# Patient Record
Sex: Female | Born: 1950
Health system: Southern US, Community
[De-identification: ages and names within clinical notes are randomized; demographics above are authoritative.]

## PROBLEM LIST (undated history)

## (undated) DIAGNOSIS — C801 Malignant (primary) neoplasm, unspecified: Secondary | ICD-10-CM

## (undated) DIAGNOSIS — S129XXA Fracture of neck, unspecified, initial encounter: Secondary | ICD-10-CM

## (undated) DIAGNOSIS — E785 Hyperlipidemia, unspecified: Secondary | ICD-10-CM

## (undated) DIAGNOSIS — F32A Depression, unspecified: Secondary | ICD-10-CM

## (undated) DIAGNOSIS — L509 Urticaria, unspecified: Secondary | ICD-10-CM

## (undated) DIAGNOSIS — N200 Calculus of kidney: Secondary | ICD-10-CM

## (undated) DIAGNOSIS — L02239 Carbuncle of trunk, unspecified: Secondary | ICD-10-CM

## (undated) DIAGNOSIS — T148XXA Other injury of unspecified body region, initial encounter: Secondary | ICD-10-CM

## (undated) DIAGNOSIS — Z8619 Personal history of other infectious and parasitic diseases: Secondary | ICD-10-CM

## (undated) DIAGNOSIS — E7800 Pure hypercholesterolemia, unspecified: Secondary | ICD-10-CM

## (undated) DIAGNOSIS — R000 Tachycardia, unspecified: Secondary | ICD-10-CM

## (undated) DIAGNOSIS — N390 Urinary tract infection, site not specified: Secondary | ICD-10-CM

## (undated) DIAGNOSIS — Z9889 Other specified postprocedural states: Secondary | ICD-10-CM

## (undated) DIAGNOSIS — J45909 Unspecified asthma, uncomplicated: Secondary | ICD-10-CM

## (undated) DIAGNOSIS — I10 Essential (primary) hypertension: Secondary | ICD-10-CM

## (undated) DIAGNOSIS — G4733 Obstructive sleep apnea (adult) (pediatric): Secondary | ICD-10-CM

## (undated) DIAGNOSIS — Z87442 Personal history of urinary calculi: Secondary | ICD-10-CM

## (undated) DIAGNOSIS — M1990 Unspecified osteoarthritis, unspecified site: Secondary | ICD-10-CM

## (undated) DIAGNOSIS — D649 Anemia, unspecified: Secondary | ICD-10-CM

## (undated) DIAGNOSIS — M5430 Sciatica, unspecified side: Secondary | ICD-10-CM

## (undated) DIAGNOSIS — L02229 Furuncle of trunk, unspecified: Secondary | ICD-10-CM

## (undated) DIAGNOSIS — R011 Cardiac murmur, unspecified: Secondary | ICD-10-CM

## (undated) DIAGNOSIS — E119 Type 2 diabetes mellitus without complications: Secondary | ICD-10-CM

## (undated) DIAGNOSIS — I499 Cardiac arrhythmia, unspecified: Secondary | ICD-10-CM

## (undated) DIAGNOSIS — G629 Polyneuropathy, unspecified: Secondary | ICD-10-CM

## (undated) DIAGNOSIS — M24411 Recurrent dislocation, right shoulder: Secondary | ICD-10-CM

## (undated) DIAGNOSIS — R350 Frequency of micturition: Secondary | ICD-10-CM

## (undated) DIAGNOSIS — R52 Pain, unspecified: Secondary | ICD-10-CM

## (undated) DIAGNOSIS — E039 Hypothyroidism, unspecified: Secondary | ICD-10-CM

## (undated) DIAGNOSIS — M797 Fibromyalgia: Secondary | ICD-10-CM

## (undated) DIAGNOSIS — E0500 Thyrotoxicosis with diffuse goiter without thyrotoxic crisis or storm: Secondary | ICD-10-CM

## (undated) DIAGNOSIS — F329 Major depressive disorder, single episode, unspecified: Secondary | ICD-10-CM

## (undated) DIAGNOSIS — J189 Pneumonia, unspecified organism: Secondary | ICD-10-CM

## (undated) DIAGNOSIS — G2581 Restless legs syndrome: Secondary | ICD-10-CM

## (undated) DIAGNOSIS — E669 Obesity, unspecified: Secondary | ICD-10-CM

## (undated) DIAGNOSIS — R112 Nausea with vomiting, unspecified: Secondary | ICD-10-CM

## (undated) DIAGNOSIS — G43909 Migraine, unspecified, not intractable, without status migrainosus: Secondary | ICD-10-CM

## (undated) DIAGNOSIS — M629 Disorder of muscle, unspecified: Secondary | ICD-10-CM

## (undated) DIAGNOSIS — G510 Bell's palsy: Secondary | ICD-10-CM

## (undated) HISTORY — DX: Essential (primary) hypertension: I10

## (undated) HISTORY — PX: CARPAL TUNNEL RELEASE: SHX101

## (undated) HISTORY — DX: Hypothyroidism, unspecified: E03.9

## (undated) HISTORY — PX: EYELID LACERATION REPAIR: SHX1564

## (undated) HISTORY — PX: JOINT REPLACEMENT: SHX530

## (undated) HISTORY — PX: ABDOMINAL HYSTERECTOMY: SHX81

## (undated) HISTORY — DX: Migraine, unspecified, not intractable, without status migrainosus: G43.909

## (undated) HISTORY — PX: APPENDECTOMY: SHX54

## (undated) HISTORY — DX: Tachycardia, unspecified: R00.0

## (undated) HISTORY — DX: Urinary tract infection, site not specified: N39.0

## (undated) HISTORY — DX: Fibromyalgia: M79.7

## (undated) HISTORY — PX: OTHER SURGICAL HISTORY: SHX169

## (undated) HISTORY — PX: KNEE ARTHROSCOPY: SUR90

## (undated) HISTORY — PX: TONSILLECTOMY: SUR1361

## (undated) HISTORY — DX: Type 2 diabetes mellitus without complications: E11.9

## (undated) HISTORY — DX: Furuncle of trunk, unspecified: L02.229

## (undated) HISTORY — DX: Urticaria, unspecified: L50.9

## (undated) HISTORY — PX: CATARACT EXTRACTION W/ INTRAOCULAR LENS  IMPLANT, BILATERAL: SHX1307

## (undated) HISTORY — DX: Obesity, unspecified: E66.9

## (undated) HISTORY — PX: HERNIA REPAIR: SHX51

## (undated) HISTORY — DX: Carbuncle of trunk, unspecified: L02.239

## (undated) HISTORY — DX: Anemia, unspecified: D64.9

## (undated) HISTORY — DX: Hyperlipidemia, unspecified: E78.5

## (undated) HISTORY — DX: Calculus of kidney: N20.0

## (undated) HISTORY — DX: Bell's palsy: G51.0

## (undated) HISTORY — DX: Obstructive sleep apnea (adult) (pediatric): G47.33

## (undated) HISTORY — PX: CHOLECYSTECTOMY: SHX55

---

## 1997-10-31 ENCOUNTER — Inpatient Hospital Stay (HOSPITAL_COMMUNITY): Admission: RE | Admit: 1997-10-31 | Discharge: 1997-11-05 | Payer: Self-pay | Admitting: General Surgery

## 1998-08-22 ENCOUNTER — Other Ambulatory Visit: Admission: RE | Admit: 1998-08-22 | Discharge: 1998-08-22 | Payer: Self-pay | Admitting: *Deleted

## 1999-11-03 ENCOUNTER — Other Ambulatory Visit: Admission: RE | Admit: 1999-11-03 | Discharge: 1999-11-03 | Payer: Self-pay | Admitting: *Deleted

## 2000-10-10 ENCOUNTER — Ambulatory Visit (HOSPITAL_BASED_OUTPATIENT_CLINIC_OR_DEPARTMENT_OTHER): Admission: RE | Admit: 2000-10-10 | Discharge: 2000-10-10 | Payer: Self-pay | Admitting: Otolaryngology

## 2001-05-09 ENCOUNTER — Other Ambulatory Visit: Admission: RE | Admit: 2001-05-09 | Discharge: 2001-05-09 | Payer: Self-pay | Admitting: *Deleted

## 2001-06-26 ENCOUNTER — Encounter: Admission: RE | Admit: 2001-06-26 | Discharge: 2001-09-24 | Payer: Self-pay | Admitting: Internal Medicine

## 2002-05-28 ENCOUNTER — Other Ambulatory Visit: Admission: RE | Admit: 2002-05-28 | Discharge: 2002-05-28 | Payer: Self-pay | Admitting: *Deleted

## 2003-06-21 ENCOUNTER — Other Ambulatory Visit: Admission: RE | Admit: 2003-06-21 | Discharge: 2003-06-21 | Payer: Self-pay | Admitting: Obstetrics & Gynecology

## 2006-01-18 ENCOUNTER — Inpatient Hospital Stay (HOSPITAL_COMMUNITY): Admission: RE | Admit: 2006-01-18 | Discharge: 2006-01-22 | Payer: Self-pay | Admitting: Orthopaedic Surgery

## 2006-02-15 ENCOUNTER — Encounter: Admission: RE | Admit: 2006-02-15 | Discharge: 2006-02-15 | Payer: Self-pay | Admitting: Orthopedic Surgery

## 2007-11-16 ENCOUNTER — Encounter: Admission: RE | Admit: 2007-11-16 | Discharge: 2007-11-16 | Payer: Self-pay | Admitting: Orthopedic Surgery

## 2007-12-21 ENCOUNTER — Ambulatory Visit (HOSPITAL_COMMUNITY): Admission: RE | Admit: 2007-12-21 | Discharge: 2007-12-21 | Payer: Self-pay | Admitting: Orthopaedic Surgery

## 2007-12-25 ENCOUNTER — Ambulatory Visit (HOSPITAL_COMMUNITY): Admission: AD | Admit: 2007-12-25 | Discharge: 2007-12-28 | Payer: Self-pay | Admitting: Orthopaedic Surgery

## 2007-12-26 ENCOUNTER — Ambulatory Visit: Payer: Self-pay | Admitting: Infectious Diseases

## 2008-01-25 ENCOUNTER — Ambulatory Visit: Payer: Self-pay | Admitting: Infectious Diseases

## 2008-01-25 ENCOUNTER — Telehealth: Payer: Self-pay | Admitting: Infectious Diseases

## 2008-01-25 DIAGNOSIS — M009 Pyogenic arthritis, unspecified: Secondary | ICD-10-CM | POA: Insufficient documentation

## 2008-01-25 DIAGNOSIS — A4901 Methicillin susceptible Staphylococcus aureus infection, unspecified site: Secondary | ICD-10-CM | POA: Insufficient documentation

## 2008-01-25 LAB — CONVERTED CEMR LAB
CRP: 3.4 mg/dL — ABNORMAL HIGH (ref <0.6)
Sed Rate: 133 mm/hr — ABNORMAL HIGH (ref 0–22)

## 2008-01-30 ENCOUNTER — Telehealth (INDEPENDENT_AMBULATORY_CARE_PROVIDER_SITE_OTHER): Payer: Self-pay | Admitting: *Deleted

## 2008-02-27 ENCOUNTER — Ambulatory Visit: Payer: Self-pay | Admitting: Infectious Diseases

## 2008-02-27 LAB — CONVERTED CEMR LAB
Basophils Absolute: 0 10*3/uL (ref 0.0–0.1)
Basophils Relative: 0 % (ref 0–1)
CRP: 0.4 mg/dL (ref <0.6)
Eosinophils Absolute: 0 10*3/uL (ref 0.0–0.7)
Eosinophils Relative: 0 % (ref 0–5)
HCT: 35.4 % — ABNORMAL LOW (ref 36.0–46.0)
Hemoglobin: 11.2 g/dL — ABNORMAL LOW (ref 12.0–15.0)
Lymphocytes Relative: 45 % (ref 12–46)
Lymphs Abs: 1.8 10*3/uL (ref 0.7–4.0)
MCHC: 31.6 g/dL (ref 30.0–36.0)
MCV: 83.3 fL (ref 78.0–100.0)
Monocytes Absolute: 0.4 10*3/uL (ref 0.1–1.0)
Monocytes Relative: 11 % (ref 3–12)
Neutro Abs: 1.8 10*3/uL (ref 1.7–7.7)
Neutrophils Relative %: 44 % (ref 43–77)
Platelets: 253 10*3/uL (ref 150–400)
RBC: 4.25 M/uL (ref 3.87–5.11)
RDW: 14.2 % (ref 11.5–15.5)
Sed Rate: 23 mm/hr — ABNORMAL HIGH (ref 0–22)
WBC: 4 10*3/uL (ref 4.0–10.5)

## 2008-04-30 ENCOUNTER — Ambulatory Visit: Payer: Self-pay | Admitting: Infectious Diseases

## 2008-08-29 ENCOUNTER — Ambulatory Visit: Payer: Self-pay | Admitting: Internal Medicine

## 2008-10-02 ENCOUNTER — Ambulatory Visit: Payer: Self-pay | Admitting: Gastroenterology

## 2008-10-02 DIAGNOSIS — R197 Diarrhea, unspecified: Secondary | ICD-10-CM | POA: Insufficient documentation

## 2008-10-02 DIAGNOSIS — R195 Other fecal abnormalities: Secondary | ICD-10-CM | POA: Insufficient documentation

## 2008-10-14 ENCOUNTER — Telehealth: Payer: Self-pay | Admitting: Gastroenterology

## 2008-10-17 ENCOUNTER — Ambulatory Visit (HOSPITAL_COMMUNITY): Admission: RE | Admit: 2008-10-17 | Discharge: 2008-10-17 | Payer: Self-pay | Admitting: Gastroenterology

## 2008-10-17 ENCOUNTER — Ambulatory Visit: Payer: Self-pay | Admitting: Gastroenterology

## 2009-02-18 ENCOUNTER — Encounter: Admission: RE | Admit: 2009-02-18 | Discharge: 2009-03-19 | Payer: Self-pay | Admitting: Internal Medicine

## 2010-04-21 NOTE — Miscellaneous (Signed)
Summary: [facility] PV  Clinical Lists Changes  Pt came for PV today.  She has had multiple abdominal surgeries.  Pt will need office visit with Dr. Christella Hartigan to determine if she should have colonoscopy at [facility].

## 2010-04-21 NOTE — Miscellaneous (Signed)
Summary: HIPAA Restrictions  HIPAA Restrictions   Imported By: Florinda Marker 01/25/2008 14:17:51  _____________________________________________________________________  External Attachment:    Type:   Image     Comment:   External Document

## 2010-04-21 NOTE — Procedures (Signed)
Summary: Colon prep/[facility]  Colon prep/[facility]   Imported By: Lester Morton 10/08/2008 10:35:45  _____________________________________________________________________  External Attachment:    Type:   Image     Comment:   External Document

## 2010-04-21 NOTE — Procedures (Signed)
Summary: Colonoscopy   Colonoscopy  Procedure date:  10/17/2008  Findings:      Location:  [facility].   COLONOSCOPY PROCEDURE REPORT  PATIENT:  [name], [name]  MR#:  [id number] BIRTHDATE:   December 22, 1950, 58 yrs. old   GENDER:   female ENDOSCOPIST:   Rachael Fee, MD Referred by: Rodrigo Ran, M.D. PROCEDURE DATE:  10/17/2008 PROCEDURE:  Colonoscopy, diagnostic ASA CLASS:   Class III INDICATIONS: FOBT positive stool  MEDICATIONS:    MAC sedation, administered by CRNA  DESCRIPTION OF PROCEDURE:   After the risks benefits and alternatives of the procedure were thoroughly explained, informed consent was obtained.  Digital rectal exam was performed and revealed no rectal masses.   The EC-[id number] ([id number]) endoscope was introduced through the anus and advanced to the cecum, which was identified by both the appendix and ileocecal valve, without limitations.  The quality of the prep was excellent, using MoviPrep.  The instrument was then slowly withdrawn as the colon was fully examined. <<PROCEDUREIMAGES>>  <<OLD IMAGES>>  FINDINGS:  A normal appearing cecum, ileocecal valve, and appendiceal orifice were identified. The ascending, hepatic flexure, transverse, splenic flexure, descending, sigmoid colon, and rectum appeared unremarkable (see image001 and image002).   Retroflexed views in the rectum revealed no abnormalities.    The scope was then withdrawn from the patient and the procedure completed.  COMPLICATIONS:   None  ENDOSCOPIC IMPRESSION:  1) Normal colon 2) No polyps or cancers  RECOMMENDATIONS:  1) You should continue follow current colorectal cancer screening guidelines for "routine risk" patients with a repeat colonoscopy in 10 years. I do not recommend other colon cancer screening prior to then (including stool tests for microscopic blood) unless new symptoms arise.   _______________________________ Rachael Fee, MD

## 2010-04-21 NOTE — Assessment & Plan Note (Signed)
Summary: hsfu shoulder infection need chrt   Chief Complaint:  hsfu.  History of Present Illness: 60 yo with h/o R TKR who underwent L knee arthroscopy then developed septic knee with MSSA> 10/1 had otpt arthroscopy then developed fevers and knee pain after 2 days. 10/5 I and D of knee that grew MSSA. D/c 10/7on ancef  Saw Dalldorf 1 wk ago and he thinks her leg is doing ok  but there is still some fluid on it.  No tap done. She thtinks her leg has good and bad days.  Is walking on it.  Much less swollen and much less painful since surgery.  No fevers chills    Updated Prior Medication List: TOPROL XL 100 MG XR24H-TAB (METOPROLOL SUCCINATE) 1 qd TRICOR 145 MG TABS (FENOFIBRATE) 1 qd SYNTHROID 75 MCG TABS (LEVOTHYROXINE SODIUM) 1 once daily METFORMIN HCL 1000 MG TABS (METFORMIN HCL) 1bid BYETTA 10 MCG PEN 10 MCG/0.04ML SOLN (EXENATIDE) 1 once daily ancef 1 gm q8 hrs  Current Allergies (reviewed today): ! CODEINE ! MORPHINE ! SULFA ! PHENERGAN  Past Medical History:    Septic L knee post arthroscopy    R TKR    OA    hyperlipidemai    htn    dm   Family History:    [place]  Social History:    no tob etoh drugs   Risk Factors:  Tobacco use:  never   Review of Systems       11 systems reviewed and negative except per HPI    Vital Signs:  Patient Profile:   60 Years Old Female Height:     61 inches (154.94 cm) Weight:      213.6 pounds (97.09 kg) BMI:     40.51 Temp:     98.3 degrees F (36.83 degrees C) oral Pulse rate:   84 / minute BP sitting:   149 / 80  (left arm)  Pt. in pain?   yes    Location:   knee    Intensity:   8    Type:       aching  Vitals Entered By: Starleen Arms CMA (January 25, 2008 11:44 AM)              Is Patient Diabetic? Yes Did you bring your meter with you today? No Nutritional Status BMI of > 30 = obese  Does patient need assistance? Functional Status Cook/clean, Shopping, Social activities  Ambulation Impaired:Risk for fall, Wheelchair     Physical Exam  General:     alert, well-developed, and well-nourished.   Mouth:     fair dentition.   Lungs:     normal respiratory effort and normal breath sounds.   Heart:     normal rate, regular rhythm, and no murmur.   Abdomen:     soft and non-tender.   Msk:     L knee has 2 well healed incision sites.  It is somewhat warm and has a small effusion but there is no tenderness to palpation or passive ROM Extremities:     no cce Neurologic:     alert & oriented X3 and cranial nerves II-XII intact.   Skin:     no rashes.      Impression & Recommendations:  Problem # 1:  PYOGENIC ARTHRITIS, LOWER LEG (ICD-711.06) She is now 4 wks s/p I and D and hascompleted 4 wks of IV ancef.  Her knee appears relatively benign and is non tender although there  is some small amount of effusion.  SHe has no systemic sxs.  She thinks her muscle aches in her leg are due to her PT activities.   Today we checked esr crp and the ESR was markedly elevated at 133 but crp only mildly elevated at 3.  Unfortunately there is no prior esr or crp dine.  I would liek to repeat the esr and crp by advance at home.   I think it would be best at this point to stop IV ancef and give tail of keflex for at least 2 wks.  Will d/w advance to pull the picc and I have given her an rx for keflex.  SHe will see orhto next week and then f/u with me  in 4 wks.  If effusion worsens would suggest drainage by ortho. Her updated medication list for this problem includes:    Keflex 750 Mg Caps (Cephalexin) .Marland Kitchen... Take one by mouth 4 times a day for 2 wks    Medications Added to Medication List This Visit: 1)  Toprol Xl 100 Mg Xr24h-tab (Metoprolol succinate) .Marland Kitchen.. 1 qd 2)  Tricor 145 Mg Tabs (Fenofibrate) .Marland Kitchen.. 1 qd 3)  Synthroid 75 Mcg Tabs (Levothyroxine sodium) .Marland Kitchen.. 1 once daily 4)  Metformin Hcl 1000 Mg Tabs (Metformin hcl) .Marland Kitchen.. 1bid  5)  Byetta 10 Mcg Pen 10 Mcg/0.26ml Soln (Exenatide) .Marland Kitchen.. 1 once daily 6)  Keflex 750 Mg Caps (Cephalexin) .... Take one by mouth 4 times a day for 2 wks  Other Orders: T-C-Reactive Protein ([id number]) T-Sed Rate (Automated) ([id number])   Patient Instructions: 1)  Please schedule a follow-up appointment in 4 weeks.   Prescriptions: KEFLEX 750 MG CAPS (CEPHALEXIN) take one by mouth 4 times a day for 2 wks  #56 x 1   Entered and Authorized by:   Clydie Braun MD   Signed by:   Clydie Braun MD on 01/25/2008   Method used:   Print then Give to Patient   RxID:   [id number]  ]

## 2010-04-21 NOTE — Progress Notes (Signed)
Summary: OK to stop IV ABX and pull PIC?  call [facility]  Phone Note From Pharmacy   Caller: Melissa @ [facility] [phone] Call For: Dr. Jean Rosenthal  Summary of Call: Dr. Sampson Goon, last day of IV antibiotic 01/25/08.  OK to stop antibiotic and pull PIC line?  Please return call to [phone].  Jennet Maduro RN  January 25, 2008 3:38 PM   Follow-up for Phone Call        Pharmacist called.  order given to d/c ancef and start  keflex.  Can pull  after checking esr crp Follow-up by: Clydie Braun MD,  January 26, 2008 9:55 AM

## 2010-04-21 NOTE — Progress Notes (Signed)
Summary: ESR and CBC to be drawn by [facility] today, PIC pulled 11//9/09  Phone Note Other Incoming   Call placed by: Kriste Basque, RN @ [facility] Call placed to: clinic Reason for Call: Discuss lab or test results Summary of Call: [facility] line was pulled yesterday without drawing ESR and CBC.  These will be drawn peripherally today.  Jennet Maduro RN  January 30, 2008 9:25 AM

## 2010-04-21 NOTE — Miscellaneous (Signed)
Summary: Waiver of Liability/[employer], industrial/product of Liability/[facility]   Imported By: Lester Palouse 10/08/2008 10:37:49  _____________________________________________________________________  External Attachment:    Type:   Image     Comment:   External Document

## 2010-06-28 LAB — GLUCOSE, CAPILLARY: Glucose-Capillary: 205 mg/dL — ABNORMAL HIGH (ref 70–99)

## 2010-08-04 NOTE — Op Note (Signed)
NAMECHARDONAY, SCRITCHFIELD                ACCOUNT NO.:  [id number]   MEDICAL RECORD NO.:  [id number]          PATIENT TYPE:  OIB   LOCATION:  5011                         FACILITY:  [facility]   PHYSICIAN:  Lubertha Basque. Dalldorf, M.D.DATE OF BIRTH:  [birth year]   DATE OF PROCEDURE:  12/25/2007  DATE OF DISCHARGE:                               OPERATIVE REPORT   PREOPERATIVE DIAGNOSIS:  Left knee infection.   POSTOPERATIVE DIAGNOSIS:  Left knee infection.   PROCEDURE:  Left knee arthroscopic incision and drainage.   ANESTHESIA:  General.   ATTENDING SURGEON:  Lubertha Basque. Jerl Santos, MD.   ASSISTANTLindwood Qua, PA   INDICATIONS FOR PROCEDURE:  The patient is a 60 year old woman about a  week from a left knee arthroscopy.  She is also diabetic.  She presented  to the office today with some increased pain and swelling.  We performed  an aspiration in the office, which returned some findings concerning for  infection.  She is offered arthroscopic I&D at this point.  Informed  operative consent was obtained after discussion of possible  complications of reaction to anesthesia and continued infection.   SUMMARY FINDINGS AND PROCEDURE:  Under general anesthesia, an  arthroscopic I&D was performed of the left knee.  She had some  significant pus, which we drained and again sent to the lab prior to  administering some perioperative IV vancomycin.  This was fully drained  followed by irrigation of the knee and placement of drains.   DESCRIPTION OF THE PROCEDURE:  The patient was in the operating suite  where general anesthetic was applied without difficulty.  She was  positioned supine and prepped and draped in normal sterile fashion.  We  used her old arthroscopic portals medial and lateral and placed the  arthroscopic trocar.  Some pus was drained and this was sent to  pathology for STAT Gram stain and cultures.  We then administered IV  vancomycin.  An arthroscopy of the knee was performed with a  thorough  irrigation.  She had the same findings as last week with some moderate  degenerative change.  Her meniscectomies looked stable.  We passed 6  liters of sterile saline through the knee followed by a bag of the  triple antibiotic solution.  I then placed large Hemovac drains medial  and lateral and sutured these in place.  An adaptic was applied to the  drain sites followed by dry gauze and loose Ace wrap.  Estimated blood  loss and intraoperative fluids obtained from anesthesia records.   DISPOSITION:  The patient was extubated in the operating room and taken  to the recovery room in stable addition.  She will be admitted to our  service for appropriate postop care to include antibiotic and pain  medication.  We will consult Infectious Disease about appropriate  antibiotic management for this will definitely involve some vancomycin.  We will have a PICC line placed in some point during this stay.      Lubertha Basque Jerl Santos, M.D.  Electronically Signed     PGD/MEDQ  D:  12/25/2007  T:  12/26/2007  Job:  [id number]

## 2010-08-04 NOTE — Op Note (Signed)
[name], [name]                ACCOUNT NO.:  [id number]   MEDICAL RECORD NO.:  [id number]          PATIENT TYPE:  AMB   LOCATION:  SDS                          FACILITY:  [facility]   PHYSICIAN:  Lubertha Basque. Dalldorf, M.D.DATE OF BIRTH:  1950-07-10   DATE OF PROCEDURE:  12/21/2007  DATE OF DISCHARGE:                               OPERATIVE REPORT   PREOPERATIVE DIAGNOSES:  1. Right shoulder rotator cuff tear.  2. Left knee chondromalacia patella.  3. Left knee torn lateral meniscus.   POSTOPERATIVE DIAGNOSES:  1. Right shoulder rotator cuff tear.  2. Left knee chondromalacia patella.  3. Left knee torn lateral meniscus.   PROCEDURES:  1. Right shoulder arthroscopic debridement.  2. Left knee partial lateral meniscectomy.  3. Left knee abrasion chondroplasty patellofemoral.   ANESTHESIA:  General and shoulder block.   ATTENDING SURGEON:  Lubertha Basque. Jerl Santos, MD   ASSISTANT:  Lindwood Qua, PA   INDICATION FOR PROCEDURE:  The patient is a 60 year old woman with a  long history of right shoulder and to a lesser degree left knee pain.  She is about 3 years from rotator cuff repair on the right.  This has  gone bad over the last 6 months.  She has failed several subacromial  injections and a therapy program.  By MRI scan, she has a new rotator  cuff tear.  She is offered repair.  She has also had trouble with the  knee despite injections and pills.  We have agreed to perform an  arthroscopy there at the same sitting.  Informed operative consent was  obtained after discussion of possible complications of reaction to  anesthesia and infection.   SUMMARY/FINDINGS/PROCEDURE:  Under general anesthesia and a block, a  right shoulder arthroscopy was performed.  She had a very large  retracted rotator cuff tear with poor tissues.  We elected to debride  this back to the level of the glenoid.  She had no new bony findings and  appeared to have adequate decompression, so no additional  bony work was  done.  She also has degenerative changes across the humeral head, grade  4 in small areas.  At the knee, we found a posterior horn lateral  meniscus addressed with 5% partial lateral meniscectomy.  She also has  advanced degenerative change of patellofemoral, grade 4.  A  chondroplasty was done here with abrasion to bleeding bone in some tiny  areas.   DESCRIPTION OF PROCEDURE:  The patient was taken to operating suite  where a general anesthetic was applied with moderate difficulty under  scope guidance.  She was then positioned in a beach chair position and  prepped and draped in normal sterile fashion.  Should be noted that she  had a regional block applied to the right arm in the preanesthesia area.  After the administration of IV Kefzol, an arthroscopy of the right  shoulder was performed through a total of 3 portals.  Findings were as  noted above.  There were some early degenerative changes in broad areas  and some advanced change on the humeral head in  small area.  The biceps  tendon appeared intact.  Rotator cuff was torn and retracted and the  tissues were not very viable.  We elected to debride the level of  glenoid, so we did leave the anterior and posterior sleeves of tissue.  No additional bony work was required.  The shoulder was thoroughly  irrigated followed by reapproximation of portals loosely with nylon.  Adaptic was applied followed by dry gauze and tape.  We then turned our  attention to the knee.  This had already had been prepped and draped at  the beginning of the case.  We performed an arthroscopy through a total  of 2 portals.  Findings were as noted above.  Procedure consisted of  abrasion chondroplasty patellofemoral with a partial lateral  meniscectomy done, which shaver removing about 5% of this tissue to a  stable rim.  The medial compartment was benign.  This knee was irrigated  followed by placement of some Marcaine with epinephrine and  morphine.  Adaptic was placed over the portals followed by dry gauze and a loose  Ace wrap.  Estimated blood loss and intraoperative fluids obtained from  the anesthesia records.   DISPOSITION:  The patient was extubated in the operating room and taken  to recovery room in stable addition.  She is to go home same-day and to  follow up in the office next week.      Lubertha Basque Jerl Santos, M.D.  Electronically Signed     PGD/MEDQ  D:  12/21/2007  T:  12/22/2007  Job:  [id number]

## 2010-08-07 NOTE — Discharge Summary (Signed)
NAMEEVERLEY, EVORA                ACCOUNT NO.:  [id number]   MEDICAL RECORD NO.:  [id number]          PATIENT TYPE:  INP   LOCATION:  5011                         FACILITY:  [facility]   PHYSICIAN:  Lubertha Basque. Dalldorf, M.D.DATE OF BIRTH:  1950-06-20   DATE OF ADMISSION:  12/25/2007  DATE OF DISCHARGE:  12/28/2007                               DISCHARGE SUMMARY   ADMITTING DIAGNOSES:  1. Infected left knee status post arthroscopy, December 21, 2007.  2. Diabetes mellitus.  3. Obesity.  4. Hypertension.   DISCHARGE DIAGNOSES:  1. Infected left knee status post arthroscopy, December 21, 2007.  2. Diabetes mellitus.  3. Obesity.  4. Hypertension.   OPERATION:  Incision and drainage, left knee for infection.   BRIEF HISTORY:  Ms. Malkowski is a 60 year old white female patient well-  known to our practice who prior to this admission had undergone a knee  arthroscopy and shoulder arthroscopy on December 21, 2007.  The left knee  postoperatively after the scope began to develop increasing redness and  discomfort which are in the office aspirated.  It was noted to be  infected at that time and she was admitted to the hospital for I and D,  and then consultation from Infectious Disease.   PERTINENT LABORATORY DATA AND X-RAY FINDINGS:  There was a chest x-ray  that was done for displacement of the PICC line.  WBCs 8.1, hemoglobin  11.1, and platelets 230.  Sodium 131, potassium 3.1, glucose 198 which  did fluctuate, BUN 5, and creatinine 0.4.  Hemoglobin A1c 8.1.  Gram-  stain showed rare Gram-positive cocci in pairs.  Moderate staph aureus  was her culture.  Sensitivities per the chart.   COURSE IN THE HOSPITAL:  She was admitted and taken to the operating  room for I and D postoperatively.  An Infectious Disease consult was  done by Dr. Darlina Sicilian and put her on the appropriate antibiotics.  Postoperatively, she had decreasing temperature with back to the normal  range.  We left 2 large-bore drains  in her knee for 2-3 days postop.  They were removed.  Blood sugars were monitored and treated accordingly  and a PICC line was put in for IV antibiotics which we processed for  approximately 4 weeks and then later p.o.  Foley catheter was  discontinued, dressing was changed, drains were removed, wound was noted  to be benign, and she progressed and was discharged home.   CONDITION ON DISCHARGE:  Improved.   FOLLOWUP:  She will be on a low-sodium heart-healthy diet.  May  weightbear as tolerated, crutches or walker.  Change her dressing daily.  Return to Dr. Jerl Santos in 7 days.  She will be on an 81 mg aspirin, IV  home Ancef  protocol as regulated by Infectious Disease.  Any sign of increasing  infection, redness, or temperature elevation is to call.  She was also  kept on her home medication list which includes Byetta, metformin,  hormone replacement, metoprolol, Synthroid, Niaspan, Vytorin, generic  Prozac, and oxycodone for pain.      Lindwood Qua, P.A.  Lubertha Basque Jerl Santos, M.D.  Electronically Signed    MC/MEDQ  D:  02/01/2008  T:  02/01/2008  Job:  [id number]

## 2010-08-07 NOTE — Discharge Summary (Signed)
[name], [name]                ACCOUNT NO.:  [id number]   MEDICAL RECORD NO.:  [id number]          PATIENT TYPE:  INP   LOCATION:  5025                         FACILITY:  [facility]   PHYSICIAN:  Lubertha Basque. Dalldorf, M.D.DATE OF BIRTH:  Feb 05, 1951   DATE OF ADMISSION:  01/18/2006  DATE OF DISCHARGE:  01/22/2006                               DISCHARGE SUMMARY   ADMITTING DIAGNOSES:  1. Right knee end-stage degenerative joint disease.  2. Non-insulin-dependent diabetes mellitus.  3. Hypothyroidism.  4. History of tachycardia.  5. Diagnosis of hypertension.  6. Esophageal reflux.   DISCHARGE DIAGNOSIS:  1. Right knee end-stage degenerative joint disease.  2. Noninsulin-dependent diabetes mellitus.  3. Hypothyroidism.  4. History of tachycardia.  5. Diagnosis of hypertension.  6. Esophageal reflux.   OPERATIONS:  Right total knee replacement.   BRIEF HISTORY:  This is a 60 year old female well-known to our practice  complaining of increasing right knee pain, now pain with every step,  trouble sleeping at nighttime.  X-rays reveal end-stage DJD in multiple  compartments.  She has also failed oral anti-inflammatory medications  and multiple corticosteroid injections and Synvisc injections.  Discussed treatment options with her, that being total knee replacement  with the risks of anesthesia, infection, DVT and possible death.   PERTINENT LABORATORY AND X-RAY FINDINGS:  WBC is 5.7, hemoglobin 11.2,  hematocrit 31.8, platelets 218.  Serial INRs were performed, as she was  on low-dose Coumadin protocol.  Sodium 139, potassium 3.5, glucose 153,  BUN 4, creatinine 0.5.  Hemoglobin A1c of 6.4 postoperatively.   Postoperatively she was kept on her home medications which will be  outlined at the end of this report.  She was given an IV of lactated  Ringer's, IV Ancef a gram q.8h. x3 doses, Coumadin and Lovenox protocol  as per pharmacy for DVT prophylaxis.  CPM machine was used, 0-50,  advance as tolerated.  Incentive spirometry, knee-high TEDs.  Physical  therapy to be out of bed and weightbearing as tolerated, as well.   The first day postoperatively, her blood pressure was 93/53, temperature  was 98.  Lungs were clear.  Abdomen was soft.  Hemoglobin 11.8, glucose  130.  The dressing was dry.  The wound was benign.  Physical therapy was  started to help her with out-of- bed weightbearing as tolerated.   The second day postoperatively, her drains were pulled and her dressing  was changed.  Her wound was benign without sign of infection.  Lungs  continued to be clear.  She did spike a temperature to 101.  The  pharmacy was regulating Coumadin protocol along with Lovenox.   Day three postoperatively, she was progressing well with physical  therapy and was able to be discharged on January 22, 2006.   CONDITION ON DISCHARGE:  Improved.   DISCHARGE MEDICATIONS:  1. She will be on a Coumadin dose regulated by pharmacy for 3 weeks.  2. Percocet for pain one or two every 4-6 hours.  3. She will continue on Synthroid 0.0075 one a day.  4. Vytorin 10/20 one a day.  5. Florice  5000 daily.  6. Her hormone replacement therapy 1 mg which is estradiol 1 mg daily.  7. Cyclobenzaprine as needed for spasm.  8. Fluoxetine 10 mg at night.  9. Prilosec 20 one at night time.  10.Clonazepam 0.5 p.r.n.  (Naprosyn discontinued.)   DIET:  The diabetic diet that she was on prior to coming to the  hospital.   WOUND CARE:  May change her dressing on a daily basis.  Any sign of  infection to call our office at [phone].   [facility] is arranging home therapy, as well as home pro times-  INRs and Coumadin regulation.  Any sign of infection to call the office,  will see her in 10 days, [phone].      [name], P.A.      Lubertha Basque Jerl Santos, M.D.  Electronically Signed    MC/MEDQ  D:  03/01/2006  T:  03/01/2006  Job:  [id number]

## 2010-08-07 NOTE — Op Note (Signed)
[name], [name]                ACCOUNT NO.:  [id number]   MEDICAL RECORD NO.:  [id number]          PATIENT TYPE:  INP   LOCATION:  2899                         FACILITY:  [facility]   PHYSICIAN:  Lubertha Basque. Dalldorf, M.D.DATE OF BIRTH:  03-Sep-1950   DATE OF PROCEDURE:  01/18/2006  DATE OF DISCHARGE:                                 OPERATIVE REPORT   PREOPERATIVE DIAGNOSIS:  Right knee degenerative arthritis.   POSTOPERATIVE DIAGNOSIS:  Right knee degenerative arthritis.   PROCEDURE:  Right total knee replacement.   ANESTHESIA:  General and block.   SURGEON:  Lubertha Basque. Jerl Santos, M.D.   ASSISTANT:  Lindwood Qua, P.A.   INDICATIONS FOR PROCEDURE:  The patient is a 60 year old woman with a long  history of bilateral knee arthritis.  On the right she is status post an  arthroscopy some time ago, which revealed advanced degenerative change.  She  has failed all oral anti-inflammatories and injectables, and has pain which  limits her ability to walk and rest.  She is offered a knee replacement.  Informed operative consent was obtained after a discussion of the possible  complications of, reaction to anesthesia, infection, DVT, PE and death.  The  importance of rehabilitation protocol after this type of surgery was also  stressed with the patient.   SUMMARY/FINDINGS:  Under general anesthesia and a block, through an anterior  longitudinal incision, a knee replacement was performed.  She had good bone  quality and advanced degenerative change in all three compartments.  She had  a mild varus deformity.  This was addressed with a DePuy system.  On the  tibia we placed a 2.5 MBT revision-type stem due to her weight.  On the  femur we placed a standard component and on the patella we placed a size 35  all polyethylene component.  The spacer was the 12.5 deep dish variety.  I  included antibiotic in the cement.  Bryna Colander, P.A. assisted throughout  and was invaluable to the completion  of the case, in that he helped retract  and position, while I performed the operation.  He also closed  simultaneously, to help minimize OR time.   DESCRIPTION OF PROCEDURE:  The patient was taken to the operating suite  where a general anesthetic was applied without difficulty.  She was also  given a block in the preanesthesia area.  She was positioned supine and  prepped and draped in a normal sterile fashion.  After the administration of  IV Kefzol, the right leg was elevated, exsanguinated and a tourniquet  inflated about the thigh.  A longitudinal anterior incision was made with  dissection down to the extensor mechanism.  All appropriate and anti-  infective measures were used, including the preoperative IV antibiotic,  Betadine-impregnated drape and closed hooded exhaust systems for each member  of the surgical team.  A medial parapatellar incision was made.  The kneecap  was flipped and the knee flexed.  Some residual meniscal tissues were  removed.  She had advanced degenerative change in all three compartments,  but good bone quality.  We  placed an extramedullary guide on the tibia and  made a cut roughly flat, with no tilt.  We then placed he intramedullary  guide on the femur to make the anterior and posterior cuts, creating a  flexion gap of about 12.5 mm.  A second intramedullary guide was placed on  the femur, making a distal cut, creating an equal extension gap of 12.5 mm.  The femur sized to a standard and the tibia to a 2.5, and the appropriate  guides were placed and utilized.  We did place the MBT guide and drilled for  a stem on the tibia.  The patella was cut down in thickness from 19 to about  13, and sized to a 35 with an appropriate guide placed and utilized.  A  trial reduction was done with these components.  The knee easily came to  full extension and flexed well.  The patella tracked well.  The trial  components were removed, followed by pulsatile lavage  irrigation of all  three cut bony surfaces.  Cement was mixed including Zinacef, and this was  pressurized  on all three bones.  The aforementioned DePuy components were  then placed.  Excess cement was trimmed and pressure was held on the  components until the cement hardened.  The tourniquet was deflated and a  small amount of bleeding was easily controlled with Bovie cautery.  We did  place some thrombin spray inside the wound to help control bleeding.  A  drain was placed, exiting superolaterally, followed by reapproximation of  the extensor mechanism with #1 Vicryl in an interrupted fashion.  The  subcutaneous tissues were reapproximated with #0 and #2-0 undyed Vicryl, and  the skin was closed with nylon.  Adaptic was applied to the wound, followed  by dry gauze and a loose Ace wrap.  The knee flexed to 120 at the end of the  case against gravity.  The estimated blood loss was 100 mL and  intraoperative fluids can be obtained from the anesthesia records.  The  tourniquet time was just over one  hour and can also be obtained from the  anesthesia records.   DISPOSITION:  The patient was extubated in the operating room and taken to  the recovery room in stable condition.  She was to be admitted to the  orthopedic surgery service for appropriate postop care, to include  perioperative antibiotics and Coumadin plus Lovenox for DVT prophylaxis.      Lubertha Basque Jerl Santos, M.D.  Electronically Signed     PGD/MEDQ  D:  01/18/2006  T:  01/18/2006  Job:  [id number]

## 2010-12-21 LAB — CBC
HCT: 36
HCT: 32.1 — ABNORMAL LOW
HCT: 33.7 — ABNORMAL LOW
HCT: 36
HCT: 40.9
Hemoglobin: 12.3
Hemoglobin: 11.1 — ABNORMAL LOW
Hemoglobin: 11.4 — ABNORMAL LOW
Hemoglobin: 12.4
Hemoglobin: 13.7
MCHC: 34.1
MCHC: 33.6
MCHC: 33.8
MCHC: 34.4
MCHC: 34.5
MCV: 86.3
MCV: 85.2
MCV: 86.4
MCV: 87.2
MCV: 87.6
Platelets: 202
Platelets: 197
Platelets: 202
Platelets: 209
Platelets: 230
RBC: 4.17
RBC: 3.77 — ABNORMAL LOW
RBC: 3.9
RBC: 4.11
RBC: 4.69
RDW: 13.4
RDW: 13.5
RDW: 13.5
RDW: 13.7
RDW: 13.9
WBC: 4.2
WBC: 6.7
WBC: 8.1
WBC: 9.5
WBC: 9.9

## 2010-12-21 LAB — BASIC METABOLIC PANEL
BUN: 11
BUN: 15
BUN: 4 — ABNORMAL LOW
BUN: 5 — ABNORMAL LOW
BUN: 9
CO2: 29
CO2: 25
CO2: 27
CO2: 27
CO2: 28
Calcium: 9.6
Calcium: 8.6
Calcium: 8.7
Calcium: 8.9
Calcium: 9.6
Chloride: 99
Chloride: 102
Chloride: 97
Chloride: 97
Chloride: 98
Creatinine, Ser: 0.54
Creatinine, Ser: 0.38 — ABNORMAL LOW
Creatinine, Ser: 0.4
Creatinine, Ser: 0.53
Creatinine, Ser: 0.53
GFR calc Af Amer: >60
GFR calc Af Amer: >60
GFR calc Af Amer: >60
GFR calc Af Amer: >60
GFR calc Af Amer: >60
GFR calc non Af Amer: >60
GFR calc non Af Amer: >60
GFR calc non Af Amer: >60
GFR calc non Af Amer: >60
GFR calc non Af Amer: >60
Glucose, Bld: 226 — ABNORMAL HIGH
Glucose, Bld: 194 — ABNORMAL HIGH
Glucose, Bld: 198 — ABNORMAL HIGH
Glucose, Bld: 207 — ABNORMAL HIGH
Glucose, Bld: 230 — ABNORMAL HIGH
Potassium: 4.2
Potassium: 3.1 — ABNORMAL LOW
Potassium: 3.8
Potassium: 3.8
Potassium: 4
Sodium: 136
Sodium: 131 — ABNORMAL LOW
Sodium: 133 — ABNORMAL LOW
Sodium: 135
Sodium: 136

## 2010-12-21 LAB — GRAM STAIN

## 2010-12-21 LAB — ANAEROBIC CULTURE

## 2010-12-21 LAB — HEMOGLOBIN A1C
Hgb A1c MFr Bld: 8.1 — ABNORMAL HIGH
Mean Plasma Glucose: 186

## 2010-12-21 LAB — GLUCOSE, CAPILLARY
Glucose-Capillary: 149 — ABNORMAL HIGH
Glucose-Capillary: 168 — ABNORMAL HIGH
Glucose-Capillary: 175 — ABNORMAL HIGH
Glucose-Capillary: 175 — ABNORMAL HIGH
Glucose-Capillary: 175 — ABNORMAL HIGH
Glucose-Capillary: 185 — ABNORMAL HIGH
Glucose-Capillary: 186 — ABNORMAL HIGH
Glucose-Capillary: 193 — ABNORMAL HIGH
Glucose-Capillary: 199 — ABNORMAL HIGH
Glucose-Capillary: 204 — ABNORMAL HIGH
Glucose-Capillary: 207 — ABNORMAL HIGH
Glucose-Capillary: 224 — ABNORMAL HIGH
Glucose-Capillary: 246 — ABNORMAL HIGH
Glucose-Capillary: 300 — ABNORMAL HIGH

## 2010-12-21 LAB — CULTURE, ROUTINE-ABSCESS

## 2011-01-20 DIAGNOSIS — K469 Unspecified abdominal hernia without obstruction or gangrene: Secondary | ICD-10-CM | POA: Insufficient documentation

## 2011-04-19 ENCOUNTER — Other Ambulatory Visit: Payer: Self-pay | Admitting: Orthopaedic Surgery

## 2011-04-19 DIAGNOSIS — L02619 Cutaneous abscess of unspecified foot: Secondary | ICD-10-CM

## 2011-04-20 ENCOUNTER — Inpatient Hospital Stay (HOSPITAL_COMMUNITY)
Admission: RE | Admit: 2011-04-20 | Discharge: 2011-04-20 | Disposition: A | Discharge status: Home / Self Care | Payer: BC Managed Care – PPO | Source: Ambulatory Visit

## 2011-04-20 ENCOUNTER — Ambulatory Visit (HOSPITAL_COMMUNITY)
Admission: RE | Admit: 2011-04-20 | Discharge: 2011-04-20 | Disposition: A | Discharge status: Home / Self Care | Payer: BC Managed Care – PPO | Source: Ambulatory Visit | Attending: Orthopaedic Surgery | Admitting: Orthopaedic Surgery

## 2011-04-20 ENCOUNTER — Other Ambulatory Visit: Payer: Self-pay | Admitting: Orthopaedic Surgery

## 2011-04-20 DIAGNOSIS — L02619 Cutaneous abscess of unspecified foot: Secondary | ICD-10-CM | POA: Insufficient documentation

## 2011-04-20 DIAGNOSIS — L03119 Cellulitis of unspecified part of limb: Secondary | ICD-10-CM

## 2011-04-20 LAB — BUN: BUN: 47 mg/dL — ABNORMAL HIGH (ref 6–23)

## 2011-04-20 LAB — CREATININE, SERUM
Creatinine, Ser: 1.26 mg/dL — ABNORMAL HIGH (ref 0.50–1.10)
GFR calc Af Amer: 53 mL/min — ABNORMAL LOW (ref >90)
GFR calc non Af Amer: 46 mL/min — ABNORMAL LOW (ref >90)

## 2011-04-20 MED ORDER — VANCOMYCIN HCL IN DEXTROSE 1-5 GM/200ML-% IV SOLN
1000.0000 mg | Freq: Once | INTRAVENOUS | Status: AC
  Administered 2011-04-20: 1000 mg via INTRAVENOUS
  Filled 2011-04-20: qty 200

## 2011-04-20 MED ORDER — SODIUM CHLORIDE 0.9 % IV SOLN
Freq: Once | INTRAVENOUS | Status: AC
  Administered 2011-04-20: 12:00:00 via INTRAVENOUS

## 2011-04-20 MED ORDER — HEPARIN SOD (PORK) LOCK FLUSH 100 UNIT/ML IV SOLN
250.0000 [IU] | Freq: Once | INTRAVENOUS | Status: AC
  Administered 2011-04-20: 250 [IU] via INTRAVENOUS

## 2011-04-20 MED ORDER — HEPARIN SOD (PORK) LOCK FLUSH 100 UNIT/ML IV SOLN
INTRAVENOUS | Status: AC
  Administered 2011-04-20: 250 [IU] via INTRAVENOUS
  Filled 2011-04-20: qty 5

## 2011-04-20 NOTE — Procedures (Signed)
Rt arm SL PICC 39cm Tip in SVC/RA Ready to use  Pt tolerated well Dx: cellulitis PICC for antibiotics

## 2011-04-26 ENCOUNTER — Other Ambulatory Visit: Payer: Self-pay | Admitting: Orthopaedic Surgery

## 2011-04-26 DIAGNOSIS — M79672 Pain in left foot: Secondary | ICD-10-CM

## 2011-04-27 ENCOUNTER — Ambulatory Visit
Admission: RE | Admit: 2011-04-27 | Discharge: 2011-04-27 | Disposition: A | Discharge status: Home / Self Care | Payer: BC Managed Care – PPO | Source: Ambulatory Visit | Attending: Orthopaedic Surgery | Admitting: Orthopaedic Surgery

## 2011-04-27 DIAGNOSIS — M79672 Pain in left foot: Secondary | ICD-10-CM

## 2011-04-27 MED ORDER — GADOBENATE DIMEGLUMINE 529 MG/ML IV SOLN: 10.0000 mL | Freq: Once | INTRAVENOUS | Status: AC | PRN

## 2011-05-01 ENCOUNTER — Other Ambulatory Visit: Payer: BC Managed Care – PPO

## 2011-09-28 ENCOUNTER — Other Ambulatory Visit: Payer: BC Managed Care – PPO

## 2011-09-28 ENCOUNTER — Encounter: Payer: Self-pay | Admitting: Gastroenterology

## 2011-09-28 ENCOUNTER — Ambulatory Visit (INDEPENDENT_AMBULATORY_CARE_PROVIDER_SITE_OTHER): Payer: BC Managed Care – PPO | Admitting: Gastroenterology

## 2011-09-28 VITALS — BP 124/60 | HR 72 | Ht 61.0 in | Wt 237.8 lb

## 2011-09-28 DIAGNOSIS — D649 Anemia, unspecified: Secondary | ICD-10-CM

## 2011-09-28 DIAGNOSIS — K625 Hemorrhage of anus and rectum: Secondary | ICD-10-CM

## 2011-09-28 NOTE — Patient Instructions (Addendum)
You will have labs checked today in the basement lab.  Please head down after you check out with the front desk  (celiac sprue panel). Will likely need EGD +/- colonoscopy depending on results of the blood tests (would be with MAC sedation such as previously). A copy of this information will be made available to Dr. Waynard Edwards.

## 2011-09-28 NOTE — Progress Notes (Signed)
Review of pertinent gastrointestinal problems: 1. routine risk for colon cancer: Colonoscopy July 2010 was normal. Recall colonoscopy at 10 year interval was recommended. This colonoscopy was done for Hemoccult-positive stool, done with MAC sedation    HPI: This is a   very pleasant 61 year old woman whom I last saw about 3 years ago.  Recent blood work shows hemoglobin 10.0, MCV 78, TIBC was elevated, serum iron was low normal, transferrin was high, her ferritin was also low normal. She was Hemoccult-positive 3 out of 3 tests.  She never sees any blood in stool.  No nose bleeds.  She did have some microsopc blood in urine, known k stone.  Her bowels have "always been weird."  Stays constipated.  Can be incontinent at other extreme.  Does have anal fissures with her constipation at times.  Has had some severe abd pains, recently, associated with diahrea.  Diclofenac and 2 baby asa daily.  Takes prilosec daily.  Overall stable weight.  No gerd symptoms, no dsyphagia.   Past Medical History  Diagnosis Date  . Urticaria   . Fibromyalgia   . Tachycardia   . OSA (obstructive sleep apnea)   . Migraine   . DM (diabetes mellitus)   . Hyperlipidemia   . Bell's palsy   . Hypertension   . Hypothyroidism   . Obesity   . UTI (urinary tract infection)   . Nephrolithiasis   . Anemia   . Carbuncle and furuncle of trunk     Past Surgical History  Procedure Date  . Appendectomy   . Abdominal hysterectomy   . Cholecystectomy   . Cesarean section   . Carpal tunnel release   . Knee arthroscopy   . Shoulder arthroscopy     Current Outpatient Prescriptions  Medication Sig Dispense Refill  . aspirin 81 MG tablet Take 81 mg by mouth daily.      . insulin aspart (NOVOLOG) 100 UNIT/ML injection Inject 15 Units into the skin 3 (three) times daily before meals.      . insulin detemir (LEVEMIR) 100 UNIT/ML injection Inject 40 Units into the skin 2 (two) times daily.      Marland Kitchen losartan (COZAAR)  50 MG tablet Take 50 mg by mouth daily.      . metFORMIN (GLUCOPHAGE) 1000 MG tablet Take 1,000 mg by mouth 2 (two) times daily with a meal.        Allergies as of 09/28/2011 - Review Complete 09/28/2011  Allergen Reaction Noted  . Codeine  01/25/2008  . Morphine  01/25/2008  . Promethazine hcl  01/25/2008  . Sulfonamide derivatives  01/25/2008    Family History  Problem Relation Age of Onset  . Diabetes Father   . Osteoarthritis Father   . Heart disease Father   . Ulcers Father   . Stroke Mother     History   Social History  . Marital Status: Married    Spouse Name: N/A    Number of Children: 3  . Years of Education: N/A   Occupational History  . disabled    Social History Main Topics  . Smoking status: Never Smoker   . Smokeless tobacco: Never Used  . Alcohol Use: No  . Drug Use: No  . Sexually Active: Not on file   Other Topics Concern  . Not on file   Social History Narrative  . No narrative on file      Physical Exam: BP 124/60  Pulse 72  Ht 5\' 1"  (1.549 m)  Wt 237 lb 12.8 oz (107.865 kg)  BMI 44.93 kg/m2 Constitutional: generally well-appearing Psychiatric: alert and oriented x3 Abdomen: soft, nontender, nondistended, no obvious ascites, no peritoneal signs, normal bowel sounds     Assessment and plan: 61 y.o. female with iron deficiency, microcytic anemia. Colonoscopy 3 years ago, she does take NSAIDs daily.  Celiac sprue can cause iron deficiency anemia however should not be responsible for microscopic blood in the stool. We will test her for celiac sprue with a blood panel. I think likely she is going to need an upper endoscopy and if that is unrevealing then she will need repeat colonoscopy given her iron deficiency anemia and persistent Hemoccult-positive stools. She explained to me that this could cause quite a bit it out a pocket expense unless it is done as a "preventative exam."  I do not think that that will be the case since she has  symptoms which we are trying to explain. We are going to put her in touch with our insurance expert here at [place] to help with her questions.

## 2011-09-29 LAB — CELIAC PANEL 10
Endomysial Screen: NEGATIVE
Gliadin IgA: 3.9 U/mL (ref <20)
Gliadin IgG: 4.5 U/mL (ref <20)
IgA: 120 mg/dL (ref 69–380)
Tissue Transglut Ab: 3.4 U/mL (ref <20)
Tissue Transglutaminase Ab, IgA: 3.8 U/mL (ref <20)

## 2012-03-22 HISTORY — PX: LAPAROSCOPIC GASTRIC SLEEVE RESECTION: SHX5895

## 2012-07-20 DIAGNOSIS — T148XXA Other injury of unspecified body region, initial encounter: Secondary | ICD-10-CM

## 2012-07-20 HISTORY — DX: Other injury of unspecified body region, initial encounter: T14.8XXA

## 2012-08-15 ENCOUNTER — Inpatient Hospital Stay (HOSPITAL_COMMUNITY)
Admission: EM | Admit: 2012-08-15 | Discharge: 2012-08-18 | DRG: 552 | Disposition: A | Discharge status: Home Health | Payer: No Typology Code available for payment source | Attending: General Surgery | Admitting: General Surgery

## 2012-08-15 ENCOUNTER — Encounter (HOSPITAL_COMMUNITY): Payer: Self-pay

## 2012-08-15 ENCOUNTER — Emergency Department (HOSPITAL_COMMUNITY): Payer: No Typology Code available for payment source

## 2012-08-15 DIAGNOSIS — H5704 Mydriasis: Secondary | ICD-10-CM | POA: Diagnosis present

## 2012-08-15 DIAGNOSIS — S060X0A Concussion without loss of consciousness, initial encounter: Secondary | ICD-10-CM | POA: Diagnosis present

## 2012-08-15 DIAGNOSIS — S069X1A Unspecified intracranial injury with loss of consciousness of 30 minutes or less, initial encounter: Secondary | ICD-10-CM

## 2012-08-15 DIAGNOSIS — R51 Headache: Secondary | ICD-10-CM | POA: Diagnosis not present

## 2012-08-15 DIAGNOSIS — G510 Bell's palsy: Secondary | ICD-10-CM | POA: Diagnosis present

## 2012-08-15 DIAGNOSIS — IMO0001 Reserved for inherently not codable concepts without codable children: Secondary | ICD-10-CM | POA: Diagnosis present

## 2012-08-15 DIAGNOSIS — R000 Tachycardia, unspecified: Secondary | ICD-10-CM | POA: Diagnosis present

## 2012-08-15 DIAGNOSIS — Y92410 Unspecified street and highway as the place of occurrence of the external cause: Secondary | ICD-10-CM

## 2012-08-15 DIAGNOSIS — M25519 Pain in unspecified shoulder: Secondary | ICD-10-CM

## 2012-08-15 DIAGNOSIS — I10 Essential (primary) hypertension: Secondary | ICD-10-CM | POA: Diagnosis present

## 2012-08-15 DIAGNOSIS — S12000A Unspecified displaced fracture of first cervical vertebra, initial encounter for closed fracture: Secondary | ICD-10-CM

## 2012-08-15 DIAGNOSIS — M25512 Pain in left shoulder: Secondary | ICD-10-CM | POA: Diagnosis present

## 2012-08-15 DIAGNOSIS — S129XXA Fracture of neck, unspecified, initial encounter: Secondary | ICD-10-CM

## 2012-08-15 DIAGNOSIS — M7530 Calcific tendinitis of unspecified shoulder: Secondary | ICD-10-CM | POA: Diagnosis present

## 2012-08-15 DIAGNOSIS — N390 Urinary tract infection, site not specified: Secondary | ICD-10-CM | POA: Diagnosis present

## 2012-08-15 DIAGNOSIS — R339 Retention of urine, unspecified: Secondary | ICD-10-CM | POA: Diagnosis not present

## 2012-08-15 DIAGNOSIS — E039 Hypothyroidism, unspecified: Secondary | ICD-10-CM | POA: Diagnosis present

## 2012-08-15 DIAGNOSIS — G43909 Migraine, unspecified, not intractable, without status migrainosus: Secondary | ICD-10-CM | POA: Diagnosis present

## 2012-08-15 DIAGNOSIS — S060X9A Concussion with loss of consciousness of unspecified duration, initial encounter: Secondary | ICD-10-CM

## 2012-08-15 DIAGNOSIS — E785 Hyperlipidemia, unspecified: Secondary | ICD-10-CM | POA: Diagnosis present

## 2012-08-15 DIAGNOSIS — G4733 Obstructive sleep apnea (adult) (pediatric): Secondary | ICD-10-CM | POA: Diagnosis present

## 2012-08-15 DIAGNOSIS — E669 Obesity, unspecified: Secondary | ICD-10-CM | POA: Diagnosis present

## 2012-08-15 DIAGNOSIS — S060XAA Concussion with loss of consciousness status unknown, initial encounter: Secondary | ICD-10-CM

## 2012-08-15 DIAGNOSIS — D649 Anemia, unspecified: Secondary | ICD-10-CM | POA: Diagnosis present

## 2012-08-15 DIAGNOSIS — H1130 Conjunctival hemorrhage, unspecified eye: Secondary | ICD-10-CM | POA: Diagnosis present

## 2012-08-15 DIAGNOSIS — E119 Type 2 diabetes mellitus without complications: Secondary | ICD-10-CM

## 2012-08-15 DIAGNOSIS — Z6841 Body Mass Index (BMI) 40.0 and over, adult: Secondary | ICD-10-CM

## 2012-08-15 DIAGNOSIS — Y998 Other external cause status: Secondary | ICD-10-CM

## 2012-08-15 DIAGNOSIS — R110 Nausea: Secondary | ICD-10-CM | POA: Diagnosis not present

## 2012-08-15 LAB — POCT I-STAT, CHEM 8
BUN: 25 mg/dL — ABNORMAL HIGH (ref 6–23)
Calcium, Ion: 1.32 mmol/L — ABNORMAL HIGH (ref 1.13–1.30)
Chloride: 108 mEq/L (ref 96–112)
Creatinine, Ser: 0.8 mg/dL (ref 0.50–1.10)
Glucose, Bld: 221 mg/dL — ABNORMAL HIGH (ref 70–99)
HCT: 32 % — ABNORMAL LOW (ref 36.0–46.0)
Hemoglobin: 10.9 g/dL — ABNORMAL LOW (ref 12.0–15.0)
Potassium: 5.1 mEq/L (ref 3.5–5.1)
Sodium: 141 mEq/L (ref 135–145)
TCO2: 24 mmol/L (ref 0–100)

## 2012-08-15 LAB — URINALYSIS, ROUTINE W REFLEX MICROSCOPIC
Bilirubin Urine: NEGATIVE
Glucose, UA: 100 mg/dL — AB
Ketones, ur: NEGATIVE mg/dL
Nitrite: NEGATIVE
Protein, ur: NEGATIVE mg/dL
Specific Gravity, Urine: 1.029 (ref 1.005–1.030)
Urobilinogen, UA: 1 mg/dL (ref 0.0–1.0)
pH: 6.5 (ref 5.0–8.0)

## 2012-08-15 LAB — COMPREHENSIVE METABOLIC PANEL
ALT: 53 U/L — ABNORMAL HIGH (ref 0–35)
AST: 51 U/L — ABNORMAL HIGH (ref 0–37)
Albumin: 3.3 g/dL — ABNORMAL LOW (ref 3.5–5.2)
Alkaline Phosphatase: 66 U/L (ref 39–117)
BUN: 24 mg/dL — ABNORMAL HIGH (ref 6–23)
CO2: 21 mEq/L (ref 19–32)
Calcium: 9.7 mg/dL (ref 8.4–10.5)
Chloride: 104 mEq/L (ref 96–112)
Creatinine, Ser: 0.89 mg/dL (ref 0.50–1.10)
GFR calc Af Amer: 80 mL/min — ABNORMAL LOW (ref >90)
GFR calc non Af Amer: 69 mL/min — ABNORMAL LOW (ref >90)
Glucose, Bld: 226 mg/dL — ABNORMAL HIGH (ref 70–99)
Potassium: 5.2 mEq/L — ABNORMAL HIGH (ref 3.5–5.1)
Sodium: 139 mEq/L (ref 135–145)
Total Bilirubin: 0.2 mg/dL — ABNORMAL LOW (ref 0.3–1.2)
Total Protein: 6.8 g/dL (ref 6.0–8.3)

## 2012-08-15 LAB — GLUCOSE, CAPILLARY
Glucose-Capillary: 140 mg/dL — ABNORMAL HIGH (ref 70–99)
Glucose-Capillary: 140 mg/dL — ABNORMAL HIGH (ref 70–99)

## 2012-08-15 LAB — CBC
HCT: 32.5 % — ABNORMAL LOW (ref 36.0–46.0)
Hemoglobin: 10.9 g/dL — ABNORMAL LOW (ref 12.0–15.0)
MCH: 28.9 pg (ref 26.0–34.0)
MCHC: 33.5 g/dL (ref 30.0–36.0)
MCV: 86.2 fL (ref 78.0–100.0)
Platelets: 163 10*3/uL (ref 150–400)
RBC: 3.77 MIL/uL — ABNORMAL LOW (ref 3.87–5.11)
RDW: 13.7 % (ref 11.5–15.5)
WBC: 4.1 10*3/uL (ref 4.0–10.5)

## 2012-08-15 LAB — TYPE AND SCREEN
ABO/RH(D): A NEG
Antibody Screen: NEGATIVE

## 2012-08-15 LAB — URINE MICROSCOPIC-ADD ON

## 2012-08-15 LAB — ABO/RH: ABO/RH(D): A NEG

## 2012-08-15 LAB — PROTIME-INR
INR: 1.02 (ref 0.00–1.49)
Prothrombin Time: 13.3 seconds (ref 11.6–15.2)

## 2012-08-15 LAB — MRSA PCR SCREENING: MRSA by PCR: NEGATIVE

## 2012-08-15 LAB — CG4 I-STAT (LACTIC ACID): Lactic Acid, Venous: 1.91 mmol/L (ref 0.5–2.2)

## 2012-08-15 MED ORDER — METOPROLOL SUCCINATE ER 100 MG PO TB24
200.0000 mg | ORAL_TABLET | Freq: Every day | ORAL | Status: DC
  Administered 2012-08-15 – 2012-08-18 (×3): 200 mg via ORAL
  Filled 2012-08-15 (×5): qty 2

## 2012-08-15 MED ORDER — NALOXONE HCL 0.4 MG/ML IJ SOLN: 0.4000 mg | INTRAMUSCULAR | Status: DC | PRN

## 2012-08-15 MED ORDER — HYDROMORPHONE HCL PF 1 MG/ML IJ SOLN
1.0000 mg | Freq: Once | INTRAMUSCULAR | Status: AC
  Administered 2012-08-15: 1 mg via INTRAVENOUS
  Filled 2012-08-15: qty 1

## 2012-08-15 MED ORDER — BIOTENE DRY MOUTH MT LIQD
15.0000 mL | Freq: Two times a day (BID) | OROMUCOSAL | Status: DC
  Administered 2012-08-16 – 2012-08-18 (×4): 15 mL via OROMUCOSAL

## 2012-08-15 MED ORDER — CHLORHEXIDINE GLUCONATE 0.12 % MT SOLN
15.0000 mL | Freq: Two times a day (BID) | OROMUCOSAL | Status: DC
  Administered 2012-08-16 – 2012-08-18 (×5): 15 mL via OROMUCOSAL
  Filled 2012-08-15 (×7): qty 15

## 2012-08-15 MED ORDER — PANTOPRAZOLE SODIUM 40 MG PO TBEC
40.0000 mg | DELAYED_RELEASE_TABLET | Freq: Every day | ORAL | Status: DC
  Administered 2012-08-15 – 2012-08-16 (×2): 40 mg via ORAL
  Filled 2012-08-15 (×2): qty 1

## 2012-08-15 MED ORDER — ONDANSETRON HCL 4 MG/2ML IJ SOLN
INTRAMUSCULAR | Status: AC
  Filled 2012-08-15: qty 2

## 2012-08-15 MED ORDER — KCL IN DEXTROSE-NACL 20-5-0.45 MEQ/L-%-% IV SOLN
INTRAVENOUS | Status: DC
  Administered 2012-08-15: 20:00:00 via INTRAVENOUS
  Filled 2012-08-15: qty 1000

## 2012-08-15 MED ORDER — INSULIN ASPART 100 UNIT/ML ~~LOC~~ SOLN
0.0000 [IU] | Freq: Three times a day (TID) | SUBCUTANEOUS | Status: DC
  Administered 2012-08-16: 4 [IU] via SUBCUTANEOUS
  Administered 2012-08-17: 7 [IU] via SUBCUTANEOUS
  Administered 2012-08-17 – 2012-08-18 (×2): 3 [IU] via SUBCUTANEOUS
  Administered 2012-08-18: 4 [IU] via SUBCUTANEOUS

## 2012-08-15 MED ORDER — INSULIN DETEMIR 100 UNIT/ML ~~LOC~~ SOLN
25.0000 [IU] | Freq: Two times a day (BID) | SUBCUTANEOUS | Status: DC
  Administered 2012-08-15 – 2012-08-16 (×3): 25 [IU] via SUBCUTANEOUS
  Filled 2012-08-15 (×7): qty 0.25

## 2012-08-15 MED ORDER — FLUOXETINE HCL (PMDD) 20 MG PO CAPS
1.0000 | ORAL_CAPSULE | Freq: Every day | ORAL | Status: DC
Start: 2012-08-15 — End: 2012-08-15

## 2012-08-15 MED ORDER — ONDANSETRON HCL 4 MG/2ML IJ SOLN
4.0000 mg | Freq: Four times a day (QID) | INTRAMUSCULAR | Status: DC | PRN
  Administered 2012-08-16 – 2012-08-18 (×6): 4 mg via INTRAVENOUS
  Filled 2012-08-15 (×7): qty 2

## 2012-08-15 MED ORDER — NABUMETONE 500 MG PO TABS
500.0000 mg | ORAL_TABLET | Freq: Two times a day (BID) | ORAL | Status: DC
  Administered 2012-08-15 – 2012-08-18 (×6): 500 mg via ORAL
  Filled 2012-08-15 (×7): qty 1

## 2012-08-15 MED ORDER — HYDROMORPHONE HCL PF 1 MG/ML IJ SOLN
0.5000 mg | INTRAMUSCULAR | Status: DC | PRN
  Administered 2012-08-15: 0.5 mg via INTRAVENOUS
  Filled 2012-08-15: qty 1

## 2012-08-15 MED ORDER — LEVOTHYROXINE SODIUM 75 MCG PO TABS
75.0000 ug | ORAL_TABLET | Freq: Every day | ORAL | Status: DC
  Administered 2012-08-16 – 2012-08-18 (×3): 75 ug via ORAL
  Filled 2012-08-15 (×4): qty 1

## 2012-08-15 MED ORDER — OXYCODONE HCL 5 MG PO TABS
5.0000 mg | ORAL_TABLET | ORAL | Status: DC | PRN
  Administered 2012-08-15 – 2012-08-16 (×3): 5 mg via ORAL
  Filled 2012-08-15 (×3): qty 1

## 2012-08-15 MED ORDER — ONDANSETRON HCL 4 MG/2ML IJ SOLN: 4.0000 mg | Freq: Four times a day (QID) | INTRAMUSCULAR | Status: DC | PRN

## 2012-08-15 MED ORDER — IOHEXOL 300 MG/ML  SOLN
100.0000 mL | Freq: Once | INTRAMUSCULAR | Status: AC | PRN
  Administered 2012-08-15: 100 mL via INTRAVENOUS

## 2012-08-15 MED ORDER — DIPHENHYDRAMINE HCL 12.5 MG/5ML PO ELIX
12.5000 mg | ORAL_SOLUTION | Freq: Four times a day (QID) | ORAL | Status: DC | PRN
  Filled 2012-08-15: qty 5

## 2012-08-15 MED ORDER — ROPINIROLE HCL 1 MG PO TABS
2.0000 mg | ORAL_TABLET | Freq: Every day | ORAL | Status: DC
  Administered 2012-08-15 – 2012-08-17 (×3): 2 mg via ORAL
  Filled 2012-08-15 (×4): qty 2

## 2012-08-15 MED ORDER — ROPINIROLE HCL 1 MG PO TABS: 2.0000 mg | ORAL_TABLET | Freq: Every day | ORAL | Status: DC

## 2012-08-15 MED ORDER — FENOFIBRATE 160 MG PO TABS
160.0000 mg | ORAL_TABLET | Freq: Every day | ORAL | Status: DC
  Administered 2012-08-15 – 2012-08-18 (×4): 160 mg via ORAL
  Filled 2012-08-15 (×4): qty 1

## 2012-08-15 MED ORDER — ONDANSETRON HCL 4 MG/2ML IJ SOLN
4.0000 mg | Freq: Once | INTRAMUSCULAR | Status: AC
  Administered 2012-08-15: 4 mg via INTRAVENOUS

## 2012-08-15 MED ORDER — ONDANSETRON HCL 4 MG PO TABS
4.0000 mg | ORAL_TABLET | Freq: Four times a day (QID) | ORAL | Status: DC | PRN
  Administered 2012-08-15 – 2012-08-18 (×3): 4 mg via ORAL
  Filled 2012-08-15 (×3): qty 1

## 2012-08-15 MED ORDER — EZETIMIBE-SIMVASTATIN 10-20 MG PO TABS
1.0000 | ORAL_TABLET | Freq: Every day | ORAL | Status: DC
  Administered 2012-08-15: 1 via ORAL
  Filled 2012-08-15 (×4): qty 1

## 2012-08-15 MED ORDER — ENOXAPARIN SODIUM 40 MG/0.4ML ~~LOC~~ SOLN
40.0000 mg | SUBCUTANEOUS | Status: DC
  Administered 2012-08-15: 40 mg via SUBCUTANEOUS
  Filled 2012-08-15 (×2): qty 0.4

## 2012-08-15 MED ORDER — HYDROMORPHONE 0.3 MG/ML IV SOLN
INTRAVENOUS | Status: DC
  Administered 2012-08-15: 20:00:00 via INTRAVENOUS
  Filled 2012-08-15: qty 25

## 2012-08-15 MED ORDER — DIPHENHYDRAMINE HCL 50 MG/ML IJ SOLN: 12.5000 mg | Freq: Four times a day (QID) | INTRAMUSCULAR | Status: DC | PRN

## 2012-08-15 MED ORDER — LOSARTAN POTASSIUM 50 MG PO TABS
50.0000 mg | ORAL_TABLET | Freq: Every day | ORAL | Status: DC
  Administered 2012-08-16 – 2012-08-18 (×3): 50 mg via ORAL
  Filled 2012-08-15 (×3): qty 1

## 2012-08-15 MED ORDER — PANTOPRAZOLE SODIUM 40 MG IV SOLR
40.0000 mg | Freq: Every day | INTRAVENOUS | Status: DC
  Filled 2012-08-15: qty 40

## 2012-08-15 MED ORDER — SODIUM CHLORIDE 0.9 % IJ SOLN: 9.0000 mL | INTRAMUSCULAR | Status: DC | PRN

## 2012-08-15 MED ORDER — BACITRACIN ZINC 500 UNIT/GM EX OINT
TOPICAL_OINTMENT | Freq: Two times a day (BID) | CUTANEOUS | Status: DC
  Administered 2012-08-15 – 2012-08-18 (×6): via TOPICAL
  Filled 2012-08-15: qty 15

## 2012-08-15 MED ORDER — HYDROMORPHONE HCL PF 1 MG/ML IJ SOLN
1.0000 mg | INTRAMUSCULAR | Status: DC | PRN
  Administered 2012-08-16 (×4): 1 mg via INTRAVENOUS
  Filled 2012-08-15 (×4): qty 1

## 2012-08-15 MED ORDER — FLUOXETINE HCL 20 MG PO CAPS
20.0000 mg | ORAL_CAPSULE | Freq: Every day | ORAL | Status: DC
  Administered 2012-08-15 – 2012-08-18 (×4): 20 mg via ORAL
  Filled 2012-08-15 (×4): qty 1

## 2012-08-15 MED ORDER — METOPROLOL SUCCINATE ER 100 MG PO TB24: 200.0000 mg | ORAL_TABLET | Freq: Every day | ORAL | Status: DC

## 2012-08-15 MED ORDER — OXYCODONE HCL 5 MG PO TABS: 2.5000 mg | ORAL_TABLET | ORAL | Status: DC | PRN

## 2012-08-15 MED ORDER — INSULIN ASPART 100 UNIT/ML ~~LOC~~ SOLN: 0.0000 [IU] | Freq: Every day | SUBCUTANEOUS | Status: DC

## 2012-08-15 MED ORDER — INSULIN DETEMIR 100 UNIT/ML ~~LOC~~ SOLN: 25.0000 [IU] | Freq: Two times a day (BID) | SUBCUTANEOUS | Status: DC

## 2012-08-15 MED ORDER — ROPINIROLE HCL 0.5 MG PO TABS
0.5000 mg | ORAL_TABLET | Freq: Three times a day (TID) | ORAL | Status: DC | PRN
  Administered 2012-08-18: 0.5 mg via ORAL
  Filled 2012-08-15: qty 1
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
## 2012-08-15 NOTE — Progress Notes (Signed)
Assisted pt. With placing on her home cpap. Pt. Is tolerating well at this time. Rt inspected pt cpap unit. No frayed wires or cords. Machine appears to be intact.pt. Has 02 titrated into the cpap.
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
## 2012-08-15 NOTE — ED Notes (Signed)
CBG of 140 completed

## 2012-08-15 NOTE — ED Notes (Signed)
Dr. Stern at the bedside.  

## 2012-08-15 NOTE — ED Notes (Signed)
Dr. Delo at the bedside 
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
## 2012-08-15 NOTE — H&P (Signed)
Patient examined together with PA and I agree with the assessment and plan. I also spoke to her husband at the bedside and discussed the plan of care.  He will bring in her nasal CPAP unit.  Violeta Gelinas, MD, MPH, FACS Pager: [phone]  08/15/2012 4:21 PM
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
## 2012-08-15 NOTE — ED Notes (Signed)
Dr. Pippin at the bedside.  
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
## 2012-08-15 NOTE — ED Notes (Signed)
Dr. Peyton Najjar back in to speak with family and patient concerning test results.
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
## 2012-08-15 NOTE — ED Notes (Signed)
Discussed encode with dr Judd Lien and per dr Judd Lien he did not want to call a trauma.

## 2012-08-16 ENCOUNTER — Inpatient Hospital Stay (HOSPITAL_COMMUNITY): Payer: No Typology Code available for payment source

## 2012-08-16 DIAGNOSIS — E785 Hyperlipidemia, unspecified: Secondary | ICD-10-CM | POA: Insufficient documentation

## 2012-08-16 DIAGNOSIS — G4733 Obstructive sleep apnea (adult) (pediatric): Secondary | ICD-10-CM | POA: Insufficient documentation

## 2012-08-16 DIAGNOSIS — G43909 Migraine, unspecified, not intractable, without status migrainosus: Secondary | ICD-10-CM | POA: Insufficient documentation

## 2012-08-16 DIAGNOSIS — E669 Obesity, unspecified: Secondary | ICD-10-CM | POA: Insufficient documentation

## 2012-08-16 DIAGNOSIS — D649 Anemia, unspecified: Secondary | ICD-10-CM | POA: Insufficient documentation

## 2012-08-16 DIAGNOSIS — M797 Fibromyalgia: Secondary | ICD-10-CM | POA: Insufficient documentation

## 2012-08-16 DIAGNOSIS — I10 Essential (primary) hypertension: Secondary | ICD-10-CM | POA: Insufficient documentation

## 2012-08-16 DIAGNOSIS — E0590 Thyrotoxicosis, unspecified without thyrotoxic crisis or storm: Secondary | ICD-10-CM | POA: Insufficient documentation

## 2012-08-16 DIAGNOSIS — E119 Type 2 diabetes mellitus without complications: Secondary | ICD-10-CM | POA: Insufficient documentation

## 2012-08-16 DIAGNOSIS — E039 Hypothyroidism, unspecified: Secondary | ICD-10-CM | POA: Insufficient documentation

## 2012-08-16 LAB — BASIC METABOLIC PANEL
BUN: 23 mg/dL (ref 6–23)
CO2: 25 mEq/L (ref 19–32)
Calcium: 9.4 mg/dL (ref 8.4–10.5)
Chloride: 102 mEq/L (ref 96–112)
Creatinine, Ser: 1.04 mg/dL (ref 0.50–1.10)
GFR calc Af Amer: 66 mL/min — ABNORMAL LOW (ref >90)
GFR calc non Af Amer: 57 mL/min — ABNORMAL LOW (ref >90)
Glucose, Bld: 217 mg/dL — ABNORMAL HIGH (ref 70–99)
Potassium: 5.6 mEq/L — ABNORMAL HIGH (ref 3.5–5.1)
Sodium: 137 mEq/L (ref 135–145)

## 2012-08-16 LAB — GLUCOSE, CAPILLARY
Glucose-Capillary: 183 mg/dL — ABNORMAL HIGH (ref 70–99)
Glucose-Capillary: 227 mg/dL — ABNORMAL HIGH (ref 70–99)
Glucose-Capillary: 238 mg/dL — ABNORMAL HIGH (ref 70–99)
Glucose-Capillary: 161 mg/dL — ABNORMAL HIGH (ref 70–99)

## 2012-08-16 LAB — CBC
HCT: 33.9 % — ABNORMAL LOW (ref 36.0–46.0)
Hemoglobin: 10.9 g/dL — ABNORMAL LOW (ref 12.0–15.0)
MCH: 28.9 pg (ref 26.0–34.0)
MCHC: 32.2 g/dL (ref 30.0–36.0)
MCV: 89.9 fL (ref 78.0–100.0)
Platelets: 208 10*3/uL (ref 150–400)
RBC: 3.77 MIL/uL — ABNORMAL LOW (ref 3.87–5.11)
RDW: 14.1 % (ref 11.5–15.5)
WBC: 6 10*3/uL (ref 4.0–10.5)

## 2012-08-16 MED ORDER — ENOXAPARIN SODIUM 30 MG/0.3ML ~~LOC~~ SOLN
30.0000 mg | SUBCUTANEOUS | Status: DC
  Filled 2012-08-16: qty 0.3

## 2012-08-16 MED ORDER — HYDROMORPHONE HCL PF 1 MG/ML IJ SOLN: 0.5000 mg | INTRAMUSCULAR | Status: DC | PRN

## 2012-08-16 MED ORDER — VITAMIN D3 25 MCG (1000 UNIT) PO TABS
1000.0000 [IU] | ORAL_TABLET | Freq: Every day | ORAL | Status: DC
  Administered 2012-08-17 – 2012-08-18 (×2): 1000 [IU] via ORAL
  Filled 2012-08-16 (×3): qty 1

## 2012-08-16 MED ORDER — OXYCODONE HCL 5 MG PO TABS
5.0000 mg | ORAL_TABLET | ORAL | Status: DC | PRN
  Administered 2012-08-17 (×3): 10 mg via ORAL
  Filled 2012-08-16 (×4): qty 2

## 2012-08-16 MED ORDER — TRAMADOL HCL 50 MG PO TABS
100.0000 mg | ORAL_TABLET | Freq: Four times a day (QID) | ORAL | Status: DC
  Administered 2012-08-16 – 2012-08-17 (×3): 100 mg via ORAL
  Filled 2012-08-16 (×9): qty 2

## 2012-08-16 MED ORDER — SODIUM CHLORIDE 0.9 % IV SOLN
INTRAVENOUS | Status: DC
  Administered 2012-08-16: 20:00:00 via INTRAVENOUS

## 2012-08-16 MED ORDER — ENOXAPARIN SODIUM 40 MG/0.4ML ~~LOC~~ SOLN
40.0000 mg | SUBCUTANEOUS | Status: DC
  Administered 2012-08-16 – 2012-08-17 (×2): 40 mg via SUBCUTANEOUS
  Filled 2012-08-16 (×3): qty 0.4

## 2012-08-16 MED ORDER — VITAMIN B-12 1000 MCG PO TABS
1000.0000 ug | ORAL_TABLET | Freq: Every day | ORAL | Status: DC
  Administered 2012-08-17 – 2012-08-18 (×2): 1000 ug via ORAL
  Filled 2012-08-16 (×3): qty 1

## 2012-08-16 MED ORDER — CYCLOBENZAPRINE HCL 10 MG PO TABS: 10.0000 mg | ORAL_TABLET | Freq: Every evening | ORAL | Status: DC | PRN

## 2012-08-16 MED ORDER — NAPROXEN 500 MG PO TABS: 500.0000 mg | ORAL_TABLET | Freq: Two times a day (BID) | ORAL | Status: DC
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
## 2012-08-16 NOTE — Progress Notes (Signed)
UR completed 

## 2012-08-16 NOTE — Progress Notes (Signed)
Rehab Admissions Coordinator Note:  Patient was screened by Trish Mage for appropriateness for an Inpatient Acute Rehab Consult.  At this time, an inpatient rehab consult has been ordered and is pending completion.  Trish Mage 08/16/2012, 2:37 PM  I can be reached at [phone].

## 2012-08-16 NOTE — Progress Notes (Signed)
31 Called Dr. Dixon Boos office awaiting call back. A. Kenshin Splawn, RN
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
## 2012-08-16 NOTE — Progress Notes (Signed)
Inpatient Diabetes Program Recommendations  AACE/ADA: New Consensus Statement on Inpatient Glycemic Control (2013)  Target Ranges:  Prepandial:   less than 140 mg/dL      Peak postprandial:   less than 180 mg/dL (1-2 hours)      Critically ill patients:  140 - 180 mg/dL   Reason for Visit: Hyperglycemia  Results for [name], [name] (MRN [id number]) as of 08/16/2012 09:38  Ref. Range 08/16/2012 05:40  Sodium Latest Range: 135-145 mEq/L 137  Potassium Latest Range: 3.5-5.1 mEq/L 5.6 (H)  Chloride Latest Range: 96-112 mEq/L 102  CO2 Latest Range: 19-32 mEq/L 25  BUN Latest Range: 6-23 mg/dL 23  Creatinine Latest Range: 0.50-1.10 mg/dL 4.09  Calcium Latest Range: 8.4-10.5 mg/dL 9.4  GFR calc non Af Amer Latest Range: >90 mL/min 57 (L)  GFR calc Af Amer Latest Range: >90 mL/min 66 (L)  Glucose Latest Range: 70-99 mg/dL 811 (H)    Inpatient Diabetes Program Recommendations HgbA1C: Need updated HgbA1C to assess glycemic control prior to hospitalization
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
## 2012-08-16 NOTE — Progress Notes (Signed)
Patient complaining of 7-9/10 pain with no other pain medications available to give at the current time. Dr. Corliss Skains made aware and Dilaudid PCA ordered. Will continue to monitor. Jacqulynn Cadet

## 2012-08-16 NOTE — Progress Notes (Signed)
Orthopedic Tech Progress Note Patient Details:  [name] 02/19/1951 [id number]  Patient ID: [name], female   DOB: [birth year], 62 y.o.   MRN: [id number]   [name] 08/16/2012, 9:50 AM  Aspen collar completed by bio-tech .
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
## 2012-08-16 NOTE — Progress Notes (Signed)
Patient ordered to wear nasal CPAP at night.  This causes interference with CO2 monitoring per PCA protocol.  Dr. Corliss Skains made aware of problem and discontinued PCA. New pain medication orders received.  See MAR.  Will continue to monitor. Jacqulynn Cadet
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
## 2012-08-16 NOTE — Progress Notes (Signed)
1615 Assisted patient to the bathroom with two person assist.  Pt unable to void.  Pt assisted back to bed, bladder scanned pt, unable to get an accurate reading secondary to obesity.  Pt in and out cath, 850cc out.  Will continue to monitor. A. Annalyse Langlais, RN

## 2012-08-16 NOTE — Progress Notes (Signed)
Patient is stable.  Will DC foley catheter and transfer to the floor.  This patient has been seen and I agree with the findings and treatment plan.  [name]. Gae Bon, MD, FACS [phone] (pager) [phone] (direct pager) Trauma Surgeon

## 2012-08-17 DIAGNOSIS — H5704 Mydriasis: Secondary | ICD-10-CM | POA: Diagnosis present

## 2012-08-17 DIAGNOSIS — S12000A Unspecified displaced fracture of first cervical vertebra, initial encounter for closed fracture: Secondary | ICD-10-CM

## 2012-08-17 LAB — GLUCOSE, CAPILLARY
Glucose-Capillary: 149 mg/dL — ABNORMAL HIGH (ref 70–99)
Glucose-Capillary: 216 mg/dL — ABNORMAL HIGH (ref 70–99)
Glucose-Capillary: 178 mg/dL — ABNORMAL HIGH (ref 70–99)
Glucose-Capillary: 167 mg/dL — ABNORMAL HIGH (ref 70–99)

## 2012-08-17 LAB — BASIC METABOLIC PANEL
BUN: 21 mg/dL (ref 6–23)
CO2: 25 mEq/L (ref 19–32)
Calcium: 9.4 mg/dL (ref 8.4–10.5)
Chloride: 102 mEq/L (ref 96–112)
Creatinine, Ser: 0.69 mg/dL (ref 0.50–1.10)
GFR calc Af Amer: >90 mL/min (ref >90)
GFR calc non Af Amer: >90 mL/min (ref >90)
Glucose, Bld: 169 mg/dL — ABNORMAL HIGH (ref 70–99)
Potassium: 3.9 mEq/L (ref 3.5–5.1)
Sodium: 139 meq/L (ref 135–145)

## 2012-08-17 MED ORDER — BETHANECHOL CHLORIDE 25 MG PO TABS
25.0000 mg | ORAL_TABLET | Freq: Four times a day (QID) | ORAL | Status: DC
  Administered 2012-08-17 – 2012-08-18 (×5): 25 mg via ORAL
  Filled 2012-08-17 (×9): qty 1

## 2012-08-17 MED ORDER — INSULIN DETEMIR 100 UNIT/ML ~~LOC~~ SOLN
50.0000 [IU] | Freq: Two times a day (BID) | SUBCUTANEOUS | Status: DC
  Administered 2012-08-17 – 2012-08-18 (×3): 50 [IU] via SUBCUTANEOUS
  Filled 2012-08-17 (×4): qty 0.5

## 2012-08-17 NOTE — Progress Notes (Signed)
I agree with the following treatment note after reviewing documentation.   Johnston, Chimaobi Casebolt Brynn   OTR/L Pager: [phone] Office: [phone] .   

## 2012-08-17 NOTE — Progress Notes (Signed)
Rehab admissions - Evaluated for possible admission.  Please see rehab consult done today by Dr. Riley Kill recommending home with [facility] therapies.  Patient did well with PT today and likely can go home with [facility].  Call me for questions.  #[phone]

## 2012-08-17 NOTE — Progress Notes (Signed)
Patient on home CPAP and tolerating well.  Was able to place herself on without any assistance.
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
## 2012-08-17 NOTE — Progress Notes (Signed)
Patient ID: [name], female   DOB: 1950-06-30, 62 y.o.   MRN: [id number]  LATE ENTRY..for 0800 Visited pt; in process of ambulation with CNA.  Pt reports doing well, with no specific pain complaints. Same [place] Collar in use - appears to be secure- a better fit may be difficult d/t pt's body habitus. Planning for CIR.  Georgiann Cocker, RN, BSN
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
## 2012-08-17 NOTE — Progress Notes (Signed)
Checked on Pt for CPAP.  Pt already on unit for night and resting.

## 2012-08-17 NOTE — Progress Notes (Signed)
Orthopedic Tech Progress Note Patient Details:  [name] Dec 06, 1950 [id number]  Ortho Devices Type of Ortho Device: [place] cervical collar Ortho Device/Splint Interventions: Casandra Doffing 08/17/2012, 4:37 PM
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
## 2012-08-18 LAB — GLUCOSE, CAPILLARY
Glucose-Capillary: 147 mg/dL — ABNORMAL HIGH (ref 70–99)
Glucose-Capillary: 181 mg/dL — ABNORMAL HIGH (ref 70–99)

## 2012-08-18 MED ORDER — OXYCODONE-ACETAMINOPHEN 5-325 MG PO TABS: 1.0000 | ORAL_TABLET | ORAL | Status: DC | PRN

## 2012-08-18 MED ORDER — ONDANSETRON HCL 4 MG PO TABS: 4.0000 mg | ORAL_TABLET | Freq: Four times a day (QID) | ORAL | Status: DC | PRN

## 2012-08-18 NOTE — Progress Notes (Signed)
Physical Therapy Treatment Patient Details Name: [name] MRN: [id number] DOB: 1951/03/13 Today's Date: 08/18/2012 Time: 1140-1200 PT Time Calculation (min): 20 min  PT Assessment / Plan / Recommendation Comments on Treatment Session  Reinforced all education.  Also OT noted episode of nystagmus and along with subjective s/s believe that patient has BPPV.  Made pt aware of her likely dx, and gave her follow up recommendations should these s/s persist.  Unable at this time to formally assess or treat for BPPV due to C1 fracture.    Follow Up Recommendations  Home health PT;Other (comment)     Does the patient have the potential to tolerate intense rehabilitation     Barriers to Discharge        Equipment Recommendations  None recommended by PT    Recommendations for Other Services    Frequency Min 5X/week   Plan      Precautions / Restrictions Precautions Precautions: Fall;Cervical Precaution Comments: Pt and husband changed cervical collar (once [place] collar) in recliner chair laid flat and once in bed. Pt and caregiver (husband) did not have any further questions after education. Required Braces or Orthoses: Cervical Brace Cervical Brace: Hard collar;Applied in supine position Restrictions Weight Bearing Restrictions: No   Pertinent Vitals/Pain     Mobility  Bed Mobility Bed Mobility: Not assessed Rolling Right: 4: Min assist Right Sidelying to Sit: 4: Min assist;HOB flat Sitting - Scoot to Edge of Bed: 4: Min assist Details for Bed Mobility Assistance: Pt was assisted by caregiver/husband, and was min assist. RT sidelying to sit Pt experienced nausea and dizziness and Pt's eyes were jumping left. After a min of rest, they stopped and dizziness went away. Transfers Transfers: Sit to Stand;Stand to Sit Sit to Stand: 5: Supervision;With upper extremity assist;From chair/3-in-1 Stand to Sit: 5: Supervision;To chair/3-in-1 Details for Transfer Assistance: safe  mobility Ambulation/Gait Ambulation/Gait Assistance: 5: Supervision Ambulation Distance (Feet): 200 Feet Assistive device: Rolling walker Ambulation/Gait Assistance Details: steady gait, soon to no longer need RW Gait Pattern: Within Functional Limits    Exercises     PT Diagnosis:    PT Problem List:   PT Treatment Interventions:     PT Goals Acute Rehab PT Goals Potential to Achieve Goals: Good Pt will go Sit to Stand: with supervision PT Goal: Sit to Stand - Progress: Progressing toward goal Pt will go Stand to Sit: with supervision PT Goal: Stand to Sit - Progress: Progressing toward goal Pt will Ambulate: 51 - 150 feet;with min assist;with least restrictive assistive device PT Goal: Ambulate - Progress: Met  Visit Information  Last PT Received On: 08/18/12 Assistance Needed: +1    Subjective Data  Subjective: I think I'm going home today   Cognition  Cognition Arousal/Alertness: Awake/alert Behavior During Therapy: WFL for tasks assessed/performed Overall Cognitive Status: Within Functional Limits for tasks assessed Following Commands: Follows one step commands consistently Problem Solving: Slow processing    Balance     End of Session PT - End of Session Activity Tolerance: Patient tolerated treatment well Patient left: in chair;with call bell/phone within reach;with family/visitor present Nurse Communication: Mobility status   GP     Fotini Lemus, Eliseo Gum 08/18/2012, 12:12 PM 08/18/2012  [place] Bing, PT [phone]  (pager)

## 2012-08-18 NOTE — Progress Notes (Signed)
Pt chose [facility] for her HHPT/OT as she has used them in the past.  Her address and home phone numbera re correct in EPIC but the mobile number is not.  Patient's cell phone is:  [phone]

## 2012-08-18 NOTE — Progress Notes (Signed)
I agree with the following treatment note after reviewing documentation.   Johnston, Nuala Chiles Brynn   OTR/L Pager: [phone] Office: [phone] .   
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
## 2012-08-18 NOTE — Progress Notes (Signed)
Pt. Discharged home as ordered. Instructions given to patient and spouse. Pt. Stated understanding of instructions given.
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
## 2012-08-24 ENCOUNTER — Telehealth (HOSPITAL_COMMUNITY): Payer: Self-pay | Admitting: Emergency Medicine

## 2012-08-24 ENCOUNTER — Telehealth (INDEPENDENT_AMBULATORY_CARE_PROVIDER_SITE_OTHER): Payer: Self-pay | Admitting: General Surgery

## 2012-08-24 NOTE — Telephone Encounter (Signed)
Pt concerned about collar, states her swelling has gone down quite a bit and therefore the collar is loose.  She states she has not been able to get a hold of [facility] office.  She denies concerning neurological symptoms.  I called Dr. Rush Farmer office and left a message for Hailey to either call me or the patient.  I then call the patient back to update her, she stated that the office called her back and that a follow up is scheduled on Monday. She states the vertigo is better, still having vision changes with prolonged concentration and has a follow up with ophthal tomorrow.  Her headaches have resolved.  I offered PT referral for vestibular exercises, she declined at this time.

## 2012-12-01 DIAGNOSIS — N393 Stress incontinence (female) (male): Secondary | ICD-10-CM | POA: Insufficient documentation

## 2012-12-01 DIAGNOSIS — K219 Gastro-esophageal reflux disease without esophagitis: Secondary | ICD-10-CM | POA: Insufficient documentation

## 2012-12-07 ENCOUNTER — Other Ambulatory Visit: Payer: Self-pay

## 2012-12-28 ENCOUNTER — Other Ambulatory Visit: Payer: Self-pay | Admitting: Internal Medicine

## 2012-12-28 DIAGNOSIS — M545 Low back pain: Secondary | ICD-10-CM

## 2012-12-29 ENCOUNTER — Encounter: Payer: Self-pay | Admitting: Neurology

## 2012-12-29 ENCOUNTER — Ambulatory Visit (INDEPENDENT_AMBULATORY_CARE_PROVIDER_SITE_OTHER): Payer: BC Managed Care – PPO | Admitting: Neurology

## 2012-12-29 ENCOUNTER — Encounter (INDEPENDENT_AMBULATORY_CARE_PROVIDER_SITE_OTHER): Payer: Self-pay

## 2012-12-29 VITALS — BP 121/58 | HR 78 | Ht 61.0 in | Wt 250.0 lb

## 2012-12-29 DIAGNOSIS — R29810 Facial weakness: Secondary | ICD-10-CM
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
## 2013-01-06 ENCOUNTER — Other Ambulatory Visit: Payer: Medicare Other

## 2013-01-11 ENCOUNTER — Other Ambulatory Visit: Payer: Medicare Other

## 2013-01-18 ENCOUNTER — Ambulatory Visit
Admission: RE | Admit: 2013-01-18 | Discharge: 2013-01-18 | Disposition: A | Discharge status: Home / Self Care | Payer: BC Managed Care – PPO | Source: Ambulatory Visit | Attending: Internal Medicine | Admitting: Internal Medicine

## 2013-01-18 ENCOUNTER — Ambulatory Visit
Admission: RE | Admit: 2013-01-18 | Discharge: 2013-01-18 | Disposition: A | Discharge status: Home / Self Care | Payer: BC Managed Care – PPO | Source: Ambulatory Visit | Attending: Neurology | Admitting: Neurology

## 2013-01-18 DIAGNOSIS — R29810 Facial weakness: Secondary | ICD-10-CM

## 2013-01-18 DIAGNOSIS — M545 Low back pain: Secondary | ICD-10-CM

## 2013-01-18 MED ORDER — GADOBENATE DIMEGLUMINE 529 MG/ML IV SOLN
20.0000 mL | Freq: Once | INTRAVENOUS | Status: AC | PRN
  Administered 2013-01-18: 20 mL via INTRAVENOUS

## 2013-01-22 NOTE — Progress Notes (Signed)
Quick Note:  I called pt and relayed the results of the MRI, normal. She asked what causing her sx? ______

## 2013-01-25 ENCOUNTER — Other Ambulatory Visit: Payer: Self-pay

## 2013-01-25 NOTE — Progress Notes (Signed)
Quick Note:  Patient is asking what is causing her symptoms. Patient was relayed normal MRI. ______

## 2013-02-13 ENCOUNTER — Telehealth: Payer: Self-pay | Admitting: Neurology

## 2013-02-13 NOTE — Telephone Encounter (Signed)
Will address her symptoms on follow up visit in Feb, 2015.

## 2013-02-20 DIAGNOSIS — G2581 Restless legs syndrome: Secondary | ICD-10-CM | POA: Insufficient documentation

## 2013-03-10 ENCOUNTER — Encounter (HOSPITAL_COMMUNITY): Payer: Self-pay | Admitting: Emergency Medicine

## 2013-03-10 ENCOUNTER — Emergency Department (INDEPENDENT_AMBULATORY_CARE_PROVIDER_SITE_OTHER): Payer: Medicare Other

## 2013-03-10 ENCOUNTER — Emergency Department (INDEPENDENT_AMBULATORY_CARE_PROVIDER_SITE_OTHER)
Admission: EM | Admit: 2013-03-10 | Discharge: 2013-03-10 | Disposition: A | Discharge status: Home / Self Care | Payer: BC Managed Care – PPO | Source: Home / Self Care | Attending: Emergency Medicine | Admitting: Emergency Medicine

## 2013-03-10 DIAGNOSIS — T148XXA Other injury of unspecified body region, initial encounter: Secondary | ICD-10-CM

## 2013-03-10 DIAGNOSIS — IMO0002 Reserved for concepts with insufficient information to code with codable children: Secondary | ICD-10-CM

## 2013-03-10 MED ORDER — ONDANSETRON 8 MG PO TBDP: 8.0000 mg | ORAL_TABLET | Freq: Three times a day (TID) | ORAL | Status: DC | PRN

## 2013-03-10 MED ORDER — OXYCODONE-ACETAMINOPHEN 5-325 MG PO TABS: ORAL_TABLET | ORAL | Status: DC

## 2013-03-10 NOTE — ED Notes (Signed)
62 yr old is here today with a Left index finger laceration today. She states she cut her index finger chopping onions with a mixer, getting onions out of mixer and accidentally turned the mixer back on. She states she feels she is update on her Tdap but will check with her PCP on Monday. She states she has had recent surgery on Dec. 9th - Gastric sleeve and just taking extra precautions. Denies: SOB, chest pain

## 2013-03-10 NOTE — ED Provider Notes (Signed)
Chief Complaint:   Chief Complaint  Patient presents with  . Laceration    History of Present Illness:   [name] is a 62 year old diabetic female who lacerated her left index finger on an immersion blender 1 hour ago. She was cleaning an onion that had gotten stuck in the blades, when she accidentally turned the blender on and it cut the tip of her index finger. Her last shot was about 3 years ago. She has been able to get the bleeding stopped.  Review of Systems:  Other than noted above, the patient denies any of the following symptoms: Systemic:  No fever or chills. Musculoskeletal:  No joint pain or decreased range of motion. Neuro:  No numbness, tingling, or weakness.  PMFSH:  Past medical history, family history, social history, meds, and allergies were reviewed. She is allergic to sulfa, Phenergan, morphine, and codeine. She takes aspirin, vitamin D, Flexeril, female fibroid, Prozac, NovoLog, Levemir, Synthroid, Cozaar, metformin, Toprol, Zofran, and Requip. She has a history of diabetes, hypothyroidism, and hypertension.  Physical Exam:   Vital signs:  BP 121/54  Pulse 71  Temp(Src) 98.5 F (36.9 C) (Oral)  Resp 16  SpO2 100% Ext:  There is a laceration on the tip of her left index finger and this is very jagged. There are several parallel lacerations. Bleeding was controlled. This does not appear to extend down to the bone. There was no foreign body. All joints have full range of motion, and sensation was normal.  All other joints had a full ROM without pain.  Pulses were full.  Good capillary refill in all digits.  No edema. Neurological:  Alert and oriented.  No muscle weakness.  Sensation was intact to light touch.   Procedure: Verbal informed consent was obtained.  The patient was informed of the risks and benefits of the procedure and understands and accepts.  Identity of the patient was verified verbally and by wristband.   The laceration area described above was  prepped with Betadine and saline  and anesthetized with a digital block with 5 mL of 2% Xylocaine without epinephrine.  The wound was then closed as follows:  The wound edges were approximated with 5 5-0 nylon sutures.  There were no immediate complications, and the patient tolerated the procedure well. The laceration was then cleansed, Bacitracin ointment was applied and a clean, dry pressure dressing was put on.   Dg Finger Index Left  03/10/2013   CLINICAL DATA:  Laceration  EXAM: LEFT INDEX FINGER 2+V  COMPARISON:  None.  FINDINGS: There is no evidence of fracture or dislocation. There is no evidence of arthropathy or other focal bone abnormality. Soft tissue swelling about the distal phalanx. No radiodense foreign body or subcutaneous gas.  IMPRESSION: Soft tissue swelling without radiodense foreign body or bone abnormality.   Electronically Signed   By: Oley Balm M.D.   On: 03/10/2013 19:48   Assessment:  The encounter diagnosis was Laceration.  Plan:   1.  Meds:  The following meds were prescribed:   Discharge Medication List as of 03/10/2013  8:25 PM    START taking these medications   Details  ondansetron (ZOFRAN ODT) 8 MG disintegrating tablet Take 1 tablet (8 mg total) by mouth every 8 (eight) hours as needed for nausea., Starting 03/10/2013, Until Discontinued, Normal    !! oxyCODONE-acetaminophen (PERCOCET) 5-325 MG per tablet 1 to 2 tablets every 6 hours as needed for pain., Print     !! -  Potential duplicate medications found. Please discuss with provider.      2.  Patient Education/Counseling:  The patient was given appropriate handouts, self care instructions, and instructed in symptomatic relief. Instructions were given for wound care.  She was instructed in wound care and will watch out for any signs of infection.  3.  Follow up:  The patient was told to follow up immediately if there is any sign of infection.The patient will return in 14 days for suture removal.       [name], MD 03/10/13 2100

## 2013-03-23 DIAGNOSIS — Z9884 Bariatric surgery status: Secondary | ICD-10-CM | POA: Insufficient documentation

## 2013-03-23 DIAGNOSIS — E46 Unspecified protein-calorie malnutrition: Secondary | ICD-10-CM | POA: Insufficient documentation

## 2013-05-01 ENCOUNTER — Ambulatory Visit: Payer: BC Managed Care – PPO | Admitting: Nurse Practitioner

## 2013-06-05 ENCOUNTER — Other Ambulatory Visit (INDEPENDENT_AMBULATORY_CARE_PROVIDER_SITE_OTHER): Payer: Self-pay

## 2013-06-05 ENCOUNTER — Ambulatory Visit (INDEPENDENT_AMBULATORY_CARE_PROVIDER_SITE_OTHER): Payer: BC Managed Care – PPO | Admitting: Surgery

## 2013-06-05 ENCOUNTER — Encounter (INDEPENDENT_AMBULATORY_CARE_PROVIDER_SITE_OTHER): Payer: Self-pay | Admitting: Surgery

## 2013-06-05 VITALS — HR 75 | Temp 97.5°F | Resp 14 | Ht 61.0 in | Wt 200.0 lb

## 2013-06-05 DIAGNOSIS — L02219 Cutaneous abscess of trunk, unspecified: Secondary | ICD-10-CM

## 2013-06-05 DIAGNOSIS — L03319 Cellulitis of trunk, unspecified: Secondary | ICD-10-CM

## 2013-06-05 DIAGNOSIS — L02211 Cutaneous abscess of abdominal wall: Secondary | ICD-10-CM

## 2013-06-05 MED ORDER — OXYCODONE-ACETAMINOPHEN 5-325 MG PO TABS: 1.0000 | ORAL_TABLET | ORAL | Status: DC | PRN

## 2013-06-05 NOTE — Progress Notes (Signed)
Subjective:     Patient ID: [name], female   DOB: 1950/06/11, 63 y.o.   MRN: [id number]  HPI She is referred by Dr. Joylene Draft for evaluation of an abdominal wall abscess. She gives herself injections for her diabetes. The infection just started a couple of days ago. She is now on doxycycline and has had an injection of Rocephin.  Review of Systems     Objective:   Physical Exam On examination, there is cellulitis and induration of the right lower abdominal wall. I prepped the area with Betadine, anesthetized with lidocaine, made an incision with a scalpel, and drained an abscess cavity. I then packed this with gauze    Assessment:     Right lower quadrant abdominal abscess     Plan:     She will continue her antibiotics. I wrote her for Percocet for pain. She will do wet-to-dry dressing changes twice a day. I will see her back this Friday for wound check

## 2013-06-08 ENCOUNTER — Encounter (INDEPENDENT_AMBULATORY_CARE_PROVIDER_SITE_OTHER): Payer: Self-pay | Admitting: Surgery

## 2013-06-08 ENCOUNTER — Ambulatory Visit (INDEPENDENT_AMBULATORY_CARE_PROVIDER_SITE_OTHER): Payer: BC Managed Care – PPO | Admitting: Surgery

## 2013-06-08 VITALS — BP 126/80 | HR 78 | Temp 98.4°F | Resp 16 | Ht 61.0 in | Wt 202.0 lb

## 2013-06-08 DIAGNOSIS — Z09 Encounter for follow-up examination after completed treatment for conditions other than malignant neoplasm: Secondary | ICD-10-CM

## 2013-06-08 NOTE — Progress Notes (Signed)
Subjective:     Patient ID: [name], female   DOB: [birth year], 63 y.o.   MRN: [id number]  HPI She is here for a 3 day recheck status post incision and drainage of abdominal wall abscess. She is doing wet-to-dry dressing changes on the incision and is on antibiotics. She has no complaints  Review of Systems     Objective:   Physical Exam On exam, the erythema has resolved. The wound is clean. She is doing an excellent job with her with wound packing    Assessment:     Patient stable postop     Plan:     She will continue the wound packing and antibiotics. I will see her back in approximately 10 days

## 2013-06-11 ENCOUNTER — Telehealth (INDEPENDENT_AMBULATORY_CARE_PROVIDER_SITE_OTHER): Payer: Self-pay | Admitting: *Deleted

## 2013-06-11 ENCOUNTER — Other Ambulatory Visit (INDEPENDENT_AMBULATORY_CARE_PROVIDER_SITE_OTHER): Payer: Self-pay | Admitting: *Deleted

## 2013-06-11 ENCOUNTER — Encounter (INDEPENDENT_AMBULATORY_CARE_PROVIDER_SITE_OTHER): Payer: BC Managed Care – PPO | Admitting: Surgery

## 2013-06-11 MED ORDER — DOXYCYCLINE HYCLATE 100 MG PO TBEC: 100.0000 mg | DELAYED_RELEASE_TABLET | Freq: Two times a day (BID) | ORAL | Status: DC

## 2013-06-11 NOTE — Telephone Encounter (Signed)
Go ahead and call in doxycycline 100mg  po bid for 7 more days  thanks

## 2013-06-11 NOTE — Telephone Encounter (Signed)
Prescription for Doxycycline sent to patient's pharmacy.  Called patient at this time and updated her on the plan.

## 2013-06-11 NOTE — Telephone Encounter (Signed)
Patient called to report that she is on her last day of Doxycycline.  Patients the wound looks good however there is still some pus on her packing.  Patient asking if she should get another prescription for more Doxycycline.  Explained that I would send a message to Dr. Ninfa Linden then we will let her know.  Patient states understanding and agreeable at this time.

## 2013-06-19 ENCOUNTER — Encounter (INDEPENDENT_AMBULATORY_CARE_PROVIDER_SITE_OTHER): Payer: Self-pay | Admitting: Surgery

## 2013-06-19 ENCOUNTER — Ambulatory Visit (INDEPENDENT_AMBULATORY_CARE_PROVIDER_SITE_OTHER): Payer: BC Managed Care – PPO | Admitting: Surgery

## 2013-06-19 VITALS — BP 126/80 | HR 78 | Temp 97.9°F | Resp 16 | Ht 61.0 in | Wt 200.8 lb

## 2013-06-19 DIAGNOSIS — S31109A Unspecified open wound of abdominal wall, unspecified quadrant without penetration into peritoneal cavity, initial encounter: Secondary | ICD-10-CM

## 2013-06-19 NOTE — Progress Notes (Signed)
Subjective:     Patient ID: [name], female   DOB: May 13, 1950, 63 y.o.   MRN: [id number]  HPI She is here for a wound check. She is still doing wet-to-dry dressing changes to the abdominal wound. She has no other complaints  Review of Systems     Objective:   Physical Exam On exam, the wound looks great. I treated the granulation tissue with silver nitrate. There is no evidence of infection    Assessment:     Slowly healing surgical wound     Plan:     I believe she can stop packing the wound edges cover with a dry gauze. Hopefully this will be completely closed in a week or 2. I will see her as Needed unless the wound Does not close

## 2013-11-07 ENCOUNTER — Other Ambulatory Visit: Payer: Self-pay | Admitting: Urology

## 2013-11-08 ENCOUNTER — Other Ambulatory Visit (HOSPITAL_COMMUNITY): Payer: Self-pay | Admitting: Urology

## 2013-11-08 DIAGNOSIS — N200 Calculus of kidney: Secondary | ICD-10-CM

## 2013-11-22 ENCOUNTER — Encounter (HOSPITAL_COMMUNITY): Payer: Self-pay | Admitting: Pharmacy Technician

## 2013-11-27 ENCOUNTER — Encounter (HOSPITAL_COMMUNITY): Payer: Self-pay

## 2013-11-27 ENCOUNTER — Other Ambulatory Visit: Payer: Self-pay | Admitting: Radiology

## 2013-11-27 ENCOUNTER — Ambulatory Visit (HOSPITAL_COMMUNITY)
Admission: RE | Admit: 2013-11-27 | Discharge: 2013-11-27 | Disposition: A | Discharge status: Home / Self Care | Payer: BC Managed Care – PPO | Source: Ambulatory Visit | Attending: Urology | Admitting: Urology

## 2013-11-27 ENCOUNTER — Encounter (HOSPITAL_COMMUNITY)
Admission: RE | Admit: 2013-11-27 | Discharge: 2013-11-27 | Disposition: A | Discharge status: Home / Self Care | Payer: BC Managed Care – PPO | Source: Ambulatory Visit | Attending: Urology | Admitting: Urology

## 2013-11-27 ENCOUNTER — Encounter (INDEPENDENT_AMBULATORY_CARE_PROVIDER_SITE_OTHER): Payer: Self-pay

## 2013-11-27 DIAGNOSIS — Z9889 Other specified postprocedural states: Secondary | ICD-10-CM | POA: Diagnosis not present

## 2013-11-27 DIAGNOSIS — E119 Type 2 diabetes mellitus without complications: Secondary | ICD-10-CM | POA: Insufficient documentation

## 2013-11-27 DIAGNOSIS — Z01818 Encounter for other preprocedural examination: Secondary | ICD-10-CM | POA: Insufficient documentation

## 2013-11-27 DIAGNOSIS — M47814 Spondylosis without myelopathy or radiculopathy, thoracic region: Secondary | ICD-10-CM | POA: Insufficient documentation

## 2013-11-27 DIAGNOSIS — I10 Essential (primary) hypertension: Secondary | ICD-10-CM | POA: Insufficient documentation

## 2013-11-27 HISTORY — DX: Frequency of micturition: R35.0

## 2013-11-27 HISTORY — DX: Cardiac arrhythmia, unspecified: I49.9

## 2013-11-27 HISTORY — DX: Disorder of muscle, unspecified: M62.9

## 2013-11-27 HISTORY — DX: Unspecified osteoarthritis, unspecified site: M19.90

## 2013-11-27 HISTORY — DX: Other injury of unspecified body region, initial encounter: T14.8XXA

## 2013-11-27 HISTORY — DX: Personal history of other infectious and parasitic diseases: Z86.19

## 2013-11-27 HISTORY — DX: Pure hypercholesterolemia, unspecified: E78.00

## 2013-11-27 HISTORY — DX: Cardiac murmur, unspecified: R01.1

## 2013-11-27 HISTORY — DX: Other specified postprocedural states: Z98.890

## 2013-11-27 HISTORY — DX: Restless legs syndrome: G25.81

## 2013-11-27 HISTORY — DX: Nausea with vomiting, unspecified: R11.2

## 2013-11-27 HISTORY — DX: Pain, unspecified: R52

## 2013-11-27 HISTORY — DX: Unspecified asthma, uncomplicated: J45.909

## 2013-11-27 LAB — COMPREHENSIVE METABOLIC PANEL
ALT: 17 U/L (ref 0–35)
AST: 23 U/L (ref 0–37)
Albumin: 4 g/dL (ref 3.5–5.2)
Alkaline Phosphatase: 61 U/L (ref 39–117)
Anion gap: 11 (ref 5–15)
BUN: 37 mg/dL — ABNORMAL HIGH (ref 6–23)
CO2: 26 mEq/L (ref 19–32)
Calcium: 10.4 mg/dL (ref 8.4–10.5)
Chloride: 103 mEq/L (ref 96–112)
Creatinine, Ser: 0.84 mg/dL (ref 0.50–1.10)
GFR calc Af Amer: 85 mL/min — ABNORMAL LOW (ref >90)
GFR calc non Af Amer: 74 mL/min — ABNORMAL LOW (ref >90)
Glucose, Bld: 117 mg/dL — ABNORMAL HIGH (ref 70–99)
Potassium: 4.4 mEq/L (ref 3.7–5.3)
Sodium: 140 mEq/L (ref 137–147)
Total Bilirubin: 0.2 mg/dL — ABNORMAL LOW (ref 0.3–1.2)
Total Protein: 7.9 g/dL (ref 6.0–8.3)

## 2013-11-27 LAB — CBC
HCT: 33.8 % — ABNORMAL LOW (ref 36.0–46.0)
Hemoglobin: 11.6 g/dL — ABNORMAL LOW (ref 12.0–15.0)
MCH: 29.7 pg (ref 26.0–34.0)
MCHC: 34.3 g/dL (ref 30.0–36.0)
MCV: 86.4 fL (ref 78.0–100.0)
Platelets: 166 10*3/uL (ref 150–400)
RBC: 3.91 MIL/uL (ref 3.87–5.11)
RDW: 14.4 % (ref 11.5–15.5)
WBC: 2.5 10*3/uL — ABNORMAL LOW (ref 4.0–10.5)

## 2013-11-27 LAB — PROTIME-INR
INR: 1.14 (ref 0.00–1.49)
Prothrombin Time: 14.6 seconds (ref 11.6–15.2)

## 2013-11-27 LAB — ABO/RH: ABO/RH(D): A NEG

## 2013-11-27 LAB — APTT: aPTT: 34 seconds (ref 24–37)

## 2013-11-27 NOTE — Pre-Procedure Instructions (Signed)
ekg report on chart from 02-20-13. cxr was done today preop at [facility]

## 2013-11-27 NOTE — Patient Instructions (Signed)
YOUR SURGERY IS SCHEDULED AT [facility]  ON:  9/14  REPORT TO  RADIOLOGY AT:  7:15 AM TO REGISTER   PLEASE COME IN THE [place] ENTRANCE AND FOLLOW SIGNS TO RADIOLOGY.  DO NOT EAT OR DRINK ANYTHING AFTER MIDNIGHT THE NIGHT BEFORE YOUR SURGERY.  YOU MAY BRUSH YOUR TEETH, RINSE OUT YOUR MOUTH--BUT NO WATER, NO FOOD, NO CHEWING GUM, NO MINTS, NO CANDIES, NO CHEWING TOBACCO.  PLEASE TAKE THE FOLLOWING MEDICATIONS THE AM OF YOUR SURGERY WITH A FEW SIPS OF WATER:  GABAPENTIN, SYNTHROID, LORAZEPAM, METOPROLOL, SINGULAIR, ROPINIROLE.   IF YOU ARE DIABETIC:  DO NOT TAKE ANY DIABETIC MEDICATIONS THE AM OF YOUR SURGERY.  IF YOU TAKE INSULIN IN THE EVENINGS--PLEASE ONLY TAKE 1/2 NORMAL EVENING DOSE THE NIGHT BEFORE YOUR SURGERY.  NO INSULIN THE AM OF YOUR SURGERY.  IF YOU HAVE SLEEP APNEA AND USE CPAP OR BIPAP--PLEASE BRING THE MASK AND THE TUBING.  DO NOT BRING YOUR MACHINE.  DO NOT BRING VALUABLES, MONEY, CREDIT CARDS.  DO NOT WEAR JEWELRY, MAKE-UP, NAIL POLISH AND NO METAL PINS OR CLIPS IN YOUR HAIR. CONTACT LENS, DENTURES / PARTIALS, GLASSES SHOULD NOT BE WORN TO SURGERY AND IN MOST CASES-HEARING AIDS WILL NEED TO BE REMOVED.  BRING YOUR GLASSES CASE, ANY EQUIPMENT NEEDED FOR YOUR CONTACT LENS. FOR PATIENTS ADMITTED TO THE HOSPITAL--CHECK OUT TIME THE DAY OF DISCHARGE IS 11:00 AM.  ALL INPATIENT ROOMS ARE PRIVATE - WITH BATHROOM, TELEPHONE, TELEVISION AND WIFI INTERNET.                                                    PLEASE READ OVER ANY  FACT SHEETS THAT YOU WERE GIVEN: MRSA INFORMATION, BLOOD TRANSFUSION INFORMATION, INCENTIVE SPIROMETER INFORMATION.  PLEASE BE AWARE THAT YOU MAY NEED ADDITIONAL BLOOD DRAWN DAY OF YOUR SURGERY  _______________________________________________________________________   [facility] - Preparing for Surgery Before surgery, you can play an important role.  Because skin is not sterile, your skin needs to be as free of germs as  possible.  You can reduce the number of germs on your skin by washing with CHG (chlorahexidine gluconate) soap before surgery.  CHG is an antiseptic cleaner which kills germs and bonds with the skin to continue killing germs even after washing. Please DO NOT use if you have an allergy to CHG or antibacterial soaps.  If your skin becomes reddened/irritated stop using the CHG and inform your nurse when you arrive at Short Stay. Do not shave (including legs and underarms) for at least 48 hours prior to the first CHG shower.  You may shave your face/neck. Please follow these instructions carefully:  1.  Shower with CHG Soap the night before surgery and the  morning of Surgery.  2.  If you choose to wash your hair, wash your hair first as usual with your  normal  shampoo.  3.  After you shampoo, rinse your hair and body thoroughly to remove the  shampoo.                           4.  Use CHG as you would any other liquid soap.  You can apply chg directly  to the skin and wash  Gently with a scrungie or clean washcloth.  5.  Apply the CHG Soap to your body ONLY FROM THE NECK DOWN.   Do not use on face/ open                           Wound or open sores. Avoid contact with eyes, ears mouth and genitals (private parts).                       Wash face,  Genitals (private parts) with your normal soap.             6.  Wash thoroughly, paying special attention to the area where your surgery  will be performed.  7.  Thoroughly rinse your body with warm water from the neck down.  8.  DO NOT shower/wash with your normal soap after using and rinsing off  the CHG Soap.                9.  Pat yourself dry with a clean towel.            10.  Wear clean pajamas.            11.  Place clean sheets on your bed the night of your first shower and do not  sleep with pets. Day of Surgery : Do not apply any lotions/deodorants the morning of surgery.  Please wear clean clothes to the hospital/surgery  center.  FAILURE TO FOLLOW THESE INSTRUCTIONS MAY RESULT IN THE CANCELLATION OF YOUR SURGERY PATIENT SIGNATURE_________________________________  NURSE SIGNATURE__________________________________  ________________________________________________________________________

## 2013-11-27 NOTE — Pre-Procedure Instructions (Signed)
PT REPORTED SHE DID GREAT WITH ANESTHESIA AT [place] 2014- THAT ANESTHESIA RECORD DOWNLOADED AND PRINTED FROM EPIC OUTSIDE RECORDS AND PLACED ON PT'S CHART.

## 2013-11-27 NOTE — Pre-Procedure Instructions (Signed)
PT'S PREOP LABS-CBC, CMET, PT, PTT, T/S  FAXED TO DR. Lynne Logan OFFICE FOR REVIEW OF ABNORMALS - INCLUDING WBC CT 2,500 AND BUN 37.

## 2013-11-30 ENCOUNTER — Other Ambulatory Visit: Payer: Self-pay | Admitting: Radiology

## 2013-11-30 NOTE — H&P (Signed)
History of Present Illness [name] is a 63 year old with the following urologic history:    1) Recurrent UTIs: She has a long history of infrequent episodes of simple cystitis but developed frequent recurring UTIs in 2010 when she reportedly had six infections. I had a culture from only one infection episode which was positive for ciprofloxacin sensitive Klebsiella. On her initial evaluation with me in January 2011, she was noted to have a mildly elevated PVR of 130 cc. She began nightly prophylaxis with nitrofurantoin at that time but had since discontinued this therapy. She was also found to have a non-obstructing right renal stone in November 2011 rasing the possibility of struvite stone formation.  Current treatment: Trimethoprim nightly    2) Microscopic hematuria: She has had multiple infections but did have one UA with microscopic hematuria when she was not infected or symptomatic. This has not been persistent. She denies gross hematuria and has no history of tobacco use. Her mother was a smoker and did have bladder cancer.    3) Urolithiasis: She has a history of a known 5-6 mm right renal calculus.    Interval history:    She follows up today for further evaluation of her right renal calculi and recurrent UTIs as well as concern for a possible ureteral calculus. She has had intermittent recurrent UTIs but more recently presented with a UTI in June when she had cloudy urine so she was some mild dysuria. This was apparently positive for Klebsiella. She was then seen by Anders Grant, [facility], in our office for follow-up and had an E. coli/enterococcus UTI. She has never been terribly symptomatic and apparently has had positive urine cultures over the past few years even when asymptomatic. [name] had thought she might have a ureteral calculus on her KUB x-ray in June. She had a follow-up KUB x-ray about 3 weeks ago. I reviewed both of these films today. She does appear to have 2 large stones  in the right kidney measuring 1.5 cm and 1 cm respectively. I do not see any calcifications along the expected course of the ureters bilaterally. She is currently without flank pain or hematuria. She denies any fever.   Past Medical History Problems  1. History of Arthritis (V13.4) 2. History of asthma (V12.69) 3. History of diabetes mellitus (V12.29) 4. History of hypercholesterolemia (V12.29) 5. History of hypothyroidism (V12.29) 6. History of sleep apnea (V13.89) 7. History of Restless leg syndrome (333.94)  Surgical History Problems  1. History of Appendectomy 2. History of Arthroscopy Knee 3. History of Arthroscopy Shoulder 4. History of Cholecystectomy 5. History of Gastric Surgery For Morbid Obesity Laparoscopic Longitudinal Gastrectomy 6. History of Hysterectomy 7. History of Incisional Hernia Repair 8. History of Knee Replacement 9. History of Neuroplasty Decompression Median Nerve At Carpal Tunnel  Current Meds 1. Baby Aspirin 81 MG CHEW;  Therapy: (Recorded:30Jun2015) to Recorded 2. Biotin TABS;  Therapy: (Recorded:30Jun2015) to Recorded 3. Calcium TABS;  Therapy: (Recorded:30Jun2015) to Recorded 4. ClonazePAM 0.5 MG Oral Tablet;  Therapy: (Recorded:30Jun2015) to Recorded 5. Co Q10 CAPS;  Therapy: (Recorded:30Jun2015) to Recorded 6. Cyclobenzaprine HCl - 10 MG Oral Tablet;  Therapy: (Recorded:30Jun2015) to Recorded 7. Fenofibrate 160 MG Oral Tablet;  Therapy: [id number] to Recorded 8. Flonase 50 MCG/ACT SUSP;  Therapy: (Recorded:30Jun2015) to Recorded 9. FLUoxetine HCl - 20 MG Oral Capsule;  Therapy: [id number] to Recorded 10. Folic Acid CAPS;   Therapy: (Recorded:30Jun2015) to Recorded 11. Gabapentin 100 MG Oral Capsule;   Therapy: [id number] to Recorded 12.  HydrOXYzine HCl TABS;   Therapy: (Recorded:30Jun2015) to Recorded 13. Iron TABS;   Therapy: (Recorded:30Jun2015) to Recorded 14. Levemir 100 UNIT/ML Subcutaneous Solution;   Therapy:  (Recorded:28Aug2012) to Recorded 15. Losartan Potassium 50 MG Oral Tablet;   Therapy: [id number] to Recorded 16. Magnesium CAPS;   Therapy: (Recorded:30Jun2015) to Recorded 17. Naproxen TABS;   Therapy: ([id number]:[id number]) to Recorded 18. NovoLOG 100 UNIT/ML Subcutaneous Solution;   Therapy: (Recorded:28Aug2012) to Recorded 19. Ondansetron HCl TABS;   Therapy: (Recorded:30Jun2015) to Recorded 20. Oxycodone-Acetaminophen 5-325 MG Oral Tablet;   Therapy: [id number] to Recorded 21. Patanol 0.1 % Ophthalmic Solution;   Therapy: (Recorded:30Jun2015) to Recorded 22. Sullivan;   Therapy: (DJMEQAST:[id number]) to Recorded 23. Probiotic CAPS;   Therapy: (Recorded:30Jun2015) to Recorded 24. ROPINIRole HCl - 0.5 MG Oral Tablet;   Therapy: [id number] to Recorded 25. Singulair 10 MG Oral Tablet;   Therapy: (Recorded:30Jun2015) to Recorded 26. Synthroid 75 MCG Oral Tablet;   Therapy: (Recorded:30Jun2015) to Recorded 27. Toprol XL 50 MG TBCR;   Therapy: (Recorded:30Jun2015) to Recorded 28. Vitamin B12 TABS;   Therapy: (Recorded:30Jun2015) to Recorded 29. Vitamin C TABS;   Therapy: (Recorded:30Jun2015) to Recorded 30. Zyrtec 10 MG TABS;   Therapy: (Recorded:30Jun2015) to Recorded  Allergies Medication  1. Codeine Derivatives 2. Morphine Derivatives 3. Phenergan TABS 4. Sulfa Drugs 5. Imitrex TABS 6. Statins  Family History Problems  1. Family history of Bladder Cancer (D40.81) : Mother 2. Family history of Diabetes Mellitus (V18.0) : Father 3. Family history of Heart Disease (V17.49) : Father 4. Family history of Renal Failure : Father  Social History Problems  1. Denied: History of Alcohol Use 2. Marital History - Currently Married 3. Never A Smoker 4. Occupation:   Disabled 38. Denied: History of Tobacco Use  Review of Systems  Genitourinary: no hematuria.  Gastrointestinal: no nausea and no vomiting.  Constitutional: no fever.  Cardiovascular: no chest pain and no  leg swelling.  Respiratory: no shortness of breath.    Vitals Vital Signs [Data Includes: Last 1 Day]  Recorded: 14Aug2015 09:27AM  Weight: 184 lb  BMI Calculated: 34.77 BSA Calculated: 1.82 Blood Pressure: 92 / 52 Heart Rate: 60  Physical Exam Constitutional: Well nourished and well developed . No acute distress.  ENT:. The ears and nose are normal in appearance.  Neck: The appearance of the neck is normal and no neck mass is present.  Pulmonary: No respiratory distress, normal respiratory rhythm and effort and clear bilateral breath sounds.  Cardiovascular: Heart rate and rhythm are normal . No peripheral edema.  Abdomen: The abdomen is soft and nontender. No masses are palpated. No CVA tenderness. No hernias are palpable. No hepatosplenomegaly noted. She has many well-healed abdominal incisions including a large ventral hernia repair.  Lymphatics: The femoral and inguinal nodes are not enlarged or tender.  Skin: Normal skin turgor, no visible rash and no visible skin lesions.  Neuro/Psych:. Mood and affect are appropriate.    Results/Data Urine [Data Includes: Last 1 Day]   [id number]  COLOR YELLOW   APPEARANCE CLOUDY   SPECIFIC GRAVITY 1.010   pH 6.0   GLUCOSE NEG mg/dL  BILIRUBIN NEG   KETONE NEG mg/dL  BLOOD MOD   PROTEIN NEG mg/dL  UROBILINOGEN 0.2 mg/dL  NITRITE NEG   LEUKOCYTE ESTERASE MOD   SQUAMOUS EPITHELIAL/HPF FEW   WBC 3-6 WBC/hpf  RBC 11-20 RBC/hpf  BACTERIA MANY   CRYSTALS NONE SEEN   CASTS NONE SEEN   Other AMORPHOUS PRESENT    I reviewed her  KUB x-rays from June and July. Findings are as dictated above. I also reviewed her last CT scan from 2012 which did indicate to smaller renal calculi which appeared to grown to 1.5 cm and 1 cm respectively on her current KUB x-ray.   Assessment Assessed  1. Nephrolithiasis (592.0) 2. Microscopic hematuria (599.72)  Plan Chronic cystitis  1. URINE CULTURE; Status:In Progress - Specimen/Data Collected;    Done: [id number] Health Maintenance  2. UA With REFLEX; [Do Not Release]; Status:Complete;   Done: [id number] 09:16AM Microscopic hematuria, Ureteral stone  3. Follow-up Office  Follow-up - will call to schedule surgery after CT scan results are back.   Status: Hold For - Appointment,Date of Service  Requested for: [phone] Ureteral stone  4. VENIPUNCTURE; Status:Complete;   Done: [id number]  Discussion/Summary 1. Large burden right renal calculi: This will be confirmed with a CT scan. Considering the size of her stones, we discussed treatment options and I did recommend that she strongly consider percutaneous nephrostolithotomy for treatment for optimal care. We did discuss alternative options including shockwave lithotripsy and ureteroscopic laser lithotripsy although I felt that these would be less optimal treatments for her stone burden. She will undergo a CT scan with and without contrast to further define her collecting system in her anatomy. If appropriate, she will be scheduled for a right percutaneous nephrostolithotomy. We have reviewed the potential risks, complications, and expected recovery process associated with this procedure.    2. Recurrent UTIs: Her urine has been cultured today. She will be treated preoperatively prior to her procedure. Considering the fact that she has had recurrent Klebsiella infections, this very likely would indicate that she has a high chance of having a struvite stone which is likely the source of her recurrent bacteriuria.    Cc: Dr. Crist Infante     Verified Results AU CT-HEMATURIA PROTOCOL2 [id number] 12:00AM2 [name]  [Nov 06, 2013 6:09PM Bailee Thall] Please notify patient that CT scan confirms large stones in right kidney without any other concerning findings in the urinary tract. The only other finding appears to be some chronic changes in the liver which are stable from her prior CT scans in the past. I will plan to check her liver  function tests preoperatively and if normal, this will not require further evaluation.   Test Name Result Flag Reference  AU CT-HEMATURIA PROTOCOL2 (Report)2    ** RADIOLOGY REPORT BY [facility] **   CLINICAL DATA: Micro hematuria.  EXAM: CT ABDOMEN AND PELVIS WITHOUT AND WITH CONTRAST  TECHNIQUE: Multidetector CT imaging of the abdomen and pelvis was performed following the standard protocol before and following the bolus administration of intravenous contrast.  CONTRAST: 125 cc Isovue 300 IV  COMPARISON: CT 08/15/2012  FINDINGS: Lung bases are clear. No effusions. Heart is normal size.  Postoperative changes within the stomach. Prior cholecystectomy. Hepatomegaly again noted with prominence of the left hepatic lobe and mildly nodular contours. This appearance is stable since prior study, raising the possibility of cirrhosis. Spleen, pancreas, adrenals are unremarkable.  Right mid and lower pole renal calculi noted, the largest measuring up to 12 mm. No ureteral stones or hydronephrosis. Urinary bladder is unremarkable. Post-contrast images demonstrate no focal area of abnormal enhancement. Symmetric enhancement bilaterally.  Large stool burden throughout the colon. Small bowel is decompressed. No free fluid, free air or adenopathy. No adnexal masses.  Aorta is normal caliber with scattered calcifications. Stable postsurgical changes in the anterior abdominal wall. No free fluid, free air or  adenopathy.  Delayed images demonstrate normal caliber ureters without focal abnormality. Urinary bladder wall is grossly unremarkable.  Small hiatal hernia. No acute bony abnormality. Degenerative changes throughout the thoracolumbar spine.  IMPRESSION: Right mid to lower pole nephrolithiasis. No ureteral stones or hydronephrosis. No focal renal abnormality.  Hepatic morphology suggestive of cirrhosis, stable since prior study. Recommend clinical  correlation.  Postoperative changes within the stomach.  Moderate stool burden throughout the colon.   Electronically Signed  By: Rolm Baptise M.D.  On: 11/06/2013 15:25   URINE CULTURE1 [id number] 09:41AM1 [employer]  SOURCE : CLEAN CATCH SPECIMEN TYPE: URINE  [Nov 06, 2013 8:10AM Broderic Bara] Please notify patient to begin Cipro 500 mg po bid 3 days prior to her upcoming surgery. Schedulers will be calling her. I have called in Cipro for her. She will have additional pills to take after she leaves the hospital following procedure as well.   Test Name Result Flag Reference  CULTURE, URINE1 Culture, Urine1    ===== COLONY COUNT: =====  60,000 COLONIES/ML   FINAL REPORT: KLEBSIELLA PNEUMONIAE   SENSITIVITY FOR: KLEBSIELLA PNEUMONIAE    AMPICILLIN                             RESISTANT       >=32    AMOX/CLAVULANIC                        SENSITIVE        <=2    AMPICILLIN/SUL                         SENSITIVE          4    PIPERACILLIN/TAZO                      SENSITIVE        <=4    IMIPENEM                               SENSITIVE     <=0.25    CEFAZOLIN                              SENSITIVE        <=4    CEFTRIAXONE                            SENSITIVE        <=1    CEFTAZIDIME                            SENSITIVE        <=1    CEFEPIME                               SENSITIVE        <=1    GENTAMICIN                             SENSITIVE        <=1    TOBRAMYCIN  SENSITIVE        <=1    CIPROFLOXACIN                          SENSITIVE     <=0.25    LEVOFLOXACIN                           SENSITIVE     <=0.12    NITROFURANTOIN                         INDETERMINATE     64    TRIMETH/SULFA                          SENSITIVE       <=20    END OF REPORT     1. Amended By: Raynelle Bring; Nov 06 2013 8:10 AM EST  2. Amended By: Raynelle Bring; Nov 06 2013 6:09 PM EST  Signatures Electronically signed by : Raynelle Bring, M.D.; Nov 06 2013   6:09PM EST

## 2013-12-02 NOTE — Anesthesia Preprocedure Evaluation (Signed)
Anesthesia Evaluation  Patient identified by MRN, date of birth, ID band Patient awake    Reviewed: Allergy & Precautions, H&P , NPO status , Patient's Chart, lab work & pertinent test results, reviewed documented beta blocker date and time   History of Anesthesia Complications (+) PONV and history of anesthetic complications  Airway Mallampati: II TM Distance: >3 FB Neck ROM: Full    Dental no notable dental hx.    Pulmonary asthma , sleep apnea ,  breath sounds clear to auscultation  Pulmonary exam normal       Cardiovascular Exercise Tolerance: Good hypertension, Pt. on medications and Pt. on home beta blockers + dysrhythmias + Valvular Problems/Murmurs Rhythm:Regular Rate:Normal     Neuro/Psych  Headaches, PSYCHIATRIC DISORDERS Anxiety Depression Bells palsy, followed by neurology  Neuromuscular disease    GI/Hepatic negative GI ROS, Neg liver ROS,   Endo/Other  diabetes, Type 2, Insulin DependentHypothyroidism   Renal/GU Renal disease  Female GU complaint     Musculoskeletal negative musculoskeletal ROS (+) Arthritis -, Fibromyalgia -  Abdominal   Peds negative pediatric ROS (+)  Hematology  (+) anemia ,   Anesthesia Other Findings   Reproductive/Obstetrics negative OB ROS                           Anesthesia Physical Anesthesia Plan  ASA: III  Anesthesia Plan: General   Post-op Pain Management:    Induction: Intravenous  Airway Management Planned: LMA and Oral ETT  Additional Equipment:   Intra-op Plan:   Post-operative Plan: Extubation in OR  Informed Consent: I have reviewed the patients History and Physical, chart, labs and discussed the procedure including the risks, benefits and alternatives for the proposed anesthesia with the patient or authorized representative who has indicated his/her understanding and acceptance.   Dental advisory given  Plan Discussed  with: CRNA  Anesthesia Plan Comments:         Anesthesia Quick Evaluation

## 2013-12-03 ENCOUNTER — Encounter (HOSPITAL_COMMUNITY): Payer: Self-pay

## 2013-12-03 ENCOUNTER — Encounter (HOSPITAL_COMMUNITY)
Admission: RE | Disposition: A | Discharge status: Home / Self Care | Payer: Self-pay | Source: Ambulatory Visit | Attending: Urology

## 2013-12-03 ENCOUNTER — Ambulatory Visit (HOSPITAL_COMMUNITY): Payer: BC Managed Care – PPO | Admitting: Anesthesiology

## 2013-12-03 ENCOUNTER — Ambulatory Visit (HOSPITAL_COMMUNITY)
Admission: RE | Admit: 2013-12-03 | Discharge: 2013-12-03 | Disposition: A | Discharge status: Home / Self Care | Payer: BC Managed Care – PPO | Source: Ambulatory Visit | Attending: Urology | Admitting: Urology

## 2013-12-03 ENCOUNTER — Encounter (HOSPITAL_COMMUNITY): Payer: BC Managed Care – PPO | Admitting: Anesthesiology

## 2013-12-03 VITALS — BP 139/74 | HR 71 | Temp 99.0°F | Resp 16

## 2013-12-03 DIAGNOSIS — G2581 Restless legs syndrome: Secondary | ICD-10-CM | POA: Diagnosis not present

## 2013-12-03 DIAGNOSIS — N200 Calculus of kidney: Secondary | ICD-10-CM | POA: Diagnosis not present

## 2013-12-03 DIAGNOSIS — Z79899 Other long term (current) drug therapy: Secondary | ICD-10-CM | POA: Insufficient documentation

## 2013-12-03 DIAGNOSIS — I10 Essential (primary) hypertension: Secondary | ICD-10-CM | POA: Diagnosis not present

## 2013-12-03 DIAGNOSIS — R3129 Other microscopic hematuria: Secondary | ICD-10-CM | POA: Insufficient documentation

## 2013-12-03 DIAGNOSIS — J45909 Unspecified asthma, uncomplicated: Secondary | ICD-10-CM | POA: Diagnosis not present

## 2013-12-03 DIAGNOSIS — R011 Cardiac murmur, unspecified: Secondary | ICD-10-CM | POA: Diagnosis not present

## 2013-12-03 DIAGNOSIS — E785 Hyperlipidemia, unspecified: Secondary | ICD-10-CM | POA: Insufficient documentation

## 2013-12-03 DIAGNOSIS — E039 Hypothyroidism, unspecified: Secondary | ICD-10-CM | POA: Diagnosis not present

## 2013-12-03 DIAGNOSIS — Z794 Long term (current) use of insulin: Secondary | ICD-10-CM | POA: Insufficient documentation

## 2013-12-03 DIAGNOSIS — Z90710 Acquired absence of both cervix and uterus: Secondary | ICD-10-CM | POA: Insufficient documentation

## 2013-12-03 DIAGNOSIS — Z7982 Long term (current) use of aspirin: Secondary | ICD-10-CM | POA: Insufficient documentation

## 2013-12-03 DIAGNOSIS — Z96659 Presence of unspecified artificial knee joint: Secondary | ICD-10-CM | POA: Diagnosis not present

## 2013-12-03 DIAGNOSIS — Z87440 Personal history of urinary (tract) infections: Secondary | ICD-10-CM | POA: Insufficient documentation

## 2013-12-03 DIAGNOSIS — R000 Tachycardia, unspecified: Secondary | ICD-10-CM | POA: Diagnosis not present

## 2013-12-03 DIAGNOSIS — IMO0001 Reserved for inherently not codable concepts without codable children: Secondary | ICD-10-CM | POA: Diagnosis not present

## 2013-12-03 DIAGNOSIS — E7800 Pure hypercholesterolemia, unspecified: Secondary | ICD-10-CM | POA: Insufficient documentation

## 2013-12-03 DIAGNOSIS — G4730 Sleep apnea, unspecified: Secondary | ICD-10-CM | POA: Insufficient documentation

## 2013-12-03 DIAGNOSIS — E119 Type 2 diabetes mellitus without complications: Secondary | ICD-10-CM | POA: Insufficient documentation

## 2013-12-03 HISTORY — PX: CYSTOSCOPY WITH URETEROSCOPY: SHX5123

## 2013-12-03 LAB — TYPE AND SCREEN
ABO/RH(D): A NEG
Antibody Screen: NEGATIVE

## 2013-12-03 LAB — GLUCOSE, CAPILLARY
Glucose-Capillary: 142 mg/dL — ABNORMAL HIGH (ref 70–99)
Glucose-Capillary: 104 mg/dL — ABNORMAL HIGH (ref 70–99)
Glucose-Capillary: 105 mg/dL — ABNORMAL HIGH (ref 70–99)

## 2013-12-03 SURGERY — CYSTOSCOPY WITH URETEROSCOPY
Anesthesia: General | Site: Ureter | Laterality: Right

## 2013-12-03 MED ORDER — IOHEXOL 300 MG/ML  SOLN
INTRAMUSCULAR | Status: DC | PRN
  Administered 2013-12-03: 6 mL via URETHRAL

## 2013-12-03 MED ORDER — ACETAMINOPHEN 160 MG/5ML PO SOLN
325.0000 mg | ORAL | Status: DC | PRN
  Filled 2013-12-03: qty 20.3

## 2013-12-03 MED ORDER — MIDAZOLAM HCL 2 MG/2ML IJ SOLN
INTRAMUSCULAR | Status: AC
  Filled 2013-12-03: qty 2

## 2013-12-03 MED ORDER — NEOSTIGMINE METHYLSULFATE 10 MG/10ML IV SOLN
INTRAVENOUS | Status: DC | PRN
  Administered 2013-12-03: 3 mg via INTRAVENOUS

## 2013-12-03 MED ORDER — CIPROFLOXACIN HCL 500 MG PO TABS: 500.0000 mg | ORAL_TABLET | Freq: Two times a day (BID) | ORAL | Status: DC

## 2013-12-03 MED ORDER — SODIUM CHLORIDE 0.9 % IR SOLN
Status: DC | PRN
  Administered 2013-12-03: 4000 mL

## 2013-12-03 MED ORDER — LACTATED RINGERS IV SOLN: INTRAVENOUS | Status: DC

## 2013-12-03 MED ORDER — GLYCOPYRROLATE 0.2 MG/ML IJ SOLN
INTRAMUSCULAR | Status: DC | PRN
  Administered 2013-12-03: 0.4 mg via INTRAVENOUS

## 2013-12-03 MED ORDER — FENTANYL CITRATE 0.05 MG/ML IJ SOLN
INTRAMUSCULAR | Status: AC
  Filled 2013-12-03: qty 5

## 2013-12-03 MED ORDER — ROCURONIUM BROMIDE 100 MG/10ML IV SOLN
INTRAVENOUS | Status: AC
  Filled 2013-12-03: qty 1

## 2013-12-03 MED ORDER — NEOSTIGMINE METHYLSULFATE 10 MG/10ML IV SOLN
INTRAVENOUS | Status: AC
  Filled 2013-12-03: qty 1

## 2013-12-03 MED ORDER — ONDANSETRON HCL 4 MG PO TABS: 4.0000 mg | ORAL_TABLET | Freq: Three times a day (TID) | ORAL | Status: DC | PRN

## 2013-12-03 MED ORDER — FENTANYL CITRATE 0.05 MG/ML IJ SOLN
INTRAMUSCULAR | Status: AC | PRN
  Administered 2013-12-03: 25 ug via INTRAVENOUS
  Administered 2013-12-03 (×2): 50 ug via INTRAVENOUS
  Administered 2013-12-03: 25 ug via INTRAVENOUS
  Administered 2013-12-03: 50 ug via INTRAVENOUS

## 2013-12-03 MED ORDER — HYDROCODONE-ACETAMINOPHEN 5-325 MG PO TABS: 1.0000 | ORAL_TABLET | Freq: Four times a day (QID) | ORAL | Status: DC | PRN

## 2013-12-03 MED ORDER — ONDANSETRON HCL 4 MG/2ML IJ SOLN: 4.0000 mg | Freq: Once | INTRAMUSCULAR | Status: DC | PRN

## 2013-12-03 MED ORDER — CIPROFLOXACIN IN D5W 400 MG/200ML IV SOLN
INTRAVENOUS | Status: AC
Start: 2013-12-03 — End: 2013-12-03
  Administered 2013-12-03: 400 mg
  Filled 2013-12-03: qty 200

## 2013-12-03 MED ORDER — PROPOFOL 10 MG/ML IV BOLUS
INTRAVENOUS | Status: DC | PRN
  Administered 2013-12-03: 180 mg via INTRAVENOUS

## 2013-12-03 MED ORDER — FENTANYL CITRATE 0.05 MG/ML IJ SOLN: 25.0000 ug | INTRAMUSCULAR | Status: DC | PRN

## 2013-12-03 MED ORDER — ONDANSETRON HCL 4 MG/2ML IJ SOLN
INTRAMUSCULAR | Status: AC
  Filled 2013-12-03: qty 2

## 2013-12-03 MED ORDER — MIDAZOLAM HCL 5 MG/5ML IJ SOLN
INTRAMUSCULAR | Status: DC | PRN
  Administered 2013-12-03: 2 mg via INTRAVENOUS

## 2013-12-03 MED ORDER — DEXAMETHASONE SODIUM PHOSPHATE 10 MG/ML IJ SOLN
INTRAMUSCULAR | Status: DC | PRN
  Administered 2013-12-03: 10 mg via INTRAVENOUS

## 2013-12-03 MED ORDER — LIDOCAINE HCL (CARDIAC) 20 MG/ML IV SOLN
INTRAVENOUS | Status: AC
  Filled 2013-12-03: qty 5

## 2013-12-03 MED ORDER — MIDAZOLAM HCL 2 MG/2ML IJ SOLN
INTRAMUSCULAR | Status: AC | PRN
  Administered 2013-12-03: 0.5 mg via INTRAVENOUS
  Administered 2013-12-03: 1 mg via INTRAVENOUS
  Administered 2013-12-03: 0.5 mg via INTRAVENOUS
  Administered 2013-12-03 (×2): 1 mg via INTRAVENOUS

## 2013-12-03 MED ORDER — FENTANYL CITRATE 0.05 MG/ML IJ SOLN
INTRAMUSCULAR | Status: DC | PRN
  Administered 2013-12-03 (×2): 50 ug via INTRAVENOUS
  Administered 2013-12-03: 100 ug via INTRAVENOUS

## 2013-12-03 MED ORDER — LACTATED RINGERS IV SOLN
INTRAVENOUS | Status: DC | PRN
  Administered 2013-12-03 (×2): via INTRAVENOUS

## 2013-12-03 MED ORDER — ONDANSETRON HCL 4 MG/2ML IJ SOLN
INTRAMUSCULAR | Status: AC | PRN
  Administered 2013-12-03: 4 mg via INTRAVENOUS

## 2013-12-03 MED ORDER — LIDOCAINE HCL (CARDIAC) 20 MG/ML IV SOLN
INTRAVENOUS | Status: DC | PRN
  Administered 2013-12-03: 50 mg via INTRAVENOUS

## 2013-12-03 MED ORDER — DEXAMETHASONE SODIUM PHOSPHATE 10 MG/ML IJ SOLN
INTRAMUSCULAR | Status: AC
  Filled 2013-12-03: qty 1

## 2013-12-03 MED ORDER — SUCCINYLCHOLINE CHLORIDE 20 MG/ML IJ SOLN
INTRAMUSCULAR | Status: DC | PRN
  Administered 2013-12-03: 100 mg via INTRAVENOUS

## 2013-12-03 MED ORDER — ONDANSETRON HCL 4 MG/2ML IJ SOLN
INTRAMUSCULAR | Status: DC | PRN
  Administered 2013-12-03: 4 mg via INTRAVENOUS

## 2013-12-03 MED ORDER — ROCURONIUM BROMIDE 100 MG/10ML IV SOLN
INTRAVENOUS | Status: DC | PRN
  Administered 2013-12-03: 5 mg via INTRAVENOUS
  Administered 2013-12-03: 25 mg via INTRAVENOUS

## 2013-12-03 MED ORDER — MIDAZOLAM HCL 2 MG/2ML IJ SOLN
INTRAMUSCULAR | Status: AC
  Filled 2013-12-03: qty 4

## 2013-12-03 MED ORDER — EPHEDRINE SULFATE 50 MG/ML IJ SOLN
INTRAMUSCULAR | Status: DC | PRN
  Administered 2013-12-03 (×2): 5 mg via INTRAVENOUS

## 2013-12-03 MED ORDER — CIPROFLOXACIN IN D5W 400 MG/200ML IV SOLN: 400.0000 mg | INTRAVENOUS | Status: DC

## 2013-12-03 MED ORDER — ROPINIROLE HCL 1 MG PO TABS
2.0000 mg | ORAL_TABLET | ORAL | Status: AC
  Administered 2013-12-03: 2 mg via ORAL
  Filled 2013-12-03: qty 2

## 2013-12-03 MED ORDER — SODIUM CHLORIDE 0.9 % IV SOLN
INTRAVENOUS | Status: DC
  Administered 2013-12-03: 08:00:00 via INTRAVENOUS

## 2013-12-03 MED ORDER — GLYCOPYRROLATE 0.2 MG/ML IJ SOLN
INTRAMUSCULAR | Status: AC
  Filled 2013-12-03: qty 2

## 2013-12-03 MED ORDER — FENTANYL CITRATE 0.05 MG/ML IJ SOLN
INTRAMUSCULAR | Status: AC
Start: 2013-12-03 — End: 2013-12-03
  Filled 2013-12-03: qty 4

## 2013-12-03 MED ORDER — ONDANSETRON HCL 4 MG/2ML IJ SOLN
4.0000 mg | Freq: Once | INTRAMUSCULAR | Status: AC
  Administered 2013-12-03: 4 mg via INTRAVENOUS
  Filled 2013-12-03: qty 2

## 2013-12-03 MED ORDER — ACETAMINOPHEN 325 MG PO TABS: 325.0000 mg | ORAL_TABLET | ORAL | Status: DC | PRN

## 2013-12-03 MED ORDER — PROPOFOL 10 MG/ML IV BOLUS
INTRAVENOUS | Status: AC
  Filled 2013-12-03: qty 20

## 2013-12-03 SURGICAL SUPPLY — 48 items
BAG URINE DRAINAGE (UROLOGICAL SUPPLIES) ×4 IMPLANT
BASKET STONE NITINOL 3FRX115MB (UROLOGICAL SUPPLIES) IMPLANT
BASKET ZERO TIP NITINOL 2.4FR (BASKET) ×4 IMPLANT
BENZOIN TINCTURE PRP APPL 2/3 (GAUZE/BANDAGES/DRESSINGS) IMPLANT
CATH FOLEY 2W COUNCIL 20FR 5CC (CATHETERS) IMPLANT
CATH INTERMIT  6FR 70CM (CATHETERS) ×4 IMPLANT
CATH ROBINSON RED A/P 20FR (CATHETERS) IMPLANT
CATH X-FORCE N30 NEPHROSTOMY (TUBING) IMPLANT
COVER MAYO STAND STRL (DRAPES) ×8 IMPLANT
COVER SURGICAL LIGHT HANDLE (MISCELLANEOUS) IMPLANT
DRAPE C-ARM 42X120 X-RAY (DRAPES) IMPLANT
DRAPE CAMERA CLOSED 9X96 (DRAPES) ×4 IMPLANT
DRAPE LINGEMAN PERC (DRAPES) IMPLANT
DRAPE SURG IRRIG POUCH 19X23 (DRAPES) IMPLANT
DRAPE UTILITY XL STRL (DRAPES) IMPLANT
DRSG PAD ABDOMINAL 8X10 ST (GAUZE/BANDAGES/DRESSINGS) IMPLANT
DRSG TEGADERM 8X12 (GAUZE/BANDAGES/DRESSINGS) IMPLANT
EXTRACTOR STONE NITINOL NGAGE (UROLOGICAL SUPPLIES) ×4 IMPLANT
FIBER LASER FLEXIVA 1000 (UROLOGICAL SUPPLIES) IMPLANT
FIBER LASER FLEXIVA 200 (UROLOGICAL SUPPLIES) ×4 IMPLANT
FIBER LASER FLEXIVA 550 (UROLOGICAL SUPPLIES) IMPLANT
GAUZE SPONGE 4X4 12PLY STRL (GAUZE/BANDAGES/DRESSINGS) IMPLANT
GLOVE BIOGEL M STRL SZ7.5 (GLOVE) ×4 IMPLANT
GOWN STRL REUS W/TWL LRG LVL3 (GOWN DISPOSABLE) ×4 IMPLANT
GUIDEWIRE STR DUAL SENSOR (WIRE) ×4 IMPLANT
KIT BASIN OR (CUSTOM PROCEDURE TRAY) IMPLANT
MANIFOLD NEPTUNE II (INSTRUMENTS) ×4 IMPLANT
NS IRRIG 1000ML POUR BTL (IV SOLUTION) ×4 IMPLANT
PACK BASIC VI WITH GOWN DISP (CUSTOM PROCEDURE TRAY) ×4 IMPLANT
PACK CYSTO (CUSTOM PROCEDURE TRAY) ×4 IMPLANT
PROBE LITHOCLAST ULTRA 3.8X403 (UROLOGICAL SUPPLIES) IMPLANT
PROBE PNEUMATIC 1.0MMX570MM (UROLOGICAL SUPPLIES) ×4 IMPLANT
SET IRRIG Y TYPE TUR BLADDER L (SET/KITS/TRAYS/PACK) IMPLANT
SET WARMING FLUID IRRIGATION (MISCELLANEOUS) IMPLANT
SHEATH ACCESS URETERAL 38CM (SHEATH) ×4 IMPLANT
SPONGE LAP 4X18 X RAY DECT (DISPOSABLE) IMPLANT
STENT ENDOURETEROTOMY 7-14 26C (STENTS) IMPLANT
STENT URET 6FRX24 CONTOUR (STENTS) ×4 IMPLANT
STONE CATCHER W/TUBE ADAPTER (UROLOGICAL SUPPLIES) ×4 IMPLANT
SUT SILK 2 0 30  PSL (SUTURE)
SUT SILK 2 0 30 PSL (SUTURE) IMPLANT
SYR 20CC LL (SYRINGE) ×8 IMPLANT
SYRINGE 10CC LL (SYRINGE) ×4 IMPLANT
TOWEL OR NON WOVEN STRL DISP B (DISPOSABLE) IMPLANT
TRAY FOLEY CATH 14FRSI W/METER (CATHETERS) IMPLANT
TUBING CONNECTING 10 (TUBING) IMPLANT
TUBING CONNECTING 10' (TUBING)
WATER STERILE IRR 1500ML POUR (IV SOLUTION) ×4 IMPLANT

## 2013-12-03 NOTE — Op Note (Signed)
Preoperative diagnosis: Right renal calculi  Postoperative diagnosis: Right renal calculi  Procedure:  1. Cystoscopy 2. Right ureteroscopy and stone removal 3. Ureteroscopic laser lithotripsy 4. Right ureteral stent placement (6 x 24 - no string) 5. Right retrograde pyelography with interpretation  Surgeon: Pryor Curia. M.D.  Anesthesia: General  Complications: None  Intraoperative findings: Right retrograde pyelography demonstrated a filling defect within the lower pole of the renal collecting system consistent with the patient's known calculi without other abnormalities noted.  EBL: Minimal  Specimens: 1. Renal calculi  Disposition of specimens: [facility] for stone analysis  Indication: [name]  is a 63 y.o. patient with urolithiasis including right renal calculi and urease producing bacteruria with recurrent symptomatic urinary tract infections. She was initially scheduled for a right percutaneous nephrostolithotomy but interventional radiology was unable to gain safe access to the lower pole percutaneously due to her stone burden obstructing the lower pole.  I therefore discussed options with the patient and she agreed to proceed with ureteroscopic laser lithotripsy as the planned first stage for treatment with plans to follow up with a PCNL after ureteroscopic laser lithotripsy and debulking of her stone to allow easier percutaneous access to the kidney.  After reviewing the management options for treatment, they elected to proceed with the above surgical procedure(s). We have discussed the potential benefits and risks of the procedure, side effects of the proposed treatment, the likelihood of the patient achieving the goals of the procedure, and any potential problems that might occur during the procedure or recuperation. Informed consent has been obtained.  Description of procedure:  The patient was taken to the operating room and general  anesthesia was induced.  The patient was placed in the dorsal lithotomy position, prepped and draped in the usual sterile fashion, and preoperative antibiotics were administered. A preoperative time-out was performed.   Cystourethroscopy was performed.  The patient's urethra was examined and was normal. The bladder was then systematically examined in its entirety. There was no evidence for any bladder tumors, stones, or other mucosal pathology.    Attention then turned to the right ureteral orifice and a ureteral catheter was used to intubate the ureteral orifice.  Omnipaque contrast was injected through the ureteral catheter and a retrograde pyelogram was performed with findings as dictated above.  A 0.38 sensor guidewire was then advanced up the right ureter into the renal pelvis under fluoroscopic guidance.  A 12/14 Fr ureteral access sheath was then advanced over the guide wire. The digital flexible ureteroscope was then advanced through the access sheath into the ureter next to the guidewire and the calculus was identified and was located in the lower pole of the renal collecting system.  There was a large amount of clot which obscured vision and I used a 0 tip nitinol basket to remove the majority of the clot allowing better visualization of the renal stones   The stone was then fragmented with the 200 micron holmium laser fiber on a setting of 1.0 J and frequency of 6 Hz.   Multiple fragments of stone were then removed with a zero tip nitinol basket.  Due to the hematuria that obscured visualization somewhat, I used a smaller 2.2 Fr N gauge basket and was able to remove a few more stones.   I then used a setting of 1.0 J and 20 Hz to dust the remaining large stones into smaller fragments.  It was felt that the stones were adequately fragmented and visualization  was becoming more difficult.  I therefore stopped the procedure with plans to return for the 2nd stage with PCNL at a later date.  The  safety wire was then replaced and the access sheath removed.  The guidewire was backloaded through the cystoscope and a ureteral stent was advance over the wire using Seldinger technique.  The stent was positioned appropriately under fluoroscopic and cystoscopic guidance.  The wire was then removed with an adequate stent curl noted in the renal pelvis as well as in the bladder.  The bladder was then emptied and the procedure ended.  The patient appeared to tolerate the procedure well and without complications.  The patient was able to be awakened and transferred to the recovery unit in satisfactory condition.   Pryor Curia MD

## 2013-12-03 NOTE — Procedures (Signed)
Procedure: Fluoro-attempt placement of Rt PCN for Nephrolithotomy.  Inability to place access catheter, despite entry into the collecting system.  Discussion with Dr. Alinda Money during the procedure regarding treatment plan.   Patient will proceed today with ureteroscopy, debulking of stones.  Will return in near future for another attempt at PCNL.  Minimal blood loss.  Patient remained HD normal.  400mg  IV Cipro peri-procedure.  Stable to observation.  Signed,  Corrie Mckusick, DO

## 2013-12-03 NOTE — Interval H&P Note (Signed)
History and Physical Interval Note:  12/03/2013 10:28 AM  [name]  has presented today for surgery, with the diagnosis of RIGHT RENAL CALCULI  The various methods of treatment have been discussed with the patient and family. After consideration of risks, benefits and other options for treatment, the patient has consented to  Procedure(s): NEPHROLITHOTOMY PERCUTANEOUS (Right) HOLMIUM LASER APPLICATION (Right) as a surgical intervention .  The patient's history has been reviewed, patient examined, no change in status, stable for surgery.  I have reviewed the patient's chart and labs.  Questions were answered to the patient's satisfaction.     Jachin Coury,LES

## 2013-12-03 NOTE — Progress Notes (Signed)
Right flank dressing CDI

## 2013-12-03 NOTE — H&P (Signed)
Agree with above note

## 2013-12-03 NOTE — Sedation Documentation (Signed)
Dr. Alinda Money at bedside to discuss options for treatment.

## 2013-12-03 NOTE — Sedation Documentation (Signed)
Patient nauseated, emesis basin given as well as Zofran

## 2013-12-03 NOTE — Interval H&P Note (Signed)
History and Physical Interval Note:  12/03/2013 12:24 PM  [name]  has presented today for surgery, with the diagnosis of RIGHT RENAL CALCULI.  She was scheduled for right percutaneous nephrostolithotomy but access was found to be difficult in Interventional Radiology and ultimately access was not able to be obtained due to the large burden of stones obstructing the lower pole calyces. I discussed options with [name] and her husband in detail.  Ultimately, we discussed proceeding with right ureteroscopic laser lithotripsy today in order to begin treatment and to debulk her stone burden.  She understands that she will require a 2nd procedure percutaneously to adequately render her stone free considering her stone burden.  However, by debulking her stone burden, access should be much easier.  I discussed the potential benefits and risks of the procedure, side effects of the proposed treatment, the likelihood of the patient achieving the goals of the procedure, and any potential problems that might occur during the procedure or recuperation. She and her husband agree to proceed with cystoscopy, right ureteroscopic laser lithotripsy and right ureteral stent placement and have given their informed consent.   [name],LES

## 2013-12-03 NOTE — Transfer of Care (Signed)
Immediate Anesthesia Transfer of Care Note  Patient: [name]  Procedure(s) Performed: Procedure(s) with comments: CYSTOSCOPY WITH RIGHT RETROGRADE URETEROSCOPY LASER LITHOTRIPSY RIGHT STONE RIGHT URETERAL STENT,  (Right) - PROCEDURE WAS ORIGINALLY SCHEDULED AS RIGHT PERC.  Patient Location: PACU  Anesthesia Type:General  Level of Consciousness: awake, oriented, patient cooperative, lethargic and responds to stimulation  Airway & Oxygen Therapy: Patient Spontanous Breathing and Patient connected to nasal cannula oxygen  Post-op Assessment: Report given to PACU RN, Post -op Vital signs reviewed and stable and Patient moving all extremities  Post vital signs: Reviewed and stable  Complications: No apparent anesthesia complications

## 2013-12-03 NOTE — Discharge Instructions (Signed)

## 2013-12-03 NOTE — Anesthesia Postprocedure Evaluation (Signed)
  Anesthesia Post-op Note  Patient: [name]  Procedure(s) Performed: Procedure(s) (LRB): CYSTOSCOPY WITH RIGHT RETROGRADE URETEROSCOPY LASER LITHOTRIPSY RIGHT STONE RIGHT URETERAL STENT,  (Right)  Patient Location: PACU  Anesthesia Type: General  Level of Consciousness: awake and alert   Airway and Oxygen Therapy: Patient Spontanous Breathing  Post-op Pain: mild  Post-op Assessment: Post-op Vital signs reviewed, Patient's Cardiovascular Status Stable, Respiratory Function Stable, Patent Airway and No signs of Nausea or vomiting  Last Vitals:  Filed Vitals:   12/03/13 1515  BP: 113/61  Pulse: 67  Temp:   Resp: 17    Post-op Vital Signs: stable   Complications: No apparent anesthesia complications

## 2013-12-03 NOTE — H&P (Signed)
Chief Complaint: Right kidney stones  Referring Physician(s): Dr. Windle Guard  History of Present Illness: [name] is a 63 y.o. female with history of large burden right renal calculi, recurrent UTI's and microscopic hematuria who presents today for right percutaneous nephrostomy prior to nephrolithotomy.  Past Medical History  Diagnosis Date  . Urticaria   . Fibromyalgia   . Tachycardia   . DM (diabetes mellitus)   . Hyperlipidemia   . Bell's palsy   . Hypertension   . Hypothyroidism   . Obesity   . UTI (urinary tract infection)   . Nephrolithiasis   . Anemia   . Carbuncle and furuncle of trunk   . Fracture MAY 2014    HX OF FRACTURED NECK C1- CAUSES SEVERE HEADACHES--LIMITED ROM NECK  . Disorder of fascia     HX OF NECROTIC FASCITIS AFTER ABDOMINAL SURGERY FOR HERNIA- REQUIRED 19 SURGERIES AND 2.5 MONTH HOSPITALIZATION AT [facility]  . Pain     LEFT SHOULDER  PAIN -HARD TO LIE ON LEFT SIDE FOR LONG PERIOD;  PAIN IN LOWER BADK - 3 HERNIATED DISCS AND STENOISIS -  . Elevated cholesterol   . Heart murmur     DOES NOT CAUSE ANY PROBLEMS  . Dysrhythmia     HX OF TACHYCARDIA AND BRADYCARDIA - ON GOING FOR YEARS - DOES NOT HAVE TO SEE CARDIOLOGIST  . Asthma     OCCAS  . Migraine   . Arthritis   . Urinary frequency   . Restless leg syndrome   . OSA (obstructive sleep apnea)     USES CPAP - DOES NOT KNOW SETTING  . Frequent infections     ESPECIALLY PRONE TO INFECTIONS AFTER SURGERIES  . PONV (postoperative nausea and vomiting)     THE GAS MAKES ME NAUSEATED    Past Surgical History  Procedure Laterality Date  . Appendectomy    . Abdominal hysterectomy    . Cholecystectomy    . Cesarean section      X 3  . Carpal tunnel release      BILATERAL  . Knee arthroscopy  RIGHT AND LEFT    X 2  . Shoulder arthroscopy Right     X 2  . Laparoscopic gastric sleeve resection  2014  . Hernia repair      ABDOMINAL HERNIA REPAIR WITH MESH - 3 SURGERIES   . Joint  replacement      TOTAL RIGHT KNEE REPLACEMENT  . Right foot drainage of infection    . I&d of infected site in belly - from an injection    . Tonsillectomy      AND ADENOIDECTOMY  . Hx of 19 surgeries for necrotic fascitis      Allergies: Codeine; Imitrex; Morphine; Nsaids; Promethazine hcl; and Sulfonamide derivatives  Medications: Prior to Admission medications   Medication Sig Start Date End Date Taking? Authorizing Provider  ciprofloxacin (CIPRO) 500 MG tablet Take 500 mg by mouth 2 (two) times daily. Pt states she is take cipro the 3 days before her surgery   Yes Historical Provider, MD  cyclobenzaprine (FLEXERIL) 10 MG tablet Take 10 mg by mouth every 6 (six) hours as needed for muscle spasms.    Yes Historical Provider, MD  fenofibrate 160 MG tablet Take 160 mg by mouth daily.   Yes Historical Provider, MD  gabapentin (NEURONTIN) 100 MG capsule Take 100 mg by mouth 3 (three) times daily.   Yes Historical Provider, MD  guaiFENesin (MUCINEX) 600 MG  12 hr tablet Take 1,200 mg by mouth 2 (two) times daily.   Yes Historical Provider, MD  insulin aspart (NOVOLOG) 100 UNIT/ML injection Inject 5 Units into the skin once as needed for high blood sugar. Per sliding scale   Yes Historical Provider, MD  insulin detemir (LEVEMIR) 100 UNIT/ML injection Inject 15 Units into the skin 2 (two) times daily.    Yes Historical Provider, MD  levothyroxine (SYNTHROID, LEVOTHROID) 75 MCG tablet Take 75 mcg by mouth See admin instructions. Mondays takes one tablet (39mcg). Rest of week takes half of a pill. Takes in the morning.   Yes Historical Provider, MD  losartan (COZAAR) 50 MG tablet Take 50 mg by mouth 2 (two) times daily.    Yes Historical Provider, MD  Magnesium 250 MG TABS Take 1 tablet by mouth 4 (four) times daily.   Yes Historical Provider, MD  metoprolol succinate (TOPROL-XL) 50 MG 24 hr tablet Take 50 mg by mouth 3 (three) times daily. Take with or immediately following a meal.   Yes Historical  Provider, MD  montelukast (SINGULAIR) 10 MG tablet Take 10 mg by mouth daily as needed (Aching/tightness in chest.).    Yes Historical Provider, MD  omeprazole (PRILOSEC) 20 MG capsule Take 20 mg by mouth every evening.   Yes Historical Provider, MD  rOPINIRole (REQUIP) 2 MG tablet Take 2 mg by mouth 4 (four) times daily.    Yes Historical Provider, MD  aspirin 81 MG tablet Take 81 mg by mouth daily.    Historical Provider, MD  BIOTIN PO Take 1 tablet by mouth daily.    Historical Provider, MD  Calcium 250 MG CAPS Take 6 tablets by mouth daily.    Historical Provider, MD  cholecalciferol (VITAMIN D) 1000 UNITS tablet Take 1,000 Units by mouth daily.    Historical Provider, MD  Coenzyme Q10 (COQ-10) 10 MG CAPS Take 1 capsule by mouth daily.    Historical Provider, MD  Cyanocobalamin (VITAMIN B12 PO) Take 1 tablet by mouth daily.    Historical Provider, MD  FLUoxetine (PROZAC) 20 MG capsule Take 20 mg by mouth at bedtime.     Historical Provider, MD  IRON PO Take 1 tablet by mouth daily.    Historical Provider, MD  LORazepam (ATIVAN) 0.5 MG tablet Take 0.5 mg by mouth 3 (three) times daily as needed for anxiety.    Historical Provider, MD  Multiple Vitamin (MULTIVITAMIN WITH MINERALS) TABS tablet Take 2 tablets by mouth daily.    Historical Provider, MD    Family History  Problem Relation Age of Onset  . Diabetes Father   . Osteoarthritis Father   . Heart disease Father   . Ulcers Father   . Stroke Mother     History   Social History  . Marital Status: Married    Spouse Name: N/A    Number of Children: 3  . Years of Education: N/A   Occupational History  . disabled    Social History Main Topics  . Smoking status: Never Smoker   . Smokeless tobacco: Never Used  . Alcohol Use: No  . Drug Use: No  . Sexual Activity: None   Other Topics Concern  . None   Social History Narrative  . None         Review of Systems   Constitutional: Negative for fever and chills.    Respiratory: Negative for shortness of breath.        Occ cough  Cardiovascular: Negative for  chest pain.  Gastrointestinal: Negative for vomiting, abdominal pain and blood in stool.       Occ nausea  Genitourinary: Positive for frequency.       Microscopic hematuria  Musculoskeletal: Positive for back pain.  Neurological: Negative for headaches.  Hematological: Does not bruise/bleed easily.    Vital Signs: BP 129/60  HR 76  R 18  TEMP 99  O2 SATS 100% RA Physical Exam  Constitutional: She is oriented to person, place, and time. She appears well-developed and well-nourished.  Cardiovascular: Normal rate and regular rhythm.   Pulmonary/Chest: Effort normal and breath sounds normal.  Abdominal: Soft. Bowel sounds are normal. There is no tenderness.  Obese  Musculoskeletal: Normal range of motion. She exhibits no edema.  Neurological: She is alert and oriented to person, place, and time.    Imaging: Dg Chest 2 View  11/27/2013   CLINICAL DATA:  History of hypertension, nonsmoker. For percutaneous nephrolithotomy.  EXAM: CHEST  2 VIEW  COMPARISON:  08/15/2012  FINDINGS: The heart size and mediastinal contours are within normal limits. Both lungs are clear. Mid thoracic degenerative changes are present. Surgical clips are noted in the upper abdomen.  IMPRESSION: No active cardiopulmonary disease.   Electronically Signed   By: Shon Hale M.D.   On: 11/27/2013 10:47    Labs: Lab Results  Component Value Date   WBC 2.5* 11/27/2013   HCT 33.8* 11/27/2013   MCV 86.4 11/27/2013   PLT 166 11/27/2013   NA 140 11/27/2013   K 4.4 11/27/2013   CL 103 11/27/2013   CO2 26 11/27/2013   GLUCOSE 117* 11/27/2013   BUN 37* 11/27/2013   CREATININE 0.84 11/27/2013   CALCIUM 10.4 11/27/2013   PROT 7.9 11/27/2013   ALBUMIN 4.0 11/27/2013   AST 23 11/27/2013   ALT 17 11/27/2013   ALKPHOS 61 11/27/2013   BILITOT 0.2* 11/27/2013   GFRNONAA 74* 11/27/2013   GFRAA 85* 11/27/2013   INR 1.14 11/27/2013    Assessment and Plan: [name] is a 63 y.o. female with history of large burden right renal calculi, recurrent UTI's and microscopic hematuria who presents today for right percutaneous nephrostomy prior to nephrolithotomy. Details/risks of procedure d/w pt/husband with their understanding and consent.  Rowe Robert, PAC

## 2013-12-04 ENCOUNTER — Encounter (HOSPITAL_COMMUNITY): Payer: Self-pay | Admitting: Urology

## 2013-12-04 ENCOUNTER — Other Ambulatory Visit (HOSPITAL_COMMUNITY): Payer: Self-pay | Admitting: Urology

## 2013-12-04 DIAGNOSIS — N200 Calculus of kidney: Secondary | ICD-10-CM

## 2013-12-05 ENCOUNTER — Encounter (HOSPITAL_COMMUNITY): Payer: Self-pay | Admitting: Pharmacy Technician

## 2013-12-05 ENCOUNTER — Other Ambulatory Visit: Payer: Self-pay | Admitting: Radiology

## 2013-12-05 ENCOUNTER — Encounter (HOSPITAL_COMMUNITY): Payer: Self-pay | Admitting: *Deleted

## 2013-12-06 ENCOUNTER — Other Ambulatory Visit: Payer: Self-pay | Admitting: Urology

## 2013-12-07 ENCOUNTER — Other Ambulatory Visit: Payer: Self-pay | Admitting: Radiology

## 2013-12-10 ENCOUNTER — Encounter (HOSPITAL_COMMUNITY)
Admission: RE | Disposition: A | Discharge status: Home / Self Care | Payer: Self-pay | Source: Ambulatory Visit | Attending: Urology

## 2013-12-10 ENCOUNTER — Encounter (HOSPITAL_COMMUNITY): Payer: Medicare Other | Admitting: Anesthesiology

## 2013-12-10 ENCOUNTER — Inpatient Hospital Stay (HOSPITAL_COMMUNITY)
Admission: RE | Admit: 2013-12-10 | Discharge: 2013-12-12 | DRG: 660 | Disposition: A | Discharge status: Home / Self Care | Payer: Medicare Other | Source: Ambulatory Visit | Attending: Urology | Admitting: Urology

## 2013-12-10 ENCOUNTER — Encounter (HOSPITAL_COMMUNITY): Payer: Self-pay

## 2013-12-10 ENCOUNTER — Ambulatory Visit (HOSPITAL_COMMUNITY): Payer: Medicare Other | Admitting: Anesthesiology

## 2013-12-10 ENCOUNTER — Ambulatory Visit (HOSPITAL_COMMUNITY)
Admission: RE | Admit: 2013-12-10 | Discharge: 2013-12-10 | Disposition: A | Discharge status: Home / Self Care | Payer: Medicare Other | Source: Ambulatory Visit | Attending: Urology | Admitting: Urology

## 2013-12-10 ENCOUNTER — Ambulatory Visit (HOSPITAL_COMMUNITY): Payer: Medicare Other

## 2013-12-10 DIAGNOSIS — IMO0001 Reserved for inherently not codable concepts without codable children: Secondary | ICD-10-CM | POA: Diagnosis present

## 2013-12-10 DIAGNOSIS — Z79899 Other long term (current) drug therapy: Secondary | ICD-10-CM

## 2013-12-10 DIAGNOSIS — G2581 Restless legs syndrome: Secondary | ICD-10-CM | POA: Diagnosis present

## 2013-12-10 DIAGNOSIS — E785 Hyperlipidemia, unspecified: Secondary | ICD-10-CM | POA: Diagnosis present

## 2013-12-10 DIAGNOSIS — I959 Hypotension, unspecified: Secondary | ICD-10-CM | POA: Diagnosis not present

## 2013-12-10 DIAGNOSIS — G4733 Obstructive sleep apnea (adult) (pediatric): Secondary | ICD-10-CM | POA: Diagnosis present

## 2013-12-10 DIAGNOSIS — M129 Arthropathy, unspecified: Secondary | ICD-10-CM | POA: Diagnosis present

## 2013-12-10 DIAGNOSIS — Z87442 Personal history of urinary calculi: Secondary | ICD-10-CM

## 2013-12-10 DIAGNOSIS — I10 Essential (primary) hypertension: Secondary | ICD-10-CM | POA: Diagnosis present

## 2013-12-10 DIAGNOSIS — Z833 Family history of diabetes mellitus: Secondary | ICD-10-CM

## 2013-12-10 DIAGNOSIS — Z823 Family history of stroke: Secondary | ICD-10-CM

## 2013-12-10 DIAGNOSIS — D62 Acute posthemorrhagic anemia: Secondary | ICD-10-CM | POA: Diagnosis not present

## 2013-12-10 DIAGNOSIS — Z87440 Personal history of urinary (tract) infections: Secondary | ICD-10-CM

## 2013-12-10 DIAGNOSIS — Z8052 Family history of malignant neoplasm of bladder: Secondary | ICD-10-CM

## 2013-12-10 DIAGNOSIS — J45909 Unspecified asthma, uncomplicated: Secondary | ICD-10-CM | POA: Diagnosis present

## 2013-12-10 DIAGNOSIS — N200 Calculus of kidney: Secondary | ICD-10-CM | POA: Diagnosis present

## 2013-12-10 DIAGNOSIS — Z794 Long term (current) use of insulin: Secondary | ICD-10-CM

## 2013-12-10 DIAGNOSIS — E119 Type 2 diabetes mellitus without complications: Secondary | ICD-10-CM | POA: Diagnosis present

## 2013-12-10 DIAGNOSIS — Z9089 Acquired absence of other organs: Secondary | ICD-10-CM

## 2013-12-10 DIAGNOSIS — Z8249 Family history of ischemic heart disease and other diseases of the circulatory system: Secondary | ICD-10-CM

## 2013-12-10 DIAGNOSIS — Z882 Allergy status to sulfonamides status: Secondary | ICD-10-CM

## 2013-12-10 DIAGNOSIS — T819XXA Unspecified complication of procedure, initial encounter: Secondary | ICD-10-CM | POA: Diagnosis present

## 2013-12-10 DIAGNOSIS — Z888 Allergy status to other drugs, medicaments and biological substances status: Secondary | ICD-10-CM

## 2013-12-10 DIAGNOSIS — E039 Hypothyroidism, unspecified: Secondary | ICD-10-CM | POA: Diagnosis present

## 2013-12-10 DIAGNOSIS — Z96659 Presence of unspecified artificial knee joint: Secondary | ICD-10-CM

## 2013-12-10 DIAGNOSIS — E7800 Pure hypercholesterolemia, unspecified: Secondary | ICD-10-CM | POA: Diagnosis present

## 2013-12-10 DIAGNOSIS — Z885 Allergy status to narcotic agent status: Secondary | ICD-10-CM

## 2013-12-10 HISTORY — PX: NEPHROLITHOTOMY: SHX5134

## 2013-12-10 LAB — CBC WITH DIFFERENTIAL/PLATELET
Basophils Absolute: 0 10*3/uL (ref 0.0–0.1)
Basophils Relative: 0 % (ref 0–1)
Eosinophils Absolute: 0 10*3/uL (ref 0.0–0.7)
Eosinophils Relative: 0 % (ref 0–5)
HCT: 30.1 % — ABNORMAL LOW (ref 36.0–46.0)
Hemoglobin: 9.9 g/dL — ABNORMAL LOW (ref 12.0–15.0)
Lymphocytes Relative: 21 % (ref 12–46)
Lymphs Abs: 1.2 10*3/uL (ref 0.7–4.0)
MCH: 28.9 pg (ref 26.0–34.0)
MCHC: 32.9 g/dL (ref 30.0–36.0)
MCV: 87.8 fL (ref 78.0–100.0)
Monocytes Absolute: 0.5 10*3/uL (ref 0.1–1.0)
Monocytes Relative: 9 % (ref 3–12)
Neutro Abs: 4 10*3/uL (ref 1.7–7.7)
Neutrophils Relative %: 70 % (ref 43–77)
Platelets: 194 10*3/uL (ref 150–400)
RBC: 3.43 MIL/uL — ABNORMAL LOW (ref 3.87–5.11)
RDW: 14 % (ref 11.5–15.5)
WBC: 5.6 10*3/uL (ref 4.0–10.5)

## 2013-12-10 LAB — GLUCOSE, CAPILLARY
Glucose-Capillary: 145 mg/dL — ABNORMAL HIGH (ref 70–99)
Glucose-Capillary: 84 mg/dL (ref 70–99)
Glucose-Capillary: 170 mg/dL — ABNORMAL HIGH (ref 70–99)
Glucose-Capillary: 143 mg/dL — ABNORMAL HIGH (ref 70–99)

## 2013-12-10 LAB — PROTIME-INR
INR: 1.17 (ref 0.00–1.49)
Prothrombin Time: 14.9 seconds (ref 11.6–15.2)

## 2013-12-10 LAB — BASIC METABOLIC PANEL
Anion gap: 13 (ref 5–15)
Anion gap: 10 (ref 5–15)
BUN: 50 mg/dL — ABNORMAL HIGH (ref 6–23)
BUN: 43 mg/dL — ABNORMAL HIGH (ref 6–23)
CO2: 27 mEq/L (ref 19–32)
CO2: 26 mEq/L (ref 19–32)
Calcium: 9.9 mg/dL (ref 8.4–10.5)
Calcium: 9.1 mg/dL (ref 8.4–10.5)
Chloride: 98 mEq/L (ref 96–112)
Chloride: 99 mEq/L (ref 96–112)
Creatinine, Ser: 1.36 mg/dL — ABNORMAL HIGH (ref 0.50–1.10)
Creatinine, Ser: 1.29 mg/dL — ABNORMAL HIGH (ref 0.50–1.10)
GFR calc Af Amer: 48 mL/min — ABNORMAL LOW (ref >90)
GFR calc Af Amer: 51 mL/min — ABNORMAL LOW (ref >90)
GFR calc non Af Amer: 41 mL/min — ABNORMAL LOW (ref >90)
GFR calc non Af Amer: 44 mL/min — ABNORMAL LOW (ref >90)
Glucose, Bld: 168 mg/dL — ABNORMAL HIGH (ref 70–99)
Glucose, Bld: 220 mg/dL — ABNORMAL HIGH (ref 70–99)
Potassium: 4.5 mEq/L (ref 3.7–5.3)
Potassium: 4.3 mEq/L (ref 3.7–5.3)
Sodium: 138 mEq/L (ref 137–147)
Sodium: 135 mEq/L — ABNORMAL LOW (ref 137–147)

## 2013-12-10 LAB — HEMOGLOBIN AND HEMATOCRIT, BLOOD
HCT: 24.9 % — ABNORMAL LOW (ref 36.0–46.0)
Hemoglobin: 8.3 g/dL — ABNORMAL LOW (ref 12.0–15.0)

## 2013-12-10 SURGERY — NEPHROLITHOTOMY PERCUTANEOUS
Anesthesia: General | Site: Back | Laterality: Right

## 2013-12-10 MED ORDER — EPHEDRINE SULFATE 50 MG/ML IJ SOLN
10.0000 mg | Freq: Once | INTRAMUSCULAR | Status: AC
  Administered 2013-12-10: 10 mg via INTRAVENOUS

## 2013-12-10 MED ORDER — OXYCODONE-ACETAMINOPHEN 5-325 MG PO TABS: 1.0000 | ORAL_TABLET | ORAL | Status: DC | PRN

## 2013-12-10 MED ORDER — MIDAZOLAM HCL 5 MG/5ML IJ SOLN
INTRAMUSCULAR | Status: DC | PRN
  Administered 2013-12-10: 2 mg via INTRAVENOUS

## 2013-12-10 MED ORDER — PHENYLEPHRINE 40 MCG/ML (10ML) SYRINGE FOR IV PUSH (FOR BLOOD PRESSURE SUPPORT)
80.0000 ug | PREFILLED_SYRINGE | Freq: Once | INTRAVENOUS | Status: DC
Start: 2013-12-10 — End: 2013-12-10
  Filled 2013-12-10: qty 2

## 2013-12-10 MED ORDER — EPHEDRINE SULFATE 50 MG/ML IJ SOLN
INTRAMUSCULAR | Status: DC | PRN
  Administered 2013-12-10: 10 mg via INTRAVENOUS

## 2013-12-10 MED ORDER — 0.9 % SODIUM CHLORIDE (POUR BTL) OPTIME
TOPICAL | Status: DC | PRN
  Administered 2013-12-10: 1000 mL

## 2013-12-10 MED ORDER — HYDROMORPHONE HCL 1 MG/ML IJ SOLN
0.5000 mg | INTRAMUSCULAR | Status: DC | PRN
  Administered 2013-12-10: 1 mg via INTRAVENOUS
  Filled 2013-12-10: qty 1

## 2013-12-10 MED ORDER — CIPROFLOXACIN IN D5W 400 MG/200ML IV SOLN
INTRAVENOUS | Status: AC
  Administered 2013-12-10: 400 mg
  Filled 2013-12-10: qty 200

## 2013-12-10 MED ORDER — GUAIFENESIN ER 600 MG PO TB12
1200.0000 mg | ORAL_TABLET | Freq: Two times a day (BID) | ORAL | Status: DC
  Administered 2013-12-10 – 2013-12-12 (×4): 1200 mg via ORAL
  Filled 2013-12-10 (×4): qty 2

## 2013-12-10 MED ORDER — LACTATED RINGERS IV SOLN: INTRAVENOUS | Status: DC

## 2013-12-10 MED ORDER — LOSARTAN POTASSIUM 50 MG PO TABS
50.0000 mg | ORAL_TABLET | Freq: Two times a day (BID) | ORAL | Status: DC
  Administered 2013-12-10: 50 mg via ORAL
  Filled 2013-12-10 (×3): qty 1

## 2013-12-10 MED ORDER — ALBUMIN HUMAN 5 % IV SOLN
12.5000 g | Freq: Once | INTRAVENOUS | Status: AC
  Administered 2013-12-10: 12.5 g via INTRAVENOUS

## 2013-12-10 MED ORDER — CIPROFLOXACIN IN D5W 400 MG/200ML IV SOLN
400.0000 mg | Freq: Two times a day (BID) | INTRAVENOUS | Status: AC
  Administered 2013-12-10: 400 mg via INTRAVENOUS
  Filled 2013-12-10: qty 200

## 2013-12-10 MED ORDER — PHENYLEPHRINE HCL 10 MG/ML IJ SOLN
10.0000 mg | INTRAVENOUS | Status: DC | PRN
  Administered 2013-12-10: 50 ug/min via INTRAVENOUS

## 2013-12-10 MED ORDER — BELLADONNA ALKALOIDS-OPIUM 16.2-60 MG RE SUPP: 1.0000 | Freq: Four times a day (QID) | RECTAL | Status: DC | PRN

## 2013-12-10 MED ORDER — SUCCINYLCHOLINE CHLORIDE 20 MG/ML IJ SOLN
INTRAMUSCULAR | Status: DC | PRN
  Administered 2013-12-10: 100 mg via INTRAVENOUS

## 2013-12-10 MED ORDER — SODIUM CHLORIDE 0.9 % IR SOLN
Status: DC | PRN
  Administered 2013-12-10: 6000 mL

## 2013-12-10 MED ORDER — EPHEDRINE SULFATE 50 MG/ML IJ SOLN: 10.0000 mg | Freq: Once | INTRAMUSCULAR | Status: DC

## 2013-12-10 MED ORDER — DEXTROSE IN LACTATED RINGERS 5 % IV SOLN
INTRAVENOUS | Status: DC
  Administered 2013-12-10: 1000 mL via INTRAVENOUS

## 2013-12-10 MED ORDER — ROPINIROLE HCL 1 MG PO TABS
2.0000 mg | ORAL_TABLET | Freq: Four times a day (QID) | ORAL | Status: DC
  Administered 2013-12-11 – 2013-12-12 (×5): 2 mg via ORAL
  Filled 2013-12-10 (×8): qty 2

## 2013-12-10 MED ORDER — DIPHENHYDRAMINE HCL 12.5 MG/5ML PO ELIX
12.5000 mg | ORAL_SOLUTION | Freq: Four times a day (QID) | ORAL | Status: DC | PRN
  Administered 2013-12-12: 12.5 mg via ORAL
  Filled 2013-12-10: qty 5

## 2013-12-10 MED ORDER — PROPOFOL 10 MG/ML IV BOLUS
INTRAVENOUS | Status: AC
  Filled 2013-12-10: qty 20

## 2013-12-10 MED ORDER — DOCUSATE SODIUM 100 MG PO CAPS
100.0000 mg | ORAL_CAPSULE | Freq: Two times a day (BID) | ORAL | Status: DC
  Administered 2013-12-10 – 2013-12-12 (×4): 100 mg via ORAL
  Filled 2013-12-10 (×4): qty 1

## 2013-12-10 MED ORDER — IOHEXOL 300 MG/ML  SOLN
25.0000 mL | Freq: Once | INTRAMUSCULAR | Status: AC | PRN
Start: 2013-12-10 — End: 2013-12-10
  Administered 2013-12-10: 25 mL

## 2013-12-10 MED ORDER — FENTANYL CITRATE 0.05 MG/ML IJ SOLN
INTRAMUSCULAR | Status: AC | PRN
  Administered 2013-12-10: 50 ug via INTRAVENOUS
  Administered 2013-12-10: 100 ug via INTRAVENOUS

## 2013-12-10 MED ORDER — CISATRACURIUM BESYLATE (PF) 10 MG/5ML IV SOLN
INTRAVENOUS | Status: DC | PRN
  Administered 2013-12-10: 5 mg via INTRAVENOUS

## 2013-12-10 MED ORDER — ACETAMINOPHEN 325 MG PO TABS
650.0000 mg | ORAL_TABLET | ORAL | Status: DC | PRN
  Administered 2013-12-10 – 2013-12-11 (×2): 650 mg via ORAL
  Filled 2013-12-10 (×2): qty 2

## 2013-12-10 MED ORDER — INFLUENZA VAC SPLIT QUAD 0.5 ML IM SUSY
0.5000 mL | PREFILLED_SYRINGE | INTRAMUSCULAR | Status: DC
  Filled 2013-12-10 (×2): qty 0.5

## 2013-12-10 MED ORDER — EPHEDRINE SULFATE 50 MG/ML IJ SOLN
80.0000 mg | Freq: Once | INTRAMUSCULAR | Status: AC
  Administered 2013-12-10: 80 mg via INTRAVENOUS

## 2013-12-10 MED ORDER — PHENYLEPHRINE 40 MCG/ML (10ML) SYRINGE FOR IV PUSH (FOR BLOOD PRESSURE SUPPORT)
PREFILLED_SYRINGE | INTRAVENOUS | Status: AC
  Filled 2013-12-10: qty 10

## 2013-12-10 MED ORDER — MIDAZOLAM HCL 2 MG/2ML IJ SOLN
INTRAMUSCULAR | Status: AC
  Filled 2013-12-10: qty 2

## 2013-12-10 MED ORDER — APREPITANT 80 & 125 MG PO MISC
125.0000 mg | Freq: Once | ORAL | Status: AC
  Administered 2013-12-10: 125 mg via ORAL
  Filled 2013-12-10: qty 3

## 2013-12-10 MED ORDER — FENTANYL CITRATE 0.05 MG/ML IJ SOLN
INTRAMUSCULAR | Status: DC | PRN
  Administered 2013-12-10: 100 ug via INTRAVENOUS

## 2013-12-10 MED ORDER — METOCLOPRAMIDE HCL 5 MG/ML IJ SOLN: 5.0000 mg | Freq: Four times a day (QID) | INTRAMUSCULAR | Status: DC | PRN

## 2013-12-10 MED ORDER — IOHEXOL 300 MG/ML  SOLN
INTRAMUSCULAR | Status: DC | PRN
  Administered 2013-12-10: 20 mL

## 2013-12-10 MED ORDER — POTASSIUM CHLORIDE IN NACL 20-0.45 MEQ/L-% IV SOLN
INTRAVENOUS | Status: DC
  Administered 2013-12-10 – 2013-12-11 (×3): via INTRAVENOUS
  Filled 2013-12-10 (×5): qty 1000

## 2013-12-10 MED ORDER — PHENYLEPHRINE HCL 10 MG/ML IJ SOLN
INTRAMUSCULAR | Status: AC
  Filled 2013-12-10: qty 1

## 2013-12-10 MED ORDER — ONDANSETRON HCL 4 MG/2ML IJ SOLN
INTRAMUSCULAR | Status: DC | PRN
  Administered 2013-12-10: 4 mg via INTRAVENOUS

## 2013-12-10 MED ORDER — LIDOCAINE HCL 1 % IJ SOLN
INTRAMUSCULAR | Status: AC
  Filled 2013-12-10: qty 20

## 2013-12-10 MED ORDER — PROPOFOL 10 MG/ML IV BOLUS
INTRAVENOUS | Status: DC | PRN
  Administered 2013-12-10: 150 mg via INTRAVENOUS

## 2013-12-10 MED ORDER — PHENYLEPHRINE HCL 10 MG/ML IJ SOLN
INTRAMUSCULAR | Status: DC | PRN
  Administered 2013-12-10: 120 ug via INTRAVENOUS
  Administered 2013-12-10: 160 ug via INTRAVENOUS
  Administered 2013-12-10: 120 ug via INTRAVENOUS

## 2013-12-10 MED ORDER — METOPROLOL SUCCINATE ER 50 MG PO TB24: 50.0000 mg | ORAL_TABLET | Freq: Three times a day (TID) | ORAL | Status: DC

## 2013-12-10 MED ORDER — FENTANYL CITRATE 0.05 MG/ML IJ SOLN
INTRAMUSCULAR | Status: AC
  Filled 2013-12-10: qty 5

## 2013-12-10 MED ORDER — LEVOTHYROXINE SODIUM 75 MCG PO TABS
75.0000 ug | ORAL_TABLET | Freq: Every day | ORAL | Status: DC
  Administered 2013-12-11 – 2013-12-12 (×2): 75 ug via ORAL
  Filled 2013-12-10 (×3): qty 1

## 2013-12-10 MED ORDER — MIDAZOLAM HCL 2 MG/2ML IJ SOLN
INTRAMUSCULAR | Status: AC | PRN
  Administered 2013-12-10 (×2): 1 mg via INTRAVENOUS
  Administered 2013-12-10: 2 mg via INTRAVENOUS

## 2013-12-10 MED ORDER — MIDAZOLAM HCL 2 MG/2ML IJ SOLN
INTRAMUSCULAR | Status: AC
  Filled 2013-12-10: qty 6

## 2013-12-10 MED ORDER — PHENYLEPHRINE HCL 10 MG/ML IJ SOLN
80.0000 ug | Freq: Once | INTRAMUSCULAR | Status: DC
Start: 2013-12-10 — End: 2013-12-10

## 2013-12-10 MED ORDER — ONDANSETRON HCL 4 MG/2ML IJ SOLN
4.0000 mg | INTRAMUSCULAR | Status: DC | PRN
  Administered 2013-12-10 – 2013-12-11 (×3): 4 mg via INTRAVENOUS
  Filled 2013-12-10 (×3): qty 2

## 2013-12-10 MED ORDER — LACTATED RINGERS IV SOLN
INTRAVENOUS | Status: DC
  Administered 2013-12-10: 17:00:00 via INTRAVENOUS
  Administered 2013-12-10: 1000 mL via INTRAVENOUS

## 2013-12-10 MED ORDER — METOCLOPRAMIDE HCL 5 MG/ML IJ SOLN: 10.0000 mg | Freq: Once | INTRAMUSCULAR | Status: DC | PRN

## 2013-12-10 MED ORDER — FLUOXETINE HCL 20 MG PO CAPS
20.0000 mg | ORAL_CAPSULE | Freq: Every day | ORAL | Status: DC
  Administered 2013-12-10 – 2013-12-11 (×2): 20 mg via ORAL
  Filled 2013-12-10 (×2): qty 1

## 2013-12-10 MED ORDER — FENTANYL CITRATE 0.05 MG/ML IJ SOLN
INTRAMUSCULAR | Status: AC
  Filled 2013-12-10: qty 6

## 2013-12-10 MED ORDER — ONDANSETRON HCL 4 MG PO TABS: 4.0000 mg | ORAL_TABLET | Freq: Three times a day (TID) | ORAL | Status: DC | PRN

## 2013-12-10 MED ORDER — LACTATED RINGERS IV BOLUS (SEPSIS)
1000.0000 mL | Freq: Once | INTRAVENOUS | Status: AC
  Administered 2013-12-10: 1000 mL via INTRAVENOUS

## 2013-12-10 MED ORDER — PANTOPRAZOLE SODIUM 40 MG PO TBEC
40.0000 mg | DELAYED_RELEASE_TABLET | Freq: Every day | ORAL | Status: DC
  Administered 2013-12-11 – 2013-12-12 (×2): 40 mg via ORAL
  Filled 2013-12-10 (×2): qty 1

## 2013-12-10 MED ORDER — METOCLOPRAMIDE HCL 5 MG/ML IJ SOLN
INTRAMUSCULAR | Status: DC | PRN
  Administered 2013-12-10: 10 mg via INTRAVENOUS

## 2013-12-10 MED ORDER — SODIUM CHLORIDE 0.9 % IV BOLUS (SEPSIS)
500.0000 mL | Freq: Once | INTRAVENOUS | Status: AC
  Administered 2013-12-10: 500 mL via INTRAVENOUS

## 2013-12-10 MED ORDER — FENTANYL CITRATE 0.05 MG/ML IJ SOLN
25.0000 ug | INTRAMUSCULAR | Status: DC | PRN
  Administered 2013-12-10: 50 ug via INTRAVENOUS
  Administered 2013-12-10: 25 ug via INTRAVENOUS

## 2013-12-10 MED ORDER — LORAZEPAM 0.5 MG PO TABS: 0.5000 mg | ORAL_TABLET | Freq: Three times a day (TID) | ORAL | Status: DC | PRN

## 2013-12-10 MED ORDER — DIPHENHYDRAMINE HCL 50 MG/ML IJ SOLN: 12.5000 mg | Freq: Four times a day (QID) | INTRAMUSCULAR | Status: DC | PRN

## 2013-12-10 MED ORDER — METOPROLOL TARTRATE 25 MG PO TABS
50.0000 mg | ORAL_TABLET | Freq: Three times a day (TID) | ORAL | Status: DC
  Filled 2013-12-10: qty 1

## 2013-12-10 MED ORDER — MONTELUKAST SODIUM 10 MG PO TABS
10.0000 mg | ORAL_TABLET | Freq: Every day | ORAL | Status: DC | PRN
  Filled 2013-12-10: qty 1

## 2013-12-10 MED ORDER — INSULIN ASPART 100 UNIT/ML ~~LOC~~ SOLN
0.0000 [IU] | SUBCUTANEOUS | Status: DC
Start: 2013-12-10 — End: 2013-12-12
  Administered 2013-12-10: 4 [IU] via SUBCUTANEOUS
  Administered 2013-12-10: 3 [IU] via SUBCUTANEOUS
  Administered 2013-12-11: 4 [IU] via SUBCUTANEOUS
  Administered 2013-12-11: 3 [IU] via SUBCUTANEOUS

## 2013-12-10 MED ORDER — OXYCODONE-ACETAMINOPHEN 5-325 MG PO TABS
1.0000 | ORAL_TABLET | ORAL | Status: DC | PRN
  Administered 2013-12-11 (×2): 1 via ORAL
  Filled 2013-12-10 (×2): qty 1

## 2013-12-10 MED ORDER — CIPROFLOXACIN IN D5W 400 MG/200ML IV SOLN: 400.0000 mg | INTRAVENOUS | Status: DC

## 2013-12-10 MED ORDER — ZOLPIDEM TARTRATE 5 MG PO TABS: 5.0000 mg | ORAL_TABLET | Freq: Every evening | ORAL | Status: DC | PRN

## 2013-12-10 MED ORDER — SODIUM CHLORIDE 0.9 % IV SOLN
INTRAVENOUS | Status: DC
  Administered 2013-12-10: 09:00:00 via INTRAVENOUS

## 2013-12-10 MED ORDER — FENOFIBRATE 160 MG PO TABS
160.0000 mg | ORAL_TABLET | Freq: Every evening | ORAL | Status: DC
  Administered 2013-12-11: 160 mg via ORAL
  Filled 2013-12-10 (×2): qty 1

## 2013-12-10 MED ORDER — GABAPENTIN 100 MG PO CAPS
100.0000 mg | ORAL_CAPSULE | Freq: Three times a day (TID) | ORAL | Status: DC
  Administered 2013-12-10 – 2013-12-12 (×5): 100 mg via ORAL
  Filled 2013-12-10 (×6): qty 1

## 2013-12-10 MED ORDER — ALBUMIN HUMAN 5 % IV SOLN
INTRAVENOUS | Status: AC
  Filled 2013-12-10: qty 250

## 2013-12-10 MED ORDER — FENTANYL CITRATE 0.05 MG/ML IJ SOLN
INTRAMUSCULAR | Status: AC
  Filled 2013-12-10: qty 2

## 2013-12-10 MED ORDER — MEPERIDINE HCL 50 MG/ML IJ SOLN: 6.2500 mg | INTRAMUSCULAR | Status: DC | PRN

## 2013-12-10 SURGICAL SUPPLY — 46 items
BAG URINE DRAINAGE (UROLOGICAL SUPPLIES) ×8 IMPLANT
BASKET STONE NITINOL 3FRX115MB (UROLOGICAL SUPPLIES) IMPLANT
BENZOIN TINCTURE PRP APPL 2/3 (GAUZE/BANDAGES/DRESSINGS) ×8 IMPLANT
CATH FOLEY 2W COUNCIL 20FR 5CC (CATHETERS) ×4 IMPLANT
CATH ROBINSON RED A/P 20FR (CATHETERS) IMPLANT
CATH X-FORCE N30 NEPHROSTOMY (TUBING) ×4 IMPLANT
COVER SURGICAL LIGHT HANDLE (MISCELLANEOUS) ×4 IMPLANT
DRAPE C-ARM 42X120 X-RAY (DRAPES) ×4 IMPLANT
DRAPE CAMERA CLOSED 9X96 (DRAPES) ×4 IMPLANT
DRAPE LINGEMAN PERC (DRAPES) ×4 IMPLANT
DRAPE SURG IRRIG POUCH 19X23 (DRAPES) ×4 IMPLANT
DRAPE UTILITY XL STRL (DRAPES) ×4 IMPLANT
DRSG PAD ABDOMINAL 8X10 ST (GAUZE/BANDAGES/DRESSINGS) ×8 IMPLANT
DRSG TEGADERM 8X12 (GAUZE/BANDAGES/DRESSINGS) ×8 IMPLANT
FIBER LASER FLEXIVA 1000 (UROLOGICAL SUPPLIES) IMPLANT
FIBER LASER FLEXIVA 200 (UROLOGICAL SUPPLIES) IMPLANT
FIBER LASER FLEXIVA 365 (UROLOGICAL SUPPLIES) IMPLANT
FIBER LASER FLEXIVA 550 (UROLOGICAL SUPPLIES) ×4 IMPLANT
FIBER LASER TRAC TIP (UROLOGICAL SUPPLIES) IMPLANT
GAUZE SPONGE 4X4 12PLY STRL (GAUZE/BANDAGES/DRESSINGS) ×4 IMPLANT
GLOVE BIOGEL M STRL SZ7.5 (GLOVE) ×4 IMPLANT
GOWN STRL REUS W/TWL LRG LVL3 (GOWN DISPOSABLE) ×4 IMPLANT
KIT BASIN OR (CUSTOM PROCEDURE TRAY) ×4 IMPLANT
MANIFOLD NEPTUNE II (INSTRUMENTS) ×4 IMPLANT
NS IRRIG 1000ML POUR BTL (IV SOLUTION) ×4 IMPLANT
PACK BASIC VI WITH GOWN DISP (CUSTOM PROCEDURE TRAY) ×4 IMPLANT
PAD ABD 8X10 STRL (GAUZE/BANDAGES/DRESSINGS) ×4 IMPLANT
PROBE LITHOCLAST ULTRA 3.8X403 (UROLOGICAL SUPPLIES) ×4 IMPLANT
PROBE PNEUMATIC 1.0MMX570MM (UROLOGICAL SUPPLIES) ×4 IMPLANT
SCOTCHCAST PLUS 4X4 WHITE (CAST SUPPLIES) ×4 IMPLANT
SET IRRIG Y TYPE TUR BLADDER L (SET/KITS/TRAYS/PACK) ×4 IMPLANT
SET WARMING FLUID IRRIGATION (MISCELLANEOUS) ×4 IMPLANT
SOL PREP POV-IOD 4OZ 10% (MISCELLANEOUS) ×4 IMPLANT
SPONGE LAP 4X18 X RAY DECT (DISPOSABLE) ×4 IMPLANT
STENT ENDOURETEROTOMY 7-14 26C (STENTS) IMPLANT
STONE CATCHER W/TUBE ADAPTER (UROLOGICAL SUPPLIES) ×4 IMPLANT
SUT SILK 2 0 30  PSL (SUTURE) ×4
SUT SILK 2 0 30 PSL (SUTURE) ×4 IMPLANT
SYR 20CC LL (SYRINGE) ×8 IMPLANT
SYR BULB IRRIGATION 50ML (SYRINGE) ×4 IMPLANT
SYRINGE 10CC LL (SYRINGE) ×4 IMPLANT
TOWEL OR NON WOVEN STRL DISP B (DISPOSABLE) ×4 IMPLANT
TRAY FOLEY CATH 14FRSI W/METER (CATHETERS) ×4 IMPLANT
TUBING CONNECTING 10 (TUBING) ×9 IMPLANT
TUBING CONNECTING 10' (TUBING) ×3
WATER STERILE IRR 1500ML POUR (IV SOLUTION) ×4 IMPLANT

## 2013-12-10 NOTE — Anesthesia Postprocedure Evaluation (Signed)
  Anesthesia Post-op Note  Patient: [name]  Procedure(s) Performed: Procedure(s) (LRB): NEPHROLITHOTOMY PERCUTANEOUS (Right)  Patient Location: PACU  Anesthesia Type: General  Level of Consciousness: awake and alert   Airway and Oxygen Therapy: Patient Spontanous Breathing  Post-op Pain: mild  Post-op Assessment: Post-op Vital signs reviewed, Patient's Cardiovascular Status Stable, Respiratory Function Stable, Patent Airway and No signs of Nausea or vomiting  Last Vitals:  Filed Vitals:   12/10/13 1930  BP: 107/41  Pulse: 72  Temp: 37.1 C  Resp: 17    Post-op Vital Signs: stable   Complications: No apparent anesthesia complications  Some post-op hypotension. Responded to pressors and fluid+albumin

## 2013-12-10 NOTE — H&P (View-Only) (Signed)
History of Present Illness [name] is a 63 year old with the following urologic history:    1) Recurrent UTIs: She has a long history of infrequent episodes of simple cystitis but developed frequent recurring UTIs in 2010 when she reportedly had six infections. I had a culture from only one infection episode which was positive for ciprofloxacin sensitive Klebsiella. On her initial evaluation with me in January 2011, she was noted to have a mildly elevated PVR of 130 cc. She began nightly prophylaxis with nitrofurantoin at that time but had since discontinued this therapy. She was also found to have a non-obstructing right renal stone in November 2011 rasing the possibility of struvite stone formation.  Current treatment: Trimethoprim nightly    2) Microscopic hematuria: She has had multiple infections but did have one UA with microscopic hematuria when she was not infected or symptomatic. This has not been persistent. She denies gross hematuria and has no history of tobacco use. Her mother was a smoker and did have bladder cancer.    3) Urolithiasis: She has a history of a known 5-6 mm right renal calculus.    Interval history:    She follows up today for further evaluation of her right renal calculi and recurrent UTIs as well as concern for a possible ureteral calculus. She has had intermittent recurrent UTIs but more recently presented with a UTI in June when she had cloudy urine so she was some mild dysuria. This was apparently positive for Klebsiella. She was then seen by Anders Grant, [facility], in our office for follow-up and had an E. coli/enterococcus UTI. She has never been terribly symptomatic and apparently has had positive urine cultures over the past few years even when asymptomatic. Jeani Hawking had thought she might have a ureteral calculus on her KUB x-ray in June. She had a follow-up KUB x-ray about 3 weeks ago. I reviewed both of these films today. She does appear to have 2 large stones  in the right kidney measuring 1.5 cm and 1 cm respectively. I do not see any calcifications along the expected course of the ureters bilaterally. She is currently without flank pain or hematuria. She denies any fever.   Past Medical History Problems  1. History of Arthritis (V13.4) 2. History of asthma (V12.69) 3. History of diabetes mellitus (V12.29) 4. History of hypercholesterolemia (V12.29) 5. History of hypothyroidism (V12.29) 6. History of sleep apnea (V13.89) 7. History of Restless leg syndrome (333.94)  Surgical History Problems  1. History of Appendectomy 2. History of Arthroscopy Knee 3. History of Arthroscopy Shoulder 4. History of Cholecystectomy 5. History of Gastric Surgery For Morbid Obesity Laparoscopic Longitudinal Gastrectomy 6. History of Hysterectomy 7. History of Incisional Hernia Repair 8. History of Knee Replacement 9. History of Neuroplasty Decompression Median Nerve At Carpal Tunnel  Current Meds 1. Baby Aspirin 81 MG CHEW;  Therapy: (Recorded:30Jun2015) to Recorded 2. Biotin TABS;  Therapy: (Recorded:30Jun2015) to Recorded 3. Calcium TABS;  Therapy: (Recorded:30Jun2015) to Recorded 4. ClonazePAM 0.5 MG Oral Tablet;  Therapy: (Recorded:30Jun2015) to Recorded 5. Co Q10 CAPS;  Therapy: (Recorded:30Jun2015) to Recorded 6. Cyclobenzaprine HCl - 10 MG Oral Tablet;  Therapy: (Recorded:30Jun2015) to Recorded 7. Fenofibrate 160 MG Oral Tablet;  Therapy: [id number] to Recorded 8. Flonase 50 MCG/ACT SUSP;  Therapy: (Recorded:30Jun2015) to Recorded 9. FLUoxetine HCl - 20 MG Oral Capsule;  Therapy: [id number] to Recorded 10. Folic Acid CAPS;   Therapy: (Recorded:30Jun2015) to Recorded 11. Gabapentin 100 MG Oral Capsule;   Therapy: [id number] to Recorded 12.  HydrOXYzine HCl TABS;   Therapy: (Recorded:30Jun2015) to Recorded 13. Iron TABS;   Therapy: (Recorded:30Jun2015) to Recorded 14. Levemir 100 UNIT/ML Subcutaneous Solution;   Therapy:  (Recorded:28Aug2012) to Recorded 15. Losartan Potassium 50 MG Oral Tablet;   Therapy: [id number] to Recorded 16. Magnesium CAPS;   Therapy: (Recorded:30Jun2015) to Recorded 17. Naproxen TABS;   Therapy: ([id number]) to Recorded 18. NovoLOG 100 UNIT/ML Subcutaneous Solution;   Therapy: (Recorded:28Aug2012) to Recorded 19. Ondansetron HCl TABS;   Therapy: (Recorded:30Jun2015) to Recorded 20. Oxycodone-Acetaminophen 5-325 MG Oral Tablet;   Therapy: [id number] to Recorded 21. Patanol 0.1 % Ophthalmic Solution;   Therapy: (Recorded:30Jun2015) to Recorded 22. [place];   Therapy: ([id number]:[id number]) to Recorded 23. Probiotic CAPS;   Therapy: (Recorded:30Jun2015) to Recorded 24. ROPINIRole HCl - 0.5 MG Oral Tablet;   Therapy: [id number] to Recorded 25. Singulair 10 MG Oral Tablet;   Therapy: (Recorded:30Jun2015) to Recorded 26. Synthroid 75 MCG Oral Tablet;   Therapy: (Recorded:30Jun2015) to Recorded 27. Toprol XL 50 MG TBCR;   Therapy: (Recorded:30Jun2015) to Recorded 28. Vitamin B12 TABS;   Therapy: (Recorded:30Jun2015) to Recorded 29. Vitamin C TABS;   Therapy: (Recorded:30Jun2015) to Recorded 30. Zyrtec 10 MG TABS;   Therapy: (Recorded:30Jun2015) to Recorded  Allergies Medication  1. Codeine Derivatives 2. Morphine Derivatives 3. Phenergan TABS 4. Sulfa Drugs 5. Imitrex TABS 6. Statins  Family History Problems  1. Family history of Bladder Cancer (X83.38) : Mother 2. Family history of Diabetes Mellitus (V18.0) : Father 3. Family history of Heart Disease (V17.49) : Father 4. Family history of Renal Failure : Father  Social History Problems  1. Denied: History of Alcohol Use 2. Marital History - Currently Married 3. Never A Smoker 4. Occupation:   Disabled 2. Denied: History of Tobacco Use  Review of Systems  Genitourinary: no hematuria.  Gastrointestinal: no nausea and no vomiting.  Constitutional: no fever.  Cardiovascular: no chest pain and no  leg swelling.  Respiratory: no shortness of breath.    Vitals Vital Signs [Data Includes: Last 1 Day]  Recorded: 14Aug2015 09:27AM  Weight: 184 lb  BMI Calculated: 34.77 BSA Calculated: 1.82 Blood Pressure: 92 / 52 Heart Rate: 60  Physical Exam Constitutional: Well nourished and well developed . No acute distress.  ENT:. The ears and nose are normal in appearance.  Neck: The appearance of the neck is normal and no neck mass is present.  Pulmonary: No respiratory distress, normal respiratory rhythm and effort and clear bilateral breath sounds.  Cardiovascular: Heart rate and rhythm are normal . No peripheral edema.  Abdomen: The abdomen is soft and nontender. No masses are palpated. No CVA tenderness. No hernias are palpable. No hepatosplenomegaly noted. She has many well-healed abdominal incisions including a large ventral hernia repair.  Lymphatics: The femoral and inguinal nodes are not enlarged or tender.  Skin: Normal skin turgor, no visible rash and no visible skin lesions.  Neuro/Psych:. Mood and affect are appropriate.    Results/Data Urine [Data Includes: Last 1 Day]   [id number]  COLOR YELLOW   APPEARANCE CLOUDY   SPECIFIC GRAVITY 1.010   pH 6.0   GLUCOSE NEG mg/dL  BILIRUBIN NEG   KETONE NEG mg/dL  BLOOD MOD   PROTEIN NEG mg/dL  UROBILINOGEN 0.2 mg/dL  NITRITE NEG   LEUKOCYTE ESTERASE MOD   SQUAMOUS EPITHELIAL/HPF FEW   WBC 3-6 WBC/hpf  RBC 11-20 RBC/hpf  BACTERIA MANY   CRYSTALS NONE SEEN   CASTS NONE SEEN   Other AMORPHOUS PRESENT    I reviewed her  KUB x-rays from June and July. Findings are as dictated above. I also reviewed her last CT scan from 2012 which did indicate to smaller renal calculi which appeared to grown to 1.5 cm and 1 cm respectively on her current KUB x-ray.   Assessment Assessed  1. Nephrolithiasis (592.0) 2. Microscopic hematuria (599.72)  Plan Chronic cystitis  1. URINE CULTURE; Status:In Progress - Specimen/Data Collected;    Done: [id number] Health Maintenance  2. UA With REFLEX; [Do Not Release]; Status:Complete;   Done: [id number] 09:16AM Microscopic hematuria, Ureteral stone  3. Follow-up Office  Follow-up - will call to schedule surgery after CT scan results are back.   Status: Hold For - Appointment,Date of Service  Requested for: [phone] Ureteral stone  4. VENIPUNCTURE; Status:Complete;   Done: [id number]  Discussion/Summary 1. Large burden right renal calculi: This will be confirmed with a CT scan. Considering the size of her stones, we discussed treatment options and I did recommend that she strongly consider percutaneous nephrostolithotomy for treatment for optimal care. We did discuss alternative options including shockwave lithotripsy and ureteroscopic laser lithotripsy although I felt that these would be less optimal treatments for her stone burden. She will undergo a CT scan with and without contrast to further define her collecting system in her anatomy. If appropriate, she will be scheduled for a right percutaneous nephrostolithotomy. We have reviewed the potential risks, complications, and expected recovery process associated with this procedure.    2. Recurrent UTIs: Her urine has been cultured today. She will be treated preoperatively prior to her procedure. Considering the fact that she has had recurrent Klebsiella infections, this very likely would indicate that she has a high chance of having a struvite stone which is likely the source of her recurrent bacteriuria.    Cc: Dr. Crist Infante     Verified Results AU CT-HEMATURIA PROTOCOL2 [id number] 12:00AM2 Anselm Pancoast  [Nov 06, 2013 6:09PM Aarianna Hoadley] Please notify patient that CT scan confirms large stones in right kidney without any other concerning findings in the urinary tract. The only other finding appears to be some chronic changes in the liver which are stable from her prior CT scans in the past. I will plan to check her liver  function tests preoperatively and if normal, this will not require further evaluation.   Test Name Result Flag Reference  AU CT-HEMATURIA PROTOCOL2 (Report)2    ** RADIOLOGY REPORT BY [facility] **   CLINICAL DATA: Micro hematuria.  EXAM: CT ABDOMEN AND PELVIS WITHOUT AND WITH CONTRAST  TECHNIQUE: Multidetector CT imaging of the abdomen and pelvis was performed following the standard protocol before and following the bolus administration of intravenous contrast.  CONTRAST: 125 cc Isovue 300 IV  COMPARISON: CT 08/15/2012  FINDINGS: Lung bases are clear. No effusions. Heart is normal size.  Postoperative changes within the stomach. Prior cholecystectomy. Hepatomegaly again noted with prominence of the left hepatic lobe and mildly nodular contours. This appearance is stable since prior study, raising the possibility of cirrhosis. Spleen, pancreas, adrenals are unremarkable.  Right mid and lower pole renal calculi noted, the largest measuring up to 12 mm. No ureteral stones or hydronephrosis. Urinary bladder is unremarkable. Post-contrast images demonstrate no focal area of abnormal enhancement. Symmetric enhancement bilaterally.  Large stool burden throughout the colon. Small bowel is decompressed. No free fluid, free air or adenopathy. No adnexal masses.  Aorta is normal caliber with scattered calcifications. Stable postsurgical changes in the anterior abdominal wall. No free fluid, free air or  adenopathy.  Delayed images demonstrate normal caliber ureters without focal abnormality. Urinary bladder wall is grossly unremarkable.  Small hiatal hernia. No acute bony abnormality. Degenerative changes throughout the thoracolumbar spine.  IMPRESSION: Right mid to lower pole nephrolithiasis. No ureteral stones or hydronephrosis. No focal renal abnormality.  Hepatic morphology suggestive of cirrhosis, stable since prior study. Recommend clinical  correlation.  Postoperative changes within the stomach.  Moderate stool burden throughout the colon.   Electronically Signed  By: Rolm Baptise M.D.  On: 11/06/2013 15:25   URINE CULTURE1 [id number] 09:41AM1 [employer]  SOURCE : CLEAN CATCH SPECIMEN TYPE: URINE  [Nov 06, 2013 8:10AM Arvle Grabe] Please notify patient to begin Cipro 500 mg po bid 3 days prior to her upcoming surgery. Schedulers will be calling her. I have called in Cipro for her. She will have additional pills to take after she leaves the hospital following procedure as well.   Test Name Result Flag Reference  CULTURE, URINE1 Culture, Urine1    ===== COLONY COUNT: =====  60,000 COLONIES/ML   FINAL REPORT: KLEBSIELLA PNEUMONIAE   SENSITIVITY FOR: KLEBSIELLA PNEUMONIAE    AMPICILLIN                             RESISTANT       >=32    AMOX/CLAVULANIC                        SENSITIVE        <=2    AMPICILLIN/SUL                         SENSITIVE          4    PIPERACILLIN/TAZO                      SENSITIVE        <=4    IMIPENEM                               SENSITIVE     <=0.25    CEFAZOLIN                              SENSITIVE        <=4    CEFTRIAXONE                            SENSITIVE        <=1    CEFTAZIDIME                            SENSITIVE        <=1    CEFEPIME                               SENSITIVE        <=1    GENTAMICIN                             SENSITIVE        <=1    TOBRAMYCIN  SENSITIVE        <=1    CIPROFLOXACIN                          SENSITIVE     <=0.25    LEVOFLOXACIN                           SENSITIVE     <=0.12    NITROFURANTOIN                         INDETERMINATE     64    TRIMETH/SULFA                          SENSITIVE       <=20    END OF REPORT     1. Amended By: Raynelle Bring; Nov 06 2013 8:10 AM EST  2. Amended By: Raynelle Bring; Nov 06 2013 6:09 PM EST  Signatures Electronically signed by : Raynelle Bring, M.D.; Nov 06 2013   6:09PM EST

## 2013-12-10 NOTE — Progress Notes (Addendum)
RT placed patient on CPAP. Home setting is 14 cmH2O. RT put sterile water in water chamber for humidification. Encouraged patient to call RN if she needs any assisstant. RT will continue to monitor and  Assess as  needed.

## 2013-12-10 NOTE — H&P (Signed)
Chief Complaint: Right kidney stones  Referring Physician(s): Dr. Windle Guard  History of Present Illness: KYAIRA TRANTHAM is a 63 y.o. female with history of large burden right renal calculi, recurrent UTI's/microscopic hematuria and recent unsuccessful attempt at right PCN 12/03/13 secondary to stone burden . She subsequently underwent cystoscopy with right ureteroscopic laser lithotripsy/stent placement on 12/03/13 by Dr. Alinda Money. She presents today for repeat attempt at right percutaneous nephrostomy prior to nephrolithotomy to treat residual stone burden.  Past Medical History  Diagnosis Date  . Urticaria   . Fibromyalgia   . Tachycardia   . DM (diabetes mellitus)   . Hyperlipidemia   . Bell's palsy   . Hypertension   . Hypothyroidism   . Obesity   . UTI (urinary tract infection)   . Nephrolithiasis   . Anemia   . Carbuncle and furuncle of trunk   . Fracture MAY 2014    HX OF FRACTURED NECK C1- CAUSES SEVERE HEADACHES--LIMITED ROM NECK  . Disorder of fascia     HX OF NECROTIC FASCITIS AFTER ABDOMINAL SURGERY FOR HERNIA- REQUIRED 19 SURGERIES AND 2.5 MONTH HOSPITALIZATION AT [facility]  . Pain     LEFT SHOULDER  PAIN -HARD TO LIE ON LEFT SIDE FOR LONG PERIOD;  PAIN IN LOWER BADK - 3 HERNIATED DISCS AND STENOISIS -  . Elevated cholesterol   . Heart murmur     DOES NOT CAUSE ANY PROBLEMS  . Dysrhythmia     HX OF TACHYCARDIA AND BRADYCARDIA - ON GOING FOR YEARS - DOES NOT HAVE TO SEE CARDIOLOGIST  . Asthma     OCCAS  . Migraine   . Arthritis   . Urinary frequency   . Restless leg syndrome   . OSA (obstructive sleep apnea)     USES CPAP - DOES NOT KNOW SETTING  . Frequent infections     ESPECIALLY PRONE TO INFECTIONS AFTER SURGERIES  . PONV (postoperative nausea and vomiting)     THE GAS MAKES ME NAUSEATED    Past Surgical History  Procedure Laterality Date  . Appendectomy    . Abdominal hysterectomy    . Cholecystectomy    . Cesarean section      X 3  . Carpal  tunnel release      BILATERAL  . Knee arthroscopy  RIGHT AND LEFT    X 2  . Shoulder arthroscopy Right     X 2  . Laparoscopic gastric sleeve resection  2014  . Hernia repair      ABDOMINAL HERNIA REPAIR WITH MESH - 3 SURGERIES   . Joint replacement      TOTAL RIGHT KNEE REPLACEMENT  . Right foot drainage of infection    . I&d of infected site in belly - from an injection    . Tonsillectomy      AND ADENOIDECTOMY  . Hx of 19 surgeries for necrotic fascitis    . Cystoscopy with ureteroscopy Right 12/03/2013    Procedure: CYSTOSCOPY WITH RIGHT RETROGRADE URETEROSCOPY LASER LITHOTRIPSY RIGHT STONE RIGHT URETERAL STENT, ;  Surgeon: Raynelle Bring, MD;  Location: [facility];  Service: Urology;  Laterality: Right;  PROCEDURE WAS ORIGINALLY SCHEDULED AS RIGHT PERCUTANEOUS NEPHROLITHOTOMY    Allergies: Codeine; Imitrex; Morphine; Nsaids; Promethazine hcl; and Sulfonamide derivatives  Medications: Prior to Admission medications   Medication Sig Start Date End Date Taking? Authorizing Provider  ciprofloxacin (CIPRO) 500 MG tablet Take 500 mg by mouth 2 (two) times daily. Pt states she is take cipro  the 3 days before her surgery    Historical Provider, MD  fenofibrate 160 MG tablet Take 160 mg by mouth every evening.     Historical Provider, MD  FLUoxetine (PROZAC) 20 MG capsule Take 20 mg by mouth at bedtime.     Historical Provider, MD  gabapentin (NEURONTIN) 100 MG capsule Take 100 mg by mouth 3 (three) times daily.    Historical Provider, MD  guaiFENesin (MUCINEX) 600 MG 12 hr tablet Take 1,200 mg by mouth 2 (two) times daily.    Historical Provider, MD  insulin aspart (NOVOLOG) 100 UNIT/ML injection Inject 5 Units into the skin once as needed for high blood sugar. Per sliding scale    Historical Provider, MD  insulin detemir (LEVEMIR) 100 UNIT/ML injection Inject 15 Units into the skin 2 (two) times daily.     Historical Provider, MD  IRON PO Take 1 tablet by mouth daily.    Historical Provider,  MD  levothyroxine (SYNTHROID, LEVOTHROID) 75 MCG tablet Take 75 mcg by mouth See admin instructions. Mondays takes one tablet (90mcg). Rest of week takes half of a pill. Takes in the morning.    Historical Provider, MD  LORazepam (ATIVAN) 0.5 MG tablet Take 0.5 mg by mouth 3 (three) times daily as needed for anxiety.    Historical Provider, MD  losartan (COZAAR) 50 MG tablet Take 50 mg by mouth 2 (two) times daily.     Historical Provider, MD  Magnesium 250 MG TABS Take 1 tablet by mouth 4 (four) times daily.    Historical Provider, MD  metoprolol succinate (TOPROL-XL) 50 MG 24 hr tablet Take 50 mg by mouth 3 (three) times daily. Take with or immediately following a meal.    Historical Provider, MD  montelukast (SINGULAIR) 10 MG tablet Take 10 mg by mouth daily as needed (Aching/tightness in chest.).     Historical Provider, MD  omeprazole (PRILOSEC) 20 MG capsule Take 20 mg by mouth every evening.    Historical Provider, MD  ondansetron (ZOFRAN) 4 MG tablet Take 1 tablet (4 mg total) by mouth every 8 (eight) hours as needed for nausea or vomiting. 12/03/13   Raynelle Bring, MD  oxyCODONE-acetaminophen (PERCOCET/ROXICET) 5-325 MG per tablet Take 1 tablet by mouth every 4 (four) hours as needed for moderate pain or severe pain.    Historical Provider, MD  Probiotic Product (PROBIOTIC DAILY PO) Take 1-2 capsules by mouth daily.    Historical Provider, MD  rOPINIRole (REQUIP) 2 MG tablet Take 2 mg by mouth 4 (four) times daily.     Historical Provider, MD    Family History  Problem Relation Age of Onset  . Diabetes Father   . Osteoarthritis Father   . Heart disease Father   . Ulcers Father   . Stroke Mother     History   Social History  . Marital Status: Married    Spouse Name: N/A    Number of Children: 3  . Years of Education: N/A   Occupational History  . disabled    Social History Main Topics  . Smoking status: Never Smoker   . Smokeless tobacco: Never Used  . Alcohol Use: No  .  Drug Use: No  . Sexual Activity: None   Other Topics Concern  . None   Social History Narrative  . None         Review of Systems  Constitutional: Negative for fever and chills.  Respiratory: Negative for cough and shortness of breath.  Cardiovascular: Negative for chest pain.  Gastrointestinal: Negative for vomiting and blood in stool.       Occ lower abd cramping/nausea  Genitourinary: Positive for urgency and hematuria. Negative for dysuria and flank pain.  Musculoskeletal: Negative for back pain.  Neurological: Negative for headaches.  Hematological: Does not bruise/bleed easily.    Vital Signs: BP 100/56  Pulse 66  Temp(Src) 98.3 F (36.8 C) (Oral)  Resp 16  SpO2 100%  Physical Exam  Constitutional: She is oriented to person, place, and time. She appears well-developed and well-nourished.  Cardiovascular: Normal rate and regular rhythm.   Pulmonary/Chest: Effort normal and breath sounds normal.  Abdominal: Soft. Bowel sounds are normal.  Obese; mild lower abd/pelvic tenderness  Musculoskeletal: Normal range of motion. She exhibits no edema.  Neurological: She is alert and oriented to person, place, and time.    Imaging: Dg Chest 2 View  11/27/2013   CLINICAL DATA:  History of hypertension, nonsmoker. For percutaneous nephrolithotomy.  EXAM: CHEST  2 VIEW  COMPARISON:  08/15/2012  FINDINGS: The heart size and mediastinal contours are within normal limits. Both lungs are clear. Mid thoracic degenerative changes are present. Surgical clips are noted in the upper abdomen.  IMPRESSION: No active cardiopulmonary disease.   Electronically Signed   By: Shon Hale M.D.   On: 11/27/2013 10:47   Ir Oris Drone Cath Perc Right  12/03/2013   CLINICAL DATA:  63 year old female with a history of right-sided nephrolithiasis and recurrent UTI. She has been referred for percutaneous access for nephrolithotomy.  EXAM: Attempted right-sided PERCUTANEOUS NEPHROSTOMY CATHETER  PLACEMENT UNDER ULTRASOUND AND FLUOROSCOPIC GUIDANCE  FLUOROSCOPY TIME:  35.4 min  TECHNIQUE: Rightflank region prepped with Betadine, draped in usual sterile fashion, infiltrated locally with 1% lidocaine.  Procedural antibiotics was infused, 400 mg of IV Cipro.  Scout fluoroscopic image was performed to identify the stones in the right-sided collecting system. Fluoroscopic guidance was used to directly puncture the stone in the right-sided collecting system using a 21 gauge Chiba needle. The collecting system was opacified with contrast via this access needle with images stored and sent to PACs.  Once the lower pole calices were opacified with contrast, a second access site was performed with a 21 gauge Chiba needle using fluoroscopic guidance. The needle tip was confirmed in position by infusing a small amount of contrast, and then attempted modified Seldinger technique was performed with a 0.018 Nitrex wire. With the Nitrex wire positioned within the collecting system, the access needle was removed. At this point, the inner dilator/cannula of an [place] system was attempted to be passed over the micro wire. This was unsuccessful given the decompressed collecting system of the right kidney, and the lower pole stone was obstructing the path of the Accustick sheath.  Separate access was achieved in 3 separate calices of the right-sided lower pole collecting system for access to the right side lower pole stones. At no point were we able to thread an Accustick access system into the collecting system of the right kidney.  After 35 min of fluoro, the patient's urologist Dr. Alinda Money was advised of the findings, and an alternative treatment plan was determined. The patient will proceed on today's date with ureteroscopy and will potentially return in the near future for another attempt at percutaneous access for nephrolithotomy.  During the procedure the patient remained hemodynamically normal and tolerated the  procedure well. No significant blood loss was encounter.  FINDINGS: Initial fluoroscopic image demonstrates right-sided collecting system stones within  the lower pole. Images during the case demonstrate access still lower pole collecting system via separate fluoroscopic guided needle placement. Largest stone within the lower pole collecting system obstructs the infundibulum/major calyx of the lower pole. Images demonstrate inability to thread the access sheath into the collecting system of the renal pelvis. The collecting system is decompressed with no evidence of hydronephrosis. Contrast flowed rapidly through the collecting system through the ureter.  IMPRESSION: 1. Attempted fluoroscopically guided percutaneous access for right-sided nephrolithotomy was unsuccessful. The patient will proceed on today's date with ureteroscopy and stone debulking with potential future follow-up for another right-sided access placement.  Signed,  Dulcy Fanny. Earleen Newport, DO  [facility]  [facility]   Electronically Signed   By: Corrie Mckusick O.D.   On: 12/03/2013 12:40    Labs: Lab Results  Component Value Date   WBC 2.5* 11/27/2013   HCT 33.8* 11/27/2013   MCV 86.4 11/27/2013   PLT 166 11/27/2013   NA 140 11/27/2013   K 4.4 11/27/2013   CL 103 11/27/2013   CO2 26 11/27/2013   GLUCOSE 117* 11/27/2013   BUN 37* 11/27/2013   CREATININE 0.84 11/27/2013   CALCIUM 10.4 11/27/2013   PROT 7.9 11/27/2013   ALBUMIN 4.0 11/27/2013   AST 23 11/27/2013   ALT 17 11/27/2013   ALKPHOS 61 11/27/2013   BILITOT 0.2* 11/27/2013   GFRNONAA 74* 11/27/2013   GFRAA 85* 11/27/2013   INR 1.14 11/27/2013    Assessment and Plan: DAMEKA YOUNKER is a 63 y.o. female with history of large burden right renal calculi, recurrent UTI's/microscopic hematuria and recent unsuccessful attempt at right PCN 12/03/13 secondary to stone burden . She subsequently underwent cystoscopy with right ureteroscopic laser lithotripsy/stent placement on  12/03/13 by Dr. Alinda Money. She presents today for repeat attempt at right percutaneous nephrostomy prior to nephrolithotomy to treat residual stone burden. Details/risks of procedure d/w pt with her understanding and consent.   Rowe Robert, PAC

## 2013-12-10 NOTE — Op Note (Signed)
Preoperative diagnosis:  1. Right renal calculi  Postoperative diagnosis: 1. Right renal calculi  Procedure(s): 1. Right percutaneous nephrostolithotomy ( < 2 cm)  Surgeon: Dr. Roxy Horseman, Jr  Anesthesia: General  Complications: None  EBL: 150 cc  Specimens: Right renal calculi  Disposition of specimens: For analysis  Indication: [name] is a 64 year old with urolithiasis and right renal calculi with recurrent UTIs felt to possibly be related to her her renal stones.  She underwent right ureteroscopic laser lithotripsy last week when percutaneous access was unable to be obtained.  She returned today for another attempt at percutaneous access and to complete treatment of her stone burden. I discussed the potential benefits and risks of the procedure, side effects of the proposed treatment, the likelihood of the patient achieving the goals of the procedure, and any potential problems that might occur during the procedure or recuperation. She gave her informed consent to proceed.  Description of procedure:  Following her procedure in interventional radiology where percutaneous access was obtained to the right kidney, she was transferred to the operating room and general anesthesia was induced.  She was placed in the prone position with care to pad all potential pressure points and care to keep her cervical spine neutral.  Preoperative antibiotics had been administered prior to her interventional radiology procedure.  She was prepped and draped in usual sterile fashion around her right flank.  A preoperative timeout was performed.  I placed a stiff Amplatz wire down her angiographic catheter into the bladder under fluoroscopic guidance and removed the angiographic catheter.  I then placed a peel-away coaxial sheath over this wire into the proximal ureter.  I placed a Glidewire through this sheath and down into the bladder under fluoroscopic guidance.  The angiographic catheter was  then placed over the Glidewire which was exchanged for another stiff Amplatz wire therefore allowing 2 wires to remain in place with complete access down the ureter to the bladder. Over the working wire, I then placed the 30 French nephrostomy balloon dilator.  An incision at the skin.  The balloon was then dilated and the sheath was placed over the balloon just into the lower pole posterior calyx.  The balloon was then removed and I inspected the renal collecting system with the rigid nephroscope. Multiple clots were removed with the 3 prong grasper.  I also removed some stone fragments with 3 prong grasper.  Finally, I identified the patient's indwelling double-J ureteral stent and removed it with the grasper.  Multiple smaller stone fragments were identified and the majority remained in the lower pole calyx which was accessed.  Using the lithoclast ultra, I suctioned out all small fragments and utilized the ultrasound lithotripsy.  Once all stone fragments were visibly removed, I inspected the other areas of the renal pelvis and the lower pole which appeared to be stone free.  I then placed the flexible nephroscope and examined the entire renal collecting system including the upper pole which appeared to be free of any stones.  I did identify a few small stones in the lower pole which again were removed after the rigid nephroscope was replaced and the lithoclast ultra was used to fragment and resect the stones.  At this point, the patient appeared to be rendered stone free.  I removed the nephroscope and placed a 22 [place] [employer] catheter through the nephrostomy sheath into the renal pelvis and positioned it appropriately.  The balloon was inflated with approximately 2 cc of sterile  saline and it was secured to skin with silk sutures.  I then placed an angiographic catheter over the safety wire and secured this to the skin with silk sutures.  A sterile dressing was applied.  Nephrostomy tube was placed to  straight drainage.  She tolerated the procedure well without complications.  She was able to be extubated and transferred to the recovery unit in satisfactory condition.

## 2013-12-10 NOTE — Procedures (Signed)
Procedure:  US/Fluoro guided Right PCN for PCNL on today's date.  54F Kumpe catheter through posterior, inferior calyx into the urinary bladder.  No complication No significant blood loss.  Patient tolerated the procedure well. NPO for surgery today. Signed,  Johny Shears, DO

## 2013-12-10 NOTE — Discharge Instructions (Addendum)
1.  Activity:  You are encouraged to ambulate frequently (about every hour during waking hours) to help prevent blood clots from forming in your legs or lungs.  However, you should not engage in any heavy lifting (> 10-15 lbs), strenuous activity, or straining. 2. Diet: You should advance your diet as instructed by your physician.  It will be normal to have some bloating, nausea, and abdominal discomfort intermittently. 3. Prescriptions:  You will be provided a prescription for pain medication to take as needed.  If your pain is not severe enough to require the prescription pain medication, you may take extra strength Tylenol instead which will have less side effects.  You should also take a prescribed stool softener to avoid straining with bowel movements as the prescription pain medication may constipate you. 4. What to call us about: You should call the office [phone]) if you develop fever > 101 or develop persistent vomiting.       5.   Do not take Cozaar right now.  Monitor blood pressure and if systolic (upper) number is > 140, you may restart this medication.  Please call if systolic number is consistent below 100 or if you develop dizziness or light headedness.

## 2013-12-10 NOTE — Transfer of Care (Signed)
Immediate Anesthesia Transfer of Care Note  Patient: [name]  Procedure(s) Performed: Procedure(s): NEPHROLITHOTOMY PERCUTANEOUS (Right)  Patient Location: PACU  Anesthesia Type:General  Level of Consciousness: awake, sedated and patient cooperative  Airway & Oxygen Therapy: Patient Spontanous Breathing and Patient connected to face mask oxygen  Post-op Assessment: Report given to PACU RN and Post -op Vital signs reviewed and stable  Post vital signs: Reviewed and stable  Complications: No apparent anesthesia complications

## 2013-12-10 NOTE — Interval H&P Note (Signed)
History and Physical Interval Note:  12/10/2013 3:11 PM  [name]  has presented today for surgery, with the diagnosis of RIGHT RENAL CALCULI.  She was scheduled for a right PCNL last week but percutaneous access was unable to be obtained due to her stone burden obstructing access down the ureter past the lower pole calyx.  She underwent ureteroscopic laser lithotripsy with debulking of her stone and returns today for percutaneous therapy.  Percutaneous access was able to be obtained in Interventional radiology today and images have been reviewed.  The various methods of treatment have been discussed with the patient and family. After consideration of risks, benefits and other options for treatment, the patient has consented to  Procedure(s): NEPHROLITHOTOMY PERCUTANEOUS (Right) HOLMIUM LASER APPLICATION (Right) as a surgical intervention .  The patient's history has been reviewed, patient examined, no change in status, stable for surgery.  I have reviewed the patient's chart and labs.  Questions were answered to the patient's satisfaction.     Yona Stansbury,LES

## 2013-12-10 NOTE — Anesthesia Preprocedure Evaluation (Signed)
Anesthesia Evaluation  Patient identified by MRN, date of birth, ID band Patient awake    Reviewed: Allergy & Precautions, H&P , NPO status , Patient's Chart, lab work & pertinent test results, reviewed documented beta blocker date and time   History of Anesthesia Complications (+) PONV and history of anesthetic complications  Airway Mallampati: II TM Distance: >3 FB Neck ROM: Full    Dental no notable dental hx.    Pulmonary asthma , sleep apnea ,  breath sounds clear to auscultation  Pulmonary exam normal       Cardiovascular Exercise Tolerance: Good hypertension, Pt. on medications and Pt. on home beta blockers + dysrhythmias + Valvular Problems/Murmurs Rhythm:Regular Rate:Normal     Neuro/Psych  Headaches, PSYCHIATRIC DISORDERS Anxiety Depression Bells palsy, followed by neurology  Neuromuscular disease    GI/Hepatic negative GI ROS, Neg liver ROS,   Endo/Other  diabetes, Type 2, Insulin DependentHypothyroidism   Renal/GU Renal disease  Female GU complaint     Musculoskeletal negative musculoskeletal ROS (+) Arthritis -, Fibromyalgia -  Abdominal   Peds negative pediatric ROS (+)  Hematology  (+) anemia ,   Anesthesia Other Findings   Reproductive/Obstetrics negative OB ROS                           Anesthesia Physical  Anesthesia Plan  ASA: III  Anesthesia Plan: General   Post-op Pain Management:    Induction: Intravenous  Airway Management Planned: Oral ETT  Additional Equipment:   Intra-op Plan:   Post-operative Plan: Extubation in OR  Informed Consent: I have reviewed the patients History and Physical, chart, labs and discussed the procedure including the risks, benefits and alternatives for the proposed anesthesia with the patient or authorized representative who has indicated his/her understanding and acceptance.   Dental advisory given  Plan Discussed with:  CRNA  Anesthesia Plan Comments:         Anesthesia Quick Evaluation

## 2013-12-11 ENCOUNTER — Encounter (HOSPITAL_COMMUNITY): Payer: Self-pay | Admitting: Urology

## 2013-12-11 DIAGNOSIS — Z794 Long term (current) use of insulin: Secondary | ICD-10-CM | POA: Diagnosis not present

## 2013-12-11 DIAGNOSIS — Z888 Allergy status to other drugs, medicaments and biological substances status: Secondary | ICD-10-CM | POA: Diagnosis not present

## 2013-12-11 DIAGNOSIS — E039 Hypothyroidism, unspecified: Secondary | ICD-10-CM | POA: Diagnosis not present

## 2013-12-11 DIAGNOSIS — E119 Type 2 diabetes mellitus without complications: Secondary | ICD-10-CM | POA: Diagnosis present

## 2013-12-11 DIAGNOSIS — Z8052 Family history of malignant neoplasm of bladder: Secondary | ICD-10-CM | POA: Diagnosis not present

## 2013-12-11 DIAGNOSIS — Z882 Allergy status to sulfonamides status: Secondary | ICD-10-CM | POA: Diagnosis not present

## 2013-12-11 DIAGNOSIS — Z8249 Family history of ischemic heart disease and other diseases of the circulatory system: Secondary | ICD-10-CM | POA: Diagnosis not present

## 2013-12-11 DIAGNOSIS — I959 Hypotension, unspecified: Secondary | ICD-10-CM | POA: Diagnosis not present

## 2013-12-11 DIAGNOSIS — E785 Hyperlipidemia, unspecified: Secondary | ICD-10-CM | POA: Diagnosis present

## 2013-12-11 DIAGNOSIS — I10 Essential (primary) hypertension: Secondary | ICD-10-CM | POA: Diagnosis present

## 2013-12-11 DIAGNOSIS — Z96659 Presence of unspecified artificial knee joint: Secondary | ICD-10-CM | POA: Diagnosis not present

## 2013-12-11 DIAGNOSIS — J45909 Unspecified asthma, uncomplicated: Secondary | ICD-10-CM | POA: Diagnosis present

## 2013-12-11 DIAGNOSIS — Z885 Allergy status to narcotic agent status: Secondary | ICD-10-CM | POA: Diagnosis not present

## 2013-12-11 DIAGNOSIS — Z79899 Other long term (current) drug therapy: Secondary | ICD-10-CM | POA: Diagnosis not present

## 2013-12-11 DIAGNOSIS — Z833 Family history of diabetes mellitus: Secondary | ICD-10-CM | POA: Diagnosis not present

## 2013-12-11 DIAGNOSIS — Z823 Family history of stroke: Secondary | ICD-10-CM | POA: Diagnosis not present

## 2013-12-11 DIAGNOSIS — IMO0001 Reserved for inherently not codable concepts without codable children: Secondary | ICD-10-CM | POA: Diagnosis present

## 2013-12-11 DIAGNOSIS — T819XXA Unspecified complication of procedure, initial encounter: Secondary | ICD-10-CM | POA: Diagnosis present

## 2013-12-11 DIAGNOSIS — N200 Calculus of kidney: Secondary | ICD-10-CM | POA: Diagnosis not present

## 2013-12-11 DIAGNOSIS — M129 Arthropathy, unspecified: Secondary | ICD-10-CM | POA: Diagnosis present

## 2013-12-11 DIAGNOSIS — D62 Acute posthemorrhagic anemia: Secondary | ICD-10-CM | POA: Diagnosis not present

## 2013-12-11 DIAGNOSIS — E7800 Pure hypercholesterolemia, unspecified: Secondary | ICD-10-CM | POA: Diagnosis present

## 2013-12-11 DIAGNOSIS — Z87442 Personal history of urinary calculi: Secondary | ICD-10-CM | POA: Diagnosis not present

## 2013-12-11 DIAGNOSIS — Z87440 Personal history of urinary (tract) infections: Secondary | ICD-10-CM | POA: Diagnosis not present

## 2013-12-11 DIAGNOSIS — G4733 Obstructive sleep apnea (adult) (pediatric): Secondary | ICD-10-CM | POA: Diagnosis present

## 2013-12-11 DIAGNOSIS — Z9089 Acquired absence of other organs: Secondary | ICD-10-CM | POA: Diagnosis not present

## 2013-12-11 DIAGNOSIS — G2581 Restless legs syndrome: Secondary | ICD-10-CM | POA: Diagnosis present

## 2013-12-11 LAB — GLUCOSE, CAPILLARY
Glucose-Capillary: 79 mg/dL (ref 70–99)
Glucose-Capillary: 151 mg/dL — ABNORMAL HIGH (ref 70–99)
Glucose-Capillary: 108 mg/dL — ABNORMAL HIGH (ref 70–99)
Glucose-Capillary: 203 mg/dL — ABNORMAL HIGH (ref 70–99)
Glucose-Capillary: 103 mg/dL — ABNORMAL HIGH (ref 70–99)
Glucose-Capillary: 127 mg/dL — ABNORMAL HIGH (ref 70–99)

## 2013-12-11 LAB — BASIC METABOLIC PANEL
Anion gap: 7 (ref 5–15)
BUN: 30 mg/dL — ABNORMAL HIGH (ref 6–23)
CO2: 26 mEq/L (ref 19–32)
Calcium: 7.9 mg/dL — ABNORMAL LOW (ref 8.4–10.5)
Chloride: 105 mEq/L (ref 96–112)
Creatinine, Ser: 1.18 mg/dL — ABNORMAL HIGH (ref 0.50–1.10)
GFR calc Af Amer: 56 mL/min — ABNORMAL LOW (ref >90)
GFR calc non Af Amer: 49 mL/min — ABNORMAL LOW (ref >90)
Glucose, Bld: 162 mg/dL — ABNORMAL HIGH (ref 70–99)
Potassium: 4.2 mEq/L (ref 3.7–5.3)
Sodium: 138 mEq/L (ref 137–147)

## 2013-12-11 LAB — HEMOGLOBIN AND HEMATOCRIT, BLOOD
HCT: 18.1 % — ABNORMAL LOW (ref 36.0–46.0)
HCT: 22.5 % — ABNORMAL LOW (ref 36.0–46.0)
HCT: 21.2 % — ABNORMAL LOW (ref 36.0–46.0)
HCT: 25.6 % — ABNORMAL LOW (ref 36.0–46.0)
HCT: 23.6 % — ABNORMAL LOW (ref 36.0–46.0)
Hemoglobin: 6.1 g/dL — CL (ref 12.0–15.0)
Hemoglobin: 7.6 g/dL — ABNORMAL LOW (ref 12.0–15.0)
Hemoglobin: 7.3 g/dL — ABNORMAL LOW (ref 12.0–15.0)
Hemoglobin: 8.6 g/dL — ABNORMAL LOW (ref 12.0–15.0)
Hemoglobin: 7.8 g/dL — ABNORMAL LOW (ref 12.0–15.0)

## 2013-12-11 LAB — PREPARE RBC (CROSSMATCH)

## 2013-12-11 LAB — MRSA PCR SCREENING: MRSA by PCR: POSITIVE — AB

## 2013-12-11 MED ORDER — SODIUM CHLORIDE 0.9 % IV BOLUS (SEPSIS)
1000.0000 mL | Freq: Once | INTRAVENOUS | Status: AC
  Administered 2013-12-11: 1000 mL via INTRAVENOUS

## 2013-12-11 MED ORDER — METOPROLOL TARTRATE 25 MG PO TABS
25.0000 mg | ORAL_TABLET | Freq: Three times a day (TID) | ORAL | Status: DC
  Administered 2013-12-11: 25 mg via ORAL
  Filled 2013-12-11 (×4): qty 1

## 2013-12-11 MED ORDER — MUPIROCIN 2 % EX OINT
1.0000 "application " | TOPICAL_OINTMENT | Freq: Two times a day (BID) | CUTANEOUS | Status: DC
  Administered 2013-12-12: 1 via NASAL
  Filled 2013-12-11: qty 22

## 2013-12-11 MED ORDER — CHLORHEXIDINE GLUCONATE CLOTH 2 % EX PADS
6.0000 | MEDICATED_PAD | Freq: Every day | CUTANEOUS | Status: DC
  Administered 2013-12-12: 6 via TOPICAL

## 2013-12-11 MED ORDER — SODIUM CHLORIDE 0.9 % IV SOLN: Freq: Once | INTRAVENOUS | Status: DC

## 2013-12-11 MED ORDER — DEXTROSE 5 % IV SOLN
1.0000 g | INTRAVENOUS | Status: AC
  Administered 2013-12-11: 1 g via INTRAVENOUS
  Filled 2013-12-11: qty 10

## 2013-12-11 NOTE — Progress Notes (Signed)
Hemeglobin level of 6.1 called to Dr. Junious Silk, also notified him of the low BP and rapid response call, orders received for 2 units of pRBCs.  Pt remains asymptomatic and is currently alert and oriented, sitting up in bed coloring in a color book and texting family with cell phone.  [name]

## 2013-12-11 NOTE — Progress Notes (Signed)
Patient ID: [name], female   DOB: [birth year], 63 y.o.   MRN: [id number]  1 Day Post-Op Subjective: Pt was transferred to the step down ICU last night for hypotension.  She has been asymptomatic with no fever, dizziness, tachycardia, SOB, or palpitations.  Hgb was found to be 6.1 and she was transferred 1 u of PRBCs.  Hgb now pending post-transfusion.  She feels "great" and denies any complaints except for expected soreness at right flank nephrostomy site.  Objective: Vital signs in last 24 hours: Temp:  [97.7 F (36.5 C)-100.1 F (37.8 C)] 99.8 F (37.7 C) (09/22 0536) Pulse Rate:  [60-95] 86 (09/22 0700) Resp:  [10-26] 25 (09/22 0700) BP: (61-120)/(11-69) 90/69 mmHg (09/22 0700) SpO2:  [90 %-100 %] 97 % (09/22 0700) Weight:  [84.3 kg (185 lb 13.6 oz)-87.8 kg (193 lb 9 oz)] 87.8 kg (193 lb 9 oz) (09/22 0415)  Intake/Output from previous day: 09/21 0701 - 09/22 0700 In: 3806.9 [P.O.:120; I.V.:3099.4; Blood:337.5; IV Piggyback:250] Out: 1448 [Urine:1575; Blood:150] Intake/Output this shift:    Physical Exam:  General: Alert and oriented CV: RRR Lungs: Clear Abdomen: Soft, ND, R PCN in place and draining pinkish urine. Ext: NT, No erythema  Lab Results:  Recent Labs  12/10/13 0900 12/10/13 1750 12/11/13 0128  HGB 9.9* 8.3* 6.1*  HCT 30.1* 24.9* 18.1*   BMET  Recent Labs  12/10/13 1750 12/11/13 0128  NA 135* 138  K 4.3 4.2  CL 99 105  CO2 26 26  GLUCOSE 220* 162*  BUN 43* 30*  CREATININE 1.29* 1.18*  CALCIUM 9.1 7.9*     Studies/Results:  Assessment/Plan: POD # 1 s/p R PCNL - Acute blood loss anemia: Await post-transfusion Hgb.  Continue hemodynamic monitoring in ICU this morning.  Will keep Foley for accurate UOP monitoring. Check q 6 hr H/H.  If stabilizes, will consider transfer back to floor later today. - Hypotension: Secondary to anemia. Now improved and stable. Hold Cozaar and metoprolol until Hgb has returned. - Continue nephrostomy tube  drainage which should help tamponade any bleeding. - DM: Continue SSI - DVT prophylaxis, remain on bedrest until Hgb stabilized   LOS: 1 day   Corneisha Alvi,LES 12/11/2013, 7:37 AM

## 2013-12-11 NOTE — Progress Notes (Signed)
Pt's BP were slowly dropping. Urology paged and boluses were given. Pt did not respond to boluses. Rapid Response RN was called and urology was notified. Pt moved to Stepdown. Report given.

## 2013-12-11 NOTE — Progress Notes (Signed)
CARE MANAGEMENT NOTE 12/11/2013  Patient:  RONNELL, MAKAREWICZ   Account Number:  [id number]  Date Initiated:  12/11/2013  Documentation initiated by:  DAVIS,RHONDA  Subjective/Objective Assessment:   pt had outpt surg, post op bleeding and hypotension, requiried iv flds and prbcs for support/     Action/Plan:   home when stable   Anticipated DC Date:  12/14/2013   Anticipated DC Plan:  HOME/SELF CARE  In-house referral  NA      DC Planning Services  CM consult      PAC Choice  NA   Choice offered to / List presented to:  NA   DME arranged  NA      DME agency  NA     [place] arranged  NA      [place] agency  NA   Status of service:  In process, will continue to follow Medicare Important Message given?   (If response is "NO", the following Medicare IM given date fields will be blank) Date Medicare IM given:   Medicare IM given by:   Date Additional Medicare IM given:   Additional Medicare IM given by:    Discharge Disposition:    Per UR Regulation:  Reviewed for med. necessity/level of care/duration of stay  If discussed at [place] of Stay Meetings, dates discussed:   12/11/2013    Comments:  [id number] Davis,RN,BSn,CCM

## 2013-12-11 NOTE — Progress Notes (Signed)
RT placed pt on CPAP at 14cmH2O per home settings via home nasal mask. Sterile water was added for humidification. Pt is resting comfortable. RT will continue to monitor as needed.

## 2013-12-11 NOTE — Progress Notes (Addendum)
Rt placed CPAP on pt, per pt request vitals stable.

## 2013-12-11 NOTE — Progress Notes (Signed)
Patient ID: [name], female   DOB: 1950/06/05, 63 y.o.   MRN: [id number]  Pt has been hemodynamically stable today and Hgb has increased appropriately after her blood transfusion this morning.  She has been asymptomatic even after getting up to a chair.  Urine from nephrostomy remains light pink.  Plan:  - Transfer to floor on telemetry - Continue serial H/H - D/C urethral catheter - Reduce IVF

## 2013-12-12 LAB — CBC WITH DIFFERENTIAL/PLATELET
Basophils Absolute: 0 10*3/uL (ref 0.0–0.1)
Basophils Relative: 0 % (ref 0–1)
Eosinophils Absolute: 0 10*3/uL (ref 0.0–0.7)
Eosinophils Relative: 0 % (ref 0–5)
HCT: 23.2 % — ABNORMAL LOW (ref 36.0–46.0)
Hemoglobin: 7.6 g/dL — ABNORMAL LOW (ref 12.0–15.0)
Lymphocytes Relative: 38 % (ref 12–46)
Lymphs Abs: 1.1 10*3/uL (ref 0.7–4.0)
MCH: 29 pg (ref 26.0–34.0)
MCHC: 32.8 g/dL (ref 30.0–36.0)
MCV: 88.5 fL (ref 78.0–100.0)
Monocytes Absolute: 0.4 10*3/uL (ref 0.1–1.0)
Monocytes Relative: 13 % — ABNORMAL HIGH (ref 3–12)
Neutro Abs: 1.4 10*3/uL — ABNORMAL LOW (ref 1.7–7.7)
Neutrophils Relative %: 49 % (ref 43–77)
Platelets: 161 10*3/uL (ref 150–400)
RBC: 2.62 MIL/uL — ABNORMAL LOW (ref 3.87–5.11)
RDW: 14.1 % (ref 11.5–15.5)
WBC: 2.9 10*3/uL — ABNORMAL LOW (ref 4.0–10.5)

## 2013-12-12 LAB — GLUCOSE, CAPILLARY
Glucose-Capillary: 110 mg/dL — ABNORMAL HIGH (ref 70–99)
Glucose-Capillary: 113 mg/dL — ABNORMAL HIGH (ref 70–99)
Glucose-Capillary: 119 mg/dL — ABNORMAL HIGH (ref 70–99)

## 2013-12-12 LAB — BASIC METABOLIC PANEL
Anion gap: 9 (ref 5–15)
BUN: 17 mg/dL (ref 6–23)
CO2: 24 mEq/L (ref 19–32)
Calcium: 8.8 mg/dL (ref 8.4–10.5)
Chloride: 106 mEq/L (ref 96–112)
Creatinine, Ser: 1.06 mg/dL (ref 0.50–1.10)
GFR calc Af Amer: 64 mL/min — ABNORMAL LOW (ref >90)
GFR calc non Af Amer: 55 mL/min — ABNORMAL LOW (ref >90)
Glucose, Bld: 122 mg/dL — ABNORMAL HIGH (ref 70–99)
Potassium: 4.4 mEq/L (ref 3.7–5.3)
Sodium: 139 mEq/L (ref 137–147)

## 2013-12-12 LAB — URINE CULTURE
Colony Count: NO GROWTH
Culture: NO GROWTH

## 2013-12-12 MED ORDER — METOPROLOL TARTRATE 50 MG PO TABS
50.0000 mg | ORAL_TABLET | Freq: Three times a day (TID) | ORAL | Status: DC
  Administered 2013-12-12: 50 mg via ORAL
  Filled 2013-12-12 (×2): qty 1

## 2013-12-12 NOTE — Progress Notes (Signed)
Patient ID: [name], female   DOB: 07/23/1950, 63 y.o.   MRN: [id number]  2 Days Post-Op Subjective: Pt has remained hemodynamically stable overnight.  Hgb stable as well. No complaints.  Minimal pain.  No dizziness with ambulation.  Objective: Vital signs in last 24 hours: Temp:  [97.6 F (36.4 C)-99.8 F (37.7 C)] 99.1 F (37.3 C) (09/23 0659) Pulse Rate:  [77-104] 88 (09/23 0659) Resp:  [13-23] 18 (09/23 0659) BP: (87-118)/(21-48) 118/47 mmHg (09/23 0659) SpO2:  [95 %-100 %] 100 % (09/23 0659) Weight:  [87.3 kg (192 lb 7.4 oz)] 87.3 kg (192 lb 7.4 oz) (09/22 2100)  Intake/Output from previous day: 09/22 0701 - 09/23 0700 In: 1600 [I.V.:1600] Out: 2165 [Urine:2165] Intake/Output this shift:    Physical Exam:  General: Alert and oriented CV: RRR Lungs: Clear Abdomen: Soft, ND, Nephrostomy draining well with only light pink tinged urine. Ext: NT, No erythema  Lab Results:  Recent Labs  12/11/13 1800 12/11/13 2144 12/12/13 0335  HGB 8.6* 7.8* 7.6*  HCT 25.6* 23.6* 23.2*   7.6 after transfusion yesterday indicating stable Hgb last 24 hours.  BMET  Recent Labs  12/11/13 0128 12/12/13 0335  NA 138 139  K 4.2 4.4  CL 105 106  CO2 26 24  GLUCOSE 162* 122*  BUN 30* 17  CREATININE 1.18* 1.06  CALCIUM 7.9* 8.8    I removed her right nephrostomy tube this morning.  She was then monitored for 15 minutes and no bleeding was noted.  I then removed her safety catheter.  Assessment/Plan: POD # 2 s/p right PCNL - Will d/c home this morning - I have instructed her to call should she become dizzy or light headed. She will monitor her BP at home and notify me if her SBP is consistently below 100. - She has been instructed on wound care - She will continue her metoprolol but will hold her Cozaar and monitor her BP.  She will resume if her SBP increases to > 140.   LOS: 2 days   Braylynn Ghan,LES 12/12/2013, 7:16 AM

## 2013-12-12 NOTE — Progress Notes (Signed)
Patient transferred to floor from the ICU in stable condition.  I agree with previous shift assessment done in ICU and will continue to monitor patient.

## 2013-12-12 NOTE — Discharge Summary (Signed)
Date of admission: 12/10/2013  Date of discharge: 12/12/2013  Admission diagnosis: Right renal calculi, recurrent UTIs  Discharge diagnosis: Right renal calculi, recurrent UTIs  Secondary diagnoses: Diabetes, Chronic anemia, Sleep apnea, hypothyroidism  History and Physical: For full details, please see admission history and physical. Briefly, [name] is a 63 y.o. year old patient with multiple medical problems who presented with recurrent UTIs with urease producing bacteria and renal calculi.  It was felt that her stones could potentially be the source of her infections and she chose to undergo definitive therapy.  Last week, she underwent an attempted procedure in interventional radiology to obtain percutaneous access or a percutaneous nephrostolithotomy.  However, access was unable to be obtained due to the stone burden which was completely obstructing her lower pole calyx.  She therefore underwent ureteroscopy with debulking of her stones and return at this admission to undergo a another attempt at percutaneous access and percutaneous nephrostolithotomy to eradicate her stone burden.     Hospital course: She was taken to interventional radiology on 12/10/13 and did undergo successful percutaneous access of her lower pole calyx.  She was then taken the operating room and underwent dilation of her nephrostomy tract followed by removal of the remaining stone burden within her kidney.  She tolerated this procedure well and without any intraoperative complications.  Postoperatively, she was transferred to a regular hospital room.  Of note, she is chronically anemic and her preoperative hemoglobin was noted to be 9.3.  She was noted to be hypotensive on the evening of postoperative day 0 and her hemoglobin was found to be 0.1.  She was transferred to the stepdown intensive care unit and was transfused 1 unit of packed red blood cells.  She subsequently became hemodynamically stable and had serial  hemoglobin and hematocrits checked remained stable over the next 24 hours indicating no persistent bleeding.  She remained afebrile without other signs of infection throughout her hospital course.  On the evening of postoperative day 1, she was able to be transferred back to a regular hospital room.  She was evaluated on the morning of postoperative day 2 and remained stable.  Her nephrostomy tube was then removed and she was monitored.  She had no evidence of recurrent bleeding and her safety catheter was therefore also removed.  She was felt to be stable for discharge.  Laboratory values:  Recent Labs  12/11/13 1800 12/11/13 2144 12/12/13 0335  HGB 8.6* 7.8* 7.6*  HCT 25.6* 23.6* 23.2*    Recent Labs  12/11/13 0128 12/12/13 0335  CREATININE 1.18* 1.06    Disposition: Home  Discharge instruction: The patient was instructed to be ambulatory but told to refrain from heavy lifting, strenuous activity, or driving. She and her husband have been instructed on appropriate dressing care for her nephrostomy site until it stops draining in the next 2-3 days.  She will call should she develop fever, dizziness, lightheadedness, or low blood pressures.  She has been instructed to monitor her blood pressure and will hold her Cozaar and only restarted if her systolic blood pressure increases over 140.  Discharge medications:    Medication List    STOP taking these medications       losartan 50 MG tablet  Commonly known as:  COZAAR     metoprolol 50 MG tablet  Commonly known as:  LOPRESSOR      TAKE these medications       ciprofloxacin 500 MG tablet  Commonly known as:  CIPRO  Take 500 mg by mouth 2 (two) times daily. Pt states she is take cipro the 3 days before her surgery     fenofibrate 160 MG tablet  Take 160 mg by mouth every evening.     FLUoxetine 20 MG capsule  Commonly known as:  PROZAC  Take 20 mg by mouth at bedtime.     gabapentin 100 MG capsule  Commonly known as:   NEURONTIN  Take 100 mg by mouth 3 (three) times daily.     guaiFENesin 600 MG 12 hr tablet  Commonly known as:  MUCINEX  Take 1,200 mg by mouth 2 (two) times daily.     insulin aspart 100 UNIT/ML injection  Commonly known as:  novoLOG  Inject 5 Units into the skin once as needed for high blood sugar. Per sliding scale     insulin detemir 100 UNIT/ML injection  Commonly known as:  LEVEMIR  Inject 15 Units into the skin 2 (two) times daily.     IRON PO  Take 1 tablet by mouth daily.     levothyroxine 75 MCG tablet  Commonly known as:  SYNTHROID, LEVOTHROID  Take 75 mcg by mouth See admin instructions. Mondays takes one tablet (38mcg). Rest of week takes half of a pill. Takes in the morning.     LORazepam 0.5 MG tablet  Commonly known as:  ATIVAN  Take 0.5 mg by mouth 3 (three) times daily as needed for anxiety.     Magnesium 250 MG Tabs  Take 1 tablet by mouth 4 (four) times daily.     montelukast 10 MG tablet  Commonly known as:  SINGULAIR  Take 10 mg by mouth daily as needed (Aching/tightness in chest.).     omeprazole 20 MG capsule  Commonly known as:  PRILOSEC  Take 20 mg by mouth every evening.     ondansetron 4 MG tablet  Commonly known as:  ZOFRAN  Take 1 tablet (4 mg total) by mouth every 8 (eight) hours as needed for nausea or vomiting.     ondansetron 4 MG tablet  Commonly known as:  ZOFRAN  Take 1 tablet (4 mg total) by mouth every 8 (eight) hours as needed for nausea or vomiting.     ondansetron 4 MG tablet  Commonly known as:  ZOFRAN  Take 1 tablet (4 mg total) by mouth every 8 (eight) hours as needed for nausea or vomiting.     oxyCODONE-acetaminophen 5-325 MG per tablet  Commonly known as:  PERCOCET/ROXICET  Take 1 tablet by mouth every 4 (four) hours as needed for moderate pain or severe pain.     oxyCODONE-acetaminophen 5-325 MG per tablet  Commonly known as:  ROXICET  Take 1 tablet by mouth every 4 (four) hours as needed for severe pain.      PROBIOTIC DAILY PO  Take 1-2 capsules by mouth daily.     rOPINIRole 2 MG tablet  Commonly known as:  REQUIP  Take 2 mg by mouth 4 (four) times daily.        Followup:      Follow-up Information   Follow up with Dutch Gray, MD. (12/25/13 at 10:30)    Specialty:  Urology   Contact information:   [place] [address] [phone]

## 2013-12-12 NOTE — Care Management Note (Signed)
    Page 1 of 2   12/12/2013     10:58:33 AM CARE MANAGEMENT NOTE 12/12/2013  Patient:  [name], [name]   Account Number:  [id number]  Date Initiated:  12/11/2013  Documentation initiated by:  DAVIS,RHONDA  Subjective/Objective Assessment:   pt had outpt surg, post op bleeding and hypotension, requiried iv flds and prbcs for support/     Action/Plan:   home when stable   Anticipated DC Date:  12/12/2013   Anticipated DC Plan:  HOME/SELF CARE  In-house referral  NA      DC Planning Services  CM consult      PAC Choice  NA   Choice offered to / List presented to:  NA   DME arranged  NA      DME agency  NA     [place] arranged  NA      [place] agency  NA   Status of service:  In process, will continue to follow Medicare Important Message given?   (If response is "NO", the following Medicare IM given date fields will be blank) Date Medicare IM given:   Medicare IM given by:   Date Additional Medicare IM given:   Additional Medicare IM given by:    Discharge Disposition:  HOME/SELF CARE  Per UR Regulation:  Reviewed for med. necessity/level of care/duration of stay  If discussed at [place] of Stay Meetings, dates discussed:   12/11/2013    Comments:  12/12/13 Anyla Israelson RN,BSN NCM [phone] D/C HOME NO NEEDS OR ORDERS.  [id number] Davis,RN,BSn,CCM

## 2013-12-14 LAB — TYPE AND SCREEN
ABO/RH(D): A NEG
Antibody Screen: NEGATIVE
Unit division: 0
Unit division: 0

## 2014-07-20 ENCOUNTER — Emergency Department (INDEPENDENT_AMBULATORY_CARE_PROVIDER_SITE_OTHER): Payer: Managed Care, Other (non HMO)

## 2014-07-20 ENCOUNTER — Encounter (HOSPITAL_COMMUNITY): Payer: Self-pay | Admitting: Emergency Medicine

## 2014-07-20 ENCOUNTER — Observation Stay (HOSPITAL_COMMUNITY)
Admission: EM | Admit: 2014-07-20 | Discharge: 2014-07-21 | Disposition: A | Discharge status: Home / Self Care | Payer: Managed Care, Other (non HMO) | Attending: Family Medicine | Admitting: Family Medicine

## 2014-07-20 ENCOUNTER — Emergency Department (INDEPENDENT_AMBULATORY_CARE_PROVIDER_SITE_OTHER)
Admission: EM | Admit: 2014-07-20 | Discharge: 2014-07-20 | Disposition: A | Discharge status: Nursing Home (Includes ICF) | Payer: Managed Care, Other (non HMO) | Source: Home / Self Care | Attending: Family Medicine | Admitting: Family Medicine

## 2014-07-20 DIAGNOSIS — J45909 Unspecified asthma, uncomplicated: Principal | ICD-10-CM | POA: Diagnosis present

## 2014-07-20 DIAGNOSIS — Z87440 Personal history of urinary (tract) infections: Secondary | ICD-10-CM | POA: Diagnosis not present

## 2014-07-20 DIAGNOSIS — G4733 Obstructive sleep apnea (adult) (pediatric): Secondary | ICD-10-CM | POA: Diagnosis not present

## 2014-07-20 DIAGNOSIS — J4532 Mild persistent asthma with status asthmaticus: Secondary | ICD-10-CM

## 2014-07-20 DIAGNOSIS — J069 Acute upper respiratory infection, unspecified: Secondary | ICD-10-CM

## 2014-07-20 DIAGNOSIS — M1990 Unspecified osteoarthritis, unspecified site: Secondary | ICD-10-CM | POA: Insufficient documentation

## 2014-07-20 DIAGNOSIS — G2581 Restless legs syndrome: Secondary | ICD-10-CM | POA: Diagnosis not present

## 2014-07-20 DIAGNOSIS — E1165 Type 2 diabetes mellitus with hyperglycemia: Secondary | ICD-10-CM | POA: Diagnosis not present

## 2014-07-20 DIAGNOSIS — E119 Type 2 diabetes mellitus without complications: Secondary | ICD-10-CM | POA: Diagnosis not present

## 2014-07-20 DIAGNOSIS — Z8781 Personal history of (healed) traumatic fracture: Secondary | ICD-10-CM | POA: Insufficient documentation

## 2014-07-20 DIAGNOSIS — R05 Cough: Secondary | ICD-10-CM

## 2014-07-20 DIAGNOSIS — E669 Obesity, unspecified: Secondary | ICD-10-CM | POA: Insufficient documentation

## 2014-07-20 DIAGNOSIS — J40 Bronchitis, not specified as acute or chronic: Secondary | ICD-10-CM | POA: Diagnosis present

## 2014-07-20 DIAGNOSIS — Z87442 Personal history of urinary calculi: Secondary | ICD-10-CM | POA: Diagnosis not present

## 2014-07-20 DIAGNOSIS — N179 Acute kidney failure, unspecified: Secondary | ICD-10-CM

## 2014-07-20 DIAGNOSIS — R011 Cardiac murmur, unspecified: Secondary | ICD-10-CM | POA: Insufficient documentation

## 2014-07-20 DIAGNOSIS — E785 Hyperlipidemia, unspecified: Secondary | ICD-10-CM | POA: Diagnosis present

## 2014-07-20 DIAGNOSIS — I129 Hypertensive chronic kidney disease with stage 1 through stage 4 chronic kidney disease, or unspecified chronic kidney disease: Secondary | ICD-10-CM | POA: Diagnosis not present

## 2014-07-20 DIAGNOSIS — E039 Hypothyroidism, unspecified: Secondary | ICD-10-CM | POA: Diagnosis not present

## 2014-07-20 DIAGNOSIS — Z794 Long term (current) use of insulin: Secondary | ICD-10-CM | POA: Insufficient documentation

## 2014-07-20 DIAGNOSIS — E780 Pure hypercholesterolemia: Secondary | ICD-10-CM | POA: Diagnosis not present

## 2014-07-20 DIAGNOSIS — Z79899 Other long term (current) drug therapy: Secondary | ICD-10-CM | POA: Insufficient documentation

## 2014-07-20 DIAGNOSIS — N189 Chronic kidney disease, unspecified: Secondary | ICD-10-CM | POA: Diagnosis not present

## 2014-07-20 DIAGNOSIS — G43909 Migraine, unspecified, not intractable, without status migrainosus: Secondary | ICD-10-CM | POA: Diagnosis not present

## 2014-07-20 DIAGNOSIS — Z872 Personal history of diseases of the skin and subcutaneous tissue: Secondary | ICD-10-CM | POA: Diagnosis not present

## 2014-07-20 DIAGNOSIS — E0590 Thyrotoxicosis, unspecified without thyrotoxic crisis or storm: Secondary | ICD-10-CM | POA: Diagnosis present

## 2014-07-20 DIAGNOSIS — I10 Essential (primary) hypertension: Secondary | ICD-10-CM | POA: Diagnosis present

## 2014-07-20 DIAGNOSIS — Z9981 Dependence on supplemental oxygen: Secondary | ICD-10-CM | POA: Diagnosis not present

## 2014-07-20 DIAGNOSIS — R059 Cough, unspecified: Secondary | ICD-10-CM

## 2014-07-20 DIAGNOSIS — Z7982 Long term (current) use of aspirin: Secondary | ICD-10-CM | POA: Diagnosis not present

## 2014-07-20 DIAGNOSIS — D649 Anemia, unspecified: Secondary | ICD-10-CM | POA: Diagnosis not present

## 2014-07-20 LAB — POCT URINALYSIS DIP (DEVICE)
Bilirubin Urine: NEGATIVE
Glucose, UA: 100 mg/dL — AB
Ketones, ur: NEGATIVE mg/dL
Nitrite: NEGATIVE
Protein, ur: NEGATIVE mg/dL
Specific Gravity, Urine: 1.015 (ref 1.005–1.030)
Urobilinogen, UA: 0.2 mg/dL (ref 0.0–1.0)
pH: 5.5 (ref 5.0–8.0)

## 2014-07-20 LAB — POCT I-STAT, CHEM 8
BUN: 30 mg/dL — ABNORMAL HIGH (ref 6–23)
Calcium, Ion: 1.17 mmol/L (ref 1.13–1.30)
Chloride: 102 mmol/L (ref 96–112)
Creatinine, Ser: 1.5 mg/dL — ABNORMAL HIGH (ref 0.50–1.10)
Glucose, Bld: 248 mg/dL — ABNORMAL HIGH (ref 70–99)
HCT: 36 % (ref 36.0–46.0)
Hemoglobin: 12.2 g/dL (ref 12.0–15.0)
Potassium: 3.4 mmol/L — ABNORMAL LOW (ref 3.5–5.1)
Sodium: 140 mmol/L (ref 135–145)
TCO2: 23 mmol/L (ref 0–100)

## 2014-07-20 LAB — CBC WITH DIFFERENTIAL/PLATELET
Basophils Absolute: 0 10*3/uL (ref 0.0–0.1)
Basophils Relative: 0 % (ref 0–1)
Eosinophils Absolute: 0 10*3/uL (ref 0.0–0.7)
Eosinophils Relative: 0 % (ref 0–5)
HCT: 34.8 % — ABNORMAL LOW (ref 36.0–46.0)
Hemoglobin: 11.5 g/dL — ABNORMAL LOW (ref 12.0–15.0)
Lymphocytes Relative: 12 % (ref 12–46)
Lymphs Abs: 0.4 10*3/uL — ABNORMAL LOW (ref 0.7–4.0)
MCH: 28.8 pg (ref 26.0–34.0)
MCHC: 33 g/dL (ref 30.0–36.0)
MCV: 87.2 fL (ref 78.0–100.0)
Monocytes Absolute: 0.1 10*3/uL (ref 0.1–1.0)
Monocytes Relative: 3 % (ref 3–12)
Neutro Abs: 2.7 10*3/uL (ref 1.7–7.7)
Neutrophils Relative %: 85 % — ABNORMAL HIGH (ref 43–77)
Platelets: 143 10*3/uL — ABNORMAL LOW (ref 150–400)
RBC: 3.99 MIL/uL (ref 3.87–5.11)
RDW: 13.2 % (ref 11.5–15.5)
WBC: 3.2 10*3/uL — ABNORMAL LOW (ref 4.0–10.5)

## 2014-07-20 LAB — BASIC METABOLIC PANEL
Anion gap: 14 (ref 5–15)
BUN: 27 mg/dL — ABNORMAL HIGH (ref 6–23)
CO2: 23 mmol/L (ref 19–32)
Calcium: 9.5 mg/dL (ref 8.4–10.5)
Chloride: 101 mmol/L (ref 96–112)
Creatinine, Ser: 1.49 mg/dL — ABNORMAL HIGH (ref 0.50–1.10)
GFR calc Af Amer: 42 mL/min — ABNORMAL LOW (ref >90)
GFR calc non Af Amer: 36 mL/min — ABNORMAL LOW (ref >90)
Glucose, Bld: 261 mg/dL — ABNORMAL HIGH (ref 70–99)
Potassium: 3.5 mmol/L (ref 3.5–5.1)
Sodium: 138 mmol/L (ref 135–145)

## 2014-07-20 MED ORDER — ALBUTEROL (5 MG/ML) CONTINUOUS INHALATION SOLN
10.0000 mg/h | INHALATION_SOLUTION | Freq: Once | RESPIRATORY_TRACT | Status: AC
  Administered 2014-07-20: 10 mg/h via RESPIRATORY_TRACT
  Filled 2014-07-20: qty 20

## 2014-07-20 MED ORDER — IPRATROPIUM BROMIDE 0.02 % IN SOLN
0.5000 mg | Freq: Once | RESPIRATORY_TRACT | Status: AC
  Administered 2014-07-20: 0.5 mg via RESPIRATORY_TRACT
  Filled 2014-07-20: qty 2.5

## 2014-07-20 MED ORDER — IPRATROPIUM BROMIDE 0.02 % IN SOLN
0.5000 mg | Freq: Once | RESPIRATORY_TRACT | Status: AC
  Administered 2014-07-20: 0.5 mg via RESPIRATORY_TRACT

## 2014-07-20 MED ORDER — IPRATROPIUM-ALBUTEROL 0.5-2.5 (3) MG/3ML IN SOLN
RESPIRATORY_TRACT | Status: AC
  Filled 2014-07-20: qty 3

## 2014-07-20 MED ORDER — ALBUTEROL SULFATE (2.5 MG/3ML) 0.083% IN NEBU
INHALATION_SOLUTION | RESPIRATORY_TRACT | Status: AC
  Filled 2014-07-20: qty 3

## 2014-07-20 MED ORDER — ALBUTEROL SULFATE (2.5 MG/3ML) 0.083% IN NEBU
5.0000 mg | INHALATION_SOLUTION | Freq: Once | RESPIRATORY_TRACT | Status: AC
  Administered 2014-07-20: 5 mg via RESPIRATORY_TRACT

## 2014-07-20 MED ORDER — ALBUTEROL SULFATE (2.5 MG/3ML) 0.083% IN NEBU: 10.0000 mg | INHALATION_SOLUTION | Freq: Once | RESPIRATORY_TRACT | Status: DC

## 2014-07-20 MED ORDER — SODIUM CHLORIDE 0.9 % IV BOLUS (SEPSIS)
1000.0000 mL | Freq: Once | INTRAVENOUS | Status: AC
Start: 2014-07-20 — End: 2014-07-21
  Administered 2014-07-20: 1000 mL via INTRAVENOUS

## 2014-07-20 MED ORDER — ALBUTEROL SULFATE (2.5 MG/3ML) 0.083% IN NEBU
INHALATION_SOLUTION | RESPIRATORY_TRACT | Status: AC
  Filled 2014-07-20: qty 6

## 2014-07-20 MED ORDER — PREDNISONE 20 MG PO TABS
40.0000 mg | ORAL_TABLET | Freq: Once | ORAL | Status: AC
  Administered 2014-07-20: 40 mg via ORAL

## 2014-07-20 MED ORDER — PREDNISONE 20 MG PO TABS
ORAL_TABLET | ORAL | Status: AC
  Filled 2014-07-20: qty 2

## 2014-07-20 NOTE — ED Notes (Signed)
Pt ambulated in the hallway, pt got a little short of breath, pt o2 96, heart rate 120. MD aware.

## 2014-07-20 NOTE — ED Notes (Addendum)
Reports a week long history of uri symptoms:  Cough, "rattling" in chest.  "feels out of it", tired and sleepy.  Denies fever, but feels achy.  Initially, sinus congestion was a problem

## 2014-07-20 NOTE — ED Notes (Signed)
Pt c/o cough x 1 week. Pt sent from urgent care for further eval of no changes after given 2 neb treatments and prednisone.

## 2014-07-20 NOTE — ED Provider Notes (Signed)
CSN: [id number]     Arrival date & time 07/20/14  1747 History   First MD Initiated Contact with Patient 07/20/14 1959     Chief Complaint  Patient presents with  . Cough     (Consider location/radiation/quality/duration/timing/severity/associated sxs/prior Treatment) The history is provided by the patient.  [name] is a 64 y.o. female hx of DM, HL, here presenting with cough, shortness of breath. Symptoms intermittent for the last week or so but got worse today. She called her primary care doctor and went to urgent care and was sent here after given 2 nebs, steroids with minimal improvement. She had a chest x-ray there that showed no pneumonia and creatinine 1.5 with baseline 0.6. Hasn't been eating or drinking much but denies any vomiting.    Past Medical History  Diagnosis Date  . Urticaria   . Fibromyalgia   . Tachycardia   . DM (diabetes mellitus)   . Hyperlipidemia   . Bell's palsy   . Hypertension   . Hypothyroidism   . Obesity   . UTI (urinary tract infection)   . Nephrolithiasis   . Anemia   . Carbuncle and furuncle of trunk   . Fracture MAY 2014    HX OF FRACTURED NECK C1- CAUSES SEVERE HEADACHES--LIMITED ROM NECK  . Disorder of fascia     HX OF NECROTIC FASCITIS AFTER ABDOMINAL SURGERY FOR HERNIA- REQUIRED 19 SURGERIES AND 2.5 MONTH HOSPITALIZATION AT [facility]  . Pain     LEFT SHOULDER  PAIN -HARD TO LIE ON LEFT SIDE FOR LONG PERIOD;  PAIN IN LOWER BADK - 3 HERNIATED DISCS AND STENOISIS -  . Elevated cholesterol   . Heart murmur     DOES NOT CAUSE ANY PROBLEMS  . Dysrhythmia     HX OF TACHYCARDIA AND BRADYCARDIA - ON GOING FOR YEARS - DOES NOT HAVE TO SEE CARDIOLOGIST  . Asthma     OCCAS  . Migraine   . Arthritis   . Urinary frequency   . Restless leg syndrome   . OSA (obstructive sleep apnea)     USES CPAP - DOES NOT KNOW SETTING  . Frequent infections     ESPECIALLY PRONE TO INFECTIONS AFTER SURGERIES  . PONV (postoperative nausea and vomiting)      THE GAS MAKES ME NAUSEATED   Past Surgical History  Procedure Laterality Date  . Appendectomy    . Abdominal hysterectomy    . Cholecystectomy    . Cesarean section      X 3  . Carpal tunnel release      BILATERAL  . Knee arthroscopy  RIGHT AND LEFT    X 2  . Shoulder arthroscopy Right     X 2  . Laparoscopic gastric sleeve resection  2014  . Hernia repair      ABDOMINAL HERNIA REPAIR WITH MESH - 3 SURGERIES   . Joint replacement      TOTAL RIGHT KNEE REPLACEMENT  . Right foot drainage of infection    . I&d of infected site in belly - from an injection    . Tonsillectomy      AND ADENOIDECTOMY  . Hx of 19 surgeries for necrotic fascitis    . Cystoscopy with ureteroscopy Right 12/03/2013    Procedure: CYSTOSCOPY WITH RIGHT RETROGRADE URETEROSCOPY LASER LITHOTRIPSY RIGHT STONE RIGHT URETERAL STENT, ;  Surgeon: Raynelle Bring, MD;  Location: [facility];  Service: Urology;  Laterality: Right;  PROCEDURE WAS ORIGINALLY SCHEDULED AS RIGHT PERCUTANEOUS NEPHROLITHOTOMY  .  Nephrolithotomy Right 12/10/2013    Procedure: NEPHROLITHOTOMY PERCUTANEOUS;  Surgeon: Raynelle Bring, MD;  Location: [facility];  Service: Urology;  Laterality: Right;   Family History  Problem Relation Age of Onset  . Diabetes Father   . Osteoarthritis Father   . Heart disease Father   . Ulcers Father   . Stroke Mother    History  Substance Use Topics  . Smoking status: Never Smoker   . Smokeless tobacco: Never Used  . Alcohol Use: No   OB History    No data available     Review of Systems  Respiratory: Positive for cough.   All other systems reviewed and are negative.     Allergies  Codeine; Imitrex; Morphine; Nsaids; Promethazine hcl; Statins; Sulfonamide derivatives; and Vicodin  Home Medications   Prior to Admission medications   Medication Sig Start Date End Date Taking? Authorizing Provider  albuterol (PROVENTIL HFA;VENTOLIN HFA) 108 (90 BASE) MCG/ACT inhaler Inhale 2 puffs into the lungs every 6  (six) hours as needed for wheezing or shortness of breath. PROAIR   Yes Historical Provider, MD  amoxicillin (AMOXIL) 500 MG capsule Take 2,000 mg by mouth See admin instructions. Take 4 capsules (2000 mg) by mouth 1 hour prior to dental appointment 06/12/14  Yes Historical Provider, MD  aspirin EC 81 MG tablet Take 81 mg by mouth at bedtime.   Yes Historical Provider, MD  b complex vitamins tablet Take 1 tablet by mouth daily.   Yes Historical Provider, MD  cephALEXin (KEFLEX) 250 MG capsule Take 125 mg by mouth at bedtime. Prophylaxis UTI infections   Yes Historical Provider, MD  cetirizine (ZYRTEC) 10 MG tablet Take 10 mg by mouth See admin instructions. Take 1 tablet (10 mg) twice daily during allergy season   Yes Historical Provider, MD  Chlorpheniramine Maleate (CHLOR-TRIMETON PO) Take 1 tablet by mouth See admin instructions. Take 1 tablet four times daily during allergy season   Yes Historical Provider, MD  clonazePAM (KLONOPIN) 0.5 MG tablet Take 0.5 mg by mouth 3 (three) times daily.  07/01/14  Yes Historical Provider, MD  cyclobenzaprine (FLEXERIL) 10 MG tablet Take 10 mg by mouth See admin instructions. Take 1 tablet (10 mg) every night at bedtime, may take an additional tablet during the day as needed for muscle spasms 07/15/14  Yes Historical Provider, MD  dextromethorphan-guaiFENesin (MUCINEX DM) 30-600 MG per 12 hr tablet Take 1 tablet by mouth 2 (two) times daily as needed for cough (congestion).   Yes Historical Provider, MD  diclofenac sodium (VOLTAREN) 1 % GEL Apply 1 application topically 2 (two) times daily as needed (arthritis pain - hands).   Yes Historical Provider, MD  erythromycin ophthalmic ointment Place 1 application into both eyes 2 (two) times daily. Started 07/18/14 for pink eye 07/18/14  Yes Historical Provider, MD  fenofibrate 160 MG tablet Take 160 mg by mouth daily with supper.    Yes Historical Provider, MD  ferrous sulfate 325 (65 FE) MG tablet Take 325 mg by mouth  daily with breakfast.   Yes Historical Provider, MD  FLUoxetine (PROZAC) 40 MG capsule Take 40 mg by mouth at bedtime.  07/14/14  Yes Historical Provider, MD  Fluticasone Propionate (FLONASE ALLERGY RELIEF NA) Place 2 sprays into both nostrils See admin instructions. Use 2 sprays each nare daily at bedtime during allergy season   Yes Historical Provider, MD  gabapentin (NEURONTIN) 100 MG capsule Take 100 mg by mouth 3 (three) times daily.   Yes Historical Provider,  MD  hydrochlorothiazide (MICROZIDE) 12.5 MG capsule Take 12.5 mg by mouth daily.  07/01/14  Yes Historical Provider, MD  ibuprofen (ADVIL,MOTRIN) 200 MG tablet Take 800 mg by mouth daily as needed (pain).   Yes Historical Provider, MD  insulin aspart (NOVOLOG) 100 UNIT/ML injection Inject 5-25 Units into the skin 3 (three) times daily as needed for high blood sugar (CBG >120). Per sliding scale   Yes Historical Provider, MD  insulin detemir (LEVEMIR) 100 UNIT/ML injection Inject 20-25 Units into the skin 2 (two) times daily. Based on CBG level   Yes Historical Provider, MD  levothyroxine (SYNTHROID, LEVOTHROID) 75 MCG tablet Take 75 mcg by mouth See admin instructions. Take 1 tablet (75 mcg) every morning before breakfast except Mondays (Do not take on Mondays)   Yes Historical Provider, MD  metoprolol (LOPRESSOR) 50 MG tablet Take 50 mg by mouth 3 (three) times daily.  06/28/14  Yes Historical Provider, MD  mometasone (ASMANEX) 220 MCG/INH inhaler Inhale 1 puff into the lungs 2 (two) times daily.   Yes Historical Provider, MD  montelukast (SINGULAIR) 10 MG tablet Take 10 mg by mouth daily.    Yes Historical Provider, MD  Multiple Vitamins-Minerals (HAIR/SKIN/NAILS/BIOTIN) TABS Take 1 tablet by mouth daily.   Yes Historical Provider, MD  omeprazole (PRILOSEC) 20 MG capsule Take 20 mg by mouth daily with supper.    Yes Historical Provider, MD  ondansetron (ZOFRAN-ODT) 4 MG disintegrating tablet Take 4 mg by mouth daily as needed (nausea from pain  medication).  06/03/14  Yes Historical Provider, MD  oxyCODONE-acetaminophen (ROXICET) 5-325 MG per tablet Take 1 tablet by mouth every 4 (four) hours as needed for severe pain. Patient taking differently: Take 1 tablet by mouth daily as needed for severe pain.  12/10/13  Yes Raynelle Bring, MD  Phentermine-Topiramate 11.25-69 MG CP24 Take 1 tablet by mouth daily.   Yes Historical Provider, MD  potassium citrate (UROCIT-K) 10 MEQ (1080 MG) SR tablet Take 10 mEq by mouth 4 (four) times daily.  06/17/14  Yes Historical Provider, MD  Probiotic Product (PROBIOTIC DAILY PO) Take 1 capsule by mouth daily.    Yes Historical Provider, MD  rOPINIRole (REQUIP) 2 MG tablet Take 2 mg by mouth 4 (four) times daily.    Yes Historical Provider, MD  zolpidem (AMBIEN) 5 MG tablet Take 5 mg by mouth at bedtime as needed for sleep.  06/12/14  Yes Historical Provider, MD  ondansetron (ZOFRAN) 4 MG tablet Take 1 tablet (4 mg total) by mouth every 8 (eight) hours as needed for nausea or vomiting. Patient not taking: Reported on 07/20/2014 12/03/13   Raynelle Bring, MD  ondansetron (ZOFRAN) 4 MG tablet Take 1 tablet (4 mg total) by mouth every 8 (eight) hours as needed for nausea or vomiting. Patient not taking: Reported on 07/20/2014 12/10/13   Raynelle Bring, MD  ondansetron (ZOFRAN) 4 MG tablet Take 1 tablet (4 mg total) by mouth every 8 (eight) hours as needed for nausea or vomiting. Patient not taking: Reported on 07/20/2014 12/10/13   Raynelle Bring, MD   BP 125/56 mmHg  Pulse 82  Temp(Src) 97.5 F (36.4 C) (Oral)  Resp 18  Ht 5\' 1"  (1.549 m)  Wt 180 lb (81.647 kg)  BMI 34.03 kg/m2  SpO2 100% Physical Exam  Constitutional: She is oriented to person, place, and time.  Coughing   HENT:  Head: Normocephalic.  MM slightly dry   Eyes: Conjunctivae are normal. Pupils are equal, round, and reactive to light.  Neck: Normal range of motion. Neck supple.  Cardiovascular: Normal rate, regular rhythm and normal heart sounds.    Pulmonary/Chest:  Slightly tachypneic, mild diffuse wheezing. No retractions   Abdominal: Soft. Bowel sounds are normal. She exhibits no distension. There is no tenderness. There is no rebound.  Musculoskeletal: Normal range of motion. She exhibits no edema or tenderness.  Neurological: She is alert and oriented to person, place, and time.  Skin: Skin is warm and dry.  Psychiatric: She has a normal mood and affect. Her behavior is normal. Judgment and thought content normal.  Nursing note and vitals reviewed.   ED Course  Procedures (including critical care time) Labs Review Labs Reviewed  CBC WITH DIFFERENTIAL/PLATELET - Abnormal; Notable for the following:    WBC 3.2 (*)    Hemoglobin 11.5 (*)    HCT 34.8 (*)    Platelets 143 (*)    Neutrophils Relative % 85 (*)    Lymphs Abs 0.4 (*)    All other components within normal limits  BASIC METABOLIC PANEL - Abnormal; Notable for the following:    Glucose, Bld 261 (*)    BUN 27 (*)    Creatinine, Ser 1.49 (*)    GFR calc non Af Amer 36 (*)    GFR calc Af Amer 42 (*)    All other components within normal limits    Imaging Review Dg Chest 2 View  07/20/2014   CLINICAL DATA:  Cough x1 week  EXAM: CHEST  2 VIEW  COMPARISON:  11/27/2013  FINDINGS: Lungs are clear.  No pleural effusion or pneumothorax.  The heart is top-normal in size.  Degenerative changes of the visualized thoracolumbar spine.  Cholecystectomy clips.  IMPRESSION: No evidence of acute cardiopulmonary disease.   Electronically Signed   By: Julian Hy M.D.   On: 07/20/2014 15:54     EKG Interpretation None      MDM   Final diagnoses:  None   [name] is a 64 y.o. female here with cough, renal insufficiency. CXR clear, will give another neb. Not hypoxic. Will hydrate patient as well.   12:03 AM Persistently short of breath after neb. Still wheezing. Gets tachy to 120s when ambulating and only walked several steps. O2 94-95%, will admit for obs.      Wandra Arthurs, MD 07/21/14 [id number]

## 2014-07-20 NOTE — ED Provider Notes (Signed)
CSN: [id number]     Arrival date & time 07/20/14  1332 History   First MD Initiated Contact with Patient 07/20/14 1446     Chief Complaint  Patient presents with  . URI   (Consider location/radiation/quality/duration/timing/severity/associated sxs/prior Treatment) HPI      64 year old female presents for evaluation of cough, wheezing, shortness of breath. Her symptoms started one week ago. She has been using her over-the-counter asthma medications with minimal relief. She has also felt lightheaded for the past 2 days. She denies fever, chills, NVD, chest pain. She does for mildly short of breath on exertion. She has diabetes, her blood sugar has been normal. Her last A1c was 7.0, she uses insulin.  Past Medical History  Diagnosis Date  . Urticaria   . Fibromyalgia   . Tachycardia   . DM (diabetes mellitus)   . Hyperlipidemia   . Bell's palsy   . Hypertension   . Hypothyroidism   . Obesity   . UTI (urinary tract infection)   . Nephrolithiasis   . Anemia   . Carbuncle and furuncle of trunk   . Fracture MAY 2014    HX OF FRACTURED NECK C1- CAUSES SEVERE HEADACHES--LIMITED ROM NECK  . Disorder of fascia     HX OF NECROTIC FASCITIS AFTER ABDOMINAL SURGERY FOR HERNIA- REQUIRED 19 SURGERIES AND 2.5 MONTH HOSPITALIZATION AT [facility]  . Pain     LEFT SHOULDER  PAIN -HARD TO LIE ON LEFT SIDE FOR LONG PERIOD;  PAIN IN LOWER BADK - 3 HERNIATED DISCS AND STENOISIS -  . Elevated cholesterol   . Heart murmur     DOES NOT CAUSE ANY PROBLEMS  . Dysrhythmia     HX OF TACHYCARDIA AND BRADYCARDIA - ON GOING FOR YEARS - DOES NOT HAVE TO SEE CARDIOLOGIST  . Asthma     OCCAS  . Migraine   . Arthritis   . Urinary frequency   . Restless leg syndrome   . OSA (obstructive sleep apnea)     USES CPAP - DOES NOT KNOW SETTING  . Frequent infections     ESPECIALLY PRONE TO INFECTIONS AFTER SURGERIES  . PONV (postoperative nausea and vomiting)     THE GAS MAKES ME NAUSEATED   Past Surgical History   Procedure Laterality Date  . Appendectomy    . Abdominal hysterectomy    . Cholecystectomy    . Cesarean section      X 3  . Carpal tunnel release      BILATERAL  . Knee arthroscopy  RIGHT AND LEFT    X 2  . Shoulder arthroscopy Right     X 2  . Laparoscopic gastric sleeve resection  2014  . Hernia repair      ABDOMINAL HERNIA REPAIR WITH MESH - 3 SURGERIES   . Joint replacement      TOTAL RIGHT KNEE REPLACEMENT  . Right foot drainage of infection    . I&d of infected site in belly - from an injection    . Tonsillectomy      AND ADENOIDECTOMY  . Hx of 19 surgeries for necrotic fascitis    . Cystoscopy with ureteroscopy Right 12/03/2013    Procedure: CYSTOSCOPY WITH RIGHT RETROGRADE URETEROSCOPY LASER LITHOTRIPSY RIGHT STONE RIGHT URETERAL STENT, ;  Surgeon: Raynelle Bring, MD;  Location: [facility];  Service: Urology;  Laterality: Right;  PROCEDURE WAS ORIGINALLY SCHEDULED AS RIGHT PERCUTANEOUS NEPHROLITHOTOMY  . Nephrolithotomy Right 12/10/2013    Procedure: NEPHROLITHOTOMY PERCUTANEOUS;  Surgeon: Raynelle Bring,  MD;  Location: [facility];  Service: Urology;  Laterality: Right;   Family History  Problem Relation Age of Onset  . Diabetes Father   . Osteoarthritis Father   . Heart disease Father   . Ulcers Father   . Stroke Mother    History  Substance Use Topics  . Smoking status: Never Smoker   . Smokeless tobacco: Never Used  . Alcohol Use: No   OB History    No data available     Review of Systems  Constitutional: Negative for fever and chills.  HENT: Positive for congestion. Negative for ear pain and sinus pressure.   Respiratory: Positive for cough, chest tightness, shortness of breath and wheezing.   Cardiovascular: Negative for chest pain.  Neurological: Positive for light-headedness.  All other systems reviewed and are negative.   Allergies  Codeine; Imitrex; Morphine; Nsaids; Promethazine hcl; Sulfonamide derivatives; and Vicodin  Home Medications   Prior to  Admission medications   Medication Sig Start Date End Date Taking? Authorizing Provider  ciprofloxacin (CIPRO) 500 MG tablet Take 500 mg by mouth 2 (two) times daily. Pt states she is take cipro the 3 days before her surgery    Historical Provider, MD  fenofibrate 160 MG tablet Take 160 mg by mouth every evening.     Historical Provider, MD  FLUoxetine (PROZAC) 20 MG capsule Take 20 mg by mouth at bedtime.     Historical Provider, MD  gabapentin (NEURONTIN) 100 MG capsule Take 100 mg by mouth 3 (three) times daily.    Historical Provider, MD  guaiFENesin (MUCINEX) 600 MG 12 hr tablet Take 1,200 mg by mouth 2 (two) times daily.    Historical Provider, MD  insulin aspart (NOVOLOG) 100 UNIT/ML injection Inject 5 Units into the skin once as needed for high blood sugar. Per sliding scale    Historical Provider, MD  insulin detemir (LEVEMIR) 100 UNIT/ML injection Inject 15 Units into the skin 2 (two) times daily.     Historical Provider, MD  IRON PO Take 1 tablet by mouth daily.    Historical Provider, MD  levothyroxine (SYNTHROID, LEVOTHROID) 75 MCG tablet Take 75 mcg by mouth See admin instructions. Mondays takes one tablet (14mcg). Rest of week takes half of a pill. Takes in the morning.    Historical Provider, MD  LORazepam (ATIVAN) 0.5 MG tablet Take 0.5 mg by mouth 3 (three) times daily as needed for anxiety.    Historical Provider, MD  Magnesium 250 MG TABS Take 1 tablet by mouth 4 (four) times daily.    Historical Provider, MD  montelukast (SINGULAIR) 10 MG tablet Take 10 mg by mouth daily as needed (Aching/tightness in chest.).     Historical Provider, MD  omeprazole (PRILOSEC) 20 MG capsule Take 20 mg by mouth every evening.    Historical Provider, MD  ondansetron (ZOFRAN) 4 MG tablet Take 1 tablet (4 mg total) by mouth every 8 (eight) hours as needed for nausea or vomiting. 12/03/13   Raynelle Bring, MD  ondansetron (ZOFRAN) 4 MG tablet Take 1 tablet (4 mg total) by mouth every 8 (eight) hours as  needed for nausea or vomiting. 12/10/13   Raynelle Bring, MD  ondansetron (ZOFRAN) 4 MG tablet Take 1 tablet (4 mg total) by mouth every 8 (eight) hours as needed for nausea or vomiting. 12/10/13   Raynelle Bring, MD  oxyCODONE-acetaminophen (PERCOCET/ROXICET) 5-325 MG per tablet Take 1 tablet by mouth every 4 (four) hours as needed for moderate pain or severe  pain.    Historical Provider, MD  oxyCODONE-acetaminophen (ROXICET) 5-325 MG per tablet Take 1 tablet by mouth every 4 (four) hours as needed for severe pain. 12/10/13   Raynelle Bring, MD  Probiotic Product (PROBIOTIC DAILY PO) Take 1-2 capsules by mouth daily.    Historical Provider, MD  rOPINIRole (REQUIP) 2 MG tablet Take 2 mg by mouth 4 (four) times daily.     Historical Provider, MD   BP 123/71 mmHg  Pulse 94  Temp(Src) 98.1 F (36.7 C) (Oral)  Resp 16  SpO2 98% Physical Exam  Constitutional: She is oriented to person, place, and time. Vital signs are normal. She appears well-developed and well-nourished. She appears distressed (appears moderately uncomfortable).  HENT:  Head: Normocephalic and atraumatic.  Right Ear: External ear normal.  Left Ear: External ear normal.  Nose: Nose normal.  Mouth/Throat: Oropharynx is clear and moist. No oropharyngeal exudate.  Eyes: Conjunctivae are normal.  Neck: Normal range of motion. Neck supple.  Cardiovascular: Normal rate, regular rhythm and normal heart sounds.   Pulmonary/Chest: Effort normal. No respiratory distress. She has wheezes (diffuse, expiratory, with prolonged expiratory component). She has rhonchi. She has no rales. She exhibits no tenderness.  Lymphadenopathy:    She has no cervical adenopathy.  Neurological: She is alert and oriented to person, place, and time. She has normal strength. Coordination normal.  Skin: Skin is warm and dry. No rash noted. She is not diaphoretic.  Psychiatric: She has a normal mood and affect. Judgment normal.  Nursing note and vitals  reviewed.   ED Course  Procedures (including critical care time) Labs Review Labs Reviewed  POCT I-STAT, CHEM 8 - Abnormal; Notable for the following:    Potassium 3.4 (*)    BUN 30 (*)    Creatinine, Ser 1.50 (*)    Glucose, Bld 248 (*)    All other components within normal limits  POCT URINALYSIS DIP (DEVICE) - Abnormal; Notable for the following:    Glucose, UA 100 (*)    Hgb urine dipstick TRACE (*)    Leukocytes, UA LARGE (*)    All other components within normal limits    Imaging Review Dg Chest 2 View  07/20/2014   CLINICAL DATA:  Cough x1 week  EXAM: CHEST  2 VIEW  COMPARISON:  11/27/2013  FINDINGS: Lungs are clear.  No pleural effusion or pneumothorax.  The heart is top-normal in size.  Degenerative changes of the visualized thoracolumbar spine.  Cholecystectomy clips.  IMPRESSION: No evidence of acute cardiopulmonary disease.   Electronically Signed   By: Julian Hy M.D.   On: 07/20/2014 15:54     MDM   1. URI (upper respiratory infection)   2. Cough   3. Status asthmaticus, mild persistent   4. Hyperglycemia due to type 2 diabetes mellitus   5. AKI (acute kidney injury)    After 2 breathing treatments, wheezing is not better and she still feels somewhat short of breath. She has a 50% increase in her baseline serum creatinine, BUN increased at 30, hyperglycemic at 250. Case discussed with attending. She'll be transferred to the emergency department via shuttle for further evaluation and management  Meds ordered this encounter  Medications  . albuterol (PROVENTIL) (2.5 MG/3ML) 0.083% nebulizer solution 5 mg    Sig:   . ipratropium (ATROVENT) nebulizer solution 0.5 mg    Sig:   . albuterol (PROVENTIL) (2.5 MG/3ML) 0.083% nebulizer solution 5 mg    Sig:   . ipratropium (ATROVENT)  nebulizer solution 0.5 mg    Sig:   . predniSONE (DELTASONE) tablet 40 mg    Sig:        Liam Graham, PA-C 07/20/14 1726

## 2014-07-20 NOTE — ED Notes (Signed)
1 

## 2014-07-20 NOTE — ED Notes (Signed)
1 blue, , light green, and 2 lavender tops sent to lab.  Dark green sent to mini lab to hold.

## 2014-07-21 ENCOUNTER — Encounter (HOSPITAL_COMMUNITY): Payer: Self-pay | Admitting: General Practice

## 2014-07-21 DIAGNOSIS — E118 Type 2 diabetes mellitus with unspecified complications: Secondary | ICD-10-CM | POA: Diagnosis not present

## 2014-07-21 DIAGNOSIS — I10 Essential (primary) hypertension: Secondary | ICD-10-CM

## 2014-07-21 DIAGNOSIS — E039 Hypothyroidism, unspecified: Secondary | ICD-10-CM

## 2014-07-21 DIAGNOSIS — J45909 Unspecified asthma, uncomplicated: Secondary | ICD-10-CM | POA: Diagnosis present

## 2014-07-21 DIAGNOSIS — J40 Bronchitis, not specified as acute or chronic: Secondary | ICD-10-CM | POA: Diagnosis present

## 2014-07-21 DIAGNOSIS — J4541 Moderate persistent asthma with (acute) exacerbation: Secondary | ICD-10-CM

## 2014-07-21 DIAGNOSIS — N179 Acute kidney failure, unspecified: Secondary | ICD-10-CM | POA: Diagnosis present

## 2014-07-21 LAB — GLUCOSE, CAPILLARY
Glucose-Capillary: 434 mg/dL — ABNORMAL HIGH (ref 70–99)
Glucose-Capillary: 215 mg/dL — ABNORMAL HIGH (ref 70–99)
Glucose-Capillary: 420 mg/dL — ABNORMAL HIGH (ref 70–99)

## 2014-07-21 LAB — CBC
HCT: 33.3 % — ABNORMAL LOW (ref 36.0–46.0)
Hemoglobin: 10.9 g/dL — ABNORMAL LOW (ref 12.0–15.0)
MCH: 28.9 pg (ref 26.0–34.0)
MCHC: 32.7 g/dL (ref 30.0–36.0)
MCV: 88.3 fL (ref 78.0–100.0)
Platelets: 144 10*3/uL — ABNORMAL LOW (ref 150–400)
RBC: 3.77 MIL/uL — ABNORMAL LOW (ref 3.87–5.11)
RDW: 13.4 % (ref 11.5–15.5)
WBC: 3.5 10*3/uL — ABNORMAL LOW (ref 4.0–10.5)

## 2014-07-21 LAB — BASIC METABOLIC PANEL
Anion gap: 10 (ref 5–15)
BUN: 25 mg/dL — ABNORMAL HIGH (ref 6–20)
CO2: 26 mmol/L (ref 22–32)
Calcium: 9.5 mg/dL (ref 8.9–10.3)
Chloride: 103 mmol/L (ref 101–111)
Creatinine, Ser: 1.32 mg/dL — ABNORMAL HIGH (ref 0.44–1.00)
GFR calc Af Amer: 49 mL/min — ABNORMAL LOW (ref >60)
GFR calc non Af Amer: 42 mL/min — ABNORMAL LOW (ref >60)
Glucose, Bld: 245 mg/dL — ABNORMAL HIGH (ref 70–99)
Potassium: 4.2 mmol/L (ref 3.5–5.1)
Sodium: 139 mmol/L (ref 135–145)

## 2014-07-21 LAB — INFLUENZA PANEL BY PCR (TYPE A & B)
H1N1 flu by pcr: NOT DETECTED
Influenza A By PCR: NEGATIVE
Influenza B By PCR: NEGATIVE

## 2014-07-21 LAB — MRSA PCR SCREENING: MRSA by PCR: NEGATIVE

## 2014-07-21 MED ORDER — LEVOTHYROXINE SODIUM 75 MCG PO TABS
75.0000 ug | ORAL_TABLET | ORAL | Status: DC
  Administered 2014-07-21: 75 ug via ORAL
  Filled 2014-07-21 (×2): qty 1

## 2014-07-21 MED ORDER — RISAQUAD PO CAPS
1.0000 | ORAL_CAPSULE | Freq: Every day | ORAL | Status: DC
  Administered 2014-07-21: 1 via ORAL
  Filled 2014-07-21: qty 1

## 2014-07-21 MED ORDER — ASPIRIN EC 81 MG PO TBEC
81.0000 mg | DELAYED_RELEASE_TABLET | Freq: Every day | ORAL | Status: DC
  Filled 2014-07-21 (×2): qty 1

## 2014-07-21 MED ORDER — IPRATROPIUM-ALBUTEROL 0.5-2.5 (3) MG/3ML IN SOLN: 3.0000 mL | RESPIRATORY_TRACT | Status: DC

## 2014-07-21 MED ORDER — GABAPENTIN 100 MG PO CAPS
100.0000 mg | ORAL_CAPSULE | Freq: Three times a day (TID) | ORAL | Status: DC
  Administered 2014-07-21 (×2): 100 mg via ORAL
  Filled 2014-07-21 (×4): qty 1

## 2014-07-21 MED ORDER — METOPROLOL TARTRATE 50 MG PO TABS
50.0000 mg | ORAL_TABLET | Freq: Three times a day (TID) | ORAL | Status: DC
  Administered 2014-07-21 (×2): 50 mg via ORAL
  Filled 2014-07-21 (×5): qty 1

## 2014-07-21 MED ORDER — SODIUM CHLORIDE 0.9 % IV SOLN: 250.0000 mL | INTRAVENOUS | Status: DC | PRN

## 2014-07-21 MED ORDER — SODIUM CHLORIDE 0.9 % IV SOLN
INTRAVENOUS | Status: AC
  Administered 2014-07-21: 03:00:00 via INTRAVENOUS

## 2014-07-21 MED ORDER — IPRATROPIUM-ALBUTEROL 0.5-2.5 (3) MG/3ML IN SOLN
3.0000 mL | Freq: Three times a day (TID) | RESPIRATORY_TRACT | Status: DC
  Administered 2014-07-21 (×2): 3 mL via RESPIRATORY_TRACT
  Filled 2014-07-21 (×2): qty 3

## 2014-07-21 MED ORDER — INSULIN ASPART 100 UNIT/ML ~~LOC~~ SOLN: 0.0000 [IU] | Freq: Every day | SUBCUTANEOUS | Status: DC

## 2014-07-21 MED ORDER — ACETAMINOPHEN 325 MG PO TABS
650.0000 mg | ORAL_TABLET | Freq: Four times a day (QID) | ORAL | Status: DC | PRN
  Administered 2014-07-21: 650 mg via ORAL
  Filled 2014-07-21: qty 2

## 2014-07-21 MED ORDER — INSULIN ASPART 100 UNIT/ML ~~LOC~~ SOLN
0.0000 [IU] | Freq: Three times a day (TID) | SUBCUTANEOUS | Status: DC
  Administered 2014-07-21: 3 [IU] via SUBCUTANEOUS

## 2014-07-21 MED ORDER — CYCLOBENZAPRINE HCL 10 MG PO TABS: 10.0000 mg | ORAL_TABLET | Freq: Two times a day (BID) | ORAL | Status: DC | PRN

## 2014-07-21 MED ORDER — IPRATROPIUM-ALBUTEROL 0.5-2.5 (3) MG/3ML IN SOLN: 3.0000 mL | Freq: Four times a day (QID) | RESPIRATORY_TRACT | Status: DC | PRN

## 2014-07-21 MED ORDER — ACETAMINOPHEN 650 MG RE SUPP: 650.0000 mg | Freq: Four times a day (QID) | RECTAL | Status: DC | PRN

## 2014-07-21 MED ORDER — ROPINIROLE HCL 1 MG PO TABS
2.0000 mg | ORAL_TABLET | Freq: Three times a day (TID) | ORAL | Status: DC
  Administered 2014-07-21 (×3): 2 mg via ORAL
  Filled 2014-07-21 (×6): qty 2

## 2014-07-21 MED ORDER — ERYTHROMYCIN 5 MG/GM OP OINT
1.0000 "application " | TOPICAL_OINTMENT | Freq: Two times a day (BID) | OPHTHALMIC | Status: DC
  Administered 2014-07-21: 1 via OPHTHALMIC
  Filled 2014-07-21: qty 3.5

## 2014-07-21 MED ORDER — ONDANSETRON HCL 4 MG/2ML IJ SOLN: 4.0000 mg | Freq: Four times a day (QID) | INTRAMUSCULAR | Status: DC | PRN

## 2014-07-21 MED ORDER — FENOFIBRATE 160 MG PO TABS
160.0000 mg | ORAL_TABLET | Freq: Every day | ORAL | Status: DC
  Filled 2014-07-21: qty 1

## 2014-07-21 MED ORDER — PANTOPRAZOLE SODIUM 40 MG PO TBEC
40.0000 mg | DELAYED_RELEASE_TABLET | Freq: Every day | ORAL | Status: DC
  Administered 2014-07-21: 40 mg via ORAL
  Filled 2014-07-21: qty 1

## 2014-07-21 MED ORDER — CLONAZEPAM 0.5 MG PO TABS
0.5000 mg | ORAL_TABLET | Freq: Three times a day (TID) | ORAL | Status: DC
  Administered 2014-07-21 (×2): 0.5 mg via ORAL
  Filled 2014-07-21 (×2): qty 1

## 2014-07-21 MED ORDER — CYCLOBENZAPRINE HCL 10 MG PO TABS: 10.0000 mg | ORAL_TABLET | ORAL | Status: DC

## 2014-07-21 MED ORDER — HYDROCHLOROTHIAZIDE 12.5 MG PO CAPS
12.5000 mg | ORAL_CAPSULE | Freq: Every day | ORAL | Status: DC
  Administered 2014-07-21: 12.5 mg via ORAL
  Filled 2014-07-21: qty 1

## 2014-07-21 MED ORDER — LORATADINE 10 MG PO TABS
10.0000 mg | ORAL_TABLET | Freq: Every day | ORAL | Status: DC
  Administered 2014-07-21: 10 mg via ORAL
  Filled 2014-07-21: qty 1

## 2014-07-21 MED ORDER — ENOXAPARIN SODIUM 40 MG/0.4ML ~~LOC~~ SOLN
40.0000 mg | Freq: Every day | SUBCUTANEOUS | Status: DC
  Administered 2014-07-21: 40 mg via SUBCUTANEOUS
  Filled 2014-07-21: qty 0.4

## 2014-07-21 MED ORDER — METHYLPREDNISOLONE SODIUM SUCC 125 MG IJ SOLR
80.0000 mg | Freq: Two times a day (BID) | INTRAMUSCULAR | Status: DC
  Administered 2014-07-21: 80 mg via INTRAVENOUS
  Filled 2014-07-21 (×2): qty 1.28

## 2014-07-21 MED ORDER — FLUOXETINE HCL 20 MG PO CAPS
40.0000 mg | ORAL_CAPSULE | Freq: Every day | ORAL | Status: DC
  Administered 2014-07-21: 40 mg via ORAL
  Filled 2014-07-21 (×2): qty 2

## 2014-07-21 MED ORDER — SODIUM CHLORIDE 0.9 % IV SOLN: INTRAVENOUS | Status: DC

## 2014-07-21 MED ORDER — INSULIN ASPART 100 UNIT/ML ~~LOC~~ SOLN: 0.0000 [IU] | Freq: Three times a day (TID) | SUBCUTANEOUS | Status: DC

## 2014-07-21 MED ORDER — SODIUM CHLORIDE 0.9 % IJ SOLN
3.0000 mL | Freq: Two times a day (BID) | INTRAMUSCULAR | Status: DC
  Administered 2014-07-21: 3 mL via INTRAVENOUS

## 2014-07-21 MED ORDER — INSULIN DETEMIR 100 UNIT/ML ~~LOC~~ SOLN: 10.0000 [IU] | Freq: Two times a day (BID) | SUBCUTANEOUS | Status: DC

## 2014-07-21 MED ORDER — PREDNISONE 50 MG PO TABS: ORAL_TABLET | ORAL | Status: DC

## 2014-07-21 MED ORDER — INSULIN ASPART 100 UNIT/ML ~~LOC~~ SOLN
15.0000 [IU] | Freq: Once | SUBCUTANEOUS | Status: AC
  Administered 2014-07-21: 15 [IU] via SUBCUTANEOUS

## 2014-07-21 MED ORDER — METHYLPREDNISOLONE SODIUM SUCC 125 MG IJ SOLR
125.0000 mg | Freq: Once | INTRAMUSCULAR | Status: AC
  Administered 2014-07-21: 125 mg via INTRAVENOUS
  Filled 2014-07-21: qty 2

## 2014-07-21 MED ORDER — DM-GUAIFENESIN ER 30-600 MG PO TB12
1.0000 | ORAL_TABLET | Freq: Two times a day (BID) | ORAL | Status: DC | PRN
  Filled 2014-07-21: qty 1

## 2014-07-21 MED ORDER — FLUTICASONE PROPIONATE 50 MCG/ACT NA SUSP
1.0000 | Freq: Every day | NASAL | Status: DC
  Administered 2014-07-21: 1 via NASAL
  Filled 2014-07-21: qty 16

## 2014-07-21 MED ORDER — INSULIN ASPART 100 UNIT/ML ~~LOC~~ SOLN
6.0000 [IU] | Freq: Once | SUBCUTANEOUS | Status: AC
  Administered 2014-07-21: 6 [IU] via SUBCUTANEOUS

## 2014-07-21 MED ORDER — OXYCODONE HCL 5 MG PO TABS
5.0000 mg | ORAL_TABLET | ORAL | Status: DC | PRN
  Administered 2014-07-21: 5 mg via ORAL
  Filled 2014-07-21: qty 1

## 2014-07-21 MED ORDER — INSULIN DETEMIR 100 UNIT/ML ~~LOC~~ SOLN
10.0000 [IU] | Freq: Two times a day (BID) | SUBCUTANEOUS | Status: DC
  Administered 2014-07-21 (×2): 10 [IU] via SUBCUTANEOUS
  Filled 2014-07-21 (×3): qty 0.1

## 2014-07-21 MED ORDER — MONTELUKAST SODIUM 10 MG PO TABS
10.0000 mg | ORAL_TABLET | Freq: Every day | ORAL | Status: DC
  Administered 2014-07-21: 10 mg via ORAL
  Filled 2014-07-21: qty 1

## 2014-07-21 MED ORDER — ALUM & MAG HYDROXIDE-SIMETH 200-200-20 MG/5ML PO SUSP: 30.0000 mL | Freq: Four times a day (QID) | ORAL | Status: DC | PRN

## 2014-07-21 MED ORDER — SODIUM CHLORIDE 0.9 % IJ SOLN: 3.0000 mL | Freq: Two times a day (BID) | INTRAMUSCULAR | Status: DC

## 2014-07-21 MED ORDER — HYDROMORPHONE HCL 1 MG/ML IJ SOLN: 0.5000 mg | INTRAMUSCULAR | Status: DC | PRN

## 2014-07-21 MED ORDER — FERROUS SULFATE 325 (65 FE) MG PO TABS
325.0000 mg | ORAL_TABLET | Freq: Every day | ORAL | Status: DC
  Administered 2014-07-21: 325 mg via ORAL
  Filled 2014-07-21 (×2): qty 1

## 2014-07-21 MED ORDER — SODIUM CHLORIDE 0.9 % IJ SOLN: 3.0000 mL | INTRAMUSCULAR | Status: DC | PRN

## 2014-07-21 MED ORDER — ONDANSETRON HCL 4 MG PO TABS
4.0000 mg | ORAL_TABLET | Freq: Four times a day (QID) | ORAL | Status: DC | PRN
  Administered 2014-07-21: 4 mg via ORAL
  Filled 2014-07-21: qty 1

## 2014-07-21 MED ORDER — B COMPLEX-C PO TABS
1.0000 | ORAL_TABLET | Freq: Every day | ORAL | Status: DC
  Administered 2014-07-21: 1 via ORAL
  Filled 2014-07-21: qty 1

## 2014-07-21 NOTE — Progress Notes (Signed)
Utilization review completed.  

## 2014-07-21 NOTE — Discharge Summary (Signed)
Physician Discharge Summary  NYIMAH SHADDUCK MWN:[id number] DOB: 1951-01-12 DOA: 07/20/2014  PCP: Jerlyn Ly, MD  Admit date: 07/20/2014 Discharge date: 07/21/2014  Time spent: 35 minutes  Recommendations for Outpatient Follow-up:  1. Please reassess serum creatinine within the next week 2. Ibuprofen held on discharge  Discharge Diagnoses:  Principal Problem:   AKI (acute kidney injury) Active Problems:   DM (diabetes mellitus)   Hyperlipidemia   Hypertension   Hypothyroidism   Bronchitis   Asthmatic bronchitis   Discharge Condition: Stable  Diet recommendation: Heart healthy  Filed Weights   07/20/14 1805 07/21/14 0152  Weight: 81.647 kg (180 lb) 95.4 kg (210 lb 5.1 oz)    History of present illness:  Patient is a 64 year old with history of hypertension, diabetes mellitus type 2, hyperlipidemia, hypothyroidism presented with asthma exacerbation.  Hospital Course:  Asthma exacerbation -Improved with bronchodilators and steroids - Patient will be discharged on prednisone for 4 more days to complete a five-day treatment course.  AKI - Improved with IV fluid rehydration - We'll hold ibuprofen on discharge - Recommend reassessing within the next week  For other known medical conditions listed above we'll plan on continuing home medication regimen  Procedures:  None  Consultations: None  Discharge Exam: Filed Vitals:   07/21/14 1443  BP: 103/75  Pulse: 80  Temp:   Resp:     General: Pt in nad, alert and awake Cardiovascular: rrr, no mrg Respiratory: cta bl, no wheezes, prolonged expiratory phase.  Discharge Instructions   Discharge Instructions    Call MD for:  difficulty breathing, headache or visual disturbances    Complete by:  As directed      Call MD for:  temperature >100.4    Complete by:  As directed      Diet - low sodium heart healthy    Complete by:  As directed      Discharge instructions    Complete by:  As directed   Please  follow-up with your primary care physician within the next week for further evaluation recommendations.     Increase activity slowly    Complete by:  As directed           Current Discharge Medication List    START taking these medications   Details  predniSONE (DELTASONE) 50 MG tablet Take 1 tablet by mouth daily Qty: 4 tablet, Refills: 0      CONTINUE these medications which have CHANGED   Details  insulin detemir (LEVEMIR) 100 UNIT/ML injection Inject 0.1 mLs (10 Units total) into the skin 2 (two) times daily. Qty: 10 mL, Refills: 11      CONTINUE these medications which have NOT CHANGED   Details  albuterol (PROVENTIL HFA;VENTOLIN HFA) 108 (90 BASE) MCG/ACT inhaler Inhale 2 puffs into the lungs every 6 (six) hours as needed for wheezing or shortness of breath. PROAIR    aspirin EC 81 MG tablet Take 81 mg by mouth at bedtime.    b complex vitamins tablet Take 1 tablet by mouth daily.    cetirizine (ZYRTEC) 10 MG tablet Take 10 mg by mouth See admin instructions. Take 1 tablet (10 mg) twice daily during allergy season    Chlorpheniramine Maleate (CHLOR-TRIMETON PO) Take 1 tablet by mouth See admin instructions. Take 1 tablet four times daily during allergy season    clonazePAM (KLONOPIN) 0.5 MG tablet Take 0.5 mg by mouth 3 (three) times daily.  Refills: 0    cyclobenzaprine (FLEXERIL) 10 MG tablet Take  10 mg by mouth See admin instructions. Take 1 tablet (10 mg) every night at bedtime, may take an additional tablet during the day as needed for muscle spasms Refills: 0    dextromethorphan-guaiFENesin (MUCINEX DM) 30-600 MG per 12 hr tablet Take 1 tablet by mouth 2 (two) times daily as needed for cough (congestion).    diclofenac sodium (VOLTAREN) 1 % GEL Apply 1 application topically 2 (two) times daily as needed (arthritis pain - hands).    erythromycin ophthalmic ointment Place 1 application into both eyes 2 (two) times daily. Started 07/18/14 for pink eye Refills: 0     fenofibrate 160 MG tablet Take 160 mg by mouth daily with supper.     ferrous sulfate 325 (65 FE) MG tablet Take 325 mg by mouth daily with breakfast.    FLUoxetine (PROZAC) 40 MG capsule Take 40 mg by mouth at bedtime.  Refills: 1    Fluticasone Propionate (FLONASE ALLERGY RELIEF NA) Place 2 sprays into both nostrils See admin instructions. Use 2 sprays each nare daily at bedtime during allergy season    gabapentin (NEURONTIN) 100 MG capsule Take 100 mg by mouth 3 (three) times daily.    hydrochlorothiazide (MICROZIDE) 12.5 MG capsule Take 12.5 mg by mouth daily.  Refills: 1    insulin aspart (NOVOLOG) 100 UNIT/ML injection Inject 5-25 Units into the skin 3 (three) times daily as needed for high blood sugar (CBG >120). Per sliding scale    levothyroxine (SYNTHROID, LEVOTHROID) 75 MCG tablet Take 75 mcg by mouth See admin instructions. Take 1 tablet (75 mcg) every morning before breakfast except Mondays (Do not take on Mondays)    metoprolol (LOPRESSOR) 50 MG tablet Take 50 mg by mouth 3 (three) times daily.  Refills: 0    mometasone (ASMANEX) 220 MCG/INH inhaler Inhale 1 puff into the lungs 2 (two) times daily.    montelukast (SINGULAIR) 10 MG tablet Take 10 mg by mouth daily.     Multiple Vitamins-Minerals (HAIR/SKIN/NAILS/BIOTIN) TABS Take 1 tablet by mouth daily.    omeprazole (PRILOSEC) 20 MG capsule Take 20 mg by mouth daily with supper.     ondansetron (ZOFRAN-ODT) 4 MG disintegrating tablet Take 4 mg by mouth daily as needed (nausea from pain medication).  Refills: 0    oxyCODONE-acetaminophen (ROXICET) 5-325 MG per tablet Take 1 tablet by mouth every 4 (four) hours as needed for severe pain. Qty: 35 tablet, Refills: 0    Phentermine-Topiramate 11.25-69 MG CP24 Take 1 tablet by mouth daily.    potassium citrate (UROCIT-K) 10 MEQ (1080 MG) SR tablet Take 10 mEq by mouth 4 (four) times daily.  Refills: 1    Probiotic Product (PROBIOTIC DAILY PO) Take 1 capsule by mouth  daily.     rOPINIRole (REQUIP) 2 MG tablet Take 2 mg by mouth 4 (four) times daily.     zolpidem (AMBIEN) 5 MG tablet Take 5 mg by mouth at bedtime as needed for sleep.  Refills: 0      STOP taking these medications     amoxicillin (AMOXIL) 500 MG capsule      cephALEXin (KEFLEX) 250 MG capsule      ibuprofen (ADVIL,MOTRIN) 200 MG tablet      ondansetron (ZOFRAN) 4 MG tablet      ondansetron (ZOFRAN) 4 MG tablet      ondansetron (ZOFRAN) 4 MG tablet        Allergies  Allergen Reactions  . Codeine Nausea Only  . Imitrex [Sumatriptan] Other (See  Comments)    Facial spasms  . Morphine Other (See Comments)    Severe headaches   . Nsaids Other (See Comments)    PT UNABLE TO TOLERATE NSAID'S DRUGS DUE TO HX OF GASTRIC SLEEVE SURGERY  . Promethazine Hcl Other (See Comments)    Restless leg feeling all over body  . Statins Other (See Comments)    Leg/muscle pain  . Sulfonamide Derivatives Hives  . Vicodin [Hydrocodone-Acetaminophen] Nausea And Vomiting      The results of significant diagnostics from this hospitalization (including imaging, microbiology, ancillary and laboratory) are listed below for reference.    Significant Diagnostic Studies: Dg Chest 2 View  07/20/2014   CLINICAL DATA:  Cough x1 week  EXAM: CHEST  2 VIEW  COMPARISON:  11/27/2013  FINDINGS: Lungs are clear.  No pleural effusion or pneumothorax.  The heart is top-normal in size.  Degenerative changes of the visualized thoracolumbar spine.  Cholecystectomy clips.  IMPRESSION: No evidence of acute cardiopulmonary disease.   Electronically Signed   By: Julian Hy M.D.   On: 07/20/2014 15:54    Microbiology: Recent Results (from the past 240 hour(s))  MRSA PCR Screening     Status: None   Collection Time: 07/21/14  3:09 AM  Result Value Ref Range Status   MRSA by PCR NEGATIVE NEGATIVE Final    Comment:        The GeneXpert MRSA Assay (FDA approved for NASAL specimens only), is one component of  a comprehensive MRSA colonization surveillance program. It is not intended to diagnose MRSA infection nor to guide or monitor treatment for MRSA infections.      Labs: Basic Metabolic Panel:  Recent Labs Lab 07/20/14 1639 07/20/14 2021 07/21/14 0700  NA 140 138 139  K 3.4* 3.5 4.2  CL 102 101 103  CO2  --  23 26  GLUCOSE 248* 261* 245*  BUN 30* 27* 25*  CREATININE 1.50* 1.49* 1.32*  CALCIUM  --  9.5 9.5   Liver Function Tests: No results for input(s): AST, ALT, ALKPHOS, BILITOT, PROT, ALBUMIN in the last 168 hours. No results for input(s): LIPASE, AMYLASE in the last 168 hours. No results for input(s): AMMONIA in the last 168 hours. CBC:  Recent Labs Lab 07/20/14 1639 07/20/14 2021 07/21/14 0700  WBC  --  3.2* 3.5*  NEUTROABS  --  2.7  --   HGB 12.2 11.5* 10.9*  HCT 36.0 34.8* 33.3*  MCV  --  87.2 88.3  PLT  --  143* 144*   Cardiac Enzymes: No results for input(s): CKTOTAL, CKMB, CKMBINDEX, TROPONINI in the last 168 hours. BNP: BNP (last 3 results) No results for input(s): BNP in the last 8760 hours.  ProBNP (last 3 results) No results for input(s): PROBNP in the last 8760 hours.  CBG:  Recent Labs Lab 07/21/14 0748 07/21/14 1139  GLUCAP 215* 420*       Signed:  Velvet Bathe  [facility] 07/21/2014, 3:01 PM

## 2014-07-21 NOTE — Progress Notes (Signed)
Discussed discharge instructions and medications with patient.  Prescription given to patient.  All questions answered.

## 2014-07-21 NOTE — H&P (Signed)
[facility] Admission History and Physical       [name] NFA:[id number] DOB: [birth year] DOA: 07/20/2014  Referring physician: EDP PCP: Jerlyn Ly, MD  Specialists:   Chief Complaint: SOB, Wheezing,  and Cough  HPI: [name] is a 64 y.o. female with a history of HTN, DM2,  Hyperlipidemia, Hypothyroidism, who presents to the ED with complaints of increased non-productive cough and SOB x 1 week .  She has increased wheezing over the past 24 hours and went to the [facility] this afternoon and was sent to the ED due to hypoxia, and tachycardia.   She was further evalauted and administered IV Fluids, and Nebulizer treatments in the ED  And had mild improvement.  He labs also revealed an elevation in her Creatinine from her baseline.    She was referred for  medical admission for Asthmatic Bronchitis and Acute Kidney Injury.     Review of Systems:  Constitutional: No Weight Loss, No Weight Gain, Night Sweats, Fevers, Chills, Dizziness, Light Headedness, Fatigue, or Generalized Weakness HEENT: No Headaches, Difficulty Swallowing,Tooth/Dental Problems,Sore Throat,  No Sneezing, Rhinitis, Ear Ache, Nasal Congestion, or Post Nasal Drip,  Cardio-vascular:  No Chest pain, Orthopnea, PND, Edema in Lower Extremities, Anasarca, Dizziness, Palpitations  Resp: +Dyspnea, No DOE, + Productive Cough, No Non-Productive Cough, No Hemoptysis, +Wheezing.    GI: No Heartburn, Indigestion, Abdominal Pain, Nausea, Vomiting, Diarrhea, Constipation, Hematemesis, Hematochezia, Melena, Change in Bowel Habits,  Loss of Appetite  GU: No Dysuria, No Change in Color of Urine, No Urgency or Urinary Frequency, No Flank pain.  Musculoskeletal: No Joint Pain or Swelling, No Decreased Range of Motion, No Back Pain.  Neurologic: No Syncope, No Seizures, Muscle Weakness, Paresthesia, Vision Disturbance or Loss, No Diplopia, No Vertigo, No Difficulty Walking,  Skin: No Rash or  Lesions. Psych: No Change in Mood or Affect, No Depression or Anxiety, No Memory loss, No Confusion, or Hallucinations   Past Medical History  Diagnosis Date  . Urticaria   . Fibromyalgia   . Tachycardia   . DM (diabetes mellitus)   . Hyperlipidemia   . Bell's palsy   . Hypertension   . Hypothyroidism   . Obesity   . UTI (urinary tract infection)   . Nephrolithiasis   . Anemia   . Carbuncle and furuncle of trunk   . Fracture MAY 2014    HX OF FRACTURED NECK C1- CAUSES SEVERE HEADACHES--LIMITED ROM NECK  . Disorder of fascia     HX OF NECROTIC FASCITIS AFTER ABDOMINAL SURGERY FOR HERNIA- REQUIRED 19 SURGERIES AND 2.5 MONTH HOSPITALIZATION AT [facility]  . Pain     LEFT SHOULDER  PAIN -HARD TO LIE ON LEFT SIDE FOR LONG PERIOD;  PAIN IN LOWER BADK - 3 HERNIATED DISCS AND STENOISIS -  . Elevated cholesterol   . Heart murmur     DOES NOT CAUSE ANY PROBLEMS  . Dysrhythmia     HX OF TACHYCARDIA AND BRADYCARDIA - ON GOING FOR YEARS - DOES NOT HAVE TO SEE CARDIOLOGIST  . Asthma     OCCAS  . Migraine   . Arthritis   . Urinary frequency   . Restless leg syndrome   . OSA (obstructive sleep apnea)     USES CPAP - DOES NOT KNOW SETTING  . Frequent infections     ESPECIALLY PRONE TO INFECTIONS AFTER SURGERIES  . PONV (postoperative nausea and vomiting)     THE GAS MAKES ME NAUSEATED  Past Surgical History  Procedure Laterality Date  . Appendectomy    . Abdominal hysterectomy    . Cholecystectomy    . Cesarean section      X 3  . Carpal tunnel release      BILATERAL  . Knee arthroscopy  RIGHT AND LEFT    X 2  . Shoulder arthroscopy Right     X 2  . Laparoscopic gastric sleeve resection  2014  . Hernia repair      ABDOMINAL HERNIA REPAIR WITH MESH - 3 SURGERIES   . Joint replacement      TOTAL RIGHT KNEE REPLACEMENT  . Right foot drainage of infection    . I&d of infected site in belly - from an injection    . Tonsillectomy      AND ADENOIDECTOMY  . Hx of 19  surgeries for necrotic fascitis    . Cystoscopy with ureteroscopy Right 12/03/2013    Procedure: CYSTOSCOPY WITH RIGHT RETROGRADE URETEROSCOPY LASER LITHOTRIPSY RIGHT STONE RIGHT URETERAL STENT, ;  Surgeon: Raynelle Bring, MD;  Location: [facility];  Service: Urology;  Laterality: Right;  PROCEDURE WAS ORIGINALLY SCHEDULED AS RIGHT PERCUTANEOUS NEPHROLITHOTOMY  . Nephrolithotomy Right 12/10/2013    Procedure: NEPHROLITHOTOMY PERCUTANEOUS;  Surgeon: Raynelle Bring, MD;  Location: [facility];  Service: Urology;  Laterality: Right;      Prior to Admission medications   Medication Sig Start Date End Date Taking? Authorizing Provider  albuterol (PROVENTIL HFA;VENTOLIN HFA) 108 (90 BASE) MCG/ACT inhaler Inhale 2 puffs into the lungs every 6 (six) hours as needed for wheezing or shortness of breath. PROAIR   Yes Historical Provider, MD  amoxicillin (AMOXIL) 500 MG capsule Take 2,000 mg by mouth See admin instructions. Take 4 capsules (2000 mg) by mouth 1 hour prior to dental appointment 06/12/14  Yes Historical Provider, MD  aspirin EC 81 MG tablet Take 81 mg by mouth at bedtime.   Yes Historical Provider, MD  b complex vitamins tablet Take 1 tablet by mouth daily.   Yes Historical Provider, MD  cephALEXin (KEFLEX) 250 MG capsule Take 125 mg by mouth at bedtime. Prophylaxis UTI infections   Yes Historical Provider, MD  cetirizine (ZYRTEC) 10 MG tablet Take 10 mg by mouth See admin instructions. Take 1 tablet (10 mg) twice daily during allergy season   Yes Historical Provider, MD  Chlorpheniramine Maleate (CHLOR-TRIMETON PO) Take 1 tablet by mouth See admin instructions. Take 1 tablet four times daily during allergy season   Yes Historical Provider, MD  clonazePAM (KLONOPIN) 0.5 MG tablet Take 0.5 mg by mouth 3 (three) times daily.  07/01/14  Yes Historical Provider, MD  cyclobenzaprine (FLEXERIL) 10 MG tablet Take 10 mg by mouth See admin instructions. Take 1 tablet (10 mg) every night at bedtime, may take an  additional tablet during the day as needed for muscle spasms 07/15/14  Yes Historical Provider, MD  dextromethorphan-guaiFENesin (MUCINEX DM) 30-600 MG per 12 hr tablet Take 1 tablet by mouth 2 (two) times daily as needed for cough (congestion).   Yes Historical Provider, MD  diclofenac sodium (VOLTAREN) 1 % GEL Apply 1 application topically 2 (two) times daily as needed (arthritis pain - hands).   Yes Historical Provider, MD  erythromycin ophthalmic ointment Place 1 application into both eyes 2 (two) times daily. Started 07/18/14 for pink eye 07/18/14  Yes Historical Provider, MD  fenofibrate 160 MG tablet Take 160 mg by mouth daily with supper.    Yes Historical Provider, MD  ferrous  sulfate 325 (65 FE) MG tablet Take 325 mg by mouth daily with breakfast.   Yes Historical Provider, MD  FLUoxetine (PROZAC) 40 MG capsule Take 40 mg by mouth at bedtime.  07/14/14  Yes Historical Provider, MD  Fluticasone Propionate (FLONASE ALLERGY RELIEF NA) Place 2 sprays into both nostrils See admin instructions. Use 2 sprays each nare daily at bedtime during allergy season   Yes Historical Provider, MD  gabapentin (NEURONTIN) 100 MG capsule Take 100 mg by mouth 3 (three) times daily.   Yes Historical Provider, MD  hydrochlorothiazide (MICROZIDE) 12.5 MG capsule Take 12.5 mg by mouth daily.  07/01/14  Yes Historical Provider, MD  ibuprofen (ADVIL,MOTRIN) 200 MG tablet Take 800 mg by mouth daily as needed (pain).   Yes Historical Provider, MD  insulin aspart (NOVOLOG) 100 UNIT/ML injection Inject 5-25 Units into the skin 3 (three) times daily as needed for high blood sugar (CBG >120). Per sliding scale   Yes Historical Provider, MD  insulin detemir (LEVEMIR) 100 UNIT/ML injection Inject 20-25 Units into the skin 2 (two) times daily. Based on CBG level   Yes Historical Provider, MD  levothyroxine (SYNTHROID, LEVOTHROID) 75 MCG tablet Take 75 mcg by mouth See admin instructions. Take 1 tablet (75 mcg) every morning before  breakfast except Mondays (Do not take on Mondays)   Yes Historical Provider, MD  metoprolol (LOPRESSOR) 50 MG tablet Take 50 mg by mouth 3 (three) times daily.  06/28/14  Yes Historical Provider, MD  mometasone (ASMANEX) 220 MCG/INH inhaler Inhale 1 puff into the lungs 2 (two) times daily.   Yes Historical Provider, MD  montelukast (SINGULAIR) 10 MG tablet Take 10 mg by mouth daily.    Yes Historical Provider, MD  Multiple Vitamins-Minerals (HAIR/SKIN/NAILS/BIOTIN) TABS Take 1 tablet by mouth daily.   Yes Historical Provider, MD  omeprazole (PRILOSEC) 20 MG capsule Take 20 mg by mouth daily with supper.    Yes Historical Provider, MD  ondansetron (ZOFRAN-ODT) 4 MG disintegrating tablet Take 4 mg by mouth daily as needed (nausea from pain medication).  06/03/14  Yes Historical Provider, MD  oxyCODONE-acetaminophen (ROXICET) 5-325 MG per tablet Take 1 tablet by mouth every 4 (four) hours as needed for severe pain. Patient taking differently: Take 1 tablet by mouth daily as needed for severe pain.  12/10/13  Yes Raynelle Bring, MD  Phentermine-Topiramate 11.25-69 MG CP24 Take 1 tablet by mouth daily.   Yes Historical Provider, MD  potassium citrate (UROCIT-K) 10 MEQ (1080 MG) SR tablet Take 10 mEq by mouth 4 (four) times daily.  06/17/14  Yes Historical Provider, MD  Probiotic Product (PROBIOTIC DAILY PO) Take 1 capsule by mouth daily.    Yes Historical Provider, MD  rOPINIRole (REQUIP) 2 MG tablet Take 2 mg by mouth 4 (four) times daily.    Yes Historical Provider, MD  zolpidem (AMBIEN) 5 MG tablet Take 5 mg by mouth at bedtime as needed for sleep.  06/12/14  Yes Historical Provider, MD  ondansetron (ZOFRAN) 4 MG tablet Take 1 tablet (4 mg total) by mouth every 8 (eight) hours as needed for nausea or vomiting. Patient not taking: Reported on 07/20/2014 12/03/13   Raynelle Bring, MD  ondansetron (ZOFRAN) 4 MG tablet Take 1 tablet (4 mg total) by mouth every 8 (eight) hours as needed for nausea or  vomiting. Patient not taking: Reported on 07/20/2014 12/10/13   Raynelle Bring, MD  ondansetron (ZOFRAN) 4 MG tablet Take 1 tablet (4 mg total) by mouth every 8 (eight)  hours as needed for nausea or vomiting. Patient not taking: Reported on 07/20/2014 12/10/13   Raynelle Bring, MD     Allergies  Allergen Reactions  . Codeine Nausea Only  . Imitrex [Sumatriptan] Other (See Comments)    Facial spasms  . Morphine Other (See Comments)    Severe headaches   . Nsaids Other (See Comments)    PT UNABLE TO TOLERATE NSAID'S DRUGS DUE TO HX OF GASTRIC SLEEVE SURGERY  . Promethazine Hcl Other (See Comments)    Restless leg feeling all over body  . Statins Other (See Comments)    Leg/muscle pain  . Sulfonamide Derivatives Hives  . Vicodin [Hydrocodone-Acetaminophen] Nausea And Vomiting    Social History:  reports that she has never smoked. She has never used smokeless tobacco. She reports that she does not drink alcohol or use illicit drugs.    Family History  Problem Relation Age of Onset  . Diabetes Father   . Osteoarthritis Father   . Heart disease Father   . Ulcers Father   . Stroke Mother        Physical Exam:  GEN:  Pleasant Obese 64 y.o.  Caucasian female examined and in no acute distress; cooperative with exam Filed Vitals:   07/20/14 2025 07/20/14 2045 07/20/14 2200 07/21/14 0000  BP:  125/56 107/43 127/58  Pulse:  82 104 103  Temp:      TempSrc:      Resp:      Height:      Weight:      SpO2: 94% 100% 92% 94%   Blood pressure 127/58, pulse 103, temperature 97.5 F (36.4 C), temperature source Oral, resp. rate 18, height 5\' 1"  (1.549 m), weight 81.647 kg (180 lb), SpO2 94 %. PSYCH: She is alert and oriented x4; does not appear anxious does not appear depressed; affect is normal HEENT: Normocephalic and Atraumatic, Mucous membranes pink; PERRLA; EOM intact; Fundi:  Benign;  No scleral icterus, Nares: Patent, Oropharynx: Clear, Fair Dentition,    Neck:  FROM, No Cervical  Lymphadenopathy nor Thyromegaly or Carotid Bruit; No JVD; Breasts:: Not examined CHEST WALL: No tenderness CHEST: Diffuse Expiratory Wheezes, No rales or Rhonchi HEART: Mild Tachycardia, no murmurs rubs or gallops BACK: No kyphosis or scoliosis; No CVA tenderness ABDOMEN: Positive Bowel Sounds, Obese, Soft Non-Tender, No Rebound or Guarding; No Masses, No Organomegaly, No Pannus; No Intertriginous candida. Rectal Exam: Not done EXTREMITIES: No Cyanosis, Clubbing, or Edema; No Ulcerations. Genitalia: not examined PULSES: 2+ and symmetric SKIN: Normal hydration no rash or ulceration CNS:  Alert and Oriented x 4, No Focal Deficits Gait: deferred  Vascular: pulses palpable throughout    Labs on Admission:  Basic Metabolic Panel:  Recent Labs Lab 07/20/14 1639 07/20/14 2021  NA 140 138  K 3.4* 3.5  CL 102 101  CO2  --  23  GLUCOSE 248* 261*  BUN 30* 27*  CREATININE 1.50* 1.49*  CALCIUM  --  9.5   Liver Function Tests: No results for input(s): AST, ALT, ALKPHOS, BILITOT, PROT, ALBUMIN in the last 168 hours. No results for input(s): LIPASE, AMYLASE in the last 168 hours. No results for input(s): AMMONIA in the last 168 hours. CBC:  Recent Labs Lab 07/20/14 1639 07/20/14 2021  WBC  --  3.2*  NEUTROABS  --  2.7  HGB 12.2 11.5*  HCT 36.0 34.8*  MCV  --  87.2  PLT  --  143*   Cardiac Enzymes: No results for input(s): CKTOTAL, CKMB,  CKMBINDEX, TROPONINI in the last 168 hours.  BNP (last 3 results) No results for input(s): BNP in the last 8760 hours.  ProBNP (last 3 results) No results for input(s): PROBNP in the last 8760 hours.  CBG: No results for input(s): GLUCAP in the last 168 hours.  Radiological Exams on Admission: Dg Chest 2 View  07/20/2014   CLINICAL DATA:  Cough x1 week  EXAM: CHEST  2 VIEW  COMPARISON:  11/27/2013  FINDINGS: Lungs are clear.  No pleural effusion or pneumothorax.  The heart is top-normal in size.  Degenerative changes of the visualized  thoracolumbar spine.  Cholecystectomy clips.  IMPRESSION: No evidence of acute cardiopulmonary disease.   Electronically Signed   By: Julian Hy M.D.   On: 07/20/2014 15:54     EKG: Independently reviewed.          Assessment/Plan:   64 y.o. female with  Principal Problem:   1.    AKI (acute kidney injury)   Gentle IVFs   Monitor BUN/Cr    Previous Creatinine = 0.6, now 1.5   Active Problems:  2.    Asthmatic bronchitis   IV Solumedrol   Duonebs   O2   Monitor O2 Sats    3.    DM (diabetes mellitus)   Continue Levemir BID   SSI coverage PRN    4.    Hyperlipidemia   Continue Fenofibrate       5.    Hypertension   Continue Metoprolol, and HCTZ   Monitor BPs   6.    Hypothyroidism   Continue Leveothyroxine       7.  DVT Prophylaxis   Lovenox         Code Status:     FULL CODE        Family Communication:   Husband at Bedside    Disposition Plan:    Inpatient Status        Time spent:  [facility] Pager [phone]   If [place] Please Contact the Day Rounding Team MD for [facility]  If 7PM-7AM, Please Contact Night-Floor Coverage  [email or web address] Password [email or web address] 07/21/2014, 12:42 AM     ADDENDUM:   Patient was seen and examined on 07/21/2014

## 2014-07-21 NOTE — Progress Notes (Signed)
Pt is able to place herself on and off CPAP as needed. Pt encouraged to call RT if needed.

## 2014-08-21 ENCOUNTER — Other Ambulatory Visit (HOSPITAL_COMMUNITY): Payer: Self-pay | Admitting: Internal Medicine

## 2014-08-21 ENCOUNTER — Ambulatory Visit (HOSPITAL_COMMUNITY)
Admission: RE | Admit: 2014-08-21 | Discharge: 2014-08-21 | Disposition: A | Discharge status: Home / Self Care | Payer: Managed Care, Other (non HMO) | Source: Ambulatory Visit | Attending: Vascular Surgery | Admitting: Vascular Surgery

## 2014-08-21 DIAGNOSIS — M79605 Pain in left leg: Secondary | ICD-10-CM | POA: Diagnosis not present

## 2014-08-21 DIAGNOSIS — M79662 Pain in left lower leg: Secondary | ICD-10-CM | POA: Insufficient documentation

## 2014-08-21 NOTE — Progress Notes (Signed)
Preliminary report phoned and given to Amy   @ [facility]

## 2015-01-13 DIAGNOSIS — R635 Abnormal weight gain: Secondary | ICD-10-CM | POA: Insufficient documentation

## 2015-01-17 ENCOUNTER — Other Ambulatory Visit: Payer: Self-pay

## 2015-01-17 ENCOUNTER — Other Ambulatory Visit: Payer: Self-pay | Admitting: General Surgery

## 2015-01-17 DIAGNOSIS — Z9989 Dependence on other enabling machines and devices: Secondary | ICD-10-CM

## 2015-01-17 DIAGNOSIS — G4733 Obstructive sleep apnea (adult) (pediatric): Secondary | ICD-10-CM

## 2015-01-17 DIAGNOSIS — E785 Hyperlipidemia, unspecified: Secondary | ICD-10-CM

## 2015-01-17 DIAGNOSIS — R635 Abnormal weight gain: Secondary | ICD-10-CM

## 2015-01-17 DIAGNOSIS — E119 Type 2 diabetes mellitus without complications: Secondary | ICD-10-CM

## 2015-01-27 ENCOUNTER — Other Ambulatory Visit: Payer: Self-pay | Admitting: General Surgery

## 2015-01-27 ENCOUNTER — Ambulatory Visit
Admission: RE | Admit: 2015-01-27 | Discharge: 2015-01-27 | Disposition: A | Discharge status: Home / Self Care | Payer: Managed Care, Other (non HMO) | Source: Ambulatory Visit | Attending: General Surgery | Admitting: General Surgery

## 2015-01-27 DIAGNOSIS — G4733 Obstructive sleep apnea (adult) (pediatric): Secondary | ICD-10-CM

## 2015-01-27 DIAGNOSIS — Z9989 Dependence on other enabling machines and devices: Secondary | ICD-10-CM

## 2015-01-27 DIAGNOSIS — E785 Hyperlipidemia, unspecified: Secondary | ICD-10-CM

## 2015-01-27 DIAGNOSIS — E119 Type 2 diabetes mellitus without complications: Secondary | ICD-10-CM

## 2015-01-27 DIAGNOSIS — R635 Abnormal weight gain: Secondary | ICD-10-CM

## 2015-04-21 DIAGNOSIS — M19049 Primary osteoarthritis, unspecified hand: Secondary | ICD-10-CM | POA: Insufficient documentation

## 2015-06-25 ENCOUNTER — Emergency Department (HOSPITAL_COMMUNITY)
Admission: EM | Admit: 2015-06-25 | Discharge: 2015-06-25 | Disposition: A | Discharge status: Home / Self Care | Payer: 59 | Attending: Emergency Medicine | Admitting: Emergency Medicine

## 2015-06-25 ENCOUNTER — Emergency Department (HOSPITAL_COMMUNITY): Payer: 59

## 2015-06-25 ENCOUNTER — Encounter (HOSPITAL_COMMUNITY): Payer: Self-pay

## 2015-06-25 DIAGNOSIS — G2581 Restless legs syndrome: Secondary | ICD-10-CM | POA: Diagnosis not present

## 2015-06-25 DIAGNOSIS — G43909 Migraine, unspecified, not intractable, without status migrainosus: Secondary | ICD-10-CM | POA: Diagnosis not present

## 2015-06-25 DIAGNOSIS — R079 Chest pain, unspecified: Secondary | ICD-10-CM

## 2015-06-25 DIAGNOSIS — M1990 Unspecified osteoarthritis, unspecified site: Secondary | ICD-10-CM | POA: Diagnosis not present

## 2015-06-25 DIAGNOSIS — E039 Hypothyroidism, unspecified: Secondary | ICD-10-CM | POA: Diagnosis not present

## 2015-06-25 DIAGNOSIS — J159 Unspecified bacterial pneumonia: Secondary | ICD-10-CM | POA: Insufficient documentation

## 2015-06-25 DIAGNOSIS — R0981 Nasal congestion: Secondary | ICD-10-CM

## 2015-06-25 DIAGNOSIS — Z7951 Long term (current) use of inhaled steroids: Secondary | ICD-10-CM | POA: Diagnosis not present

## 2015-06-25 DIAGNOSIS — D649 Anemia, unspecified: Secondary | ICD-10-CM | POA: Insufficient documentation

## 2015-06-25 DIAGNOSIS — J189 Pneumonia, unspecified organism: Secondary | ICD-10-CM

## 2015-06-25 DIAGNOSIS — J0140 Acute pansinusitis, unspecified: Secondary | ICD-10-CM | POA: Diagnosis not present

## 2015-06-25 DIAGNOSIS — R0781 Pleurodynia: Secondary | ICD-10-CM | POA: Diagnosis not present

## 2015-06-25 DIAGNOSIS — Z8781 Personal history of (healed) traumatic fracture: Secondary | ICD-10-CM | POA: Insufficient documentation

## 2015-06-25 DIAGNOSIS — G4733 Obstructive sleep apnea (adult) (pediatric): Secondary | ICD-10-CM | POA: Insufficient documentation

## 2015-06-25 DIAGNOSIS — Z9884 Bariatric surgery status: Secondary | ICD-10-CM | POA: Insufficient documentation

## 2015-06-25 DIAGNOSIS — E785 Hyperlipidemia, unspecified: Secondary | ICD-10-CM | POA: Diagnosis not present

## 2015-06-25 DIAGNOSIS — E119 Type 2 diabetes mellitus without complications: Secondary | ICD-10-CM | POA: Insufficient documentation

## 2015-06-25 DIAGNOSIS — Z7952 Long term (current) use of systemic steroids: Secondary | ICD-10-CM | POA: Insufficient documentation

## 2015-06-25 DIAGNOSIS — Z79899 Other long term (current) drug therapy: Secondary | ICD-10-CM | POA: Diagnosis not present

## 2015-06-25 DIAGNOSIS — R011 Cardiac murmur, unspecified: Secondary | ICD-10-CM | POA: Diagnosis not present

## 2015-06-25 DIAGNOSIS — Z8619 Personal history of other infectious and parasitic diseases: Secondary | ICD-10-CM | POA: Diagnosis not present

## 2015-06-25 DIAGNOSIS — Z87440 Personal history of urinary (tract) infections: Secondary | ICD-10-CM | POA: Diagnosis not present

## 2015-06-25 DIAGNOSIS — J45909 Unspecified asthma, uncomplicated: Secondary | ICD-10-CM | POA: Insufficient documentation

## 2015-06-25 DIAGNOSIS — Z87442 Personal history of urinary calculi: Secondary | ICD-10-CM | POA: Diagnosis not present

## 2015-06-25 DIAGNOSIS — J45901 Unspecified asthma with (acute) exacerbation: Secondary | ICD-10-CM

## 2015-06-25 DIAGNOSIS — R05 Cough: Secondary | ICD-10-CM | POA: Diagnosis present

## 2015-06-25 DIAGNOSIS — Z7982 Long term (current) use of aspirin: Secondary | ICD-10-CM | POA: Insufficient documentation

## 2015-06-25 DIAGNOSIS — Z9981 Dependence on supplemental oxygen: Secondary | ICD-10-CM | POA: Insufficient documentation

## 2015-06-25 DIAGNOSIS — I10 Essential (primary) hypertension: Secondary | ICD-10-CM | POA: Insufficient documentation

## 2015-06-25 DIAGNOSIS — Z794 Long term (current) use of insulin: Secondary | ICD-10-CM | POA: Diagnosis not present

## 2015-06-25 DIAGNOSIS — Z872 Personal history of diseases of the skin and subcutaneous tissue: Secondary | ICD-10-CM | POA: Diagnosis not present

## 2015-06-25 LAB — CBC WITH DIFFERENTIAL/PLATELET
Basophils Absolute: 0 10*3/uL (ref 0.0–0.1)
Basophils Relative: 0 %
Eosinophils Absolute: 0 10*3/uL (ref 0.0–0.7)
Eosinophils Relative: 0 %
HCT: 30.3 % — ABNORMAL LOW (ref 36.0–46.0)
Hemoglobin: 9.9 g/dL — ABNORMAL LOW (ref 12.0–15.0)
Lymphocytes Relative: 11 %
Lymphs Abs: 1.1 10*3/uL (ref 0.7–4.0)
MCH: 28.1 pg (ref 26.0–34.0)
MCHC: 32.7 g/dL (ref 30.0–36.0)
MCV: 86.1 fL (ref 78.0–100.0)
Monocytes Absolute: 0.8 10*3/uL (ref 0.1–1.0)
Monocytes Relative: 8 %
Neutro Abs: 7.9 10*3/uL — ABNORMAL HIGH (ref 1.7–7.7)
Neutrophils Relative %: 81 %
Platelets: 156 10*3/uL (ref 150–400)
RBC: 3.52 MIL/uL — ABNORMAL LOW (ref 3.87–5.11)
RDW: 14.1 % (ref 11.5–15.5)
WBC: 9.7 10*3/uL (ref 4.0–10.5)

## 2015-06-25 LAB — I-STAT CG4 LACTIC ACID, ED: Lactic Acid, Venous: 0.67 mmol/L (ref 0.5–2.0)

## 2015-06-25 LAB — BASIC METABOLIC PANEL
Anion gap: 13 (ref 5–15)
BUN: 34 mg/dL — ABNORMAL HIGH (ref 6–20)
CO2: 23 mmol/L (ref 22–32)
Calcium: 9.1 mg/dL (ref 8.9–10.3)
Chloride: 102 mmol/L (ref 101–111)
Creatinine, Ser: 1.14 mg/dL — ABNORMAL HIGH (ref 0.44–1.00)
GFR calc Af Amer: 58 mL/min — ABNORMAL LOW (ref >60)
GFR calc non Af Amer: 50 mL/min — ABNORMAL LOW (ref >60)
Glucose, Bld: 143 mg/dL — ABNORMAL HIGH (ref 65–99)
Potassium: 3.7 mmol/L (ref 3.5–5.1)
Sodium: 138 mmol/L (ref 135–145)

## 2015-06-25 MED ORDER — IPRATROPIUM-ALBUTEROL 0.5-2.5 (3) MG/3ML IN SOLN
3.0000 mL | Freq: Once | RESPIRATORY_TRACT | Status: AC
  Administered 2015-06-25: 3 mL via RESPIRATORY_TRACT
  Filled 2015-06-25: qty 3

## 2015-06-25 MED ORDER — PREDNISONE 10 MG PO TABS: 20.0000 mg | ORAL_TABLET | Freq: Every day | ORAL | Status: DC

## 2015-06-25 MED ORDER — GUAIFENESIN ER 600 MG PO TB12
1200.0000 mg | ORAL_TABLET | Freq: Two times a day (BID) | ORAL | Status: DC
  Administered 2015-06-25: 1200 mg via ORAL
  Filled 2015-06-25: qty 2

## 2015-06-25 MED ORDER — LEVOFLOXACIN 500 MG PO TABS: 500.0000 mg | ORAL_TABLET | Freq: Every day | ORAL | Status: DC

## 2015-06-25 MED ORDER — METHYLPREDNISOLONE SODIUM SUCC 125 MG IJ SOLR
60.0000 mg | Freq: Once | INTRAMUSCULAR | Status: AC
  Administered 2015-06-25: 60 mg via INTRAVENOUS
  Filled 2015-06-25: qty 2

## 2015-06-25 MED ORDER — SODIUM CHLORIDE 0.9 % IV BOLUS (SEPSIS)
500.0000 mL | Freq: Once | INTRAVENOUS | Status: AC
  Administered 2015-06-25: 500 mL via INTRAVENOUS

## 2015-06-25 MED ORDER — BENZONATATE 100 MG PO CAPS
200.0000 mg | ORAL_CAPSULE | Freq: Three times a day (TID) | ORAL | Status: DC | PRN
  Administered 2015-06-25: 200 mg via ORAL
  Filled 2015-06-25: qty 2

## 2015-06-25 MED ORDER — BENZONATATE 100 MG PO CAPS: 100.0000 mg | ORAL_CAPSULE | Freq: Three times a day (TID) | ORAL | Status: DC

## 2015-06-25 MED ORDER — ALBUTEROL SULFATE (2.5 MG/3ML) 0.083% IN NEBU: 5.0000 mg | INHALATION_SOLUTION | Freq: Once | RESPIRATORY_TRACT | Status: DC

## 2015-06-25 MED ORDER — DEXTROSE 5 % IV SOLN
500.0000 mg | Freq: Once | INTRAVENOUS | Status: AC
  Administered 2015-06-25: 500 mg via INTRAVENOUS
  Filled 2015-06-25: qty 500

## 2015-06-25 NOTE — Discharge Instructions (Signed)
Discontinue Zithromax. Use your albuterol nebulizer every 4 hours. Take Levaquin and prednisone as prescribed. You're also given a prescription for Tessalon to use as needed for cough. Return if you are worse at any time. Otherwise, follow-up with Dr. Abner Greenspan later this week or next week.  Community-Acquired Pneumonia, Adult Pneumonia is an infection of the lungs. One type of pneumonia can happen while a person is in a hospital. A different type can happen when a person is not in a hospital (community-acquired pneumonia). It is easy for this kind to spread from person to person. It can spread to you if you breathe near an infected person who coughs or sneezes. Some symptoms include:  A dry cough.  A wet (productive) cough.  Fever.  Sweating.  Chest pain. HOME CARE  Take over-the-counter and prescription medicines only as told by your doctor.  Only take cough medicine if you are losing sleep.  If you were prescribed an antibiotic medicine, take it as told by your doctor. Do not stop taking the antibiotic even if you start to feel better.  Sleep with your head and neck raised (elevated). You can do this by putting a few pillows under your head, or you can sleep in a recliner.  Do not use tobacco products. These include cigarettes, chewing tobacco, and e-cigarettes. If you need help quitting, ask your doctor.  Drink enough water to keep your pee (urine) clear or pale yellow. A shot (vaccine) can help prevent pneumonia. Shots are often suggested for:  People older than 63 years of age.  People older than 63 years of age:  Who are having cancer treatment.  Who have long-term (chronic) lung disease.  Who have problems with their body's defense system (immune system). You may also prevent pneumonia if you take these actions:  Get the flu (influenza) shot every year.  Go to the dentist as often as told.  Wash your hands often. If soap and water are not available, use hand  sanitizer. GET HELP IF:  You have a fever.  You lose sleep because your cough medicine does not help. GET HELP RIGHT AWAY IF:  You are short of breath and it gets worse.  You have more chest pain.  Your sickness gets worse. This is very serious if:  You are an older adult.  Your body's defense system is weak.  You cough up blood.   This information is not intended to replace advice given to you by your health care provider. Make sure you discuss any questions you have with your health care provider.   Document Released: 08/25/2007 Document Revised: 11/27/2014 Document Reviewed: 07/03/2014 Elsevier Interactive Patient Education [employer].

## 2015-06-25 NOTE — Consult Note (Signed)
[facility] Medical Consultation  [name] [id number] DOB: [birth year] DOA: 06/25/2015 PCP: Jerlyn Ly, MD   Requesting physician: EDP Date of consultation: 06/25/15 Reason for consultation: Pneumonia Impression/Recommendations Active Problems:   Community acquired pneumonia   Right-sided chest pain   Nasal congestion  BP 146/60 mmHg  Pulse 83  Temp(Src) 98.8 F (37.1 C)  Resp 21  Ht 5\' 1"  (1.549 m)  Wt 104.327 kg (230 lb)  BMI 43.48 kg/m2  SpO2 100%  Community acquired pneumonia, in a patient with hx Asthma with exacerbation. Chest x-ray shows RLL infiltrate and small R pleural effusion.she receives Zithromax 500 mg IV, IV fluids and respiratory treatments, and Solumedrol to be given now125 mg/ 2l injection x1. Based on the Pneumonia Severity Index (PSI)  of 63, risk class of 2, she meets the criteria for outpatient management, advancing to Levaquin 750 po q d x 5 days. Other supportive therapies include:  Tessalon 200 mg 3 times a day when necessary for cough Mucinex 1200 mg by mouth every 12 hours Albuterol nebulizers every 4 hours for the next 3-4 days Continue prednisone recommending 40 mg daily for 2-4 days If symptoms do not improve or worsen, recommended that she returns for further evaluation. Case was discussed with the patient who agrees with the current plan of care.   Right Pleuritic Chest pain with productive cough, due to CAP  CXR without signs of abscess or PE Supportive therapy with steroids, cough meds and pain meds as above  Nasal congestion likely due to above symptoms. She does admit to intermittent decongestant use.  Should improve with Levaquin, recommend saline nasal spray and follow up with PCP   Other medical issues as per PCP   HPI:  65 year old woman with a history of asthma among other medical issues, presenting today as instructed by PCP due to worsening respiratory symptoms. In review, she begun to experience wheezing and cough 1  week ago. On Sunday, she developed more nasal congestion, and fever up to 102.8, with worsening cough. She started a course of steroids, and presented to her PCP office on Monday, at which time she received oral Z pack  and Tamiflu despite negative results of her screening tests. While initially she felt better, with resolution of fever, she began to develop right-sided chest pain, with dyspnea. Her cough was productive, with intermittent blood tinged sputum. Her cough is forceful.chest x-ray was remarkable for right sided pneumonia, without abscess and without suspicion for PE. On presentation, she received IM Rocephin 1, IV fluids and respiratory therapy. Currently, she is receiving IV Solu-Medrol as well. She is not aware of sick contacts. She reports some nasal congetion with blood tinged mucus at times and some sinus headaches. She denies any nausea, vomiting, chills, abdominal pain or diarrhea. ER physiscians requested consultation on this patient, with recommendations regarding her respiratory issues  Review of Systems:  A full 10 point Review of Systems was done, except as stated above, all other Review of Systems were negative.   Past Medical History  Diagnosis Date  . Urticaria   . Fibromyalgia   . Tachycardia   . DM (diabetes mellitus) ([place])   . Hyperlipidemia   . Bell's palsy   . Hypertension   . Hypothyroidism   . Obesity   . UTI (urinary tract infection)   . Nephrolithiasis   . Anemia   . Carbuncle and furuncle of trunk   . Fracture MAY 2014    HX OF FRACTURED NECK C1- CAUSES  SEVERE HEADACHES--LIMITED ROM NECK  . Disorder of fascia     HX OF NECROTIC FASCITIS AFTER ABDOMINAL SURGERY FOR HERNIA- REQUIRED 19 SURGERIES AND 2.5 MONTH HOSPITALIZATION AT [facility]  . Pain     LEFT SHOULDER  PAIN -HARD TO LIE ON LEFT SIDE FOR LONG PERIOD;  PAIN IN LOWER BADK - 3 HERNIATED DISCS AND STENOISIS -  . Elevated cholesterol   . Heart murmur     DOES NOT CAUSE ANY PROBLEMS  . Dysrhythmia      HX OF TACHYCARDIA AND BRADYCARDIA - ON GOING FOR YEARS - DOES NOT HAVE TO SEE CARDIOLOGIST  . Asthma     OCCAS  . Migraine   . Arthritis   . Urinary frequency   . Restless leg syndrome   . OSA (obstructive sleep apnea)     USES CPAP - DOES NOT KNOW SETTING  . Frequent infections     ESPECIALLY PRONE TO INFECTIONS AFTER SURGERIES  . PONV (postoperative nausea and vomiting)     THE GAS MAKES ME NAUSEATED   Past Surgical History  Procedure Laterality Date  . Appendectomy    . Abdominal hysterectomy    . Cholecystectomy    . Cesarean section      X 3  . Carpal tunnel release      BILATERAL  . Knee arthroscopy  RIGHT AND LEFT    X 2  . Shoulder arthroscopy Right     X 2  . Laparoscopic gastric sleeve resection  2014  . Hernia repair      ABDOMINAL HERNIA REPAIR WITH MESH - 3 SURGERIES   . Joint replacement      TOTAL RIGHT KNEE REPLACEMENT  . Right foot drainage of infection    . I&d of infected site in belly - from an injection    . Tonsillectomy      AND ADENOIDECTOMY  . Hx of 19 surgeries for necrotic fascitis    . Cystoscopy with ureteroscopy Right 12/03/2013    Procedure: CYSTOSCOPY WITH RIGHT RETROGRADE URETEROSCOPY LASER LITHOTRIPSY RIGHT STONE RIGHT URETERAL STENT, ;  Surgeon: Raynelle Bring, MD;  Location: [facility];  Service: Urology;  Laterality: Right;  PROCEDURE WAS ORIGINALLY SCHEDULED AS RIGHT PERCUTANEOUS NEPHROLITHOTOMY  . Nephrolithotomy Right 12/10/2013    Procedure: NEPHROLITHOTOMY PERCUTANEOUS;  Surgeon: Raynelle Bring, MD;  Location: [facility];  Service: Urology;  Laterality: Right;   Social History:  reports that she has never smoked. She has never used smokeless tobacco. She reports that she does not drink alcohol or use illicit drugs.  Allergies  Allergen Reactions  . Codeine Nausea Only  . Imitrex [Sumatriptan] Other (See Comments)    Facial spasms  . Morphine Other (See Comments)    Severe headaches   . Nsaids Other (See Comments)    PT UNABLE TO  TOLERATE NSAID'S DRUGS DUE TO HX OF GASTRIC SLEEVE SURGERY  . Promethazine Hcl Other (See Comments)    Restless leg feeling all over body  . Statins Other (See Comments)    Leg/muscle pain  . Sulfonamide Derivatives Hives  . Vicodin [Hydrocodone-Acetaminophen] Nausea And Vomiting   Family History  Problem Relation Age of Onset  . Diabetes Father   . Osteoarthritis Father   . Heart disease Father   . Ulcers Father   . Stroke Mother     Prior to Admission medications   Medication Sig Start Date End Date Taking? Authorizing Provider  albuterol (PROVENTIL HFA;VENTOLIN HFA) 108 (90 BASE) MCG/ACT inhaler Inhale 2 puffs  into the lungs every 6 (six) hours as needed for wheezing or shortness of breath. [place] Provider, MD  aspirin EC 81 MG tablet Take 81 mg by mouth at bedtime.    Historical Provider, MD  b complex vitamins tablet Take 1 tablet by mouth daily.    Historical Provider, MD  cetirizine (ZYRTEC) 10 MG tablet Take 10 mg by mouth See admin instructions. Take 1 tablet (10 mg) twice daily during allergy season    Historical Provider, MD  Chlorpheniramine Maleate (CHLOR-TRIMETON PO) Take 1 tablet by mouth See admin instructions. Take 1 tablet four times daily during allergy season    Historical Provider, MD  clonazePAM (KLONOPIN) 0.5 MG tablet Take 0.5 mg by mouth 3 (three) times daily.  07/01/14   Historical Provider, MD  cyclobenzaprine (FLEXERIL) 10 MG tablet Take 10 mg by mouth See admin instructions. Take 1 tablet (10 mg) every night at bedtime, may take an additional tablet during the day as needed for muscle spasms 07/15/14   Historical Provider, MD  dextromethorphan-guaiFENesin (MUCINEX DM) 30-600 MG per 12 hr tablet Take 1 tablet by mouth 2 (two) times daily as needed for cough (congestion).    Historical Provider, MD  diclofenac sodium (VOLTAREN) 1 % GEL Apply 1 application topically 2 (two) times daily as needed (arthritis pain - hands).    Historical Provider, MD   erythromycin ophthalmic ointment Place 1 application into both eyes 2 (two) times daily. Started 07/18/14 for pink eye 07/18/14   Historical Provider, MD  fenofibrate 160 MG tablet Take 160 mg by mouth daily with supper.     Historical Provider, MD  ferrous sulfate 325 (65 FE) MG tablet Take 325 mg by mouth daily with breakfast.    Historical Provider, MD  FLUoxetine (PROZAC) 40 MG capsule Take 40 mg by mouth at bedtime.  07/14/14   Historical Provider, MD  Fluticasone Propionate (FLONASE ALLERGY RELIEF NA) Place 2 sprays into both nostrils See admin instructions. Use 2 sprays each nare daily at bedtime during allergy season    Historical Provider, MD  gabapentin (NEURONTIN) 100 MG capsule Take 100 mg by mouth 3 (three) times daily.    Historical Provider, MD  hydrochlorothiazide (MICROZIDE) 12.5 MG capsule Take 12.5 mg by mouth daily.  07/01/14   Historical Provider, MD  insulin aspart (NOVOLOG) 100 UNIT/ML injection Inject 5-25 Units into the skin 3 (three) times daily as needed for high blood sugar (CBG >120). Per sliding scale    Historical Provider, MD  insulin detemir (LEVEMIR) 100 UNIT/ML injection Inject 0.1 mLs (10 Units total) into the skin 2 (two) times daily. 07/21/14   Velvet Bathe, MD  levothyroxine (SYNTHROID, LEVOTHROID) 75 MCG tablet Take 75 mcg by mouth See admin instructions. Take 1 tablet (75 mcg) every morning before breakfast except Mondays (Do not take on Mondays)    Historical Provider, MD  metoprolol (LOPRESSOR) 50 MG tablet Take 50 mg by mouth 3 (three) times daily.  06/28/14   Historical Provider, MD  mometasone (ASMANEX) 220 MCG/INH inhaler Inhale 1 puff into the lungs 2 (two) times daily.    Historical Provider, MD  montelukast (SINGULAIR) 10 MG tablet Take 10 mg by mouth daily.     Historical Provider, MD  Multiple Vitamins-Minerals (HAIR/SKIN/NAILS/BIOTIN) TABS Take 1 tablet by mouth daily.    Historical Provider, MD  omeprazole (PRILOSEC) 20 MG capsule Take 20 mg by mouth  daily with supper.     Historical Provider, MD  ondansetron (ZOFRAN-ODT)  4 MG disintegrating tablet Take 4 mg by mouth daily as needed (nausea from pain medication).  06/03/14   Historical Provider, MD  oxyCODONE-acetaminophen (ROXICET) 5-325 MG per tablet Take 1 tablet by mouth every 4 (four) hours as needed for severe pain. Patient taking differently: Take 1 tablet by mouth daily as needed for severe pain.  12/10/13   Raynelle Bring, MD  Phentermine-Topiramate 11.25-69 MG CP24 Take 1 tablet by mouth daily.    Historical Provider, MD  potassium citrate (UROCIT-K) 10 MEQ (1080 MG) SR tablet Take 10 mEq by mouth 4 (four) times daily.  06/17/14   Historical Provider, MD  predniSONE (DELTASONE) 50 MG tablet Take 1 tablet by mouth daily 07/21/14   Velvet Bathe, MD  Probiotic Product (PROBIOTIC DAILY PO) Take 1 capsule by mouth daily.     Historical Provider, MD  rOPINIRole (REQUIP) 2 MG tablet Take 2 mg by mouth 4 (four) times daily.     Historical Provider, MD  zolpidem (AMBIEN) 5 MG tablet Take 5 mg by mouth at bedtime as needed for sleep.  06/12/14   Historical Provider, MD   Blood pressure 146/60, pulse 83, temperature 98.8 F (37.1 C), resp. rate 21, height 5\' 1"  (1.549 m), weight 104.327 kg (230 lb), SpO2 100 %.  Filed Vitals:   06/25/15 1404 06/25/15 1445  BP: 129/55 146/60  Pulse: 81 83  Temp:    Resp: 20 21   Physical Exam: General:  lying in bed in ill  appearing HEENT:  appear Normal, Conjunctivae clear, PERRLA. Moist Oral Mucosa. Neck: Supple Neck, No JVD, No cervical lymphadenopathy appriciated, No Carotid Bruits. Lung: Right rhonchi and wheezing, essentially clear on the left  Cardio: RRR, No Gallops, Rubs or Murmurs, No Parasternal Heave. Abdomen: Positive Bowel Sounds, Abdomen Soft, No tenderness, No organomegaly appreciated,No rebound -guarding or rigidity. Skin: No Cyanosis, Normal Skin Turgor, No Skin Rash or Bruise. Musculoskeletal: Good muscle tone, joints appear normal, no  effusions, Normal ROM. Lymph: No Palpable Lymph Nodes in Neck or Axillae Psych : Normal affect and insight, Not Suicidal or Homicidal, Awake Alert, Oriented X 3.  Neuro: No F.N deficits, al  Cranial Nerves Intact, Strength 5/5 all 4 extremities, Sensation intact all 4 extremities, Plantars down going.   Labs on Admission:  Basic Metabolic Panel:  Recent Labs Lab 06/25/15 1145  NA 138  K 3.7  CL 102  CO2 23  GLUCOSE 143*  BUN 34*  CREATININE 1.14*  CALCIUM 9.1   Liver Function Tests: No results for input(s): AST, ALT, ALKPHOS, BILITOT, PROT, ALBUMIN in the last 168 hours. No results for input(s): LIPASE, AMYLASE in the last 168 hours. No results for input(s): AMMONIA in the last 168 hours. CBC:  Recent Labs Lab 06/25/15 1145  WBC 9.7  NEUTROABS 7.9*  HGB 9.9*  HCT 30.3*  MCV 86.1  PLT 156   Cardiac Enzymes: No results for input(s): CKTOTAL, CKMB, CKMBINDEX, TROPONINI in the last 168 hours. BNP: Invalid input(s): POCBNP CBG: No results for input(s): GLUCAP in the last 168 hours.  Radiological Exams on Admission: Dg Chest 2 View  06/25/2015  CLINICAL DATA:  Shortness of breath. Right-sided chest and flank pain. EXAM: CHEST  2 VIEW COMPARISON:  07/20/2014 FINDINGS: Right lower lobe infiltrate and small right pleural effusion are seen which are new since prior exam. Left lung remains clear. Heart size is within normal limits. IMPRESSION: Right lower lobe infiltrate and small right pleural effusion. Recommend clinical correlation for signs or symptoms of pneumonia ;  consider followup PA and lateral chest X-ray in 3-4 weeks following trial of antibiotic therapy to ensure resolution/exclude underlying malignancy. Electronically Signed   By: Earle Gell M.D.   On: 06/25/2015 12:13       [facility]  If 7PM-7AM, please contact night-coverage [email or web address] Password [email or web address] 06/25/2015, 3:13 PM

## 2015-06-25 NOTE — ED Provider Notes (Signed)
CSN: [id number]     Arrival date & time 06/25/15  1117 History   First MD Initiated Contact with Patient 06/25/15 1234     Chief Complaint  Patient presents with  . Pneumonia  . Back Pain     (Consider location/radiation/quality/duration/timing/severity/associated sxs/prior Treatment) HPI Ms. steidel is a 65 year old morbidly obese insulin-dependent diabetic who presents today with right lower lobe pneumonia failed outpatient therapy. She states that she began having asthma symptoms and wheezing last week. On Sunday she began having more nasal congestion and fever to 102.8 with coughing. She started course of steroids that she had at home. She was seen in the office on Monday and had a flu test that was negative. She was started on Tamiflu and Zithromax. She states that the fever resolved Tuesday. Today she has worsening right-sided chest pain and dyspnea. She was seen in the office and received 1 g of Rocephin IM. She was sent from the office to the emergency department for further treatment and evaluation. Past Medical History  Diagnosis Date  . Urticaria   . Fibromyalgia   . Tachycardia   . DM (diabetes mellitus) ([facility] [facility])   . Hyperlipidemia   . Bell's palsy   . Hypertension   . Hypothyroidism   . Obesity   . UTI (urinary tract infection)   . Nephrolithiasis   . Anemia   . Carbuncle and furuncle of trunk   . Fracture MAY 2014    HX OF FRACTURED NECK C1- CAUSES SEVERE HEADACHES--LIMITED ROM NECK  . Disorder of fascia     HX OF NECROTIC FASCITIS AFTER ABDOMINAL SURGERY FOR HERNIA- REQUIRED 19 SURGERIES AND 2.5 MONTH HOSPITALIZATION AT [facility]  . Pain     LEFT SHOULDER  PAIN -HARD TO LIE ON LEFT SIDE FOR LONG PERIOD;  PAIN IN LOWER BADK - 3 HERNIATED DISCS AND STENOISIS -  . Elevated cholesterol   . Heart murmur     DOES NOT CAUSE ANY PROBLEMS  . Dysrhythmia     HX OF TACHYCARDIA AND BRADYCARDIA - ON GOING FOR YEARS - DOES NOT HAVE TO SEE CARDIOLOGIST  . Asthma     OCCAS  . Migraine    . Arthritis   . Urinary frequency   . Restless leg syndrome   . OSA (obstructive sleep apnea)     USES CPAP - DOES NOT KNOW SETTING  . Frequent infections     ESPECIALLY PRONE TO INFECTIONS AFTER SURGERIES  . PONV (postoperative nausea and vomiting)     THE GAS MAKES ME NAUSEATED   Past Surgical History  Procedure Laterality Date  . Appendectomy    . Abdominal hysterectomy    . Cholecystectomy    . Cesarean section      X 3  . Carpal tunnel release      BILATERAL  . Knee arthroscopy  RIGHT AND LEFT    X 2  . Shoulder arthroscopy Right     X 2  . Laparoscopic gastric sleeve resection  2014  . Hernia repair      ABDOMINAL HERNIA REPAIR WITH MESH - 3 SURGERIES   . Joint replacement      TOTAL RIGHT KNEE REPLACEMENT  . Right foot drainage of infection    . I&d of infected site in belly - from an injection    . Tonsillectomy      AND ADENOIDECTOMY  . Hx of 19 surgeries for necrotic fascitis    . Cystoscopy with ureteroscopy Right 12/03/2013  Procedure: CYSTOSCOPY WITH RIGHT RETROGRADE URETEROSCOPY LASER LITHOTRIPSY RIGHT STONE RIGHT URETERAL STENT, ;  Surgeon: Raynelle Bring, MD;  Location: [facility];  Service: Urology;  Laterality: Right;  PROCEDURE WAS ORIGINALLY SCHEDULED AS RIGHT PERCUTANEOUS NEPHROLITHOTOMY  . Nephrolithotomy Right 12/10/2013    Procedure: NEPHROLITHOTOMY PERCUTANEOUS;  Surgeon: Raynelle Bring, MD;  Location: [facility];  Service: Urology;  Laterality: Right;   Family History  Problem Relation Age of Onset  . Diabetes Father   . Osteoarthritis Father   . Heart disease Father   . Ulcers Father   . Stroke Mother    Social History  Substance Use Topics  . Smoking status: Never Smoker   . Smokeless tobacco: Never Used  . Alcohol Use: No   OB History    No data available     Review of Systems  All other systems reviewed and are negative.     Allergies  Codeine; Imitrex; Morphine; Nsaids; Promethazine hcl; Statins; Sulfonamide derivatives; and  Vicodin  Home Medications   Prior to Admission medications   Medication Sig Start Date End Date Taking? Authorizing Provider  albuterol (PROVENTIL HFA;VENTOLIN HFA) 108 (90 BASE) MCG/ACT inhaler Inhale 2 puffs into the lungs every 6 (six) hours as needed for wheezing or shortness of breath. Clancy Provider, MD  aspirin EC 81 MG tablet Take 81 mg by mouth at bedtime.    Historical Provider, MD  b complex vitamins tablet Take 1 tablet by mouth daily.    Historical Provider, MD  cetirizine (ZYRTEC) 10 MG tablet Take 10 mg by mouth See admin instructions. Take 1 tablet (10 mg) twice daily during allergy season    Historical Provider, MD  Chlorpheniramine Maleate (CHLOR-TRIMETON PO) Take 1 tablet by mouth See admin instructions. Take 1 tablet four times daily during allergy season    Historical Provider, MD  clonazePAM (KLONOPIN) 0.5 MG tablet Take 0.5 mg by mouth 3 (three) times daily.  07/01/14   Historical Provider, MD  cyclobenzaprine (FLEXERIL) 10 MG tablet Take 10 mg by mouth See admin instructions. Take 1 tablet (10 mg) every night at bedtime, may take an additional tablet during the day as needed for muscle spasms 07/15/14   Historical Provider, MD  dextromethorphan-guaiFENesin (MUCINEX DM) 30-600 MG per 12 hr tablet Take 1 tablet by mouth 2 (two) times daily as needed for cough (congestion).    Historical Provider, MD  diclofenac sodium (VOLTAREN) 1 % GEL Apply 1 application topically 2 (two) times daily as needed (arthritis pain - hands).    Historical Provider, MD  erythromycin ophthalmic ointment Place 1 application into both eyes 2 (two) times daily. Started 07/18/14 for pink eye 07/18/14   Historical Provider, MD  fenofibrate 160 MG tablet Take 160 mg by mouth daily with supper.     Historical Provider, MD  ferrous sulfate 325 (65 FE) MG tablet Take 325 mg by mouth daily with breakfast.    Historical Provider, MD  FLUoxetine (PROZAC) 40 MG capsule Take 40 mg by mouth at bedtime.   07/14/14   Historical Provider, MD  Fluticasone Propionate (FLONASE ALLERGY RELIEF NA) Place 2 sprays into both nostrils See admin instructions. Use 2 sprays each nare daily at bedtime during allergy season    Historical Provider, MD  gabapentin (NEURONTIN) 100 MG capsule Take 100 mg by mouth 3 (three) times daily.    Historical Provider, MD  hydrochlorothiazide (MICROZIDE) 12.5 MG capsule Take 12.5 mg by mouth daily.  07/01/14   Historical Provider, MD  insulin aspart (NOVOLOG) 100 UNIT/ML injection Inject 5-25 Units into the skin 3 (three) times daily as needed for high blood sugar (CBG >120). Per sliding scale    Historical Provider, MD  insulin detemir (LEVEMIR) 100 UNIT/ML injection Inject 0.1 mLs (10 Units total) into the skin 2 (two) times daily. 07/21/14   Velvet Bathe, MD  levothyroxine (SYNTHROID, LEVOTHROID) 75 MCG tablet Take 75 mcg by mouth See admin instructions. Take 1 tablet (75 mcg) every morning before breakfast except Mondays (Do not take on Mondays)    Historical Provider, MD  metoprolol (LOPRESSOR) 50 MG tablet Take 50 mg by mouth 3 (three) times daily.  06/28/14   Historical Provider, MD  mometasone (ASMANEX) 220 MCG/INH inhaler Inhale 1 puff into the lungs 2 (two) times daily.    Historical Provider, MD  montelukast (SINGULAIR) 10 MG tablet Take 10 mg by mouth daily.     Historical Provider, MD  Multiple Vitamins-Minerals (HAIR/SKIN/NAILS/BIOTIN) TABS Take 1 tablet by mouth daily.    Historical Provider, MD  omeprazole (PRILOSEC) 20 MG capsule Take 20 mg by mouth daily with supper.     Historical Provider, MD  ondansetron (ZOFRAN-ODT) 4 MG disintegrating tablet Take 4 mg by mouth daily as needed (nausea from pain medication).  06/03/14   Historical Provider, MD  oxyCODONE-acetaminophen (ROXICET) 5-325 MG per tablet Take 1 tablet by mouth every 4 (four) hours as needed for severe pain. Patient taking differently: Take 1 tablet by mouth daily as needed for severe pain.  12/10/13   Raynelle Bring, MD  Phentermine-Topiramate 11.25-69 MG CP24 Take 1 tablet by mouth daily.    Historical Provider, MD  potassium citrate (UROCIT-K) 10 MEQ (1080 MG) SR tablet Take 10 mEq by mouth 4 (four) times daily.  06/17/14   Historical Provider, MD  predniSONE (DELTASONE) 50 MG tablet Take 1 tablet by mouth daily 07/21/14   Velvet Bathe, MD  Probiotic Product (PROBIOTIC DAILY PO) Take 1 capsule by mouth daily.     Historical Provider, MD  rOPINIRole (REQUIP) 2 MG tablet Take 2 mg by mouth 4 (four) times daily.     Historical Provider, MD  zolpidem (AMBIEN) 5 MG tablet Take 5 mg by mouth at bedtime as needed for sleep.  06/12/14   Historical Provider, MD   BP 127/53 mmHg  Pulse 83  Temp(Src) 98.8 F (37.1 C)  Resp 21  Ht 5\' 1"  (1.549 m)  Wt 104.327 kg  BMI 43.48 kg/m2  SpO2 91% Physical Exam  Constitutional: She is oriented to person, place, and time. She appears well-developed and well-nourished. No distress.  Morbidly obese  HENT:  Head: Normocephalic and atraumatic.  Right Ear: External ear normal.  Left Ear: External ear normal.  Nose: Nose normal.  Mouth/Throat: Oropharynx is clear and moist.  Eyes: Conjunctivae are normal. Pupils are equal, round, and reactive to light.  Neck: Normal range of motion.  Cardiovascular: Normal rate and regular rhythm.   Pulmonary/Chest: Effort normal.  Rales right base. Some diffuse rhonchi bilateral bases.  Abdominal: Soft. Bowel sounds are normal.  Musculoskeletal: Normal range of motion. She exhibits no edema or tenderness.  Neurological: She is alert and oriented to person, place, and time.  Skin: Skin is warm and dry.  Psychiatric: She has a normal mood and affect.  Nursing note and vitals reviewed.   ED Course  Procedures (including critical care time) Labs Review Labs Reviewed  CBC WITH DIFFERENTIAL/PLATELET - Abnormal; Notable for the following:    RBC 3.52 (*)  Hemoglobin 9.9 (*)    HCT 30.3 (*)    Neutro Abs 7.9 (*)    All other  components within normal limits  BASIC METABOLIC PANEL - Abnormal; Notable for the following:    Glucose, Bld 143 (*)    BUN 34 (*)    Creatinine, Ser 1.14 (*)    GFR calc non Af Amer 50 (*)    GFR calc Af Amer 58 (*)    All other components within normal limits  I-STAT CG4 LACTIC ACID, ED    Imaging Review Dg Chest 2 View  06/25/2015  CLINICAL DATA:  Shortness of breath. Right-sided chest and flank pain. EXAM: CHEST  2 VIEW COMPARISON:  07/20/2014 FINDINGS: Right lower lobe infiltrate and small right pleural effusion are seen which are new since prior exam. Left lung remains clear. Heart size is within normal limits. IMPRESSION: Right lower lobe infiltrate and small right pleural effusion. Recommend clinical correlation for signs or symptoms of pneumonia ; consider followup PA and lateral chest X-Cleola Perryman in 3-4 weeks following trial of antibiotic therapy to ensure resolution/exclude underlying malignancy. Electronically Signed   By: Earle Gell M.D.   On: 06/25/2015 12:13   I have personally reviewed and evaluated these images and lab results as part of my medical decision-making.   EKG Interpretation   Date/Time:  Wednesday June 25 2015 14:01:18 EDT Ventricular Rate:  82 PR Interval:  144 QRS Duration: 96 QT Interval:  397 QTC Calculation: 464 R Axis:   82 Text Interpretation:  Sinus rhythm Borderline right axis deviation  Baseline wander in lead(s) V2 Confirmed by Sheryl Towell MD, Andee Poles [id number]) on  06/25/2015 3:08:35 PM      MDM   Final diagnoses:  CAP (community acquired pneumonia)    65 year old insulin-dependent diabetic who has failed outpatient therapy for community-acquired pneumonia. She is alert by an hypoxic with sats here 90% on room air. She is placed on oxygen. She has received IM Rocephin today. She is also moderately anemic with hemoglobin of 9.9. She is given an IV fluid bolus 500 mL and Zithromax 500 mg IV. Plan observation.  Patient evaluated by hospitalist for  admission. I feel patient is stable for discharge and advised Levaquin, Tessalon, and prednisone with nebs every 4 hours. Patient is discharged home in improved condition.  Pattricia Boss, MD 06/25/15 [phone]

## 2015-06-25 NOTE — ED Notes (Signed)
Pt sent here from dr perrini's office due to right lower lobe pneumonia. Needs iv antibiotics and possible admission.

## 2015-06-25 NOTE — ED Notes (Signed)
Patient here with diagnosed pneumonia today after cxr. Received a dose of rocephin IM and has taken dose pack of steroids for same. Here for further treatment

## 2015-07-02 ENCOUNTER — Inpatient Hospital Stay (HOSPITAL_COMMUNITY)
Admission: EM | Admit: 2015-07-02 | Discharge: 2015-07-11 | DRG: 177 | Disposition: A | Discharge status: Home Health | Payer: 59 | Attending: Internal Medicine | Admitting: Internal Medicine

## 2015-07-02 ENCOUNTER — Emergency Department (HOSPITAL_COMMUNITY): Payer: 59

## 2015-07-02 ENCOUNTER — Encounter (HOSPITAL_COMMUNITY): Payer: Self-pay | Admitting: Family Medicine

## 2015-07-02 DIAGNOSIS — Z7982 Long term (current) use of aspirin: Secondary | ICD-10-CM

## 2015-07-02 DIAGNOSIS — I10 Essential (primary) hypertension: Secondary | ICD-10-CM | POA: Diagnosis present

## 2015-07-02 DIAGNOSIS — Z8249 Family history of ischemic heart disease and other diseases of the circulatory system: Secondary | ICD-10-CM

## 2015-07-02 DIAGNOSIS — G2581 Restless legs syndrome: Secondary | ICD-10-CM | POA: Diagnosis present

## 2015-07-02 DIAGNOSIS — R0602 Shortness of breath: Secondary | ICD-10-CM

## 2015-07-02 DIAGNOSIS — Z794 Long term (current) use of insulin: Secondary | ICD-10-CM | POA: Diagnosis not present

## 2015-07-02 DIAGNOSIS — Z833 Family history of diabetes mellitus: Secondary | ICD-10-CM

## 2015-07-02 DIAGNOSIS — Z888 Allergy status to other drugs, medicaments and biological substances status: Secondary | ICD-10-CM

## 2015-07-02 DIAGNOSIS — J9600 Acute respiratory failure, unspecified whether with hypoxia or hypercapnia: Secondary | ICD-10-CM

## 2015-07-02 DIAGNOSIS — E785 Hyperlipidemia, unspecified: Secondary | ICD-10-CM | POA: Diagnosis present

## 2015-07-02 DIAGNOSIS — Z9109 Other allergy status, other than to drugs and biological substances: Secondary | ICD-10-CM

## 2015-07-02 DIAGNOSIS — Z823 Family history of stroke: Secondary | ICD-10-CM | POA: Diagnosis not present

## 2015-07-02 DIAGNOSIS — J869 Pyothorax without fistula: Principal | ICD-10-CM | POA: Diagnosis present

## 2015-07-02 DIAGNOSIS — J9601 Acute respiratory failure with hypoxia: Secondary | ICD-10-CM | POA: Insufficient documentation

## 2015-07-02 DIAGNOSIS — Z882 Allergy status to sulfonamides status: Secondary | ICD-10-CM

## 2015-07-02 DIAGNOSIS — Z6841 Body Mass Index (BMI) 40.0 and over, adult: Secondary | ICD-10-CM

## 2015-07-02 DIAGNOSIS — Z79899 Other long term (current) drug therapy: Secondary | ICD-10-CM | POA: Diagnosis not present

## 2015-07-02 DIAGNOSIS — Z886 Allergy status to analgesic agent status: Secondary | ICD-10-CM

## 2015-07-02 DIAGNOSIS — A4902 Methicillin resistant Staphylococcus aureus infection, unspecified site: Secondary | ICD-10-CM | POA: Diagnosis present

## 2015-07-02 DIAGNOSIS — J189 Pneumonia, unspecified organism: Secondary | ICD-10-CM | POA: Diagnosis not present

## 2015-07-02 DIAGNOSIS — M797 Fibromyalgia: Secondary | ICD-10-CM | POA: Diagnosis present

## 2015-07-02 DIAGNOSIS — J984 Other disorders of lung: Secondary | ICD-10-CM | POA: Diagnosis not present

## 2015-07-02 DIAGNOSIS — IMO0001 Reserved for inherently not codable concepts without codable children: Secondary | ICD-10-CM | POA: Insufficient documentation

## 2015-07-02 DIAGNOSIS — E039 Hypothyroidism, unspecified: Secondary | ICD-10-CM | POA: Diagnosis present

## 2015-07-02 DIAGNOSIS — G4733 Obstructive sleep apnea (adult) (pediatric): Secondary | ICD-10-CM | POA: Insufficient documentation

## 2015-07-02 DIAGNOSIS — Z7951 Long term (current) use of inhaled steroids: Secondary | ICD-10-CM | POA: Diagnosis not present

## 2015-07-02 DIAGNOSIS — J45901 Unspecified asthma with (acute) exacerbation: Secondary | ICD-10-CM | POA: Diagnosis present

## 2015-07-02 DIAGNOSIS — E119 Type 2 diabetes mellitus without complications: Secondary | ICD-10-CM | POA: Insufficient documentation

## 2015-07-02 DIAGNOSIS — Z9989 Dependence on other enabling machines and devices: Secondary | ICD-10-CM

## 2015-07-02 LAB — CBC
HCT: 31.7 % — ABNORMAL LOW (ref 36.0–46.0)
Hemoglobin: 10.2 g/dL — ABNORMAL LOW (ref 12.0–15.0)
MCH: 28.3 pg (ref 26.0–34.0)
MCHC: 32.2 g/dL (ref 30.0–36.0)
MCV: 88.1 fL (ref 78.0–100.0)
Platelets: 292 10*3/uL (ref 150–400)
RBC: 3.6 MIL/uL — ABNORMAL LOW (ref 3.87–5.11)
RDW: 14.7 % (ref 11.5–15.5)
WBC: 9.5 10*3/uL (ref 4.0–10.5)

## 2015-07-02 LAB — BASIC METABOLIC PANEL
Anion gap: 11 (ref 5–15)
BUN: 44 mg/dL — ABNORMAL HIGH (ref 6–20)
CO2: 27 mmol/L (ref 22–32)
Calcium: 9.7 mg/dL (ref 8.9–10.3)
Chloride: 99 mmol/L — ABNORMAL LOW (ref 101–111)
Creatinine, Ser: 1.11 mg/dL — ABNORMAL HIGH (ref 0.44–1.00)
GFR calc Af Amer: 60 mL/min — ABNORMAL LOW (ref >60)
GFR calc non Af Amer: 52 mL/min — ABNORMAL LOW (ref >60)
Glucose, Bld: 208 mg/dL — ABNORMAL HIGH (ref 65–99)
Potassium: 5 mmol/L (ref 3.5–5.1)
Sodium: 137 mmol/L (ref 135–145)

## 2015-07-02 LAB — GLUCOSE, CAPILLARY: Glucose-Capillary: 286 mg/dL — ABNORMAL HIGH (ref 65–99)

## 2015-07-02 LAB — I-STAT TROPONIN, ED: Troponin i, poc: 0 ng/mL (ref 0.00–0.08)

## 2015-07-02 LAB — I-STAT CG4 LACTIC ACID, ED: Lactic Acid, Venous: 0.73 mmol/L (ref 0.5–2.0)

## 2015-07-02 MED ORDER — CLONAZEPAM 0.5 MG PO TABS
0.5000 mg | ORAL_TABLET | Freq: Two times a day (BID) | ORAL | Status: DC
  Administered 2015-07-02 – 2015-07-11 (×18): 0.5 mg via ORAL
  Filled 2015-07-02 (×18): qty 1

## 2015-07-02 MED ORDER — FERROUS SULFATE 325 (65 FE) MG PO TABS
325.0000 mg | ORAL_TABLET | Freq: Every day | ORAL | Status: DC
  Administered 2015-07-03 – 2015-07-04 (×2): 325 mg via ORAL
  Filled 2015-07-02 (×2): qty 1

## 2015-07-02 MED ORDER — CLINDAMYCIN PHOSPHATE 600 MG/50ML IV SOLN
600.0000 mg | Freq: Three times a day (TID) | INTRAVENOUS | Status: DC
  Administered 2015-07-02 – 2015-07-03 (×4): 600 mg via INTRAVENOUS
  Filled 2015-07-02 (×4): qty 50

## 2015-07-02 MED ORDER — CYCLOBENZAPRINE HCL 10 MG PO TABS
10.0000 mg | ORAL_TABLET | Freq: Every day | ORAL | Status: DC
  Administered 2015-07-02 – 2015-07-10 (×9): 10 mg via ORAL
  Filled 2015-07-02 (×9): qty 1

## 2015-07-02 MED ORDER — GABAPENTIN 100 MG PO CAPS
100.0000 mg | ORAL_CAPSULE | Freq: Three times a day (TID) | ORAL | Status: DC
  Administered 2015-07-02 – 2015-07-11 (×26): 100 mg via ORAL
  Filled 2015-07-02 (×26): qty 1

## 2015-07-02 MED ORDER — INSULIN ASPART 100 UNIT/ML ~~LOC~~ SOLN: 0.0000 [IU] | SUBCUTANEOUS | Status: DC

## 2015-07-02 MED ORDER — FLUOXETINE HCL 20 MG PO CAPS
40.0000 mg | ORAL_CAPSULE | Freq: Every day | ORAL | Status: DC
  Administered 2015-07-02 – 2015-07-10 (×9): 40 mg via ORAL
  Filled 2015-07-02 (×9): qty 2

## 2015-07-02 MED ORDER — SODIUM CHLORIDE 0.9 % IV BOLUS (SEPSIS)
500.0000 mL | Freq: Once | INTRAVENOUS | Status: AC
  Administered 2015-07-02: 500 mL via INTRAVENOUS

## 2015-07-02 MED ORDER — ROPINIROLE HCL 1 MG PO TABS
2.0000 mg | ORAL_TABLET | Freq: Three times a day (TID) | ORAL | Status: DC
  Administered 2015-07-02 – 2015-07-11 (×26): 2 mg via ORAL
  Filled 2015-07-02 (×26): qty 2

## 2015-07-02 MED ORDER — ZOLPIDEM TARTRATE 5 MG PO TABS
5.0000 mg | ORAL_TABLET | Freq: Every evening | ORAL | Status: DC | PRN
  Administered 2015-07-10: 5 mg via ORAL
  Filled 2015-07-02: qty 1

## 2015-07-02 MED ORDER — IOPAMIDOL (ISOVUE-300) INJECTION 61%
INTRAVENOUS | Status: AC
  Administered 2015-07-02: 75 mL
  Filled 2015-07-02: qty 75

## 2015-07-02 MED ORDER — PANTOPRAZOLE SODIUM 40 MG PO TBEC
40.0000 mg | DELAYED_RELEASE_TABLET | Freq: Every day | ORAL | Status: DC
  Administered 2015-07-02 – 2015-07-11 (×10): 40 mg via ORAL
  Filled 2015-07-02 (×10): qty 1

## 2015-07-02 MED ORDER — VANCOMYCIN HCL 10 G IV SOLR
2000.0000 mg | Freq: Once | INTRAVENOUS | Status: AC
  Administered 2015-07-02: 2000 mg via INTRAVENOUS
  Filled 2015-07-02: qty 2000

## 2015-07-02 MED ORDER — PIPERACILLIN-TAZOBACTAM 3.375 G IVPB
3.3750 g | Freq: Once | INTRAVENOUS | Status: DC
  Administered 2015-07-02: 3.375 g via INTRAVENOUS
  Filled 2015-07-02: qty 50

## 2015-07-02 MED ORDER — INSULIN ASPART 100 UNIT/ML ~~LOC~~ SOLN
0.0000 [IU] | Freq: Three times a day (TID) | SUBCUTANEOUS | Status: DC
  Administered 2015-07-03: 8 [IU] via SUBCUTANEOUS
  Administered 2015-07-03: 3 [IU] via SUBCUTANEOUS
  Administered 2015-07-03: 5 [IU] via SUBCUTANEOUS
  Administered 2015-07-04 (×2): 3 [IU] via SUBCUTANEOUS
  Administered 2015-07-04 – 2015-07-05 (×2): 5 [IU] via SUBCUTANEOUS
  Administered 2015-07-05 – 2015-07-06 (×2): 3 [IU] via SUBCUTANEOUS
  Administered 2015-07-06 (×2): 5 [IU] via SUBCUTANEOUS
  Administered 2015-07-06: 11 [IU] via SUBCUTANEOUS
  Administered 2015-07-07: 3 [IU] via SUBCUTANEOUS
  Administered 2015-07-07: 5 [IU] via SUBCUTANEOUS
  Administered 2015-07-08: 8 [IU] via SUBCUTANEOUS
  Administered 2015-07-08: 3 [IU] via SUBCUTANEOUS
  Administered 2015-07-08 (×2): 5 [IU] via SUBCUTANEOUS
  Administered 2015-07-09: 2 [IU] via SUBCUTANEOUS
  Administered 2015-07-09: 5 [IU] via SUBCUTANEOUS
  Administered 2015-07-09: 3 [IU] via SUBCUTANEOUS
  Administered 2015-07-09: 2 [IU] via SUBCUTANEOUS
  Administered 2015-07-10: 8 [IU] via SUBCUTANEOUS
  Administered 2015-07-10 (×2): 5 [IU] via SUBCUTANEOUS
  Administered 2015-07-10: 2 [IU] via SUBCUTANEOUS
  Administered 2015-07-11: 8 [IU] via SUBCUTANEOUS

## 2015-07-02 MED ORDER — DEXTROSE 5 % IV SOLN
2.0000 g | Freq: Three times a day (TID) | INTRAVENOUS | Status: DC
  Administered 2015-07-02 – 2015-07-07 (×15): 2 g via INTRAVENOUS
  Filled 2015-07-02 (×17): qty 2

## 2015-07-02 MED ORDER — ALBUTEROL SULFATE (2.5 MG/3ML) 0.083% IN NEBU
5.0000 mg | INHALATION_SOLUTION | Freq: Once | RESPIRATORY_TRACT | Status: AC
Start: 2015-07-02 — End: 2015-07-02
  Administered 2015-07-02: 5 mg via RESPIRATORY_TRACT
  Filled 2015-07-02: qty 6

## 2015-07-02 MED ORDER — SODIUM CHLORIDE 0.9 % IV SOLN
1250.0000 mg | INTRAVENOUS | Status: DC
  Administered 2015-07-03 – 2015-07-06 (×4): 1250 mg via INTRAVENOUS
  Filled 2015-07-02 (×4): qty 1250

## 2015-07-02 MED ORDER — INSULIN DETEMIR 100 UNIT/ML ~~LOC~~ SOLN
25.0000 [IU] | Freq: Two times a day (BID) | SUBCUTANEOUS | Status: DC
  Administered 2015-07-02 – 2015-07-10 (×16): 25 [IU] via SUBCUTANEOUS
  Filled 2015-07-02 (×18): qty 0.25

## 2015-07-02 MED ORDER — VANCOMYCIN HCL IN DEXTROSE 1-5 GM/200ML-% IV SOLN: 1000.0000 mg | Freq: Once | INTRAVENOUS | Status: DC

## 2015-07-02 MED ORDER — PREDNISONE 20 MG PO TABS
60.0000 mg | ORAL_TABLET | Freq: Once | ORAL | Status: AC
  Administered 2015-07-02: 60 mg via ORAL
  Filled 2015-07-02: qty 3

## 2015-07-02 MED ORDER — SODIUM CHLORIDE 0.9 % IV SOLN
INTRAVENOUS | Status: AC
  Administered 2015-07-02: 125 mL/h via INTRAVENOUS

## 2015-07-02 MED ORDER — IPRATROPIUM BROMIDE 0.02 % IN SOLN
0.5000 mg | Freq: Once | RESPIRATORY_TRACT | Status: AC
Start: 2015-07-02 — End: 2015-07-02
  Administered 2015-07-02: 0.5 mg via RESPIRATORY_TRACT
  Filled 2015-07-02: qty 2.5

## 2015-07-02 MED ORDER — METOPROLOL TARTRATE 50 MG PO TABS
50.0000 mg | ORAL_TABLET | Freq: Two times a day (BID) | ORAL | Status: DC
  Administered 2015-07-02 – 2015-07-11 (×18): 50 mg via ORAL
  Filled 2015-07-02 (×18): qty 1

## 2015-07-02 NOTE — ED Notes (Signed)
Pt voids 800 cc to [facility].

## 2015-07-02 NOTE — Progress Notes (Signed)
Pharmacy Antibiotic Note  [name] is a 65 y.o. female admitted on 07/02/2015 with pneumonia with possible empyema.  Presents with increased SOB and fatigue, recently finished levofloxacin for PNA.  PMH includes asthma, OSA, T2DM, Anemia.  Originally ordered for Zosyn, but not started.  Pharmacy has been consulted for Vancomycin, Ceftazidime, and Clindamycin dosing.  On Admit: Afebrile, HR 66, RR 17, WBC 9.5 Hypotensive (down to 89/36), LA 0.73  Plan: --Vancomycin 2000 mg IV x 1, then 1250 mg IV q24h --Ceftazidime 2 g IV q8h --Clindamycin 600 mg IV q8h --Obtain a vanc trough at steady state --Follow renal function, clinical course, and cultures     Temp (24hrs), Avg:98.7 F (37.1 C), Min:98.7 F (37.1 C), Max:98.7 F (37.1 C)   Recent Labs Lab 07/02/15 1105 07/02/15 1114  WBC 9.5  --   CREATININE 1.11*  --   LATICACIDVEN  --  0.73    Estimated Creatinine Clearance: 57.7 mL/min (by C-G formula based on Cr of 1.11).    Allergies  Allergen Reactions  . Codeine Nausea Only  . Imitrex [Sumatriptan] Other (See Comments)    Facial spasms  . Morphine Other (See Comments)    Severe headaches   . Nsaids Other (See Comments)    PT UNABLE TO TOLERATE NSAID'S DRUGS DUE TO HX OF GASTRIC SLEEVE SURGERY  . Promethazine Hcl Other (See Comments)    Restless leg feeling all over body  . Statins Other (See Comments)    Leg/muscle pain  . Sulfonamide Derivatives Hives  . Vicodin [Hydrocodone-Acetaminophen] Nausea And Vomiting    Antimicrobials this admission: PTA Levofloxacin 4/12 Ceftazidime >>  4/12 Clindamycin >> 4/12 Vancomycin >>  Dose adjustments this admission:   Microbiology results: none  Thank you for allowing pharmacy to be a part of this patient's care.  Viann Fish 07/02/2015 5:11 PM

## 2015-07-02 NOTE — ED Notes (Signed)
Pt here with increased SOB and fatigue. sts recently dx with PNA and just finished treatment today. sts not spitting up blood anymore but still crackles in lungs. sts she feels like there is a knot in her chest.

## 2015-07-02 NOTE — ED Notes (Signed)
Patient transported to CT 

## 2015-07-02 NOTE — Consult Note (Signed)
PULMONARY / CRITICAL CARE MEDICINE   Name: DHANVI BUCKMAN MRN: [id number] DOB: 10-May-1950    ADMISSION DATE:  07/02/2015  REFERRING MD:  EDP  CHIEF COMPLAINT:  Empyema   HISTORY OF PRESENT ILLNESS:   65yo female with hx asthma, DM, HTN, OSA, recently treated as outpt for PNA with levaquin (last dose day of admission) presented to ER 4/12 with 2 days worsening fatigue, malaise, SOB.  CT chest revealed dense RLL PNA with questionable empyema.  Lactate negative, afebrile, VSS.  PCCM called to admit.   Per pt she initially felt significant improvement once started on levaquin.  Over last 2 days has had increased fatigue, cough, malaise, dyspnea.  Denies fevers, chills, hemoptysis, BLE edema.    PAST MEDICAL HISTORY :  She  has a past medical history of Urticaria; Fibromyalgia; Tachycardia; DM (diabetes mellitus) ([place]); Hyperlipidemia; Bell's palsy; Hypertension; Hypothyroidism; Obesity; UTI (urinary tract infection); Nephrolithiasis; Anemia; Carbuncle and furuncle of trunk; Fracture (MAY 2014); Disorder of fascia; Pain; Elevated cholesterol; Heart murmur; Dysrhythmia; Asthma; Migraine; Arthritis; Urinary frequency; Restless leg syndrome; OSA (obstructive sleep apnea); Frequent infections; and PONV (postoperative nausea and vomiting).  PAST SURGICAL HISTORY: She  has past surgical history that includes Appendectomy; Abdominal hysterectomy; Cholecystectomy; Cesarean section; Carpal tunnel release; Knee arthroscopy (RIGHT AND LEFT); shoulder arthroscopy (Right); Laparoscopic gastric sleeve resection (2014); Hernia repair; Joint replacement; RIGHT FOOT DRAINAGE OF INFECTION; I&D OF INFECTED SITE IN [place] - FROM AN INJECTION; Tonsillectomy; HX OF 19 SURGERIES FOR NECROTIC FASCITIS; Cystoscopy with ureteroscopy (Right, 12/03/2013); and Nephrolithotomy (Right, 12/10/2013).  Allergies  Allergen Reactions  . Codeine Nausea Only  . Imitrex [Sumatriptan] Other (See Comments)    Facial spasms  . Morphine  Other (See Comments)    Severe headaches   . Nsaids Other (See Comments)    PT UNABLE TO TOLERATE NSAID'S DRUGS DUE TO HX OF GASTRIC SLEEVE SURGERY  . Promethazine Hcl Other (See Comments)    Restless leg feeling all over body  . Statins Other (See Comments)    Leg/muscle pain  . Sulfonamide Derivatives Hives  . Vicodin [Hydrocodone-Acetaminophen] Nausea And Vomiting    No current facility-administered medications on file prior to encounter.   Current Outpatient Prescriptions on File Prior to Encounter  Medication Sig  . albuterol (PROVENTIL HFA;VENTOLIN HFA) 108 (90 BASE) MCG/ACT inhaler Inhale 2 puffs into the lungs every 6 (six) hours as needed for wheezing or shortness of breath. PROAIR  . aspirin EC 81 MG tablet Take 81 mg by mouth at bedtime.  Marland Kitchen b complex vitamins tablet Take 1 tablet by mouth daily.  . benzonatate (TESSALON) 100 MG capsule Take 1 capsule (100 mg total) by mouth every 8 (eight) hours. (Patient taking differently: Take 100 mg by mouth 3 (three) times daily as needed for cough. )  . cetirizine (ZYRTEC) 10 MG tablet Take 10 mg by mouth See admin instructions. Take 1 tablet (10 mg) twice daily during allergy season  . Chlorpheniramine Maleate (CHLOR-TRIMETON PO) Take 1 tablet by mouth See admin instructions. Take 1 tablet four times daily during allergy season  . clonazePAM (KLONOPIN) 0.5 MG tablet Take 0.5 mg by mouth 3 (three) times daily.   . cyclobenzaprine (FLEXERIL) 10 MG tablet Take 10 mg by mouth See admin instructions. Take 1 tablet (10 mg) every night at bedtime, may take an additional tablet during the day as needed for muscle spasms  . dextromethorphan-guaiFENesin (MUCINEX DM) 30-600 MG per 12 hr tablet Take 1 tablet by mouth 2 (two) times  daily as needed for cough (congestion).  Marland Kitchen diclofenac sodium (VOLTAREN) 1 % GEL Apply 1 application topically 2 (two) times daily as needed (arthritis pain - hands).  . fenofibrate 160 MG tablet Take 160 mg by mouth daily  with supper.   . ferrous sulfate 325 (65 FE) MG tablet Take 325 mg by mouth daily with breakfast.  . FLUoxetine (PROZAC) 40 MG capsule Take 40 mg by mouth at bedtime.   . Fluticasone Propionate (FLONASE ALLERGY RELIEF NA) Place 2 sprays into both nostrils See admin instructions. Use 2 sprays each nare daily at bedtime during allergy season  . gabapentin (NEURONTIN) 100 MG capsule Take 100 mg by mouth 3 (three) times daily.  . hydrochlorothiazide (MICROZIDE) 12.5 MG capsule Take 12.5 mg by mouth daily.   . insulin aspart (NOVOLOG) 100 UNIT/ML injection Inject 5-25 Units into the skin 3 (three) times daily as needed for high blood sugar (CBG >120). Per sliding scale  . insulin detemir (LEVEMIR) 100 UNIT/ML injection Inject 0.1 mLs (10 Units total) into the skin 2 (two) times daily. (Patient taking differently: Inject 35 Units into the skin 2 (two) times daily. )  . levofloxacin (LEVAQUIN) 500 MG tablet Take 1 tablet (500 mg total) by mouth daily.  Marland Kitchen levothyroxine (SYNTHROID, LEVOTHROID) 75 MCG tablet Take 37.5 mcg by mouth See admin instructions. Take 4 times a week only. Sunday, Tuesday, Thursday, and Saturday.  . metoprolol (LOPRESSOR) 50 MG tablet Take 50 mg by mouth 3 (three) times daily.   . mometasone (ASMANEX) 220 MCG/INH inhaler Inhale 1 puff into the lungs 2 (two) times daily.  . montelukast (SINGULAIR) 10 MG tablet Take 10 mg by mouth daily.   . Multiple Vitamins-Minerals (HAIR/SKIN/NAILS/BIOTIN) TABS Take 1 tablet by mouth daily.  Marland Kitchen omeprazole (PRILOSEC) 20 MG capsule Take 20 mg by mouth daily with supper.   . ondansetron (ZOFRAN-ODT) 4 MG disintegrating tablet Take 4 mg by mouth daily as needed (nausea from pain medication).   . Phentermine-Topiramate 11.25-69 MG CP24 Take 1 tablet by mouth daily.  . Probiotic Product (PROBIOTIC DAILY PO) Take 1 capsule by mouth daily.   Marland Kitchen rOPINIRole (REQUIP) 2 MG tablet Take 2 mg by mouth 4 (four) times daily.   Marland Kitchen erythromycin ophthalmic ointment Place  1 application into both eyes 2 (two) times daily. Started 07/18/14 for pink eye  . oxyCODONE-acetaminophen (ROXICET) 5-325 MG per tablet Take 1 tablet by mouth every 4 (four) hours as needed for severe pain. (Patient not taking: Reported on 07/02/2015)  . potassium citrate (UROCIT-K) 10 MEQ (1080 MG) SR tablet Take 10 mEq by mouth 4 (four) times daily.   . predniSONE (DELTASONE) 10 MG tablet Take 2 tablets (20 mg total) by mouth daily. (Patient not taking: Reported on 07/02/2015)  . zolpidem (AMBIEN) 5 MG tablet Take 5 mg by mouth at bedtime as needed for sleep.     FAMILY HISTORY:  Her indicated that her mother is deceased. She indicated that her father is deceased.   SOCIAL HISTORY: She  reports that she has never smoked. She has never used smokeless tobacco. She reports that she does not drink alcohol or use illicit drugs.  REVIEW OF SYSTEMS:   As per HPI - All other systems reviewed and were neg.   SUBJECTIVE:   VITAL SIGNS: BP 125/44 mmHg  Pulse 78  Temp(Src) 98.7 F (37.1 C)  Resp 18  SpO2 97%  HEMODYNAMICS:    VENTILATOR SETTINGS:    INTAKE / OUTPUT:  PHYSICAL EXAMINATION: General:  Pleasant, obese female, NAD  Neuro:  Awake, alert, appropriate, MAE  HEENT:  Mm moist, no JVD  Cardiovascular:  S1s2, rrr Lungs:  resps even non labored  Abdomen:  Round, soft, non tender,  Musculoskeletal:  Warm and dry, no edema   LABS:  BMET  Recent Labs Lab 07/02/15 1105  NA 137  K 5.0  CL 99*  CO2 27  BUN 44*  CREATININE 1.11*  GLUCOSE 208*    Electrolytes  Recent Labs Lab 07/02/15 1105  CALCIUM 9.7    CBC  Recent Labs Lab 07/02/15 1105  WBC 9.5  HGB 10.2*  HCT 31.7*  PLT 292    Coag's No results for input(s): APTT, INR in the last 168 hours.  Sepsis Markers  Recent Labs Lab 07/02/15 1114  LATICACIDVEN 0.73    ABG No results for input(s): PHART, PCO2ART, PO2ART in the last 168 hours.  Liver Enzymes No results for input(s): AST, ALT,  ALKPHOS, BILITOT, ALBUMIN in the last 168 hours.  Cardiac Enzymes No results for input(s): TROPONINI, PROBNP in the last 168 hours.  Glucose No results for input(s): GLUCAP in the last 168 hours.  Imaging Dg Chest 2 View  07/02/2015  CLINICAL DATA:  Shortness of breath wheezing and chest pain. Recent pneumonia EXAM: CHEST  2 VIEW COMPARISON:  06/25/2015 FINDINGS: Loculated pleural fluid/ thickening on the right which is increased from prior. There is persistent lower lobe opacification which may reflect residual of the reported pneumonia. No cardiomegaly for technique. Negative aortic and hilar contours. IMPRESSION: Loculated right pleural effusion which has increased from 06/25/2015. Given recent history of pneumonia, consider CT to evaluate for complicated parapneumonic effusion. Electronically Signed   By: Monte Fantasia M.D.   On: 07/02/2015 12:24   Ct Chest W Contrast  07/02/2015  CLINICAL DATA:  Loculated pleural effusion EXAM: CT CHEST WITH CONTRAST TECHNIQUE: Multidetector CT imaging of the chest was performed during intravenous contrast administration. CONTRAST:  84mL ISOVUE-300 IOPAMIDOL (ISOVUE-300) INJECTION 61% COMPARISON:  None. FINDINGS: THORACIC INLET/BODY WALL: Goiter with calcified nodules projecting into the upper mediastinum. No chest wall inflammatory changes. MEDIASTINUM: Normal heart size. No pericardial effusion. No acute vascular abnormality. No adenopathy. LUNG WINDOWS: Dense consolidation in the right lower lobe with areas of central cavitation resulting in scattered gas bubbles. Small loculated pleural effusion around most of the right lower lobe with marked pleural thickening. No pneumothorax. No contralateral pneumonia. No pulmonary edema. UPPER ABDOMEN: Prominent splenic size, accentuated by positioning and stable from 2015 abdominal CT where seen. OSSEOUS: No acute fracture.  No suspicious lytic or blastic lesions. IMPRESSION: Cavitating right lower lobe pneumonia with  small but complex pleural effusion that is likely empyema. Electronically Signed   By: Monte Fantasia M.D.   On: 07/02/2015 15:38     STUDIES:  CT chest 4/12>>>dense RLL consolidation with central cavitation and associated complex pleural effusion which is likely empyema.   CULTURES: BC x 2 4/12>>> Sputum 4/12>>>  ANTIBIOTICS: Vanco 4/12>>> ceftaz 4/12>>> clinda 4/12>>>  SIGNIFICANT EVENTS:   LINES/TUBES:   DISCUSSION: 65yo female failed outpt rx for CAP now with dense RLL PNA and with ?empyema.   ASSESSMENT / PLAN:  CAP -- failed outpt rx  ?empyema -- unclear.  Small fluid pocket.  Looks great clinically.    PLAN -  Admit SDU triad Blood cultures  IV abx -- vanc, ceftaz, clindamycin  PRN BD  O2  pulm hygiene  Would have CVTS eval although doubt acute  indication for VATS, favor IV abx and monitor clinically  Avoid steroids   PCCM will f/u in am    Updates: pt and husband updated at length at bedside 4/12    Nickolas Madrid, NP 07/02/2015  5:02 PM Pager: [phone] or [phone]   STAFF NOTE: Linwood Dibbles, MD FACP have personally reviewed patient's available data, including medical history, events of note, physical examination and test results as part of my evaluation. I have discussed with resident/NP and other care providers such as pharmacist, RN and RRT. In addition, I personally evaluated patient and elicited key findings of: awake, no distress, does not look toxic, has ronchi crackles rt base, not tachy, pcxr and CT reviewed, small effusion with some loculation?, with infiltrates and necrotizing? Component, hope can avoid surgical intervention and chest tube, at this time thora would be high risk PTX and low yiled at this stage, this may change, obtain pcxr in am , add vanc, ceftaz, clinda ( toxin inh, necrotizing? / small abscesses), consider CVTS assessment, I dont see a clear indication for chest tube or surgical intervention at this stage  but she may decline, will follow and maintai IV ABX, pcxr in am , I update pt and husband  Lavon Paganini. Titus Mould, MD, [place] Pgr: [facility] 07/02/2015 5:36 PM

## 2015-07-02 NOTE — ED Provider Notes (Signed)
CSN: [id number]     Arrival date & time 07/02/15  29 History   First MD Initiated Contact with Patient 07/02/15 1144     Chief Complaint  Patient presents with  . Shortness of Breath     (Consider location/radiation/quality/duration/timing/severity/associated sxs/prior Treatment) HPI Comments: Patient is a 65 year old female with a history of asthma and recent pneumonia treated as an outpatient with Levaquin returning today for 2 days of worsening generalized fatigue and shortness of breath. Patient took her last dose of Levaquin today and prednisone yesterday and states she's feeling worse. She denies new fever. She tried taking a breathing treatment this morning without improvement. She denies nausea, vomiting or diarrhea. She denies any recent distal edema. No history of cardiac problems. She does take Toprol daily and took her doses today.  The history is provided by the patient.    Past Medical History  Diagnosis Date  . Urticaria   . Fibromyalgia   . Tachycardia   . DM (diabetes mellitus) ([place])   . Hyperlipidemia   . Bell's palsy   . Hypertension   . Hypothyroidism   . Obesity   . UTI (urinary tract infection)   . Nephrolithiasis   . Anemia   . Carbuncle and furuncle of trunk   . Fracture MAY 2014    HX OF FRACTURED NECK C1- CAUSES SEVERE HEADACHES--LIMITED ROM NECK  . Disorder of fascia     HX OF NECROTIC FASCITIS AFTER ABDOMINAL SURGERY FOR HERNIA- REQUIRED 19 SURGERIES AND 2.5 MONTH HOSPITALIZATION AT [facility]  . Pain     LEFT SHOULDER  PAIN -HARD TO LIE ON LEFT SIDE FOR LONG PERIOD;  PAIN IN LOWER BADK - 3 HERNIATED DISCS AND STENOISIS -  . Elevated cholesterol   . Heart murmur     DOES NOT CAUSE ANY PROBLEMS  . Dysrhythmia     HX OF TACHYCARDIA AND BRADYCARDIA - ON GOING FOR YEARS - DOES NOT HAVE TO SEE CARDIOLOGIST  . Asthma     OCCAS  . Migraine   . Arthritis   . Urinary frequency   . Restless leg syndrome   . OSA (obstructive sleep apnea)     USES CPAP -  DOES NOT KNOW SETTING  . Frequent infections     ESPECIALLY PRONE TO INFECTIONS AFTER SURGERIES  . PONV (postoperative nausea and vomiting)     THE GAS MAKES ME NAUSEATED   Past Surgical History  Procedure Laterality Date  . Appendectomy    . Abdominal hysterectomy    . Cholecystectomy    . Cesarean section      X 3  . Carpal tunnel release      BILATERAL  . Knee arthroscopy  RIGHT AND LEFT    X 2  . Shoulder arthroscopy Right     X 2  . Laparoscopic gastric sleeve resection  2014  . Hernia repair      ABDOMINAL HERNIA REPAIR WITH MESH - 3 SURGERIES   . Joint replacement      TOTAL RIGHT KNEE REPLACEMENT  . Right foot drainage of infection    . I&d of infected site in belly - from an injection    . Tonsillectomy      AND ADENOIDECTOMY  . Hx of 19 surgeries for necrotic fascitis    . Cystoscopy with ureteroscopy Right 12/03/2013    Procedure: CYSTOSCOPY WITH RIGHT RETROGRADE URETEROSCOPY LASER LITHOTRIPSY RIGHT STONE RIGHT URETERAL STENT, ;  Surgeon: Raynelle Bring, MD;  Location: [facility];  Service: Urology;  Laterality: Right;  PROCEDURE WAS ORIGINALLY SCHEDULED AS RIGHT PERCUTANEOUS NEPHROLITHOTOMY  . Nephrolithotomy Right 12/10/2013    Procedure: NEPHROLITHOTOMY PERCUTANEOUS;  Surgeon: Raynelle Bring, MD;  Location: [facility];  Service: Urology;  Laterality: Right;   Family History  Problem Relation Age of Onset  . Diabetes Father   . Osteoarthritis Father   . Heart disease Father   . Ulcers Father   . Stroke Mother    Social History  Substance Use Topics  . Smoking status: Never Smoker   . Smokeless tobacco: Never Used  . Alcohol Use: No   OB History    No data available     Review of Systems  All other systems reviewed and are negative.     Allergies  Codeine; Imitrex; Morphine; Nsaids; Promethazine hcl; Statins; Sulfonamide derivatives; and Vicodin  Home Medications   Prior to Admission medications   Medication Sig Start Date End Date Taking? Authorizing  Provider  albuterol (PROVENTIL HFA;VENTOLIN HFA) 108 (90 BASE) MCG/ACT inhaler Inhale 2 puffs into the lungs every 6 (six) hours as needed for wheezing or shortness of breath. [place] Provider, MD  aspirin EC 81 MG tablet Take 81 mg by mouth at bedtime.    Historical Provider, MD  b complex vitamins tablet Take 1 tablet by mouth daily.    Historical Provider, MD  benzonatate (TESSALON) 100 MG capsule Take 1 capsule (100 mg total) by mouth every 8 (eight) hours. 06/25/15   Pattricia Boss, MD  cetirizine (ZYRTEC) 10 MG tablet Take 10 mg by mouth See admin instructions. Take 1 tablet (10 mg) twice daily during allergy season    Historical Provider, MD  Chlorpheniramine Maleate (CHLOR-TRIMETON PO) Take 1 tablet by mouth See admin instructions. Take 1 tablet four times daily during allergy season    Historical Provider, MD  clonazePAM (KLONOPIN) 0.5 MG tablet Take 0.5 mg by mouth 3 (three) times daily.  07/01/14   Historical Provider, MD  cyclobenzaprine (FLEXERIL) 10 MG tablet Take 10 mg by mouth See admin instructions. Take 1 tablet (10 mg) every night at bedtime, may take an additional tablet during the day as needed for muscle spasms 07/15/14   Historical Provider, MD  dextromethorphan-guaiFENesin (MUCINEX DM) 30-600 MG per 12 hr tablet Take 1 tablet by mouth 2 (two) times daily as needed for cough (congestion).    Historical Provider, MD  diclofenac sodium (VOLTAREN) 1 % GEL Apply 1 application topically 2 (two) times daily as needed (arthritis pain - hands).    Historical Provider, MD  erythromycin ophthalmic ointment Place 1 application into both eyes 2 (two) times daily. Started 07/18/14 for pink eye 07/18/14   Historical Provider, MD  fenofibrate 160 MG tablet Take 160 mg by mouth daily with supper.     Historical Provider, MD  ferrous sulfate 325 (65 FE) MG tablet Take 325 mg by mouth daily with breakfast.    Historical Provider, MD  FLUoxetine (PROZAC) 40 MG capsule Take 40 mg by mouth at  bedtime.  07/14/14   Historical Provider, MD  Fluticasone Propionate (FLONASE ALLERGY RELIEF NA) Place 2 sprays into both nostrils See admin instructions. Use 2 sprays each nare daily at bedtime during allergy season    Historical Provider, MD  gabapentin (NEURONTIN) 100 MG capsule Take 100 mg by mouth 3 (three) times daily.    Historical Provider, MD  hydrochlorothiazide (MICROZIDE) 12.5 MG capsule Take 12.5 mg by mouth daily.  07/01/14   Historical Provider, MD  insulin aspart (NOVOLOG) 100 UNIT/ML injection Inject 5-25 Units into the skin 3 (three) times daily as needed for high blood sugar (CBG >120). Per sliding scale    Historical Provider, MD  insulin detemir (LEVEMIR) 100 UNIT/ML injection Inject 0.1 mLs (10 Units total) into the skin 2 (two) times daily. 07/21/14   Velvet Bathe, MD  levofloxacin (LEVAQUIN) 500 MG tablet Take 1 tablet (500 mg total) by mouth daily. 06/25/15   Pattricia Boss, MD  levothyroxine (SYNTHROID, LEVOTHROID) 75 MCG tablet Take 75 mcg by mouth See admin instructions. Take 1 tablet (75 mcg) every morning before breakfast except Mondays (Do not take on Mondays)    Historical Provider, MD  metoprolol (LOPRESSOR) 50 MG tablet Take 50 mg by mouth 3 (three) times daily.  06/28/14   Historical Provider, MD  mometasone (ASMANEX) 220 MCG/INH inhaler Inhale 1 puff into the lungs 2 (two) times daily.    Historical Provider, MD  montelukast (SINGULAIR) 10 MG tablet Take 10 mg by mouth daily.     Historical Provider, MD  Multiple Vitamins-Minerals (HAIR/SKIN/NAILS/BIOTIN) TABS Take 1 tablet by mouth daily.    Historical Provider, MD  omeprazole (PRILOSEC) 20 MG capsule Take 20 mg by mouth daily with supper.     Historical Provider, MD  ondansetron (ZOFRAN-ODT) 4 MG disintegrating tablet Take 4 mg by mouth daily as needed (nausea from pain medication).  06/03/14   Historical Provider, MD  oxyCODONE-acetaminophen (ROXICET) 5-325 MG per tablet Take 1 tablet by mouth every 4 (four) hours as needed  for severe pain. Patient taking differently: Take 1 tablet by mouth daily as needed for severe pain.  12/10/13   Raynelle Bring, MD  Phentermine-Topiramate 11.25-69 MG CP24 Take 1 tablet by mouth daily.    Historical Provider, MD  potassium citrate (UROCIT-K) 10 MEQ (1080 MG) SR tablet Take 10 mEq by mouth 4 (four) times daily.  06/17/14   Historical Provider, MD  predniSONE (DELTASONE) 10 MG tablet Take 2 tablets (20 mg total) by mouth daily. 06/25/15   Pattricia Boss, MD  Probiotic Product (PROBIOTIC DAILY PO) Take 1 capsule by mouth daily.     Historical Provider, MD  rOPINIRole (REQUIP) 2 MG tablet Take 2 mg by mouth 4 (four) times daily.     Historical Provider, MD  zolpidem (AMBIEN) 5 MG tablet Take 5 mg by mouth at bedtime as needed for sleep.  06/12/14   Historical Provider, MD   BP 108/43 mmHg  Pulse 66  Temp(Src) 98.7 F (37.1 C)  Resp 17  SpO2 93% Physical Exam  Constitutional: She is oriented to person, place, and time. She appears well-developed and well-nourished. No distress.  HENT:  Head: Normocephalic and atraumatic.  Mouth/Throat: Oropharynx is clear and moist.  Eyes: Conjunctivae and EOM are normal. Pupils are equal, round, and reactive to light.  Neck: Normal range of motion. Neck supple.  Cardiovascular: Normal rate, regular rhythm and intact distal pulses.   No murmur heard. Pulmonary/Chest: Effort normal. No respiratory distress. She has decreased breath sounds. She has no wheezes. She has rhonchi in the right lower field. She has no rales.  Abdominal: Soft. She exhibits no distension. There is no tenderness. There is no rebound and no guarding.  Musculoskeletal: Normal range of motion. She exhibits no edema or tenderness.  Neurological: She is alert and oriented to person, place, and time.  Skin: Skin is warm and dry. No rash noted. No erythema.  Psychiatric: She has a normal mood and affect. Her behavior is  normal.  Nursing note and vitals reviewed.   ED Course   Procedures (including critical care time) Labs Review Labs Reviewed  BASIC METABOLIC PANEL - Abnormal; Notable for the following:    Chloride 99 (*)    Glucose, Bld 208 (*)    BUN 44 (*)    Creatinine, Ser 1.11 (*)    GFR calc non Af Amer 52 (*)    GFR calc Af Amer 60 (*)    All other components within normal limits  CBC - Abnormal; Notable for the following:    RBC 3.60 (*)    Hemoglobin 10.2 (*)    HCT 31.7 (*)    All other components within normal limits  I-STAT TROPOININ, ED  I-STAT CG4 LACTIC ACID, ED    Imaging Review Dg Chest 2 View  07/02/2015  CLINICAL DATA:  Shortness of breath wheezing and chest pain. Recent pneumonia EXAM: CHEST  2 VIEW COMPARISON:  06/25/2015 FINDINGS: Loculated pleural fluid/ thickening on the right which is increased from prior. There is persistent lower lobe opacification which may reflect residual of the reported pneumonia. No cardiomegaly for technique. Negative aortic and hilar contours. IMPRESSION: Loculated right pleural effusion which has increased from 06/25/2015. Given recent history of pneumonia, consider CT to evaluate for complicated parapneumonic effusion. Electronically Signed   By: Monte Fantasia M.D.   On: 07/02/2015 12:24   Ct Chest W Contrast  07/02/2015  CLINICAL DATA:  Loculated pleural effusion EXAM: CT CHEST WITH CONTRAST TECHNIQUE: Multidetector CT imaging of the chest was performed during intravenous contrast administration. CONTRAST:  26mL ISOVUE-300 IOPAMIDOL (ISOVUE-300) INJECTION 61% COMPARISON:  None. FINDINGS: THORACIC INLET/BODY WALL: Goiter with calcified nodules projecting into the upper mediastinum. No chest wall inflammatory changes. MEDIASTINUM: Normal heart size. No pericardial effusion. No acute vascular abnormality. No adenopathy. LUNG WINDOWS: Dense consolidation in the right lower lobe with areas of central cavitation resulting in scattered gas bubbles. Small loculated pleural effusion around most of the right lower  lobe with marked pleural thickening. No pneumothorax. No contralateral pneumonia. No pulmonary edema. UPPER ABDOMEN: Prominent splenic size, accentuated by positioning and stable from 2015 abdominal CT where seen. OSSEOUS: No acute fracture.  No suspicious lytic or blastic lesions. IMPRESSION: Cavitating right lower lobe pneumonia with small but complex pleural effusion that is likely empyema. Electronically Signed   By: Monte Fantasia M.D.   On: 07/02/2015 15:38   I have personally reviewed and evaluated these images and lab results as part of my medical decision-making.   EKG Interpretation   Date/Time:  Wednesday July 02 2015 10:57:56 EDT Ventricular Rate:  69 PR Interval:  140 QRS Duration: 80 QT Interval:  396 QTC Calculation: 424 R Axis:   75 Text Interpretation:  Normal sinus rhythm T wave abnormality, consider  anterior ischemia No significant change since last tracing Confirmed by  Maryan Rued  MD, Loree Fee ([id number]) on 07/02/2015 11:29:44 AM      MDM   Final diagnoses:  Empyema ([place])  Cavitating mass of lung   Patient with multiple medical problems presenting today with shortness of breath and fatigue starting yesterday. Patient recently was treated for community-acquired pneumonia as an outpatient with Levaquin and steroids. She took her last dose of Levaquin today and finished her steroids yesterday. She states yesterday she got out of the house and went to [employer] which she had not been in to do last week. She does admit that she may have overdone it in the last 2 days and this morning  when she will go up she felt worse than yesterday. She describes as a tightness and shortness of breath. She feels generally tired. She denies any fevers or hemoptysis at this time. Prior she was coughing up blood but that is resolved and now she has yellow sputum. No lower extremity edema or fever. She did use her albuterol this morning with only minimal improvement. Initial blood pressure reading  here was 80/40 however multiple subsequent checks without intervention have been in the low 100s. Patient's lung sounds are clear but slightly diminished on expiration. Could be that patient's is having recurrent asthma exacerbation given she just finished steroids. We'll give albuterol, Atrovent and prednisone.  12:32 PM  Lactic acid, troponin, CBC all without acute findings. BMP shows no acute changes. Chest x-ray is significant for a new loculated pleural effusion that has increased in size from 6 days ago. They're recommending a CT for further evaluation.   4:03 PM CT showed a cavitating right lower lobe pneumonia with complex fluid collection concerning for an empyema. Patient started on Vanco and Zosyn. Spoke with Dr. Ashby Dawes with pulmonology and they will admit patient for further care.  Blanchie Dessert, MD 07/02/15 [phone]

## 2015-07-02 NOTE — ED Notes (Signed)
IV attempted without success. 

## 2015-07-03 LAB — GLUCOSE, CAPILLARY
Glucose-Capillary: 254 mg/dL — ABNORMAL HIGH (ref 65–99)
Glucose-Capillary: 148 mg/dL — ABNORMAL HIGH (ref 65–99)
Glucose-Capillary: 203 mg/dL — ABNORMAL HIGH (ref 65–99)
Glucose-Capillary: 172 mg/dL — ABNORMAL HIGH (ref 65–99)

## 2015-07-03 LAB — MRSA PCR SCREENING: MRSA by PCR: POSITIVE — AB

## 2015-07-03 MED ORDER — IBUPROFEN 600 MG PO TABS: 600.0000 mg | ORAL_TABLET | Freq: Four times a day (QID) | ORAL | Status: DC | PRN

## 2015-07-03 MED ORDER — ACETAMINOPHEN 500 MG PO TABS
1000.0000 mg | ORAL_TABLET | Freq: Four times a day (QID) | ORAL | Status: DC | PRN
  Administered 2015-07-03 – 2015-07-11 (×9): 1000 mg via ORAL
  Filled 2015-07-03 (×10): qty 2

## 2015-07-03 MED ORDER — RISAQUAD PO CAPS
1.0000 | ORAL_CAPSULE | Freq: Every day | ORAL | Status: DC
  Administered 2015-07-03 – 2015-07-11 (×9): 1 via ORAL
  Filled 2015-07-03 (×10): qty 1

## 2015-07-03 MED ORDER — CHLORHEXIDINE GLUCONATE CLOTH 2 % EX PADS
6.0000 | MEDICATED_PAD | Freq: Every day | CUTANEOUS | Status: AC
  Administered 2015-07-03 – 2015-07-07 (×5): 6 via TOPICAL

## 2015-07-03 MED ORDER — MUPIROCIN 2 % EX OINT
1.0000 "application " | TOPICAL_OINTMENT | Freq: Two times a day (BID) | CUTANEOUS | Status: AC
  Administered 2015-07-03 – 2015-07-07 (×10): 1 via NASAL
  Filled 2015-07-03: qty 22

## 2015-07-03 NOTE — Progress Notes (Signed)
New Admission Note:  Arrival Method: via stretcher with ED RN Mental Orientation: Alert&orientedx4 Telemetry: placed, CCMD notified Assessment: Completed Skin: see doc flowsheet IV: Left hand Pain: denies pain Tubes: Safety Measures: Safety Fall Prevention Plan was given, discussed and signed. Admission: Completed [facility] [place] Orientation: Patient has been orientated to the room, unit and the staff. Family: none at bedside  Orders have been reviewed and implemented. Will continue to monitor the patient. Call light has been placed within reach and bed alarm has been activated.   Leandro Reasoner BSN, RN  Phone Number: [phone] [facility]

## 2015-07-03 NOTE — Consult Note (Signed)
prog PULMONARY / CRITICAL CARE MEDICINE   Name: [name] MRN: [id number] DOB: [birth year]    ADMISSION DATE:  07/02/2015  REFERRING MD:  EDP  CHIEF COMPLAINT:  Empyema   HISTORY OF PRESENT ILLNESS:   65yo female with hx asthma, DM, HTN, OSA, recently treated as outpt for PNA with levaquin (last dose day of admission) presented to ER 4/12 with 2 days worsening fatigue, malaise, SOB.  CT chest revealed dense RLL PNA with questionable empyema.  Lactate negative, afebrile, VSS.  PCCM called to admit.   Per pt she initially felt significant improvement once started on levaquin.  Over last 2 days has had increased fatigue, cough, malaise, dyspnea.  Denies fevers, chills, hemoptysis, BLE edema.    SUBJECTIVE: Feels about the same today, having some R back pain around area of PNA/effusion. Worse with deep breathing.   VITAL SIGNS: BP 97/27 mmHg  Pulse 67  Temp(Src) 97.9 F (36.6 C) (Oral)  Resp 17  Ht 5\' 1"  (1.549 m)  Wt 95.528 kg (210 lb 9.6 oz)  BMI 39.81 kg/m2  SpO2 97%  HEMODYNAMICS:    VENTILATOR SETTINGS:    INTAKE / OUTPUT: I/O last 3 completed shifts: In: 1 [P.O.:480; IV Piggyback:200] Out: 800 [Urine:800]  PHYSICAL EXAMINATION: General:  Pleasant, obese female, NAD  Neuro:  Awake, alert, appropriate, MAE  HEENT:  Mm moist, no JVD  Cardiovascular:  S1s2, rrr Lungs:  resps even non labored, crackles R base Abdomen:  Round, soft, non tender,  Musculoskeletal:  Warm and dry, no edema   LABS:  BMET  Recent Labs Lab 07/02/15 1105  NA 137  K 5.0  CL 99*  CO2 27  BUN 44*  CREATININE 1.11*  GLUCOSE 208*    Electrolytes  Recent Labs Lab 07/02/15 1105  CALCIUM 9.7    CBC  Recent Labs Lab 07/02/15 1105  WBC 9.5  HGB 10.2*  HCT 31.7*  PLT 292    Coag's No results for input(s): APTT, INR in the last 168 hours.  Sepsis Markers  Recent Labs Lab 07/02/15 1114  LATICACIDVEN 0.73    ABG No results for input(s): PHART, PCO2ART,  PO2ART in the last 168 hours.  Liver Enzymes No results for input(s): AST, ALT, ALKPHOS, BILITOT, ALBUMIN in the last 168 hours.  Cardiac Enzymes No results for input(s): TROPONINI, PROBNP in the last 168 hours.  Glucose  Recent Labs Lab 07/02/15 1935 07/03/15 0901 07/03/15 1154  GLUCAP 286* 254* 148*    Imaging Ct Chest W Contrast  07/02/2015  CLINICAL DATA:  Loculated pleural effusion EXAM: CT CHEST WITH CONTRAST TECHNIQUE: Multidetector CT imaging of the chest was performed during intravenous contrast administration. CONTRAST:  28mL ISOVUE-300 IOPAMIDOL (ISOVUE-300) INJECTION 61% COMPARISON:  None. FINDINGS: THORACIC INLET/BODY WALL: Goiter with calcified nodules projecting into the upper mediastinum. No chest wall inflammatory changes. MEDIASTINUM: Normal heart size. No pericardial effusion. No acute vascular abnormality. No adenopathy. LUNG WINDOWS: Dense consolidation in the right lower lobe with areas of central cavitation resulting in scattered gas bubbles. Small loculated pleural effusion around most of the right lower lobe with marked pleural thickening. No pneumothorax. No contralateral pneumonia. No pulmonary edema. UPPER ABDOMEN: Prominent splenic size, accentuated by positioning and stable from 2015 abdominal CT where seen. OSSEOUS: No acute fracture.  No suspicious lytic or blastic lesions. IMPRESSION: Cavitating right lower lobe pneumonia with small but complex pleural effusion that is likely empyema. Electronically Signed   By: Monte Fantasia M.D.   On: 07/02/2015 15:38  STUDIES:  CT chest 4/12>>>dense RLL consolidation with central cavitation and associated complex pleural effusion which is likely empyema.   CULTURES: BC x 2 4/12>>> Sputum 4/12>>>  ANTIBIOTICS: Vanco 4/12>>> ceftaz 4/12>>> clinda 4/12>>>  SIGNIFICANT EVENTS:   LINES/TUBES:   DISCUSSION: 65yo female failed outpt rx for CAP now with dense RLL PNA and with ?empyema.   ASSESSMENT /  PLAN:  CAP -- failed outpt rx  ?empyema -- unclear.  Small fluid pocket.  Looks great clinically.    PLAN -  Blood cultures pending IV abx -- vanc, ceftaz, clindamycin  PRN BD  Supplemental O2 as needed to keep SpO2 > 92% Repeat CXR in AM Pulmonary hygiene  Would have CVTS eval although doubt acute indication for VATS, favor IV abx and monitor clinically  Avoid steroids  Ibuprofen for pleuritic pain Will start probiotic as she is on clindamycin   Updates: Pt updated by [facility] 4/13  Georgann Housekeeper, AGACNP-BC [facility] Pulmonology/Critical Care Pager [phone] or [phone]  07/03/2015 12:34 PM

## 2015-07-04 ENCOUNTER — Inpatient Hospital Stay (HOSPITAL_COMMUNITY): Payer: 59

## 2015-07-04 DIAGNOSIS — Z794 Long term (current) use of insulin: Secondary | ICD-10-CM

## 2015-07-04 DIAGNOSIS — IMO0001 Reserved for inherently not codable concepts without codable children: Secondary | ICD-10-CM | POA: Insufficient documentation

## 2015-07-04 DIAGNOSIS — G4733 Obstructive sleep apnea (adult) (pediatric): Secondary | ICD-10-CM | POA: Insufficient documentation

## 2015-07-04 DIAGNOSIS — Z9989 Dependence on other enabling machines and devices: Secondary | ICD-10-CM

## 2015-07-04 DIAGNOSIS — J189 Pneumonia, unspecified organism: Secondary | ICD-10-CM

## 2015-07-04 DIAGNOSIS — E119 Type 2 diabetes mellitus without complications: Secondary | ICD-10-CM | POA: Insufficient documentation

## 2015-07-04 LAB — GLUCOSE, CAPILLARY
Glucose-Capillary: 60 mg/dL — ABNORMAL LOW (ref 65–99)
Glucose-Capillary: 88 mg/dL (ref 65–99)
Glucose-Capillary: 156 mg/dL — ABNORMAL HIGH (ref 65–99)
Glucose-Capillary: 212 mg/dL — ABNORMAL HIGH (ref 65–99)
Glucose-Capillary: 158 mg/dL — ABNORMAL HIGH (ref 65–99)

## 2015-07-04 MED ORDER — CLINDAMYCIN PHOSPHATE 600 MG/50ML IV SOLN
600.0000 mg | Freq: Three times a day (TID) | INTRAVENOUS | Status: DC
  Administered 2015-07-04 – 2015-07-07 (×10): 600 mg via INTRAVENOUS
  Filled 2015-07-04 (×10): qty 50

## 2015-07-04 MED ORDER — PROBIOTIC DAILY PO CAPS: ORAL_CAPSULE | Freq: Every day | ORAL | Status: DC

## 2015-07-04 MED ORDER — LORATADINE 10 MG PO TABS
10.0000 mg | ORAL_TABLET | Freq: Every day | ORAL | Status: DC
  Administered 2015-07-05: 10 mg via ORAL
  Filled 2015-07-04 (×5): qty 1

## 2015-07-04 MED ORDER — FENOFIBRATE 160 MG PO TABS
160.0000 mg | ORAL_TABLET | Freq: Every day | ORAL | Status: DC
  Administered 2015-07-05 – 2015-07-10 (×6): 160 mg via ORAL
  Filled 2015-07-04 (×9): qty 1

## 2015-07-04 MED ORDER — MONTELUKAST SODIUM 10 MG PO TABS: 10.0000 mg | ORAL_TABLET | Freq: Every day | ORAL | Status: DC

## 2015-07-04 MED ORDER — MONTELUKAST SODIUM 10 MG PO TABS
10.0000 mg | ORAL_TABLET | Freq: Every day | ORAL | Status: DC
  Administered 2015-07-05 – 2015-07-11 (×7): 10 mg via ORAL
  Filled 2015-07-04 (×7): qty 1

## 2015-07-04 MED ORDER — LEVOTHYROXINE SODIUM 75 MCG PO TABS: 37.5000 ug | ORAL_TABLET | ORAL | Status: DC

## 2015-07-04 MED ORDER — FERROUS SULFATE 325 (65 FE) MG PO TABS
325.0000 mg | ORAL_TABLET | ORAL | Status: DC
  Administered 2015-07-06 – 2015-07-10 (×3): 325 mg via ORAL
  Filled 2015-07-04 (×3): qty 1

## 2015-07-04 MED ORDER — LORATADINE 10 MG PO TABS: 10.0000 mg | ORAL_TABLET | Freq: Every day | ORAL | Status: DC

## 2015-07-04 MED ORDER — LEVOTHYROXINE SODIUM 75 MCG PO TABS
37.5000 ug | ORAL_TABLET | Freq: Every day | ORAL | Status: DC
  Administered 2015-07-05 – 2015-07-11 (×7): 37.5 ug via ORAL
  Filled 2015-07-04 (×7): qty 1

## 2015-07-04 NOTE — Progress Notes (Signed)
Pharmacy Antibiotic Note  [name] is a 65 y.o. female admitted on 07/02/2015 with pneumonia with possible empyema.  Presented with increased SOB and fatigue, recently finished levofloxacin for PNA.  Pharmacy has been consulted for Vancomycin, Ceftazidime, and Clindamycin dosing.   Afebrile, WBC down to normal, CXR - stable loculated R pl effusion  Plan: -Vancomycin 1250 mg IV q24h -Ceftazidime 2 g IV q8h -Clindamycin 600 mg IV q8h -BMET in am -Follow renal function, clinical course, and cultures, VT as clinically indicated  Height: 5\' 1"  (154.9 cm) Weight: 210 lb 9.6 oz (95.528 kg) IBW/kg (Calculated) : 47.8  Temp (24hrs), Avg:98.2 F (36.8 C), Min:98 F (36.7 C), Max:98.4 F (36.9 C)   Recent Labs Lab 07/02/15 1105 07/02/15 1114  WBC 9.5  --   CREATININE 1.11*  --   LATICACIDVEN  --  0.73    Estimated Creatinine Clearance: 54.8 mL/min (by C-G formula based on Cr of 1.11).    Allergies  Allergen Reactions  . Codeine Nausea Only  . Imitrex [Sumatriptan] Other (See Comments)    Facial spasms  . Morphine Other (See Comments)    Severe headaches   . Nsaids Other (See Comments)    PT UNABLE TO TOLERATE NSAID'S DRUGS DUE TO HX OF GASTRIC SLEEVE SURGERY  . Promethazine Hcl Other (See Comments)    Restless leg feeling all over body  . Statins Other (See Comments)    Leg/muscle pain  . Sulfonamide Derivatives Hives  . Vicodin [Hydrocodone-Acetaminophen] Nausea And Vomiting    Antimicrobials this admission: Levaquin PTA for CAP  Vanc 4/12>>  Ceftaz 4/12>>  Clinda 4/12>>  Zosyn x1 4/12  Dose adjustments this admission: none  Microbiology results: MRSA PCR positive   Thank you for allowing pharmacy to be a part of this patient's care.  [place], Pharm.D., BCPS Clinical Pharmacist Pager: [phone] 07/04/2015 11:53 AM

## 2015-07-04 NOTE — Progress Notes (Signed)
PROGRESS NOTE  [name] [id number] DOB: 1950/05/15 DOA: 07/02/2015 PCP: Jerlyn Ly, MD  HPI/Recap of past 24 hours:  Sitting on the edge of bed, NAD, reported feeling better, less cough, mild pleuritic chest pain, on room air  Assessment/Plan: Active Problems:   Empyema (HCC)  pna with empyema: Ct chest on admission: Cavitating right lower lobe pneumonia with small but complex pleural effusion that is likely empyema. On broad spectrum abx, TRH pick up from pccm on 4/14, pccm continue to follow.  Insulin dependent dm2, continue insulin  HTN/HLD/hypothyroidism , stable on home meds.  osa on cpap  Code Status: full  Family Communication: patient   Disposition Plan: remain in the hospital   Consultants:  pccm admit on 4/12  [place] pick up on 4/14, pccm consult from 4/14  Procedures:  none  Antibiotics:  Vanc/fortaz/clindamycin   Objective: BP 124/47 mmHg  Pulse 66  Temp(Src) 98.4 F (36.9 C) (Oral)  Resp 16  Ht 5\' 1"  (1.549 m)  Wt 95.528 kg (210 lb 9.6 oz)  BMI 39.81 kg/m2  SpO2 99%  Intake/Output Summary (Last 24 hours) at 07/04/15 1819 Last data filed at 07/04/15 1515  Gross per 24 hour  Intake   2060 ml  Output      1 ml  Net   2059 ml   Filed Weights   07/02/15 1945  Weight: 95.528 kg (210 lb 9.6 oz)    Exam:   General:  NAD  Cardiovascular: RRR  Respiratory: diminished right lower lobe  Abdomen: Soft/ND/NT, positive BS  Musculoskeletal: No Edema  Neuro: aaox3  Data Reviewed: Basic Metabolic Panel:  Recent Labs Lab 07/02/15 1105  NA 137  K 5.0  CL 99*  CO2 27  GLUCOSE 208*  BUN 44*  CREATININE 1.11*  CALCIUM 9.7   Liver Function Tests: No results for input(s): AST, ALT, ALKPHOS, BILITOT, PROT, ALBUMIN in the last 168 hours. No results for input(s): LIPASE, AMYLASE in the last 168 hours. No results for input(s): AMMONIA in the last 168 hours. CBC:  Recent Labs Lab 07/02/15 1105  WBC 9.5  HGB 10.2*    HCT 31.7*  MCV 88.1  PLT 292   Cardiac Enzymes:   No results for input(s): CKTOTAL, CKMB, CKMBINDEX, TROPONINI in the last 168 hours. BNP (last 3 results) No results for input(s): BNP in the last 8760 hours.  ProBNP (last 3 results) No results for input(s): PROBNP in the last 8760 hours.  CBG:  Recent Labs Lab 07/03/15 2148 07/04/15 0747 07/04/15 0841 07/04/15 1208 07/04/15 1724  GLUCAP 172* 60* 88 156* 212*    Recent Results (from the past 240 hour(s))  MRSA PCR Screening     Status: Abnormal   Collection Time: 07/02/15 10:55 PM  Result Value Ref Range Status   MRSA by PCR POSITIVE (A) NEGATIVE Final    Comment:        The GeneXpert MRSA Assay (FDA approved for NASAL specimens only), is one component of a comprehensive MRSA colonization surveillance program. It is not intended to diagnose MRSA infection nor to guide or monitor treatment for MRSA infections. RESULT CALLED TO, READ BACK BY AND VERIFIED WITH: Ladona Mow @0250  07/03/15 MKELLY      Studies: Dg Chest 2 View  07/04/2015  CLINICAL DATA:  Shortness of breath EXAM: CHEST  2 VIEW COMPARISON:  07/02/2015 FINDINGS: Continued loculated right pleural effusion noted, stable. Persistent right lower lobe airspace disease, unchanged. No confluent opacity on the left. Heart is  borderline in size. No acute bony abnormality. IMPRESSION: Stable loculated right pleural effusion and right lower lobe airspace disease. No change. Electronically Signed   By: Rolm Baptise M.D.   On: 07/04/2015 08:17    Scheduled Meds: . acidophilus  1 capsule Oral Daily  . cefTAZidime (FORTAZ)  IV  2 g Intravenous Q8H  . Chlorhexidine Gluconate Cloth  6 each Topical Q0600  . clindamycin (CLEOCIN) IV  600 mg Intravenous Q8H  . clonazePAM  0.5 mg Oral BID  . cyclobenzaprine  10 mg Oral QHS  . ferrous sulfate  325 mg Oral Q breakfast  . FLUoxetine  40 mg Oral QHS  . gabapentin  100 mg Oral TID  . insulin aspart  0-15 Units Subcutaneous  TID WC & HS  . insulin detemir  25 Units Subcutaneous BID  . metoprolol tartrate  50 mg Oral BID  . mupirocin ointment  1 application Nasal BID  . pantoprazole  40 mg Oral Daily  . rOPINIRole  2 mg Oral TID  . vancomycin  1,250 mg Intravenous Q24H    Continuous Infusions:    Time spent: 27mins  Burle Kwan MD, PhD  [facility] Pager [phone]. If 7PM-7AM, please contact night-coverage at [email or web address], password [facility] 07/04/2015, 6:19 PM  LOS: 2 days

## 2015-07-04 NOTE — Progress Notes (Signed)
Pt states she can place on CPAP without assistance. Water chamber refilled, and CPAP placed in pt's reach. RT advised pt to call if she needed any assistance. RT will continue to monitor.

## 2015-07-04 NOTE — Progress Notes (Signed)
Inpatient Diabetes Program Recommendations  AACE/ADA: New Consensus Statement on Inpatient Glycemic Control (2015)  Target Ranges:  Prepandial:   less than 140 mg/dL      Peak postprandial:   less than 180 mg/dL (1-2 hours)      Critically ill patients:  140 - 180 mg/dL   Results for NOAMI, AMOR (MRN [id number]) as of 07/04/2015 08:13  Ref. Range 07/03/2015 09:01 07/03/2015 11:54 07/03/2015 16:58 07/03/2015 21:48 07/04/2015 07:47  Glucose-Capillary Latest Ref Range: 65-99 mg/dL 254 (H) 148 (H) 203 (H) 172 (H) 60 (L)   Review of Glycemic Control  Diabetes history: DM 2 Outpatient Diabetes medications: Novolog 5-25 units TID, Levemir 35 units BID Current orders for Inpatient glycemic control: Levemir 25 units BID, Novolog Moderate TID  Inpatient Diabetes Program Recommendations: Insulin - Basal: Hypoglycemia 60 this am. Patient is on Levemir 25 BID. Please consider decreasing HS dose of Levemir to 18 units.  Thanks,  Tama Headings RN, MSN, [facility] Inpatient Diabetes Coordinator Team Pager [phone] (8a-5p)

## 2015-07-04 NOTE — Progress Notes (Signed)
prog PULMONARY / CRITICAL CARE MEDICINE   Name: [name] MRN: [id number] DOB: 1950/08/07    ADMISSION DATE:  07/02/2015  REFERRING MD:  EDP  CHIEF COMPLAINT:  Empyema   HISTORY OF PRESENT ILLNESS:   65yo female with hx asthma, DM, HTN, OSA, recently treated as outpt for PNA with levaquin (last dose day of admission) presented to ER 4/12 with 2 days worsening fatigue, malaise, SOB.  CT chest revealed dense RLL PNA with questionable empyema.  Lactate negative, afebrile, VSS.  PCCM called to admit.   Per pt she initially felt significant improvement once started on levaquin.  Over last 2 days has had increased fatigue, cough, malaise, dyspnea.  Denies fevers, chills, hemoptysis, BLE edema.    SUBJECTIVE: Feels better today, having DOE.Marland Kitchen   VITAL SIGNS: BP 124/47 mmHg  Pulse 66  Temp(Src) 98.4 F (36.9 C) (Oral)  Resp 16  Ht 5\' 1"  (1.549 m)  Wt 210 lb 9.6 oz (95.528 kg)  BMI 39.81 kg/m2  SpO2 99%  HEMODYNAMICS:    VENTILATOR SETTINGS:    INTAKE / OUTPUT: I/O last 3 completed shifts: In: 2280 [P.O.:1680; IV Piggyback:600] Out: 0   PHYSICAL EXAMINATION: General:  Pleasant, obese female, NAD , reports less sob but still with DOE Neuro:  Awake, alert, appropriate, MAE  HEENT:  Mm moist, no JVD  Cardiovascular:  S1s2, rrr Lungs:  resps even non labored, crackles R base Abdomen:  Round, soft, non tender,  Musculoskeletal:  Warm and dry, no edema   LABS:  BMET  Recent Labs Lab 07/02/15 1105  NA 137  K 5.0  CL 99*  CO2 27  BUN 44*  CREATININE 1.11*  GLUCOSE 208*    Electrolytes  Recent Labs Lab 07/02/15 1105  CALCIUM 9.7    CBC  Recent Labs Lab 07/02/15 1105  WBC 9.5  HGB 10.2*  HCT 31.7*  PLT 292    Coag's No results for input(s): APTT, INR in the last 168 hours.  Sepsis Markers  Recent Labs Lab 07/02/15 1114  LATICACIDVEN 0.73    ABG No results for input(s): PHART, PCO2ART, PO2ART in the last 168 hours.  Liver Enzymes No  results for input(s): AST, ALT, ALKPHOS, BILITOT, ALBUMIN in the last 168 hours.  Cardiac Enzymes No results for input(s): TROPONINI, PROBNP in the last 168 hours.  Glucose  Recent Labs Lab 07/02/15 1935 07/03/15 0901 07/03/15 1154 07/03/15 1658 07/03/15 2148 07/04/15 0747  GLUCAP 286* 254* 148* 203* 172* 60*    Imaging Dg Chest 2 View  07/04/2015  CLINICAL DATA:  Shortness of breath EXAM: CHEST  2 VIEW COMPARISON:  07/02/2015 FINDINGS: Continued loculated right pleural effusion noted, stable. Persistent right lower lobe airspace disease, unchanged. No confluent opacity on the left. Heart is borderline in size. No acute bony abnormality. IMPRESSION: Stable loculated right pleural effusion and right lower lobe airspace disease. No change. Electronically Signed   By: Rolm Baptise M.D.   On: 07/04/2015 08:17     STUDIES:  CT chest 4/12>>>dense RLL consolidation with central cavitation and associated complex pleural effusion which is likely empyema.   CULTURES: BC x 2 4/12>>> Sputum 4/12>>>  ANTIBIOTICS: Vanco 4/12>>> ceftaz 4/12>>> clinda 4/12>>>  SIGNIFICANT EVENTS:   LINES/TUBES:   DISCUSSION: 65yo female failed outpt rx for CAP now with dense RLL PNA and with ?empyema.  4/14 she feels better but [place] in [place] / PLAN:  CAP -- failed outpt rx  ?empyema -- unclear.  Small fluid  pocket.  Looks great clinically.    PLAN -  Blood cultures > ? drawn IV abx -- vanc, ceftaz, clindamycin  PRN BD  Supplemental O2 as needed to keep SpO2 > 92% Pulmonary hygiene  Would have CVTS eval although doubt acute indication for VATS, favor IV abx and monitor clinically and by CXR. Should have a repeat CT chest in several days to look for interval resolution. If pleural space remains complicated then would consult TCTS Avoid steroids  Ibuprofen for pleuritic pain PCCM will see again on Monday.   Updates: Pt updated by [facility] 4/13 and by [facility] 4/14  Richardson Landry Minor ACNP Maryanna Shape  PCCM Pager [phone] till 3 pm If no answer page [phone] 07/04/2015, 8:22 AM  Attending Note:  I have examined patient, reviewed labs, studies and notes. I have discussed the case with S Minor, and I agree with the data and plans as amended above.  Baltazar Apo, MD, PhD 07/04/2015, 1:25 PM [facility] [phone] or if no answer [phone]

## 2015-07-04 NOTE — Progress Notes (Signed)
Went to room to deliver sterile sputum container for sputum culture order, noted a sterile sputum container at bedside.

## 2015-07-05 LAB — COMPREHENSIVE METABOLIC PANEL
ALT: 19 U/L (ref 14–54)
AST: 16 U/L (ref 15–41)
Albumin: 2.2 g/dL — ABNORMAL LOW (ref 3.5–5.0)
Alkaline Phosphatase: 60 U/L (ref 38–126)
Anion gap: 9 (ref 5–15)
BUN: 16 mg/dL (ref 6–20)
CO2: 29 mmol/L (ref 22–32)
Calcium: 8.8 mg/dL — ABNORMAL LOW (ref 8.9–10.3)
Chloride: 102 mmol/L (ref 101–111)
Creatinine, Ser: 1 mg/dL (ref 0.44–1.00)
GFR calc Af Amer: >60 mL/min (ref >60)
GFR calc non Af Amer: 59 mL/min — ABNORMAL LOW (ref >60)
Glucose, Bld: 203 mg/dL — ABNORMAL HIGH (ref 65–99)
Potassium: 4.3 mmol/L (ref 3.5–5.1)
Sodium: 140 mmol/L (ref 135–145)
Total Bilirubin: 0.4 mg/dL (ref 0.3–1.2)
Total Protein: 6.4 g/dL — ABNORMAL LOW (ref 6.5–8.1)

## 2015-07-05 LAB — GLUCOSE, CAPILLARY
Glucose-Capillary: 152 mg/dL — ABNORMAL HIGH (ref 65–99)
Glucose-Capillary: 118 mg/dL — ABNORMAL HIGH (ref 65–99)
Glucose-Capillary: 299 mg/dL — ABNORMAL HIGH (ref 65–99)
Glucose-Capillary: 228 mg/dL — ABNORMAL HIGH (ref 65–99)

## 2015-07-05 LAB — PROTIME-INR
INR: 1.29 (ref 0.00–1.49)
Prothrombin Time: 16.2 seconds — ABNORMAL HIGH (ref 11.6–15.2)

## 2015-07-05 LAB — EXPECTORATED SPUTUM ASSESSMENT W REFEX TO RESP CULTURE

## 2015-07-05 LAB — TSH: TSH: 0.821 u[IU]/mL (ref 0.350–4.500)

## 2015-07-05 LAB — EXPECTORATED SPUTUM ASSESSMENT W GRAM STAIN, RFLX TO RESP C

## 2015-07-05 NOTE — Progress Notes (Signed)
PROGRESS NOTE  [name] [id number] DOB: [birth year] DOA: 07/02/2015 PCP: Jerlyn Ly, MD  HPI/Recap of past 24 hours:  Sitting in chair, NAD, intermittent mild pleuritic chest pain, on room air  Assessment/Plan: Active Problems:   Empyema (HCC)   Insulin dependent diabetes mellitus (HCC)   OSA on CPAP  pna with empyema:  Ct chest on admission: Cavitating right lower lobe pneumonia with small but complex pleural effusion that is likely empyema. On broad spectrum abx, TRH pick up from pccm on 4/14, pccm continue to follow.  Insulin dependent dm2, continue insulin, a1c pending  HTN/HLD/hypothyroidism , stable on home meds.  osa on cpap  Code Status: full  Family Communication: patient   Disposition Plan: remain in the hospital   Consultants:  pccm admit on 4/12  [place] pick up on 4/14, pccm consult from 4/14  Procedures:  none  Antibiotics:  Vanc/fortaz/clindamycin   Objective: BP 114/55 mmHg  Pulse 77  Temp(Src) 98.2 F (36.8 C) (Oral)  Resp 16  Ht 5\' 1"  (1.549 m)  Wt 95.528 kg (210 lb 9.6 oz)  BMI 39.81 kg/m2  SpO2 98%  Intake/Output Summary (Last 24 hours) at 07/05/15 1820 Last data filed at 07/05/15 1320  Gross per 24 hour  Intake   1840 ml  Output     10 ml  Net   1830 ml   Filed Weights   07/02/15 1945  Weight: 95.528 kg (210 lb 9.6 oz)    Exam:   General:  NAD  Cardiovascular: RRR  Respiratory: diminished right lower lobe  Abdomen: Soft/ND/NT, positive BS  Musculoskeletal: No Edema  Neuro: aaox3  Data Reviewed: Basic Metabolic Panel:  Recent Labs Lab 07/02/15 1105 07/05/15 0411  NA 137 140  K 5.0 4.3  CL 99* 102  CO2 27 29  GLUCOSE 208* 203*  BUN 44* 16  CREATININE 1.11* 1.00  CALCIUM 9.7 8.8*   Liver Function Tests:  Recent Labs Lab 07/05/15 0411  AST 16  ALT 19  ALKPHOS 60  BILITOT 0.4  PROT 6.4*  ALBUMIN 2.2*   No results for input(s): LIPASE, AMYLASE in the last 168 hours. No results  for input(s): AMMONIA in the last 168 hours. CBC:  Recent Labs Lab 07/02/15 1105  WBC 9.5  HGB 10.2*  HCT 31.7*  MCV 88.1  PLT 292   Cardiac Enzymes:   No results for input(s): CKTOTAL, CKMB, CKMBINDEX, TROPONINI in the last 168 hours. BNP (last 3 results) No results for input(s): BNP in the last 8760 hours.  ProBNP (last 3 results) No results for input(s): PROBNP in the last 8760 hours.  CBG:  Recent Labs Lab 07/04/15 1724 07/04/15 2109 07/05/15 0734 07/05/15 1205 07/05/15 1640  GLUCAP 212* 158* 152* 118* 299*    Recent Results (from the past 240 hour(s))  MRSA PCR Screening     Status: Abnormal   Collection Time: 07/02/15 10:55 PM  Result Value Ref Range Status   MRSA by PCR POSITIVE (A) NEGATIVE Final    Comment:        The GeneXpert MRSA Assay (FDA approved for NASAL specimens only), is one component of a comprehensive MRSA colonization surveillance program. It is not intended to diagnose MRSA infection nor to guide or monitor treatment for MRSA infections. RESULT CALLED TO, READ BACK BY AND VERIFIED WITH: Ladona Mow @0250  07/03/15 MKELLY   Culture, expectorated sputum-assessment     Status: None   Collection Time: 07/05/15  9:27 AM  Result Value Ref  Range Status   Specimen Description SPUTUM  Final   Special Requests NONE  Final   Sputum evaluation THIS SPECIMEN IS ACCEPTABLE FOR SPUTUM CULTURE  Final   Report Status 07/05/2015 FINAL  Final     Studies: No results found.  Scheduled Meds: . acidophilus  1 capsule Oral Daily  . cefTAZidime (FORTAZ)  IV  2 g Intravenous Q8H  . Chlorhexidine Gluconate Cloth  6 each Topical Q0600  . clindamycin (CLEOCIN) IV  600 mg Intravenous Q8H  . clonazePAM  0.5 mg Oral BID  . cyclobenzaprine  10 mg Oral QHS  . fenofibrate  160 mg Oral Q supper  . [START ON 07/06/2015] ferrous sulfate  325 mg Oral QODAY  . FLUoxetine  40 mg Oral QHS  . gabapentin  100 mg Oral TID  . insulin aspart  0-15 Units Subcutaneous  TID WC & HS  . insulin detemir  25 Units Subcutaneous BID  . levothyroxine  37.5 mcg Oral QAC breakfast  . loratadine  10 mg Oral Daily  . metoprolol tartrate  50 mg Oral BID  . montelukast  10 mg Oral Daily  . mupirocin ointment  1 application Nasal BID  . pantoprazole  40 mg Oral Daily  . rOPINIRole  2 mg Oral TID  . vancomycin  1,250 mg Intravenous Q24H    Continuous Infusions:    Time spent: 28mins  Tayona Sarnowski MD, PhD  [facility] Pager [phone]. If 7PM-7AM, please contact night-coverage at [email or web address], password [facility] 07/05/2015, 6:20 PM  LOS: 3 days

## 2015-07-05 NOTE — Progress Notes (Signed)
CPAP set-up at bedside on 14 cm h2o with nasal mask. H2O full. Pt states she can place on herself per home use. I told her to call for further assistance

## 2015-07-06 ENCOUNTER — Inpatient Hospital Stay (HOSPITAL_COMMUNITY): Payer: 59

## 2015-07-06 ENCOUNTER — Encounter (HOSPITAL_COMMUNITY): Payer: Self-pay | Admitting: Radiology

## 2015-07-06 LAB — GLUCOSE, CAPILLARY
Glucose-Capillary: 151 mg/dL — ABNORMAL HIGH (ref 65–99)
Glucose-Capillary: 223 mg/dL — ABNORMAL HIGH (ref 65–99)
Glucose-Capillary: 321 mg/dL — ABNORMAL HIGH (ref 65–99)
Glucose-Capillary: 211 mg/dL — ABNORMAL HIGH (ref 65–99)

## 2015-07-06 LAB — VANCOMYCIN, TROUGH: Vancomycin Tr: 10 ug/mL (ref 10.0–20.0)

## 2015-07-06 MED ORDER — ONDANSETRON HCL 4 MG/2ML IJ SOLN
4.0000 mg | Freq: Three times a day (TID) | INTRAMUSCULAR | Status: DC | PRN
  Administered 2015-07-06 – 2015-07-10 (×2): 4 mg via INTRAVENOUS
  Filled 2015-07-06 (×2): qty 2

## 2015-07-06 MED ORDER — VANCOMYCIN HCL IN DEXTROSE 750-5 MG/150ML-% IV SOLN
750.0000 mg | Freq: Two times a day (BID) | INTRAVENOUS | Status: DC
  Administered 2015-07-07 – 2015-07-11 (×10): 750 mg via INTRAVENOUS
  Filled 2015-07-06 (×13): qty 150

## 2015-07-06 MED ORDER — ENOXAPARIN SODIUM 40 MG/0.4ML ~~LOC~~ SOLN
40.0000 mg | Freq: Once | SUBCUTANEOUS | Status: AC
  Administered 2015-07-06: 40 mg via SUBCUTANEOUS
  Filled 2015-07-06: qty 0.4

## 2015-07-06 NOTE — Progress Notes (Signed)
PROGRESS NOTE  [name] [id number] DOB: 05-22-1950 DOA: 07/02/2015 PCP: Jerlyn Ly, MD  HPI/Recap of past 24 hours:  Sitting in chair, reported not feeling well today, reported headache, and vomited this am , more pleuritic chest pain on right side, denies sob, on room air, husband in the room.  Assessment/Plan: Active Problems:   Empyema (HCC)   Insulin dependent diabetes mellitus (HCC)   OSA on CPAP  pna with empyema:  Ct chest on admission: Cavitating right lower lobe pneumonia with small but complex pleural effusion that is likely empyema. On broad spectrum abx, TRH pick up from pccm on 4/14, pccm continue to follow. 4/16 reported worsening of pleuritic chest pain, no sob, repeat ct chest, keep patient npo after midnight in case of procedure.   Insulin dependent dm2, continue insulin, a1c pending  HTN/HLD/hypothyroidism , stable on home meds.  osa on cpap  Code Status: full  Family Communication: patient and husband  Disposition Plan: remain in the hospital   Consultants:  pccm admit on 4/12  [place] pick up on 4/14, pccm consult from 4/14  Procedures:  none  Antibiotics:  Vanc/fortaz/clindamycin from admission   Objective: BP 116/45 mmHg  Pulse 71  Temp(Src) 99.5 F (37.5 C) (Oral)  Resp 20  Ht 5\' 1"  (1.549 m)  Wt 104.5 kg (230 lb 6.1 oz)  BMI 43.55 kg/m2  SpO2 100%  Intake/Output Summary (Last 24 hours) at 07/06/15 1325 Last data filed at 07/06/15 0853  Gross per 24 hour  Intake    440 ml  Output      0 ml  Net    440 ml   Filed Weights   07/02/15 1945 07/05/15 2016  Weight: 95.528 kg (210 lb 9.6 oz) 104.5 kg (230 lb 6.1 oz)    Exam:   General:  NAD  Cardiovascular: RRR  Respiratory: diminished right lower lobe  Abdomen: Soft/ND/NT, positive BS  Musculoskeletal: No Edema  Neuro: aaox3  Data Reviewed: Basic Metabolic Panel:  Recent Labs Lab 07/02/15 1105 07/05/15 0411  NA 137 140  K 5.0 4.3  CL 99* 102   CO2 27 29  GLUCOSE 208* 203*  BUN 44* 16  CREATININE 1.11* 1.00  CALCIUM 9.7 8.8*   Liver Function Tests:  Recent Labs Lab 07/05/15 0411  AST 16  ALT 19  ALKPHOS 60  BILITOT 0.4  PROT 6.4*  ALBUMIN 2.2*   No results for input(s): LIPASE, AMYLASE in the last 168 hours. No results for input(s): AMMONIA in the last 168 hours. CBC:  Recent Labs Lab 07/02/15 1105  WBC 9.5  HGB 10.2*  HCT 31.7*  MCV 88.1  PLT 292   Cardiac Enzymes:   No results for input(s): CKTOTAL, CKMB, CKMBINDEX, TROPONINI in the last 168 hours. BNP (last 3 results) No results for input(s): BNP in the last 8760 hours.  ProBNP (last 3 results) No results for input(s): PROBNP in the last 8760 hours.  CBG:  Recent Labs Lab 07/05/15 1205 07/05/15 1640 07/05/15 2015 07/06/15 0855 07/06/15 1122  GLUCAP 118* 299* 228* 151* 223*    Recent Results (from the past 240 hour(s))  MRSA PCR Screening     Status: Abnormal   Collection Time: 07/02/15 10:55 PM  Result Value Ref Range Status   MRSA by PCR POSITIVE (A) NEGATIVE Final    Comment:        The GeneXpert MRSA Assay (FDA approved for NASAL specimens only), is one component of a comprehensive MRSA colonization surveillance  program. It is not intended to diagnose MRSA infection nor to guide or monitor treatment for MRSA infections. RESULT CALLED TO, READ BACK BY AND VERIFIED WITH: Ladona Mow @0250  07/03/15 MKELLY   Culture, expectorated sputum-assessment     Status: None   Collection Time: 07/05/15  9:27 AM  Result Value Ref Range Status   Specimen Description SPUTUM  Final   Special Requests NONE  Final   Sputum evaluation THIS SPECIMEN IS ACCEPTABLE FOR SPUTUM CULTURE  Final   Report Status 07/05/2015 FINAL  Final  Culture, respiratory (NON-Expectorated)     Status: None (Preliminary result)   Collection Time: 07/05/15 10:01 AM  Result Value Ref Range Status   Specimen Description SPUTUM  Final   Special Requests NONE  Final    Gram Stain   Final    MODERATE WBC PRESENT, PREDOMINANTLY PMN FEW SQUAMOUS EPITHELIAL CELLS PRESENT FEW GRAM POSITIVE COCCI IN PAIRS IN CLUSTERS Performed at [facility]    Culture   Final    Culture reincubated for better growth Performed at [facility]    Report Status PENDING  Incomplete     Studies: No results found.  Scheduled Meds: . acidophilus  1 capsule Oral Daily  . cefTAZidime (FORTAZ)  IV  2 g Intravenous Q8H  . Chlorhexidine Gluconate Cloth  6 each Topical Q0600  . clindamycin (CLEOCIN) IV  600 mg Intravenous Q8H  . clonazePAM  0.5 mg Oral BID  . cyclobenzaprine  10 mg Oral QHS  . fenofibrate  160 mg Oral Q supper  . ferrous sulfate  325 mg Oral QODAY  . FLUoxetine  40 mg Oral QHS  . gabapentin  100 mg Oral TID  . insulin aspart  0-15 Units Subcutaneous TID WC & HS  . insulin detemir  25 Units Subcutaneous BID  . levothyroxine  37.5 mcg Oral QAC breakfast  . loratadine  10 mg Oral Daily  . metoprolol tartrate  50 mg Oral BID  . montelukast  10 mg Oral Daily  . mupirocin ointment  1 application Nasal BID  . pantoprazole  40 mg Oral Daily  . rOPINIRole  2 mg Oral TID  . vancomycin  1,250 mg Intravenous Q24H    Continuous Infusions:    Time spent: 57mins  Melyssa Signor MD, PhD  [facility] Pager [phone]. If 7PM-7AM, please contact night-coverage at [email or web address], password [facility] 07/06/2015, 1:25 PM  LOS: 4 days

## 2015-07-06 NOTE — Progress Notes (Signed)
Pt. made RT aware of her going to CT earlier this date on first rounds, is now getting washed up for bed, plan to assist with CPAP placement if needed.

## 2015-07-06 NOTE — Progress Notes (Signed)
Pharmacy Antibiotic Note  [name] is a 65 y.o. female admitted on 07/02/2015 with pneumonia with possible empyema.  Presented with increased SOB and fatigue, recently finished levofloxacin for PNA.  Vancomycin trough low at 10.  Afebrile, WBC down to normal, CXR - stable loculated R pleural effusion  Plan: -Adjust to vancomycin 750/12h -Ceftazidime 2 g IV q8h -Clindamycin 600 mg IV q8h -Monitor renal fx, cultures, obtain VT at Css    Height: 5\' 1"  (154.9 cm) Weight: 230 lb 6.1 oz (104.5 kg) IBW/kg (Calculated) : 47.8  Temp (24hrs), Avg:98.7 F (37.1 C), Min:98.4 F (36.9 C), Max:99.5 F (37.5 C)   Recent Labs Lab 07/02/15 1105 07/02/15 1114 07/05/15 0411 07/06/15 1830  WBC 9.5  --   --   --   CREATININE 1.11*  --  1.00  --   LATICACIDVEN  --  0.73  --   --   VANCOTROUGH  --   --   --  10    Estimated Creatinine Clearance: 64.1 mL/min (by C-G formula based on Cr of 1).    Allergies  Allergen Reactions  . Codeine Nausea Only  . Imitrex [Sumatriptan] Other (See Comments)    Facial spasms  . Morphine Other (See Comments)    Severe headaches   . Nsaids Other (See Comments)    PT UNABLE TO TOLERATE NSAID'S DRUGS DUE TO HX OF GASTRIC SLEEVE SURGERY  . Promethazine Hcl Other (See Comments)    Restless leg feeling all over body  . Statins Other (See Comments)    Leg/muscle pain  . Sulfonamide Derivatives Hives  . Vicodin [Hydrocodone-Acetaminophen] Nausea And Vomiting    Antimicrobials this admission: Levaquin PTA for CAP  Vanc 4/12>>  Ceftaz 4/12>>  Clinda 4/12>>  Zosyn x1 4/12  Dose adjustments this admission: 4/16 VT: 10   Microbiology results: MRSA PCR positive   Thank you for allowing pharmacy to be a part of this patient's care.    Hughes Better, PharmD, BCPS Clinical Pharmacist  07/06/2015 8:00 PM

## 2015-07-07 DIAGNOSIS — J869 Pyothorax without fistula: Secondary | ICD-10-CM

## 2015-07-07 DIAGNOSIS — J9601 Acute respiratory failure with hypoxia: Secondary | ICD-10-CM | POA: Insufficient documentation

## 2015-07-07 DIAGNOSIS — J9600 Acute respiratory failure, unspecified whether with hypoxia or hypercapnia: Secondary | ICD-10-CM | POA: Insufficient documentation

## 2015-07-07 LAB — CBC
HCT: 31.6 % — ABNORMAL LOW (ref 36.0–46.0)
Hemoglobin: 9.9 g/dL — ABNORMAL LOW (ref 12.0–15.0)
MCH: 27.2 pg (ref 26.0–34.0)
MCHC: 31.3 g/dL (ref 30.0–36.0)
MCV: 86.8 fL (ref 78.0–100.0)
Platelets: 253 10*3/uL (ref 150–400)
RBC: 3.64 MIL/uL — ABNORMAL LOW (ref 3.87–5.11)
RDW: 14.1 % (ref 11.5–15.5)
WBC: 4.4 10*3/uL (ref 4.0–10.5)

## 2015-07-07 LAB — BASIC METABOLIC PANEL
Anion gap: 12 (ref 5–15)
BUN: 16 mg/dL (ref 6–20)
CO2: 26 mmol/L (ref 22–32)
Calcium: 9 mg/dL (ref 8.9–10.3)
Chloride: 100 mmol/L — ABNORMAL LOW (ref 101–111)
Creatinine, Ser: 1.02 mg/dL — ABNORMAL HIGH (ref 0.44–1.00)
GFR calc Af Amer: >60 mL/min (ref >60)
GFR calc non Af Amer: 57 mL/min — ABNORMAL LOW (ref >60)
Glucose, Bld: 221 mg/dL — ABNORMAL HIGH (ref 65–99)
Potassium: 4.6 mmol/L (ref 3.5–5.1)
Sodium: 138 mmol/L (ref 135–145)

## 2015-07-07 LAB — GLUCOSE, CAPILLARY
Glucose-Capillary: 182 mg/dL — ABNORMAL HIGH (ref 65–99)
Glucose-Capillary: 100 mg/dL — ABNORMAL HIGH (ref 65–99)
Glucose-Capillary: 90 mg/dL (ref 65–99)
Glucose-Capillary: 242 mg/dL — ABNORMAL HIGH (ref 65–99)

## 2015-07-07 LAB — HEMOGLOBIN A1C
Hgb A1c MFr Bld: 7.9 % — ABNORMAL HIGH (ref 4.8–5.6)
Mean Plasma Glucose: 180 mg/dL

## 2015-07-07 MED ORDER — PIPERACILLIN-TAZOBACTAM 3.375 G IVPB
3.3750 g | Freq: Three times a day (TID) | INTRAVENOUS | Status: DC
  Administered 2015-07-07 – 2015-07-11 (×13): 3.375 g via INTRAVENOUS
  Filled 2015-07-07 (×15): qty 50

## 2015-07-07 MED ORDER — TRAMADOL HCL 50 MG PO TABS
50.0000 mg | ORAL_TABLET | Freq: Four times a day (QID) | ORAL | Status: DC | PRN
  Administered 2015-07-07 – 2015-07-11 (×4): 50 mg via ORAL
  Filled 2015-07-07 (×4): qty 1

## 2015-07-07 NOTE — Progress Notes (Signed)
Inpatient Diabetes Program Recommendations  AACE/ADA: New Consensus Statement on Inpatient Glycemic Control (2015)  Target Ranges:  Prepandial:   less than 140 mg/dL      Peak postprandial:   less than 180 mg/dL (1-2 hours)      Critically ill patients:  140 - 180 mg/dL   Results for ADAJA, MARUSAK (MRN [id number]) as of 07/07/2015 11:18  Ref. Range 07/06/2015 08:55 07/06/2015 11:22 07/06/2015 16:46 07/06/2015 21:50 07/07/2015 08:05  Glucose-Capillary Latest Ref Range: 65-99 mg/dL 151 (H) 223 (H) 321 (H) 211 (H) 182 (H)   Review of Glycemic Control  Diabetes history: DM 2 Outpatient Diabetes medications: Levemir 35 units BID, Novolog 5-25 units TID with meals Current orders for Inpatient glycemic control: Levemir 25 units BID, Novolog Moderate TID  Inpatient Diabetes Program Recommendations:  Insulin - Meal Coverage: Glucose increases into the 300's at meal times. Patient takes Novolog 5-25 units TID for meal coverage at home. When eating, please consider ordering Novolog 5 units TID for meal coverage given only if the patient consumes at least 50% of meals.  Thanks,  Tama Headings RN, MSN, [facility] Inpatient Diabetes Coordinator Team Pager [phone] (8a-5p)

## 2015-07-07 NOTE — Progress Notes (Signed)
Pt. puts herself on CPAP independently, on room air using small nasal mask as @ home, aware to notify if needing help.

## 2015-07-07 NOTE — Progress Notes (Signed)
Procedure(s) (LRB): VIDEO ASSISTED THORACOSCOPY (VATS)/DRAIN EMPYEMA (Right) Subjective: Patient examined, both chest CT scans performed during this hospitalization personally reviewed and counseled with patient. 65 year old morbidly obese  [BMI 40 ]female with chronic bronchitis with recent acute pulmonary illness-pneumonia right lower lobe. This was initially treated as an outpatient but she developed weakness, high fever and right pleuritic chest pain and was admitted to the hospital 12 days ago.. CT scan showed an area consolidation at at the right base with a parapneumonic effusion. She responded symptomatically to the IV antibiotics-vancomycin and Zosyn-with improved strength, resolution of her fever and normal white count. She still has some pleuritic right chest pain site follow-up CT scan was performed yesterday which showed persistent right lower lobe parenchymal density but the effusion has decreased in size.  The patient would be at very high risk for general anesthesia and a VATS procedure would probably have a long ventilator dependent recovery with risk for complications similar to her prolonged hospital stay after abdominal surgery at [facility] 2 years ago. Objective: Vital signs in last 24 hours: Temp:  [97.9 F (36.6 C)-99.2 F (37.3 C)] 98.6 F (37 C) (04/17 1705) Pulse Rate:  [68-74] 69 (04/17 1705) Cardiac Rhythm:  [-] Normal sinus rhythm (04/17 0700) Resp:  [18] 18 (04/17 1705) BP: (109-135)/(30-58) 135/58 mmHg (04/17 1705) SpO2:  [97 %-100 %] 100 % (04/17 1705)  Hemodynamic parameters for last 24 hours:   stable  Intake/Output from previous day: 04/16 0701 - 04/17 0700 In: 850 [P.O.:600; IV Piggyback:250] Out: 0  Intake/Output this shift: Total I/O In: 120 [P.O.:120] Out: 0   Morbidly obese but comfortable and very mobile Breath sounds are clear Heart rhythm regular No chest wall tenderness Neurologic intact Lab Results:  Recent Labs   07/07/15 0627  WBC 4.4  HGB 9.9*  HCT 31.6*  PLT 253   BMET:  Recent Labs  07/05/15 0411 07/07/15 0627  NA 140 138  K 4.3 4.6  CL 102 100*  CO2 29 26  GLUCOSE 203* 221*  BUN 16 16  CREATININE 1.00 1.02*  CALCIUM 8.8* 9.0    PT/INR:  Recent Labs  07/05/15 0411  LABPROT 16.2*  INR 1.29   ABG    Component Value Date/Time   TCO2 23 07/20/2014 1639   CBG (last 3)   Recent Labs  07/07/15 0805 07/07/15 1208 07/07/15 1701  GLUCAP 182* 100* 90    Assessment/Plan:  Would not recommend VATS but would recommend continue IV antibiotics with transition to oral antibiotics later in the week and then follow up as an outpatient. I will follow-up the patient in the office with a CT scan about a week after discharge. This plan was discussed with the patient and husband and she understands and agrees.   LOS: 5 days    Tharon Aquas Trigt III 07/07/2015

## 2015-07-07 NOTE — Progress Notes (Addendum)
PROGRESS NOTE  [name] [id number] DOB: 08-Jun-1950 DOA: 07/02/2015 PCP: Jerlyn Ly, MD    Assessment/Plan: Active Problems:   Empyema ([place])   Insulin dependent diabetes mellitus (HCC)   OSA on CPAP   Complicated community-acquired pneumonia with empyema  Ct chest on admission: Cavitating right lower lobe pneumonia with small but complex pleural effusion that is likely empyema. Repeat CT scan 4/17 shows persistent right-sided empyema On broad spectrum abx, TRH pick up from pccm on 4/14, pccm continue to follow. 4/16 reported worsening of pleuritic chest pain, no sob, keep patient npo after midnight  pending cardiothoracic surgery consultation. Requested Dr. Lucianne Lei tright to evaluate Discussed with infectious disease Dr. Raul Del recommends treating the patient with vancomycin and Zosyn, await further culture results from her empyema, patient would possibly need 3 more weeks of antibiotics after drainage. It would be important to have results from her culture from the empyema. NPO pending cardiothoracic surgery consult    Insulin dependent dm2, continue insulin, a1c pending  HTN/HLD/hypothyroidism , stable on home meds.  osa on cpap  Code Status: full  Family Communication: patient and husband  Disposition Plan: Cardiothoracic surgery and infectious disease consultation   Consultants:  pccm admit on 4/12  [place] pick up on 4/14, pccm consult from 4/14  Thoracic surgery consultation  Infectious disease consultation  Procedures:  none  Antibiotics: Zosyn 4/12 Vancomycin 4 /12 - Ceftazidime 4/12- Clindamycin 4/12-   Subjective-patient reports pleuritic right-sided chest pain, worse when she takes in a deep breath   Objective: BP 109/38 mmHg  Pulse 68  Temp(Src) 98.9 F (37.2 C) (Oral)  Resp 18  Ht 5\' 1"  (1.549 m)  Wt 104.5 kg (230 lb 6.1 oz)  BMI 43.55 kg/m2  SpO2 97%  Intake/Output Summary (Last 24 hours) at 07/07/15 0855 Last data filed at  07/07/15 0802  Gross per 24 hour  Intake    730 ml  Output      0 ml  Net    730 ml   Filed Weights   07/02/15 1945 07/05/15 2016  Weight: 95.528 kg (210 lb 9.6 oz) 104.5 kg (230 lb 6.1 oz)    Exam:   General:  NAD  Cardiovascular: RRR  Respiratory: diminished right lower lobe  Abdomen: Soft/ND/NT, positive BS  Musculoskeletal: No Edema  Neuro: aaox3  Data Reviewed: Basic Metabolic Panel:  Recent Labs Lab 07/02/15 1105 07/05/15 0411 07/07/15 0627  NA 137 140 138  K 5.0 4.3 4.6  CL 99* 102 100*  CO2 27 29 26   GLUCOSE 208* 203* 221*  BUN 44* 16 16  CREATININE 1.11* 1.00 1.02*  CALCIUM 9.7 8.8* 9.0   Liver Function Tests:  Recent Labs Lab 07/05/15 0411  AST 16  ALT 19  ALKPHOS 60  BILITOT 0.4  PROT 6.4*  ALBUMIN 2.2*   No results for input(s): LIPASE, AMYLASE in the last 168 hours. No results for input(s): AMMONIA in the last 168 hours. CBC:  Recent Labs Lab 07/02/15 1105 07/07/15 0627  WBC 9.5 4.4  HGB 10.2* 9.9*  HCT 31.7* 31.6*  MCV 88.1 86.8  PLT 292 253   Cardiac Enzymes:   No results for input(s): CKTOTAL, CKMB, CKMBINDEX, TROPONINI in the last 168 hours. BNP (last 3 results) No results for input(s): BNP in the last 8760 hours.  ProBNP (last 3 results) No results for input(s): PROBNP in the last 8760 hours.  CBG:  Recent Labs Lab 07/06/15 0855 07/06/15 1122 07/06/15 1646 07/06/15 2150 07/07/15 0805  GLUCAP 151* 223* 321* 211* 182*    Recent Results (from the past 240 hour(s))  MRSA PCR Screening     Status: Abnormal   Collection Time: 07/02/15 10:55 PM  Result Value Ref Range Status   MRSA by PCR POSITIVE (A) NEGATIVE Final    Comment:        The GeneXpert MRSA Assay (FDA approved for NASAL specimens only), is one component of a comprehensive MRSA colonization surveillance program. It is not intended to diagnose MRSA infection nor to guide or monitor treatment for MRSA infections. RESULT CALLED TO, READ BACK BY  AND VERIFIED WITH: Ladona Mow @0250  07/03/15 MKELLY   Culture, expectorated sputum-assessment     Status: None   Collection Time: 07/05/15  9:27 AM  Result Value Ref Range Status   Specimen Description SPUTUM  Final   Special Requests NONE  Final   Sputum evaluation THIS SPECIMEN IS ACCEPTABLE FOR SPUTUM CULTURE  Final   Report Status 07/05/2015 FINAL  Final  Culture, respiratory (NON-Expectorated)     Status: None (Preliminary result)   Collection Time: 07/05/15 10:01 AM  Result Value Ref Range Status   Specimen Description SPUTUM  Final   Special Requests NONE  Final   Gram Stain   Final    MODERATE WBC PRESENT, PREDOMINANTLY PMN FEW SQUAMOUS EPITHELIAL CELLS PRESENT FEW GRAM POSITIVE COCCI IN PAIRS IN CLUSTERS Performed at [facility]    Culture   Final    Culture reincubated for better growth Performed at [facility]    Report Status PENDING  Incomplete     Studies: Ct Chest Wo Contrast  07/07/2015  CLINICAL DATA:  65 year old female with pneumonia. Evaluate for possible empyema. Subsequent encounter. EXAM: CT CHEST WITHOUT CONTRAST TECHNIQUE: Multidetector CT imaging of the chest was performed following the standard protocol without IV contrast. COMPARISON:  07/02/2015 and 08/16/2015 CT.  07/04/2015 chest x-ray. FINDINGS: Consolidation right lower lobe with air bronchograms with increase in degree of cavitations. Right sided loculated pleural effusion/pleural thickening suggestive of an empyema has decreased slightly in size. For instance, posteriorly, maximal thickness of empyema currently 2 cm versus prior 2.6 cm. Substernal extension of multi nodular goiter with coarse calcifications unchanged with displacement surrounding vascular structures. This can be followed by thyroid ultrasound. Top-normal size mediastinal lymph nodes most notable subcarinal region. Heart size top-normal. Mild calcification thoracic aorta air. Visualized upper abdominal structures  without acute abnormality. Degenerative changes thoracic spine without focal compression deformity. No obvious intradural extension of infectious process. No obvious breast mass or axillary adenopathy. IMPRESSION: Consolidation right lower lobe with air bronchograms with increase in degree of cavitations. Right sided empyema has decreased slightly in size. For instance, posteriorly, maximal thickness of empyema currently 2 cm versus prior 2.6 cm. Electronically Signed   By: Genia Del M.D.   On: 07/07/2015 07:32    Scheduled Meds: . acidophilus  1 capsule Oral Daily  . cefTAZidime (FORTAZ)  IV  2 g Intravenous Q8H  . clindamycin (CLEOCIN) IV  600 mg Intravenous Q8H  . clonazePAM  0.5 mg Oral BID  . cyclobenzaprine  10 mg Oral QHS  . fenofibrate  160 mg Oral Q supper  . ferrous sulfate  325 mg Oral QODAY  . FLUoxetine  40 mg Oral QHS  . gabapentin  100 mg Oral TID  . insulin aspart  0-15 Units Subcutaneous TID WC & HS  . insulin detemir  25 Units Subcutaneous BID  . levothyroxine  37.5 mcg Oral  QAC breakfast  . loratadine  10 mg Oral Daily  . metoprolol tartrate  50 mg Oral BID  . montelukast  10 mg Oral Daily  . mupirocin ointment  1 application Nasal BID  . pantoprazole  40 mg Oral Daily  . rOPINIRole  2 mg Oral TID  . vancomycin  750 mg Intravenous Q12H    Continuous Infusions:    Time spent: 36mins  Shalamar Plourde MD,   [facility] Pager [phone]. If 7PM-7AM, please contact night-coverage at [email or web address], password [facility] 07/07/2015, 8:55 AM  LOS: 5 days

## 2015-07-08 ENCOUNTER — Other Ambulatory Visit: Payer: Self-pay | Admitting: *Deleted

## 2015-07-08 ENCOUNTER — Encounter (HOSPITAL_COMMUNITY)
Admission: EM | Disposition: A | Discharge status: Home Health | Payer: Self-pay | Source: Home / Self Care | Attending: Internal Medicine

## 2015-07-08 DIAGNOSIS — J869 Pyothorax without fistula: Secondary | ICD-10-CM | POA: Insufficient documentation

## 2015-07-08 LAB — GLUCOSE, CAPILLARY
Glucose-Capillary: 163 mg/dL — ABNORMAL HIGH (ref 65–99)
Glucose-Capillary: 215 mg/dL — ABNORMAL HIGH (ref 65–99)
Glucose-Capillary: 257 mg/dL — ABNORMAL HIGH (ref 65–99)
Glucose-Capillary: 202 mg/dL — ABNORMAL HIGH (ref 65–99)

## 2015-07-08 LAB — COMPREHENSIVE METABOLIC PANEL
ALT: 16 U/L (ref 14–54)
AST: 15 U/L (ref 15–41)
Albumin: 2.4 g/dL — ABNORMAL LOW (ref 3.5–5.0)
Alkaline Phosphatase: 64 U/L (ref 38–126)
Anion gap: 11 (ref 5–15)
BUN: 16 mg/dL (ref 6–20)
CO2: 30 mmol/L (ref 22–32)
Calcium: 9 mg/dL (ref 8.9–10.3)
Chloride: 96 mmol/L — ABNORMAL LOW (ref 101–111)
Creatinine, Ser: 1.09 mg/dL — ABNORMAL HIGH (ref 0.44–1.00)
GFR calc Af Amer: >60 mL/min (ref >60)
GFR calc non Af Amer: 53 mL/min — ABNORMAL LOW (ref >60)
Glucose, Bld: 232 mg/dL — ABNORMAL HIGH (ref 65–99)
Potassium: 4.4 mmol/L (ref 3.5–5.1)
Sodium: 137 mmol/L (ref 135–145)
Total Bilirubin: 0.7 mg/dL (ref 0.3–1.2)
Total Protein: 7.1 g/dL (ref 6.5–8.1)

## 2015-07-08 LAB — CULTURE, RESPIRATORY W GRAM STAIN

## 2015-07-08 LAB — CBC
HCT: 31.4 % — ABNORMAL LOW (ref 36.0–46.0)
Hemoglobin: 9.7 g/dL — ABNORMAL LOW (ref 12.0–15.0)
MCH: 27.2 pg (ref 26.0–34.0)
MCHC: 30.9 g/dL (ref 30.0–36.0)
MCV: 88 fL (ref 78.0–100.0)
Platelets: 266 10*3/uL (ref 150–400)
RBC: 3.57 MIL/uL — ABNORMAL LOW (ref 3.87–5.11)
RDW: 14.1 % (ref 11.5–15.5)
WBC: 4 10*3/uL (ref 4.0–10.5)

## 2015-07-08 LAB — CULTURE, RESPIRATORY

## 2015-07-08 SURGERY — VIDEO ASSISTED THORACOSCOPY (VATS)/EMPYEMA
Anesthesia: General | Site: Chest | Laterality: Right

## 2015-07-08 MED ORDER — ENOXAPARIN SODIUM 40 MG/0.4ML ~~LOC~~ SOLN
40.0000 mg | SUBCUTANEOUS | Status: DC
  Administered 2015-07-08 – 2015-07-10 (×3): 40 mg via SUBCUTANEOUS
  Filled 2015-07-08 (×3): qty 0.4

## 2015-07-08 MED ORDER — INSULIN ASPART 100 UNIT/ML ~~LOC~~ SOLN
5.0000 [IU] | Freq: Three times a day (TID) | SUBCUTANEOUS | Status: DC
  Administered 2015-07-08 – 2015-07-11 (×10): 5 [IU] via SUBCUTANEOUS

## 2015-07-08 NOTE — Progress Notes (Signed)
PROGRESS NOTE  [name] [id number] DOB: [birth year] DOA: 07/02/2015 PCP: Jerlyn Ly, MD    Assessment/Plan: Active Problems:   Empyema ([place])   Insulin dependent diabetes mellitus (HCC)   OSA on CPAP   Acute respiratory failure ([place])   Brief summary 65yo female with hx asthma, DM, HTN, OSA, recently treated as outpt for PNA with levaquin (last dose day of admission) presented to ER 4/12 with 2 days worsening fatigue, malaise, SOB. CT chest revealed dense RLL PNA with questionable empyema. Lactate negative, afebrile, VSS. PCCM called to admit.   Per pt she initially felt significant improvement once started on levaquin. Over last 2 days has had increased fatigue, cough, malaise, dyspnea.CT scan showed an area consolidation at at the right base with a parapneumonic effusion. She responded symptomatically to the IV antibiotics-vancomycin and Zosyn-with improved strength, resolution of her fever and normal white count. She still has some pleuritic right chest pain site follow-up CT scan was performed yesterday which showed persistent right lower lobe parenchymal density but the effusion has decreased in size. Patient evaluated by cardiothoracic surgery, deemed to be an extremely high risk for VATS procedure .  Assessment and plan Complicated community-acquired pneumonia with empyema Ct chest on admission: Cavitating right lower lobe pneumonia with small but complex pleural effusion that is likely empyema. Repeat CT scan 4/17 shows persistent right-sided empyema On broad spectrum abx, TRH pick up from pccm on 4/14, pccm continue to follow. 4/16 reported worsening of pleuritic chest pain, no sob, keep patient npo after midnight  pending cardiothoracic surgery consultation. Requested Dr. Lucianne Lei tright to evaluate Discussed with infectious disease Dr. Raul Del recommends treating the patient with vancomycin and Zosyn, await further culture results from her empyema, patient would  possibly need 3 more weeks of antibiotics  . It would be important to have results from her culture from the empyema.  Cardiothoracic surgery  Has evaluated the patient does not recommend VATS  at this time. Recommend to continue IV antibiotics  For several more days and transitioned to oral antibiotics and repeat CT scan in 1 week after discharge.      Insulin dependent dm2, continue insulin, a1c 7.9 He was started on NovoLog 5 units 3 times a day before meals  HTN/HLD/hypothyroidism , stable on home meds.  osa on cpap  Code Status: full  Family Communication: patient and husband  Disposition Plan: Anticipate continuation of IV antibiotics to  the end of the week   Consultants:  pccm admit on 4/12  [place] pick up on 4/14, pccm consult from 4/14  Thoracic surgery consultation  Infectious disease consultation - Via phone  Procedures:  none  Antibiotics: Zosyn 4/12 Vancomycin 4 /12 - Ceftazidime 4/12-4/17 Clindamycin 4/12-4/17 Zosyn 4/17   Subjective-continues to have pleuritic right-sided chest pain    Objective: BP 125/73 mmHg  Pulse 71  Temp(Src) 97.8 F (36.6 C) (Oral)  Resp 19  Ht 5\' 1"  (1.549 m)  Wt 104.5 kg (230 lb 6.1 oz)  BMI 43.55 kg/m2  SpO2 98%  Intake/Output Summary (Last 24 hours) at 07/08/15 0837 Last data filed at 07/08/15 0821  Gross per 24 hour  Intake    970 ml  Output      0 ml  Net    970 ml   Filed Weights   07/02/15 1945 07/05/15 2016  Weight: 95.528 kg (210 lb 9.6 oz) 104.5 kg (230 lb 6.1 oz)    Exam:   General:  NAD  Cardiovascular: RRR  Respiratory: diminished right lower lobe  Abdomen: Soft/ND/NT, positive BS  Musculoskeletal: No Edema  Neuro: aaox3  Data Reviewed: Basic Metabolic Panel:  Recent Labs Lab 07/02/15 1105 07/05/15 0411 07/07/15 0627 07/08/15 0626  NA 137 140 138 137  K 5.0 4.3 4.6 4.4  CL 99* 102 100* 96*  CO2 27 29 26 30   GLUCOSE 208* 203* 221* 232*  BUN 44* 16 16 16   CREATININE 1.11*  1.00 1.02* 1.09*  CALCIUM 9.7 8.8* 9.0 9.0   Liver Function Tests:  Recent Labs Lab 07/05/15 0411 07/08/15 0626  AST 16 15  ALT 19 16  ALKPHOS 60 64  BILITOT 0.4 0.7  PROT 6.4* 7.1  ALBUMIN 2.2* 2.4*   No results for input(s): LIPASE, AMYLASE in the last 168 hours. No results for input(s): AMMONIA in the last 168 hours. CBC:  Recent Labs Lab 07/02/15 1105 07/07/15 0627 07/08/15 0626  WBC 9.5 4.4 4.0  HGB 10.2* 9.9* 9.7*  HCT 31.7* 31.6* 31.4*  MCV 88.1 86.8 88.0  PLT 292 253 266   Cardiac Enzymes:   No results for input(s): CKTOTAL, CKMB, CKMBINDEX, TROPONINI in the last 168 hours. BNP (last 3 results) No results for input(s): BNP in the last 8760 hours.  ProBNP (last 3 results) No results for input(s): PROBNP in the last 8760 hours.  CBG:  Recent Labs Lab 07/07/15 0805 07/07/15 1208 07/07/15 1701 07/07/15 2149 07/08/15 0821  GLUCAP 182* 100* 90 242* 163*    Recent Results (from the past 240 hour(s))  MRSA PCR Screening     Status: Abnormal   Collection Time: 07/02/15 10:55 PM  Result Value Ref Range Status   MRSA by PCR POSITIVE (A) NEGATIVE Final    Comment:        The GeneXpert MRSA Assay (FDA approved for NASAL specimens only), is one component of a comprehensive MRSA colonization surveillance program. It is not intended to diagnose MRSA infection nor to guide or monitor treatment for MRSA infections. RESULT CALLED TO, READ BACK BY AND VERIFIED WITH: Ladona Mow @0250  07/03/15 MKELLY   Culture, expectorated sputum-assessment     Status: None   Collection Time: 07/05/15  9:27 AM  Result Value Ref Range Status   Specimen Description SPUTUM  Final   Special Requests NONE  Final   Sputum evaluation THIS SPECIMEN IS ACCEPTABLE FOR SPUTUM CULTURE  Final   Report Status 07/05/2015 FINAL  Final  Culture, respiratory (NON-Expectorated)     Status: Abnormal (Preliminary result)   Collection Time: 07/05/15 10:01 AM  Result Value Ref Range Status     Specimen Description SPUTUM  Final   Special Requests NONE  Final   Gram Stain   Final    MODERATE WBC PRESENT, PREDOMINANTLY PMN FEW SQUAMOUS EPITHELIAL CELLS PRESENT FEW GRAM POSITIVE COCCI IN PAIRS IN CLUSTERS Performed at [facility]    Culture (A)  Final    STAPHYLOCOCCUS AUREUS Note: RIFAMPIN AND GENTAMICIN SHOULD NOT BE USED AS SINGLE DRUGS FOR TREATMENT OF STAPH INFECTIONS. Performed at [facility]    Report Status PENDING  Incomplete     Studies: No results found.  Scheduled Meds: . acidophilus  1 capsule Oral Daily  . clonazePAM  0.5 mg Oral BID  . cyclobenzaprine  10 mg Oral QHS  . fenofibrate  160 mg Oral Q supper  . ferrous sulfate  325 mg Oral QODAY  . FLUoxetine  40 mg Oral QHS  . gabapentin  100 mg Oral TID  .  insulin aspart  0-15 Units Subcutaneous TID WC & HS  . insulin aspart  5 Units Subcutaneous TID WC  . insulin detemir  25 Units Subcutaneous BID  . levothyroxine  37.5 mcg Oral QAC breakfast  . loratadine  10 mg Oral Daily  . metoprolol tartrate  50 mg Oral BID  . montelukast  10 mg Oral Daily  . pantoprazole  40 mg Oral Daily  . piperacillin-tazobactam (ZOSYN)  IV  3.375 g Intravenous 3 times per day  . rOPINIRole  2 mg Oral TID  . vancomycin  750 mg Intravenous Q12H    Continuous Infusions:    Time spent: 86mins  Tycen Dockter MD,   [facility] Pager [phone]. If 7PM-7AM, please contact night-coverage at [email or web address], password [facility] 07/08/2015, 8:37 AM  LOS: 6 days

## 2015-07-08 NOTE — Progress Notes (Signed)
prog PULMONARY / CRITICAL CARE MEDICINE   Name: [name] MRN: [id number] DOB: 1951-02-03    ADMISSION DATE:  07/02/2015  REFERRING MD:  EDP  CHIEF COMPLAINT:  Empyema   HISTORY OF PRESENT ILLNESS:   65yo female with hx asthma, DM, HTN, OSA, recently treated as outpt for PNA with levaquin (last dose day of admission) presented to ER 4/12 with 2 days worsening fatigue, malaise, SOB.  CT chest revealed dense RLL PNA with questionable empyema.  Lactate negative, afebrile, VSS.  PCCM called to admit.   Per pt she initially felt significant improvement once started on levaquin.  Over last 2 days has had increased fatigue, cough, malaise, dyspnea.  Denies fevers, chills, hemoptysis, BLE edema.    SUBJECTIVE: Feels better today, having DOE.Marland Kitchen Sitting in chair  VITAL SIGNS: BP 125/73 mmHg  Pulse 71  Temp(Src) 97.8 F (36.6 C) (Oral)  Resp 19  Ht 5\' 1"  (1.549 m)  Wt 230 lb 6.1 oz (104.5 kg)  BMI 43.55 kg/m2  SpO2 98%  HEMODYNAMICS:    VENTILATOR SETTINGS:    INTAKE / OUTPUT: I/O last 3 completed shifts: In: 1100 [P.O.:600; IV Piggyback:500] Out: 0   PHYSICAL EXAMINATION: General:  Pleasant, obese female, NAD , reports less sob but still with DOE Neuro:  Awake, alert, appropriate, MAE  HEENT:  Mm moist, no JVD  Cardiovascular:  S1s2, rrr Lungs:  resps even non labored, crackles R base Abdomen:  Round, soft, non tender,  Musculoskeletal:  Warm and dry, no edema   LABS:  BMET  Recent Labs Lab 07/05/15 0411 07/07/15 0627 07/08/15 0626  NA 140 138 137  K 4.3 4.6 4.4  CL 102 100* 96*  CO2 29 26 30   BUN 16 16 16   CREATININE 1.00 1.02* 1.09*  GLUCOSE 203* 221* 232*    Electrolytes  Recent Labs Lab 07/05/15 0411 07/07/15 0627 07/08/15 0626  CALCIUM 8.8* 9.0 9.0    CBC  Recent Labs Lab 07/02/15 1105 07/07/15 0627 07/08/15 0626  WBC 9.5 4.4 4.0  HGB 10.2* 9.9* 9.7*  HCT 31.7* 31.6* 31.4*  PLT 292 253 266    Coag's  Recent Labs Lab  07/05/15 0411  INR 1.29    Sepsis Markers  Recent Labs Lab 07/02/15 1114  LATICACIDVEN 0.73    ABG No results for input(s): PHART, PCO2ART, PO2ART in the last 168 hours.  Liver Enzymes  Recent Labs Lab 07/05/15 0411 07/08/15 0626  AST 16 15  ALT 19 16  ALKPHOS 60 64  BILITOT 0.4 0.7  ALBUMIN 2.2* 2.4*    Cardiac Enzymes No results for input(s): TROPONINI, PROBNP in the last 168 hours.  Glucose  Recent Labs Lab 07/06/15 2150 07/07/15 0805 07/07/15 1208 07/07/15 1701 07/07/15 2149 07/08/15 0821  GLUCAP 211* 182* 100* 90 242* 163*    Imaging No results found.   STUDIES:  CT chest 4/12>>>dense RLL consolidation with central cavitation and associated complex pleural effusion which is likely empyema.   4/16 rt empyema  CULTURES: BC x 2 4/12>>> Sputum 4/12>>>mrsa  ANTIBIOTICS: Vanco 4/12>>> Zoysn 4/17>> ceftaz 4/12>>>off clinda 4/12>>>off  SIGNIFICANT EVENTS:   LINES/TUBES:   DISCUSSION: 65yo female failed outpt rx for CAP now with dense RLL PNA and with ?empyema.  4/18 she feels better .  ASSESSMENT / PLAN:  CAP -- failed outpt rx , MRSA in sputum ?empyema -- unclear.  Small fluid pocket.  Looks great clinically.    PLAN -  Sputum 4/15 MRSA Blood cultures > ?  drawn IV abx -- vanc, zoysn per ID PRN BD  Supplemental O2 as needed to keep SpO2 > 92% Pulmonary hygiene  Would have CVTS eval although doubt acute indication for VATS, favor IV abx and monitor clinically and by CXR. Should have a repeat CT chest in several days to look for interval resolution. CVTS consult noted Avoid steroids  Ibuprofen for pleuritic pain PCCM will be available PRN   Updates: Pt updated by [facility] 4/13 and by SM 4/14 and 4/18. She reports she is getting a thoracentesis today but no order or documentation to support that currently.    Richardson Landry Minor ACNP Maryanna Shape PCCM Pager [phone] till 3 pm If no answer page [phone] 07/08/2015, 11:01 AM

## 2015-07-08 NOTE — Progress Notes (Signed)
Pt placed self on cpap for the night will monitor

## 2015-07-08 NOTE — Progress Notes (Signed)
Pt placed self on cpap for the night. tol well

## 2015-07-09 ENCOUNTER — Inpatient Hospital Stay (HOSPITAL_COMMUNITY): Payer: 59

## 2015-07-09 LAB — GLUCOSE, CAPILLARY
Glucose-Capillary: 141 mg/dL — ABNORMAL HIGH (ref 65–99)
Glucose-Capillary: 125 mg/dL — ABNORMAL HIGH (ref 65–99)
Glucose-Capillary: 244 mg/dL — ABNORMAL HIGH (ref 65–99)
Glucose-Capillary: 156 mg/dL — ABNORMAL HIGH (ref 65–99)

## 2015-07-09 LAB — VANCOMYCIN, TROUGH: Vancomycin Tr: 18 ug/mL (ref 10.0–20.0)

## 2015-07-09 MED ORDER — HYDROCODONE-HOMATROPINE 5-1.5 MG/5ML PO SYRP
5.0000 mL | ORAL_SOLUTION | Freq: Four times a day (QID) | ORAL | Status: DC | PRN
  Administered 2015-07-09 – 2015-07-10 (×4): 5 mL via ORAL
  Filled 2015-07-09 (×4): qty 5

## 2015-07-09 NOTE — Progress Notes (Signed)
Earlier today patient's IV infiltrated, only half of zosyn and one-fourth of Vanc went through.  Pharmacist made aware.

## 2015-07-09 NOTE — Progress Notes (Signed)
Pharmacy Antibiotic Note  [name] is a 65 y.o. female admitted on 07/02/2015 with pneumonia with possible empyema.Pharmacy has been consulted for Vancomycin + Zosyn dosing.  Chest CT on 4/17 showed decrease in R-sided empyema. IR consulted for thoracentesis on 4/19 but not enough fluid to tap. RCx from 4/15 growing MRSA (S-Vanc). Tmax/24h: 99.7, WBC wnl, SCr 1.09, CrCl~60 ml/min.   A Vancomycin trough level this morning resulted as therapeutic (VT 18 mcg/ml, goal of 15-20 mcg/ml). Current doses remain appropriate.  Plan: 1. Continue Vancomycin 750 mg IV every 12 hours 2. Continue Zosyn 3.375g IV every 8 hours (infused over 4 hours) 3. Will continue to follow renal function, culture results, LOT, and antibiotic de-escalation plans   Height: 5\' 1"  (154.9 cm) Weight: 230 lb 6.1 oz (104.5 kg) IBW/kg (Calculated) : 47.8  Temp (24hrs), Avg:98.9 F (37.2 C), Min:98 F (36.7 C), Max:99.7 F (37.6 C)   Recent Labs Lab 07/02/15 1105 07/02/15 1114 07/05/15 0411 07/06/15 1830 07/07/15 0627 07/08/15 0626 07/09/15 0520  WBC 9.5  --   --   --  4.4 4.0  --   CREATININE 1.11*  --  1.00  --  1.02* 1.09*  --   LATICACIDVEN  --  0.73  --   --   --   --   --   VANCOTROUGH  --   --   --  10  --   --  18    Estimated Creatinine Clearance: 58.8 mL/min (by C-G formula based on Cr of 1.09).    Allergies  Allergen Reactions  . Codeine Nausea Only  . Imitrex [Sumatriptan] Other (See Comments)    Facial spasms  . Morphine Other (See Comments)    Severe headaches   . Nsaids Other (See Comments)    PT UNABLE TO TOLERATE NSAID'S DRUGS DUE TO HX OF GASTRIC SLEEVE SURGERY  . Promethazine Hcl Other (See Comments)    Restless leg feeling all over body  . Statins Other (See Comments)    Leg/muscle pain  . Sulfonamide Derivatives Hives  . Vicodin [Hydrocodone-Acetaminophen] Nausea And Vomiting    Antimicrobials this admission: Levaquin PTA for CAP Vanc 4/12>> Ceftaz 4/12>>4/17 Clinda  4/12>>4/17 Zosyn x1 4/12; 4/17>>  Dose adjustments this admission: * 4/16 VT = 10 on 1250 q24> incr to 750 q12 * 4/19 VT 18 mcg/ml >> cont 750 mg/12h  Microbiology results: 4/12 MRSA PCR >> positive 4/15 RCx >> MRSA (S-Vanc MIC <0.5)  Thank you for allowing pharmacy to be a part of this patient's care.  Alycia Rossetti, PharmD, BCPS Clinical Pharmacist Pager: [phone] 07/09/2015 11:07 AM

## 2015-07-09 NOTE — Progress Notes (Signed)
Pt will place on mask when she is ready for bed. No distress noted. Pt encouraged to call RT if needing any assistance.

## 2015-07-09 NOTE — Progress Notes (Signed)
Patient ID: [name], female   DOB: 1950-06-05, 65 y.o.   MRN: [id number]   Request for Right thoracentesis  Limited US Chest reveals almost NO fluid in pleural space  NO Thoracentesis performed today Pt back to room

## 2015-07-09 NOTE — Progress Notes (Addendum)
PROGRESS NOTE  [name] [id number] DOB: [birth year] DOA: 07/02/2015 PCP: Jerlyn Ly, MD    Assessment/Plan: Active Problems:   Empyema (HCC)   Insulin dependent diabetes mellitus (HCC)   OSA on CPAP   Acute respiratory failure (HCC)   Empyema of right pleural space (HCC)   Brief summary 65yo female with hx asthma, DM, HTN, OSA, recently treated as outpt for PNA with levaquin (last dose day of admission) presented to ER 4/12 with 2 days worsening fatigue, malaise, SOB. CT chest revealed dense RLL PNA with questionable empyema. Lactate negative, afebrile, VSS. PCCM called to admit.   Per pt she initially felt significant improvement once started on levaquin. Over last 2 days has had increased fatigue, cough, malaise, dyspnea.CT scan showed an area consolidation at at the right base with a parapneumonic effusion. She responded symptomatically to the IV antibiotics-vancomycin and Zosyn-with improved strength, resolution of her fever and normal white count. She still has some pleuritic right chest pain site follow-up CT scan was performed   which showed persistent right lower lobe parenchymal density but the effusion has decreased in size. Patient evaluated by cardiothoracic surgery, deemed to be an extremely high risk for VATS procedure . TRH pick up from pccm on 4/14, pccm   Assessment and plan Complicated community-acquired pneumonia with empyema Ct chest on admission: Cavitating right lower lobe pneumonia with small but complex pleural effusion that is likely empyema. Repeat CT scan 4/17 shows persistent right-sided empyema On broad spectrum abx,     4/16 reported worsening of pleuritic chest pain, no sob, k cardiothoracic surgery consultation. Requested Dr. Nils Pyle   Evaluated pt,Cardiothoracic surgery   does not recommend VATS  at this time. Recommend to continue IV antibiotics  For several more days and transitioned to oral antibiotics and repeat CT scan in 1 week  after discharge. Discussed with infectious disease Dr. Raul Del recommends continuing  patient on  vancomycin and Zosyn, await further culture results from her empyema which we were unable to obtain on 4/18,  patient would possibly need 2 more weeks of antibiotics   Would continue with IV antibiotics until   4/21 and then just IV vanc ,unable to switch to linezolid due to risk of serotonin syndrome as per pharmacy as patient takes prozac at home  Sputum culture positive for MRSA  Insulin dependent dm2, continue insulin, a1c 7.9   started on NovoLog 5 units 3 times a day before meals  HTN/HLD/hypothyroidism , stable on home meds.  osa on cpap  Code Status: full  Family Communication: patient and husband  Disposition Plan: Anticipate continuation of IV antibiotics to  the end of the week, the DC on iv vanc    Consultants:  pccm admit on 4/12  TRH pick up on 4/14, pccm consult from 4/14  Thoracic surgery consultation  Infectious disease consultation - Via phone  Procedures:  none   Antibiotics: Zosyn 4/12 Vancomycin 4 /12 - Ceftazidime 4/12-4/17 Clindamycin 4/12-4/17 Zosyn 4/17   Subjective- Still complaining of right-sided chest pain with deep inspiration    Objective: BP 139/47 mmHg  Pulse 70  Temp(Src) 98.4 F (36.9 C) (Oral)  Resp 18  Ht 5\' 1"  (1.549 m)  Wt 104.5 kg (230 lb 6.1 oz)  BMI 43.55 kg/m2  SpO2 100%  Intake/Output Summary (Last 24 hours) at 07/09/15 1058 Last data filed at 07/09/15 0643  Gross per 24 hour  Intake   1340 ml  Output      0 ml  Net   1340 ml   Filed Weights   07/02/15 1945 07/05/15 2016  Weight: 95.528 kg (210 lb 9.6 oz) 104.5 kg (230 lb 6.1 oz)    Exam:   General:  NAD  Cardiovascular: RRR  Respiratory: diminished right lower lobe  Abdomen: Soft/ND/NT, positive BS  Musculoskeletal: No Edema  Neuro: aaox3  Data Reviewed: Basic Metabolic Panel:  Recent Labs Lab 07/02/15 1105 07/05/15 0411 07/07/15 0627  07/08/15 0626  NA 137 140 138 137  K 5.0 4.3 4.6 4.4  CL 99* 102 100* 96*  CO2 27 29 26 30   GLUCOSE 208* 203* 221* 232*  BUN 44* 16 16 16   CREATININE 1.11* 1.00 1.02* 1.09*  CALCIUM 9.7 8.8* 9.0 9.0   Liver Function Tests:  Recent Labs Lab 07/05/15 0411 07/08/15 0626  AST 16 15  ALT 19 16  ALKPHOS 60 64  BILITOT 0.4 0.7  PROT 6.4* 7.1  ALBUMIN 2.2* 2.4*   No results for input(s): LIPASE, AMYLASE in the last 168 hours. No results for input(s): AMMONIA in the last 168 hours. CBC:  Recent Labs Lab 07/02/15 1105 07/07/15 0627 07/08/15 0626  WBC 9.5 4.4 4.0  HGB 10.2* 9.9* 9.7*  HCT 31.7* 31.6* 31.4*  MCV 88.1 86.8 88.0  PLT 292 253 266   Cardiac Enzymes:   No results for input(s): CKTOTAL, CKMB, CKMBINDEX, TROPONINI in the last 168 hours. BNP (last 3 results) No results for input(s): BNP in the last 8760 hours.  ProBNP (last 3 results) No results for input(s): PROBNP in the last 8760 hours.  CBG:  Recent Labs Lab 07/08/15 0821 07/08/15 1153 07/08/15 1626 07/08/15 2043 07/09/15 0805  GLUCAP 163* 215* 257* 202* 141*    Recent Results (from the past 240 hour(s))  MRSA PCR Screening     Status: Abnormal   Collection Time: 07/02/15 10:55 PM  Result Value Ref Range Status   MRSA by PCR POSITIVE (A) NEGATIVE Final    Comment:        The GeneXpert MRSA Assay (FDA approved for NASAL specimens only), is one component of a comprehensive MRSA colonization surveillance program. It is not intended to diagnose MRSA infection nor to guide or monitor treatment for MRSA infections. RESULT CALLED TO, READ BACK BY AND VERIFIED WITH: Ladona Mow @0250  07/03/15 MKELLY   Culture, expectorated sputum-assessment     Status: None   Collection Time: 07/05/15  9:27 AM  Result Value Ref Range Status   Specimen Description SPUTUM  Final   Special Requests NONE  Final   Sputum evaluation THIS SPECIMEN IS ACCEPTABLE FOR SPUTUM CULTURE  Final   Report Status 07/05/2015  FINAL  Final  Culture, respiratory (NON-Expectorated)     Status: Abnormal   Collection Time: 07/05/15 10:01 AM  Result Value Ref Range Status   Specimen Description SPUTUM  Final   Special Requests NONE  Final   Gram Stain   Final    MODERATE WBC PRESENT, PREDOMINANTLY PMN FEW SQUAMOUS EPITHELIAL CELLS PRESENT FEW GRAM POSITIVE COCCI IN PAIRS IN CLUSTERS Performed at [facility]    Culture (A)  Final    METHICILLIN RESISTANT STAPHYLOCOCCUS AUREUS Note: RIFAMPIN AND GENTAMICIN SHOULD NOT BE USED AS SINGLE DRUGS FOR TREATMENT OF STAPH INFECTIONS. This organism DOES NOT demonstrate inducible Clindamycin resistance in vitro. CRITICAL RESULT CALLED TO, READ BACK BY AND VERIFIED WITH: JANE RN 4.18.17  [id number] BY PERCN Performed at [facility]    Report Status 07/08/2015 FINAL  Final  Organism ID, Bacteria METHICILLIN RESISTANT STAPHYLOCOCCUS AUREUS  Final      Susceptibility   Methicillin resistant staphylococcus aureus - MIC*    CLINDAMYCIN <=0.25 SENSITIVE Sensitive     ERYTHROMYCIN >=8 RESISTANT Resistant     GENTAMICIN <=0.5 SENSITIVE Sensitive     LEVOFLOXACIN 4 INTERMEDIATE Intermediate     OXACILLIN >=4 RESISTANT Resistant     RIFAMPIN <=0.5 SENSITIVE Sensitive     TRIMETH/SULFA <=10 SENSITIVE Sensitive     VANCOMYCIN <=0.5 SENSITIVE Sensitive     TETRACYCLINE <=1 SENSITIVE Sensitive     * METHICILLIN RESISTANT STAPHYLOCOCCUS AUREUS     Studies: [place] Chest  07/09/2015  CLINICAL DATA:  Empyema RIGHT pleural space EXAM: CHEST ULTRASOUND COMPARISON:  CT chest 07/06/2015 FINDINGS: Sonography of the RIGHT hemi thorax demonstrates presence of a small RIGHT pleural fluid collection which is complex, containing diffuse low level internal echogenicity. Volume of RIGHT pleural fluid identified is insufficient for thoracentesis. No other focal sonographic abnormalities identified. IMPRESSION: Complex RIGHT pleural fluid collection containing low level internal echoes,  insufficient in volume for thoracentesis. Electronically Signed   By: Lavonia Dana M.D.   On: 07/09/2015 09:37    Scheduled Meds: . acidophilus  1 capsule Oral Daily  . clonazePAM  0.5 mg Oral BID  . cyclobenzaprine  10 mg Oral QHS  . enoxaparin (LOVENOX) injection  40 mg Subcutaneous Q24H  . fenofibrate  160 mg Oral Q supper  . ferrous sulfate  325 mg Oral QODAY  . FLUoxetine  40 mg Oral QHS  . gabapentin  100 mg Oral TID  . insulin aspart  0-15 Units Subcutaneous TID WC & HS  . insulin aspart  5 Units Subcutaneous TID WC  . insulin detemir  25 Units Subcutaneous BID  . levothyroxine  37.5 mcg Oral QAC breakfast  . loratadine  10 mg Oral Daily  . metoprolol tartrate  50 mg Oral BID  . montelukast  10 mg Oral Daily  . pantoprazole  40 mg Oral Daily  . piperacillin-tazobactam (ZOSYN)  IV  3.375 g Intravenous 3 times per day  . rOPINIRole  2 mg Oral TID  . vancomycin  750 mg Intravenous Q12H    Continuous Infusions:    Time spent: 10mins  Andras Grunewald MD,   [facility] Pager [phone]. If 7PM-7AM, please contact night-coverage at [email or web address], password [facility] 07/09/2015, 10:57 AM  LOS: 7 days

## 2015-07-09 NOTE — Progress Notes (Signed)
Discussed PICC placement with patient. Patient requests a chance to speak with MD before consenting. Primary RN aware.

## 2015-07-10 ENCOUNTER — Inpatient Hospital Stay (HOSPITAL_COMMUNITY): Payer: 59

## 2015-07-10 DIAGNOSIS — G4733 Obstructive sleep apnea (adult) (pediatric): Secondary | ICD-10-CM

## 2015-07-10 DIAGNOSIS — J9601 Acute respiratory failure with hypoxia: Secondary | ICD-10-CM

## 2015-07-10 LAB — CBC
HCT: 30.6 % — ABNORMAL LOW (ref 36.0–46.0)
Hemoglobin: 9.5 g/dL — ABNORMAL LOW (ref 12.0–15.0)
MCH: 27.2 pg (ref 26.0–34.0)
MCHC: 31 g/dL (ref 30.0–36.0)
MCV: 87.7 fL (ref 78.0–100.0)
Platelets: 258 10*3/uL (ref 150–400)
RBC: 3.49 MIL/uL — ABNORMAL LOW (ref 3.87–5.11)
RDW: 14.1 % (ref 11.5–15.5)
WBC: 3.8 10*3/uL — ABNORMAL LOW (ref 4.0–10.5)

## 2015-07-10 LAB — COMPREHENSIVE METABOLIC PANEL
ALT: 14 U/L (ref 14–54)
AST: 12 U/L — ABNORMAL LOW (ref 15–41)
Albumin: 2.5 g/dL — ABNORMAL LOW (ref 3.5–5.0)
Alkaline Phosphatase: 62 U/L (ref 38–126)
Anion gap: 10 (ref 5–15)
BUN: 18 mg/dL (ref 6–20)
CO2: 30 mmol/L (ref 22–32)
Calcium: 9.4 mg/dL (ref 8.9–10.3)
Chloride: 96 mmol/L — ABNORMAL LOW (ref 101–111)
Creatinine, Ser: 1 mg/dL (ref 0.44–1.00)
GFR calc Af Amer: >60 mL/min (ref >60)
GFR calc non Af Amer: 59 mL/min — ABNORMAL LOW (ref >60)
Glucose, Bld: 228 mg/dL — ABNORMAL HIGH (ref 65–99)
Potassium: 4.3 mmol/L (ref 3.5–5.1)
Sodium: 136 mmol/L (ref 135–145)
Total Bilirubin: 0.3 mg/dL (ref 0.3–1.2)
Total Protein: 7.3 g/dL (ref 6.5–8.1)

## 2015-07-10 LAB — GLUCOSE, CAPILLARY
Glucose-Capillary: 217 mg/dL — ABNORMAL HIGH (ref 65–99)
Glucose-Capillary: 264 mg/dL — ABNORMAL HIGH (ref 65–99)
Glucose-Capillary: 245 mg/dL — ABNORMAL HIGH (ref 65–99)

## 2015-07-10 MED ORDER — INSULIN DETEMIR 100 UNIT/ML ~~LOC~~ SOLN
28.0000 [IU] | Freq: Two times a day (BID) | SUBCUTANEOUS | Status: DC
  Administered 2015-07-10 – 2015-07-11 (×2): 28 [IU] via SUBCUTANEOUS
  Filled 2015-07-10 (×3): qty 0.28

## 2015-07-10 NOTE — Progress Notes (Signed)
Inpatient Diabetes Program Recommendations  AACE/ADA: New Consensus Statement on Inpatient Glycemic Control (2015)  Target Ranges:  Prepandial:   less than 140 mg/dL      Peak postprandial:   less than 180 mg/dL (1-2 hours)      Critically ill patients:  140 - 180 mg/dL   Results for [name], [name] (MRN [id number]) as of 07/10/2015 13:23  Ref. Range 07/09/2015 08:05 07/09/2015 12:17 07/09/2015 17:20 07/09/2015 20:54 07/10/2015 08:22 07/10/2015 11:51  Glucose-Capillary Latest Ref Range: 65-99 mg/dL 141 (H) 125 (H) 244 (H) 156 (H) 217 (H) 264 (H)   Review of Glycemic Control  Inpatient Diabetes Program Recommendations:  Insulin - Basal: Glucose elevated still. Consider increasing Levemir to 28 units BID. May also need to increase meal coverage a couple of units.  Thanks,  Tama Headings RN, MSN, [facility] Inpatient Diabetes Coordinator Team Pager [phone] (8a-5p)

## 2015-07-10 NOTE — Care Management Note (Signed)
Case Management Note  Patient Details  Name: [name] MRN: [id number] Date of Birth: 07-19-1950  Subjective/Objective:         CM following for progression and d/c planning.           Action/Plan: 07/10/2015 Met with pt who has had IV antibiotics at home previously so is familiar with the process. She has used [facility] in the past and wishes to use that agency again. PICC will be placed on 07/11/15 am and plan for d/c in the pm. [facility] notified of need for [facility] and IV services. Await prescription, was able to provide [facility] with some info re drug and duration, dosage and frequency unclear. Will clarify with MD in am.   Expected Discharge Date:    07/11/2015              Expected Discharge Plan:  [place]  In-House Referral:  NA  Discharge planning Services  CM Consult  Post Acute Care Choice:  Home Health Choice offered to:  Patient  DME Arranged:  IV pump/equipment DME Agency:  [place]:  RN [facility] Agency:  [place]  Status of Service:  Completed, signed off  Medicare Important Message Given:    Date Medicare IM Given:    Medicare IM give by:    Date Additional Medicare IM Given:    Additional Medicare Important Message give by:     If discussed at [place] of Stay Meetings, dates discussed:    Additional Comments:  Adron Bene, RN 07/10/2015, 4:02 PM

## 2015-07-10 NOTE — Progress Notes (Signed)
CPAP is bedside and ready for use.  Patient stated she will place on herself when ready for bed and does not need any assistance.

## 2015-07-10 NOTE — Progress Notes (Addendum)
PROGRESS NOTE  [name] [id number] DOB: [birth year] DOA: 07/02/2015 PCP: Jerlyn Ly, MD  HPI/Recap of past 24 hours: Patient is a 65 year old female past oral history of diabetes mellitus, morbid obesity, obstructive sleep apnea who was being treated as an outpatient for pneumonia with Levaquin when she presented on 4/12 (day of her last dose of Levaquin) with 2 days of increased shortness of breath and fatigue. CT scan of the chest noted a dense right lower lobe pneumonia with parapneumonic effusion/questionable empyema. Patient was admitted to the critical care service. Patient started on IV antibiotics which patient responded to. A follow-up CT scan noted persistent right lower lobe density but decreased effusion. Patient seen by cardiothoracic surgery and felt to be extremely high risk for VATS. Patient was transferred from critical care to hospitalist service on 4/14.  Patient today doing okay. Breathing comfortably. Has been able to ambulate in the hallway. She had concerns about a PICC line, these were relieved and answered her questions. No complaints.  Assessment/Plan: Active Problems:   Community-acquired pneumonia complicated by Empyema ([place]) causing acute respiratory failure: After discussion with infectious disease, recommendation was for 3 weeks total of antibiotics, specifically continuing vancomycin and Zosyn and awaiting further culture results from empyema. Unfortunately, this was not able to be done. Patient's sputum positive for MRSA. Recommendation is to continue Zosyn until 4/21 and then IV vancomycin until 5/3. Patient was supposed to have PICC line done yesterday, but had questions in regards to her IV antibiotics. Questions answered today. We'll plan for PICC placement.  Patient unable to tolerate linezolid due to serotonin syndrome risk as she is on Prozac at home.    Insulin dependent diabetes mellitus ([place]): A1c at 7.9. On NovoLog 5 units 3 times a day,  appreciate educator help and have increased Levemir dose    OSA on CPAP  Morbid obesity: Patient meets criteria with BMI greater than 40.  Code Status: Full code   Family Communication: Left message for husband   Disposition Plan: Anticipate discharge tomorrow    Consultants:  pccm admit on 4/12  [place] pick up on 4/14, pccm consult from 4/14  Thoracic surgery consultation  Infectious disease consultation - Via phone  Procedures:  none  Antibiotics:  Zosyn 4/12, 4/17-4/21 Vancomycin 4 /12 - Ceftazidime 4/12-4/17 Clindamycin 4/12-4/17    Objective: BP 125/46 mmHg  Pulse 70  Temp(Src) 98.3 F (36.8 C) (Oral)  Resp 18  Ht 5\' 1"  (1.549 m)  Wt 104.5 kg (230 lb 6.1 oz)  BMI 43.55 kg/m2  SpO2 100%  Intake/Output Summary (Last 24 hours) at 07/10/15 1517 Last data filed at 07/10/15 1436  Gross per 24 hour  Intake    920 ml  Output      0 ml  Net    920 ml   Filed Weights   07/02/15 1945 07/05/15 2016  Weight: 95.528 kg (210 lb 9.6 oz) 104.5 kg (230 lb 6.1 oz)    Exam:   General:  Alert & oriented x 3, no acute distress  Cardiovascular: regular rate & rhythm, S1S2   Respiratory: Decreased breath sounds throughout   Abdomen: Soft, obese, nontender, positive bowel sounds   Musculoskeletal: Trace pitting edema    Data Reviewed: Basic Metabolic Panel:  Recent Labs Lab 07/05/15 0411 07/07/15 0627 07/08/15 0626 07/10/15 0829  NA 140 138 137 136  K 4.3 4.6 4.4 4.3  CL 102 100* 96* 96*  CO2 29 26 30 30   GLUCOSE 203* 221* 232* 228*  BUN 16 16 16 18   CREATININE 1.00 1.02* 1.09* 1.00  CALCIUM 8.8* 9.0 9.0 9.4   Liver Function Tests:  Recent Labs Lab 07/05/15 0411 07/08/15 0626 07/10/15 0829  AST 16 15 12*  ALT 19 16 14   ALKPHOS 60 64 62  BILITOT 0.4 0.7 0.3  PROT 6.4* 7.1 7.3  ALBUMIN 2.2* 2.4* 2.5*   No results for input(s): LIPASE, AMYLASE in the last 168 hours. No results for input(s): AMMONIA in the last 168 hours. CBC:  Recent  Labs Lab 07/07/15 0627 07/08/15 0626 07/10/15 0829  WBC 4.4 4.0 3.8*  HGB 9.9* 9.7* 9.5*  HCT 31.6* 31.4* 30.6*  MCV 86.8 88.0 87.7  PLT 253 266 258   Cardiac Enzymes:   No results for input(s): CKTOTAL, CKMB, CKMBINDEX, TROPONINI in the last 168 hours. BNP (last 3 results) No results for input(s): BNP in the last 8760 hours.  ProBNP (last 3 results) No results for input(s): PROBNP in the last 8760 hours.  CBG:  Recent Labs Lab 07/09/15 1217 07/09/15 1720 07/09/15 2054 07/10/15 0822 07/10/15 1151  GLUCAP 125* 244* 156* 217* 264*    Recent Results (from the past 240 hour(s))  MRSA PCR Screening     Status: Abnormal   Collection Time: 07/02/15 10:55 PM  Result Value Ref Range Status   MRSA by PCR POSITIVE (A) NEGATIVE Final    Comment:        The GeneXpert MRSA Assay (FDA approved for NASAL specimens only), is one component of a comprehensive MRSA colonization surveillance program. It is not intended to diagnose MRSA infection nor to guide or monitor treatment for MRSA infections. RESULT CALLED TO, READ BACK BY AND VERIFIED WITH: Ladona Mow @0250  07/03/15 MKELLY   Culture, expectorated sputum-assessment     Status: None   Collection Time: 07/05/15  9:27 AM  Result Value Ref Range Status   Specimen Description SPUTUM  Final   Special Requests NONE  Final   Sputum evaluation THIS SPECIMEN IS ACCEPTABLE FOR SPUTUM CULTURE  Final   Report Status 07/05/2015 FINAL  Final  Culture, respiratory (NON-Expectorated)     Status: Abnormal   Collection Time: 07/05/15 10:01 AM  Result Value Ref Range Status   Specimen Description SPUTUM  Final   Special Requests NONE  Final   Gram Stain   Final    MODERATE WBC PRESENT, PREDOMINANTLY PMN FEW SQUAMOUS EPITHELIAL CELLS PRESENT FEW GRAM POSITIVE COCCI IN PAIRS IN CLUSTERS Performed at [facility]    Culture (A)  Final    METHICILLIN RESISTANT STAPHYLOCOCCUS AUREUS Note: RIFAMPIN AND GENTAMICIN SHOULD NOT BE  USED AS SINGLE DRUGS FOR TREATMENT OF STAPH INFECTIONS. This organism DOES NOT demonstrate inducible Clindamycin resistance in vitro. CRITICAL RESULT CALLED TO, READ BACK BY AND VERIFIED WITH: JANE RN 4.18.17  [phone] BY PERCN Performed at [facility]    Report Status 07/08/2015 FINAL  Final   Organism ID, Bacteria METHICILLIN RESISTANT STAPHYLOCOCCUS AUREUS  Final      Susceptibility   Methicillin resistant staphylococcus aureus - MIC*    CLINDAMYCIN <=0.25 SENSITIVE Sensitive     ERYTHROMYCIN >=8 RESISTANT Resistant     GENTAMICIN <=0.5 SENSITIVE Sensitive     LEVOFLOXACIN 4 INTERMEDIATE Intermediate     OXACILLIN >=4 RESISTANT Resistant     RIFAMPIN <=0.5 SENSITIVE Sensitive     TRIMETH/SULFA <=10 SENSITIVE Sensitive     VANCOMYCIN <=0.5 SENSITIVE Sensitive     TETRACYCLINE <=1 SENSITIVE Sensitive     *  METHICILLIN RESISTANT STAPHYLOCOCCUS AUREUS     Studies: Dg Chest 2 View  07/10/2015  CLINICAL DATA:  Short of breath EXAM: CHEST  2 VIEW COMPARISON:  07/04/2015 FINDINGS: Pleural thickening at the right base is stable. Heterogeneous opacities in the central and basilar right lung are stable. Heart is normal in size. Left lung is relatively clear other than tiny linear opacities likely representing atelectasis or scar. No pneumothorax. IMPRESSION: Stable airspace disease in the right lower lobe with pleural thickening. Electronically Signed   By: Marybelle Killings M.D.   On: 07/10/2015 08:03    Scheduled Meds: . acidophilus  1 capsule Oral Daily  . clonazePAM  0.5 mg Oral BID  . cyclobenzaprine  10 mg Oral QHS  . enoxaparin (LOVENOX) injection  40 mg Subcutaneous Q24H  . fenofibrate  160 mg Oral Q supper  . ferrous sulfate  325 mg Oral QODAY  . FLUoxetine  40 mg Oral QHS  . gabapentin  100 mg Oral TID  . insulin aspart  0-15 Units Subcutaneous TID WC & HS  . insulin aspart  5 Units Subcutaneous TID WC  . insulin detemir  28 Units Subcutaneous BID  . levothyroxine  37.5 mcg  Oral QAC breakfast  . loratadine  10 mg Oral Daily  . metoprolol tartrate  50 mg Oral BID  . montelukast  10 mg Oral Daily  . pantoprazole  40 mg Oral Daily  . piperacillin-tazobactam (ZOSYN)  IV  3.375 g Intravenous 3 times per day  . rOPINIRole  2 mg Oral TID  . vancomycin  750 mg Intravenous Q12H    Continuous Infusions:    Time spent: 15 minutes   [facility] Pager [phone] . If 7PM-7AM, please contact night-coverage at [email or web address], password [facility] 07/10/2015, 3:17 PM  LOS: 8 days

## 2015-07-10 NOTE — Progress Notes (Signed)
Spoke with primary RN.   Pt wanting to talk with MD regarding IV antibiotics and needing a PICC.   She may be going with PO medications instead.   Still waiting for MD to round.   Informed that PICC may not be inserted until tomorrow.  Has a working PIV.

## 2015-07-11 LAB — GLUCOSE, CAPILLARY
Glucose-Capillary: 258 mg/dL — ABNORMAL HIGH (ref 65–99)
Glucose-Capillary: 137 mg/dL — ABNORMAL HIGH (ref 65–99)

## 2015-07-11 MED ORDER — SODIUM CHLORIDE 0.9% FLUSH: 10.0000 mL | INTRAVENOUS | Status: DC | PRN

## 2015-07-11 MED ORDER — VANCOMYCIN HCL IN DEXTROSE 750-5 MG/150ML-% IV SOLN: 750.0000 mg | Freq: Two times a day (BID) | INTRAVENOUS | Status: DC

## 2015-07-11 MED ORDER — VANCOMYCIN HCL IN DEXTROSE 750-5 MG/150ML-% IV SOLN: 750.0000 mg | Freq: Two times a day (BID) | INTRAVENOUS | Status: AC

## 2015-07-11 MED ORDER — SODIUM CHLORIDE 0.9% FLUSH: 10.0000 mL | Freq: Two times a day (BID) | INTRAVENOUS | Status: DC

## 2015-07-11 NOTE — Progress Notes (Signed)
Peripherally Inserted Central Catheter/Midline Placement  The IV Nurse has discussed with the patient and/or persons authorized to consent for the patient, the purpose of this procedure and the potential benefits and risks involved with this procedure.  The benefits include less needle sticks, lab draws from the catheter and patient may be discharged home with the catheter.  Risks include, but not limited to, infection, bleeding, blood clot (thrombus formation), and puncture of an artery; nerve damage and irregular heat beat.  Alternatives to this procedure were also discussed.  PICC/Midline Placement Documentation  PICC Single Lumen [id number] PICC Right Basilic 40 cm 1 cm (Active)  Indication for Insertion or Continuance of Line Home intravenous therapies (PICC only) 07/11/2015 12:21 PM  Exposed Catheter (cm) 1 cm 07/11/2015 12:21 PM  Site Assessment Clean;Dry;Intact 07/11/2015 12:21 PM  Line Status Flushed;Saline locked;Blood return noted 07/11/2015 12:21 PM  Dressing Type Transparent 07/11/2015 12:21 PM  Dressing Status Clean;Dry;Intact 07/11/2015 12:21 PM  Dressing Intervention New dressing 07/11/2015 12:21 PM  Dressing Change Due 07/18/15 07/11/2015 12:21 PM       [place] Payment 07/11/2015, 12:22 PM

## 2015-07-11 NOTE — Progress Notes (Signed)
Patient Discharge: Disposition: Patient discharged to home with PICC line and also [facility] heath services. Education: Reviewed medication, prescription, follow-up appointment and discharge instructions, understood and acknowledged. IV: Peripheral IV discontinued but patient left with PICC line as she needs more IV ABT. Telemetry: Discontinued Tele, CCMD notified. Transportation: Patient escorted in w/c out of the unit. Belongings: Patient took all her belongings with her.

## 2015-07-11 NOTE — Progress Notes (Signed)
Inpatient Diabetes Program Recommendations  AACE/ADA: New Consensus Statement on Inpatient Glycemic Control (2015)  Target Ranges:  Prepandial:   less than 140 mg/dL      Peak postprandial:   less than 180 mg/dL (1-2 hours)      Critically ill patients:  140 - 180 mg/dL   Results for KENTASIA, VANCIL (MRN [id number]) as of 07/11/2015 08:55  Ref. Range 07/10/2015 08:22 07/10/2015 11:51 07/10/2015 17:28  Glucose-Capillary Latest Ref Range: 65-99 mg/dL 217 (H) 264 (H) 245 (H)   Review of Glycemic Control  Diabetes history: DM 2 Outpatient Diabetes medications: Lantus 35 units BID, Novolog 5-25 units TID meal coverage Current orders for Inpatient glycemic control: Lantus 28 BID, Novolog Moderate + Novolog 5 units meal coverage  Inpatient Diabetes Program Recommendations: Insulin - Basal: Glucose still elevated in the 260's this am with increase in basal insulin. Patient takes 35 units of basal BID at home. Please consider increasing basal insulin again to 32 units BID. Insulin - Meal Coverage: Patient takes 5-25 units of meal coverage at home. Glucose increases at meal times still. Please consider increasing meal coverage to 8 units TID with meals.  Thanks,  Tama Headings RN, MSN, [facility] Inpatient Diabetes Coordinator Team Pager [phone] (8a-5p)

## 2015-07-11 NOTE — Progress Notes (Signed)
Per pharmacist was ok to give Zosyn within 30 min as she was ready to get discharged.

## 2015-07-11 NOTE — Discharge Summary (Signed)
Discharge Summary  [name] [id number] DOB: 22-Sep-1950  PCP: Jerlyn Ly, MD  Admit date: 07/02/2015 Discharge date: 07/11/2015  Time spent: 25 minutes   Recommendations for Outpatient Follow-up:  1. Patient going home with home health nurse for IV antibiotics 2. IV vancomycin 750 mg every 12 hours 12 days (home health will check levels and adjust dose accordingly) 3. Cardiothoracic surgery will follow-up with patient and schedule repeat chest CT in approximately one week 4. Patient will follow-up with her PCP in 1 month  Discharge Diagnoses:  Active Hospital Problems   Diagnosis Date Noted  . Empyema of right pleural space ([place])   . Acute respiratory failure ([name])   . Insulin dependent diabetes mellitus ([place])   . OSA on CPAP   . Empyema ([name]) 07/02/2015    Resolved Hospital Problems   Diagnosis Date Noted Date Resolved  No resolved problems to display.    Discharge Condition: Improved, being discharged home   Diet recommendation: Carb modified, low sodium   Filed Vitals:   07/11/15 0756 07/11/15 0944  BP: 129/47 133/56  Pulse: 76   Temp: 98.1 F (36.7 C)   Resp: 18     History of present illness:  Patient is a 65 year old female past oral history of diabetes mellitus, morbid obesity, obstructive sleep apnea who was being treated as an outpatient for pneumonia with Levaquin when she presented on 4/12 (day of her last dose of Levaquin) with 2 days of increased shortness of breath and fatigue. CT scan of the chest noted a dense right lower lobe pneumonia with parapneumonic effusion/questionable empyema. Patient was admitted to the critical care service.   Hospital Course:  Active Problems:   Community-acquired pneumonia complicated by Empyema ([name]) causing acute respiratory failure with hypoxia: Patient started on IV antibiotics which patient responded to. A follow-up CT scan noted persistent right lower lobe density but decreased effusion. Patient seen by  cardiothoracic surgery and felt to be extremely high risk for VATS. Patient was transferred from critical care to hospitalist service on 4/14.  After discussion with infectious disease, recommendation was for 3 weeks total of antibiotics, specifically continuing vancomycin and Zosyn and awaiting further culture results from empyema. Unfortunately, this was not able to be done. Patient's sputum positive for MRSA. Patient unable to tolerate linezolid due to serotonin syndrome risk as she is on Prozac at home.  Recommendation by infectious disease was to continue Zosyn until 4/21 and then IV vancomycin until 5/2.  Prior to discharge on 4/21, PICC line placed. Upon discharge, cardiothoracic surgery will follow-up with patient and get repeat CT in one week's time.    Insulin dependent diabetes mellitus ([place]): A1c of 7.9. Patient's Levemir dose was slowly titrated upwards and by time of discharge, back on home dose.   OSA on CPAP  Morbid obesity: Patient meets criteria with BMI greater than 40   Procedures:  PICC line placed for/21   Consultations:  pccm admit on 4/12  TRH pick up on 4/14, pccm consult from 4/14  Thoracic surgery consultation  Infectious disease consultation - Via phone   Discharge Exam: BP 133/56 mmHg  Pulse 76  Temp(Src) 98.1 F (36.7 C) (Oral)  Resp 18  Ht 5\' 1"  (1.549 m)  Wt 104.5 kg (230 lb 6.1 oz)  BMI 43.55 kg/m2  SpO2 98%  General: Alert and oriented 3, no acute distress  Cardiovascular:   regular rate and rhythm, S1-S2 Respiratory: decreased breath sounds throughout secondary to body habitus   Discharge  Instructions You were cared for by a hospitalist during your hospital stay. If you have any questions about your discharge medications or the care you received while you were in the hospital after you are discharged, you can call the unit and asked to speak with the hospitalist on call if the hospitalist that took care of you is not available. Once you are  discharged, your primary care physician will handle any further medical issues. Please note that NO REFILLS for any discharge medications will be authorized once you are discharged, as it is imperative that you return to your primary care physician (or establish a relationship with a primary care physician if you do not have one) for your aftercare needs so that they can reassess your need for medications and monitor your lab values.  Discharge Instructions    Diet - low sodium heart healthy    Complete by:  As directed      Increase activity slowly    Complete by:  As directed             Medication List    STOP taking these medications        levofloxacin 500 MG tablet  Commonly known as:  LEVAQUIN     oxyCODONE-acetaminophen 5-325 MG tablet  Commonly known as:  ROXICET     predniSONE 10 MG tablet  Commonly known as:  DELTASONE      TAKE these medications        albuterol 108 (90 Base) MCG/ACT inhaler  Commonly known as:  PROVENTIL HFA;VENTOLIN HFA  Inhale 2 puffs into the lungs every 6 (six) hours as needed for wheezing or shortness of breath. PROAIR     aspirin EC 81 MG tablet  Take 81 mg by mouth at bedtime.     b complex vitamins tablet  Take 1 tablet by mouth daily.     benzonatate 100 MG capsule  Commonly known as:  TESSALON  Take 1 capsule (100 mg total) by mouth every 8 (eight) hours.     cetirizine 10 MG tablet  Commonly known as:  ZYRTEC  Take 10 mg by mouth See admin instructions. Take 1 tablet (10 mg) twice daily during allergy season     CHLOR-TRIMETON PO  Take 1 tablet by mouth See admin instructions. Take 1 tablet four times daily during allergy season     clonazePAM 0.5 MG tablet  Commonly known as:  KLONOPIN  Take 0.5 mg by mouth 3 (three) times daily.     cyclobenzaprine 10 MG tablet  Commonly known as:  FLEXERIL  Take 10 mg by mouth See admin instructions. Take 1 tablet (10 mg) every night at bedtime, may take an additional tablet during the  day as needed for muscle spasms     dextromethorphan-guaiFENesin 30-600 MG 12hr tablet  Commonly known as:  MUCINEX DM  Take 1 tablet by mouth 2 (two) times daily as needed for cough (congestion).     diclofenac sodium 1 % Gel  Commonly known as:  VOLTAREN  Apply 1 application topically 2 (two) times daily as needed (arthritis pain - hands).     erythromycin ophthalmic ointment  Place 1 application into both eyes 2 (two) times daily. Started 07/18/14 for pink eye     fenofibrate 160 MG tablet  Take 160 mg by mouth daily with supper.     ferrous sulfate 325 (65 FE) MG tablet  Take 325 mg by mouth daily with breakfast.     Asencion Islam  ALLERGY RELIEF NA  Place 2 sprays into both nostrils See admin instructions. Use 2 sprays each nare daily at bedtime during allergy season     FLUoxetine 40 MG capsule  Commonly known as:  PROZAC  Take 40 mg by mouth at bedtime.     gabapentin 100 MG capsule  Commonly known as:  NEURONTIN  Take 100 mg by mouth 3 (three) times daily.     HAIR/SKIN/NAILS/BIOTIN Tabs  Take 1 tablet by mouth daily.     hydrochlorothiazide 12.5 MG capsule  Commonly known as:  MICROZIDE  Take 12.5 mg by mouth daily.     insulin aspart 100 UNIT/ML injection  Commonly known as:  novoLOG  Inject 5-25 Units into the skin 3 (three) times daily as needed for high blood sugar (CBG >120). Per sliding scale     insulin detemir 100 UNIT/ML injection  Commonly known as:  LEVEMIR  Inject 0.1 mLs (10 Units total) into the skin 2 (two) times daily.     levothyroxine 75 MCG tablet  Commonly known as:  SYNTHROID, LEVOTHROID  Take 37.5 mcg by mouth See admin instructions. Take 4 times a week only. Sunday, Tuesday, Thursday, and Saturday.     metoprolol 50 MG tablet  Commonly known as:  LOPRESSOR  Take 50 mg by mouth 3 (three) times daily.     mometasone 220 MCG/INH inhaler  Commonly known as:  ASMANEX  Inhale 1 puff into the lungs 2 (two) times daily.     montelukast 10 MG  tablet  Commonly known as:  SINGULAIR  Take 10 mg by mouth daily.     omeprazole 20 MG capsule  Commonly known as:  PRILOSEC  Take 20 mg by mouth daily with supper.     ondansetron 4 MG disintegrating tablet  Commonly known as:  ZOFRAN-ODT  Take 4 mg by mouth daily as needed (nausea from pain medication).     Phentermine-Topiramate 11.25-69 MG Cp24  Take 1 tablet by mouth daily.     potassium citrate 10 MEQ (1080 MG) SR tablet  Commonly known as:  UROCIT-K  Take 10 mEq by mouth 4 (four) times daily.     PROBIOTIC DAILY PO  Take 1 capsule by mouth daily.     rOPINIRole 2 MG tablet  Commonly known as:  REQUIP  Take 2 mg by mouth 4 (four) times daily.     Vancomycin 750 MG/150ML Soln  Commonly known as:  VANCOCIN  Inject 150 mLs (750 mg total) into the vein every 12 (twelve) hours.     zolpidem 5 MG tablet  Commonly known as:  AMBIEN  Take 5 mg by mouth at bedtime as needed for sleep.       Allergies  Allergen Reactions  . Codeine Nausea Only  . Imitrex [Sumatriptan] Other (See Comments)    Facial spasms  . Morphine Other (See Comments)    Severe headaches   . Nsaids Other (See Comments)    PT UNABLE TO TOLERATE NSAID'S DRUGS DUE TO HX OF GASTRIC SLEEVE SURGERY  . Promethazine Hcl Other (See Comments)    Restless leg feeling all over body  . Statins Other (See Comments)    Leg/muscle pain  . Sulfonamide Derivatives Hives  . Vicodin [Hydrocodone-Acetaminophen] Nausea And Vomiting       Follow-up Information    Follow up with Len Childs, MD.   Specialty:  Cardiothoracic Surgery   Why:  Office will call you to schedule CT scan in  1 week. (Call them if no word by Monday)   Contact information:   [place] [address] [phone]       Follow up with Crist Infante A, MD In 1 month.   Specialty:  Internal Medicine   Contact information:   [place] [address] [phone]        The results of  significant diagnostics from this hospitalization (including imaging, microbiology, ancillary and laboratory) are listed below for reference.    Significant Diagnostic Studies: Dg Chest 2 View  07/10/2015  CLINICAL DATA:  Short of breath EXAM: CHEST  2 VIEW COMPARISON:  07/04/2015 FINDINGS: Pleural thickening at the right base is stable. Heterogeneous opacities in the central and basilar right lung are stable. Heart is normal in size. Left lung is relatively clear other than tiny linear opacities likely representing atelectasis or scar. No pneumothorax. IMPRESSION: Stable airspace disease in the right lower lobe with pleural thickening. Electronically Signed   By: Marybelle Killings M.D.   On: 07/10/2015 08:03   Dg Chest 2 View  07/04/2015  CLINICAL DATA:  Shortness of breath EXAM: CHEST  2 VIEW COMPARISON:  07/02/2015 FINDINGS: Continued loculated right pleural effusion noted, stable. Persistent right lower lobe airspace disease, unchanged. No confluent opacity on the left. Heart is borderline in size. No acute bony abnormality. IMPRESSION: Stable loculated right pleural effusion and right lower lobe airspace disease. No change. Electronically Signed   By: Rolm Baptise M.D.   On: 07/04/2015 08:17   Dg Chest 2 View  07/02/2015  CLINICAL DATA:  Shortness of breath wheezing and chest pain. Recent pneumonia EXAM: CHEST  2 VIEW COMPARISON:  06/25/2015 FINDINGS: Loculated pleural fluid/ thickening on the right which is increased from prior. There is persistent lower lobe opacification which may reflect residual of the reported pneumonia. No cardiomegaly for technique. Negative aortic and hilar contours. IMPRESSION: Loculated right pleural effusion which has increased from 06/25/2015. Given recent history of pneumonia, consider CT to evaluate for complicated parapneumonic effusion. Electronically Signed   By: Monte Fantasia M.D.   On: 07/02/2015 12:24   Dg Chest 2 View  06/25/2015  CLINICAL DATA:  Shortness of  breath. Right-sided chest and flank pain. EXAM: CHEST  2 VIEW COMPARISON:  07/20/2014 FINDINGS: Right lower lobe infiltrate and small right pleural effusion are seen which are new since prior exam. Left lung remains clear. Heart size is within normal limits. IMPRESSION: Right lower lobe infiltrate and small right pleural effusion. Recommend clinical correlation for signs or symptoms of pneumonia ; consider followup PA and lateral chest X-ray in 3-4 weeks following trial of antibiotic therapy to ensure resolution/exclude underlying malignancy. Electronically Signed   By: Earle Gell M.D.   On: 06/25/2015 12:13   Ct Chest Wo Contrast  07/07/2015  CLINICAL DATA:  65 year old female with pneumonia. Evaluate for possible empyema. Subsequent encounter. EXAM: CT CHEST WITHOUT CONTRAST TECHNIQUE: Multidetector CT imaging of the chest was performed following the standard protocol without IV contrast. COMPARISON:  07/02/2015 and 08/16/2015 CT.  07/04/2015 chest x-ray. FINDINGS: Consolidation right lower lobe with air bronchograms with increase in degree of cavitations. Right sided loculated pleural effusion/pleural thickening suggestive of an empyema has decreased slightly in size. For instance, posteriorly, maximal thickness of empyema currently 2 cm versus prior 2.6 cm. Substernal extension of multi nodular goiter with coarse calcifications unchanged with displacement surrounding vascular structures. This can be followed by thyroid ultrasound. Top-normal size mediastinal lymph nodes most notable  subcarinal region. Heart size top-normal. Mild calcification thoracic aorta air. Visualized upper abdominal structures without acute abnormality. Degenerative changes thoracic spine without focal compression deformity. No obvious intradural extension of infectious process. No obvious breast mass or axillary adenopathy. IMPRESSION: Consolidation right lower lobe with air bronchograms with increase in degree of cavitations. Right  sided empyema has decreased slightly in size. For instance, posteriorly, maximal thickness of empyema currently 2 cm versus prior 2.6 cm. Electronically Signed   By: Genia Del M.D.   On: 07/07/2015 07:32   Ct Chest W Contrast  07/02/2015  CLINICAL DATA:  Loculated pleural effusion EXAM: CT CHEST WITH CONTRAST TECHNIQUE: Multidetector CT imaging of the chest was performed during intravenous contrast administration. CONTRAST:  43mL ISOVUE-300 IOPAMIDOL (ISOVUE-300) INJECTION 61% COMPARISON:  None. FINDINGS: THORACIC INLET/BODY WALL: Goiter with calcified nodules projecting into the upper mediastinum. No chest wall inflammatory changes. MEDIASTINUM: Normal heart size. No pericardial effusion. No acute vascular abnormality. No adenopathy. LUNG WINDOWS: Dense consolidation in the right lower lobe with areas of central cavitation resulting in scattered gas bubbles. Small loculated pleural effusion around most of the right lower lobe with marked pleural thickening. No pneumothorax. No contralateral pneumonia. No pulmonary edema. UPPER ABDOMEN: Prominent splenic size, accentuated by positioning and stable from 2015 abdominal CT where seen. OSSEOUS: No acute fracture.  No suspicious lytic or blastic lesions. IMPRESSION: Cavitating right lower lobe pneumonia with small but complex pleural effusion that is likely empyema. Electronically Signed   By: Monte Fantasia M.D.   On: 07/02/2015 15:38   [place] Chest  07/09/2015  CLINICAL DATA:  Empyema RIGHT pleural space EXAM: CHEST ULTRASOUND COMPARISON:  CT chest 07/06/2015 FINDINGS: Sonography of the RIGHT hemi thorax demonstrates presence of a small RIGHT pleural fluid collection which is complex, containing diffuse low level internal echogenicity. Volume of RIGHT pleural fluid identified is insufficient for thoracentesis. No other focal sonographic abnormalities identified. IMPRESSION: Complex RIGHT pleural fluid collection containing low level internal echoes, insufficient  in volume for thoracentesis. Electronically Signed   By: Lavonia Dana M.D.   On: 07/09/2015 09:37    Microbiology: Recent Results (from the past 240 hour(s))  MRSA PCR Screening     Status: Abnormal   Collection Time: 07/02/15 10:55 PM  Result Value Ref Range Status   MRSA by PCR POSITIVE (A) NEGATIVE Final    Comment:        The GeneXpert MRSA Assay (FDA approved for NASAL specimens only), is one component of a comprehensive MRSA colonization surveillance program. It is not intended to diagnose MRSA infection nor to guide or monitor treatment for MRSA infections. RESULT CALLED TO, READ BACK BY AND VERIFIED WITH: Ladona Mow @0250  07/03/15 MKELLY   Culture, expectorated sputum-assessment     Status: None   Collection Time: 07/05/15  9:27 AM  Result Value Ref Range Status   Specimen Description SPUTUM  Final   Special Requests NONE  Final   Sputum evaluation THIS SPECIMEN IS ACCEPTABLE FOR SPUTUM CULTURE  Final   Report Status 07/05/2015 FINAL  Final  Culture, respiratory (NON-Expectorated)     Status: Abnormal   Collection Time: 07/05/15 10:01 AM  Result Value Ref Range Status   Specimen Description SPUTUM  Final   Special Requests NONE  Final   Gram Stain   Final    MODERATE WBC PRESENT, PREDOMINANTLY PMN FEW SQUAMOUS EPITHELIAL CELLS PRESENT FEW GRAM POSITIVE COCCI IN PAIRS IN CLUSTERS Performed at [facility]    Culture (A)  Final  METHICILLIN RESISTANT STAPHYLOCOCCUS AUREUS Note: RIFAMPIN AND GENTAMICIN SHOULD NOT BE USED AS SINGLE DRUGS FOR TREATMENT OF STAPH INFECTIONS. This organism DOES NOT demonstrate inducible Clindamycin resistance in vitro. CRITICAL RESULT CALLED TO, READ BACK BY AND VERIFIED WITH: JANE RN 4.18.17  [phone] BY PERCN Performed at [facility]    Report Status 07/08/2015 FINAL  Final   Organism ID, Bacteria METHICILLIN RESISTANT STAPHYLOCOCCUS AUREUS  Final      Susceptibility   Methicillin resistant staphylococcus aureus -  MIC*    CLINDAMYCIN <=0.25 SENSITIVE Sensitive     ERYTHROMYCIN >=8 RESISTANT Resistant     GENTAMICIN <=0.5 SENSITIVE Sensitive     LEVOFLOXACIN 4 INTERMEDIATE Intermediate     OXACILLIN >=4 RESISTANT Resistant     RIFAMPIN <=0.5 SENSITIVE Sensitive     TRIMETH/SULFA <=10 SENSITIVE Sensitive     VANCOMYCIN <=0.5 SENSITIVE Sensitive     TETRACYCLINE <=1 SENSITIVE Sensitive     * METHICILLIN RESISTANT STAPHYLOCOCCUS AUREUS     Labs: Basic Metabolic Panel:  Recent Labs Lab 07/05/15 0411 07/07/15 0627 07/08/15 0626 07/10/15 0829  NA 140 138 137 136  K 4.3 4.6 4.4 4.3  CL 102 100* 96* 96*  CO2 29 26 30 30   GLUCOSE 203* 221* 232* 228*  BUN 16 16 16 18   CREATININE 1.00 1.02* 1.09* 1.00  CALCIUM 8.8* 9.0 9.0 9.4   Liver Function Tests:  Recent Labs Lab 07/05/15 0411 07/08/15 0626 07/10/15 0829  AST 16 15 12*  ALT 19 16 14   ALKPHOS 60 64 62  BILITOT 0.4 0.7 0.3  PROT 6.4* 7.1 7.3  ALBUMIN 2.2* 2.4* 2.5*   No results for input(s): LIPASE, AMYLASE in the last 168 hours. No results for input(s): AMMONIA in the last 168 hours. CBC:  Recent Labs Lab 07/07/15 0627 07/08/15 0626 07/10/15 0829  WBC 4.4 4.0 3.8*  HGB 9.9* 9.7* 9.5*  HCT 31.6* 31.4* 30.6*  MCV 86.8 88.0 87.7  PLT 253 266 258   Cardiac Enzymes: No results for input(s): CKTOTAL, CKMB, CKMBINDEX, TROPONINI in the last 168 hours. BNP: BNP (last 3 results) No results for input(s): BNP in the last 8760 hours.  ProBNP (last 3 results) No results for input(s): PROBNP in the last 8760 hours.  CBG:  Recent Labs Lab 07/09/15 2054 07/10/15 0822 07/10/15 1151 07/10/15 1728 07/11/15 0754  GLUCAP 156* 217* 264* 245* 258*       Signed:  Wynter Isaacs K  [facility] 07/11/2015, 10:49 AM

## 2015-07-19 ENCOUNTER — Encounter (HOSPITAL_COMMUNITY): Payer: Self-pay

## 2015-07-19 ENCOUNTER — Emergency Department (HOSPITAL_COMMUNITY): Payer: 59

## 2015-07-19 ENCOUNTER — Emergency Department (HOSPITAL_COMMUNITY)
Admission: EM | Admit: 2015-07-19 | Discharge: 2015-07-19 | Disposition: A | Discharge status: Home / Self Care | Payer: 59 | Attending: Emergency Medicine | Admitting: Emergency Medicine

## 2015-07-19 DIAGNOSIS — Z87440 Personal history of urinary (tract) infections: Secondary | ICD-10-CM | POA: Insufficient documentation

## 2015-07-19 DIAGNOSIS — Z872 Personal history of diseases of the skin and subcutaneous tissue: Secondary | ICD-10-CM | POA: Insufficient documentation

## 2015-07-19 DIAGNOSIS — G4733 Obstructive sleep apnea (adult) (pediatric): Secondary | ICD-10-CM | POA: Diagnosis not present

## 2015-07-19 DIAGNOSIS — Z7901 Long term (current) use of anticoagulants: Secondary | ICD-10-CM | POA: Diagnosis not present

## 2015-07-19 DIAGNOSIS — Z794 Long term (current) use of insulin: Secondary | ICD-10-CM | POA: Insufficient documentation

## 2015-07-19 DIAGNOSIS — Z79899 Other long term (current) drug therapy: Secondary | ICD-10-CM | POA: Diagnosis not present

## 2015-07-19 DIAGNOSIS — J45909 Unspecified asthma, uncomplicated: Secondary | ICD-10-CM | POA: Insufficient documentation

## 2015-07-19 DIAGNOSIS — E669 Obesity, unspecified: Secondary | ICD-10-CM | POA: Diagnosis not present

## 2015-07-19 DIAGNOSIS — D649 Anemia, unspecified: Secondary | ICD-10-CM | POA: Diagnosis not present

## 2015-07-19 DIAGNOSIS — M1990 Unspecified osteoarthritis, unspecified site: Secondary | ICD-10-CM | POA: Insufficient documentation

## 2015-07-19 DIAGNOSIS — M797 Fibromyalgia: Secondary | ICD-10-CM | POA: Diagnosis not present

## 2015-07-19 DIAGNOSIS — E039 Hypothyroidism, unspecified: Secondary | ICD-10-CM | POA: Insufficient documentation

## 2015-07-19 DIAGNOSIS — I10 Essential (primary) hypertension: Secondary | ICD-10-CM | POA: Diagnosis not present

## 2015-07-19 DIAGNOSIS — G2581 Restless legs syndrome: Secondary | ICD-10-CM | POA: Insufficient documentation

## 2015-07-19 DIAGNOSIS — Z452 Encounter for adjustment and management of vascular access device: Secondary | ICD-10-CM | POA: Diagnosis present

## 2015-07-19 DIAGNOSIS — Z95828 Presence of other vascular implants and grafts: Secondary | ICD-10-CM

## 2015-07-19 DIAGNOSIS — E119 Type 2 diabetes mellitus without complications: Secondary | ICD-10-CM | POA: Insufficient documentation

## 2015-07-19 DIAGNOSIS — R011 Cardiac murmur, unspecified: Secondary | ICD-10-CM | POA: Insufficient documentation

## 2015-07-19 DIAGNOSIS — Z7982 Long term (current) use of aspirin: Secondary | ICD-10-CM | POA: Insufficient documentation

## 2015-07-19 DIAGNOSIS — Z87442 Personal history of urinary calculi: Secondary | ICD-10-CM | POA: Insufficient documentation

## 2015-07-19 DIAGNOSIS — Z8781 Personal history of (healed) traumatic fracture: Secondary | ICD-10-CM | POA: Insufficient documentation

## 2015-07-19 DIAGNOSIS — G43909 Migraine, unspecified, not intractable, without status migrainosus: Secondary | ICD-10-CM | POA: Diagnosis not present

## 2015-07-19 MED ORDER — VANCOMYCIN HCL IN DEXTROSE 1-5 GM/200ML-% IV SOLN
1000.0000 mg | Freq: Once | INTRAVENOUS | Status: AC
  Administered 2015-07-19: 1000 mg via INTRAVENOUS
  Filled 2015-07-19: qty 200

## 2015-07-19 MED ORDER — DOXYCYCLINE HYCLATE 100 MG PO CAPS: 100.0000 mg | ORAL_CAPSULE | Freq: Two times a day (BID) | ORAL | Status: DC

## 2015-07-19 MED ORDER — SODIUM CHLORIDE 0.9 % IV BOLUS (SEPSIS)
500.0000 mL | Freq: Once | INTRAVENOUS | Status: AC
  Administered 2015-07-19: 500 mL via INTRAVENOUS

## 2015-07-19 NOTE — ED Notes (Signed)
IV team at bedside 

## 2015-07-19 NOTE — Progress Notes (Signed)
Peripherally Inserted Central Catheter/Midline Placement  The IV Nurse has discussed with the patient and/or persons authorized to consent for the patient, the purpose of this procedure and the potential benefits and risks involved with this procedure.  The benefits include less needle sticks, lab draws from the catheter and patient may be discharged home with the catheter.  Risks include, but not limited to, infection, bleeding, blood clot (thrombus formation), and puncture of an artery; nerve damage and irregular heat beat.  Alternatives to this procedure were also discussed.  Unable to thread PICC line centrally. Cephailc vein accessed easily, but PICC unsuccessful.  Dr.Raynor notified and recommended to refer to IR if PICC still desired unless able to change to po meds.  Pt was very restless during procedure with hands and arms.  States has restless legs.  Repeatedly crossing legs- hitting the sterile tray with her foot under the drape, but becoming uncovered with so much movement, causing concerns for possible contamination if unable to hold still.     Rolena Infante 07/19/2015, 1:48 PM

## 2015-07-19 NOTE — Discharge Instructions (Signed)

## 2015-07-19 NOTE — ED Provider Notes (Signed)
CSN: [id number]     Arrival date & time 07/19/15  [id number] History   First MD Initiated Contact with Patient 07/19/15 1124     Chief Complaint  Patient presents with  . picc line out      (Consider location/radiation/quality/duration/timing/severity/associated sxs/prior Treatment) HPI [name] is a 65 y.o. female with PMH significant for fibromyalgia, DM, HLD, HTN, Hypothyroidism, obesity who presents to the ED requesting PICC line re-insertion.  Patient was recently discharged 07/11/15 after being treated for PNA complicated by empyema Causing acute respiratory failure and hypoxia.  She treated with IV antibiotics. PICC line was inserted prior discharge though that she could continue IV vancomycin until 5/2. Patient reports that yesterday the adhesive was irritating her skin so she loosened it around the PICC line. She then states that the PICC line became loose and her home health nurse removed it. She states that she had been getting 750 mg IV vanc every 12 hours. She states that they increased it to 1000 mg today until 5/2. She reports continued shortness of breath, this is unchanged. She states she has a follow-up appointment 5/3 with cardiothoracic surgery for repeat chest CT. Denies fever.   Past Medical History  Diagnosis Date  . Urticaria   . Fibromyalgia   . Tachycardia   . DM (diabetes mellitus) ([place])   . Hyperlipidemia   . Bell's palsy   . Hypertension   . Hypothyroidism   . Obesity   . UTI (urinary tract infection)   . Nephrolithiasis   . Anemia   . Carbuncle and furuncle of trunk   . Fracture MAY 2014    HX OF FRACTURED NECK C1- CAUSES SEVERE HEADACHES--LIMITED ROM NECK  . Disorder of fascia     HX OF NECROTIC FASCITIS AFTER ABDOMINAL SURGERY FOR HERNIA- REQUIRED 19 SURGERIES AND 2.5 MONTH HOSPITALIZATION AT [facility]  . Pain     LEFT SHOULDER  PAIN -HARD TO LIE ON LEFT SIDE FOR LONG PERIOD;  PAIN IN LOWER BADK - 3 HERNIATED DISCS AND STENOISIS -  . Elevated cholesterol    . Heart murmur     DOES NOT CAUSE ANY PROBLEMS  . Dysrhythmia     HX OF TACHYCARDIA AND BRADYCARDIA - ON GOING FOR YEARS - DOES NOT HAVE TO SEE CARDIOLOGIST  . Asthma     OCCAS  . Migraine   . Arthritis   . Urinary frequency   . Restless leg syndrome   . OSA (obstructive sleep apnea)     USES CPAP - DOES NOT KNOW SETTING  . Frequent infections     ESPECIALLY PRONE TO INFECTIONS AFTER SURGERIES  . PONV (postoperative nausea and vomiting)     THE GAS MAKES ME NAUSEATED   Past Surgical History  Procedure Laterality Date  . Appendectomy    . Abdominal hysterectomy    . Cholecystectomy    . Cesarean section      X 3  . Carpal tunnel release      BILATERAL  . Knee arthroscopy  RIGHT AND LEFT    X 2  . Shoulder arthroscopy Right     X 2  . Laparoscopic gastric sleeve resection  2014  . Hernia repair      ABDOMINAL HERNIA REPAIR WITH MESH - 3 SURGERIES   . Joint replacement      TOTAL RIGHT KNEE REPLACEMENT  . Right foot drainage of infection    . I&d of infected site in belly - from an injection    .  Tonsillectomy      AND ADENOIDECTOMY  . Hx of 19 surgeries for necrotic fascitis    . Cystoscopy with ureteroscopy Right 12/03/2013    Procedure: CYSTOSCOPY WITH RIGHT RETROGRADE URETEROSCOPY LASER LITHOTRIPSY RIGHT STONE RIGHT URETERAL STENT, ;  Surgeon: Raynelle Bring, MD;  Location: [facility];  Service: Urology;  Laterality: Right;  PROCEDURE WAS ORIGINALLY SCHEDULED AS RIGHT PERCUTANEOUS NEPHROLITHOTOMY  . Nephrolithotomy Right 12/10/2013    Procedure: NEPHROLITHOTOMY PERCUTANEOUS;  Surgeon: Raynelle Bring, MD;  Location: [facility];  Service: Urology;  Laterality: Right;   Family History  Problem Relation Age of Onset  . Diabetes Father   . Osteoarthritis Father   . Heart disease Father   . Ulcers Father   . Stroke Mother    Social History  Substance Use Topics  . Smoking status: Never Smoker   . Smokeless tobacco: Never Used  . Alcohol Use: No   OB History    No data  available     Review of Systems All other systems negative unless otherwise stated in HPI    Allergies  Codeine; Imitrex; Morphine; Nsaids; Promethazine hcl; Statins; Sulfonamide derivatives; and Vicodin  Home Medications   Prior to Admission medications   Medication Sig Start Date End Date Taking? Authorizing Provider  albuterol (PROVENTIL HFA;VENTOLIN HFA) 108 (90 BASE) MCG/ACT inhaler Inhale 2 puffs into the lungs every 6 (six) hours as needed for wheezing or shortness of breath. [place] Provider, MD  aspirin EC 81 MG tablet Take 81 mg by mouth at bedtime.    Historical Provider, MD  b complex vitamins tablet Take 1 tablet by mouth daily.    Historical Provider, MD  benzonatate (TESSALON) 100 MG capsule Take 1 capsule (100 mg total) by mouth every 8 (eight) hours. Patient taking differently: Take 100 mg by mouth 3 (three) times daily as needed for cough.  06/25/15   Pattricia Boss, MD  cetirizine (ZYRTEC) 10 MG tablet Take 10 mg by mouth See admin instructions. Take 1 tablet (10 mg) twice daily during allergy season    Historical Provider, MD  Chlorpheniramine Maleate (CHLOR-TRIMETON PO) Take 1 tablet by mouth See admin instructions. Take 1 tablet four times daily during allergy season    Historical Provider, MD  clonazePAM (KLONOPIN) 0.5 MG tablet Take 0.5 mg by mouth 3 (three) times daily.  07/01/14   Historical Provider, MD  cyclobenzaprine (FLEXERIL) 10 MG tablet Take 10 mg by mouth See admin instructions. Take 1 tablet (10 mg) every night at bedtime, may take an additional tablet during the day as needed for muscle spasms 07/15/14   Historical Provider, MD  dextromethorphan-guaiFENesin (MUCINEX DM) 30-600 MG per 12 hr tablet Take 1 tablet by mouth 2 (two) times daily as needed for cough (congestion).    Historical Provider, MD  diclofenac sodium (VOLTAREN) 1 % GEL Apply 1 application topically 2 (two) times daily as needed (arthritis pain - hands).    Historical Provider, MD   erythromycin ophthalmic ointment Place 1 application into both eyes 2 (two) times daily. Started 07/18/14 for pink eye 07/18/14   Historical Provider, MD  fenofibrate 160 MG tablet Take 160 mg by mouth daily with supper.     Historical Provider, MD  ferrous sulfate 325 (65 FE) MG tablet Take 325 mg by mouth daily with breakfast.    Historical Provider, MD  FLUoxetine (PROZAC) 40 MG capsule Take 40 mg by mouth at bedtime.  07/14/14   Historical Provider, MD  Fluticasone Propionate (  FLONASE ALLERGY RELIEF NA) Place 2 sprays into both nostrils See admin instructions. Use 2 sprays each nare daily at bedtime during allergy season    Historical Provider, MD  gabapentin (NEURONTIN) 100 MG capsule Take 100 mg by mouth 3 (three) times daily.    Historical Provider, MD  hydrochlorothiazide (MICROZIDE) 12.5 MG capsule Take 12.5 mg by mouth daily.  07/01/14   Historical Provider, MD  insulin aspart (NOVOLOG) 100 UNIT/ML injection Inject 5-25 Units into the skin 3 (three) times daily as needed for high blood sugar (CBG >120). Per sliding scale    Historical Provider, MD  insulin detemir (LEVEMIR) 100 UNIT/ML injection Inject 0.1 mLs (10 Units total) into the skin 2 (two) times daily. Patient taking differently: Inject 35 Units into the skin 2 (two) times daily.  07/21/14   Velvet Bathe, MD  levothyroxine (SYNTHROID, LEVOTHROID) 75 MCG tablet Take 37.5 mcg by mouth See admin instructions. Take 4 times a week only. Sunday, Tuesday, Thursday, and Saturday.    Historical Provider, MD  metoprolol (LOPRESSOR) 50 MG tablet Take 50 mg by mouth 3 (three) times daily.  06/28/14   Historical Provider, MD  mometasone (ASMANEX) 220 MCG/INH inhaler Inhale 1 puff into the lungs 2 (two) times daily.    Historical Provider, MD  montelukast (SINGULAIR) 10 MG tablet Take 10 mg by mouth daily.     Historical Provider, MD  Multiple Vitamins-Minerals (HAIR/SKIN/NAILS/BIOTIN) TABS Take 1 tablet by mouth daily.    Historical Provider, MD   omeprazole (PRILOSEC) 20 MG capsule Take 20 mg by mouth daily with supper.     Historical Provider, MD  ondansetron (ZOFRAN-ODT) 4 MG disintegrating tablet Take 4 mg by mouth daily as needed (nausea from pain medication).  06/03/14   Historical Provider, MD  Phentermine-Topiramate 11.25-69 MG CP24 Take 1 tablet by mouth daily.    Historical Provider, MD  potassium citrate (UROCIT-K) 10 MEQ (1080 MG) SR tablet Take 10 mEq by mouth 4 (four) times daily.  06/17/14   Historical Provider, MD  Probiotic Product (PROBIOTIC DAILY PO) Take 1 capsule by mouth daily.     Historical Provider, MD  rOPINIRole (REQUIP) 2 MG tablet Take 2 mg by mouth 4 (four) times daily.     Historical Provider, MD  Vancomycin (VANCOCIN) 750 MG/150ML SOLN Inject 150 mLs (750 mg total) into the vein every 12 (twelve) hours. 07/11/15 07/22/15  Annita Brod, MD  zolpidem (AMBIEN) 5 MG tablet Take 5 mg by mouth at bedtime as needed for sleep.  06/12/14   Historical Provider, MD   BP 131/44 mmHg  Pulse 69  Temp(Src) 99.1 F (37.3 C) (Oral)  Resp 18  SpO2 98% Physical Exam  Constitutional: She is oriented to person, place, and time. She appears well-developed and well-nourished.  Non-toxic appearance. She does not have a sickly appearance. She does not appear ill.  HENT:  Head: Normocephalic and atraumatic.  Mouth/Throat: Oropharynx is clear and moist.  Eyes: Conjunctivae are normal.  Neck: Normal range of motion. Neck supple.  Cardiovascular: Normal rate, regular rhythm and normal heart sounds.   No murmur heard. Pulses:      Radial pulses are 2+ on the right side, and 2+ on the left side.  Pulmonary/Chest: Effort normal. No accessory muscle usage or stridor. No respiratory distress. She has decreased breath sounds (throughout, 2/2 to body habitus). She has no wheezes. She has no rhonchi. She has no rales.  Abdominal: Soft. Bowel sounds are normal. She exhibits no distension. There  is no tenderness.  Musculoskeletal: Normal  range of motion.  Lymphadenopathy:    She has no cervical adenopathy.  Neurological: She is alert and oriented to person, place, and time.  Speech clear without dysarthria.  Skin: Skin is warm and dry.  Skin irritation to medial aspect of right upper extremity. No signs of infection.  Psychiatric: She has a normal mood and affect. Her behavior is normal.    ED Course  Procedures (including critical care time) Labs Review Labs Reviewed - No data to display  Imaging Review No results found. I have personally reviewed and evaluated these images and lab results as part of my medical decision-making.   EKG Interpretation None      MDM   Final diagnoses:  Status post PICC central line placement   Patient presents for PICC line re-insertion.  She removed surrounding adhesive, it became lose, HHN removed it yesterday.  No acute changes in symptoms.  Remains slightly short of breath.  No fevers.  VSS, NAD.  Heart RRR, lungs with decreased breath sound 2/2 to body habitus, abdomen soft and benign.  Patient received IV vanc while waiting for PICC line placement.  PICC line attempted by IV team; however, unsuccessful due to patient movement/non-compliance.  CXR performed and shows no change.  Spoke with Dr. Baxter Flattery, infectious disease, who recommends PO Doxycycline 100 mg BID x 7 days, no indication to consult IR for PICC placement. Discussed return precautions.  Follow up with PCP and CT surgery for repeat chest CT 5/2.  Patient agrees and acknowledges the above plan for discharge.   Case has been discussed with and seen by Dr. Wyvonnia Dusky who agrees with the above plan for discharge.     Gloriann Loan, PA-C 07/19/15 [place], MD 07/19/15 [phone]

## 2015-07-19 NOTE — ED Notes (Signed)
Patient here requested PICC line be replaced, out since yesterday from right arm

## 2015-07-22 ENCOUNTER — Other Ambulatory Visit: Payer: Self-pay | Admitting: Cardiothoracic Surgery

## 2015-07-22 DIAGNOSIS — R918 Other nonspecific abnormal finding of lung field: Secondary | ICD-10-CM

## 2015-07-22 DIAGNOSIS — J869 Pyothorax without fistula: Secondary | ICD-10-CM

## 2015-07-23 ENCOUNTER — Ambulatory Visit
Admit: 2015-07-23 | Discharge: 2015-07-23 | Disposition: A | Discharge status: Home / Self Care | Payer: 59 | Attending: Cardiothoracic Surgery | Admitting: Cardiothoracic Surgery

## 2015-07-23 ENCOUNTER — Ambulatory Visit (INDEPENDENT_AMBULATORY_CARE_PROVIDER_SITE_OTHER): Payer: 59 | Admitting: Cardiothoracic Surgery

## 2015-07-23 ENCOUNTER — Encounter: Payer: Self-pay | Admitting: Cardiothoracic Surgery

## 2015-07-23 VITALS — BP 132/80 | HR 80 | Resp 20 | Ht 61.0 in | Wt 230.0 lb

## 2015-07-23 DIAGNOSIS — R918 Other nonspecific abnormal finding of lung field: Secondary | ICD-10-CM

## 2015-07-23 DIAGNOSIS — J869 Pyothorax without fistula: Secondary | ICD-10-CM

## 2015-07-23 MED ORDER — IOPAMIDOL (ISOVUE-300) INJECTION 61%
75.0000 mL | Freq: Once | INTRAVENOUS | Status: AC | PRN
  Administered 2015-07-23: 75 mL via INTRAVENOUS

## 2015-07-23 NOTE — Progress Notes (Signed)
PCP is Jerlyn Ly, MD Referring Provider is Crist Infante, MD  Chief Complaint  Patient presents with  . Empyema    f/u from hospital consult with CT Chest    HPI: Post hospitalization check. The patient is morbid obese with cervical: Morbid medical problems who was hospitalized on the medical service for right lower lobe pneumonia with a parapneumonic effusion. CT scan of the chest had some concern for an empyema . However her clinical status improved dramatically on antibiotics and followup radiographic studies showed improvement of the loculated effusion and VATS was not deemed to be necessary. The patient was discharged home on IV vancomycin via PICC line. The patient presented to the ED last week and 4 days ago after the PICC line became dislodged. She was converted from vancomycin IV to oral doxycycline 100 mg twice a day by infectious disease. She returns in office today with CT scan of the chest for further review and monitoring of her progress.  Patient states she still feels weak. The patient denies fever productive cough or pleuritic chest pain. She has been taking the doxycycline as prescribed.  CT scan of the chest performed today with contrast is personally reviewed and shows dramatic improvement in the loculated pleural effusion. The right lower lobe infiltrate is also improved with parenchymal clearing,still some  bronchial thickening remains. There is also some pleural thickening but overall significant improvement. Past Medical History  Diagnosis Date  . Urticaria   . Fibromyalgia   . Tachycardia   . DM (diabetes mellitus) ([place])   . Hyperlipidemia   . Bell's palsy   . Hypertension   . Hypothyroidism   . Obesity   . UTI (urinary tract infection)   . Nephrolithiasis   . Anemia   . Carbuncle and furuncle of trunk   . Fracture MAY 2014    HX OF FRACTURED NECK C1- CAUSES SEVERE HEADACHES--LIMITED ROM NECK  . Disorder of fascia     HX OF NECROTIC FASCITIS AFTER  ABDOMINAL SURGERY FOR HERNIA- REQUIRED 19 SURGERIES AND 2.5 MONTH HOSPITALIZATION AT [facility]  . Pain     LEFT SHOULDER  PAIN -HARD TO LIE ON LEFT SIDE FOR LONG PERIOD;  PAIN IN LOWER BADK - 3 HERNIATED DISCS AND STENOISIS -  . Elevated cholesterol   . Heart murmur     DOES NOT CAUSE ANY PROBLEMS  . Dysrhythmia     HX OF TACHYCARDIA AND BRADYCARDIA - ON GOING FOR YEARS - DOES NOT HAVE TO SEE CARDIOLOGIST  . Asthma     OCCAS  . Migraine   . Arthritis   . Urinary frequency   . Restless leg syndrome   . OSA (obstructive sleep apnea)     USES CPAP - DOES NOT KNOW SETTING  . Frequent infections     ESPECIALLY PRONE TO INFECTIONS AFTER SURGERIES  . PONV (postoperative nausea and vomiting)     THE GAS MAKES ME NAUSEATED    Past Surgical History  Procedure Laterality Date  . Appendectomy    . Abdominal hysterectomy    . Cholecystectomy    . Cesarean section      X 3  . Carpal tunnel release      BILATERAL  . Knee arthroscopy  RIGHT AND LEFT    X 2  . Shoulder arthroscopy Right     X 2  . Laparoscopic gastric sleeve resection  2014  . Hernia repair      ABDOMINAL HERNIA REPAIR WITH MESH - 3 SURGERIES   .  Joint replacement      TOTAL RIGHT KNEE REPLACEMENT  . Right foot drainage of infection    . I&d of infected site in belly - from an injection    . Tonsillectomy      AND ADENOIDECTOMY  . Hx of 19 surgeries for necrotic fascitis    . Cystoscopy with ureteroscopy Right 12/03/2013    Procedure: CYSTOSCOPY WITH RIGHT RETROGRADE URETEROSCOPY LASER LITHOTRIPSY RIGHT STONE RIGHT URETERAL STENT, ;  Surgeon: Raynelle Bring, MD;  Location: [facility];  Service: Urology;  Laterality: Right;  PROCEDURE WAS ORIGINALLY SCHEDULED AS RIGHT PERCUTANEOUS NEPHROLITHOTOMY  . Nephrolithotomy Right 12/10/2013    Procedure: NEPHROLITHOTOMY PERCUTANEOUS;  Surgeon: Raynelle Bring, MD;  Location: [facility];  Service: Urology;  Laterality: Right;    Family History  Problem Relation Age of Onset  . Diabetes  Father   . Osteoarthritis Father   . Heart disease Father   . Ulcers Father   . Stroke Mother     Social History Social History  Substance Use Topics  . Smoking status: Never Smoker   . Smokeless tobacco: Never Used  . Alcohol Use: No    Current Outpatient Prescriptions  Medication Sig Dispense Refill  . albuterol (PROVENTIL HFA;VENTOLIN HFA) 108 (90 BASE) MCG/ACT inhaler Inhale 2 puffs into the lungs every 6 (six) hours as needed for wheezing or shortness of breath. PROAIR    . aspirin EC 81 MG tablet Take 81 mg by mouth at bedtime.    Marland Kitchen b complex vitamins tablet Take 1 tablet by mouth daily.    . benzonatate (TESSALON) 100 MG capsule Take 1 capsule (100 mg total) by mouth every 8 (eight) hours. (Patient taking differently: Take 100 mg by mouth 3 (three) times daily as needed for cough. ) 21 capsule 0  . cetirizine (ZYRTEC) 10 MG tablet Take 10 mg by mouth See admin instructions. Take 1 tablet (10 mg) twice daily during allergy season    . Chlorpheniramine Maleate (CHLOR-TRIMETON PO) Take 1 tablet by mouth See admin instructions. Take 1 tablet four times daily during allergy season    . clonazePAM (KLONOPIN) 0.5 MG tablet Take 0.5 mg by mouth 3 (three) times daily.   0  . cyclobenzaprine (FLEXERIL) 10 MG tablet Take 10 mg by mouth See admin instructions. Take 1 tablet (10 mg) every night at bedtime, may take an additional tablet during the day as needed for muscle spasms  0  . dextromethorphan-guaiFENesin (MUCINEX DM) 30-600 MG per 12 hr tablet Take 1 tablet by mouth 2 (two) times daily as needed for cough (congestion).    Marland Kitchen diclofenac sodium (VOLTAREN) 1 % GEL Apply 1 application topically 2 (two) times daily as needed (arthritis pain - hands).    . doxycycline (VIBRAMYCIN) 100 MG capsule Take 1 capsule (100 mg total) by mouth 2 (two) times daily. 14 capsule 0  . erythromycin ophthalmic ointment Place 1 application into both eyes 2 (two) times daily. Started 07/18/14 for pink eye  0  .  fenofibrate 160 MG tablet Take 160 mg by mouth daily with supper.     . ferrous sulfate 325 (65 FE) MG tablet Take 325 mg by mouth daily with breakfast.    . FLUoxetine (PROZAC) 40 MG capsule Take 40 mg by mouth at bedtime.   1  . Fluticasone Propionate (FLONASE ALLERGY RELIEF NA) Place 2 sprays into both nostrils See admin instructions. Use 2 sprays each nare daily at bedtime during allergy season    . gabapentin (  NEURONTIN) 100 MG capsule Take 100 mg by mouth 3 (three) times daily.    . hydrochlorothiazide (MICROZIDE) 12.5 MG capsule Take 12.5 mg by mouth daily.   1  . insulin aspart (NOVOLOG) 100 UNIT/ML injection Inject 5-25 Units into the skin 3 (three) times daily as needed for high blood sugar (CBG >120). Per sliding scale    . insulin detemir (LEVEMIR) 100 UNIT/ML injection Inject 0.1 mLs (10 Units total) into the skin 2 (two) times daily. (Patient taking differently: Inject 35 Units into the skin 2 (two) times daily. ) 10 mL 11  . levothyroxine (SYNTHROID, LEVOTHROID) 75 MCG tablet Take 37.5 mcg by mouth See admin instructions. Take 4 times a week only. Sunday, Tuesday, Thursday, and Saturday.    . metoprolol (LOPRESSOR) 50 MG tablet Take 50 mg by mouth 3 (three) times daily.   0  . mometasone (ASMANEX) 220 MCG/INH inhaler Inhale 1 puff into the lungs 2 (two) times daily.    . montelukast (SINGULAIR) 10 MG tablet Take 10 mg by mouth daily.     . Multiple Vitamins-Minerals (HAIR/SKIN/NAILS/BIOTIN) TABS Take 1 tablet by mouth daily.    Marland Kitchen omeprazole (PRILOSEC) 20 MG capsule Take 20 mg by mouth daily with supper.     . ondansetron (ZOFRAN-ODT) 4 MG disintegrating tablet Take 4 mg by mouth daily as needed (nausea from pain medication).   0  . Phentermine-Topiramate 11.25-69 MG CP24 Take 1 tablet by mouth daily.    . potassium citrate (UROCIT-K) 10 MEQ (1080 MG) SR tablet Take 10 mEq by mouth 4 (four) times daily.   1  . Probiotic Product (PROBIOTIC DAILY PO) Take 1 capsule by mouth daily.     Marland Kitchen  rOPINIRole (REQUIP) 2 MG tablet Take 2 mg by mouth 4 (four) times daily.     Marland Kitchen zolpidem (AMBIEN) 5 MG tablet Take 5 mg by mouth at bedtime as needed for sleep.   0   No current facility-administered medications for this visit.    Allergies  Allergen Reactions  . Codeine Nausea Only  . Imitrex [Sumatriptan] Other (See Comments)    Facial spasms  . Morphine Other (See Comments)    Severe headaches   . Nsaids Other (See Comments)    PT UNABLE TO TOLERATE NSAID'S DRUGS DUE TO HX OF GASTRIC SLEEVE SURGERY  . Promethazine Hcl Other (See Comments)    Restless leg feeling all over body  . Statins Other (See Comments)    Leg/muscle pain  . Sulfonamide Derivatives Hives  . Vicodin [Hydrocodone-Acetaminophen] Nausea And Vomiting    Review of Systems   Feels weak Poor appetite No falls or dizzy spells No shortness of breath at rest Complains of low back pain-chronic from arthritis No palpitations or symptoms of CHF Some constipation but no significant abdominal pain  BP 132/80 mmHg  Pulse 80  Resp 20  Ht 5\' 1"  (1.549 m)  Wt 230 lb (104.327 kg)  BMI 43.48 kg/m2  SpO2 98% Physical Exam      Physical Exam  General: Short morbidly obese Caucasian female anxious but in no acute distress HEENT: Normocephalic pupils equal , dentition adequate Neck: Supple without JVD, adenopathy, or bruit Chest: Clear to auscultation, symmetrical breath sounds, no rhonchi, no tenderness             or deformity Cardiovascular: Regular rate and rhythm, no murmur, no gallop, peripheral pulses             palpable in all extremities Abdomen:  Soft, nontender, no palpable mass or organomegaly Extremities: Warm, well-perfused, no clubbing cyanosis edema or tenderness,              no venous stasis changes of the legs Rectal/GU: Deferred Neuro: Grossly non--focal and symmetrical throughout Skin: Clean and dry without rash or ulceration   Diagnostic Tests: CT scan personally reviewed The shows  significant improvement in loculated right pleural effusion and right lower lobe infiltrate  Impression: Continued response to antibiotic therapy. No indication for VATS. I recommended the patient taken additional course of oral doxycycline after she finishes the 100 mg twice a day course started by infectious disease.  Plan:patient will continue doxycycline 100 mg per day for 2 weeks after she finishes her current course. She is to return for chest x-ray and reexamine and monitoring of progress in 3 weeks.   Len Childs, MD [facility] [phone]

## 2015-08-13 ENCOUNTER — Encounter: Payer: 59 | Admitting: Cardiothoracic Surgery

## 2015-08-26 ENCOUNTER — Other Ambulatory Visit: Payer: Self-pay | Admitting: Cardiothoracic Surgery

## 2015-08-26 DIAGNOSIS — J869 Pyothorax without fistula: Secondary | ICD-10-CM

## 2015-08-27 ENCOUNTER — Encounter: Payer: Self-pay | Admitting: Cardiothoracic Surgery

## 2015-08-27 ENCOUNTER — Ambulatory Visit
Admission: RE | Admit: 2015-08-27 | Discharge: 2015-08-27 | Disposition: A | Discharge status: Home / Self Care | Payer: 59 | Source: Ambulatory Visit | Attending: Cardiothoracic Surgery | Admitting: Cardiothoracic Surgery

## 2015-08-27 ENCOUNTER — Ambulatory Visit (INDEPENDENT_AMBULATORY_CARE_PROVIDER_SITE_OTHER): Payer: 59 | Admitting: Cardiothoracic Surgery

## 2015-08-27 VITALS — BP 145/72 | HR 77 | Temp 97.1°F | Resp 18 | Ht 61.0 in | Wt 230.0 lb

## 2015-08-27 DIAGNOSIS — J869 Pyothorax without fistula: Secondary | ICD-10-CM | POA: Diagnosis not present

## 2015-08-27 NOTE — Progress Notes (Signed)
PCP is Jerlyn Ly, MD Referring Provider is Crist Infante, MD  Chief Complaint  Patient presents with  . Empyema    1 month f/u with CXR    HPI:the patient returns for a scheduled followup after being hospitalized earlier this spring with right lower lobe pneumonia and loculated effusion which was treated with antibiotics. VATS was considered but not performed due to the small size of the effusion. Clinically she has recovered without symptoms of pleuritic chest pain productive cough malaise fever. Chest x-ray today shows clear right costophrenic angle, no evidence of pleural effusion. Interval CT scan was obtained at her last visit which showed significant improvement but not quite resolution of the process in the right pleural space. She is a nonsmoker.   Past Medical History  Diagnosis Date  . Urticaria   . Fibromyalgia   . Tachycardia   . DM (diabetes mellitus) ([place])   . Hyperlipidemia   . Bell's palsy   . Hypertension   . Hypothyroidism   . Obesity   . UTI (urinary tract infection)   . Nephrolithiasis   . Anemia   . Carbuncle and furuncle of trunk   . Fracture MAY 2014    HX OF FRACTURED NECK C1- CAUSES SEVERE HEADACHES--LIMITED ROM NECK  . Disorder of fascia     HX OF NECROTIC FASCITIS AFTER ABDOMINAL SURGERY FOR HERNIA- REQUIRED 19 SURGERIES AND 2.5 MONTH HOSPITALIZATION AT [facility]  . Pain     LEFT SHOULDER  PAIN -HARD TO LIE ON LEFT SIDE FOR LONG PERIOD;  PAIN IN LOWER BADK - 3 HERNIATED DISCS AND STENOISIS -  . Elevated cholesterol   . Heart murmur     DOES NOT CAUSE ANY PROBLEMS  . Dysrhythmia     HX OF TACHYCARDIA AND BRADYCARDIA - ON GOING FOR YEARS - DOES NOT HAVE TO SEE CARDIOLOGIST  . Asthma     OCCAS  . Migraine   . Arthritis   . Urinary frequency   . Restless leg syndrome   . OSA (obstructive sleep apnea)     USES CPAP - DOES NOT KNOW SETTING  . Frequent infections     ESPECIALLY PRONE TO INFECTIONS AFTER SURGERIES  . PONV (postoperative nausea and  vomiting)     THE GAS MAKES ME NAUSEATED    Past Surgical History  Procedure Laterality Date  . Appendectomy    . Abdominal hysterectomy    . Cholecystectomy    . Cesarean section      X 3  . Carpal tunnel release      BILATERAL  . Knee arthroscopy  RIGHT AND LEFT    X 2  . Shoulder arthroscopy Right     X 2  . Laparoscopic gastric sleeve resection  2014  . Hernia repair      ABDOMINAL HERNIA REPAIR WITH MESH - 3 SURGERIES   . Joint replacement      TOTAL RIGHT KNEE REPLACEMENT  . Right foot drainage of infection    . I&d of infected site in belly - from an injection    . Tonsillectomy      AND ADENOIDECTOMY  . Hx of 19 surgeries for necrotic fascitis    . Cystoscopy with ureteroscopy Right 12/03/2013    Procedure: CYSTOSCOPY WITH RIGHT RETROGRADE URETEROSCOPY LASER LITHOTRIPSY RIGHT STONE RIGHT URETERAL STENT, ;  Surgeon: Raynelle Bring, MD;  Location: [facility];  Service: Urology;  Laterality: Right;  PROCEDURE WAS ORIGINALLY SCHEDULED AS RIGHT PERCUTANEOUS NEPHROLITHOTOMY  . Nephrolithotomy Right  12/10/2013    Procedure: NEPHROLITHOTOMY PERCUTANEOUS;  Surgeon: Raynelle Bring, MD;  Location: [facility];  Service: Urology;  Laterality: Right;    Family History  Problem Relation Age of Onset  . Diabetes Father   . Osteoarthritis Father   . Heart disease Father   . Ulcers Father   . Stroke Mother     Social History Social History  Substance Use Topics  . Smoking status: Never Smoker   . Smokeless tobacco: Never Used  . Alcohol Use: No    Current Outpatient Prescriptions  Medication Sig Dispense Refill  . albuterol (PROVENTIL HFA;VENTOLIN HFA) 108 (90 BASE) MCG/ACT inhaler Inhale 2 puffs into the lungs every 6 (six) hours as needed for wheezing or shortness of breath. PROAIR    . aspirin EC 81 MG tablet Take 81 mg by mouth at bedtime.    Marland Kitchen b complex vitamins tablet Take 1 tablet by mouth daily.    . benzonatate (TESSALON) 100 MG capsule Take 1 capsule (100 mg total) by mouth  every 8 (eight) hours. (Patient taking differently: Take 100 mg by mouth 3 (three) times daily as needed for cough. ) 21 capsule 0  . cetirizine (ZYRTEC) 10 MG tablet Take 10 mg by mouth See admin instructions. Take 1 tablet (10 mg) twice daily during allergy season    . Chlorpheniramine Maleate (CHLOR-TRIMETON PO) Take 1 tablet by mouth See admin instructions. Take 1 tablet four times daily during allergy season    . clonazePAM (KLONOPIN) 0.5 MG tablet Take 0.5 mg by mouth 3 (three) times daily.   0  . cyclobenzaprine (FLEXERIL) 10 MG tablet Take 10 mg by mouth See admin instructions. Take 1 tablet (10 mg) every night at bedtime, may take an additional tablet during the day as needed for muscle spasms  0  . dextromethorphan-guaiFENesin (MUCINEX DM) 30-600 MG per 12 hr tablet Take 1 tablet by mouth 2 (two) times daily as needed for cough (congestion).    Marland Kitchen diclofenac sodium (VOLTAREN) 1 % GEL Apply 1 application topically 2 (two) times daily as needed (arthritis pain - hands).    . doxycycline (VIBRAMYCIN) 100 MG capsule Take 1 capsule (100 mg total) by mouth 2 (two) times daily. 14 capsule 0  . erythromycin ophthalmic ointment Place 1 application into both eyes 2 (two) times daily. Started 07/18/14 for pink eye  0  . fenofibrate 160 MG tablet Take 160 mg by mouth daily with supper.     . ferrous sulfate 325 (65 FE) MG tablet Take 325 mg by mouth daily with breakfast.    . FLUoxetine (PROZAC) 40 MG capsule Take 40 mg by mouth at bedtime.   1  . Fluticasone Propionate (FLONASE ALLERGY RELIEF NA) Place 2 sprays into both nostrils See admin instructions. Use 2 sprays each nare daily at bedtime during allergy season    . gabapentin (NEURONTIN) 100 MG capsule Take 100 mg by mouth 3 (three) times daily.    . hydrochlorothiazide (MICROZIDE) 12.5 MG capsule Take 12.5 mg by mouth daily.   1  . insulin aspart (NOVOLOG) 100 UNIT/ML injection Inject 5-25 Units into the skin 3 (three) times daily as needed for high  blood sugar (CBG >120). Per sliding scale    . insulin detemir (LEVEMIR) 100 UNIT/ML injection Inject 0.1 mLs (10 Units total) into the skin 2 (two) times daily. (Patient taking differently: Inject 35 Units into the skin 2 (two) times daily. ) 10 mL 11  . levothyroxine (SYNTHROID, LEVOTHROID) 75 MCG  tablet Take 37.5 mcg by mouth See admin instructions. Take 4 times a week only. Sunday, Tuesday, Thursday, and Saturday.    . metoprolol (LOPRESSOR) 50 MG tablet Take 50 mg by mouth 3 (three) times daily.   0  . mometasone (ASMANEX) 220 MCG/INH inhaler Inhale 1 puff into the lungs 2 (two) times daily.    . montelukast (SINGULAIR) 10 MG tablet Take 10 mg by mouth daily.     . Multiple Vitamins-Minerals (HAIR/SKIN/NAILS/BIOTIN) TABS Take 1 tablet by mouth daily.    Marland Kitchen omeprazole (PRILOSEC) 20 MG capsule Take 20 mg by mouth daily with supper.     . ondansetron (ZOFRAN-ODT) 4 MG disintegrating tablet Take 4 mg by mouth daily as needed (nausea from pain medication).   0  . Phentermine-Topiramate 11.25-69 MG CP24 Take 1 tablet by mouth daily.    . Probiotic Product (PROBIOTIC DAILY PO) Take 1 capsule by mouth daily.     Marland Kitchen rOPINIRole (REQUIP) 2 MG tablet Take 2 mg by mouth 4 (four) times daily.     Marland Kitchen zolpidem (AMBIEN) 5 MG tablet Take 5 mg by mouth at bedtime as needed for sleep.   0  . potassium citrate (UROCIT-K) 10 MEQ (1080 MG) SR tablet Take 10 mEq by mouth 4 (four) times daily.   1   No current facility-administered medications for this visit.    Allergies  Allergen Reactions  . Codeine Nausea Only  . Imitrex [Sumatriptan] Other (See Comments)    Facial spasms  . Morphine Other (See Comments)    Severe headaches   . Nsaids Other (See Comments)    PT UNABLE TO TOLERATE NSAID'S DRUGS DUE TO HX OF GASTRIC SLEEVE SURGERY  . Promethazine Hcl Other (See Comments)    Restless leg feeling all over body  . Statins Other (See Comments)    Leg/muscle pain  . Sulfonamide Derivatives Hives  . Vicodin  [Hydrocodone-Acetaminophen] Nausea And Vomiting    Review of Systems  No fever No chest pain No productive cough Patient understands and agrees to annual influenza vaccination in the fall  BP 145/72 mmHg  Pulse 77  Temp(Src) 97.1 F (36.2 C) (Oral)  Resp 18  Ht 5\' 1"  (1.549 m)  Wt 230 lb (104.327 kg)  BMI 43.48 kg/m2  SpO2 96% Physical Exam Alert and comfortable Breath sounds clear Heart rhythm regular No tenderness over right chest  Diagnostic Tests: Chest x-ray done today personally reviewed showing no residual pleural effusion  Impression: Resolved right lower lobe pneumonia and loculated effusion  Plan:return as needed. Annual influenza vaccine in the fall   Len Childs, MD [facility] [phone]

## 2015-11-19 DIAGNOSIS — R2231 Localized swelling, mass and lump, right upper limb: Secondary | ICD-10-CM | POA: Insufficient documentation

## 2015-11-19 DIAGNOSIS — M65331 Trigger finger, right middle finger: Secondary | ICD-10-CM | POA: Insufficient documentation

## 2015-11-20 ENCOUNTER — Other Ambulatory Visit: Payer: Self-pay | Admitting: Orthopedic Surgery

## 2015-12-11 ENCOUNTER — Encounter (HOSPITAL_BASED_OUTPATIENT_CLINIC_OR_DEPARTMENT_OTHER): Payer: Self-pay | Admitting: Anesthesiology

## 2015-12-18 ENCOUNTER — Ambulatory Visit (HOSPITAL_BASED_OUTPATIENT_CLINIC_OR_DEPARTMENT_OTHER): Admit: 2015-12-18 | Payer: 59 | Admitting: Orthopedic Surgery

## 2015-12-18 ENCOUNTER — Encounter (HOSPITAL_BASED_OUTPATIENT_CLINIC_OR_DEPARTMENT_OTHER): Payer: Self-pay

## 2015-12-18 SURGERY — CARPOMETACARPEL (CMC) SUSPENSION PLASTY
Anesthesia: Choice | Laterality: Right

## 2015-12-23 ENCOUNTER — Other Ambulatory Visit: Payer: Self-pay | Admitting: Neurosurgery

## 2015-12-23 DIAGNOSIS — M4316 Spondylolisthesis, lumbar region: Secondary | ICD-10-CM

## 2015-12-29 ENCOUNTER — Ambulatory Visit
Admission: RE | Admit: 2015-12-29 | Discharge: 2015-12-29 | Disposition: A | Discharge status: Home / Self Care | Payer: 59 | Source: Ambulatory Visit | Attending: Neurosurgery | Admitting: Neurosurgery

## 2015-12-29 DIAGNOSIS — M4316 Spondylolisthesis, lumbar region: Secondary | ICD-10-CM

## 2016-01-05 ENCOUNTER — Ambulatory Visit (HOSPITAL_COMMUNITY)
Admission: EM | Admit: 2016-01-05 | Discharge: 2016-01-05 | Disposition: A | Discharge status: Home / Self Care | Payer: 59 | Attending: Emergency Medicine | Admitting: Emergency Medicine

## 2016-01-05 ENCOUNTER — Encounter (HOSPITAL_COMMUNITY): Payer: Self-pay | Admitting: *Deleted

## 2016-01-05 DIAGNOSIS — L304 Erythema intertrigo: Secondary | ICD-10-CM | POA: Diagnosis not present

## 2016-01-05 MED ORDER — MUPIROCIN 2 % EX OINT: 1.0000 "application " | TOPICAL_OINTMENT | Freq: Three times a day (TID) | CUTANEOUS | 0 refills | Status: DC

## 2016-01-05 MED ORDER — NYSTATIN 100000 UNIT/GM EX CREA: TOPICAL_CREAM | CUTANEOUS | 0 refills | Status: DC

## 2016-01-05 MED ORDER — FLUCONAZOLE 150 MG PO TABS: 150.0000 mg | ORAL_TABLET | Freq: Once | ORAL | 1 refills | Status: AC

## 2016-01-05 NOTE — ED Triage Notes (Signed)
Pt  Has    Drainage    From         Area   Around  Her           abd  She  Reports        She  Has  Had  A  Staph   Infection  In  The  Past         She   Ambulated  To room  With a  Steady  Fluid   Gait

## 2016-01-05 NOTE — ED Provider Notes (Signed)
HPI  SUBJECTIVE:  [name] is a 65 y.o. female who presents with achy constant, erythematous painful area  located in the right lower pannus that she noticed today. States that it "smells bad". States that it is "wet". Symptoms are worse with palpation exposure to air, no alleviating factors. Does not recall trauma to the area. This is in an area status post skin graft. No fevers, no antipyretic in the past 6-8 hours. Bodies, generalized weakness. She states that she was recently treated for a staph infection 1 week ago in her ear. States that she was treated with doxycycline, and her ear has improved. She has a past medical history including MRSA, abdominal wall abscess that was drained in the office. Also history of diabetes, states her sugars been running within normal limits. No history of necrotizing fasciitis, hypertension. PMD: Dr. Crist Infante.     Past Medical History:  Diagnosis Date  . Anemia   . Arthritis   . Asthma    OCCAS  . Bell's palsy   . Carbuncle and furuncle of trunk   . Disorder of fascia    HX OF NECROTIC FASCITIS AFTER ABDOMINAL SURGERY FOR HERNIA- REQUIRED 19 SURGERIES AND 2.5 MONTH HOSPITALIZATION AT [facility]  . DM (diabetes mellitus) ([place])   . Dysrhythmia    HX OF TACHYCARDIA AND BRADYCARDIA - ON GOING FOR YEARS - DOES NOT HAVE TO SEE CARDIOLOGIST  . Elevated cholesterol   . Fibromyalgia   . Fracture MAY 2014   HX OF FRACTURED NECK C1- CAUSES SEVERE HEADACHES--LIMITED ROM NECK  . Frequent infections    ESPECIALLY PRONE TO INFECTIONS AFTER SURGERIES  . Heart murmur    DOES NOT CAUSE ANY PROBLEMS  . Hyperlipidemia   . Hypertension   . Hypothyroidism   . Migraine   . Nephrolithiasis   . Obesity   . OSA (obstructive sleep apnea)    USES CPAP - DOES NOT KNOW SETTING  . Pain    LEFT SHOULDER  PAIN -HARD TO LIE ON LEFT SIDE FOR LONG PERIOD;  PAIN IN LOWER BADK - 3 HERNIATED DISCS AND STENOISIS -  . PONV (postoperative nausea and vomiting)    THE GAS  MAKES ME NAUSEATED  . Restless leg syndrome   . Tachycardia   . Urinary frequency   . Urticaria   . UTI (urinary tract infection)     Past Surgical History:  Procedure Laterality Date  . ABDOMINAL HYSTERECTOMY    . APPENDECTOMY    . CARPAL TUNNEL RELEASE     BILATERAL  . CESAREAN SECTION     X 3  . CHOLECYSTECTOMY    . CYSTOSCOPY WITH URETEROSCOPY Right 12/03/2013   Procedure: CYSTOSCOPY WITH RIGHT RETROGRADE URETEROSCOPY LASER LITHOTRIPSY RIGHT STONE RIGHT URETERAL STENT, ;  Surgeon: Raynelle Bring, MD;  Location: [facility];  Service: Urology;  Laterality: Right;  PROCEDURE WAS ORIGINALLY SCHEDULED AS RIGHT PERCUTANEOUS NEPHROLITHOTOMY  . HERNIA REPAIR     ABDOMINAL HERNIA REPAIR WITH MESH - 3 SURGERIES   . HX OF 19 SURGERIES FOR NECROTIC FASCITIS    . I&D OF INFECTED SITE IN [place] - FROM AN INJECTION    . JOINT REPLACEMENT     TOTAL RIGHT KNEE REPLACEMENT  . KNEE ARTHROSCOPY  RIGHT AND LEFT   X 2  . [place] RESECTION  2014  . NEPHROLITHOTOMY Right 12/10/2013   Procedure: NEPHROLITHOTOMY PERCUTANEOUS;  Surgeon: Raynelle Bring, MD;  Location: [facility];  Service: Urology;  Laterality: Right;  .  RIGHT FOOT DRAINAGE OF INFECTION    . shoulder arthroscopy Right    X 2  . TONSILLECTOMY     AND ADENOIDECTOMY    Family History  Problem Relation Age of Onset  . Diabetes Father   . Osteoarthritis Father   . Heart disease Father   . Ulcers Father   . Stroke Mother     Social History  Substance Use Topics  . Smoking status: Never Smoker  . Smokeless tobacco: Never Used  . Alcohol use No    No current facility-administered medications for this encounter.   Current Outpatient Prescriptions:  .  albuterol (PROVENTIL HFA;VENTOLIN HFA) 108 (90 BASE) MCG/ACT inhaler, Inhale 2 puffs into the lungs every 6 (six) hours as needed for wheezing or shortness of breath. PROAIR, Disp: , Rfl:  .  aspirin EC 81 MG tablet, Take 81 mg by mouth at bedtime., Disp: , Rfl:  .  b  complex vitamins tablet, Take 1 tablet by mouth daily., Disp: , Rfl:  .  benzonatate (TESSALON) 100 MG capsule, Take 1 capsule (100 mg total) by mouth every 8 (eight) hours. (Patient taking differently: Take 100 mg by mouth 3 (three) times daily as needed for cough. ), Disp: 21 capsule, Rfl: 0 .  cetirizine (ZYRTEC) 10 MG tablet, Take 10 mg by mouth See admin instructions. Take 1 tablet (10 mg) twice daily during allergy season, Disp: , Rfl:  .  Chlorpheniramine Maleate (CHLOR-TRIMETON PO), Take 1 tablet by mouth See admin instructions. Take 1 tablet four times daily during allergy season, Disp: , Rfl:  .  clonazePAM (KLONOPIN) 0.5 MG tablet, Take 0.5 mg by mouth 3 (three) times daily. , Disp: , Rfl: 0 .  cyclobenzaprine (FLEXERIL) 10 MG tablet, Take 10 mg by mouth See admin instructions. Take 1 tablet (10 mg) every night at bedtime, may take an additional tablet during the day as needed for muscle spasms, Disp: , Rfl: 0 .  dextromethorphan-guaiFENesin (MUCINEX DM) 30-600 MG per 12 hr tablet, Take 1 tablet by mouth 2 (two) times daily as needed for cough (congestion)., Disp: , Rfl:  .  diclofenac sodium (VOLTAREN) 1 % GEL, Apply 1 application topically 2 (two) times daily as needed (arthritis pain - hands)., Disp: , Rfl:  .  doxycycline (VIBRAMYCIN) 100 MG capsule, Take 1 capsule (100 mg total) by mouth 2 (two) times daily., Disp: 14 capsule, Rfl: 0 .  erythromycin ophthalmic ointment, Place 1 application into both eyes 2 (two) times daily. Started 07/18/14 for pink eye, Disp: , Rfl: 0 .  fenofibrate 160 MG tablet, Take 160 mg by mouth daily with supper. , Disp: , Rfl:  .  ferrous sulfate 325 (65 FE) MG tablet, Take 325 mg by mouth daily with breakfast., Disp: , Rfl:  .  fluconazole (DIFLUCAN) 150 MG tablet, Take 1 tablet (150 mg total) by mouth once. 1 tab po x 1. May repeat in 72 hours if no improvement, Disp: 2 tablet, Rfl: 1 .  FLUoxetine (PROZAC) 40 MG capsule, Take 40 mg by mouth at bedtime. ,  Disp: , Rfl: 1 .  Fluticasone Propionate (FLONASE ALLERGY RELIEF NA), Place 2 sprays into both nostrils See admin instructions. Use 2 sprays each nare daily at bedtime during allergy season, Disp: , Rfl:  .  gabapentin (NEURONTIN) 100 MG capsule, Take 100 mg by mouth 3 (three) times daily., Disp: , Rfl:  .  hydrochlorothiazide (MICROZIDE) 12.5 MG capsule, Take 12.5 mg by mouth daily. , Disp: , Rfl: 1 .  insulin aspart (NOVOLOG) 100 UNIT/ML injection, Inject 5-25 Units into the skin 3 (three) times daily as needed for high blood sugar (CBG >120). Per sliding scale, Disp: , Rfl:  .  insulin detemir (LEVEMIR) 100 UNIT/ML injection, Inject 0.1 mLs (10 Units total) into the skin 2 (two) times daily. (Patient taking differently: Inject 35 Units into the skin 2 (two) times daily. ), Disp: 10 mL, Rfl: 11 .  levothyroxine (SYNTHROID, LEVOTHROID) 75 MCG tablet, Take 37.5 mcg by mouth See admin instructions. Take 4 times a week only. Sunday, Tuesday, Thursday, and Saturday., Disp: , Rfl:  .  metoprolol (LOPRESSOR) 50 MG tablet, Take 50 mg by mouth 3 (three) times daily. , Disp: , Rfl: 0 .  mometasone (ASMANEX) 220 MCG/INH inhaler, Inhale 1 puff into the lungs 2 (two) times daily., Disp: , Rfl:  .  montelukast (SINGULAIR) 10 MG tablet, Take 10 mg by mouth daily. , Disp: , Rfl:  .  Multiple Vitamins-Minerals (HAIR/SKIN/NAILS/BIOTIN) TABS, Take 1 tablet by mouth daily., Disp: , Rfl:  .  mupirocin ointment (BACTROBAN) 2 %, Apply 1 application topically 3 (three) times daily., Disp: 22 g, Rfl: 0 .  nystatin cream (MYCOSTATIN), Apply to affected area 2 times daily till symptoms resolve, Disp: 30 g, Rfl: 0 .  omeprazole (PRILOSEC) 20 MG capsule, Take 20 mg by mouth daily with supper. , Disp: , Rfl:  .  ondansetron (ZOFRAN-ODT) 4 MG disintegrating tablet, Take 4 mg by mouth daily as needed (nausea from pain medication). , Disp: , Rfl: 0 .  Phentermine-Topiramate 11.25-69 MG CP24, Take 1 tablet by mouth daily., Disp: ,  Rfl:  .  potassium citrate (UROCIT-K) 10 MEQ (1080 MG) SR tablet, Take 10 mEq by mouth 4 (four) times daily. , Disp: , Rfl: 1 .  Probiotic Product (PROBIOTIC DAILY PO), Take 1 capsule by mouth daily. , Disp: , Rfl:  .  rOPINIRole (REQUIP) 2 MG tablet, Take 2 mg by mouth 4 (four) times daily. , Disp: , Rfl:  .  zolpidem (AMBIEN) 5 MG tablet, Take 5 mg by mouth at bedtime as needed for sleep. , Disp: , Rfl: 0  Allergies  Allergen Reactions  . Codeine Nausea Only  . Imitrex [Sumatriptan] Other (See Comments)    Facial spasms  . Morphine Other (See Comments)    Severe headaches   . Nsaids Other (See Comments)    PT UNABLE TO TOLERATE NSAID'S DRUGS DUE TO HX OF GASTRIC SLEEVE SURGERY  . Promethazine Hcl Other (See Comments)    Restless leg feeling all over body  . Statins Other (See Comments)    Leg/muscle pain  . Sulfonamide Derivatives Hives  . Vicodin [Hydrocodone-Acetaminophen] Nausea And Vomiting     ROS  As noted in HPI.   Physical Exam  BP 136/60 (BP Location: Left Arm)   Pulse 67   Temp 98.4 F (36.9 C) (Oral)   Resp 14   SpO2 100%   Constitutional: Well developed, well nourished, no acute distress Eyes:  EOMI, conjunctiva normal bilaterally HENT: Normocephalic, atraumatic,mucus membranes moist Respiratory: Normal inspiratory effort Cardiovascular: Normal rate GI: nondistended skin: Erythematous friable macerated symmetric rash measuring 6.5 x 12 x 5 cm on right lower pannus. Marked area with marker for reference No crepitus, no tenderness. No drainage. See picture    Musculoskeletal: no deformities Neurologic: Alert & oriented x 3, no focal neuro deficits Psychiatric: Speech and behavior appropriate   ED Course   Medications - No data to display  No orders  of the defined types were placed in this encounter.   No results found for this or any previous visit (from the past 24 hour(s)). No results found.  ED Clinical Impression  Intertrigo   ED  Assessment/Plan  Patient consistent with intertrigo/yeast infection. Doubt necrotizing fasciitis or an abscess that would require I&D at this time. Plan to send home with Diflucan 150 mg by mouth 2 take 1 now and repeat in 72 hours, nystatin, Bactroban, close follow-up with her primary care physician. Discussed MDM, plan and followup with patient. Discussed sn/sx that should prompt return to the  ED. Patient agrees with plan.    *This clinic note was created using Dragon dictation software. Therefore, there may be occasional mistakes despite careful proofreading.  ?    Melynda Ripple, MD 01/05/16 2223

## 2016-05-04 DIAGNOSIS — H01006 Unspecified blepharitis left eye, unspecified eyelid: Secondary | ICD-10-CM

## 2016-05-04 DIAGNOSIS — H02401 Unspecified ptosis of right eyelid: Secondary | ICD-10-CM | POA: Insufficient documentation

## 2016-05-04 DIAGNOSIS — H5021 Vertical strabismus, right eye: Secondary | ICD-10-CM | POA: Insufficient documentation

## 2016-05-04 DIAGNOSIS — H01003 Unspecified blepharitis right eye, unspecified eyelid: Secondary | ICD-10-CM | POA: Insufficient documentation

## 2016-05-04 DIAGNOSIS — Z961 Presence of intraocular lens: Secondary | ICD-10-CM | POA: Insufficient documentation

## 2016-05-06 ENCOUNTER — Encounter (HOSPITAL_COMMUNITY): Payer: Self-pay

## 2016-05-06 ENCOUNTER — Emergency Department (HOSPITAL_COMMUNITY)
Admission: EM | Admit: 2016-05-06 | Discharge: 2016-05-06 | Disposition: A | Discharge status: Home / Self Care | Payer: 59 | Attending: Emergency Medicine | Admitting: Emergency Medicine

## 2016-05-06 DIAGNOSIS — Z2914 Encounter for prophylactic rabies immune globin: Secondary | ICD-10-CM | POA: Diagnosis not present

## 2016-05-06 DIAGNOSIS — Z79899 Other long term (current) drug therapy: Secondary | ICD-10-CM | POA: Diagnosis not present

## 2016-05-06 DIAGNOSIS — Z203 Contact with and (suspected) exposure to rabies: Secondary | ICD-10-CM | POA: Diagnosis not present

## 2016-05-06 DIAGNOSIS — Z794 Long term (current) use of insulin: Secondary | ICD-10-CM | POA: Diagnosis not present

## 2016-05-06 DIAGNOSIS — I10 Essential (primary) hypertension: Secondary | ICD-10-CM | POA: Diagnosis not present

## 2016-05-06 DIAGNOSIS — J45909 Unspecified asthma, uncomplicated: Secondary | ICD-10-CM | POA: Diagnosis not present

## 2016-05-06 DIAGNOSIS — E039 Hypothyroidism, unspecified: Secondary | ICD-10-CM | POA: Diagnosis not present

## 2016-05-06 DIAGNOSIS — Y999 Unspecified external cause status: Secondary | ICD-10-CM | POA: Diagnosis not present

## 2016-05-06 DIAGNOSIS — Z7982 Long term (current) use of aspirin: Secondary | ICD-10-CM | POA: Insufficient documentation

## 2016-05-06 DIAGNOSIS — S50812A Abrasion of left forearm, initial encounter: Secondary | ICD-10-CM | POA: Diagnosis not present

## 2016-05-06 DIAGNOSIS — Y939 Activity, unspecified: Secondary | ICD-10-CM | POA: Insufficient documentation

## 2016-05-06 DIAGNOSIS — W5581XA Bitten by other mammals, initial encounter: Secondary | ICD-10-CM | POA: Insufficient documentation

## 2016-05-06 DIAGNOSIS — H1032 Unspecified acute conjunctivitis, left eye: Secondary | ICD-10-CM | POA: Insufficient documentation

## 2016-05-06 DIAGNOSIS — E119 Type 2 diabetes mellitus without complications: Secondary | ICD-10-CM | POA: Insufficient documentation

## 2016-05-06 DIAGNOSIS — Y929 Unspecified place or not applicable: Secondary | ICD-10-CM | POA: Diagnosis not present

## 2016-05-06 DIAGNOSIS — Z96653 Presence of artificial knee joint, bilateral: Secondary | ICD-10-CM | POA: Diagnosis not present

## 2016-05-06 MED ORDER — POLYMYXIN B-TRIMETHOPRIM 10000-0.1 UNIT/ML-% OP SOLN: 2.0000 [drp] | OPHTHALMIC | 0 refills | Status: AC

## 2016-05-06 MED ORDER — RABIES VACCINE, PCEC IM SUSR
1.0000 mL | Freq: Once | INTRAMUSCULAR | Status: AC
  Administered 2016-05-06: 1 mL via INTRAMUSCULAR
  Filled 2016-05-06: qty 1

## 2016-05-06 MED ORDER — TETRACAINE HCL 0.5 % OP SOLN
2.0000 [drp] | Freq: Once | OPHTHALMIC | Status: AC
  Administered 2016-05-06: 2 [drp] via OPHTHALMIC
  Filled 2016-05-06: qty 2

## 2016-05-06 MED ORDER — RABIES IMMUNE GLOBULIN 150 UNIT/ML IM INJ
20.0000 [IU]/kg | INJECTION | Freq: Once | INTRAMUSCULAR | Status: AC
  Administered 2016-05-06: 2250 [IU] via INTRAMUSCULAR
  Filled 2016-05-06: qty 15

## 2016-05-06 MED ORDER — FLUORESCEIN SODIUM 0.6 MG OP STRP
1.0000 | ORAL_STRIP | Freq: Once | OPHTHALMIC | Status: AC
  Administered 2016-05-06: 1 via OPHTHALMIC
  Filled 2016-05-06: qty 1

## 2016-05-06 MED ORDER — TETANUS-DIPHTH-ACELL PERTUSSIS 5-2.5-18.5 LF-MCG/0.5 IM SUSP
0.5000 mL | Freq: Once | INTRAMUSCULAR | Status: AC
  Administered 2016-05-06: 0.5 mL via INTRAMUSCULAR
  Filled 2016-05-06: qty 0.5

## 2016-05-06 NOTE — ED Notes (Signed)
After discussion with Dr. Lenoria Chime and pharmacy. Pt was given 3 2.5 cc injections to laterl ventral at distal, medial, and proximal lateral ventral on left by B Black RN and 2.5 cc rabies IG given at distal and proximal right lateral ventral and 1.5 cc tio medial right ventral by Joanell Rising, RN. 1 Cc Rabies IG infiltrated around around wound site at left forearmn by B. Black Therapist, sports.

## 2016-05-06 NOTE — ED Notes (Signed)
Pt ambulates to br with steady gait.

## 2016-05-06 NOTE — Discharge Instructions (Signed)
You have been prophylactically treated with rabies vaccine and had an updated tetanus shot today in the emergency department. I recommend following up with your primary care provider in the next 4-5 days as needed. Return to the emergency department if symptoms worsen or new onset of fever, chills, body ache, chest pain, shortness of breath, abdominal pain, vomiting, headache, neck stiffness, redness, swelling, warmth, drainage.  Place 2 eye drops in your left eye 4 times daily for the next 7 days. I recommend following up with your eye doctor if your symptoms have not improved in the next 2 days. Return to the ED if your symptoms worsen.

## 2016-05-06 NOTE — ED Notes (Signed)
Pt states she understands instrructions Home stable with steady gait. Denies discomfort, swelling or bleeding at injection site.
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
## 2016-05-06 NOTE — ED Notes (Signed)
Discharge instructions discussed at length. Pt staes she understands MD asnd vacine follow up.
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
## 2016-06-21 DIAGNOSIS — R600 Localized edema: Secondary | ICD-10-CM | POA: Diagnosis not present

## 2016-06-21 DIAGNOSIS — Z6841 Body Mass Index (BMI) 40.0 and over, adult: Secondary | ICD-10-CM | POA: Diagnosis not present

## 2016-06-21 DIAGNOSIS — L0889 Other specified local infections of the skin and subcutaneous tissue: Secondary | ICD-10-CM | POA: Diagnosis not present

## 2016-06-25 DIAGNOSIS — Z6841 Body Mass Index (BMI) 40.0 and over, adult: Secondary | ICD-10-CM | POA: Diagnosis not present

## 2016-06-25 DIAGNOSIS — L0889 Other specified local infections of the skin and subcutaneous tissue: Secondary | ICD-10-CM | POA: Diagnosis not present

## 2016-06-25 DIAGNOSIS — I10 Essential (primary) hypertension: Secondary | ICD-10-CM | POA: Diagnosis not present

## 2016-06-25 DIAGNOSIS — R600 Localized edema: Secondary | ICD-10-CM | POA: Diagnosis not present

## 2016-06-25 DIAGNOSIS — E1129 Type 2 diabetes mellitus with other diabetic kidney complication: Secondary | ICD-10-CM | POA: Diagnosis not present

## 2016-07-07 DIAGNOSIS — M25551 Pain in right hip: Secondary | ICD-10-CM | POA: Diagnosis not present

## 2016-07-09 ENCOUNTER — Emergency Department (HOSPITAL_COMMUNITY): Payer: PPO

## 2016-07-09 ENCOUNTER — Emergency Department (HOSPITAL_COMMUNITY)
Admission: EM | Admit: 2016-07-09 | Discharge: 2016-07-09 | Disposition: A | Discharge status: Home / Self Care | Payer: PPO | Attending: Emergency Medicine | Admitting: Emergency Medicine

## 2016-07-09 DIAGNOSIS — Z7982 Long term (current) use of aspirin: Secondary | ICD-10-CM | POA: Diagnosis not present

## 2016-07-09 DIAGNOSIS — M25551 Pain in right hip: Secondary | ICD-10-CM | POA: Diagnosis not present

## 2016-07-09 DIAGNOSIS — E119 Type 2 diabetes mellitus without complications: Secondary | ICD-10-CM | POA: Diagnosis not present

## 2016-07-09 DIAGNOSIS — E039 Hypothyroidism, unspecified: Secondary | ICD-10-CM | POA: Insufficient documentation

## 2016-07-09 DIAGNOSIS — Z794 Long term (current) use of insulin: Secondary | ICD-10-CM | POA: Diagnosis not present

## 2016-07-09 DIAGNOSIS — J45909 Unspecified asthma, uncomplicated: Secondary | ICD-10-CM | POA: Insufficient documentation

## 2016-07-09 DIAGNOSIS — Z96651 Presence of right artificial knee joint: Secondary | ICD-10-CM | POA: Insufficient documentation

## 2016-07-09 DIAGNOSIS — Z79899 Other long term (current) drug therapy: Secondary | ICD-10-CM | POA: Diagnosis not present

## 2016-07-09 DIAGNOSIS — I10 Essential (primary) hypertension: Secondary | ICD-10-CM | POA: Diagnosis not present

## 2016-07-09 DIAGNOSIS — M7918 Myalgia, other site: Secondary | ICD-10-CM
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
## 2016-07-09 NOTE — ED Triage Notes (Signed)
Pt reports falling earlier this week while getting in the tube, pt sent here by PCP for further eval, xrays completed previously this week, pt reports pain, pt ambulatory with walker & pain, no obvious deformity or shortening, A&O x4

## 2016-07-13 ENCOUNTER — Other Ambulatory Visit: Payer: Self-pay | Admitting: Orthopaedic Surgery

## 2016-07-13 DIAGNOSIS — M25551 Pain in right hip: Secondary | ICD-10-CM

## 2016-07-13 DIAGNOSIS — M5416 Radiculopathy, lumbar region: Secondary | ICD-10-CM | POA: Diagnosis not present

## 2016-07-14 DIAGNOSIS — M25561 Pain in right knee: Secondary | ICD-10-CM | POA: Diagnosis not present

## 2016-07-19 DIAGNOSIS — M545 Low back pain: Secondary | ICD-10-CM | POA: Diagnosis not present

## 2016-07-23 DIAGNOSIS — H02413 Mechanical ptosis of bilateral eyelids: Secondary | ICD-10-CM | POA: Diagnosis not present

## 2016-07-23 DIAGNOSIS — H02423 Myogenic ptosis of bilateral eyelids: Secondary | ICD-10-CM | POA: Diagnosis not present

## 2016-07-27 ENCOUNTER — Inpatient Hospital Stay
Admission: RE | Admit: 2016-07-27 | Discharge: 2016-07-27 | Disposition: A | Discharge status: Home / Self Care | Payer: PPO | Source: Ambulatory Visit | Attending: Orthopaedic Surgery | Admitting: Orthopaedic Surgery

## 2016-07-27 DIAGNOSIS — M25561 Pain in right knee: Secondary | ICD-10-CM | POA: Diagnosis not present

## 2016-07-29 DIAGNOSIS — M5416 Radiculopathy, lumbar region: Secondary | ICD-10-CM | POA: Diagnosis not present

## 2016-08-04 DIAGNOSIS — M5416 Radiculopathy, lumbar region: Secondary | ICD-10-CM | POA: Diagnosis not present

## 2016-08-06 DIAGNOSIS — M5416 Radiculopathy, lumbar region: Secondary | ICD-10-CM | POA: Diagnosis not present

## 2016-08-10 DIAGNOSIS — M5416 Radiculopathy, lumbar region: Secondary | ICD-10-CM | POA: Diagnosis not present

## 2016-08-12 DIAGNOSIS — E119 Type 2 diabetes mellitus without complications: Secondary | ICD-10-CM | POA: Diagnosis not present

## 2016-08-12 DIAGNOSIS — L03119 Cellulitis of unspecified part of limb: Secondary | ICD-10-CM | POA: Diagnosis not present

## 2016-08-12 DIAGNOSIS — R269 Unspecified abnormalities of gait and mobility: Secondary | ICD-10-CM | POA: Diagnosis not present

## 2016-08-12 DIAGNOSIS — G4733 Obstructive sleep apnea (adult) (pediatric): Secondary | ICD-10-CM | POA: Diagnosis not present

## 2016-08-17 DIAGNOSIS — M5416 Radiculopathy, lumbar region: Secondary | ICD-10-CM | POA: Diagnosis not present

## 2016-08-19 DIAGNOSIS — M5416 Radiculopathy, lumbar region: Secondary | ICD-10-CM | POA: Diagnosis not present

## 2016-08-24 DIAGNOSIS — M5416 Radiculopathy, lumbar region: Secondary | ICD-10-CM | POA: Diagnosis not present

## 2016-08-27 DIAGNOSIS — M5416 Radiculopathy, lumbar region: Secondary | ICD-10-CM | POA: Diagnosis not present

## 2016-08-31 DIAGNOSIS — M5416 Radiculopathy, lumbar region: Secondary | ICD-10-CM | POA: Diagnosis not present

## 2016-09-02 DIAGNOSIS — M5416 Radiculopathy, lumbar region: Secondary | ICD-10-CM | POA: Diagnosis not present

## 2016-09-08 ENCOUNTER — Encounter (HOSPITAL_COMMUNITY): Payer: Self-pay | Admitting: Emergency Medicine

## 2016-09-08 DIAGNOSIS — Z79899 Other long term (current) drug therapy: Secondary | ICD-10-CM | POA: Insufficient documentation

## 2016-09-08 DIAGNOSIS — Z7982 Long term (current) use of aspirin: Secondary | ICD-10-CM | POA: Diagnosis not present

## 2016-09-08 DIAGNOSIS — J45909 Unspecified asthma, uncomplicated: Secondary | ICD-10-CM | POA: Insufficient documentation

## 2016-09-08 DIAGNOSIS — M7989 Other specified soft tissue disorders: Secondary | ICD-10-CM | POA: Insufficient documentation

## 2016-09-08 DIAGNOSIS — I10 Essential (primary) hypertension: Secondary | ICD-10-CM | POA: Diagnosis not present

## 2016-09-08 DIAGNOSIS — Z96651 Presence of right artificial knee joint: Secondary | ICD-10-CM | POA: Insufficient documentation

## 2016-09-08 DIAGNOSIS — Z794 Long term (current) use of insulin: Secondary | ICD-10-CM | POA: Insufficient documentation

## 2016-09-08 DIAGNOSIS — E119 Type 2 diabetes mellitus without complications: Secondary | ICD-10-CM | POA: Diagnosis not present

## 2016-09-08 DIAGNOSIS — E039 Hypothyroidism, unspecified: Secondary | ICD-10-CM | POA: Diagnosis not present

## 2016-09-08 DIAGNOSIS — R58 Hemorrhage, not elsewhere classified: Secondary | ICD-10-CM | POA: Diagnosis not present

## 2016-09-08 DIAGNOSIS — S8001XA Contusion of right knee, initial encounter: Secondary | ICD-10-CM | POA: Diagnosis not present

## 2016-09-08 NOTE — ED Triage Notes (Addendum)
Pt here with c/o bruising behind right knee. Sent by PCP, for possible DVT. Pt denies having DVT hx, denies pain, endorses tingling and tightness.

## 2016-09-09 ENCOUNTER — Emergency Department (HOSPITAL_COMMUNITY)
Admission: EM | Admit: 2016-09-09 | Discharge: 2016-09-09 | Disposition: A | Discharge status: Home / Self Care | Payer: PPO | Attending: Emergency Medicine | Admitting: Emergency Medicine

## 2016-09-09 ENCOUNTER — Ambulatory Visit (HOSPITAL_COMMUNITY): Payer: PPO

## 2016-09-09 DIAGNOSIS — M7989 Other specified soft tissue disorders: Secondary | ICD-10-CM

## 2016-09-09 DIAGNOSIS — R58 Hemorrhage, not elsewhere classified: Secondary | ICD-10-CM

## 2016-09-09 LAB — I-STAT CHEM 8, ED
BUN: 18 mg/dL (ref 6–20)
Calcium, Ion: 1.18 mmol/L (ref 1.15–1.40)
Chloride: 98 mmol/L — ABNORMAL LOW (ref 101–111)
Creatinine, Ser: 1 mg/dL (ref 0.44–1.00)
Glucose, Bld: 174 mg/dL — ABNORMAL HIGH (ref 65–99)
HCT: 28 % — ABNORMAL LOW (ref 36.0–46.0)
Hemoglobin: 9.5 g/dL — ABNORMAL LOW (ref 12.0–15.0)
Potassium: 4 mmol/L (ref 3.5–5.1)
Sodium: 138 mmol/L (ref 135–145)
TCO2: 29 mmol/L (ref 0–100)

## 2016-09-09 LAB — CBC
HCT: 31.1 % — ABNORMAL LOW (ref 36.0–46.0)
Hemoglobin: 9.8 g/dL — ABNORMAL LOW (ref 12.0–15.0)
MCH: 25.6 pg — ABNORMAL LOW (ref 26.0–34.0)
MCHC: 31.5 g/dL (ref 30.0–36.0)
MCV: 81.2 fL (ref 78.0–100.0)
Platelets: 169 10*3/uL (ref 150–400)
RBC: 3.83 MIL/uL — ABNORMAL LOW (ref 3.87–5.11)
RDW: 15.1 % (ref 11.5–15.5)
WBC: 4.1 10*3/uL (ref 4.0–10.5)

## 2016-09-09 LAB — PROTIME-INR
INR: 1.06
Prothrombin Time: 13.8 seconds (ref 11.4–15.2)

## 2016-09-09 LAB — APTT: aPTT: 28 seconds (ref 24–36)

## 2016-09-09 NOTE — Discharge Instructions (Signed)
Labs here are normal. Ultrasound for the legs have been ordered - the hospital will call you to set up the appointment.
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
## 2016-09-09 NOTE — ED Notes (Signed)
Unable to sign. No signature pad on hall computer

## 2016-09-09 NOTE — ED Notes (Signed)
ED Provider at bedside. 

## 2016-09-10 ENCOUNTER — Ambulatory Visit (HOSPITAL_COMMUNITY)
Admission: RE | Admit: 2016-09-10 | Discharge: 2016-09-10 | Disposition: A | Discharge status: Home / Self Care | Payer: PPO | Source: Ambulatory Visit | Attending: Emergency Medicine | Admitting: Emergency Medicine

## 2016-09-10 DIAGNOSIS — M7989 Other specified soft tissue disorders: Secondary | ICD-10-CM

## 2016-09-10 NOTE — Progress Notes (Signed)
**  Preliminary report by tech**  Bilateral lower extremity venous duplex completed. There is no evidence of deep or superficial vein thrombosis involving the right and left lower extremities. All visualized vessels appear patent and compressible. There is no evidence of Baker's cysts bilaterally.  09/10/16 9:01 AM Carlos Levering RVT

## 2016-09-12 DIAGNOSIS — E119 Type 2 diabetes mellitus without complications: Secondary | ICD-10-CM | POA: Diagnosis not present

## 2016-09-12 DIAGNOSIS — R269 Unspecified abnormalities of gait and mobility: Secondary | ICD-10-CM | POA: Diagnosis not present

## 2016-09-12 DIAGNOSIS — G4733 Obstructive sleep apnea (adult) (pediatric): Secondary | ICD-10-CM | POA: Diagnosis not present

## 2016-09-12 DIAGNOSIS — L03119 Cellulitis of unspecified part of limb: Secondary | ICD-10-CM | POA: Diagnosis not present

## 2016-09-14 DIAGNOSIS — M5416 Radiculopathy, lumbar region: Secondary | ICD-10-CM | POA: Diagnosis not present

## 2016-09-16 DIAGNOSIS — Z6841 Body Mass Index (BMI) 40.0 and over, adult: Secondary | ICD-10-CM | POA: Diagnosis not present

## 2016-09-16 DIAGNOSIS — F338 Other recurrent depressive disorders: Secondary | ICD-10-CM | POA: Diagnosis not present

## 2016-09-16 DIAGNOSIS — E1129 Type 2 diabetes mellitus with other diabetic kidney complication: Secondary | ICD-10-CM | POA: Diagnosis not present

## 2016-09-16 DIAGNOSIS — D649 Anemia, unspecified: Secondary | ICD-10-CM | POA: Diagnosis not present

## 2016-09-16 DIAGNOSIS — Z1389 Encounter for screening for other disorder: Secondary | ICD-10-CM | POA: Diagnosis not present

## 2016-09-16 DIAGNOSIS — I10 Essential (primary) hypertension: Secondary | ICD-10-CM | POA: Diagnosis not present

## 2016-09-16 DIAGNOSIS — G2581 Restless legs syndrome: Secondary | ICD-10-CM | POA: Diagnosis not present

## 2016-09-16 DIAGNOSIS — M1990 Unspecified osteoarthritis, unspecified site: Secondary | ICD-10-CM | POA: Diagnosis not present

## 2016-09-16 DIAGNOSIS — R600 Localized edema: Secondary | ICD-10-CM | POA: Diagnosis not present

## 2016-09-16 DIAGNOSIS — E038 Other specified hypothyroidism: Secondary | ICD-10-CM | POA: Diagnosis not present

## 2016-09-20 DIAGNOSIS — L0889 Other specified local infections of the skin and subcutaneous tissue: Secondary | ICD-10-CM | POA: Diagnosis not present

## 2016-09-20 DIAGNOSIS — Z6841 Body Mass Index (BMI) 40.0 and over, adult: Secondary | ICD-10-CM | POA: Diagnosis not present

## 2016-09-20 DIAGNOSIS — L304 Erythema intertrigo: Secondary | ICD-10-CM | POA: Diagnosis not present

## 2016-09-23 DIAGNOSIS — M5416 Radiculopathy, lumbar region: Secondary | ICD-10-CM | POA: Diagnosis not present

## 2016-09-27 DIAGNOSIS — H02412 Mechanical ptosis of left eyelid: Secondary | ICD-10-CM | POA: Diagnosis not present

## 2016-09-27 DIAGNOSIS — H02411 Mechanical ptosis of right eyelid: Secondary | ICD-10-CM | POA: Diagnosis not present

## 2016-09-27 DIAGNOSIS — H02422 Myogenic ptosis of left eyelid: Secondary | ICD-10-CM | POA: Diagnosis not present

## 2016-09-27 DIAGNOSIS — H02421 Myogenic ptosis of right eyelid: Secondary | ICD-10-CM | POA: Diagnosis not present

## 2016-09-27 DIAGNOSIS — H02413 Mechanical ptosis of bilateral eyelids: Secondary | ICD-10-CM | POA: Diagnosis not present

## 2016-09-27 DIAGNOSIS — H02423 Myogenic ptosis of bilateral eyelids: Secondary | ICD-10-CM | POA: Diagnosis not present

## 2016-10-04 DIAGNOSIS — F418 Other specified anxiety disorders: Secondary | ICD-10-CM | POA: Diagnosis not present

## 2016-10-04 DIAGNOSIS — Z9989 Dependence on other enabling machines and devices: Secondary | ICD-10-CM | POA: Diagnosis not present

## 2016-10-04 DIAGNOSIS — F339 Major depressive disorder, recurrent, unspecified: Secondary | ICD-10-CM | POA: Diagnosis not present

## 2016-10-04 DIAGNOSIS — D649 Anemia, unspecified: Secondary | ICD-10-CM | POA: Diagnosis not present

## 2016-10-04 DIAGNOSIS — G4733 Obstructive sleep apnea (adult) (pediatric): Secondary | ICD-10-CM | POA: Diagnosis not present

## 2016-10-04 DIAGNOSIS — Z6841 Body Mass Index (BMI) 40.0 and over, adult: Secondary | ICD-10-CM | POA: Diagnosis not present

## 2016-10-05 DIAGNOSIS — M5416 Radiculopathy, lumbar region: Secondary | ICD-10-CM | POA: Diagnosis not present

## 2016-10-12 DIAGNOSIS — L03119 Cellulitis of unspecified part of limb: Secondary | ICD-10-CM | POA: Diagnosis not present

## 2016-10-12 DIAGNOSIS — R269 Unspecified abnormalities of gait and mobility: Secondary | ICD-10-CM | POA: Diagnosis not present

## 2016-10-12 DIAGNOSIS — M5416 Radiculopathy, lumbar region: Secondary | ICD-10-CM | POA: Diagnosis not present

## 2016-10-12 DIAGNOSIS — E119 Type 2 diabetes mellitus without complications: Secondary | ICD-10-CM | POA: Diagnosis not present

## 2016-10-12 DIAGNOSIS — G4733 Obstructive sleep apnea (adult) (pediatric): Secondary | ICD-10-CM | POA: Diagnosis not present

## 2016-10-14 DIAGNOSIS — M5416 Radiculopathy, lumbar region: Secondary | ICD-10-CM | POA: Diagnosis not present

## 2016-10-18 DIAGNOSIS — M48062 Spinal stenosis, lumbar region with neurogenic claudication: Secondary | ICD-10-CM | POA: Diagnosis not present

## 2016-10-19 DIAGNOSIS — M5416 Radiculopathy, lumbar region: Secondary | ICD-10-CM | POA: Diagnosis not present

## 2016-10-21 DIAGNOSIS — M5416 Radiculopathy, lumbar region: Secondary | ICD-10-CM | POA: Diagnosis not present

## 2016-10-22 DIAGNOSIS — Z6841 Body Mass Index (BMI) 40.0 and over, adult: Secondary | ICD-10-CM | POA: Diagnosis not present

## 2016-10-22 DIAGNOSIS — S31105A Unspecified open wound of abdominal wall, periumbilic region without penetration into peritoneal cavity, initial encounter: Secondary | ICD-10-CM | POA: Diagnosis not present

## 2016-10-22 DIAGNOSIS — L304 Erythema intertrigo: Secondary | ICD-10-CM | POA: Diagnosis not present

## 2016-10-27 DIAGNOSIS — N390 Urinary tract infection, site not specified: Secondary | ICD-10-CM | POA: Diagnosis not present

## 2016-10-27 DIAGNOSIS — E038 Other specified hypothyroidism: Secondary | ICD-10-CM | POA: Diagnosis not present

## 2016-10-27 DIAGNOSIS — N3941 Urge incontinence: Secondary | ICD-10-CM | POA: Diagnosis not present

## 2016-11-01 DIAGNOSIS — E119 Type 2 diabetes mellitus without complications: Secondary | ICD-10-CM | POA: Diagnosis not present

## 2016-11-01 DIAGNOSIS — N3941 Urge incontinence: Secondary | ICD-10-CM | POA: Diagnosis not present

## 2016-11-01 DIAGNOSIS — S31105D Unspecified open wound of abdominal wall, periumbilic region without penetration into peritoneal cavity, subsequent encounter: Secondary | ICD-10-CM | POA: Diagnosis not present

## 2016-11-01 DIAGNOSIS — L304 Erythema intertrigo: Secondary | ICD-10-CM | POA: Diagnosis not present

## 2016-11-01 DIAGNOSIS — Z6841 Body Mass Index (BMI) 40.0 and over, adult: Secondary | ICD-10-CM | POA: Diagnosis not present

## 2016-11-04 DIAGNOSIS — M5416 Radiculopathy, lumbar region: Secondary | ICD-10-CM | POA: Diagnosis not present

## 2016-11-06 DIAGNOSIS — M25552 Pain in left hip: Secondary | ICD-10-CM | POA: Diagnosis not present

## 2016-11-08 ENCOUNTER — Other Ambulatory Visit: Payer: Self-pay | Admitting: Internal Medicine

## 2016-11-08 DIAGNOSIS — R4182 Altered mental status, unspecified: Secondary | ICD-10-CM | POA: Diagnosis not present

## 2016-11-08 DIAGNOSIS — I10 Essential (primary) hypertension: Secondary | ICD-10-CM | POA: Diagnosis not present

## 2016-11-08 DIAGNOSIS — R2689 Other abnormalities of gait and mobility: Secondary | ICD-10-CM | POA: Diagnosis not present

## 2016-11-08 DIAGNOSIS — R8299 Other abnormal findings in urine: Secondary | ICD-10-CM | POA: Diagnosis not present

## 2016-11-08 DIAGNOSIS — E1129 Type 2 diabetes mellitus with other diabetic kidney complication: Secondary | ICD-10-CM | POA: Diagnosis not present

## 2016-11-08 DIAGNOSIS — G44301 Post-traumatic headache, unspecified, intractable: Secondary | ICD-10-CM

## 2016-11-08 DIAGNOSIS — N390 Urinary tract infection, site not specified: Secondary | ICD-10-CM | POA: Diagnosis not present

## 2016-11-08 DIAGNOSIS — Z6841 Body Mass Index (BMI) 40.0 and over, adult: Secondary | ICD-10-CM | POA: Diagnosis not present

## 2016-11-08 DIAGNOSIS — G44309 Post-traumatic headache, unspecified, not intractable: Secondary | ICD-10-CM | POA: Diagnosis not present

## 2016-11-11 DIAGNOSIS — M5416 Radiculopathy, lumbar region: Secondary | ICD-10-CM | POA: Diagnosis not present

## 2016-11-12 ENCOUNTER — Encounter (HOSPITAL_BASED_OUTPATIENT_CLINIC_OR_DEPARTMENT_OTHER): Payer: PPO | Attending: Internal Medicine

## 2016-11-12 DIAGNOSIS — Z9989 Dependence on other enabling machines and devices: Secondary | ICD-10-CM | POA: Diagnosis not present

## 2016-11-12 DIAGNOSIS — G4730 Sleep apnea, unspecified: Secondary | ICD-10-CM | POA: Insufficient documentation

## 2016-11-12 DIAGNOSIS — S31104A Unspecified open wound of abdominal wall, left lower quadrant without penetration into peritoneal cavity, initial encounter: Secondary | ICD-10-CM | POA: Diagnosis not present

## 2016-11-12 DIAGNOSIS — I890 Lymphedema, not elsewhere classified: Secondary | ICD-10-CM | POA: Insufficient documentation

## 2016-11-12 DIAGNOSIS — S31109A Unspecified open wound of abdominal wall, unspecified quadrant without penetration into peritoneal cavity, initial encounter: Secondary | ICD-10-CM | POA: Diagnosis not present

## 2016-11-12 DIAGNOSIS — L03119 Cellulitis of unspecified part of limb: Secondary | ICD-10-CM | POA: Diagnosis not present

## 2016-11-12 DIAGNOSIS — Z6841 Body Mass Index (BMI) 40.0 and over, adult: Secondary | ICD-10-CM | POA: Insufficient documentation

## 2016-11-12 DIAGNOSIS — R269 Unspecified abnormalities of gait and mobility: Secondary | ICD-10-CM | POA: Diagnosis not present

## 2016-11-12 DIAGNOSIS — D649 Anemia, unspecified: Secondary | ICD-10-CM | POA: Diagnosis not present

## 2016-11-12 DIAGNOSIS — Z794 Long term (current) use of insulin: Secondary | ICD-10-CM | POA: Diagnosis not present

## 2016-11-12 DIAGNOSIS — E119 Type 2 diabetes mellitus without complications: Secondary | ICD-10-CM | POA: Diagnosis not present

## 2016-11-12 DIAGNOSIS — X58XXXA Exposure to other specified factors, initial encounter: Secondary | ICD-10-CM | POA: Insufficient documentation

## 2016-11-12 DIAGNOSIS — I10 Essential (primary) hypertension: Secondary | ICD-10-CM | POA: Insufficient documentation

## 2016-11-12 DIAGNOSIS — G4733 Obstructive sleep apnea (adult) (pediatric): Secondary | ICD-10-CM | POA: Diagnosis not present

## 2016-11-16 ENCOUNTER — Ambulatory Visit
Admission: RE | Admit: 2016-11-16 | Discharge: 2016-11-16 | Disposition: A | Discharge status: Home / Self Care | Payer: PPO | Source: Ambulatory Visit | Attending: Internal Medicine | Admitting: Internal Medicine

## 2016-11-16 DIAGNOSIS — S0990XA Unspecified injury of head, initial encounter: Secondary | ICD-10-CM | POA: Diagnosis not present

## 2016-11-16 DIAGNOSIS — R51 Headache: Secondary | ICD-10-CM | POA: Diagnosis not present

## 2016-11-16 DIAGNOSIS — G44301 Post-traumatic headache, unspecified, intractable: Secondary | ICD-10-CM

## 2016-11-18 DIAGNOSIS — S31109A Unspecified open wound of abdominal wall, unspecified quadrant without penetration into peritoneal cavity, initial encounter: Secondary | ICD-10-CM | POA: Diagnosis not present

## 2016-11-18 DIAGNOSIS — M5416 Radiculopathy, lumbar region: Secondary | ICD-10-CM | POA: Diagnosis not present

## 2016-11-19 DIAGNOSIS — T8131XA Disruption of external operation (surgical) wound, not elsewhere classified, initial encounter: Secondary | ICD-10-CM | POA: Diagnosis not present

## 2016-11-19 DIAGNOSIS — S31104A Unspecified open wound of abdominal wall, left lower quadrant without penetration into peritoneal cavity, initial encounter: Secondary | ICD-10-CM | POA: Diagnosis not present

## 2016-11-19 DIAGNOSIS — S31109A Unspecified open wound of abdominal wall, unspecified quadrant without penetration into peritoneal cavity, initial encounter: Secondary | ICD-10-CM | POA: Diagnosis not present

## 2016-11-25 ENCOUNTER — Encounter (HOSPITAL_BASED_OUTPATIENT_CLINIC_OR_DEPARTMENT_OTHER): Payer: PPO | Attending: Internal Medicine

## 2016-11-25 DIAGNOSIS — E11622 Type 2 diabetes mellitus with other skin ulcer: Secondary | ICD-10-CM | POA: Diagnosis not present

## 2016-11-25 DIAGNOSIS — L98492 Non-pressure chronic ulcer of skin of other sites with fat layer exposed: Secondary | ICD-10-CM | POA: Insufficient documentation

## 2016-11-25 DIAGNOSIS — I10 Essential (primary) hypertension: Secondary | ICD-10-CM | POA: Diagnosis not present

## 2016-11-25 DIAGNOSIS — M5416 Radiculopathy, lumbar region: Secondary | ICD-10-CM | POA: Diagnosis not present

## 2016-11-25 DIAGNOSIS — Z6841 Body Mass Index (BMI) 40.0 and over, adult: Secondary | ICD-10-CM | POA: Diagnosis not present

## 2016-11-25 DIAGNOSIS — L03311 Cellulitis of abdominal wall: Secondary | ICD-10-CM | POA: Diagnosis not present

## 2016-11-25 DIAGNOSIS — T8131XA Disruption of external operation (surgical) wound, not elsewhere classified, initial encounter: Secondary | ICD-10-CM | POA: Diagnosis not present

## 2016-11-26 DIAGNOSIS — E038 Other specified hypothyroidism: Secondary | ICD-10-CM | POA: Diagnosis not present

## 2016-11-30 DIAGNOSIS — M5416 Radiculopathy, lumbar region: Secondary | ICD-10-CM | POA: Diagnosis not present

## 2016-12-02 DIAGNOSIS — F418 Other specified anxiety disorders: Secondary | ICD-10-CM | POA: Diagnosis not present

## 2016-12-02 DIAGNOSIS — E0590 Thyrotoxicosis, unspecified without thyrotoxic crisis or storm: Secondary | ICD-10-CM | POA: Diagnosis not present

## 2016-12-02 DIAGNOSIS — G2581 Restless legs syndrome: Secondary | ICD-10-CM | POA: Diagnosis not present

## 2016-12-02 DIAGNOSIS — R4182 Altered mental status, unspecified: Secondary | ICD-10-CM | POA: Diagnosis not present

## 2016-12-02 DIAGNOSIS — Z6841 Body Mass Index (BMI) 40.0 and over, adult: Secondary | ICD-10-CM | POA: Diagnosis not present

## 2016-12-03 DIAGNOSIS — S31109A Unspecified open wound of abdominal wall, unspecified quadrant without penetration into peritoneal cavity, initial encounter: Secondary | ICD-10-CM | POA: Diagnosis not present

## 2016-12-06 DIAGNOSIS — T8131XA Disruption of external operation (surgical) wound, not elsewhere classified, initial encounter: Secondary | ICD-10-CM | POA: Diagnosis not present

## 2016-12-06 DIAGNOSIS — E11622 Type 2 diabetes mellitus with other skin ulcer: Secondary | ICD-10-CM | POA: Diagnosis not present

## 2016-12-13 ENCOUNTER — Other Ambulatory Visit (HOSPITAL_COMMUNITY)
Admission: RE | Admit: 2016-12-13 | Discharge: 2016-12-13 | Disposition: A | Discharge status: Home / Self Care | Payer: PPO | Source: Other Acute Inpatient Hospital | Attending: Internal Medicine | Admitting: Internal Medicine

## 2016-12-13 DIAGNOSIS — S31109A Unspecified open wound of abdominal wall, unspecified quadrant without penetration into peritoneal cavity, initial encounter: Secondary | ICD-10-CM | POA: Diagnosis not present

## 2016-12-13 DIAGNOSIS — E119 Type 2 diabetes mellitus without complications: Secondary | ICD-10-CM | POA: Diagnosis not present

## 2016-12-13 DIAGNOSIS — L03119 Cellulitis of unspecified part of limb: Secondary | ICD-10-CM | POA: Diagnosis not present

## 2016-12-13 DIAGNOSIS — X58XXXA Exposure to other specified factors, initial encounter: Secondary | ICD-10-CM | POA: Insufficient documentation

## 2016-12-13 DIAGNOSIS — T8189XA Other complications of procedures, not elsewhere classified, initial encounter: Secondary | ICD-10-CM | POA: Diagnosis not present

## 2016-12-13 DIAGNOSIS — R269 Unspecified abnormalities of gait and mobility: Secondary | ICD-10-CM | POA: Diagnosis not present

## 2016-12-13 DIAGNOSIS — G4733 Obstructive sleep apnea (adult) (pediatric): Secondary | ICD-10-CM | POA: Diagnosis not present

## 2016-12-13 DIAGNOSIS — E11622 Type 2 diabetes mellitus with other skin ulcer: Secondary | ICD-10-CM | POA: Diagnosis not present

## 2016-12-14 DIAGNOSIS — S31109A Unspecified open wound of abdominal wall, unspecified quadrant without penetration into peritoneal cavity, initial encounter: Secondary | ICD-10-CM | POA: Diagnosis not present

## 2016-12-15 ENCOUNTER — Other Ambulatory Visit (HOSPITAL_COMMUNITY): Payer: Self-pay | Admitting: Endocrinology

## 2016-12-15 ENCOUNTER — Other Ambulatory Visit (HOSPITAL_COMMUNITY): Payer: Self-pay | Admitting: Internal Medicine

## 2016-12-15 DIAGNOSIS — Z23 Encounter for immunization: Secondary | ICD-10-CM | POA: Diagnosis not present

## 2016-12-15 DIAGNOSIS — E0590 Thyrotoxicosis, unspecified without thyrotoxic crisis or storm: Secondary | ICD-10-CM | POA: Diagnosis not present

## 2016-12-15 DIAGNOSIS — F419 Anxiety disorder, unspecified: Secondary | ICD-10-CM | POA: Diagnosis not present

## 2016-12-15 DIAGNOSIS — R4182 Altered mental status, unspecified: Secondary | ICD-10-CM | POA: Diagnosis not present

## 2016-12-15 DIAGNOSIS — Z6841 Body Mass Index (BMI) 40.0 and over, adult: Secondary | ICD-10-CM | POA: Diagnosis not present

## 2016-12-15 DIAGNOSIS — R1310 Dysphagia, unspecified: Secondary | ICD-10-CM

## 2016-12-15 DIAGNOSIS — R2689 Other abnormalities of gait and mobility: Secondary | ICD-10-CM | POA: Diagnosis not present

## 2016-12-15 DIAGNOSIS — N183 Chronic kidney disease, stage 3 (moderate): Secondary | ICD-10-CM | POA: Diagnosis not present

## 2016-12-15 DIAGNOSIS — E1129 Type 2 diabetes mellitus with other diabetic kidney complication: Secondary | ICD-10-CM | POA: Diagnosis not present

## 2016-12-16 LAB — AEROBIC CULTURE  (SUPERFICIAL SPECIMEN)

## 2016-12-16 LAB — AEROBIC CULTURE W GRAM STAIN (SUPERFICIAL SPECIMEN)

## 2016-12-20 ENCOUNTER — Encounter (HOSPITAL_BASED_OUTPATIENT_CLINIC_OR_DEPARTMENT_OTHER): Payer: PPO | Attending: Internal Medicine

## 2016-12-20 DIAGNOSIS — Y838 Other surgical procedures as the cause of abnormal reaction of the patient, or of later complication, without mention of misadventure at the time of the procedure: Secondary | ICD-10-CM | POA: Diagnosis not present

## 2016-12-20 DIAGNOSIS — I890 Lymphedema, not elsewhere classified: Secondary | ICD-10-CM | POA: Diagnosis not present

## 2016-12-20 DIAGNOSIS — L03311 Cellulitis of abdominal wall: Secondary | ICD-10-CM | POA: Diagnosis not present

## 2016-12-20 DIAGNOSIS — E11622 Type 2 diabetes mellitus with other skin ulcer: Secondary | ICD-10-CM | POA: Diagnosis not present

## 2016-12-20 DIAGNOSIS — T8131XA Disruption of external operation (surgical) wound, not elsewhere classified, initial encounter: Secondary | ICD-10-CM | POA: Insufficient documentation

## 2016-12-20 DIAGNOSIS — T814XXA Infection following a procedure, initial encounter: Secondary | ICD-10-CM | POA: Diagnosis not present

## 2016-12-20 DIAGNOSIS — B965 Pseudomonas (aeruginosa) (mallei) (pseudomallei) as the cause of diseases classified elsewhere: Secondary | ICD-10-CM | POA: Insufficient documentation

## 2016-12-20 DIAGNOSIS — I10 Essential (primary) hypertension: Secondary | ICD-10-CM | POA: Insufficient documentation

## 2016-12-20 DIAGNOSIS — B952 Enterococcus as the cause of diseases classified elsewhere: Secondary | ICD-10-CM | POA: Diagnosis not present

## 2016-12-20 DIAGNOSIS — L98492 Non-pressure chronic ulcer of skin of other sites with fat layer exposed: Secondary | ICD-10-CM | POA: Insufficient documentation

## 2016-12-20 DIAGNOSIS — S31105S Unspecified open wound of abdominal wall, periumbilic region without penetration into peritoneal cavity, sequela: Secondary | ICD-10-CM | POA: Diagnosis not present

## 2016-12-23 ENCOUNTER — Ambulatory Visit (HOSPITAL_COMMUNITY)
Admission: RE | Admit: 2016-12-23 | Discharge: 2016-12-23 | Disposition: A | Discharge status: Home / Self Care | Payer: PPO | Source: Ambulatory Visit | Attending: Internal Medicine | Admitting: Internal Medicine

## 2016-12-23 ENCOUNTER — Ambulatory Visit (HOSPITAL_COMMUNITY)
Admission: RE | Admit: 2016-12-23 | Discharge: 2016-12-23 | Disposition: A | Discharge status: Home / Self Care | Payer: PPO | Source: Ambulatory Visit | Attending: Endocrinology | Admitting: Endocrinology

## 2016-12-23 DIAGNOSIS — R1310 Dysphagia, unspecified: Secondary | ICD-10-CM

## 2016-12-23 DIAGNOSIS — E042 Nontoxic multinodular goiter: Secondary | ICD-10-CM | POA: Diagnosis not present

## 2016-12-23 DIAGNOSIS — E0590 Thyrotoxicosis, unspecified without thyrotoxic crisis or storm: Secondary | ICD-10-CM

## 2016-12-23 DIAGNOSIS — E11622 Type 2 diabetes mellitus with other skin ulcer: Secondary | ICD-10-CM | POA: Diagnosis not present

## 2016-12-23 DIAGNOSIS — E039 Hypothyroidism, unspecified: Secondary | ICD-10-CM | POA: Insufficient documentation

## 2016-12-27 DIAGNOSIS — T8131XA Disruption of external operation (surgical) wound, not elsewhere classified, initial encounter: Secondary | ICD-10-CM | POA: Diagnosis not present

## 2016-12-27 DIAGNOSIS — E11622 Type 2 diabetes mellitus with other skin ulcer: Secondary | ICD-10-CM | POA: Diagnosis not present

## 2016-12-27 DIAGNOSIS — S31109A Unspecified open wound of abdominal wall, unspecified quadrant without penetration into peritoneal cavity, initial encounter: Secondary | ICD-10-CM | POA: Diagnosis not present

## 2016-12-28 DIAGNOSIS — E11622 Type 2 diabetes mellitus with other skin ulcer: Secondary | ICD-10-CM | POA: Diagnosis not present

## 2016-12-28 DIAGNOSIS — S31105S Unspecified open wound of abdominal wall, periumbilic region without penetration into peritoneal cavity, sequela: Secondary | ICD-10-CM | POA: Diagnosis not present

## 2016-12-29 DIAGNOSIS — Z6841 Body Mass Index (BMI) 40.0 and over, adult: Secondary | ICD-10-CM | POA: Diagnosis not present

## 2016-12-29 DIAGNOSIS — M25511 Pain in right shoulder: Secondary | ICD-10-CM | POA: Diagnosis not present

## 2016-12-29 DIAGNOSIS — E0590 Thyrotoxicosis, unspecified without thyrotoxic crisis or storm: Secondary | ICD-10-CM | POA: Diagnosis not present

## 2016-12-29 DIAGNOSIS — E1129 Type 2 diabetes mellitus with other diabetic kidney complication: Secondary | ICD-10-CM | POA: Diagnosis not present

## 2016-12-29 DIAGNOSIS — R2689 Other abnormalities of gait and mobility: Secondary | ICD-10-CM | POA: Diagnosis not present

## 2016-12-29 DIAGNOSIS — F338 Other recurrent depressive disorders: Secondary | ICD-10-CM | POA: Diagnosis not present

## 2016-12-30 DIAGNOSIS — S31109A Unspecified open wound of abdominal wall, unspecified quadrant without penetration into peritoneal cavity, initial encounter: Secondary | ICD-10-CM | POA: Diagnosis not present

## 2017-01-03 DIAGNOSIS — E11622 Type 2 diabetes mellitus with other skin ulcer: Secondary | ICD-10-CM | POA: Diagnosis not present

## 2017-01-03 DIAGNOSIS — T8131XA Disruption of external operation (surgical) wound, not elsewhere classified, initial encounter: Secondary | ICD-10-CM | POA: Diagnosis not present

## 2017-01-05 ENCOUNTER — Other Ambulatory Visit: Payer: Self-pay | Admitting: Orthopedic Surgery

## 2017-01-05 DIAGNOSIS — M25311 Other instability, right shoulder: Secondary | ICD-10-CM | POA: Diagnosis not present

## 2017-01-05 DIAGNOSIS — M25512 Pain in left shoulder: Secondary | ICD-10-CM | POA: Diagnosis not present

## 2017-01-06 ENCOUNTER — Emergency Department (HOSPITAL_COMMUNITY)
Admission: EM | Admit: 2017-01-06 | Discharge: 2017-01-06 | Disposition: A | Discharge status: Home / Self Care | Payer: PPO | Attending: Emergency Medicine | Admitting: Emergency Medicine

## 2017-01-06 ENCOUNTER — Emergency Department (HOSPITAL_COMMUNITY): Payer: PPO

## 2017-01-06 DIAGNOSIS — J45909 Unspecified asthma, uncomplicated: Secondary | ICD-10-CM | POA: Diagnosis not present

## 2017-01-06 DIAGNOSIS — T148XXA Other injury of unspecified body region, initial encounter: Secondary | ICD-10-CM

## 2017-01-06 DIAGNOSIS — S31101D Unspecified open wound of abdominal wall, left upper quadrant without penetration into peritoneal cavity, subsequent encounter: Secondary | ICD-10-CM | POA: Insufficient documentation

## 2017-01-06 DIAGNOSIS — E039 Hypothyroidism, unspecified: Secondary | ICD-10-CM | POA: Diagnosis not present

## 2017-01-06 DIAGNOSIS — T8149XA Infection following a procedure, other surgical site, initial encounter: Secondary | ICD-10-CM | POA: Diagnosis not present

## 2017-01-06 DIAGNOSIS — E119 Type 2 diabetes mellitus without complications: Secondary | ICD-10-CM | POA: Insufficient documentation

## 2017-01-06 DIAGNOSIS — R0602 Shortness of breath: Secondary | ICD-10-CM | POA: Insufficient documentation

## 2017-01-06 DIAGNOSIS — J189 Pneumonia, unspecified organism: Secondary | ICD-10-CM | POA: Diagnosis not present

## 2017-01-06 DIAGNOSIS — Z794 Long term (current) use of insulin: Secondary | ICD-10-CM | POA: Insufficient documentation

## 2017-01-06 DIAGNOSIS — Z96651 Presence of right artificial knee joint: Secondary | ICD-10-CM | POA: Diagnosis not present

## 2017-01-06 DIAGNOSIS — L089 Local infection of the skin and subcutaneous tissue, unspecified: Secondary | ICD-10-CM | POA: Diagnosis not present

## 2017-01-06 DIAGNOSIS — Z87440 Personal history of urinary (tract) infections: Secondary | ICD-10-CM | POA: Insufficient documentation

## 2017-01-06 DIAGNOSIS — I10 Essential (primary) hypertension: Secondary | ICD-10-CM | POA: Diagnosis not present

## 2017-01-06 DIAGNOSIS — X58XXXA Exposure to other specified factors, initial encounter: Secondary | ICD-10-CM | POA: Diagnosis not present

## 2017-01-06 LAB — CBC WITH DIFFERENTIAL/PLATELET
Basophils Absolute: 0 10*3/uL (ref 0.0–0.1)
Basophils Relative: 0 %
Eosinophils Absolute: 0 10*3/uL (ref 0.0–0.7)
Eosinophils Relative: 0 %
HCT: 28 % — ABNORMAL LOW (ref 36.0–46.0)
Hemoglobin: 9.2 g/dL — ABNORMAL LOW (ref 12.0–15.0)
Lymphocytes Relative: 20 %
Lymphs Abs: 0.8 10*3/uL (ref 0.7–4.0)
MCH: 27.3 pg (ref 26.0–34.0)
MCHC: 32.9 g/dL (ref 30.0–36.0)
MCV: 83.1 fL (ref 78.0–100.0)
Monocytes Absolute: 0.4 10*3/uL (ref 0.1–1.0)
Monocytes Relative: 9 %
Neutro Abs: 2.9 10*3/uL (ref 1.7–7.7)
Neutrophils Relative %: 71 %
Platelets: 257 10*3/uL (ref 150–400)
RBC: 3.37 MIL/uL — ABNORMAL LOW (ref 3.87–5.11)
RDW: 15 % (ref 11.5–15.5)
WBC: 4.2 10*3/uL (ref 4.0–10.5)

## 2017-01-06 LAB — COMPREHENSIVE METABOLIC PANEL
ALT: 16 U/L (ref 14–54)
AST: 21 U/L (ref 15–41)
Albumin: 2.9 g/dL — ABNORMAL LOW (ref 3.5–5.0)
Alkaline Phosphatase: 82 U/L (ref 38–126)
Anion gap: 11 (ref 5–15)
BUN: 11 mg/dL (ref 6–20)
CO2: 27 mmol/L (ref 22–32)
Calcium: 9.3 mg/dL (ref 8.9–10.3)
Chloride: 99 mmol/L — ABNORMAL LOW (ref 101–111)
Creatinine, Ser: 0.86 mg/dL (ref 0.44–1.00)
GFR calc Af Amer: >60 mL/min (ref >60)
GFR calc non Af Amer: >60 mL/min (ref >60)
Glucose, Bld: 194 mg/dL — ABNORMAL HIGH (ref 65–99)
Potassium: 3.3 mmol/L — ABNORMAL LOW (ref 3.5–5.1)
Sodium: 137 mmol/L (ref 135–145)
Total Bilirubin: 0.7 mg/dL (ref 0.3–1.2)
Total Protein: 6.5 g/dL (ref 6.5–8.1)

## 2017-01-06 LAB — URINALYSIS, ROUTINE W REFLEX MICROSCOPIC
Bilirubin Urine: NEGATIVE
Glucose, UA: 50 mg/dL — AB
Hgb urine dipstick: NEGATIVE
Ketones, ur: NEGATIVE mg/dL
Leukocytes, UA: NEGATIVE
Nitrite: NEGATIVE
Protein, ur: NEGATIVE mg/dL
Specific Gravity, Urine: 1.006 (ref 1.005–1.030)
pH: 5 (ref 5.0–8.0)

## 2017-01-06 LAB — I-STAT CG4 LACTIC ACID, ED: Lactic Acid, Venous: 1.13 mmol/L (ref 0.5–1.9)

## 2017-01-06 MED ORDER — FENTANYL CITRATE (PF) 100 MCG/2ML IJ SOLN
25.0000 ug | Freq: Once | INTRAMUSCULAR | Status: AC
  Administered 2017-01-06: 25 ug via INTRAVENOUS
  Filled 2017-01-06: qty 2

## 2017-01-06 MED ORDER — TRAMADOL HCL 50 MG PO TABS: 50.0000 mg | ORAL_TABLET | Freq: Four times a day (QID) | ORAL | 0 refills | Status: DC | PRN

## 2017-01-06 MED ORDER — DOXYCYCLINE HYCLATE 100 MG PO CAPS: 100.0000 mg | ORAL_CAPSULE | Freq: Two times a day (BID) | ORAL | 0 refills | Status: DC

## 2017-01-06 MED ORDER — IOPAMIDOL (ISOVUE-370) INJECTION 76%
INTRAVENOUS | Status: AC
  Administered 2017-01-06: 100 mL
  Filled 2017-01-06: qty 100

## 2017-01-06 NOTE — ED Provider Notes (Signed)
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 [phone]

## 2017-01-06 NOTE — ED Notes (Signed)
35mcg of fentanyl wasted with Mickle Plumb

## 2017-01-06 NOTE — ED Triage Notes (Signed)
Pt reports nonhealing abdominal wound since July. States last night noticed a fowl odor and concerned for infection. Denies recent fever. Pt awake, alert, appropriate at present. VSS.

## 2017-01-06 NOTE — ED Notes (Signed)
Patient given discharge instructions and verbalized understanding.  Patient stable to discharge at this time.  Patient is alert and oriented to baseline.  No distressed noted at this time.  All belongings taken with the patient at discharge.   

## 2017-01-06 NOTE — Discharge Instructions (Signed)
It was my pleasure taking care of you today!  It is very important that you follow up with your primary care provider. The CT of your chest had some abnormalities that your doctor needs to know about and evaluate further.   Please take all of your antibiotics until finished!  Tramadol as needed for pain.   Return to ER for fever, trouble breathing, new or worsening symptoms, any additional concerns.

## 2017-01-08 ENCOUNTER — Encounter (HOSPITAL_COMMUNITY): Payer: Self-pay

## 2017-01-08 ENCOUNTER — Emergency Department (HOSPITAL_COMMUNITY)
Admission: EM | Admit: 2017-01-08 | Discharge: 2017-01-08 | Disposition: A | Discharge status: Home / Self Care | Payer: PPO | Attending: Emergency Medicine | Admitting: Emergency Medicine

## 2017-01-08 ENCOUNTER — Other Ambulatory Visit: Payer: Self-pay

## 2017-01-08 ENCOUNTER — Emergency Department (HOSPITAL_COMMUNITY): Payer: PPO

## 2017-01-08 DIAGNOSIS — E039 Hypothyroidism, unspecified: Secondary | ICD-10-CM | POA: Insufficient documentation

## 2017-01-08 DIAGNOSIS — Z79899 Other long term (current) drug therapy: Secondary | ICD-10-CM | POA: Diagnosis not present

## 2017-01-08 DIAGNOSIS — Y939 Activity, unspecified: Secondary | ICD-10-CM | POA: Diagnosis not present

## 2017-01-08 DIAGNOSIS — W19XXXA Unspecified fall, initial encounter: Secondary | ICD-10-CM

## 2017-01-08 DIAGNOSIS — I10 Essential (primary) hypertension: Secondary | ICD-10-CM | POA: Diagnosis not present

## 2017-01-08 DIAGNOSIS — M24411 Recurrent dislocation, right shoulder: Secondary | ICD-10-CM

## 2017-01-08 DIAGNOSIS — W050XXA Fall from non-moving wheelchair, initial encounter: Secondary | ICD-10-CM | POA: Diagnosis not present

## 2017-01-08 DIAGNOSIS — E119 Type 2 diabetes mellitus without complications: Secondary | ICD-10-CM | POA: Insufficient documentation

## 2017-01-08 DIAGNOSIS — J45901 Unspecified asthma with (acute) exacerbation: Secondary | ICD-10-CM | POA: Insufficient documentation

## 2017-01-08 DIAGNOSIS — Y999 Unspecified external cause status: Secondary | ICD-10-CM | POA: Insufficient documentation

## 2017-01-08 DIAGNOSIS — R0602 Shortness of breath: Secondary | ICD-10-CM | POA: Insufficient documentation

## 2017-01-08 DIAGNOSIS — Y929 Unspecified place or not applicable: Secondary | ICD-10-CM | POA: Insufficient documentation

## 2017-01-08 DIAGNOSIS — L03311 Cellulitis of abdominal wall: Secondary | ICD-10-CM | POA: Insufficient documentation

## 2017-01-08 DIAGNOSIS — M79601 Pain in right arm: Secondary | ICD-10-CM | POA: Diagnosis not present

## 2017-01-08 DIAGNOSIS — Z96651 Presence of right artificial knee joint: Secondary | ICD-10-CM | POA: Insufficient documentation

## 2017-01-08 DIAGNOSIS — T148XXA Other injury of unspecified body region, initial encounter: Secondary | ICD-10-CM | POA: Diagnosis not present

## 2017-01-08 DIAGNOSIS — Z794 Long term (current) use of insulin: Secondary | ICD-10-CM | POA: Insufficient documentation

## 2017-01-08 DIAGNOSIS — S299XXA Unspecified injury of thorax, initial encounter: Secondary | ICD-10-CM | POA: Diagnosis not present

## 2017-01-08 DIAGNOSIS — S4991XA Unspecified injury of right shoulder and upper arm, initial encounter: Secondary | ICD-10-CM | POA: Diagnosis not present

## 2017-01-08 LAB — CBC WITH DIFFERENTIAL/PLATELET
Basophils Absolute: 0 10*3/uL (ref 0.0–0.1)
Basophils Relative: 0 %
Eosinophils Absolute: 0 10*3/uL (ref 0.0–0.7)
Eosinophils Relative: 0 %
HCT: 25.7 % — ABNORMAL LOW (ref 36.0–46.0)
Hemoglobin: 8.6 g/dL — ABNORMAL LOW (ref 12.0–15.0)
Lymphocytes Relative: 21 %
Lymphs Abs: 0.9 10*3/uL (ref 0.7–4.0)
MCH: 27.5 pg (ref 26.0–34.0)
MCHC: 33.5 g/dL (ref 30.0–36.0)
MCV: 82.1 fL (ref 78.0–100.0)
Monocytes Absolute: 0.5 10*3/uL (ref 0.1–1.0)
Monocytes Relative: 12 %
Neutro Abs: 2.9 10*3/uL (ref 1.7–7.7)
Neutrophils Relative %: 67 %
Platelets: 266 10*3/uL (ref 150–400)
RBC: 3.13 MIL/uL — ABNORMAL LOW (ref 3.87–5.11)
RDW: 15 % (ref 11.5–15.5)
WBC: 4.3 10*3/uL (ref 4.0–10.5)

## 2017-01-08 LAB — TROPONIN I: Troponin I: <0.03 ng/mL (ref <0.03)

## 2017-01-08 LAB — BASIC METABOLIC PANEL
Anion gap: 11 (ref 5–15)
BUN: 12 mg/dL (ref 6–20)
CO2: 28 mmol/L (ref 22–32)
Calcium: 9.1 mg/dL (ref 8.9–10.3)
Chloride: 99 mmol/L — ABNORMAL LOW (ref 101–111)
Creatinine, Ser: 0.79 mg/dL (ref 0.44–1.00)
GFR calc Af Amer: >60 mL/min (ref >60)
GFR calc non Af Amer: >60 mL/min (ref >60)
Glucose, Bld: 208 mg/dL — ABNORMAL HIGH (ref 65–99)
Potassium: 3.2 mmol/L — ABNORMAL LOW (ref 3.5–5.1)
Sodium: 138 mmol/L (ref 135–145)

## 2017-01-08 LAB — BRAIN NATRIURETIC PEPTIDE: B Natriuretic Peptide: 315.2 pg/mL — ABNORMAL HIGH (ref 0.0–100.0)

## 2017-01-08 MED ORDER — FUROSEMIDE 10 MG/ML IJ SOLN
40.0000 mg | Freq: Once | INTRAMUSCULAR | Status: AC
  Administered 2017-01-08: 40 mg via INTRAVENOUS
  Filled 2017-01-08: qty 4

## 2017-01-08 MED ORDER — ONDANSETRON HCL 4 MG/2ML IJ SOLN
4.0000 mg | Freq: Once | INTRAMUSCULAR | Status: AC
  Administered 2017-01-08: 4 mg via INTRAVENOUS
  Filled 2017-01-08: qty 2

## 2017-01-08 MED ORDER — ONDANSETRON 4 MG PO TBDP: ORAL_TABLET | ORAL | 0 refills | Status: DC

## 2017-01-08 MED ORDER — FUROSEMIDE 40 MG PO TABS: 40.0000 mg | ORAL_TABLET | Freq: Two times a day (BID) | ORAL | 0 refills | Status: DC

## 2017-01-08 MED ORDER — HYDROMORPHONE HCL 1 MG/ML IJ SOLN
1.0000 mg | Freq: Once | INTRAMUSCULAR | Status: AC
  Administered 2017-01-08: 1 mg via INTRAVENOUS
  Filled 2017-01-08: qty 1

## 2017-01-08 MED ORDER — PROPOFOL 10 MG/ML IV BOLUS
200.0000 mg | Freq: Once | INTRAVENOUS | Status: AC
  Administered 2017-01-08: 200 mg via INTRAVENOUS
  Filled 2017-01-08: qty 20

## 2017-01-08 MED ORDER — OXYCODONE-ACETAMINOPHEN 5-325 MG PO TABS: 1.0000 | ORAL_TABLET | Freq: Four times a day (QID) | ORAL | 0 refills | Status: DC | PRN

## 2017-01-08 MED ORDER — LIDOCAINE HCL 2 % IJ SOLN
10.0000 mL | Freq: Once | INTRAMUSCULAR | Status: AC
  Administered 2017-01-08: 200 mg

## 2017-01-08 MED ORDER — LIDOCAINE HCL (PF) 2 % IJ SOLN
INTRAMUSCULAR | Status: AC
  Administered 2017-01-08: 200 mg
  Filled 2017-01-08: qty 10

## 2017-01-08 MED ORDER — SODIUM CHLORIDE 0.9 % IV SOLN
INTRAVENOUS | Status: AC | PRN
  Administered 2017-01-08: 150 mL/h via INTRAVENOUS

## 2017-01-08 NOTE — Sedation Documentation (Signed)
2nd Dose of 20mg  of propofol was given at 1637  3rd Dose of 30mg  of profofol was given at 1638  4th Dose of 50mg  of propofol was given at 1640.  X-ray confirmed @ 1642.

## 2017-01-08 NOTE — ED Provider Notes (Signed)
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     Site/side marked: yes     Immediately prior to procedure a time out was called: yes     Patient identity confirmation method:  Verbally with patient Indications:    Procedure necessitating sedation performed by:  Physician performing sedation   Intended level of sedation:  Deep Pre-sedation assessment:    Time since last food or drink:  6 hrs   ASA classification: class 1 - normal, healthy patient     Neck mobility: normal     Mouth opening:  3 or more finger widths   Thyromental distance:  4 finger widths   Mallampati score:  I - soft palate, uvula, fauces, pillars visible   Pre-sedation assessments completed and reviewed: airway patency, cardiovascular function, hydration status, mental status, nausea/vomiting, pain level, respiratory function  and temperature   Immediate pre-procedure details:    Reassessment: Patient reassessed immediately prior to procedure     Reviewed: vital signs, relevant labs/tests and NPO status     Verified: bag valve mask available, emergency equipment available, intubation equipment available, IV patency confirmed, oxygen available and suction available   Procedure details (see MAR for exact dosages):    Preoxygenation:  Nasal cannula   Sedation:  Propofol   Intra-procedure monitoring:  Blood pressure monitoring, cardiac monitor, continuous pulse oximetry, frequent LOC assessments, frequent vital sign checks and continuous capnometry   Intra-procedure events: none     Total Provider sedation time (minutes):  30 Post-procedure details:    Attendance: Constant attendance by certified staff until patient recovered     Recovery: Patient returned to pre-procedure baseline     Post-sedation assessments completed and reviewed: airway patency, cardiovascular function, hydration status, mental status, nausea/vomiting, pain level, respiratory function and temperature     Patient is stable for discharge or admission: yes     Patient tolerance:  Tolerated well, no immediate complications Reduction of dislocation Date/Time: 01/08/2017 5:16 PM Performed by: Drenda Freeze Authorized by: Drenda Freeze  Consent: Verbal consent obtained. Risks and benefits: risks, benefits and alternatives were discussed Consent given by: patient Patient understanding: patient states understanding of the procedure being performed Patient consent: the patient's understanding of the procedure matches consent given Procedure consent: procedure consent matches procedure scheduled Relevant documents: relevant documents present and verified Test results: test results available and properly labeled Required items: required blood products, implants, devices, and special equipment available Patient identity confirmed: verbally with  patient Time out: Immediately prior to procedure a "time out" was called to verify the correct patient, procedure, equipment, support staff and site/side marked as required. Local anesthesia used: yes Anesthesia: hematoma block  Anesthesia: Local anesthesia used: yes Local Anesthetic: lidocaine 2% without epinephrine  Sedation: Patient sedated: yes Sedation type: moderate (conscious) sedation Vitals: Vital signs were monitored during sedation. Patient tolerance: Patient tolerated the procedure well with no immediate complications    (including critical care time)     Medications Ordered in ED Medications  HYDROmorphone (DILAUDID) injection 1 mg (1 mg Intravenous Given 01/08/17 1224)  ondansetron (ZOFRAN) injection 4 mg (4 mg Intravenous Given 01/08/17 1224)  furosemide (LASIX) injection 40 mg (40 mg Intravenous Given 01/08/17 1431)  HYDROmorphone (DILAUDID) injection 1 mg (1 mg Intravenous Given 01/08/17 1431)  HYDROmorphone (DILAUDID) injection 1 mg (1 mg Intravenous Given 01/08/17 1543)  ondansetron (ZOFRAN) injection 4 mg (4 mg Intravenous Given 01/08/17 1543)  lidocaine (XYLOCAINE) 2 % (with pres) injection 200 mg (200 mg Infiltration Given 01/08/17 1550)  propofol (DIPRIVAN) 10 mg/mL bolus/IV push 200 mg (200 mg Intravenous Given 01/08/17 1636)  0.9 %  sodium chloride infusion ( Intravenous Stopped 01/08/17 1708)     Initial Impression / Assessment and Plan / ED Course  I have reviewed the triage vital signs and the nursing notes.  Pertinent labs & imaging results that were available during my care of the patient were reviewed by me and considered in my medical decision making (see chart for details).     PSALMS OLARTE is a 66 y.o. female here with fall. Has chronic R shoulder dislocation and scheduled for arthroplasty next week. Has new fall and more swelling and  pain there so consider proximal humerus fracture vs worsening dislocation. Has abdominal wound and is  already on doxycycline and had CT 2 days ago that showed no intra abdominal abscesses and blood cultures negative to date. Will check preop labs, get xrays R humerus, shoulder.    2 pm Xray showed stable dislocation. I called Dr. Bettina Gavia PA, who reviewed images. She had reduction attempt in the office several weeks ago and was thought to have unstable shoulder and needs arthroplasty anyway. I asked him about surgical schedule and Dr. Tamera Punt felt that since patient is on abx for her abdominal wound, will post pone surgery for now. Of note, patient has worsening pulmonary edema on CXR. BNP 300, Trop neg, denies chest pain. She has no previous echo in our system and O2 > 95% (she states that when she lays down it drops to 80s at baseline and she wears CPAP at home). Will increase her lasix for several days. Since there is no surgical plan, I don't think she needs to be admitted to get an echo and can get it outpatient.   4 pm She is in severe pain despite multiple doses of dilaudid. I will attempt to reduce the dislocation for comfort. I told patient and husband that the shoulder may not be reduced back to position and she may still need surgery. She understands risks and benefits of conscious sedation.   5:17 PM Sedation performed and post reduction xrays showed anatomic position now. Shoulder immobilizer placed. She will follow up with Dr. Tamera Punt. Will increase to lasix 40 mg BID x 3 days, give percocet for pain. Recommend echo outpatient before surgery. Gave strict return precautions.    Final Clinical Impressions(s) / ED Diagnoses   Final diagnoses:  Shoulder dislocation, recurrent, right  Fall, initial encounter  Abdominal wall cellulitis    New Prescriptions New Prescriptions   FUROSEMIDE (LASIX) 40 MG TABLET    Take 1 tablet (40 mg total) by mouth 2 (two) times daily.   ONDANSETRON (ZOFRAN ODT) 4 MG DISINTEGRATING TABLET    4mg  ODT q6 hours prn nausea/vomit    OXYCODONE-ACETAMINOPHEN (PERCOCET) 5-325 MG TABLET    Take 1-2 tablets by mouth every 6 (six) hours as needed.     Drenda Freeze, MD 01/08/17 [phone]

## 2017-01-08 NOTE — ED Notes (Signed)
Bed: YM41 Expected date:  Expected time:  Means of arrival:  Comments: Shoulder injury

## 2017-01-08 NOTE — Care Management Note (Signed)
Case Management Note  Patient Details  Name: [name] MRN: [id number] Date of Birth: 1950-08-02  Subjective/Objective:   Abdominal wound infection, fall                 Action/Plan: Discharge Planning: NCM spoke to pt at bedside. Offered choice for HH/list provided. Pt states she had [facility] in the past. Wanted pt NCM to speak to husband, Ray. Spoke to husband via phone states she has RW, lift chair, and bedside commode at home. He is agreeable to [facility]. Explained she will also have [facility]. Husband is hoping she will be able to manage at home with [facility]. He is able to be at major part of day but not 24 hour assistance.   PCP Crist Infante MD  Expected Discharge Date:                  Expected Discharge Plan:  [place]  In-House Referral:  NA  Discharge planning Services  CM Consult  Post Acute Care Choice:  Home Health Choice offered to:  Patient  DME Arranged:  N/A DME Agency:  NA  HH Arranged:  RN, PT, OT, Nurse's Aide, Social Work [employer] Agency:  [place]  Status of Service:  Completed, signed off  If discussed at [employer], dates discussed:    Additional Comments:  Erenest Rasher, RN 01/08/2017, 4:39 PM

## 2017-01-08 NOTE — ED Triage Notes (Signed)
She reports "I slid out of my chair" yesterday. She states she has a previous right shoulder dislocation with an attempted closed reduction by Dr. Tamera Punt, which was unsuccessful.

## 2017-01-08 NOTE — Discharge Instructions (Signed)
Keep sling in place until you see Dr. Tamera Punt.   Take percocet with zofran for severe pain.   You may have some fluid in your lungs. Increase lasix to 40 mg twice daily for 3 days then back down to your usual dose.   You may need echo of your heart before your surgery. You can arrange it with your doctor or cardiologist   Continue your antibiotics.   See your primary care doctor  Return to ER if you have worse shortness of breath, severe shoulder pain, chest pain, worse drainage from the abdominal wound.

## 2017-01-10 ENCOUNTER — Other Ambulatory Visit (HOSPITAL_COMMUNITY)
Admission: RE | Admit: 2017-01-10 | Discharge: 2017-01-10 | Disposition: A | Discharge status: Home / Self Care | Payer: PPO | Source: Other Acute Inpatient Hospital | Attending: Internal Medicine | Admitting: Internal Medicine

## 2017-01-10 DIAGNOSIS — L03311 Cellulitis of abdominal wall: Secondary | ICD-10-CM | POA: Diagnosis not present

## 2017-01-10 DIAGNOSIS — S31109A Unspecified open wound of abdominal wall, unspecified quadrant without penetration into peritoneal cavity, initial encounter: Secondary | ICD-10-CM | POA: Diagnosis not present

## 2017-01-10 DIAGNOSIS — E11622 Type 2 diabetes mellitus with other skin ulcer: Secondary | ICD-10-CM | POA: Insufficient documentation

## 2017-01-10 DIAGNOSIS — X58XXXS Exposure to other specified factors, sequela: Secondary | ICD-10-CM | POA: Diagnosis not present

## 2017-01-10 DIAGNOSIS — S31105S Unspecified open wound of abdominal wall, periumbilic region without penetration into peritoneal cavity, sequela: Secondary | ICD-10-CM | POA: Diagnosis not present

## 2017-01-10 DIAGNOSIS — T8131XA Disruption of external operation (surgical) wound, not elsewhere classified, initial encounter: Secondary | ICD-10-CM | POA: Diagnosis not present

## 2017-01-10 LAB — AEROBIC CULTURE W GRAM STAIN (SUPERFICIAL SPECIMEN)

## 2017-01-11 ENCOUNTER — Telehealth: Payer: Self-pay | Admitting: *Deleted

## 2017-01-11 DIAGNOSIS — J984 Other disorders of lung: Secondary | ICD-10-CM | POA: Diagnosis not present

## 2017-01-11 DIAGNOSIS — E119 Type 2 diabetes mellitus without complications: Secondary | ICD-10-CM | POA: Diagnosis not present

## 2017-01-11 DIAGNOSIS — Z87440 Personal history of urinary (tract) infections: Secondary | ICD-10-CM | POA: Diagnosis not present

## 2017-01-11 DIAGNOSIS — G510 Bell's palsy: Secondary | ICD-10-CM | POA: Diagnosis not present

## 2017-01-11 DIAGNOSIS — R911 Solitary pulmonary nodule: Secondary | ICD-10-CM | POA: Diagnosis not present

## 2017-01-11 DIAGNOSIS — J45909 Unspecified asthma, uncomplicated: Secondary | ICD-10-CM | POA: Diagnosis not present

## 2017-01-11 DIAGNOSIS — Z794 Long term (current) use of insulin: Secondary | ICD-10-CM | POA: Diagnosis not present

## 2017-01-11 DIAGNOSIS — D649 Anemia, unspecified: Secondary | ICD-10-CM | POA: Diagnosis not present

## 2017-01-11 DIAGNOSIS — T8141XD Infection following a procedure, superficial incisional surgical site, subsequent encounter: Secondary | ICD-10-CM | POA: Diagnosis not present

## 2017-01-11 DIAGNOSIS — E785 Hyperlipidemia, unspecified: Secondary | ICD-10-CM | POA: Diagnosis not present

## 2017-01-11 DIAGNOSIS — Z7982 Long term (current) use of aspirin: Secondary | ICD-10-CM | POA: Diagnosis not present

## 2017-01-11 DIAGNOSIS — M797 Fibromyalgia: Secondary | ICD-10-CM | POA: Diagnosis not present

## 2017-01-11 DIAGNOSIS — Z7951 Long term (current) use of inhaled steroids: Secondary | ICD-10-CM | POA: Diagnosis not present

## 2017-01-11 DIAGNOSIS — J439 Emphysema, unspecified: Secondary | ICD-10-CM | POA: Diagnosis not present

## 2017-01-11 DIAGNOSIS — I10 Essential (primary) hypertension: Secondary | ICD-10-CM | POA: Diagnosis not present

## 2017-01-11 DIAGNOSIS — G4733 Obstructive sleep apnea (adult) (pediatric): Secondary | ICD-10-CM | POA: Diagnosis not present

## 2017-01-11 LAB — CULTURE, BLOOD (ROUTINE X 2)
Culture: NO GROWTH
Culture: NO GROWTH
Special Requests: ADEQUATE
Special Requests: ADEQUATE

## 2017-01-11 NOTE — Telephone Encounter (Signed)
Post ED Visit - Positive Culture Follow-up: Successful Patient Follow-Up  Culture assessed and recommendations reviewed by: []  Elenor Quinones, Pharm.D. []  Heide Guile, Pharm.D., BCPS AQ-ID []  Parks Neptune, Pharm.D., BCPS [x]  Alycia Rossetti, Pharm.D., BCPS []  [place], Pharm.D., BCPS, AAHIVP []  Legrand Como, Pharm.D., BCPS, AAHIVP []  Salome Arnt, PharmD, BCPS []  Dimitri Ped, PharmD, BCPS []  Vincenza Hews, PharmD, BCPS  Positive  culture  []  Patient discharged without antimicrobial prescription and treatment is now indicated [x]  Organism is resistant to prescribed ED discharge antimicrobial []  Patient with positive blood cultures  Changes discussed with ED provider: Langston Masker, PA-C New antibiotic prescription Cipro 750 mg PO BID x 10 days Called to [employer], [place], [phone] Results faxed to [facility] Dr. Dellia Nims  Contacted patient, date 01/11/2017, time 1030   [name] 01/11/2017, 10:53 AM

## 2017-01-12 DIAGNOSIS — G4733 Obstructive sleep apnea (adult) (pediatric): Secondary | ICD-10-CM | POA: Diagnosis not present

## 2017-01-12 DIAGNOSIS — R269 Unspecified abnormalities of gait and mobility: Secondary | ICD-10-CM | POA: Diagnosis not present

## 2017-01-12 DIAGNOSIS — L03119 Cellulitis of unspecified part of limb: Secondary | ICD-10-CM | POA: Diagnosis not present

## 2017-01-12 DIAGNOSIS — E119 Type 2 diabetes mellitus without complications: Secondary | ICD-10-CM | POA: Diagnosis not present

## 2017-01-13 ENCOUNTER — Inpatient Hospital Stay: Admit: 2017-01-13 | Payer: PPO | Admitting: Orthopedic Surgery

## 2017-01-13 DIAGNOSIS — T8141XD Infection following a procedure, superficial incisional surgical site, subsequent encounter: Secondary | ICD-10-CM | POA: Diagnosis not present

## 2017-01-13 DIAGNOSIS — D649 Anemia, unspecified: Secondary | ICD-10-CM | POA: Diagnosis not present

## 2017-01-13 DIAGNOSIS — G4733 Obstructive sleep apnea (adult) (pediatric): Secondary | ICD-10-CM | POA: Diagnosis not present

## 2017-01-13 DIAGNOSIS — R911 Solitary pulmonary nodule: Secondary | ICD-10-CM | POA: Diagnosis not present

## 2017-01-13 DIAGNOSIS — I10 Essential (primary) hypertension: Secondary | ICD-10-CM | POA: Diagnosis not present

## 2017-01-13 DIAGNOSIS — E0500 Thyrotoxicosis with diffuse goiter without thyrotoxic crisis or storm: Secondary | ICD-10-CM | POA: Diagnosis not present

## 2017-01-13 DIAGNOSIS — G510 Bell's palsy: Secondary | ICD-10-CM | POA: Diagnosis not present

## 2017-01-13 DIAGNOSIS — Z7982 Long term (current) use of aspirin: Secondary | ICD-10-CM | POA: Diagnosis not present

## 2017-01-13 DIAGNOSIS — Z794 Long term (current) use of insulin: Secondary | ICD-10-CM | POA: Diagnosis not present

## 2017-01-13 DIAGNOSIS — J45909 Unspecified asthma, uncomplicated: Secondary | ICD-10-CM | POA: Diagnosis not present

## 2017-01-13 DIAGNOSIS — J439 Emphysema, unspecified: Secondary | ICD-10-CM | POA: Diagnosis not present

## 2017-01-13 DIAGNOSIS — M797 Fibromyalgia: Secondary | ICD-10-CM | POA: Diagnosis not present

## 2017-01-13 DIAGNOSIS — Z87440 Personal history of urinary (tract) infections: Secondary | ICD-10-CM | POA: Diagnosis not present

## 2017-01-13 DIAGNOSIS — J984 Other disorders of lung: Secondary | ICD-10-CM | POA: Diagnosis not present

## 2017-01-13 DIAGNOSIS — N390 Urinary tract infection, site not specified: Secondary | ICD-10-CM | POA: Diagnosis not present

## 2017-01-13 DIAGNOSIS — E119 Type 2 diabetes mellitus without complications: Secondary | ICD-10-CM | POA: Diagnosis not present

## 2017-01-13 DIAGNOSIS — E785 Hyperlipidemia, unspecified: Secondary | ICD-10-CM | POA: Diagnosis not present

## 2017-01-13 SURGERY — ARTHROPLASTY, SHOULDER, TOTAL, REVERSE
Anesthesia: Choice | Laterality: Right

## 2017-01-14 DIAGNOSIS — M25311 Other instability, right shoulder: Secondary | ICD-10-CM | POA: Diagnosis not present

## 2017-01-14 LAB — AEROBIC CULTURE W GRAM STAIN (SUPERFICIAL SPECIMEN): Gram Stain: NONE SEEN

## 2017-01-14 LAB — AEROBIC CULTURE  (SUPERFICIAL SPECIMEN)

## 2017-01-17 DIAGNOSIS — E11622 Type 2 diabetes mellitus with other skin ulcer: Secondary | ICD-10-CM | POA: Diagnosis not present

## 2017-01-17 DIAGNOSIS — T8131XA Disruption of external operation (surgical) wound, not elsewhere classified, initial encounter: Secondary | ICD-10-CM | POA: Diagnosis not present

## 2017-01-18 DIAGNOSIS — E785 Hyperlipidemia, unspecified: Secondary | ICD-10-CM | POA: Diagnosis not present

## 2017-01-18 DIAGNOSIS — Z794 Long term (current) use of insulin: Secondary | ICD-10-CM | POA: Diagnosis not present

## 2017-01-18 DIAGNOSIS — E119 Type 2 diabetes mellitus without complications: Secondary | ICD-10-CM | POA: Diagnosis not present

## 2017-01-18 DIAGNOSIS — G4733 Obstructive sleep apnea (adult) (pediatric): Secondary | ICD-10-CM | POA: Diagnosis not present

## 2017-01-18 DIAGNOSIS — G510 Bell's palsy: Secondary | ICD-10-CM | POA: Diagnosis not present

## 2017-01-18 DIAGNOSIS — J45909 Unspecified asthma, uncomplicated: Secondary | ICD-10-CM | POA: Diagnosis not present

## 2017-01-18 DIAGNOSIS — M797 Fibromyalgia: Secondary | ICD-10-CM | POA: Diagnosis not present

## 2017-01-18 DIAGNOSIS — R911 Solitary pulmonary nodule: Secondary | ICD-10-CM | POA: Diagnosis not present

## 2017-01-18 DIAGNOSIS — I10 Essential (primary) hypertension: Secondary | ICD-10-CM | POA: Diagnosis not present

## 2017-01-18 DIAGNOSIS — J439 Emphysema, unspecified: Secondary | ICD-10-CM | POA: Diagnosis not present

## 2017-01-18 DIAGNOSIS — Z87440 Personal history of urinary (tract) infections: Secondary | ICD-10-CM | POA: Diagnosis not present

## 2017-01-18 DIAGNOSIS — Z7982 Long term (current) use of aspirin: Secondary | ICD-10-CM | POA: Diagnosis not present

## 2017-01-18 DIAGNOSIS — T8141XD Infection following a procedure, superficial incisional surgical site, subsequent encounter: Secondary | ICD-10-CM | POA: Diagnosis not present

## 2017-01-18 DIAGNOSIS — J984 Other disorders of lung: Secondary | ICD-10-CM | POA: Diagnosis not present

## 2017-01-18 DIAGNOSIS — D649 Anemia, unspecified: Secondary | ICD-10-CM | POA: Diagnosis not present

## 2017-01-20 DIAGNOSIS — T8141XD Infection following a procedure, superficial incisional surgical site, subsequent encounter: Secondary | ICD-10-CM | POA: Diagnosis not present

## 2017-01-20 DIAGNOSIS — G4733 Obstructive sleep apnea (adult) (pediatric): Secondary | ICD-10-CM | POA: Diagnosis not present

## 2017-01-20 DIAGNOSIS — E119 Type 2 diabetes mellitus without complications: Secondary | ICD-10-CM | POA: Diagnosis not present

## 2017-01-20 DIAGNOSIS — J45909 Unspecified asthma, uncomplicated: Secondary | ICD-10-CM | POA: Diagnosis not present

## 2017-01-20 DIAGNOSIS — M797 Fibromyalgia: Secondary | ICD-10-CM | POA: Diagnosis not present

## 2017-01-20 DIAGNOSIS — D649 Anemia, unspecified: Secondary | ICD-10-CM | POA: Diagnosis not present

## 2017-01-20 DIAGNOSIS — J984 Other disorders of lung: Secondary | ICD-10-CM | POA: Diagnosis not present

## 2017-01-20 DIAGNOSIS — Z794 Long term (current) use of insulin: Secondary | ICD-10-CM | POA: Diagnosis not present

## 2017-01-20 DIAGNOSIS — Z87440 Personal history of urinary (tract) infections: Secondary | ICD-10-CM | POA: Diagnosis not present

## 2017-01-20 DIAGNOSIS — Z7982 Long term (current) use of aspirin: Secondary | ICD-10-CM | POA: Diagnosis not present

## 2017-01-20 DIAGNOSIS — R911 Solitary pulmonary nodule: Secondary | ICD-10-CM | POA: Diagnosis not present

## 2017-01-20 DIAGNOSIS — I10 Essential (primary) hypertension: Secondary | ICD-10-CM | POA: Diagnosis not present

## 2017-01-20 DIAGNOSIS — G510 Bell's palsy: Secondary | ICD-10-CM | POA: Diagnosis not present

## 2017-01-20 DIAGNOSIS — J439 Emphysema, unspecified: Secondary | ICD-10-CM | POA: Diagnosis not present

## 2017-01-20 DIAGNOSIS — E785 Hyperlipidemia, unspecified: Secondary | ICD-10-CM | POA: Diagnosis not present

## 2017-01-24 ENCOUNTER — Encounter (HOSPITAL_BASED_OUTPATIENT_CLINIC_OR_DEPARTMENT_OTHER): Payer: PPO | Attending: Internal Medicine

## 2017-01-24 DIAGNOSIS — E11622 Type 2 diabetes mellitus with other skin ulcer: Secondary | ICD-10-CM | POA: Diagnosis not present

## 2017-01-24 DIAGNOSIS — T8131XA Disruption of external operation (surgical) wound, not elsewhere classified, initial encounter: Secondary | ICD-10-CM | POA: Diagnosis not present

## 2017-01-24 DIAGNOSIS — L03311 Cellulitis of abdominal wall: Secondary | ICD-10-CM | POA: Insufficient documentation

## 2017-01-24 DIAGNOSIS — Y838 Other surgical procedures as the cause of abnormal reaction of the patient, or of later complication, without mention of misadventure at the time of the procedure: Secondary | ICD-10-CM | POA: Insufficient documentation

## 2017-01-24 DIAGNOSIS — L98492 Non-pressure chronic ulcer of skin of other sites with fat layer exposed: Secondary | ICD-10-CM | POA: Insufficient documentation

## 2017-01-24 DIAGNOSIS — I10 Essential (primary) hypertension: Secondary | ICD-10-CM | POA: Diagnosis not present

## 2017-01-24 DIAGNOSIS — I890 Lymphedema, not elsewhere classified: Secondary | ICD-10-CM | POA: Diagnosis not present

## 2017-01-25 DIAGNOSIS — I10 Essential (primary) hypertension: Secondary | ICD-10-CM | POA: Diagnosis not present

## 2017-01-25 DIAGNOSIS — M24419 Recurrent dislocation, unspecified shoulder: Secondary | ICD-10-CM | POA: Diagnosis not present

## 2017-01-25 DIAGNOSIS — Z6841 Body Mass Index (BMI) 40.0 and over, adult: Secondary | ICD-10-CM | POA: Diagnosis not present

## 2017-01-25 DIAGNOSIS — E1129 Type 2 diabetes mellitus with other diabetic kidney complication: Secondary | ICD-10-CM | POA: Diagnosis not present

## 2017-01-25 DIAGNOSIS — E0590 Thyrotoxicosis, unspecified without thyrotoxic crisis or storm: Secondary | ICD-10-CM | POA: Diagnosis not present

## 2017-01-25 DIAGNOSIS — N390 Urinary tract infection, site not specified: Secondary | ICD-10-CM | POA: Diagnosis not present

## 2017-01-25 DIAGNOSIS — F418 Other specified anxiety disorders: Secondary | ICD-10-CM | POA: Diagnosis not present

## 2017-01-26 DIAGNOSIS — M797 Fibromyalgia: Secondary | ICD-10-CM | POA: Diagnosis not present

## 2017-01-26 DIAGNOSIS — G510 Bell's palsy: Secondary | ICD-10-CM | POA: Diagnosis not present

## 2017-01-26 DIAGNOSIS — E119 Type 2 diabetes mellitus without complications: Secondary | ICD-10-CM | POA: Diagnosis not present

## 2017-01-26 DIAGNOSIS — E785 Hyperlipidemia, unspecified: Secondary | ICD-10-CM | POA: Diagnosis not present

## 2017-01-26 DIAGNOSIS — R911 Solitary pulmonary nodule: Secondary | ICD-10-CM | POA: Diagnosis not present

## 2017-01-26 DIAGNOSIS — G4733 Obstructive sleep apnea (adult) (pediatric): Secondary | ICD-10-CM | POA: Diagnosis not present

## 2017-01-26 DIAGNOSIS — J984 Other disorders of lung: Secondary | ICD-10-CM | POA: Diagnosis not present

## 2017-01-26 DIAGNOSIS — Z87440 Personal history of urinary (tract) infections: Secondary | ICD-10-CM | POA: Diagnosis not present

## 2017-01-26 DIAGNOSIS — J45909 Unspecified asthma, uncomplicated: Secondary | ICD-10-CM | POA: Diagnosis not present

## 2017-01-26 DIAGNOSIS — I10 Essential (primary) hypertension: Secondary | ICD-10-CM | POA: Diagnosis not present

## 2017-01-26 DIAGNOSIS — D649 Anemia, unspecified: Secondary | ICD-10-CM | POA: Diagnosis not present

## 2017-01-26 DIAGNOSIS — Z7982 Long term (current) use of aspirin: Secondary | ICD-10-CM | POA: Diagnosis not present

## 2017-01-26 DIAGNOSIS — Z794 Long term (current) use of insulin: Secondary | ICD-10-CM | POA: Diagnosis not present

## 2017-01-26 DIAGNOSIS — J439 Emphysema, unspecified: Secondary | ICD-10-CM | POA: Diagnosis not present

## 2017-01-26 DIAGNOSIS — T8141XD Infection following a procedure, superficial incisional surgical site, subsequent encounter: Secondary | ICD-10-CM | POA: Diagnosis not present

## 2017-01-31 DIAGNOSIS — T8131XA Disruption of external operation (surgical) wound, not elsewhere classified, initial encounter: Secondary | ICD-10-CM | POA: Diagnosis not present

## 2017-02-01 DIAGNOSIS — T8141XD Infection following a procedure, superficial incisional surgical site, subsequent encounter: Secondary | ICD-10-CM | POA: Diagnosis not present

## 2017-02-01 DIAGNOSIS — G510 Bell's palsy: Secondary | ICD-10-CM | POA: Diagnosis not present

## 2017-02-01 DIAGNOSIS — E119 Type 2 diabetes mellitus without complications: Secondary | ICD-10-CM | POA: Diagnosis not present

## 2017-02-01 DIAGNOSIS — J439 Emphysema, unspecified: Secondary | ICD-10-CM | POA: Diagnosis not present

## 2017-02-01 DIAGNOSIS — E785 Hyperlipidemia, unspecified: Secondary | ICD-10-CM | POA: Diagnosis not present

## 2017-02-01 DIAGNOSIS — Z794 Long term (current) use of insulin: Secondary | ICD-10-CM | POA: Diagnosis not present

## 2017-02-01 DIAGNOSIS — R911 Solitary pulmonary nodule: Secondary | ICD-10-CM | POA: Diagnosis not present

## 2017-02-01 DIAGNOSIS — Z7982 Long term (current) use of aspirin: Secondary | ICD-10-CM | POA: Diagnosis not present

## 2017-02-01 DIAGNOSIS — G4733 Obstructive sleep apnea (adult) (pediatric): Secondary | ICD-10-CM | POA: Diagnosis not present

## 2017-02-01 DIAGNOSIS — I10 Essential (primary) hypertension: Secondary | ICD-10-CM | POA: Diagnosis not present

## 2017-02-01 DIAGNOSIS — J984 Other disorders of lung: Secondary | ICD-10-CM | POA: Diagnosis not present

## 2017-02-01 DIAGNOSIS — M797 Fibromyalgia: Secondary | ICD-10-CM | POA: Diagnosis not present

## 2017-02-01 DIAGNOSIS — J45909 Unspecified asthma, uncomplicated: Secondary | ICD-10-CM | POA: Diagnosis not present

## 2017-02-01 DIAGNOSIS — D649 Anemia, unspecified: Secondary | ICD-10-CM | POA: Diagnosis not present

## 2017-02-01 DIAGNOSIS — Z87440 Personal history of urinary (tract) infections: Secondary | ICD-10-CM | POA: Diagnosis not present

## 2017-02-03 DIAGNOSIS — S31109A Unspecified open wound of abdominal wall, unspecified quadrant without penetration into peritoneal cavity, initial encounter: Secondary | ICD-10-CM | POA: Diagnosis not present

## 2017-02-08 DIAGNOSIS — R911 Solitary pulmonary nodule: Secondary | ICD-10-CM | POA: Diagnosis not present

## 2017-02-08 DIAGNOSIS — M797 Fibromyalgia: Secondary | ICD-10-CM | POA: Diagnosis not present

## 2017-02-08 DIAGNOSIS — E119 Type 2 diabetes mellitus without complications: Secondary | ICD-10-CM | POA: Diagnosis not present

## 2017-02-08 DIAGNOSIS — Z7982 Long term (current) use of aspirin: Secondary | ICD-10-CM | POA: Diagnosis not present

## 2017-02-08 DIAGNOSIS — J45909 Unspecified asthma, uncomplicated: Secondary | ICD-10-CM | POA: Diagnosis not present

## 2017-02-08 DIAGNOSIS — J439 Emphysema, unspecified: Secondary | ICD-10-CM | POA: Diagnosis not present

## 2017-02-08 DIAGNOSIS — G4733 Obstructive sleep apnea (adult) (pediatric): Secondary | ICD-10-CM | POA: Diagnosis not present

## 2017-02-08 DIAGNOSIS — T8141XD Infection following a procedure, superficial incisional surgical site, subsequent encounter: Secondary | ICD-10-CM | POA: Diagnosis not present

## 2017-02-08 DIAGNOSIS — G510 Bell's palsy: Secondary | ICD-10-CM | POA: Diagnosis not present

## 2017-02-08 DIAGNOSIS — Z794 Long term (current) use of insulin: Secondary | ICD-10-CM | POA: Diagnosis not present

## 2017-02-08 DIAGNOSIS — J984 Other disorders of lung: Secondary | ICD-10-CM | POA: Diagnosis not present

## 2017-02-08 DIAGNOSIS — I10 Essential (primary) hypertension: Secondary | ICD-10-CM | POA: Diagnosis not present

## 2017-02-08 DIAGNOSIS — D649 Anemia, unspecified: Secondary | ICD-10-CM | POA: Diagnosis not present

## 2017-02-08 DIAGNOSIS — E785 Hyperlipidemia, unspecified: Secondary | ICD-10-CM | POA: Diagnosis not present

## 2017-02-08 DIAGNOSIS — Z87440 Personal history of urinary (tract) infections: Secondary | ICD-10-CM | POA: Diagnosis not present

## 2017-02-12 DIAGNOSIS — G4733 Obstructive sleep apnea (adult) (pediatric): Secondary | ICD-10-CM | POA: Diagnosis not present

## 2017-02-12 DIAGNOSIS — E119 Type 2 diabetes mellitus without complications: Secondary | ICD-10-CM | POA: Diagnosis not present

## 2017-02-12 DIAGNOSIS — L03119 Cellulitis of unspecified part of limb: Secondary | ICD-10-CM | POA: Diagnosis not present

## 2017-02-12 DIAGNOSIS — R269 Unspecified abnormalities of gait and mobility: Secondary | ICD-10-CM | POA: Diagnosis not present

## 2017-02-15 DIAGNOSIS — Z794 Long term (current) use of insulin: Secondary | ICD-10-CM | POA: Diagnosis not present

## 2017-02-15 DIAGNOSIS — R911 Solitary pulmonary nodule: Secondary | ICD-10-CM | POA: Diagnosis not present

## 2017-02-15 DIAGNOSIS — G510 Bell's palsy: Secondary | ICD-10-CM | POA: Diagnosis not present

## 2017-02-15 DIAGNOSIS — D649 Anemia, unspecified: Secondary | ICD-10-CM | POA: Diagnosis not present

## 2017-02-15 DIAGNOSIS — E119 Type 2 diabetes mellitus without complications: Secondary | ICD-10-CM | POA: Diagnosis not present

## 2017-02-15 DIAGNOSIS — G4733 Obstructive sleep apnea (adult) (pediatric): Secondary | ICD-10-CM | POA: Diagnosis not present

## 2017-02-15 DIAGNOSIS — E785 Hyperlipidemia, unspecified: Secondary | ICD-10-CM | POA: Diagnosis not present

## 2017-02-15 DIAGNOSIS — Z87440 Personal history of urinary (tract) infections: Secondary | ICD-10-CM | POA: Diagnosis not present

## 2017-02-15 DIAGNOSIS — M797 Fibromyalgia: Secondary | ICD-10-CM | POA: Diagnosis not present

## 2017-02-15 DIAGNOSIS — T8141XD Infection following a procedure, superficial incisional surgical site, subsequent encounter: Secondary | ICD-10-CM | POA: Diagnosis not present

## 2017-02-15 DIAGNOSIS — I10 Essential (primary) hypertension: Secondary | ICD-10-CM | POA: Diagnosis not present

## 2017-02-15 DIAGNOSIS — J45909 Unspecified asthma, uncomplicated: Secondary | ICD-10-CM | POA: Diagnosis not present

## 2017-02-15 DIAGNOSIS — J984 Other disorders of lung: Secondary | ICD-10-CM | POA: Diagnosis not present

## 2017-02-15 DIAGNOSIS — Z7982 Long term (current) use of aspirin: Secondary | ICD-10-CM | POA: Diagnosis not present

## 2017-02-15 DIAGNOSIS — J439 Emphysema, unspecified: Secondary | ICD-10-CM | POA: Diagnosis not present

## 2017-02-16 DIAGNOSIS — K219 Gastro-esophageal reflux disease without esophagitis: Secondary | ICD-10-CM | POA: Diagnosis not present

## 2017-02-16 DIAGNOSIS — D649 Anemia, unspecified: Secondary | ICD-10-CM | POA: Diagnosis not present

## 2017-02-16 DIAGNOSIS — Z885 Allergy status to narcotic agent status: Secondary | ICD-10-CM | POA: Diagnosis not present

## 2017-02-16 DIAGNOSIS — G2581 Restless legs syndrome: Secondary | ICD-10-CM | POA: Diagnosis not present

## 2017-02-16 DIAGNOSIS — E119 Type 2 diabetes mellitus without complications: Secondary | ICD-10-CM | POA: Diagnosis not present

## 2017-02-16 DIAGNOSIS — Z6841 Body Mass Index (BMI) 40.0 and over, adult: Secondary | ICD-10-CM | POA: Diagnosis not present

## 2017-02-16 DIAGNOSIS — E785 Hyperlipidemia, unspecified: Secondary | ICD-10-CM | POA: Diagnosis not present

## 2017-02-16 DIAGNOSIS — Z794 Long term (current) use of insulin: Secondary | ICD-10-CM | POA: Diagnosis not present

## 2017-02-16 DIAGNOSIS — Z791 Long term (current) use of non-steroidal anti-inflammatories (NSAID): Secondary | ICD-10-CM | POA: Diagnosis not present

## 2017-02-16 DIAGNOSIS — Z882 Allergy status to sulfonamides status: Secondary | ICD-10-CM | POA: Diagnosis not present

## 2017-02-16 DIAGNOSIS — Z79899 Other long term (current) drug therapy: Secondary | ICD-10-CM | POA: Diagnosis not present

## 2017-02-16 DIAGNOSIS — S31109A Unspecified open wound of abdominal wall, unspecified quadrant without penetration into peritoneal cavity, initial encounter: Secondary | ICD-10-CM | POA: Diagnosis not present

## 2017-02-16 DIAGNOSIS — Z9884 Bariatric surgery status: Secondary | ICD-10-CM | POA: Diagnosis not present

## 2017-02-16 DIAGNOSIS — I10 Essential (primary) hypertension: Secondary | ICD-10-CM | POA: Diagnosis not present

## 2017-02-16 DIAGNOSIS — Z888 Allergy status to other drugs, medicaments and biological substances status: Secondary | ICD-10-CM | POA: Diagnosis not present

## 2017-02-16 DIAGNOSIS — E039 Hypothyroidism, unspecified: Secondary | ICD-10-CM | POA: Diagnosis not present

## 2017-02-16 DIAGNOSIS — G4733 Obstructive sleep apnea (adult) (pediatric): Secondary | ICD-10-CM | POA: Diagnosis not present

## 2017-02-16 DIAGNOSIS — Z79891 Long term (current) use of opiate analgesic: Secondary | ICD-10-CM | POA: Diagnosis not present

## 2017-02-17 DIAGNOSIS — E11622 Type 2 diabetes mellitus with other skin ulcer: Secondary | ICD-10-CM | POA: Diagnosis not present

## 2017-02-18 DIAGNOSIS — S31104D Unspecified open wound of abdominal wall, left lower quadrant without penetration into peritoneal cavity, subsequent encounter: Secondary | ICD-10-CM | POA: Diagnosis not present

## 2017-02-18 DIAGNOSIS — S31109A Unspecified open wound of abdominal wall, unspecified quadrant without penetration into peritoneal cavity, initial encounter: Secondary | ICD-10-CM | POA: Diagnosis not present

## 2017-02-19 DIAGNOSIS — E11622 Type 2 diabetes mellitus with other skin ulcer: Secondary | ICD-10-CM | POA: Diagnosis not present

## 2017-02-22 DIAGNOSIS — E119 Type 2 diabetes mellitus without complications: Secondary | ICD-10-CM | POA: Diagnosis not present

## 2017-02-22 DIAGNOSIS — E785 Hyperlipidemia, unspecified: Secondary | ICD-10-CM | POA: Diagnosis not present

## 2017-02-22 DIAGNOSIS — T8141XD Infection following a procedure, superficial incisional surgical site, subsequent encounter: Secondary | ICD-10-CM | POA: Diagnosis not present

## 2017-02-22 DIAGNOSIS — M797 Fibromyalgia: Secondary | ICD-10-CM | POA: Diagnosis not present

## 2017-02-22 DIAGNOSIS — G510 Bell's palsy: Secondary | ICD-10-CM | POA: Diagnosis not present

## 2017-02-22 DIAGNOSIS — J45909 Unspecified asthma, uncomplicated: Secondary | ICD-10-CM | POA: Diagnosis not present

## 2017-02-22 DIAGNOSIS — I10 Essential (primary) hypertension: Secondary | ICD-10-CM | POA: Diagnosis not present

## 2017-02-22 DIAGNOSIS — Z794 Long term (current) use of insulin: Secondary | ICD-10-CM | POA: Diagnosis not present

## 2017-02-22 DIAGNOSIS — G4733 Obstructive sleep apnea (adult) (pediatric): Secondary | ICD-10-CM | POA: Diagnosis not present

## 2017-02-22 DIAGNOSIS — J984 Other disorders of lung: Secondary | ICD-10-CM | POA: Diagnosis not present

## 2017-02-22 DIAGNOSIS — J439 Emphysema, unspecified: Secondary | ICD-10-CM | POA: Diagnosis not present

## 2017-02-22 DIAGNOSIS — R911 Solitary pulmonary nodule: Secondary | ICD-10-CM | POA: Diagnosis not present

## 2017-02-22 DIAGNOSIS — Z87440 Personal history of urinary (tract) infections: Secondary | ICD-10-CM | POA: Diagnosis not present

## 2017-02-22 DIAGNOSIS — Z7982 Long term (current) use of aspirin: Secondary | ICD-10-CM | POA: Diagnosis not present

## 2017-02-22 DIAGNOSIS — D649 Anemia, unspecified: Secondary | ICD-10-CM | POA: Diagnosis not present

## 2017-02-24 DIAGNOSIS — E11622 Type 2 diabetes mellitus with other skin ulcer: Secondary | ICD-10-CM | POA: Diagnosis not present

## 2017-03-02 DIAGNOSIS — Z794 Long term (current) use of insulin: Secondary | ICD-10-CM | POA: Diagnosis not present

## 2017-03-02 DIAGNOSIS — G4733 Obstructive sleep apnea (adult) (pediatric): Secondary | ICD-10-CM | POA: Diagnosis not present

## 2017-03-02 DIAGNOSIS — I10 Essential (primary) hypertension: Secondary | ICD-10-CM | POA: Diagnosis not present

## 2017-03-02 DIAGNOSIS — J439 Emphysema, unspecified: Secondary | ICD-10-CM | POA: Diagnosis not present

## 2017-03-02 DIAGNOSIS — Z87440 Personal history of urinary (tract) infections: Secondary | ICD-10-CM | POA: Diagnosis not present

## 2017-03-02 DIAGNOSIS — Z7982 Long term (current) use of aspirin: Secondary | ICD-10-CM | POA: Diagnosis not present

## 2017-03-02 DIAGNOSIS — J45909 Unspecified asthma, uncomplicated: Secondary | ICD-10-CM | POA: Diagnosis not present

## 2017-03-02 DIAGNOSIS — E785 Hyperlipidemia, unspecified: Secondary | ICD-10-CM | POA: Diagnosis not present

## 2017-03-02 DIAGNOSIS — T8141XD Infection following a procedure, superficial incisional surgical site, subsequent encounter: Secondary | ICD-10-CM | POA: Diagnosis not present

## 2017-03-02 DIAGNOSIS — M797 Fibromyalgia: Secondary | ICD-10-CM | POA: Diagnosis not present

## 2017-03-02 DIAGNOSIS — J984 Other disorders of lung: Secondary | ICD-10-CM | POA: Diagnosis not present

## 2017-03-02 DIAGNOSIS — R911 Solitary pulmonary nodule: Secondary | ICD-10-CM | POA: Diagnosis not present

## 2017-03-02 DIAGNOSIS — G510 Bell's palsy: Secondary | ICD-10-CM | POA: Diagnosis not present

## 2017-03-02 DIAGNOSIS — E119 Type 2 diabetes mellitus without complications: Secondary | ICD-10-CM | POA: Diagnosis not present

## 2017-03-02 DIAGNOSIS — D649 Anemia, unspecified: Secondary | ICD-10-CM | POA: Diagnosis not present

## 2017-03-07 DIAGNOSIS — E11622 Type 2 diabetes mellitus with other skin ulcer: Secondary | ICD-10-CM | POA: Diagnosis not present

## 2017-03-09 DIAGNOSIS — E785 Hyperlipidemia, unspecified: Secondary | ICD-10-CM | POA: Diagnosis not present

## 2017-03-09 DIAGNOSIS — Z7982 Long term (current) use of aspirin: Secondary | ICD-10-CM | POA: Diagnosis not present

## 2017-03-09 DIAGNOSIS — Z794 Long term (current) use of insulin: Secondary | ICD-10-CM | POA: Diagnosis not present

## 2017-03-09 DIAGNOSIS — I10 Essential (primary) hypertension: Secondary | ICD-10-CM | POA: Diagnosis not present

## 2017-03-09 DIAGNOSIS — G510 Bell's palsy: Secondary | ICD-10-CM | POA: Diagnosis not present

## 2017-03-09 DIAGNOSIS — Z87440 Personal history of urinary (tract) infections: Secondary | ICD-10-CM | POA: Diagnosis not present

## 2017-03-09 DIAGNOSIS — E119 Type 2 diabetes mellitus without complications: Secondary | ICD-10-CM | POA: Diagnosis not present

## 2017-03-09 DIAGNOSIS — D649 Anemia, unspecified: Secondary | ICD-10-CM | POA: Diagnosis not present

## 2017-03-09 DIAGNOSIS — R911 Solitary pulmonary nodule: Secondary | ICD-10-CM | POA: Diagnosis not present

## 2017-03-09 DIAGNOSIS — J984 Other disorders of lung: Secondary | ICD-10-CM | POA: Diagnosis not present

## 2017-03-09 DIAGNOSIS — G4733 Obstructive sleep apnea (adult) (pediatric): Secondary | ICD-10-CM | POA: Diagnosis not present

## 2017-03-09 DIAGNOSIS — M797 Fibromyalgia: Secondary | ICD-10-CM | POA: Diagnosis not present

## 2017-03-09 DIAGNOSIS — J45909 Unspecified asthma, uncomplicated: Secondary | ICD-10-CM | POA: Diagnosis not present

## 2017-03-09 DIAGNOSIS — T8141XD Infection following a procedure, superficial incisional surgical site, subsequent encounter: Secondary | ICD-10-CM | POA: Diagnosis not present

## 2017-03-09 DIAGNOSIS — J439 Emphysema, unspecified: Secondary | ICD-10-CM | POA: Diagnosis not present

## 2017-03-11 DIAGNOSIS — Z87440 Personal history of urinary (tract) infections: Secondary | ICD-10-CM | POA: Diagnosis not present

## 2017-03-14 DIAGNOSIS — R269 Unspecified abnormalities of gait and mobility: Secondary | ICD-10-CM | POA: Diagnosis not present

## 2017-03-14 DIAGNOSIS — L03119 Cellulitis of unspecified part of limb: Secondary | ICD-10-CM | POA: Diagnosis not present

## 2017-03-14 DIAGNOSIS — E119 Type 2 diabetes mellitus without complications: Secondary | ICD-10-CM | POA: Diagnosis not present

## 2017-03-14 DIAGNOSIS — G4733 Obstructive sleep apnea (adult) (pediatric): Secondary | ICD-10-CM | POA: Diagnosis not present

## 2017-03-19 DIAGNOSIS — E11622 Type 2 diabetes mellitus with other skin ulcer: Secondary | ICD-10-CM | POA: Diagnosis not present

## 2017-03-22 DIAGNOSIS — Z794 Long term (current) use of insulin: Secondary | ICD-10-CM | POA: Diagnosis not present

## 2017-03-22 DIAGNOSIS — E119 Type 2 diabetes mellitus without complications: Secondary | ICD-10-CM | POA: Diagnosis not present

## 2017-03-22 DIAGNOSIS — G4733 Obstructive sleep apnea (adult) (pediatric): Secondary | ICD-10-CM | POA: Diagnosis not present

## 2017-03-22 DIAGNOSIS — J45909 Unspecified asthma, uncomplicated: Secondary | ICD-10-CM | POA: Diagnosis not present

## 2017-03-22 DIAGNOSIS — Z7951 Long term (current) use of inhaled steroids: Secondary | ICD-10-CM | POA: Diagnosis not present

## 2017-03-22 DIAGNOSIS — T8141XD Infection following a procedure, superficial incisional surgical site, subsequent encounter: Secondary | ICD-10-CM | POA: Diagnosis not present

## 2017-03-22 DIAGNOSIS — D649 Anemia, unspecified: Secondary | ICD-10-CM | POA: Diagnosis not present

## 2017-03-22 DIAGNOSIS — J439 Emphysema, unspecified: Secondary | ICD-10-CM | POA: Diagnosis not present

## 2017-03-22 DIAGNOSIS — I10 Essential (primary) hypertension: Secondary | ICD-10-CM | POA: Diagnosis not present

## 2017-03-22 DIAGNOSIS — R911 Solitary pulmonary nodule: Secondary | ICD-10-CM | POA: Diagnosis not present

## 2017-03-22 DIAGNOSIS — G510 Bell's palsy: Secondary | ICD-10-CM | POA: Diagnosis not present

## 2017-03-22 DIAGNOSIS — E11622 Type 2 diabetes mellitus with other skin ulcer: Secondary | ICD-10-CM | POA: Diagnosis not present

## 2017-03-22 DIAGNOSIS — J984 Other disorders of lung: Secondary | ICD-10-CM | POA: Diagnosis not present

## 2017-03-22 DIAGNOSIS — M797 Fibromyalgia: Secondary | ICD-10-CM | POA: Diagnosis not present

## 2017-03-22 DIAGNOSIS — E785 Hyperlipidemia, unspecified: Secondary | ICD-10-CM | POA: Diagnosis not present

## 2017-03-22 DIAGNOSIS — Z87440 Personal history of urinary (tract) infections: Secondary | ICD-10-CM | POA: Diagnosis not present

## 2017-03-22 DIAGNOSIS — Z7982 Long term (current) use of aspirin: Secondary | ICD-10-CM | POA: Diagnosis not present

## 2017-03-23 DIAGNOSIS — G4733 Obstructive sleep apnea (adult) (pediatric): Secondary | ICD-10-CM | POA: Diagnosis not present

## 2017-03-23 DIAGNOSIS — G510 Bell's palsy: Secondary | ICD-10-CM | POA: Diagnosis not present

## 2017-03-23 DIAGNOSIS — D649 Anemia, unspecified: Secondary | ICD-10-CM | POA: Diagnosis not present

## 2017-03-23 DIAGNOSIS — I10 Essential (primary) hypertension: Secondary | ICD-10-CM | POA: Diagnosis not present

## 2017-03-23 DIAGNOSIS — M797 Fibromyalgia: Secondary | ICD-10-CM | POA: Diagnosis not present

## 2017-03-23 DIAGNOSIS — J45909 Unspecified asthma, uncomplicated: Secondary | ICD-10-CM | POA: Diagnosis not present

## 2017-03-23 DIAGNOSIS — E785 Hyperlipidemia, unspecified: Secondary | ICD-10-CM | POA: Diagnosis not present

## 2017-03-23 DIAGNOSIS — E119 Type 2 diabetes mellitus without complications: Secondary | ICD-10-CM | POA: Diagnosis not present

## 2017-03-23 DIAGNOSIS — R911 Solitary pulmonary nodule: Secondary | ICD-10-CM | POA: Diagnosis not present

## 2017-03-23 DIAGNOSIS — J984 Other disorders of lung: Secondary | ICD-10-CM | POA: Diagnosis not present

## 2017-03-23 DIAGNOSIS — T8141XD Infection following a procedure, superficial incisional surgical site, subsequent encounter: Secondary | ICD-10-CM | POA: Diagnosis not present

## 2017-03-23 DIAGNOSIS — Z7951 Long term (current) use of inhaled steroids: Secondary | ICD-10-CM | POA: Diagnosis not present

## 2017-03-23 DIAGNOSIS — Z7982 Long term (current) use of aspirin: Secondary | ICD-10-CM | POA: Diagnosis not present

## 2017-03-23 DIAGNOSIS — Z87440 Personal history of urinary (tract) infections: Secondary | ICD-10-CM | POA: Diagnosis not present

## 2017-03-23 DIAGNOSIS — J439 Emphysema, unspecified: Secondary | ICD-10-CM | POA: Diagnosis not present

## 2017-03-23 DIAGNOSIS — Z794 Long term (current) use of insulin: Secondary | ICD-10-CM | POA: Diagnosis not present

## 2017-03-23 DIAGNOSIS — E11622 Type 2 diabetes mellitus with other skin ulcer: Secondary | ICD-10-CM | POA: Diagnosis not present

## 2017-03-24 DIAGNOSIS — R269 Unspecified abnormalities of gait and mobility: Secondary | ICD-10-CM | POA: Diagnosis not present

## 2017-03-24 DIAGNOSIS — I10 Essential (primary) hypertension: Secondary | ICD-10-CM | POA: Diagnosis not present

## 2017-03-24 DIAGNOSIS — E785 Hyperlipidemia, unspecified: Secondary | ICD-10-CM | POA: Diagnosis not present

## 2017-03-24 DIAGNOSIS — Z7982 Long term (current) use of aspirin: Secondary | ICD-10-CM | POA: Diagnosis not present

## 2017-03-24 DIAGNOSIS — J439 Emphysema, unspecified: Secondary | ICD-10-CM | POA: Diagnosis not present

## 2017-03-24 DIAGNOSIS — D649 Anemia, unspecified: Secondary | ICD-10-CM | POA: Diagnosis not present

## 2017-03-24 DIAGNOSIS — Z7951 Long term (current) use of inhaled steroids: Secondary | ICD-10-CM | POA: Diagnosis not present

## 2017-03-24 DIAGNOSIS — Z87440 Personal history of urinary (tract) infections: Secondary | ICD-10-CM | POA: Diagnosis not present

## 2017-03-24 DIAGNOSIS — G510 Bell's palsy: Secondary | ICD-10-CM | POA: Diagnosis not present

## 2017-03-24 DIAGNOSIS — R911 Solitary pulmonary nodule: Secondary | ICD-10-CM | POA: Diagnosis not present

## 2017-03-24 DIAGNOSIS — T8141XD Infection following a procedure, superficial incisional surgical site, subsequent encounter: Secondary | ICD-10-CM | POA: Diagnosis not present

## 2017-03-24 DIAGNOSIS — Z794 Long term (current) use of insulin: Secondary | ICD-10-CM | POA: Diagnosis not present

## 2017-03-24 DIAGNOSIS — E119 Type 2 diabetes mellitus without complications: Secondary | ICD-10-CM | POA: Diagnosis not present

## 2017-03-24 DIAGNOSIS — M797 Fibromyalgia: Secondary | ICD-10-CM | POA: Diagnosis not present

## 2017-03-24 DIAGNOSIS — N390 Urinary tract infection, site not specified: Secondary | ICD-10-CM | POA: Diagnosis not present

## 2017-03-24 DIAGNOSIS — L03119 Cellulitis of unspecified part of limb: Secondary | ICD-10-CM | POA: Diagnosis not present

## 2017-03-24 DIAGNOSIS — J984 Other disorders of lung: Secondary | ICD-10-CM | POA: Diagnosis not present

## 2017-03-24 DIAGNOSIS — J45909 Unspecified asthma, uncomplicated: Secondary | ICD-10-CM | POA: Diagnosis not present

## 2017-03-24 DIAGNOSIS — G4733 Obstructive sleep apnea (adult) (pediatric): Secondary | ICD-10-CM | POA: Diagnosis not present

## 2017-03-25 DIAGNOSIS — E119 Type 2 diabetes mellitus without complications: Secondary | ICD-10-CM | POA: Diagnosis not present

## 2017-03-25 DIAGNOSIS — K219 Gastro-esophageal reflux disease without esophagitis: Secondary | ICD-10-CM | POA: Diagnosis not present

## 2017-03-25 DIAGNOSIS — Z794 Long term (current) use of insulin: Secondary | ICD-10-CM | POA: Diagnosis not present

## 2017-03-25 DIAGNOSIS — G4733 Obstructive sleep apnea (adult) (pediatric): Secondary | ICD-10-CM | POA: Diagnosis not present

## 2017-03-25 DIAGNOSIS — J45909 Unspecified asthma, uncomplicated: Secondary | ICD-10-CM | POA: Diagnosis not present

## 2017-03-25 DIAGNOSIS — S31109A Unspecified open wound of abdominal wall, unspecified quadrant without penetration into peritoneal cavity, initial encounter: Secondary | ICD-10-CM | POA: Diagnosis not present

## 2017-03-25 DIAGNOSIS — E039 Hypothyroidism, unspecified: Secondary | ICD-10-CM | POA: Diagnosis not present

## 2017-03-26 DIAGNOSIS — Z4801 Encounter for change or removal of surgical wound dressing: Secondary | ICD-10-CM | POA: Diagnosis not present

## 2017-03-27 DIAGNOSIS — Z4801 Encounter for change or removal of surgical wound dressing: Secondary | ICD-10-CM | POA: Diagnosis not present

## 2017-03-27 DIAGNOSIS — Z945 Skin transplant status: Secondary | ICD-10-CM | POA: Diagnosis not present

## 2017-03-28 DIAGNOSIS — Z87440 Personal history of urinary (tract) infections: Secondary | ICD-10-CM | POA: Diagnosis not present

## 2017-03-28 DIAGNOSIS — G4733 Obstructive sleep apnea (adult) (pediatric): Secondary | ICD-10-CM | POA: Diagnosis not present

## 2017-03-28 DIAGNOSIS — Z7951 Long term (current) use of inhaled steroids: Secondary | ICD-10-CM | POA: Diagnosis not present

## 2017-03-28 DIAGNOSIS — E119 Type 2 diabetes mellitus without complications: Secondary | ICD-10-CM | POA: Diagnosis not present

## 2017-03-28 DIAGNOSIS — G510 Bell's palsy: Secondary | ICD-10-CM | POA: Diagnosis not present

## 2017-03-28 DIAGNOSIS — Z794 Long term (current) use of insulin: Secondary | ICD-10-CM | POA: Diagnosis not present

## 2017-03-28 DIAGNOSIS — T8141XD Infection following a procedure, superficial incisional surgical site, subsequent encounter: Secondary | ICD-10-CM | POA: Diagnosis not present

## 2017-03-28 DIAGNOSIS — J984 Other disorders of lung: Secondary | ICD-10-CM | POA: Diagnosis not present

## 2017-03-28 DIAGNOSIS — I10 Essential (primary) hypertension: Secondary | ICD-10-CM | POA: Diagnosis not present

## 2017-03-28 DIAGNOSIS — D649 Anemia, unspecified: Secondary | ICD-10-CM | POA: Diagnosis not present

## 2017-03-28 DIAGNOSIS — Z7982 Long term (current) use of aspirin: Secondary | ICD-10-CM | POA: Diagnosis not present

## 2017-03-28 DIAGNOSIS — E785 Hyperlipidemia, unspecified: Secondary | ICD-10-CM | POA: Diagnosis not present

## 2017-03-28 DIAGNOSIS — J439 Emphysema, unspecified: Secondary | ICD-10-CM | POA: Diagnosis not present

## 2017-03-28 DIAGNOSIS — R911 Solitary pulmonary nodule: Secondary | ICD-10-CM | POA: Diagnosis not present

## 2017-03-28 DIAGNOSIS — M797 Fibromyalgia: Secondary | ICD-10-CM | POA: Diagnosis not present

## 2017-03-28 DIAGNOSIS — J45909 Unspecified asthma, uncomplicated: Secondary | ICD-10-CM | POA: Diagnosis not present

## 2017-03-29 DIAGNOSIS — S31109A Unspecified open wound of abdominal wall, unspecified quadrant without penetration into peritoneal cavity, initial encounter: Secondary | ICD-10-CM | POA: Insufficient documentation

## 2017-04-14 DIAGNOSIS — R269 Unspecified abnormalities of gait and mobility: Secondary | ICD-10-CM | POA: Diagnosis not present

## 2017-04-14 DIAGNOSIS — E119 Type 2 diabetes mellitus without complications: Secondary | ICD-10-CM | POA: Diagnosis not present

## 2017-04-14 DIAGNOSIS — G4733 Obstructive sleep apnea (adult) (pediatric): Secondary | ICD-10-CM | POA: Diagnosis not present

## 2017-04-14 DIAGNOSIS — L03119 Cellulitis of unspecified part of limb: Secondary | ICD-10-CM | POA: Diagnosis not present

## 2017-04-27 DIAGNOSIS — E119 Type 2 diabetes mellitus without complications: Secondary | ICD-10-CM | POA: Diagnosis not present

## 2017-04-27 DIAGNOSIS — T8141XD Infection following a procedure, superficial incisional surgical site, subsequent encounter: Secondary | ICD-10-CM | POA: Diagnosis not present

## 2017-04-27 DIAGNOSIS — I10 Essential (primary) hypertension: Secondary | ICD-10-CM | POA: Diagnosis not present

## 2017-04-27 DIAGNOSIS — M797 Fibromyalgia: Secondary | ICD-10-CM | POA: Diagnosis not present

## 2017-04-27 DIAGNOSIS — Z87440 Personal history of urinary (tract) infections: Secondary | ICD-10-CM | POA: Diagnosis not present

## 2017-04-27 DIAGNOSIS — Z7982 Long term (current) use of aspirin: Secondary | ICD-10-CM | POA: Diagnosis not present

## 2017-04-27 DIAGNOSIS — J449 Chronic obstructive pulmonary disease, unspecified: Secondary | ICD-10-CM | POA: Diagnosis not present

## 2017-04-27 DIAGNOSIS — D649 Anemia, unspecified: Secondary | ICD-10-CM | POA: Diagnosis not present

## 2017-04-27 DIAGNOSIS — Z7951 Long term (current) use of inhaled steroids: Secondary | ICD-10-CM | POA: Diagnosis not present

## 2017-04-27 DIAGNOSIS — E785 Hyperlipidemia, unspecified: Secondary | ICD-10-CM | POA: Diagnosis not present

## 2017-04-27 DIAGNOSIS — G4733 Obstructive sleep apnea (adult) (pediatric): Secondary | ICD-10-CM | POA: Diagnosis not present

## 2017-04-27 DIAGNOSIS — J984 Other disorders of lung: Secondary | ICD-10-CM | POA: Diagnosis not present

## 2017-05-03 DIAGNOSIS — J449 Chronic obstructive pulmonary disease, unspecified: Secondary | ICD-10-CM | POA: Diagnosis not present

## 2017-05-03 DIAGNOSIS — T8141XD Infection following a procedure, superficial incisional surgical site, subsequent encounter: Secondary | ICD-10-CM | POA: Diagnosis not present

## 2017-05-03 DIAGNOSIS — D649 Anemia, unspecified: Secondary | ICD-10-CM | POA: Diagnosis not present

## 2017-05-03 DIAGNOSIS — J984 Other disorders of lung: Secondary | ICD-10-CM | POA: Diagnosis not present

## 2017-05-03 DIAGNOSIS — I10 Essential (primary) hypertension: Secondary | ICD-10-CM | POA: Diagnosis not present

## 2017-05-03 DIAGNOSIS — E119 Type 2 diabetes mellitus without complications: Secondary | ICD-10-CM | POA: Diagnosis not present

## 2017-05-04 DIAGNOSIS — E119 Type 2 diabetes mellitus without complications: Secondary | ICD-10-CM | POA: Diagnosis not present

## 2017-05-04 DIAGNOSIS — T8141XD Infection following a procedure, superficial incisional surgical site, subsequent encounter: Secondary | ICD-10-CM | POA: Diagnosis not present

## 2017-05-04 DIAGNOSIS — J449 Chronic obstructive pulmonary disease, unspecified: Secondary | ICD-10-CM | POA: Diagnosis not present

## 2017-05-04 DIAGNOSIS — I10 Essential (primary) hypertension: Secondary | ICD-10-CM | POA: Diagnosis not present

## 2017-05-04 DIAGNOSIS — J984 Other disorders of lung: Secondary | ICD-10-CM | POA: Diagnosis not present

## 2017-05-04 DIAGNOSIS — D649 Anemia, unspecified: Secondary | ICD-10-CM | POA: Diagnosis not present

## 2017-05-10 DIAGNOSIS — M48062 Spinal stenosis, lumbar region with neurogenic claudication: Secondary | ICD-10-CM | POA: Diagnosis not present

## 2017-05-10 DIAGNOSIS — I10 Essential (primary) hypertension: Secondary | ICD-10-CM | POA: Diagnosis not present

## 2017-05-10 DIAGNOSIS — Z6841 Body Mass Index (BMI) 40.0 and over, adult: Secondary | ICD-10-CM | POA: Diagnosis not present

## 2017-05-17 DIAGNOSIS — M48062 Spinal stenosis, lumbar region with neurogenic claudication: Secondary | ICD-10-CM | POA: Diagnosis not present

## 2017-05-25 DIAGNOSIS — M19211 Secondary osteoarthritis, right shoulder: Secondary | ICD-10-CM | POA: Diagnosis not present

## 2017-06-08 DIAGNOSIS — I10 Essential (primary) hypertension: Secondary | ICD-10-CM | POA: Diagnosis not present

## 2017-06-08 DIAGNOSIS — Z6841 Body Mass Index (BMI) 40.0 and over, adult: Secondary | ICD-10-CM | POA: Diagnosis not present

## 2017-06-08 DIAGNOSIS — M48062 Spinal stenosis, lumbar region with neurogenic claudication: Secondary | ICD-10-CM | POA: Diagnosis not present

## 2017-06-13 ENCOUNTER — Other Ambulatory Visit: Payer: Self-pay | Admitting: Orthopedic Surgery

## 2017-06-13 ENCOUNTER — Other Ambulatory Visit (HOSPITAL_COMMUNITY): Payer: PPO

## 2017-06-13 ENCOUNTER — Ambulatory Visit
Admission: RE | Admit: 2017-06-13 | Discharge: 2017-06-13 | Disposition: A | Discharge status: Home / Self Care | Payer: BLUE CROSS/BLUE SHIELD | Source: Ambulatory Visit | Attending: Orthopedic Surgery | Admitting: Orthopedic Surgery

## 2017-06-13 DIAGNOSIS — E119 Type 2 diabetes mellitus without complications: Secondary | ICD-10-CM | POA: Diagnosis not present

## 2017-06-13 DIAGNOSIS — S43004A Unspecified dislocation of right shoulder joint, initial encounter: Secondary | ICD-10-CM

## 2017-06-13 DIAGNOSIS — S43014A Anterior dislocation of right humerus, initial encounter: Secondary | ICD-10-CM | POA: Diagnosis not present

## 2017-06-13 DIAGNOSIS — M25311 Other instability, right shoulder: Secondary | ICD-10-CM | POA: Diagnosis not present

## 2017-06-17 DIAGNOSIS — M25311 Other instability, right shoulder: Secondary | ICD-10-CM | POA: Diagnosis not present

## 2017-06-21 DIAGNOSIS — N390 Urinary tract infection, site not specified: Secondary | ICD-10-CM | POA: Diagnosis not present

## 2017-06-21 DIAGNOSIS — N3941 Urge incontinence: Secondary | ICD-10-CM | POA: Diagnosis not present

## 2017-06-27 ENCOUNTER — Other Ambulatory Visit: Payer: Self-pay | Admitting: Orthopedic Surgery

## 2017-06-29 DIAGNOSIS — E1129 Type 2 diabetes mellitus with other diabetic kidney complication: Secondary | ICD-10-CM | POA: Diagnosis not present

## 2017-06-29 DIAGNOSIS — E7849 Other hyperlipidemia: Secondary | ICD-10-CM | POA: Diagnosis not present

## 2017-06-29 DIAGNOSIS — E038 Other specified hypothyroidism: Secondary | ICD-10-CM | POA: Diagnosis not present

## 2017-06-29 DIAGNOSIS — R82998 Other abnormal findings in urine: Secondary | ICD-10-CM | POA: Diagnosis not present

## 2017-07-12 NOTE — Pre-Procedure Instructions (Signed)
[name]  07/12/2017      [facility] [place], [place] [address] [place] [address] Phone: [phone] Fax: [phone]    Your procedure is scheduled on 07/21/2017.  Report to [facility] Admitting at [place].M.  Call this number if you have problems the morning of surgery:  [phone]   Remember:  Do not eat food or drink liquids after midnight.   Continue all medications as directed by your physician except follow these medication instructions before surgery below   Take these medicines the morning of surgery with A SIP OF WATER: Albuterol inhaler - if needed (Bring with you to the hospital) Albuterol nebulizer - if needed Clonazepam (Klonopin) - if needed Cyclobenzaprine (Flexeril) - if needed Fluticasone (Flonase) Gabapentin (Neurontin) - if needed Hydrocodone-acetaminphen (Norco/Vicodin) - if needed levocetirizine (Xyzal) Metoprolol (Lopressor) Montelukast (Singular) - if needed Eye drops Ondansetron (Zofran) - if needed Propylthiouracil (PTU)  Ropinirole (Requip) - if needed  7 days prior to surgery STOP taking any Aspirin (unless otherwise instructed by your surgeon), Aleve, Naproxen, Ibuprofen, Motrin, Advil, Goody's, BC's, all herbal medications, fish oil, and all vitamins   How to Manage Your Diabetes Before and After Surgery  Why is it important to control my blood sugar before and after surgery? . Improving blood sugar levels before and after surgery helps healing and can limit problems. . A way of improving blood sugar control is eating a healthy diet by: o  Eating less sugar and carbohydrates o  Increasing activity/exercise o  Talking with your doctor about reaching your blood sugar goals . High blood sugars (greater than 180 mg/dL) can raise your risk of infections and slow your recovery, so you will need to focus on controlling  your diabetes during the weeks before surgery. . Make sure that the doctor who takes care of your diabetes knows about your planned surgery including the date and location.  How do I manage my blood sugar before surgery? . Check your blood sugar at least 4 times a day, starting 2 days before surgery, to make sure that the level is not too high or low. o Check your blood sugar the morning of your surgery when you wake up and every 2 hours until you get to the Short Stay unit. . If your blood sugar is less than 70 mg/dL, you will need to treat for low blood sugar: o Do not take insulin. o Treat a low blood sugar (less than 70 mg/dL) with  cup of clear juice (cranberry or apple), 4 glucose tablets, OR glucose gel. Recheck blood sugar in 15 minutes after treatment (to make sure it is greater than 70 mg/dL). If your blood sugar is not greater than 70 mg/dL on recheck, call [phone] o  for further instructions. . Report your blood sugar to the short stay nurse when you get to Short Stay.  . If you are admitted to the hospital after surgery: o Your blood sugar will be checked by the staff and you will probably be given insulin after surgery (instead of oral diabetes medicines) to make sure you have good blood sugar levels. o The goal for blood sugar control after surgery is 80-180 mg/dL.   WHAT DO I DO ABOUT MY DIABETES MEDICATION?   . THE NIGHT BEFORE SURGERY, take ___________ units of ___________insulin.      . THE MORNING OF SURGERY, take _____________ units  of __________insulin.   . If your CBG is greater than 220 mg/dL, you may take  of your sliding scale (correction) dose of insulin.     Do not wear jewelry, make-up or nail polish.  Do not wear lotions, powders, or perfumes, or deodorant.  Do not shave 48 hours prior to surgery.  Men may shave face and neck.  Do not bring valuables to the hospital.  [facility] is not responsible for any belongings or valuables.  Hearing  aids, eyeglasses, contacts, dentures or bridgework may not be worn into surgery.  Leave your suitcase in the car.  After surgery it may be brought to your room.  For patients admitted to the hospital, discharge time will be determined by your treatment team.  Patients discharged the day of surgery will not be allowed to drive home.   Name and phone number of your driver:    Special instructions:   [facility]- Preparing For Surgery  Before surgery, you can play an important role. Because skin is not sterile, your skin needs to be as free of germs as possible. You can reduce the number of germs on your skin by washing with CHG (chlorahexidine gluconate) Soap before surgery.  CHG is an antiseptic cleaner which kills germs and bonds with the skin to continue killing germs even after washing.  Please do not use if you have an allergy to CHG or antibacterial soaps. If your skin becomes reddened/irritated stop using the CHG.  Do not shave (including legs and underarms) for at least 48 hours prior to first CHG shower. It is OK to shave your face.  Please follow these instructions carefully.   1. Shower the NIGHT BEFORE SURGERY and the MORNING OF SURGERY with CHG.   2. If you chose to wash your hair, wash your hair first as usual with your normal shampoo.  3. After you shampoo, rinse your hair and body thoroughly to remove the shampoo.  4. Use CHG as you would any other liquid soap. You can apply CHG directly to the skin and wash gently with a scrungie or a clean washcloth.   5. Apply the CHG Soap to your body ONLY FROM THE NECK DOWN.  Do not use on open wounds or open sores. Avoid contact with your eyes, ears, mouth and genitals (private parts). Wash Face and genitals (private parts)  with your normal soap.  6. Wash thoroughly, paying special attention to the area where your surgery will be performed.  7. Thoroughly rinse your body with warm water from the neck down.  8. DO NOT shower/wash  with your normal soap after using and rinsing off the CHG Soap.  9. Pat yourself dry with a CLEAN TOWEL.  10. Wear CLEAN PAJAMAS to bed the night before surgery, wear comfortable clothes the morning of surgery  11. Place CLEAN SHEETS on your bed the night of your first shower and DO NOT SLEEP WITH PETS.    Day of Surgery: Do not apply any deodorants/lotions. Please wear clean clothes to the hospital/surgery center.      Please read over the following fact sheets that you were given.

## 2017-07-13 ENCOUNTER — Encounter (HOSPITAL_COMMUNITY): Payer: Self-pay

## 2017-07-13 ENCOUNTER — Other Ambulatory Visit: Payer: Self-pay

## 2017-07-13 ENCOUNTER — Encounter (HOSPITAL_COMMUNITY)
Admission: RE | Admit: 2017-07-13 | Discharge: 2017-07-13 | Disposition: A | Discharge status: Home / Self Care | Payer: Medicare Other | Source: Ambulatory Visit | Attending: Orthopedic Surgery | Admitting: Orthopedic Surgery

## 2017-07-13 DIAGNOSIS — G4733 Obstructive sleep apnea (adult) (pediatric): Secondary | ICD-10-CM | POA: Insufficient documentation

## 2017-07-13 DIAGNOSIS — Z79899 Other long term (current) drug therapy: Secondary | ICD-10-CM | POA: Insufficient documentation

## 2017-07-13 DIAGNOSIS — E785 Hyperlipidemia, unspecified: Secondary | ICD-10-CM | POA: Insufficient documentation

## 2017-07-13 DIAGNOSIS — I10 Essential (primary) hypertension: Secondary | ICD-10-CM | POA: Diagnosis not present

## 2017-07-13 DIAGNOSIS — M25311 Other instability, right shoulder: Secondary | ICD-10-CM | POA: Diagnosis not present

## 2017-07-13 DIAGNOSIS — Z01818 Encounter for other preprocedural examination: Secondary | ICD-10-CM | POA: Insufficient documentation

## 2017-07-13 DIAGNOSIS — E1140 Type 2 diabetes mellitus with diabetic neuropathy, unspecified: Secondary | ICD-10-CM | POA: Diagnosis not present

## 2017-07-13 DIAGNOSIS — E039 Hypothyroidism, unspecified: Secondary | ICD-10-CM | POA: Insufficient documentation

## 2017-07-13 DIAGNOSIS — F329 Major depressive disorder, single episode, unspecified: Secondary | ICD-10-CM | POA: Insufficient documentation

## 2017-07-13 HISTORY — DX: Sciatica, unspecified side: M54.30

## 2017-07-13 HISTORY — DX: Depression, unspecified: F32.A

## 2017-07-13 HISTORY — DX: Personal history of urinary calculi: Z87.442

## 2017-07-13 HISTORY — DX: Polyneuropathy, unspecified: G62.9

## 2017-07-13 HISTORY — DX: Major depressive disorder, single episode, unspecified: F32.9

## 2017-07-13 HISTORY — DX: Fracture of neck, unspecified, initial encounter: S12.9XXA

## 2017-07-13 HISTORY — DX: Thyrotoxicosis with diffuse goiter without thyrotoxic crisis or storm: E05.00

## 2017-07-13 LAB — COMPREHENSIVE METABOLIC PANEL
ALT: 18 U/L (ref 14–54)
AST: 19 U/L (ref 15–41)
Albumin: 3.9 g/dL (ref 3.5–5.0)
Alkaline Phosphatase: 91 U/L (ref 38–126)
Anion gap: 10 (ref 5–15)
BUN: 22 mg/dL — ABNORMAL HIGH (ref 6–20)
CO2: 29 mmol/L (ref 22–32)
Calcium: 9.6 mg/dL (ref 8.9–10.3)
Chloride: 97 mmol/L — ABNORMAL LOW (ref 101–111)
Creatinine, Ser: 1.12 mg/dL — ABNORMAL HIGH (ref 0.44–1.00)
GFR calc Af Amer: 58 mL/min — ABNORMAL LOW (ref >60)
GFR calc non Af Amer: 50 mL/min — ABNORMAL LOW (ref >60)
Glucose, Bld: 184 mg/dL — ABNORMAL HIGH (ref 65–99)
Potassium: 3.9 mmol/L (ref 3.5–5.1)
Sodium: 136 mmol/L (ref 135–145)
Total Bilirubin: 0.6 mg/dL (ref 0.3–1.2)
Total Protein: 7 g/dL (ref 6.5–8.1)

## 2017-07-13 LAB — GLUCOSE, CAPILLARY: Glucose-Capillary: 203 mg/dL — ABNORMAL HIGH (ref 65–99)

## 2017-07-13 LAB — URINALYSIS, ROUTINE W REFLEX MICROSCOPIC
Bilirubin Urine: NEGATIVE
Glucose, UA: NEGATIVE mg/dL
Hgb urine dipstick: NEGATIVE
Ketones, ur: NEGATIVE mg/dL
Leukocytes, UA: NEGATIVE
Nitrite: NEGATIVE
Protein, ur: NEGATIVE mg/dL
Specific Gravity, Urine: 1.01 (ref 1.005–1.030)
pH: 6 (ref 5.0–8.0)

## 2017-07-13 LAB — CBC WITH DIFFERENTIAL/PLATELET
Basophils Absolute: 0 10*3/uL (ref 0.0–0.1)
Basophils Relative: 1 %
Eosinophils Absolute: 0 10*3/uL (ref 0.0–0.7)
Eosinophils Relative: 0 %
HCT: 36.3 % (ref 36.0–46.0)
Hemoglobin: 11.7 g/dL — ABNORMAL LOW (ref 12.0–15.0)
Lymphocytes Relative: 38 %
Lymphs Abs: 1 10*3/uL (ref 0.7–4.0)
MCH: 27.4 pg (ref 26.0–34.0)
MCHC: 32.2 g/dL (ref 30.0–36.0)
MCV: 85 fL (ref 78.0–100.0)
Monocytes Absolute: 0.2 10*3/uL (ref 0.1–1.0)
Monocytes Relative: 8 %
Neutro Abs: 1.5 10*3/uL — ABNORMAL LOW (ref 1.7–7.7)
Neutrophils Relative %: 53 %
Platelets: 158 10*3/uL (ref 150–400)
RBC: 4.27 MIL/uL (ref 3.87–5.11)
RDW: 14.1 % (ref 11.5–15.5)
WBC: 2.7 10*3/uL — ABNORMAL LOW (ref 4.0–10.5)

## 2017-07-13 LAB — SURGICAL PCR SCREEN
MRSA, PCR: NEGATIVE
Staphylococcus aureus: NEGATIVE

## 2017-07-13 LAB — TYPE AND SCREEN
ABO/RH(D): A NEG
Antibody Screen: NEGATIVE

## 2017-07-13 LAB — PROTIME-INR
INR: 1.05
Prothrombin Time: 13.6 seconds (ref 11.4–15.2)

## 2017-07-13 LAB — APTT: aPTT: 33 seconds (ref 24–36)

## 2017-07-13 LAB — HEMOGLOBIN A1C
Hgb A1c MFr Bld: 7.6 % — ABNORMAL HIGH (ref 4.8–5.6)
Mean Plasma Glucose: 171.42 mg/dL

## 2017-07-13 NOTE — Pre-Procedure Instructions (Signed)
Manuella Blackson Roane  07/13/2017      [facility] [place], [place] - [address] AT [address] [place] [address] Phone: [phone] Fax: [phone]    Your procedure is scheduled on 07/21/2017.  Report to [facility] Admitting at [place].M.  Call this number if you have problems the morning of surgery:  [phone]   Remember:  Do not eat food or drink liquids after midnight.   Continue all medications as directed by your physician except follow these medication instructions before surgery below   Take these medicines the morning of surgery with A SIP OF WATER: Albuterol inhaler - if needed (Bring with you to the hospital) Albuterol nebulizer - if needed Clonazepam (Klonopin) - if needed Cyclobenzaprine (Flexeril) - if needed Fluticasone (Flonase) Gabapentin (Neurontin) - if needed Hydrocodone-acetaminphen (Norco/Vicodin) - if needed levocetirizine (Xyzal) Metoprolol (Lopressor) Montelukast (Singular) - if needed Eye drops Ondansetron (Zofran) - if needed Propylthiouracil (PTU)  Ropinirole (Requip) - if needed  7 days prior to surgery STOP taking any Aspirin (unless otherwise instructed by your surgeon), Aleve, Naproxen, Ibuprofen, Motrin, Advil, Goody's, BC's, all herbal medications, fish oil, and all vitamins   How to Manage Your Diabetes Before and After Surgery  Why is it important to control my blood sugar before and after surgery? . Improving blood sugar levels before and after surgery helps healing and can limit problems. . A way of improving blood sugar control is eating a healthy diet by: o  Eating less sugar and carbohydrates o  Increasing activity/exercise o  Talking with your doctor about reaching your blood sugar goals . High blood sugars (greater than 180 mg/dL) can raise your risk of infections and slow your recovery, so you will need to focus on controlling  your diabetes during the weeks before surgery. . Make sure that the doctor who takes care of your diabetes knows about your planned surgery including the date and location.  How do I manage my blood sugar before surgery? . Check your blood sugar at least 4 times a day, starting 2 days before surgery, to make sure that the level is not too high or low. o Check your blood sugar the morning of your surgery when you wake up and every 2 hours until you get to the Short Stay unit. . If your blood sugar is less than 70 mg/dL, you will need to treat for low blood sugar: o Do not take insulin. o Treat a low blood sugar (less than 70 mg/dL) with  cup of clear juice (cranberry or apple), 4 glucose tablets, OR glucose gel. Recheck blood sugar in 15 minutes after treatment (to make sure it is greater than 70 mg/dL). If your blood sugar is not greater than 70 mg/dL on recheck, call [phone] o  for further instructions. . Report your blood sugar to the short stay nurse when you get to Short Stay.  . If you are admitted to the hospital after surgery: o Your blood sugar will be checked by the staff and you will probably be given insulin after surgery (instead of oral diabetes medicines) to make sure you have good blood sugar levels. o The goal for blood sugar control after surgery is 80-180 mg/dL.   WHAT DO I DO ABOUT MY DIABETES MEDICATION?   . THE NIGHT BEFORE SURGERY, take __HALF DOSE_________ units of ________LANTUS___insulin.       . If your CBG is greater  than 220 mg/dL, you may take  of your sliding scale (correction) dose of insulin.     Do not wear jewelry, make-up or nail polish.  Do not wear lotions, powders, or perfumes, or deodorant.  Do not shave 48 hours prior to surgery.  Men may shave face and neck.  Do not bring valuables to the hospital.  [facility] is not responsible for any belongings or valuables.  Hearing aids, eyeglasses, contacts, dentures or bridgework may not be  worn into surgery.  Leave your suitcase in the car.  After surgery it may be brought to your room.  For patients admitted to the hospital, discharge time will be determined by your treatment team.  Patients discharged the day of surgery will not be allowed to drive home.   Name and phone number of your driver:    Special instructions:   [facility]- Preparing For Surgery  Before surgery, you can play an important role. Because skin is not sterile, your skin needs to be as free of germs as possible. You can reduce the number of germs on your skin by washing with CHG (chlorahexidine gluconate) Soap before surgery.  CHG is an antiseptic cleaner which kills germs and bonds with the skin to continue killing germs even after washing.  Please do not use if you have an allergy to CHG or antibacterial soaps. If your skin becomes reddened/irritated stop using the CHG.  Do not shave (including legs and underarms) for at least 48 hours prior to first CHG shower. It is OK to shave your face.  Please follow these instructions carefully.   1. Shower the NIGHT BEFORE SURGERY and the MORNING OF SURGERY with CHG.   2. If you chose to wash your hair, wash your hair first as usual with your normal shampoo.  3. After you shampoo, rinse your hair and body thoroughly to remove the shampoo.  4. Use CHG as you would any other liquid soap. You can apply CHG directly to the skin and wash gently with a scrungie or a clean washcloth.   5. Apply the CHG Soap to your body ONLY FROM THE NECK DOWN.  Do not use on open wounds or open sores. Avoid contact with your eyes, ears, mouth and genitals (private parts). Wash Face and genitals (private parts)  with your normal soap.  6. Wash thoroughly, paying special attention to the area where your surgery will be performed.  7. Thoroughly rinse your body with warm water from the neck down.  8. DO NOT shower/wash with your normal soap after using and rinsing off the CHG  Soap.  9. Pat yourself dry with a CLEAN TOWEL.  10. Wear CLEAN PAJAMAS to bed the night before surgery, wear comfortable clothes the morning of surgery  11. Place CLEAN SHEETS on your bed the night of your first shower and DO NOT SLEEP WITH PETS.    Day of Surgery: Do not apply any deodorants/lotions. Please wear clean clothes to the hospital/surgery center.      Please read over the following fact sheets that you were given.
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
## 2017-07-15 ENCOUNTER — Other Ambulatory Visit: Payer: Self-pay | Admitting: Orthopedic Surgery

## 2017-07-15 NOTE — Care Plan (Signed)
I have spoken with patient and she plans to discharge to home with family. She has not HH or DME needs currently. She has an extensive health history and may need an HH eval. I will follow along to assess after surgery.   Please contact Ladell Heads, [place] with questions.   Thanks

## 2017-07-20 DIAGNOSIS — Z6841 Body Mass Index (BMI) 40.0 and over, adult: Secondary | ICD-10-CM | POA: Diagnosis not present

## 2017-07-20 DIAGNOSIS — Z0000 Encounter for general adult medical examination without abnormal findings: Secondary | ICD-10-CM | POA: Diagnosis not present

## 2017-07-20 DIAGNOSIS — Z1389 Encounter for screening for other disorder: Secondary | ICD-10-CM | POA: Diagnosis not present

## 2017-07-20 DIAGNOSIS — F338 Other recurrent depressive disorders: Secondary | ICD-10-CM | POA: Diagnosis not present

## 2017-07-20 DIAGNOSIS — M25511 Pain in right shoulder: Secondary | ICD-10-CM | POA: Diagnosis not present

## 2017-07-20 DIAGNOSIS — E0590 Thyrotoxicosis, unspecified without thyrotoxic crisis or storm: Secondary | ICD-10-CM | POA: Diagnosis not present

## 2017-07-20 DIAGNOSIS — G44309 Post-traumatic headache, unspecified, not intractable: Secondary | ICD-10-CM | POA: Diagnosis not present

## 2017-07-20 DIAGNOSIS — R2689 Other abnormalities of gait and mobility: Secondary | ICD-10-CM | POA: Diagnosis not present

## 2017-07-20 DIAGNOSIS — N183 Chronic kidney disease, stage 3 (moderate): Secondary | ICD-10-CM | POA: Diagnosis not present

## 2017-07-20 DIAGNOSIS — M24419 Recurrent dislocation, unspecified shoulder: Secondary | ICD-10-CM | POA: Diagnosis not present

## 2017-07-20 DIAGNOSIS — E1129 Type 2 diabetes mellitus with other diabetic kidney complication: Secondary | ICD-10-CM | POA: Diagnosis not present

## 2017-07-21 ENCOUNTER — Other Ambulatory Visit: Payer: Self-pay

## 2017-07-21 ENCOUNTER — Encounter (HOSPITAL_COMMUNITY)
Admission: RE | Disposition: A | Discharge status: Home / Self Care | Payer: Self-pay | Source: Ambulatory Visit | Attending: Orthopedic Surgery

## 2017-07-21 ENCOUNTER — Inpatient Hospital Stay (HOSPITAL_COMMUNITY): Payer: Medicare Other | Admitting: Anesthesiology

## 2017-07-21 ENCOUNTER — Encounter (HOSPITAL_COMMUNITY): Payer: Self-pay

## 2017-07-21 ENCOUNTER — Inpatient Hospital Stay (HOSPITAL_COMMUNITY): Payer: Medicare Other

## 2017-07-21 ENCOUNTER — Inpatient Hospital Stay (HOSPITAL_COMMUNITY): Payer: Medicare Other | Admitting: Vascular Surgery

## 2017-07-21 ENCOUNTER — Inpatient Hospital Stay (HOSPITAL_COMMUNITY)
Admission: RE | Admit: 2017-07-21 | Discharge: 2017-07-23 | DRG: 483 | Disposition: A | Discharge status: Home / Self Care | Payer: Medicare Other | Source: Ambulatory Visit | Attending: Orthopedic Surgery | Admitting: Orthopedic Surgery

## 2017-07-21 DIAGNOSIS — Z882 Allergy status to sulfonamides status: Secondary | ICD-10-CM | POA: Diagnosis not present

## 2017-07-21 DIAGNOSIS — Z885 Allergy status to narcotic agent status: Secondary | ICD-10-CM | POA: Diagnosis not present

## 2017-07-21 DIAGNOSIS — Z7951 Long term (current) use of inhaled steroids: Secondary | ICD-10-CM | POA: Diagnosis not present

## 2017-07-21 DIAGNOSIS — Z888 Allergy status to other drugs, medicaments and biological substances status: Secondary | ICD-10-CM

## 2017-07-21 DIAGNOSIS — M25311 Other instability, right shoulder: Secondary | ICD-10-CM | POA: Diagnosis not present

## 2017-07-21 DIAGNOSIS — Z96611 Presence of right artificial shoulder joint: Secondary | ICD-10-CM | POA: Diagnosis not present

## 2017-07-21 DIAGNOSIS — Z90710 Acquired absence of both cervix and uterus: Secondary | ICD-10-CM

## 2017-07-21 DIAGNOSIS — Z9842 Cataract extraction status, left eye: Secondary | ICD-10-CM | POA: Diagnosis not present

## 2017-07-21 DIAGNOSIS — Z886 Allergy status to analgesic agent status: Secondary | ICD-10-CM

## 2017-07-21 DIAGNOSIS — M24411 Recurrent dislocation, right shoulder: Secondary | ICD-10-CM | POA: Diagnosis not present

## 2017-07-21 DIAGNOSIS — S43014A Anterior dislocation of right humerus, initial encounter: Secondary | ICD-10-CM | POA: Diagnosis not present

## 2017-07-21 DIAGNOSIS — G8918 Other acute postprocedural pain: Secondary | ICD-10-CM | POA: Diagnosis not present

## 2017-07-21 DIAGNOSIS — Z961 Presence of intraocular lens: Secondary | ICD-10-CM | POA: Diagnosis not present

## 2017-07-21 DIAGNOSIS — Z471 Aftercare following joint replacement surgery: Secondary | ICD-10-CM | POA: Diagnosis not present

## 2017-07-21 DIAGNOSIS — E119 Type 2 diabetes mellitus without complications: Secondary | ICD-10-CM | POA: Diagnosis present

## 2017-07-21 DIAGNOSIS — Z6841 Body Mass Index (BMI) 40.0 and over, adult: Secondary | ICD-10-CM | POA: Diagnosis not present

## 2017-07-21 DIAGNOSIS — Z9841 Cataract extraction status, right eye: Secondary | ICD-10-CM | POA: Diagnosis not present

## 2017-07-21 DIAGNOSIS — Z96653 Presence of artificial knee joint, bilateral: Secondary | ICD-10-CM | POA: Diagnosis not present

## 2017-07-21 DIAGNOSIS — G4733 Obstructive sleep apnea (adult) (pediatric): Secondary | ICD-10-CM | POA: Diagnosis not present

## 2017-07-21 DIAGNOSIS — M75121 Complete rotator cuff tear or rupture of right shoulder, not specified as traumatic: Secondary | ICD-10-CM | POA: Diagnosis not present

## 2017-07-21 DIAGNOSIS — Z79899 Other long term (current) drug therapy: Secondary | ICD-10-CM | POA: Diagnosis not present

## 2017-07-21 DIAGNOSIS — Z794 Long term (current) use of insulin: Secondary | ICD-10-CM

## 2017-07-21 DIAGNOSIS — I10 Essential (primary) hypertension: Secondary | ICD-10-CM | POA: Diagnosis not present

## 2017-07-21 HISTORY — PX: REVERSE SHOULDER ARTHROPLASTY: SHX5054

## 2017-07-21 HISTORY — DX: Recurrent dislocation, right shoulder: M24.411

## 2017-07-21 LAB — GLUCOSE, CAPILLARY
Glucose-Capillary: 148 mg/dL — ABNORMAL HIGH (ref 65–99)
Glucose-Capillary: 127 mg/dL — ABNORMAL HIGH (ref 65–99)
Glucose-Capillary: 198 mg/dL — ABNORMAL HIGH (ref 65–99)
Glucose-Capillary: 293 mg/dL — ABNORMAL HIGH (ref 65–99)
Glucose-Capillary: 225 mg/dL — ABNORMAL HIGH (ref 65–99)

## 2017-07-21 SURGERY — ARTHROPLASTY, SHOULDER, TOTAL, REVERSE
Anesthesia: General | Site: Shoulder | Laterality: Right

## 2017-07-21 MED ORDER — ROPINIROLE HCL 1 MG PO TABS
2.0000 mg | ORAL_TABLET | Freq: Three times a day (TID) | ORAL | Status: DC
  Administered 2017-07-21 – 2017-07-23 (×6): 2 mg via ORAL
  Filled 2017-07-21 (×6): qty 2

## 2017-07-21 MED ORDER — CEFAZOLIN SODIUM-DEXTROSE 2-4 GM/100ML-% IV SOLN
INTRAVENOUS | Status: AC
  Filled 2017-07-21: qty 100

## 2017-07-21 MED ORDER — 0.9 % SODIUM CHLORIDE (POUR BTL) OPTIME
TOPICAL | Status: DC | PRN
  Administered 2017-07-21: 1000 mL

## 2017-07-21 MED ORDER — SODIUM CHLORIDE 0.9 % IV SOLN
INTRAVENOUS | Status: DC
  Administered 2017-07-21: 12:00:00 via INTRAVENOUS

## 2017-07-21 MED ORDER — FLUCONAZOLE 200 MG PO TABS
200.0000 mg | ORAL_TABLET | Freq: Every day | ORAL | Status: DC | PRN
  Filled 2017-07-21: qty 1

## 2017-07-21 MED ORDER — DEXAMETHASONE SODIUM PHOSPHATE 10 MG/ML IJ SOLN
INTRAMUSCULAR | Status: DC | PRN
  Administered 2017-07-21: 4 mg via INTRAVENOUS

## 2017-07-21 MED ORDER — LACTATED RINGERS IV SOLN
INTRAVENOUS | Status: DC | PRN
  Administered 2017-07-21: 07:00:00 via INTRAVENOUS

## 2017-07-21 MED ORDER — PHENOL 1.4 % MT LIQD: 1.0000 | OROMUCOSAL | Status: DC | PRN

## 2017-07-21 MED ORDER — ACETAMINOPHEN 500 MG PO TABS
1000.0000 mg | ORAL_TABLET | Freq: Four times a day (QID) | ORAL | Status: AC
  Administered 2017-07-21 – 2017-07-22 (×4): 1000 mg via ORAL
  Filled 2017-07-21 (×4): qty 2

## 2017-07-21 MED ORDER — ONDANSETRON HCL 4 MG/2ML IJ SOLN
4.0000 mg | Freq: Four times a day (QID) | INTRAMUSCULAR | Status: DC | PRN
  Administered 2017-07-22: 4 mg via INTRAVENOUS
  Filled 2017-07-21 (×3): qty 2

## 2017-07-21 MED ORDER — BISACODYL 5 MG PO TBEC: 5.0000 mg | DELAYED_RELEASE_TABLET | Freq: Every day | ORAL | Status: DC | PRN

## 2017-07-21 MED ORDER — CYCLOBENZAPRINE HCL 10 MG PO TABS: 10.0000 mg | ORAL_TABLET | ORAL | Status: DC

## 2017-07-21 MED ORDER — HYDROMORPHONE HCL 2 MG/ML IJ SOLN
0.5000 mg | INTRAMUSCULAR | Status: DC | PRN
  Administered 2017-07-22 (×4): 1 mg via INTRAVENOUS
  Filled 2017-07-21 (×4): qty 1

## 2017-07-21 MED ORDER — PROPOFOL 10 MG/ML IV BOLUS
INTRAVENOUS | Status: AC
  Filled 2017-07-21: qty 20

## 2017-07-21 MED ORDER — METOCLOPRAMIDE HCL 5 MG PO TABS: 5.0000 mg | ORAL_TABLET | Freq: Three times a day (TID) | ORAL | Status: DC | PRN

## 2017-07-21 MED ORDER — LORATADINE 10 MG PO TABS
10.0000 mg | ORAL_TABLET | Freq: Every day | ORAL | Status: DC
  Administered 2017-07-22 – 2017-07-23 (×2): 10 mg via ORAL
  Filled 2017-07-21 (×2): qty 1

## 2017-07-21 MED ORDER — POLYETHYLENE GLYCOL 3350 17 G PO PACK: 17.0000 g | PACK | Freq: Every day | ORAL | Status: DC | PRN

## 2017-07-21 MED ORDER — ONDANSETRON HCL 4 MG/2ML IJ SOLN
4.0000 mg | Freq: Once | INTRAMUSCULAR | Status: AC | PRN
  Administered 2017-07-21: 4 mg via INTRAVENOUS

## 2017-07-21 MED ORDER — ALUM & MAG HYDROXIDE-SIMETH 200-200-20 MG/5ML PO SUSP: 30.0000 mL | ORAL | Status: DC | PRN

## 2017-07-21 MED ORDER — CLONAZEPAM 0.5 MG PO TABS
0.5000 mg | ORAL_TABLET | Freq: Three times a day (TID) | ORAL | Status: DC | PRN
  Administered 2017-07-21 – 2017-07-22 (×2): 0.5 mg via ORAL
  Filled 2017-07-21 (×2): qty 1

## 2017-07-21 MED ORDER — FUROSEMIDE 20 MG PO TABS
20.0000 mg | ORAL_TABLET | Freq: Every day | ORAL | Status: DC
  Administered 2017-07-21 – 2017-07-23 (×3): 20 mg via ORAL
  Filled 2017-07-21 (×3): qty 1

## 2017-07-21 MED ORDER — CEFAZOLIN SODIUM-DEXTROSE 2-4 GM/100ML-% IV SOLN
2.0000 g | Freq: Four times a day (QID) | INTRAVENOUS | Status: AC
  Administered 2017-07-21 (×3): 2 g via INTRAVENOUS
  Filled 2017-07-21 (×3): qty 100

## 2017-07-21 MED ORDER — PANTOPRAZOLE SODIUM 40 MG PO TBEC
40.0000 mg | DELAYED_RELEASE_TABLET | Freq: Every day | ORAL | Status: DC
  Administered 2017-07-21 – 2017-07-23 (×3): 40 mg via ORAL
  Filled 2017-07-21 (×3): qty 1

## 2017-07-21 MED ORDER — LIDOCAINE 2% (20 MG/ML) 5 ML SYRINGE
INTRAMUSCULAR | Status: AC
  Filled 2017-07-21: qty 5

## 2017-07-21 MED ORDER — PROPYLTHIOURACIL 50 MG PO TABS
100.0000 mg | ORAL_TABLET | Freq: Three times a day (TID) | ORAL | Status: DC
  Administered 2017-07-21 – 2017-07-23 (×4): 100 mg via ORAL
  Filled 2017-07-21 (×8): qty 2

## 2017-07-21 MED ORDER — METOCLOPRAMIDE HCL 5 MG/ML IJ SOLN: 5.0000 mg | Freq: Three times a day (TID) | INTRAMUSCULAR | Status: DC | PRN

## 2017-07-21 MED ORDER — ONDANSETRON HCL 4 MG/2ML IJ SOLN
INTRAMUSCULAR | Status: AC
  Filled 2017-07-21: qty 2

## 2017-07-21 MED ORDER — SODIUM CHLORIDE 0.9 % IR SOLN
Status: DC | PRN
  Administered 2017-07-21: 3000 mL

## 2017-07-21 MED ORDER — FENTANYL CITRATE (PF) 100 MCG/2ML IJ SOLN
25.0000 ug | INTRAMUSCULAR | Status: DC | PRN
  Administered 2017-07-21: 50 ug via INTRAVENOUS

## 2017-07-21 MED ORDER — SUGAMMADEX SODIUM 200 MG/2ML IV SOLN
INTRAVENOUS | Status: AC
  Filled 2017-07-21: qty 2

## 2017-07-21 MED ORDER — MIDAZOLAM HCL 5 MG/5ML IJ SOLN
INTRAMUSCULAR | Status: DC | PRN
  Administered 2017-07-21: 1 mg via INTRAVENOUS

## 2017-07-21 MED ORDER — FLUOXETINE HCL 20 MG PO CAPS
60.0000 mg | ORAL_CAPSULE | Freq: Every day | ORAL | Status: DC
  Administered 2017-07-21 – 2017-07-22 (×2): 60 mg via ORAL
  Filled 2017-07-21 (×2): qty 3

## 2017-07-21 MED ORDER — FENTANYL CITRATE (PF) 100 MCG/2ML IJ SOLN
INTRAMUSCULAR | Status: AC
  Filled 2017-07-21: qty 2

## 2017-07-21 MED ORDER — DOXAZOSIN MESYLATE 2 MG PO TABS
2.0000 mg | ORAL_TABLET | Freq: Every day | ORAL | Status: DC
  Administered 2017-07-21 – 2017-07-22 (×2): 2 mg via ORAL
  Filled 2017-07-21 (×2): qty 1

## 2017-07-21 MED ORDER — DIPHENHYDRAMINE HCL 12.5 MG/5ML PO ELIX
12.5000 mg | ORAL_SOLUTION | ORAL | Status: DC | PRN
  Administered 2017-07-21 – 2017-07-22 (×3): 25 mg via ORAL
  Filled 2017-07-21 (×3): qty 10

## 2017-07-21 MED ORDER — ACETAMINOPHEN 325 MG PO TABS
325.0000 mg | ORAL_TABLET | Freq: Four times a day (QID) | ORAL | Status: DC | PRN
  Administered 2017-07-23: 650 mg via ORAL
  Filled 2017-07-21 (×2): qty 2

## 2017-07-21 MED ORDER — DEXTROSE 5 % IV SOLN
INTRAVENOUS | Status: DC | PRN
  Administered 2017-07-21: 20 ug/min via INTRAVENOUS

## 2017-07-21 MED ORDER — OLOPATADINE HCL 0.1 % OP SOLN
2.0000 [drp] | Freq: Two times a day (BID) | OPHTHALMIC | Status: DC | PRN
  Filled 2017-07-21: qty 5

## 2017-07-21 MED ORDER — CEFAZOLIN SODIUM-DEXTROSE 2-4 GM/100ML-% IV SOLN
2.0000 g | INTRAVENOUS | Status: AC
  Administered 2017-07-21: 2 g via INTRAVENOUS

## 2017-07-21 MED ORDER — OXYCODONE HCL 5 MG PO TABS
10.0000 mg | ORAL_TABLET | ORAL | Status: DC | PRN
  Administered 2017-07-22 (×3): 15 mg via ORAL
  Administered 2017-07-22 – 2017-07-23 (×2): 10 mg via ORAL
  Filled 2017-07-21 (×3): qty 3
  Filled 2017-07-21: qty 2

## 2017-07-21 MED ORDER — POVIDONE-IODINE 7.5 % EX SOLN
Freq: Once | CUTANEOUS | Status: DC
  Filled 2017-07-21: qty 118

## 2017-07-21 MED ORDER — CLONAZEPAM 0.5 MG PO TABS: 0.5000 mg | ORAL_TABLET | ORAL | Status: DC

## 2017-07-21 MED ORDER — ALBUTEROL SULFATE (2.5 MG/3ML) 0.083% IN NEBU
2.5000 mg | INHALATION_SOLUTION | Freq: Four times a day (QID) | RESPIRATORY_TRACT | Status: DC | PRN
Start: 2017-07-21 — End: 2017-07-23

## 2017-07-21 MED ORDER — GABAPENTIN 100 MG PO CAPS
100.0000 mg | ORAL_CAPSULE | Freq: Three times a day (TID) | ORAL | Status: DC | PRN
  Administered 2017-07-21 – 2017-07-22 (×2): 100 mg via ORAL
  Filled 2017-07-21 (×2): qty 1

## 2017-07-21 MED ORDER — FENTANYL CITRATE (PF) 250 MCG/5ML IJ SOLN
INTRAMUSCULAR | Status: AC
  Filled 2017-07-21: qty 5

## 2017-07-21 MED ORDER — DEXAMETHASONE SODIUM PHOSPHATE 10 MG/ML IJ SOLN
INTRAMUSCULAR | Status: AC
  Filled 2017-07-21: qty 1

## 2017-07-21 MED ORDER — ROCURONIUM BROMIDE 100 MG/10ML IV SOLN
INTRAVENOUS | Status: DC | PRN
  Administered 2017-07-21: 50 mg via INTRAVENOUS
  Administered 2017-07-21: 20 mg via INTRAVENOUS

## 2017-07-21 MED ORDER — FUROSEMIDE 40 MG PO TABS
40.0000 mg | ORAL_TABLET | Freq: Every day | ORAL | Status: DC
  Administered 2017-07-22 – 2017-07-23 (×2): 40 mg via ORAL
  Filled 2017-07-21 (×2): qty 1

## 2017-07-21 MED ORDER — ASPIRIN EC 325 MG PO TBEC
325.0000 mg | DELAYED_RELEASE_TABLET | Freq: Two times a day (BID) | ORAL | Status: DC
  Administered 2017-07-21 – 2017-07-23 (×5): 325 mg via ORAL
  Filled 2017-07-21 (×5): qty 1

## 2017-07-21 MED ORDER — FENTANYL CITRATE (PF) 100 MCG/2ML IJ SOLN
INTRAMUSCULAR | Status: DC | PRN
  Administered 2017-07-21: 50 ug via INTRAVENOUS

## 2017-07-21 MED ORDER — ONDANSETRON HCL 4 MG/2ML IJ SOLN
INTRAMUSCULAR | Status: DC | PRN
  Administered 2017-07-21: 4 mg via INTRAVENOUS

## 2017-07-21 MED ORDER — LEVOCETIRIZINE DIHYDROCHLORIDE 5 MG PO TABS
5.0000 mg | ORAL_TABLET | Freq: Every day | ORAL | Status: DC
  Filled 2017-07-21: qty 1

## 2017-07-21 MED ORDER — METOPROLOL TARTRATE 50 MG PO TABS: 50.0000 mg | ORAL_TABLET | Freq: Three times a day (TID) | ORAL | Status: DC

## 2017-07-21 MED ORDER — TRANEXAMIC ACID 1000 MG/10ML IV SOLN
1000.0000 mg | INTRAVENOUS | Status: AC
  Administered 2017-07-21: 1000 mg via INTRAVENOUS
  Filled 2017-07-21: qty 1100

## 2017-07-21 MED ORDER — ONDANSETRON HCL 4 MG PO TABS
4.0000 mg | ORAL_TABLET | Freq: Four times a day (QID) | ORAL | Status: DC | PRN
  Administered 2017-07-21 (×2): 4 mg via ORAL
  Filled 2017-07-21 (×2): qty 1

## 2017-07-21 MED ORDER — POTASSIUM CHLORIDE CRYS ER 20 MEQ PO TBCR
20.0000 meq | EXTENDED_RELEASE_TABLET | Freq: Every day | ORAL | Status: DC
  Administered 2017-07-21 – 2017-07-23 (×3): 20 meq via ORAL
  Filled 2017-07-21 (×3): qty 1

## 2017-07-21 MED ORDER — OLOPATADINE HCL 0.1 % OP SOLN: 2.0000 [drp] | OPHTHALMIC | Status: DC

## 2017-07-21 MED ORDER — CYCLOBENZAPRINE HCL 10 MG PO TABS
10.0000 mg | ORAL_TABLET | Freq: Two times a day (BID) | ORAL | Status: DC | PRN
  Administered 2017-07-22 (×2): 10 mg via ORAL
  Filled 2017-07-21 (×2): qty 1

## 2017-07-21 MED ORDER — SUGAMMADEX SODIUM 200 MG/2ML IV SOLN
INTRAVENOUS | Status: DC | PRN
  Administered 2017-07-21: 25 mg via INTRAVENOUS
  Administered 2017-07-21: 200 mg via INTRAVENOUS

## 2017-07-21 MED ORDER — LIDOCAINE HCL (CARDIAC) PF 100 MG/5ML IV SOSY
PREFILLED_SYRINGE | INTRAVENOUS | Status: DC | PRN
  Administered 2017-07-21: 40 mg via INTRAVENOUS

## 2017-07-21 MED ORDER — MIDAZOLAM HCL 2 MG/2ML IJ SOLN
INTRAMUSCULAR | Status: AC
  Filled 2017-07-21: qty 2

## 2017-07-21 MED ORDER — METOPROLOL TARTRATE 50 MG PO TABS
50.0000 mg | ORAL_TABLET | Freq: Three times a day (TID) | ORAL | Status: DC
  Administered 2017-07-21 – 2017-07-23 (×7): 50 mg via ORAL
  Filled 2017-07-21 (×7): qty 1

## 2017-07-21 MED ORDER — AMLODIPINE BESYLATE 5 MG PO TABS
5.0000 mg | ORAL_TABLET | Freq: Every day | ORAL | Status: DC
  Administered 2017-07-21 – 2017-07-22 (×2): 5 mg via ORAL
  Filled 2017-07-21 (×2): qty 1

## 2017-07-21 MED ORDER — FUROSEMIDE 20 MG PO TABS: 20.0000 mg | ORAL_TABLET | ORAL | Status: DC

## 2017-07-21 MED ORDER — FLEET ENEMA 7-19 GM/118ML RE ENEM: 1.0000 | ENEMA | Freq: Once | RECTAL | Status: DC | PRN

## 2017-07-21 MED ORDER — PROPOFOL 10 MG/ML IV BOLUS
INTRAVENOUS | Status: DC | PRN
  Administered 2017-07-21: 130 mg via INTRAVENOUS

## 2017-07-21 MED ORDER — MONTELUKAST SODIUM 10 MG PO TABS: 10.0000 mg | ORAL_TABLET | Freq: Every day | ORAL | Status: DC | PRN

## 2017-07-21 MED ORDER — MENTHOL 3 MG MT LOZG: 1.0000 | LOZENGE | OROMUCOSAL | Status: DC | PRN

## 2017-07-21 MED ORDER — DOCUSATE SODIUM 100 MG PO CAPS
100.0000 mg | ORAL_CAPSULE | Freq: Two times a day (BID) | ORAL | Status: DC
  Administered 2017-07-21 – 2017-07-23 (×5): 100 mg via ORAL
  Filled 2017-07-21 (×5): qty 1

## 2017-07-21 MED ORDER — FLUTICASONE PROPIONATE 50 MCG/ACT NA SUSP
1.0000 | Freq: Every day | NASAL | Status: DC
  Administered 2017-07-22: 1 via NASAL
  Filled 2017-07-21: qty 16

## 2017-07-21 MED ORDER — OXYCODONE HCL 5 MG PO TABS
5.0000 mg | ORAL_TABLET | ORAL | Status: DC | PRN
  Administered 2017-07-21 (×2): 10 mg via ORAL
  Filled 2017-07-21 (×3): qty 2

## 2017-07-21 MED ORDER — ROCURONIUM BROMIDE 10 MG/ML (PF) SYRINGE
PREFILLED_SYRINGE | INTRAVENOUS | Status: AC
  Filled 2017-07-21: qty 5

## 2017-07-21 MED ORDER — INSULIN ASPART 100 UNIT/ML ~~LOC~~ SOLN
0.0000 [IU] | Freq: Three times a day (TID) | SUBCUTANEOUS | Status: DC
  Administered 2017-07-21: 8 [IU] via SUBCUTANEOUS
  Administered 2017-07-21 – 2017-07-22 (×3): 3 [IU] via SUBCUTANEOUS
  Administered 2017-07-22: 5 [IU] via SUBCUTANEOUS
  Administered 2017-07-23 (×2): 3 [IU] via SUBCUTANEOUS

## 2017-07-21 SURGICAL SUPPLY — 72 items
BASEPLATE P2 COATD GLND 6.5X30 (Shoulder) ×1 IMPLANT
BIT DRILL 5/64X5 DISP (BIT) IMPLANT
BLADE SAW SAG 73X25 THK (BLADE) ×2
BLADE SAW SGTL 73X25 THK (BLADE) ×1 IMPLANT
BLADE SURG 15 STRL LF DISP TIS (BLADE) IMPLANT
BLADE SURG 15 STRL SS (BLADE)
BOWL SMART MIX CTS (DISPOSABLE) IMPLANT
CHLORAPREP W/TINT 26ML (MISCELLANEOUS) ×3 IMPLANT
CLOSURE WOUND 1/2 X4 (GAUZE/BANDAGES/DRESSINGS) ×2
COVER MAYO STAND STRL (DRAPES) IMPLANT
COVER SURGICAL LIGHT HANDLE (MISCELLANEOUS) ×3 IMPLANT
DRAPE INCISE IOBAN 66X45 STRL (DRAPES) ×3 IMPLANT
DRAPE ORTHO SPLIT 77X108 STRL (DRAPES) ×4
DRAPE SURG ORHT 6 SPLT 77X108 (DRAPES) ×2 IMPLANT
DRSG AQUACEL AG ADV 3.5X10 (GAUZE/BANDAGES/DRESSINGS) ×3 IMPLANT
ELECT BLADE 4.0 EZ CLEAN MEGAD (MISCELLANEOUS) ×3
ELECT REM PT RETURN 9FT ADLT (ELECTROSURGICAL) ×3
ELECTRODE BLDE 4.0 EZ CLN MEGD (MISCELLANEOUS) ×1 IMPLANT
ELECTRODE REM PT RTRN 9FT ADLT (ELECTROSURGICAL) ×1 IMPLANT
EVACUATOR 1/8 PVC DRAIN (DRAIN) IMPLANT
GLOVE BIO SURGEON STRL SZ7 (GLOVE) ×3 IMPLANT
GLOVE BIO SURGEON STRL SZ7.5 (GLOVE) ×3 IMPLANT
GLOVE BIOGEL PI IND STRL 7.0 (GLOVE) ×1 IMPLANT
GLOVE BIOGEL PI IND STRL 8 (GLOVE) ×1 IMPLANT
GLOVE BIOGEL PI INDICATOR 7.0 (GLOVE) ×2
GLOVE BIOGEL PI INDICATOR 8 (GLOVE) ×2
GOWN STRL REUS W/ TWL LRG LVL3 (GOWN DISPOSABLE) ×3 IMPLANT
GOWN STRL REUS W/ TWL XL LVL3 (GOWN DISPOSABLE) ×1 IMPLANT
GOWN STRL REUS W/TWL LRG LVL3 (GOWN DISPOSABLE) ×6
GOWN STRL REUS W/TWL XL LVL3 (GOWN DISPOSABLE) ×2
HANDPIECE INTERPULSE COAX TIP (DISPOSABLE) ×2
HOOD PEEL AWAY FLYTE STAYCOOL (MISCELLANEOUS) ×6 IMPLANT
INSERT HUMERAL SM SOCKET 32 +4 (Joint) ×3 IMPLANT
KIT BASIN OR (CUSTOM PROCEDURE TRAY) ×3 IMPLANT
KIT TURNOVER KIT B (KITS) ×3 IMPLANT
MANIFOLD NEPTUNE II (INSTRUMENTS) ×3 IMPLANT
NEEDLE MAYO TROCAR (NEEDLE) IMPLANT
NS IRRIG 1000ML POUR BTL (IV SOLUTION) ×3 IMPLANT
P2 COATDE GLNOID BSEPLT 6.5X30 (Shoulder) ×3 IMPLANT
PACK SHOULDER (CUSTOM PROCEDURE TRAY) ×3 IMPLANT
PAD ARMBOARD 7.5X6 YLW CONV (MISCELLANEOUS) ×6 IMPLANT
RESTRAINT HEAD UNIVERSAL NS (MISCELLANEOUS) ×3 IMPLANT
RETRIEVER SUT HEWSON (MISCELLANEOUS) IMPLANT
SCREW BONE LOCKING RSP 5.0X14 (Screw) ×3 IMPLANT
SCREW BONE RSP LOCK 5X14 (Screw) ×1 IMPLANT
SCREW BONE RSP LOCK 5X18 (Screw) ×1 IMPLANT
SCREW BONE RSP LOCK 5X26 (Screw) ×2 IMPLANT
SCREW BONE RSP LOCKING 18MM LG (Screw) ×3 IMPLANT
SCREW BONE RSP LOCKING 5.0X26 (Screw) ×6 IMPLANT
SCREW RETAIN W/HEAD 4MM OFFSET (Shoulder) ×3 IMPLANT
SET HNDPC FAN SPRY TIP SCT (DISPOSABLE) ×1 IMPLANT
SLING ARM FOAM STRAP LRG (SOFTGOODS) ×3 IMPLANT
SLING ARM FOAM STRAP MED (SOFTGOODS) IMPLANT
SPONGE LAP 18X18 X RAY DECT (DISPOSABLE) IMPLANT
SPONGE LAP 4X18 X RAY DECT (DISPOSABLE) IMPLANT
STEM HUMERAL REV SHL 6X108 SM (Stem) ×3 IMPLANT
STRIP CLOSURE SKIN 1/2X4 (GAUZE/BANDAGES/DRESSINGS) ×4 IMPLANT
SUCTION FRAZIER HANDLE 10FR (MISCELLANEOUS) ×2
SUCTION TUBE FRAZIER 10FR DISP (MISCELLANEOUS) ×1 IMPLANT
SUPPORT WRAP ARM LG (MISCELLANEOUS) ×3 IMPLANT
SUT ETHIBOND NAB CT1 #1 30IN (SUTURE) ×3 IMPLANT
SUT FIBERWIRE #2 38 T-5 BLUE (SUTURE)
SUT MNCRL AB 4-0 PS2 18 (SUTURE) ×3 IMPLANT
SUT SILK 2 0 TIES 17X18 (SUTURE)
SUT SILK 2-0 18XBRD TIE BLK (SUTURE) IMPLANT
SUT VIC AB 2-0 CT1 27 (SUTURE) ×2
SUT VIC AB 2-0 CT1 TAPERPNT 27 (SUTURE) ×1 IMPLANT
SUTURE FIBERWR #2 38 T-5 BLUE (SUTURE) IMPLANT
SYR 30ML LL (SYRINGE) IMPLANT
TOWEL OR 17X26 10 PK STRL BLUE (TOWEL DISPOSABLE) ×3 IMPLANT
WATER STERILE IRR 1000ML POUR (IV SOLUTION) IMPLANT
YANKAUER SUCT BULB TIP NO VENT (SUCTIONS) IMPLANT

## 2017-07-21 NOTE — Transfer of Care (Signed)
Immediate Anesthesia Transfer of Care Note  Patient: [name]  Procedure(s) Performed: RIGHT REVERSE SHOULDER ARTHROPLASTY (Right Shoulder)  Patient Location: PACU  Anesthesia Type:General  Level of Consciousness: awake, oriented and patient cooperative  Airway & Oxygen Therapy: Patient Spontanous Breathing and Patient connected to face mask oxygen  Post-op Assessment: Report given to RN and Post -op Vital signs reviewed and stable  Post vital signs: Reviewed  Last Vitals:  Vitals Value Taken Time  BP 167/69 07/21/2017 10:01 AM  Temp    Pulse 80 07/21/2017 10:01 AM  Resp 13 07/21/2017 10:01 AM  SpO2 99 % 07/21/2017 10:01 AM  Vitals shown include unvalidated device data.  Last Pain:  Vitals:   07/21/17 0608  TempSrc:   PainSc: 8          Complications: No apparent anesthesia complications

## 2017-07-21 NOTE — Anesthesia Procedure Notes (Signed)
Procedure Name: Intubation Date/Time: 07/21/2017 7:41 AM Performed by: Jenne Campus, CRNA Pre-anesthesia Checklist: Patient identified, Emergency Drugs available, Suction available and Patient being monitored Patient Re-evaluated:Patient Re-evaluated prior to induction Oxygen Delivery Method: Circle System Utilized Preoxygenation: Pre-oxygenation with 100% oxygen Induction Type: IV induction Ventilation: Mask ventilation without difficulty Laryngoscope Size: Glidescope and 3 Grade View: Grade I Tube type: Oral Tube size: 7.5 mm Number of attempts: 1 Airway Equipment and Method: Stylet,  Oral airway and Video-laryngoscopy Placement Confirmation: ETT inserted through vocal cords under direct vision,  positive ETCO2 and breath sounds checked- equal and bilateral Secured at: 21 cm Tube secured with: Tape Dental Injury: Teeth and Oropharynx as per pre-operative assessment  Difficulty Due To: Difficulty was anticipated, Difficult Airway- due to large tongue, Difficult Airway- due to reduced neck mobility and Difficult Airway- due to limited oral opening

## 2017-07-21 NOTE — Op Note (Signed)
Procedure(s): RIGHT REVERSE SHOULDER ARTHROPLASTY Procedure Note  NERIAH BROTT female 67 y.o. 07/21/2017  Procedure(s) and Anesthesia Type:    * RIGHT REVERSE SHOULDER ARTHROPLASTY - General  Postoperative diagnosis: Chronic right shoulder anterior dislocation with significant glenoid bone loss and complete rotator cuff tear   Indications:  67 y.o. female  With chronic right shoulder anterior dislocation with glenoid bone loss.  She has significant associated medical comorbidities including morbid obesity, chronic infections, diabetes which is poorly controlled and frequent falls.  She initially attempted to live with the shoulder in a dislocated position to try to avoid complications of surgical management but was unable to do that secondary to worsening pain and dysfunction.  Ultimately she wanted to go forward with reverse total shoulder arthroplasty to try and stabilize her shoulder decreased pain and hopefully increase function.  She understood the significant risks associated with surgery including but not limited to the risk of infection, dislocation, fracture, incomplete pain relief, and damage to neurovascular structures.  She understands that in her case with a chronic dislocation and her medical comorbidities all of these risks are increased.     Surgeon: Isabella Stalling   Assistants: Jeanmarie Hubert PA-C [facility] was present and scrubbed throughout the procedure and was essential in positioning, retraction, exposure, and closure)  Anesthesia: General endotracheal anesthesia    Procedure Detail  RIGHT REVERSE SHOULDER ARTHROPLASTY   Estimated Blood Loss:  200 mL         Drains: none  Blood Given: none          Specimens: none        Complications:  * No complications entered in OR log *         Disposition: PACU - hemodynamically stable.         Condition: stable      OPERATIVE FINDINGS:  A DJO Altivate pressfit reverse total shoulder arthroplasty was  placed with a  size 6 stem, a 32-4 glenosphere, and a +4-mm constrained poly insert.  She had significant glenoid bone loss with only about 40% of the native glenoid remaining and significant anterior bone loss.  We had to use a cut of the humeral head to rebuild the anterior glenoid under the baseplate.  The base plate  fixation was excellent.  The baseplate had to be placed in more anteversion and than usual secondary to the significant bone loss.  PROCEDURE: The patient was identified in the preoperative holding area  where I personally marked the operative site after verifying site, side,  and procedure with the patient. An interscalene block given by  the attending anesthesiologist in the holding area and the patient was taken back to the operating room where all extremities were  carefully padded in position after general anesthesia was induced. She  was placed in a beach-chair position and the operative upper extremity was  prepped and draped in a standard sterile fashion. An approximately 10-  cm incision was made from the tip of the coracoid process to the center  point of the humerus at the level of the axilla. Dissection was carried  down through subcutaneous tissues to the level of the cephalic vein  which was taken laterally with the deltoid. The pectoralis major was  retracted medially. The subdeltoid space was developed and the lateral  edge of the conjoined tendon was identified.  The humerus was noted to be beneath the conjoined tendon beneath the coracoid.  This plane was carefully developed.  Above the humeral  head the supraspinatus was noted to be torn.  This plane was developed down around anterior bringing down the capsule to the inferior humeral head.  A Cobb elevator was then placed over the medial aspect of the humeral articular surface to try and help dislocate the humerus out from underneath the coracoid.  Ultimately after freeing up from surrounding soft tissues and  releasing some of the remaining posterior rotator cuff was able to dislocate the humeral head.  Her significant obesity also made this very challenging.  The excessive osteophyte formation was removed with a  large rongeur.  The cutting guide was used to make the appropriate  head cut and the head was saved for potentially bone grafting.  The glenoid was exposed with the arm in an  abducted extended position.  Upon examination of the glenoid she was noted to have significantly more bone loss than expected based on her preoperative CT scan.  About 50% of the glenoid bone surface was sheared off anteriorly. I felt that the remaining amount of glenoid would ultimately be adequate to hold the implant but not without bone grafting.  Therefore a Lime wedge type segment of the cut humeral head was fit to the defect and both surfaces were scraped down to promote healing together.  This bone graft was pinned in place anteriorly. The 2.5 mm drill was  placed using the guide in 5-10 inferior angulation and the tap was then advanced in the same hole. The tap was then removed and the Metaglene was then screwed in with excellent purchase.  The peripheral guide was then used to drill measured and filled peripheral locking screws. The size 32-4 glenosphere was then impacted on the [facility] taper and the central screw was placed. The humerus was then again exposed and the diaphyseal reamers were used followed by the metaphyseal reamers. The final broach was left in place in the proximal trial was placed. The joint was reduced and with this implant it was felt that soft tissue tensioning was appropriate with excellent stability and excellent range of motion. Therefore, final humeral stem was placed press fit with bone grafting.  And then the trial polyethylene inserts were tested again and the above implant was felt to be the most appropriate for final insertion.  I did find the +3 constrained implant to be the most stable.   Her position of greatest instability was with the arm abducted and extended behind her.   Soft tissue tension was appropriate.  The joint was then copiously irrigated with pulse  lavage and the wound was then closed. The subscapularis was not repaired.  Skin was closed with 2-0 Vicryl in a deep dermal layer and 4-0  Monocryl for skin closure. Steri-Strips were applied. Sterile  dressings were then applied as well as a sling. The patient was allowed  to awaken from general anesthesia, transferred to stretcher, and taken  to recovery room in stable condition.   POSTOPERATIVE PLAN: The patient will be kept in the hospital postoperatively  for pain control and therapy.

## 2017-07-21 NOTE — Anesthesia Procedure Notes (Signed)
Anesthesia Regional Block: Interscalene brachial plexus block   Pre-Anesthetic Checklist: ,, timeout performed, Correct Patient, Correct Site, Correct Laterality, Correct Procedure, Correct Position, site marked, Risks and benefits discussed,  Surgical consent,  Pre-op evaluation,  At surgeon's request and post-op pain management   Prep: chloraprep       Needles:  Injection technique: Single-shot  Needle Type: Stimulator Needle - 40      Needle Gauge: 22     Additional Needles:   Procedures:, nerve stimulator,,,,,,,  Narrative:  Start time: 07/21/2017 7:10 AM End time: 07/21/2017 7:15 AM Injection made incrementally with aspirations every 5 mL.  Performed by: Personally  Anesthesiologist: Roberts Gaudy, MD  Additional Notes: 20 cc 0.5% bupivacaine and 10 cc exparel injected easily

## 2017-07-21 NOTE — Plan of Care (Signed)
  Problem: Pain Management: Goal: Pain level will decrease with appropriate interventions Outcome: Progressing   

## 2017-07-21 NOTE — Anesthesia Postprocedure Evaluation (Signed)
Anesthesia Post Note  Patient: [name]  Procedure(s) Performed: RIGHT REVERSE SHOULDER ARTHROPLASTY (Right Shoulder)     Patient location during evaluation: PACU Anesthesia Type: General and Regional Level of consciousness: awake, awake and alert and oriented Pain management: pain level controlled Vital Signs Assessment: post-procedure vital signs reviewed and stable Respiratory status: spontaneous breathing, nonlabored ventilation, respiratory function stable and patient connected to nasal cannula oxygen Cardiovascular status: blood pressure returned to baseline and stable Postop Assessment: no apparent nausea or vomiting Anesthetic complications: no    Last Vitals:  Vitals:   07/21/17 1115 07/21/17 1120  BP:  (!) 161/74  Pulse:  87  Resp:  15  Temp: (!) 36.1 C (!) 36.4 C  SpO2:  94%    Last Pain:  Vitals:   07/21/17 1142  TempSrc:   PainSc: 0-No pain                 Kshawn Canal COKER

## 2017-07-21 NOTE — H&P (Signed)
[name] is an 67 y.o. female.   Chief Complaint: R shoulder pain and dysfunction HPI: Chronic R shoulder pain and dysfunction after chronic R shoulder fracture dislocation. Unable to perform daily activities and worsening pain.  Past Medical History:  Diagnosis Date  . Anemia   . Arthritis   . Asthma    OCCAS  . Bell's palsy   . Broken neck ([place])    C-1  . Carbuncle and furuncle of trunk   . Chronic dislocation of right shoulder   . Depression   . Disorder of fascia    HX OF NECROTIC FASCITIS AFTER ABDOMINAL SURGERY FOR HERNIA- REQUIRED 19 SURGERIES AND 2.5 MONTH HOSPITALIZATION AT [facility]  . DM (diabetes mellitus) ([place])   . Dysrhythmia    HX OF TACHYCARDIA AND BRADYCARDIA - ON GOING FOR YEARS - DOES NOT HAVE TO SEE CARDIOLOGIST  . Elevated cholesterol   . Fibromyalgia   . Fracture MAY 2014   HX OF FRACTURED NECK C1- CAUSES SEVERE HEADACHES--LIMITED ROM NECK  . Frequent infections    ESPECIALLY PRONE TO INFECTIONS AFTER SURGERIES  . Graves disease   . Heart murmur    DOES NOT CAUSE ANY PROBLEMS  . History of kidney stones   . Hyperlipidemia   . Hypertension   . Hypothyroidism    GRAVES DISEASE  . Migraine   . 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 Neuropathy   . Obesity   . OSA (obstructive sleep apnea)    USES CPAP - DOES NOT KNOW SETTING  . Pain    LEFT SHOULDER  PAIN -HARD TO LIE ON LEFT SIDE FOR LONG PERIOD;  PAIN IN LOWER BADK - 3 HERNIATED DISCS AND STENOISIS -  . PONV (postoperative nausea and vomiting)    THE GAS MAKES ME NAUSEATED  . Restless leg syndrome   . Sciatica   . Tachycardia   . Urinary frequency   . Urticaria   . UTI (urinary tract infection)     Past Surgical History:  Procedure Laterality Date  . ABDOMINAL HYSTERECTOMY     LARGE TUMOR AT OVARY REMOVED  . APPENDECTOMY    . CARPAL TUNNEL RELEASE     BILATERAL  . CATARACT EXTRACTION W/ INTRAOCULAR LENS  IMPLANT, BILATERAL    . CESAREAN SECTION     X 3  .  CHOLECYSTECTOMY    . CYSTOSCOPY WITH URETEROSCOPY Right 12/03/2013   Procedure: CYSTOSCOPY WITH RIGHT RETROGRADE URETEROSCOPY LASER LITHOTRIPSY RIGHT STONE RIGHT URETERAL STENT, ;  Surgeon: Raynelle Bring, MD;  Location: [facility];  Service: Urology;  Laterality: Right;  PROCEDURE WAS ORIGINALLY SCHEDULED AS RIGHT PERCUTANEOUS NEPHROLITHOTOMY  . EYELID LACERATION REPAIR     RIGHT EYE  . HERNIA REPAIR     ABDOMINAL HERNIA REPAIR WITH MESH - 3 SURGERIES   . HX OF 19 SURGERIES FOR NECROTIC FASCITIS     SKIN GRAFTS+ WOUND  VAC  . I&D OF INFECTED SITE IN [place] - FROM AN INJECTION    . JOINT REPLACEMENT     TOTAL RIGHT KNEE REPLACEMENT  . KNEE ARTHROSCOPY  RIGHT AND LEFT   X 2  . [place] RESECTION  2014  . NEPHROLITHOTOMY Right 12/10/2013   Procedure: NEPHROLITHOTOMY PERCUTANEOUS;  Surgeon: Raynelle Bring, MD;  Location: [facility];  Service: Urology;  Laterality: Right;  . RIGHT FOOT DRAINAGE OF INFECTION    . shoulder arthroscopy Right    X 2  .  TONSILLECTOMY     AND ADENOIDECTOMY    Family History  Problem Relation Age of Onset  . Diabetes Father   . Osteoarthritis Father   . Heart disease Father   . Ulcers Father   . Stroke Mother    Social History:  reports that she has never smoked. She has never used smokeless tobacco. She reports that she does not drink alcohol or use drugs.  Allergies:  Allergies  Allergen Reactions  . Imitrex [Sumatriptan] Other (See Comments)    Vascular spasms  . Morphine Other (See Comments)    Severe headaches   . Nsaids Other (See Comments)    PT UNABLE TO TOLERATE NSAID'S DRUGS DUE TO HX OF GASTRIC SLEEVE SURGERY  . Codeine Nausea Only  . Promethazine Hcl Other (See Comments)    Restless leg feeling all over body  . Rosuvastatin Calcium Other (See Comments)    Leg/muscle pain  . Statins Other (See Comments)    Leg/muscle pain  . Sulfonamide Derivatives Hives    Childhood allergy   . Vicodin [Hydrocodone-Acetaminophen] Nausea  And Vomiting    Medications Prior to Admission  Medication Sig Dispense Refill  . amLODipine (NORVASC) 5 MG tablet Take 5 mg by mouth at bedtime.   1  . clonazePAM (KLONOPIN) 0.5 MG tablet Take 0.5 mg by mouth See admin instructions. Up to 3 times daily as needed for anxiety, take 1 tablet (0.5 mg) scheduled each night at bedtime.  0  . cyclobenzaprine (FLEXERIL) 10 MG tablet Take 10 mg by mouth See admin instructions. Take 1 tablet (10 mg) every night at bedtime, may take an additional tablet during the day as needed for muscle spasms  0  . doxazosin (CARDURA) 2 MG tablet Take 2 mg by mouth at bedtime.   1  . fenofibrate 54 MG tablet Take 54 mg by mouth daily with supper.  2  . fluconazole (DIFLUCAN) 200 MG tablet Take 200 mg by mouth daily as needed (for yeast prevention (skin graft) X 2 days).    Marland Kitchen FLUoxetine (PROZAC) 20 MG capsule Take 60 mg by mouth at bedtime.   3  . fluticasone (FLONASE) 50 MCG/ACT nasal spray Place 1 spray into both nostrils daily.    . furosemide (LASIX) 20 MG tablet Take 20-40 mg by mouth See admin instructions. Take 2 tablets (40 mg) in the morning & 1 tablet (20 mg) with lunch.    . gabapentin (NEURONTIN) 100 MG capsule Take 100 mg by mouth See admin instructions. Take up to 3 times daily as needed for neuropathy--takes scheduled in the morning & at night.    Marland Kitchen HYDROcodone-acetaminophen (NORCO/VICODIN) 5-325 MG tablet Take 1-2 tablets by mouth every 6 (six) hours as needed for pain.  0  . insulin lispro (HUMALOG) 100 UNIT/ML injection Inject 5-10 Units into the skin 3 (three) times daily before meals. Sliding Scale Insulin    . LANTUS 100 UNIT/ML injection Inject 2-15 Units into the skin 2 (two) times daily. Based on sliding scale  1  . levocetirizine (XYZAL) 5 MG tablet Take 5 mg by mouth daily.    Marland Kitchen MAGNESIUM PO Take 1 tablet by mouth daily with lunch.    . Melatonin 10 MG TABS Take 20 mg by mouth at bedtime as needed (for sleep.).    Marland Kitchen metoprolol (LOPRESSOR) 50 MG  tablet Take 50 mg by mouth 3 (three) times daily.   0  . NOVOLIN N 100 UNIT/ML injection Inject 0-7 Units into the skin  2 (two) times daily. Sliding scale insulin  1  . olopatadine (PATANOL) 0.1 % ophthalmic solution Place 2 drops into both eyes See admin instructions. Instill 2 drops into both eyes (scheduled) in the morning & as needed in the evening.  3  . omeprazole (PRILOSEC) 20 MG capsule Take 20 mg by mouth daily with supper.     . ondansetron (ZOFRAN) 4 MG tablet Take 4 mg by mouth 2 (two) times daily as needed. For nausea/vomiting prevention with pain medication.  2  . potassium chloride SA (K-DUR,KLOR-CON) 20 MEQ tablet Take 20 mEq by mouth daily with lunch.  0  . propylthiouracil (PTU) 50 MG tablet Take 100 mg by mouth 3 (three) times daily.  0  . rOPINIRole (REQUIP) 2 MG tablet Take 2 mg by mouth See admin instructions. Up to 4 times daily as needed for restless leg syndrome--take scheduled 3 times daily    . albuterol (PROVENTIL HFA;VENTOLIN HFA) 108 (90 BASE) MCG/ACT inhaler Inhale 2 puffs into the lungs every 6 (six) hours as needed for wheezing or shortness of breath. PROAIR    . albuterol (PROVENTIL) (2.5 MG/3ML) 0.083% nebulizer solution Take 2.5 mg by nebulization every 6 (six) hours as needed for wheezing or shortness of breath.    . dextromethorphan-guaiFENesin (MUCINEX DM) 30-600 MG per 12 hr tablet Take 1 tablet by mouth 2 (two) times daily as needed for cough (congestion).    . montelukast (SINGULAIR) 10 MG tablet Take 10 mg by mouth daily as needed (wheezing/chest tightening.).     Marland Kitchen Multiple Vitamins-Minerals (HAIR/SKIN/NAILS/BIOTIN) TABS Take 1 tablet by mouth daily with lunch.       Results for orders placed or performed during the hospital encounter of 07/21/17 (from the past 48 hour(s))  Glucose, capillary     Status: Abnormal   Collection Time: 07/21/17  5:47 AM  Result Value Ref Range   Glucose-Capillary 148 (H) 65 - 99 mg/dL   No results found.  Review of  Systems  All other systems reviewed and are negative.   Blood pressure (!) 120/35, pulse 74, temperature 98.4 F (36.9 C), temperature source Oral, resp. rate 18, height 5\' 1"  (1.549 m), weight 106.1 kg (234 lb), SpO2 95 %. Physical Exam  Constitutional: She is oriented to person, place, and time. She appears well-developed and well-nourished.  HENT:  Head: Atraumatic.  Eyes: EOM are normal.  Respiratory: Effort normal.  Musculoskeletal:  R shoulder pain with very limited movement.  Neurological: She is alert and oriented to person, place, and time.  Skin: Skin is warm and dry.  Psychiatric: She has a normal mood and affect.     Assessment/Plan  R shoulder pain and dysfunction HPI: Chronic R shoulder pain and dysfunction after chronic R shoulder fracture dislocation. Unable to perform daily activities and worsening pain. Patient is high risk for problems, but unable to live with shoulder in current condition. Plan R reverse TSA to try and stabilize shoulder, improve pain and function. Risks / benefits of surgery discussed Consent on chart  NPO for OR Preop antibiotics   Isabella Stalling, MD 07/21/2017, 7:21 AM

## 2017-07-21 NOTE — Evaluation (Signed)
Physical Therapy Evaluation Patient Details Name: [name] MRN: [id number] DOB: 1951-01-29 Today's Date: 07/21/2017   History of Present Illness  Pt is a 67 y/o female s/p R reverse TSA. PMH including but not limited to Grave's disease, HTN, DM and hx of C1 fx in 2014.    Clinical Impression  Pt presented supine in bed with HOB elevated, awake and willing to participate in therapy session. Prior to admission, pt reported that she was independent with all functional mobility and ADLs. Pt lives with her husband and son. She will have assistance intermittently upon d/c. Pt currently requires min A for bed mobility, min guard for transfers and min guard for short distance ambulation within her room. Pt able to minimally flex and extend all fingers on R hand, reports she is still very numb. Pt would continue to benefit from skilled physical therapy services at this time while admitted and after d/c to address the below listed limitations in order to improve overall safety and independence with functional mobility.     Follow Up Recommendations Home health PT;Supervision/Assistance - 24 hour    Equipment Recommendations  None recommended by PT    Recommendations for Other Services       Precautions / Restrictions Precautions Precautions: Shoulder Type of Shoulder Precautions: No passive or active ROM at shoulder; ROM at hand, wrist and elbow as tolerated  Required Braces or Orthoses: Sling Restrictions Weight Bearing Restrictions: Yes RUE Weight Bearing: Non weight bearing      Mobility  Bed Mobility Overal bed mobility: Needs Assistance Bed Mobility: Supine to Sit;Sit to Supine     Supine to sit: Min assist Sit to supine: Min guard   General bed mobility comments: increased time and effort, cueing for sequencing, use of bed rails with L UE, assist for trunk elevation  Transfers Overall transfer level: Needs assistance Equipment used: None Transfers: Sit to/from Stand Sit  to Stand: Min guard         General transfer comment: min guard for safety  Ambulation/Gait Ambulation/Gait assistance: Min guard Ambulation Distance (Feet): 25 Feet Assistive device: None Gait Pattern/deviations: Step-through pattern;Decreased step length - right;Decreased step length - left;Decreased stride length;Wide base of support;Drifts right/left Gait velocity: decreased Gait velocity interpretation: <1.8 ft/sec, indicate of risk for recurrent falls General Gait Details: pt with modest instability but no overt LOB or need for physical assistance, close min guard for safety; pt with very short stride length, cautious and guarded  Stairs            Wheelchair Mobility    Modified Rankin (Stroke Patients Only)       Balance Overall balance assessment: Needs assistance Sitting-balance support: Feet supported Sitting balance-Leahy Scale: Good     Standing balance support: During functional activity;No upper extremity supported Standing balance-Leahy Scale: Fair                               Pertinent Vitals/Pain Pain Assessment: No/denies pain    Home Living Family/patient expects to be discharged to:: Private residence Living Arrangements: Spouse/significant other;Children Available Help at Discharge: Family;Available PRN/intermittently Type of Home: House Home Access: Stairs to enter Entrance Stairs-Rails: Psychiatric nurse of Steps: 3 Home Layout: One level Home Equipment: Walker - 2 wheels;Cane - single point;Bedside commode;Shower seat;Hospital bed;Other (comment)(lift chair)      Prior Function Level of Independence: Independent  Hand Dominance   Dominant Hand: Right    Extremity/Trunk Assessment   Upper Extremity Assessment Upper Extremity Assessment: Defer to OT evaluation;RUE deficits/detail RUE Deficits / Details: no active or passive movement at shoulder per MD orders; pt with decreased  sensation to light touch; pt able to flex and extend all fingers actively through partial ROM RUE: Unable to fully assess due to immobilization;Unable to fully assess due to pain RUE Sensation: decreased light touch;decreased proprioception RUE Coordination: decreased gross motor;decreased fine motor    Lower Extremity Assessment Lower Extremity Assessment: Overall WFL for tasks assessed       Communication      Cognition Arousal/Alertness: Awake/alert Behavior During Therapy: WFL for tasks assessed/performed Overall Cognitive Status: Within Functional Limits for tasks assessed                                        General Comments      Exercises     Assessment/Plan    PT Assessment Patient needs continued PT services  PT Problem List Decreased strength;Decreased range of motion;Decreased activity tolerance;Decreased balance;Decreased mobility;Decreased coordination;Decreased safety awareness;Decreased knowledge of precautions       PT Treatment Interventions DME instruction;Gait training;Stair training;Functional mobility training;Therapeutic activities;Therapeutic exercise;Balance training;Neuromuscular re-education;Patient/family education    PT Goals (Current goals can be found in the Care Plan section)  Acute Rehab PT Goals Patient Stated Goal: return home PT Goal Formulation: With patient Time For Goal Achievement: 08/04/17 Potential to Achieve Goals: Good    Frequency Min 3X/week   Barriers to discharge        Co-evaluation               AM-PAC PT "6 Clicks" Daily Activity  Outcome Measure Difficulty turning over in bed (including adjusting bedclothes, sheets and blankets)?: Unable Difficulty moving from lying on back to sitting on the side of the bed? : Unable Difficulty sitting down on and standing up from a chair with arms (e.g., wheelchair, bedside commode, etc,.)?: A Little Help needed moving to and from a bed to chair  (including a wheelchair)?: A Little Help needed walking in hospital room?: A Little Help needed climbing 3-5 steps with a railing? : A Lot 6 Click Score: 13    End of Session Equipment Utilized During Treatment: Gait belt;Other (comment)(R UE sling) Activity Tolerance: Patient limited by fatigue Patient left: in bed;with call bell/phone within reach;with bed alarm set;with SCD's reapplied Nurse Communication: Mobility status PT Visit Diagnosis: Other abnormalities of gait and mobility (R26.89)    Time: 1511-1540 PT Time Calculation (min) (ACUTE ONLY): 29 min   Charges:   PT Evaluation $PT Eval Moderate Complexity: 1 Mod PT Treatments $Therapeutic Activity: 8-22 mins   PT G Codes:        [place], PT, DPT [place] 07/21/2017, 4:14 PM

## 2017-07-21 NOTE — Anesthesia Preprocedure Evaluation (Addendum)
Anesthesia Evaluation  Patient identified by MRN, date of birth, ID band Patient awake    Reviewed: Allergy & Precautions, NPO status , Patient's Chart, lab work & pertinent test results  History of Anesthesia Complications (+) PONV and history of anesthetic complications  Airway Mallampati: IV  TM Distance: >3 FB Neck ROM: Limited  Mouth opening: Limited Mouth Opening  Dental  (+) Teeth Intact, Dental Advisory Given   Pulmonary asthma , sleep apnea and Continuous Positive Airway Pressure Ventilation ,     + decreased breath sounds      Cardiovascular hypertension, Pt. on medications and Pt. on home beta blockers  Rhythm:Regular Rate:Normal     Neuro/Psych    GI/Hepatic   Endo/Other  diabetes, Type 2, Insulin Dependent, Oral Hypoglycemic Agents  Renal/GU Renal InsufficiencyRenal disease     Musculoskeletal   Abdominal (+) + obese,   Peds  Hematology   Anesthesia Other Findings   Reproductive/Obstetrics                           Anesthesia Physical Anesthesia Plan  ASA: III  Anesthesia Plan: General   Post-op Pain Management:    Induction: Intravenous  PONV Risk Score and Plan: Ondansetron and Metaclopromide  Airway Management Planned: Oral ETT  Additional Equipment:   Intra-op Plan:   Post-operative Plan: Extubation in OR  Informed Consent: I have reviewed the patients History and Physical, chart, labs and discussed the procedure including the risks, benefits and alternatives for the proposed anesthesia with the patient or authorized representative who has indicated his/her understanding and acceptance.     Plan Discussed with: CRNA and Anesthesiologist  Anesthesia Plan Comments:         Anesthesia Quick Evaluation

## 2017-07-22 ENCOUNTER — Encounter (HOSPITAL_COMMUNITY): Payer: Self-pay | Admitting: Orthopedic Surgery

## 2017-07-22 LAB — BASIC METABOLIC PANEL
Anion gap: 11 (ref 5–15)
BUN: 17 mg/dL (ref 6–20)
CO2: 31 mmol/L (ref 22–32)
Calcium: 8.7 mg/dL — ABNORMAL LOW (ref 8.9–10.3)
Chloride: 96 mmol/L — ABNORMAL LOW (ref 101–111)
Creatinine, Ser: 1.13 mg/dL — ABNORMAL HIGH (ref 0.44–1.00)
GFR calc Af Amer: 58 mL/min — ABNORMAL LOW (ref >60)
GFR calc non Af Amer: 50 mL/min — ABNORMAL LOW (ref >60)
Glucose, Bld: 178 mg/dL — ABNORMAL HIGH (ref 65–99)
Potassium: 3.9 mmol/L (ref 3.5–5.1)
Sodium: 138 mmol/L (ref 135–145)

## 2017-07-22 LAB — CBC
HCT: 28.3 % — ABNORMAL LOW (ref 36.0–46.0)
Hemoglobin: 9.1 g/dL — ABNORMAL LOW (ref 12.0–15.0)
MCH: 27.5 pg (ref 26.0–34.0)
MCHC: 32.2 g/dL (ref 30.0–36.0)
MCV: 85.5 fL (ref 78.0–100.0)
Platelets: 124 10*3/uL — ABNORMAL LOW (ref 150–400)
RBC: 3.31 MIL/uL — ABNORMAL LOW (ref 3.87–5.11)
RDW: 14.1 % (ref 11.5–15.5)
WBC: 3.5 10*3/uL — ABNORMAL LOW (ref 4.0–10.5)

## 2017-07-22 LAB — GLUCOSE, CAPILLARY
Glucose-Capillary: 159 mg/dL — ABNORMAL HIGH (ref 65–99)
Glucose-Capillary: 183 mg/dL — ABNORMAL HIGH (ref 65–99)
Glucose-Capillary: 228 mg/dL — ABNORMAL HIGH (ref 65–99)
Glucose-Capillary: 120 mg/dL — ABNORMAL HIGH (ref 65–99)

## 2017-07-22 MED ORDER — OXYCODONE HCL 5 MG PO TABS: 5.0000 mg | ORAL_TABLET | ORAL | 0 refills | Status: DC | PRN

## 2017-07-22 NOTE — Care Plan (Signed)
Met with patient and husband at bedside. She is doing well this am. No complaint of significant pain. Dressing is intact. PT eval is recommending HHPT for home. She has recently had [facility] and would like to have them again. I will get the order and make referral. Husband was at the bedside and agreeable. We discussed activity and safety concerns as well. Patient is a fall risk with her multiple comorbidities.   Please contact Ladell Heads, [place] with questions or if this plan needs to change   Thanks

## 2017-07-22 NOTE — Progress Notes (Signed)
   PATIENT ID: [name]   1 Day Post-Op Procedure(s) (LRB): RIGHT REVERSE SHOULDER ARTHROPLASTY (Right)  Subjective: Doing well. Minimal pain.   Objective:  Vitals:   07/21/17 2120 07/22/17 0505  BP: (!) 123/52 (!) 110/48  Pulse: 74 65  Resp: (!) 22   Temp: 98.3 F (36.8 C) 98.4 F (36.9 C)  SpO2: 93% 98%     R UE dressing c/d/i Wiggles fingers, distally NVI  Labs:  Recent Labs    07/22/17 0451  HGB 9.1*   Recent Labs    07/22/17 0451  WBC 3.5*  RBC 3.31*  HCT 28.3*  PLT 124*   Recent Labs    07/22/17 0451  NA 138  K 3.9  CL 96*  CO2 31  BUN 17  CREATININE 1.13*  GLUCOSE 178*  CALCIUM 8.7*    Assessment and Plan: 1 day s/p R reverse TSA with allograft for chronic dislocation  OT- hand wrist elbow ROM only PT ordered- she is a high fall risk, likely need SNF vs home health 24 hr dispo per PT ABLA- expected, asymptomatic, will continue to monitor  Follow up with Dr. Tamera Punt in 2 weeks   VTE proph: ASA, SCDs

## 2017-07-22 NOTE — Evaluation (Signed)
Occupational Therapy Evaluation Patient Details Name: [name] MRN: [id number] DOB: [birth year] Today's Date: 07/22/2017    History of Present Illness Pt is a 67 y/o female s/p R reverse TSA. PMH including but not limited to Grave's disease, HTN, DM and hx of C1 fx in 2014.   Clinical Impression   Patient is s/p R reverse TSA surgery resulting in functional limitations due to the deficits listed below (see OT problem list). PT requires extensive help with adls due to skin folds and body habitus. Pt could greatly benefit from an aide during the recovery processing until the R UE can actively (A). Pt educated on AE that can benefit. Pt very limited in movement with L UE due to body habitus.  Patient will benefit from skilled OT acutely to increase independence and safety with ADLS to allow discharge [place]. Pt could benefit from education on dietary modifications to help with overall weight loss to help with hygiene and adls.     Follow Up Recommendations  Home health OT;Other (comment)    Equipment Recommendations  None recommended by OT    Recommendations for Other Services       Precautions / Restrictions Precautions Precautions: Shoulder Type of Shoulder Precautions: NO PROM or AROM shoulder AROM hand wrist and elbow  Required Braces or Orthoses: Sling Restrictions Weight Bearing Restrictions: Yes RUE Weight Bearing: Non weight bearing      Mobility Bed Mobility Overal bed mobility: Needs Assistance Bed Mobility: Supine to Sit     Supine to sit: Min assist     General bed mobility comments: pt requries use of bed rail. pt does have elevating head rest at home   Transfers Overall transfer level: Needs assistance   Transfers: Sit to/from Stand Sit to Stand: Min assist         General transfer comment: pt requires use of L UE to pull up and has lift chair at home. pt encouraged to use the chair at a set height to avoid decr strengthening    Balance                                            ADL either performed or assessed with clinical judgement   ADL Overall ADL's : Needs assistance/impaired Eating/Feeding: Set up;Sitting   Grooming: Wash/dry hands;Wash/dry face;Oral care;Applying deodorant;Moderate assistance;Sitting Grooming Details (indicate cue type and reason): educated application and risk for keeping area dry for risk of infection. pt educated that spouse has to help and pt agreeable to "aide " if qualifies to avoid spouse help Upper Body Bathing: Moderate assistance Upper Body Bathing Details (indicate cue type and reason): pt requries (A) for skin folds Lower Body Bathing: Moderate assistance;Sit to/from stand   Upper Body Dressing : Moderate assistance Upper Body Dressing Details (indicate cue type and reason): educated on threading R UE first Lower Body Dressing: Moderate assistance Lower Body Dressing Details (indicate cue type and reason): requries the use of toilet tongs and wash cloth and educated on reach Toilet Transfer: Minimal assistance Toilet Transfer Details (indicate cue type and reason): cues for L hand use  Toileting- Clothing Manipulation and Hygiene: Moderate assistance Toileting - Clothing Manipulation Details (indicate cue type and reason): cues to avoid pressure on R UE     Functional mobility during ADLs: Minimal assistance General ADL Comments: pt educated in depth about skin care and use  of AE. purchased with [facility] in the room x2 toilet tongs.      Vision Baseline Vision/History: Wears glasses       Perception     Praxis      Pertinent Vitals/Pain Pain Assessment: Faces Faces Pain Scale: Hurts little more Pain Location: right Pain Descriptors / Indicators: Operative site guarding Pain Intervention(s): Premedicated before session;Monitored during session;Repositioned;RN gave pain meds during session     Hand Dominance Right   Extremity/Trunk Assessment Upper Extremity  Assessment Upper Extremity Assessment: RUE deficits/detail RUE Deficits / Details: no active or passive movement at shoulder per MD orders; pt with decreased sensation to light touch; pt able to flex and extend all fingers actively through partial ROM   Lower Extremity Assessment Lower Extremity Assessment: Defer to PT evaluation   Cervical / Trunk Assessment Cervical / Trunk Assessment: Kyphotic   Communication Communication Communication: No difficulties   Cognition Arousal/Alertness: Awake/alert Behavior During Therapy: WFL for tasks assessed/performed Overall Cognitive Status: Within Functional Limits for tasks assessed                                 General Comments: w   General Comments  dressing dry and intact    Exercises Exercises: Shoulder   Shoulder Instructions Shoulder Instructions Donning/doffing shirt without moving shoulder: Moderate assistance Method for sponge bathing under operated UE: Moderate assistance Donning/doffing sling/immobilizer: Minimal assistance Correct positioning of sling/immobilizer: Minimal assistance ROM for elbow, wrist and digits of operated UE: Supervision/safety Sling wearing schedule (on at all times/off for ADL's): Modified independent Proper positioning of operated UE when showering: Supervision/safety Positioning of UE while sleeping: Moderate assistance    Home Living Family/patient expects to be discharged to:: Private residence Living Arrangements: Spouse/significant other Available Help at Discharge: Family;Available PRN/intermittently Type of Home: House Home Access: Stairs to enter [employer] of Steps: 3 Entrance Stairs-Rails: Right;Left Home Layout: One level     Bathroom Shower/Tub: Occupational psychologist: Standard     Home Equipment: Environmental consultant - 2 wheels;Cane - single point;Bedside commode;Shower seat;Hospital bed;Other (comment)   Additional Comments: pt has a dog 44 yo "Bella"  - pt self reports that "dog has been trouble for my recover she chewed on wound vac and my oxygen tubes"      Prior Functioning/Environment Level of Independence: Independent        Comments: husband works and can (A) PRN        OT Problem List: Decreased knowledge of precautions;Decreased activity tolerance;Decreased range of motion;Decreased strength;Impaired balance (sitting and/or standing);Decreased safety awareness;Decreased knowledge of use of DME or AE;Obesity;Pain;Impaired UE functional use      OT Treatment/Interventions: Self-care/ADL training;Therapeutic exercise;DME and/or AE instruction;Therapeutic activities;Patient/family education;Balance training    OT Goals(Current goals can be found in the care plan section) Acute Rehab OT Goals Patient Stated Goal: return home OT Goal Formulation: With patient Time For Goal Achievement: 08/05/17 Potential to Achieve Goals: Good  OT Frequency: Min 2X/week   Barriers to D/C:            Co-evaluation              AM-PAC PT "6 Clicks" Daily Activity     Outcome Measure Help from another person eating meals?: None Help from another person taking care of personal grooming?: A Little Help from another person toileting, which includes using toliet, bedpan, or urinal?: A Lot Help from another person bathing (including  washing, rinsing, drying)?: A Lot Help from another person to put on and taking off regular upper body clothing?: A Lot Help from another person to put on and taking off regular lower body clothing?: A Lot 6 Click Score: 15   End of Session Nurse Communication: Mobility status;Weight bearing status  Activity Tolerance: Patient tolerated treatment well Patient left: in chair;with call bell/phone within reach  OT Visit Diagnosis: Unsteadiness on feet (R26.81);Repeated falls (R29.6)                Time: 1017-1100 OT Time Calculation (min): 43 min Charges:  OT General Charges $OT Visit: 1 Visit OT  Evaluation $OT Eval Moderate Complexity: 1 Mod OT Treatments $Self Care/Home Management : 23-37 mins G-Codes:      Jeri Modena   OTR/L Pager: [phone] Office: [phone] .   Parke Poisson B 07/22/2017, 12:18 PM

## 2017-07-22 NOTE — Discharge Instructions (Signed)
Discharge Instructions after Reverse Total Shoulder Arthroplasty ° ° °A sling has been provided for you. You are to wear this at all times, even while sleeping, until your first post operative visit with Dr. Chandler. °Use ice on the shoulder intermittently over the first 48 hours after surgery.  °Pain medicine has been prescribed for you.  °Use your medicine liberally over the first 48 hours, and then you can begin to taper your use. You may take Extra Strength Tylenol or Tylenol only in place of the pain pills. DO NOT take ANY nonsteroidal anti-inflammatory pain medications: Advil, Motrin, Ibuprofen, Aleve, Naproxen or Naprosyn.  °Take one aspirin a day for 2 weeks after surgery, unless you have an aspirin sensitivity/allergy or asthma.  °Leave your dressing on until your first follow up visit.  You may shower with the dressing.  Hold your arm as if you still have your sling on while you shower. °Simply allow the water to wash over the site and then pat dry. Make sure your axilla (armpit) is completely dry after showering. ° ° ° °Please call [phone] during normal business hours or [phone] after hours for any problems. Including the following: ° °- excessive redness of the incisions °- drainage for more than 4 days °- fever of more than 101.5 F ° °*Please note that pain medications will not be refilled after hours or on weekends. ° ° ° ° °

## 2017-07-22 NOTE — Progress Notes (Signed)
Physical Therapy Treatment Patient Details Name: [name] MRN: [id number] DOB: Jun 03, 1950 Today's Date: 07/22/2017    History of Present Illness Pt is a 67 y/o female s/p R reverse TSA. PMH including but not limited to Grave's disease, HTN, DM and hx of C1 fx in 2014.    PT Comments    Pt making fair progress with functional mobility. She remains very unsteady with ambulation especially in hallway (versus in room). Pt required constant min guard to min A for distance ambulation this session (~100' x2 with extended sitting rest break secondary to fatigue). Pt would continue to benefit from skilled physical therapy services at this time while admitted and after d/c to address the below listed limitations in order to improve overall safety and independence with functional mobility.  Of note, pt reported that she will have assistance at home only intermittently from husband and son. Pt would greatly benefit from 24/7 supervision/assistance as she is at an increased risk for falls.    Follow Up Recommendations  Home health PT;Supervision/Assistance - 24 hour ([place] aide if qualifies)     Equipment Recommendations  None recommended by PT    Recommendations for Other Services       Precautions / Restrictions Precautions Precautions: Shoulder Type of Shoulder Precautions: NO PROM or AROM shoulder AROM hand wrist and elbow  Required Braces or Orthoses: Sling Restrictions Weight Bearing Restrictions: Yes RUE Weight Bearing: Non weight bearing    Mobility  Bed Mobility Overal bed mobility: Needs Assistance Bed Mobility: Supine to Sit     Supine to sit: Min assist     General bed mobility comments: pt sitting OOB in recliner chair upon arrival  Transfers Overall transfer level: Needs assistance Equipment used: None Transfers: Sit to/from Stand Sit to Stand: Min assist         General transfer comment: increased time, min A for stability with transitional movement; x1 from  recliner chair, x1 from chair in 5N lobby area  Ambulation/Gait Ambulation/Gait assistance: Min assist;Min guard Ambulation Distance (Feet): 100 Feet(100' x2 with extended sitting rest break) Assistive device: 1 person hand held assist Gait Pattern/deviations: Step-through pattern;Decreased step length - right;Decreased step length - left;Decreased stride length;Wide base of support;Drifts right/left Gait velocity: decreased Gait velocity interpretation: <1.31 ft/sec, indicative of household ambulator General Gait Details: pt with modest instability requiring close min guard to min A with 1HHA   Stairs             Wheelchair Mobility    Modified Rankin (Stroke Patients Only)       Balance Overall balance assessment: Needs assistance Sitting-balance support: Feet supported Sitting balance-Leahy Scale: Good     Standing balance support: During functional activity;No upper extremity supported Standing balance-Leahy Scale: Fair Standing balance comment: static is fair, dyanmic is poor                            Cognition Arousal/Alertness: Awake/alert Behavior During Therapy: WFL for tasks assessed/performed Overall Cognitive Status: Within Functional Limits for tasks assessed                                 General Comments: w      Exercises Donning/doffing shirt without moving shoulder: Moderate assistance Method for sponge bathing under operated UE: Moderate assistance Donning/doffing sling/immobilizer: Minimal assistance Correct positioning of sling/immobilizer: Minimal assistance ROM for elbow, wrist and  digits of operated UE: Supervision/safety Sling wearing schedule (on at all times/off for ADL's): Modified independent Proper positioning of operated UE when showering: Supervision/safety Positioning of UE while sleeping: Moderate assistance    General Comments General comments (skin integrity, edema, etc.): dressing dry and intact       Pertinent Vitals/Pain Pain Assessment: Faces Faces Pain Scale: Hurts little more Pain Location: R shoulder Pain Descriptors / Indicators: Operative site guarding Pain Intervention(s): Monitored during session;Repositioned    Home Living Family/patient expects to be discharged to:: Private residence Living Arrangements: Spouse/significant other Available Help at Discharge: Family;Available PRN/intermittently Type of Home: House Home Access: Stairs to enter Entrance Stairs-Rails: Right;Left Home Layout: One level Home Equipment: Environmental consultant - 2 wheels;Cane - single point;Bedside commode;Shower seat;Hospital bed;Other (comment) Additional Comments: pt has a dog 44 yo "Bella" - pt self reports that "dog has been trouble for my recover she chewed on wound vac and my oxygen tubes"    Prior Function Level of Independence: Independent      Comments: husband works and can (A) PRN   PT Goals (current goals can now be found in the care plan section) Acute Rehab PT Goals Patient Stated Goal: return home PT Goal Formulation: With patient Time For Goal Achievement: 08/04/17 Potential to Achieve Goals: Good Progress towards PT goals: Progressing toward goals    Frequency    Min 3X/week      PT Plan Current plan remains appropriate    Co-evaluation              AM-PAC PT "6 Clicks" Daily Activity  Outcome Measure  Difficulty turning over in bed (including adjusting bedclothes, sheets and blankets)?: Unable Difficulty moving from lying on back to sitting on the side of the bed? : Unable Difficulty sitting down on and standing up from a chair with arms (e.g., wheelchair, bedside commode, etc,.)?: A Little Help needed moving to and from a bed to chair (including a wheelchair)?: A Little Help needed walking in hospital room?: A Little Help needed climbing 3-5 steps with a railing? : A Lot 6 Click Score: 13    End of Session Equipment Utilized During Treatment: Gait belt;Other  (comment)(R UE sling) Activity Tolerance: Patient limited by fatigue;Patient limited by pain Patient left: in chair;with call bell/phone within reach Nurse Communication: Mobility status PT Visit Diagnosis: Other abnormalities of gait and mobility (R26.89)     Time: 8889-1694 PT Time Calculation (min) (ACUTE ONLY): 26 min  Charges:  $Gait Training: 8-22 mins $Therapeutic Activity: 8-22 mins                    G Codes:       [place], [place], [place] 07/22/2017, 2:27 PM

## 2017-07-22 NOTE — Care Management Note (Signed)
Case Management Note  Patient Details  Name: [name] MRN: [id number] Date of Birth: 1950/06/21  Subjective/Objective:    67 yr old female s/p right reverse total shoulder arthroplasty.                Action/Plan:  Case manager was informed by Ladell Heads, RN Case Manager for [facility], that she would arrange for HHPT/OT with [facility]. CM provided her with Neoma Laming RN, [facility] Liaison's phone number.      Expected Discharge Date:    pending              Expected Discharge Plan:  [place]  In-House Referral:  NA  Discharge planning Services  CM Consult  Post Acute Care Choice:  Home Health Choice offered to:  NA(arranged by office CM)  DME Arranged:  N/A DME Agency:  NA  HH Arranged:  PT, OT [place] Agency:  [place]  Status of Service:  In process, will continue to follow  If discussed at Long Length of Stay Meetings, dates discussed:    Additional Comments:  Ninfa Meeker, RN 07/22/2017, 1:25 PM

## 2017-07-23 LAB — GLUCOSE, CAPILLARY
Glucose-Capillary: 162 mg/dL — ABNORMAL HIGH (ref 65–99)
Glucose-Capillary: 155 mg/dL — ABNORMAL HIGH (ref 65–99)

## 2017-07-23 LAB — BASIC METABOLIC PANEL
Anion gap: 10 (ref 5–15)
BUN: 15 mg/dL (ref 6–20)
CO2: 31 mmol/L (ref 22–32)
Calcium: 8.5 mg/dL — ABNORMAL LOW (ref 8.9–10.3)
Chloride: 95 mmol/L — ABNORMAL LOW (ref 101–111)
Creatinine, Ser: 1.09 mg/dL — ABNORMAL HIGH (ref 0.44–1.00)
GFR calc Af Amer: >60 mL/min (ref >60)
GFR calc non Af Amer: 52 mL/min — ABNORMAL LOW (ref >60)
Glucose, Bld: 180 mg/dL — ABNORMAL HIGH (ref 65–99)
Potassium: 4 mmol/L (ref 3.5–5.1)
Sodium: 136 mmol/L (ref 135–145)

## 2017-07-23 LAB — CBC
HCT: 27 % — ABNORMAL LOW (ref 36.0–46.0)
Hemoglobin: 8.7 g/dL — ABNORMAL LOW (ref 12.0–15.0)
MCH: 28 pg (ref 26.0–34.0)
MCHC: 32.2 g/dL (ref 30.0–36.0)
MCV: 86.8 fL (ref 78.0–100.0)
Platelets: 126 10*3/uL — ABNORMAL LOW (ref 150–400)
RBC: 3.11 MIL/uL — ABNORMAL LOW (ref 3.87–5.11)
RDW: 14.7 % (ref 11.5–15.5)
WBC: 3.2 10*3/uL — ABNORMAL LOW (ref 4.0–10.5)

## 2017-07-23 NOTE — Progress Notes (Signed)
Occupational Therapy Treatment Patient Details Name: [name] MRN: [id number] DOB: September 01, 1950 Today's Date: 07/23/2017    History of present illness Pt is a 67 y/o female s/p R reverse TSA. PMH including but not limited to Grave's disease, HTN, DM and hx of C1 fx in 2014.   OT comments  Son and husband available for session. Educated in positioning R UE in bed and chair, sling use, compensatory strategies for ADL and fall prevention. Pt unsteady, has a cane at home per husband. Pt has also ordered toilet tongs. Performed AROM R elbow to hand and instructed to continue x 3 per day. Pt and husband verbalizing understanding of all information.   Follow Up Recommendations  Home health OT;Other (comment)(Home health aide)    Equipment Recommendations  None recommended by OT    Recommendations for Other Services      Precautions / Restrictions Precautions Precautions: Shoulder Type of Shoulder Precautions: NO PROM or AROM shoulder AROM hand wrist and elbow  Shoulder Interventions: Shoulder sling/immobilizer;Off for dressing/bathing/exercises Precaution Booklet Issued: Yes (comment) Required Braces or Orthoses: Sling Restrictions Weight Bearing Restrictions: Yes RUE Weight Bearing: Non weight bearing       Mobility Bed Mobility               General bed mobility comments: pt in chair, likely will sleep in her lift chair  Transfers Overall transfer level: Needs assistance Equipment used: 1 person hand held assist Transfers: Sit to/from Stand Sit to Stand: Min guard              Balance Overall balance assessment: Needs assistance Sitting-balance support: Feet supported Sitting balance-Leahy Scale: Good       Standing balance-Leahy Scale: Fair Standing balance comment: static is fair, dyanmic is poor                           ADL either performed or assessed with clinical judgement   ADL                                         General ADL Comments: Pt reporting she has ordered toilet tongs. Reviewed compensatory strategies for ADL. Pt plans to wear dresses and slip on shoes. Educated in safe footwear. Instructed husband in donning and doffing sling, sling wearing schedule.      Vision       Perception     Praxis      Cognition Arousal/Alertness: Awake/alert Behavior During Therapy: WFL for tasks assessed/performed Overall Cognitive Status: Within Functional Limits for tasks assessed                                          Exercises     Shoulder Instructions Shoulder Instructions ROM for elbow, wrist and digits of operated UE: Supervision/safety     General Comments      Pertinent Vitals/ Pain       Pain Assessment: Faces Faces Pain Scale: Hurts little more Pain Location: R shoulder Pain Descriptors / Indicators: Sore Pain Intervention(s): Monitored during session;Repositioned;Premedicated before session  Home Living  Prior Functioning/Environment              Frequency  Min 2X/week        Progress Toward Goals  OT Goals(current goals can now be found in the care plan section)  Progress towards OT goals: Progressing toward goals  Acute Rehab OT Goals Patient Stated Goal: return home Time For Goal Achievement: 08/05/17 Potential to Achieve Goals: Good  Plan Discharge plan remains appropriate    Co-evaluation                 AM-PAC PT "6 Clicks" Daily Activity     Outcome Measure   Help from another person eating meals?: None Help from another person taking care of personal grooming?: A Little Help from another person toileting, which includes using toliet, bedpan, or urinal?: A Little Help from another person bathing (including washing, rinsing, drying)?: A Little Help from another person to put on and taking off regular upper body clothing?: A Little Help from another person to put on  and taking off regular lower body clothing?: A Little 6 Click Score: 19    End of Session    OT Visit Diagnosis: Unsteadiness on feet (R26.81);Repeated falls (R29.6)   Activity Tolerance Patient tolerated treatment well   Patient Left in chair;with call bell/phone within reach   Nurse Communication          Time: 4166-0630 OT Time Calculation (min): 26 min  Charges: OT General Charges $OT Visit: 1 Visit OT Treatments $Self Care/Home Management : 23-37 mins  07/23/2017 Nestor Lewandowsky, OTR/L Pager: [phone]  Werner Lean, [place] 07/23/2017, 8:55 AM

## 2017-07-23 NOTE — Progress Notes (Signed)
Discharge home. Home discvharge instruction given

## 2017-07-23 NOTE — Plan of Care (Signed)
  Problem: Activity: Goal: Ability to tolerate increased activity will improve Outcome: Progressing   Problem: Pain Management: Goal: Pain level will decrease with appropriate interventions Outcome: Progressing   Problem: Health Behavior/Discharge Planning: Goal: Ability to manage health-related needs will improve Outcome: Progressing   Problem: Clinical Measurements: Goal: Ability to maintain clinical measurements within normal limits will improve Outcome: Progressing Goal: Will remain free from infection Outcome: Progressing Goal: Diagnostic test results will improve Outcome: Progressing Goal: Respiratory complications will improve Outcome: Progressing Goal: Cardiovascular complication will be avoided Outcome: Progressing   Problem: Activity: Goal: Risk for activity intolerance will decrease Outcome: Progressing   Problem: Nutrition: Goal: Adequate nutrition will be maintained Outcome: Progressing   Problem: Coping: Goal: Level of anxiety will decrease Outcome: Progressing   Problem: Elimination: Goal: Will not experience complications related to bowel motility Outcome: Progressing Goal: Will not experience complications related to urinary retention Outcome: Progressing   Problem: Pain Managment: Goal: General experience of comfort will improve Outcome: Progressing   Problem: Safety: Goal: Ability to remain free from injury will improve Outcome: Progressing   Problem: Skin Integrity: Goal: Risk for impaired skin integrity will decrease Outcome: Progressing

## 2017-07-23 NOTE — Progress Notes (Signed)
Verified referral made to [facility].

## 2017-07-23 NOTE — Progress Notes (Signed)
Physical Therapy Treatment Patient Details Name: [name] MRN: [id number] DOB: Jan 29, 1951 Today's Date: 07/23/2017    History of Present Illness Pt is a 67 y/o female s/p R reverse TSA. PMH including but not limited to Grave's disease, HTN, DM and hx of C1 fx in 2014.    PT Comments    Pt making steady progress with functional mobility, requiring less assistance this session with transfers and ambulation. Pt's spouse present throughout session as well. Pt would continue to benefit from skilled physical therapy services at this time while admitted and after d/c to address the below listed limitations in order to improve overall safety and independence with functional mobility.    Follow Up Recommendations  Home health PT;Supervision/Assistance - 24 hour     Equipment Recommendations  None recommended by PT    Recommendations for Other Services       Precautions / Restrictions Precautions Precautions: Shoulder Type of Shoulder Precautions: NO PROM or AROM shoulder AROM hand wrist and elbow  Shoulder Interventions: Shoulder sling/immobilizer;Off for dressing/bathing/exercises Precaution Booklet Issued: Yes (comment) Required Braces or Orthoses: Sling Restrictions Weight Bearing Restrictions: Yes RUE Weight Bearing: Non weight bearing    Mobility  Bed Mobility               General bed mobility comments: pt OOB in recliner chair upon arrival  Transfers Overall transfer level: Needs assistance Equipment used: None Transfers: Sit to/from Stand Sit to Stand: Min guard         General transfer comment: min guard for safety, pt with improved stability with transition  Ambulation/Gait Ambulation/Gait assistance: Min guard Ambulation Distance (Feet): 50 Feet Assistive device: 1 person hand held assist;None Gait Pattern/deviations: Step-through pattern;Decreased step length - right;Decreased step length - left;Decreased stride length;Wide base of support Gait  velocity: decreased Gait velocity interpretation: <1.31 ft/sec, indicative of household ambulator General Gait Details: pt with mild instability ambulating within room with and without [place]; pt's spouse present as well   Stairs             Wheelchair Mobility    Modified Rankin (Stroke Patients Only)       Balance Overall balance assessment: Needs assistance Sitting-balance support: Feet supported Sitting balance-Leahy Scale: Good     Standing balance support: During functional activity;No upper extremity supported Standing balance-Leahy Scale: Fair Standing balance comment: static is fair, dyanmic is poor                            Cognition Arousal/Alertness: Awake/alert Behavior During Therapy: WFL for tasks assessed/performed Overall Cognitive Status: Within Functional Limits for tasks assessed                                        Exercises ROM for elbow, wrist and digits of operated UE: Supervision/safety    General Comments        Pertinent Vitals/Pain Pain Assessment: Faces Faces Pain Scale: Hurts a little bit Pain Location: R shoulder Pain Descriptors / Indicators: Sore;Aching Pain Intervention(s): Monitored during session;Repositioned    Home Living                      Prior Function            PT Goals (current goals can now be found in the care plan section) Acute Rehab PT  Goals Patient Stated Goal: return home PT Goal Formulation: With patient Time For Goal Achievement: 08/04/17 Potential to Achieve Goals: Good Progress towards PT goals: Progressing toward goals    Frequency    Min 3X/week      PT Plan Current plan remains appropriate    Co-evaluation              AM-PAC PT "6 Clicks" Daily Activity  Outcome Measure  Difficulty turning over in bed (including adjusting bedclothes, sheets and blankets)?: Unable Difficulty moving from lying on back to sitting on the side of the bed?  : Unable Difficulty sitting down on and standing up from a chair with arms (e.g., wheelchair, bedside commode, etc,.)?: A Little Help needed moving to and from a bed to chair (including a wheelchair)?: A Little Help needed walking in hospital room?: A Little Help needed climbing 3-5 steps with a railing? : A Lot 6 Click Score: 13    End of Session Equipment Utilized During Treatment: Gait belt;Other (comment)(R UE sling) Activity Tolerance: Patient tolerated treatment well Patient left: in chair;with call bell/phone within reach;with family/visitor present Nurse Communication: Mobility status PT Visit Diagnosis: Other abnormalities of gait and mobility (R26.89)     Time: 2585-2778 PT Time Calculation (min) (ACUTE ONLY): 14 min  Charges:  $Therapeutic Activity: 8-22 mins                    G Codes:       [place], PT, DPT [place] 07/23/2017, 9:45 AM

## 2017-07-23 NOTE — Progress Notes (Signed)
    Patient doing well PO day 2 status post right reverse shoulder arthroplasty by Dr. Berenice Primas.  She reports her pain as well controlled.  She continues to make steady progress with physical therapy and occupational therapy.  Home health 24 hour care has been established as well as physical therapy.  She has been eating and drinking normally.  She has normal bowel and bladder function. She denies fatigue or shortness of breath or weakness.   Physical Exam: Vitals:   07/23/17 0454 07/23/17 0508  BP: (!) 119/46   Pulse: 79   Resp:    Temp: 98 F (36.7 C)   SpO2: (!) 78% 91%   CBC Latest Ref Rng & Units 07/23/2017 07/22/2017 07/13/2017  WBC 4.0 - 10.5 K/uL 3.2(L) 3.5(L) 2.7(L)  Hemoglobin 12.0 - 15.0 g/dL 8.7(L) 9.1(L) 11.7(L)  Hematocrit 36.0 - 46.0 % 27.0(L) 28.3(L) 36.3  Platelets 150 - 400 K/uL 126(L) 124(L) 158   Hg down to 8.7, pt continues to be asymptomatic   BMP Latest Ref Rng & Units 07/23/2017 07/22/2017 07/13/2017  Glucose 65 - 99 mg/dL 180(H) 178(H) 184(H)  BUN 6 - 20 mg/dL 15 17 22(H)  Creatinine 0.44 - 1.00 mg/dL 1.09(H) 1.13(H) 1.12(H)  Sodium 135 - 145 mmol/L 136 138 136  Potassium 3.5 - 5.1 mmol/L 4.0 3.9 3.9  Chloride 101 - 111 mmol/L 95(L) 96(L) 97(L)  CO2 22 - 32 mmol/L 31 31 29   Calcium 8.9 - 10.3 mg/dL 8.5(L) 8.7(L) 9.6   Dressing in place, CDI, mild swelling, ecchymosis present into upper arm, distal compartments soft, 2+DRP symmetric  NVI  POD #2 s/p right reverse shoulder arthroplasty by Dr. Tamera Punt  With good progress in physical therapy and occupational therapy.  - Discharge home with 24-hour home health and physical therapy, already set up - up with PT/OT, encourage ambulation - Continue current px regimen - d/c home today with 24hr HH, f/u in 2 weeks

## 2017-07-26 DIAGNOSIS — Z794 Long term (current) use of insulin: Secondary | ICD-10-CM | POA: Diagnosis not present

## 2017-07-26 DIAGNOSIS — M797 Fibromyalgia: Secondary | ICD-10-CM | POA: Diagnosis not present

## 2017-07-26 DIAGNOSIS — D649 Anemia, unspecified: Secondary | ICD-10-CM | POA: Diagnosis not present

## 2017-07-26 DIAGNOSIS — Z96611 Presence of right artificial shoulder joint: Secondary | ICD-10-CM | POA: Diagnosis not present

## 2017-07-26 DIAGNOSIS — G4733 Obstructive sleep apnea (adult) (pediatric): Secondary | ICD-10-CM | POA: Diagnosis not present

## 2017-07-26 DIAGNOSIS — F329 Major depressive disorder, single episode, unspecified: Secondary | ICD-10-CM | POA: Diagnosis not present

## 2017-07-26 DIAGNOSIS — E039 Hypothyroidism, unspecified: Secondary | ICD-10-CM | POA: Diagnosis not present

## 2017-07-26 DIAGNOSIS — E1140 Type 2 diabetes mellitus with diabetic neuropathy, unspecified: Secondary | ICD-10-CM | POA: Diagnosis not present

## 2017-07-26 DIAGNOSIS — Z7982 Long term (current) use of aspirin: Secondary | ICD-10-CM | POA: Diagnosis not present

## 2017-07-26 DIAGNOSIS — E785 Hyperlipidemia, unspecified: Secondary | ICD-10-CM | POA: Diagnosis not present

## 2017-07-26 DIAGNOSIS — M1990 Unspecified osteoarthritis, unspecified site: Secondary | ICD-10-CM | POA: Diagnosis not present

## 2017-07-26 DIAGNOSIS — Z471 Aftercare following joint replacement surgery: Secondary | ICD-10-CM | POA: Diagnosis not present

## 2017-07-26 DIAGNOSIS — I10 Essential (primary) hypertension: Secondary | ICD-10-CM | POA: Diagnosis not present

## 2017-07-27 NOTE — Discharge Summary (Signed)
Patient ID: [name] MRN: [id number] DOB/AGE: May 26, 1950 67 y.o.  Admit date: 07/21/2017 Discharge date: 07/23/17  Admission Diagnoses:  Active Problems:   S/P reverse total shoulder arthroplasty, right   Discharge Diagnoses:  Same  Past Medical History:  Diagnosis Date  . Anemia   . Arthritis   . Asthma    OCCAS  . Bell's palsy   . Broken neck ([place])    C-1  . Carbuncle and furuncle of trunk   . Chronic dislocation of right shoulder   . Depression   . Disorder of fascia    HX OF NECROTIC FASCITIS AFTER ABDOMINAL SURGERY FOR HERNIA- REQUIRED 19 SURGERIES AND 2.5 MONTH HOSPITALIZATION AT [facility]  . DM (diabetes mellitus) ([place])   . Dysrhythmia    HX OF TACHYCARDIA AND BRADYCARDIA - ON GOING FOR YEARS - DOES NOT HAVE TO SEE CARDIOLOGIST  . Elevated cholesterol   . Fibromyalgia   . Fracture MAY 2014   HX OF FRACTURED NECK C1- CAUSES SEVERE HEADACHES--LIMITED ROM NECK  . Frequent infections    ESPECIALLY PRONE TO INFECTIONS AFTER SURGERIES  . Graves disease   . Heart murmur    DOES NOT CAUSE ANY PROBLEMS  . History of kidney stones   . Hyperlipidemia   . Hypertension   . Hypothyroidism    GRAVES DISEASE  . Migraine   . 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 Neuropathy   . Obesity   . OSA (obstructive sleep apnea)    USES CPAP - DOES NOT KNOW SETTING  . Pain    LEFT SHOULDER  PAIN -HARD TO LIE ON LEFT SIDE FOR LONG PERIOD;  PAIN IN LOWER BADK - 3 HERNIATED DISCS AND STENOISIS -  . PONV (postoperative nausea and vomiting)    THE GAS MAKES ME NAUSEATED  . Restless leg syndrome   . Sciatica   . Tachycardia   . Urinary frequency   . Urticaria   . UTI (urinary tract infection)     Surgeries: Procedure(s): RIGHT REVERSE SHOULDER ARTHROPLASTY on 07/21/2017   Consultants: None  Discharged Condition: Improved  Hospital Course: [name] is an 67 y.o. female who was admitted 07/21/2017 for operative treatment of complete rotator  cuff tear and chronic dislocation. Patient has severe unremitting pain that affects sleep, daily activities, and work/hobbies. After pre-op clearance the patient was taken to the operating room on 07/21/2017 and underwent  Procedure(s): RIGHT REVERSE SHOULDER ARTHROPLASTY.    Patient was given perioperative antibiotics:  Anti-infectives (From admission, onward)   Start     Dose/Rate Route Frequency Ordered Stop   07/21/17 1200  ceFAZolin (ANCEF) IVPB 2g/100 mL premix     2 g 200 mL/hr over 30 Minutes Intravenous Every 6 hours 07/21/17 1116 07/22/17 0031   07/21/17 1116  fluconazole (DIFLUCAN) tablet 200 mg  Status:  Discontinued     200 mg Oral Daily PRN 07/21/17 1116 07/23/17 1807   07/21/17 0557  ceFAZolin (ANCEF) 2-4 GM/100ML-% IVPB    Note to Pharmacy:  Bobbie Stack   : cabinet override      07/21/17 0557 07/21/17 0744   07/21/17 0550  ceFAZolin (ANCEF) IVPB 2g/100 mL premix     2 g 200 mL/hr over 30 Minutes Intravenous On call to O.R. 07/21/17 0550 07/21/17 0759       Patient was given sequential compression devices, early ambulation to prevent DVT.  Patient benefited maximally from hospital stay  and there were no complications.    Recent vital signs: BP (!) 123/47 (BP Location: Left Arm)   Pulse 73   Temp 99 F (37.2 C) (Oral)   Resp 18   Ht 5\' 1"  (1.549 m)   Wt 106.1 kg (234 lb)   SpO2 100%   BMI 44.21 kg/m   Discharge Medications:   Allergies as of 07/23/2017      Reactions   Imitrex [sumatriptan] Other (See Comments)   Vascular spasms   Morphine Other (See Comments)   Severe headaches   Nsaids Other (See Comments)   PT UNABLE TO TOLERATE NSAID'S DRUGS DUE TO HX OF GASTRIC SLEEVE SURGERY   Codeine Nausea Only   Promethazine Hcl Other (See Comments)   Restless leg feeling all over body   Rosuvastatin Calcium Other (See Comments)   Leg/muscle pain   Statins Other (See Comments)   Leg/muscle pain   Sulfonamide Derivatives Hives   Childhood allergy   Vicodin  [hydrocodone-acetaminophen] Nausea And Vomiting      Medication List    STOP taking these medications   HYDROcodone-acetaminophen 5-325 MG tablet Commonly known as:  NORCO/VICODIN     TAKE these medications   albuterol 108 (90 Base) MCG/ACT inhaler Commonly known as:  PROVENTIL HFA;VENTOLIN HFA Inhale 2 puffs into the lungs every 6 (six) hours as needed for wheezing or shortness of breath. PROAIR   albuterol (2.5 MG/3ML) 0.083% nebulizer solution Commonly known as:  PROVENTIL Take 2.5 mg by nebulization every 6 (six) hours as needed for wheezing or shortness of breath.   amLODipine 5 MG tablet Commonly known as:  NORVASC Take 5 mg by mouth at bedtime.   clonazePAM 0.5 MG tablet Commonly known as:  KLONOPIN Take 0.5 mg by mouth at bedtime as needed for anxiety. Up to 3 times daily as needed for anxiety, take 1 tablet (0.5 mg) scheduled each night at bedtime.   cyclobenzaprine 10 MG tablet Commonly known as:  FLEXERIL Take 10 mg by mouth See admin instructions. Take 1 tablet (10 mg) every night at bedtime, may take an additional tablet during the day as needed for muscle spasms   dextromethorphan-guaiFENesin 30-600 MG 12hr tablet Commonly known as:  MUCINEX DM Take 1 tablet by mouth 2 (two) times daily as needed for cough (congestion).   doxazosin 2 MG tablet Commonly known as:  CARDURA Take 2 mg by mouth at bedtime.   fenofibrate 54 MG tablet Take 54 mg by mouth daily with supper.   fluconazole 200 MG tablet Commonly known as:  DIFLUCAN Take 200 mg by mouth daily as needed (for yeast prevention (skin graft) X 2 days).   FLUoxetine 20 MG capsule Commonly known as:  PROZAC Take 60 mg by mouth at bedtime.   fluticasone 50 MCG/ACT nasal spray Commonly known as:  FLONASE Place 1 spray into both nostrils daily.   furosemide 20 MG tablet Commonly known as:  LASIX Take 20-40 mg by mouth See admin instructions. Take 2 tablets (40 mg) in the morning & 1 tablet (20 mg)  with lunch.   gabapentin 100 MG capsule Commonly known as:  NEURONTIN Take 100 mg by mouth See admin instructions. Take up to 3 times daily as needed for neuropathy--takes scheduled in the morning & at night.   HAIR/SKIN/NAILS/BIOTIN Tabs Take 1 tablet by mouth daily with lunch.   insulin lispro 100 UNIT/ML injection Commonly known as:  HUMALOG Inject 5-10 Units into the skin 3 (three) times daily before meals. Sliding  Scale Insulin   LANTUS 100 UNIT/ML injection Generic drug:  insulin glargine Inject 2-15 Units into the skin 2 (two) times daily. Based on sliding scale   levocetirizine 5 MG tablet Commonly known as:  XYZAL Take 5 mg by mouth daily.   MAGNESIUM PO Take 1 tablet by mouth daily with lunch.   Melatonin 10 MG Tabs Take 20 mg by mouth at bedtime as needed (for sleep.).   metoprolol tartrate 50 MG tablet Commonly known as:  LOPRESSOR Take 50 mg by mouth 3 (three) times daily.   montelukast 10 MG tablet Commonly known as:  SINGULAIR Take 10 mg by mouth daily as needed (wheezing/chest tightening.).   NOVOLIN N 100 UNIT/ML injection Generic drug:  insulin NPH Human Inject 0-7 Units into the skin 2 (two) times daily. Sliding scale insulin   olopatadine 0.1 % ophthalmic solution Commonly known as:  PATANOL Place 2 drops into both eyes See admin instructions. Instill 2 drops into both eyes (scheduled) in the morning & as needed in the evening.   omeprazole 20 MG capsule Commonly known as:  PRILOSEC Take 20 mg by mouth daily with supper.   ondansetron 4 MG tablet Commonly known as:  ZOFRAN Take 4 mg by mouth 2 (two) times daily as needed. For nausea/vomiting prevention with pain medication.   oxyCODONE 5 MG immediate release tablet Commonly known as:  Oxy IR/ROXICODONE Take 1-2 tablets (5-10 mg total) by mouth every 4 (four) hours as needed for moderate pain or severe pain.   potassium chloride SA 20 MEQ tablet Commonly known as:  K-DUR,KLOR-CON Take 20 mEq  by mouth daily with lunch.   propylthiouracil 50 MG tablet Commonly known as:  PTU Take 100 mg by mouth 3 (three) times daily.   rOPINIRole 2 MG tablet Commonly known as:  REQUIP Take 2 mg by mouth See admin instructions. Up to 4 times daily as needed for restless leg syndrome--take scheduled 3 times daily       Diagnostic Studies: Dg Shoulder Right Port  Result Date: 07/21/2017 CLINICAL DATA:  Total right shoulder replacement. EXAM: PORTABLE RIGHT SHOULDER COMPARISON:  CT 06/13/2017. FINDINGS: Total right shoulder replacement. Hardware intact. Anatomic alignment on one view. No acute bony abnormality. Acromioclavicular degenerative change. IMPRESSION: Total right shoulder replacement with anatomic alignment. Electronically Signed   By: [place]   On: 07/21/2017 10:19    Disposition: Discharge disposition: 01-Home or Self Care       Discharge Instructions    Discharge patient   Complete by:  As directed    Pt has [facility] set up for 24hr care and is safe to D/C home   Discharge disposition:  01-Home or Self Care   Discharge patient date:  07/23/2017     POD #2 s/p right reverse shoulder arthroplasty by Dr. Tamera Punt  With good progress in physical therapy and occupational therapy.  - Discharge home with 24-hour home health and physical therapy, already set up - up with PT/OT, encourage ambulation - Continue current px regimen - d/c home today with 24hr HH, f/u in 2 weeks  Signed: Justice Britain 07/27/2017, 1:22 PM

## 2017-07-29 DIAGNOSIS — E1140 Type 2 diabetes mellitus with diabetic neuropathy, unspecified: Secondary | ICD-10-CM | POA: Diagnosis not present

## 2017-07-29 DIAGNOSIS — M1990 Unspecified osteoarthritis, unspecified site: Secondary | ICD-10-CM | POA: Diagnosis not present

## 2017-07-29 DIAGNOSIS — Z471 Aftercare following joint replacement surgery: Secondary | ICD-10-CM | POA: Diagnosis not present

## 2017-07-29 DIAGNOSIS — D649 Anemia, unspecified: Secondary | ICD-10-CM | POA: Diagnosis not present

## 2017-07-29 DIAGNOSIS — M797 Fibromyalgia: Secondary | ICD-10-CM | POA: Diagnosis not present

## 2017-07-29 DIAGNOSIS — I10 Essential (primary) hypertension: Secondary | ICD-10-CM | POA: Diagnosis not present

## 2017-08-02 DIAGNOSIS — D649 Anemia, unspecified: Secondary | ICD-10-CM | POA: Diagnosis not present

## 2017-08-02 DIAGNOSIS — E1140 Type 2 diabetes mellitus with diabetic neuropathy, unspecified: Secondary | ICD-10-CM | POA: Diagnosis not present

## 2017-08-02 DIAGNOSIS — Z471 Aftercare following joint replacement surgery: Secondary | ICD-10-CM | POA: Diagnosis not present

## 2017-08-02 DIAGNOSIS — I10 Essential (primary) hypertension: Secondary | ICD-10-CM | POA: Diagnosis not present

## 2017-08-02 DIAGNOSIS — M1990 Unspecified osteoarthritis, unspecified site: Secondary | ICD-10-CM | POA: Diagnosis not present

## 2017-08-02 DIAGNOSIS — M797 Fibromyalgia: Secondary | ICD-10-CM | POA: Diagnosis not present

## 2017-08-03 DIAGNOSIS — M797 Fibromyalgia: Secondary | ICD-10-CM | POA: Diagnosis not present

## 2017-08-03 DIAGNOSIS — M24411 Recurrent dislocation, right shoulder: Secondary | ICD-10-CM | POA: Diagnosis not present

## 2017-08-03 DIAGNOSIS — I10 Essential (primary) hypertension: Secondary | ICD-10-CM | POA: Diagnosis not present

## 2017-08-03 DIAGNOSIS — Z96611 Presence of right artificial shoulder joint: Secondary | ICD-10-CM | POA: Diagnosis not present

## 2017-08-03 DIAGNOSIS — Z471 Aftercare following joint replacement surgery: Secondary | ICD-10-CM | POA: Diagnosis not present

## 2017-08-03 DIAGNOSIS — M1990 Unspecified osteoarthritis, unspecified site: Secondary | ICD-10-CM | POA: Diagnosis not present

## 2017-08-03 DIAGNOSIS — D649 Anemia, unspecified: Secondary | ICD-10-CM | POA: Diagnosis not present

## 2017-08-03 DIAGNOSIS — E1140 Type 2 diabetes mellitus with diabetic neuropathy, unspecified: Secondary | ICD-10-CM | POA: Diagnosis not present

## 2017-08-06 DIAGNOSIS — Z471 Aftercare following joint replacement surgery: Secondary | ICD-10-CM | POA: Diagnosis not present

## 2017-08-06 DIAGNOSIS — M797 Fibromyalgia: Secondary | ICD-10-CM | POA: Diagnosis not present

## 2017-08-06 DIAGNOSIS — I10 Essential (primary) hypertension: Secondary | ICD-10-CM | POA: Diagnosis not present

## 2017-08-06 DIAGNOSIS — M1990 Unspecified osteoarthritis, unspecified site: Secondary | ICD-10-CM | POA: Diagnosis not present

## 2017-08-06 DIAGNOSIS — D649 Anemia, unspecified: Secondary | ICD-10-CM | POA: Diagnosis not present

## 2017-08-06 DIAGNOSIS — E1140 Type 2 diabetes mellitus with diabetic neuropathy, unspecified: Secondary | ICD-10-CM | POA: Diagnosis not present

## 2017-08-12 DIAGNOSIS — M24811 Other specific joint derangements of right shoulder, not elsewhere classified: Secondary | ICD-10-CM | POA: Diagnosis not present

## 2017-08-12 DIAGNOSIS — M24411 Recurrent dislocation, right shoulder: Secondary | ICD-10-CM | POA: Diagnosis not present

## 2017-08-12 DIAGNOSIS — E038 Other specified hypothyroidism: Secondary | ICD-10-CM | POA: Diagnosis not present

## 2017-08-12 DIAGNOSIS — Z471 Aftercare following joint replacement surgery: Secondary | ICD-10-CM | POA: Diagnosis not present

## 2017-08-12 DIAGNOSIS — S82831A Other fracture of upper and lower end of right fibula, initial encounter for closed fracture: Secondary | ICD-10-CM | POA: Diagnosis not present

## 2017-08-12 DIAGNOSIS — Z96611 Presence of right artificial shoulder joint: Secondary | ICD-10-CM | POA: Diagnosis not present

## 2017-08-16 DIAGNOSIS — M48062 Spinal stenosis, lumbar region with neurogenic claudication: Secondary | ICD-10-CM | POA: Diagnosis not present

## 2017-08-23 DIAGNOSIS — N390 Urinary tract infection, site not specified: Secondary | ICD-10-CM | POA: Diagnosis not present

## 2017-08-23 DIAGNOSIS — E0590 Thyrotoxicosis, unspecified without thyrotoxic crisis or storm: Secondary | ICD-10-CM | POA: Diagnosis not present

## 2017-08-23 DIAGNOSIS — Z6841 Body Mass Index (BMI) 40.0 and over, adult: Secondary | ICD-10-CM | POA: Diagnosis not present

## 2017-08-23 DIAGNOSIS — F338 Other recurrent depressive disorders: Secondary | ICD-10-CM | POA: Diagnosis not present

## 2017-08-23 DIAGNOSIS — S82899S Other fracture of unspecified lower leg, sequela: Secondary | ICD-10-CM | POA: Diagnosis not present

## 2017-08-23 DIAGNOSIS — I10 Essential (primary) hypertension: Secondary | ICD-10-CM | POA: Diagnosis not present

## 2017-08-23 DIAGNOSIS — E1169 Type 2 diabetes mellitus with other specified complication: Secondary | ICD-10-CM | POA: Diagnosis not present

## 2017-08-31 DIAGNOSIS — M25571 Pain in right ankle and joints of right foot: Secondary | ICD-10-CM | POA: Diagnosis not present

## 2017-08-31 DIAGNOSIS — M24411 Recurrent dislocation, right shoulder: Secondary | ICD-10-CM | POA: Diagnosis not present

## 2017-08-31 DIAGNOSIS — Z471 Aftercare following joint replacement surgery: Secondary | ICD-10-CM | POA: Diagnosis not present

## 2017-08-31 DIAGNOSIS — Z96611 Presence of right artificial shoulder joint: Secondary | ICD-10-CM | POA: Diagnosis not present

## 2017-09-01 DIAGNOSIS — M25511 Pain in right shoulder: Secondary | ICD-10-CM | POA: Diagnosis not present

## 2017-09-01 DIAGNOSIS — Z96611 Presence of right artificial shoulder joint: Secondary | ICD-10-CM | POA: Diagnosis not present

## 2017-09-05 DIAGNOSIS — M25511 Pain in right shoulder: Secondary | ICD-10-CM | POA: Diagnosis not present

## 2017-09-05 DIAGNOSIS — Z96611 Presence of right artificial shoulder joint: Secondary | ICD-10-CM | POA: Diagnosis not present

## 2017-09-07 DIAGNOSIS — Z96611 Presence of right artificial shoulder joint: Secondary | ICD-10-CM | POA: Diagnosis not present

## 2017-09-07 DIAGNOSIS — M25511 Pain in right shoulder: Secondary | ICD-10-CM | POA: Diagnosis not present

## 2017-09-13 DIAGNOSIS — Z96611 Presence of right artificial shoulder joint: Secondary | ICD-10-CM | POA: Diagnosis not present

## 2017-09-13 DIAGNOSIS — M25511 Pain in right shoulder: Secondary | ICD-10-CM | POA: Diagnosis not present

## 2017-09-15 DIAGNOSIS — E7849 Other hyperlipidemia: Secondary | ICD-10-CM | POA: Diagnosis not present

## 2017-09-15 DIAGNOSIS — G4733 Obstructive sleep apnea (adult) (pediatric): Secondary | ICD-10-CM | POA: Diagnosis not present

## 2017-09-15 DIAGNOSIS — D72819 Decreased white blood cell count, unspecified: Secondary | ICD-10-CM | POA: Diagnosis not present

## 2017-09-15 DIAGNOSIS — I10 Essential (primary) hypertension: Secondary | ICD-10-CM | POA: Diagnosis not present

## 2017-09-15 DIAGNOSIS — Z1389 Encounter for screening for other disorder: Secondary | ICD-10-CM | POA: Diagnosis not present

## 2017-09-15 DIAGNOSIS — E0590 Thyrotoxicosis, unspecified without thyrotoxic crisis or storm: Secondary | ICD-10-CM | POA: Diagnosis not present

## 2017-09-15 DIAGNOSIS — N183 Chronic kidney disease, stage 3 (moderate): Secondary | ICD-10-CM | POA: Diagnosis not present

## 2017-09-15 DIAGNOSIS — E1129 Type 2 diabetes mellitus with other diabetic kidney complication: Secondary | ICD-10-CM | POA: Diagnosis not present

## 2017-09-15 DIAGNOSIS — F339 Major depressive disorder, recurrent, unspecified: Secondary | ICD-10-CM | POA: Diagnosis not present

## 2017-09-15 DIAGNOSIS — M25511 Pain in right shoulder: Secondary | ICD-10-CM | POA: Diagnosis not present

## 2017-09-15 DIAGNOSIS — Z96611 Presence of right artificial shoulder joint: Secondary | ICD-10-CM | POA: Diagnosis not present

## 2017-09-15 DIAGNOSIS — Z6841 Body Mass Index (BMI) 40.0 and over, adult: Secondary | ICD-10-CM | POA: Diagnosis not present

## 2017-09-19 DIAGNOSIS — Z96611 Presence of right artificial shoulder joint: Secondary | ICD-10-CM | POA: Diagnosis not present

## 2017-09-19 DIAGNOSIS — M25511 Pain in right shoulder: Secondary | ICD-10-CM | POA: Diagnosis not present

## 2017-09-21 DIAGNOSIS — Z96611 Presence of right artificial shoulder joint: Secondary | ICD-10-CM | POA: Diagnosis not present

## 2017-09-21 DIAGNOSIS — M25571 Pain in right ankle and joints of right foot: Secondary | ICD-10-CM | POA: Diagnosis not present

## 2017-09-21 DIAGNOSIS — Z9889 Other specified postprocedural states: Secondary | ICD-10-CM | POA: Diagnosis not present

## 2017-09-21 DIAGNOSIS — M25511 Pain in right shoulder: Secondary | ICD-10-CM | POA: Diagnosis not present

## 2017-09-28 DIAGNOSIS — M25571 Pain in right ankle and joints of right foot: Secondary | ICD-10-CM | POA: Diagnosis not present

## 2017-09-28 DIAGNOSIS — M25511 Pain in right shoulder: Secondary | ICD-10-CM | POA: Diagnosis not present

## 2017-10-04 DIAGNOSIS — Z96611 Presence of right artificial shoulder joint: Secondary | ICD-10-CM | POA: Diagnosis not present

## 2017-10-04 DIAGNOSIS — Z13820 Encounter for screening for osteoporosis: Secondary | ICD-10-CM | POA: Diagnosis not present

## 2017-10-04 DIAGNOSIS — M25511 Pain in right shoulder: Secondary | ICD-10-CM | POA: Diagnosis not present

## 2017-10-06 DIAGNOSIS — Z96611 Presence of right artificial shoulder joint: Secondary | ICD-10-CM | POA: Diagnosis not present

## 2017-10-06 DIAGNOSIS — M25511 Pain in right shoulder: Secondary | ICD-10-CM | POA: Diagnosis not present

## 2017-10-18 DIAGNOSIS — Z96611 Presence of right artificial shoulder joint: Secondary | ICD-10-CM | POA: Diagnosis not present

## 2017-10-18 DIAGNOSIS — M25511 Pain in right shoulder: Secondary | ICD-10-CM | POA: Diagnosis not present

## 2017-10-27 DIAGNOSIS — Z96611 Presence of right artificial shoulder joint: Secondary | ICD-10-CM | POA: Diagnosis not present

## 2017-10-27 DIAGNOSIS — M25511 Pain in right shoulder: Secondary | ICD-10-CM | POA: Diagnosis not present

## 2017-11-02 DIAGNOSIS — M25511 Pain in right shoulder: Secondary | ICD-10-CM | POA: Diagnosis not present

## 2017-11-07 DIAGNOSIS — Z6841 Body Mass Index (BMI) 40.0 and over, adult: Secondary | ICD-10-CM | POA: Diagnosis not present

## 2017-11-07 DIAGNOSIS — E039 Hypothyroidism, unspecified: Secondary | ICD-10-CM | POA: Diagnosis not present

## 2017-11-07 DIAGNOSIS — G4709 Other insomnia: Secondary | ICD-10-CM | POA: Diagnosis not present

## 2017-11-07 DIAGNOSIS — E538 Deficiency of other specified B group vitamins: Secondary | ICD-10-CM | POA: Diagnosis not present

## 2017-11-07 DIAGNOSIS — G2581 Restless legs syndrome: Secondary | ICD-10-CM | POA: Diagnosis not present

## 2017-11-07 DIAGNOSIS — R443 Hallucinations, unspecified: Secondary | ICD-10-CM | POA: Diagnosis not present

## 2017-11-07 DIAGNOSIS — E038 Other specified hypothyroidism: Secondary | ICD-10-CM | POA: Diagnosis not present

## 2017-11-07 DIAGNOSIS — F418 Other specified anxiety disorders: Secondary | ICD-10-CM | POA: Diagnosis not present

## 2017-11-07 DIAGNOSIS — F339 Major depressive disorder, recurrent, unspecified: Secondary | ICD-10-CM | POA: Diagnosis not present

## 2017-11-07 DIAGNOSIS — I10 Essential (primary) hypertension: Secondary | ICD-10-CM | POA: Diagnosis not present

## 2017-11-15 DIAGNOSIS — M48062 Spinal stenosis, lumbar region with neurogenic claudication: Secondary | ICD-10-CM | POA: Diagnosis not present

## 2017-11-17 DIAGNOSIS — G2581 Restless legs syndrome: Secondary | ICD-10-CM | POA: Diagnosis not present

## 2017-11-17 DIAGNOSIS — I10 Essential (primary) hypertension: Secondary | ICD-10-CM | POA: Diagnosis not present

## 2017-11-17 DIAGNOSIS — N183 Chronic kidney disease, stage 3 (moderate): Secondary | ICD-10-CM | POA: Diagnosis not present

## 2017-11-17 DIAGNOSIS — F339 Major depressive disorder, recurrent, unspecified: Secondary | ICD-10-CM | POA: Diagnosis not present

## 2017-11-17 DIAGNOSIS — Z23 Encounter for immunization: Secondary | ICD-10-CM | POA: Diagnosis not present

## 2017-11-17 DIAGNOSIS — E538 Deficiency of other specified B group vitamins: Secondary | ICD-10-CM | POA: Diagnosis not present

## 2017-11-17 DIAGNOSIS — E1169 Type 2 diabetes mellitus with other specified complication: Secondary | ICD-10-CM | POA: Diagnosis not present

## 2017-11-17 DIAGNOSIS — Z6841 Body Mass Index (BMI) 40.0 and over, adult: Secondary | ICD-10-CM | POA: Diagnosis not present

## 2017-11-17 DIAGNOSIS — R443 Hallucinations, unspecified: Secondary | ICD-10-CM | POA: Diagnosis not present

## 2017-11-22 ENCOUNTER — Ambulatory Visit (INDEPENDENT_AMBULATORY_CARE_PROVIDER_SITE_OTHER): Payer: Medicare Other | Admitting: Licensed Clinical Social Worker

## 2017-11-22 DIAGNOSIS — F411 Generalized anxiety disorder: Secondary | ICD-10-CM

## 2017-11-22 DIAGNOSIS — F332 Major depressive disorder, recurrent severe without psychotic features: Secondary | ICD-10-CM | POA: Diagnosis not present
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
## 2017-11-25 ENCOUNTER — Ambulatory Visit: Payer: Medicare Other | Admitting: Psychology

## 2017-11-29 ENCOUNTER — Ambulatory Visit (INDEPENDENT_AMBULATORY_CARE_PROVIDER_SITE_OTHER): Payer: Medicare Other | Admitting: Licensed Clinical Social Worker

## 2017-11-29 DIAGNOSIS — F332 Major depressive disorder, recurrent severe without psychotic features: Secondary | ICD-10-CM | POA: Diagnosis not present
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
## 2017-12-13 ENCOUNTER — Ambulatory Visit (HOSPITAL_COMMUNITY): Payer: Self-pay | Admitting: Licensed Clinical Social Worker
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
## 2017-12-21 DIAGNOSIS — M5126 Other intervertebral disc displacement, lumbar region: Secondary | ICD-10-CM | POA: Diagnosis not present

## 2017-12-21 DIAGNOSIS — M4120 Other idiopathic scoliosis, site unspecified: Secondary | ICD-10-CM | POA: Diagnosis not present

## 2017-12-21 DIAGNOSIS — M4316 Spondylolisthesis, lumbar region: Secondary | ICD-10-CM | POA: Diagnosis not present

## 2017-12-21 DIAGNOSIS — M5416 Radiculopathy, lumbar region: Secondary | ICD-10-CM | POA: Diagnosis not present

## 2017-12-21 DIAGNOSIS — M545 Low back pain: Secondary | ICD-10-CM | POA: Diagnosis not present

## 2017-12-21 DIAGNOSIS — M48062 Spinal stenosis, lumbar region with neurogenic claudication: Secondary | ICD-10-CM | POA: Diagnosis not present

## 2017-12-23 ENCOUNTER — Other Ambulatory Visit: Payer: Self-pay | Admitting: Neurosurgery

## 2017-12-23 DIAGNOSIS — M5416 Radiculopathy, lumbar region: Secondary | ICD-10-CM

## 2017-12-24 ENCOUNTER — Encounter (HOSPITAL_COMMUNITY): Payer: Self-pay | Admitting: Psychiatry

## 2017-12-24 ENCOUNTER — Other Ambulatory Visit: Payer: Self-pay

## 2017-12-24 ENCOUNTER — Ambulatory Visit (INDEPENDENT_AMBULATORY_CARE_PROVIDER_SITE_OTHER): Payer: Medicare Other | Admitting: Psychiatry

## 2017-12-24 VITALS — BP 167/74 | HR 68 | Ht 61.0 in | Wt 266.6 lb

## 2017-12-24 DIAGNOSIS — F419 Anxiety disorder, unspecified: Secondary | ICD-10-CM

## 2017-12-24 DIAGNOSIS — G4700 Insomnia, unspecified: Secondary | ICD-10-CM

## 2017-12-24 DIAGNOSIS — F331 Major depressive disorder, recurrent, moderate: Secondary | ICD-10-CM

## 2017-12-24 MED ORDER — ARIPIPRAZOLE 5 MG PO TABS: 5.0000 mg | ORAL_TABLET | Freq: Every day | ORAL | 1 refills | Status: DC

## 2017-12-24 NOTE — Patient Instructions (Signed)
1. Discontinue olanzapine  2. Start Abilify 5 mg daily  3. Continue fluoxetine 60 mg daily  4. Return to clinic in one month for 30 mins

## 2017-12-25 ENCOUNTER — Ambulatory Visit
Admission: RE | Admit: 2017-12-25 | Discharge: 2017-12-25 | Disposition: A | Discharge status: Home / Self Care | Payer: Medicare Other | Source: Ambulatory Visit | Attending: Neurosurgery | Admitting: Neurosurgery

## 2017-12-25 DIAGNOSIS — M5416 Radiculopathy, lumbar region: Secondary | ICD-10-CM

## 2017-12-25 DIAGNOSIS — M48061 Spinal stenosis, lumbar region without neurogenic claudication: Secondary | ICD-10-CM | POA: Diagnosis not present

## 2018-01-02 DIAGNOSIS — F339 Major depressive disorder, recurrent, unspecified: Secondary | ICD-10-CM | POA: Diagnosis not present

## 2018-01-02 DIAGNOSIS — E0590 Thyrotoxicosis, unspecified without thyrotoxic crisis or storm: Secondary | ICD-10-CM | POA: Diagnosis not present

## 2018-01-02 DIAGNOSIS — Z9989 Dependence on other enabling machines and devices: Secondary | ICD-10-CM | POA: Diagnosis not present

## 2018-01-02 DIAGNOSIS — R5383 Other fatigue: Secondary | ICD-10-CM | POA: Diagnosis not present

## 2018-01-02 DIAGNOSIS — I10 Essential (primary) hypertension: Secondary | ICD-10-CM | POA: Diagnosis not present

## 2018-01-02 DIAGNOSIS — N3941 Urge incontinence: Secondary | ICD-10-CM | POA: Diagnosis not present

## 2018-01-02 DIAGNOSIS — E559 Vitamin D deficiency, unspecified: Secondary | ICD-10-CM | POA: Diagnosis not present

## 2018-01-18 DIAGNOSIS — M4120 Other idiopathic scoliosis, site unspecified: Secondary | ICD-10-CM | POA: Diagnosis not present

## 2018-01-18 DIAGNOSIS — I10 Essential (primary) hypertension: Secondary | ICD-10-CM | POA: Diagnosis not present

## 2018-01-18 DIAGNOSIS — M4316 Spondylolisthesis, lumbar region: Secondary | ICD-10-CM | POA: Diagnosis not present

## 2018-01-18 DIAGNOSIS — Z6841 Body Mass Index (BMI) 40.0 and over, adult: Secondary | ICD-10-CM | POA: Diagnosis not present

## 2018-01-23 ENCOUNTER — Encounter: Payer: Self-pay | Admitting: Neurology

## 2018-01-24 ENCOUNTER — Encounter: Payer: Self-pay | Admitting: Neurology

## 2018-01-24 ENCOUNTER — Ambulatory Visit (INDEPENDENT_AMBULATORY_CARE_PROVIDER_SITE_OTHER): Payer: Medicare Other | Admitting: Neurology

## 2018-01-24 VITALS — BP 139/57 | HR 70 | Ht 61.0 in | Wt 245.0 lb

## 2018-01-24 DIAGNOSIS — Z6841 Body Mass Index (BMI) 40.0 and over, adult: Secondary | ICD-10-CM

## 2018-01-24 DIAGNOSIS — F23 Brief psychotic disorder: Secondary | ICD-10-CM | POA: Insufficient documentation

## 2018-01-24 DIAGNOSIS — E662 Morbid (severe) obesity with alveolar hypoventilation: Secondary | ICD-10-CM | POA: Diagnosis not present

## 2018-01-24 DIAGNOSIS — R0601 Orthopnea: Secondary | ICD-10-CM | POA: Insufficient documentation

## 2018-01-24 DIAGNOSIS — Z9989 Dependence on other enabling machines and devices: Secondary | ICD-10-CM | POA: Insufficient documentation

## 2018-01-24 DIAGNOSIS — E661 Drug-induced obesity: Secondary | ICD-10-CM

## 2018-01-24 DIAGNOSIS — F3112 Bipolar disorder, current episode manic without psychotic features, moderate: Secondary | ICD-10-CM | POA: Insufficient documentation
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
## 2018-01-24 NOTE — Patient Instructions (Signed)
Please remember to try to maintain good sleep hygiene, which means: Keep a regular sleep and wake schedule, try not to exercise or have a meal within 2 hours of your bedtime, try to keep your bedroom conducive for sleep, that is, cool and dark, without light distractors such as an illuminated alarm clock, and refrain from watching TV right before sleep or in the middle of the night and do not keep the TV or radio on during the night. Also, try not to use or play on electronic devices at bedtime, such as your cell phone, tablet PC or laptop. If you like to read at bedtime on an electronic device, try to dim the background light as much as possible. Do not eat in the middle of the night.   We will request a sleep study.    We will look for leg twitching and snoring or sleep apnea.   For chronic insomnia, you are best followed by a psychiatrist and/or sleep psychologist.     

## 2018-01-25 DIAGNOSIS — M1811 Unilateral primary osteoarthritis of first carpometacarpal joint, right hand: Secondary | ICD-10-CM | POA: Diagnosis not present

## 2018-01-26 DIAGNOSIS — D6489 Other specified anemias: Secondary | ICD-10-CM | POA: Diagnosis not present

## 2018-01-26 DIAGNOSIS — Z9989 Dependence on other enabling machines and devices: Secondary | ICD-10-CM | POA: Diagnosis not present

## 2018-01-26 DIAGNOSIS — G2581 Restless legs syndrome: Secondary | ICD-10-CM | POA: Diagnosis not present

## 2018-01-26 DIAGNOSIS — E559 Vitamin D deficiency, unspecified: Secondary | ICD-10-CM | POA: Diagnosis not present

## 2018-01-26 DIAGNOSIS — F339 Major depressive disorder, recurrent, unspecified: Secondary | ICD-10-CM | POA: Diagnosis not present

## 2018-01-26 DIAGNOSIS — E538 Deficiency of other specified B group vitamins: Secondary | ICD-10-CM | POA: Diagnosis not present

## 2018-01-26 DIAGNOSIS — R443 Hallucinations, unspecified: Secondary | ICD-10-CM | POA: Diagnosis not present

## 2018-01-26 DIAGNOSIS — I10 Essential (primary) hypertension: Secondary | ICD-10-CM | POA: Diagnosis not present

## 2018-01-26 DIAGNOSIS — Z6841 Body Mass Index (BMI) 40.0 and over, adult: Secondary | ICD-10-CM | POA: Diagnosis not present

## 2018-01-26 DIAGNOSIS — E0590 Thyrotoxicosis, unspecified without thyrotoxic crisis or storm: Secondary | ICD-10-CM | POA: Diagnosis not present

## 2018-01-26 DIAGNOSIS — E1129 Type 2 diabetes mellitus with other diabetic kidney complication: Secondary | ICD-10-CM | POA: Diagnosis not present

## 2018-02-07 DIAGNOSIS — M4316 Spondylolisthesis, lumbar region: Secondary | ICD-10-CM | POA: Diagnosis not present

## 2018-02-08 DIAGNOSIS — M4316 Spondylolisthesis, lumbar region: Secondary | ICD-10-CM | POA: Diagnosis not present

## 2018-02-10 DIAGNOSIS — M25571 Pain in right ankle and joints of right foot: Secondary | ICD-10-CM | POA: Diagnosis not present

## 2018-02-10 DIAGNOSIS — Z9889 Other specified postprocedural states: Secondary | ICD-10-CM | POA: Diagnosis not present

## 2018-02-10 DIAGNOSIS — M25511 Pain in right shoulder: Secondary | ICD-10-CM | POA: Diagnosis not present

## 2018-02-23 DIAGNOSIS — M4316 Spondylolisthesis, lumbar region: Secondary | ICD-10-CM | POA: Diagnosis not present

## 2018-02-28 ENCOUNTER — Encounter

## 2018-03-01 ENCOUNTER — Institutional Professional Consult (permissible substitution): Payer: Self-pay | Admitting: Neurology

## 2018-03-01 DIAGNOSIS — M4316 Spondylolisthesis, lumbar region: Secondary | ICD-10-CM | POA: Diagnosis not present

## 2018-03-02 DIAGNOSIS — M5416 Radiculopathy, lumbar region: Secondary | ICD-10-CM | POA: Diagnosis not present

## 2018-03-07 DIAGNOSIS — M4316 Spondylolisthesis, lumbar region: Secondary | ICD-10-CM | POA: Diagnosis not present

## 2018-03-08 ENCOUNTER — Encounter: Payer: Medicare Other | Admitting: Neurology

## 2018-03-08 DIAGNOSIS — E1129 Type 2 diabetes mellitus with other diabetic kidney complication: Secondary | ICD-10-CM | POA: Diagnosis not present

## 2018-03-08 DIAGNOSIS — I10 Essential (primary) hypertension: Secondary | ICD-10-CM | POA: Diagnosis not present

## 2018-03-08 DIAGNOSIS — E538 Deficiency of other specified B group vitamins: Secondary | ICD-10-CM | POA: Diagnosis not present

## 2018-03-08 DIAGNOSIS — E0590 Thyrotoxicosis, unspecified without thyrotoxic crisis or storm: Secondary | ICD-10-CM | POA: Diagnosis not present

## 2018-03-08 DIAGNOSIS — Z6841 Body Mass Index (BMI) 40.0 and over, adult: Secondary | ICD-10-CM | POA: Diagnosis not present

## 2018-03-08 DIAGNOSIS — E559 Vitamin D deficiency, unspecified: Secondary | ICD-10-CM | POA: Diagnosis not present

## 2018-03-08 DIAGNOSIS — N183 Chronic kidney disease, stage 3 (moderate): Secondary | ICD-10-CM | POA: Diagnosis not present

## 2018-03-14 DIAGNOSIS — M4316 Spondylolisthesis, lumbar region: Secondary | ICD-10-CM | POA: Diagnosis not present

## 2018-03-23 ENCOUNTER — Telehealth: Payer: Self-pay | Admitting: Neurology

## 2018-03-23 NOTE — Telephone Encounter (Signed)
Checked with sleep lab since the study was completed on 03/08/18 and they stated they need the patient to repeat the test and have been waiting on the patient to call. Will send this note to the sleep lab tech to get the patient rescheduled.

## 2018-03-23 NOTE — Telephone Encounter (Signed)
LVM for pt to call me back to reschedule HST.

## 2018-03-23 NOTE — Telephone Encounter (Signed)
Pt is would like in home sleep test. She is aware it can take longer to get those results and also Dr D has been on vacation as well as due to the holidays our clinic has been closed 2 days last week and 2 days this week. Please call to advise when results are in.

## 2018-03-27 DIAGNOSIS — M4316 Spondylolisthesis, lumbar region: Secondary | ICD-10-CM | POA: Diagnosis not present

## 2018-04-07 DIAGNOSIS — M1712 Unilateral primary osteoarthritis, left knee: Secondary | ICD-10-CM | POA: Diagnosis not present

## 2018-04-07 DIAGNOSIS — M1711 Unilateral primary osteoarthritis, right knee: Secondary | ICD-10-CM | POA: Diagnosis not present

## 2018-04-17 ENCOUNTER — Encounter: Payer: Medicare Other | Admitting: Neurology

## 2018-04-25 ENCOUNTER — Telehealth: Payer: Self-pay

## 2018-04-25 ENCOUNTER — Other Ambulatory Visit: Payer: Self-pay | Admitting: Neurology

## 2018-04-25 DIAGNOSIS — Z6841 Body Mass Index (BMI) 40.0 and over, adult: Secondary | ICD-10-CM

## 2018-04-25 DIAGNOSIS — E661 Drug-induced obesity: Secondary | ICD-10-CM

## 2018-04-25 DIAGNOSIS — R0601 Orthopnea: Secondary | ICD-10-CM

## 2018-04-25 DIAGNOSIS — E662 Morbid (severe) obesity with alveolar hypoventilation: Secondary | ICD-10-CM

## 2018-04-25 DIAGNOSIS — Z9989 Dependence on other enabling machines and devices: Secondary | ICD-10-CM

## 2018-04-25 NOTE — Telephone Encounter (Signed)
Order placed for sleep study.

## 2018-04-25 NOTE — Telephone Encounter (Signed)
Pt has had 2 failed home sleep tests. We can now get patient in for an in lab sleep study with insurance approval. Can I get an order for PSG for pt? Thanks

## 2018-05-03 DIAGNOSIS — Z6841 Body Mass Index (BMI) 40.0 and over, adult: Secondary | ICD-10-CM | POA: Diagnosis not present

## 2018-05-03 DIAGNOSIS — M545 Low back pain: Secondary | ICD-10-CM | POA: Diagnosis not present

## 2018-05-03 DIAGNOSIS — M4120 Other idiopathic scoliosis, site unspecified: Secondary | ICD-10-CM | POA: Diagnosis not present

## 2018-05-03 DIAGNOSIS — M5416 Radiculopathy, lumbar region: Secondary | ICD-10-CM | POA: Diagnosis not present

## 2018-05-03 DIAGNOSIS — M4316 Spondylolisthesis, lumbar region: Secondary | ICD-10-CM | POA: Diagnosis not present

## 2018-05-09 DIAGNOSIS — R2681 Unsteadiness on feet: Secondary | ICD-10-CM | POA: Diagnosis not present

## 2018-05-09 DIAGNOSIS — M545 Low back pain: Secondary | ICD-10-CM | POA: Diagnosis not present

## 2018-05-09 DIAGNOSIS — M6281 Muscle weakness (generalized): Secondary | ICD-10-CM | POA: Diagnosis not present

## 2018-05-09 DIAGNOSIS — M5416 Radiculopathy, lumbar region: Secondary | ICD-10-CM | POA: Diagnosis not present

## 2018-05-09 DIAGNOSIS — R296 Repeated falls: Secondary | ICD-10-CM | POA: Diagnosis not present

## 2018-05-11 DIAGNOSIS — R2681 Unsteadiness on feet: Secondary | ICD-10-CM | POA: Diagnosis not present

## 2018-05-11 DIAGNOSIS — R296 Repeated falls: Secondary | ICD-10-CM | POA: Diagnosis not present

## 2018-05-11 DIAGNOSIS — M5416 Radiculopathy, lumbar region: Secondary | ICD-10-CM | POA: Diagnosis not present

## 2018-05-11 DIAGNOSIS — M545 Low back pain: Secondary | ICD-10-CM | POA: Diagnosis not present

## 2018-05-11 DIAGNOSIS — M6281 Muscle weakness (generalized): Secondary | ICD-10-CM | POA: Diagnosis not present

## 2018-05-15 DIAGNOSIS — M545 Low back pain: Secondary | ICD-10-CM | POA: Diagnosis not present

## 2018-05-15 DIAGNOSIS — Z6841 Body Mass Index (BMI) 40.0 and over, adult: Secondary | ICD-10-CM | POA: Diagnosis not present

## 2018-05-15 DIAGNOSIS — N183 Chronic kidney disease, stage 3 (moderate): Secondary | ICD-10-CM | POA: Diagnosis not present

## 2018-05-15 DIAGNOSIS — E559 Vitamin D deficiency, unspecified: Secondary | ICD-10-CM | POA: Diagnosis not present

## 2018-05-15 DIAGNOSIS — I10 Essential (primary) hypertension: Secondary | ICD-10-CM | POA: Diagnosis not present

## 2018-05-15 DIAGNOSIS — E1129 Type 2 diabetes mellitus with other diabetic kidney complication: Secondary | ICD-10-CM | POA: Diagnosis not present

## 2018-05-15 DIAGNOSIS — F339 Major depressive disorder, recurrent, unspecified: Secondary | ICD-10-CM | POA: Diagnosis not present

## 2018-05-15 DIAGNOSIS — G2581 Restless legs syndrome: Secondary | ICD-10-CM | POA: Diagnosis not present

## 2018-05-15 DIAGNOSIS — E0590 Thyrotoxicosis, unspecified without thyrotoxic crisis or storm: Secondary | ICD-10-CM | POA: Diagnosis not present

## 2018-05-15 DIAGNOSIS — G4733 Obstructive sleep apnea (adult) (pediatric): Secondary | ICD-10-CM | POA: Diagnosis not present

## 2018-05-15 DIAGNOSIS — E538 Deficiency of other specified B group vitamins: Secondary | ICD-10-CM | POA: Diagnosis not present

## 2018-05-17 DIAGNOSIS — R2681 Unsteadiness on feet: Secondary | ICD-10-CM | POA: Diagnosis not present

## 2018-05-17 DIAGNOSIS — M545 Low back pain: Secondary | ICD-10-CM | POA: Diagnosis not present

## 2018-05-17 DIAGNOSIS — M6281 Muscle weakness (generalized): Secondary | ICD-10-CM | POA: Diagnosis not present

## 2018-05-17 DIAGNOSIS — M5416 Radiculopathy, lumbar region: Secondary | ICD-10-CM | POA: Diagnosis not present

## 2018-05-17 DIAGNOSIS — R296 Repeated falls: Secondary | ICD-10-CM | POA: Diagnosis not present

## 2018-05-18 DIAGNOSIS — M6281 Muscle weakness (generalized): Secondary | ICD-10-CM | POA: Diagnosis not present

## 2018-05-18 DIAGNOSIS — M545 Low back pain: Secondary | ICD-10-CM | POA: Diagnosis not present

## 2018-05-18 DIAGNOSIS — R2681 Unsteadiness on feet: Secondary | ICD-10-CM | POA: Diagnosis not present

## 2018-05-18 DIAGNOSIS — M5416 Radiculopathy, lumbar region: Secondary | ICD-10-CM | POA: Diagnosis not present

## 2018-05-18 DIAGNOSIS — R296 Repeated falls: Secondary | ICD-10-CM | POA: Diagnosis not present

## 2018-05-23 DIAGNOSIS — M545 Low back pain: Secondary | ICD-10-CM | POA: Diagnosis not present

## 2018-05-24 DIAGNOSIS — M545 Low back pain: Secondary | ICD-10-CM | POA: Diagnosis not present

## 2018-05-24 DIAGNOSIS — R2681 Unsteadiness on feet: Secondary | ICD-10-CM | POA: Diagnosis not present

## 2018-05-24 DIAGNOSIS — R296 Repeated falls: Secondary | ICD-10-CM | POA: Diagnosis not present

## 2018-05-24 DIAGNOSIS — M5416 Radiculopathy, lumbar region: Secondary | ICD-10-CM | POA: Diagnosis not present

## 2018-05-24 DIAGNOSIS — M6281 Muscle weakness (generalized): Secondary | ICD-10-CM | POA: Diagnosis not present

## 2018-05-25 DIAGNOSIS — M545 Low back pain: Secondary | ICD-10-CM | POA: Diagnosis not present

## 2018-05-25 DIAGNOSIS — R296 Repeated falls: Secondary | ICD-10-CM | POA: Diagnosis not present

## 2018-05-25 DIAGNOSIS — M5416 Radiculopathy, lumbar region: Secondary | ICD-10-CM | POA: Diagnosis not present

## 2018-05-25 DIAGNOSIS — M6281 Muscle weakness (generalized): Secondary | ICD-10-CM | POA: Diagnosis not present

## 2018-05-25 DIAGNOSIS — R2681 Unsteadiness on feet: Secondary | ICD-10-CM | POA: Diagnosis not present

## 2018-05-29 DIAGNOSIS — N390 Urinary tract infection, site not specified: Secondary | ICD-10-CM | POA: Diagnosis not present

## 2018-05-29 DIAGNOSIS — R350 Frequency of micturition: Secondary | ICD-10-CM | POA: Diagnosis not present

## 2018-05-29 DIAGNOSIS — N183 Chronic kidney disease, stage 3 (moderate): Secondary | ICD-10-CM | POA: Diagnosis not present

## 2018-05-29 DIAGNOSIS — M545 Low back pain: Secondary | ICD-10-CM | POA: Diagnosis not present

## 2018-05-29 DIAGNOSIS — E0590 Thyrotoxicosis, unspecified without thyrotoxic crisis or storm: Secondary | ICD-10-CM | POA: Diagnosis not present

## 2018-05-30 DIAGNOSIS — N3020 Other chronic cystitis without hematuria: Secondary | ICD-10-CM | POA: Diagnosis not present

## 2018-05-30 DIAGNOSIS — N200 Calculus of kidney: Secondary | ICD-10-CM | POA: Diagnosis not present

## 2018-05-31 ENCOUNTER — Emergency Department (HOSPITAL_COMMUNITY)
Admission: EM | Admit: 2018-05-31 | Discharge: 2018-05-31 | Disposition: A | Discharge status: Home / Self Care | Payer: Medicare Other | Source: Home / Self Care | Attending: Emergency Medicine | Admitting: Emergency Medicine

## 2018-05-31 ENCOUNTER — Other Ambulatory Visit: Payer: Self-pay

## 2018-05-31 ENCOUNTER — Emergency Department (HOSPITAL_COMMUNITY): Payer: Medicare Other

## 2018-05-31 ENCOUNTER — Encounter (HOSPITAL_COMMUNITY): Payer: Self-pay

## 2018-05-31 DIAGNOSIS — K8689 Other specified diseases of pancreas: Secondary | ICD-10-CM

## 2018-05-31 DIAGNOSIS — J45909 Unspecified asthma, uncomplicated: Secondary | ICD-10-CM | POA: Insufficient documentation

## 2018-05-31 DIAGNOSIS — E039 Hypothyroidism, unspecified: Secondary | ICD-10-CM

## 2018-05-31 DIAGNOSIS — M549 Dorsalgia, unspecified: Secondary | ICD-10-CM

## 2018-05-31 DIAGNOSIS — Z794 Long term (current) use of insulin: Secondary | ICD-10-CM

## 2018-05-31 DIAGNOSIS — N3020 Other chronic cystitis without hematuria: Secondary | ICD-10-CM | POA: Diagnosis not present

## 2018-05-31 DIAGNOSIS — G061 Intraspinal abscess and granuloma: Secondary | ICD-10-CM | POA: Diagnosis not present

## 2018-05-31 DIAGNOSIS — Z1619 Resistance to other specified beta lactam antibiotics: Secondary | ICD-10-CM | POA: Diagnosis not present

## 2018-05-31 DIAGNOSIS — K869 Disease of pancreas, unspecified: Secondary | ICD-10-CM | POA: Diagnosis not present

## 2018-05-31 DIAGNOSIS — Z79899 Other long term (current) drug therapy: Secondary | ICD-10-CM

## 2018-05-31 DIAGNOSIS — E119 Type 2 diabetes mellitus without complications: Secondary | ICD-10-CM

## 2018-05-31 DIAGNOSIS — Z96651 Presence of right artificial knee joint: Secondary | ICD-10-CM

## 2018-05-31 DIAGNOSIS — I10 Essential (primary) hypertension: Secondary | ICD-10-CM

## 2018-05-31 DIAGNOSIS — T85735A Infection and inflammatory reaction due to cranial or spinal infusion catheter, initial encounter: Secondary | ICD-10-CM | POA: Diagnosis not present

## 2018-05-31 DIAGNOSIS — M4626 Osteomyelitis of vertebra, lumbar region: Secondary | ICD-10-CM | POA: Diagnosis not present

## 2018-05-31 DIAGNOSIS — R109 Unspecified abdominal pain: Secondary | ICD-10-CM | POA: Diagnosis not present

## 2018-05-31 DIAGNOSIS — M48061 Spinal stenosis, lumbar region without neurogenic claudication: Secondary | ICD-10-CM | POA: Insufficient documentation

## 2018-05-31 DIAGNOSIS — M545 Low back pain: Secondary | ICD-10-CM | POA: Diagnosis not present

## 2018-05-31 DIAGNOSIS — D631 Anemia in chronic kidney disease: Secondary | ICD-10-CM | POA: Diagnosis not present

## 2018-05-31 DIAGNOSIS — R1084 Generalized abdominal pain: Secondary | ICD-10-CM | POA: Diagnosis not present

## 2018-05-31 DIAGNOSIS — R110 Nausea: Secondary | ICD-10-CM | POA: Diagnosis not present

## 2018-05-31 DIAGNOSIS — Z6841 Body Mass Index (BMI) 40.0 and over, adult: Secondary | ICD-10-CM | POA: Diagnosis not present

## 2018-05-31 LAB — CBC
HCT: 31.7 % — ABNORMAL LOW (ref 36.0–46.0)
Hemoglobin: 9.5 g/dL — ABNORMAL LOW (ref 12.0–15.0)
MCH: 23.9 pg — ABNORMAL LOW (ref 26.0–34.0)
MCHC: 30 g/dL (ref 30.0–36.0)
MCV: 79.8 fL — ABNORMAL LOW (ref 80.0–100.0)
Platelets: 196 10*3/uL (ref 150–400)
RBC: 3.97 MIL/uL (ref 3.87–5.11)
RDW: 15.7 % — ABNORMAL HIGH (ref 11.5–15.5)
WBC: 3.8 10*3/uL — ABNORMAL LOW (ref 4.0–10.5)
nRBC: 0 % (ref 0.0–0.2)

## 2018-05-31 LAB — COMPREHENSIVE METABOLIC PANEL
ALT: 14 U/L (ref 0–44)
AST: 17 U/L (ref 15–41)
Albumin: 3.7 g/dL (ref 3.5–5.0)
Alkaline Phosphatase: 78 U/L (ref 38–126)
Anion gap: 10 (ref 5–15)
BUN: 36 mg/dL — ABNORMAL HIGH (ref 8–23)
CO2: 27 mmol/L (ref 22–32)
Calcium: 9.3 mg/dL (ref 8.9–10.3)
Chloride: 99 mmol/L (ref 98–111)
Creatinine, Ser: 1.16 mg/dL — ABNORMAL HIGH (ref 0.44–1.00)
GFR calc Af Amer: 57 mL/min — ABNORMAL LOW (ref >60)
GFR calc non Af Amer: 49 mL/min — ABNORMAL LOW (ref >60)
Glucose, Bld: 129 mg/dL — ABNORMAL HIGH (ref 70–99)
Potassium: 4.2 mmol/L (ref 3.5–5.1)
Sodium: 136 mmol/L (ref 135–145)
Total Bilirubin: 0.5 mg/dL (ref 0.3–1.2)
Total Protein: 7.7 g/dL (ref 6.5–8.1)

## 2018-05-31 LAB — LIPASE, BLOOD: Lipase: 23 U/L (ref 11–51)

## 2018-05-31 LAB — URINALYSIS, ROUTINE W REFLEX MICROSCOPIC
Bilirubin Urine: NEGATIVE
Glucose, UA: NEGATIVE mg/dL
Hgb urine dipstick: NEGATIVE
Ketones, ur: NEGATIVE mg/dL
Leukocytes,Ua: NEGATIVE
Nitrite: NEGATIVE
Protein, ur: NEGATIVE mg/dL
Specific Gravity, Urine: 1.015 (ref 1.005–1.030)
pH: 5 (ref 5.0–8.0)

## 2018-05-31 MED ORDER — IOHEXOL 300 MG/ML  SOLN: 100.0000 mL | Freq: Once | INTRAMUSCULAR | Status: DC | PRN

## 2018-05-31 MED ORDER — HYDROMORPHONE HCL 1 MG/ML IJ SOLN
1.0000 mg | Freq: Once | INTRAMUSCULAR | Status: AC
  Administered 2018-05-31: 1 mg via INTRAVENOUS
  Filled 2018-05-31: qty 1

## 2018-05-31 MED ORDER — IOPAMIDOL (ISOVUE-300) INJECTION 61%
INTRAVENOUS | Status: AC
  Filled 2018-05-31: qty 100

## 2018-05-31 MED ORDER — SODIUM CHLORIDE (PF) 0.9 % IJ SOLN
INTRAMUSCULAR | Status: AC
  Filled 2018-05-31: qty 50

## 2018-05-31 MED ORDER — ONDANSETRON HCL 4 MG/2ML IJ SOLN
4.0000 mg | Freq: Once | INTRAMUSCULAR | Status: AC
  Administered 2018-05-31: 4 mg via INTRAVENOUS
  Filled 2018-05-31: qty 2

## 2018-05-31 MED ORDER — SODIUM CHLORIDE 0.9% FLUSH: 3.0000 mL | Freq: Once | INTRAVENOUS | Status: DC

## 2018-05-31 MED ORDER — SODIUM CHLORIDE 0.9 % IV BOLUS
1000.0000 mL | Freq: Once | INTRAVENOUS | Status: AC
  Administered 2018-05-31: 1000 mL via INTRAVENOUS

## 2018-05-31 MED ORDER — IOPAMIDOL (ISOVUE-300) INJECTION 61%
100.0000 mL | Freq: Once | INTRAVENOUS | Status: AC | PRN
  Administered 2018-05-31: 100 mL via INTRAVENOUS
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
## 2018-05-31 NOTE — ED Triage Notes (Signed)
Pt states that she is having back and abd pain x 1 week. Pt states that she had an injection to her back prior to the pain starting, as well as participating in water aerobics. Pt states that the pain has impaired her daily life. Pt states she had a "kidney thing" done, and kidney issues ruled out. Pt states she was given macrobid for "borderline" UTI. Pt states when her workouts got harder, the pain got worse. Pt states that her abd area seems swollen as well.

## 2018-05-31 NOTE — ED Provider Notes (Signed)
  Ultrasound ED Peripheral IV (Provider) Date/Time: 05/31/2018 11:05 AM Performed by: Lorayne Bender, PA-C Authorized by: Drenda Freeze, MD   Procedure details:    Indications: multiple failed IV attempts and poor IV access     Skin Prep: chlorhexidine gluconate     Location:  Right AC   Angiocath:  20 G   Bedside Ultrasound Guided: Yes     Images: not archived     Patient tolerated procedure without complications: Yes     Dressing applied: Yes   Comments:     Positive flash.  Advanced and flushed without pain, swelling, or other signs of infiltration.     Lorayne Bender, PA-C 05/31/18 1111    Drenda Freeze, MD 05/31/18 [phone]

## 2018-05-31 NOTE — Discharge Instructions (Addendum)
You can take your vicodin for pain. You may take extra. Please tell Dr. Joylene Draft that you have a pancreatic mass. You will need MRI of your abdomen and possible endoscopy with Dr. Ardis Hughs to further evaluate it.   See Dr. Joylene Draft and Dr. Ardis Hughs for follow up   Return to ER if you have worse back pain, abdominal pain, vomiting, fever

## 2018-06-01 ENCOUNTER — Telehealth: Payer: Self-pay

## 2018-06-01 DIAGNOSIS — D509 Iron deficiency anemia, unspecified: Secondary | ICD-10-CM | POA: Diagnosis not present

## 2018-06-01 DIAGNOSIS — D508 Other iron deficiency anemias: Secondary | ICD-10-CM | POA: Diagnosis not present

## 2018-06-01 LAB — URINE CULTURE: Culture: NO GROWTH

## 2018-06-01 NOTE — Telephone Encounter (Signed)
Pt has called to let us know that a mass was found in her abdomen. States that she wants to take care of that with surgery and recovery first. She is wanting to cancel her sleep study with [place] on Sunday night. She will call back at later date to reschedule.

## 2018-06-02 ENCOUNTER — Emergency Department (HOSPITAL_COMMUNITY): Payer: Medicare Other

## 2018-06-02 ENCOUNTER — Encounter (HOSPITAL_COMMUNITY): Payer: Self-pay

## 2018-06-02 ENCOUNTER — Inpatient Hospital Stay (HOSPITAL_COMMUNITY)
Admission: EM | Admit: 2018-06-02 | Discharge: 2018-06-07 | DRG: 091 | Disposition: A | Discharge status: Home Health | Payer: Medicare Other | Attending: Internal Medicine | Admitting: Internal Medicine

## 2018-06-02 ENCOUNTER — Other Ambulatory Visit: Payer: Self-pay

## 2018-06-02 DIAGNOSIS — Z87442 Personal history of urinary calculi: Secondary | ICD-10-CM

## 2018-06-02 DIAGNOSIS — R110 Nausea: Secondary | ICD-10-CM | POA: Diagnosis not present

## 2018-06-02 DIAGNOSIS — Z794 Long term (current) use of insulin: Secondary | ICD-10-CM

## 2018-06-02 DIAGNOSIS — R1084 Generalized abdominal pain: Secondary | ICD-10-CM | POA: Diagnosis not present

## 2018-06-02 DIAGNOSIS — G4733 Obstructive sleep apnea (adult) (pediatric): Secondary | ICD-10-CM | POA: Diagnosis present

## 2018-06-02 DIAGNOSIS — E1122 Type 2 diabetes mellitus with diabetic chronic kidney disease: Secondary | ICD-10-CM | POA: Diagnosis present

## 2018-06-02 DIAGNOSIS — D509 Iron deficiency anemia, unspecified: Secondary | ICD-10-CM | POA: Diagnosis present

## 2018-06-02 DIAGNOSIS — N189 Chronic kidney disease, unspecified: Secondary | ICD-10-CM

## 2018-06-02 DIAGNOSIS — Z79891 Long term (current) use of opiate analgesic: Secondary | ICD-10-CM | POA: Diagnosis not present

## 2018-06-02 DIAGNOSIS — R7881 Bacteremia: Secondary | ICD-10-CM | POA: Diagnosis present

## 2018-06-02 DIAGNOSIS — I700 Atherosclerosis of aorta: Secondary | ICD-10-CM | POA: Diagnosis present

## 2018-06-02 DIAGNOSIS — R52 Pain, unspecified: Secondary | ICD-10-CM | POA: Diagnosis not present

## 2018-06-02 DIAGNOSIS — Z833 Family history of diabetes mellitus: Secondary | ICD-10-CM

## 2018-06-02 DIAGNOSIS — K5900 Constipation, unspecified: Secondary | ICD-10-CM | POA: Diagnosis present

## 2018-06-02 DIAGNOSIS — M4626 Osteomyelitis of vertebra, lumbar region: Secondary | ICD-10-CM | POA: Diagnosis present

## 2018-06-02 DIAGNOSIS — N182 Chronic kidney disease, stage 2 (mild): Secondary | ICD-10-CM | POA: Diagnosis not present

## 2018-06-02 DIAGNOSIS — I959 Hypotension, unspecified: Secondary | ICD-10-CM | POA: Diagnosis not present

## 2018-06-02 DIAGNOSIS — Z8261 Family history of arthritis: Secondary | ICD-10-CM

## 2018-06-02 DIAGNOSIS — Z1619 Resistance to other specified beta lactam antibiotics: Secondary | ICD-10-CM | POA: Diagnosis present

## 2018-06-02 DIAGNOSIS — Z885 Allergy status to narcotic agent status: Secondary | ICD-10-CM | POA: Diagnosis not present

## 2018-06-02 DIAGNOSIS — Z96651 Presence of right artificial knee joint: Secondary | ICD-10-CM | POA: Diagnosis present

## 2018-06-02 DIAGNOSIS — N1832 Chronic kidney disease, stage 3b: Secondary | ICD-10-CM | POA: Diagnosis present

## 2018-06-02 DIAGNOSIS — K869 Disease of pancreas, unspecified: Secondary | ICD-10-CM | POA: Diagnosis present

## 2018-06-02 DIAGNOSIS — Z79899 Other long term (current) drug therapy: Secondary | ICD-10-CM

## 2018-06-02 DIAGNOSIS — Y848 Other medical procedures as the cause of abnormal reaction of the patient, or of later complication, without mention of misadventure at the time of the procedure: Secondary | ICD-10-CM | POA: Diagnosis present

## 2018-06-02 DIAGNOSIS — Z886 Allergy status to analgesic agent status: Secondary | ICD-10-CM | POA: Diagnosis not present

## 2018-06-02 DIAGNOSIS — B9561 Methicillin susceptible Staphylococcus aureus infection as the cause of diseases classified elsewhere: Secondary | ICD-10-CM | POA: Diagnosis not present

## 2018-06-02 DIAGNOSIS — R161 Splenomegaly, not elsewhere classified: Secondary | ICD-10-CM | POA: Diagnosis present

## 2018-06-02 DIAGNOSIS — Z8249 Family history of ischemic heart disease and other diseases of the circulatory system: Secondary | ICD-10-CM

## 2018-06-02 DIAGNOSIS — Z9049 Acquired absence of other specified parts of digestive tract: Secondary | ICD-10-CM

## 2018-06-02 DIAGNOSIS — N200 Calculus of kidney: Secondary | ICD-10-CM | POA: Diagnosis present

## 2018-06-02 DIAGNOSIS — F329 Major depressive disorder, single episode, unspecified: Secondary | ICD-10-CM | POA: Diagnosis present

## 2018-06-02 DIAGNOSIS — Z90710 Acquired absence of both cervix and uterus: Secondary | ICD-10-CM

## 2018-06-02 DIAGNOSIS — E669 Obesity, unspecified: Secondary | ICD-10-CM | POA: Diagnosis not present

## 2018-06-02 DIAGNOSIS — Z6841 Body Mass Index (BMI) 40.0 and over, adult: Secondary | ICD-10-CM

## 2018-06-02 DIAGNOSIS — M797 Fibromyalgia: Secondary | ICD-10-CM | POA: Diagnosis present

## 2018-06-02 DIAGNOSIS — K862 Cyst of pancreas: Secondary | ICD-10-CM | POA: Diagnosis present

## 2018-06-02 DIAGNOSIS — R339 Retention of urine, unspecified: Secondary | ICD-10-CM | POA: Diagnosis present

## 2018-06-02 DIAGNOSIS — G2581 Restless legs syndrome: Secondary | ICD-10-CM | POA: Diagnosis present

## 2018-06-02 DIAGNOSIS — B958 Unspecified staphylococcus as the cause of diseases classified elsewhere: Secondary | ICD-10-CM | POA: Diagnosis not present

## 2018-06-02 DIAGNOSIS — M48061 Spinal stenosis, lumbar region without neurogenic claudication: Secondary | ICD-10-CM | POA: Diagnosis present

## 2018-06-02 DIAGNOSIS — D631 Anemia in chronic kidney disease: Secondary | ICD-10-CM | POA: Diagnosis present

## 2018-06-02 DIAGNOSIS — M4640 Discitis, unspecified, site unspecified: Secondary | ICD-10-CM

## 2018-06-02 DIAGNOSIS — I129 Hypertensive chronic kidney disease with stage 1 through stage 4 chronic kidney disease, or unspecified chronic kidney disease: Secondary | ICD-10-CM | POA: Diagnosis not present

## 2018-06-02 DIAGNOSIS — G061 Intraspinal abscess and granuloma: Secondary | ICD-10-CM | POA: Diagnosis present

## 2018-06-02 DIAGNOSIS — F419 Anxiety disorder, unspecified: Secondary | ICD-10-CM | POA: Diagnosis present

## 2018-06-02 DIAGNOSIS — B957 Other staphylococcus as the cause of diseases classified elsewhere: Secondary | ICD-10-CM | POA: Diagnosis present

## 2018-06-02 DIAGNOSIS — E1140 Type 2 diabetes mellitus with diabetic neuropathy, unspecified: Secondary | ICD-10-CM | POA: Diagnosis present

## 2018-06-02 DIAGNOSIS — Z882 Allergy status to sulfonamides status: Secondary | ICD-10-CM

## 2018-06-02 DIAGNOSIS — M4646 Discitis, unspecified, lumbar region: Secondary | ICD-10-CM | POA: Diagnosis present

## 2018-06-02 DIAGNOSIS — E1169 Type 2 diabetes mellitus with other specified complication: Secondary | ICD-10-CM | POA: Diagnosis present

## 2018-06-02 DIAGNOSIS — G062 Extradural and subdural abscess, unspecified: Secondary | ICD-10-CM | POA: Diagnosis present

## 2018-06-02 DIAGNOSIS — M545 Low back pain, unspecified: Secondary | ICD-10-CM

## 2018-06-02 DIAGNOSIS — Z79890 Hormone replacement therapy (postmenopausal): Secondary | ICD-10-CM

## 2018-06-02 DIAGNOSIS — Z888 Allergy status to other drugs, medicaments and biological substances status: Secondary | ICD-10-CM | POA: Diagnosis not present

## 2018-06-02 DIAGNOSIS — K8689 Other specified diseases of pancreas: Secondary | ICD-10-CM

## 2018-06-02 DIAGNOSIS — Z823 Family history of stroke: Secondary | ICD-10-CM

## 2018-06-02 DIAGNOSIS — R109 Unspecified abdominal pain: Secondary | ICD-10-CM | POA: Diagnosis not present

## 2018-06-02 DIAGNOSIS — Z96611 Presence of right artificial shoulder joint: Secondary | ICD-10-CM | POA: Diagnosis not present

## 2018-06-02 DIAGNOSIS — Z9989 Dependence on other enabling machines and devices: Secondary | ICD-10-CM

## 2018-06-02 DIAGNOSIS — I10 Essential (primary) hypertension: Secondary | ICD-10-CM

## 2018-06-02 DIAGNOSIS — Z452 Encounter for adjustment and management of vascular access device: Secondary | ICD-10-CM | POA: Diagnosis not present

## 2018-06-02 DIAGNOSIS — E0500 Thyrotoxicosis with diffuse goiter without thyrotoxic crisis or storm: Secondary | ICD-10-CM | POA: Diagnosis present

## 2018-06-02 DIAGNOSIS — R140 Abdominal distension (gaseous): Secondary | ICD-10-CM | POA: Diagnosis not present

## 2018-06-02 DIAGNOSIS — T85735A Infection and inflammatory reaction due to cranial or spinal infusion catheter, initial encounter: Principal | ICD-10-CM | POA: Diagnosis present

## 2018-06-02 DIAGNOSIS — Z8739 Personal history of other diseases of the musculoskeletal system and connective tissue: Secondary | ICD-10-CM | POA: Diagnosis not present

## 2018-06-02 LAB — COMPREHENSIVE METABOLIC PANEL
ALT: 13 U/L (ref 0–44)
AST: 15 U/L (ref 15–41)
Albumin: 3.1 g/dL — ABNORMAL LOW (ref 3.5–5.0)
Alkaline Phosphatase: 77 U/L (ref 38–126)
Anion gap: 10 (ref 5–15)
BUN: 26 mg/dL — ABNORMAL HIGH (ref 8–23)
CO2: 28 mmol/L (ref 22–32)
Calcium: 9.2 mg/dL (ref 8.9–10.3)
Chloride: 98 mmol/L (ref 98–111)
Creatinine, Ser: 1.08 mg/dL — ABNORMAL HIGH (ref 0.44–1.00)
GFR calc Af Amer: >60 mL/min (ref >60)
GFR calc non Af Amer: 53 mL/min — ABNORMAL LOW (ref >60)
Glucose, Bld: 139 mg/dL — ABNORMAL HIGH (ref 70–99)
Potassium: 4.2 mmol/L (ref 3.5–5.1)
Sodium: 136 mmol/L (ref 135–145)
Total Bilirubin: 0.5 mg/dL (ref 0.3–1.2)
Total Protein: 6.9 g/dL (ref 6.5–8.1)

## 2018-06-02 LAB — URINALYSIS, ROUTINE W REFLEX MICROSCOPIC
Bilirubin Urine: NEGATIVE
Glucose, UA: NEGATIVE mg/dL
Hgb urine dipstick: NEGATIVE
Ketones, ur: NEGATIVE mg/dL
Leukocytes,Ua: NEGATIVE
Nitrite: NEGATIVE
Protein, ur: NEGATIVE mg/dL
Specific Gravity, Urine: 1.01 (ref 1.005–1.030)
pH: 6 (ref 5.0–8.0)

## 2018-06-02 LAB — CBC WITH DIFFERENTIAL/PLATELET
Abs Immature Granulocytes: 0.02 10*3/uL (ref 0.00–0.07)
Basophils Absolute: 0 10*3/uL (ref 0.0–0.1)
Basophils Relative: 0 %
Eosinophils Absolute: 0 10*3/uL (ref 0.0–0.5)
Eosinophils Relative: 0 %
HCT: 28.1 % — ABNORMAL LOW (ref 36.0–46.0)
Hemoglobin: 8.2 g/dL — ABNORMAL LOW (ref 12.0–15.0)
Immature Granulocytes: 1 %
Lymphocytes Relative: 18 %
Lymphs Abs: 0.7 10*3/uL (ref 0.7–4.0)
MCH: 23.8 pg — ABNORMAL LOW (ref 26.0–34.0)
MCHC: 29.2 g/dL — ABNORMAL LOW (ref 30.0–36.0)
MCV: 81.7 fL (ref 80.0–100.0)
Monocytes Absolute: 0.5 10*3/uL (ref 0.1–1.0)
Monocytes Relative: 13 %
Neutro Abs: 2.6 10*3/uL (ref 1.7–7.7)
Neutrophils Relative %: 68 %
Platelets: 208 10*3/uL (ref 150–400)
RBC: 3.44 MIL/uL — ABNORMAL LOW (ref 3.87–5.11)
RDW: 15.6 % — ABNORMAL HIGH (ref 11.5–15.5)
WBC: 3.8 10*3/uL — ABNORMAL LOW (ref 4.0–10.5)
nRBC: 0 % (ref 0.0–0.2)

## 2018-06-02 LAB — LIPASE, BLOOD: Lipase: 22 U/L (ref 11–51)

## 2018-06-02 LAB — POC OCCULT BLOOD, ED: Fecal Occult Bld: NEGATIVE

## 2018-06-02 LAB — GLUCOSE, CAPILLARY: Glucose-Capillary: 200 mg/dL — ABNORMAL HIGH (ref 70–99)

## 2018-06-02 MED ORDER — ONDANSETRON HCL 4 MG/2ML IJ SOLN
4.0000 mg | Freq: Once | INTRAMUSCULAR | Status: AC
  Administered 2018-06-02: 4 mg via INTRAVENOUS
  Filled 2018-06-02: qty 2

## 2018-06-02 MED ORDER — CLONAZEPAM 0.5 MG PO TABS
0.5000 mg | ORAL_TABLET | Freq: Every evening | ORAL | Status: DC | PRN
  Administered 2018-06-03: 0.5 mg via ORAL
  Filled 2018-06-02: qty 1

## 2018-06-02 MED ORDER — ROPINIROLE HCL 1 MG PO TABS
3.0000 mg | ORAL_TABLET | Freq: Every day | ORAL | Status: DC
  Administered 2018-06-03 – 2018-06-06 (×5): 3 mg via ORAL
  Filled 2018-06-02 (×5): qty 3

## 2018-06-02 MED ORDER — ONDANSETRON HCL 4 MG PO TABS
4.0000 mg | ORAL_TABLET | Freq: Four times a day (QID) | ORAL | Status: DC | PRN
  Administered 2018-06-04: 4 mg via ORAL
  Filled 2018-06-02: qty 1

## 2018-06-02 MED ORDER — POTASSIUM CHLORIDE CRYS ER 20 MEQ PO TBCR
20.0000 meq | EXTENDED_RELEASE_TABLET | Freq: Every day | ORAL | Status: DC
  Administered 2018-06-03 – 2018-06-06 (×4): 20 meq via ORAL
  Filled 2018-06-02 (×4): qty 1

## 2018-06-02 MED ORDER — METOPROLOL TARTRATE 50 MG PO TABS
50.0000 mg | ORAL_TABLET | Freq: Three times a day (TID) | ORAL | Status: DC
  Administered 2018-06-03 – 2018-06-07 (×13): 50 mg via ORAL
  Filled 2018-06-02 (×14): qty 1

## 2018-06-02 MED ORDER — HYDROMORPHONE HCL 1 MG/ML IJ SOLN
0.5000 mg | Freq: Once | INTRAMUSCULAR | Status: AC
  Administered 2018-06-02: 0.5 mg via INTRAVENOUS
  Filled 2018-06-02: qty 1

## 2018-06-02 MED ORDER — AMLODIPINE BESYLATE 5 MG PO TABS
5.0000 mg | ORAL_TABLET | Freq: Every day | ORAL | Status: DC
  Administered 2018-06-03 – 2018-06-06 (×5): 5 mg via ORAL
  Filled 2018-06-02 (×5): qty 1

## 2018-06-02 MED ORDER — GABAPENTIN 100 MG PO CAPS
100.0000 mg | ORAL_CAPSULE | Freq: Three times a day (TID) | ORAL | Status: DC
  Administered 2018-06-03 – 2018-06-07 (×14): 100 mg via ORAL
  Filled 2018-06-02 (×15): qty 1

## 2018-06-02 MED ORDER — ACETAMINOPHEN 650 MG RE SUPP: 650.0000 mg | Freq: Four times a day (QID) | RECTAL | Status: DC | PRN

## 2018-06-02 MED ORDER — ACETAMINOPHEN 325 MG PO TABS: 650.0000 mg | ORAL_TABLET | Freq: Four times a day (QID) | ORAL | Status: DC | PRN

## 2018-06-02 MED ORDER — DICYCLOMINE HCL 10 MG/ML IM SOLN
20.0000 mg | Freq: Once | INTRAMUSCULAR | Status: AC
  Administered 2018-06-02: 20 mg via INTRAMUSCULAR
  Filled 2018-06-02: qty 2

## 2018-06-02 MED ORDER — DOXAZOSIN MESYLATE 2 MG PO TABS
2.0000 mg | ORAL_TABLET | Freq: Every day | ORAL | Status: DC
  Administered 2018-06-03 – 2018-06-06 (×5): 2 mg via ORAL
  Filled 2018-06-02 (×5): qty 1

## 2018-06-02 MED ORDER — INSULIN ASPART 100 UNIT/ML ~~LOC~~ SOLN
0.0000 [IU] | Freq: Three times a day (TID) | SUBCUTANEOUS | Status: DC
  Administered 2018-06-03: 3 [IU] via SUBCUTANEOUS

## 2018-06-02 MED ORDER — VITAMIN B-12 1000 MCG PO TABS
1000.0000 ug | ORAL_TABLET | Freq: Every day | ORAL | Status: DC
  Administered 2018-06-03 – 2018-06-07 (×5): 1000 ug via ORAL
  Filled 2018-06-02 (×5): qty 1

## 2018-06-02 MED ORDER — PANTOPRAZOLE SODIUM 40 MG PO TBEC
40.0000 mg | DELAYED_RELEASE_TABLET | Freq: Every day | ORAL | Status: DC
  Administered 2018-06-03 – 2018-06-07 (×5): 40 mg via ORAL
  Filled 2018-06-02 (×5): qty 1

## 2018-06-02 MED ORDER — FLUOXETINE HCL 20 MG PO CAPS
60.0000 mg | ORAL_CAPSULE | Freq: Every day | ORAL | Status: DC
  Administered 2018-06-03 – 2018-06-06 (×5): 60 mg via ORAL
  Filled 2018-06-02 (×5): qty 3

## 2018-06-02 MED ORDER — SODIUM CHLORIDE 0.9 % IV BOLUS
500.0000 mL | Freq: Once | INTRAVENOUS | Status: AC
  Administered 2018-06-02: 500 mL via INTRAVENOUS

## 2018-06-02 MED ORDER — MELATONIN 3 MG PO TABS: 3.0000 mg | ORAL_TABLET | Freq: Every evening | ORAL | Status: DC | PRN

## 2018-06-02 MED ORDER — DICYCLOMINE HCL 20 MG PO TABS: 20.0000 mg | ORAL_TABLET | Freq: Two times a day (BID) | ORAL | 0 refills | Status: DC

## 2018-06-02 MED ORDER — CYCLOBENZAPRINE HCL 10 MG PO TABS
10.0000 mg | ORAL_TABLET | Freq: Every day | ORAL | Status: DC
  Administered 2018-06-03 – 2018-06-06 (×5): 10 mg via ORAL
  Filled 2018-06-02 (×5): qty 1

## 2018-06-02 MED ORDER — ARIPIPRAZOLE 10 MG PO TABS
5.0000 mg | ORAL_TABLET | Freq: Every day | ORAL | Status: DC
  Administered 2018-06-03 – 2018-06-07 (×5): 5 mg via ORAL
  Filled 2018-06-02 (×5): qty 1

## 2018-06-02 MED ORDER — HYDROMORPHONE HCL 1 MG/ML IJ SOLN
1.0000 mg | INTRAMUSCULAR | Status: DC | PRN
  Administered 2018-06-03 – 2018-06-06 (×15): 1 mg via INTRAVENOUS
  Filled 2018-06-02 (×16): qty 1

## 2018-06-02 MED ORDER — MAGNESIUM OXIDE 400 (241.3 MG) MG PO TABS
400.0000 mg | ORAL_TABLET | Freq: Every day | ORAL | Status: DC
  Administered 2018-06-03 – 2018-06-06 (×4): 400 mg via ORAL
  Filled 2018-06-02 (×4): qty 1

## 2018-06-02 MED ORDER — ONDANSETRON HCL 4 MG/2ML IJ SOLN: 4.0000 mg | Freq: Four times a day (QID) | INTRAMUSCULAR | Status: DC | PRN

## 2018-06-02 MED ORDER — FENOFIBRATE 54 MG PO TABS
54.0000 mg | ORAL_TABLET | Freq: Every day | ORAL | Status: DC
  Administered 2018-06-03 – 2018-06-06 (×4): 54 mg via ORAL
  Filled 2018-06-02 (×4): qty 1

## 2018-06-02 MED ORDER — IOPAMIDOL (ISOVUE-300) INJECTION 61%
100.0000 mL | Freq: Once | INTRAVENOUS | Status: AC | PRN
  Administered 2018-06-02: 100 mL via INTRAVENOUS

## 2018-06-02 MED ORDER — POLYETHYLENE GLYCOL 3350 17 G PO PACK: 17.0000 g | PACK | Freq: Every day | ORAL | 0 refills | Status: DC

## 2018-06-02 MED ORDER — PROPYLTHIOURACIL 50 MG PO TABS
75.0000 mg | ORAL_TABLET | Freq: Every day | ORAL | Status: DC
  Administered 2018-06-03 – 2018-06-07 (×5): 75 mg via ORAL
  Filled 2018-06-02 (×5): qty 1.5

## 2018-06-02 MED ORDER — IOPAMIDOL (ISOVUE-300) INJECTION 61%
INTRAVENOUS | Status: AC
  Filled 2018-06-02: qty 100

## 2018-06-02 MED ORDER — INSULIN GLARGINE 100 UNIT/ML ~~LOC~~ SOLN
30.0000 [IU] | Freq: Two times a day (BID) | SUBCUTANEOUS | Status: DC
  Administered 2018-06-03 – 2018-06-07 (×10): 30 [IU] via SUBCUTANEOUS
  Filled 2018-06-02 (×10): qty 0.3

## 2018-06-02 NOTE — ED Notes (Signed)
Bed: WA20 Expected date:  Expected time:  Means of arrival:  Comments: EMS abdominal pain

## 2018-06-02 NOTE — Consult Note (Signed)
Reason for Consult: L1-2 discitis Referring Physician: EDP  [name] is an 68 y.o. female.   HPI:  68 year old obese female with multiple medical problems and a history of chronic back pain followed by Dr. Vertell Limber who had an epidural steroid injection at L3-4 on 05/23/2014.  2 days later her pain significantly changed.  The last week she has had increased back pain across the low back that radiates into the abdomen.  She has no leg pain is that improved after the epidural steroid injection.  She denies numbness or tingling or weakness.  She has had some dribbling of urine.  She had trouble getting out of bed this morning because of pain in her back and abdomen.  She has had a CT scan of her abdomen and an MRI of the lumbar spine.  She states blood cultures have been obtained.  Pain is Vere and progressive over the last week. Aching.  Past Medical History:  Diagnosis Date  . Anemia   . Arthritis   . Asthma    OCCAS  . Bell's palsy   . Broken neck ([place])    C-1  . Carbuncle and furuncle of trunk   . Chronic dislocation of right shoulder   . Depression   . Disorder of fascia    HX OF NECROTIC FASCITIS AFTER ABDOMINAL SURGERY FOR HERNIA- REQUIRED 19 SURGERIES AND 2.5 MONTH HOSPITALIZATION AT [facility]  . DM (diabetes mellitus) ([place])   . Dysrhythmia    HX OF TACHYCARDIA AND BRADYCARDIA - ON GOING FOR YEARS - DOES NOT HAVE TO SEE CARDIOLOGIST  . Elevated cholesterol   . Fibromyalgia   . Fracture MAY 2014   HX OF FRACTURED NECK C1- CAUSES SEVERE HEADACHES--LIMITED ROM NECK  . Frequent infections    ESPECIALLY PRONE TO INFECTIONS AFTER SURGERIES  . Graves disease   . Heart murmur    DOES NOT CAUSE ANY PROBLEMS  . History of kidney stones   . Hyperlipidemia   . Hypertension   . Hypothyroidism    GRAVES DISEASE  . Migraine   . 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 Neuropathy   . Obesity   . OSA (obstructive sleep apnea)    USES CPAP - DOES NOT KNOW SETTING   . Pain    LEFT SHOULDER  PAIN -HARD TO LIE ON LEFT SIDE FOR LONG PERIOD;  PAIN IN LOWER BADK - 3 HERNIATED DISCS AND STENOISIS -  . PONV (postoperative nausea and vomiting)    THE GAS MAKES ME NAUSEATED  . Restless leg syndrome   . Sciatica   . Tachycardia   . Urinary frequency   . Urticaria   . UTI (urinary tract infection)     Past Surgical History:  Procedure Laterality Date  . ABDOMINAL HYSTERECTOMY     LARGE TUMOR AT OVARY REMOVED  . APPENDECTOMY    . CARPAL TUNNEL RELEASE     BILATERAL  . CATARACT EXTRACTION W/ INTRAOCULAR LENS  IMPLANT, BILATERAL    . CESAREAN SECTION     X 3  . CHOLECYSTECTOMY    . CYSTOSCOPY WITH URETEROSCOPY Right 12/03/2013   Procedure: CYSTOSCOPY WITH RIGHT RETROGRADE URETEROSCOPY LASER LITHOTRIPSY RIGHT STONE RIGHT URETERAL STENT, ;  Surgeon: Raynelle Bring, MD;  Location: [facility];  Service: Urology;  Laterality: Right;  PROCEDURE WAS ORIGINALLY SCHEDULED AS RIGHT PERCUTANEOUS NEPHROLITHOTOMY  . EYELID LACERATION REPAIR     RIGHT EYE  . HERNIA REPAIR  ABDOMINAL HERNIA REPAIR WITH MESH - 3 SURGERIES   . HX OF 19 SURGERIES FOR NECROTIC FASCITIS     SKIN GRAFTS+ WOUND  VAC  . I&D OF INFECTED SITE IN [place] - FROM AN INJECTION    . JOINT REPLACEMENT     TOTAL RIGHT KNEE REPLACEMENT  . KNEE ARTHROSCOPY  RIGHT AND LEFT   X 2  . [place] RESECTION  2014  . NEPHROLITHOTOMY Right 12/10/2013   Procedure: NEPHROLITHOTOMY PERCUTANEOUS;  Surgeon: Raynelle Bring, MD;  Location: [facility];  Service: Urology;  Laterality: Right;  . REVERSE SHOULDER ARTHROPLASTY Right 07/21/2017  . REVERSE SHOULDER ARTHROPLASTY Right 07/21/2017   Procedure: RIGHT REVERSE SHOULDER ARTHROPLASTY;  Surgeon: Tania Ade, MD;  Location: [facility];  Service: Orthopedics;  Laterality: Right;  . RIGHT FOOT DRAINAGE OF INFECTION    . shoulder arthroscopy Right    X 2  . TONSILLECTOMY     AND ADENOIDECTOMY    Allergies  Allergen Reactions  . Imitrex [Sumatriptan] Other  (See Comments)    Vascular spasms  . Morphine Other (See Comments)    Severe headaches   . Nsaids Other (See Comments)    PT UNABLE TO TOLERATE NSAID'S DRUGS DUE TO HX OF GASTRIC SLEEVE SURGERY  . Codeine Nausea Only  . Promethazine Hcl Other (See Comments)    Restless leg feeling all over body  . Rosuvastatin Calcium Other (See Comments)    Leg/muscle pain  . Statins Other (See Comments)    Leg/muscle pain  . Sulfonamide Derivatives Hives    Childhood allergy   . Vicodin [Hydrocodone-Acetaminophen] Nausea And Vomiting    Social History   Tobacco Use  . Smoking status: Never Smoker  . Smokeless tobacco: Never Used  Substance Use Topics  . Alcohol use: No    Family History  Problem Relation Age of Onset  . Diabetes Father   . Osteoarthritis Father   . Heart disease Father   . Ulcers Father   . Stroke Mother   . Suicidality Sister      Review of Systems  Positive ROS: as above  All other systems have been reviewed and were otherwise negative with the exception of those mentioned in the HPI and as above.  Objective: Vital signs in last 24 hours: Temp:  [98.4 F (36.9 C)] 98.4 F (36.9 C) (03/13 0646) Pulse Rate:  [72-75] 75 (03/13 1412) Resp:  [15-16] 15 (03/13 1412) BP: (100-126)/(49-78) 100/78 (03/13 1412) SpO2:  [94 %-98 %] 95 % (03/13 1412) Weight:  [108.8 kg] 108.8 kg (03/13 1745)  General Appearance: Alert, cooperative, no distress, appears stated age, morbidly obese Head: Normocephalic, without obvious abnormality, atraumatic Eyes: PERRL, conjunctiva/corneas clear, EOM's intact Throat: benign Neck: Supple Lungs:  respirations unlabored Heart: Regular rate and rhythm Abdomen: Soft Extremities: Extremities normal, atraumatic, no cyanosis or edema   NEUROLOGIC:   Mental status: A&O x4, no aphasia, good attention span, Memory and fund of knowledge Motor Exam - grossly normal, normal tone and bulk Sensory Exam - grossly normal Reflexes: symmetric,  no pathologic reflexes, No Hoffman's, No clonus Coordination - grossly normal Gait -not tested Balance -not tested Cranial Nerves: I: smell Not tested  II: visual acuity  OS: na    OD: na  II: visual fields Full to confrontation  II: pupils Equal, round, reactive to light  III,VII: ptosis None  III,IV,VI: extraocular muscles  Full ROM  V: mastication Normal  V: facial light touch sensation  Normal  V,VII: corneal reflex  Present  VII: facial muscle function - upper  Normal  VII: facial muscle function - lower Normal  VIII: hearing Not tested  IX: soft palate elevation  Normal  IX,X: gag reflex Present  XI: trapezius strength  5/5  XI: sternocleidomastoid strength 5/5  XI: neck flexion strength  5/5  XII: tongue strength  Normal    Data Review Lab Results  Component Value Date   WBC 3.8 (L) 06/02/2018   HGB 8.2 (L) 06/02/2018   HCT 28.1 (L) 06/02/2018   MCV 81.7 06/02/2018   PLT 208 06/02/2018   Lab Results  Component Value Date   NA 136 06/02/2018   K 4.2 06/02/2018   CL 98 06/02/2018   CO2 28 06/02/2018   BUN 26 (H) 06/02/2018   CREATININE 1.08 (H) 06/02/2018   GLUCOSE 139 (H) 06/02/2018   Lab Results  Component Value Date   INR 1.05 07/13/2017    Radiology: Mr Lumbar Spine Wo Contrast  Addendum Date: 06/02/2018   ADDENDUM REPORT: 06/02/2018 18:13 ADDENDUM: Study discussed by telephone with Dr. Gareth Morgan on 06/02/2018 at 1759 hours. Electronically Signed   By: Genevie Ann M.D.   On: 06/02/2018 18:13   Result Date: 06/02/2018 CLINICAL DATA:  68 year old female with back pain. Possible cauda equina syndrome. EXAM: MRI LUMBAR SPINE WITHOUT CONTRAST TECHNIQUE: Multiplanar, multisequence MR imaging of the lumbar spine was performed. No intravenous contrast was administered. COMPARISON:  CT Abdomen and Pelvis earlier today. Lumbar MRI 12/25/2017. FINDINGS: Segmentation: Normal on the CT today which is the same numbering system used on the 2019 MRI. Alignment: Stable  since 2019. Mildly exaggerated lumbar lordosis. Mild grade 1 anterolisthesis of L5 on S1. Mild retrolisthesis at both L1-L2 and L2-L3. Vertebrae: New abnormal decreased T1 marrow signal about the L1-L2 endplates which demonstrate new abnormal increased signal in the disc space on T2 and STIR. See series 11, image 8 and series 9, image 8. Subtle endplate erosion to the right of midline on CT today. Abnormal heterogeneous T2 signal occupying space in the ventral epidural space tracking cephalad from the disc (series 9, image 9 and series 12, image 3). Furthermore, this ventral epidural space abnormality tracks cephalad into the lower thoracic spine best seen on series 9, image 8. Prevertebral fat stranding along the right anterior disc (series 13, image 6). And bilateral medial psoas muscle edema. Other lumbar vertebrae appear stable. No other marrow edema or evidence of acute osseous abnormality. Intact visible sacrum and SI joints. Conus medullaris and cauda equina: Conus extends to the L1 level. No lower spinal cord or conus signal abnormality despite the abnormal ventral epidural space from the lower thoracic spine to L1-L2. Paraspinal and other soft tissues: Abnormal paraspinal inflammation at L1-L2 detailed above. No intramuscular abscess. Stable visible abdominal viscera from the CT today. Disc levels: Abnormal ventral epidural space from T10-T11 through L1-L2 as stated above. At L1-L2 the constellation of abnormal disc and epidural findings results in increased and moderate spinal stenosis which is just below the level of the conus. Mild to moderate bilateral L1 foraminal stenosis has not significantly changed. L2-L3: Stable moderate to severe spinal, left lateral recess, and left L2 foraminal stenosis related to disc and posterior element degeneration. Other lumbar levels are stable since 2019. IMPRESSION: 1. Acute Discitis Osteomyelitis at L1-L2 with Ventral Epidural Abscess tracking cephalad from there to  the T10-T11 level. Right greater than left mostly ventral paraspinal phlegmon. No paraspinal abscess. 2. Moderate increased Spinal Stenosis related to #1 including at  the level of the Conus. No signal abnormality identified in the lower thoracic spinal cord or conus. 3. Elsewhere stable lumbar degenerative changes, including moderate to severe multifactorial spinal, left lateral recess and left foraminal stenosis at L2-L3. Electronically Signed: By: Genevie Ann M.D. On: 06/02/2018 17:55   Ct Abdomen Pelvis W Contrast  Result Date: 06/02/2018 CLINICAL DATA:  Abdominal pain, by patient report distinct from prior dated 05/31/2018. History of kidney stones, lithotomy, hernia repairs, cholecystectomy, appendectomy, hysterectomy EXAM: CT ABDOMEN AND PELVIS WITH CONTRAST TECHNIQUE: Multidetector CT imaging of the abdomen and pelvis was performed using the standard protocol following bolus administration of intravenous contrast. CONTRAST:  189mL ISOVUE-300 IOPAMIDOL (ISOVUE-300) INJECTION 61% COMPARISON:  CT abdomen pelvis, 05/31/2018 FINDINGS: Lower chest: No acute abnormality. Hepatobiliary: Redemonstrated hepatomegaly. No focal liver abnormality is seen. Status post cholecystectomy. Pancreas: Redemonstrated low-attenuation, lobulated appearing mass of the pancreatic head, measuring at least 2.7 cm (series 2, image 27). No obvious pancreatic ductal dilatation or surrounding inflammatory changes. Spleen: Redemonstrated splenomegaly, maximum span 13.5 cm. Adrenals/Urinary Tract: Adrenal glands are unremarkable. Small nonobstructive inferior pole calculus of the right kidney. No hydronephrosis. Bladder is unremarkable. Stomach/Bowel: Status post partial gastrectomy. Appendix is surgically absent. No evidence of bowel wall thickening, distention, or inflammatory changes. Large burden of stool in the colon, similar to prior examination. Vascular/Lymphatic: No significant vascular findings are present. No enlarged abdominal or  pelvic lymph nodes. Reproductive: No mass or other abnormality. Status post hysterectomy. Other: Large ventral defect of the abdominal wall soft tissues, in keeping with history of ventral hernia repairs. No abdominopelvic ascites. Musculoskeletal: No acute or significant osseous findings. IMPRESSION: 1. No definite acute CT findings of the abdomen or pelvis to explain pain. 2. Unchanged lobulated, lobulated appearing mass of the pancreatic head, measuring at least 2.7 cm. As on prior examination this likely reflects a mucinous neoplasm, although pancreatic adenocarcinoma is not excluded. Recommend further evaluation with multiphasic contrast-enhanced MRI/MRCP or pancreatic protocol CT. This recommendation follows ACR consensus guidelines: Management of Incidental Pancreatic Cysts: A White Paper of the ACR Incidental Findings Committee. J Am Coll Radiol 4315;[id number]. 3. Large stool burden throughout the colon, similar to prior study. 4. Other chronic, incidental, and postoperative findings as detailed above. Electronically Signed   By: Eddie Candle M.D.   On: 06/02/2018 08:56     Assessment/Plan: Estimated body mass index is 45.32 kg/m as calculated from the following:   Height as of this encounter: 5\' 1"  (1.549 m).   Weight as of this encounter: 108.8 kg.   Obese female who had an epidural steroid injection at L3-4 on 05/23/2018 has a history of chronic back pain and spinal stenosis at L1-2 and L2-3, who now has discitis at L1-2 with a thin ventral epidural abscess from L1-2 up to T10.  Do not believe this significantly increases the amount of stenosis that she has, and I do not believe surgery is necessary at this time.  She has excellent strength and no radicular symptoms.  She is not myelopathic.  Recommend obtaining microbiologic specimen either with blood cultures or with L1-2 disc space aspiration via interventional radiology, and recommend culture directed IV antibiotics for 6 weeks followed by  6 weeks of oral antibiotics.  Recommend admission to the medicine service for work-up and treatment.   We will inform Dr. Vertell Limber of the patient's presence in the hospital and her current condition   Eustace Moore 06/02/2018 7:54 PM

## 2018-06-02 NOTE — ED Notes (Signed)
Patient transported to CT 

## 2018-06-02 NOTE — H&P (Addendum)
History and Physical    [name] NUU:[id number] DOB: 07-14-1950 DOA: 06/02/2018  PCP: Rodrigo Ran, MD  Patient coming from: Home.  Chief Complaint: Back pain.  HPI: [name] is a 68 y.o. female with history of diabetes mellitus type 2, sleep apnea, hypertension, Graves' disease, chronic and disease, anemia was been experiencing low back pain since May 25, 2018 almost for last 1 week which has been increasing in intensity to the point patient is finding it difficult to walk.  Did not lose function of her extremities or did not have any incontinence of urine or bowel.  Patient had a epidural injection on May 23, 2018.  2 days later patient symptoms started coming.  Also had abdominal pain with some nausea.  Had come to the ER 3 days ago had CAT scan done which showed pancreatic mass and was advised to follow-up as outpatient.  Since pain worsened with difficulty walking now patient came back to the ER again.  Patient states he gets subjective feeling of fever and chills over the last couple of days.  ED Course: In the ER patient had MRI of the L-spine which shows discitis involving the L1-L2 with epidural abscess ventral tracking cephalad from T10-T11.  On-call neurosurgeon was consulted.  At this time they will be seeing patient in consult and requested interventional radiology consult for aspiration to get cultures before starting antibiotics.  Patient does not look septic at this time.  Review of Systems: As per HPI, rest all negative.   Past Medical History:  Diagnosis Date  . Anemia   . Arthritis   . Asthma    OCCAS  . Bell's palsy   . Broken neck (HCC)    C-1  . Carbuncle and furuncle of trunk   . Chronic dislocation of right shoulder   . Depression   . Disorder of fascia    HX OF NECROTIC FASCITIS AFTER ABDOMINAL SURGERY FOR HERNIA- REQUIRED 19 SURGERIES AND 2.5 MONTH HOSPITALIZATION AT [facility]  . DM (diabetes mellitus) (HCC)   . Dysrhythmia    HX OF TACHYCARDIA  AND BRADYCARDIA - ON GOING FOR YEARS - DOES NOT HAVE TO SEE CARDIOLOGIST  . Elevated cholesterol   . Fibromyalgia   . Fracture MAY 2014   HX OF FRACTURED NECK C1- CAUSES SEVERE HEADACHES--LIMITED ROM NECK  . Frequent infections    ESPECIALLY PRONE TO INFECTIONS AFTER SURGERIES  . Graves disease   . Heart murmur    DOES NOT CAUSE ANY PROBLEMS  . History of kidney stones   . Hyperlipidemia   . Hypertension   . Hypothyroidism    GRAVES DISEASE  . Migraine   . 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 Neuropathy   . Obesity   . OSA (obstructive sleep apnea)    USES CPAP - DOES NOT KNOW SETTING  . Pain    LEFT SHOULDER  PAIN -HARD TO LIE ON LEFT SIDE FOR LONG PERIOD;  PAIN IN LOWER BADK - 3 HERNIATED DISCS AND STENOISIS -  . PONV (postoperative nausea and vomiting)    THE GAS MAKES ME NAUSEATED  . Restless leg syndrome   . Sciatica   . Tachycardia   . Urinary frequency   . Urticaria   . UTI (urinary tract infection)     Past Surgical History:  Procedure Laterality Date  . ABDOMINAL HYSTERECTOMY     LARGE TUMOR AT OVARY REMOVED  . APPENDECTOMY    .  CARPAL TUNNEL RELEASE     BILATERAL  . CATARACT EXTRACTION W/ INTRAOCULAR LENS  IMPLANT, BILATERAL    . CESAREAN SECTION     X 3  . CHOLECYSTECTOMY    . CYSTOSCOPY WITH URETEROSCOPY Right 12/03/2013   Procedure: CYSTOSCOPY WITH RIGHT RETROGRADE URETEROSCOPY LASER LITHOTRIPSY RIGHT STONE RIGHT URETERAL STENT, ;  Surgeon: Heloise Purpura, MD;  Location: [facility];  Service: Urology;  Laterality: Right;  PROCEDURE WAS ORIGINALLY SCHEDULED AS RIGHT PERCUTANEOUS NEPHROLITHOTOMY  . EYELID LACERATION REPAIR     RIGHT EYE  . HERNIA REPAIR     ABDOMINAL HERNIA REPAIR WITH MESH - 3 SURGERIES   . HX OF 19 SURGERIES FOR NECROTIC FASCITIS     SKIN GRAFTS+ WOUND  VAC  . I&D OF INFECTED SITE IN [facility] - FROM AN INJECTION    . JOINT REPLACEMENT     TOTAL RIGHT KNEE REPLACEMENT  . KNEE ARTHROSCOPY  RIGHT AND LEFT   X 2  .  LAPAROSCOPIC GASTRIC SLEEVE RESECTION  2014  . NEPHROLITHOTOMY Right 12/10/2013   Procedure: NEPHROLITHOTOMY PERCUTANEOUS;  Surgeon: Heloise Purpura, MD;  Location: [facility];  Service: Urology;  Laterality: Right;  . REVERSE SHOULDER ARTHROPLASTY Right 07/21/2017  . REVERSE SHOULDER ARTHROPLASTY Right 07/21/2017   Procedure: RIGHT REVERSE SHOULDER ARTHROPLASTY;  Surgeon: Jones Broom, MD;  Location: [facility] OR;  Service: Orthopedics;  Laterality: Right;  . RIGHT FOOT DRAINAGE OF INFECTION    . shoulder arthroscopy Right    X 2  . TONSILLECTOMY     AND ADENOIDECTOMY     reports that she has never smoked. She has never used smokeless tobacco. She reports that she does not drink alcohol or use drugs.  Allergies  Allergen Reactions  . Imitrex [Sumatriptan] Other (See Comments)    Vascular spasms  . Morphine Other (See Comments)    Severe headaches   . Nsaids Other (See Comments)    PT UNABLE TO TOLERATE NSAID'S DRUGS DUE TO HX OF GASTRIC SLEEVE SURGERY  . Codeine Nausea Only  . Promethazine Hcl Other (See Comments)    Restless leg feeling all over body  . Rosuvastatin Calcium Other (See Comments)    Leg/muscle pain  . Statins Other (See Comments)    Leg/muscle pain  . Sulfonamide Derivatives Hives    Childhood allergy   . Vicodin [Hydrocodone-Acetaminophen] Nausea And Vomiting    Family History  Problem Relation Age of Onset  . Diabetes Father   . Osteoarthritis Father   . Heart disease Father   . Ulcers Father   . Stroke Mother   . Suicidality Sister     Prior to Admission medications   Medication Sig Start Date End Date Taking? Authorizing Provider  amLODipine (NORVASC) 5 MG tablet Take 5 mg by mouth at bedtime.  12/24/16  Yes [provider]  ARIPiprazole (ABILIFY) 5 MG tablet Take 1 tablet (5 mg total) by mouth daily. 12/24/17  Yes Hisada, Barbee Cough, MD  Cholecalciferol (VITAMIN D3 PO) Take 1,000 Units by mouth daily.   Yes [provider]  clonazePAM  (KLONOPIN) 0.5 MG tablet Take 0.5 mg by mouth at bedtime as needed for anxiety. Up to 3 times daily as needed for anxiety, take 1 tablet (0.5 mg) scheduled each night at bedtime. 07/01/14  Yes [provider]  Cyanocobalamin (VITAMIN B 12 PO) Take 1,000 mcg by mouth daily.   Yes [provider]  cyclobenzaprine (FLEXERIL) 10 MG tablet Take 10 mg by mouth See admin instructions. Take 1 tablet (  10 mg) every night at bedtime, may take an additional tablet during the day as needed for muscle spasms 07/15/14  Yes [provider]  doxazosin (CARDURA) 2 MG tablet Take 2 mg by mouth at bedtime.  12/23/16  Yes [provider]  fenofibrate 54 MG tablet Take 54 mg by mouth daily with supper. 06/20/17  Yes [provider]  FLUoxetine (PROZAC) 20 MG capsule Take 60 mg by mouth at bedtime.  06/28/17  Yes [provider]  furosemide (LASIX) 20 MG tablet Take 20-40 mg by mouth See admin instructions. Take 2 tablets (40 mg) in the morning & 1 tablet (20 mg) with lunch.   Yes [provider]  gabapentin (NEURONTIN) 100 MG capsule Take 100 mg by mouth See admin instructions. Take up to 3 times daily as needed for neuropathy--takes scheduled in the morning,evening & at night.   Yes [provider]  HYDROcodone-acetaminophen (NORCO/VICODIN) 5-325 MG tablet Take 1-2 tablets by mouth every 6 (six) hours as needed for severe pain.  12/19/17  Yes [provider]  LANTUS 100 UNIT/ML injection Inject 35 Units into the skin 2 (two) times daily. Based on sliding scale  06/28/17  Yes [provider]  MAGNESIUM PO Take 1 tablet by mouth daily with lunch.   Yes [provider]  Melatonin 10 MG TABS Take 20 mg by mouth at bedtime as needed (for sleep.).   Yes [provider]  metoprolol (LOPRESSOR) 50 MG tablet Take 50 mg by mouth 3 (three) times daily.  06/28/14  Yes [provider]  Multiple Vitamins-Minerals  (HAIR/SKIN/NAILS/BIOTIN) TABS Take 1 tablet by mouth daily with lunch.    Yes [provider]  nitrofurantoin, macrocrystal-monohydrate, (MACROBID) 100 MG capsule Take 100 mg by mouth 2 (two) times daily.  05/29/18  Yes [provider]  NOVOLOG 100 UNIT/ML injection Inject 10 Units into the skin 2 (two) times daily.  12/01/17  Yes [provider]  omeprazole (PRILOSEC) 20 MG capsule Take 20 mg by mouth daily with supper.    Yes [provider]  ondansetron (ZOFRAN) 4 MG tablet Take 4 mg by mouth 2 (two) times daily as needed. For nausea/vomiting prevention with pain medication. 06/02/17  Yes [provider]  potassium chloride SA (K-DUR,KLOR-CON) 20 MEQ tablet Take 20 mEq by mouth daily with lunch. 07/02/17  Yes [provider]  PREVIDENT 5000 DRY MOUTH 1.1 % GEL dental gel Take 1 application by mouth daily. 02/08/18  Yes [provider]  propylthiouracil (PTU) 50 MG tablet Take 75 mg by mouth daily.  12/22/16  Yes [provider]  rOPINIRole (REQUIP) 2 MG tablet Take 3 mg by mouth at bedtime.    Yes [provider]  traMADol (ULTRAM) 50 MG tablet Take 100 mg by mouth 3 (three) times daily as needed for pain. 05/23/18  Yes [provider]  dicyclomine (BENTYL) 20 MG tablet Take 1 tablet (20 mg total) by mouth 2 (two) times daily. 06/02/18   Carlyle Basques P, PA-C  polyethylene glycol [facility]) packet Take 17 g by mouth daily. 06/02/18   Leretha Dykes, PA-C    Physical Exam: Vitals:   06/02/18 1412 06/02/18 1745 06/02/18 2319 06/02/18 2350  BP: 100/78  (!) 121/57   Pulse: 75  93 87  Resp: 15  (!) 22   Temp:   99.3 F (37.4 C)   TempSrc:   Oral   SpO2: 95%  92% 96%  Weight:  108.8 kg  Height:  5\' 1"  (1.549 m)        Constitutional: Moderately built and nourished. Vitals:   06/02/18 1412 06/02/18 1745 06/02/18 2319 06/02/18 2350  BP: 100/78  (!) 121/57   Pulse: 75  93 87  Resp: 15  (!) 22   Temp:    99.3 F (37.4 C)   TempSrc:   Oral   SpO2: 95%  92% 96%  Weight:  108.8 kg    Height:  5\' 1"  (1.549 m)     Eyes: Anicteric no pallor. ENMT: No discharge from the ears eyes nose and mouth. Neck: No mass felt.  No neck rigidity no JVD appreciated. Respiratory: No rhonchi or crepitations. Cardiovascular: S1-S2 heard. Abdomen: Soft nontender bowel sounds present. Musculoskeletal: No edema.  No joint effusion. Skin: No rash. Neurologic: Alert awake oriented to time place and person.  Moves all extremities. Psychiatric: Appears normal.  Normal affect.   Labs on Admission: I have personally reviewed following labs and imaging studies  CBC: Recent Labs  Lab 05/31/18 1117 06/02/18 0704  WBC 3.8* 3.8*  NEUTROABS  --  2.6  HGB 9.5* 8.2*  HCT 31.7* 28.1*  MCV 79.8* 81.7  PLT 196 208   Basic Metabolic Panel: Recent Labs  Lab 05/31/18 1117 06/02/18 0704  NA 136 136  K 4.2 4.2  CL 99 98  CO2 27 28  GLUCOSE 129* 139*  BUN 36* 26*  CREATININE 1.16* 1.08*  CALCIUM 9.3 9.2   GFR: Estimated Creatinine Clearance: 58.4 mL/min (A) (by C-G formula based on SCr of 1.08 mg/dL (H)). Liver Function Tests: Recent Labs  Lab 05/31/18 1117 06/02/18 0704  AST 17 15  ALT 14 13  ALKPHOS 78 77  BILITOT 0.5 0.5  PROT 7.7 6.9  ALBUMIN 3.7 3.1*   Recent Labs  Lab 05/31/18 1117 06/02/18 0704  LIPASE 23 22   No results for input(s): AMMONIA in the last 168 hours. Coagulation Profile: No results for input(s): INR, PROTIME in the last 168 hours. Cardiac Enzymes: No results for input(s): CKTOTAL, CKMB, CKMBINDEX, TROPONINI in the last 168 hours. BNP (last 3 results) No results for input(s): PROBNP in the last 8760 hours. HbA1C: No results for input(s): HGBA1C in the last 72 hours. CBG: Recent Labs  Lab 06/02/18 2322  GLUCAP 200*   Lipid Profile: No results for input(s): CHOL, HDL, LDLCALC, TRIG, CHOLHDL, LDLDIRECT in the last 72 hours. Thyroid Function Tests: No results for  input(s): TSH, T4TOTAL, FREET4, T3FREE, THYROIDAB in the last 72 hours. Anemia Panel: No results for input(s): VITAMINB12, FOLATE, FERRITIN, TIBC, IRON, RETICCTPCT in the last 72 hours. Urine analysis:    Component Value Date/Time   COLORURINE YELLOW 06/02/2018 0819   APPEARANCEUR CLEAR 06/02/2018 0819   LABSPEC 1.010 06/02/2018 0819   PHURINE 6.0 06/02/2018 0819   GLUCOSEU NEGATIVE 06/02/2018 0819   HGBUR NEGATIVE 06/02/2018 0819   BILIRUBINUR NEGATIVE 06/02/2018 0819   KETONESUR NEGATIVE 06/02/2018 0819   PROTEINUR NEGATIVE 06/02/2018 0819   UROBILINOGEN 0.2 07/20/2014 1707   NITRITE NEGATIVE 06/02/2018 0819   LEUKOCYTESUR NEGATIVE 06/02/2018 0819   Sepsis Labs: @LABRCNTIP (procalcitonin:4,lacticidven:4) ) Recent Results (from the past 240 hour(s))  Urine culture     Status: None   Collection Time: 05/31/18 10:40 AM  Result Value Ref Range Status   Specimen Description   Final    URINE, CLEAN CATCH Performed at [facility], [address], [place], [place] [address]    Special Requests   Final  NONE Performed at [facility], [address] [place], [place] [address]    Culture   Final    NO GROWTH Performed at [facility], [address], [place], [place] [address]    Report Status 06/01/2018 FINAL  Final     Radiological Exams on Admission: Mr Lumbar Spine Wo Contrast  Addendum Date: 06/02/2018   ADDENDUM REPORT: 06/02/2018 18:13 ADDENDUM: Study discussed by telephone with Dr. Alvira Monday on 06/02/2018 at 1759 hours. Electronically Signed   By: Odessa Fleming M.D.   On: 06/02/2018 18:13   Result Date: 06/02/2018 CLINICAL DATA:  68 year old female with back pain. Possible cauda equina syndrome. EXAM: MRI LUMBAR SPINE WITHOUT CONTRAST TECHNIQUE: Multiplanar, multisequence MR imaging of the lumbar spine was performed. No intravenous contrast was administered. COMPARISON:  CT Abdomen and Pelvis earlier today. Lumbar MRI  12/25/2017. FINDINGS: Segmentation: Normal on the CT today which is the same numbering system used on the 2019 MRI. Alignment: Stable since 2019. Mildly exaggerated lumbar lordosis. Mild grade 1 anterolisthesis of L5 on S1. Mild retrolisthesis at both L1-L2 and L2-L3. Vertebrae: New abnormal decreased T1 marrow signal about the L1-L2 endplates which demonstrate new abnormal increased signal in the disc space on T2 and STIR. See series 11, image 8 and series 9, image 8. Subtle endplate erosion to the right of midline on CT today. Abnormal heterogeneous T2 signal occupying space in the ventral epidural space tracking cephalad from the disc (series 9, image 9 and series 12, image 3). Furthermore, this ventral epidural space abnormality tracks cephalad into the lower thoracic spine best seen on series 9, image 8. Prevertebral fat stranding along the right anterior disc (series 13, image 6). And bilateral medial psoas muscle edema. Other lumbar vertebrae appear stable. No other marrow edema or evidence of acute osseous abnormality. Intact visible sacrum and SI joints. Conus medullaris and cauda equina: Conus extends to the L1 level. No lower spinal cord or conus signal abnormality despite the abnormal ventral epidural space from the lower thoracic spine to L1-L2. Paraspinal and other soft tissues: Abnormal paraspinal inflammation at L1-L2 detailed above. No intramuscular abscess. Stable visible abdominal viscera from the CT today. Disc levels: Abnormal ventral epidural space from T10-T11 through L1-L2 as stated above. At L1-L2 the constellation of abnormal disc and epidural findings results in increased and moderate spinal stenosis which is just below the level of the conus. Mild to moderate bilateral L1 foraminal stenosis has not significantly changed. L2-L3: Stable moderate to severe spinal, left lateral recess, and left L2 foraminal stenosis related to disc and posterior element degeneration. Other lumbar levels are  stable since 2019. IMPRESSION: 1. Acute Discitis Osteomyelitis at L1-L2 with Ventral Epidural Abscess tracking cephalad from there to the T10-T11 level. Right greater than left mostly ventral paraspinal phlegmon. No paraspinal abscess. 2. Moderate increased Spinal Stenosis related to #1 including at the level of the Conus. No signal abnormality identified in the lower thoracic spinal cord or conus. 3. Elsewhere stable lumbar degenerative changes, including moderate to severe multifactorial spinal, left lateral recess and left foraminal stenosis at L2-L3. Electronically Signed: By: Odessa Fleming M.D. On: 06/02/2018 17:55   Ct Abdomen Pelvis W Contrast  Result Date: 06/02/2018 CLINICAL DATA:  Abdominal pain, by patient report distinct from prior dated 05/31/2018. History of kidney stones, lithotomy, hernia repairs, cholecystectomy, appendectomy, hysterectomy EXAM: CT ABDOMEN AND PELVIS WITH CONTRAST TECHNIQUE: Multidetector CT imaging of the abdomen and pelvis was performed using the standard protocol following  bolus administration of intravenous contrast. CONTRAST:  ISOVUE-300 IOPAMIDOL (ISOVUE-300) INJECTION 61% COMPARISON:  CT abdomen pelvis, 05/31/2018 FINDINGS: Lower chest: No acute abnormality. Hepatobiliary: Redemonstrated hepatomegaly. No focal liver abnormality is seen. Status post cholecystectomy. Pancreas: Redemonstrated low-attenuation, lobulated appearing mass of the pancreatic head, measuring at least 2.7 cm (series 2, image 27). No obvious pancreatic ductal dilatation or surrounding inflammatory changes. Spleen: Redemonstrated splenomegaly, maximum span 13.5 cm. Adrenals/Urinary Tract: Adrenal glands are unremarkable. Small nonobstructive inferior pole calculus of the right kidney. No hydronephrosis. Bladder is unremarkable. Stomach/Bowel: Status post partial gastrectomy. Appendix is surgically absent. No evidence of bowel wall thickening, distention, or inflammatory changes. Large burden of stool  in the colon, similar to prior examination. Vascular/Lymphatic: No significant vascular findings are present. No enlarged abdominal or pelvic lymph nodes. Reproductive: No mass or other abnormality. Status post hysterectomy. Other: Large ventral defect of the abdominal wall soft tissues, in keeping with history of ventral hernia repairs. No abdominopelvic ascites. Musculoskeletal: No acute or significant osseous findings. IMPRESSION: 1. No definite acute CT findings of the abdomen or pelvis to explain pain. 2. Unchanged lobulated, lobulated appearing mass of the pancreatic head, measuring at least 2.7 cm. As on prior examination this likely reflects a mucinous neoplasm, although pancreatic adenocarcinoma is not excluded. Recommend further evaluation with multiphasic contrast-enhanced MRI/MRCP or pancreatic protocol CT. This recommendation follows ACR consensus guidelines: Management of Incidental Pancreatic Cysts: A White Paper of the ACR Incidental Findings Committee. J Am Coll Radiol 2017;14:911-923. 3. Large stool burden throughout the colon, similar to prior study. 4. Other chronic, incidental, and postoperative findings as detailed above. Electronically Signed   By: Lauralyn Primes M.D.   On: 06/02/2018 08:56      Assessment/Plan Principal Problem:   Lumbar discitis Active Problems:   OSA on CPAP   Diabetes mellitus type 2 in obese (HCC)   Graves disease   Essential hypertension   Chronic kidney disease, stage II (mild)   Anemia due to chronic kidney disease    1. Lumbar discitis with epidural abscess with paraspinal phlegmon involving the L1-L2 -on-call neurosurgeon has been consulted.  Blood cultures have been sent.  Will consult interventional radiology for aspiration to get cultures.  Presently not on antibiotics.  Pain relief medications.  Will need infectious disease consult in the morning. 2. Pancreatic mass will need further work-up. 3. Diabetes mellitus type 2 on Lantus insulin 35 units  subcutaneous twice daily.  Patient states her blood sugars been running high recently.  Closely follow CBGs with sliding scale coverage.  I have placed patient on moderate dose sliding scale. 4. History of Graves' disease on propylthiouracil.  Follow CBC closely. 5. Chronic kidney disease stage II creatinine appears to be at baseline. 6. Anemia appears to be chronic likely from renal disease follow CBC. 7. Sleep apnea on CPAP. 8. Hypertension on metoprolol amlodipine and Cardura. 9. Restless leg syndrome on Requip. 10. Depression continue fluoxetine. 11. Anxiety on clonazepam.   DVT prophylaxis: SCDs in anticipation of procedure. Code Status: Full code. Family Communication: Patient husband. Disposition Plan: To be determined. Consults called: Neurosurgery. Admission status: Inpatient.   Eduard Clos MD [facility] Pager [phone].  If 7PM-7AM, please contact night-coverage [email or web address] Password [facility]  06/02/2018, 11:52 PM

## 2018-06-02 NOTE — ED Notes (Signed)
Pt arrives via [facility], A&O x 4.

## 2018-06-02 NOTE — Progress Notes (Signed)
Attempted patient for MRI L spine. Due to body habitus, patient did not clear bore of scanner. Spoke with RN and Solmon Ice at [facility] and gave information to both parties if patient needed to be scanned on wide bore scanner.

## 2018-06-02 NOTE — Progress Notes (Signed)
Arrived to 3w01 from Ed. Alert and oriented. Pain 10/10 d/t movement and reposition. Call light within reach.

## 2018-06-02 NOTE — ED Notes (Signed)
1st attempt to call report, states has not been approved to room yet.  Gave number to call back once ready.

## 2018-06-02 NOTE — ED Provider Notes (Signed)
  Physical Exam  BP 100/78   Pulse 75   Temp 98.4 F (36.9 C) (Oral)   Resp 15   Ht 5\' 1"  (1.549 m)   Wt 108.8 kg   SpO2 95%   BMI 45.32 kg/m   Physical Exam   5/5 strength, 4/5 plantar flexion on my exam Normal sensation   ED Course/Procedures   Clinical Course as of Jun 01 1757  Fri Jun 02, 2018  0901 No definite acute CT findings of the abdomen/pelvis. Pancreatic mass noted again. Will recommend evaluation with multiphasic contrast-enhanced MRI/MRCP or pancreatic protocol CT. Large stool burden, but no signs of obstruction.     CT Abdomen Pelvis W Contrast [AH]  0929 UA is unremarkable.  Urinalysis, Routine w reflex microscopic [AH]  1010 Decrease in hemoglobin from 9.5 to 8.2. Occult blood test is negative. Suspect this may be due to mild dehydration. Will provide IVF.  Hemoglobin(!): 8.2 [AH]    Clinical Course User Index [AH] Arville Lime, PA-C    Procedures  MDM   [phone] female presenting with abdominal and back pain, transferred to [facility]  MR shows concern for epidural abscess/discitis/osteomyelitis L1-L2 with abscess tracking to T10-T11 level, which appears new since prior MRI October 2019.  This appears to be causing increased spinal stenosis.   Spoke with Dr. Ronnald Ramp of [facility]. They will follow along and recommend culture data.  Will plan on admission with possible IR aspiration. Does not need empiric abx at this time, no signs of sepsis.     Gareth Morgan, MD 06/03/18 [phone]

## 2018-06-02 NOTE — ED Notes (Signed)
Writer called [facility], Megan in MRI and [facility] charge RN to arrange transportation for MRI.
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
## 2018-06-02 NOTE — ED Notes (Signed)
ED TO INPATIENT HANDOFF REPORT  ED Nurse Name and Phone #: Vilinda Blanks rn [phone]  S Name/Age/Gender [name] 68 y.o. female Room/Bed: H015C/H015C  Code Status   Code Status: Prior  Home/SNF/Other Home Patient oriented to: self, place, time and situation Is this baseline? Yes   Triage Complete: Triage complete  Chief Complaint abd pain  Triage Note No notes on file   Allergies Allergies  Allergen Reactions  . Imitrex [Sumatriptan] Other (See Comments)    Vascular spasms  . Morphine Other (See Comments)    Severe headaches   . Nsaids Other (See Comments)    PT UNABLE TO TOLERATE NSAID'S DRUGS DUE TO HX OF GASTRIC SLEEVE SURGERY  . Codeine Nausea Only  . Promethazine Hcl Other (See Comments)    Restless leg feeling all over body  . Rosuvastatin Calcium Other (See Comments)    Leg/muscle pain  . Statins Other (See Comments)    Leg/muscle pain  . Sulfonamide Derivatives Hives    Childhood allergy   . Vicodin [Hydrocodone-Acetaminophen] Nausea And Vomiting    Level of Care/Admitting Diagnosis ED Disposition    ED Disposition Condition [facility] Area: [place] [[id number]]  Level of Care: Med-Surg [16]  Diagnosis: Lumbar discitis [242353]  Admitting Physician: Rise Patience [[id number]]  Attending Physician: Rise Patience [phone]  Estimated length of stay: past midnight tomorrow  Certification:: I certify this patient will need inpatient services for at least 2 midnights  PT Class (Do Not Modify): Inpatient [101]  PT Acc Code (Do Not Modify): Private [1]       B Medical/Surgery History Past Medical History:  Diagnosis Date  . Anemia   . Arthritis   . Asthma    OCCAS  . Bell's palsy   . Broken neck ([place])    C-1  . Carbuncle and furuncle of trunk   . Chronic dislocation of right shoulder   . Depression   . Disorder of fascia    HX OF NECROTIC FASCITIS AFTER ABDOMINAL SURGERY FOR HERNIA- REQUIRED 19  SURGERIES AND 2.5 MONTH HOSPITALIZATION AT [facility]  . DM (diabetes mellitus) ([place])   . Dysrhythmia    HX OF TACHYCARDIA AND BRADYCARDIA - ON GOING FOR YEARS - DOES NOT HAVE TO SEE CARDIOLOGIST  . Elevated cholesterol   . Fibromyalgia   . Fracture MAY 2014   HX OF FRACTURED NECK C1- CAUSES SEVERE HEADACHES--LIMITED ROM NECK  . Frequent infections    ESPECIALLY PRONE TO INFECTIONS AFTER SURGERIES  . Graves disease   . Heart murmur    DOES NOT CAUSE ANY PROBLEMS  . History of kidney stones   . Hyperlipidemia   . Hypertension   . Hypothyroidism    GRAVES DISEASE  . Migraine   . 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 Neuropathy   . Obesity   . OSA (obstructive sleep apnea)    USES CPAP - DOES NOT KNOW SETTING  . Pain    LEFT SHOULDER  PAIN -HARD TO LIE ON LEFT SIDE FOR LONG PERIOD;  PAIN IN LOWER BADK - 3 HERNIATED DISCS AND STENOISIS -  . PONV (postoperative nausea and vomiting)    THE GAS MAKES ME NAUSEATED  . Restless leg syndrome   . Sciatica   . Tachycardia   . Urinary frequency   . Urticaria   . UTI (urinary tract infection)    Past Surgical History:  Procedure Laterality Date  . ABDOMINAL HYSTERECTOMY     LARGE TUMOR AT OVARY REMOVED  . APPENDECTOMY    . CARPAL TUNNEL RELEASE     BILATERAL  . CATARACT EXTRACTION W/ INTRAOCULAR LENS  IMPLANT, BILATERAL    . CESAREAN SECTION     X 3  . CHOLECYSTECTOMY    . CYSTOSCOPY WITH URETEROSCOPY Right 12/03/2013   Procedure: CYSTOSCOPY WITH RIGHT RETROGRADE URETEROSCOPY LASER LITHOTRIPSY RIGHT STONE RIGHT URETERAL STENT, ;  Surgeon: Raynelle Bring, MD;  Location: [facility];  Service: Urology;  Laterality: Right;  PROCEDURE WAS ORIGINALLY SCHEDULED AS RIGHT PERCUTANEOUS NEPHROLITHOTOMY  . EYELID LACERATION REPAIR     RIGHT EYE  . HERNIA REPAIR     ABDOMINAL HERNIA REPAIR WITH MESH - 3 SURGERIES   . HX OF 19 SURGERIES FOR NECROTIC FASCITIS     SKIN GRAFTS+ WOUND  VAC  . I&D OF INFECTED SITE IN [place] - FROM  AN INJECTION    . JOINT REPLACEMENT     TOTAL RIGHT KNEE REPLACEMENT  . KNEE ARTHROSCOPY  RIGHT AND LEFT   X 2  . [place] RESECTION  2014  . NEPHROLITHOTOMY Right 12/10/2013   Procedure: NEPHROLITHOTOMY PERCUTANEOUS;  Surgeon: Raynelle Bring, MD;  Location: [facility];  Service: Urology;  Laterality: Right;  . REVERSE SHOULDER ARTHROPLASTY Right 07/21/2017  . REVERSE SHOULDER ARTHROPLASTY Right 07/21/2017   Procedure: RIGHT REVERSE SHOULDER ARTHROPLASTY;  Surgeon: Tania Ade, MD;  Location: [place];  Service: Orthopedics;  Laterality: Right;  . RIGHT FOOT DRAINAGE OF INFECTION    . shoulder arthroscopy Right    X 2  . TONSILLECTOMY     AND ADENOIDECTOMY     A IV Location/Drains/Wounds Patient Lines/Drains/Airways Status   Active Line/Drains/Airways    Name:   Placement date:   Placement time:   Site:   Days:   Peripheral IV 06/02/18 Left Hand   06/02/18    0648    Hand   less than 1   Nephrostomy Right 22 Fr.   12/10/13    1720    Right   1635   Ureteral Drain/Stent Right ureter 6 Fr.   12/03/13    1439    Right ureter   1642   Incision (Closed) 12/10/13 Back Right   12/10/13    1735     1635   Incision (Closed) 07/21/17 Shoulder Right   07/21/17    0940     316   Wound 08/15/12 Abrasion(s) Knee Left   08/15/12    1142    Knee   2117   Wound 08/15/12 Abrasion(s) Face Left abrasion to left lower cheek   08/15/12    1143    Face   2117   Wound 08/15/12 Other (Comment) Leg Lower;Right contusion   08/15/12    1142    Leg   2117   Wound 08/15/12 Abdomen Left   08/15/12    2000    Abdomen   2117   Wound 08/15/12 Abdomen Right   08/15/12    2000    Abdomen   2117          Intake/Output Last 24 hours  Intake/Output Summary (Last 24 hours) at 06/02/2018 2142 Last data filed at 06/02/2018 1228 Gross per 24 hour  Intake 501.31 ml  Output 670 ml  Net -168.69 ml    Labs/Imaging Results for orders placed or performed during the hospital encounter of 06/02/18 (from the  past 48 hour(s))  Comprehensive metabolic panel     Status: Abnormal   Collection Time: 06/02/18  7:04 AM  Result Value Ref Range   Sodium 136 135 - 145 mmol/L   Potassium 4.2 3.5 - 5.1 mmol/L   Chloride 98 98 - 111 mmol/L   CO2 28 22 - 32 mmol/L   Glucose, Bld 139 (H) 70 - 99 mg/dL   BUN 26 (H) 8 - 23 mg/dL   Creatinine, Ser 1.08 (H) 0.44 - 1.00 mg/dL   Calcium 9.2 8.9 - 10.3 mg/dL   Total Protein 6.9 6.5 - 8.1 g/dL   Albumin 3.1 (L) 3.5 - 5.0 g/dL   AST 15 15 - 41 U/L   ALT 13 0 - 44 U/L   Alkaline Phosphatase 77 38 - 126 U/L   Total Bilirubin 0.5 0.3 - 1.2 mg/dL   GFR calc non Af Amer 53 (L) >60 mL/min   GFR calc Af Amer >60 >60 mL/min   Anion gap 10 5 - 15    Comment: Performed at [facility], [place] [address], [place], [place] [address]  Lipase, blood     Status: None   Collection Time: 06/02/18  7:04 AM  Result Value Ref Range   Lipase 22 11 - 51 U/L    Comment: Performed at [facility], [place] [address] [place], [place] [address]  CBC with Diff     Status: Abnormal   Collection Time: 06/02/18  7:04 AM  Result Value Ref Range   WBC 3.8 (L) 4.0 - 10.5 K/uL   RBC 3.44 (L) 3.87 - 5.11 MIL/uL   Hemoglobin 8.2 (L) 12.0 - 15.0 g/dL   HCT 28.1 (L) 36.0 - 46.0 %   MCV 81.7 80.0 - 100.0 fL   MCH 23.8 (L) 26.0 - 34.0 pg   MCHC 29.2 (L) 30.0 - 36.0 g/dL   RDW 15.6 (H) 11.5 - 15.5 %   Platelets 208 150 - 400 K/uL   nRBC 0.0 0.0 - 0.2 %   Neutrophils Relative % 68 %   Neutro Abs 2.6 1.7 - 7.7 K/uL   Lymphocytes Relative 18 %   Lymphs Abs 0.7 0.7 - 4.0 K/uL   Monocytes Relative 13 %   Monocytes Absolute 0.5 0.1 - 1.0 K/uL   Eosinophils Relative 0 %   Eosinophils Absolute 0.0 0.0 - 0.5 K/uL   Basophils Relative 0 %   Basophils Absolute 0.0 0.0 - 0.1 K/uL   Immature Granulocytes 1 %   Abs Immature Granulocytes 0.02 0.00 - 0.07 K/uL    Comment: Performed at [facility], [place] [address] [place], [place] [address]   Urinalysis, Routine w reflex microscopic     Status: None   Collection Time: 06/02/18  8:19 AM  Result Value Ref Range   Color, Urine YELLOW YELLOW   APPearance CLEAR CLEAR   Specific Gravity, Urine 1.010 1.005 - 1.030   pH 6.0 5.0 - 8.0   Glucose, UA NEGATIVE NEGATIVE mg/dL   Hgb urine dipstick NEGATIVE NEGATIVE   Bilirubin Urine NEGATIVE NEGATIVE   Ketones, ur NEGATIVE NEGATIVE mg/dL   Protein, ur NEGATIVE NEGATIVE mg/dL   Nitrite NEGATIVE NEGATIVE   Leukocytes,Ua NEGATIVE NEGATIVE    Comment: Microscopic not done on urines with negative protein, blood, leukocytes, nitrite, or glucose < 500 mg/dL. Performed at [facility], [place] [address], [place], [place] [address]   POC occult blood, ED     Status: None   Collection Time:  06/02/18  9:36 AM  Result Value Ref Range   Fecal Occult Bld NEGATIVE NEGATIVE   Mr Lumbar Spine Wo Contrast  Addendum Date: 06/02/2018   ADDENDUM REPORT: 06/02/2018 18:13 ADDENDUM: Study discussed by telephone with Dr. Gareth Morgan on 06/02/2018 at 1759 hours. Electronically Signed   By: Genevie Ann M.D.   On: 06/02/2018 18:13   Result Date: 06/02/2018 CLINICAL DATA:  68 year old female with back pain. Possible cauda equina syndrome. EXAM: MRI LUMBAR SPINE WITHOUT CONTRAST TECHNIQUE: Multiplanar, multisequence MR imaging of the lumbar spine was performed. No intravenous contrast was administered. COMPARISON:  CT Abdomen and Pelvis earlier today. Lumbar MRI 12/25/2017. FINDINGS: Segmentation: Normal on the CT today which is the same numbering system used on the 2019 MRI. Alignment: Stable since 2019. Mildly exaggerated lumbar lordosis. Mild grade 1 anterolisthesis of L5 on S1. Mild retrolisthesis at both L1-L2 and L2-L3. Vertebrae: New abnormal decreased T1 marrow signal about the L1-L2 endplates which demonstrate new abnormal increased signal in the disc space on T2 and STIR. See series 11, image 8 and series 9, image 8. Subtle endplate erosion to  the right of midline on CT today. Abnormal heterogeneous T2 signal occupying space in the ventral epidural space tracking cephalad from the disc (series 9, image 9 and series 12, image 3). Furthermore, this ventral epidural space abnormality tracks cephalad into the lower thoracic spine best seen on series 9, image 8. Prevertebral fat stranding along the right anterior disc (series 13, image 6). And bilateral medial psoas muscle edema. Other lumbar vertebrae appear stable. No other marrow edema or evidence of acute osseous abnormality. Intact visible sacrum and SI joints. Conus medullaris and cauda equina: Conus extends to the L1 level. No lower spinal cord or conus signal abnormality despite the abnormal ventral epidural space from the lower thoracic spine to L1-L2. Paraspinal and other soft tissues: Abnormal paraspinal inflammation at L1-L2 detailed above. No intramuscular abscess. Stable visible abdominal viscera from the CT today. Disc levels: Abnormal ventral epidural space from T10-T11 through L1-L2 as stated above. At L1-L2 the constellation of abnormal disc and epidural findings results in increased and moderate spinal stenosis which is just below the level of the conus. Mild to moderate bilateral L1 foraminal stenosis has not significantly changed. L2-L3: Stable moderate to severe spinal, left lateral recess, and left L2 foraminal stenosis related to disc and posterior element degeneration. Other lumbar levels are stable since 2019. IMPRESSION: 1. Acute Discitis Osteomyelitis at L1-L2 with Ventral Epidural Abscess tracking cephalad from there to the T10-T11 level. Right greater than left mostly ventral paraspinal phlegmon. No paraspinal abscess. 2. Moderate increased Spinal Stenosis related to #1 including at the level of the Conus. No signal abnormality identified in the lower thoracic spinal cord or conus. 3. Elsewhere stable lumbar degenerative changes, including moderate to severe multifactorial spinal,  left lateral recess and left foraminal stenosis at L2-L3. Electronically Signed: By: Genevie Ann M.D. On: 06/02/2018 17:55   Ct Abdomen Pelvis W Contrast  Result Date: 06/02/2018 CLINICAL DATA:  Abdominal pain, by patient report distinct from prior dated 05/31/2018. History of kidney stones, lithotomy, hernia repairs, cholecystectomy, appendectomy, hysterectomy EXAM: CT ABDOMEN AND PELVIS WITH CONTRAST TECHNIQUE: Multidetector CT imaging of the abdomen and pelvis was performed using the standard protocol following bolus administration of intravenous contrast. CONTRAST:  161mL ISOVUE-300 IOPAMIDOL (ISOVUE-300) INJECTION 61% COMPARISON:  CT abdomen pelvis, 05/31/2018 FINDINGS: Lower chest: No acute abnormality. Hepatobiliary: Redemonstrated hepatomegaly. No focal liver abnormality is seen. Status post cholecystectomy. Pancreas:  Redemonstrated low-attenuation, lobulated appearing mass of the pancreatic head, measuring at least 2.7 cm (series 2, image 27). No obvious pancreatic ductal dilatation or surrounding inflammatory changes. Spleen: Redemonstrated splenomegaly, maximum span 13.5 cm. Adrenals/Urinary Tract: Adrenal glands are unremarkable. Small nonobstructive inferior pole calculus of the right kidney. No hydronephrosis. Bladder is unremarkable. Stomach/Bowel: Status post partial gastrectomy. Appendix is surgically absent. No evidence of bowel wall thickening, distention, or inflammatory changes. Large burden of stool in the colon, similar to prior examination. Vascular/Lymphatic: No significant vascular findings are present. No enlarged abdominal or pelvic lymph nodes. Reproductive: No mass or other abnormality. Status post hysterectomy. Other: Large ventral defect of the abdominal wall soft tissues, in keeping with history of ventral hernia repairs. No abdominopelvic ascites. Musculoskeletal: No acute or significant osseous findings. IMPRESSION: 1. No definite acute CT findings of the abdomen or pelvis to  explain pain. 2. Unchanged lobulated, lobulated appearing mass of the pancreatic head, measuring at least 2.7 cm. As on prior examination this likely reflects a mucinous neoplasm, although pancreatic adenocarcinoma is not excluded. Recommend further evaluation with multiphasic contrast-enhanced MRI/MRCP or pancreatic protocol CT. This recommendation follows ACR consensus guidelines: Management of Incidental Pancreatic Cysts: A White Paper of the ACR Incidental Findings Committee. J Am Coll Radiol 4235;36:144-315. 3. Large stool burden throughout the colon, similar to prior study. 4. Other chronic, incidental, and postoperative findings as detailed above. Electronically Signed   By: Eddie Candle M.D.   On: 06/02/2018 08:56    Pending Labs Unresulted Labs (From admission, onward)    Start     Ordered   06/02/18 1804  Blood culture (routine x 2)  BLOOD CULTURE X 2,   STAT     06/02/18 1803          Vitals/Pain Today's Vitals   06/02/18 1229 06/02/18 1355 06/02/18 1412 06/02/18 1745  BP:   100/78   Pulse:   75   Resp:   15   Temp:      TempSrc:      SpO2:   95%   Weight:    108.8 kg  Height:    5\' 1"  (1.549 m)  PainSc: 9  8       Isolation Precautions No active isolations  Medications Medications  iopamidol (ISOVUE-300) 61 % injection (has no administration in time range)  ondansetron (ZOFRAN) injection 4 mg (4 mg Intravenous Given 06/02/18 0756)  HYDROmorphone (DILAUDID) injection 0.5 mg (0.5 mg Intravenous Given 06/02/18 0758)  iopamidol (ISOVUE-300) 61 % injection 100 mL (100 mLs Intravenous Contrast Given 06/02/18 0828)  sodium chloride 0.9 % bolus 500 mL (0 mLs Intravenous Stopped 06/02/18 1228)  dicyclomine (BENTYL) injection 20 mg (20 mg Intramuscular Given 06/02/18 1001)  HYDROmorphone (DILAUDID) injection 0.5 mg (0.5 mg Intravenous Given 06/02/18 1228)  HYDROmorphone (DILAUDID) injection 0.5 mg (0.5 mg Intravenous Given 06/02/18 2050)  ondansetron (ZOFRAN) injection 4 mg (4 mg  Intravenous Given 06/02/18 2050)    Mobility walks with device Moderate fall risk   Focused Assessments Neuro Assessment Handoff:  Swallow screen pass? Yes  Cardiac Rhythm: Normal sinus rhythm       Neuro Assessment:   Neuro Checks:      Last Documented NIHSS Modified Score:   Has TPA been given? No If patient is a Neuro Trauma and patient is going to OR before floor call report to [place] nurse: [phone] or [phone]     R Recommendations: See Admitting Provider Note  Report given to:   Additional  Notes: Pt with back and abdominal pain that has worsened.  Came from [facility] to get MRI of pancreatic mass, found unsuspected abcess on spine with possible osteomyelitis causing source of pain. Neurosurgery has been consulted, but has not rounded on patient as of yet. Pleasant, A/O x 4, usually ambulates at home but pain has made it difficult to ambulate now.   22g LH IV SL Dilaudid and zofran given in last hr for pain Husband at bedside

## 2018-06-03 ENCOUNTER — Inpatient Hospital Stay (HOSPITAL_COMMUNITY): Payer: Medicare Other

## 2018-06-03 DIAGNOSIS — R161 Splenomegaly, not elsewhere classified: Secondary | ICD-10-CM | POA: Diagnosis present

## 2018-06-03 DIAGNOSIS — I700 Atherosclerosis of aorta: Secondary | ICD-10-CM | POA: Diagnosis present

## 2018-06-03 DIAGNOSIS — G062 Extradural and subdural abscess, unspecified: Secondary | ICD-10-CM | POA: Diagnosis present

## 2018-06-03 HISTORY — PX: IR FLUORO GUIDED NEEDLE PLC ASPIRATION/INJECTION LOC: IMG2395

## 2018-06-03 LAB — BLOOD CULTURE ID PANEL (REFLEXED)

## 2018-06-03 LAB — BASIC METABOLIC PANEL
Anion gap: 10 (ref 5–15)
BUN: 16 mg/dL (ref 8–23)
CO2: 27 mmol/L (ref 22–32)
Calcium: 9.6 mg/dL (ref 8.9–10.3)
Chloride: 98 mmol/L (ref 98–111)
Creatinine, Ser: 1.02 mg/dL — ABNORMAL HIGH (ref 0.44–1.00)
GFR calc Af Amer: >60 mL/min (ref >60)
GFR calc non Af Amer: 57 mL/min — ABNORMAL LOW (ref >60)
Glucose, Bld: 167 mg/dL — ABNORMAL HIGH (ref 70–99)
Potassium: 4.2 mmol/L (ref 3.5–5.1)
Sodium: 135 mmol/L (ref 135–145)

## 2018-06-03 LAB — GLUCOSE, CAPILLARY
Glucose-Capillary: 170 mg/dL — ABNORMAL HIGH (ref 70–99)
Glucose-Capillary: 136 mg/dL — ABNORMAL HIGH (ref 70–99)
Glucose-Capillary: 144 mg/dL — ABNORMAL HIGH (ref 70–99)
Glucose-Capillary: 284 mg/dL — ABNORMAL HIGH (ref 70–99)

## 2018-06-03 LAB — CBC
HCT: 29.2 % — ABNORMAL LOW (ref 36.0–46.0)
Hemoglobin: 9.1 g/dL — ABNORMAL LOW (ref 12.0–15.0)
MCH: 24.5 pg — ABNORMAL LOW (ref 26.0–34.0)
MCHC: 31.2 g/dL (ref 30.0–36.0)
MCV: 78.7 fL — ABNORMAL LOW (ref 80.0–100.0)
Platelets: 217 10*3/uL (ref 150–400)
RBC: 3.71 MIL/uL — ABNORMAL LOW (ref 3.87–5.11)
RDW: 15.7 % — ABNORMAL HIGH (ref 11.5–15.5)
WBC: 4.5 10*3/uL (ref 4.0–10.5)
nRBC: 0 % (ref 0.0–0.2)

## 2018-06-03 LAB — HIV ANTIBODY (ROUTINE TESTING W REFLEX): HIV Screen 4th Generation wRfx: NONREACTIVE

## 2018-06-03 LAB — PROTIME-INR
INR: 1.2 (ref 0.8–1.2)
Prothrombin Time: 14.7 seconds (ref 11.4–15.2)

## 2018-06-03 MED ORDER — FENTANYL CITRATE (PF) 100 MCG/2ML IJ SOLN
INTRAMUSCULAR | Status: AC
  Filled 2018-06-03: qty 2

## 2018-06-03 MED ORDER — MIDAZOLAM HCL 2 MG/2ML IJ SOLN
INTRAMUSCULAR | Status: AC
  Filled 2018-06-03: qty 2

## 2018-06-03 MED ORDER — ACETAMINOPHEN 500 MG PO TABS
1000.0000 mg | ORAL_TABLET | Freq: Three times a day (TID) | ORAL | Status: DC
  Administered 2018-06-03 – 2018-06-07 (×11): 1000 mg via ORAL
  Filled 2018-06-03 (×12): qty 2

## 2018-06-03 MED ORDER — VANCOMYCIN HCL IN DEXTROSE 1-5 GM/200ML-% IV SOLN
1000.0000 mg | INTRAVENOUS | Status: DC
  Filled 2018-06-03: qty 200

## 2018-06-03 MED ORDER — BISACODYL 10 MG RE SUPP: 10.0000 mg | Freq: Every day | RECTAL | Status: DC | PRN

## 2018-06-03 MED ORDER — VANCOMYCIN HCL 10 G IV SOLR
2000.0000 mg | Freq: Once | INTRAVENOUS | Status: AC
  Administered 2018-06-03: 2000 mg via INTRAVENOUS
  Filled 2018-06-03: qty 2000

## 2018-06-03 MED ORDER — CYCLOBENZAPRINE HCL 10 MG PO TABS
10.0000 mg | ORAL_TABLET | Freq: Every day | ORAL | Status: DC | PRN
  Administered 2018-06-04 – 2018-06-06 (×3): 10 mg via ORAL
  Filled 2018-06-03 (×4): qty 1

## 2018-06-03 MED ORDER — LIDOCAINE 5 % EX PTCH
1.0000 | MEDICATED_PATCH | CUTANEOUS | Status: DC
  Administered 2018-06-04 – 2018-06-06 (×3): 1 via TRANSDERMAL
  Filled 2018-06-03 (×4): qty 1

## 2018-06-03 MED ORDER — INSULIN ASPART 100 UNIT/ML ~~LOC~~ SOLN
0.0000 [IU] | SUBCUTANEOUS | Status: DC
  Administered 2018-06-03: 5 [IU] via SUBCUTANEOUS
  Administered 2018-06-03: 1 [IU] via SUBCUTANEOUS
  Administered 2018-06-04: 2 [IU] via SUBCUTANEOUS
  Administered 2018-06-04: 3 [IU] via SUBCUTANEOUS

## 2018-06-03 MED ORDER — FENTANYL CITRATE (PF) 100 MCG/2ML IJ SOLN
INTRAMUSCULAR | Status: AC | PRN
  Administered 2018-06-03: 50 ug via INTRAVENOUS

## 2018-06-03 MED ORDER — POLYETHYLENE GLYCOL 3350 17 G PO PACK
17.0000 g | PACK | Freq: Two times a day (BID) | ORAL | Status: DC
  Administered 2018-06-03 – 2018-06-07 (×6): 17 g via ORAL
  Filled 2018-06-03 (×8): qty 1

## 2018-06-03 MED ORDER — LIDOCAINE HCL 1 % IJ SOLN
INTRAMUSCULAR | Status: AC
  Filled 2018-06-03: qty 20

## 2018-06-03 MED ORDER — MIDAZOLAM HCL 2 MG/2ML IJ SOLN
INTRAMUSCULAR | Status: AC | PRN
  Administered 2018-06-03: 1 mg via INTRAVENOUS

## 2018-06-03 MED ORDER — SENNOSIDES-DOCUSATE SODIUM 8.6-50 MG PO TABS
2.0000 | ORAL_TABLET | Freq: Every day | ORAL | Status: DC
  Administered 2018-06-03 – 2018-06-06 (×4): 2 via ORAL
  Filled 2018-06-03 (×4): qty 2
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
## 2018-06-03 NOTE — Progress Notes (Signed)
Patient ID: [name], female   DOB: Apr 07, 1950, 68 y.o.   MRN: [id number] No change in exam.  She does have urinary retention but again she states she has had that for about a week.  I do not believe that this represents cauda equina syndrome.  She has no other signs such as loss of reflux or weakness.  SHe does not have perineal numbness.  I do not believe that the very thin ventral epidural abscess causes enough increase in spinal stenosis to cause  urinary retention.  I think it is probably related to pain and the lack of mobilization.  I do not believe that this urinary retention without other neurologic compromise is necessarily indication for now decompression.  Still recommend attaining diagnosis via blood cultures or interventional radiology aspiration at L1-2, and culture directed antibiotics.  Have spoken with the admitting physician

## 2018-06-03 NOTE — Consult Note (Signed)
[name] for Infectious Disease    Date of Admission:  06/02/2018          Reason for Consult: Lumbar discitis with epidural abscess    Referring Provider: Dr. Fayrene Helper  Assessment: She has acute lumbar discitis complicated by a small epidural abscess.  I have discussed the case with Dr. Florene Glen.  We have agreed to start empiric vancomycin once the aspirate is obtained.  Plan: 1. Start vancomycin after the aspirate is obtained 2. I will follow-up tomorrow  Principal Problem:   Lumbar discitis Active Problems:   Epidural abscess   OSA on CPAP   Diabetes mellitus type 2 in obese (HCC)   Graves disease   Essential hypertension   Chronic kidney disease, stage II (mild)   Anemia due to chronic kidney disease   Aortic atherosclerosis (HCC)   Splenomegaly   Scheduled Meds: . acetaminophen  1,000 mg Oral Q8H  . amLODipine  5 mg Oral QHS  . ARIPiprazole  5 mg Oral Daily  . cyclobenzaprine  10 mg Oral QHS  . doxazosin  2 mg Oral QHS  . fenofibrate  54 mg Oral Q supper  . fentaNYL      . FLUoxetine  60 mg Oral QHS  . gabapentin  100 mg Oral TID  . insulin aspart  0-9 Units Subcutaneous Q4H  . insulin glargine  30 Units Subcutaneous BID  . lidocaine  1 patch Transdermal Q24H  . magnesium oxide  400 mg Oral Q lunch  . metoprolol tartrate  50 mg Oral Q8H  . midazolam      . pantoprazole  40 mg Oral Daily  . polyethylene glycol  17 g Oral BID  . potassium chloride SA  20 mEq Oral Q lunch  . propylthiouracil  75 mg Oral Daily  . rOPINIRole  3 mg Oral QHS  . senna-docusate  2 tablet Oral QHS  . vitamin B-12  1,000 mcg Oral Daily   Continuous Infusions: PRN Meds:.bisacodyl, clonazePAM, cyclobenzaprine, HYDROmorphone (DILAUDID) injection, Melatonin, ondansetron **OR** ondansetron (ZOFRAN) IV  HPI: [name] is a 68 y.o. female with chronic back pain and diabetes.  She recently had an epidural injection on 05/23/2018.  Shortly afterwards she developed  acute on chronic low back pain.  She was seen in the ED on 05/31/2018 and an abdominal CT scan showed what appeared to be a pancreatic mass.  She returned with increasing pain and was admitted yesterday.  CT and MRI revealed L1-2 discitis with a thin epidural abscess.  She is afebrile and currently undergoing aspiration by interventional radiology.   Review of Systems: Review of Systems  Unable to perform ROS: Mental acuity    Past Medical History:  Diagnosis Date  . Anemia   . Arthritis   . Asthma    OCCAS  . Bell's palsy   . Broken neck ([place])    C-1  . Carbuncle and furuncle of trunk   . Chronic dislocation of right shoulder   . Depression   . Disorder of fascia    HX OF NECROTIC FASCITIS AFTER ABDOMINAL SURGERY FOR HERNIA- REQUIRED 19 SURGERIES AND 2.5 MONTH HOSPITALIZATION AT [facility]  . DM (diabetes mellitus) ([place])   . Dysrhythmia    HX OF TACHYCARDIA AND BRADYCARDIA - ON GOING FOR YEARS - DOES NOT HAVE TO SEE CARDIOLOGIST  . Elevated cholesterol   . Fibromyalgia   . Fracture MAY 2014   HX OF FRACTURED NECK  C1- CAUSES SEVERE HEADACHES--LIMITED ROM NECK  . Frequent infections    ESPECIALLY PRONE TO INFECTIONS AFTER SURGERIES  . Graves disease   . Heart murmur    DOES NOT CAUSE ANY PROBLEMS  . History of kidney stones   . Hyperlipidemia   . Hypertension   . Hypothyroidism    GRAVES DISEASE  . Migraine   . 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 Neuropathy   . Obesity   . OSA (obstructive sleep apnea)    USES CPAP - DOES NOT KNOW SETTING  . Pain    LEFT SHOULDER  PAIN -HARD TO LIE ON LEFT SIDE FOR LONG PERIOD;  PAIN IN LOWER BADK - 3 HERNIATED DISCS AND STENOISIS -  . PONV (postoperative nausea and vomiting)    THE GAS MAKES ME NAUSEATED  . Restless leg syndrome   . Sciatica   . Tachycardia   . Urinary frequency   . Urticaria   . UTI (urinary tract infection)     Social History   Tobacco Use  . Smoking status: Never Smoker  .  Smokeless tobacco: Never Used  Substance Use Topics  . Alcohol use: No  . Drug use: No    Family History  Problem Relation Age of Onset  . Diabetes Father   . Osteoarthritis Father   . Heart disease Father   . Ulcers Father   . Stroke Mother   . Suicidality Sister    Allergies  Allergen Reactions  . Imitrex [Sumatriptan] Other (See Comments)    Vascular spasms  . Morphine Other (See Comments)    Severe headaches   . Nsaids Other (See Comments)    PT UNABLE TO TOLERATE NSAID'S DRUGS DUE TO HX OF GASTRIC SLEEVE SURGERY  . Codeine Nausea Only  . Promethazine Hcl Other (See Comments)    Restless leg feeling all over body  . Rosuvastatin Calcium Other (See Comments)    Leg/muscle pain  . Statins Other (See Comments)    Leg/muscle pain  . Sulfonamide Derivatives Hives    Childhood allergy   . Vicodin [Hydrocodone-Acetaminophen] Nausea And Vomiting    OBJECTIVE: Blood pressure (!) 117/58, pulse 70, temperature 98.8 F (37.1 C), temperature source Oral, resp. rate 18, height 5\' 1"  (1.549 m), weight 108.8 kg, SpO2 91 %.  Physical Exam Constitutional:      Comments: She is currently not available for examination.     Lab Results Lab Results  Component Value Date   WBC 4.5 06/03/2018   HGB 9.1 (L) 06/03/2018   HCT 29.2 (L) 06/03/2018   MCV 78.7 (L) 06/03/2018   PLT 217 06/03/2018    Lab Results  Component Value Date   CREATININE 1.02 (H) 06/03/2018   BUN 16 06/03/2018   NA 135 06/03/2018   K 4.2 06/03/2018   CL 98 06/03/2018   CO2 27 06/03/2018    Lab Results  Component Value Date   ALT 13 06/02/2018   AST 15 06/02/2018   ALKPHOS 77 06/02/2018   BILITOT 0.5 06/02/2018     Microbiology: Recent Results (from the past 240 hour(s))  Urine culture     Status: None   Collection Time: 05/31/18 10:40 AM  Result Value Ref Range Status   Specimen Description   Final    URINE, CLEAN CATCH Performed at [facility], [place] [address],  [place], [place] [address]    Special Requests   Final  NONE Performed at [facility], [place] [address], [place], [place] [address]    Culture   Final    NO GROWTH Performed at [facility], [place] [address] [place], [place] [address]    Report Status 06/01/2018 FINAL  Final  Blood culture (routine x 2)     Status: None (Preliminary result)   Collection Time: 06/02/18  7:05 PM  Result Value Ref Range Status   Specimen Description BLOOD RIGHT WRIST  Final   Special Requests   Final    BOTTLES DRAWN AEROBIC AND ANAEROBIC Blood Culture adequate volume   Culture   Final    NO GROWTH < 24 HOURS Performed at [facility], [place] [address] [place], [place] [address]    Report Status PENDING  Incomplete  Blood culture (routine x 2)     Status: None (Preliminary result)   Collection Time: 06/02/18  7:05 PM  Result Value Ref Range Status   Specimen Description BLOOD RIGHT FOREARM  Final   Special Requests   Final    BOTTLES DRAWN AEROBIC AND ANAEROBIC Blood Culture adequate volume   Culture   Final    NO GROWTH < 24 HOURS Performed at [facility], [place] [address] [place],  [address]    Report Status PENDING  Incomplete    Michel Bickers, MD [facility] for [facility]ious [place] [facility] [phone] pager   [phone] cell 06/03/2018, 5:40 PM

## 2018-06-03 NOTE — Procedures (Signed)
Pre procedural Dx: Discitis Osteomyelitis Post procedural Dx: Same  Technically successful image guided aspiration of the L1-L2 intervertebral disc space. All aspirated samples sent to the laboratory for analysis.    EBL: None Complications: None immediate  Ronny Bacon, MD Pager #: [phone]

## 2018-06-03 NOTE — Progress Notes (Signed)
Pharmacy Antibiotic Note  [name] is a 68 y.o. female admitted on 06/02/2018 with lumbar discitis.  Pharmacy has been consulted for Vancomycin dosing.  To begin after aspiration per IR Goal AUC 400 to 500  Plan: Vancomycin 2000 mg iv x 1 then Vancomycin 1 gram iv Q 24 hours Predicted AUC of 478 Trough of 12.6 Scr used 1.02  Will begin therapy after disc aspiration Follow up progress, Scr   Height: 5\' 1"  (154.9 cm) Weight: 239 lb 13.8 oz (108.8 kg) IBW/kg (Calculated) : 47.8  Temp (24hrs), Avg:99.1 F (37.3 C), Min:98.8 F (37.1 C), Max:99.6 F (37.6 C)  Recent Labs  Lab 05/31/18 1117 06/02/18 0704 06/03/18 0338  WBC 3.8* 3.8* 4.5  CREATININE 1.16* 1.08* 1.02*    Estimated Creatinine Clearance: 61.8 mL/min (A) (by C-G formula based on SCr of 1.02 mg/dL (H)).    Allergies  Allergen Reactions  . Imitrex [Sumatriptan] Other (See Comments)    Vascular spasms  . Morphine Other (See Comments)    Severe headaches   . Nsaids Other (See Comments)    PT UNABLE TO TOLERATE NSAID'S DRUGS DUE TO HX OF GASTRIC SLEEVE SURGERY  . Codeine Nausea Only  . Promethazine Hcl Other (See Comments)    Restless leg feeling all over body  . Rosuvastatin Calcium Other (See Comments)    Leg/muscle pain  . Statins Other (See Comments)    Leg/muscle pain  . Sulfonamide Derivatives Hives    Childhood allergy   . Vicodin [Hydrocodone-Acetaminophen] Nausea And Vomiting      Thank you  Anette Guarneri, PharmD [phone] 06/03/2018 5:38 PM

## 2018-06-03 NOTE — Progress Notes (Signed)
PROGRESS NOTE    [name]  ZOX:[id number] DOB: 05/23/1950 DOA: 06/02/2018 PCP: Tiffany Ran, MD   Brief Narrative:  [name] is [name] 68 y.o. female with history of diabetes mellitus type 2, sleep apnea, hypertension, Graves' disease, chronic and disease, anemia was been experiencing low back pain since May 25, 2018 almost for last 1 week which has been increasing in intensity to the point patient is finding it difficult to walk.  Did not lose function of her extremities or did not have any incontinence of urine or bowel.  Patient had [name] epidural injection on May 23, 2018.  2 days later patient symptoms started coming.  Also had abdominal pain with some nausea.  Had come to the ER 3 days ago had CAT scan done which showed pancreatic mass and was advised to follow-up as outpatient.  Since pain worsened with difficulty walking now patient came back to the ER again.  Patient states he gets subjective feeling of fever and chills over the last couple of days.  Assessment & Plan:   Principal Problem:   Lumbar discitis Active Problems:   OSA on CPAP   Diabetes mellitus type 2 in obese (HCC)   Graves disease   Essential hypertension   Chronic kidney disease, stage II (mild)   Anemia due to chronic kidney disease   1. Lumbar discitis osteomyelitis at L1-L2  Ventral Epidural Abscess  Right > Left Paraspinal Phlegmon:  1. Pt hemodynamically stable, currently holding abx until cx obtained 2. Appreciate neurosurgery recs - no indication for surgery at this time - recommending blood cx and disc space aspiration via IR 3. Discussed with ID, who is aware of pt.  Will plan to start vancomycin after cultures obtained. 4. Follow blood cultures. 5. IR consulted for disc space aspiration 6. Scheduled APAP, prn dilaudid  2. Urinary Retention: discussed with neurosurgery, low suspicion that this is due to epidural abscess.  Suspected 2/2 pain and lack of mobilization.  Constipation also could be  contributing.   1. Follow bladder scans, I/O cath prn.  Place foley if needed.  3. Constipation: large stool burden on imaging.  Bowel regimen.   4. Pancreatic mass: lobulated appearing mass of pancreatic head, concerning for mucinous neoplasm (vs pancreatic adenocarcinoma) 1. Needs follow up MRI/MRCP or pancreatic protocol CT when able  5. Diabetes mellitus type 2  1. Lantus 35 units BID 2. SSI q4 while NPO for procedure today   6. History of Graves' disease on propylthiouracil.  Follow CBC closely.  7. Chronic kidney disease stage II creatinine appears to be at baseline.  8. Anemia, microcytic 1. Follow iron panel, b12, folate 2. Ensure routine CRC screening    9. Sleep apnea on CPAP.  10. Hypertension on metoprolol amlodipine and Cardura.  11. Restless leg syndrome on Requip.  12. Depression continue fluoxetine.  13. Anxiety on clonazepam.  DVT prophylaxis: SCD Code Status: full  Family Communication: none at bedside Disposition Plan: pending further improvement  Consultants:   IR  ID  Neurosurgery  Procedures:   none  Antimicrobials: Anti-infectives (From admission, onward)   None      Subjective: C/o persistent pain to lower back.  Objective: Vitals:   06/02/18 2350 06/03/18 0322 06/03/18 0722 06/03/18 1210  BP:  128/60 (!) 140/45 (!) 112/55  Pulse: 87 75 74 69  Resp:  20 18 18   Temp:  98.8 F (37.1 C) 99.6 F (37.6 C) 99.2 F (37.3 C)  TempSrc:  Oral Oral  Oral  SpO2: 96% 98% 91% (!) 89%  Weight:      Height:       No intake or output data in the 24 hours ending 06/03/18 1430 Filed Weights   06/02/18 1745  Weight: 108.8 kg    Examination:  General exam: Appears calm and comfortable.  Obese. Respiratory system: Clear to auscultation. Respiratory effort normal. Cardiovascular system: S1 & S2 heard, RRR.  Gastrointestinal system: Abdomen is nondistended, soft and nontender.  Central nervous system: Alert and oriented. No focal  neurological deficits.  Downgoing toes.  No saddle anesthesia. MSK: unable to turn for me to examine her back Extremities: 5/5 strength to lower extremities.  Skin: No rashes, lesions or ulcers Psychiatry: Judgement and insight appear normal. Mood & affect appropriate.     Data Reviewed: I have personally reviewed following labs and imaging studies  CBC: Recent Labs  Lab 05/31/18 1117 06/02/18 0704 06/03/18 0338  WBC 3.8* 3.8* 4.5  NEUTROABS  --  2.6  --   HGB 9.5* 8.2* 9.1*  HCT 31.7* 28.1* 29.2*  MCV 79.8* 81.7 78.7*  PLT 196 208 217   Basic Metabolic Panel: Recent Labs  Lab 05/31/18 1117 06/02/18 0704 06/03/18 0338  NA 136 136 135  K 4.2 4.2 4.2  CL 99 98 98  CO2 27 28 27   GLUCOSE 129* 139* 167*  BUN 36* 26* 16  CREATININE 1.16* 1.08* 1.02*  CALCIUM 9.3 9.2 9.6   GFR: Estimated Creatinine Clearance: 61.8 mL/min ([name]) (by C-G formula based on SCr of 1.02 mg/dL (H)). Liver Function Tests: Recent Labs  Lab 05/31/18 1117 06/02/18 0704  AST 17 15  ALT 14 13  ALKPHOS 78 77  BILITOT 0.5 0.5  PROT 7.7 6.9  ALBUMIN 3.7 3.1*   Recent Labs  Lab 05/31/18 1117 06/02/18 0704  LIPASE 23 22   No results for input(s): AMMONIA in the last 168 hours. Coagulation Profile: Recent Labs  Lab 06/03/18 0852  INR 1.2   Cardiac Enzymes: No results for input(s): CKTOTAL, CKMB, CKMBINDEX, TROPONINI in the last 168 hours. BNP (last 3 results) No results for input(s): PROBNP in the last 8760 hours. HbA1C: No results for input(s): HGBA1C in the last 72 hours. CBG: Recent Labs  Lab 06/02/18 2322 06/03/18 0606 06/03/18 1144  GLUCAP 200* 170* 136*   Lipid Profile: No results for input(s): CHOL, HDL, LDLCALC, TRIG, CHOLHDL, LDLDIRECT in the last 72 hours. Thyroid Function Tests: No results for input(s): TSH, T4TOTAL, FREET4, T3FREE, THYROIDAB in the last 72 hours. Anemia Panel: No results for input(s): VITAMINB12, FOLATE, FERRITIN, TIBC, IRON, RETICCTPCT in the last  72 hours. Sepsis Labs: No results for input(s): PROCALCITON, LATICACIDVEN in the last 168 hours.  Recent Results (from the past 240 hour(s))  Urine culture     Status: None   Collection Time: 05/31/18 10:40 AM  Result Value Ref Range Status   Specimen Description   Final    URINE, CLEAN CATCH Performed at [facility], [address] [place], [place] [address]    Special Requests   Final    NONE Performed at [facility], [address], [place], [place] [address]    Culture   Final    NO GROWTH Performed at [facility], [address] [place], [place] [address]    Report Status 06/01/2018 FINAL  Final  Blood culture (routine x 2)     Status: None (Preliminary result)   Collection Time: 06/02/18  7:05 PM  Result Value Ref Range Status   Specimen Description BLOOD RIGHT WRIST  Final   Special Requests   Final    BOTTLES DRAWN AEROBIC AND ANAEROBIC Blood Culture adequate volume Performed at [facility], [address] [place], [place] [address]    Culture PENDING  Incomplete   Report Status PENDING  Incomplete  Blood culture (routine x 2)     Status: None (Preliminary result)   Collection Time: 06/02/18  7:05 PM  Result Value Ref Range Status   Specimen Description BLOOD RIGHT FOREARM  Final   Special Requests   Final    BOTTLES DRAWN AEROBIC AND ANAEROBIC Blood Culture adequate volume Performed at [facility], [address] [place], [place] [address]    Culture PENDING  Incomplete   Report Status PENDING  Incomplete         Radiology Studies: Mr Lumbar Spine Wo Contrast  Addendum Date: 06/02/2018   ADDENDUM REPORT: 06/02/2018 18:13 ADDENDUM: Study discussed by telephone with Dr. Alvira Monday on 06/02/2018 at 1759 hours. Electronically Signed   By: Odessa Fleming M.D.   On: 06/02/2018 18:13   Result Date: 06/02/2018 CLINICAL DATA:  68 year old female with back pain. Possible cauda equina syndrome. EXAM: MRI  LUMBAR SPINE WITHOUT CONTRAST TECHNIQUE: Multiplanar, multisequence MR imaging of the lumbar spine was performed. No intravenous contrast was administered. COMPARISON:  CT Abdomen and Pelvis earlier today. Lumbar MRI 12/25/2017. FINDINGS: Segmentation: Normal on the CT today which is the same numbering system used on the 2019 MRI. Alignment: Stable since 2019. Mildly exaggerated lumbar lordosis. Mild grade 1 anterolisthesis of L5 on S1. Mild retrolisthesis at both L1-L2 and L2-L3. Vertebrae: New abnormal decreased T1 marrow signal about the L1-L2 endplates which demonstrate new abnormal increased signal in the disc space on T2 and STIR. See series 11, image 8 and series 9, image 8. Subtle endplate erosion to the right of midline on CT today. Abnormal heterogeneous T2 signal occupying space in the ventral epidural space tracking cephalad from the disc (series 9, image 9 and series 12, image 3). Furthermore, this ventral epidural space abnormality tracks cephalad into the lower thoracic spine best seen on series 9, image 8. Prevertebral fat stranding along the right anterior disc (series 13, image 6). And bilateral medial psoas muscle edema. Other lumbar vertebrae appear stable. No other marrow edema or evidence of acute osseous abnormality. Intact visible sacrum and SI joints. Conus medullaris and cauda equina: Conus extends to the L1 level. No lower spinal cord or conus signal abnormality despite the abnormal ventral epidural space from the lower thoracic spine to L1-L2. Paraspinal and other soft tissues: Abnormal paraspinal inflammation at L1-L2 detailed above. No intramuscular abscess. Stable visible abdominal viscera from the CT today. Disc levels: Abnormal ventral epidural space from T10-T11 through L1-L2 as stated above. At L1-L2 the constellation of abnormal disc and epidural findings results in increased and moderate spinal stenosis which is just below the level of the conus. Mild to moderate bilateral L1  foraminal stenosis has not significantly changed. L2-L3: Stable moderate to severe spinal, left lateral recess, and left L2 foraminal stenosis related to disc and posterior element degeneration. Other lumbar levels are stable since 2019. IMPRESSION: 1. Acute Discitis Osteomyelitis at L1-L2 with Ventral Epidural Abscess tracking cephalad from there to the T10-T11 level. Right greater than left mostly ventral paraspinal phlegmon. No paraspinal abscess. 2. Moderate increased Spinal Stenosis related to #1 including at the level of the Conus.  No signal abnormality identified in the lower thoracic spinal cord or conus. 3. Elsewhere stable lumbar degenerative changes, including moderate to severe multifactorial spinal, left lateral recess and left foraminal stenosis at L2-L3. Electronically Signed: By: Odessa Fleming M.D. On: 06/02/2018 17:55   Ct Abdomen Pelvis W Contrast  Result Date: 06/02/2018 CLINICAL DATA:  Abdominal pain, by patient report distinct from prior dated 05/31/2018. History of kidney stones, lithotomy, hernia repairs, cholecystectomy, appendectomy, hysterectomy EXAM: CT ABDOMEN AND PELVIS WITH CONTRAST TECHNIQUE: Multidetector CT imaging of the abdomen and pelvis was performed using the standard protocol following bolus administration of intravenous contrast. CONTRAST:  ISOVUE-300 IOPAMIDOL (ISOVUE-300) INJECTION 61% COMPARISON:  CT abdomen pelvis, 05/31/2018 FINDINGS: Lower chest: No acute abnormality. Hepatobiliary: Redemonstrated hepatomegaly. No focal liver abnormality is seen. Status post cholecystectomy. Pancreas: Redemonstrated low-attenuation, lobulated appearing mass of the pancreatic head, measuring at least 2.7 cm (series 2, image 27). No obvious pancreatic ductal dilatation or surrounding inflammatory changes. Spleen: Redemonstrated splenomegaly, maximum span 13.5 cm. Adrenals/Urinary Tract: Adrenal glands are unremarkable. Small nonobstructive inferior pole calculus of the right kidney. No  hydronephrosis. Bladder is unremarkable. Stomach/Bowel: Status post partial gastrectomy. Appendix is surgically absent. No evidence of bowel wall thickening, distention, or inflammatory changes. Large burden of stool in the colon, similar to prior examination. Vascular/Lymphatic: No significant vascular findings are present. No enlarged abdominal or pelvic lymph nodes. Reproductive: No mass or other abnormality. Status post hysterectomy. Other: Large ventral defect of the abdominal wall soft tissues, in keeping with history of ventral hernia repairs. No abdominopelvic ascites. Musculoskeletal: No acute or significant osseous findings. IMPRESSION: 1. No definite acute CT findings of the abdomen or pelvis to explain pain. 2. Unchanged lobulated, lobulated appearing mass of the pancreatic head, measuring at least 2.7 cm. As on prior examination this likely reflects [name] mucinous neoplasm, although pancreatic adenocarcinoma is not excluded. Recommend further evaluation with multiphasic contrast-enhanced MRI/MRCP or pancreatic protocol CT. This recommendation follows ACR consensus guidelines: Management of Incidental Pancreatic Cysts: Da Michelle White Paper of the ACR Incidental Findings Committee. J Am Coll Radiol 2017;14:911-923. 3. Large stool burden throughout the colon, similar to prior study. 4. Other chronic, incidental, and postoperative findings as detailed above. Electronically Signed   By: Lauralyn Primes M.D.   On: 06/02/2018 08:56        Scheduled Meds: . acetaminophen  1,000 mg Oral Q8H  . amLODipine  5 mg Oral QHS  . ARIPiprazole  5 mg Oral Daily  . cyclobenzaprine  10 mg Oral QHS  . doxazosin  2 mg Oral QHS  . fenofibrate  54 mg Oral Q supper  . FLUoxetine  60 mg Oral QHS  . gabapentin  100 mg Oral TID  . insulin aspart  0-9 Units Subcutaneous Q4H  . insulin glargine  30 Units Subcutaneous BID  . magnesium oxide  400 mg Oral Q lunch  . metoprolol tartrate  50 mg Oral Q8H  . pantoprazole  40 mg Oral  Daily  . potassium chloride SA  20 mEq Oral Q lunch  . propylthiouracil  75 mg Oral Daily  . rOPINIRole  3 mg Oral QHS  . vitamin B-12  1,000 mcg Oral Daily   Continuous Infusions:   LOS: 1 day    Time spent: over 30 min    Lacretia Nicks, MD [facility] Pager AMION  If 7PM-7AM, please contact night-coverage [email or web address] Password [email or web address] 06/03/2018, 2:30 PM

## 2018-06-03 NOTE — Progress Notes (Signed)
PHARMACY - PHYSICIAN COMMUNICATION CRITICAL VALUE ALERT - BLOOD CULTURE IDENTIFICATION (BCID)  [name] is an 68 y.o. female who presented to [facility] on 06/02/2018 with a chief complaint of lumbar discitis  Assessment:  Positive blood culture with GPCs identified as staph species Dickey A +; suspected source lumbar discitis  Current antibiotics: Vancomycin 2 gms x 1, then 1 gm IV q24hr  Changes to prescribed antibiotics recommended:  Patient is on recommended antibiotics - No changes needed  Results for orders placed or performed during the hospital encounter of 06/02/18  Blood Culture ID Panel (Reflexed) (Collected: 06/02/2018  7:05 PM)  Result Value Ref Range   Enterococcus species NOT DETECTED NOT DETECTED   Listeria monocytogenes NOT DETECTED NOT DETECTED   Staphylococcus species DETECTED (A) NOT DETECTED   Staphylococcus aureus (BCID) NOT DETECTED NOT DETECTED   Methicillin resistance DETECTED (A) NOT DETECTED   Streptococcus species NOT DETECTED NOT DETECTED   Streptococcus agalactiae NOT DETECTED NOT DETECTED   Streptococcus pneumoniae NOT DETECTED NOT DETECTED   Streptococcus pyogenes NOT DETECTED NOT DETECTED   Acinetobacter baumannii NOT DETECTED NOT DETECTED   Enterobacteriaceae species NOT DETECTED NOT DETECTED   Enterobacter cloacae complex NOT DETECTED NOT DETECTED   Escherichia coli NOT DETECTED NOT DETECTED   Klebsiella oxytoca NOT DETECTED NOT DETECTED   Klebsiella pneumoniae NOT DETECTED NOT DETECTED   Proteus species NOT DETECTED NOT DETECTED   Serratia marcescens NOT DETECTED NOT DETECTED   Haemophilus influenzae NOT DETECTED NOT DETECTED   Neisseria meningitidis NOT DETECTED NOT DETECTED   Pseudomonas aeruginosa NOT DETECTED NOT DETECTED   Candida albicans NOT DETECTED NOT DETECTED   Candida glabrata NOT DETECTED NOT DETECTED   Candida krusei NOT DETECTED NOT DETECTED   Candida parapsilosis NOT DETECTED NOT DETECTED   Candida tropicalis NOT DETECTED  NOT DETECTED   Alanda Slim, PharmD, FCCM Clinical Pharmacist Please see AMION for all Pharmacists' Contact Phone Numbers 06/03/2018, 7:11 PM

## 2018-06-04 ENCOUNTER — Encounter (HOSPITAL_COMMUNITY): Payer: Self-pay | Admitting: Interventional Radiology

## 2018-06-04 DIAGNOSIS — B958 Unspecified staphylococcus as the cause of diseases classified elsewhere: Secondary | ICD-10-CM

## 2018-06-04 DIAGNOSIS — K862 Cyst of pancreas: Secondary | ICD-10-CM | POA: Diagnosis present

## 2018-06-04 DIAGNOSIS — Z885 Allergy status to narcotic agent status: Secondary | ICD-10-CM

## 2018-06-04 DIAGNOSIS — Z8739 Personal history of other diseases of the musculoskeletal system and connective tissue: Secondary | ICD-10-CM

## 2018-06-04 DIAGNOSIS — Z96611 Presence of right artificial shoulder joint: Secondary | ICD-10-CM

## 2018-06-04 DIAGNOSIS — M4646 Discitis, unspecified, lumbar region: Secondary | ICD-10-CM

## 2018-06-04 DIAGNOSIS — Z96651 Presence of right artificial knee joint: Secondary | ICD-10-CM

## 2018-06-04 DIAGNOSIS — R7881 Bacteremia: Secondary | ICD-10-CM

## 2018-06-04 DIAGNOSIS — Z888 Allergy status to other drugs, medicaments and biological substances status: Secondary | ICD-10-CM

## 2018-06-04 DIAGNOSIS — Z886 Allergy status to analgesic agent status: Secondary | ICD-10-CM

## 2018-06-04 DIAGNOSIS — K8689 Other specified diseases of pancreas: Secondary | ICD-10-CM

## 2018-06-04 LAB — MAGNESIUM: Magnesium: 1.9 mg/dL (ref 1.7–2.4)

## 2018-06-04 LAB — CBC
HCT: 29.8 % — ABNORMAL LOW (ref 36.0–46.0)
Hemoglobin: 9.1 g/dL — ABNORMAL LOW (ref 12.0–15.0)
MCH: 24 pg — ABNORMAL LOW (ref 26.0–34.0)
MCHC: 30.5 g/dL (ref 30.0–36.0)
MCV: 78.6 fL — ABNORMAL LOW (ref 80.0–100.0)
Platelets: 204 10*3/uL (ref 150–400)
RBC: 3.79 MIL/uL — ABNORMAL LOW (ref 3.87–5.11)
RDW: 15.4 % (ref 11.5–15.5)
WBC: 3.4 10*3/uL — ABNORMAL LOW (ref 4.0–10.5)
nRBC: 0 % (ref 0.0–0.2)

## 2018-06-04 LAB — GLUCOSE, CAPILLARY
Glucose-Capillary: 170 mg/dL — ABNORMAL HIGH (ref 70–99)
Glucose-Capillary: 152 mg/dL — ABNORMAL HIGH (ref 70–99)
Glucose-Capillary: 171 mg/dL — ABNORMAL HIGH (ref 70–99)
Glucose-Capillary: 173 mg/dL — ABNORMAL HIGH (ref 70–99)
Glucose-Capillary: 133 mg/dL — ABNORMAL HIGH (ref 70–99)

## 2018-06-04 LAB — CULTURE, BLOOD (ROUTINE X 2): Special Requests: ADEQUATE

## 2018-06-04 LAB — VITAMIN B12: Vitamin B-12: 3000 pg/mL — ABNORMAL HIGH (ref 180–914)

## 2018-06-04 LAB — COMPREHENSIVE METABOLIC PANEL
ALT: 13 U/L (ref 0–44)
AST: 16 U/L (ref 15–41)
Albumin: 2.7 g/dL — ABNORMAL LOW (ref 3.5–5.0)
Alkaline Phosphatase: 71 U/L (ref 38–126)
Anion gap: 8 (ref 5–15)
BUN: 19 mg/dL (ref 8–23)
CO2: 27 mmol/L (ref 22–32)
Calcium: 9.2 mg/dL (ref 8.9–10.3)
Chloride: 102 mmol/L (ref 98–111)
Creatinine, Ser: 0.96 mg/dL (ref 0.44–1.00)
GFR calc Af Amer: >60 mL/min (ref >60)
GFR calc non Af Amer: >60 mL/min (ref >60)
Glucose, Bld: 207 mg/dL — ABNORMAL HIGH (ref 70–99)
Potassium: 4.2 mmol/L (ref 3.5–5.1)
Sodium: 137 mmol/L (ref 135–145)
Total Bilirubin: 0.6 mg/dL (ref 0.3–1.2)
Total Protein: 6.4 g/dL — ABNORMAL LOW (ref 6.5–8.1)

## 2018-06-04 LAB — FERRITIN: Ferritin: 55 ng/mL (ref 11–307)

## 2018-06-04 LAB — IRON AND TIBC
Iron: 23 ug/dL — ABNORMAL LOW (ref 28–170)
Saturation Ratios: 8 % — ABNORMAL LOW (ref 10.4–31.8)
TIBC: 305 ug/dL (ref 250–450)
UIBC: 282 ug/dL

## 2018-06-04 LAB — FOLATE: Folate: 12.4 ng/mL (ref >5.9)

## 2018-06-04 MED ORDER — INSULIN ASPART 100 UNIT/ML ~~LOC~~ SOLN
0.0000 [IU] | Freq: Three times a day (TID) | SUBCUTANEOUS | Status: DC
  Administered 2018-06-04 – 2018-06-05 (×2): 2 [IU] via SUBCUTANEOUS
  Administered 2018-06-05 – 2018-06-06 (×2): 1 [IU] via SUBCUTANEOUS

## 2018-06-04 MED ORDER — INSULIN ASPART 100 UNIT/ML ~~LOC~~ SOLN: 0.0000 [IU] | Freq: Every day | SUBCUTANEOUS | Status: DC

## 2018-06-04 MED ORDER — OXYCODONE HCL 5 MG PO TABS
5.0000 mg | ORAL_TABLET | ORAL | Status: DC | PRN
  Administered 2018-06-04 – 2018-06-05 (×3): 5 mg via ORAL
  Filled 2018-06-04 (×4): qty 1

## 2018-06-04 MED ORDER — MAGNESIUM CITRATE PO SOLN
1.0000 | Freq: Once | ORAL | Status: AC
  Administered 2018-06-04: 1 via ORAL
  Filled 2018-06-04: qty 296

## 2018-06-04 MED ORDER — VANCOMYCIN HCL 1000 MG IV SOLR
1000.0000 mg | INTRAVENOUS | Status: DC
  Administered 2018-06-05 – 2018-06-06 (×3): 1000 mg via INTRAVENOUS
  Filled 2018-06-04 (×4): qty 1000

## 2018-06-04 NOTE — Progress Notes (Signed)
Patient is on NIV at this time already on my arrival. Sleeping comfortably and tolerating it well.

## 2018-06-04 NOTE — Evaluation (Signed)
Physical Therapy Evaluation Patient Details Name: [name] MRN: [id number] DOB: 1950-09-09 Today's Date: 06/04/2018   History of Present Illness  LEZLEY [name] is a 68 y.o. female with history of diabetes mellitus type 2, sleep apnea, hypertension, Graves' disease, chronic and disease, anemia was been experiencing low back pain since May 25, 2018 almost for last 1 week which has been increasing in intensity to the point patient is finding it difficult to walk. Received epidural injetion 05/23/2018. CAT scan showed pancreatic mass and then MRI showed L1-2 discitis with epidural abscess vertral tracking cephalad from T10-11. Pt currenlty no septic.  Clinical Impression  Patient admitted with above. Pt mobility greatly limited by morbid obesity/body habitus and 10/10 back/flank pain. Pt currently requiring min/modA and is home alone during the day. Pt unable to tolerate ambulation this date. Recommending ST-SNF to gain strength and get pain under control to progress to safe mod I level of function for safe transition home.    Follow Up Recommendations SNF;Supervision/Assistance - 24 hour    Equipment Recommendations  (wide RW)    Recommendations for Other Services       Precautions / Restrictions Precautions Precautions: Fall Precaution Comments: morbidly obsese Restrictions Weight Bearing Restrictions: No      Mobility  Bed Mobility Overal bed mobility: Needs Assistance Bed Mobility: Rolling;Sidelying to Sit Rolling: Mod assist Sidelying to sit: Mod assist       General bed mobility comments: modA due to pain, pt initiated all mobility but required modA to complete task, and trunk elevation  Transfers Overall transfer level: Needs assistance Equipment used: Rolling walker (2 wheeled) Transfers: Sit to/from [facility] Sit to Stand: Min assist Stand pivot transfers: Min assist       General transfer comment: increased time, increased pain however pt  able to complete, pt able to advance LEs during std pvt transfer to chair  Ambulation/Gait             General Gait Details: only able to tolerate 5 steps to chair with RW  Stairs            Wheelchair Mobility    Modified Rankin (Stroke Patients Only)       Balance Overall balance assessment: Needs assistance Sitting-balance support: Feet supported;No upper extremity supported Sitting balance-Leahy Scale: Fair     Standing balance support: Bilateral upper extremity supported Standing balance-Leahy Scale: Poor Standing balance comment: requires use of RW                             Pertinent Vitals/Pain Pain Assessment: 0-10 Pain Score: 10-Worst pain ever Pain Location: back and R side Pain Descriptors / Indicators: Sharp Pain Intervention(s): Monitored during session    Home Living Family/patient expects to be discharged to:: Skilled nursing facility                 Additional Comments: pt lives with son and husband who work. 2 STE, 1 story, handicapped bathroom with HH shower head and seat, has RW    Prior Function Level of Independence: Independent         Comments: didn't use AD until 1 week ago     Hand Dominance   Dominant Hand: Right    Extremity/Trunk Assessment   Upper Extremity Assessment Upper Extremity Assessment: Generalized weakness    Lower Extremity Assessment Lower Extremity Assessment: Generalized weakness    Cervical / Trunk Assessment Cervical / Trunk  Assessment: Other exceptions Cervical / Trunk Exceptions: morbidly obsese and 10/10 pain  Communication   Communication: No difficulties  Cognition Arousal/Alertness: Awake/alert Behavior During Therapy: WFL for tasks assessed/performed Overall Cognitive Status: Within Functional Limits for tasks assessed                                        General Comments General comments (skin integrity, edema, etc.): VSS    Exercises      Assessment/Plan    PT Assessment Patient needs continued PT services  PT Problem List Decreased strength;Decreased activity tolerance;Decreased balance;Decreased mobility;Pain;Obesity       PT Treatment Interventions DME instruction;Gait training;Functional mobility training;Therapeutic activities;Therapeutic exercise;Balance training;Neuromuscular re-education    PT Goals (Current goals can be found in the Care Plan section)  Acute Rehab PT Goals Patient Stated Goal: stop the pain PT Goal Formulation: With patient Time For Goal Achievement: 06/18/18 Potential to Achieve Goals: Good    Frequency Min 3X/week   Barriers to discharge Decreased caregiver support home alone alot during the day    Co-evaluation               AM-PAC PT "6 Clicks" Mobility  Outcome Measure Help needed turning from your back to your side while in a flat bed without using bedrails?: A Lot Help needed moving from lying on your back to sitting on the side of a flat bed without using bedrails?: A Lot Help needed moving to and from a bed to a chair (including a wheelchair)?: A Little Help needed standing up from a chair using your arms (e.g., wheelchair or bedside chair)?: A Little Help needed to walk in hospital room?: A Little Help needed climbing 3-5 steps with a railing? : A Lot 6 Click Score: 15    End of Session Equipment Utilized During Treatment: Gait belt Activity Tolerance: Patient limited by pain Patient left: in chair;with call bell/phone within reach Nurse Communication: Mobility status(use BSC) PT Visit Diagnosis: Unsteadiness on feet (R26.81);Pain Pain - Right/Left: (back)    Time: 1007-1219 PT Time Calculation (min) (ACUTE ONLY): 21 min   Charges:   PT Evaluation $PT Eval Moderate Complexity: 1 Mod          Kittie Plater, PT, DPT Acute Rehabilitation Services Pager #: [phone] Office #: [phone]   Berline Lopes 06/04/2018, 2:52 PM

## 2018-06-04 NOTE — Progress Notes (Signed)
Patient ID: [name], female   DOB: Jul 15, 1950, 68 y.o.   MRN: [id number]         [facility]  Date of Admission:  06/02/2018           Day 1 vancomycin ASSESSMENT: She has lumbar discitis complicated by coagulase-negative staph bacteremia.  We will continue vancomycin pending final susceptibility results.  Repeat blood cultures were obtained this morning.  PLAN: 1. Continue vancomycin pending final culture results 2. Hold off on empiric placement  Principal Problem:   Lumbar discitis Active Problems:   Epidural abscess   Coagulase negative Staphylococcus bacteremia   OSA on CPAP   Diabetes mellitus type 2 in obese (HCC)   Graves disease   Essential hypertension   Chronic kidney disease, stage II (mild)   Anemia due to chronic kidney disease   Aortic atherosclerosis (HCC)   Splenomegaly   Scheduled Meds: . acetaminophen  1,000 mg Oral Q8H  . amLODipine  5 mg Oral QHS  . ARIPiprazole  5 mg Oral Daily  . cyclobenzaprine  10 mg Oral QHS  . doxazosin  2 mg Oral QHS  . fenofibrate  54 mg Oral Q supper  . FLUoxetine  60 mg Oral QHS  . gabapentin  100 mg Oral TID  . insulin aspart  0-9 Units Subcutaneous Q4H  . insulin glargine  30 Units Subcutaneous BID  . lidocaine  1 patch Transdermal Q24H  . magnesium oxide  400 mg Oral Q lunch  . metoprolol tartrate  50 mg Oral Q8H  . pantoprazole  40 mg Oral Daily  . polyethylene glycol  17 g Oral BID  . potassium chloride SA  20 mEq Oral Q lunch  . propylthiouracil  75 mg Oral Daily  . rOPINIRole  3 mg Oral QHS  . senna-docusate  2 tablet Oral QHS  . vitamin B-12  1,000 mcg Oral Daily   Continuous Infusions: . vancomycin     PRN Meds:.bisacodyl, clonazePAM, cyclobenzaprine, HYDROmorphone (DILAUDID) injection, Melatonin, ondansetron **OR** ondansetron (ZOFRAN) IV, oxyCODONE   SUBJECTIVE: She states that when she got her lumbar epidural injection in early March her back pain and left sciatica  improved dramatically for about 2 days then she began to have progressively more severe low back pain that was circumferential around both sides.  It became so bad that she had to come to the hospital for admission.  Is not aware of having any fever or chills recently.  She states that her pain was the most severe that she has had in her entire life.  Review of Systems: Review of Systems  Constitutional: Positive for malaise/fatigue. Negative for chills, diaphoresis and fever.  Musculoskeletal: Positive for back pain.  Neurological: Negative for focal weakness.       She notes some numbness from her lower back down into her mid thigh bilaterally    Allergies  Allergen Reactions  . Imitrex [Sumatriptan] Other (See Comments)    Vascular spasms  . Morphine Other (See Comments)    Severe headaches   . Nsaids Other (See Comments)    PT UNABLE TO TOLERATE NSAID'S DRUGS DUE TO HX OF GASTRIC SLEEVE SURGERY  . Codeine Nausea Only  . Promethazine Hcl Other (See Comments)    Restless leg feeling all over body  . Rosuvastatin Calcium Other (See Comments)    Leg/muscle pain  . Statins Other (See Comments)    Leg/muscle pain  . Sulfonamide Derivatives Hives    Childhood allergy   .  Vicodin [Hydrocodone-Acetaminophen] Nausea And Vomiting    OBJECTIVE: Vitals:   06/03/18 2315 06/04/18 0406 06/04/18 0729 06/04/18 1130  BP: (!) 143/49 (!) 131/56 (!) 128/53 (!) 146/45  Pulse: 70 79 73 70  Resp: 18 20 18 18   Temp: 98 F (36.7 C) 97.7 F (36.5 C) 98.4 F (36.9 C) 98.3 F (36.8 C)  TempSrc: Oral  Oral Oral  SpO2: 99% 93% 90% (!) 88%  Weight:      Height:       Body mass index is 45.32 kg/m.  Physical Exam Constitutional:      Comments: She is resting quietly in bed.  Cardiovascular:     Rate and Rhythm: Normal rate and regular rhythm.     Heart sounds: No murmur.  Pulmonary:     Effort: Pulmonary effort is normal.     Breath sounds: Normal breath sounds.  Musculoskeletal:      Comments: She has had right shoulder and right knee replacement and prior septic arthritis of her left knee.  There is no evidence of any joint infections at this time.  Skin:    Findings: No rash.  Psychiatric:        Mood and Affect: Mood normal.     Lab Results Lab Results  Component Value Date   WBC 3.4 (L) 06/04/2018   HGB 9.1 (L) 06/04/2018   HCT 29.8 (L) 06/04/2018   MCV 78.6 (L) 06/04/2018   PLT 204 06/04/2018    Lab Results  Component Value Date   CREATININE 0.96 06/04/2018   BUN 19 06/04/2018   NA 137 06/04/2018   K 4.2 06/04/2018   CL 102 06/04/2018   CO2 27 06/04/2018    Lab Results  Component Value Date   ALT 13 06/04/2018   AST 16 06/04/2018   ALKPHOS 71 06/04/2018   BILITOT 0.6 06/04/2018     Microbiology: Recent Results (from the past 240 hour(s))  Urine culture     Status: None   Collection Time: 05/31/18 10:40 AM  Result Value Ref Range Status   Specimen Description   Final    URINE, CLEAN CATCH Performed at [facility], [place] [address] [place], [place] [address]    Special Requests   Final    NONE Performed at [facility], [place] [address], [place], [place] [address]    Culture   Final    NO GROWTH Performed at [facility], [place] [address] [place], [place] [address]    Report Status 06/01/2018 FINAL  Final  Blood culture (routine x 2)     Status: Abnormal   Collection Time: 06/02/18  7:05 PM  Result Value Ref Range Status   Specimen Description BLOOD RIGHT WRIST  Final   Special Requests   Final    BOTTLES DRAWN AEROBIC AND ANAEROBIC Blood Culture adequate volume   Culture  Setup Time   Final    GRAM POSITIVE COCCI IN CLUSTERS AEROBIC BOTTLE ONLY CRITICAL VALUE NOTED.  VALUE IS CONSISTENT WITH PREVIOUSLY REPORTED AND CALLED VALUE.    Culture (A)  Final    STAPHYLOCOCCUS SPECIES (COAGULASE NEGATIVE) THE SIGNIFICANCE OF ISOLATING THIS ORGANISM FROM A SINGLE SET OF BLOOD CULTURES WHEN  MULTIPLE SETS ARE DRAWN IS UNCERTAIN. PLEASE NOTIFY THE MICROBIOLOGY DEPARTMENT WITHIN ONE WEEK IF SPECIATION AND SENSITIVITIES ARE REQUIRED. Performed at [facility], [place] [address] [place], [place] [address]    Report Status 06/04/2018 FINAL  Final  Blood culture (routine  x 2)     Status: Abnormal (Preliminary result)   Collection Time: 06/02/18  7:05 PM  Result Value Ref Range Status   Specimen Description BLOOD RIGHT FOREARM  Final   Special Requests   Final    BOTTLES DRAWN AEROBIC AND ANAEROBIC Blood Culture adequate volume   Culture  Setup Time   Final    GRAM POSITIVE COCCI IN BOTH AEROBIC AND ANAEROBIC BOTTLES CRITICAL RESULT CALLED TO, READ BACK BY AND VERIFIED WITH: C PIERCE,PHARMD AT 1905 06/03/2018 BY L BENFIELD    Culture (A)  Final    STAPHYLOCOCCUS SPECIES (COAGULASE NEGATIVE) THE SIGNIFICANCE OF ISOLATING THIS ORGANISM FROM A SINGLE SET OF BLOOD CULTURES WHEN MULTIPLE SETS ARE DRAWN IS UNCERTAIN. PLEASE NOTIFY THE MICROBIOLOGY DEPARTMENT WITHIN ONE WEEK IF SPECIATION AND SENSITIVITIES ARE REQUIRED. Performed at [facility], [place] [address] [place], [place] [address]    Report Status PENDING  Incomplete  Blood Culture ID Panel (Reflexed)     Status: Abnormal   Collection Time: 06/02/18  7:05 PM  Result Value Ref Range Status   Enterococcus species NOT DETECTED NOT DETECTED Final   Listeria monocytogenes NOT DETECTED NOT DETECTED Final   Staphylococcus species DETECTED (A) NOT DETECTED Final    Comment: Methicillin (oxacillin) resistant coagulase negative staphylococcus. Possible blood culture contaminant (unless isolated from more than one blood culture draw or clinical case suggests pathogenicity). No antibiotic treatment is indicated for blood  culture contaminants. CRITICAL RESULT CALLED TO, READ BACK BY AND VERIFIED WITH: C PIERCE,PHARMD AT 1905 06/03/2018 BY L BENFIELD    Staphylococcus aureus (BCID) NOT DETECTED NOT DETECTED Final   Methicillin  resistance DETECTED (A) NOT DETECTED Final    Comment: CRITICAL RESULT CALLED TO, READ BACK BY AND VERIFIED WITH: C PIERCE,PHARMD AT 1905 06/03/2018 BY L BENFIELD    Streptococcus species NOT DETECTED NOT DETECTED Final   Streptococcus agalactiae NOT DETECTED NOT DETECTED Final   Streptococcus pneumoniae NOT DETECTED NOT DETECTED Final   Streptococcus pyogenes NOT DETECTED NOT DETECTED Final   Acinetobacter baumannii NOT DETECTED NOT DETECTED Final   Enterobacteriaceae species NOT DETECTED NOT DETECTED Final   Enterobacter cloacae complex NOT DETECTED NOT DETECTED Final   Escherichia coli NOT DETECTED NOT DETECTED Final   Klebsiella oxytoca NOT DETECTED NOT DETECTED Final   Klebsiella pneumoniae NOT DETECTED NOT DETECTED Final   Proteus species NOT DETECTED NOT DETECTED Final   Serratia marcescens NOT DETECTED NOT DETECTED Final   Haemophilus influenzae NOT DETECTED NOT DETECTED Final   Neisseria meningitidis NOT DETECTED NOT DETECTED Final   Pseudomonas aeruginosa NOT DETECTED NOT DETECTED Final   Candida albicans NOT DETECTED NOT DETECTED Final   Candida glabrata NOT DETECTED NOT DETECTED Final   Candida krusei NOT DETECTED NOT DETECTED Final   Candida parapsilosis NOT DETECTED NOT DETECTED Final   Candida tropicalis NOT DETECTED NOT DETECTED Final    Comment: Performed at [facility], [address], [place], [place] [address]  Aerobic/Anaerobic Culture (surgical/deep wound)     Status: None (Preliminary result)   Collection Time: 06/03/18  6:09 PM  Result Value Ref Range Status   Specimen Description WOUND  Final   Special Requests POST ASPIRATION OF L1,L2 DISC SPACE  Final   Gram Stain   Final    FEW WBC PRESENT, PREDOMINANTLY PMN RARE GRAM POSITIVE COCCI IN CLUSTERS Performed at [facility], [place] [address] [place], [place] [address]    Culture PENDING  Incomplete  Report Status PENDING  Incomplete    Michel Bickers, MD [facility] [phone] pager   [phone] cell 06/04/2018, 4:22 PM

## 2018-06-04 NOTE — Progress Notes (Signed)
Pt able to void in bed pan

## 2018-06-04 NOTE — Plan of Care (Signed)
  Problem: Education: Goal: Knowledge of General Education information will improve Description: Including pain rating scale, medication(s)/side effects and non-pharmacologic comfort measures Outcome: Progressing   Problem: Clinical Measurements: Goal: Ability to maintain clinical measurements within normal limits will improve Outcome: Progressing   

## 2018-06-04 NOTE — Progress Notes (Signed)
Patient ID: [name], female   DOB: Sep 17, 1950, 68 y.o.   MRN: [id number] Subjective: Patient reports continued back pain with some radiation into the abdomen and buttocks but no leg pain or numbness tingling or weakness.  Objective: Vital signs in last 24 hours: Temp:  [97.7 F (36.5 C)-99.2 F (37.3 C)] 98.4 F (36.9 C) (03/15 0729) Pulse Rate:  [69-79] 73 (03/15 0729) Resp:  [14-20] 18 (03/15 0729) BP: (112-146)/(44-68) 128/53 (03/15 0729) SpO2:  [89 %-100 %] 90 % (03/15 0729)  Intake/Output from previous day: 03/14 0701 - 03/15 0700 In: 250 [IV Piggyback:250] Out: 825 [Urine:825] Intake/Output this shift: No intake/output data recorded.  Morbidly obese, awake and alert, moves legs well to an in bed exam with 5-5 strength throughout.  Normal sensation.  Abdomen is soft  Lab Results: Lab Results  Component Value Date   WBC 3.4 (L) 06/04/2018   HGB 9.1 (L) 06/04/2018   HCT 29.8 (L) 06/04/2018   MCV 78.6 (L) 06/04/2018   PLT 204 06/04/2018   Lab Results  Component Value Date   INR 1.2 06/03/2018   BMET Lab Results  Component Value Date   NA 137 06/04/2018   K 4.2 06/04/2018   CL 102 06/04/2018   CO2 27 06/04/2018   GLUCOSE 207 (H) 06/04/2018   BUN 19 06/04/2018   CREATININE 0.96 06/04/2018   CALCIUM 9.2 06/04/2018    Studies/Results: Mr Lumbar Spine Wo Contrast  Addendum Date: 06/02/2018   ADDENDUM REPORT: 06/02/2018 18:13 ADDENDUM: Study discussed by telephone with Dr. Gareth Morgan on 06/02/2018 at 1759 hours. Electronically Signed   By: Genevie Ann M.D.   On: 06/02/2018 18:13   Result Date: 06/02/2018 CLINICAL DATA:  68 year old female with back pain. Possible cauda equina syndrome. EXAM: MRI LUMBAR SPINE WITHOUT CONTRAST TECHNIQUE: Multiplanar, multisequence MR imaging of the lumbar spine was performed. No intravenous contrast was administered. COMPARISON:  CT Abdomen and Pelvis earlier today. Lumbar MRI 12/25/2017. FINDINGS: Segmentation: Normal on the CT  today which is the same numbering system used on the 2019 MRI. Alignment: Stable since 2019. Mildly exaggerated lumbar lordosis. Mild grade 1 anterolisthesis of L5 on S1. Mild retrolisthesis at both L1-L2 and L2-L3. Vertebrae: New abnormal decreased T1 marrow signal about the L1-L2 endplates which demonstrate new abnormal increased signal in the disc space on T2 and STIR. See series 11, image 8 and series 9, image 8. Subtle endplate erosion to the right of midline on CT today. Abnormal heterogeneous T2 signal occupying space in the ventral epidural space tracking cephalad from the disc (series 9, image 9 and series 12, image 3). Furthermore, this ventral epidural space abnormality tracks cephalad into the lower thoracic spine best seen on series 9, image 8. Prevertebral fat stranding along the right anterior disc (series 13, image 6). And bilateral medial psoas muscle edema. Other lumbar vertebrae appear stable. No other marrow edema or evidence of acute osseous abnormality. Intact visible sacrum and SI joints. Conus medullaris and cauda equina: Conus extends to the L1 level. No lower spinal cord or conus signal abnormality despite the abnormal ventral epidural space from the lower thoracic spine to L1-L2. Paraspinal and other soft tissues: Abnormal paraspinal inflammation at L1-L2 detailed above. No intramuscular abscess. Stable visible abdominal viscera from the CT today. Disc levels: Abnormal ventral epidural space from T10-T11 through L1-L2 as stated above. At L1-L2 the constellation of abnormal disc and epidural findings results in increased and moderate spinal stenosis which is just below the level of  the conus. Mild to moderate bilateral L1 foraminal stenosis has not significantly changed. L2-L3: Stable moderate to severe spinal, left lateral recess, and left L2 foraminal stenosis related to disc and posterior element degeneration. Other lumbar levels are stable since 2019. IMPRESSION: 1. Acute Discitis  Osteomyelitis at L1-L2 with Ventral Epidural Abscess tracking cephalad from there to the T10-T11 level. Right greater than left mostly ventral paraspinal phlegmon. No paraspinal abscess. 2. Moderate increased Spinal Stenosis related to #1 including at the level of the Conus. No signal abnormality identified in the lower thoracic spinal cord or conus. 3. Elsewhere stable lumbar degenerative changes, including moderate to severe multifactorial spinal, left lateral recess and left foraminal stenosis at L2-L3. Electronically Signed: By: Genevie Ann M.D. On: 06/02/2018 17:55   Ir Fluoro Guide Ndl Plmt / Bx  Result Date: 06/04/2018 INDICATION: Concern for L1-L2 discitis osteomyelitis. Please perform fluoroscopic guided disc aspiration for diagnostic purposes. EXAM: FLUOROSCOPIC GUIDED ASPIRATION OF THE L1-L2 INTERVERTEBRAL DISC COMPARISON:  Lumbar spine MRI-06/02/2018; CT abdomen pelvis - 06/02/2018 MEDICATIONS: None ANESTHESIA/SEDATION: Moderate (conscious) sedation was employed during this procedure. A total of Fentanyl 50 mcg and Versed 1 mg was administered intravenously. Moderate Sedation Time: 10 minutes. The patient's level of consciousness and vital signs were monitored continuously by radiology nursing throughout the procedure under my direct supervision. CONTRAST:  None FLUOROSCOPY TIME:  2 minutes, 48 seconds (90 mGy) COMPLICATIONS: None immediate. PROCEDURE: Informed written consent was obtained from the patient after a discussion of the risks, benefits and alternatives to treatment. A timeout was performed prior to the initiation of the procedure. The patient was positioned prone on the fluoroscopy table. The L1-L2 intervertebral disc space was marked fluoroscopically. Utilizing an oblique, left-sided approach, a 20 gauge Francine needle was a advanced into the targeted intervertebral disc. Appropriate positioning was confirmed with the fluoroscopic imaging in both the anterior and lateral projections.  Multiple spot fluoroscopic images were saved for procedural documentation purposes. With the needle appropriate position, approximately 1 cc of purulent appearing fluid was aspirated from the disc. All fluid was capped and sent to the laboratory for analysis. The needle was removed and superficial hemostasis was achieved with manual compression. A dressing was placed. The patient tolerated the procedure well without immediate postprocedural complication. IMPRESSION: Technically successful fluoroscopic guided L1-L2 disc aspiration. Electronically Signed   By: Sandi Mariscal M.D.   On: 06/04/2018 09:30    Assessment/Plan: L1-2 diskitis with very thin epidural abscess extending up to T10, pre-existing canal stenosis L1-2 L2-3, has methicillin resistant staph epidermidis in her blood cultures.  She is on vancomycin.  Neurologic exam is stable.  She has good strength in her legs.  Normal sensation.  I have spoken to her more about her urinary retention and it certainly sounds pain related.  She simply does not have other signs of cauda equina syndrome and therefore I think it is less likely that her urinary retention is secondary to spinal stenosis and/or the epidural abscess except as that relates to pain.  Continue antibiotics as per infectious disease.  Continue pain control.  Dr. Vertell Limber to reassume her care from a neurosurgical standpoint tomorrow.  Estimated body mass index is 45.32 kg/m as calculated from the following:   Height as of this encounter: 5\' 1"  (1.549 m).   Weight as of this encounter: 108.8 kg.    LOS: 2 days    Eustace Moore 06/04/2018, 9:44 AM

## 2018-06-04 NOTE — Progress Notes (Signed)
PROGRESS NOTE    [name]  KGU:[id number] DOB: Jun 05, 1950 DOA: 06/02/2018 PCP: Crist Infante, MD   Brief Narrative:  ONDA KATTNER is Melony Tenpas 68 y.o. female with history of diabetes mellitus type 2, sleep apnea, hypertension, Graves' disease, chronic and disease, anemia was been experiencing low back pain since May 25, 2018 almost for last 1 week which has been increasing in intensity to the point patient is finding it difficult to walk.  Did not lose function of her extremities or did not have any incontinence of urine or bowel.  Patient had Andrika Peraza epidural injection on May 23, 2018.  2 days later patient symptoms started coming.  Also had abdominal pain with some nausea.  Had come to the ER 3 days ago had CAT scan done which showed pancreatic mass and was advised to follow-up as outpatient.  Since pain worsened with difficulty walking now patient came back to the ER again.  Patient states he gets subjective feeling of fever and chills over the last couple of days.  Assessment & Plan:   Principal Problem:   Lumbar discitis Active Problems:   OSA on CPAP   Diabetes mellitus type 2 in obese (HCC)   Graves disease   Essential hypertension   Chronic kidney disease, stage II (mild)   Anemia due to chronic kidney disease   Epidural abscess   Aortic atherosclerosis (HCC)   Splenomegaly   Coagulase negative Staphylococcus bacteremia   1. Lumbar discitis osteomyelitis at L1-L2   Ventral Epidural Abscess   Right > Left Paraspinal Phlegmon:  1. Vancomycin 3/14 - Present 2. blood cx 2/2 from 3/14 with coag negative staph - follow repeat blood cultures 3. S/p disc space aspiration by IR on 3/14 4. Aerobic/anaerobic cx from aspiration with rare gram positive cocci in clusters 5. Appreciate neurosurgery recs - no indication for surgery at this time 6. Appreciate ID recs 7. Scheduled APAP, oxycodone prn, prn dilaudid, lidocaine patch 8. PT for mobilization  2. Coag Negative Staph Bacteremia: 2/2  cultures with coag negative staph.  Follow aerobic/anaerobic culture from aspiration for comparison.  Follow speciation/sensitivities. 1. Follow repeat blood cx from 3/15 2. Follow echo 3. Appreciate ID recommendations 4. Continue vancomycin  3. Urinary Retention: discussed with neurosurgery, low suspicion that this is due to epidural abscess.  Suspected 2/2 pain and lack of mobilization.  Constipation also could be contributing.   1. Follow bladder scans, I/O cath prn.  2. Foley ordered this morning given recurrent I/O cath's, will try to remove as soon as possible  4. Constipation: large stool burden on imaging.  Bowel regimen.   5. Pancreatic mass: lobulated appearing mass of pancreatic head, concerning for mucinous neoplasm (vs pancreatic adenocarcinoma) 1. Needs follow up MRI/MRCP or pancreatic protocol CT when able  6. Diabetes mellitus type 2  1. Lantus 30 units BID 2. SSI ACHS  7. History of Graves' disease on propylthiouracil.  Follow CBC closely.  8. Chronic kidney disease stage II creatinine appears to be at baseline.  9. Anemia, microcytic 1. Iron def anemia.  Normal B12 and folate. 2. Hold off on starting iron until constipation improved 3. Ensure routine CRC screening    10. Sleep apnea on CPAP.  11. Hypertension on metoprolol amlodipine and Cardura.  12. Restless leg syndrome on Requip.  13. Depression continue fluoxetine.  14. Anxiety on clonazepam.  DVT prophylaxis: SCD Code Status: full  Family Communication: none at bedside Disposition Plan: pending further improvement  Consultants:   IR  ID  Neurosurgery  Procedures:   none  Antimicrobials: Anti-infectives (From admission, onward)   Start     Dose/Rate Route Frequency Ordered Stop   06/04/18 1900  vancomycin (VANCOCIN) IVPB 1000 mg/200 mL premix     1,000 mg 200 mL/hr over 60 Minutes Intravenous Every 24 hours 06/03/18 1908     06/03/18 1915  vancomycin (VANCOCIN) 2,000 mg in sodium  chloride 0.9 % 500 mL IVPB     2,000 mg 250 mL/hr over 120 Minutes Intravenous  Once 06/03/18 1908 06/03/18 2254      Subjective: C/o worsened back pain after procedure. Notes dilaudid works for about 2 hours.  Objective: Vitals:   06/03/18 2315 06/04/18 0406 06/04/18 0729 06/04/18 1130  BP: (!) 143/49 (!) 131/56 (!) 128/53 (!) 146/45  Pulse: 70 79 73 70  Resp: 18 20 18 18   Temp: 98 F (36.7 C) 97.7 F (36.5 C) 98.4 F (36.9 C) 98.3 F (36.8 C)  TempSrc: Oral  Oral Oral  SpO2: 99% 93% 90% (!) 88%  Weight:      Height:        Intake/Output Summary (Last 24 hours) at 06/04/2018 1615 Last data filed at 06/04/2018 0500 Gross per 24 hour  Intake 250 ml  Output 825 ml  Net -575 ml   Filed Weights   06/02/18 1745  Weight: 108.8 kg    Examination:  General: No acute distress. Cardiovascular: Heart sounds show Tatanisha Cuthbert regular rate, and rhythm. Lungs: Clear to auscultation bilaterally  Abdomen: Soft, nontender, nondistended Neurological: Alert and oriented 3. Moves all extremities 4. Cranial nerves II through XII grossly intact. MSK: unable to turn for me to examine back due to pain Extremities: No clubbing or cyanosis. No edema. Marland Kitchen Psychiatric: Mood and affect are normal. Insight and judgment are appropriate.   Data Reviewed: I have personally reviewed following labs and imaging studies  CBC: Recent Labs  Lab 05/31/18 1117 06/02/18 0704 06/03/18 0338 06/04/18 0444  WBC 3.8* 3.8* 4.5 3.4*  NEUTROABS  --  2.6  --   --   HGB 9.5* 8.2* 9.1* 9.1*  HCT 31.7* 28.1* 29.2* 29.8*  MCV 79.8* 81.7 78.7* 78.6*  PLT 196 208 217 940   Basic Metabolic Panel: Recent Labs  Lab 05/31/18 1117 06/02/18 0704 06/03/18 0338 06/04/18 0444  NA 136 136 135 137  K 4.2 4.2 4.2 4.2  CL 99 98 98 102  CO2 27 28 27 27   GLUCOSE 129* 139* 167* 207*  BUN 36* 26* 16 19  CREATININE 1.16* 1.08* 1.02* 0.96  CALCIUM 9.3 9.2 9.6 9.2  MG  --   --   --  1.9   GFR: Estimated Creatinine  Clearance: 65.7 mL/min (by C-G formula based on SCr of 0.96 mg/dL). Liver Function Tests: Recent Labs  Lab 05/31/18 1117 06/02/18 0704 06/04/18 0444  AST 17 15 16   ALT 14 13 13   ALKPHOS 78 77 71  BILITOT 0.5 0.5 0.6  PROT 7.7 6.9 6.4*  ALBUMIN 3.7 3.1* 2.7*   Recent Labs  Lab 05/31/18 1117 06/02/18 0704  LIPASE 23 22   No results for input(s): AMMONIA in the last 168 hours. Coagulation Profile: Recent Labs  Lab 06/03/18 0852  INR 1.2   Cardiac Enzymes: No results for input(s): CKTOTAL, CKMB, CKMBINDEX, TROPONINI in the last 168 hours. BNP (last 3 results) No results for input(s): PROBNP in the last 8760 hours. HbA1C: No results for input(s): HGBA1C in the last 72 hours. CBG: Recent Labs  Lab 06/03/18 1657 06/03/18 2135 06/04/18 0606 06/04/18 0750 06/04/18 1208  GLUCAP 144* 284* 170* 152* 171*   Lipid Profile: No results for input(s): CHOL, HDL, LDLCALC, TRIG, CHOLHDL, LDLDIRECT in the last 72 hours. Thyroid Function Tests: No results for input(s): TSH, T4TOTAL, FREET4, T3FREE, THYROIDAB in the last 72 hours. Anemia Panel: Recent Labs    06/04/18 0444  VITAMINB12 3,000*  FOLATE 12.4  FERRITIN 55  TIBC 305  IRON 23*   Sepsis Labs: No results for input(s): PROCALCITON, LATICACIDVEN in the last 168 hours.  Recent Results (from the past 240 hour(s))  Urine culture     Status: None   Collection Time: 05/31/18 10:40 AM  Result Value Ref Range Status   Specimen Description   Final    URINE, CLEAN CATCH Performed at [facility], [place] [address], [place], [place] [address]    Special Requests   Final    NONE Performed at [facility], [place] [address] [place], [place] [address]    Culture   Final    NO GROWTH Performed at [facility], [place] [address], [place], [place] [address]    Report Status 06/01/2018 FINAL  Final  Blood culture (routine x 2)     Status: Abnormal   Collection Time: 06/02/18  7:05 PM    Result Value Ref Range Status   Specimen Description BLOOD RIGHT WRIST  Final   Special Requests   Final    BOTTLES DRAWN AEROBIC AND ANAEROBIC Blood Culture adequate volume   Culture  Setup Time   Final    GRAM POSITIVE COCCI IN CLUSTERS AEROBIC BOTTLE ONLY CRITICAL VALUE NOTED.  VALUE IS CONSISTENT WITH PREVIOUSLY REPORTED AND CALLED VALUE.    Culture (Nazier Neyhart)  Final    STAPHYLOCOCCUS SPECIES (COAGULASE NEGATIVE) THE SIGNIFICANCE OF ISOLATING THIS ORGANISM FROM Tong Pieczynski SINGLE SET OF BLOOD CULTURES WHEN MULTIPLE SETS ARE DRAWN IS UNCERTAIN. PLEASE NOTIFY THE MICROBIOLOGY DEPARTMENT WITHIN ONE WEEK IF SPECIATION AND SENSITIVITIES ARE REQUIRED. Performed at [facility], [place] [address] [place], [place] [address]    Report Status 06/04/2018 FINAL  Final  Blood culture (routine x 2)     Status: Abnormal (Preliminary result)   Collection Time: 06/02/18  7:05 PM  Result Value Ref Range Status   Specimen Description BLOOD RIGHT FOREARM  Final   Special Requests   Final    BOTTLES DRAWN AEROBIC AND ANAEROBIC Blood Culture adequate volume   Culture  Setup Time   Final    GRAM POSITIVE COCCI IN BOTH AEROBIC AND ANAEROBIC BOTTLES CRITICAL RESULT CALLED TO, READ BACK BY AND VERIFIED WITH: C PIERCE,PHARMD AT 1905 06/03/2018 BY L BENFIELD    Culture (Kalli Greenfield)  Final    STAPHYLOCOCCUS SPECIES (COAGULASE NEGATIVE) THE SIGNIFICANCE OF ISOLATING THIS ORGANISM FROM Ledarrius Beauchaine SINGLE SET OF BLOOD CULTURES WHEN MULTIPLE SETS ARE DRAWN IS UNCERTAIN. PLEASE NOTIFY THE MICROBIOLOGY DEPARTMENT WITHIN ONE WEEK IF SPECIATION AND SENSITIVITIES ARE REQUIRED. Performed at [facility], [place] [address] [place], [place] [address]    Report Status PENDING  Incomplete  Blood Culture ID Panel (Reflexed)     Status: Abnormal   Collection Time: 06/02/18  7:05 PM  Result Value Ref Range Status   Enterococcus species NOT DETECTED NOT DETECTED Final   Listeria monocytogenes NOT DETECTED NOT DETECTED Final   Staphylococcus species  DETECTED (Pacer Dorn) NOT DETECTED Final    Comment: Methicillin (oxacillin) resistant coagulase negative staphylococcus. Possible blood culture contaminant (unless  isolated from more than one blood culture draw or clinical case suggests pathogenicity). No antibiotic treatment is indicated for blood  culture contaminants. CRITICAL RESULT CALLED TO, READ BACK BY AND VERIFIED WITH: C PIERCE,PHARMD AT 1905 06/03/2018 BY L BENFIELD    Staphylococcus aureus (BCID) NOT DETECTED NOT DETECTED Final   Methicillin resistance DETECTED (Hisayo Delossantos) NOT DETECTED Final    Comment: CRITICAL RESULT CALLED TO, READ BACK BY AND VERIFIED WITH: C PIERCE,PHARMD AT 1905 06/03/2018 BY L BENFIELD    Streptococcus species NOT DETECTED NOT DETECTED Final   Streptococcus agalactiae NOT DETECTED NOT DETECTED Final   Streptococcus pneumoniae NOT DETECTED NOT DETECTED Final   Streptococcus pyogenes NOT DETECTED NOT DETECTED Final   Acinetobacter baumannii NOT DETECTED NOT DETECTED Final   Enterobacteriaceae species NOT DETECTED NOT DETECTED Final   Enterobacter cloacae complex NOT DETECTED NOT DETECTED Final   Escherichia coli NOT DETECTED NOT DETECTED Final   Klebsiella oxytoca NOT DETECTED NOT DETECTED Final   Klebsiella pneumoniae NOT DETECTED NOT DETECTED Final   Proteus species NOT DETECTED NOT DETECTED Final   Serratia marcescens NOT DETECTED NOT DETECTED Final   Haemophilus influenzae NOT DETECTED NOT DETECTED Final   Neisseria meningitidis NOT DETECTED NOT DETECTED Final   Pseudomonas aeruginosa NOT DETECTED NOT DETECTED Final   Candida albicans NOT DETECTED NOT DETECTED Final   Candida glabrata NOT DETECTED NOT DETECTED Final   Candida krusei NOT DETECTED NOT DETECTED Final   Candida parapsilosis NOT DETECTED NOT DETECTED Final   Candida tropicalis NOT DETECTED NOT DETECTED Final    Comment: Performed at [facility], [address], [place], [place] [address]  Aerobic/Anaerobic Culture (surgical/deep wound)      Status: None (Preliminary result)   Collection Time: 06/03/18  6:09 PM  Result Value Ref Range Status   Specimen Description WOUND  Final   Special Requests POST ASPIRATION OF L1,L2 DISC SPACE  Final   Gram Stain   Final    FEW WBC PRESENT, PREDOMINANTLY PMN RARE GRAM POSITIVE COCCI IN CLUSTERS Performed at [facility], [place] [address] [place], [place] [address]    Culture PENDING  Incomplete   Report Status PENDING  Incomplete         Radiology Studies: Mr Lumbar Spine Wo Contrast  Addendum Date: 06/02/2018   ADDENDUM REPORT: 06/02/2018 18:13 ADDENDUM: Study discussed by telephone with Dr. Gareth Morgan on 06/02/2018 at 1759 hours. Electronically Signed   By: Genevie Ann M.D.   On: 06/02/2018 18:13   Result Date: 06/02/2018 CLINICAL DATA:  68 year old female with back pain. Possible cauda equina syndrome. EXAM: MRI LUMBAR SPINE WITHOUT CONTRAST TECHNIQUE: Multiplanar, multisequence MR imaging of the lumbar spine was performed. No intravenous contrast was administered. COMPARISON:  CT Abdomen and Pelvis earlier today. Lumbar MRI 12/25/2017. FINDINGS: Segmentation: Normal on the CT today which is the same numbering system used on the 2019 MRI. Alignment: Stable since 2019. Mildly exaggerated lumbar lordosis. Mild grade 1 anterolisthesis of L5 on S1. Mild retrolisthesis at both L1-L2 and L2-L3. Vertebrae: New abnormal decreased T1 marrow signal about the L1-L2 endplates which demonstrate new abnormal increased signal in the disc space on T2 and STIR. See series 11, image 8 and series 9, image 8. Subtle endplate erosion to the right of midline on CT today. Abnormal heterogeneous T2 signal occupying space in the ventral epidural space tracking cephalad from the disc (series 9, image 9 and series 12, image 3). Furthermore, this ventral epidural space abnormality tracks cephalad  into the lower thoracic spine best seen on series 9, image 8. Prevertebral fat stranding along the right anterior disc  (series 13, image 6). And bilateral medial psoas muscle edema. Other lumbar vertebrae appear stable. No other marrow edema or evidence of acute osseous abnormality. Intact visible sacrum and SI joints. Conus medullaris and cauda equina: Conus extends to the L1 level. No lower spinal cord or conus signal abnormality despite the abnormal ventral epidural space from the lower thoracic spine to L1-L2. Paraspinal and other soft tissues: Abnormal paraspinal inflammation at L1-L2 detailed above. No intramuscular abscess. Stable visible abdominal viscera from the CT today. Disc levels: Abnormal ventral epidural space from T10-T11 through L1-L2 as stated above. At L1-L2 the constellation of abnormal disc and epidural findings results in increased and moderate spinal stenosis which is just below the level of the conus. Mild to moderate bilateral L1 foraminal stenosis has not significantly changed. L2-L3: Stable moderate to severe spinal, left lateral recess, and left L2 foraminal stenosis related to disc and posterior element degeneration. Other lumbar levels are stable since 2019. IMPRESSION: 1. Acute Discitis Osteomyelitis at L1-L2 with Ventral Epidural Abscess tracking cephalad from there to the T10-T11 level. Right greater than left mostly ventral paraspinal phlegmon. No paraspinal abscess. 2. Moderate increased Spinal Stenosis related to #1 including at the level of the Conus. No signal abnormality identified in the lower thoracic spinal cord or conus. 3. Elsewhere stable lumbar degenerative changes, including moderate to severe multifactorial spinal, left lateral recess and left foraminal stenosis at L2-L3. Electronically Signed: By: Genevie Ann M.D. On: 06/02/2018 17:55   Ir Fluoro Guide Ndl Plmt / Bx  Result Date: 06/04/2018 INDICATION: Concern for L1-L2 discitis osteomyelitis. Please perform fluoroscopic guided disc aspiration for diagnostic purposes. EXAM: FLUOROSCOPIC GUIDED ASPIRATION OF THE L1-L2 INTERVERTEBRAL  DISC COMPARISON:  Lumbar spine MRI-06/02/2018; CT abdomen pelvis - 06/02/2018 MEDICATIONS: None ANESTHESIA/SEDATION: Moderate (conscious) sedation was employed during this procedure. Jaeden Westbay total of Fentanyl 50 mcg and Versed 1 mg was administered intravenously. Moderate Sedation Time: 10 minutes. The patient's level of consciousness and vital signs were monitored continuously by radiology nursing throughout the procedure under my direct supervision. CONTRAST:  None FLUOROSCOPY TIME:  2 minutes, 48 seconds (90 mGy) COMPLICATIONS: None immediate. PROCEDURE: Informed written consent was obtained from the patient after Ressie Slevin discussion of the risks, benefits and alternatives to treatment. Abran Gavigan timeout was performed prior to the initiation of the procedure. The patient was positioned prone on the fluoroscopy table. The L1-L2 intervertebral disc space was marked fluoroscopically. Utilizing an oblique, left-sided approach, Shermeka Rutt 20 gauge Francine needle was Gwenn Teodoro advanced into the targeted intervertebral disc. Appropriate positioning was confirmed with the fluoroscopic imaging in both the anterior and lateral projections. Multiple spot fluoroscopic images were saved for procedural documentation purposes. With the needle appropriate position, approximately 1 cc of purulent appearing fluid was aspirated from the disc. All fluid was capped and sent to the laboratory for analysis. The needle was removed and superficial hemostasis was achieved with manual compression. Glenisha Gundry dressing was placed. The patient tolerated the procedure well without immediate postprocedural complication. IMPRESSION: Technically successful fluoroscopic guided L1-L2 disc aspiration. Electronically Signed   By: Sandi Mariscal M.D.   On: 06/04/2018 09:30        Scheduled Meds:  acetaminophen  1,000 mg Oral Q8H   amLODipine  5 mg Oral QHS   ARIPiprazole  5 mg Oral Daily   cyclobenzaprine  10 mg Oral QHS   doxazosin  2 mg Oral  QHS   fenofibrate  54 mg Oral Q  supper   FLUoxetine  60 mg Oral QHS   gabapentin  100 mg Oral TID   insulin aspart  0-9 Units Subcutaneous Q4H   insulin glargine  30 Units Subcutaneous BID   lidocaine  1 patch Transdermal Q24H   magnesium oxide  400 mg Oral Q lunch   metoprolol tartrate  50 mg Oral Q8H   pantoprazole  40 mg Oral Daily   polyethylene glycol  17 g Oral BID   potassium chloride SA  20 mEq Oral Q lunch   propylthiouracil  75 mg Oral Daily   rOPINIRole  3 mg Oral QHS   senna-docusate  2 tablet Oral QHS   vitamin B-12  1,000 mcg Oral Daily   Continuous Infusions:  vancomycin       LOS: 2 days    Time spent: over 30 min    Fayrene Helper, MD [facility] Pager AMION  If 7PM-7AM, please contact night-coverage [email or web address] Password [facility] 06/04/2018, 4:15 PM

## 2018-06-05 ENCOUNTER — Inpatient Hospital Stay (HOSPITAL_COMMUNITY): Payer: Medicare Other

## 2018-06-05 DIAGNOSIS — R7881 Bacteremia: Secondary | ICD-10-CM

## 2018-06-05 LAB — CBC
HCT: 28.7 % — ABNORMAL LOW (ref 36.0–46.0)
Hemoglobin: 8.6 g/dL — ABNORMAL LOW (ref 12.0–15.0)
MCH: 23.4 pg — ABNORMAL LOW (ref 26.0–34.0)
MCHC: 30 g/dL (ref 30.0–36.0)
MCV: 78 fL — ABNORMAL LOW (ref 80.0–100.0)
Platelets: 205 10*3/uL (ref 150–400)
RBC: 3.68 MIL/uL — ABNORMAL LOW (ref 3.87–5.11)
RDW: 15.2 % (ref 11.5–15.5)
WBC: 3.4 10*3/uL — ABNORMAL LOW (ref 4.0–10.5)
nRBC: 0 % (ref 0.0–0.2)

## 2018-06-05 LAB — GLUCOSE, CAPILLARY
Glucose-Capillary: 134 mg/dL — ABNORMAL HIGH (ref 70–99)
Glucose-Capillary: 171 mg/dL — ABNORMAL HIGH (ref 70–99)
Glucose-Capillary: 104 mg/dL — ABNORMAL HIGH (ref 70–99)
Glucose-Capillary: 170 mg/dL — ABNORMAL HIGH (ref 70–99)

## 2018-06-05 LAB — CULTURE, BLOOD (ROUTINE X 2): Special Requests: ADEQUATE

## 2018-06-05 LAB — URINALYSIS, ROUTINE W REFLEX MICROSCOPIC
Bilirubin Urine: NEGATIVE
Glucose, UA: NEGATIVE mg/dL
Ketones, ur: NEGATIVE mg/dL
Nitrite: NEGATIVE
Protein, ur: 30 mg/dL — AB
RBC / HPF: >50 RBC/hpf — ABNORMAL HIGH (ref 0–5)
Specific Gravity, Urine: 1.016 (ref 1.005–1.030)
WBC, UA: >50 WBC/hpf — ABNORMAL HIGH (ref 0–5)
pH: 7 (ref 5.0–8.0)

## 2018-06-05 LAB — COMPREHENSIVE METABOLIC PANEL
ALT: 14 U/L (ref 0–44)
AST: 12 U/L — ABNORMAL LOW (ref 15–41)
Albumin: 2.5 g/dL — ABNORMAL LOW (ref 3.5–5.0)
Alkaline Phosphatase: 71 U/L (ref 38–126)
Anion gap: 7 (ref 5–15)
BUN: 20 mg/dL (ref 8–23)
CO2: 28 mmol/L (ref 22–32)
Calcium: 9.3 mg/dL (ref 8.9–10.3)
Chloride: 100 mmol/L (ref 98–111)
Creatinine, Ser: 0.97 mg/dL (ref 0.44–1.00)
GFR calc Af Amer: >60 mL/min (ref >60)
GFR calc non Af Amer: >60 mL/min (ref >60)
Glucose, Bld: 148 mg/dL — ABNORMAL HIGH (ref 70–99)
Potassium: 4 mmol/L (ref 3.5–5.1)
Sodium: 135 mmol/L (ref 135–145)
Total Bilirubin: 0.3 mg/dL (ref 0.3–1.2)
Total Protein: 6.1 g/dL — ABNORMAL LOW (ref 6.5–8.1)

## 2018-06-05 LAB — ECHOCARDIOGRAM COMPLETE
Height: 61 in
Weight: 3837.77 oz

## 2018-06-05 LAB — C-REACTIVE PROTEIN: CRP: 15.5 mg/dL — ABNORMAL HIGH (ref <1.0)

## 2018-06-05 LAB — SEDIMENTATION RATE: Sed Rate: 102 mm/hr — ABNORMAL HIGH (ref 0–22)

## 2018-06-05 LAB — MAGNESIUM: Magnesium: 1.9 mg/dL (ref 1.7–2.4)

## 2018-06-05 MED ORDER — PERFLUTREN LIPID MICROSPHERE
1.0000 mL | INTRAVENOUS | Status: AC | PRN
  Administered 2018-06-05: 3 mL via INTRAVENOUS
  Filled 2018-06-05: qty 10

## 2018-06-05 MED ORDER — OXYCODONE HCL 5 MG PO TABS
10.0000 mg | ORAL_TABLET | ORAL | Status: DC | PRN
  Administered 2018-06-05 – 2018-06-07 (×9): 10 mg via ORAL
  Filled 2018-06-05 (×9): qty 2

## 2018-06-05 MED ORDER — OXYCODONE HCL 5 MG PO TABS: 5.0000 mg | ORAL_TABLET | ORAL | Status: DC | PRN

## 2018-06-05 MED ORDER — SODIUM CHLORIDE 0.9 % IV SOLN
INTRAVENOUS | Status: DC | PRN
  Administered 2018-06-05: 500 mL via INTRAVENOUS

## 2018-06-05 NOTE — Progress Notes (Signed)
  Echocardiogram 2D Echocardiogram with definity has been performed.  [name] M 06/05/2018, 2:31 PM

## 2018-06-05 NOTE — Progress Notes (Signed)
Patient ID: [name], female   DOB: Jun 16, 1950, 68 y.o.   MRN: [id number]         [facility]  Date of Admission:  06/02/2018           Day 2 vancomycin ASSESSMENT: She has lumbar discitis complicated by a small epidural abscess and coagulase-negative staph bacteremia.  Antibiotic susceptibilities and repeat blood cultures are pending.  PLAN: 1. Continue vancomycin pending final culture results 2. Hold off on PICC placement until repeat blood cultures are negative  Principal Problem:   Lumbar discitis Active Problems:   Epidural abscess   Coagulase negative Staphylococcus bacteremia   Pancreatic mass   OSA on CPAP   Diabetes mellitus type 2 in obese (HCC)   Graves disease   Essential hypertension   Chronic kidney disease, stage II (mild)   Anemia due to chronic kidney disease   Aortic atherosclerosis (HCC)   Splenomegaly   Scheduled Meds: . acetaminophen  1,000 mg Oral Q8H  . amLODipine  5 mg Oral QHS  . ARIPiprazole  5 mg Oral Daily  . cyclobenzaprine  10 mg Oral QHS  . doxazosin  2 mg Oral QHS  . fenofibrate  54 mg Oral Q supper  . FLUoxetine  60 mg Oral QHS  . gabapentin  100 mg Oral TID  . insulin aspart  0-5 Units Subcutaneous QHS  . insulin aspart  0-9 Units Subcutaneous TID WC  . insulin glargine  30 Units Subcutaneous BID  . lidocaine  1 patch Transdermal Q24H  . magnesium oxide  400 mg Oral Q lunch  . metoprolol tartrate  50 mg Oral Q8H  . pantoprazole  40 mg Oral Daily  . polyethylene glycol  17 g Oral BID  . potassium chloride SA  20 mEq Oral Q lunch  . propylthiouracil  75 mg Oral Daily  . rOPINIRole  3 mg Oral QHS  . senna-docusate  2 tablet Oral QHS  . vitamin B-12  1,000 mcg Oral Daily   Continuous Infusions: . vancomycin 1,000 mg (06/05/18 0000)   PRN Meds:.bisacodyl, clonazePAM, cyclobenzaprine, HYDROmorphone (DILAUDID) injection, Melatonin, ondansetron **OR** ondansetron (ZOFRAN) IV, oxyCODONE **OR**  oxyCODONE, perflutren lipid microspheres (DEFINITY) IV suspension   SUBJECTIVE: She is feeling a little bit better today.  Her pain is under better control.  She has not been out of bed.  Review of Systems: Review of Systems  Constitutional: Negative for fever.  Musculoskeletal: Positive for back pain.    Allergies  Allergen Reactions  . Imitrex [Sumatriptan] Other (See Comments)    Vascular spasms  . Morphine Other (See Comments)    Severe headaches   . Nsaids Other (See Comments)    PT UNABLE TO TOLERATE NSAID'S DRUGS DUE TO HX OF GASTRIC SLEEVE SURGERY  . Codeine Nausea Only  . Promethazine Hcl Other (See Comments)    Restless leg feeling all over body  . Rosuvastatin Calcium Other (See Comments)    Leg/muscle pain  . Statins Other (See Comments)    Leg/muscle pain  . Sulfonamide Derivatives Hives    Childhood allergy   . Vicodin [Hydrocodone-Acetaminophen] Nausea And Vomiting    OBJECTIVE: Vitals:   06/05/18 0324 06/05/18 0526 06/05/18 0927 06/05/18 1408  BP: (!) 109/51 (!) 134/56 (!) 119/43 (!) 149/51  Pulse: 70 74 68 76  Resp:   17 16  Temp: 99.1 F (37.3 C)   97.7 F (36.5 C)  TempSrc: Oral   Oral  SpO2: 93% 97% 99%  98%  Weight:      Height:       Body mass index is 45.32 kg/m.  Physical Exam Constitutional:      Comments: She is in good spirits.  She appears more comfortable.  Cardiovascular:     Rate and Rhythm: Normal rate and regular rhythm.     Heart sounds: No murmur.  Pulmonary:     Effort: Pulmonary effort is normal.     Breath sounds: Normal breath sounds.  Psychiatric:        Mood and Affect: Mood normal.     Lab Results Lab Results  Component Value Date   WBC 3.4 (L) 06/05/2018   HGB 8.6 (L) 06/05/2018   HCT 28.7 (L) 06/05/2018   MCV 78.0 (L) 06/05/2018   PLT 205 06/05/2018    Lab Results  Component Value Date   CREATININE 0.97 06/05/2018   BUN 20 06/05/2018   NA 135 06/05/2018   K 4.0 06/05/2018   CL 100 06/05/2018    CO2 28 06/05/2018    Lab Results  Component Value Date   ALT 14 06/05/2018   AST 12 (L) 06/05/2018   ALKPHOS 71 06/05/2018   BILITOT 0.3 06/05/2018     Microbiology: Recent Results (from the past 240 hour(s))  Urine culture     Status: None   Collection Time: 05/31/18 10:40 AM  Result Value Ref Range Status   Specimen Description   Final    URINE, CLEAN CATCH Performed at [facility], [place] [address] [place], [place] [address]    Special Requests   Final    NONE Performed at [facility], [place] [address] [place], [place] [address]    Culture   Final    NO GROWTH Performed at [facility], [place] [address] [place], [place] [address]    Report Status 06/01/2018 FINAL  Final  Blood culture (routine x 2)     Status: Abnormal   Collection Time: 06/02/18  7:05 PM  Result Value Ref Range Status   Specimen Description BLOOD RIGHT WRIST  Final   Special Requests   Final    BOTTLES DRAWN AEROBIC AND ANAEROBIC Blood Culture adequate volume   Culture  Setup Time   Final    GRAM POSITIVE COCCI IN CLUSTERS AEROBIC BOTTLE ONLY CRITICAL VALUE NOTED.  VALUE IS CONSISTENT WITH PREVIOUSLY REPORTED AND CALLED VALUE. Performed at [facility], [place] [address], [place], [place] [address]    Culture STAPHYLOCOCCUS EPIDERMIDIS (A)  Final   Report Status 06/04/2018 FINAL  Final  Blood culture (routine x 2)     Status: Abnormal   Collection Time: 06/02/18  7:05 PM  Result Value Ref Range Status   Specimen Description BLOOD RIGHT FOREARM  Final   Special Requests   Final    BOTTLES DRAWN AEROBIC AND ANAEROBIC Blood Culture adequate volume   Culture  Setup Time   Final    GRAM POSITIVE COCCI IN BOTH AEROBIC AND ANAEROBIC BOTTLES CRITICAL RESULT CALLED TO, READ BACK BY AND VERIFIED WITH: C PIERCE,PHARMD AT 1905 06/03/2018 BY L BENFIELD Performed at [facility], [place] [address], [place], [place] [address]    Culture STAPHYLOCOCCUS  EPIDERMIDIS (A)  Final   Report Status 06/05/2018 FINAL  Final   Organism ID, Bacteria STAPHYLOCOCCUS EPIDERMIDIS  Final      Susceptibility   Staphylococcus epidermidis - MIC*    CIPROFLOXACIN >=8 RESISTANT Resistant  ERYTHROMYCIN >=8 RESISTANT Resistant     GENTAMICIN <=0.5 SENSITIVE Sensitive     OXACILLIN >=4 RESISTANT Resistant     TETRACYCLINE 2 SENSITIVE Sensitive     VANCOMYCIN 1 SENSITIVE Sensitive     TRIMETH/SULFA 80 RESISTANT Resistant     CLINDAMYCIN >=8 RESISTANT Resistant     RIFAMPIN <=0.5 SENSITIVE Sensitive     Inducible Clindamycin NEGATIVE Sensitive     * STAPHYLOCOCCUS EPIDERMIDIS  Blood Culture ID Panel (Reflexed)     Status: Abnormal   Collection Time: 06/02/18  7:05 PM  Result Value Ref Range Status   Enterococcus species NOT DETECTED NOT DETECTED Final   Listeria monocytogenes NOT DETECTED NOT DETECTED Final   Staphylococcus species DETECTED (A) NOT DETECTED Final    Comment: Methicillin (oxacillin) resistant coagulase negative staphylococcus. Possible blood culture contaminant (unless isolated from more than one blood culture draw or clinical case suggests pathogenicity). No antibiotic treatment is indicated for blood  culture contaminants. CRITICAL RESULT CALLED TO, READ BACK BY AND VERIFIED WITH: C PIERCE,PHARMD AT 1905 06/03/2018 BY L BENFIELD    Staphylococcus aureus (BCID) NOT DETECTED NOT DETECTED Final   Methicillin resistance DETECTED (A) NOT DETECTED Final    Comment: CRITICAL RESULT CALLED TO, READ BACK BY AND VERIFIED WITH: C PIERCE,PHARMD AT 1905 06/03/2018 BY L BENFIELD    Streptococcus species NOT DETECTED NOT DETECTED Final   Streptococcus agalactiae NOT DETECTED NOT DETECTED Final   Streptococcus pneumoniae NOT DETECTED NOT DETECTED Final   Streptococcus pyogenes NOT DETECTED NOT DETECTED Final   Acinetobacter baumannii NOT DETECTED NOT DETECTED Final   Enterobacteriaceae species NOT DETECTED NOT DETECTED Final   Enterobacter cloacae  complex NOT DETECTED NOT DETECTED Final   Escherichia coli NOT DETECTED NOT DETECTED Final   Klebsiella oxytoca NOT DETECTED NOT DETECTED Final   Klebsiella pneumoniae NOT DETECTED NOT DETECTED Final   Proteus species NOT DETECTED NOT DETECTED Final   Serratia marcescens NOT DETECTED NOT DETECTED Final   Haemophilus influenzae NOT DETECTED NOT DETECTED Final   Neisseria meningitidis NOT DETECTED NOT DETECTED Final   Pseudomonas aeruginosa NOT DETECTED NOT DETECTED Final   Candida albicans NOT DETECTED NOT DETECTED Final   Candida glabrata NOT DETECTED NOT DETECTED Final   Candida krusei NOT DETECTED NOT DETECTED Final   Candida parapsilosis NOT DETECTED NOT DETECTED Final   Candida tropicalis NOT DETECTED NOT DETECTED Final    Comment: Performed at [facility], [address], [place], [place] [address]  Aerobic/Anaerobic Culture (surgical/deep wound)     Status: None (Preliminary result)   Collection Time: 06/03/18  6:09 PM  Result Value Ref Range Status   Specimen Description WOUND  Final   Special Requests POST ASPIRATION OF L1,L2 DISC SPACE  Final   Gram Stain   Final    FEW WBC PRESENT, PREDOMINANTLY PMN RARE GRAM POSITIVE COCCI IN CLUSTERS    Culture   Final    ABUNDANT STAPHYLOCOCCUS EPIDERMIDIS SUSCEPTIBILITIES TO FOLLOW Performed at [facility], [place] [address] [place], [place] [address]    Report Status PENDING  Incomplete  Culture, blood (routine x 2)     Status: None (Preliminary result)   Collection Time: 06/04/18  8:40 AM  Result Value Ref Range Status   Specimen Description BLOOD LEFT HAND  Final   Special Requests   Final    BOTTLES DRAWN AEROBIC AND ANAEROBIC Blood Culture adequate volume   Culture   Final    NO GROWTH < 24 HOURS  Performed at [facility], [place] [address] [place], [place] [address]    Report Status PENDING  Incomplete  Culture, blood (routine x 2)     Status: None (Preliminary result)   Collection Time: 06/04/18  8:40 AM   Result Value Ref Range Status   Specimen Description BLOOD SITE NOT SPECIFIED  Final   Special Requests   Final    BOTTLES DRAWN AEROBIC AND ANAEROBIC Blood Culture adequate volume   Culture   Final    NO GROWTH < 24 HOURS Performed at [facility], [place] [address], [place], [place] [address]    Report Status PENDING  Incomplete    Michel Bickers, MD [facility] for [facility]ious [place] [phone] pager   [phone] cell 06/05/2018, 2:21 PM

## 2018-06-05 NOTE — Progress Notes (Signed)
PROGRESS NOTE    [name]  ZOX:[id number] DOB: [birth year] DOA: 06/02/2018 PCP: Crist Infante, MD   Brief Narrative:  [name] is [name] 68 y.o. female with history of diabetes mellitus type 2, sleep apnea, hypertension, Graves' disease, chronic and disease, anemia was been experiencing low back pain since May 25, 2018 almost for last 1 week which has been increasing in intensity to the point patient is finding it difficult to walk.  Did not lose function of her extremities or did not have any incontinence of urine or bowel.  Patient had [name] epidural injection on May 23, 2018.  2 days later patient symptoms started coming.  Also had abdominal pain with some nausea.  Had come to the ER 3 days ago had CAT scan done which showed pancreatic mass and was advised to follow-up as outpatient.  Since pain worsened with difficulty walking now patient came back to the ER again.  Patient states he gets subjective feeling of fever and chills over the last couple of days.  Assessment & Plan:   Principal Problem:   Lumbar discitis Active Problems:   OSA on CPAP   Diabetes mellitus type 2 in obese (HCC)   Graves disease   Essential hypertension   Chronic kidney disease, stage II (mild)   Anemia due to chronic kidney disease   Epidural abscess   Aortic atherosclerosis (HCC)   Splenomegaly   Coagulase negative Staphylococcus bacteremia   Pancreatic mass   1. Lumbar discitis osteomyelitis at L1-L2   Ventral Epidural Abscess   Right > Left Paraspinal Phlegmon:  1. Vancomycin 3/14 - Present 2. blood cx 2/2 from 3/14 with oxacillin resistant staph epidermidis - follow repeat blood cultures from 3/15 3. S/p disc space aspiration by IR on 3/14 4. Aerobic/anaerobic cx from aspiration with rare gram positive cocci in cluster 5. Appreciate neurosurgery recs - no indication for surgery at this time 6. Appreciate ID recs 7. Scheduled APAP, oxycodone prn, prn dilaudid for pain not relieved by oxy, lidocaine  patch 8. PT for mobilization  2. Oxacillin Resistant Staph Epidermidis: 2/2 cultures with coag negative staph.  Follow aerobic/anaerobic culture from aspiration for comparison.  Follow speciation/sensitivities. 1. Follow repeat blood cx from 3/15 pending 2. Follow echo pending 3. Appreciate ID recommendations 4. Continue vancomycin (3/14 - )  3. Urinary Retention: discussed with neurosurgery, low suspicion that this is due to epidural abscess.  Suspected 2/2 pain and lack of mobilization.  Constipation also could be contributing.   1. Follow bladder scans, I/O cath prn.  2. She's been able to use bed pan   4. Constipation: large stool burden on imaging.  Bowel regimen.   5. Pancreatic mass: lobulated appearing mass of pancreatic head, concerning for mucinous neoplasm (vs pancreatic adenocarcinoma) 1. Needs follow up MRI/MRCP or pancreatic protocol CT when able  6. Diabetes mellitus type 2  1. Lantus 30 units BID 2. SSI ACHS  7. History of Graves' disease on propylthiouracil.  Follow CBC closely.  8. Chronic kidney disease stage II creatinine appears to be at baseline.  9. Anemia, microcytic 1. Iron def anemia.  Normal B12 and folate. 2. Hold off on starting iron until constipation improved 3. Ensure routine CRC screening    10. Sleep apnea on CPAP.  11. Hypertension on metoprolol amlodipine and Cardura.  12. Restless leg syndrome on Requip.  13. Depression continue fluoxetine.  14. Anxiety on clonazepam.  DVT prophylaxis: SCD Code Status: full  Family Communication: husband at bedside  Disposition Plan: pending further improvement  Consultants:   IR  ID  Neurosurgery  Procedures:   none  Antimicrobials: Anti-infectives (From admission, onward)   Start     Dose/Rate Route Frequency Ordered Stop   06/04/18 2215  vancomycin (VANCOCIN) 1,000 mg in sodium chloride 0.9 % 250 mL IVPB     1,000 mg 250 mL/hr over 60 Minutes Intravenous Every 24 hours 06/04/18 2214      06/04/18 1900  vancomycin (VANCOCIN) IVPB 1000 mg/200 mL premix  Status:  Discontinued     1,000 mg 200 mL/hr over 60 Minutes Intravenous Every 24 hours 06/03/18 1908 06/04/18 2213   06/03/18 1915  vancomycin (VANCOCIN) 2,000 mg in sodium chloride 0.9 % 500 mL IVPB     2,000 mg 250 mL/hr over 120 Minutes Intravenous  Once 06/03/18 1908 06/03/18 2254      Subjective: Pain improved today, but still present. Using bathroom with bedpan.  Objective: Vitals:   06/04/18 1656 06/05/18 0324 06/05/18 0526 06/05/18 0927  BP: (!) 143/58 (!) 109/51 (!) 134/56 (!) 119/43  Pulse: 74 70 74 68  Resp: 16   17  Temp: 98.7 F (37.1 C) 99.1 F (37.3 C)    TempSrc: Oral Oral    SpO2: 93% 93% 97% 99%  Weight:      Height:        Intake/Output Summary (Last 24 hours) at 06/05/2018 1310 Last data filed at 06/05/2018 1028 Gross per 24 hour  Intake --  Output 1325 ml  Net -1325 ml   Filed Weights   06/02/18 1745  Weight: 108.8 kg    Examination:  General: No acute distress. Cardiovascular: Heart sounds show [name] regular rate, and rhythm. Lungs: Clear to auscultation bilaterally. Abdomen: Soft, nontender, nondistended Neurological: Alert and oriented 3. Moves all extremities 4. Cranial nerves II through XII grossly intact. Skin: Warm and dry. No rashes or lesions. Extremities: No clubbing or cyanosis. No edema.  Psychiatric: Mood and affect are normal. Insight and judgment are appropriate.    Data Reviewed: I have personally reviewed following labs and imaging studies  CBC: Recent Labs  Lab 05/31/18 1117 06/02/18 0704 06/03/18 0338 06/04/18 0444 06/05/18 0608  WBC 3.8* 3.8* 4.5 3.4* 3.4*  NEUTROABS  --  2.6  --   --   --   HGB 9.5* 8.2* 9.1* 9.1* 8.6*  HCT 31.7* 28.1* 29.2* 29.8* 28.7*  MCV 79.8* 81.7 78.7* 78.6* 78.0*  PLT 196 208 217 204 591   Basic Metabolic Panel: Recent Labs  Lab 05/31/18 1117 06/02/18 0704 06/03/18 0338 06/04/18 0444 06/05/18 0608  NA 136 136  135 137 135  K 4.2 4.2 4.2 4.2 4.0  CL 99 98 98 102 100  CO2 27 28 27 27 28   GLUCOSE 129* 139* 167* 207* 148*  BUN 36* 26* 16 19 20   CREATININE 1.16* 1.08* 1.02* 0.96 0.97  CALCIUM 9.3 9.2 9.6 9.2 9.3  MG  --   --   --  1.9 1.9   GFR: Estimated Creatinine Clearance: 65 mL/min (by C-G formula based on SCr of 0.97 mg/dL). Liver Function Tests: Recent Labs  Lab 05/31/18 1117 06/02/18 0704 06/04/18 0444 06/05/18 0608  AST 17 15 16  12*  ALT 14 13 13 14   ALKPHOS 78 77 71 71  BILITOT 0.5 0.5 0.6 0.3  PROT 7.7 6.9 6.4* 6.1*  ALBUMIN 3.7 3.1* 2.7* 2.5*   Recent Labs  Lab 05/31/18 1117 06/02/18 0704  LIPASE 23 22   No results  for input(s): AMMONIA in the last 168 hours. Coagulation Profile: Recent Labs  Lab 06/03/18 0852  INR 1.2   Cardiac Enzymes: No results for input(s): CKTOTAL, CKMB, CKMBINDEX, TROPONINI in the last 168 hours. BNP (last 3 results) No results for input(s): PROBNP in the last 8760 hours. HbA1C: No results for input(s): HGBA1C in the last 72 hours. CBG: Recent Labs  Lab 06/04/18 1208 06/04/18 1650 06/04/18 2208 06/05/18 0729 06/05/18 1143  GLUCAP 171* 173* 133* 134* 171*   Lipid Profile: No results for input(s): CHOL, HDL, LDLCALC, TRIG, CHOLHDL, LDLDIRECT in the last 72 hours. Thyroid Function Tests: No results for input(s): TSH, T4TOTAL, FREET4, T3FREE, THYROIDAB in the last 72 hours. Anemia Panel: Recent Labs    06/04/18 0444  VITAMINB12 3,000*  FOLATE 12.4  FERRITIN 55  TIBC 305  IRON 23*   Sepsis Labs: No results for input(s): PROCALCITON, LATICACIDVEN in the last 168 hours.  Recent Results (from the past 240 hour(s))  Urine culture     Status: None   Collection Time: 05/31/18 10:40 AM  Result Value Ref Range Status   Specimen Description   Final    URINE, CLEAN CATCH Performed at [facility], [place] [address] [place], [place] [address]    Special Requests   Final    NONE Performed at [facility], [place] [address], [place], [place] [address]    Culture   Final    NO GROWTH Performed at [facility], [place] [address], [place], [place] [address]    Report Status 06/01/2018 FINAL  Final  Blood culture (routine x 2)     Status: Abnormal   Collection Time: 06/02/18  7:05 PM  Result Value Ref Range Status   Specimen Description BLOOD RIGHT WRIST  Final   Special Requests   Final    BOTTLES DRAWN AEROBIC AND ANAEROBIC Blood Culture adequate volume   Culture  Setup Time   Final    GRAM POSITIVE COCCI IN CLUSTERS AEROBIC BOTTLE ONLY CRITICAL VALUE NOTED.  VALUE IS CONSISTENT WITH PREVIOUSLY REPORTED AND CALLED VALUE. Performed at [facility], [place] [address], [place], [place] [address]    Culture STAPHYLOCOCCUS EPIDERMIDIS ([name])  Final   Report Status 06/04/2018 FINAL  Final  Blood culture (routine x 2)     Status: Abnormal   Collection Time: 06/02/18  7:05 PM  Result Value Ref Range Status   Specimen Description BLOOD RIGHT FOREARM  Final   Special Requests   Final    BOTTLES DRAWN AEROBIC AND ANAEROBIC Blood Culture adequate volume   Culture  Setup Time   Final    GRAM POSITIVE COCCI IN BOTH AEROBIC AND ANAEROBIC BOTTLES CRITICAL RESULT CALLED TO, READ BACK BY AND VERIFIED WITH: C PIERCE,PHARMD AT 1905 06/03/2018 BY L BENFIELD Performed at [facility], [place] [address], [place], [place] [address]    Culture STAPHYLOCOCCUS EPIDERMIDIS ([name])  Final   Report Status 06/05/2018 FINAL  Final   Organism ID, Bacteria STAPHYLOCOCCUS EPIDERMIDIS  Final      Susceptibility   Staphylococcus epidermidis - MIC*    CIPROFLOXACIN >=8 RESISTANT Resistant     ERYTHROMYCIN >=8 RESISTANT Resistant     GENTAMICIN <=0.5 SENSITIVE Sensitive     OXACILLIN >=4 RESISTANT Resistant     TETRACYCLINE 2 SENSITIVE Sensitive     VANCOMYCIN 1 SENSITIVE Sensitive     TRIMETH/SULFA 80 RESISTANT Resistant     CLINDAMYCIN >=8 RESISTANT Resistant  RIFAMPIN <=0.5 SENSITIVE  Sensitive     Inducible Clindamycin NEGATIVE Sensitive     * STAPHYLOCOCCUS EPIDERMIDIS  Blood Culture ID Panel (Reflexed)     Status: Abnormal   Collection Time: 06/02/18  7:05 PM  Result Value Ref Range Status   Enterococcus species NOT DETECTED NOT DETECTED Final   Listeria monocytogenes NOT DETECTED NOT DETECTED Final   Staphylococcus species DETECTED ([name]) NOT DETECTED Final    Comment: Methicillin (oxacillin) resistant coagulase negative staphylococcus. Possible blood culture contaminant (unless isolated from more than one blood culture draw or clinical case suggests pathogenicity). No antibiotic treatment is indicated for blood  culture contaminants. CRITICAL RESULT CALLED TO, READ BACK BY AND VERIFIED WITH: C PIERCE,PHARMD AT 1905 06/03/2018 BY L BENFIELD    Staphylococcus aureus (BCID) NOT DETECTED NOT DETECTED Final   Methicillin resistance DETECTED ([name]) NOT DETECTED Final    Comment: CRITICAL RESULT CALLED TO, READ BACK BY AND VERIFIED WITH: C PIERCE,PHARMD AT 1905 06/03/2018 BY L BENFIELD    Streptococcus species NOT DETECTED NOT DETECTED Final   Streptococcus agalactiae NOT DETECTED NOT DETECTED Final   Streptococcus pneumoniae NOT DETECTED NOT DETECTED Final   Streptococcus pyogenes NOT DETECTED NOT DETECTED Final   Acinetobacter baumannii NOT DETECTED NOT DETECTED Final   Enterobacteriaceae species NOT DETECTED NOT DETECTED Final   Enterobacter cloacae complex NOT DETECTED NOT DETECTED Final   Escherichia coli NOT DETECTED NOT DETECTED Final   Klebsiella oxytoca NOT DETECTED NOT DETECTED Final   Klebsiella pneumoniae NOT DETECTED NOT DETECTED Final   Proteus species NOT DETECTED NOT DETECTED Final   Serratia marcescens NOT DETECTED NOT DETECTED Final   Haemophilus influenzae NOT DETECTED NOT DETECTED Final   Neisseria meningitidis NOT DETECTED NOT DETECTED Final   Pseudomonas aeruginosa NOT DETECTED NOT DETECTED Final   Candida albicans NOT DETECTED NOT DETECTED Final    Candida glabrata NOT DETECTED NOT DETECTED Final   Candida krusei NOT DETECTED NOT DETECTED Final   Candida parapsilosis NOT DETECTED NOT DETECTED Final   Candida tropicalis NOT DETECTED NOT DETECTED Final    Comment: Performed at [facility], [address], [place], [place] [address]  Aerobic/Anaerobic Culture (surgical/deep wound)     Status: None (Preliminary result)   Collection Time: 06/03/18  6:09 PM  Result Value Ref Range Status   Specimen Description WOUND  Final   Special Requests POST ASPIRATION OF L1,L2 DISC SPACE  Final   Gram Stain   Final    FEW WBC PRESENT, PREDOMINANTLY PMN RARE GRAM POSITIVE COCCI IN CLUSTERS    Culture   Final    CULTURE REINCUBATED FOR BETTER GROWTH Performed at [facility], [place] [address] [place], [place] [address]    Report Status PENDING  Incomplete  Culture, blood (routine x 2)     Status: None (Preliminary result)   Collection Time: 06/04/18  8:40 AM  Result Value Ref Range Status   Specimen Description BLOOD LEFT HAND  Final   Special Requests   Final    BOTTLES DRAWN AEROBIC AND ANAEROBIC Blood Culture adequate volume   Culture   Final    NO GROWTH < 24 HOURS Performed at [facility], [place] [address] [place],  [address]    Report Status PENDING  Incomplete  Culture, blood (routine x 2)     Status: None (Preliminary result)   Collection Time: 06/04/18  8:40 AM  Result Value Ref Range Status   Specimen Description BLOOD SITE NOT  SPECIFIED  Final   Special Requests   Final    BOTTLES DRAWN AEROBIC AND ANAEROBIC Blood Culture adequate volume   Culture   Final    NO GROWTH < 24 HOURS Performed at [facility], [address], [place], [place] [address]    Report Status PENDING  Incomplete         Radiology Studies: Ir Fluoro Guide Ndl Plmt / Bx  Result Date: 06/04/2018 INDICATION: Concern for L1-L2 discitis osteomyelitis. Please perform fluoroscopic guided disc aspiration for diagnostic  purposes. EXAM: FLUOROSCOPIC GUIDED ASPIRATION OF THE L1-L2 INTERVERTEBRAL DISC COMPARISON:  Lumbar spine MRI-06/02/2018; CT abdomen pelvis - 06/02/2018 MEDICATIONS: None ANESTHESIA/SEDATION: Moderate (conscious) sedation was employed during this procedure. [name] total of Fentanyl 50 mcg and Versed 1 mg was administered intravenously. Moderate Sedation Time: 10 minutes. The patient's level of consciousness and vital signs were monitored continuously by radiology nursing throughout the procedure under my direct supervision. CONTRAST:  None FLUOROSCOPY TIME:  2 minutes, 48 seconds (90 mGy) COMPLICATIONS: None immediate. PROCEDURE: Informed written consent was obtained from the patient after [name] discussion of the risks, benefits and alternatives to treatment. [name] timeout was performed prior to the initiation of the procedure. The patient was positioned prone on the fluoroscopy table. The L1-L2 intervertebral disc space was marked fluoroscopically. Utilizing an oblique, left-sided approach, [name] 20 gauge Francine needle was [name] advanced into the targeted intervertebral disc. Appropriate positioning was confirmed with the fluoroscopic imaging in both the anterior and lateral projections. Multiple spot fluoroscopic images were saved for procedural documentation purposes. With the needle appropriate position, approximately 1 cc of purulent appearing fluid was aspirated from the disc. All fluid was capped and sent to the laboratory for analysis. The needle was removed and superficial hemostasis was achieved with manual compression. Ketih Goodie dressing was placed. The patient tolerated the procedure well without immediate postprocedural complication. IMPRESSION: Technically successful fluoroscopic guided L1-L2 disc aspiration. Electronically Signed   By: [name] M.D.   On: 06/04/2018 09:30        Scheduled Meds:  acetaminophen  1,000 mg Oral Q8H   amLODipine  5 mg Oral QHS   ARIPiprazole  5 mg Oral Daily   cyclobenzaprine  10  mg Oral QHS   doxazosin  2 mg Oral QHS   fenofibrate  54 mg Oral Q supper   FLUoxetine  60 mg Oral QHS   gabapentin  100 mg Oral TID   insulin aspart  0-5 Units Subcutaneous QHS   insulin aspart  0-9 Units Subcutaneous TID WC   insulin glargine  30 Units Subcutaneous BID   lidocaine  1 patch Transdermal Q24H   magnesium oxide  400 mg Oral Q lunch   metoprolol tartrate  50 mg Oral Q8H   pantoprazole  40 mg Oral Daily   polyethylene glycol  17 g Oral BID   potassium chloride SA  20 mEq Oral Q lunch   propylthiouracil  75 mg Oral Daily   rOPINIRole  3 mg Oral QHS   senna-docusate  2 tablet Oral QHS   vitamin B-12  1,000 mcg Oral Daily   Continuous Infusions:  vancomycin 1,000 mg (06/05/18 0000)     LOS: 3 days    Time spent: over 30 min    Fayrene Helper, MD [facility] Pager AMION  If 7PM-7AM, please contact night-coverage [email or web address] Password [email or web address] 06/05/2018, 1:10 PM
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
## 2018-06-05 NOTE — Progress Notes (Signed)
Patient is on NIV at this time tolerating it well.  
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
## 2018-06-05 NOTE — Care Management Important Message (Signed)
Important Message  Patient Details  Name: [name] MRN: [id number] Date of Birth: 1950-10-02   Medicare Important Message Given:  Yes    Tahmid Stonehocker [address] 06/05/2018, 3:57 PM

## 2018-06-06 ENCOUNTER — Inpatient Hospital Stay: Payer: Self-pay

## 2018-06-06 LAB — COMPREHENSIVE METABOLIC PANEL
ALT: 13 U/L (ref 0–44)
AST: 12 U/L — ABNORMAL LOW (ref 15–41)
Albumin: 2.6 g/dL — ABNORMAL LOW (ref 3.5–5.0)
Alkaline Phosphatase: 72 U/L (ref 38–126)
Anion gap: 11 (ref 5–15)
BUN: 18 mg/dL (ref 8–23)
CO2: 29 mmol/L (ref 22–32)
Calcium: 9.8 mg/dL (ref 8.9–10.3)
Chloride: 100 mmol/L (ref 98–111)
Creatinine, Ser: 1.1 mg/dL — ABNORMAL HIGH (ref 0.44–1.00)
GFR calc Af Amer: >60 mL/min (ref >60)
GFR calc non Af Amer: 52 mL/min — ABNORMAL LOW (ref >60)
Glucose, Bld: 111 mg/dL — ABNORMAL HIGH (ref 70–99)
Potassium: 4.7 mmol/L (ref 3.5–5.1)
Sodium: 140 mmol/L (ref 135–145)
Total Bilirubin: 0.4 mg/dL (ref 0.3–1.2)
Total Protein: 6.7 g/dL (ref 6.5–8.1)

## 2018-06-06 LAB — GLUCOSE, CAPILLARY
Glucose-Capillary: 87 mg/dL (ref 70–99)
Glucose-Capillary: 136 mg/dL — ABNORMAL HIGH (ref 70–99)
Glucose-Capillary: 72 mg/dL (ref 70–99)
Glucose-Capillary: 95 mg/dL (ref 70–99)

## 2018-06-06 LAB — CBC
HCT: 29.7 % — ABNORMAL LOW (ref 36.0–46.0)
Hemoglobin: 8.7 g/dL — ABNORMAL LOW (ref 12.0–15.0)
MCH: 23.1 pg — ABNORMAL LOW (ref 26.0–34.0)
MCHC: 29.3 g/dL — ABNORMAL LOW (ref 30.0–36.0)
MCV: 78.8 fL — ABNORMAL LOW (ref 80.0–100.0)
Platelets: 204 10*3/uL (ref 150–400)
RBC: 3.77 MIL/uL — ABNORMAL LOW (ref 3.87–5.11)
RDW: 15.4 % (ref 11.5–15.5)
WBC: 3.8 10*3/uL — ABNORMAL LOW (ref 4.0–10.5)
nRBC: 0 % (ref 0.0–0.2)

## 2018-06-06 LAB — MAGNESIUM: Magnesium: 2 mg/dL (ref 1.7–2.4)

## 2018-06-06 MED ORDER — NITROFURANTOIN MONOHYD MACRO 100 MG PO CAPS: 100.0000 mg | ORAL_CAPSULE | Freq: Two times a day (BID) | ORAL | 0 refills | Status: AC

## 2018-06-06 MED ORDER — SORBITOL 70 % SOLN
960.0000 mL | TOPICAL_OIL | Freq: Once | ORAL | Status: DC | PRN
  Filled 2018-06-06: qty 473

## 2018-06-06 MED ORDER — HYDROMORPHONE HCL 1 MG/ML IJ SOLN: 1.0000 mg | INTRAMUSCULAR | Status: DC | PRN

## 2018-06-06 MED ORDER — OXYCODONE HCL 5 MG PO TABS: 5.0000 mg | ORAL_TABLET | ORAL | 0 refills | Status: AC | PRN

## 2018-06-06 MED ORDER — POLYETHYLENE GLYCOL 3350 17 G PO PACK: 17.0000 g | PACK | Freq: Two times a day (BID) | ORAL | 0 refills | Status: AC

## 2018-06-06 MED ORDER — VANCOMYCIN IV (FOR PTA / DISCHARGE USE ONLY): 1000.0000 mg | INTRAVENOUS | 0 refills | Status: DC

## 2018-06-06 MED ORDER — NITROFURANTOIN MONOHYD MACRO 100 MG PO CAPS
100.0000 mg | ORAL_CAPSULE | Freq: Two times a day (BID) | ORAL | Status: DC
  Administered 2018-06-06 – 2018-06-07 (×2): 100 mg via ORAL
  Filled 2018-06-06 (×2): qty 1

## 2018-06-06 MED ORDER — SODIUM CHLORIDE 0.9% FLUSH
10.0000 mL | Freq: Two times a day (BID) | INTRAVENOUS | Status: DC
  Administered 2018-06-06 – 2018-06-07 (×2): 10 mL

## 2018-06-06 MED ORDER — OXYCODONE HCL 5 MG PO TABS
5.0000 mg | ORAL_TABLET | Freq: Once | ORAL | Status: AC
  Administered 2018-06-06: 5 mg via ORAL
  Filled 2018-06-06: qty 1

## 2018-06-06 MED ORDER — NITROFURANTOIN MONOHYD MACRO 100 MG PO CAPS: 100.0000 mg | ORAL_CAPSULE | Freq: Two times a day (BID) | ORAL | Status: DC

## 2018-06-06 MED ORDER — SODIUM CHLORIDE 0.9% FLUSH: 10.0000 mL | INTRAVENOUS | Status: DC | PRN

## 2018-06-06 NOTE — Progress Notes (Signed)
Patient ID: [name], female   DOB: [birth year], 68 y.o.   MRN: [id number]         [facility]  Date of Admission:  06/02/2018           Day 3 vancomycin ASSESSMENT: She has lumbar discitis complicated by a small epidural abscess and methicillin-resistant coagulase-negative staph bacteremia.  She is improving and repeat blood cultures are negative at 48 hours.  A PICC line can be placed now.  PLAN: 1. Continue vancomycin  2. PICC placement 3. I have arranged follow-up in my clinic 4. I will sign off now  Diagnosis: Lumbar discitis  Culture Result: Methicillin-resistant coagulase-negative staph  Allergies  Allergen Reactions  . Imitrex [Sumatriptan] Other (See Comments)    Vascular spasms  . Morphine Other (See Comments)    Severe headaches   . Nsaids Other (See Comments)    PT UNABLE TO TOLERATE NSAID'S DRUGS DUE TO HX OF GASTRIC SLEEVE SURGERY  . Codeine Nausea Only  . Promethazine Hcl Other (See Comments)    Restless leg feeling all over body  . Rosuvastatin Calcium Other (See Comments)    Leg/muscle pain  . Statins Other (See Comments)    Leg/muscle pain  . Sulfonamide Derivatives Hives    Childhood allergy   . Vicodin [Hydrocodone-Acetaminophen] Nausea And Vomiting    OPAT Orders Discharge antibiotics: Per pharmacy protocol vancomycin Aim for Vancomycin trough 15-20 (unless otherwise indicated) Duration: 6 weeks End Date: July 16, 2018  [facility] Care Per Protocol:  Labs weekly while on IV antibiotics: _x_ CBC with differential _x_ BMP __ CMP _x_ CRP _x_ ESR _x_ Vancomycin trough __ CK  _x_ Please pull PIC at completion of IV antibiotics __ Please leave PIC in place until doctor has seen patient or been notified  Fax weekly labs to [phone]  Clinic Follow Up Appt: 07/12/2018 at 9:30 AM  Principal Problem:   Lumbar discitis Active Problems:   Epidural abscess   Coagulase negative Staphylococcus bacteremia   Pancreatic mass   OSA on CPAP   Diabetes mellitus type 2 in obese (HCC)   Graves disease   Essential hypertension   Chronic kidney disease, stage II (mild)   Anemia due to chronic kidney disease   Aortic atherosclerosis (HCC)   Splenomegaly   Scheduled Meds: . acetaminophen  1,000 mg Oral Q8H  . amLODipine  5 mg Oral QHS  . ARIPiprazole  5 mg Oral Daily  . cyclobenzaprine  10 mg Oral QHS  . doxazosin  2 mg Oral QHS  . fenofibrate  54 mg Oral Q supper  . FLUoxetine  60 mg Oral QHS  . gabapentin  100 mg Oral TID  . insulin aspart  0-5 Units Subcutaneous QHS  . insulin aspart  0-9 Units Subcutaneous TID WC  . insulin glargine  30 Units Subcutaneous BID  . lidocaine  1 patch Transdermal Q24H  . magnesium oxide  400 mg Oral Q lunch  . metoprolol tartrate  50 mg Oral Q8H  . pantoprazole  40 mg Oral Daily  . polyethylene glycol  17 g Oral BID  . potassium chloride SA  20 mEq Oral Q lunch  . propylthiouracil  75 mg Oral Daily  . rOPINIRole  3 mg Oral QHS  . senna-docusate  2 tablet Oral QHS  . vitamin B-12  1,000 mcg Oral Daily   Continuous Infusions: . sodium chloride 10 mL/hr at 06/05/18 2245  . vancomycin Stopped (06/05/18 2245)  PRN Meds:.sodium chloride, bisacodyl, clonazePAM, cyclobenzaprine, Melatonin, ondansetron **OR** ondansetron (ZOFRAN) IV, oxyCODONE **OR** oxyCODONE, sorbitol, milk of mag, mineral oil, glycerin (SMOG) enema   SUBJECTIVE: She is feeling much better today.  She is still having back pain that she rates 8 out of 10 but she was able to get up and walk to the bathroom on her own without much difficulty.  She has not worked with PT yet.  Review of Systems: Review of Systems  Constitutional: Negative for fever.  Musculoskeletal: Positive for back pain.    Allergies  Allergen Reactions  . Imitrex [Sumatriptan] Other (See Comments)    Vascular spasms  . Morphine Other (See Comments)    Severe headaches   . Nsaids Other (See Comments)    PT  UNABLE TO TOLERATE NSAID'S DRUGS DUE TO HX OF GASTRIC SLEEVE SURGERY  . Codeine Nausea Only  . Promethazine Hcl Other (See Comments)    Restless leg feeling all over body  . Rosuvastatin Calcium Other (See Comments)    Leg/muscle pain  . Statins Other (See Comments)    Leg/muscle pain  . Sulfonamide Derivatives Hives    Childhood allergy   . Vicodin [Hydrocodone-Acetaminophen] Nausea And Vomiting    OBJECTIVE: Vitals:   06/06/18 0336 06/06/18 0735 06/06/18 1131 06/06/18 1348  BP: (!) 117/48 (!) 122/44 (!) 121/46 (!) 129/59  Pulse: 73 74 76   Resp: '16 16 18   ' Temp: 98.2 F (36.8 C) 98.2 F (36.8 C) 97.9 F (36.6 C)   TempSrc: Oral Oral Oral   SpO2: 93% 91% 93%   Weight:      Height:       Body mass index is 45.32 kg/m.  Physical Exam Constitutional:      Comments: She is in good spirits.   Cardiovascular:     Rate and Rhythm: Normal rate and regular rhythm.     Heart sounds: No murmur.  Pulmonary:     Effort: Pulmonary effort is normal.     Breath sounds: Normal breath sounds.  Psychiatric:        Mood and Affect: Mood normal.     Lab Results Lab Results  Component Value Date   WBC 3.8 (L) 06/06/2018   HGB 8.7 (L) 06/06/2018   HCT 29.7 (L) 06/06/2018   MCV 78.8 (L) 06/06/2018   PLT 204 06/06/2018    Lab Results  Component Value Date   CREATININE 1.10 (H) 06/06/2018   BUN 18 06/06/2018   NA 140 06/06/2018   K 4.7 06/06/2018   CL 100 06/06/2018   CO2 29 06/06/2018    Lab Results  Component Value Date   ALT 13 06/06/2018   AST 12 (L) 06/06/2018   ALKPHOS 72 06/06/2018   BILITOT 0.4 06/06/2018     Microbiology: Recent Results (from the past 240 hour(s))  Urine culture     Status: None   Collection Time: 05/31/18 10:40 AM  Result Value Ref Range Status   Specimen Description   Final    URINE, CLEAN CATCH Performed at [facility], [place] [address] [place], [place] [address]    Special Requests   Final    NONE Performed at  [facility], [place] [address], [place], [place] [address]    Culture   Final    NO GROWTH Performed at [facility], [place] [address], [place], [place] [address]    Report Status 06/01/2018 FINAL  Final  Blood culture (routine x 2)  Status: Abnormal   Collection Time: 06/02/18  7:05 PM  Result Value Ref Range Status   Specimen Description BLOOD RIGHT WRIST  Final   Special Requests   Final    BOTTLES DRAWN AEROBIC AND ANAEROBIC Blood Culture adequate volume   Culture  Setup Time   Final    GRAM POSITIVE COCCI IN CLUSTERS AEROBIC BOTTLE ONLY CRITICAL VALUE NOTED.  VALUE IS CONSISTENT WITH PREVIOUSLY REPORTED AND CALLED VALUE. Performed at [facility], [place] [address], [place], [place] [address]    Culture STAPHYLOCOCCUS EPIDERMIDIS (A)  Final   Report Status 06/04/2018 FINAL  Final  Blood culture (routine x 2)     Status: Abnormal   Collection Time: 06/02/18  7:05 PM  Result Value Ref Range Status   Specimen Description BLOOD RIGHT FOREARM  Final   Special Requests   Final    BOTTLES DRAWN AEROBIC AND ANAEROBIC Blood Culture adequate volume   Culture  Setup Time   Final    GRAM POSITIVE COCCI IN BOTH AEROBIC AND ANAEROBIC BOTTLES CRITICAL RESULT CALLED TO, READ BACK BY AND VERIFIED WITH: C PIERCE,PHARMD AT 1905 06/03/2018 BY L BENFIELD Performed at [facility], [place] [address], [place], [place] [address]    Culture STAPHYLOCOCCUS EPIDERMIDIS (A)  Final   Report Status 06/05/2018 FINAL  Final   Organism ID, Bacteria STAPHYLOCOCCUS EPIDERMIDIS  Final      Susceptibility   Staphylococcus epidermidis - MIC*    CIPROFLOXACIN >=8 RESISTANT Resistant     ERYTHROMYCIN >=8 RESISTANT Resistant     GENTAMICIN <=0.5 SENSITIVE Sensitive     OXACILLIN >=4 RESISTANT Resistant     TETRACYCLINE 2 SENSITIVE Sensitive     VANCOMYCIN 1 SENSITIVE Sensitive     TRIMETH/SULFA 80 RESISTANT Resistant     CLINDAMYCIN >=8 RESISTANT Resistant     RIFAMPIN <=0.5  SENSITIVE Sensitive     Inducible Clindamycin NEGATIVE Sensitive     * STAPHYLOCOCCUS EPIDERMIDIS  Blood Culture ID Panel (Reflexed)     Status: Abnormal   Collection Time: 06/02/18  7:05 PM  Result Value Ref Range Status   Enterococcus species NOT DETECTED NOT DETECTED Final   Listeria monocytogenes NOT DETECTED NOT DETECTED Final   Staphylococcus species DETECTED (A) NOT DETECTED Final    Comment: Methicillin (oxacillin) resistant coagulase negative staphylococcus. Possible blood culture contaminant (unless isolated from more than one blood culture draw or clinical case suggests pathogenicity). No antibiotic treatment is indicated for blood  culture contaminants. CRITICAL RESULT CALLED TO, READ BACK BY AND VERIFIED WITH: C PIERCE,PHARMD AT 1905 06/03/2018 BY L BENFIELD    Staphylococcus aureus (BCID) NOT DETECTED NOT DETECTED Final   Methicillin resistance DETECTED (A) NOT DETECTED Final    Comment: CRITICAL RESULT CALLED TO, READ BACK BY AND VERIFIED WITH: C PIERCE,PHARMD AT 1905 06/03/2018 BY L BENFIELD    Streptococcus species NOT DETECTED NOT DETECTED Final   Streptococcus agalactiae NOT DETECTED NOT DETECTED Final   Streptococcus pneumoniae NOT DETECTED NOT DETECTED Final   Streptococcus pyogenes NOT DETECTED NOT DETECTED Final   Acinetobacter baumannii NOT DETECTED NOT DETECTED Final   Enterobacteriaceae species NOT DETECTED NOT DETECTED Final   Enterobacter cloacae complex NOT DETECTED NOT DETECTED Final   Escherichia coli NOT DETECTED NOT DETECTED Final   Klebsiella oxytoca NOT DETECTED NOT DETECTED Final   Klebsiella pneumoniae NOT DETECTED NOT DETECTED Final   Proteus species NOT DETECTED NOT DETECTED Final   Serratia marcescens NOT DETECTED NOT DETECTED Final  Haemophilus influenzae NOT DETECTED NOT DETECTED Final   Neisseria meningitidis NOT DETECTED NOT DETECTED Final   Pseudomonas aeruginosa NOT DETECTED NOT DETECTED Final   Candida albicans NOT DETECTED NOT DETECTED  Final   Candida glabrata NOT DETECTED NOT DETECTED Final   Candida krusei NOT DETECTED NOT DETECTED Final   Candida parapsilosis NOT DETECTED NOT DETECTED Final   Candida tropicalis NOT DETECTED NOT DETECTED Final    Comment: Performed at [facility], [place] [address], [place], [place] [address]  Aerobic/Anaerobic Culture (surgical/deep wound)     Status: None (Preliminary result)   Collection Time: 06/03/18  6:09 PM  Result Value Ref Range Status   Specimen Description WOUND  Final   Special Requests POST ASPIRATION OF L1,L2 DISC SPACE  Final   Gram Stain   Final    FEW WBC PRESENT, PREDOMINANTLY PMN RARE GRAM POSITIVE COCCI IN CLUSTERS Performed at [facility], [place] [address], [place], [place] [address]    Culture   Final    ABUNDANT STAPHYLOCOCCUS EPIDERMIDIS NO ANAEROBES ISOLATED; CULTURE IN PROGRESS FOR 5 DAYS    Report Status PENDING  Incomplete   Organism ID, Bacteria STAPHYLOCOCCUS EPIDERMIDIS  Final      Susceptibility   Staphylococcus epidermidis - MIC*    CIPROFLOXACIN >=8 RESISTANT Resistant     ERYTHROMYCIN >=8 RESISTANT Resistant     GENTAMICIN <=0.5 SENSITIVE Sensitive     OXACILLIN >=4 RESISTANT Resistant     TETRACYCLINE 2 SENSITIVE Sensitive     VANCOMYCIN 1 SENSITIVE Sensitive     TRIMETH/SULFA 160 RESISTANT Resistant     CLINDAMYCIN >=8 RESISTANT Resistant     RIFAMPIN <=0.5 SENSITIVE Sensitive     Inducible Clindamycin NEGATIVE Sensitive     * ABUNDANT STAPHYLOCOCCUS EPIDERMIDIS  Culture, blood (routine x 2)     Status: None (Preliminary result)   Collection Time: 06/04/18  8:40 AM  Result Value Ref Range Status   Specimen Description BLOOD LEFT HAND  Final   Special Requests   Final    BOTTLES DRAWN AEROBIC AND ANAEROBIC Blood Culture adequate volume   Culture   Final    NO GROWTH 2 DAYS Performed at [facility], [address] [place], [place] [address]    Report Status PENDING  Incomplete  Culture, blood (routine x 2)      Status: None (Preliminary result)   Collection Time: 06/04/18  8:40 AM  Result Value Ref Range Status   Specimen Description BLOOD SITE NOT SPECIFIED  Final   Special Requests   Final    BOTTLES DRAWN AEROBIC AND ANAEROBIC Blood Culture adequate volume   Culture   Final    NO GROWTH 2 DAYS Performed at [facility], [place] [address], [place], [place] [address]    Report Status PENDING  Incomplete    Michel Bickers, MD [facility] [place] [phone] pager   [phone] cell 06/06/2018, 3:28 PM

## 2018-06-06 NOTE — Progress Notes (Signed)
PROGRESS NOTE    [name]  BDZ:[id number] DOB: 1950/04/19 DOA: 06/02/2018 PCP: Crist Infante, MD   Brief Narrative:  [name] is a 68 y.o. female with history of diabetes mellitus type 2, sleep apnea, hypertension, Graves' disease, chronic and disease, anemia was been experiencing low back pain since May 25, 2018 almost for last 1 week which has been increasing in intensity to the point patient is finding it difficult to walk.  Did not lose function of her extremities or did not have any incontinence of urine or bowel.  Patient had a epidural injection on May 23, 2018.  2 days later patient symptoms started coming.  Also had abdominal pain with some nausea.  Had come to the ER 3 days ago had CAT scan done which showed pancreatic mass and was advised to follow-up as outpatient.  Since pain worsened with difficulty walking now patient came back to the ER again.  Patient states he gets subjective feeling of fever and chills over the last couple of days.  Assessment & Plan:   Principal Problem:   Lumbar discitis Active Problems:   OSA on CPAP   Diabetes mellitus type 2 in obese (HCC)   Graves disease   Essential hypertension   Chronic kidney disease, stage II (mild)   Anemia due to chronic kidney disease   Epidural abscess   Aortic atherosclerosis (HCC)   Splenomegaly   Coagulase negative Staphylococcus bacteremia   Pancreatic mass   1. Lumbar discitis osteomyelitis at L1-L2  Ventral Epidural Abscess  Right > Left Paraspinal Phlegmon:  1. Vancomycin 3/14 - Present 2. blood cx 2/2 from 3/14 with oxacillin resistant staph epidermidis - follow repeat blood cultures from 3/15 - hold off on PICC until repeat cx negative 3. S/p disc space aspiration by IR on 3/14 4. Aerobic/anaerobic cx from aspiration with abundant staph epidermidis (susceptibilities pending) 5. Appreciate neurosurgery recs - no indication for surgery at this time 6. Appreciate ID recs 7. Scheduled APAP,  oxycodone prn, prn dilaudid for pain not relieved by oxy, lidocaine patch 8. PT for mobilization  2. Oxacillin Resistant Staph Epidermidis: 2/2 cultures with coag negative staph.  Aerpnoc/anaerobic cx from aspiration with abundant staph aureus. 1. Follow repeat blood cx from 3/15 (NGTD x1) - holding off on PICC for now, likely tomorrow after 72 hrs NG. 2. Echo with normal EF, but unable to visualize valve leaflets.  Will discuss TEE with ID. 3. Appreciate ID recommendations 4. Continue vancomycin (3/14 - )  3. Urinary Retention: discussed with neurosurgery, low suspicion that this is due to epidural abscess.  Suspected 2/2 pain and lack of mobilization.  Constipation also could be contributing.   1. Improving  4. Dysuria  Pyuria: follow urine cx.  Pt I/O cath'd multilpe times while inpatient now with dysuria.  Concern for UTI.  UA with few bacteria, numerous RBC's and WBC's.  1. Follow urine culture  5. Constipation: large stool burden on imaging.  Bowel regimen.   6. Pancreatic mass: lobulated appearing mass of pancreatic head, concerning for mucinous neoplasm (vs pancreatic adenocarcinoma) 1. Needs follow up MRI/MRCP or pancreatic protocol CT when able  7. Diabetes mellitus type 2  1. Lantus 30 units BID 2. SSI ACHS  8. History of Graves' disease on propylthiouracil.  Follow CBC closely.  9. Chronic kidney disease stage II creatinine appears to be at baseline.  10. Anemia, microcytic 1. Iron def anemia.  Normal B12 and folate. 2. Hold off on starting iron until  constipation improved 3. Ensure routine CRC screening    11. Sleep apnea on CPAP.  12. Hypertension on metoprolol amlodipine and Cardura.  13. Restless leg syndrome on Requip.  14. Depression continue fluoxetine.  15. Anxiety on clonazepam.  DVT prophylaxis: SCD Code Status: full  Family Communication: noneat bedside Disposition Plan: pending further improvement  Consultants:   IR  ID  Neurosurgery   Procedures:   none  Antimicrobials: Anti-infectives (From admission, onward)   Start     Dose/Rate Route Frequency Ordered Stop   06/04/18 2215  vancomycin (VANCOCIN) 1,000 mg in sodium chloride 0.9 % 250 mL IVPB     1,000 mg 250 mL/hr over 60 Minutes Intravenous Every 24 hours 06/04/18 2214     06/04/18 1900  vancomycin (VANCOCIN) IVPB 1000 mg/200 mL premix  Status:  Discontinued     1,000 mg 200 mL/hr over 60 Minutes Intravenous Every 24 hours 06/03/18 1908 06/04/18 2213   06/03/18 1915  vancomycin (VANCOCIN) 2,000 mg in sodium chloride 0.9 % 500 mL IVPB     2,000 mg 250 mL/hr over 120 Minutes Intravenous  Once 06/03/18 1908 06/03/18 2254      Subjective: Able to get up to bathroom today. Pain gradually improving. Notes dysuria.   Objective: Vitals:   06/05/18 2018 06/06/18 0012 06/06/18 0336 06/06/18 0735  BP: (!) 141/53 (!) 136/59 (!) 117/48 (!) 122/44  Pulse: 72 77 73 74  Resp: 16 17 16 16   Temp: 98 F (36.7 C) 98 F (36.7 C) 98.2 F (36.8 C) 98.2 F (36.8 C)  TempSrc: Oral Oral Oral Oral  SpO2: 96% 95% 93% 91%  Weight:      Height:        Intake/Output Summary (Last 24 hours) at 06/06/2018 0911 Last data filed at 06/06/2018 0400 Gross per 24 hour  Intake 302.65 ml  Output 875 ml  Net -572.35 ml   Filed Weights   06/02/18 1745  Weight: 108.8 kg    Examination:  General: No acute distress. Cardiovascular: Heart sounds show a regular rate, and rhythm.  Lungs: Clear to auscultation bilaterally  Abdomen: Soft, nontender, nondistended  Neurological: Alert and oriented 3. Moves all extremities 4. Cranial nerves II through XII grossly intact. Skin: Warm and dry. No rashes or lesions. Extremities: No clubbing or cyanosis. No edema.  Psychiatric: Mood and affect are normal. Insight and judgment are appropriate.    Data Reviewed: I have personally reviewed following labs and imaging studies  CBC: Recent Labs  Lab 06/02/18 0704 06/03/18 0338 06/04/18  0444 06/05/18 0608 06/06/18 0416  WBC 3.8* 4.5 3.4* 3.4* 3.8*  NEUTROABS 2.6  --   --   --   --   HGB 8.2* 9.1* 9.1* 8.6* 8.7*  HCT 28.1* 29.2* 29.8* 28.7* 29.7*  MCV 81.7 78.7* 78.6* 78.0* 78.8*  PLT 208 217 204 205 712   Basic Metabolic Panel: Recent Labs  Lab 06/02/18 0704 06/03/18 0338 06/04/18 0444 06/05/18 0608 06/06/18 0416  NA 136 135 137 135 140  K 4.2 4.2 4.2 4.0 4.7  CL 98 98 102 100 100  CO2 28 27 27 28 29   GLUCOSE 139* 167* 207* 148* 111*  BUN 26* 16 19 20 18   CREATININE 1.08* 1.02* 0.96 0.97 1.10*  CALCIUM 9.2 9.6 9.2 9.3 9.8  MG  --   --  1.9 1.9 2.0   GFR: Estimated Creatinine Clearance: 57.3 mL/min (A) (by C-G formula based on SCr of 1.1 mg/dL (H)). Liver Function Tests: Recent  Labs  Lab 05/31/18 1117 06/02/18 0704 06/04/18 0444 06/05/18 0608 06/06/18 0416  AST 17 15 16  12* 12*  ALT 14 13 13 14 13   ALKPHOS 78 77 71 71 72  BILITOT 0.5 0.5 0.6 0.3 0.4  PROT 7.7 6.9 6.4* 6.1* 6.7  ALBUMIN 3.7 3.1* 2.7* 2.5* 2.6*   Recent Labs  Lab 05/31/18 1117 06/02/18 0704  LIPASE 23 22   No results for input(s): AMMONIA in the last 168 hours. Coagulation Profile: Recent Labs  Lab 06/03/18 0852  INR 1.2   Cardiac Enzymes: No results for input(s): CKTOTAL, CKMB, CKMBINDEX, TROPONINI in the last 168 hours. BNP (last 3 results) No results for input(s): PROBNP in the last 8760 hours. HbA1C: No results for input(s): HGBA1C in the last 72 hours. CBG: Recent Labs  Lab 06/05/18 0729 06/05/18 1143 06/05/18 1633 06/05/18 2119 06/06/18 0733  GLUCAP 134* 171* 104* 170* 87   Lipid Profile: No results for input(s): CHOL, HDL, LDLCALC, TRIG, CHOLHDL, LDLDIRECT in the last 72 hours. Thyroid Function Tests: No results for input(s): TSH, T4TOTAL, FREET4, T3FREE, THYROIDAB in the last 72 hours. Anemia Panel: Recent Labs    06/04/18 0444  VITAMINB12 3,000*  FOLATE 12.4  FERRITIN 55  TIBC 305  IRON 23*   Sepsis Labs: No results for input(s):  PROCALCITON, LATICACIDVEN in the last 168 hours.  Recent Results (from the past 240 hour(s))  Urine culture     Status: None   Collection Time: 05/31/18 10:40 AM  Result Value Ref Range Status   Specimen Description   Final    URINE, CLEAN CATCH Performed at [facility], [place] [address], [place], [place] [address]    Special Requests   Final    NONE Performed at [facility], [place] [address] [place], [place] [address]    Culture   Final    NO GROWTH Performed at [facility], [place] [address], [place], [place] [address]    Report Status 06/01/2018 FINAL  Final  Blood culture (routine x 2)     Status: Abnormal   Collection Time: 06/02/18  7:05 PM  Result Value Ref Range Status   Specimen Description BLOOD RIGHT WRIST  Final   Special Requests   Final    BOTTLES DRAWN AEROBIC AND ANAEROBIC Blood Culture adequate volume   Culture  Setup Time   Final    GRAM POSITIVE COCCI IN CLUSTERS AEROBIC BOTTLE ONLY CRITICAL VALUE NOTED.  VALUE IS CONSISTENT WITH PREVIOUSLY REPORTED AND CALLED VALUE. Performed at [facility], [place] [address], [place], [place] [address]    Culture STAPHYLOCOCCUS EPIDERMIDIS (A)  Final   Report Status 06/04/2018 FINAL  Final  Blood culture (routine x 2)     Status: Abnormal   Collection Time: 06/02/18  7:05 PM  Result Value Ref Range Status   Specimen Description BLOOD RIGHT FOREARM  Final   Special Requests   Final    BOTTLES DRAWN AEROBIC AND ANAEROBIC Blood Culture adequate volume   Culture  Setup Time   Final    GRAM POSITIVE COCCI IN BOTH AEROBIC AND ANAEROBIC BOTTLES CRITICAL RESULT CALLED TO, READ BACK BY AND VERIFIED WITH: C PIERCE,PHARMD AT 1905 06/03/2018 BY L BENFIELD Performed at [facility], [place] [address] [place], [place] [address]    Culture STAPHYLOCOCCUS EPIDERMIDIS (A)  Final   Report Status 06/05/2018 FINAL  Final   Organism ID, Bacteria STAPHYLOCOCCUS EPIDERMIDIS  Final  Susceptibility   Staphylococcus epidermidis - MIC*    CIPROFLOXACIN >=8 RESISTANT Resistant     ERYTHROMYCIN >=8 RESISTANT Resistant     GENTAMICIN <=0.5 SENSITIVE Sensitive     OXACILLIN >=4 RESISTANT Resistant     TETRACYCLINE 2 SENSITIVE Sensitive     VANCOMYCIN 1 SENSITIVE Sensitive     TRIMETH/SULFA 80 RESISTANT Resistant     CLINDAMYCIN >=8 RESISTANT Resistant     RIFAMPIN <=0.5 SENSITIVE Sensitive     Inducible Clindamycin NEGATIVE Sensitive     * STAPHYLOCOCCUS EPIDERMIDIS  Blood Culture ID Panel (Reflexed)     Status: Abnormal   Collection Time: 06/02/18  7:05 PM  Result Value Ref Range Status   Enterococcus species NOT DETECTED NOT DETECTED Final   Listeria monocytogenes NOT DETECTED NOT DETECTED Final   Staphylococcus species DETECTED (A) NOT DETECTED Final    Comment: Methicillin (oxacillin) resistant coagulase negative staphylococcus. Possible blood culture contaminant (unless isolated from more than one blood culture draw or clinical case suggests pathogenicity). No antibiotic treatment is indicated for blood  culture contaminants. CRITICAL RESULT CALLED TO, READ BACK BY AND VERIFIED WITH: C PIERCE,PHARMD AT 1905 06/03/2018 BY L BENFIELD    Staphylococcus aureus (BCID) NOT DETECTED NOT DETECTED Final   Methicillin resistance DETECTED (A) NOT DETECTED Final    Comment: CRITICAL RESULT CALLED TO, READ BACK BY AND VERIFIED WITH: C PIERCE,PHARMD AT 1905 06/03/2018 BY L BENFIELD    Streptococcus species NOT DETECTED NOT DETECTED Final   Streptococcus agalactiae NOT DETECTED NOT DETECTED Final   Streptococcus pneumoniae NOT DETECTED NOT DETECTED Final   Streptococcus pyogenes NOT DETECTED NOT DETECTED Final   Acinetobacter baumannii NOT DETECTED NOT DETECTED Final   Enterobacteriaceae species NOT DETECTED NOT DETECTED Final   Enterobacter cloacae complex NOT DETECTED NOT DETECTED Final   Escherichia coli NOT DETECTED NOT DETECTED Final   Klebsiella oxytoca NOT DETECTED NOT  DETECTED Final   Klebsiella pneumoniae NOT DETECTED NOT DETECTED Final   Proteus species NOT DETECTED NOT DETECTED Final   Serratia marcescens NOT DETECTED NOT DETECTED Final   Haemophilus influenzae NOT DETECTED NOT DETECTED Final   Neisseria meningitidis NOT DETECTED NOT DETECTED Final   Pseudomonas aeruginosa NOT DETECTED NOT DETECTED Final   Candida albicans NOT DETECTED NOT DETECTED Final   Candida glabrata NOT DETECTED NOT DETECTED Final   Candida krusei NOT DETECTED NOT DETECTED Final   Candida parapsilosis NOT DETECTED NOT DETECTED Final   Candida tropicalis NOT DETECTED NOT DETECTED Final    Comment: Performed at [facility], [address] [place], [place] [address]  Aerobic/Anaerobic Culture (surgical/deep wound)     Status: None (Preliminary result)   Collection Time: 06/03/18  6:09 PM  Result Value Ref Range Status   Specimen Description WOUND  Final   Special Requests POST ASPIRATION OF L1,L2 DISC SPACE  Final   Gram Stain   Final    FEW WBC PRESENT, PREDOMINANTLY PMN RARE GRAM POSITIVE COCCI IN CLUSTERS    Culture   Final    ABUNDANT STAPHYLOCOCCUS EPIDERMIDIS SUSCEPTIBILITIES TO FOLLOW Performed at [facility], [place] [address], [place], [place] [address]    Report Status PENDING  Incomplete  Culture, blood (routine x 2)     Status: None (Preliminary result)   Collection Time: 06/04/18  8:40 AM  Result Value Ref Range Status   Specimen Description BLOOD LEFT HAND  Final   Special Requests   Final    BOTTLES DRAWN AEROBIC AND ANAEROBIC  Blood Culture adequate volume   Culture   Final    NO GROWTH 1 DAY Performed at [place] [address] [place], [place] [address]    Report Status PENDING  Incomplete  Culture, blood (routine x 2)     Status: None (Preliminary result)   Collection Time: 06/04/18  8:40 AM  Result Value Ref Range Status   Specimen Description BLOOD SITE NOT SPECIFIED  Final   Special Requests   Final    BOTTLES DRAWN  AEROBIC AND ANAEROBIC Blood Culture adequate volume   Culture   Final    NO GROWTH 1 DAY Performed at [facility], [place] [address] [place], [place] [address]    Report Status PENDING  Incomplete         Radiology Studies: No results found.      Scheduled Meds: . acetaminophen  1,000 mg Oral Q8H  . amLODipine  5 mg Oral QHS  . ARIPiprazole  5 mg Oral Daily  . cyclobenzaprine  10 mg Oral QHS  . doxazosin  2 mg Oral QHS  . fenofibrate  54 mg Oral Q supper  . FLUoxetine  60 mg Oral QHS  . gabapentin  100 mg Oral TID  . insulin aspart  0-5 Units Subcutaneous QHS  . insulin aspart  0-9 Units Subcutaneous TID WC  . insulin glargine  30 Units Subcutaneous BID  . lidocaine  1 patch Transdermal Q24H  . magnesium oxide  400 mg Oral Q lunch  . metoprolol tartrate  50 mg Oral Q8H  . pantoprazole  40 mg Oral Daily  . polyethylene glycol  17 g Oral BID  . potassium chloride SA  20 mEq Oral Q lunch  . propylthiouracil  75 mg Oral Daily  . rOPINIRole  3 mg Oral QHS  . senna-docusate  2 tablet Oral QHS  . vitamin B-12  1,000 mcg Oral Daily   Continuous Infusions: . sodium chloride 10 mL/hr at 06/05/18 2245  . vancomycin Stopped (06/05/18 2245)     LOS: 4 days    Time spent: over 37 min    Fayrene Helper, MD [facility] Pager AMION  If 7PM-7AM, please contact night-coverage [email or web address] Password [facility] 06/06/2018, 9:11 AM

## 2018-06-06 NOTE — Discharge Summary (Signed)
Physician Discharge Summary  [name] VPX:[id number] DOB: [birth year] DOA: 06/02/2018  PCP: Crist Infante, MD  Admit date: 06/02/2018 Discharge date: 06/06/2018  Time spent: 40 minutes  Recommendations for Outpatient Follow-up:  1. Follow up outpatient CBC/CMP 2. Follow up with infectious disease as outpatient 3. Continue vancomycin per OPAT orders.  Follow labs per orders.  PICC should be removed at completion of abx therapy. 4. Follow up urine culture as outpatient - modify antibiotic as necessary (discharged on macrobid), as appropriate. 5.  follow up pancreatic imaging as outpatient 6. Continue bowel regimen  7. Start iron outpatient when able with improved constipation   Discharge Diagnoses:  Principal Problem:   Lumbar discitis Active Problems:   OSA on CPAP   Diabetes mellitus type 2 in obese (HCC)   Graves disease   Essential hypertension   Chronic kidney disease, stage II (mild)   Anemia due to chronic kidney disease   Epidural abscess   Aortic atherosclerosis (HCC)   Splenomegaly   Coagulase negative Staphylococcus bacteremia   Pancreatic mass   Discharge Condition: stable  Diet recommendation: heart healthy  Filed Weights   06/02/18 1745  Weight: 108.8 kg    History of present illness:  [name] 68 y.o.femalewithhistory of diabetes mellitus type 2, sleep apnea, hypertension, Graves' disease, chronic and disease, anemia was been experiencing low back pain since May 25, 2018 almost for last 1 week which has been increasing in intensity to the point patient is finding it difficult to walk. Did not lose function of her extremities or did not have any incontinence of urine or bowel. Patient had [name] epidural injection on May 23, 2018. 2 days later patient symptoms started coming. Also had abdominal pain with some nausea. Had come to the ER 3 days ago had CAT scan done which showed pancreatic mass and was advised to follow-up as outpatient. Since  pain worsened with difficulty walking now patient came back to the ER again. Patient states he gets subjective feeling of fever and chills over the last couple of days.  She was admitted for discitis/osteomyelitis with ventral epidural abscess.  She grew staph epidermidis in her blood and from the disc space aspiration.  She was discharged on vancomycin.  Plan to follow up with ID as outpatient.   Hospital Course:  1. Lumbar discitis osteomyelitis at L1-L2  Ventral Epidural Abscess  Right > Left Paraspinal Phlegmon:  1. Vancomycin 3/14 - Present (planning for 6 weeks of therapy from negative cultures - last day 4/26) 2. blood cx 2/2 from 3/14 with oxacillin resistant staph epidermidis - follow repeat blood cultures from 3/15 - PICC placed 3/17. 3. S/p disc space aspiration by IR on 3/14 4. Aerobic/anaerobic cx from aspiration with abundant oxacillin resistant staph epidermidis  5. Appreciate neurosurgery recs - no indication for surgery at this time 6. Appreciate ID recs - outpatient ID follow up. 7. Scheduled APAP, oxycodone prn, prn dilaudid for pain not relieved by oxy, lidocaine patch 8. PT for mobilization  2. Oxacillin Resistant Staph Epidermidis: 2/2 cultures with coag negative staph.  Aerpnoc/anaerobic cx from aspiration with abundant staph aureus. 1. Follow repeat blood cx from 3/15 (NGTD x2)  2. Echo with normal EF, but unable to visualize valve leaflets.  Discussed with ID, no need for echo given coag negative staph. 3. Appreciate ID recommendations 4. Continue vancomycin (3/14 - ) (6 weeks as noted above)  3. Urinary Retention: discussed with neurosurgery, low suspicion that this is due to  epidural abscess.  Suspected 2/2 pain and lack of mobilization.  Constipation also could be contributing.   1. Improving  4. Dysuria  Pyuria: follow urine cx.  Pt I/O cath'd multilpe times while inpatient now with dysuria.  Concern for UTI.  UA with few bacteria, numerous RBC's and WBC's.   1. Follow urine culture 2. Started on macrobid  5. Constipation: large stool burden on imaging.  Bowel regimen.   6. Pancreatic mass: lobulated appearing mass of pancreatic head, concerning for mucinous neoplasm (vs pancreatic adenocarcinoma) 1. Needs follow up MRI/MRCP or pancreatic protocol CT when able  7. Diabetes mellitus type 2  1. Lantus 30 units BID 2. SSI ACHS  8. History of Graves' disease on propylthiouracil. Follow CBC closely.  9. Chronic kidney disease stage II creatinine appears to be at baseline.  10. Anemia, microcytic 1. Iron def anemia.  Normal B12 and folate. 2. Hold off on starting iron until constipation improved 3. Ensure routine CRC screening    11. Sleep apnea on CPAP.  12. Hypertension on metoprololamlodipineand Cardura.  13. Restless leg syndrome on Requip.  14. Depression continue fluoxetine.  15. Anxiety on clonazepam.  Procedures: Echo IMPRESSIONS    1. The left ventricle has normal systolic function with an ejection fraction of 60-65%. The cavity size was normal. Left ventricular diastolic Doppler parameters are consistent with pseudonormalization due to nondiagnostic images.  2. No regional wall motion abnormalities seen with echo contrast.  3. Left atrial size was mildly dilated.  4. The aortic valve was not well visualized.  3/14 L1-2 disc aspiration by IR  Consultations:  IR  ID  Neurosurgery   Discharge Exam: Vitals:   06/06/18 1546 06/06/18 1935  BP: (!) 124/45 (!) 134/45  Pulse: 68 72  Resp: 16 17  Temp: 98.5 F (36.9 C) 98.8 F (37.1 C)  SpO2: 97% 97%   Feels better. Pain better controlled.  General: No acute distress.  Sitting up in chair. Cardiovascular: Heart sounds show [name] regular rate, and rhythm. Lungs: Clear to auscultation bilaterally Abdomen: Soft, nontender, nondistended  Neurological: Alert and oriented 3. Moves all extremities 4. Cranial nerves II through XII grossly  intact. Skin: Warm and dry. No rashes or lesions. MSK: dressing in place to back.  Extremities: No clubbing or cyanosis. No edema. Psychiatric: Mood and affect are normal. Insight and judgment are appropriate.  Discharge Instructions   Discharge Instructions    Call MD for:  difficulty breathing, headache or visual disturbances   Complete by:  As directed    Call MD for:  extreme fatigue   Complete by:  As directed    Call MD for:  hives   Complete by:  As directed    Call MD for:  persistant dizziness or light-headedness   Complete by:  As directed    Call MD for:  persistant nausea and vomiting   Complete by:  As directed    Call MD for:  redness, tenderness, or signs of infection (pain, swelling, redness, odor or green/yellow discharge around incision site)   Complete by:  As directed    Call MD for:  severe uncontrolled pain   Complete by:  As directed    Call MD for:  temperature >100.4   Complete by:  As directed    Diet - low sodium heart healthy   Complete by:  As directed    Discharge instructions   Complete by:  As directed    You were seen for infection in  the spine and the blood.  We've started you on vancomycin.  This will be given at home with home health.  Please follow up with infectious disease as an outpatient.  I prescribed oxycodone for you to take as needed for pain.  Try to take less oxycodone as the pain improves.  Schedule tylenol (650 mg every 6 hours with the oxycodone), but avoid exceeding 3 grams of tylenol Rossana Molchan day (watch out for tylenol in combination pills like norco).  I've started you on macrobid for Jovian Lembcke possible urinary tract infection.  Please follow up the final culture results with your PCP in [place] few days to ensure the antibiotic does not need to be changed.    You need follow up imaging for Alfredo Collymore pancreatic mass.   Continue miralax twice daily for Chukwuemeka Artola bowel regimen.    Return for new, recurrent, or worsening symptoms.  Please ask your PCP to  request records from this hospitalization so they know what was done and what the next steps will be.   Home infusion instructions [facility] May follow [place] Dosing Protocol; May administer Cathflo as needed to maintain patency of vascular access device.; Flushing of vascular access device: per [facility] Protocol: 0.9% NaCl pre/post medica...   Complete by:  As directed    Instructions:  May follow [place] Dosing Protocol   Instructions:  May administer Cathflo as needed to maintain patency of vascular access device.   Instructions:  Flushing of vascular access device: per [facility] Protocol: 0.9% NaCl pre/post medication administration and prn patency; Heparin 100 u/ml, 71m for implanted ports and Heparin 10u/ml, 528mfor all other central venous catheters.   Instructions:  May follow [facility] Anaphylaxis Protocol for First Dose Administration in the home: 0.9% NaCl at 25-50 ml/hr to maintain IV access for protocol meds. Epinephrine 0.3 ml IV/IM PRN and Benadryl 25-50 IV/IM PRN s/s of anaphylaxis.   Instructions:  [facility] Coordinator (RN) to assist per patient IV care needs in the home PRN.   Increase activity slowly   Complete by:  As directed      Allergies as of 06/06/2018      Reactions   Imitrex [sumatriptan] Other (See Comments)   Vascular spasms   Morphine Other (See Comments)   Severe headaches   Nsaids Other (See Comments)   PT UNABLE TO TOLERATE NSAID'S DRUGS DUE TO HX OF GASTRIC SLEEVE SURGERY   Codeine Nausea Only   Promethazine Hcl Other (See Comments)   Restless leg feeling all over body   Rosuvastatin Calcium Other (See Comments)   Leg/muscle pain   Statins Other (See Comments)   Leg/muscle pain   Sulfonamide Derivatives Hives   Childhood allergy   Vicodin [hydrocodone-acetaminophen] Nausea And Vomiting      Medication List    STOP taking these medications   HYDROcodone-acetaminophen 5-325 MG tablet Commonly known as:  NORCO/VICODIN    traMADol 50 MG tablet Commonly known as:  ULTRAM     TAKE these medications   amLODipine 5 MG tablet Commonly known as:  NORVASC Take 5 mg by mouth at bedtime.   ARIPiprazole 5 MG tablet Commonly known as:  ABILIFY Take 1 tablet (5 mg total) by mouth daily.   clonazePAM 0.5 MG tablet Commonly known as:  KLONOPIN Take 0.5 mg by mouth at bedtime as needed for anxiety. Up to 3 times daily as needed for anxiety, take 1 tablet (0.5 mg) scheduled each night at bedtime.   cyclobenzaprine 10  MG tablet Commonly known as:  FLEXERIL Take 10 mg by mouth See admin instructions. Take 1 tablet (10 mg) every night at bedtime, may take an additional tablet during the day as needed for muscle spasms   dicyclomine 20 MG tablet Commonly known as:  BENTYL Take 1 tablet (20 mg total) by mouth 2 (two) times daily.   doxazosin 2 MG tablet Commonly known as:  CARDURA Take 2 mg by mouth at bedtime.   fenofibrate 54 MG tablet Take 54 mg by mouth daily with supper.   FLUoxetine 20 MG capsule Commonly known as:  PROZAC Take 60 mg by mouth at bedtime.   furosemide 20 MG tablet Commonly known as:  LASIX Take 20-40 mg by mouth See admin instructions. Take 2 tablets (40 mg) in the morning & 1 tablet (20 mg) with lunch.   gabapentin 100 MG capsule Commonly known as:  NEURONTIN Take 100 mg by mouth See admin instructions. Take up to 3 times daily as needed for neuropathy--takes scheduled in the morning,evening & at night.   Hair/Skin/Nails/Biotin Tabs Take 1 tablet by mouth daily with lunch.   Lantus 100 UNIT/ML injection Generic drug:  insulin glargine Inject 35 Units into the skin 2 (two) times daily. Based on sliding scale   MAGNESIUM PO Take 1 tablet by mouth daily with lunch.   Melatonin 10 MG Tabs Take 20 mg by mouth at bedtime as needed (for sleep.).   metoprolol tartrate 50 MG tablet Commonly known as:  LOPRESSOR Take 50 mg by mouth 3 (three) times daily.   nitrofurantoin  (macrocrystal-monohydrate) 100 MG capsule Commonly known as:  MACROBID Take 1 capsule (100 mg total) by mouth every 12 (twelve) hours for 7 days. What changed:  when to take this   NovoLOG 100 UNIT/ML injection Generic drug:  insulin aspart Inject 10 Units into the skin 2 (two) times daily.   omeprazole 20 MG capsule Commonly known as:  PRILOSEC Take 20 mg by mouth daily with supper.   ondansetron 4 MG tablet Commonly known as:  ZOFRAN Take 4 mg by mouth 2 (two) times daily as needed. For nausea/vomiting prevention with pain medication.   oxyCODONE 5 MG immediate release tablet Commonly known as:  Oxy IR/ROXICODONE Take 1-2 tablets (5-10 mg total) by mouth every 4 (four) hours as needed for up to 5 days for moderate pain or severe pain.   polyethylene glycol packet Commonly known as:  MiraLax Take 17 g by mouth 2 (two) times daily for 30 days.   potassium chloride SA 20 MEQ tablet Commonly known as:  K-DUR,KLOR-CON Take 20 mEq by mouth daily with lunch.   PreviDent 5000 Dry Mouth 1.1 % Gel dental gel Generic drug:  sodium fluoride Take 1 application by mouth daily.   propylthiouracil 50 MG tablet Commonly known as:  PTU Take 75 mg by mouth daily.   rOPINIRole 2 MG tablet Commonly known as:  REQUIP Take 3 mg by mouth at bedtime.   vancomycin  IVPB Inject 1,000 mg into the vein daily. Indication:  MRSE bacteremia and discitis  Last Day of Therapy:  07/16/2018 Labs - Sunday/Monday:  CBC/D, BMP, and vancomycin trough. Labs - Thursday:  BMP and vancomycin trough Labs - Every other week:  ESR and CRP   VITAMIN B 12 PO Take 1,000 mcg by mouth daily.   VITAMIN D3 PO Take 1,000 Units by mouth daily.            Home Infusion Instuctions  (From admission,  onward)         Start     Ordered   06/06/18 0000  Home infusion instructions [facility] May follow [place] Dosing Protocol; May administer Cathflo as needed to maintain patency of vascular access  device.; Flushing of vascular access device: per [facility] Protocol: 0.9% NaCl pre/post medica...    Question Answer Comment  Instructions May follow [place] Dosing Protocol   Instructions May administer Cathflo as needed to maintain patency of vascular access device.   Instructions Flushing of vascular access device: per [facility] Protocol: 0.9% NaCl pre/post medication administration and prn patency; Heparin 100 u/ml, 8m for implanted ports and Heparin 10u/ml, 528mfor all other central venous catheters.   Instructions May follow [facility] Anaphylaxis Protocol for First Dose Administration in the home: 0.9% NaCl at 25-50 ml/hr to maintain IV access for protocol meds. Epinephrine 0.3 ml IV/IM PRN and Benadryl 25-50 IV/IM PRN s/s of anaphylaxis.   Instructions [facility] Infusion Coordinator (RN) to assist per patient IV care needs in the home PRN.      06/06/18 1709         Allergies  Allergen Reactions  . Imitrex [Sumatriptan] Other (See Comments)    Vascular spasms  . Morphine Other (See Comments)    Severe headaches   . Nsaids Other (See Comments)    PT UNABLE TO TOLERATE NSAID'S DRUGS DUE TO HX OF GASTRIC SLEEVE SURGERY  . Codeine Nausea Only  . Promethazine Hcl Other (See Comments)    Restless leg feeling all over body  . Rosuvastatin Calcium Other (See Comments)    Leg/muscle pain  . Statins Other (See Comments)    Leg/muscle pain  . Sulfonamide Derivatives Hives    Childhood allergy   . Vicodin [Hydrocodone-Acetaminophen] Nausea And Vomiting   Follow-up Information    PeCrist InfanteMD Follow up.   Specialty:  Internal Medicine Why:  ask about follow up imaging of your pancreas Contact information: [phone]        CaMichel BickersMD Follow up.   Specialty:  Infectious Diseases Contact information: [address] [place] [phone]          The results of significant diagnostics from  this hospitalization (including imaging, microbiology, ancillary and laboratory) are listed below for reference.    Significant Diagnostic Studies: Mr Lumbar Spine Wo Contrast  Addendum Date: 06/02/2018   ADDENDUM REPORT: 06/02/2018 18:13 ADDENDUM: Study discussed by telephone with Dr. ERGareth Morgann 06/02/2018 at 1759 hours. Electronically Signed   By: H Genevie Ann.D.   On: 06/02/2018 18:13   Result Date: 06/02/2018 CLINICAL DATA:  [age]ear old female with back pain. Possible cauda equina syndrome. EXAM: MRI LUMBAR SPINE WITHOUT CONTRAST TECHNIQUE: Multiplanar, multisequence MR imaging of the lumbar spine was performed. No intravenous contrast was administered. COMPARISON:  CT Abdomen and Pelvis earlier today. Lumbar MRI 12/25/2017. FINDINGS: Segmentation: Normal on the CT today which is the same numbering system used on the 2019 MRI. Alignment: Stable since 2019. Mildly exaggerated lumbar lordosis. Mild grade 1 anterolisthesis of L5 on S1. Mild retrolisthesis at both L1-L2 and L2-L3. Vertebrae: New abnormal decreased T1 marrow signal about the L1-L2 endplates which demonstrate new abnormal increased signal in the disc space on T2 and STIR. See series 11, image 8 and series 9, image 8. Subtle endplate erosion to the right of midline on CT today. Abnormal heterogeneous T2 signal occupying space in  the ventral epidural space tracking cephalad from the disc (series 9, image 9 and series 12, image 3). Furthermore, this ventral epidural space abnormality tracks cephalad into the lower thoracic spine best seen on series 9, image 8. Prevertebral fat stranding along the right anterior disc (series 13, image 6). And bilateral medial psoas muscle edema. Other lumbar vertebrae appear stable. No other marrow edema or evidence of acute osseous abnormality. Intact visible sacrum and SI joints. Conus medullaris and cauda equina: Conus extends to the L1 level. No lower spinal cord or conus signal abnormality despite the  abnormal ventral epidural space from the lower thoracic spine to L1-L2. Paraspinal and other soft tissues: Abnormal paraspinal inflammation at L1-L2 detailed above. No intramuscular abscess. Stable visible abdominal viscera from the CT today. Disc levels: Abnormal ventral epidural space from T10-T11 through L1-L2 as stated above. At L1-L2 the constellation of abnormal disc and epidural findings results in increased and moderate spinal stenosis which is just below the level of the conus. Mild to moderate bilateral L1 foraminal stenosis has not significantly changed. L2-L3: Stable moderate to severe spinal, left lateral recess, and left L2 foraminal stenosis related to disc and posterior element degeneration. Other lumbar levels are stable since 2019. IMPRESSION: 1. Acute Discitis Osteomyelitis at L1-L2 with Ventral Epidural Abscess tracking cephalad from there to the T10-T11 level. Right greater than left mostly ventral paraspinal phlegmon. No paraspinal abscess. 2. Moderate increased Spinal Stenosis related to #1 including at the level of the Conus. No signal abnormality identified in the lower thoracic spinal cord or conus. 3. Elsewhere stable lumbar degenerative changes, including moderate to severe multifactorial spinal, left lateral recess and left foraminal stenosis at L2-L3. Electronically Signed: By: Genevie Ann M.D. On: 06/02/2018 17:55   Ct Abdomen Pelvis W Contrast  Result Date: 06/02/2018 CLINICAL DATA:  Abdominal pain, by patient report distinct from prior dated 05/31/2018. History of kidney stones, lithotomy, hernia repairs, cholecystectomy, appendectomy, hysterectomy EXAM: CT ABDOMEN AND PELVIS WITH CONTRAST TECHNIQUE: Multidetector CT imaging of the abdomen and pelvis was performed using the standard protocol following bolus administration of intravenous contrast. CONTRAST:  113m ISOVUE-300 IOPAMIDOL (ISOVUE-300) INJECTION 61% COMPARISON:  CT abdomen pelvis, 05/31/2018 FINDINGS: Lower chest: No acute  abnormality. Hepatobiliary: Redemonstrated hepatomegaly. No focal liver abnormality is seen. Status post cholecystectomy. Pancreas: Redemonstrated low-attenuation, lobulated appearing mass of the pancreatic head, measuring at least 2.7 cm (series 2, image 27). No obvious pancreatic ductal dilatation or surrounding inflammatory changes. Spleen: Redemonstrated splenomegaly, maximum span 13.5 cm. Adrenals/Urinary Tract: Adrenal glands are unremarkable. Small nonobstructive inferior pole calculus of the right kidney. No hydronephrosis. Bladder is unremarkable. Stomach/Bowel: Status post partial gastrectomy. Appendix is surgically absent. No evidence of bowel wall thickening, distention, or inflammatory changes. Large burden of stool in the colon, similar to prior examination. Vascular/Lymphatic: No significant vascular findings are present. No enlarged abdominal or pelvic lymph nodes. Reproductive: No mass or other abnormality. Status post hysterectomy. Other: Large ventral defect of the abdominal wall soft tissues, in keeping with history of ventral hernia repairs. No abdominopelvic ascites. Musculoskeletal: No acute or significant osseous findings. IMPRESSION: 1. No definite acute CT findings of the abdomen or pelvis to explain pain. 2. Unchanged lobulated, lobulated appearing mass of the pancreatic head, measuring at least 2.7 cm. As on prior examination this likely reflects Cearra Portnoy mucinous neoplasm, although pancreatic adenocarcinoma is not excluded. Recommend further evaluation with multiphasic contrast-enhanced MRI/MRCP or pancreatic protocol CT. This recommendation follows ACR consensus guidelines: Management of Incidental Pancreatic Cysts: [place]oua Chalker White  Paper of the ACR Incidental Findings Committee. J Am Coll Radiol 8768;11:572-620. 3. Large stool burden throughout the colon, similar to prior study. 4. Other chronic, incidental, and postoperative findings as detailed above. Electronically Signed   By: Eddie Candle M.D.    On: 06/02/2018 08:56   Ct Abdomen Pelvis W Contrast  Result Date: 05/31/2018 CLINICAL DATA:  Lower abdominal pain.  Abdominal distention. EXAM: CT ABDOMEN AND PELVIS WITH CONTRAST TECHNIQUE: Multidetector CT imaging of the abdomen and pelvis was performed using the standard protocol following bolus administration of intravenous contrast. CONTRAST:  152m ISOVUE-300 IOPAMIDOL (ISOVUE-300) INJECTION 61% COMPARISON:  Abdominal radiograph dated 05/30/2018 and CT scan dated 01/06/2017 FINDINGS: Lower chest: No acute abnormality.  Small chronic hiatal hernia. Hepatobiliary: No focal liver abnormality is seen. Chronic hepatomegaly. Status post cholecystectomy. No biliary dilatation. Pancreas: There is interval enlargement of the pancreatic head with ill-defined low-density within the pancreatic head. The body and tail of the pancreas are atrophic. No dilatation of the pancreatic duct. Spleen: Chronic splenomegaly, stable. Adrenals/Urinary Tract: Normal adrenal glands and kidneys. No hydronephrosis. Bladder is another. Stomach/Bowel: Previous gastric surgery. Status post appendectomy. Otherwise normal. Vascular/Lymphatic: Aortic atherosclerosis. No enlarged abdominal or pelvic lymph nodes. Reproductive: Status post hysterectomy. No adnexal masses. Other: Marked thinning of the musculature of the anterior abdominal wall, chronic. Multiple varices in the subcutaneous fat of the lower abdomen. Musculoskeletal: No acute abnormalities. Multilevel degenerative disc and joint disease in the lumbar spine. IMPRESSION: 1. Interval enlargement of the pancreatic head with and inhomogeneous low-density lobulated mass of the pancreatic head. This probably represents [name] Monforte mucinous neoplasm of the pancreas. MRI of the pancreas with and without contrast using pancreatic protocol is recommended for further evaluation. 2. Chronic hepatosplenomegaly, stable. 3.  Aortic Atherosclerosis (ICD10-I70.0). Electronically Signed   By: JLorriane Shire M.D.   On: 05/31/2018 13:39   Ir Fluoro Guide Ndl Plmt / Bx  Result Date: 06/04/2018 INDICATION: Concern for L1-L2 discitis osteomyelitis. Please perform fluoroscopic guided disc aspiration for diagnostic purposes. EXAM: FLUOROSCOPIC GUIDED ASPIRATION OF THE L1-L2 INTERVERTEBRAL DISC COMPARISON:  Lumbar spine MRI-06/02/2018; CT abdomen pelvis - 06/02/2018 MEDICATIONS: None ANESTHESIA/SEDATION: Moderate (conscious) sedation was employed during this procedure. Hatice Bubel total of Fentanyl 50 mcg and Versed 1 mg was administered intravenously. Moderate Sedation Time: 10 minutes. The patient's level of consciousness and vital signs were monitored continuously by radiology nursing throughout the procedure under my direct supervision. CONTRAST:  None FLUOROSCOPY TIME:  2 minutes, 48 seconds (90 mGy) COMPLICATIONS: None immediate. PROCEDURE: Informed written consent was obtained from the patient after Siriyah Ambrosius discussion of the risks, benefits and alternatives to treatment. Baila Rouse timeout was performed prior to the initiation of the procedure. The patient was positioned prone on the fluoroscopy table. The L1-L2 intervertebral disc space was marked fluoroscopically. Utilizing an oblique, left-sided approach, Alistar Mcenery 20 gauge Francine needle was Lawana Hartzell advanced into the targeted intervertebral disc. Appropriate positioning was confirmed with the fluoroscopic imaging in both the anterior and lateral projections. Multiple spot fluoroscopic images were saved for procedural documentation purposes. With the needle appropriate position, approximately 1 cc of purulent appearing fluid was aspirated from the disc. All fluid was capped and sent to the laboratory for analysis. The needle was removed and superficial hemostasis was achieved with manual compression. Skyy Mcknight dressing was placed. The patient tolerated the procedure well without immediate postprocedural complication. IMPRESSION: Technically successful fluoroscopic guided L1-L2 disc aspiration. Electronically  Signed   By: JSandi MariscalM.D.   On: 06/04/2018 09:30   Ct  L-spine No Charge  Result Date: 05/31/2018 CLINICAL DATA:  Back pain. EXAM: CT LUMBAR SPINE WITHOUT CONTRAST TECHNIQUE: Multidetector CT imaging of the lumbar spine was performed without intravenous contrast administration. Multiplanar CT image reconstructions were also generated. COMPARISON:  Radiographs dated 05/30/2018 and 01/18/2018 and CT scan of the abdomen and pelvis dated 01/06/2017 FINDINGS: Segmentation: 5 lumbar type vertebrae. Alignment: There is minimal retrolisthesis of L1 on L2 and of L2 on L3. Vertebrae: No fracture or bone destruction. Bilateral pars defects at L5. Multilevel degenerative facet arthritis in the lumbar spine. Paraspinal and other soft tissues: Aortic atherosclerosis. Disc levels: L1-2: Disc degeneration with disc space narrowing. Broad-based soft disc protrusion extending into both neural foramina. Moderately severe compression of the thecal sac. L2-3: Marked narrowing of the disc space with Andriana Casa vacuum phenomenon. Broad-based disc protrusion with accompanying osteophytes. Moderate left foraminal stenosis. Moderately severe spinal stenosis. L3-4: Small broad-based disc protrusion with slight compression of the thecal sac creating mild spinal stenosis. Mild bilateral facet arthritis. No foraminal stenosis. L4-5: Broad-based disc bulge without focal neural impingement. No significant foraminal stenosis. Moderately severe bilateral facet arthritis. L5-S1: Severe bilateral facet arthritis. Bilateral pars defects with grade 1 spondylolisthesis of approximately 3 mm. Moderate right foraminal stenosis. IMPRESSION: 1. No acute abnormalities of the lumbar spine. 2. Moderately severe spinal stenosis at L1-2 and L2-3 with moderate spinal stenosis at L3-4. 3. Prominent facet arthritis at L4-5 and L5-S1. 4. Bilateral spondylolysis and spondylolisthesis at L5-S1. 5.  Aortic Atherosclerosis (ICD10-I70.0). Electronically Signed   By: Lorriane Shire M.D.   On: 05/31/2018 13:29   [place] Ekg Site Rite  Result Date: 06/06/2018 If Site Rite image not attached, placement could not be confirmed due to current cardiac rhythm.   Microbiology: Recent Results (from the past 240 hour(s))  Urine culture     Status: None   Collection Time: 05/31/18 10:40 AM  Result Value Ref Range Status   Specimen Description   Final    URINE, CLEAN CATCH Performed at [facility], [place] [address], [place], [place] [address]    Special Requests   Final    NONE Performed at [facility], [place] [address], [place], [place] [address]    Culture   Final    NO GROWTH Performed at [facility], [place] [address] [place], [place] [address]    Report Status 06/01/2018 FINAL  Final  Blood culture (routine x 2)     Status: Abnormal   Collection Time: 06/02/18  7:05 PM  Result Value Ref Range Status   Specimen Description BLOOD RIGHT WRIST  Final   Special Requests   Final    BOTTLES DRAWN AEROBIC AND ANAEROBIC Blood Culture adequate volume   Culture  Setup Time   Final    GRAM POSITIVE COCCI IN CLUSTERS AEROBIC BOTTLE ONLY CRITICAL VALUE NOTED.  VALUE IS CONSISTENT WITH PREVIOUSLY REPORTED AND CALLED VALUE. Performed at [facility], [place] [address] [place],  [address]    Culture STAPHYLOCOCCUS EPIDERMIDIS (Neftaly Inzunza)  Final   Report Status 06/04/2018 FINAL  Final  Blood culture (routine x 2)     Status: Abnormal   Collection Time: 06/02/18  7:05 PM  Result Value Ref Range Status   Specimen Description BLOOD RIGHT FOREARM  Final   Special Requests   Final    BOTTLES DRAWN AEROBIC AND ANAEROBIC Blood Culture adequate volume   Culture  Setup Time   Final    GRAM POSITIVE COCCI  IN BOTH AEROBIC AND ANAEROBIC BOTTLES CRITICAL RESULT CALLED TO, READ BACK BY AND VERIFIED WITH: C PIERCE,PHARMD AT 1905 06/03/2018 BY L BENFIELD Performed at [facility], [place] [address], [place], [place] [address]    Culture  STAPHYLOCOCCUS EPIDERMIDIS ([place]ston Guiles)  Final   Report Status 06/05/2018 FINAL  Final   Organism ID, Bacteria STAPHYLOCOCCUS EPIDERMIDIS  Final      Susceptibility   Staphylococcus epidermidis - MIC*    CIPROFLOXACIN >=8 RESISTANT Resistant     ERYTHROMYCIN >=8 RESISTANT Resistant     GENTAMICIN <=0.5 SENSITIVE Sensitive     OXACILLIN >=4 RESISTANT Resistant     TETRACYCLINE 2 SENSITIVE Sensitive     VANCOMYCIN 1 SENSITIVE Sensitive     TRIMETH/SULFA 80 RESISTANT Resistant     CLINDAMYCIN >=8 RESISTANT Resistant     RIFAMPIN <=0.5 SENSITIVE Sensitive     Inducible Clindamycin NEGATIVE Sensitive     * STAPHYLOCOCCUS EPIDERMIDIS  Blood Culture ID Panel (Reflexed)     Status: Abnormal   Collection Time: 06/02/18  7:05 PM  Result Value Ref Range Status   Enterococcus species NOT DETECTED NOT DETECTED Final   Listeria monocytogenes NOT DETECTED NOT DETECTED Final   Staphylococcus species DETECTED (Eulalia Ellerman) NOT DETECTED Final    Comment: Methicillin (oxacillin) resistant coagulase negative staphylococcus. Possible blood culture contaminant (unless isolated from more than one blood culture draw or clinical case suggests pathogenicity). No antibiotic treatment is indicated for blood  culture contaminants. CRITICAL RESULT CALLED TO, READ BACK BY AND VERIFIED WITH: C PIERCE,PHARMD AT 1905 06/03/2018 BY L BENFIELD    Staphylococcus aureus (BCID) NOT DETECTED NOT DETECTED Final   Methicillin resistance DETECTED (Milee Qualls) NOT DETECTED Final    Comment: CRITICAL RESULT CALLED TO, READ BACK BY AND VERIFIED WITH: C PIERCE,PHARMD AT 1905 06/03/2018 BY L BENFIELD    Streptococcus species NOT DETECTED NOT DETECTED Final   Streptococcus agalactiae NOT DETECTED NOT DETECTED Final   Streptococcus pneumoniae NOT DETECTED NOT DETECTED Final   Streptococcus pyogenes NOT DETECTED NOT DETECTED Final   Acinetobacter baumannii NOT DETECTED NOT DETECTED Final   Enterobacteriaceae species NOT DETECTED NOT DETECTED Final    Enterobacter cloacae complex NOT DETECTED NOT DETECTED Final   Escherichia coli NOT DETECTED NOT DETECTED Final   Klebsiella oxytoca NOT DETECTED NOT DETECTED Final   Klebsiella pneumoniae NOT DETECTED NOT DETECTED Final   Proteus species NOT DETECTED NOT DETECTED Final   Serratia marcescens NOT DETECTED NOT DETECTED Final   Haemophilus influenzae NOT DETECTED NOT DETECTED Final   Neisseria meningitidis NOT DETECTED NOT DETECTED Final   Pseudomonas aeruginosa NOT DETECTED NOT DETECTED Final   Candida albicans NOT DETECTED NOT DETECTED Final   Candida glabrata NOT DETECTED NOT DETECTED Final   Candida krusei NOT DETECTED NOT DETECTED Final   Candida parapsilosis NOT DETECTED NOT DETECTED Final   Candida tropicalis NOT DETECTED NOT DETECTED Final    Comment: Performed at [facility], [address], [place], [place] [address]  Aerobic/Anaerobic Culture (surgical/deep wound)     Status: None (Preliminary result)   Collection Time: 06/03/18  6:09 PM  Result Value Ref Range Status   Specimen Description WOUND  Final   Special Requests POST ASPIRATION OF L1,L2 DISC SPACE  Final   Gram Stain   Final    FEW WBC PRESENT, PREDOMINANTLY PMN RARE GRAM POSITIVE COCCI IN CLUSTERS Performed at [facility], [place] [address], [place],  [address]    Culture   Final  ABUNDANT STAPHYLOCOCCUS EPIDERMIDIS NO ANAEROBES ISOLATED; CULTURE IN PROGRESS FOR 5 DAYS    Report Status PENDING  Incomplete   Organism ID, Bacteria STAPHYLOCOCCUS EPIDERMIDIS  Final      Susceptibility   Staphylococcus epidermidis - MIC*    CIPROFLOXACIN >=8 RESISTANT Resistant     ERYTHROMYCIN >=8 RESISTANT Resistant     GENTAMICIN <=0.5 SENSITIVE Sensitive     OXACILLIN >=4 RESISTANT Resistant     TETRACYCLINE 2 SENSITIVE Sensitive     VANCOMYCIN 1 SENSITIVE Sensitive     TRIMETH/SULFA 160 RESISTANT Resistant     CLINDAMYCIN >=8 RESISTANT Resistant     RIFAMPIN <=0.5 SENSITIVE Sensitive     Inducible  Clindamycin NEGATIVE Sensitive     * ABUNDANT STAPHYLOCOCCUS EPIDERMIDIS  Culture, blood (routine x 2)     Status: None (Preliminary result)   Collection Time: 06/04/18  8:40 AM  Result Value Ref Range Status   Specimen Description BLOOD LEFT HAND  Final   Special Requests   Final    BOTTLES DRAWN AEROBIC AND ANAEROBIC Blood Culture adequate volume   Culture   Final    NO GROWTH 2 DAYS Performed at [facility], [address] [place], [place] [address]    Report Status PENDING  Incomplete  Culture, blood (routine x 2)     Status: None (Preliminary result)   Collection Time: 06/04/18  8:40 AM  Result Value Ref Range Status   Specimen Description BLOOD SITE NOT SPECIFIED  Final   Special Requests   Final    BOTTLES DRAWN AEROBIC AND ANAEROBIC Blood Culture adequate volume   Culture   Final    NO GROWTH 2 DAYS Performed at [facility], [place] [address], [place], [place] [address]    Report Status PENDING  Incomplete     Labs: Basic Metabolic Panel: Recent Labs  Lab 06/02/18 0704 06/03/18 0338 06/04/18 0444 06/05/18 0608 06/06/18 0416  NA 136 135 137 135 140  K 4.2 4.2 4.2 4.0 4.7  CL 98 98 102 100 100  CO2 _0 GLUCOSE 139* 167* 207* 148* 111*  BUN 26* _1 CREATININE 1.08* 1.02* 0.96 0.97 1.10*  CALCIUM 9.2 9.6 9.2 9.3 9.8  MG  --   --  1.9 1.9 2.0   Liver Function Tests: Recent Labs  Lab 05/31/18 1117 06/02/18 0704 06/04/18 0444 06/05/18 0608 06/06/18 0416  AST _2 12* 12*  ALT _3 ALKPHOS 78 77 71 71 72  BILITOT 0.5 0.5 0.6 0.3 0.4  PROT 7.7 6.9 6.4* 6.1* 6.7  ALBUMIN 3.7 3.1* 2.7* 2.5* 2.6*   Recent Labs  Lab 05/31/18 1117 06/02/18 0704  LIPASE 23 22   No results for input(s): AMMONIA in the last 168 hours. CBC: Recent Labs  Lab 06/02/18 0704 06/03/18 0338 06/04/18 0444 06/05/18 0608 06/06/18 0416  WBC 3.8* 4.5 3.4* 3.4* 3.8*  NEUTROABS 2.6  --   --   --   --   HGB 8.2* 9.1* 9.1* 8.6* 8.7*   HCT 28.1* 29.2* 29.8* 28.7* 29.7*  MCV 81.7 78.7* 78.6* 78.0* 78.8*  PLT 208 217 204 205 204   Cardiac Enzymes: No results for input(s): CKTOTAL, CKMB, CKMBINDEX, TROPONINI in the last 168 hours. BNP: BNP (last 3 results) No results for input(s): BNP in the last 8760 hours.  ProBNP (last 3 results) No results for input(s): PROBNP in the last 8760 hours.  CBG: Recent Labs  Lab 06/05/18 2119 06/06/18 0733 06/06/18 1130 06/06/18 1544 06/06/18 2108  GLUCAP 170* 87 136* 72 95       Signed:  Fayrene Helper MD.  [facility] 06/06/2018, 9:49 PM

## 2018-06-06 NOTE — Progress Notes (Signed)
Physical Therapy Treatment Patient Details Name: [name] MRN: [id number] DOB: 12-Nov-1950 Today's Date: 06/06/2018    History of Present Illness [name] is a 68 y.o. female with history of diabetes mellitus type 2, sleep apnea, hypertension, Graves' disease, chronic and disease, anemia was been experiencing low back pain since May 25, 2018 almost for last 1 week which has been increasing in intensity to the point patient is finding it difficult to walk. Received epidural injetion 05/23/2018. CAT scan showed pancreatic mass and then MRI showed L1-2 discitis with epidural abscess vertral tracking cephalad from T10-11. Pt currenlty no septic.    PT Comments    Simulated home set up and pt was able to mobilize with min guard or better assist need.  Emphasis on bed mobility, sit to stand, ambulation with RW.  Pt reports husband Is home by 12 noon and can change schedule as needed.    Follow Up Recommendations  Home health PT;Supervision - Intermittent     Equipment Recommendations  None recommended by PT    Recommendations for Other Services       Precautions / Restrictions Precautions Precautions: Fall Precaution Comments: morbidly obsese    Mobility  Bed Mobility Overal bed mobility: Needs Assistance     Sidelying to sit: HOB elevated;Supervision(with rail, close simulation to her mechanical bed.)       General bed mobility comments: uses her bed to assist up to sitting along with rail.  With the bed features no assist needed.  Transfers Overall transfer level: Needs assistance Equipment used: Rolling walker (2 wheeled) Transfers: Sit to/from Stand Sit to Stand: Min guard         General transfer comment: cues for safe hand placement  Ambulation/Gait Ambulation/Gait assistance: Min guard Gait Distance (Feet): 150 Feet Assistive device: Rolling walker (2 wheeled) Gait Pattern/deviations: Step-through pattern   Gait velocity interpretation: <1.8 ft/sec,  indicate of risk for recurrent falls General Gait Details: guarded but generally steady.   Stairs             Wheelchair Mobility    Modified Rankin (Stroke Patients Only)       Balance Overall balance assessment: Needs assistance   Sitting balance-Leahy Scale: Fair       Standing balance-Leahy Scale: Fair Standing balance comment: preferring the RW at present.  able to stand without.                            Cognition Arousal/Alertness: Awake/alert Behavior During Therapy: WFL for tasks assessed/performed Overall Cognitive Status: Within Functional Limits for tasks assessed                                        Exercises      General Comments        Pertinent Vitals/Pain Pain Assessment: 0-10 Pain Score: 7 (with sitting, 3 during walking) Pain Location: back and R side Pain Descriptors / Indicators: Discomfort Pain Intervention(s): Monitored during session    Home Living                      Prior Function            PT Goals (current goals can now be found in the care plan section) Acute Rehab PT Goals Patient Stated Goal: I think I can get home. PT  Goal Formulation: With patient Time For Goal Achievement: 06/18/18 Potential to Achieve Goals: Good Progress towards PT goals: Progressing toward goals    Frequency    Min 3X/week      PT Plan Discharge plan needs to be updated    Co-evaluation              AM-PAC PT "6 Clicks" Mobility   Outcome Measure  Help needed turning from your back to your side while in a flat bed without using bedrails?: A Little Help needed moving from lying on your back to sitting on the side of a flat bed without using bedrails?: A Little Help needed moving to and from a bed to a chair (including a wheelchair)?: A Little Help needed standing up from a chair using your arms (e.g., wheelchair or bedside chair)?: A Little Help needed to walk in hospital room?: A  Little Help needed climbing 3-5 steps with a railing? : A Little 6 Click Score: 18    End of Session   Activity Tolerance: Patient tolerated treatment well;Patient limited by pain;Patient limited by fatigue Patient left: in chair;with call bell/phone within reach Nurse Communication: Mobility status PT Visit Diagnosis: Difficulty in walking, not elsewhere classified (R26.2) Pain - Right/Left: (back)     Time: 8413-2440 PT Time Calculation (min) (ACUTE ONLY): 21 min  Charges:  $Gait Training: 8-22 mins                     06/06/2018  Donnella Sham, [place] [phone]  (pager) [phone]  (office)   [name] 06/06/2018, 1:16 PM

## 2018-06-06 NOTE — TOC Progression Note (Addendum)
Transition of Care [facility]) - Progression Note    Patient Details  Name: MALLY GAVINA MRN: [id number] Date of Birth: 1950/10/27  Transition of Care [facility] CM/SW Contact  Pollie Friar, RN Phone Number: 06/06/2018, 1:34 PM  Clinical Narrative:    Jeannene Patella with [facility] notified to educated patient on administration of the IV antibiotics.   Expected Discharge Plan: Skilled Nursing Facility Barriers to Discharge: Continued Medical Work up  Expected Discharge Plan and Services Expected Discharge Plan: [place] Discharge Planning Services: CM Consult Post Acute Care Choice: [place] arrangements for the past 2 months: Single Family Home(one level)                     HH Arranged: Therapist, sports, PT, OT HH Agency: [place] [facility])--Donna with [facility] notified and accepted the referral.   Social Determinants of Health (SDOH) Interventions    Readmission Risk Interventions 30 Day Unplanned Readmission Risk Score     ED to Hosp-Admission (Current) from 06/02/2018 in [place] 3W Progressive Care  30 Day Unplanned Readmission Risk Score (%)  25 Filed at 06/06/2018 1200     This score is the patient's risk of an unplanned readmission within 30 days of being discharged (0 -100%). The score is based on dignosis, age, lab data, medications, orders, and past utilization.   Low:  0-14.9   Medium: 15-21.9   High: 22-29.9   Extreme: 30 and above       No flowsheet data found.

## 2018-06-06 NOTE — Progress Notes (Signed)
Peripherally Inserted Central Catheter/Midline Placement  The IV Nurse has discussed with the patient and/or persons authorized to consent for the patient, the purpose of this procedure and the potential benefits and risks involved with this procedure.  The benefits include less needle sticks, lab draws from the catheter, and the patient may be discharged home with the catheter. Risks include, but not limited to, infection, bleeding, blood clot (thrombus formation), and puncture of an artery; nerve damage and irregular heartbeat and possibility to perform a PICC exchange if needed/ordered by physician.  Alternatives to this procedure were also discussed.  Bard Power PICC patient education guide, fact sheet on infection prevention and patient information card has been provided to patient /or left at bedside.    PICC/Midline Placement Documentation  PICC Single Lumen 68/11/57 PICC Right Basilic 44 cm 0 cm (Active)  Indication for Insertion or Continuance of Line Home intravenous therapies (PICC only) 06/06/2018  5:56 PM  Exposed Catheter (cm) 0 cm 06/06/2018  5:56 PM  Site Assessment Clean;Dry;Intact 06/06/2018  5:56 PM  Line Status Flushed;No blood return 06/06/2018  5:56 PM  Dressing Type Transparent 06/06/2018  5:56 PM  Dressing Status Clean;Dry;Intact;Antimicrobial disc in place 06/06/2018  5:56 PM  Dressing Intervention New dressing 06/06/2018  5:56 PM  Dressing Change Due 06/13/18 06/06/2018  5:56 PM       [name] 06/06/2018, 5:58 PM

## 2018-06-06 NOTE — Progress Notes (Signed)
Notified MD that pt did not feel comfortable leaving hospital tonight.  Notified MD, received verbal order that patient could stay overnight and D/C in AM. [name]

## 2018-06-06 NOTE — Progress Notes (Signed)
PHARMACY CONSULT NOTE FOR:  OUTPATIENT  PARENTERAL ANTIBIOTIC THERAPY (OPAT)  Indication: MRSE bacteremia and discitis  Regimen: Vancomycin 1000 mg every 24 hours  End date: 07/16/2018  IV antibiotic discharge orders are pended. To discharging provider:  please sign these orders via discharge navigator,  Select New Orders & click on the button choice - Manage This Unsigned Work.     Thank you for allowing pharmacy to be a part of this patient's care.  Jimmy Footman, PharmD, BCPS, [facility] Clinical Pharmacist Phone: [phone] 06/06/2018, 2:57 PM

## 2018-06-07 DIAGNOSIS — E1169 Type 2 diabetes mellitus with other specified complication: Secondary | ICD-10-CM | POA: Diagnosis not present

## 2018-06-07 DIAGNOSIS — Z794 Long term (current) use of insulin: Secondary | ICD-10-CM | POA: Diagnosis not present

## 2018-06-07 DIAGNOSIS — M4646 Discitis, unspecified, lumbar region: Secondary | ICD-10-CM | POA: Diagnosis not present

## 2018-06-07 DIAGNOSIS — Z79891 Long term (current) use of opiate analgesic: Secondary | ICD-10-CM | POA: Diagnosis not present

## 2018-06-07 DIAGNOSIS — R7881 Bacteremia: Secondary | ICD-10-CM | POA: Diagnosis not present

## 2018-06-07 DIAGNOSIS — N182 Chronic kidney disease, stage 2 (mild): Secondary | ICD-10-CM | POA: Diagnosis not present

## 2018-06-07 DIAGNOSIS — I129 Hypertensive chronic kidney disease with stage 1 through stage 4 chronic kidney disease, or unspecified chronic kidney disease: Secondary | ICD-10-CM | POA: Diagnosis not present

## 2018-06-07 DIAGNOSIS — B9561 Methicillin susceptible Staphylococcus aureus infection as the cause of diseases classified elsewhere: Secondary | ICD-10-CM | POA: Diagnosis not present

## 2018-06-07 DIAGNOSIS — M4626 Osteomyelitis of vertebra, lumbar region: Secondary | ICD-10-CM | POA: Diagnosis not present

## 2018-06-07 DIAGNOSIS — Z452 Encounter for adjustment and management of vascular access device: Secondary | ICD-10-CM | POA: Diagnosis not present

## 2018-06-07 DIAGNOSIS — E1122 Type 2 diabetes mellitus with diabetic chronic kidney disease: Secondary | ICD-10-CM | POA: Diagnosis not present

## 2018-06-07 DIAGNOSIS — G062 Extradural and subdural abscess, unspecified: Secondary | ICD-10-CM | POA: Diagnosis not present

## 2018-06-07 LAB — CBC
HCT: 28.3 % — ABNORMAL LOW (ref 36.0–46.0)
Hemoglobin: 8.6 g/dL — ABNORMAL LOW (ref 12.0–15.0)
MCH: 23.8 pg — ABNORMAL LOW (ref 26.0–34.0)
MCHC: 30.4 g/dL (ref 30.0–36.0)
MCV: 78.2 fL — ABNORMAL LOW (ref 80.0–100.0)
Platelets: 211 10*3/uL (ref 150–400)
RBC: 3.62 MIL/uL — ABNORMAL LOW (ref 3.87–5.11)
RDW: 15.3 % (ref 11.5–15.5)
WBC: 3.3 10*3/uL — ABNORMAL LOW (ref 4.0–10.5)
nRBC: 0 % (ref 0.0–0.2)

## 2018-06-07 LAB — COMPREHENSIVE METABOLIC PANEL
ALT: 12 U/L (ref 0–44)
AST: 13 U/L — ABNORMAL LOW (ref 15–41)
Albumin: 2.6 g/dL — ABNORMAL LOW (ref 3.5–5.0)
Alkaline Phosphatase: 70 U/L (ref 38–126)
Anion gap: 7 (ref 5–15)
BUN: 15 mg/dL (ref 8–23)
CO2: 27 mmol/L (ref 22–32)
Calcium: 8.9 mg/dL (ref 8.9–10.3)
Chloride: 102 mmol/L (ref 98–111)
Creatinine, Ser: 0.95 mg/dL (ref 0.44–1.00)
GFR calc Af Amer: >60 mL/min (ref >60)
GFR calc non Af Amer: >60 mL/min (ref >60)
Glucose, Bld: 104 mg/dL — ABNORMAL HIGH (ref 70–99)
Potassium: 3.8 mmol/L (ref 3.5–5.1)
Sodium: 136 mmol/L (ref 135–145)
Total Bilirubin: 0.4 mg/dL (ref 0.3–1.2)
Total Protein: 6.6 g/dL (ref 6.5–8.1)

## 2018-06-07 LAB — MAGNESIUM: Magnesium: 1.9 mg/dL (ref 1.7–2.4)

## 2018-06-07 LAB — GLUCOSE, CAPILLARY
Glucose-Capillary: 68 mg/dL — ABNORMAL LOW (ref 70–99)
Glucose-Capillary: 108 mg/dL — ABNORMAL HIGH (ref 70–99)

## 2018-06-07 MED ORDER — DICYCLOMINE HCL 20 MG PO TABS: 20.0000 mg | ORAL_TABLET | Freq: Two times a day (BID) | ORAL | 0 refills | Status: DC

## 2018-06-07 NOTE — Progress Notes (Signed)
Physical Therapy Treatment Patient Details Name: [name] MRN: [id number] DOB: [birth year] Today's Date: 06/07/2018    History of Present Illness [name] is a 68 y.o. female with history of diabetes mellitus type 2, sleep apnea, hypertension, Graves' disease, chronic and disease, anemia was been experiencing low back pain since May 25, 2018 almost for last 1 week which has been increasing in intensity to the point patient is finding it difficult to walk. Received epidural injetion 05/23/2018. CAT scan showed pancreatic mass and then MRI showed L1-2 discitis with epidural abscess vertral tracking cephalad from T10-11. Pt currenlty no septic.    PT Comments    Pt is able to perform bed mobility with supervision and performs transfers and gait with min guard. Pt remains limited d/t pain and fatigue. Pt tolerated therex interventions well and was provided with an HEP to continue at home. Pt stated she had all the necessary equipment at home with family that will be adjusting schedules to assist her needs at home. Pt d/c to HHPT/supervision is appropriate based on progress and plan to progress pt left up to [facility] agency at this time.   Follow Up Recommendations  Home health PT;Supervision - Intermittent     Equipment Recommendations  None recommended by PT    Recommendations for Other Services       Precautions / Restrictions Precautions Precautions: Fall Precaution Comments: morbidly obsese Restrictions Weight Bearing Restrictions: No    Mobility  Bed Mobility Overal bed mobility: Needs Assistance Bed Mobility: Sidelying to Sit   Sidelying to sit: HOB elevated;Supervision       General bed mobility comments: uses her bed to assist up to sitting along with rail.  With the bed features no assist needed.  Transfers Overall transfer level: Needs assistance Equipment used: Rolling walker (2 wheeled) Transfers: Sit to/from Stand Sit to Stand: Min guard         General  transfer comment: cues for safe hand placement  Ambulation/Gait Ambulation/Gait assistance: Min guard Gait Distance (Feet): 100 Feet Assistive device: Rolling walker (2 wheeled) Gait Pattern/deviations: Step-through pattern     General Gait Details: guarded but generally steady. Pt required cues for bigger steps and keeping walker close during turns.    Stairs             Wheelchair Mobility    Modified Rankin (Stroke Patients Only)       Balance Overall balance assessment: Needs assistance Sitting-balance support: Feet supported;No upper extremity supported Sitting balance-Leahy Scale: Fair     Standing balance support: Bilateral upper extremity supported Standing balance-Leahy Scale: Fair Standing balance comment: preferring the RW at present.  able to stand without.                            Cognition Arousal/Alertness: Awake/alert Behavior During Therapy: WFL for tasks assessed/performed Overall Cognitive Status: Within Functional Limits for tasks assessed                                        Exercises General Exercises - Lower Extremity Ankle Circles/Pumps: AROM;10 reps;Seated;Both Long Arc Quad: AROM;10 reps;Seated;Both Heel Slides: AROM;10 reps;Supine;Both Hip ABduction/ADduction: AROM;10 reps;Seated;Both Hip Flexion/Marching: AROM;10 reps;Both;Seated    General Comments        Pertinent Vitals/Pain Pain Score: 8  Pain Location: back and R side Pain Descriptors / Indicators:  Discomfort    Home Living                      Prior Function            PT Goals (current goals can now be found in the care plan section) Acute Rehab PT Goals Patient Stated Goal: Go home at 11 AM. PT Goal Formulation: With patient Time For Goal Achievement: 06/18/18 Potential to Achieve Goals: Good    Frequency    Min 3X/week      PT Plan Current plan remains appropriate    Co-evaluation               AM-PAC PT "6 Clicks" Mobility   Outcome Measure  Help needed turning from your back to your side while in a flat bed without using bedrails?: A Little Help needed moving from lying on your back to sitting on the side of a flat bed without using bedrails?: A Little Help needed moving to and from a bed to a chair (including a wheelchair)?: A Little Help needed standing up from a chair using your arms (e.g., wheelchair or bedside chair)?: A Little Help needed to walk in hospital room?: A Little Help needed climbing 3-5 steps with a railing? : A Little 6 Click Score: 18    End of Session Equipment Utilized During Treatment: Gait belt Activity Tolerance: Patient tolerated treatment well;Patient limited by pain;Patient limited by fatigue Patient left: in bed;with bed alarm set;with call bell/phone within reach Nurse Communication: Mobility status PT Visit Diagnosis: Difficulty in walking, not elsewhere classified (R26.2)     Time: 0932-3557 PT Time Calculation (min) (ACUTE ONLY): 21 min  Charges:  $Gait Training: 8-22 mins                     Maryelizabeth Kaufmann, SPTA   Maryelizabeth Kaufmann 06/07/2018, 10:56 AM

## 2018-06-07 NOTE — Progress Notes (Signed)
Patient was supposed to be discharged yesterday however by the time all the appropriate paperwork was done, it was late in the evening.  Patient did not have a safe way of going back home.  She was kept overnight.  Noted to have low CBG this morning.  Improved after she was given something to eat.  Patient was asked to check her glucose levels at home regularly.  Otherwise no other issues.  Vital signs are stable this morning.  She wants to go home.  Her husband will pick her up.  Home health has been set up.  Okay for discharge.  See discharge summary by Dr. Florene Glen yesterday.

## 2018-06-07 NOTE — TOC Transition Note (Signed)
Transition of Care [facility]) - CM/SW Discharge Note   Patient Details  Name: [name] MRN: [id number] Date of Birth: [birth year]  [facility] CM/SW Contact:  Pollie Friar, RN Phone Number: 06/07/2018, 10:58 AM   Clinical Narrative:    Pt has transportation home. [place] RN to meet pt at 4 pm at her home for first home dose of IV abx.   Final next level of care: [place] Barriers to Discharge: Continued Medical Work up   Patient Goals and CMS Choice Patient states their goals for this hospitalization and ongoing recovery are:: to get around better CMS Medicare.gov Compare Post Acute Care list provided to:: Patient Choice offered to / list presented to : Patient  Discharge Placement                       Discharge Plan and Services Discharge Planning Services: CM Consult Post Acute Care Choice: Home Health              HH Arranged: RN, PT, OT [facility] Agency: [place] [facility]   Social Determinants of Health (SDOH) Interventions     Readmission Risk Interventions No flowsheet data found.

## 2018-06-07 NOTE — Progress Notes (Signed)
CRITICAL VALUE ALERT  Critical Value:  CBG 68  Date & Time Notied:  06/07/18 AT 0621  Provider Notified: NP Chaney Malling  Orders Received/Actions taken: Awaiting response

## 2018-06-08 ENCOUNTER — Other Ambulatory Visit: Payer: Self-pay | Admitting: Internal Medicine

## 2018-06-08 DIAGNOSIS — Z452 Encounter for adjustment and management of vascular access device: Secondary | ICD-10-CM | POA: Diagnosis not present

## 2018-06-08 DIAGNOSIS — M4646 Discitis, unspecified, lumbar region: Secondary | ICD-10-CM | POA: Diagnosis not present

## 2018-06-08 DIAGNOSIS — R7881 Bacteremia: Secondary | ICD-10-CM | POA: Diagnosis not present

## 2018-06-08 DIAGNOSIS — G062 Extradural and subdural abscess, unspecified: Secondary | ICD-10-CM | POA: Diagnosis not present

## 2018-06-08 DIAGNOSIS — M4626 Osteomyelitis of vertebra, lumbar region: Secondary | ICD-10-CM | POA: Diagnosis not present

## 2018-06-08 DIAGNOSIS — R935 Abnormal findings on diagnostic imaging of other abdominal regions, including retroperitoneum: Secondary | ICD-10-CM

## 2018-06-08 DIAGNOSIS — E1169 Type 2 diabetes mellitus with other specified complication: Secondary | ICD-10-CM | POA: Diagnosis not present

## 2018-06-09 DIAGNOSIS — R7881 Bacteremia: Secondary | ICD-10-CM | POA: Diagnosis not present

## 2018-06-09 DIAGNOSIS — E1169 Type 2 diabetes mellitus with other specified complication: Secondary | ICD-10-CM | POA: Diagnosis not present

## 2018-06-09 DIAGNOSIS — Z452 Encounter for adjustment and management of vascular access device: Secondary | ICD-10-CM | POA: Diagnosis not present

## 2018-06-09 DIAGNOSIS — M4626 Osteomyelitis of vertebra, lumbar region: Secondary | ICD-10-CM | POA: Diagnosis not present

## 2018-06-09 DIAGNOSIS — M4646 Discitis, unspecified, lumbar region: Secondary | ICD-10-CM | POA: Diagnosis not present

## 2018-06-09 DIAGNOSIS — G062 Extradural and subdural abscess, unspecified: Secondary | ICD-10-CM | POA: Diagnosis not present

## 2018-06-09 LAB — AEROBIC/ANAEROBIC CULTURE W GRAM STAIN (SURGICAL/DEEP WOUND)

## 2018-06-09 LAB — URINE CULTURE: Culture: >=100000 — AB

## 2018-06-09 LAB — CULTURE, BLOOD (ROUTINE X 2)
Culture: NO GROWTH
Culture: NO GROWTH
Special Requests: ADEQUATE
Special Requests: ADEQUATE

## 2018-06-09 LAB — AEROBIC/ANAEROBIC CULTURE (SURGICAL/DEEP WOUND)

## 2018-06-12 DIAGNOSIS — R7881 Bacteremia: Secondary | ICD-10-CM | POA: Diagnosis not present

## 2018-06-12 DIAGNOSIS — M4646 Discitis, unspecified, lumbar region: Secondary | ICD-10-CM | POA: Diagnosis not present

## 2018-06-12 DIAGNOSIS — M4626 Osteomyelitis of vertebra, lumbar region: Secondary | ICD-10-CM | POA: Diagnosis not present

## 2018-06-12 DIAGNOSIS — E1169 Type 2 diabetes mellitus with other specified complication: Secondary | ICD-10-CM | POA: Diagnosis not present

## 2018-06-12 DIAGNOSIS — G062 Extradural and subdural abscess, unspecified: Secondary | ICD-10-CM | POA: Diagnosis not present

## 2018-06-12 DIAGNOSIS — Z452 Encounter for adjustment and management of vascular access device: Secondary | ICD-10-CM | POA: Diagnosis not present

## 2018-06-13 DIAGNOSIS — R7881 Bacteremia: Secondary | ICD-10-CM | POA: Diagnosis not present

## 2018-06-13 DIAGNOSIS — M4646 Discitis, unspecified, lumbar region: Secondary | ICD-10-CM | POA: Diagnosis not present

## 2018-06-13 DIAGNOSIS — E1169 Type 2 diabetes mellitus with other specified complication: Secondary | ICD-10-CM | POA: Diagnosis not present

## 2018-06-13 DIAGNOSIS — Z452 Encounter for adjustment and management of vascular access device: Secondary | ICD-10-CM | POA: Diagnosis not present

## 2018-06-13 DIAGNOSIS — M4626 Osteomyelitis of vertebra, lumbar region: Secondary | ICD-10-CM | POA: Diagnosis not present

## 2018-06-13 DIAGNOSIS — G062 Extradural and subdural abscess, unspecified: Secondary | ICD-10-CM | POA: Diagnosis not present

## 2018-06-14 ENCOUNTER — Other Ambulatory Visit: Payer: Self-pay

## 2018-06-14 ENCOUNTER — Ambulatory Visit
Admission: RE | Admit: 2018-06-14 | Discharge: 2018-06-14 | Disposition: A | Discharge status: Home / Self Care | Payer: Medicare Other | Source: Ambulatory Visit | Attending: Internal Medicine | Admitting: Internal Medicine

## 2018-06-14 DIAGNOSIS — M4646 Discitis, unspecified, lumbar region: Secondary | ICD-10-CM | POA: Diagnosis not present

## 2018-06-14 DIAGNOSIS — E1169 Type 2 diabetes mellitus with other specified complication: Secondary | ICD-10-CM | POA: Diagnosis not present

## 2018-06-14 DIAGNOSIS — R935 Abnormal findings on diagnostic imaging of other abdominal regions, including retroperitoneum: Secondary | ICD-10-CM

## 2018-06-14 DIAGNOSIS — G062 Extradural and subdural abscess, unspecified: Secondary | ICD-10-CM | POA: Diagnosis not present

## 2018-06-14 DIAGNOSIS — M4626 Osteomyelitis of vertebra, lumbar region: Secondary | ICD-10-CM | POA: Diagnosis not present

## 2018-06-14 DIAGNOSIS — R7881 Bacteremia: Secondary | ICD-10-CM | POA: Diagnosis not present

## 2018-06-14 DIAGNOSIS — Z452 Encounter for adjustment and management of vascular access device: Secondary | ICD-10-CM | POA: Diagnosis not present

## 2018-06-14 MED ORDER — GADOBENATE DIMEGLUMINE 529 MG/ML IV SOLN
20.0000 mL | Freq: Once | INTRAVENOUS | Status: AC | PRN
  Administered 2018-06-14: 20 mL via INTRAVENOUS

## 2018-06-15 ENCOUNTER — Encounter: Payer: Self-pay | Admitting: Internal Medicine

## 2018-06-15 DIAGNOSIS — A419 Sepsis, unspecified organism: Secondary | ICD-10-CM | POA: Diagnosis not present

## 2018-06-15 DIAGNOSIS — M4626 Osteomyelitis of vertebra, lumbar region: Secondary | ICD-10-CM | POA: Diagnosis not present

## 2018-06-15 DIAGNOSIS — M4646 Discitis, unspecified, lumbar region: Secondary | ICD-10-CM | POA: Diagnosis not present

## 2018-06-15 DIAGNOSIS — E1169 Type 2 diabetes mellitus with other specified complication: Secondary | ICD-10-CM | POA: Diagnosis not present

## 2018-06-15 DIAGNOSIS — G062 Extradural and subdural abscess, unspecified: Secondary | ICD-10-CM | POA: Diagnosis not present

## 2018-06-15 DIAGNOSIS — Z452 Encounter for adjustment and management of vascular access device: Secondary | ICD-10-CM | POA: Diagnosis not present

## 2018-06-15 DIAGNOSIS — R7881 Bacteremia: Secondary | ICD-10-CM | POA: Diagnosis not present

## 2018-06-16 ENCOUNTER — Encounter: Payer: Self-pay | Admitting: Gastroenterology

## 2018-06-16 DIAGNOSIS — M4626 Osteomyelitis of vertebra, lumbar region: Secondary | ICD-10-CM | POA: Diagnosis not present

## 2018-06-16 DIAGNOSIS — M4646 Discitis, unspecified, lumbar region: Secondary | ICD-10-CM | POA: Diagnosis not present

## 2018-06-16 DIAGNOSIS — E1169 Type 2 diabetes mellitus with other specified complication: Secondary | ICD-10-CM | POA: Diagnosis not present

## 2018-06-16 DIAGNOSIS — G062 Extradural and subdural abscess, unspecified: Secondary | ICD-10-CM | POA: Diagnosis not present

## 2018-06-16 DIAGNOSIS — Z452 Encounter for adjustment and management of vascular access device: Secondary | ICD-10-CM | POA: Diagnosis not present

## 2018-06-16 DIAGNOSIS — R7881 Bacteremia: Secondary | ICD-10-CM | POA: Diagnosis not present

## 2018-06-16 NOTE — Progress Notes (Signed)
This is a patient of Dr. Ardis Hughs who is referred by primary care provider for further evaluation and consideration of EUS. Patient was evaluated initially on 9 March to her primary care provider with back pain and urinary discomfort.  In the emergency department on 2 separate occasions on the 11th and 13 March.  She presented for abdominal distention and abdominal discomfort.  She has a history of a complicated surgical history of her abdomen.  She is post cholecystectomy as well.  Patient underwent 2 CT scans with contrast which delineated a lesion versus in the head/neck of the pancreas.  The patient was admitted with lumbar discitis/osteomyelitis with plan therapy for multiple weeks of IV antibiotics.  This prior imaging was followed up with an MRI on 25 March.  The results are in [facility] epic.  Overall suggests a 3.4 x 2.8 cm multilocular cystic lesion in the head/neck of the pancreas with internal septations.  There is no obvious pancreatic duct connection with the cyst and there is no main pancreatic duct dilation.  The pancreas itself to me looks slightly atrophic but is not noted on any of her imaging studies to be abnormal appearing and there is no mass/lesion otherwise.  Patient has not had any pancreatitis based on labs that were obtained during her clinical visits.  Overall this does have an atypical appearance although no overt calcifications in the actual cyst itself a possible serous cystadenoma versus a mucinous cystic neoplasm.  This does not require further work-up and management and may require an endoscopic ultrasound.  This does not have to be completed emergently but does require follow-up.  I think it would be reasonable for her to be set up with Dr. Ardis Hughs for a virtual visit to discuss the consideration of an endoscopic ultrasound.  I would also be happy to be available to pursue an endoscopic ultrasound for discussion with the patient about her cyst management.  She should be continued on her  IV antibiotics for her lumbar discitis/osteomyelitis with ventral epidural abscess.  She can be scheduled for a virtual visit in approximately 2 to 3 weeks for [place] to then discussed the role of an endoscopic ultrasound.  I will relay this message to her primary gastroenterologist as well.  Justice Britain, MD [facility] Office # [phone]

## 2018-06-19 ENCOUNTER — Other Ambulatory Visit: Payer: Self-pay

## 2018-06-19 DIAGNOSIS — M4646 Discitis, unspecified, lumbar region: Secondary | ICD-10-CM | POA: Diagnosis not present

## 2018-06-19 DIAGNOSIS — G062 Extradural and subdural abscess, unspecified: Secondary | ICD-10-CM | POA: Diagnosis not present

## 2018-06-19 DIAGNOSIS — R7881 Bacteremia: Secondary | ICD-10-CM | POA: Diagnosis not present

## 2018-06-19 DIAGNOSIS — M4626 Osteomyelitis of vertebra, lumbar region: Secondary | ICD-10-CM | POA: Diagnosis not present

## 2018-06-19 DIAGNOSIS — E1169 Type 2 diabetes mellitus with other specified complication: Secondary | ICD-10-CM | POA: Diagnosis not present

## 2018-06-19 DIAGNOSIS — Z452 Encounter for adjustment and management of vascular access device: Secondary | ICD-10-CM | POA: Diagnosis not present

## 2018-06-19 NOTE — Progress Notes (Signed)
Patient notified Office visit scheduled for 07/11/18 11:15 Webex visit

## 2018-06-20 DIAGNOSIS — M4626 Osteomyelitis of vertebra, lumbar region: Secondary | ICD-10-CM | POA: Diagnosis not present

## 2018-06-20 DIAGNOSIS — G062 Extradural and subdural abscess, unspecified: Secondary | ICD-10-CM | POA: Diagnosis not present

## 2018-06-20 DIAGNOSIS — M4646 Discitis, unspecified, lumbar region: Secondary | ICD-10-CM | POA: Diagnosis not present

## 2018-06-20 DIAGNOSIS — R7881 Bacteremia: Secondary | ICD-10-CM | POA: Diagnosis not present

## 2018-06-20 DIAGNOSIS — E1169 Type 2 diabetes mellitus with other specified complication: Secondary | ICD-10-CM | POA: Diagnosis not present

## 2018-06-20 DIAGNOSIS — Z452 Encounter for adjustment and management of vascular access device: Secondary | ICD-10-CM | POA: Diagnosis not present

## 2018-06-22 DIAGNOSIS — G062 Extradural and subdural abscess, unspecified: Secondary | ICD-10-CM | POA: Diagnosis not present

## 2018-06-22 DIAGNOSIS — M4646 Discitis, unspecified, lumbar region: Secondary | ICD-10-CM | POA: Diagnosis not present

## 2018-06-22 DIAGNOSIS — Z452 Encounter for adjustment and management of vascular access device: Secondary | ICD-10-CM | POA: Diagnosis not present

## 2018-06-22 DIAGNOSIS — R7881 Bacteremia: Secondary | ICD-10-CM | POA: Diagnosis not present

## 2018-06-22 DIAGNOSIS — M4626 Osteomyelitis of vertebra, lumbar region: Secondary | ICD-10-CM | POA: Diagnosis not present

## 2018-06-22 DIAGNOSIS — E1169 Type 2 diabetes mellitus with other specified complication: Secondary | ICD-10-CM | POA: Diagnosis not present

## 2018-06-23 DIAGNOSIS — G062 Extradural and subdural abscess, unspecified: Secondary | ICD-10-CM | POA: Diagnosis not present

## 2018-06-23 DIAGNOSIS — Z452 Encounter for adjustment and management of vascular access device: Secondary | ICD-10-CM | POA: Diagnosis not present

## 2018-06-23 DIAGNOSIS — E1169 Type 2 diabetes mellitus with other specified complication: Secondary | ICD-10-CM | POA: Diagnosis not present

## 2018-06-23 DIAGNOSIS — M4626 Osteomyelitis of vertebra, lumbar region: Secondary | ICD-10-CM | POA: Diagnosis not present

## 2018-06-23 DIAGNOSIS — M4646 Discitis, unspecified, lumbar region: Secondary | ICD-10-CM | POA: Diagnosis not present

## 2018-06-23 DIAGNOSIS — R7881 Bacteremia: Secondary | ICD-10-CM | POA: Diagnosis not present

## 2018-06-26 DIAGNOSIS — E1169 Type 2 diabetes mellitus with other specified complication: Secondary | ICD-10-CM | POA: Diagnosis not present

## 2018-06-26 DIAGNOSIS — M4690 Unspecified inflammatory spondylopathy, site unspecified: Secondary | ICD-10-CM | POA: Diagnosis not present

## 2018-06-26 DIAGNOSIS — G062 Extradural and subdural abscess, unspecified: Secondary | ICD-10-CM | POA: Diagnosis not present

## 2018-06-26 DIAGNOSIS — M4316 Spondylolisthesis, lumbar region: Secondary | ICD-10-CM | POA: Diagnosis not present

## 2018-06-26 DIAGNOSIS — Z452 Encounter for adjustment and management of vascular access device: Secondary | ICD-10-CM | POA: Diagnosis not present

## 2018-06-26 DIAGNOSIS — M4626 Osteomyelitis of vertebra, lumbar region: Secondary | ICD-10-CM | POA: Diagnosis not present

## 2018-06-26 DIAGNOSIS — M4646 Discitis, unspecified, lumbar region: Secondary | ICD-10-CM | POA: Diagnosis not present

## 2018-06-26 DIAGNOSIS — R7881 Bacteremia: Secondary | ICD-10-CM | POA: Diagnosis not present

## 2018-06-28 ENCOUNTER — Telehealth: Payer: Self-pay

## 2018-06-28 NOTE — Telephone Encounter (Signed)
Received call today from [facility], Pharmacist with critical lab value. Phamracist states on 4/6 patients WBC was 2.8.  [place]

## 2018-06-29 ENCOUNTER — Telehealth: Payer: Self-pay | Admitting: Internal Medicine

## 2018-06-29 DIAGNOSIS — M4626 Osteomyelitis of vertebra, lumbar region: Secondary | ICD-10-CM | POA: Diagnosis not present

## 2018-06-29 DIAGNOSIS — E1169 Type 2 diabetes mellitus with other specified complication: Secondary | ICD-10-CM | POA: Diagnosis not present

## 2018-06-29 DIAGNOSIS — R7881 Bacteremia: Secondary | ICD-10-CM | POA: Diagnosis not present

## 2018-06-29 DIAGNOSIS — M4646 Discitis, unspecified, lumbar region: Secondary | ICD-10-CM | POA: Diagnosis not present

## 2018-06-29 DIAGNOSIS — Z452 Encounter for adjustment and management of vascular access device: Secondary | ICD-10-CM | POA: Diagnosis not present

## 2018-06-29 DIAGNOSIS — G062 Extradural and subdural abscess, unspecified: Secondary | ICD-10-CM | POA: Diagnosis not present

## 2018-06-29 NOTE — Telephone Encounter (Signed)
I received a phone call from [facility] today regarding [name]'s vancomycin therapy.  She is being treated for methicillin-resistant coagulase-negative staph lumbar discitis.  She is about 3 weeks into a planned 6-week course.  Her white blood cell count was 2.5 earlier this week.  That is only a little bit lower than her baseline.  She has chronic leukopenia and splenomegaly.  I gave an order to continue vancomycin and continue to monitor her her white blood cell count twice weekly for now.

## 2018-06-29 NOTE — Telephone Encounter (Signed)
Ok.. looks pretty similar to where she has been. Nothing to do

## 2018-06-30 DIAGNOSIS — M4626 Osteomyelitis of vertebra, lumbar region: Secondary | ICD-10-CM | POA: Diagnosis not present

## 2018-06-30 DIAGNOSIS — E1169 Type 2 diabetes mellitus with other specified complication: Secondary | ICD-10-CM | POA: Diagnosis not present

## 2018-06-30 DIAGNOSIS — Z452 Encounter for adjustment and management of vascular access device: Secondary | ICD-10-CM | POA: Diagnosis not present

## 2018-06-30 DIAGNOSIS — G062 Extradural and subdural abscess, unspecified: Secondary | ICD-10-CM | POA: Diagnosis not present

## 2018-06-30 DIAGNOSIS — M4646 Discitis, unspecified, lumbar region: Secondary | ICD-10-CM | POA: Diagnosis not present

## 2018-06-30 DIAGNOSIS — R7881 Bacteremia: Secondary | ICD-10-CM | POA: Diagnosis not present

## 2018-07-03 DIAGNOSIS — G062 Extradural and subdural abscess, unspecified: Secondary | ICD-10-CM | POA: Diagnosis not present

## 2018-07-03 DIAGNOSIS — E1169 Type 2 diabetes mellitus with other specified complication: Secondary | ICD-10-CM | POA: Diagnosis not present

## 2018-07-03 DIAGNOSIS — M4646 Discitis, unspecified, lumbar region: Secondary | ICD-10-CM | POA: Diagnosis not present

## 2018-07-03 DIAGNOSIS — Z452 Encounter for adjustment and management of vascular access device: Secondary | ICD-10-CM | POA: Diagnosis not present

## 2018-07-03 DIAGNOSIS — M4626 Osteomyelitis of vertebra, lumbar region: Secondary | ICD-10-CM | POA: Diagnosis not present

## 2018-07-03 DIAGNOSIS — R7881 Bacteremia: Secondary | ICD-10-CM | POA: Diagnosis not present

## 2018-07-06 DIAGNOSIS — G062 Extradural and subdural abscess, unspecified: Secondary | ICD-10-CM | POA: Diagnosis not present

## 2018-07-06 DIAGNOSIS — M4626 Osteomyelitis of vertebra, lumbar region: Secondary | ICD-10-CM | POA: Diagnosis not present

## 2018-07-06 DIAGNOSIS — M4646 Discitis, unspecified, lumbar region: Secondary | ICD-10-CM | POA: Diagnosis not present

## 2018-07-06 DIAGNOSIS — R7881 Bacteremia: Secondary | ICD-10-CM | POA: Diagnosis not present

## 2018-07-06 DIAGNOSIS — E1169 Type 2 diabetes mellitus with other specified complication: Secondary | ICD-10-CM | POA: Diagnosis not present

## 2018-07-06 DIAGNOSIS — Z452 Encounter for adjustment and management of vascular access device: Secondary | ICD-10-CM | POA: Diagnosis not present

## 2018-07-07 DIAGNOSIS — E1169 Type 2 diabetes mellitus with other specified complication: Secondary | ICD-10-CM | POA: Diagnosis not present

## 2018-07-07 DIAGNOSIS — I129 Hypertensive chronic kidney disease with stage 1 through stage 4 chronic kidney disease, or unspecified chronic kidney disease: Secondary | ICD-10-CM | POA: Diagnosis not present

## 2018-07-07 DIAGNOSIS — G062 Extradural and subdural abscess, unspecified: Secondary | ICD-10-CM | POA: Diagnosis not present

## 2018-07-07 DIAGNOSIS — Z452 Encounter for adjustment and management of vascular access device: Secondary | ICD-10-CM | POA: Diagnosis not present

## 2018-07-07 DIAGNOSIS — R7881 Bacteremia: Secondary | ICD-10-CM | POA: Diagnosis not present

## 2018-07-07 DIAGNOSIS — Z79891 Long term (current) use of opiate analgesic: Secondary | ICD-10-CM | POA: Diagnosis not present

## 2018-07-07 DIAGNOSIS — M4626 Osteomyelitis of vertebra, lumbar region: Secondary | ICD-10-CM | POA: Diagnosis not present

## 2018-07-07 DIAGNOSIS — Z794 Long term (current) use of insulin: Secondary | ICD-10-CM | POA: Diagnosis not present

## 2018-07-07 DIAGNOSIS — N182 Chronic kidney disease, stage 2 (mild): Secondary | ICD-10-CM | POA: Diagnosis not present

## 2018-07-07 DIAGNOSIS — E1122 Type 2 diabetes mellitus with diabetic chronic kidney disease: Secondary | ICD-10-CM | POA: Diagnosis not present

## 2018-07-07 DIAGNOSIS — B9561 Methicillin susceptible Staphylococcus aureus infection as the cause of diseases classified elsewhere: Secondary | ICD-10-CM | POA: Diagnosis not present

## 2018-07-07 DIAGNOSIS — M4646 Discitis, unspecified, lumbar region: Secondary | ICD-10-CM | POA: Diagnosis not present

## 2018-07-10 ENCOUNTER — Telehealth: Payer: Self-pay | Admitting: Internal Medicine

## 2018-07-10 DIAGNOSIS — E1169 Type 2 diabetes mellitus with other specified complication: Secondary | ICD-10-CM | POA: Diagnosis not present

## 2018-07-10 DIAGNOSIS — M4646 Discitis, unspecified, lumbar region: Secondary | ICD-10-CM | POA: Diagnosis not present

## 2018-07-10 DIAGNOSIS — Z452 Encounter for adjustment and management of vascular access device: Secondary | ICD-10-CM | POA: Diagnosis not present

## 2018-07-10 DIAGNOSIS — R7881 Bacteremia: Secondary | ICD-10-CM | POA: Diagnosis not present

## 2018-07-10 DIAGNOSIS — G062 Extradural and subdural abscess, unspecified: Secondary | ICD-10-CM | POA: Diagnosis not present

## 2018-07-10 DIAGNOSIS — M4626 Osteomyelitis of vertebra, lumbar region: Secondary | ICD-10-CM | POA: Diagnosis not present

## 2018-07-10 NOTE — Telephone Encounter (Signed)
[id number] Pre-Screening Questions: 07/10/18  Do you currently have a fever (>100 F), chills or unexplained body aches? NO Are you currently experiencing new cough, shortness of breath, sore throat, runny nose? NO   Have you recently travelled outside the state of [place] in the last 14 days? NO   Have you been in contact with someone that is currently pending confirmation of Covid19 testing or has been confirmed to have the [place] virus? NO **If the patient answers NO to ALL questions -  advise the patient to please call the clinic before coming to the office should any symptoms develop.

## 2018-07-11 ENCOUNTER — Encounter: Payer: Self-pay | Admitting: Gastroenterology

## 2018-07-11 ENCOUNTER — Ambulatory Visit (INDEPENDENT_AMBULATORY_CARE_PROVIDER_SITE_OTHER): Payer: Medicare Other | Admitting: Gastroenterology

## 2018-07-11 ENCOUNTER — Telehealth: Payer: Self-pay | Admitting: Behavioral Health

## 2018-07-11 ENCOUNTER — Emergency Department (HOSPITAL_COMMUNITY): Payer: Medicare Other

## 2018-07-11 ENCOUNTER — Inpatient Hospital Stay (HOSPITAL_COMMUNITY)
Admission: EM | Admit: 2018-07-11 | Discharge: 2018-07-31 | DRG: 028 | Disposition: A | Discharge status: Skilled Nursing Facility | Payer: Medicare Other | Attending: Internal Medicine | Admitting: Internal Medicine

## 2018-07-11 ENCOUNTER — Other Ambulatory Visit: Payer: Self-pay

## 2018-07-11 ENCOUNTER — Inpatient Hospital Stay: Payer: Medicare Other | Admitting: Internal Medicine

## 2018-07-11 ENCOUNTER — Encounter (HOSPITAL_COMMUNITY): Payer: Self-pay

## 2018-07-11 VITALS — BP 139/60 | HR 70 | Ht 61.0 in | Wt 199.0 lb

## 2018-07-11 DIAGNOSIS — D72819 Decreased white blood cell count, unspecified: Secondary | ICD-10-CM

## 2018-07-11 DIAGNOSIS — K219 Gastro-esophageal reflux disease without esophagitis: Secondary | ICD-10-CM | POA: Diagnosis present

## 2018-07-11 DIAGNOSIS — M4620 Osteomyelitis of vertebra, site unspecified: Secondary | ICD-10-CM | POA: Diagnosis not present

## 2018-07-11 DIAGNOSIS — I10 Essential (primary) hypertension: Secondary | ICD-10-CM | POA: Diagnosis present

## 2018-07-11 DIAGNOSIS — K862 Cyst of pancreas: Secondary | ICD-10-CM | POA: Diagnosis not present

## 2018-07-11 DIAGNOSIS — Z96651 Presence of right artificial knee joint: Secondary | ICD-10-CM | POA: Diagnosis present

## 2018-07-11 DIAGNOSIS — Z886 Allergy status to analgesic agent status: Secondary | ICD-10-CM

## 2018-07-11 DIAGNOSIS — F329 Major depressive disorder, single episode, unspecified: Secondary | ICD-10-CM | POA: Diagnosis present

## 2018-07-11 DIAGNOSIS — M4626 Osteomyelitis of vertebra, lumbar region: Secondary | ICD-10-CM | POA: Diagnosis present

## 2018-07-11 DIAGNOSIS — K5900 Constipation, unspecified: Secondary | ICD-10-CM | POA: Diagnosis present

## 2018-07-11 DIAGNOSIS — R52 Pain, unspecified: Secondary | ICD-10-CM

## 2018-07-11 DIAGNOSIS — M4646 Discitis, unspecified, lumbar region: Secondary | ICD-10-CM

## 2018-07-11 DIAGNOSIS — Z1159 Encounter for screening for other viral diseases: Secondary | ICD-10-CM | POA: Diagnosis not present

## 2018-07-11 DIAGNOSIS — M5136 Other intervertebral disc degeneration, lumbar region: Secondary | ICD-10-CM | POA: Diagnosis not present

## 2018-07-11 DIAGNOSIS — M4640 Discitis, unspecified, site unspecified: Secondary | ICD-10-CM | POA: Diagnosis not present

## 2018-07-11 DIAGNOSIS — G062 Extradural and subdural abscess, unspecified: Secondary | ICD-10-CM

## 2018-07-11 DIAGNOSIS — M4804 Spinal stenosis, thoracic region: Secondary | ICD-10-CM | POA: Diagnosis not present

## 2018-07-11 DIAGNOSIS — M6008 Infective myositis, other site: Secondary | ICD-10-CM | POA: Diagnosis present

## 2018-07-11 DIAGNOSIS — G4733 Obstructive sleep apnea (adult) (pediatric): Secondary | ICD-10-CM | POA: Diagnosis not present

## 2018-07-11 DIAGNOSIS — G2581 Restless legs syndrome: Secondary | ICD-10-CM | POA: Diagnosis present

## 2018-07-11 DIAGNOSIS — Z8619 Personal history of other infectious and parasitic diseases: Secondary | ICD-10-CM | POA: Diagnosis not present

## 2018-07-11 DIAGNOSIS — Z833 Family history of diabetes mellitus: Secondary | ICD-10-CM

## 2018-07-11 DIAGNOSIS — I959 Hypotension, unspecified: Secondary | ICD-10-CM | POA: Diagnosis present

## 2018-07-11 DIAGNOSIS — Z515 Encounter for palliative care: Secondary | ICD-10-CM

## 2018-07-11 DIAGNOSIS — E871 Hypo-osmolality and hyponatremia: Secondary | ICD-10-CM | POA: Diagnosis not present

## 2018-07-11 DIAGNOSIS — M797 Fibromyalgia: Secondary | ICD-10-CM | POA: Diagnosis present

## 2018-07-11 DIAGNOSIS — M6281 Muscle weakness (generalized): Secondary | ICD-10-CM | POA: Diagnosis not present

## 2018-07-11 DIAGNOSIS — M545 Low back pain: Secondary | ICD-10-CM | POA: Diagnosis not present

## 2018-07-11 DIAGNOSIS — Z794 Long term (current) use of insulin: Secondary | ICD-10-CM | POA: Diagnosis not present

## 2018-07-11 DIAGNOSIS — Z823 Family history of stroke: Secondary | ICD-10-CM

## 2018-07-11 DIAGNOSIS — D631 Anemia in chronic kidney disease: Secondary | ICD-10-CM | POA: Diagnosis not present

## 2018-07-11 DIAGNOSIS — B370 Candidal stomatitis: Secondary | ICD-10-CM | POA: Diagnosis not present

## 2018-07-11 DIAGNOSIS — M19049 Primary osteoarthritis, unspecified hand: Secondary | ICD-10-CM | POA: Diagnosis not present

## 2018-07-11 DIAGNOSIS — Z419 Encounter for procedure for purposes other than remedying health state, unspecified: Secondary | ICD-10-CM | POA: Diagnosis not present

## 2018-07-11 DIAGNOSIS — E559 Vitamin D deficiency, unspecified: Secondary | ICD-10-CM | POA: Diagnosis not present

## 2018-07-11 DIAGNOSIS — M47816 Spondylosis without myelopathy or radiculopathy, lumbar region: Secondary | ICD-10-CM | POA: Diagnosis not present

## 2018-07-11 DIAGNOSIS — Z7401 Bed confinement status: Secondary | ICD-10-CM | POA: Diagnosis not present

## 2018-07-11 DIAGNOSIS — Z8249 Family history of ischemic heart disease and other diseases of the circulatory system: Secondary | ICD-10-CM

## 2018-07-11 DIAGNOSIS — D649 Anemia, unspecified: Secondary | ICD-10-CM | POA: Diagnosis present

## 2018-07-11 DIAGNOSIS — E119 Type 2 diabetes mellitus without complications: Secondary | ICD-10-CM

## 2018-07-11 DIAGNOSIS — F339 Major depressive disorder, recurrent, unspecified: Secondary | ICD-10-CM | POA: Diagnosis not present

## 2018-07-11 DIAGNOSIS — E876 Hypokalemia: Secondary | ICD-10-CM | POA: Diagnosis not present

## 2018-07-11 DIAGNOSIS — E039 Hypothyroidism, unspecified: Secondary | ICD-10-CM | POA: Diagnosis present

## 2018-07-11 DIAGNOSIS — Z882 Allergy status to sulfonamides status: Secondary | ICD-10-CM

## 2018-07-11 DIAGNOSIS — Z9989 Dependence on other enabling machines and devices: Secondary | ICD-10-CM | POA: Diagnosis not present

## 2018-07-11 DIAGNOSIS — M48061 Spinal stenosis, lumbar region without neurogenic claudication: Secondary | ICD-10-CM | POA: Diagnosis present

## 2018-07-11 DIAGNOSIS — B9562 Methicillin resistant Staphylococcus aureus infection as the cause of diseases classified elsewhere: Secondary | ICD-10-CM | POA: Diagnosis not present

## 2018-07-11 DIAGNOSIS — K6812 Psoas muscle abscess: Secondary | ICD-10-CM | POA: Diagnosis present

## 2018-07-11 DIAGNOSIS — E1169 Type 2 diabetes mellitus with other specified complication: Secondary | ICD-10-CM | POA: Diagnosis present

## 2018-07-11 DIAGNOSIS — Z6837 Body mass index (BMI) 37.0-37.9, adult: Secondary | ICD-10-CM

## 2018-07-11 DIAGNOSIS — Z981 Arthrodesis status: Secondary | ICD-10-CM | POA: Diagnosis not present

## 2018-07-11 DIAGNOSIS — N182 Chronic kidney disease, stage 2 (mild): Secondary | ICD-10-CM | POA: Diagnosis not present

## 2018-07-11 DIAGNOSIS — G061 Intraspinal abscess and granuloma: Principal | ICD-10-CM | POA: Diagnosis present

## 2018-07-11 DIAGNOSIS — R2689 Other abnormalities of gait and mobility: Secondary | ICD-10-CM | POA: Diagnosis not present

## 2018-07-11 DIAGNOSIS — Z79891 Long term (current) use of opiate analgesic: Secondary | ICD-10-CM

## 2018-07-11 DIAGNOSIS — E669 Obesity, unspecified: Secondary | ICD-10-CM | POA: Diagnosis present

## 2018-07-11 DIAGNOSIS — E785 Hyperlipidemia, unspecified: Secondary | ICD-10-CM | POA: Diagnosis present

## 2018-07-11 DIAGNOSIS — M5489 Other dorsalgia: Secondary | ICD-10-CM | POA: Diagnosis not present

## 2018-07-11 DIAGNOSIS — Z888 Allergy status to other drugs, medicaments and biological substances status: Secondary | ICD-10-CM | POA: Diagnosis not present

## 2018-07-11 DIAGNOSIS — M609 Myositis, unspecified: Secondary | ICD-10-CM | POA: Diagnosis not present

## 2018-07-11 DIAGNOSIS — E0590 Thyrotoxicosis, unspecified without thyrotoxic crisis or storm: Secondary | ICD-10-CM | POA: Diagnosis present

## 2018-07-11 DIAGNOSIS — M8620 Subacute osteomyelitis, unspecified site: Secondary | ICD-10-CM | POA: Diagnosis not present

## 2018-07-11 DIAGNOSIS — M2550 Pain in unspecified joint: Secondary | ICD-10-CM | POA: Diagnosis not present

## 2018-07-11 DIAGNOSIS — G894 Chronic pain syndrome: Secondary | ICD-10-CM | POA: Diagnosis present

## 2018-07-11 DIAGNOSIS — Z792 Long term (current) use of antibiotics: Secondary | ICD-10-CM | POA: Diagnosis not present

## 2018-07-11 DIAGNOSIS — Z885 Allergy status to narcotic agent status: Secondary | ICD-10-CM | POA: Diagnosis not present

## 2018-07-11 DIAGNOSIS — E0500 Thyrotoxicosis with diffuse goiter without thyrotoxic crisis or storm: Secondary | ICD-10-CM | POA: Diagnosis present

## 2018-07-11 DIAGNOSIS — J45909 Unspecified asthma, uncomplicated: Secondary | ICD-10-CM | POA: Diagnosis not present

## 2018-07-11 DIAGNOSIS — F419 Anxiety disorder, unspecified: Secondary | ICD-10-CM | POA: Diagnosis not present

## 2018-07-11 DIAGNOSIS — I700 Atherosclerosis of aorta: Secondary | ICD-10-CM | POA: Diagnosis not present

## 2018-07-11 DIAGNOSIS — F319 Bipolar disorder, unspecified: Secondary | ICD-10-CM | POA: Diagnosis not present

## 2018-07-11 DIAGNOSIS — R531 Weakness: Secondary | ICD-10-CM | POA: Diagnosis not present

## 2018-07-11 DIAGNOSIS — R197 Diarrhea, unspecified: Secondary | ICD-10-CM | POA: Diagnosis not present

## 2018-07-11 DIAGNOSIS — R0902 Hypoxemia: Secondary | ICD-10-CM | POA: Diagnosis not present

## 2018-07-11 DIAGNOSIS — N393 Stress incontinence (female) (male): Secondary | ICD-10-CM | POA: Diagnosis not present

## 2018-07-11 DIAGNOSIS — Z96611 Presence of right artificial shoulder joint: Secondary | ICD-10-CM | POA: Diagnosis present

## 2018-07-11 DIAGNOSIS — M532X6 Spinal instabilities, lumbar region: Secondary | ICD-10-CM | POA: Diagnosis not present

## 2018-07-11 LAB — CBC WITH DIFFERENTIAL/PLATELET
Abs Immature Granulocytes: 0.02 10*3/uL (ref 0.00–0.07)
Basophils Absolute: 0 10*3/uL (ref 0.0–0.1)
Basophils Relative: 0 %
Eosinophils Absolute: 0 10*3/uL (ref 0.0–0.5)
Eosinophils Relative: 0 %
HCT: 32.1 % — ABNORMAL LOW (ref 36.0–46.0)
Hemoglobin: 9.8 g/dL — ABNORMAL LOW (ref 12.0–15.0)
Immature Granulocytes: 1 %
Lymphocytes Relative: 21 %
Lymphs Abs: 0.8 10*3/uL (ref 0.7–4.0)
MCH: 24.4 pg — ABNORMAL LOW (ref 26.0–34.0)
MCHC: 30.5 g/dL (ref 30.0–36.0)
MCV: 79.9 fL — ABNORMAL LOW (ref 80.0–100.0)
Monocytes Absolute: 0.5 10*3/uL (ref 0.1–1.0)
Monocytes Relative: 13 %
Neutro Abs: 2.4 10*3/uL (ref 1.7–7.7)
Neutrophils Relative %: 65 %
Platelets: 251 10*3/uL (ref 150–400)
RBC: 4.02 MIL/uL (ref 3.87–5.11)
RDW: 16.2 % — ABNORMAL HIGH (ref 11.5–15.5)
WBC: 3.6 10*3/uL — ABNORMAL LOW (ref 4.0–10.5)
nRBC: 0 % (ref 0.0–0.2)

## 2018-07-11 LAB — COMPREHENSIVE METABOLIC PANEL
ALT: 18 U/L (ref 0–44)
AST: 36 U/L (ref 15–41)
Albumin: 3.6 g/dL (ref 3.5–5.0)
Alkaline Phosphatase: 104 U/L (ref 38–126)
Anion gap: 13 (ref 5–15)
BUN: 14 mg/dL (ref 8–23)
CO2: 29 mmol/L (ref 22–32)
Calcium: 9.7 mg/dL (ref 8.9–10.3)
Chloride: 94 mmol/L — ABNORMAL LOW (ref 98–111)
Creatinine, Ser: 0.93 mg/dL (ref 0.44–1.00)
GFR calc Af Amer: >60 mL/min (ref >60)
GFR calc non Af Amer: >60 mL/min (ref >60)
Glucose, Bld: 148 mg/dL — ABNORMAL HIGH (ref 70–99)
Potassium: 4.2 mmol/L (ref 3.5–5.1)
Sodium: 136 mmol/L (ref 135–145)
Total Bilirubin: 1 mg/dL (ref 0.3–1.2)
Total Protein: 8.1 g/dL (ref 6.5–8.1)

## 2018-07-11 LAB — GLUCOSE, CAPILLARY
Glucose-Capillary: 121 mg/dL — ABNORMAL HIGH (ref 70–99)
Glucose-Capillary: 114 mg/dL — ABNORMAL HIGH (ref 70–99)

## 2018-07-11 MED ORDER — HYDROMORPHONE HCL 1 MG/ML IJ SOLN
1.0000 mg | INTRAMUSCULAR | Status: DC | PRN
  Administered 2018-07-11 – 2018-07-13 (×7): 1 mg via INTRAVENOUS
  Filled 2018-07-11 (×7): qty 1

## 2018-07-11 MED ORDER — FENOFIBRATE 54 MG PO TABS
54.0000 mg | ORAL_TABLET | Freq: Every day | ORAL | Status: DC
  Administered 2018-07-11 – 2018-07-31 (×18): 54 mg via ORAL
  Filled 2018-07-11 (×20): qty 1

## 2018-07-11 MED ORDER — INSULIN ASPART 100 UNIT/ML ~~LOC~~ SOLN
0.0000 [IU] | Freq: Three times a day (TID) | SUBCUTANEOUS | Status: DC
  Administered 2018-07-12 – 2018-07-13 (×4): 1 [IU] via SUBCUTANEOUS
  Administered 2018-07-15: 3 [IU] via SUBCUTANEOUS
  Administered 2018-07-16 (×2): 1 [IU] via SUBCUTANEOUS
  Administered 2018-07-18 – 2018-07-20 (×5): 2 [IU] via SUBCUTANEOUS
  Administered 2018-07-21 – 2018-07-23 (×3): 1 [IU] via SUBCUTANEOUS
  Administered 2018-07-23: 5 [IU] via SUBCUTANEOUS
  Administered 2018-07-24: 2 [IU] via SUBCUTANEOUS
  Administered 2018-07-25: 1 [IU] via SUBCUTANEOUS
  Administered 2018-07-25: 2 [IU] via SUBCUTANEOUS
  Administered 2018-07-26: 13:00:00 1 [IU] via SUBCUTANEOUS
  Administered 2018-07-27: 2 [IU] via SUBCUTANEOUS
  Administered 2018-07-29: 1 [IU] via SUBCUTANEOUS
  Administered 2018-07-29 – 2018-07-31 (×2): 2 [IU] via SUBCUTANEOUS
  Administered 2018-07-31: 1 [IU] via SUBCUTANEOUS

## 2018-07-11 MED ORDER — ROPINIROLE HCL 1 MG PO TABS
3.0000 mg | ORAL_TABLET | Freq: Every day | ORAL | Status: DC
  Administered 2018-07-11 – 2018-07-30 (×20): 3 mg via ORAL
  Filled 2018-07-11 (×21): qty 3

## 2018-07-11 MED ORDER — CLONAZEPAM 0.5 MG PO TABS
0.5000 mg | ORAL_TABLET | Freq: Every day | ORAL | Status: DC
  Administered 2018-07-11 – 2018-07-30 (×20): 0.5 mg via ORAL
  Filled 2018-07-11 (×20): qty 1

## 2018-07-11 MED ORDER — METOPROLOL TARTRATE 25 MG PO TABS
50.0000 mg | ORAL_TABLET | Freq: Three times a day (TID) | ORAL | Status: DC
  Administered 2018-07-11 – 2018-07-13 (×6): 50 mg via ORAL
  Filled 2018-07-11 (×6): qty 2

## 2018-07-11 MED ORDER — BISACODYL 10 MG RE SUPP: 10.0000 mg | Freq: Every day | RECTAL | Status: DC | PRN

## 2018-07-11 MED ORDER — OXYCODONE HCL 5 MG PO TABS
10.0000 mg | ORAL_TABLET | ORAL | Status: DC | PRN
  Administered 2018-07-11 – 2018-07-17 (×20): 10 mg via ORAL
  Filled 2018-07-11 (×23): qty 2

## 2018-07-11 MED ORDER — PANTOPRAZOLE SODIUM 40 MG PO TBEC
40.0000 mg | DELAYED_RELEASE_TABLET | Freq: Every day | ORAL | Status: DC
  Administered 2018-07-11 – 2018-07-31 (×20): 40 mg via ORAL
  Filled 2018-07-11 (×20): qty 1

## 2018-07-11 MED ORDER — SODIUM CHLORIDE 0.9% FLUSH: 3.0000 mL | INTRAVENOUS | Status: DC | PRN

## 2018-07-11 MED ORDER — CYCLOBENZAPRINE HCL 10 MG PO TABS
10.0000 mg | ORAL_TABLET | Freq: Once | ORAL | Status: AC
  Administered 2018-07-11: 10 mg via ORAL
  Filled 2018-07-11: qty 1

## 2018-07-11 MED ORDER — SODIUM CHLORIDE 0.9 % IV BOLUS
500.0000 mL | Freq: Once | INTRAVENOUS | Status: AC
  Administered 2018-07-11: 500 mL via INTRAVENOUS

## 2018-07-11 MED ORDER — AMLODIPINE BESYLATE 5 MG PO TABS
5.0000 mg | ORAL_TABLET | Freq: Every day | ORAL | Status: DC
  Administered 2018-07-11 – 2018-07-12 (×2): 5 mg via ORAL
  Filled 2018-07-11 (×2): qty 1

## 2018-07-11 MED ORDER — PROPYLTHIOURACIL 50 MG PO TABS
75.0000 mg | ORAL_TABLET | Freq: Every day | ORAL | Status: DC
  Administered 2018-07-11 – 2018-07-31 (×20): 75 mg via ORAL
  Filled 2018-07-11 (×21): qty 1.5

## 2018-07-11 MED ORDER — GADOBUTROL 1 MMOL/ML IV SOLN
10.0000 mL | Freq: Once | INTRAVENOUS | Status: AC | PRN
  Administered 2018-07-11: 10 mL via INTRAVENOUS

## 2018-07-11 MED ORDER — FLUOXETINE HCL 20 MG PO CAPS
60.0000 mg | ORAL_CAPSULE | Freq: Every day | ORAL | Status: DC
  Administered 2018-07-11 – 2018-07-30 (×20): 60 mg via ORAL
  Filled 2018-07-11 (×20): qty 3

## 2018-07-11 MED ORDER — ACETAMINOPHEN 650 MG RE SUPP: 650.0000 mg | Freq: Four times a day (QID) | RECTAL | Status: DC | PRN

## 2018-07-11 MED ORDER — ARIPIPRAZOLE 10 MG PO TABS
5.0000 mg | ORAL_TABLET | Freq: Every day | ORAL | Status: DC
  Administered 2018-07-11 – 2018-07-31 (×20): 5 mg via ORAL
  Filled 2018-07-11 (×20): qty 1

## 2018-07-11 MED ORDER — POLYETHYLENE GLYCOL 3350 17 G PO PACK
17.0000 g | PACK | Freq: Every day | ORAL | Status: DC
  Administered 2018-07-11 – 2018-07-15 (×5): 17 g via ORAL
  Filled 2018-07-11 (×5): qty 1

## 2018-07-11 MED ORDER — ONDANSETRON HCL 4 MG/2ML IJ SOLN
4.0000 mg | Freq: Once | INTRAMUSCULAR | Status: AC | PRN
  Administered 2018-07-11: 4 mg via INTRAVENOUS
  Filled 2018-07-11: qty 2

## 2018-07-11 MED ORDER — SODIUM CHLORIDE 0.9 % IV SOLN
250.0000 mL | INTRAVENOUS | Status: DC | PRN
  Administered 2018-07-25 – 2018-07-27 (×2): 250 mL via INTRAVENOUS

## 2018-07-11 MED ORDER — HYDROMORPHONE HCL 1 MG/ML IJ SOLN
1.0000 mg | Freq: Once | INTRAMUSCULAR | Status: AC
  Administered 2018-07-11: 1 mg via INTRAVENOUS
  Filled 2018-07-11: qty 1

## 2018-07-11 MED ORDER — FENTANYL 12 MCG/HR TD PT72
1.0000 | MEDICATED_PATCH | TRANSDERMAL | Status: DC
  Administered 2018-07-11 – 2018-07-14 (×2): 1 via TRANSDERMAL
  Filled 2018-07-11 (×2): qty 1

## 2018-07-11 MED ORDER — ACETAMINOPHEN 325 MG PO TABS
650.0000 mg | ORAL_TABLET | Freq: Four times a day (QID) | ORAL | Status: DC | PRN
  Administered 2018-07-16 – 2018-07-24 (×2): 650 mg via ORAL
  Filled 2018-07-11 (×3): qty 2

## 2018-07-11 MED ORDER — INSULIN ASPART 100 UNIT/ML ~~LOC~~ SOLN
0.0000 [IU] | Freq: Every day | SUBCUTANEOUS | Status: DC
  Administered 2018-07-23 – 2018-07-28 (×2): 2 [IU] via SUBCUTANEOUS

## 2018-07-11 MED ORDER — GABAPENTIN 100 MG PO CAPS
100.0000 mg | ORAL_CAPSULE | ORAL | Status: DC
  Administered 2018-07-11 – 2018-07-18 (×23): 100 mg via ORAL
  Filled 2018-07-11 (×23): qty 1

## 2018-07-11 MED ORDER — INSULIN GLARGINE 100 UNIT/ML ~~LOC~~ SOLN
20.0000 [IU] | Freq: Two times a day (BID) | SUBCUTANEOUS | Status: DC
  Administered 2018-07-11 – 2018-07-31 (×40): 20 [IU] via SUBCUTANEOUS
  Filled 2018-07-11 (×44): qty 0.2

## 2018-07-11 MED ORDER — SODIUM CHLORIDE 0.9% FLUSH
3.0000 mL | Freq: Two times a day (BID) | INTRAVENOUS | Status: DC
  Administered 2018-07-12 – 2018-07-28 (×7): 3 mL via INTRAVENOUS

## 2018-07-11 NOTE — Telephone Encounter (Signed)
Called patient to schedule an EVISIT with Dr. Megan Salon tomorrow 07/12/2018 at 11:45.  PAtient confirmed EVISIT.  Patient's husband and patient were both on the phone.  [name] states her pain is worse today than it has been.  [name] rates pain 10/10 in groin, and hips she states she is taking Morphine Q 12 hours and Oxycodone for break through pain with no relief..  She states she has not been able to get out of bed, she is nauseated , decreased appetite, and weak.  She states her antibiotic therapy has been going well and has had not issues with the antibiotics or PICC line.  She states she called Dr. Silvestre Mesi office as well to inform them about her new symptoms but is waiting for a call back.  Informed patient if symptoms persist she needs to go to the Emergency room for evaluation and Dr. Megan Salon would be made aware.  Patient and her her husband [name] both verbalized understanding. Pricilla Riffle RN

## 2018-07-11 NOTE — H&P (Signed)
History and Physical    LEYANNA BITTMAN  JZP:[id number]  DOB: 10-Jan-1951  DOA: 07/11/2018 PCP: Crist Infante, MD   Patient coming from: home  Chief Complaint: lumbar back pain  HPI: [name] is a 68 y.o. female with medical history of DM 2 who presents with lower back pain. She is currently being treated for a lumbar discitis with Methicillin resistant coag neg staph with Vancomycin at home. She developed increasing back pain about 1 wk ago and noted a fever of about 100 degrees. She has had worsening of her pain with it radiating to her left buttock. She has no other complaints.   ED Course:  MRI lumbar spine> Progression of discitis and osteomyelitis at L1-2. Progressive fluid/pus in the disc space extending into the anterior paraspinous soft tissues compatible with progressive abscess. Progressive abscess in the left psoas muscle. Progressive myositis throughout the psoas muscles bilaterally and the erector spinae muscles bilaterally. Posterior extradural fluid collection on the left at L1-2 has progressed in the interval and may represent abscess.  Review of Systems:  All other systems reviewed and apart from HPI, are negative.  Past Medical History:  Diagnosis Date   Anemia    Arthritis    Asthma    OCCAS   Bell's palsy    Broken neck (HCC)    C-1   Carbuncle and furuncle of trunk    Chronic dislocation of right shoulder    Depression    Disorder of fascia    HX OF NECROTIC FASCITIS AFTER ABDOMINAL SURGERY FOR HERNIA- REQUIRED 19 SURGERIES AND 2.5 MONTH HOSPITALIZATION AT [facility]   DM (diabetes mellitus) ([place])    Dysrhythmia    HX OF TACHYCARDIA AND BRADYCARDIA - ON GOING FOR YEARS - DOES NOT HAVE TO SEE CARDIOLOGIST   Elevated cholesterol    Fibromyalgia    Fracture MAY 2014   HX OF FRACTURED NECK C1- CAUSES SEVERE HEADACHES--LIMITED ROM NECK   Frequent infections    ESPECIALLY PRONE TO INFECTIONS AFTER SURGERIES   Graves disease    Heart  murmur    DOES NOT CAUSE ANY PROBLEMS   History of kidney stones    Hyperlipidemia    Hypertension    Hypothyroidism    GRAVES DISEASE   Migraine    Nephrolithiasis    STAGE 3    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   Neuropathy    Obesity    OSA (obstructive sleep apnea)    USES CPAP - DOES NOT KNOW SETTING   Pain    LEFT SHOULDER  PAIN -HARD TO LIE ON LEFT SIDE FOR LONG PERIOD;  PAIN IN LOWER BADK - 3 HERNIATED DISCS AND STENOISIS -   PONV (postoperative nausea and vomiting)    THE GAS MAKES ME NAUSEATED   Restless leg syndrome    Sciatica    Tachycardia    Urinary frequency    Urticaria    UTI (urinary tract infection)     Past Surgical History:  Procedure Laterality Date   ABDOMINAL HYSTERECTOMY     LARGE TUMOR AT OVARY REMOVED   APPENDECTOMY     CARPAL TUNNEL RELEASE     BILATERAL   CATARACT EXTRACTION W/ INTRAOCULAR LENS  IMPLANT, BILATERAL     CESAREAN SECTION     X 3   CHOLECYSTECTOMY     CYSTOSCOPY WITH URETEROSCOPY Right 12/03/2013   Procedure: CYSTOSCOPY WITH RIGHT RETROGRADE URETEROSCOPY LASER LITHOTRIPSY RIGHT STONE RIGHT URETERAL STENT, ;  Surgeon: Raynelle Bring, MD;  Location: [facility];  Service: Urology;  Laterality: Right;  PROCEDURE WAS ORIGINALLY SCHEDULED AS RIGHT PERCUTANEOUS NEPHROLITHOTOMY   EYELID LACERATION REPAIR     RIGHT EYE   HERNIA REPAIR     ABDOMINAL HERNIA REPAIR WITH MESH - 3 SURGERIES    HX OF 19 SURGERIES FOR NECROTIC FASCITIS     SKIN GRAFTS+ WOUND  VAC   I&D OF INFECTED SITE IN [place] - FROM AN INJECTION     IR FLUORO GUIDED NEEDLE PLC ASPIRATION/INJECTION LOC  06/03/2018   JOINT REPLACEMENT     TOTAL RIGHT KNEE REPLACEMENT   KNEE ARTHROSCOPY  RIGHT AND LEFT   X 2   LAPAROSCOPIC GASTRIC SLEEVE RESECTION  2014   NEPHROLITHOTOMY Right 12/10/2013   Procedure: NEPHROLITHOTOMY PERCUTANEOUS;  Surgeon: Raynelle Bring, MD;  Location: [facility];  Service: Urology;  Laterality: Right;   REVERSE SHOULDER ARTHROPLASTY  Right 07/21/2017   REVERSE SHOULDER ARTHROPLASTY Right 07/21/2017   Procedure: RIGHT REVERSE SHOULDER ARTHROPLASTY;  Surgeon: Tania Ade, MD;  Location: [place];  Service: Orthopedics;  Laterality: Right;   RIGHT FOOT DRAINAGE OF INFECTION     shoulder arthroscopy Right    X 2   TONSILLECTOMY     AND ADENOIDECTOMY    Social History:   reports that she has never smoked. She has never used smokeless tobacco. She reports that she does not drink alcohol or use drugs.  Allergies  Allergen Reactions   Imitrex [Sumatriptan] Other (See Comments)    Vascular spasms   Nsaids Other (See Comments)    PT UNABLE TO TOLERATE NSAID'S DRUGS DUE TO HX OF GASTRIC SLEEVE SURGERY   Codeine Nausea Only   Promethazine Hcl Other (See Comments)    Restless leg feeling all over body   Rosuvastatin Calcium Other (See Comments)    Leg/muscle pain   Statins Other (See Comments)    Leg/muscle pain   Sulfonamide Derivatives Hives    Childhood allergy    Vicodin [Hydrocodone-Acetaminophen] Nausea And Vomiting    Family History  Problem Relation Age of Onset   Diabetes Father    Osteoarthritis Father    Heart disease Father    Ulcers Father    Stroke Mother    Suicidality Sister      Prior to Admission medications   Medication Sig Start Date End Date Taking? Authorizing Provider  amLODipine (NORVASC) 5 MG tablet Take 5 mg by mouth at bedtime.  12/24/16   [provider]  ARIPiprazole (ABILIFY) 5 MG tablet Take 1 tablet (5 mg total) by mouth daily. 12/24/17   Norman Clay, MD  Cholecalciferol (VITAMIN D3 PO) Take 1,000 Units by mouth daily.    [provider]  clonazePAM (KLONOPIN) 0.5 MG tablet Take 0.5 mg by mouth at bedtime as needed for anxiety. Up to 3 times daily as needed for anxiety, take 1 tablet (0.5 mg) scheduled each night at bedtime. 07/01/14   [provider]  Cyanocobalamin (VITAMIN B 12 PO) Take 1,000 mcg by mouth daily.    [provider]  cyclobenzaprine (FLEXERIL) 10 MG tablet Take 10 mg by mouth See admin instructions. Take 1 tablet (10 mg) every night at bedtime, may take an additional tablet during the day as needed for muscle spasms 07/15/14   [provider]  fenofibrate 54 MG tablet Take 54 mg by mouth daily with supper. 06/20/17   [provider]  fentaNYL (DURAGESIC) 12 MCG/HR Place 1 patch onto the skin every 3 (three) days. 06/21/18  [provider]  FLUoxetine (PROZAC) 20 MG capsule Take 60 mg by mouth at bedtime.  06/28/17   [provider]  furosemide (LASIX) 20 MG tablet Take 15 mg by mouth daily. Take 2 tablets (40 mg) in the morning & 1 tablet (20 mg) with lunch.     [provider]  gabapentin (NEURONTIN) 100 MG capsule Take 100 mg by mouth See admin instructions. Take up to 3 times daily as needed for neuropathy--takes scheduled in the morning,evening & at night.    [provider]  LANTUS 100 UNIT/ML injection Inject 35 Units into the skin 2 (two) times daily. Based on sliding scale  06/28/17   [provider]  MAGNESIUM PO Take 1 tablet by mouth daily with lunch.    [provider]  metoprolol (LOPRESSOR) 50 MG tablet Take 50 mg by mouth 3 (three) times daily.  06/28/14   [provider]  morphine (MS CONTIN) 15 MG 12 hr tablet Take 15 mg by mouth 2 (two) times daily. 07/03/18   [provider]  MOVANTIK 25 MG TABS tablet Take 25 mg by mouth daily. 07/09/18   [provider]  NARCAN 4 MG/0.1ML LIQD nasal spray kit Place 1 Dose into the nose once as needed for other. Over dose 07/05/18   [provider]  NOVOLOG 100 UNIT/ML injection Inject 10 Units into the skin 2 (two) times daily.  12/01/17   [provider]  omeprazole (PRILOSEC) 20 MG capsule Take 20 mg by mouth daily with supper.     [provider]  ondansetron (ZOFRAN) 4 MG tablet Take 4 mg by mouth 2 (two) times daily as needed.  For nausea/vomiting prevention with pain medication. 06/02/17   [provider]  oxyCODONE (OXY IR/ROXICODONE) 5 MG immediate release tablet Take 5-10 mg by mouth every 4 (four) hours as needed for moderate pain. 07/03/18   [provider]  potassium chloride SA (K-DUR,KLOR-CON) 20 MEQ tablet Take 20 mEq by mouth daily with lunch. 07/02/17   [provider]  PREVIDENT 5000 DRY MOUTH 1.1 % GEL dental gel Take 1 application by mouth daily. 02/08/18   [provider]  propylthiouracil (PTU) 50 MG tablet Take 75 mg by mouth daily.  12/22/16   [provider]  rOPINIRole (REQUIP) 2 MG tablet Take 3 mg by mouth at bedtime.     [provider]  vancomycin IVPB Inject 1,000 mg into the vein daily. Indication:  MRSE bacteremia and discitis  Last Day of Therapy:  07/16/2018 Labs - Sunday/Monday:  CBC/D, BMP, and vancomycin trough. Labs - Thursday:  BMP and vancomycin trough Labs - Every other week:  ESR and CRP 06/06/18 07/16/18  Elodia Florence., MD    Physical Exam: Wt Readings from Last 3 Encounters:  07/11/18 90.7 kg  07/11/18 90.3 kg  06/02/18 108.8 kg   Vitals:   07/11/18 1218 07/11/18 1300 07/11/18 1548  BP: (!) 162/64 (!) 132/39 (!) 143/94  Pulse: 97 94 95  Resp: _0 Temp: 98.5 F (36.9 C)    TempSrc: Oral    SpO2: 95% 93% 96%  Weight: 90.7 kg    Height: 5' 1" (1.549 m)        Constitutional:  Calm & comfortable Eyes: PERRLA, lids and conjunctivae normal ENT:  Mucous membranes are moist.  Pharynx clear of exudate   Normal dentition.  Neck: Supple, no masses  Respiratory:  Clear to auscultation bilaterally  Normal respiratory  effort.  Cardiovascular:  S1 & S2 heard, regular rate and rhythm No Murmurs Abdomen:  Non distended No tenderness, No masses Bowel sounds normal Extremities:  No clubbing / cyanosis No pedal edema No joint deformity    Skin:  No rashes, lesions or ulcers Neurologic:  AAO x 3 CN  2-12 grossly intact Sensation intact Strength 5/5 in all 4 extremities Psychiatric:  Normal Mood and affect    Labs on Admission: I have personally reviewed following labs and imaging studies  CBC: Recent Labs  Lab 07/11/18 1257  WBC 3.6*  NEUTROABS 2.4  HGB 9.8*  HCT 32.1*  MCV 79.9*  PLT 629   Basic Metabolic Panel: Recent Labs  Lab 07/11/18 1257  NA 136  K 4.2  CL 94*  CO2 29  GLUCOSE 148*  BUN 14  CREATININE 0.93  CALCIUM 9.7   GFR: Estimated Creatinine Clearance: 61.1 mL/min (by C-G formula based on SCr of 0.93 mg/dL). Liver Function Tests: Recent Labs  Lab 07/11/18 1257  AST 36  ALT 18  ALKPHOS 104  BILITOT 1.0  PROT 8.1  ALBUMIN 3.6   No results for input(s): LIPASE, AMYLASE in the last 168 hours. No results for input(s): AMMONIA in the last 168 hours. Coagulation Profile: No results for input(s): INR, PROTIME in the last 168 hours. Cardiac Enzymes: No results for input(s): CKTOTAL, CKMB, CKMBINDEX, TROPONINI in the last 168 hours. BNP (last 3 results) No results for input(s): PROBNP in the last 8760 hours. HbA1C: No results for input(s): HGBA1C in the last 72 hours. CBG: No results for input(s): GLUCAP in the last 168 hours. Lipid Profile: No results for input(s): CHOL, HDL, LDLCALC, TRIG, CHOLHDL, LDLDIRECT in the last 72 hours. Thyroid Function Tests: No results for input(s): TSH, T4TOTAL, FREET4, T3FREE, THYROIDAB in the last 72 hours. Anemia Panel: No results for input(s): VITAMINB12, FOLATE, FERRITIN, TIBC, IRON, RETICCTPCT in the last 72 hours. Urine analysis:    Component Value Date/Time   COLORURINE YELLOW 06/05/2018 1855   APPEARANCEUR CLOUDY (A) 06/05/2018 1855   LABSPEC 1.016 06/05/2018 1855   PHURINE 7.0 06/05/2018 1855   GLUCOSEU NEGATIVE 06/05/2018 1855   HGBUR MODERATE (A) 06/05/2018 1855   BILIRUBINUR NEGATIVE 06/05/2018 1855   KETONESUR NEGATIVE 06/05/2018 1855   PROTEINUR 30 (A) 06/05/2018 1855   UROBILINOGEN 0.2  07/20/2014 1707   NITRITE NEGATIVE 06/05/2018 1855   LEUKOCYTESUR LARGE (A) 06/05/2018 1855   Sepsis Labs: '@LABRCNTIP'$ (procalcitonin:4,lacticidven:4) )No results found for this or any previous visit (from the past 240 hour(s)).   Radiological Exams on Admission: Mr Lumbar Spine W Wo Contrast  Result Date: 07/11/2018 CLINICAL DATA:  Spinal infection.  Increased back pain. EXAM: MRI LUMBAR SPINE WITHOUT AND WITH CONTRAST TECHNIQUE: Multiplanar and multiecho pulse sequences of the lumbar spine were obtained without and with intravenous contrast. CONTRAST:  10 mL Gadovist IV COMPARISON:  MRI lumbar spine 06/02/2018 FINDINGS: Segmentation:  Image quality degraded by significant motion Normal segmentation Alignment: Approximately 5 mm retrolisthesis L2-3 unchanged. Slight anterolisthesis L5-S1. Vertebrae: Progressive discitis and osteomyelitis at L1-2. Increased fluid in the disc space with endplate erosive changes which have progressed significantly. Diffuse bone marrow edema and enhancement throughout L1 and L2 vertebral bodies. No other levels of infection in the spine. Conus medullaris and cauda equina: Conus extends to the L1-2 level. Conus and cauda equina appear normal. Paraspinal and other soft tissues: Extensive paraspinous soft tissue thickening and enhancement around the L1-2 disc space compatible with infection in the soft tissues. 1 cm  anterior paraspinous soft tissue abscesses have become more well-defined. Left psoas abscess measuring 17 mm has become more well-defined and enlarged. Diffuse myositis with enhancement throughout the psoas muscle bilaterally also with progression. Probable myositis in the erector spinae muscles bilaterally which shows diffuse enhancement. Multilocular cystic mass in the uncinate process of the pancreas measuring approximately 25 x 30 mm unchanged from prior studies. See MRI pancreas report 2018/06/22. Disc levels: T12-L1: Negative disc space. Ventral epidural fluid  collection extending from T11 through L1 has improved in the interval although today's study is degraded by significant motion. L1-2: Progression of discitis and osteomyelitis with progressive endplate destruction. 4 mm ventral epidural abscess in the midline appears similar to the prior study. Diffuse dural thickening and enhancement at L1 and L2 likely has progressed although the prior study was not done with intravenous contrast. Nonenhancing extradural fluid collection posteriorly on the left has progressed. L2-3: 5 mm retrolisthesis. Advanced disc degeneration with disc bulging and spurring. Bilateral facet degeneration. Severe spinal stenosis unchanged from the prior study. Extradural fluid collection on the left of the thecal sac at L2-3 has progressed and may represent abscess or reactive effusion. L3-4: Disc bulging and spurring. Bilateral facet degeneration. Mild spinal stenosis L4-5: Mild anterolisthesis. Disc and facet degeneration with mild spinal stenosis L5-S1: Mild anterolisthesis with moderate facet degeneration. Moderate subarticular stenosis bilaterally. IMPRESSION: 1. Image quality degraded by significant motion 2. Progression of discitis and osteomyelitis at L1-2. Progressive fluid/pus in the disc space extending into the anterior paraspinous soft tissues compatible with progressive abscess. Progressive abscess in the left psoas muscle. Progressive myositis throughout the psoas muscles bilaterally and the erector spinae muscles bilaterally. Posterior extradural fluid collection on the left at L1-2 has progressed in the interval and may represent abscess. Diffuse dural thickening and small ventral epidural abscess at L1-2 difficult to compare as intravenous contrast not given on the prior study. No new levels of infection 3. Multilevel spondylosis with severe spinal stenosis at L2-3 due to degenerative change. Electronically Signed   By: Franchot Gallo M.D.   On: 07/11/2018 15:04     Assessment/Plan Principal Problem: Abscess in epidural space of lumbar spine Myositis in b/l psoas muscles - have contacted IR for drainage- they would like input from ID and Neurosurgery - I have subsequently called Dr Linus Salmons and Dr Ellene Route - hold off on antibiotics for today - oral and IV medications ordered for pain control - f/u blood cultures  Active Problems:   DM (diabetes mellitus) 2 - cont Lantus at lower than home dose - start SSI TID AC  HTN - cont Amlodipine and Lopressor    Obesity Body mass index is 37.79 kg/m.    Graves disease  - cont PTU   DVT prophylaxis: SCDS until abscess is drained Code Status: Full code  Family Communication:   Disposition Plan: home Consults called: IR Admission status: inpatient    Debbe Odea MD [facility] Pager: [email or web address] Password [email or web address] 7PM-7AM, please contact night-coverage   07/11/2018, 4:02 PM

## 2018-07-11 NOTE — Patient Instructions (Signed)
Repeat office visit with Dr. Ardis Hughs in 6 to 7 weeks, hopefully in person.

## 2018-07-11 NOTE — Consult Note (Signed)
[facility]       Reason for Consult: discitis and abscess    Referring Physician: Dr. Wynelle Cleveland  Principal Problem:   Abscess in epidural space of lumbar spine Active Problems:   DM (diabetes mellitus) ([place])   Hypothyroidism   Obesity   Graves disease   . amLODipine  5 mg Oral QHS  . ARIPiprazole  5 mg Oral Daily  . clonazePAM  0.5 mg Oral QHS  . fenofibrate  54 mg Oral Q supper  . fentaNYL  1 patch Transdermal Q72H  . FLUoxetine  60 mg Oral QHS  . gabapentin  100 mg Oral 3 times per day  . insulin aspart  0-5 Units Subcutaneous QHS  . insulin aspart  0-9 Units Subcutaneous TID WC  . insulin glargine  20 Units Subcutaneous BID  . metoprolol tartrate  50 mg Oral TID  . pantoprazole  40 mg Oral Daily  . polyethylene glycol  17 g Oral Daily  . propylthiouracil  75 mg Oral Daily  . rOPINIRole  3 mg Oral QHS  . sodium chloride flush  3 mL Intravenous Q12H    Recommendations: Drainage of abscess  Hold antibiotics pending drainage for culture Start vancomycin after drainage  Assessment: She has progressive infection with enlarging left psoas abscess, epidural abscess and progressive discitis/osteomyleitis with worsening pain.  She will need source control.  Would also reculture with the off chance it is a different organism, but likely just needs source control.    Antibiotics: vancomycin  HPI: [name] is a 68 y.o. female hospitalized last month with MRSE lumbar discitis with progressive pain and MRI findings as described above.  Had positive blood culture and aspiration culture with the same. She has had chronic leukopenia and has been on vancomycin.  She was having more pain and less able to move around.   Did have a fever up to about 100.5.  No associated chills.     Review of Systems:  Constitutional: negative for chills, malaise and anorexia Respiratory: negative for cough or sputum Gastrointestinal: negative for nausea and  diarrhea All other systems reviewed and are negative    Past Medical History:  Diagnosis Date  . Anemia   . Arthritis   . Asthma    OCCAS  . Bell's palsy   . Broken neck ([place])    C-1  . Carbuncle and furuncle of trunk   . Chronic dislocation of right shoulder   . Depression   . Disorder of fascia    HX OF NECROTIC FASCITIS AFTER ABDOMINAL SURGERY FOR HERNIA- REQUIRED 19 SURGERIES AND 2.5 MONTH HOSPITALIZATION AT [facility]  . DM (diabetes mellitus) ([place])   . Dysrhythmia    HX OF TACHYCARDIA AND BRADYCARDIA - ON GOING FOR YEARS - DOES NOT HAVE TO SEE CARDIOLOGIST  . Elevated cholesterol   . Fibromyalgia   . Fracture MAY 2014   HX OF FRACTURED NECK C1- CAUSES SEVERE HEADACHES--LIMITED ROM NECK  . Frequent infections    ESPECIALLY PRONE TO INFECTIONS AFTER SURGERIES  . Graves disease   . Heart murmur    DOES NOT CAUSE ANY PROBLEMS  . History of kidney stones   . Hyperlipidemia   . Hypertension   . Hypothyroidism    GRAVES DISEASE  . Migraine   . 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 Neuropathy   . Obesity   . OSA (obstructive sleep apnea)  USES CPAP - DOES NOT KNOW SETTING  . Pain    LEFT SHOULDER  PAIN -HARD TO LIE ON LEFT SIDE FOR LONG PERIOD;  PAIN IN LOWER BADK - 3 HERNIATED DISCS AND STENOISIS -  . PONV (postoperative nausea and vomiting)    THE GAS MAKES ME NAUSEATED  . Restless leg syndrome   . Sciatica   . Tachycardia   . Urinary frequency   . Urticaria   . UTI (urinary tract infection)     Social History   Tobacco Use  . Smoking status: Never Smoker  . Smokeless tobacco: Never Used  Substance Use Topics  . Alcohol use: No  . Drug use: No    Family History  Problem Relation Age of Onset  . Diabetes Father   . Osteoarthritis Father   . Heart disease Father   . Ulcers Father   . Stroke Mother   . Suicidality Sister     Allergies  Allergen Reactions  . Imitrex [Sumatriptan] Other (See Comments)    Vascular spasms  .  Nsaids Other (See Comments)    PT UNABLE TO TOLERATE NSAID'S DRUGS DUE TO HX OF GASTRIC SLEEVE SURGERY  . Codeine Nausea Only  . Promethazine Hcl Other (See Comments)    Restless leg feeling all over body  . Rosuvastatin Calcium Other (See Comments)    Leg/muscle pain  . Statins Other (See Comments)    Leg/muscle pain  . Sulfonamide Derivatives Hives    Childhood allergy   . Vicodin [Hydrocodone-Acetaminophen] Nausea And Vomiting    Physical Exam: Constitutional: in no apparent distress  Vitals:   07/11/18 1600 07/11/18 1629  BP: 136/67 (!) 126/57  Pulse: 95 91  Resp: 16 16  Temp:  98.4 F (36.9 C)  SpO2: 92% 100%   EYES: anicteric ENMT: no thrush Cardiovascular: Cor RRR Respiratory: CTA B, anterior exam; normal respiratory effort GI: Bowel sounds are normal, liver is not enlarged, spleen is not enlarged Musculoskeletal: no pedal edema noted Skin: negatives: no rash Hematologic: No cervical lad  Lab Results  Component Value Date   WBC 3.6 (L) 07/11/2018   HGB 9.8 (L) 07/11/2018   HCT 32.1 (L) 07/11/2018   MCV 79.9 (L) 07/11/2018   PLT 251 07/11/2018    Lab Results  Component Value Date   CREATININE 0.93 07/11/2018   BUN 14 07/11/2018   NA 136 07/11/2018   K 4.2 07/11/2018   CL 94 (L) 07/11/2018   CO2 29 07/11/2018    Lab Results  Component Value Date   ALT 18 07/11/2018   AST 36 07/11/2018   ALKPHOS 104 07/11/2018     Microbiology: No results found for this or any previous visit (from the past 240 hour(s)).  Thayer Headings, MD [facility] [email or web address] 07/11/2018, 4:58 PM
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
## 2018-07-11 NOTE — ED Triage Notes (Signed)
Patient complains of low back pain that radiates to the pelvis. Back pain has been on going due to bone infection. Today she had a flair up around 3am. When trying to sit up she has spasms in the back. She tried taking Morphine but it was not helpful.    EVS vitals and CBG: 138 palpated BP 100 HR 97.9 Temp 175 CBG

## 2018-07-11 NOTE — Consult Note (Signed)
Reason for Consult: Osteomyelitis and discitis L1-L2 Referring Physician: Dr. Fredric Mare is an 68 y.o. female.  HPI: Patient is a 68 year old individual who was diagnosed with a discitis back in March 13 of this year.  Staph epi was recovered at that time the patient was started and has been maintained on vancomycin.  Despite 5 weeks of treatment she has had worsening back pain and a recent MRI today demonstrates that there is been progressive degeneration of the disc space with an even more prominent osteomyelitis and epidural abscess at the level of L1-L2.  Consultation was requested to see if surgical intervention was required.  Past Medical History:  Diagnosis Date  . Anemia   . Arthritis   . Asthma    OCCAS  . Bell's palsy   . Broken neck ([place])    C-1  . Carbuncle and furuncle of trunk   . Chronic dislocation of right shoulder   . Depression   . Disorder of fascia    HX OF NECROTIC FASCITIS AFTER ABDOMINAL SURGERY FOR HERNIA- REQUIRED 19 SURGERIES AND 2.5 MONTH HOSPITALIZATION AT [facility]  . DM (diabetes mellitus) ([place])   . Dysrhythmia    HX OF TACHYCARDIA AND BRADYCARDIA - ON GOING FOR YEARS - DOES NOT HAVE TO SEE CARDIOLOGIST  . Elevated cholesterol   . Fibromyalgia   . Fracture MAY 2014   HX OF FRACTURED NECK C1- CAUSES SEVERE HEADACHES--LIMITED ROM NECK  . Frequent infections    ESPECIALLY PRONE TO INFECTIONS AFTER SURGERIES  . Graves disease   . Heart murmur    DOES NOT CAUSE ANY PROBLEMS  . History of kidney stones   . Hyperlipidemia   . Hypertension   . Hypothyroidism    GRAVES DISEASE  . Migraine   . 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 Neuropathy   . Obesity   . OSA (obstructive sleep apnea)    USES CPAP - DOES NOT KNOW SETTING  . Pain    LEFT SHOULDER  PAIN -HARD TO LIE ON LEFT SIDE FOR LONG PERIOD;  PAIN IN LOWER BADK - 3 HERNIATED DISCS AND STENOISIS -  . PONV (postoperative nausea and vomiting)    THE GAS MAKES ME  NAUSEATED  . Restless leg syndrome   . Sciatica   . Tachycardia   . Urinary frequency   . Urticaria   . UTI (urinary tract infection)     Past Surgical History:  Procedure Laterality Date  . ABDOMINAL HYSTERECTOMY     LARGE TUMOR AT OVARY REMOVED  . APPENDECTOMY    . CARPAL TUNNEL RELEASE     BILATERAL  . CATARACT EXTRACTION W/ INTRAOCULAR LENS  IMPLANT, BILATERAL    . CESAREAN SECTION     X 3  . CHOLECYSTECTOMY    . CYSTOSCOPY WITH URETEROSCOPY Right 12/03/2013   Procedure: CYSTOSCOPY WITH RIGHT RETROGRADE URETEROSCOPY LASER LITHOTRIPSY RIGHT STONE RIGHT URETERAL STENT, ;  Surgeon: Raynelle Bring, MD;  [facility];  Service: Urology;  Laterality: Right;  PROCEDURE WAS ORIGINALLY SCHEDULED AS RIGHT PERCUTANEOUS NEPHROLITHOTOMY  . EYELID LACERATION REPAIR     RIGHT EYE  . HERNIA REPAIR     ABDOMINAL HERNIA REPAIR WITH MESH - 3 SURGERIES   . HX OF 19 SURGERIES FOR NECROTIC FASCITIS     SKIN GRAFTS+ WOUND  VAC  . I&D OF INFECTED SITE IN [facility] - FROM AN INJECTION    . IR FLUORO GUIDED  NEEDLE PLC ASPIRATION/INJECTION LOC  06/03/2018  . JOINT REPLACEMENT     TOTAL RIGHT KNEE REPLACEMENT  . KNEE ARTHROSCOPY  RIGHT AND LEFT   X 2  . [place] RESECTION  2014  . NEPHROLITHOTOMY Right 12/10/2013   Procedure: NEPHROLITHOTOMY PERCUTANEOUS;  Surgeon: Raynelle Bring, MD;  [facility];  Service: Urology;  Laterality: Right;  . REVERSE SHOULDER ARTHROPLASTY Right 07/21/2017  . REVERSE SHOULDER ARTHROPLASTY Right 07/21/2017   Procedure: RIGHT REVERSE SHOULDER ARTHROPLASTY;  Surgeon: Tania Ade, MD;  Location: [facility];  Service: Orthopedics;  Laterality: Right;  . RIGHT FOOT DRAINAGE OF INFECTION    . shoulder arthroscopy Right    X 2  . TONSILLECTOMY     AND ADENOIDECTOMY    Family History  Problem Relation Age of Onset  . Diabetes Father   . Osteoarthritis Father   . Heart disease Father   . Ulcers Father   . Stroke Mother   . Suicidality Sister      Social History:  reports that she has never smoked. She has never used smokeless tobacco. She reports that she does not drink alcohol or use drugs.  Allergies:  Allergies  Allergen Reactions  . Imitrex [Sumatriptan] Other (See Comments)    Vascular spasms  . Nsaids Other (See Comments)    PT UNABLE TO TOLERATE NSAID'S DRUGS DUE TO HX OF GASTRIC SLEEVE SURGERY  . Codeine Nausea Only  . Promethazine Hcl Other (See Comments)    Restless leg feeling all over body  . Rosuvastatin Calcium Other (See Comments)    Leg/muscle pain  . Statins Other (See Comments)    Leg/muscle pain  . Sulfonamide Derivatives Hives    Childhood allergy   . Vicodin [Hydrocodone-Acetaminophen] Nausea And Vomiting    Medications: I have reviewed the patient's current medications.  Results for orders placed or performed during the hospital encounter of 07/11/18 (from the past 48 hour(s))  CBC with Differential     Status: Abnormal   Collection Time: 07/11/18 12:57 PM  Result Value Ref Range   WBC 3.6 (L) 4.0 - 10.5 K/uL   RBC 4.02 3.87 - 5.11 MIL/uL   Hemoglobin 9.8 (L) 12.0 - 15.0 g/dL   HCT 32.1 (L) 36.0 - 46.0 %   MCV 79.9 (L) 80.0 - 100.0 fL   MCH 24.4 (L) 26.0 - 34.0 pg   MCHC 30.5 30.0 - 36.0 g/dL   RDW 16.2 (H) 11.5 - 15.5 %   Platelets 251 150 - 400 K/uL   nRBC 0.0 0.0 - 0.2 %   Neutrophils Relative % 65 %   Neutro Abs 2.4 1.7 - 7.7 K/uL   Lymphocytes Relative 21 %   Lymphs Abs 0.8 0.7 - 4.0 K/uL   Monocytes Relative 13 %   Monocytes Absolute 0.5 0.1 - 1.0 K/uL   Eosinophils Relative 0 %   Eosinophils Absolute 0.0 0.0 - 0.5 K/uL   Basophils Relative 0 %   Basophils Absolute 0.0 0.0 - 0.1 K/uL   Immature Granulocytes 1 %   Abs Immature Granulocytes 0.02 0.00 - 0.07 K/uL    Comment: Performed at [facility], [place] [address] [place], [place] [address]  Comprehensive metabolic panel     Status: Abnormal   Collection Time: 07/11/18 12:57 PM  Result Value Ref Range    Sodium 136 135 - 145 mmol/L   Potassium 4.2 3.5 - 5.1 mmol/L   Chloride 94 (L) 98 - 111 mmol/L   CO2 29  22 - 32 mmol/L   Glucose, Bld 148 (H) 70 - 99 mg/dL   BUN 14 8 - 23 mg/dL   Creatinine, Ser 0.93 0.44 - 1.00 mg/dL   Calcium 9.7 8.9 - 10.3 mg/dL   Total Protein 8.1 6.5 - 8.1 g/dL   Albumin 3.6 3.5 - 5.0 g/dL   AST 36 15 - 41 U/L   ALT 18 0 - 44 U/L   Alkaline Phosphatase 104 38 - 126 U/L   Total Bilirubin 1.0 0.3 - 1.2 mg/dL   GFR calc non Af Amer >60 >60 mL/min   GFR calc Af Amer >60 >60 mL/min   Anion gap 13 5 - 15    Comment: Performed at [facility], [place] [address], [place], [place] [address]    Mr Lumbar Spine W Wo Contrast  Result Date: 07/11/2018 CLINICAL DATA:  Spinal infection.  Increased back pain. EXAM: MRI LUMBAR SPINE WITHOUT AND WITH CONTRAST TECHNIQUE: Multiplanar and multiecho pulse sequences of the lumbar spine were obtained without and with intravenous contrast. CONTRAST:  10 mL Gadovist IV COMPARISON:  MRI lumbar spine 06/02/2018 FINDINGS: Segmentation:  Image quality degraded by significant motion Normal segmentation Alignment: Approximately 5 mm retrolisthesis L2-3 unchanged. Slight anterolisthesis L5-S1. Vertebrae: Progressive discitis and osteomyelitis at L1-2. Increased fluid in the disc space with endplate erosive changes which have progressed significantly. Diffuse bone marrow edema and enhancement throughout L1 and L2 vertebral bodies. No other levels of infection in the spine. Conus medullaris and cauda equina: Conus extends to the L1-2 level. Conus and cauda equina appear normal. Paraspinal and other soft tissues: Extensive paraspinous soft tissue thickening and enhancement around the L1-2 disc space compatible with infection in the soft tissues. 1 cm anterior paraspinous soft tissue abscesses have become more well-defined. Left psoas abscess measuring 17 mm has become more well-defined and enlarged. Diffuse myositis with enhancement  throughout the psoas muscle bilaterally also with progression. Probable myositis in the erector spinae muscles bilaterally which shows diffuse enhancement. Multilocular cystic mass in the uncinate process of the pancreas measuring approximately 25 x 30 mm unchanged from prior studies. See MRI pancreas report 07-08-2018. Disc levels: T12-L1: Negative disc space. Ventral epidural fluid collection extending from T11 through L1 has improved in the interval although today's study is degraded by significant motion. L1-2: Progression of discitis and osteomyelitis with progressive endplate destruction. 4 mm ventral epidural abscess in the midline appears similar to the prior study. Diffuse dural thickening and enhancement at L1 and L2 likely has progressed although the prior study was not done with intravenous contrast. Nonenhancing extradural fluid collection posteriorly on the left has progressed. L2-3: 5 mm retrolisthesis. Advanced disc degeneration with disc bulging and spurring. Bilateral facet degeneration. Severe spinal stenosis unchanged from the prior study. Extradural fluid collection on the left of the thecal sac at L2-3 has progressed and may represent abscess or reactive effusion. L3-4: Disc bulging and spurring. Bilateral facet degeneration. Mild spinal stenosis L4-5: Mild anterolisthesis. Disc and facet degeneration with mild spinal stenosis L5-S1: Mild anterolisthesis with moderate facet degeneration. Moderate subarticular stenosis bilaterally. IMPRESSION: 1. Image quality degraded by significant motion 2. Progression of discitis and osteomyelitis at L1-2. Progressive fluid/pus in the disc space extending into the anterior paraspinous soft tissues compatible with progressive abscess. Progressive abscess in the left psoas muscle. Progressive myositis throughout the psoas muscles bilaterally and the erector spinae muscles bilaterally. Posterior extradural fluid collection on the left at L1-2 has progressed in  the interval and may  represent abscess. Diffuse dural thickening and small ventral epidural abscess at L1-2 difficult to compare as intravenous contrast not given on the prior study. No new levels of infection 3. Multilevel spondylosis with severe spinal stenosis at L2-3 due to degenerative change. Electronically Signed   By: Franchot Gallo M.D.   On: 07/11/2018 15:04    Review of Systems  Constitutional: Negative.   HENT: Negative.   Eyes: Negative.   Respiratory: Negative.   Cardiovascular: Negative.   Gastrointestinal: Negative.   Genitourinary: Negative.   Musculoskeletal: Positive for back pain.  Skin: Negative.   Neurological: Positive for tingling and weakness.  Endo/Heme/Allergies: Negative.   Psychiatric/Behavioral: Negative.    Blood pressure (!) 126/57, pulse 91, temperature 98.4 F (36.9 C), temperature source Oral, resp. rate 16, height 5\' 1"  (1.549 m), weight 90.7 kg, SpO2 100 %. Physical Exam  Constitutional: She is oriented to person, place, and time. She appears well-developed and well-nourished.  Eyes: Pupils are equal, round, and reactive to light. Conjunctivae and EOM are normal.  Neck: Normal range of motion. Neck supple.  Musculoskeletal:     Comments: Positive straight leg raising at 15 degrees bilaterally  Neurological: She is alert and oriented to person, place, and time.  Absent bilateral lower extremity reflexes.  Significant pain with flexion in the hips and extension of the lower extremities with difficulty raising lower extremity secondary to back pain  Skin: Skin is warm and dry.  Psychiatric: She has a normal mood and affect. Her behavior is normal. Judgment and thought content normal.    Assessment/Plan: Persistent osteomyelitis and discitis at L1-L2 with epidural abscess formation.  Plan in light of the patient's continued good neurologic function I would advise that repeat needle biopsy of the L1-L2 disc space and aspirating some pus from that area  to reculture this patient would be preferable  to doing an open surgical decompression.  Blanchie Dessert Geral Coker 07/11/2018, 4:34 PM

## 2018-07-11 NOTE — ED Notes (Signed)
ED TO INPATIENT HANDOFF REPORT  ED Nurse Name and Phone #: [phone]  S Name/Age/Gender [name] 68 y.o. female Room/Bed: WA22/WA22  Code Status   Code Status: Full Code  Home/SNF/Other Home Patient oriented to: self, place, time and situation Is this baseline? Yes   Triage Complete: Triage complete  Chief Complaint back pain radiating to pelvis  Triage Note Patient complains of low back pain that radiates to the pelvis. Back pain has been on going due to bone infection. Today she had a flair up around 3am. When trying to sit up she has spasms in the back. She tried taking Morphine but it was not helpful.    EVS vitals and CBG: 138 palpated BP 100 HR 97.9 Temp 175 CBG    Allergies Allergies  Allergen Reactions  . Imitrex [Sumatriptan] Other (See Comments)    Vascular spasms  . Nsaids Other (See Comments)    PT UNABLE TO TOLERATE NSAID'S DRUGS DUE TO HX OF GASTRIC SLEEVE SURGERY  . Codeine Nausea Only  . Promethazine Hcl Other (See Comments)    Restless leg feeling all over body  . Rosuvastatin Calcium Other (See Comments)    Leg/muscle pain  . Statins Other (See Comments)    Leg/muscle pain  . Sulfonamide Derivatives Hives    Childhood allergy   . Vicodin [Hydrocodone-Acetaminophen] Nausea And Vomiting    Level of Care/Admitting Diagnosis ED Disposition    ED Disposition Condition Comment   Admit  Hospital Area: [facility] [[id number]] Level of Care: Med-Surg [16] Covid Evaluation: N/A Diagnosis: Abscess in epidural space of lumbar spine [[id number]] Admitting Physician: [facility] [place] Attending Physician: Debbe Odea  [[id number]] Estimated length of stay: past midnight tomorrow Certification:: I certify this patient will need inpatient services for at least 2 midnights PT Class (Do Not Modify): Inpatient [101] PT Acc Code (Do Not Modify): Private [1]       B Medical/Surgery History Past Medical History:  Diagnosis Date  .  Anemia   . Arthritis   . Asthma    OCCAS  . Bell's palsy   . Broken neck ([place])    C-1  . Carbuncle and furuncle of trunk   . Chronic dislocation of right shoulder   . Depression   . Disorder of fascia    HX OF NECROTIC FASCITIS AFTER ABDOMINAL SURGERY FOR HERNIA- REQUIRED 19 SURGERIES AND 2.5 MONTH HOSPITALIZATION AT [facility]  . DM (diabetes mellitus) ([place])   . Dysrhythmia    HX OF TACHYCARDIA AND BRADYCARDIA - ON GOING FOR YEARS - DOES NOT HAVE TO SEE CARDIOLOGIST  . Elevated cholesterol   . Fibromyalgia   . Fracture MAY 2014   HX OF FRACTURED NECK C1- CAUSES SEVERE HEADACHES--LIMITED ROM NECK  . Frequent infections    ESPECIALLY PRONE TO INFECTIONS AFTER SURGERIES  . Graves disease   . Heart murmur    DOES NOT CAUSE ANY PROBLEMS  . History of kidney stones   . Hyperlipidemia   . Hypertension   . Hypothyroidism    GRAVES DISEASE  . Migraine   . 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 Neuropathy   . Obesity   . OSA (obstructive sleep apnea)    USES CPAP - DOES NOT KNOW SETTING  . Pain    LEFT SHOULDER  PAIN -HARD TO LIE ON LEFT SIDE FOR LONG PERIOD;  PAIN IN LOWER BADK - 3 HERNIATED DISCS AND STENOISIS -  .  PONV (postoperative nausea and vomiting)    THE GAS MAKES ME NAUSEATED  . Restless leg syndrome   . Sciatica   . Tachycardia   . Urinary frequency   . Urticaria   . UTI (urinary tract infection)    Past Surgical History:  Procedure Laterality Date  . ABDOMINAL HYSTERECTOMY     LARGE TUMOR AT OVARY REMOVED  . APPENDECTOMY    . CARPAL TUNNEL RELEASE     BILATERAL  . CATARACT EXTRACTION W/ INTRAOCULAR LENS  IMPLANT, BILATERAL    . CESAREAN SECTION     X 3  . CHOLECYSTECTOMY    . CYSTOSCOPY WITH URETEROSCOPY Right 12/03/2013   Procedure: CYSTOSCOPY WITH RIGHT RETROGRADE URETEROSCOPY LASER LITHOTRIPSY RIGHT STONE RIGHT URETERAL STENT, ;  Surgeon: Raynelle Bring, MD;  Location: [facility];  Service: Urology;  Laterality: Right;  PROCEDURE WAS  ORIGINALLY SCHEDULED AS RIGHT PERCUTANEOUS NEPHROLITHOTOMY  . EYELID LACERATION REPAIR     RIGHT EYE  . HERNIA REPAIR     ABDOMINAL HERNIA REPAIR WITH MESH - 3 SURGERIES   . HX OF 19 SURGERIES FOR NECROTIC FASCITIS     SKIN GRAFTS+ WOUND  VAC  . I&D OF INFECTED SITE IN [place] - FROM AN INJECTION    . IR FLUORO GUIDED NEEDLE PLC ASPIRATION/INJECTION LOC  06/03/2018  . JOINT REPLACEMENT     TOTAL RIGHT KNEE REPLACEMENT  . KNEE ARTHROSCOPY  RIGHT AND LEFT   X 2  . [place] RESECTION  2014  . NEPHROLITHOTOMY Right 12/10/2013   Procedure: NEPHROLITHOTOMY PERCUTANEOUS;  Surgeon: Raynelle Bring, MD;  Location: [facility];  Service: Urology;  Laterality: Right;  . REVERSE SHOULDER ARTHROPLASTY Right 07/21/2017  . REVERSE SHOULDER ARTHROPLASTY Right 07/21/2017   Procedure: RIGHT REVERSE SHOULDER ARTHROPLASTY;  Surgeon: Tania Ade, MD;  Location: [facility];  Service: Orthopedics;  Laterality: Right;  . RIGHT FOOT DRAINAGE OF INFECTION    . shoulder arthroscopy Right    X 2  . TONSILLECTOMY     AND ADENOIDECTOMY     A IV Location/Drains/Wounds Patient Lines/Drains/Airways Status   Active Line/Drains/Airways    Name:   Placement date:   Placement time:   Site:   Days:   Peripheral IV 07/11/18 Right Wrist   07/11/18    1253    Wrist   less than 1   PICC Single Lumen 98/92/11 PICC Right Basilic 44 cm 0 cm   94/17/40    8144    Basilic   35   CentriMag LVAD   06/04/18    2100    -   37   Nephrostomy Right 22 Fr.   12/10/13    1720    Right   1674   Ureteral Drain/Stent Right ureter 6 Fr.   12/03/13    1439    Right ureter   1681   External Urinary Catheter   07/11/18    1538    -   less than 1   Incision (Closed) 12/10/13 Back Right   12/10/13    1735     1674   Incision (Closed) 07/21/17 Shoulder Right   07/21/17    0940     355   Incision (Closed) 06/03/18 Back Lower   06/03/18    1813     38   Wound 08/15/12 Abrasion(s) Knee Left   08/15/12    1142    Knee   2156   Wound  08/15/12 Abrasion(s) Face Left abrasion to left  lower cheek   08/15/12    1143    Face   2156   Wound 08/15/12 Other (Comment) Leg Lower;Right contusion   08/15/12    1142    Leg   2156   Wound 08/15/12 Abdomen Left   08/15/12    2000    Abdomen   2156   Wound 08/15/12 Abdomen Right   08/15/12    2000    Abdomen   2156          Intake/Output Last 24 hours No intake or output data in the 24 hours ending 07/11/18 1614  Labs/Imaging Results for orders placed or performed during the hospital encounter of 07/11/18 (from the past 48 hour(s))  CBC with Differential     Status: Abnormal   Collection Time: 07/11/18 12:57 PM  Result Value Ref Range   WBC 3.6 (L) 4.0 - 10.5 K/uL   RBC 4.02 3.87 - 5.11 MIL/uL   Hemoglobin 9.8 (L) 12.0 - 15.0 g/dL   HCT 32.1 (L) 36.0 - 46.0 %   MCV 79.9 (L) 80.0 - 100.0 fL   MCH 24.4 (L) 26.0 - 34.0 pg   MCHC 30.5 30.0 - 36.0 g/dL   RDW 16.2 (H) 11.5 - 15.5 %   Platelets 251 150 - 400 K/uL   nRBC 0.0 0.0 - 0.2 %   Neutrophils Relative % 65 %   Neutro Abs 2.4 1.7 - 7.7 K/uL   Lymphocytes Relative 21 %   Lymphs Abs 0.8 0.7 - 4.0 K/uL   Monocytes Relative 13 %   Monocytes Absolute 0.5 0.1 - 1.0 K/uL   Eosinophils Relative 0 %   Eosinophils Absolute 0.0 0.0 - 0.5 K/uL   Basophils Relative 0 %   Basophils Absolute 0.0 0.0 - 0.1 K/uL   Immature Granulocytes 1 %   Abs Immature Granulocytes 0.02 0.00 - 0.07 K/uL    Comment: Performed at [facility], [place] [address], [place], [place] [address]  Comprehensive metabolic panel     Status: Abnormal   Collection Time: 07/11/18 12:57 PM  Result Value Ref Range   Sodium 136 135 - 145 mmol/L   Potassium 4.2 3.5 - 5.1 mmol/L   Chloride 94 (L) 98 - 111 mmol/L   CO2 29 22 - 32 mmol/L   Glucose, Bld 148 (H) 70 - 99 mg/dL   BUN 14 8 - 23 mg/dL   Creatinine, Ser 0.93 0.44 - 1.00 mg/dL   Calcium 9.7 8.9 - 10.3 mg/dL   Total Protein 8.1 6.5 - 8.1 g/dL   Albumin 3.6 3.5 - 5.0 g/dL   AST 36 15 - 41  U/L   ALT 18 0 - 44 U/L   Alkaline Phosphatase 104 38 - 126 U/L   Total Bilirubin 1.0 0.3 - 1.2 mg/dL   GFR calc non Af Amer >60 >60 mL/min   GFR calc Af Amer >60 >60 mL/min   Anion gap 13 5 - 15    Comment: Performed at [facility], [place] [address], [place],  [address]   Mr Lumbar Spine W Wo Contrast  Result Date: 07/11/2018 CLINICAL DATA:  Spinal infection.  Increased back pain. EXAM: MRI LUMBAR SPINE WITHOUT AND WITH CONTRAST TECHNIQUE: Multiplanar and multiecho pulse sequences of the lumbar spine were obtained without and with intravenous contrast. CONTRAST:  10 mL Gadovist IV COMPARISON:  MRI lumbar spine 06/02/2018 FINDINGS: Segmentation:  Image quality degraded by significant motion Normal segmentation Alignment: Approximately 5 mm retrolisthesis  L2-3 unchanged. Slight anterolisthesis L5-S1. Vertebrae: Progressive discitis and osteomyelitis at L1-2. Increased fluid in the disc space with endplate erosive changes which have progressed significantly. Diffuse bone marrow edema and enhancement throughout L1 and L2 vertebral bodies. No other levels of infection in the spine. Conus medullaris and cauda equina: Conus extends to the L1-2 level. Conus and cauda equina appear normal. Paraspinal and other soft tissues: Extensive paraspinous soft tissue thickening and enhancement around the L1-2 disc space compatible with infection in the soft tissues. 1 cm anterior paraspinous soft tissue abscesses have become more well-defined. Left psoas abscess measuring 17 mm has become more well-defined and enlarged. Diffuse myositis with enhancement throughout the psoas muscle bilaterally also with progression. Probable myositis in the erector spinae muscles bilaterally which shows diffuse enhancement. Multilocular cystic mass in the uncinate process of the pancreas measuring approximately 25 x 30 mm unchanged from prior studies. See MRI pancreas report 06-22-18. Disc levels: T12-L1: Negative  disc space. Ventral epidural fluid collection extending from T11 through L1 has improved in the interval although today's study is degraded by significant motion. L1-2: Progression of discitis and osteomyelitis with progressive endplate destruction. 4 mm ventral epidural abscess in the midline appears similar to the prior study. Diffuse dural thickening and enhancement at L1 and L2 likely has progressed although the prior study was not done with intravenous contrast. Nonenhancing extradural fluid collection posteriorly on the left has progressed. L2-3: 5 mm retrolisthesis. Advanced disc degeneration with disc bulging and spurring. Bilateral facet degeneration. Severe spinal stenosis unchanged from the prior study. Extradural fluid collection on the left of the thecal sac at L2-3 has progressed and may represent abscess or reactive effusion. L3-4: Disc bulging and spurring. Bilateral facet degeneration. Mild spinal stenosis L4-5: Mild anterolisthesis. Disc and facet degeneration with mild spinal stenosis L5-S1: Mild anterolisthesis with moderate facet degeneration. Moderate subarticular stenosis bilaterally. IMPRESSION: 1. Image quality degraded by significant motion 2. Progression of discitis and osteomyelitis at L1-2. Progressive fluid/pus in the disc space extending into the anterior paraspinous soft tissues compatible with progressive abscess. Progressive abscess in the left psoas muscle. Progressive myositis throughout the psoas muscles bilaterally and the erector spinae muscles bilaterally. Posterior extradural fluid collection on the left at L1-2 has progressed in the interval and may represent abscess. Diffuse dural thickening and small ventral epidural abscess at L1-2 difficult to compare as intravenous contrast not given on the prior study. No new levels of infection 3. Multilevel spondylosis with severe spinal stenosis at L2-3 due to degenerative change. Electronically Signed   By: Franchot Gallo M.D.    On: 07/11/2018 15:04    Pending Labs Unresulted Labs (From admission, onward)    Start     Ordered   07/11/18 1518  Culture, blood (routine x 2)  BLOOD CULTURE X 2,   STAT     07/11/18 1517   07/11/18 1238  Urinalysis, Routine w reflex microscopic  ONCE - STAT,   STAT     07/11/18 1238          Vitals/Pain Today's Vitals   07/11/18 1218 07/11/18 1300 07/11/18 1548 07/11/18 1600  BP: (!) 162/64 (!) 132/39 (!) 143/94 136/67  Pulse: 97 94 95 95  Resp: 18 16 15 16   Temp: 98.5 F (36.9 C)     TempSrc: Oral     SpO2: 95% 93% 96% 92%  Weight: 90.7 kg     Height: 5\' 1"  (1.549 m)       Isolation Precautions No active  isolations  Medications Medications  HYDROmorphone (DILAUDID) injection 1 mg (has no administration in time range)  ARIPiprazole (ABILIFY) tablet 5 mg (has no administration in time range)  amLODipine (NORVASC) tablet 5 mg (has no administration in time range)  clonazePAM (KLONOPIN) tablet 0.5 mg (has no administration in time range)  fentaNYL (DURAGESIC) 12 MCG/HR 1 patch (has no administration in time range)  FLUoxetine (PROZAC) capsule 60 mg (has no administration in time range)  gabapentin (NEURONTIN) capsule 100 mg (has no administration in time range)  insulin glargine (LANTUS) injection 20 Units (has no administration in time range)  metoprolol tartrate (LOPRESSOR) tablet 50 mg (has no administration in time range)  oxyCODONE (Oxy IR/ROXICODONE) immediate release tablet 10 mg (has no administration in time range)  propylthiouracil (PTU) tablet 75 mg (has no administration in time range)  rOPINIRole (REQUIP) tablet 3 mg (has no administration in time range)  pantoprazole (PROTONIX) EC tablet 40 mg (has no administration in time range)  fenofibrate tablet 54 mg (has no administration in time range)  bisacodyl (DULCOLAX) suppository 10 mg (has no administration in time range)  polyethylene glycol (MIRALAX / GLYCOLAX) packet 17 g (has no administration in time  range)  sodium chloride flush (NS) 0.9 % injection 3 mL (has no administration in time range)  sodium chloride flush (NS) 0.9 % injection 3 mL (has no administration in time range)  0.9 %  sodium chloride infusion (has no administration in time range)  acetaminophen (TYLENOL) tablet 650 mg (has no administration in time range)    Or  acetaminophen (TYLENOL) suppository 650 mg (has no administration in time range)  HYDROmorphone (DILAUDID) injection 1 mg (1 mg Intravenous Given 07/11/18 1254)  sodium chloride 0.9 % bolus 500 mL (500 mLs Intravenous New Bag/Given 07/11/18 1254)  ondansetron (ZOFRAN) injection 4 mg (4 mg Intravenous Given 07/11/18 1254)  cyclobenzaprine (FLEXERIL) tablet 10 mg (10 mg Oral Given 07/11/18 1255)  gadobutrol (GADAVIST) 1 MMOL/ML injection 10 mL (10 mLs Intravenous Contrast Given 07/11/18 1436)    Mobility walks with person assist     Focused Assessments MSK   R Recommendations: See Admitting Provider Note  Report given to:   Additional Notes:
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
## 2018-07-11 NOTE — ED Notes (Signed)
Bed: BM21 Expected date:  Expected time:  Means of arrival:  Comments: EMS 68yo lower back pain radiating to pelvis

## 2018-07-12 ENCOUNTER — Inpatient Hospital Stay: Payer: Medicare Other | Admitting: Internal Medicine

## 2018-07-12 DIAGNOSIS — M609 Myositis, unspecified: Secondary | ICD-10-CM

## 2018-07-12 DIAGNOSIS — E669 Obesity, unspecified: Secondary | ICD-10-CM

## 2018-07-12 DIAGNOSIS — Z6837 Body mass index (BMI) 37.0-37.9, adult: Secondary | ICD-10-CM

## 2018-07-12 DIAGNOSIS — M8620 Subacute osteomyelitis, unspecified site: Secondary | ICD-10-CM

## 2018-07-12 DIAGNOSIS — G062 Extradural and subdural abscess, unspecified: Secondary | ICD-10-CM

## 2018-07-12 DIAGNOSIS — M4620 Osteomyelitis of vertebra, site unspecified: Secondary | ICD-10-CM

## 2018-07-12 DIAGNOSIS — M4640 Discitis, unspecified, site unspecified: Secondary | ICD-10-CM

## 2018-07-12 LAB — CBC WITH DIFFERENTIAL/PLATELET
Abs Immature Granulocytes: 0.01 10*3/uL (ref 0.00–0.07)
Basophils Absolute: 0 10*3/uL (ref 0.0–0.1)
Basophils Relative: 0 %
Eosinophils Absolute: 0 10*3/uL (ref 0.0–0.5)
Eosinophils Relative: 0 %
HCT: 32.2 % — ABNORMAL LOW (ref 36.0–46.0)
Hemoglobin: 9.6 g/dL — ABNORMAL LOW (ref 12.0–15.0)
Immature Granulocytes: 0 %
Lymphocytes Relative: 18 %
Lymphs Abs: 0.6 10*3/uL — ABNORMAL LOW (ref 0.7–4.0)
MCH: 24.1 pg — ABNORMAL LOW (ref 26.0–34.0)
MCHC: 29.8 g/dL — ABNORMAL LOW (ref 30.0–36.0)
MCV: 80.9 fL (ref 80.0–100.0)
Monocytes Absolute: 0.3 10*3/uL (ref 0.1–1.0)
Monocytes Relative: 11 %
Neutro Abs: 2.1 10*3/uL (ref 1.7–7.7)
Neutrophils Relative %: 71 %
Platelets: 230 10*3/uL (ref 150–400)
RBC: 3.98 MIL/uL (ref 3.87–5.11)
RDW: 16.2 % — ABNORMAL HIGH (ref 11.5–15.5)
WBC: 3 10*3/uL — ABNORMAL LOW (ref 4.0–10.5)
nRBC: 0 % (ref 0.0–0.2)

## 2018-07-12 LAB — MAGNESIUM: Magnesium: 1.7 mg/dL (ref 1.7–2.4)

## 2018-07-12 LAB — COMPREHENSIVE METABOLIC PANEL
ALT: 17 U/L (ref 0–44)
AST: 21 U/L (ref 15–41)
Albumin: 3.3 g/dL — ABNORMAL LOW (ref 3.5–5.0)
Alkaline Phosphatase: 93 U/L (ref 38–126)
Anion gap: 10 (ref 5–15)
BUN: 12 mg/dL (ref 8–23)
CO2: 29 mmol/L (ref 22–32)
Calcium: 9.4 mg/dL (ref 8.9–10.3)
Chloride: 97 mmol/L — ABNORMAL LOW (ref 98–111)
Creatinine, Ser: 0.89 mg/dL (ref 0.44–1.00)
GFR calc Af Amer: >60 mL/min (ref >60)
GFR calc non Af Amer: >60 mL/min (ref >60)
Glucose, Bld: 180 mg/dL — ABNORMAL HIGH (ref 70–99)
Potassium: 3.4 mmol/L — ABNORMAL LOW (ref 3.5–5.1)
Sodium: 136 mmol/L (ref 135–145)
Total Bilirubin: 0.4 mg/dL (ref 0.3–1.2)
Total Protein: 7.2 g/dL (ref 6.5–8.1)

## 2018-07-12 LAB — GLUCOSE, CAPILLARY
Glucose-Capillary: 140 mg/dL — ABNORMAL HIGH (ref 70–99)
Glucose-Capillary: 124 mg/dL — ABNORMAL HIGH (ref 70–99)
Glucose-Capillary: 119 mg/dL — ABNORMAL HIGH (ref 70–99)
Glucose-Capillary: 132 mg/dL — ABNORMAL HIGH (ref 70–99)

## 2018-07-12 LAB — PHOSPHORUS: Phosphorus: 3.3 mg/dL (ref 2.5–4.6)

## 2018-07-12 MED ORDER — POTASSIUM CHLORIDE CRYS ER 20 MEQ PO TBCR
40.0000 meq | EXTENDED_RELEASE_TABLET | ORAL | Status: AC
  Administered 2018-07-12 (×2): 40 meq via ORAL
  Filled 2018-07-12 (×2): qty 2

## 2018-07-12 NOTE — Progress Notes (Signed)
On call physician notified of urine output less than 2mL/hr and 249mL showing on bladder scan.   No orders received.

## 2018-07-12 NOTE — Progress Notes (Signed)
PROGRESS NOTE    EVIAN DERRINGER  TKP:[id number] DOB: March 07, 1951 DOA: 07/11/2018 PCP: Crist Infante, MD  Brief Narrative:  HPI per Dr. Debbe Odea on 07/11/2018 MCKELL RIECKE is a 68 y.o. female with medical history of DM 2 who presents with lower back pain. She is currently being treated for a lumbar discitis with Methicillin resistant coag neg staph with Vancomycin at home. She developed increasing back pain about 1 wk ago and noted a fever of about 100 degrees. She has had worsening of her pain with it radiating to her left buttock. She has no other complaints.   ED Course:  MRI lumbar spine> Progression of discitis and osteomyelitis at L1-2. Progressive fluid/pus in the disc space extending into the anterior paraspinous soft tissues compatible with progressive abscess. Progressive abscess in the left psoas muscle. Progressive myositis throughout the psoas muscles bilaterally and the erector spinae muscles bilaterally. Posterior extradural fluid collection on the left at L1-2 has progressed in the interval and may represent abscess  **Interim History Patient To undergo CT aspiration tomorrow as she was not made n.p.o. last night and ate this morning.  ID consulted and because conservative management with IV antibiotics without source control is not effectively treated this issue she will need drainage for pus for source control and ID also recommending restarting vancomycin after drainage has occurred.  Assessment & Plan:   Principal Problem:   Abscess in epidural space of lumbar spine Active Problems:   DM (diabetes mellitus) (HCC)   Hypothyroidism   Obesity   Graves disease  Abscess in epidural space of lumbar spine associated with Discitis and Osteomyelitis of L1-L2; Previously MRSE Myositis in b/l psoas muscles; Progressive Abscess in Left Psoas Muscle -Contacted IR for drainage and they they would like input from ID and Neurosurgery -ID Dr Linus Salmons and Neurosurgery Dr Ellene Route  were consulted; Dr. Ellene Route of Neurosurgery advised that repeat needle biopsy of the elbow wound now to displace and aspirating some of the pus from the area.  Culture would be preferable to doing an open surgical decompression -Infectious diseases recommending drainage of the abscess and holding antibiotics pending drainage for culture and starting vancomycin after drainage -Hold off on antibiotics for today -Discussed with interventional radiology coordinator and because the patient is not n.p.o. she will undergo CT aspiration tomorrow and then antibiotics will be started then -Oral and IV medications ordered for pain control as below  -F/u blood cultures are NGTD <24 hours  -WBC went from 3.6 -> 3.0  Diabetes Mellitus Type 2 -C/w Lantus at lower than home dose at 20 units sq BID -Started Sensitive Novolog SS AC/HS  -CBGs ranging from   HTN -C/w Metoprolol Tartrate 50 mg po TID and Amlodipine 5 mg po qHS  Obesity Estimated body mass index is 37.79 kg/m as calculated from the following:   Height as of this encounter: 5\' 1"  (1.549 m).   Weight as of this encounter: 90.7 kg. -Weight Loss and Dietary Counseling given   Graves Disease -C/w Propylthiouracil 75 mg po Daily   Fibromyalgia/Depression/RLS -C/w Aripiprazole 5 mg po Daily, Fluoxetine 60 mg po qHS, Clonazepam 0.5 mg po qHS, and Ropinirole 3 mg po qHS  Chronic Pain Syndrome -C/w Gabapentin 100 mg po TID, Fentanyl 12 mcg/hr Patch q72h, and IV Hydromorphone 1 mg q3hprn Severe Pain and Oxycodone 10 mg po q4hprn Moderate Pain  -Bowel Regimen with Miralax 17 gram po Daily and   HLD -C/w Fenofibrate 54 m po qHS  GERD -  C/w Pantoprazole 40 mg po Daily   Normocytic Anemia -Patient's Hb/Hct went from 9.8/32.1 -> 9.6/32.2 -Continue to Monitor for S/Sx of Bleeding -Check Anemia Panel in the AM  -Repeat CBC in AM   Hypokalemia -Patient's K+ this AM was 3.4 -Replete with po KCl 40 mEQ BID x2 -Continue to Monitor and Replete  as Necessary -Repeat CMP in AM   DVT prophylaxis: SCDs Code Status: FULL CODE Family Communication: No family present at bedside  Disposition Plan: Remain Inpatient for Treatment and Aspiration of Abscesses done by IR   Consultants:   Infectious Diseases  Neurosurgery  Interventional Radiology   Procedures: None   Antimicrobials:  Anti-infectives (From admission, onward)   None     Subjective: Seen and examined at bedside and states that she was doing okay but was complaining of some back pain that radiated into her hips.  No nausea or vomiting.  No chest pain, lightheadedness or dizziness.  No other concerns or complaints at this time and wanted to know what was going to happen today.  Objective: Vitals:   07/12/18 0519 07/12/18 0852 07/12/18 1430 07/12/18 1511  BP: (!) 111/55 (!) 168/54 123/67 (!) 134/59  Pulse: 84 91 85 84  Resp: 18 14 14 15   Temp: 98.2 F (36.8 C) 98.9 F (37.2 C)  97.9 F (36.6 C)  TempSrc: Oral Oral  Oral  SpO2: 94% 93% (!) 88% 96%  Weight:      Height:        Intake/Output Summary (Last 24 hours) at 07/12/2018 1514 Last data filed at 07/12/2018 1159 Gross per 24 hour  Intake 600 ml  Output 250 ml  Net 350 ml   Filed Weights   07/11/18 1218  Weight: 90.7 kg   Examination: Physical Exam:  Constitutional: WN/W obese Caucasian female in mild distress and appears calm but uncomfortable Eyes: Lids and conjunctivae normal, sclerae anicteric  ENMT: External Ears, Nose appear normal. Grossly normal hearing. .  Neck: Appears normal, supple, no cervical masses, normal ROM, no appreciable thyromegaly; no JVD Respiratory: Diminished to auscultation bilaterally, no wheezing, rales, rhonchi or crackles. Normal respiratory effort and patient is not tachypenic. No accessory muscle use.  Cardiovascular: RRR, no murmurs / rubs / gallops. S1 and S2 auscultated. Trace extremity edema.  Abdomen: Soft, non-tender, Distended 2/2 to body habiuts. No masses  palpated. No appreciable hepatosplenomegaly. Bowel sounds positive x4.  GU: Deferred. Musculoskeletal: No clubbing / cyanosis of digits/nails. No joint deformity upper and lower extremities.  Skin: No rashes, lesions, ulcers on a limited skin evaluation. No induration; Warm and dry.  Neurologic: CN 2-12 grossly intact with no focal deficits.  Romberg sign and cerebellar reflexes not assessed.  Psychiatric: Normal judgment and insight. Alert and oriented x 3. Anxious mood and appropriate affect.   Data Reviewed: I have personally reviewed following labs and imaging studies  CBC: Recent Labs  Lab 07/11/18 1257 07/12/18 0920  WBC 3.6* 3.0*  NEUTROABS 2.4 2.1  HGB 9.8* 9.6*  HCT 32.1* 32.2*  MCV 79.9* 80.9  PLT 251 161   Basic Metabolic Panel: Recent Labs  Lab 07/11/18 1257 07/12/18 0920  NA 136 136  K 4.2 3.4*  CL 94* 97*  CO2 29 29  GLUCOSE 148* 180*  BUN 14 12  CREATININE 0.93 0.89  CALCIUM 9.7 9.4  MG  --  1.7  PHOS  --  3.3   GFR: Estimated Creatinine Clearance: 63.8 mL/min (by C-G formula based on SCr of 0.89 mg/dL). Liver  Function Tests: Recent Labs  Lab 07/11/18 1257 07/12/18 0920  AST 36 21  ALT 18 17  ALKPHOS 104 93  BILITOT 1.0 0.4  PROT 8.1 7.2  ALBUMIN 3.6 3.3*   No results for input(s): LIPASE, AMYLASE in the last 168 hours. No results for input(s): AMMONIA in the last 168 hours. Coagulation Profile: No results for input(s): INR, PROTIME in the last 168 hours. Cardiac Enzymes: No results for input(s): CKTOTAL, CKMB, CKMBINDEX, TROPONINI in the last 168 hours. BNP (last 3 results) No results for input(s): PROBNP in the last 8760 hours. HbA1C: No results for input(s): HGBA1C in the last 72 hours. CBG: Recent Labs  Lab 07/11/18 1649 07/11/18 2150 07/12/18 0716 07/12/18 1151  GLUCAP 121* 114* 140* 124*   Lipid Profile: No results for input(s): CHOL, HDL, LDLCALC, TRIG, CHOLHDL, LDLDIRECT in the last 72 hours. Thyroid Function Tests: No  results for input(s): TSH, T4TOTAL, FREET4, T3FREE, THYROIDAB in the last 72 hours. Anemia Panel: No results for input(s): VITAMINB12, FOLATE, FERRITIN, TIBC, IRON, RETICCTPCT in the last 72 hours. Sepsis Labs: No results for input(s): PROCALCITON, LATICACIDVEN in the last 168 hours.  Recent Results (from the past 240 hour(s))  Culture, blood (routine x 2)     Status: None (Preliminary result)   Collection Time: 07/11/18  4:52 PM  Result Value Ref Range Status   Specimen Description   Final    BLOOD LEFT ARM Performed at [facility] [address], [place], [place] [address]    Special Requests   Final    BOTTLES DRAWN AEROBIC AND ANAEROBIC Blood Culture adequate volume   Culture   Final    NO GROWTH < 24 HOURS Performed at [facility], [place] [address], [place], [place] [address]    Report Status PENDING  Incomplete  Culture, blood (routine x 2)     Status: None (Preliminary result)   Collection Time: 07/11/18  4:58 PM  Result Value Ref Range Status   Specimen Description   Final    BLOOD LEFT ARM Performed at [facility] [address], [place], [place] [address]    Special Requests   Final    BOTTLES DRAWN AEROBIC AND ANAEROBIC Blood Culture adequate volume   Culture   Final    NO GROWTH < 24 HOURS Performed at [facility], [place] [address], [place], [place] [address]    Report Status PENDING  Incomplete     Radiology Studies: Mr Lumbar Spine W Wo Contrast  Result Date: 07/11/2018 CLINICAL DATA:  Spinal infection.  Increased back pain. EXAM: MRI LUMBAR SPINE WITHOUT AND WITH CONTRAST TECHNIQUE: Multiplanar and multiecho pulse sequences of the lumbar spine were obtained without and with intravenous contrast. CONTRAST:  10 mL Gadovist IV COMPARISON:  MRI lumbar spine 06/02/2018 FINDINGS: Segmentation:  Image quality degraded by significant motion Normal segmentation Alignment: Approximately 5 mm retrolisthesis L2-3 unchanged.  Slight anterolisthesis L5-S1. Vertebrae: Progressive discitis and osteomyelitis at L1-2. Increased fluid in the disc space with endplate erosive changes which have progressed significantly. Diffuse bone marrow edema and enhancement throughout L1 and L2 vertebral bodies. No other levels of infection in the spine. Conus medullaris and cauda equina: Conus extends to the L1-2 level. Conus and cauda equina appear normal. Paraspinal and other soft tissues: Extensive paraspinous soft tissue thickening and enhancement around the L1-2 disc space compatible with infection in the soft tissues. 1 cm anterior paraspinous soft tissue abscesses have become more well-defined. Left  psoas abscess measuring 17 mm has become more well-defined and enlarged. Diffuse myositis with enhancement throughout the psoas muscle bilaterally also with progression. Probable myositis in the erector spinae muscles bilaterally which shows diffuse enhancement. Multilocular cystic mass in the uncinate process of the pancreas measuring approximately 25 x 30 mm unchanged from prior studies. See MRI pancreas report 2018/06/24. Disc levels: T12-L1: Negative disc space. Ventral epidural fluid collection extending from T11 through L1 has improved in the interval although today's study is degraded by significant motion. L1-2: Progression of discitis and osteomyelitis with progressive endplate destruction. 4 mm ventral epidural abscess in the midline appears similar to the prior study. Diffuse dural thickening and enhancement at L1 and L2 likely has progressed although the prior study was not done with intravenous contrast. Nonenhancing extradural fluid collection posteriorly on the left has progressed. L2-3: 5 mm retrolisthesis. Advanced disc degeneration with disc bulging and spurring. Bilateral facet degeneration. Severe spinal stenosis unchanged from the prior study. Extradural fluid collection on the left of the thecal sac at L2-3 has progressed and may  represent abscess or reactive effusion. L3-4: Disc bulging and spurring. Bilateral facet degeneration. Mild spinal stenosis L4-5: Mild anterolisthesis. Disc and facet degeneration with mild spinal stenosis L5-S1: Mild anterolisthesis with moderate facet degeneration. Moderate subarticular stenosis bilaterally. IMPRESSION: 1. Image quality degraded by significant motion 2. Progression of discitis and osteomyelitis at L1-2. Progressive fluid/pus in the disc space extending into the anterior paraspinous soft tissues compatible with progressive abscess. Progressive abscess in the left psoas muscle. Progressive myositis throughout the psoas muscles bilaterally and the erector spinae muscles bilaterally. Posterior extradural fluid collection on the left at L1-2 has progressed in the interval and may represent abscess. Diffuse dural thickening and small ventral epidural abscess at L1-2 difficult to compare as intravenous contrast not given on the prior study. No new levels of infection 3. Multilevel spondylosis with severe spinal stenosis at L2-3 due to degenerative change. Electronically Signed   By: Franchot Gallo M.D.   On: 07/11/2018 15:04   Scheduled Meds: . amLODipine  5 mg Oral QHS  . ARIPiprazole  5 mg Oral Daily  . clonazePAM  0.5 mg Oral QHS  . fenofibrate  54 mg Oral Q supper  . fentaNYL  1 patch Transdermal Q72H  . FLUoxetine  60 mg Oral QHS  . gabapentin  100 mg Oral 3 times per day  . insulin aspart  0-5 Units Subcutaneous QHS  . insulin aspart  0-9 Units Subcutaneous TID WC  . insulin glargine  20 Units Subcutaneous BID  . metoprolol tartrate  50 mg Oral TID  . pantoprazole  40 mg Oral Daily  . polyethylene glycol  17 g Oral Daily  . propylthiouracil  75 mg Oral Daily  . rOPINIRole  3 mg Oral QHS  . sodium chloride flush  3 mL Intravenous Q12H   Continuous Infusions: . sodium chloride      LOS: 1 day    Kerney Elbe, DO [facility] PAGER is on AMION  If 7PM-7AM,  please contact night-coverage [email or web address] Password [facility] 07/12/2018, 3:14 PM

## 2018-07-12 NOTE — Progress Notes (Addendum)
    [facility] for Infectious Disease   Reason for visit: Follow up on discitis and abscess  Interval History: afebrile, WBC stable;. No changes.  No aspiration and drainage yet.  REmains off of antibiotics.    Physical Exam: Constitutional:  Vitals:   07/12/18 0519 07/12/18 0852  BP: (!) 111/55 (!) 168/54  Pulse: 84 91  Resp: 18 14  Temp: 98.2 F (36.8 C) 98.9 F (37.2 C)  SpO2: 94% 93%   patient appears in NAD, resting comfortably Respiratory: Normal respiratory effort; CTA B Cardiovascular: RRR   Review of Systems: Constitutional: negative for fevers and chills Gastrointestinal: negative for nausea and diarrhea  Lab Results  Component Value Date   WBC 3.0 (L) 07/12/2018   HGB 9.6 (L) 07/12/2018   HCT 32.2 (L) 07/12/2018   MCV 80.9 07/12/2018   PLT 230 07/12/2018    Lab Results  Component Value Date   CREATININE 0.89 07/12/2018   BUN 12 07/12/2018   NA 136 07/12/2018   K 3.4 (L) 07/12/2018   CL 97 (L) 07/12/2018   CO2 29 07/12/2018    Lab Results  Component Value Date   ALT 17 07/12/2018   AST 21 07/12/2018   ALKPHOS 93 07/12/2018     Microbiology: Recent Results (from the past 240 hour(s))  Culture, blood (routine x 2)     Status: None (Preliminary result)   Collection Time: 07/11/18  4:52 PM  Result Value Ref Range Status   Specimen Description   Final    BLOOD LEFT ARM Performed at [facility], [place] [address], [place], [place] [address]    Special Requests   Final    BOTTLES DRAWN AEROBIC AND ANAEROBIC Blood Culture adequate volume   Culture   Final    NO GROWTH < 24 HOURS Performed at [facility], [place] [address] [place], [place] [address]    Report Status PENDING  Incomplete  Culture, blood (routine x 2)     Status: None (Preliminary result)   Collection Time: 07/11/18  4:58 PM  Result Value Ref Range Status   Specimen Description   Final    BLOOD LEFT ARM Performed at [facility] [address], [place], [place] [address]    Special Requests   Final    BOTTLES DRAWN AEROBIC AND ANAEROBIC Blood Culture adequate volume   Culture   Final    NO GROWTH < 24 HOURS Performed at [facility], [place] [address], [place],  [address]    Report Status PENDING  Incomplete    Impression/Plan:  1. Discitits/osteomyelitis - MRSE on cultures previously. Has been on vancomycin.  2. Psoas and epidural abscess - needs drainage of the pus for source control.  Conservative management with IV antibiotics without source control has not effectively treated this.  Appreciate IR assistance in drainage.   Restart vancomycin and drainage procedure today.

## 2018-07-13 ENCOUNTER — Inpatient Hospital Stay (HOSPITAL_COMMUNITY): Payer: Medicare Other

## 2018-07-13 DIAGNOSIS — Z8619 Personal history of other infectious and parasitic diseases: Secondary | ICD-10-CM

## 2018-07-13 LAB — CBC WITH DIFFERENTIAL/PLATELET
Abs Immature Granulocytes: 0.01 10*3/uL (ref 0.00–0.07)
Basophils Absolute: 0 10*3/uL (ref 0.0–0.1)
Basophils Relative: 0 %
Eosinophils Absolute: 0 10*3/uL (ref 0.0–0.5)
Eosinophils Relative: 0 %
HCT: 32.3 % — ABNORMAL LOW (ref 36.0–46.0)
Hemoglobin: 9.4 g/dL — ABNORMAL LOW (ref 12.0–15.0)
Immature Granulocytes: 0 %
Lymphocytes Relative: 22 %
Lymphs Abs: 0.7 10*3/uL (ref 0.7–4.0)
MCH: 23.7 pg — ABNORMAL LOW (ref 26.0–34.0)
MCHC: 29.1 g/dL — ABNORMAL LOW (ref 30.0–36.0)
MCV: 81.4 fL (ref 80.0–100.0)
Monocytes Absolute: 0.5 10*3/uL (ref 0.1–1.0)
Monocytes Relative: 15 %
Neutro Abs: 2.1 10*3/uL (ref 1.7–7.7)
Neutrophils Relative %: 63 %
Platelets: 230 10*3/uL (ref 150–400)
RBC: 3.97 MIL/uL (ref 3.87–5.11)
RDW: 16 % — ABNORMAL HIGH (ref 11.5–15.5)
WBC: 3.3 10*3/uL — ABNORMAL LOW (ref 4.0–10.5)
nRBC: 0 % (ref 0.0–0.2)

## 2018-07-13 LAB — COMPREHENSIVE METABOLIC PANEL
ALT: 16 U/L (ref 0–44)
AST: 19 U/L (ref 15–41)
Albumin: 3.4 g/dL — ABNORMAL LOW (ref 3.5–5.0)
Alkaline Phosphatase: 97 U/L (ref 38–126)
Anion gap: 8 (ref 5–15)
BUN: 11 mg/dL (ref 8–23)
CO2: 29 mmol/L (ref 22–32)
Calcium: 9.8 mg/dL (ref 8.9–10.3)
Chloride: 101 mmol/L (ref 98–111)
Creatinine, Ser: 0.77 mg/dL (ref 0.44–1.00)
GFR calc Af Amer: >60 mL/min (ref >60)
GFR calc non Af Amer: >60 mL/min (ref >60)
Glucose, Bld: 117 mg/dL — ABNORMAL HIGH (ref 70–99)
Potassium: 4.3 mmol/L (ref 3.5–5.1)
Sodium: 138 mmol/L (ref 135–145)
Total Bilirubin: 0.6 mg/dL (ref 0.3–1.2)
Total Protein: 7.4 g/dL (ref 6.5–8.1)

## 2018-07-13 LAB — GLUCOSE, CAPILLARY
Glucose-Capillary: 137 mg/dL — ABNORMAL HIGH (ref 70–99)
Glucose-Capillary: 136 mg/dL — ABNORMAL HIGH (ref 70–99)
Glucose-Capillary: 88 mg/dL (ref 70–99)
Glucose-Capillary: 105 mg/dL — ABNORMAL HIGH (ref 70–99)

## 2018-07-13 LAB — MAGNESIUM: Magnesium: 1.7 mg/dL (ref 1.7–2.4)

## 2018-07-13 LAB — PHOSPHORUS: Phosphorus: 2.8 mg/dL (ref 2.5–4.6)

## 2018-07-13 LAB — PROTIME-INR
INR: 1.3 — ABNORMAL HIGH (ref 0.8–1.2)
Prothrombin Time: 16.1 seconds — ABNORMAL HIGH (ref 11.4–15.2)

## 2018-07-13 MED ORDER — METOPROLOL TARTRATE 12.5 MG HALF TABLET
12.5000 mg | ORAL_TABLET | Freq: Three times a day (TID) | ORAL | Status: DC
  Administered 2018-07-13 – 2018-07-31 (×52): 12.5 mg via ORAL
  Filled 2018-07-13 (×53): qty 1

## 2018-07-13 MED ORDER — VANCOMYCIN HCL 1000 MG IV SOLR
1000.0000 mg | INTRAVENOUS | Status: DC
  Filled 2018-07-13: qty 1000

## 2018-07-13 MED ORDER — HEPARIN SODIUM (PORCINE) 5000 UNIT/ML IJ SOLN
5000.0000 [IU] | Freq: Three times a day (TID) | INTRAMUSCULAR | Status: DC
  Administered 2018-07-13 – 2018-07-31 (×51): 5000 [IU] via SUBCUTANEOUS
  Filled 2018-07-13 (×51): qty 1

## 2018-07-13 MED ORDER — NALOXONE HCL 0.4 MG/ML IJ SOLN
INTRAMUSCULAR | Status: AC
  Filled 2018-07-13: qty 1

## 2018-07-13 MED ORDER — MIDAZOLAM HCL 2 MG/2ML IJ SOLN
INTRAMUSCULAR | Status: AC
  Filled 2018-07-13: qty 4

## 2018-07-13 MED ORDER — FENTANYL CITRATE (PF) 100 MCG/2ML IJ SOLN
INTRAMUSCULAR | Status: AC
  Filled 2018-07-13: qty 4

## 2018-07-13 MED ORDER — VANCOMYCIN HCL 10 G IV SOLR
2000.0000 mg | Freq: Once | INTRAVENOUS | Status: AC
  Administered 2018-07-13: 14:00:00 2000 mg via INTRAVENOUS
  Filled 2018-07-13: qty 2000

## 2018-07-13 MED ORDER — LIDOCAINE HCL (PF) 1 % IJ SOLN
INTRAMUSCULAR | Status: AC | PRN
  Administered 2018-07-13: 10 mL

## 2018-07-13 MED ORDER — ONDANSETRON HCL 4 MG/2ML IJ SOLN
4.0000 mg | Freq: Four times a day (QID) | INTRAMUSCULAR | Status: DC | PRN
  Administered 2018-07-14 – 2018-07-27 (×11): 4 mg via INTRAVENOUS
  Filled 2018-07-13 (×11): qty 2

## 2018-07-13 MED ORDER — FENTANYL CITRATE (PF) 100 MCG/2ML IJ SOLN
INTRAMUSCULAR | Status: AC | PRN
  Administered 2018-07-13 (×2): 50 ug via INTRAVENOUS

## 2018-07-13 MED ORDER — HYDROMORPHONE HCL 1 MG/ML IJ SOLN
1.0000 mg | INTRAMUSCULAR | Status: DC | PRN
  Administered 2018-07-13 – 2018-07-17 (×10): 1 mg via INTRAVENOUS
  Filled 2018-07-13 (×10): qty 1

## 2018-07-13 MED ORDER — SODIUM CHLORIDE 0.9% FLUSH: 10.0000 mL | INTRAVENOUS | Status: DC | PRN

## 2018-07-13 MED ORDER — MIDAZOLAM HCL 2 MG/2ML IJ SOLN
INTRAMUSCULAR | Status: AC | PRN
  Administered 2018-07-13 (×3): 1 mg via INTRAVENOUS

## 2018-07-13 MED ORDER — SODIUM CHLORIDE 0.9 % IV BOLUS
1000.0000 mL | Freq: Once | INTRAVENOUS | Status: AC
  Administered 2018-07-13: 1000 mL via INTRAVENOUS

## 2018-07-13 MED ORDER — CYCLOBENZAPRINE HCL 5 MG PO TABS
5.0000 mg | ORAL_TABLET | Freq: Once | ORAL | Status: AC
  Administered 2018-07-13: 5 mg via ORAL
  Filled 2018-07-13: qty 1

## 2018-07-13 MED ORDER — FLUMAZENIL 0.5 MG/5ML IV SOLN
INTRAVENOUS | Status: AC
  Filled 2018-07-13: qty 5

## 2018-07-13 NOTE — Progress Notes (Signed)
PROGRESS NOTE    Tiffany POPPELL  [name]:[id number] DOB: [birth year] DOA: 07/11/2018 PCP: Tiffany Ran, MD  Brief Narrative:  HPI per Dr. Calvert Cantor on 07/11/2018 [name] is a 68 y.o. female with medical history of DM 2 who presents with lower back pain. She is currently being treated for a lumbar discitis with Methicillin resistant coag neg staph with Vancomycin at home. She developed increasing back pain about 1 wk ago and noted a fever of about 100 degrees. She has had worsening of her pain with it radiating to her left buttock. She has no other complaints.   ED Course:  MRI lumbar spine> Progression of discitis and osteomyelitis at L1-2. Progressive fluid/pus in the disc space extending into the anterior paraspinous soft tissues compatible with progressive abscess. Progressive abscess in the left psoas muscle. Progressive myositis throughout the psoas muscles bilaterally and the erector spinae muscles bilaterally. Posterior extradural fluid collection on the left at L1-2 has progressed in the interval and may represent abscess  **Interim History Underwent CT aspiration today and only a few drops of blood was aspirated from the Left Psoas Site.  ID consulted and because conservative management with IV antibiotics without source control had not effectively treated this issue she will need drainage for pus for source control so will need to re-discuss with Neurosurgery. Patient was started on IV Abx with IV Vancomycin per ID recommendations after attempted/unsuccessful drainage.   Assessment & Plan:   Principal Problem:   Abscess in epidural space of lumbar spine Active Problems:   DM (diabetes mellitus) (HCC)   Hypothyroidism   Obesity   Graves disease  Abscess in epidural space of lumbar spine associated with Discitis and Osteomyelitis of L1-L2; Previously MRSE Myositis in b/l psoas muscles; Progressive Abscess in Left Psoas Muscle -Contacted IR for drainage and they they  would like input from ID and Neurosurgery -ID Dr. Luciana Axe and Neurosurgery Dr. Danielle Dess were consulted; Dr. Danielle Dess of Neurosurgery advised that repeat needle biopsy of the elbow wound now to displace and aspirating some of the pus from the area.  Culture would be preferable to doing an open surgical decompression -Infectious diseases recommended drainage of the abscess and holding antibiotics pending drainage for culture and starting vancomycin after drainage -Underwent CT Aspiration but there was no significant drainage and there was only a few drops of blood aspirated from the Left Psoas site -Oral and IV medications ordered for pain control as below  -F/u blood cultures are NGTD at 2 Days -Aerobic/Anaerobic Cx sent and Gram Stain showed No WBC seen and No Organisms seen  -WBC went from 3.6 -> 3.0 -> 3.3 -Will discuss with Dr. Danielle Dess of Neurosurgery to weigh in now that Drainage was unsuccessful/limited   Diabetes Mellitus Type 2 -C/w Lantus at lower than home dose at 20 units sq BID -Started Sensitive Novolog SS AC/HS  -CBGs ranging from 119-137  HTN; but now Hypotensive  -Changed Metoprolol Tartrate 50 mg po TID to 12.5 mg po TID and will discontinue Amlodipine 5 mg po qHS -Give 1 Liter Bolus and re-evaluate -BP dropped to 75/53 this Afternoon  -? Related to Pain Medication; Currently not Symptomatic -Continue to Monitor Blood Pressure Carefully    Obesity Estimated body mass index is 37.79 kg/m as calculated from the following:   Height as of this encounter: 5\' 1"  (1.549 m).   Weight as of this encounter: 90.7 kg. -Weight Loss and Dietary Counseling given   Graves Disease -C/w Propylthiouracil 75 mg  po Daily   Fibromyalgia/Depression/RLS -C/w Aripiprazole 5 mg po Daily, Fluoxetine 60 mg po qHS, Clonazepam 0.5 mg po qHS, and Ropinirole 3 mg po qHS  Chronic Pain Syndrome -C/w Gabapentin 100 mg po TID, Fentanyl 12 mcg/hr Patch q72h, and IV Hydromorphone 1 mg q3hprn Severe Pain  and Oxycodone 10 mg po q4hprn Moderate Pain  -Bowel Regimen with Miralax 17 gram po Daily and   HLD -C/w Fenofibrate 54 m po qHS  GERD -C/w Pantoprazole 40 mg po Daily   Normocytic Anemia -Patient's Hb/Hct went from 9.8/32.1 -> 9.6/32.2 -> 9.4/32.3 -Continue to Monitor for S/Sx of Bleeding -PT was 16.1 and INR was 1.3 -Check Anemia Panel in the AM  -Repeat CBC in AM   Hypokalemia -Patient's K+ was 3.4 and improved to 4.3 -Continue to Monitor and Replete as Necessary -Repeat CMP in AM   DVT prophylaxis: SCDs; Will add Heparin 5,000 units sq q8h Code Status: FULL CODE Family Communication: No family present at bedside  Disposition Plan: Continue Further workup    Consultants:   Infectious Diseases  Neurosurgery  Interventional Radiology   Procedures: None   Antimicrobials:  Anti-infectives (From admission, onward)   Start     Dose/Rate Route Frequency Ordered Stop   07/14/18 1400  vancomycin (VANCOCIN) 1,000 mg in sodium chloride 0.9 % 250 mL IVPB     1,000 mg 250 mL/hr over 60 Minutes Intravenous Every 24 hours 07/13/18 1304     07/13/18 1345  vancomycin (VANCOCIN) 2,000 mg in sodium chloride 0.9 % 500 mL IVPB     2,000 mg 250 mL/hr over 120 Minutes Intravenous  Once 07/13/18 1301       Subjective: Seen and examined at bedside after her CT aspiration and her main complaint was that she is cold and wanting to eat.  Denies any chest pain, lightheadedness dizziness shortness of breath.  States that her back was uncomfortable.  No other concerns or complaints at this time and was wanting to know that since they could not aspirate anything if she actually had an infection there.  Objective: Vitals:   07/13/18 1134 07/13/18 1149 07/13/18 1201 07/13/18 1215  BP: 127/61 (!) 136/57 110/60 (!) 95/55  Pulse: 93 92 90 87  Resp:      Temp: 99.1 F (37.3 C)     TempSrc: Oral     SpO2: 98% 99% 97%   Weight:      Height:        Intake/Output Summary (Last 24 hours) at  07/13/2018 1335 Last data filed at 07/13/2018 1000 Gross per 24 hour  Intake 200 ml  Output 900 ml  Net -700 ml   Filed Weights   07/11/18 1218  Weight: 90.7 kg   Examination: Physical Exam:  Constitutional: Well-nourished, well-developed obese Caucasian female currently in no acute distress appears calm but does appear a little uncomfortable and states that she is cold Eyes: Lids and conjunctive are normal. Sclera anicteric ENMT: External ears and nose appear normal.  Grossly normal hearing Neck: Appears supple no JVD Respiratory: Slightly diminished auscultation bilaterally no appreciable wheezing, rales, car.  Patient not tachypneic or using any accessory muscle breathe Cardiovascular: Regular rate rhythm.  Mild lower extremity edema Abdomen: Soft, nontender, distended secondary body habitus.  Bowel sounds present GU: Deferred Musculoskeletal: No contractures or cyanosis.  No joint deformities in upper or lower extremities Skin: No appreciable rashes or lesions on limited skin evaluation. Neurologic: Cranial nerves II through XII grossly intact no appreciable focal deficits.  Romberg sign cerebellar reflexes were not assessed Psychiatric: Normal judgment and insight.  Patient is awake, alert, oriented x3.  Not as anxious  Data Reviewed: I have personally reviewed following labs and imaging studies  CBC: Recent Labs  Lab 07/11/18 1257 07/12/18 0920 07/13/18 0414  WBC 3.6* 3.0* 3.3*  NEUTROABS 2.4 2.1 2.1  HGB 9.8* 9.6* 9.4*  HCT 32.1* 32.2* 32.3*  MCV 79.9* 80.9 81.4  PLT 251 230 230   Basic Metabolic Panel: Recent Labs  Lab 07/11/18 1257 07/12/18 0920 07/13/18 0414  NA 136 136 138  K 4.2 3.4* 4.3  CL 94* 97* 101  CO2 29 29 29   GLUCOSE 148* 180* 117*  BUN 14 12 11   CREATININE 0.93 0.89 0.77  CALCIUM 9.7 9.4 9.8  MG  --  1.7 1.7  PHOS  --  3.3 2.8   GFR: Estimated Creatinine Clearance: 71 mL/min (by C-G formula based on SCr of 0.77 mg/dL). Liver Function  Tests: Recent Labs  Lab 07/11/18 1257 07/12/18 0920 07/13/18 0414  AST 36 21 19  ALT 18 17 16   ALKPHOS 104 93 97  BILITOT 1.0 0.4 0.6  PROT 8.1 7.2 7.4  ALBUMIN 3.6 3.3* 3.4*   No results for input(s): LIPASE, AMYLASE in the last 168 hours. No results for input(s): AMMONIA in the last 168 hours. Coagulation Profile: Recent Labs  Lab 07/13/18 0414  INR 1.3*   Cardiac Enzymes: No results for input(s): CKTOTAL, CKMB, CKMBINDEX, TROPONINI in the last 168 hours. BNP (last 3 results) No results for input(s): PROBNP in the last 8760 hours. HbA1C: No results for input(s): HGBA1C in the last 72 hours. CBG: Recent Labs  Lab 07/12/18 1151 07/12/18 1650 07/12/18 2126 07/13/18 0726 07/13/18 1138  GLUCAP 124* 119* 132* 137* 136*   Lipid Profile: No results for input(s): CHOL, HDL, LDLCALC, TRIG, CHOLHDL, LDLDIRECT in the last 72 hours. Thyroid Function Tests: No results for input(s): TSH, T4TOTAL, FREET4, T3FREE, THYROIDAB in the last 72 hours. Anemia Panel: No results for input(s): VITAMINB12, FOLATE, FERRITIN, TIBC, IRON, RETICCTPCT in the last 72 hours. Sepsis Labs: No results for input(s): PROCALCITON, LATICACIDVEN in the last 168 hours.  Recent Results (from the past 240 hour(s))  Culture, blood (routine x 2)     Status: None (Preliminary result)   Collection Time: 07/11/18  4:52 PM  Result Value Ref Range Status   Specimen Description   Final    BLOOD LEFT ARM Performed at [facility], [address] [place], [place] [address]    Special Requests   Final    BOTTLES DRAWN AEROBIC AND ANAEROBIC Blood Culture adequate volume   Culture   Final    NO GROWTH 2 DAYS Performed at [facility], [address], [place], [place] [address]    Report Status PENDING  Incomplete  Culture, blood (routine x 2)     Status: None (Preliminary result)   Collection Time: 07/11/18  4:58 PM  Result Value Ref Range Status   Specimen Description   Final     BLOOD LEFT ARM Performed at [facility], [address] [place], [place] [address]    Special Requests   Final    BOTTLES DRAWN AEROBIC AND ANAEROBIC Blood Culture adequate volume   Culture   Final    NO GROWTH 2 DAYS Performed at [facility], [address], [place], [place] [address]    Report Status PENDING  Incomplete  Aerobic/Anaerobic Culture (surgical/deep  wound)     Status: None (Preliminary result)   Collection Time: 07/13/18 11:12 AM  Result Value Ref Range Status   Specimen Description   Final    ABSCESS LT PSOAS Performed at [facility], [address] [place], [place] [address]    Special Requests   Final    NONE Performed at [facility], [address] [place], [place] [address]    Gram Stain   Final    NO WBC SEEN NO ORGANISMS SEEN Performed at [facility], [address], [place], [place] [address]    Culture PENDING  Incomplete   Report Status PENDING  Incomplete     Radiology Studies: Mr Lumbar Spine W Wo Contrast  Result Date: 07/11/2018 CLINICAL DATA:  Spinal infection.  Increased back pain. EXAM: MRI LUMBAR SPINE WITHOUT AND WITH CONTRAST TECHNIQUE: Multiplanar and multiecho pulse sequences of the lumbar spine were obtained without and with intravenous contrast. CONTRAST:  10 mL Gadovist IV COMPARISON:  MRI lumbar spine 06/02/2018 FINDINGS: Segmentation:  Image quality degraded by significant motion Normal segmentation Alignment: Approximately 5 mm retrolisthesis L2-3 unchanged. Slight anterolisthesis L5-S1. Vertebrae: Progressive discitis and osteomyelitis at L1-2. Increased fluid in the disc space with endplate erosive changes which have progressed significantly. Diffuse bone marrow edema and enhancement throughout L1 and L2 vertebral bodies. No other levels of infection in the spine. Conus medullaris and cauda equina: Conus extends to the L1-2 level. Conus and cauda equina appear  normal. Paraspinal and other soft tissues: Extensive paraspinous soft tissue thickening and enhancement around the L1-2 disc space compatible with infection in the soft tissues. 1 cm anterior paraspinous soft tissue abscesses have become more well-defined. Left psoas abscess measuring 17 mm has become more well-defined and enlarged. Diffuse myositis with enhancement throughout the psoas muscle bilaterally also with progression. Probable myositis in the erector spinae muscles bilaterally which shows diffuse enhancement. Multilocular cystic mass in the uncinate process of the pancreas measuring approximately 25 x 30 mm unchanged from prior studies. See MRI pancreas report 06-16-18. Disc levels: T12-L1: Negative disc space. Ventral epidural fluid collection extending from T11 through L1 has improved in the interval although today's study is degraded by significant motion. L1-2: Progression of discitis and osteomyelitis with progressive endplate destruction. 4 mm ventral epidural abscess in the midline appears similar to the prior study. Diffuse dural thickening and enhancement at L1 and L2 likely has progressed although the prior study was not done with intravenous contrast. Nonenhancing extradural fluid collection posteriorly on the left has progressed. L2-3: 5 mm retrolisthesis. Advanced disc degeneration with disc bulging and spurring. Bilateral facet degeneration. Severe spinal stenosis unchanged from the prior study. Extradural fluid collection on the left of the thecal sac at L2-3 has progressed and may represent abscess or reactive effusion. L3-4: Disc bulging and spurring. Bilateral facet degeneration. Mild spinal stenosis L4-5: Mild anterolisthesis. Disc and facet degeneration with mild spinal stenosis L5-S1: Mild anterolisthesis with moderate facet degeneration. Moderate subarticular stenosis bilaterally. IMPRESSION: 1. Image quality degraded by significant motion 2. Progression of discitis and  osteomyelitis at L1-2. Progressive fluid/pus in the disc space extending into the anterior paraspinous soft tissues compatible with progressive abscess. Progressive abscess in the left psoas muscle. Progressive myositis throughout the psoas muscles bilaterally and the erector spinae muscles bilaterally. Posterior extradural fluid collection on the left at L1-2 has progressed in the interval and may represent abscess. Diffuse dural thickening and small ventral epidural abscess at L1-2 difficult to  compare as intravenous contrast not given on the prior study. No new levels of infection 3. Multilevel spondylosis with severe spinal stenosis at L2-3 due to degenerative change. Electronically Signed   By: Marlan Palau M.D.   On: 07/11/2018 15:04   Ct Aspiration  Result Date: 07/13/2018 INDICATION: 68 year old with history of L1-L2 discitis. Recent MRI demonstrated progression of the discitis and osteomyelitis at L1-L2 with fluid extending into the paraspinal soft tissues including the left psoas muscle. Previous disc aspiration on 06/03/2018. Request for repeat aspiration and culture. EXAM: CT-GUIDED LEFT PSOAS MUSCLE ASPIRATION MEDICATIONS: No antibiotics given for the procedure. ANESTHESIA/SEDATION: Fentanyl 100 mcg IV; Versed 3.0 mg IV Moderate Sedation Time:  39 minutes The patient was continuously monitored during the procedure by the interventional radiology nurse under my direct supervision. COMPLICATIONS: None immediate. PROCEDURE: Informed written consent was obtained from the patient after a thorough discussion of the procedural risks, benefits and alternatives. All questions were addressed. A timeout was performed prior to the initiation of the procedure. CT images were obtained with the patient in prone position. The left psoas musculature at the level of L1-L2 was targeted. The overlying skin was prepped with chlorhexidine and a sterile field was created. Skin and soft tissues were anesthetized with 1%  lidocaine. 18 gauge trocar needle was directed into the left psoas muscle multiple times. No purulent fluid could be aspirated. Few drops of bloody fluid was aspirated as the needle was withdrawn. Needle was repositioned multiple times. FINDINGS: Inflammatory changes at L1-L2 compatible with discitis. Needle was directed into the left psoas muscle at L1-L2. Discrete fluid collections are not clearly identified with CT. Needle position was directed based on the recent MRI findings. No purulent fluid was aspirated. Few drops of bloody fluid was removed as the needles were pulled back. IMPRESSION: CT-guided aspiration of the left psoas muscle. Few drops of bloody fluid was obtained from the left psoas musculature. No purulent fluid obtained. If the culture samples are negative, we could consider another dedicated disc aspiration. Electronically Signed   By: Richarda Overlie M.D.   On: 07/13/2018 12:37   Scheduled Meds: . amLODipine  5 mg Oral QHS  . ARIPiprazole  5 mg Oral Daily  . clonazePAM  0.5 mg Oral QHS  . fenofibrate  54 mg Oral Q supper  . fentaNYL  1 patch Transdermal Q72H  . fentaNYL      . FLUoxetine  60 mg Oral QHS  . gabapentin  100 mg Oral 3 times per day  . insulin aspart  0-5 Units Subcutaneous QHS  . insulin aspart  0-9 Units Subcutaneous TID WC  . insulin glargine  20 Units Subcutaneous BID  . metoprolol tartrate  50 mg Oral TID  . midazolam      . pantoprazole  40 mg Oral Daily  . polyethylene glycol  17 g Oral Daily  . propylthiouracil  75 mg Oral Daily  . rOPINIRole  3 mg Oral QHS  . sodium chloride flush  3 mL Intravenous Q12H   Continuous Infusions: . sodium chloride    . [START ON 07/14/2018] vancomycin    . vancomycin      LOS: 2 days    Merlene Laughter, DO [facility] PAGER is on AMION  If 7PM-7AM, please contact night-coverage [email or web address] Password [email or web address] 07/13/2018, 1:35 PM

## 2018-07-13 NOTE — Procedures (Signed)
Interventional Radiology Procedure:   Indications: L1-L2 discitis with fluid collection in left psoas   Procedure: CT aspiration of left psoas  Findings: Only a few drops of blood was aspirated from left psoas site.    Complications: None     EBL: Less than 10 ml  Plan: Send fluid for culture.     [name] R. Anselm Pancoast, MD  Pager: [phone]

## 2018-07-13 NOTE — Progress Notes (Signed)
    [facility] for Infectious Disease   Reason for visit: Follow up on discitis and abscess  Interval History: afebrile, WBC stable;. No changes.  IR was unable to drain the abscess, no drain in place.  Only a few drops aspirated.   Physical Exam: Constitutional:  Vitals:   07/13/18 1100 07/13/18 1134  BP: (!) 157/116 127/61  Pulse: (!) 102 93  Resp: 15   Temp:  99.1 F (37.3 C)  SpO2: 97% 98%   patient appears in NAD, resting comfortably Respiratory: Normal respiratory effort; CTA B Cardiovascular: RRR   Review of Systems: Constitutional: negative for fevers and chills Gastrointestinal: negative for nausea and diarrhea  Lab Results  Component Value Date   WBC 3.3 (L) 07/13/2018   HGB 9.4 (L) 07/13/2018   HCT 32.3 (L) 07/13/2018   MCV 81.4 07/13/2018   PLT 230 07/13/2018    Lab Results  Component Value Date   CREATININE 0.77 07/13/2018   BUN 11 07/13/2018   NA 138 07/13/2018   K 4.3 07/13/2018   CL 101 07/13/2018   CO2 29 07/13/2018    Lab Results  Component Value Date   ALT 16 07/13/2018   AST 19 07/13/2018   ALKPHOS 97 07/13/2018     Microbiology: Recent Results (from the past 240 hour(s))  Culture, blood (routine x 2)     Status: None (Preliminary result)   Collection Time: 07/11/18  4:52 PM  Result Value Ref Range Status   Specimen Description   Final    BLOOD LEFT ARM Performed at [facility], [place] [address], [place], [place] [address]    Special Requests   Final    BOTTLES DRAWN AEROBIC AND ANAEROBIC Blood Culture adequate volume   Culture   Final    NO GROWTH 2 DAYS Performed at [facility], [place] [address] [place], [place] [address]    Report Status PENDING  Incomplete  Culture, blood (routine x 2)     Status: None (Preliminary result)   Collection Time: 07/11/18  4:58 PM  Result Value Ref Range Status   Specimen Description   Final    BLOOD LEFT ARM Performed at [facility]  [address] [place], [place] [address]    Special Requests   Final    BOTTLES DRAWN AEROBIC AND ANAEROBIC Blood Culture adequate volume   Culture   Final    NO GROWTH 2 DAYS Performed at [facility], [place] [address], [place], [place] [address]    Report Status PENDING  Incomplete    Impression/Plan:  1. Discitits/osteomyelitis - MRSE on cultures previously. Has been on vancomycin.    2. Psoas and epidural abscess - no successful drainage yet. Needs source control.  Since IR unable/unsuccessful in attempt to drain, would re engage neurosurgery for ? Surgical management.

## 2018-07-13 NOTE — Progress Notes (Signed)
Referring Physician(s): Rizwan,S/Narvaez,R  Supervising Physician: Markus Daft  Patient Status:  [facility] - In-pt  Chief Complaint: Back pain, persistent lumbar discitis/osteomyelitis   Subjective: Patient familiar to IR service from prior PICC lines in 2013, attempted right percutaneous nephrostomy in 2015 and L1-2 disc aspiration on 06/03/2018 with cultures yielding Staphylococcus epidermidis.  Urine cultures from March of this year also revealed Pseudomonas and Proteus.  Patient has been receiving IV vancomycin at home.  She recently presented to [facility] on 4/21 with persistent and worsening back pain as well as fever.  Subsequent imaging revealed: Progression of discitis and osteomyelitis at L1-2. Progressive fluid/pus in the disc space extending into the anterior paraspinous soft tissues compatible with progressive abscess. Progressive abscess in the left psoas muscle. Progressive myositis throughout the psoas muscles bilaterally and the erector spinae muscles bilaterally. Posterior extradural fluid collection on the left at L1-2 has progressed in the interval and may represent abscess. Diffuse dural thickening and small ventral epidural abscess at L1-2 difficult to compare as intravenous contrast not given on the prior study. No new levels of infection  Multilevel spondylosis with severe spinal stenosis at L2-3 due to degenerative change.   Request now received from [facility]/ infectious disease as well as neurosurgery for repeat L1-2 disc space/psoas region aspiration for further evaluation.  Additional medical history as below.   Past Medical History:  Diagnosis Date  . Anemia   . Arthritis   . Asthma    OCCAS  . Bell's palsy   . Broken neck ([place])    C-1  . Carbuncle and furuncle of trunk   . Chronic dislocation of right shoulder   . Depression   . Disorder of fascia    HX OF NECROTIC FASCITIS AFTER ABDOMINAL SURGERY FOR HERNIA- REQUIRED 19 SURGERIES AND 2.5  MONTH HOSPITALIZATION AT [facility]  . DM (diabetes mellitus) ([place])   . Dysrhythmia    HX OF TACHYCARDIA AND BRADYCARDIA - ON GOING FOR YEARS - DOES NOT HAVE TO SEE CARDIOLOGIST  . Elevated cholesterol   . Fibromyalgia   . Fracture MAY 2014   HX OF FRACTURED NECK C1- CAUSES SEVERE HEADACHES--LIMITED ROM NECK  . Frequent infections    ESPECIALLY PRONE TO INFECTIONS AFTER SURGERIES  . Graves disease   . Heart murmur    DOES NOT CAUSE ANY PROBLEMS  . History of kidney stones   . Hyperlipidemia   . Hypertension   . Hypothyroidism    GRAVES DISEASE  . Migraine   . 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 Neuropathy   . Obesity   . OSA (obstructive sleep apnea)    USES CPAP - DOES NOT KNOW SETTING  . Pain    LEFT SHOULDER  PAIN -HARD TO LIE ON LEFT SIDE FOR LONG PERIOD;  PAIN IN LOWER BADK - 3 HERNIATED DISCS AND STENOISIS -  . PONV (postoperative nausea and vomiting)    THE GAS MAKES ME NAUSEATED  . Restless leg syndrome   . Sciatica   . Tachycardia   . Urinary frequency   . Urticaria   . UTI (urinary tract infection)    Past Surgical History:  Procedure Laterality Date  . ABDOMINAL HYSTERECTOMY     LARGE TUMOR AT OVARY REMOVED  . APPENDECTOMY    . CARPAL TUNNEL RELEASE     BILATERAL  . CATARACT EXTRACTION W/ INTRAOCULAR LENS  IMPLANT, BILATERAL    . CESAREAN SECTION  X 3  . CHOLECYSTECTOMY    . CYSTOSCOPY WITH URETEROSCOPY Right 12/03/2013   Procedure: CYSTOSCOPY WITH RIGHT RETROGRADE URETEROSCOPY LASER LITHOTRIPSY RIGHT STONE RIGHT URETERAL STENT, ;  Surgeon: Raynelle Bring, MD;  Location: [facility];  Service: Urology;  Laterality: Right;  PROCEDURE WAS ORIGINALLY SCHEDULED AS RIGHT PERCUTANEOUS NEPHROLITHOTOMY  . EYELID LACERATION REPAIR     RIGHT EYE  . HERNIA REPAIR     ABDOMINAL HERNIA REPAIR WITH MESH - 3 SURGERIES   . HX OF 19 SURGERIES FOR NECROTIC FASCITIS     SKIN GRAFTS+ WOUND  VAC  . I&D OF INFECTED SITE IN [place] - FROM AN INJECTION    .  IR FLUORO GUIDED NEEDLE PLC ASPIRATION/INJECTION LOC  06/03/2018  . JOINT REPLACEMENT     TOTAL RIGHT KNEE REPLACEMENT  . KNEE ARTHROSCOPY  RIGHT AND LEFT   X 2  . [place] RESECTION  2014  . NEPHROLITHOTOMY Right 12/10/2013   Procedure: NEPHROLITHOTOMY PERCUTANEOUS;  Surgeon: Raynelle Bring, MD;  Location: [facility];  Service: Urology;  Laterality: Right;  . REVERSE SHOULDER ARTHROPLASTY Right 07/21/2017  . REVERSE SHOULDER ARTHROPLASTY Right 07/21/2017   Procedure: RIGHT REVERSE SHOULDER ARTHROPLASTY;  Surgeon: Tania Ade, MD;  Location: [place];  Service: Orthopedics;  Laterality: Right;  . RIGHT FOOT DRAINAGE OF INFECTION    . shoulder arthroscopy Right    X 2  . TONSILLECTOMY     AND ADENOIDECTOMY      Allergies: Imitrex [sumatriptan]; Nsaids; Codeine; Promethazine hcl; Rosuvastatin calcium; Statins; Sulfonamide derivatives; and Vicodin [hydrocodone-acetaminophen]  Medications: Prior to Admission medications   Medication Sig Start Date End Date Taking? Authorizing Provider  amLODipine (NORVASC) 5 MG tablet Take 5 mg by mouth at bedtime.  12/24/16  Yes [provider]  ARIPiprazole (ABILIFY) 5 MG tablet Take 1 tablet (5 mg total) by mouth daily. 12/24/17  Yes Hisada, Elie Goody, MD  Cholecalciferol (VITAMIN D3 PO) Take 1 tablet by mouth daily.    Yes [provider]  clonazePAM (KLONOPIN) 0.5 MG tablet Take 0.5 mg by mouth at bedtime as needed for anxiety. Up to 3 times daily as needed for anxiety, take 1 tablet (0.5 mg) scheduled each night at bedtime. 07/01/14  Yes [provider]  Cyanocobalamin (VITAMIN B 12 PO) Take 1 tablet by mouth daily.    Yes [provider]  cyclobenzaprine (FLEXERIL) 10 MG tablet Take 10 mg by mouth See admin instructions. Take 1 tablet (10 mg) every night at bedtime, may take an additional tablet during the day as needed for muscle spasms 07/15/14  Yes [provider]  fenofibrate 54 MG tablet Take 54  mg by mouth daily with supper. 06/20/17  Yes [provider]  FLUoxetine (PROZAC) 20 MG capsule Take 60 mg by mouth at bedtime.  06/28/17  Yes [provider]  furosemide (LASIX) 20 MG tablet Take 20-40 mg by mouth See admin instructions. Take 2 tablets (40 mg) in the morning & 1 tablet (20 mg) with lunch.    Yes [provider]  gabapentin (NEURONTIN) 100 MG capsule Take 100 mg by mouth 3 (three) times daily.    Yes [provider]  LANTUS 100 UNIT/ML injection Inject 0-35 Units into the skin See admin instructions. Based on sliding scale twice daily 06/28/17  Yes [provider]  metoprolol (LOPRESSOR) 50 MG tablet Take 50 mg by mouth 3 (three) times daily.  06/28/14  Yes [provider]  morphine (MS CONTIN) 15 MG 12 hr  tablet Take 15 mg by mouth 2 (two) times daily. 07/03/18  Yes [provider]  MOVANTIK 25 MG TABS tablet Take 25 mg by mouth daily. 07/09/18  Yes [provider]  NOVOLOG 100 UNIT/ML injection Inject 2-12 Units into the skin See admin instructions. Sliding scale twice daily 12/01/17  Yes [provider]  nystatin (MYCOSTATIN) 100000 UNIT/ML suspension Use as directed 5 mLs in the mouth or throat 4 (four) times daily.  06/08/18  Yes [provider]  omeprazole (PRILOSEC) 20 MG capsule Take 20 mg by mouth daily with supper.    Yes [provider]  ondansetron (ZOFRAN) 4 MG tablet Take 4 mg by mouth 2 (two) times daily as needed. For nausea/vomiting prevention with pain medication. 06/02/17  Yes [provider]  oxyCODONE (OXY IR/ROXICODONE) 5 MG immediate release tablet Take 5-10 mg by mouth every 4 (four) hours as needed for moderate pain. 07/03/18  Yes [provider]  potassium chloride SA (K-DUR,KLOR-CON) 20 MEQ tablet Take 20 mEq by mouth daily with lunch. 07/02/17  Yes [provider]  PREVIDENT 5000 DRY MOUTH 1.1 % GEL dental gel Take 1 application by mouth daily.  02/08/18  Yes [provider]  propylthiouracil (PTU) 50 MG tablet Take 75 mg by mouth daily.  12/22/16  Yes [provider]  rOPINIRole (REQUIP) 2 MG tablet Take 3 mg by mouth at bedtime.    Yes [provider]  vancomycin IVPB Inject 1,000 mg into the vein daily. Indication:  MRSE bacteremia and discitis  Last Day of Therapy:  07/16/2018 Labs - Sunday/Monday:  CBC/D, BMP, and vancomycin trough. Labs - Thursday:  BMP and vancomycin trough Labs - Every other week:  ESR and CRP 06/06/18 07/16/18 Yes Elodia Florence., MD  [facility] 4 MG/0.1ML LIQD nasal spray kit Place 1 Dose into the nose once as needed for other. Over dose 07/05/18   [provider]     Vital Signs: BP (!) 161/68 (BP Location: Left Arm)   Pulse 90   Temp 98.4 F (36.9 C) (Oral)   Resp 18   Ht '5\' 1"'  (1.549 m)   Wt 200 lb (90.7 kg)   SpO2 96%   BMI 37.79 kg/m   Physical Exam patient awake, alert.  Chest with distant but clear breath sounds bilaterally.  Heart with reg rate and rhythm.  Abdomen obese, soft, positive bowel sounds, mildly tender right and left lower quadrant regions; no significant lower extremity edema.  Imaging: Mr Lumbar Spine W Wo Contrast  Result Date: 07/11/2018 CLINICAL DATA:  Spinal infection.  Increased back pain. EXAM: MRI LUMBAR SPINE WITHOUT AND WITH CONTRAST TECHNIQUE: Multiplanar and multiecho pulse sequences of the lumbar spine were obtained without and with intravenous contrast. CONTRAST:  10 mL Gadovist IV COMPARISON:  MRI lumbar spine 06/02/2018 FINDINGS: Segmentation:  Image quality degraded by significant motion Normal segmentation Alignment: Approximately 5 mm retrolisthesis L2-3 unchanged. Slight anterolisthesis L5-S1. Vertebrae: Progressive discitis and osteomyelitis at L1-2. Increased fluid in the disc space with endplate erosive changes which have progressed significantly. Diffuse bone marrow edema and enhancement throughout L1 and L2 vertebral  bodies. No other levels of infection in the spine. Conus medullaris and cauda equina: Conus extends to the L1-2 level. Conus and cauda equina appear normal. Paraspinal and other soft tissues: Extensive paraspinous soft tissue thickening and enhancement around the L1-2 disc space compatible with infection in the soft tissues. 1 cm anterior paraspinous soft tissue abscesses have become more well-defined.  Left psoas abscess measuring 17 mm has become more well-defined and enlarged. Diffuse myositis with enhancement throughout the psoas muscle bilaterally also with progression. Probable myositis in the erector spinae muscles bilaterally which shows diffuse enhancement. Multilocular cystic mass in the uncinate process of the pancreas measuring approximately 25 x 30 mm unchanged from prior studies. See MRI pancreas report July 12, 2018. Disc levels: T12-L1: Negative disc space. Ventral epidural fluid collection extending from T11 through L1 has improved in the interval although today's study is degraded by significant motion. L1-2: Progression of discitis and osteomyelitis with progressive endplate destruction. 4 mm ventral epidural abscess in the midline appears similar to the prior study. Diffuse dural thickening and enhancement at L1 and L2 likely has progressed although the prior study was not done with intravenous contrast. Nonenhancing extradural fluid collection posteriorly on the left has progressed. L2-3: 5 mm retrolisthesis. Advanced disc degeneration with disc bulging and spurring. Bilateral facet degeneration. Severe spinal stenosis unchanged from the prior study. Extradural fluid collection on the left of the thecal sac at L2-3 has progressed and may represent abscess or reactive effusion. L3-4: Disc bulging and spurring. Bilateral facet degeneration. Mild spinal stenosis L4-5: Mild anterolisthesis. Disc and facet degeneration with mild spinal stenosis L5-S1: Mild anterolisthesis with moderate facet  degeneration. Moderate subarticular stenosis bilaterally. IMPRESSION: 1. Image quality degraded by significant motion 2. Progression of discitis and osteomyelitis at L1-2. Progressive fluid/pus in the disc space extending into the anterior paraspinous soft tissues compatible with progressive abscess. Progressive abscess in the left psoas muscle. Progressive myositis throughout the psoas muscles bilaterally and the erector spinae muscles bilaterally. Posterior extradural fluid collection on the left at L1-2 has progressed in the interval and may represent abscess. Diffuse dural thickening and small ventral epidural abscess at L1-2 difficult to compare as intravenous contrast not given on the prior study. No new levels of infection 3. Multilevel spondylosis with severe spinal stenosis at L2-3 due to degenerative change. Electronically Signed   By: Franchot Gallo M.D.   On: 07/11/2018 15:04    Labs:  CBC: Recent Labs    06/07/18 0500 07/11/18 1257 07/12/18 0920 07/13/18 0414  WBC 3.3* 3.6* 3.0* 3.3*  HGB 8.6* 9.8* 9.6* 9.4*  HCT 28.3* 32.1* 32.2* 32.3*  PLT 211 251 230 230    COAGS: Recent Labs    07/13/17 1040 06/03/18 0852 07/13/18 0414  INR 1.05 1.2 1.3*  APTT 33  --   --     BMP: Recent Labs    06/07/18 0500 07/11/18 1257 07/12/18 0920 07/13/18 0414  NA 136 136 136 138  K 3.8 4.2 3.4* 4.3  CL 102 94* 97* 101  CO2 '27 29 29 29  ' GLUCOSE 104* 148* 180* 117*  BUN '15 14 12 11  ' CALCIUM 8.9 9.7 9.4 9.8  CREATININE 0.95 0.93 0.89 0.77  GFRNONAA >60 >60 >60 >60  GFRAA >60 >60 >60 >60    LIVER FUNCTION TESTS: Recent Labs    06/07/18 0500 07/11/18 1257 07/12/18 0920 07/13/18 0414  BILITOT 0.4 1.0 0.4 0.6  AST 13* 36 21 19  ALT '12 18 17 16  ' ALKPHOS 70 104 93 97  PROT 6.6 8.1 7.2 7.4  ALBUMIN 2.6* 3.6 3.3* 3.4*    Assessment and Plan: Patient with history of persistent and worsening back pain secondary to known L1-2 discitis/osteomyelitis ,fever, prior aspiration of  L1-2 disc space with cultures yielding staph epidermidis. UTI in March of this year with Pseudomonas and Proteus.  Patient on IV vancomycin  at home.  Follow-up imaging now reveals progression of L1-2 osteomyelitis/discitis with associated psoas/epidural abscesses (pt also with noted pancreatic cystic lesion).  Request received for image guided aspiration of L1 -2 disc space/psoas region for further evaluation.  Imaging studies have been reviewed by Dr. Anselm Pancoast.  Current labs include WBC 3.3, hemoglobin 9.4, platelets 230k, PT 16.1, INR 1.3, creatinine 0.77.  Details/risks of procedure, including but not limited to, internal bleeding, infection, injury to adjacent structures discussed with patient with her understanding and consent.  Procedure scheduled for today.   Electronically Signed: D. Rowe Robert, PA-C 07/13/2018, 8:52 AM   I spent a total of 20 minutes at the the patient's bedside AND on the patient's hospital floor or unit, greater than 50% of which was counseling/coordinating care for image guided aspiration of L1-2 disc space/psoas region    Patient ID: [name], female   DOB: November 18, 1950, 68 y.o.   MRN: [id number]

## 2018-07-13 NOTE — Plan of Care (Signed)

## 2018-07-13 NOTE — Progress Notes (Signed)
Pharmacy Antibiotic Note  [name] is a 68 y.o. female admitted on 07/11/2018 with OM.  Pharmacy has been consulted for vanocmycin dosing.  Patient admitted on 4/21 with worsening back pain and fever. She was diagnosed with lumbar discitis caused by methicillin resistant CoNS in March 2020 and was on vancomycin PTA. Vancomycin course stated 06/03/18 with a planned 6 week duration, expected to end on 07/16/18. Most recent regimen was vancomycin 1000 mg IV q24h. Last dose of vancomycin PTA was 07/10/18, antibiotics have been held since admission.  Pt underwent image guided aspiration on 4/23 and pharmacy consulted to resume vancomycin.  Today, 07/13/18  WBC 3.3  Afebrile  SCr 0.8 - WNL, stable on admission  Last dose of vancomycin 07/10/18  Plan:  Give vancomycin 2000 mg loading dose as pt has not had antibiotics in several days  Resume vancomycin 1000 mg IV q24h for estimated AUC ~550 and Vt of 15  Goal vancomycin AUC 400-550, VT 15-20 mcg/ML  Follow renal function and cultures  Check vancomycin levels once at steady state  Height: 5\' 1"  (154.9 cm) Weight: 200 lb (90.7 kg) IBW/kg (Calculated) : 47.8  Temp (24hrs), Avg:98.5 F (36.9 C), Min:97.9 F (36.6 C), Max:99.1 F (37.3 C)  Recent Labs  Lab 07/11/18 1257 07/12/18 0920 07/13/18 0414  WBC 3.6* 3.0* 3.3*  CREATININE 0.93 0.89 0.77    Estimated Creatinine Clearance: 71 mL/min (by C-G formula based on SCr of 0.77 mg/dL).    Allergies  Allergen Reactions  . Imitrex [Sumatriptan] Other (See Comments)    Vascular spasms  . Nsaids Other (See Comments)    PT UNABLE TO TOLERATE NSAID'S DRUGS DUE TO HX OF GASTRIC SLEEVE SURGERY  . Codeine Nausea Only  . Promethazine Hcl Other (See Comments)    Restless leg feeling all over body  . Rosuvastatin Calcium Other (See Comments)    Leg/muscle pain  . Statins Other (See Comments)    Leg/muscle pain  . Sulfonamide Derivatives Hives    Childhood allergy   . Vicodin  [Hydrocodone-Acetaminophen] Nausea And Vomiting    Antimicrobials this admission: Vancomycin 4/23 >>  Prior to admission: Vancomycin 06/03/18 > 07/10/18  Dose adjustments this admission:  Microbiology results: 4/21 BCx: Pending 4/23 Surgical/wound: Sent  Previous recent cultures: 3/17 Urine: >100K pseudomonas, proteus 3/14 aspiration of L1,L2 disc space: abundant staphylococcus epidermidis (oxacillin resistant)   Thank you for allowing pharmacy to be a part of this patient's care.  Lenis Noon, PharmD 07/13/2018 1:05 PM

## 2018-07-14 LAB — RETICULOCYTES
Immature Retic Fract: 22.3 % — ABNORMAL HIGH (ref 2.3–15.9)
RBC.: 3.8 MIL/uL — ABNORMAL LOW (ref 3.87–5.11)
Retic Count, Absolute: 79.4 10*3/uL (ref 19.0–186.0)
Retic Ct Pct: 2.1 % (ref 0.4–3.1)

## 2018-07-14 LAB — URINALYSIS, ROUTINE W REFLEX MICROSCOPIC
Bilirubin Urine: NEGATIVE
Glucose, UA: NEGATIVE mg/dL
Hgb urine dipstick: NEGATIVE
Ketones, ur: 5 mg/dL — AB
Leukocytes,Ua: NEGATIVE
Nitrite: NEGATIVE
Protein, ur: NEGATIVE mg/dL
Specific Gravity, Urine: 1.013 (ref 1.005–1.030)
pH: 6 (ref 5.0–8.0)

## 2018-07-14 LAB — CBC WITH DIFFERENTIAL/PLATELET
Abs Immature Granulocytes: 0 10*3/uL (ref 0.00–0.07)
Basophils Absolute: 0 10*3/uL (ref 0.0–0.1)
Basophils Relative: 0 %
Eosinophils Absolute: 0 10*3/uL (ref 0.0–0.5)
Eosinophils Relative: 0 %
HCT: 30.4 % — ABNORMAL LOW (ref 36.0–46.0)
Hemoglobin: 9.1 g/dL — ABNORMAL LOW (ref 12.0–15.0)
Immature Granulocytes: 0 %
Lymphocytes Relative: 19 %
Lymphs Abs: 0.6 10*3/uL — ABNORMAL LOW (ref 0.7–4.0)
MCH: 23.9 pg — ABNORMAL LOW (ref 26.0–34.0)
MCHC: 29.9 g/dL — ABNORMAL LOW (ref 30.0–36.0)
MCV: 80 fL (ref 80.0–100.0)
Monocytes Absolute: 0.4 10*3/uL (ref 0.1–1.0)
Monocytes Relative: 13 %
Neutro Abs: 2.1 10*3/uL (ref 1.7–7.7)
Neutrophils Relative %: 68 %
Platelets: 207 10*3/uL (ref 150–400)
RBC: 3.8 MIL/uL — ABNORMAL LOW (ref 3.87–5.11)
RDW: 15.9 % — ABNORMAL HIGH (ref 11.5–15.5)
WBC: 3.1 10*3/uL — ABNORMAL LOW (ref 4.0–10.5)
nRBC: 0 % (ref 0.0–0.2)

## 2018-07-14 LAB — GLUCOSE, CAPILLARY
Glucose-Capillary: 111 mg/dL — ABNORMAL HIGH (ref 70–99)
Glucose-Capillary: 97 mg/dL (ref 70–99)
Glucose-Capillary: 86 mg/dL (ref 70–99)
Glucose-Capillary: 152 mg/dL — ABNORMAL HIGH (ref 70–99)
Glucose-Capillary: 246 mg/dL — ABNORMAL HIGH (ref 70–99)

## 2018-07-14 LAB — FOLATE: Folate: 15.7 ng/mL (ref >5.9)

## 2018-07-14 LAB — IRON AND TIBC
Iron: 27 ug/dL — ABNORMAL LOW (ref 28–170)
Saturation Ratios: 10 % — ABNORMAL LOW (ref 10.4–31.8)
TIBC: 274 ug/dL (ref 250–450)
UIBC: 247 ug/dL

## 2018-07-14 LAB — COMPREHENSIVE METABOLIC PANEL
ALT: 17 U/L (ref 0–44)
AST: 18 U/L (ref 15–41)
Albumin: 3.3 g/dL — ABNORMAL LOW (ref 3.5–5.0)
Alkaline Phosphatase: 101 U/L (ref 38–126)
Anion gap: 10 (ref 5–15)
BUN: 13 mg/dL (ref 8–23)
CO2: 25 mmol/L (ref 22–32)
Calcium: 9.6 mg/dL (ref 8.9–10.3)
Chloride: 102 mmol/L (ref 98–111)
Creatinine, Ser: 0.69 mg/dL (ref 0.44–1.00)
GFR calc Af Amer: >60 mL/min (ref >60)
GFR calc non Af Amer: >60 mL/min (ref >60)
Glucose, Bld: 117 mg/dL — ABNORMAL HIGH (ref 70–99)
Potassium: 3.6 mmol/L (ref 3.5–5.1)
Sodium: 137 mmol/L (ref 135–145)
Total Bilirubin: 0.7 mg/dL (ref 0.3–1.2)
Total Protein: 7.3 g/dL (ref 6.5–8.1)

## 2018-07-14 LAB — MAGNESIUM: Magnesium: 1.8 mg/dL (ref 1.7–2.4)

## 2018-07-14 LAB — VITAMIN B12: Vitamin B-12: 2063 pg/mL — ABNORMAL HIGH (ref 180–914)

## 2018-07-14 LAB — PHOSPHORUS: Phosphorus: 3.4 mg/dL (ref 2.5–4.6)

## 2018-07-14 LAB — FERRITIN: Ferritin: 28 ng/mL (ref 11–307)

## 2018-07-14 MED ORDER — VANCOMYCIN HCL 10 G IV SOLR
1250.0000 mg | INTRAVENOUS | Status: DC
  Administered 2018-07-14 – 2018-07-16 (×3): 1250 mg via INTRAVENOUS
  Filled 2018-07-14 (×4): qty 1250

## 2018-07-14 MED ORDER — NYSTATIN 100000 UNIT/ML MT SUSP
5.0000 mL | Freq: Four times a day (QID) | OROMUCOSAL | Status: DC
  Administered 2018-07-14 – 2018-07-23 (×36): 500000 [IU] via ORAL
  Filled 2018-07-14 (×36): qty 5

## 2018-07-14 MED ORDER — FLUCONAZOLE IN SODIUM CHLORIDE 200-0.9 MG/100ML-% IV SOLN
200.0000 mg | INTRAVENOUS | Status: DC
  Administered 2018-07-15 – 2018-07-16 (×2): 200 mg via INTRAVENOUS
  Filled 2018-07-14 (×2): qty 100

## 2018-07-14 MED ORDER — FLUCONAZOLE IN SODIUM CHLORIDE 200-0.9 MG/100ML-% IV SOLN
200.0000 mg | Freq: Once | INTRAVENOUS | Status: AC
  Administered 2018-07-14: 200 mg via INTRAVENOUS
  Filled 2018-07-14: qty 100

## 2018-07-14 NOTE — Progress Notes (Addendum)
[facility] for Infectious Disease   Reason for visit: Follow up on discitis and abscess  Interval History: afebrile, WBC stable;. No changes. Considerable amount of pain  Physical Exam: Constitutional:  Vitals:   07/14/18 0429 07/14/18 0822  BP: (!) 113/51 (!) 157/66  Pulse: 87 98  Resp: 18 19  Temp: 98.4 F (36.9 C)   SpO2: 98% 98%   patient appears in NAD, tearful with exam and discussion due to pain Respiratory: Normal respiratory effort; CTA B Cardiovascular: RRR Skin: no rashes   Review of Systems: Constitutional: negative for fevers and chills Gastrointestinal: negative for nausea and diarrhea  Lab Results  Component Value Date   WBC 3.1 (L) 07/14/2018   HGB 9.1 (L) 07/14/2018   HCT 30.4 (L) 07/14/2018   MCV 80.0 07/14/2018   PLT 207 07/14/2018    Lab Results  Component Value Date   CREATININE 0.69 07/14/2018   BUN 13 07/14/2018   NA 137 07/14/2018   K 3.6 07/14/2018   CL 102 07/14/2018   CO2 25 07/14/2018    Lab Results  Component Value Date   ALT 17 07/14/2018   AST 18 07/14/2018   ALKPHOS 101 07/14/2018     Microbiology: Recent Results (from the past 240 hour(s))  Culture, blood (routine x 2)     Status: None (Preliminary result)   Collection Time: 07/11/18  4:52 PM  Result Value Ref Range Status   Specimen Description   Final    BLOOD LEFT ARM Performed at [facility], [place] [address] [place], [place] [address]    Special Requests   Final    BOTTLES DRAWN AEROBIC AND ANAEROBIC Blood Culture adequate volume   Culture   Final    NO GROWTH 3 DAYS Performed at [facility], [place] [address] [place], [place] [address]    Report Status PENDING  Incomplete  Culture, blood (routine x 2)     Status: None (Preliminary result)   Collection Time: 07/11/18  4:58 PM  Result Value Ref Range Status   Specimen Description   Final    BLOOD LEFT ARM Performed at [facility] [address],  [place], [place] [address]    Special Requests   Final    BOTTLES DRAWN AEROBIC AND ANAEROBIC Blood Culture adequate volume   Culture   Final    NO GROWTH 3 DAYS Performed at [facility], [place] [address], [place], [place] [address]    Report Status PENDING  Incomplete  Aerobic/Anaerobic Culture (surgical/deep wound)     Status: None (Preliminary result)   Collection Time: 07/13/18 11:12 AM  Result Value Ref Range Status   Specimen Description   Final    ABSCESS LT PSOAS Performed at [facility], [place] [address] [place], [place] [address]    Special Requests   Final    NONE Performed at [facility], [place] [address], [place], [place] [address]    Gram Stain   Final    NO WBC SEEN NO ORGANISMS SEEN Performed at [facility], [place] [address] [place],  [address]    Culture PENDING  Incomplete   Report Status PENDING  Incomplete    Impression/Plan:  1. Discitits/osteomyelitis - MRSE on cultures previously. Back on vancomycin.  Will need to prolong course due to abscess.   2. Psoas and epidural abscess - she has not had successful eradication with medical/antibiotic therapy alone for  nearly 6 weeks of treatment.  I do not think she will improve without drainage of the abscess.  Awaiting neurosurgery re-input.   I appreciate their insight.    3.  IR culture - no WBCs and no organisms.  No growth reported to date.   Dr. Johnnye Sima is available over the weekend if needed, otherwise I will follow up on Monday

## 2018-07-14 NOTE — Progress Notes (Signed)
Brief Pharmacy Note:  Pharmacy consulted for dosing of fluconazole for oropharyngeal candidiasis. CrCl > 50 ml/min.  Plan:  Fluconazole 200mg  IV q24h  F/u for ability to convert to PO   Lindell Spar, PharmD, BCPS Pager: [phone] 07/14/2018 1:24 PM

## 2018-07-14 NOTE — Care Management Important Message (Signed)
Important Message  Patient Details IM Letter given to the Case Manager to present to the Patient Name: [name] MRN: [id number] Date of Birth: [birth year]   Medicare Important Message Given:  Yes    [name] 07/14/2018, 11:15 AM

## 2018-07-14 NOTE — Progress Notes (Signed)
Pharmacy Antibiotic Note  [name] is a 68 y.o. female admitted on 07/11/2018 with OM.  Pharmacy has been consulted for vanocmycin dosing.  Patient admitted on 4/21 with worsening back pain and fever. She was diagnosed with lumbar discitis caused by methicillin resistant CoNS in March 2020 and was on vancomycin PTA. Vancomycin course stated 06/03/18 with a planned 6 week duration, expected to end on 07/16/18. Most recent regimen was vancomycin 1000 mg IV q24h. Last dose of vancomycin PTA was 07/10/18, antibiotics were held on admission and resumed on 4/23.  Pt underwent image guided aspiration on 4/23 and pharmacy consulted to resume vancomycin.  Today, 07/14/18  WBC 3.1  Afebrile  SCr 0.7 - WNL, stable  Plan:  Increase vancomycin to 1250 mg IV q24h  Goal vancomycin AUC 400-550, VT 15-20 mcg/ML  Follow renal function and cultures  Check vancomycin levels once at steady state  Height: 5\' 1"  (154.9 cm) Weight: 200 lb (90.7 kg) IBW/kg (Calculated) : 47.8  Temp (24hrs), Avg:98.7 F (37.1 C), Min:98.1 F (36.7 C), Max:99.1 F (37.3 C)  Recent Labs  Lab 07/11/18 1257 07/12/18 0920 07/13/18 0414 07/14/18 0326  WBC 3.6* 3.0* 3.3* 3.1*  CREATININE 0.93 0.89 0.77 0.69    Estimated Creatinine Clearance: 71 mL/min (by C-G formula based on SCr of 0.69 mg/dL).    Allergies  Allergen Reactions  . Imitrex [Sumatriptan] Other (See Comments)    Vascular spasms  . Nsaids Other (See Comments)    PT UNABLE TO TOLERATE NSAID'S DRUGS DUE TO HX OF GASTRIC SLEEVE SURGERY  . Codeine Nausea Only  . Promethazine Hcl Other (See Comments)    Restless leg feeling all over body  . Rosuvastatin Calcium Other (See Comments)    Leg/muscle pain  . Statins Other (See Comments)    Leg/muscle pain  . Sulfonamide Derivatives Hives    Childhood allergy   . Vicodin [Hydrocodone-Acetaminophen] Nausea And Vomiting    Antimicrobials this admission: Vancomycin 4/23 >>  Prior to  admission: Vancomycin 06/03/18 > 07/10/18  Dose adjustments this admission:  Microbiology results: 4/21 BCx: Pending 4/23 Surgical/wound: Pending  Previous recent cultures: 3/17 Urine: >100K pseudomonas, proteus 3/14 aspiration of L1,L2 disc space: abundant staphylococcus epidermidis (oxacillin resistant)   Thank you for allowing pharmacy to be a part of this patient's care.  Lenis Noon, PharmD 07/14/2018 7:53 AM

## 2018-07-14 NOTE — Progress Notes (Signed)
PROGRESS NOTE    [name]  BSJ:[id number] DOB: 1950/12/15 DOA: 07/11/2018 PCP: Tiffany Infante, MD  Brief Narrative:  HPI per Dr. Debbe Odea on 07/11/2018 [name] is a 68 y.o. female with medical history of DM 2 who presents with lower back pain. She is currently being treated for a lumbar discitis with Methicillin resistant coag neg staph with Vancomycin at home. She developed increasing back pain about 1 wk ago and noted a fever of about 100 degrees. She has had worsening of her pain with it radiating to her left buttock. She has no other complaints.   ED Course:  MRI lumbar spine> Progression of discitis and osteomyelitis at L1-2. Progressive fluid/pus in the disc space extending into the anterior paraspinous soft tissues compatible with progressive abscess. Progressive abscess in the left psoas muscle. Progressive myositis throughout the psoas muscles bilaterally and the erector spinae muscles bilaterally. Posterior extradural fluid collection on the left at L1-2 has progressed in the interval and may represent abscess  **Interim History Underwent CT aspiration 07/13/2018 and only a few drops of blood was aspirated from the Left Psoas Site.  ID consulted and because conservative management with IV antibiotics without source control had not effectively treated this issue she will need drainage for pus for source control so will need to re-discuss with Neurosurgery. Patient was started on IV Abx with IV Vancomycin per ID recommendations after attempted/unsuccessful drainage.   Assessment & Plan:   Principal Problem:   Abscess in epidural space of lumbar spine Active Problems:   DM (diabetes mellitus) (HCC)   Hypothyroidism   Obesity   Graves disease  Abscess in epidural space of lumbar spine associated with Discitis and Osteomyelitis of L1-L2; Previously MRSE Myositis in b/l psoas muscles; Progressive Abscess in Left Psoas Muscle -Contacted IR for drainage and they they  would like input from ID and Neurosurgery -ID Dr. Linus Salmons and Neurosurgery Dr. Ellene Route were consulted; Dr. Ellene Route of Neurosurgery advised that repeat needle biopsy of the elbow wound now to displace and aspirating some of the pus from the area.  Culture would be preferable to doing an open surgical decompression -Infectious diseases recommended drainage of the abscess and holding antibiotics pending drainage for culture and starting vancomycin after drainage -Underwent CT Aspiration but there was no significant drainage and there was only a few drops of blood aspirated from the Left Psoas site -Oral and IV medications ordered for pain control as below  -F/u blood cultures are NGTD at 3 Days -Aerobic/Anaerobic Cx sent and Gram Stain showed No WBC seen and No Organisms seen; CX showed NGTD at 3 Days -WBC went from 3.6 -> 3.0 -> 3.3-> 3.1 -Discussed with Dr. Zada Finders of Neurosurgery to weigh in now that Drainage was unsuccessful/limited and he feels that Neurosurgical Intervention is not currently warranted and recommending another course of prolonged Abx.   -Will need a prolonged Antibiotic Course due to Abscess and will continue IV Vancomycin for now; ID thinks she will not improve without drainage of the abscess and Neurosurgery does not feel like it needs to be drained.   Diabetes Mellitus Type 2 -C/w Lantus at lower than home dose at 20 units sq BID -Started Sensitive Novolog SS AC/HS  -CBGs ranging from 88-136  HTN -Changed Metoprolol Tartrate 50 mg po TID to 12.5 mg po TID and will discontinue Amlodipine 5 mg po qHS -Give 1 Liter Bolus and re-evaluate -BP dropped to 75/53 yesterday Afternoon; Now improved and BP is 136/86 -?  Related to Pain Medication; Currently not Symptomatic -Continue to Monitor Blood Pressure Carefully    Obesity Estimated body mass index is 37.79 kg/m as calculated from the following:   Height as of this encounter: 5\' 1"  (1.549 m).   Weight as of this encounter:  90.7 kg. -Weight Loss and Dietary Counseling given   Graves Disease -C/w Propylthiouracil 75 mg po Daily   Fibromyalgia/Depression/RLS -C/w Aripiprazole 5 mg po Daily, Fluoxetine 60 mg po qHS, Clonazepam 0.5 mg po qHS, and Ropinirole 3 mg po qHS  Chronic Pain Syndrome -C/w Gabapentin 100 mg po TID, Fentanyl 12 mcg/hr Patch q72h, and IV Hydromorphone 1 mg q3hprn Severe Pain and Oxycodone 10 mg po q4hprn Moderate Pain  -Bowel Regimen with Miralax 17 gram po Daily and   HLD -C/w Fenofibrate 54 m po qHS  GERD -C/w Pantoprazole 40 mg po Daily   Normocytic Anemia -Patient's Hb/Hct went from 9.8/32.1 -> 9.6/32.2 -> 9.4/32.3 -> 9.1/30.4 -Continue to Monitor for S/Sx of Bleeding -PT was 16.1 and INR was 1.3 -Checked Anemia Panel and showed iron level of 27, U IBC of 247, TIBC of 274, saturations of 10%, ferritin level 28, folate level 15.7, and vitamin B12 of 2063 -Repeat CBC in AM   Hypokalemia -Patient's K+ is now 3.6 -Continue to Monitor and Replete as Necessary -Repeat CMP in AM   Oral Candidiasis/Thursh -Nurse reported white patches on her tongue and likely from Abx -Start Nystatin Suspension -Given Fluconazole IV with Pharmacy to dose- -Continue to Monitor   DVT prophylaxis: SCDs; Will add Heparin 5,000 units sq q8h Code Status: FULL CODE Family Communication: No family present at bedside  Disposition Plan: Continue Further workup    Consultants:   Infectious Diseases  Neurosurgery  Interventional Radiology   Procedures: None   Antimicrobials:  Anti-infectives (From admission, onward)   Start     Dose/Rate Route Frequency Ordered Stop   07/15/18 1400  fluconazole (DIFLUCAN) IVPB 200 mg     200 mg 100 mL/hr over 60 Minutes Intravenous Every 24 hours 07/14/18 1322     07/14/18 1400  vancomycin (VANCOCIN) 1,000 mg in sodium chloride 0.9 % 250 mL IVPB  Status:  Discontinued     1,000 mg 250 mL/hr over 60 Minutes Intravenous Every 24 hours 07/13/18 1304 07/14/18  0752   07/14/18 1400  vancomycin (VANCOCIN) 1,250 mg in sodium chloride 0.9 % 250 mL IVPB     1,250 mg 166.7 mL/hr over 90 Minutes Intravenous Every 24 hours 07/14/18 0752     07/14/18 1400  fluconazole (DIFLUCAN) IVPB 200 mg     200 mg 100 mL/hr over 60 Minutes Intravenous  Once 07/14/18 1303 07/14/18 1549   07/13/18 1345  vancomycin (VANCOCIN) 2,000 mg in sodium chloride 0.9 % 500 mL IVPB     2,000 mg 250 mL/hr over 120 Minutes Intravenous  Once 07/13/18 1301 07/13/18 1615     Subjective: Seen and examined at bedside and she was resting and stated that she "hurt all over." No Nausea or vomiting. No CP or SOB. Later complained to the nurse about "Bumps on the roof of the mouth" and nursing described it as white lesions so she was started on Thrush treatment. No other concerns or complaints at this time.   Objective: Vitals:   07/13/18 2356 07/14/18 0429 07/14/18 0822 07/14/18 1301  BP: (!) 149/56 (!) 113/51 (!) 157/66 136/86  Pulse: 91 87 98 91  Resp: 18 18 19 18   Temp: 98.1 F (36.7 C) 98.4  F (36.9 C)  98.2 F (36.8 C)  TempSrc: Oral Oral  Oral  SpO2: 100% 98% 98% 99%  Weight:      Height:        Intake/Output Summary (Last 24 hours) at 07/14/2018 1552 Last data filed at 07/14/2018 1400 Gross per 24 hour  Intake 360 ml  Output 300 ml  Net 60 ml   Filed Weights   07/11/18 1218  Weight: 90.7 kg   Examination: Physical Exam:  Constitutional: Well-nourished, well-developed obese Caucasian female currently no acute distress she was awoken from sleep appears calm but states that she is uncomfortable and complaining of "pain all over" Eyes: Lids and conjunctive are normal.  Sclera anicteric ENMT: External ears nose appear normal.  Grossly normal hearing Neck: Appears supple no JVD Respiratory: Diminished auscultation bilaterally no appreciable wheezing, rales, rhonchi.  Patient is not tachypneic using accessory muscles to breathe Cardiovascular: Regular rate and rhythm.   Has mild lower extremity edema Abdomen: Soft, nontender, distended secondary body habitus.  Bowel sounds present GU: Deferred Musculoskeletal: No contractures or cyanosis.  No joint deformities in the upper lower extremities Skin: Skin is warm dry no appreciable rashes or lesions on to skin value Neurologic: Cranial nerves II through XII gross intact no appreciable focal deficits.  Romberg sign is cerebellar reflexes were not assessed Psychiatric: Normal judgment and insight.  Patient is awake, alert, oriented x3.  Was resting and awoken from sleep.  Data Reviewed: I have personally reviewed following labs and imaging studies  CBC: Recent Labs  Lab 07/11/18 1257 07/12/18 0920 07/13/18 0414 07/14/18 0326  WBC 3.6* 3.0* 3.3* 3.1*  NEUTROABS 2.4 2.1 2.1 2.1  HGB 9.8* 9.6* 9.4* 9.1*  HCT 32.1* 32.2* 32.3* 30.4*  MCV 79.9* 80.9 81.4 80.0  PLT 251 230 230 128   Basic Metabolic Panel: Recent Labs  Lab 07/11/18 1257 07/12/18 0920 07/13/18 0414 07/14/18 0326  NA 136 136 138 137  K 4.2 3.4* 4.3 3.6  CL 94* 97* 101 102  CO2 29 29 29 25   GLUCOSE 148* 180* 117* 117*  BUN 14 12 11 13   CREATININE 0.93 0.89 0.77 0.69  CALCIUM 9.7 9.4 9.8 9.6  MG  --  1.7 1.7 1.8  PHOS  --  3.3 2.8 3.4   GFR: Estimated Creatinine Clearance: 71 mL/min (by C-G formula based on SCr of 0.69 mg/dL). Liver Function Tests: Recent Labs  Lab 07/11/18 1257 07/12/18 0920 07/13/18 0414 07/14/18 0326  AST 36 21 19 18   ALT 18 17 16 17   ALKPHOS 104 93 97 101  BILITOT 1.0 0.4 0.6 0.7  PROT 8.1 7.2 7.4 7.3  ALBUMIN 3.6 3.3* 3.4* 3.3*   No results for input(s): LIPASE, AMYLASE in the last 168 hours. No results for input(s): AMMONIA in the last 168 hours. Coagulation Profile: Recent Labs  Lab 07/13/18 0414  INR 1.3*   Cardiac Enzymes: No results for input(s): CKTOTAL, CKMB, CKMBINDEX, TROPONINI in the last 168 hours. BNP (last 3 results) No results for input(s): PROBNP in the last 8760  hours. HbA1C: No results for input(s): HGBA1C in the last 72 hours. CBG: Recent Labs  Lab 07/13/18 1138 07/13/18 1646 07/13/18 2216 07/14/18 0718 07/14/18 1136  GLUCAP 136* 88 105* 111* 97   Lipid Profile: No results for input(s): CHOL, HDL, LDLCALC, TRIG, CHOLHDL, LDLDIRECT in the last 72 hours. Thyroid Function Tests: No results for input(s): TSH, T4TOTAL, FREET4, T3FREE, THYROIDAB in the last 72 hours. Anemia Panel: Recent Labs  07/14/18 0326  VITAMINB12 2,063*  FOLATE 15.7  FERRITIN 28  TIBC 274  IRON 27*  RETICCTPCT 2.1   Sepsis Labs: No results for input(s): PROCALCITON, LATICACIDVEN in the last 168 hours.  Recent Results (from the past 240 hour(s))  Culture, blood (routine x 2)     Status: None (Preliminary result)   Collection Time: 07/11/18  4:52 PM  Result Value Ref Range Status   Specimen Description   Final    BLOOD LEFT ARM Performed at [facility] [address], [place], [place] [address]    Special Requests   Final    BOTTLES DRAWN AEROBIC AND ANAEROBIC Blood Culture adequate volume   Culture   Final    NO GROWTH 3 DAYS Performed at [facility], [place] [address], [place], [place] [address]    Report Status PENDING  Incomplete  Culture, blood (routine x 2)     Status: None (Preliminary result)   Collection Time: 07/11/18  4:58 PM  Result Value Ref Range Status   Specimen Description   Final    BLOOD LEFT ARM Performed at [facility] [address], [place], [place] [address]    Special Requests   Final    BOTTLES DRAWN AEROBIC AND ANAEROBIC Blood Culture adequate volume   Culture   Final    NO GROWTH 3 DAYS Performed at [facility], [place] [address] [place], [place] [address]    Report Status PENDING  Incomplete  Aerobic/Anaerobic Culture (surgical/deep wound)     Status: None (Preliminary result)   Collection Time: 07/13/18 11:12 AM  Result Value Ref Range Status   Specimen  Description   Final    ABSCESS LT PSOAS Performed at [facility], [place] [address], [place], [place] [address]    Special Requests   Final    NONE Performed at [facility], [place] [address] [place], [place] [address]    Gram Stain NO WBC SEEN NO ORGANISMS SEEN   Final   Culture   Final    NO GROWTH 1 DAY Performed at [facility], [place] [address] [place], [place] [address]    Report Status PENDING  Incomplete     Radiology Studies: Ct Aspiration  Result Date: 07/13/2018 INDICATION: 68 year old with history of L1-L2 discitis. Recent MRI demonstrated progression of the discitis and osteomyelitis at L1-L2 with fluid extending into the paraspinal soft tissues including the left psoas muscle. Previous disc aspiration on 06/03/2018. Request for repeat aspiration and culture. EXAM: CT-GUIDED LEFT PSOAS MUSCLE ASPIRATION MEDICATIONS: No antibiotics given for the procedure. ANESTHESIA/SEDATION: Fentanyl 100 mcg IV; Versed 3.0 mg IV Moderate Sedation Time:  39 minutes The patient was continuously monitored during the procedure by the interventional radiology nurse under my direct supervision. COMPLICATIONS: None immediate. PROCEDURE: Informed written consent was obtained from the patient after a thorough discussion of the procedural risks, benefits and alternatives. All questions were addressed. A timeout was performed prior to the initiation of the procedure. CT images were obtained with the patient in prone position. The left psoas musculature at the level of L1-L2 was targeted. The overlying skin was prepped with chlorhexidine and a sterile field was created. Skin and soft tissues were anesthetized with 1% lidocaine. 18 gauge trocar needle was directed into the left psoas muscle multiple times. No purulent fluid could be aspirated. Few drops of bloody fluid was aspirated as the needle was withdrawn. Needle was repositioned multiple  times. FINDINGS: Inflammatory  changes at L1-L2 compatible with discitis. Needle was directed into the left psoas muscle at L1-L2. Discrete fluid collections are not clearly identified with CT. Needle position was directed based on the recent MRI findings. No purulent fluid was aspirated. Few drops of bloody fluid was removed as the needles were pulled back. IMPRESSION: CT-guided aspiration of the left psoas muscle. Few drops of bloody fluid was obtained from the left psoas musculature. No purulent fluid obtained. If the culture samples are negative, we could consider another dedicated disc aspiration. Electronically Signed   By: Markus Daft M.D.   On: 07/13/2018 12:37   Scheduled Meds:  ARIPiprazole  5 mg Oral Daily   clonazePAM  0.5 mg Oral QHS   fenofibrate  54 mg Oral Q supper   fentaNYL  1 patch Transdermal Q72H   FLUoxetine  60 mg Oral QHS   gabapentin  100 mg Oral 3 times per day   heparin injection (subcutaneous)  5,000 Units Subcutaneous Q8H   insulin aspart  0-5 Units Subcutaneous QHS   insulin aspart  0-9 Units Subcutaneous TID WC   insulin glargine  20 Units Subcutaneous BID   metoprolol tartrate  12.5 mg Oral TID   nystatin  5 mL Oral QID   pantoprazole  40 mg Oral Daily   polyethylene glycol  17 g Oral Daily   propylthiouracil  75 mg Oral Daily   rOPINIRole  3 mg Oral QHS   sodium chloride flush  3 mL Intravenous Q12H   Continuous Infusions:  sodium chloride     [START ON 07/15/2018] fluconazole (DIFLUCAN) IV     vancomycin 1,250 mg (07/14/18 1443)    LOS: 3 days    Kerney Elbe, DO [facility] PAGER is on AMION  If 7PM-7AM, please contact night-coverage [email or web address] Password [facility] 07/14/2018, 3:52 PM

## 2018-07-14 NOTE — Care Management Important Message (Signed)
Important Message  Patient Details  Name: MEIKA EARLL MRN: [id number] Date of Birth: [birth year]   Medicare Important Message Given:  Yes    [name] 07/14/2018, 11:15 AMImportant Message  Patient Details  Name: KHARIS LAPENNA MRN: [id number] Date of Birth: 01/18/1951   Medicare Important Message Given:  Yes    [name] 07/14/2018, 11:15 AM

## 2018-07-14 NOTE — Progress Notes (Signed)
Dr Alfredia Ferguson paged per patient request regarding "bumps on the roof of my mouth that hurt". Writer could visualize white raised lesions. Donne Hazel, RN

## 2018-07-15 LAB — CBC WITH DIFFERENTIAL/PLATELET
Abs Immature Granulocytes: 0.01 10*3/uL (ref 0.00–0.07)
Basophils Absolute: 0 10*3/uL (ref 0.0–0.1)
Basophils Relative: 0 %
Eosinophils Absolute: 0 10*3/uL (ref 0.0–0.5)
Eosinophils Relative: 0 %
HCT: 32.1 % — ABNORMAL LOW (ref 36.0–46.0)
Hemoglobin: 9.6 g/dL — ABNORMAL LOW (ref 12.0–15.0)
Immature Granulocytes: 0 %
Lymphocytes Relative: 20 %
Lymphs Abs: 0.7 10*3/uL (ref 0.7–4.0)
MCH: 23.6 pg — ABNORMAL LOW (ref 26.0–34.0)
MCHC: 29.9 g/dL — ABNORMAL LOW (ref 30.0–36.0)
MCV: 79.1 fL — ABNORMAL LOW (ref 80.0–100.0)
Monocytes Absolute: 0.4 10*3/uL (ref 0.1–1.0)
Monocytes Relative: 11 %
Neutro Abs: 2.3 10*3/uL (ref 1.7–7.7)
Neutrophils Relative %: 69 %
Platelets: 244 10*3/uL (ref 150–400)
RBC: 4.06 MIL/uL (ref 3.87–5.11)
RDW: 16.1 % — ABNORMAL HIGH (ref 11.5–15.5)
WBC: 3.4 10*3/uL — ABNORMAL LOW (ref 4.0–10.5)
nRBC: 0 % (ref 0.0–0.2)

## 2018-07-15 LAB — COMPREHENSIVE METABOLIC PANEL
ALT: 18 U/L (ref 0–44)
AST: 19 U/L (ref 15–41)
Albumin: 3.3 g/dL — ABNORMAL LOW (ref 3.5–5.0)
Alkaline Phosphatase: 106 U/L (ref 38–126)
Anion gap: 9 (ref 5–15)
BUN: 14 mg/dL (ref 8–23)
CO2: 26 mmol/L (ref 22–32)
Calcium: 9.6 mg/dL (ref 8.9–10.3)
Chloride: 102 mmol/L (ref 98–111)
Creatinine, Ser: 0.68 mg/dL (ref 0.44–1.00)
GFR calc Af Amer: >60 mL/min (ref >60)
GFR calc non Af Amer: >60 mL/min (ref >60)
Glucose, Bld: 122 mg/dL — ABNORMAL HIGH (ref 70–99)
Potassium: 3.6 mmol/L (ref 3.5–5.1)
Sodium: 137 mmol/L (ref 135–145)
Total Bilirubin: 0.5 mg/dL (ref 0.3–1.2)
Total Protein: 7.3 g/dL (ref 6.5–8.1)

## 2018-07-15 LAB — GLUCOSE, CAPILLARY
Glucose-Capillary: 120 mg/dL — ABNORMAL HIGH (ref 70–99)
Glucose-Capillary: 118 mg/dL — ABNORMAL HIGH (ref 70–99)
Glucose-Capillary: 195 mg/dL — ABNORMAL HIGH (ref 70–99)
Glucose-Capillary: 100 mg/dL — ABNORMAL HIGH (ref 70–99)

## 2018-07-15 LAB — MAGNESIUM: Magnesium: 1.8 mg/dL (ref 1.7–2.4)

## 2018-07-15 LAB — PHOSPHORUS: Phosphorus: 3.2 mg/dL (ref 2.5–4.6)

## 2018-07-15 MED ORDER — LIP MEDEX EX OINT
TOPICAL_OINTMENT | CUTANEOUS | Status: AC
  Administered 2018-07-15: 13:00:00
  Filled 2018-07-15: qty 7

## 2018-07-15 MED ORDER — ENSURE ENLIVE PO LIQD
237.0000 mL | Freq: Two times a day (BID) | ORAL | Status: DC
  Administered 2018-07-15 – 2018-07-23 (×15): 237 mL via ORAL
  Filled 2018-07-15 (×2): qty 237

## 2018-07-15 NOTE — Progress Notes (Signed)
PROGRESS NOTE    [name]  BTD:[id number] DOB: 16-Apr-1950 DOA: 07/11/2018 PCP: Crist Infante, MD  Brief Narrative:  HPI per Dr. Debbe Odea on 07/11/2018 [name] is a 68 y.o. female with medical history of DM 2 who presents with lower back pain. She is currently being treated for a lumbar discitis with Methicillin resistant coag neg staph with Vancomycin at home. She developed increasing back pain about 1 wk ago and noted a fever of about 100 degrees. She has had worsening of her pain with it radiating to her left buttock. She has no other complaints.   ED Course:  MRI lumbar spine> Progression of discitis and osteomyelitis at L1-2. Progressive fluid/pus in the disc space extending into the anterior paraspinous soft tissues compatible with progressive abscess. Progressive abscess in the left psoas muscle. Progressive myositis throughout the psoas muscles bilaterally and the erector spinae muscles bilaterally. Posterior extradural fluid collection on the left at L1-2 has progressed in the interval and may represent abscess  **Interim History Underwent CT aspiration 07/13/2018 and only a few drops of blood was aspirated from the Left Psoas Site.  ID consulted and because conservative management with IV antibiotics without source control had not effectively treated this issue she will need drainage for pus for source control so will need to re-discuss with Neurosurgery. Patient was started on IV Abx with IV Vancomycin per ID recommendations after attempted/unsuccessful drainage.   Assessment & Plan:   Principal Problem:   Abscess in epidural space of lumbar spine Active Problems:   DM (diabetes mellitus) (HCC)   Hypothyroidism   Obesity   Graves disease  Abscess in epidural space of lumbar spine associated with Discitis and Osteomyelitis of L1-L2; Previously MRSE Myositis in b/l psoas muscles; Progressive Abscess in Left Psoas Muscle -Contacted IR for drainage and they they  would like input from ID and Neurosurgery -ID Dr. Linus Salmons and Neurosurgery Dr. Ellene Route were consulted; Dr. Ellene Route of Neurosurgery advised that repeat needle biopsy of the elbow wound now to displace and aspirating some of the pus from the area.  Culture would be preferable to doing an open surgical decompression -Infectious diseases recommended drainage of the abscess and holding antibiotics pending drainage for culture and starting vancomycin after drainage -Underwent CT Aspiration but there was no significant drainage and there was only a few drops of blood aspirated from the Left Psoas site -Oral and IV medications ordered for pain control as below  -F/u blood cultures are NGTD at 3 Days -Aerobic/Anaerobic Cx sent and Gram Stain showed No WBC seen and No Organisms seen; CX showed NGTD at 3 Days (Blood Cx from 07/11/2018) -WBC went from 3.6 -> 3.0 -> 3.3-> 3.1 -> 3.4 -Discussed with Dr. Zada Finders of Neurosurgery on 07/14/2018 to weigh in now that Drainage was unsuccessful/limited and he feels that Neurosurgical Intervention is not currently warranted and recommending another course of prolonged Abx.   -Will need a prolonged Antibiotic Course due to Abscess and will continue IV Vancomycin for now; ID thinks she will not improve without drainage of the abscess and Neurosurgery does not feel like it needs to be drained.  -C/w IV Vancomycin for now;   Diabetes Mellitus Type 2 -C/w Lantus at lower than home dose at 20 units sq BID -Started Sensitive Novolog SS AC/HS  -CBGs ranging from 86-246  HTN -Changed Metoprolol Tartrate 50 mg po TID to 12.5 mg po TID and will discontinue Amlodipine 5 mg po qHS -Give 1 Liter Bolus and  re-evaluate -BP dropped to 75/53 yesterday Afternoon; Now improved and BP is 146/65 -? Related to Pain Medication; Currently not Symptomatic -Continue to Monitor Blood Pressure Carefully    Obesity Estimated body mass index is 37.79 kg/m as calculated from the following:    Height as of this encounter: 5\' 1"  (1.549 m).   Weight as of this encounter: 90.7 kg. -Weight Loss and Dietary Counseling given   Graves Disease -C/w Propylthiouracil 75 mg po Daily   Fibromyalgia/Depression/RLS -C/w Aripiprazole 5 mg po Daily, Fluoxetine 60 mg po qHS, Clonazepam 0.5 mg po qHS, and Ropinirole 3 mg po qHS  Chronic Pain Syndrome -C/w Gabapentin 100 mg po TID, Fentanyl 12 mcg/hr Patch q72h, and IV Hydromorphone 1 mg q3hprn Severe Pain and Oxycodone 10 mg po q4hprn Moderate Pain  -Bowel Regimen with Miralax 17 gram po Daily and   HLD -C/w Fenofibrate 54 m po qHS  GERD -C/w Pantoprazole 40 mg po Daily   Normocytic Anemia -Patient's Hb/Hct went from 9.8/32.1 -> 9.6/32.2 -> 9.4/32.3 -> 9.1/30.4 -> 9.6/32.1 -Continue to Monitor for S/Sx of Bleeding -PT was 16.1 and INR was 1.3 -Checked Anemia Panel and showed iron level of 27, U IBC of 247, TIBC of 274, saturations of 10%, ferritin level 28, folate level 15.7, and vitamin B12 of 2063 -Repeat CBC in AM   Hypokalemia -Patient's K+ is now 3.6 -Continue to Monitor and Replete as Necessary -Repeat CMP in AM   Oral Candidiasis/Thursh, improving  -Nurse reported white patches on her tongue and likely from Abx -Start Nystatin Suspension -Given Fluconazole IV with Pharmacy to dose -Continue to Monitor and patient feels like it is improving   Diarrhea -Patient's States that her diarrhea has been runny -Will stop Miralax 17 g po Daily for now -? If this is Abx induced with Vancomycin -Continue to Monitor closely and feel as if C Difficile is lower on the threshold given mild Leukopenia and because patient is Afebrile   DVT prophylaxis: SCDs; Will add Heparin 5,000 units sq q8h Code Status: FULL CODE Family Communication: No family present at bedside  Disposition Plan: Continue Further workup and Treatment with IV Vancomycin   Consultants:   Infectious Diseases  Neurosurgery  Interventional Radiology    Procedures: None   Antimicrobials:  Anti-infectives (From admission, onward)   Start     Dose/Rate Route Frequency Ordered Stop   07/15/18 1400  fluconazole (DIFLUCAN) IVPB 200 mg     200 mg 100 mL/hr over 60 Minutes Intravenous Every 24 hours 07/14/18 1322     07/14/18 1400  vancomycin (VANCOCIN) 1,000 mg in sodium chloride 0.9 % 250 mL IVPB  Status:  Discontinued     1,000 mg 250 mL/hr over 60 Minutes Intravenous Every 24 hours 07/13/18 1304 07/14/18 0752   07/14/18 1400  vancomycin (VANCOCIN) 1,250 mg in sodium chloride 0.9 % 250 mL IVPB     1,250 mg 166.7 mL/hr over 90 Minutes Intravenous Every 24 hours 07/14/18 0752     07/14/18 1400  fluconazole (DIFLUCAN) IVPB 200 mg     200 mg 100 mL/hr over 60 Minutes Intravenous  Once 07/14/18 1303 07/14/18 1549   07/13/18 1345  vancomycin (VANCOCIN) 2,000 mg in sodium chloride 0.9 % 500 mL IVPB     2,000 mg 250 mL/hr over 120 Minutes Intravenous  Once 07/13/18 1301 07/13/18 1615     Subjective: Seen and examined at bedside and stated her Pain was not controlled.  Feels like her mouth is improved and is  able to swallow little bit better.  No nausea or vomiting but states her stools are a little "runny".  I discussed with her about stopping the MiraLAX and she is agreeable.  No chest pain or other concerns or complaints at this time.  Objective: Vitals:   07/14/18 0822 07/14/18 1301 07/14/18 2031 07/15/18 0551  BP: (!) 157/66 136/86 (!) 155/67 (!) 146/65  Pulse: 98 91 95 100  Resp: 19 18 15 18   Temp:  98.2 F (36.8 C) 98.2 F (36.8 C) 98.4 F (36.9 C)  TempSrc:  Oral Oral Oral  SpO2: 98% 99% 96% 98%  Weight:      Height:        Intake/Output Summary (Last 24 hours) at 07/15/2018 1117 Last data filed at 07/15/2018 0900 Gross per 24 hour  Intake 860.32 ml  Output 976 ml  Net -115.68 ml   Filed Weights   07/11/18 1218  Weight: 90.7 kg   Examination: Physical Exam:  Constitutional: Well-nourished, well-developed obese  Caucasian female currently no acute distress and is resting but still complaining of diffuse pain that is uncontrolled. Eyes: Lids and conjunctive are normal.  Sclera anicteric ENMT: External ears and nose appear normal.  Grossly normal hearing Neck: Appears supple no JVD Respiratory: Diminished auscultation bilaterally no appreciable wheezing, rales, rhonchi.  Patient not tachypneic or using accessory muscle breathe Cardiovascular: Regular rate and rhythm.  No appreciable murmurs, rubs, gallops. Abdomen:, Nontender, distended secondary body habitus.  Bowel sounds present GU: Deferred Musculoskeletal: No contractures or cyanosis.  No joint deformities in upper extremities Skin: Skin is warm and dry no appreciable rashes or lesions on limited skin evaluation Neurologic: Cranial nerves II through XII grossly intact no appreciable focal deficits.  Romberg sign cerebellar reflexes were not assessed Psychiatric: Judgment and insight.  Patient is awake, alert and oriented x3.  Slightly depressed appearing  Data Reviewed: I have personally reviewed following labs and imaging studies  CBC: Recent Labs  Lab 07/11/18 1257 07/12/18 0920 07/13/18 0414 07/14/18 0326 07/15/18 0412  WBC 3.6* 3.0* 3.3* 3.1* 3.4*  NEUTROABS 2.4 2.1 2.1 2.1 2.3  HGB 9.8* 9.6* 9.4* 9.1* 9.6*  HCT 32.1* 32.2* 32.3* 30.4* 32.1*  MCV 79.9* 80.9 81.4 80.0 79.1*  PLT 251 230 230 207 831   Basic Metabolic Panel: Recent Labs  Lab 07/11/18 1257 07/12/18 0920 07/13/18 0414 07/14/18 0326 07/15/18 0412  NA 136 136 138 137 137  K 4.2 3.4* 4.3 3.6 3.6  CL 94* 97* 101 102 102  CO2 29 29 29 25 26   GLUCOSE 148* 180* 117* 117* 122*  BUN 14 12 11 13 14   CREATININE 0.93 0.89 0.77 0.69 0.68  CALCIUM 9.7 9.4 9.8 9.6 9.6  MG  --  1.7 1.7 1.8 1.8  PHOS  --  3.3 2.8 3.4 3.2   GFR: Estimated Creatinine Clearance: 71 mL/min (by C-G formula based on SCr of 0.68 mg/dL). Liver Function Tests: Recent Labs  Lab 07/11/18 1257  07/12/18 0920 07/13/18 0414 07/14/18 0326 07/15/18 0412  AST 36 21 19 18 19   ALT 18 17 16 17 18   ALKPHOS 104 93 97 101 106  BILITOT 1.0 0.4 0.6 0.7 0.5  PROT 8.1 7.2 7.4 7.3 7.3  ALBUMIN 3.6 3.3* 3.4* 3.3* 3.3*   No results for input(s): LIPASE, AMYLASE in the last 168 hours. No results for input(s): AMMONIA in the last 168 hours. Coagulation Profile: Recent Labs  Lab 07/13/18 0414  INR 1.3*   Cardiac Enzymes: No results  for input(s): CKTOTAL, CKMB, CKMBINDEX, TROPONINI in the last 168 hours. BNP (last 3 results) No results for input(s): PROBNP in the last 8760 hours. HbA1C: No results for input(s): HGBA1C in the last 72 hours. CBG: Recent Labs  Lab 07/14/18 1136 07/14/18 1630 07/14/18 2118 07/14/18 2208 07/15/18 0739  GLUCAP 97 86 152* 246* 120*   Lipid Profile: No results for input(s): CHOL, HDL, LDLCALC, TRIG, CHOLHDL, LDLDIRECT in the last 72 hours. Thyroid Function Tests: No results for input(s): TSH, T4TOTAL, FREET4, T3FREE, THYROIDAB in the last 72 hours. Anemia Panel: Recent Labs    07/14/18 0326  VITAMINB12 2,063*  FOLATE 15.7  FERRITIN 28  TIBC 274  IRON 27*  RETICCTPCT 2.1   Sepsis Labs: No results for input(s): PROCALCITON, LATICACIDVEN in the last 168 hours.  Recent Results (from the past 240 hour(s))  Culture, blood (routine x 2)     Status: None (Preliminary result)   Collection Time: 07/11/18  4:52 PM  Result Value Ref Range Status   Specimen Description   Final    BLOOD LEFT ARM Performed at [facility] [address] [place], [place] [address]    Special Requests   Final    BOTTLES DRAWN AEROBIC AND ANAEROBIC Blood Culture adequate volume   Culture   Final    NO GROWTH 3 DAYS Performed at [facility], [place] [address], [place], [place], [place] [address]    Report Status PENDING  Incomplete  Culture, blood (routine x 2)     Status: None (Preliminary result)   Collection Time: 07/11/18  4:58 PM  Result Value Ref  Range Status   Specimen Description   Final    BLOOD LEFT ARM Performed at [facility] [address] [place], [place] [address]    Special Requests   Final    BOTTLES DRAWN AEROBIC AND ANAEROBIC Blood Culture adequate volume   Culture   Final    NO GROWTH 3 DAYS Performed at [facility], [place] [address] [place], [place] [address]    Report Status PENDING  Incomplete  Aerobic/Anaerobic Culture (surgical/deep wound)     Status: None (Preliminary result)   Collection Time: 07/13/18 11:12 AM  Result Value Ref Range Status   Specimen Description   Final    ABSCESS LT PSOAS Performed at [facility], [place] [address], [place], [place] [address]    Special Requests   Final    NONE Performed at [facility], [place] [address], [place], [place] [address]    Gram Stain NO WBC SEEN NO ORGANISMS SEEN   Final   Culture   Final    NO GROWTH 2 DAYS NO ANAEROBES ISOLATED; CULTURE IN PROGRESS FOR 5 DAYS Performed at [facility] [address] [place], [place] [address]    Report Status PENDING  Incomplete     Radiology Studies: Ct Aspiration  Result Date: 07/13/2018 INDICATION: 68 year old with history of L1-L2 discitis. Recent MRI demonstrated progression of the discitis and osteomyelitis at L1-L2 with fluid extending into the paraspinal soft tissues including the left psoas muscle. Previous disc aspiration on 06/03/2018. Request for repeat aspiration and culture. EXAM: CT-GUIDED LEFT PSOAS MUSCLE ASPIRATION MEDICATIONS: No antibiotics given for the procedure. ANESTHESIA/SEDATION: Fentanyl 100 mcg IV; Versed 3.0 mg IV Moderate Sedation Time:  39 minutes The patient was continuously monitored during the procedure by the interventional radiology nurse under my direct supervision. COMPLICATIONS: None immediate. PROCEDURE: Informed written  consent was obtained from the patient after a thorough discussion of the procedural risks,  benefits and alternatives. All questions were addressed. A timeout was performed prior to the initiation of the procedure. CT images were obtained with the patient in prone position. The left psoas musculature at the level of L1-L2 was targeted. The overlying skin was prepped with chlorhexidine and a sterile field was created. Skin and soft tissues were anesthetized with 1% lidocaine. 18 gauge trocar needle was directed into the left psoas muscle multiple times. No purulent fluid could be aspirated. Few drops of bloody fluid was aspirated as the needle was withdrawn. Needle was repositioned multiple times. FINDINGS: Inflammatory changes at L1-L2 compatible with discitis. Needle was directed into the left psoas muscle at L1-L2. Discrete fluid collections are not clearly identified with CT. Needle position was directed based on the recent MRI findings. No purulent fluid was aspirated. Few drops of bloody fluid was removed as the needles were pulled back. IMPRESSION: CT-guided aspiration of the left psoas muscle. Few drops of bloody fluid was obtained from the left psoas musculature. No purulent fluid obtained. If the culture samples are negative, we could consider another dedicated disc aspiration. Electronically Signed   By: Markus Daft M.D.   On: 07/13/2018 12:37   Scheduled Meds: . ARIPiprazole  5 mg Oral Daily  . clonazePAM  0.5 mg Oral QHS  . feeding supplement (ENSURE ENLIVE)  237 mL Oral BID BM  . fenofibrate  54 mg Oral Q supper  . fentaNYL  1 patch Transdermal Q72H  . FLUoxetine  60 mg Oral QHS  . gabapentin  100 mg Oral 3 times per day  . heparin injection (subcutaneous)  5,000 Units Subcutaneous Q8H  . insulin aspart  0-5 Units Subcutaneous QHS  . insulin aspart  0-9 Units Subcutaneous TID WC  . insulin glargine  20 Units Subcutaneous BID  . metoprolol tartrate  12.5 mg Oral TID  . nystatin  5 mL Oral QID  . pantoprazole  40 mg Oral Daily  . polyethylene glycol  17 g Oral Daily  .  propylthiouracil  75 mg Oral Daily  . rOPINIRole  3 mg Oral QHS  . sodium chloride flush  3 mL Intravenous Q12H   Continuous Infusions: . sodium chloride    . fluconazole (DIFLUCAN) IV    . vancomycin 1,250 mg (07/14/18 1443)    LOS: 4 days    Kerney Elbe, DO [facility] PAGER is on [place]  If 7PM-7AM, please contact night-coverage [email or web address] Password [facility] 07/15/2018, 11:17 AM

## 2018-07-15 NOTE — Progress Notes (Signed)
Entered room to place patient on CPAP, RN states that she would like to give patient her medications first.  Will stand by.

## 2018-07-15 NOTE — Progress Notes (Signed)
Pt. Is now being cleaned up and washed, unable to place CPAP at this time.  Now 2300 and patient has been cleaned up but wants to eat.  Patient has requested drink and crackers.  Will now have to wait for digestion of food prior to CPAP placement.

## 2018-07-16 LAB — CBC WITH DIFFERENTIAL/PLATELET
Abs Immature Granulocytes: 0 10*3/uL (ref 0.00–0.07)
Basophils Absolute: 0 10*3/uL (ref 0.0–0.1)
Basophils Relative: 0 %
Eosinophils Absolute: 0 10*3/uL (ref 0.0–0.5)
Eosinophils Relative: 0 %
HCT: 31.5 % — ABNORMAL LOW (ref 36.0–46.0)
Hemoglobin: 9.4 g/dL — ABNORMAL LOW (ref 12.0–15.0)
Immature Granulocytes: 0 %
Lymphocytes Relative: 29 %
Lymphs Abs: 0.8 10*3/uL (ref 0.7–4.0)
MCH: 23.9 pg — ABNORMAL LOW (ref 26.0–34.0)
MCHC: 29.8 g/dL — ABNORMAL LOW (ref 30.0–36.0)
MCV: 79.9 fL — ABNORMAL LOW (ref 80.0–100.0)
Monocytes Absolute: 0.3 10*3/uL (ref 0.1–1.0)
Monocytes Relative: 12 %
Neutro Abs: 1.6 10*3/uL — ABNORMAL LOW (ref 1.7–7.7)
Neutrophils Relative %: 59 %
Platelets: 247 10*3/uL (ref 150–400)
RBC: 3.94 MIL/uL (ref 3.87–5.11)
RDW: 15.9 % — ABNORMAL HIGH (ref 11.5–15.5)
WBC: 2.8 10*3/uL — ABNORMAL LOW (ref 4.0–10.5)
nRBC: 0 % (ref 0.0–0.2)

## 2018-07-16 LAB — COMPREHENSIVE METABOLIC PANEL
ALT: 21 U/L (ref 0–44)
AST: 27 U/L (ref 15–41)
Albumin: 3.3 g/dL — ABNORMAL LOW (ref 3.5–5.0)
Alkaline Phosphatase: 100 U/L (ref 38–126)
Anion gap: 8 (ref 5–15)
BUN: 12 mg/dL (ref 8–23)
CO2: 26 mmol/L (ref 22–32)
Calcium: 9.2 mg/dL (ref 8.9–10.3)
Chloride: 100 mmol/L (ref 98–111)
Creatinine, Ser: 0.73 mg/dL (ref 0.44–1.00)
GFR calc Af Amer: >60 mL/min (ref >60)
GFR calc non Af Amer: >60 mL/min (ref >60)
Glucose, Bld: 111 mg/dL — ABNORMAL HIGH (ref 70–99)
Potassium: 3.7 mmol/L (ref 3.5–5.1)
Sodium: 134 mmol/L — ABNORMAL LOW (ref 135–145)
Total Bilirubin: 0.4 mg/dL (ref 0.3–1.2)
Total Protein: 7.1 g/dL (ref 6.5–8.1)

## 2018-07-16 LAB — CULTURE, BLOOD (ROUTINE X 2)
Culture: NO GROWTH
Culture: NO GROWTH
Special Requests: ADEQUATE
Special Requests: ADEQUATE

## 2018-07-16 LAB — GLUCOSE, CAPILLARY
Glucose-Capillary: 124 mg/dL — ABNORMAL HIGH (ref 70–99)
Glucose-Capillary: 132 mg/dL — ABNORMAL HIGH (ref 70–99)
Glucose-Capillary: 123 mg/dL — ABNORMAL HIGH (ref 70–99)
Glucose-Capillary: 91 mg/dL (ref 70–99)
Glucose-Capillary: 134 mg/dL — ABNORMAL HIGH (ref 70–99)

## 2018-07-16 LAB — PHOSPHORUS: Phosphorus: 2.8 mg/dL (ref 2.5–4.6)

## 2018-07-16 LAB — MAGNESIUM: Magnesium: 1.7 mg/dL (ref 1.7–2.4)

## 2018-07-16 MED ORDER — FLUCONAZOLE 100 MG PO TABS
200.0000 mg | ORAL_TABLET | ORAL | Status: DC
  Administered 2018-07-17 – 2018-07-20 (×4): 200 mg via ORAL
  Filled 2018-07-16 (×4): qty 2

## 2018-07-16 NOTE — Progress Notes (Signed)
PROGRESS NOTE    [name]  BHA:[id number] DOB: June 06, 1950 DOA: 07/11/2018 PCP: Crist Infante, MD  Brief Narrative:  HPI per Dr. Debbe Odea on 07/11/2018 [name] is a 68 y.o. female with medical history of DM 2 who presents with lower back pain. She is currently being treated for a lumbar discitis with Methicillin resistant coag neg staph with Vancomycin at home. She developed increasing back pain about 1 wk ago and noted a fever of about 100 degrees. She has had worsening of her pain with it radiating to her left buttock. She has no other complaints.   ED Course:  MRI lumbar spine> Progression of discitis and osteomyelitis at L1-2. Progressive fluid/pus in the disc space extending into the anterior paraspinous soft tissues compatible with progressive abscess. Progressive abscess in the left psoas muscle. Progressive myositis throughout the psoas muscles bilaterally and the erector spinae muscles bilaterally. Posterior extradural fluid collection on the left at L1-2 has progressed in the interval and may represent abscess  **Interim History Underwent CT Aspiration 07/13/2018 and only a few drops of blood was aspirated from the Left Psoas Site. ID consulted and because conservative management with IV antibiotics without source control had not effectively treated this issue she will need drainage for pus for source control so will need to re-discuss with Neurosurgery. Patient was started on IV Abx with IV Vancomycin per ID recommendations after attempted/unsuccessful drainage and will continue. Palliative Care will be consulted for Symptom management   Assessment & Plan:   Principal Problem:   Abscess in epidural space of lumbar spine Active Problems:   DM (diabetes mellitus) (HCC)   Hypothyroidism   Obesity   Graves disease  Abscess in epidural space of lumbar spine associated with Discitis and Osteomyelitis of L1-L2; Previously MRSE Myositis in b/l psoas muscles;  Progressive Abscess in Left Psoas Muscle -Contacted IR for drainage and they they would like input from ID and Neurosurgery -ID Dr. Linus Salmons and Neurosurgery Dr. Ellene Route were consulted; Dr. Ellene Route of Neurosurgery advised that repeat needle biopsy of the elbow wound now to displace and aspirating some of the pus from the area.  Culture would be preferable to doing an open surgical decompression -Infectious diseases recommended drainage of the abscess and holding antibiotics pending drainage for culture and starting vancomycin after drainage -Underwent CT Aspiration but there was no significant drainage and there was only a few drops of blood aspirated from the Left Psoas site -Oral and IV medications ordered for pain control as below  -F/u blood cultures are NGTD at 3 Days -Aerobic/Anaerobic Cx sent and Gram Stain showed No WBC seen and No Organisms seen; CX showed NGTD at 5 Days -WBC went from 3.6 -> 3.0 -> 3.3-> 3.1; Repeat this AM pending  -Discussed with Dr. Zada Finders of Neurosurgery to weigh in now that Drainage was unsuccessful/limited and he feels that Neurosurgical Intervention is not currently warranted and recommending another course of prolonged Abx.   -Will need a prolonged Antibiotic Course due to Abscess and will continue IV Vancomycin for now; ID thinks she will not improve without drainage of the abscess and Neurosurgery does not feel like it needs to be drained.  -Still has uncontrolled Pain so will get Palliative Care Consult for Symptom Management   Diabetes Mellitus Type 2 -C/w Lantus at lower than home dose at 20 units sq BID -Started Sensitive Novolog SS AC/HS  -CBGs ranging from 100-195; Blood Sugar on CMP this AM was 111  HTN -Changed Metoprolol  Tartrate 50 mg po TID to 12.5 mg po TID and will discontinue Amlodipine 5 mg po qHS -BP is now 157/64 -Continue to Monitor Blood Pressure Carefully    Obesity Estimated body mass index is 37.79 kg/m as calculated from the  following:   Height as of this encounter: 5\' 1"  (1.549 m).   Weight as of this encounter: 90.7 kg. -Weight Loss and Dietary Counseling given   Graves Disease -C/w Propylthiouracil 75 mg po Daily   Fibromyalgia/Depression/RLS -C/w Aripiprazole 5 mg po Daily, Fluoxetine 60 mg po qHS, Clonazepam 0.5 mg po qHS, and Ropinirole 3 mg po qHS  Chronic Pain Syndrome -C/w Gabapentin 100 mg po TID, Fentanyl 12 mcg/hr Patch q72h, and IV Hydromorphone 1 mg q3hprn Severe Pain and Oxycodone 10 mg po q4hprn Moderate Pain  -Will have Palliative Care to Evaluate for Symptom Management due to her uncontrolled Pain -Bowel Regimen with Miralax 17 gram po Daily and   HLD -C/w Fenofibrate 54 m po qHS  GERD -C/w Pantoprazole 40 mg po Daily   Normocytic Anemia -Patient's Hb/Hct went from 9.8/32.1 -> 9.6/32.2 -> 9.4/32.3 -> 9.1/30.4 and repeat is Pending  -Continue to Monitor for S/Sx of Bleeding -PT was 16.1 and INR was 1.3 -Checked Anemia Panel and showed iron level of 27, U IBC of 247, TIBC of 274, saturations of 10%, ferritin level 28, folate level 15.7, and vitamin B12 of 2063 -Repeat CBC in AM   Hypokalemia -Patient's K+ is now 3.7 -Continue to Monitor and Replete as Necessary -Repeat CMP in AM   Oral Candidiasis/Thursh -Nurse reported white patches on her tongue and likely from Abx -Start Nystatin Suspension -Given Fluconazole IV with Pharmacy to dose and likely can change to po today  -Continue to Monitor   Hyponatremia -Patient's Na+ is 134 this AM -Dropped from 137 -Continue to Monitor and Trend -Repeat CMP in AM  DVT prophylaxis: SCDs; Will add Heparin 5,000 units sq q8h Code Status: FULL CODE Family Communication: No family present at bedside  Disposition Plan: Continue Further workup and Clearance by ID  Consultants:   Infectious Diseases  Neurosurgery  Interventional Radiology   Procedures: None   Antimicrobials:  Anti-infectives (From admission, onward)   Start      Dose/Rate Route Frequency Ordered Stop   07/15/18 1400  fluconazole (DIFLUCAN) IVPB 200 mg     200 mg 100 mL/hr over 60 Minutes Intravenous Every 24 hours 07/14/18 1322     07/14/18 1400  vancomycin (VANCOCIN) 1,000 mg in sodium chloride 0.9 % 250 mL IVPB  Status:  Discontinued     1,000 mg 250 mL/hr over 60 Minutes Intravenous Every 24 hours 07/13/18 1304 07/14/18 0752   07/14/18 1400  vancomycin (VANCOCIN) 1,250 mg in sodium chloride 0.9 % 250 mL IVPB     1,250 mg 166.7 mL/hr over 90 Minutes Intravenous Every 24 hours 07/14/18 0752     07/14/18 1400  fluconazole (DIFLUCAN) IVPB 200 mg     200 mg 100 mL/hr over 60 Minutes Intravenous  Once 07/14/18 1303 07/14/18 1549   07/13/18 1345  vancomycin (VANCOCIN) 2,000 mg in sodium chloride 0.9 % 500 mL IVPB     2,000 mg 250 mL/hr over 120 Minutes Intravenous  Once 07/13/18 1301 07/13/18 1615     Subjective: Seen and examined at bedside and she was still complaining of uncontrolled pain.  Denied any nausea or vomiting.  States that she feels a little bit better wants to stay on top of her pain.  States her mouth is much improved.  No other concerns or complaints at this time.  Objective: Vitals:   07/15/18 2219 07/16/18 0551 07/16/18 1116 07/16/18 1117  BP: 140/64 135/63 (!) 157/64 (!) 157/64  Pulse: 98 (!) 103 (!) 101 (!) 102  Resp: 16 16    Temp: 98.2 F (36.8 C) 98.4 F (36.9 C)    TempSrc: Oral Oral    SpO2: 97% 95%  97%  Weight:      Height:        Intake/Output Summary (Last 24 hours) at 07/16/2018 1327 Last data filed at 07/16/2018 1100 Gross per 24 hour  Intake 90.67 ml  Output 1350 ml  Net -1259.33 ml   Filed Weights   07/11/18 1218  Weight: 90.7 kg   Examination: Physical Exam:  Constitutional: Well-nourished, well-developed obese Caucasian female currently no acute distress appears calm but still appears somewhat uncomfortable and complaining of back pain and pain in her legs Eyes: Lids and conjunctive are  normal.  Sclera anicteric ENMT: External ears and nose appear normal.  Grossly normal hearing Neck: Appears supple no JVD Respiratory: Diminished auscultation bilaterally no appreciable wheezing, rales, rhonchi.  Patient not tachypneic or using accessory muscles to breathe Cardiovascular: Slightly tachycardic but has a regular rhythm.  Has trace lower extremity edema Abdomen: Soft, mildly tender to palpate, distended secondary body habitus.  Bowel sounds present x4 GU: Deferred Musculoskeletal: No contractures or cyanosis.  No joint fomites in the upper lower extremities Skin: Skin is warm and dry no appreciable rashes or lesions on to skin evaluation Neurologic: Cranial nerves II through XII grossly intact no appreciable focal deficits.  Romberg sign cerebellar reflexes were not Psychiatric: Normal judgment and insight.  Patient is awake, alert, oriented x3.  Has a pleasant mood and affect  Data Reviewed: I have personally reviewed following labs and imaging studies  CBC: Recent Labs  Lab 07/11/18 1257 07/12/18 0920 07/13/18 0414 07/14/18 0326 07/15/18 0412  WBC 3.6* 3.0* 3.3* 3.1* 3.4*  NEUTROABS 2.4 2.1 2.1 2.1 2.3  HGB 9.8* 9.6* 9.4* 9.1* 9.6*  HCT 32.1* 32.2* 32.3* 30.4* 32.1*  MCV 79.9* 80.9 81.4 80.0 79.1*  PLT 251 230 230 207 270   Basic Metabolic Panel: Recent Labs  Lab 07/12/18 0920 07/13/18 0414 07/14/18 0326 07/15/18 0412 07/16/18 1038  NA 136 138 137 137 134*  K 3.4* 4.3 3.6 3.6 3.7  CL 97* 101 102 102 100  CO2 29 29 25 26 26   GLUCOSE 180* 117* 117* 122* 111*  BUN 12 11 13 14 12   CREATININE 0.89 0.77 0.69 0.68 0.73  CALCIUM 9.4 9.8 9.6 9.6 9.2  MG 1.7 1.7 1.8 1.8 1.7  PHOS 3.3 2.8 3.4 3.2 2.8   GFR: Estimated Creatinine Clearance: 71 mL/min (by C-G formula based on SCr of 0.73 mg/dL). Liver Function Tests: Recent Labs  Lab 07/12/18 0920 07/13/18 0414 07/14/18 0326 07/15/18 0412 07/16/18 1038  AST 21 19 18 19 27   ALT 17 16 17 18 21   ALKPHOS 93  97 101 106 100  BILITOT 0.4 0.6 0.7 0.5 0.4  PROT 7.2 7.4 7.3 7.3 7.1  ALBUMIN 3.3* 3.4* 3.3* 3.3* 3.3*   No results for input(s): LIPASE, AMYLASE in the last 168 hours. No results for input(s): AMMONIA in the last 168 hours. Coagulation Profile: Recent Labs  Lab 07/13/18 0414  INR 1.3*   Cardiac Enzymes: No results for input(s): CKTOTAL, CKMB, CKMBINDEX, TROPONINI in the last 168 hours. BNP (last 3 results) No  results for input(s): PROBNP in the last 8760 hours. HbA1C: No results for input(s): HGBA1C in the last 72 hours. CBG: Recent Labs  Lab 07/15/18 1706 07/15/18 2216 07/16/18 0545 07/16/18 0734 07/16/18 1224  GLUCAP 195* 100* 124* 132* 123*   Lipid Profile: No results for input(s): CHOL, HDL, LDLCALC, TRIG, CHOLHDL, LDLDIRECT in the last 72 hours. Thyroid Function Tests: No results for input(s): TSH, T4TOTAL, FREET4, T3FREE, THYROIDAB in the last 72 hours. Anemia Panel: Recent Labs    07/14/18 0326  VITAMINB12 2,063*  FOLATE 15.7  FERRITIN 28  TIBC 274  IRON 27*  RETICCTPCT 2.1   Sepsis Labs: No results for input(s): PROCALCITON, LATICACIDVEN in the last 168 hours.  Recent Results (from the past 240 hour(s))  Culture, blood (routine x 2)     Status: None   Collection Time: 07/11/18  4:52 PM  Result Value Ref Range Status   Specimen Description   Final    BLOOD LEFT ARM Performed at [facility] [address] [place], [place] [address]    Special Requests   Final    BOTTLES DRAWN AEROBIC AND ANAEROBIC Blood Culture adequate volume   Culture   Final    NO GROWTH 5 DAYS Performed at [facility], [place] [address] [place], [place] [address]    Report Status 07/16/2018 FINAL  Final  Culture, blood (routine x 2)     Status: None   Collection Time: 07/11/18  4:58 PM  Result Value Ref Range Status   Specimen Description   Final    BLOOD LEFT ARM Performed at [facility] [address] [place], [place]  [address]    Special Requests   Final    BOTTLES DRAWN AEROBIC AND ANAEROBIC Blood Culture adequate volume   Culture   Final    NO GROWTH 5 DAYS Performed at [facility], [place] [address] [place], [place] [address]    Report Status 07/16/2018 FINAL  Final  Aerobic/Anaerobic Culture (surgical/deep wound)     Status: None (Preliminary result)   Collection Time: 07/13/18 11:12 AM  Result Value Ref Range Status   Specimen Description   Final    ABSCESS LT PSOAS Performed at [facility], [place] [address], [place], [place] [address]    Special Requests   Final    NONE Performed at [facility], [place] [address], [place], [place] [address]    Gram Stain NO WBC SEEN NO ORGANISMS SEEN   Final   Culture   Final    NO GROWTH 3 DAYS NO ANAEROBES ISOLATED; CULTURE IN PROGRESS FOR 5 DAYS Performed at [facility] [address] [place], [place] [address]    Report Status PENDING  Incomplete     Radiology Studies: No results found. Scheduled Meds: . ARIPiprazole  5 mg Oral Daily  . clonazePAM  0.5 mg Oral QHS  . feeding supplement (ENSURE ENLIVE)  237 mL Oral BID BM  . fenofibrate  54 mg Oral Q supper  . fentaNYL  1 patch Transdermal Q72H  . FLUoxetine  60 mg Oral QHS  . gabapentin  100 mg Oral 3 times per day  . heparin injection (subcutaneous)  5,000 Units Subcutaneous Q8H  . insulin aspart  0-5 Units Subcutaneous QHS  . insulin aspart  0-9 Units Subcutaneous TID WC  . insulin glargine  20 Units Subcutaneous BID  . metoprolol tartrate  12.5 mg Oral TID  .  nystatin  5 mL Oral QID  . pantoprazole  40 mg Oral Daily  . propylthiouracil  75 mg Oral Daily  . rOPINIRole  3 mg Oral QHS  . sodium chloride flush  3 mL Intravenous Q12H   Continuous Infusions: . sodium chloride    . fluconazole (DIFLUCAN) IV 200 mg (07/16/18 1305)  . vancomycin 166.7 mL/hr at 07/15/18 1500    LOS: 5 days    Kerney Elbe, DO [facility] PAGER  is on [place]  If 7PM-7AM, please contact night-coverage [email or web address] Password [email or web address] 07/16/2018, 1:27 PM

## 2018-07-17 DIAGNOSIS — R52 Pain, unspecified: Secondary | ICD-10-CM

## 2018-07-17 DIAGNOSIS — Z515 Encounter for palliative care: Secondary | ICD-10-CM

## 2018-07-17 DIAGNOSIS — R531 Weakness: Secondary | ICD-10-CM

## 2018-07-17 LAB — CBC WITH DIFFERENTIAL/PLATELET
Abs Immature Granulocytes: 0.01 10*3/uL (ref 0.00–0.07)
Basophils Absolute: 0 10*3/uL (ref 0.0–0.1)
Basophils Relative: 0 %
Eosinophils Absolute: 0 10*3/uL (ref 0.0–0.5)
Eosinophils Relative: 0 %
HCT: 30.3 % — ABNORMAL LOW (ref 36.0–46.0)
Hemoglobin: 9.1 g/dL — ABNORMAL LOW (ref 12.0–15.0)
Immature Granulocytes: 0 %
Lymphocytes Relative: 24 %
Lymphs Abs: 0.8 10*3/uL (ref 0.7–4.0)
MCH: 23.9 pg — ABNORMAL LOW (ref 26.0–34.0)
MCHC: 30 g/dL (ref 30.0–36.0)
MCV: 79.5 fL — ABNORMAL LOW (ref 80.0–100.0)
Monocytes Absolute: 0.4 10*3/uL (ref 0.1–1.0)
Monocytes Relative: 13 %
Neutro Abs: 2 10*3/uL (ref 1.7–7.7)
Neutrophils Relative %: 63 %
Platelets: 240 10*3/uL (ref 150–400)
RBC: 3.81 MIL/uL — ABNORMAL LOW (ref 3.87–5.11)
RDW: 16 % — ABNORMAL HIGH (ref 11.5–15.5)
WBC: 3.2 10*3/uL — ABNORMAL LOW (ref 4.0–10.5)
nRBC: 0 % (ref 0.0–0.2)

## 2018-07-17 LAB — GLUCOSE, CAPILLARY
Glucose-Capillary: 119 mg/dL — ABNORMAL HIGH (ref 70–99)
Glucose-Capillary: 122 mg/dL — ABNORMAL HIGH (ref 70–99)
Glucose-Capillary: 84 mg/dL (ref 70–99)
Glucose-Capillary: 127 mg/dL — ABNORMAL HIGH (ref 70–99)

## 2018-07-17 LAB — COMPREHENSIVE METABOLIC PANEL
ALT: 21 U/L (ref 0–44)
AST: 26 U/L (ref 15–41)
Albumin: 3.2 g/dL — ABNORMAL LOW (ref 3.5–5.0)
Alkaline Phosphatase: 99 U/L (ref 38–126)
Anion gap: 9 (ref 5–15)
BUN: 11 mg/dL (ref 8–23)
CO2: 26 mmol/L (ref 22–32)
Calcium: 9.5 mg/dL (ref 8.9–10.3)
Chloride: 101 mmol/L (ref 98–111)
Creatinine, Ser: 0.73 mg/dL (ref 0.44–1.00)
GFR calc Af Amer: >60 mL/min (ref >60)
GFR calc non Af Amer: >60 mL/min (ref >60)
Glucose, Bld: 108 mg/dL — ABNORMAL HIGH (ref 70–99)
Potassium: 3.7 mmol/L (ref 3.5–5.1)
Sodium: 136 mmol/L (ref 135–145)
Total Bilirubin: 0.5 mg/dL (ref 0.3–1.2)
Total Protein: 6.9 g/dL (ref 6.5–8.1)

## 2018-07-17 LAB — MAGNESIUM: Magnesium: 1.7 mg/dL (ref 1.7–2.4)

## 2018-07-17 LAB — VANCOMYCIN, TROUGH: Vancomycin Tr: 10 ug/mL — ABNORMAL LOW (ref 15–20)

## 2018-07-17 LAB — PHOSPHORUS: Phosphorus: 3.2 mg/dL (ref 2.5–4.6)

## 2018-07-17 MED ORDER — FENTANYL 50 MCG/HR TD PT72
1.0000 | MEDICATED_PATCH | TRANSDERMAL | Status: DC
  Administered 2018-07-17 – 2018-07-29 (×5): 1 via TRANSDERMAL
  Filled 2018-07-17 (×5): qty 1

## 2018-07-17 MED ORDER — VANCOMYCIN HCL 10 G IV SOLR
1750.0000 mg | INTRAVENOUS | Status: DC
  Administered 2018-07-17 – 2018-07-21 (×5): 1750 mg via INTRAVENOUS
  Filled 2018-07-17 (×5): qty 1750

## 2018-07-17 MED ORDER — HYDROMORPHONE HCL 1 MG/ML IJ SOLN
1.0000 mg | INTRAMUSCULAR | Status: DC | PRN
  Administered 2018-07-18 – 2018-07-29 (×14): 1 mg via INTRAVENOUS
  Filled 2018-07-17 (×14): qty 1

## 2018-07-17 MED ORDER — SENNA 8.6 MG PO TABS
2.0000 | ORAL_TABLET | Freq: Every day | ORAL | Status: DC
  Administered 2018-07-18 – 2018-07-29 (×10): 17.2 mg via ORAL
  Filled 2018-07-17 (×11): qty 2

## 2018-07-17 MED ORDER — OXYCODONE HCL 5 MG PO TABS
10.0000 mg | ORAL_TABLET | ORAL | Status: DC | PRN
  Administered 2018-07-18 – 2018-07-26 (×33): 10 mg via ORAL
  Filled 2018-07-17 (×34): qty 2

## 2018-07-17 NOTE — Care Management Important Message (Signed)
Important Message  Patient Details IM Letter given to Joelene Millin SW to present to the Patient Name: [name] MRN: [id number] Date of Birth: Feb 01, 1951   Medicare Important Message Given:  Yes    [name] 07/17/2018, 11:28 AM

## 2018-07-17 NOTE — Plan of Care (Signed)

## 2018-07-17 NOTE — Progress Notes (Addendum)
Pharmacy Antibiotic Note  [name] is a 68 y.o. female with lumbar discitis caused by methicillin resistant CoNS in March 2020 and was on vancomycin PTA. Vancomycin course stated 06/03/18 with a planned 6 week duration, expected to end on 07/16/18. Most recent regimen was vancomycin 1000 mg IV q24h. Last dose of vancomycin PTA was 07/10/18, antibiotics were held on admission and resumed on 4/23.  With limited drainage from CT aspiration, neurosurgery recom another prolonged course of abx for infection.  Today, 07/17/2018: - afeb, wbc low - scr stable   Plan: - continue vancomycin 1250 mg IV q24h. Will check vancomycin trough level prior to today's does to assess current regimen. - fluconazole 200 mg daily for oropharyngeal candidiasis. Pharmacy will sign off for fluconazole - monitor renal function, f/u cultures, LOT  Adden (4/27 at 1540): vancomycin trough level now back subtherapeutic at 10 (goal 15-20). Will increase dose to 1750 mg IV 24h ____________________________________________  Height: 5\' 1"  (154.9 cm) Weight: 200 lb (90.7 kg) IBW/kg (Calculated) : 47.8  Temp (24hrs), Avg:98.4 F (36.9 C), Min:97.8 F (36.6 C), Max:99.1 F (37.3 C)  Recent Labs  Lab 07/13/18 0414 07/14/18 0326 07/15/18 0412 07/16/18 1038 07/16/18 1437 07/17/18 0339  WBC 3.3* 3.1* 3.4*  --  2.8* 3.2*  CREATININE 0.77 0.69 0.68 0.73  --  0.73    Estimated Creatinine Clearance: 71 mL/min (by C-G formula based on SCr of 0.73 mg/dL).    Allergies  Allergen Reactions  . Imitrex [Sumatriptan] Other (See Comments)    Vascular spasms  . Nsaids Other (See Comments)    PT UNABLE TO TOLERATE NSAID'S DRUGS DUE TO HX OF GASTRIC SLEEVE SURGERY  . Codeine Nausea Only  . Promethazine Hcl Other (See Comments)    Restless leg feeling all over body  . Rosuvastatin Calcium Other (See Comments)    Leg/muscle pain  . Statins Other (See Comments)    Leg/muscle pain  . Sulfonamide Derivatives Hives    Childhood  allergy   . Vicodin [Hydrocodone-Acetaminophen] Nausea And Vomiting    Antimicrobials this admission: Prior to admission: Vancomycin 06/03/18 > 07/10/18 (planned stop 4/26) --> but now plan to cont with another prolonged course of abx  Vancomycin 4/23 >> Fluconazole 4/24 ( oropharyngeal candidiasis) >>   Dose adjustment: -Fluconazole to PO on 4/27  Microbiology results: 4/21 BCx: neg FINAL 4/23 abscess, L psoas:   Previous recent cultures: 3/17 Urine: >100K pseudomonas, proteus 3/14 aspiration of L1,L2 disc space: abundant staphylococcus epidermidis (oxacillin resistant)  Thank you for allowing pharmacy to be a part of this patient's care.  Lynelle Doctor 07/17/2018 10:25 AM
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
## 2018-07-17 NOTE — Progress Notes (Signed)
PROGRESS NOTE    [name]  KPT:[id number] DOB: [birth year] DOA: 07/11/2018 PCP: Crist Infante, MD  Brief Narrative:  HPI per Dr. Debbe Odea on 07/11/2018 [name] is a 68 y.o. female with medical history of DM 2 who presents with lower back pain. She is currently being treated for a lumbar discitis with Methicillin resistant coag neg staph with Vancomycin at home. She developed increasing back pain about 1 wk ago and noted a fever of about 100 degrees. She has had worsening of her pain with it radiating to her left buttock. She has no other complaints.   ED Course:  MRI lumbar spine> Progression of discitis and osteomyelitis at L1-2. Progressive fluid/pus in the disc space extending into the anterior paraspinous soft tissues compatible with progressive abscess. Progressive abscess in the left psoas muscle. Progressive myositis throughout the psoas muscles bilaterally and the erector spinae muscles bilaterally. Posterior extradural fluid collection on the left at L1-2 has progressed in the interval and may represent abscess  **Interim History Underwent CT Aspiration 07/13/2018 and only a few drops of blood was aspirated from the Left Psoas Site. ID consulted and because conservative management with IV antibiotics without source control had not effectively treated this issue she will need drainage for pus for source control so will need to re-discuss with Neurosurgery. Patient was started on IV Abx with IV Vancomycin per ID recommendations after attempted/unsuccessful drainage and will continue. Palliative Care will be consulted for Symptom management. PT/OT to evaluate and treat and will remove bedrest privileges.   Assessment & Plan:   Principal Problem:   Abscess in epidural space of lumbar spine Active Problems:   DM (diabetes mellitus) (HCC)   Hypothyroidism   Obesity   Graves disease  Abscess in epidural space of lumbar spine associated with Discitis and Osteomyelitis  of L1-L2; Previously MRSE Myositis in b/l psoas muscles; Progressive Abscess in Left Psoas Muscle -Contacted IR for drainage and they they would like input from ID and Neurosurgery -ID Dr. Linus Salmons and Neurosurgery Dr. Ellene Route were consulted; Dr. Ellene Route of Neurosurgery advised that repeat needle biopsy of the elbow wound now to displace and aspirating some of the pus from the area.  Culture would be preferable to doing an open surgical decompression -Infectious diseases recommended drainage of the abscess and holding antibiotics pending drainage for culture and starting vancomycin after drainage -Underwent CT Aspiration but there was no significant drainage and there was only a few drops of blood aspirated from the Left Psoas site -Oral and IV medications ordered for pain control as below  -F/u blood cultures are NGTD at 3 Days -Aerobic/Anaerobic Cx sent and Gram Stain showed No WBC seen and No Organisms seen; CX showed NGTD at 5 Days -WBC went from 3.6 -> 3.0 -> 3.3-> 3.1 -> 2.8 -> 3.2 -Discussed with Dr. Zada Finders of Neurosurgery to weigh in now that Drainage was unsuccessful/limited and he feels that Neurosurgical Intervention is not currently warranted and recommending another course of prolonged Abx.   -Will need a prolonged Antibiotic Course due to Abscess and will continue IV Vancomycin for now; ID thinks she will not improve without drainage of the abscess and Neurosurgery does not feel like it needs to be drained.  -Still has uncontrolled Pain so will get Palliative Care Consult for Symptom Management  -PT/OT to evaluate and Treat as she appears very deconditioned   Diabetes Mellitus Type 2 -C/w Lantus at lower than home dose at 20 units sq BID -Started Sensitive  Novolog SS AC/HS  -CBGs ranging from 91-134; Blood Sugar on CMP this AM was 108  HTN -Changed Metoprolol Tartrate 50 mg po TID to 12.5 mg po TID and will discontinue Amlodipine 5 mg po qHS -BP is now 138/75 -Continue to Monitor  Blood Pressure Carefully    Obesity Estimated body mass index is 37.79 kg/m as calculated from the following:   Height as of this encounter: 5\' 1"  (1.549 m).   Weight as of this encounter: 90.7 kg. -Weight Loss and Dietary Counseling given   Graves Disease -C/w Propylthiouracil 75 mg po Daily   Fibromyalgia/Depression/RLS -C/w Aripiprazole 5 mg po Daily, Fluoxetine 60 mg po qHS, Clonazepam 0.5 mg po qHS, and Ropinirole 3 mg po qHS  Chronic Pain Syndrome -C/w Gabapentin 100 mg po TID, Fentanyl 12 mcg/hr Patch q72h, and IV Hydromorphone 1 mg q3hprn changed to q4hprn for Severe Pain and Oxycodone 10 mg po q4hprn Moderate Pain  -Will have Palliative Care to Evaluate for Symptom Management due to her uncontrolled Pain -Bowel Regimen with Miralax 17 gram po Daily and Bisacodyly 10 mg RC Rectal Pain Dailyprn  HLD -C/w Fenofibrate 54 m po qHS  GERD -C/w Pantoprazole 40 mg po Daily   Normocytic Anemia -Patient's Hb/Hct went from 9.8/32.1 -> 9.6/32.2 -> 9.4/32.3 -> 9.1/30.4 ->  9.4/31.5 -> 9.1/30.3 -Continue to Monitor for S/Sx of Bleeding -PT was 16.1 and INR was 1.3 -Checked Anemia Panel and showed iron level of 27, U IBC of 247, TIBC of 274, saturations of 10%, ferritin level 28, folate level 15.7, and vitamin B12 of 2063 -Repeat CBC in AM   Hypokalemia -Patient's K+ is now 3.7 -Continue to Monitor and Replete as Necessary -Repeat CMP in AM   Oral Candidiasis/Thursh, improving  -Nurse reported white patches on her tongue and likely from Abx -Start Nystatin Suspension -Given Fluconazole IV with Pharmacy to dose and likely can changed to po and will continue Fluconazole 200 mg po Daily  -Continue to Monitor   Hyponatremia -Patient's Na+ is 136 this AM -Continue to Monitor and Trend -Repeat CMP in AM  DVT prophylaxis: SCDs; Will add Heparin 5,000 units sq q8h Code Status: FULL CODE Family Communication: No family present at bedside  Disposition Plan: Continue Further  workup and Clearance by ID and evaluation buy PT/OT  Consultants:   Infectious Diseases  Neurosurgery  Interventional Radiology   Procedures: None   Antimicrobials:  Anti-infectives (From admission, onward)   Start     Dose/Rate Route Frequency Ordered Stop   07/17/18 1300  fluconazole (DIFLUCAN) tablet 200 mg     200 mg Oral Every 24 hours 07/16/18 1338     07/15/18 1400  fluconazole (DIFLUCAN) IVPB 200 mg  Status:  Discontinued     200 mg 100 mL/hr over 60 Minutes Intravenous Every 24 hours 07/14/18 1322 07/16/18 1338   07/14/18 1400  vancomycin (VANCOCIN) 1,000 mg in sodium chloride 0.9 % 250 mL IVPB  Status:  Discontinued     1,000 mg 250 mL/hr over 60 Minutes Intravenous Every 24 hours 07/13/18 1304 07/14/18 0752   07/14/18 1400  vancomycin (VANCOCIN) 1,250 mg in sodium chloride 0.9 % 250 mL IVPB     1,250 mg 166.7 mL/hr over 90 Minutes Intravenous Every 24 hours 07/14/18 0752     07/14/18 1400  fluconazole (DIFLUCAN) IVPB 200 mg     200 mg 100 mL/hr over 60 Minutes Intravenous  Once 07/14/18 1303 07/14/18 1549   07/13/18 1345  vancomycin (VANCOCIN) 2,000 mg  in sodium chloride 0.9 % 500 mL IVPB     2,000 mg 250 mL/hr over 120 Minutes Intravenous  Once 07/13/18 1301 07/13/18 1615     Subjective: Seen and examined at bedside and she is feeling weak and worried that she will go home without getting proper physical therapy.  No lightheadedness or dizziness but complaining of being "sore".  No other concerns or complaints at this time and states that her mouth is improved significantly but still feels somewhat dry.  Objective: Vitals:   07/16/18 1117 07/16/18 1458 07/16/18 2125 07/17/18 0529  BP: (!) 157/64 131/71 (!) 138/54 138/75  Pulse: (!) 102 98 92 (!) 103  Resp:  16 16 16   Temp:  97.8 F (36.6 C) 99.1 F (37.3 C) 98.2 F (36.8 C)  TempSrc:  Oral Oral Oral  SpO2: 97% 95% 94% 95%  Weight:      Height:        Intake/Output Summary (Last 24 hours) at 07/17/2018  1303 Last data filed at 07/17/2018 1000 Gross per 24 hour  Intake 681.92 ml  Output 600 ml  Net 81.92 ml   Filed Weights   07/11/18 1218  Weight: 90.7 kg   Examination: Physical Exam:  Constitutional: Well-nourished, well-developed obese Caucasian female currently no acute distress appears a little drowsy but awake and and she feels little fatigued and mildly uncomfortable Eyes: Lids and conjunctive are normal.  Sclera anicteric ENMT: External ears and nose appear normal.  Grossly normal hearing Neck: Appears supple no JVD Respiratory: Diminished auscultation bilaterally no appreciable wheezing, rales, rhonchi.  She has unlabored breathing Cardiovascular: Slightly tachycardic but has a regular rhythm.  Has mild lower extremity edema Abdomen: Soft, nontender to palpate.  Distended second body habitus.  Bowel sounds present GU: Deferred Musculoskeletal: No contractures or cyanosis.  No joint deformities in the upper or lower extremities; has a PICC line Skin: Skin is warm and dry no appreciable rashes or lesions on limited skin evaluation Neurologic: Cranial nerves II through XII grossly intact no appreciable focal deficits.  Romberg sign cerebellar reflexes were not assessed Psychiatric: Normal judgment and insight.  She is awake, alert.  Has a slightly depressed and flat affect  Data Reviewed: I have personally reviewed following labs and imaging studies  CBC: Recent Labs  Lab 07/13/18 0414 07/14/18 0326 07/15/18 0412 07/16/18 1437 07/17/18 0339  WBC 3.3* 3.1* 3.4* 2.8* 3.2*  NEUTROABS 2.1 2.1 2.3 1.6* 2.0  HGB 9.4* 9.1* 9.6* 9.4* 9.1*  HCT 32.3* 30.4* 32.1* 31.5* 30.3*  MCV 81.4 80.0 79.1* 79.9* 79.5*  PLT 230 207 244 247 500   Basic Metabolic Panel: Recent Labs  Lab 07/13/18 0414 07/14/18 0326 07/15/18 0412 07/16/18 1038 07/17/18 0339  NA 138 137 137 134* 136  K 4.3 3.6 3.6 3.7 3.7  CL 101 102 102 100 101  CO2 29 25 26 26 26   GLUCOSE 117* 117* 122* 111* 108*   BUN 11 13 14 12 11   CREATININE 0.77 0.69 0.68 0.73 0.73  CALCIUM 9.8 9.6 9.6 9.2 9.5  MG 1.7 1.8 1.8 1.7 1.7  PHOS 2.8 3.4 3.2 2.8 3.2   GFR: Estimated Creatinine Clearance: 71 mL/min (by C-G formula based on SCr of 0.73 mg/dL). Liver Function Tests: Recent Labs  Lab 07/13/18 0414 07/14/18 0326 07/15/18 0412 07/16/18 1038 07/17/18 0339  AST 19 18 19 27 26   ALT 16 17 18 21 21   ALKPHOS 97 101 106 100 99  BILITOT 0.6 0.7 0.5 0.4 0.5  PROT 7.4 7.3 7.3 7.1 6.9  ALBUMIN 3.4* 3.3* 3.3* 3.3* 3.2*   No results for input(s): LIPASE, AMYLASE in the last 168 hours. No results for input(s): AMMONIA in the last 168 hours. Coagulation Profile: Recent Labs  Lab 07/13/18 0414  INR 1.3*   Cardiac Enzymes: No results for input(s): CKTOTAL, CKMB, CKMBINDEX, TROPONINI in the last 168 hours. BNP (last 3 results) No results for input(s): PROBNP in the last 8760 hours. HbA1C: No results for input(s): HGBA1C in the last 72 hours. CBG: Recent Labs  Lab 07/16/18 1224 07/16/18 1627 07/16/18 2123 07/17/18 0758 07/17/18 1209  GLUCAP 123* 91 134* 119* 122*   Lipid Profile: No results for input(s): CHOL, HDL, LDLCALC, TRIG, CHOLHDL, LDLDIRECT in the last 72 hours. Thyroid Function Tests: No results for input(s): TSH, T4TOTAL, FREET4, T3FREE, THYROIDAB in the last 72 hours. Anemia Panel: No results for input(s): VITAMINB12, FOLATE, FERRITIN, TIBC, IRON, RETICCTPCT in the last 72 hours. Sepsis Labs: No results for input(s): PROCALCITON, LATICACIDVEN in the last 168 hours.  Recent Results (from the past 240 hour(s))  Culture, blood (routine x 2)     Status: None   Collection Time: 07/11/18  4:52 PM  Result Value Ref Range Status   Specimen Description   Final    BLOOD LEFT ARM Performed at [facility] [address] [place], [place] [address]    Special Requests   Final    BOTTLES DRAWN AEROBIC AND ANAEROBIC Blood Culture adequate volume   Culture   Final    NO  GROWTH 5 DAYS Performed at [facility], [place] [address], [place], [place] [address]    Report Status 07/16/2018 FINAL  Final  Culture, blood (routine x 2)     Status: None   Collection Time: 07/11/18  4:58 PM  Result Value Ref Range Status   Specimen Description   Final    BLOOD LEFT ARM Performed at [facility] [address], [place], [place] [address]    Special Requests   Final    BOTTLES DRAWN AEROBIC AND ANAEROBIC Blood Culture adequate volume   Culture   Final    NO GROWTH 5 DAYS Performed at [facility], [place] [address], [place], [place] [address]    Report Status 07/16/2018 FINAL  Final  Aerobic/Anaerobic Culture (surgical/deep wound)     Status: None (Preliminary result)   Collection Time: 07/13/18 11:12 AM  Result Value Ref Range Status   Specimen Description   Final    ABSCESS LT PSOAS Performed at [facility], [place] [address], [place], [place] [address]    Special Requests   Final    NONE Performed at [facility], [place] [address] [place], [place] [address]    Gram Stain NO WBC SEEN NO ORGANISMS SEEN   Final   Culture   Final    NO GROWTH 4 DAYS NO ANAEROBES ISOLATED; CULTURE IN PROGRESS FOR 5 DAYS Performed at [facility] [address],  [place], [place] [address]    Report Status PENDING  Incomplete     Radiology Studies: No results found. Scheduled Meds: . ARIPiprazole  5 mg Oral Daily  . clonazePAM  0.5 mg Oral QHS  . feeding supplement (ENSURE ENLIVE)  237 mL Oral BID BM  . fenofibrate  54 mg Oral Q supper  . fentaNYL  1 patch Transdermal Q72H  . fluconazole  200 mg Oral Q24H  . FLUoxetine  60 mg Oral QHS  . gabapentin  100 mg Oral 3 times per day  . heparin injection (subcutaneous)  5,000 Units Subcutaneous Q8H  . insulin aspart  0-5 Units Subcutaneous QHS  . insulin aspart  0-9 Units Subcutaneous TID WC  . insulin glargine  20 Units Subcutaneous BID  . metoprolol  tartrate  12.5 mg Oral TID  . nystatin  5 mL Oral QID  . pantoprazole  40 mg Oral Daily  . propylthiouracil  75 mg Oral Daily  . rOPINIRole  3 mg Oral QHS  . sodium chloride flush  3 mL Intravenous Q12H   Continuous Infusions: . sodium chloride    . vancomycin Stopped (07/16/18 1630)    LOS: 6 days    Kerney Elbe, DO [facility] PAGER is on [place]  If 7PM-7AM, please contact night-coverage [email or web address] Password [email or web address] 07/17/2018, 1:03 PM

## 2018-07-17 NOTE — Progress Notes (Signed)
[name] for Infectious Disease   Reason for visit: Follow up on discitis and abscess  Interval History: afebrile, WBC stable;. No changes. Continues to complain of pain.  Asking for pain medications.  Neurosurgery does not feel surgical intervention indicated.  Has not been able to walk around. Concerned about discharge.   Physical Exam: Constitutional:  Vitals:   07/16/18 2125 07/17/18 0529  BP: (!) 138/54 138/75  Pulse: 92 (!) 103  Resp: 16 16  Temp: 99.1 F (37.3 C) 98.2 F (36.8 C)  SpO2: 94% 95%   patient appears in NAD Respiratory: Normal respiratory effort; CTA B Cardiovascular: RRR Skin: no rashes   Review of Systems: Constitutional: negative for fevers and chills Gastrointestinal: negative for nausea and diarrhea  Lab Results  Component Value Date   WBC 3.2 (L) 07/17/2018   HGB 9.1 (L) 07/17/2018   HCT 30.3 (L) 07/17/2018   MCV 79.5 (L) 07/17/2018   PLT 240 07/17/2018    Lab Results  Component Value Date   CREATININE 0.73 07/17/2018   BUN 11 07/17/2018   NA 136 07/17/2018   K 3.7 07/17/2018   CL 101 07/17/2018   CO2 26 07/17/2018    Lab Results  Component Value Date   ALT 21 07/17/2018   AST 26 07/17/2018   ALKPHOS 99 07/17/2018     Microbiology: Recent Results (from the past 240 hour(s))  Culture, blood (routine x 2)     Status: None   Collection Time: 07/11/18  4:52 PM  Result Value Ref Range Status   Specimen Description   Final    BLOOD LEFT ARM Performed at [facility], [place] [address] [place], [place] [address]    Special Requests   Final    BOTTLES DRAWN AEROBIC AND ANAEROBIC Blood Culture adequate volume   Culture   Final    NO GROWTH 5 DAYS Performed at [facility], [place] [address], [place], [place] [address]    Report Status 07/16/2018 FINAL  Final  Culture, blood (routine x 2)     Status: None   Collection Time: 07/11/18  4:58 PM  Result Value Ref Range Status   Specimen Description    Final    BLOOD LEFT ARM Performed at [place] [address], [place], [place] [address]    Special Requests   Final    BOTTLES DRAWN AEROBIC AND ANAEROBIC Blood Culture adequate volume   Culture   Final    NO GROWTH 5 DAYS Performed at [facility], [place] [address] [place], [place] [address]    Report Status 07/16/2018 FINAL  Final  Aerobic/Anaerobic Culture (surgical/deep wound)     Status: None (Preliminary result)   Collection Time: 07/13/18 11:12 AM  Result Value Ref Range Status   Specimen Description   Final    ABSCESS LT PSOAS Performed at [facility], [place] [address], [place], [place] [address]    Special Requests   Final    NONE Performed at [facility], [place] [address], [place], [place] [address]    Gram Stain NO WBC SEEN NO ORGANISMS SEEN   Final   Culture   Final    NO GROWTH 4 DAYS NO ANAEROBES ISOLATED; CULTURE IN PROGRESS FOR 5 DAYS Performed at [facility] [address], [place], [place] [address]    Report Status PENDING  Incomplete    Impression/Plan:  1. Discitits/osteomyelitis - MRSE on  cultures previously. Back on vancomycin.  Will need to prolong course due to abscess. No new culture growth from aspiration.   2. Psoas and epidural abscess - Neurosurgery does not feel surgical intervention indicated.  Will continue with IV vancomycin as above.  Will extend duration - original date was April 26th, would extend 4 more weeks through May 23rd. Will consider reimaging after 4 weeks  3.  Weakness - she feels she is unable to walk.  ? If rehab would be of benefit.    Will arrange follow up in our clinic in about 3 weeks.   I will sign off, thanks for consultation

## 2018-07-18 DIAGNOSIS — R52 Pain, unspecified: Secondary | ICD-10-CM

## 2018-07-18 DIAGNOSIS — Z515 Encounter for palliative care: Secondary | ICD-10-CM

## 2018-07-18 LAB — COMPREHENSIVE METABOLIC PANEL
ALT: 25 U/L (ref 0–44)
AST: 35 U/L (ref 15–41)
Albumin: 3.4 g/dL — ABNORMAL LOW (ref 3.5–5.0)
Alkaline Phosphatase: 105 U/L (ref 38–126)
Anion gap: 11 (ref 5–15)
BUN: 9 mg/dL (ref 8–23)
CO2: 25 mmol/L (ref 22–32)
Calcium: 9.8 mg/dL (ref 8.9–10.3)
Chloride: 100 mmol/L (ref 98–111)
Creatinine, Ser: 0.72 mg/dL (ref 0.44–1.00)
GFR calc Af Amer: >60 mL/min (ref >60)
GFR calc non Af Amer: >60 mL/min (ref >60)
Glucose, Bld: 146 mg/dL — ABNORMAL HIGH (ref 70–99)
Potassium: 3.6 mmol/L (ref 3.5–5.1)
Sodium: 136 mmol/L (ref 135–145)
Total Bilirubin: 0.8 mg/dL (ref 0.3–1.2)
Total Protein: 7.5 g/dL (ref 6.5–8.1)

## 2018-07-18 LAB — CBC WITH DIFFERENTIAL/PLATELET
Abs Immature Granulocytes: 0.01 10*3/uL (ref 0.00–0.07)
Basophils Absolute: 0 10*3/uL (ref 0.0–0.1)
Basophils Relative: 0 %
Eosinophils Absolute: 0 10*3/uL (ref 0.0–0.5)
Eosinophils Relative: 0 %
HCT: 33.2 % — ABNORMAL LOW (ref 36.0–46.0)
Hemoglobin: 9.9 g/dL — ABNORMAL LOW (ref 12.0–15.0)
Immature Granulocytes: 0 %
Lymphocytes Relative: 24 %
Lymphs Abs: 0.6 10*3/uL — ABNORMAL LOW (ref 0.7–4.0)
MCH: 23.9 pg — ABNORMAL LOW (ref 26.0–34.0)
MCHC: 29.8 g/dL — ABNORMAL LOW (ref 30.0–36.0)
MCV: 80.2 fL (ref 80.0–100.0)
Monocytes Absolute: 0.3 10*3/uL (ref 0.1–1.0)
Monocytes Relative: 13 %
Neutro Abs: 1.5 10*3/uL — ABNORMAL LOW (ref 1.7–7.7)
Neutrophils Relative %: 63 %
Platelets: 236 10*3/uL (ref 150–400)
RBC: 4.14 MIL/uL (ref 3.87–5.11)
RDW: 16.1 % — ABNORMAL HIGH (ref 11.5–15.5)
WBC: 2.4 10*3/uL — ABNORMAL LOW (ref 4.0–10.5)
nRBC: 0 % (ref 0.0–0.2)

## 2018-07-18 LAB — GLUCOSE, CAPILLARY
Glucose-Capillary: 120 mg/dL — ABNORMAL HIGH (ref 70–99)
Glucose-Capillary: 178 mg/dL — ABNORMAL HIGH (ref 70–99)
Glucose-Capillary: 158 mg/dL — ABNORMAL HIGH (ref 70–99)
Glucose-Capillary: 102 mg/dL — ABNORMAL HIGH (ref 70–99)

## 2018-07-18 LAB — AEROBIC/ANAEROBIC CULTURE W GRAM STAIN (SURGICAL/DEEP WOUND): Gram Stain: NONE SEEN

## 2018-07-18 LAB — AEROBIC/ANAEROBIC CULTURE (SURGICAL/DEEP WOUND): Culture: NO GROWTH

## 2018-07-18 LAB — MAGNESIUM: Magnesium: 1.9 mg/dL (ref 1.7–2.4)

## 2018-07-18 LAB — PHOSPHORUS: Phosphorus: 3.7 mg/dL (ref 2.5–4.6)

## 2018-07-18 NOTE — Evaluation (Signed)
Physical Therapy Evaluation Patient Details Name: [name] MRN: [id number] DOB: 1951/02/18 Today's Date: 07/18/2018   History of Present Illness  [name] is a 68 y.o. female with history of diabetes mellitus type 2, sleep apnea, hypertension, Graves' disease, chronic and disease, anemia. Received epidural injection 05/23/2018. She is currently being treated for a lumbar discitis.  Pt admitted with continued back pain and found to have progression of abscess in epidural space of lumbar spine associated with Discitis and Osteomyelitis of L1-L2, and Progressive Abscess in Left Psoas Muscle  Clinical Impression  Pt admitted with above diagnosis. Pt currently with functional limitations due to the deficits listed below (see PT Problem List).  Pt will benefit from skilled PT to increase their independence and safety with mobility to allow discharge to the venue listed below.  Pt reports medicated by PO pain meds earlier however unable to tolerate any bed mobility.  Pt unable to achieve sitting position due to increased pain.  Pt's pain seems to be more limiting then weakness at this time however pt not able to mobilize.  Pt declines SNF upon d/c and wants to d/c home.    Follow Up Recommendations SNF;Supervision/Assistance - 24 hour    Equipment Recommendations  None recommended by PT    Recommendations for Other Services       Precautions / Restrictions Precautions Precautions: Fall;Back      Mobility  Bed Mobility Overal bed mobility: Needs Assistance Bed Mobility: Rolling Rolling: Total assist         General bed mobility comments: attempted multiple times and ways to assist pt upright however pt unable to complete even half of mobility due to increased pain in back and L groin  Transfers                    Ambulation/Gait                Stairs            Wheelchair Mobility    Modified Rankin (Stroke Patients Only)       Balance                                             Pertinent Vitals/Pain Pain Assessment: 0-10 Pain Score: 10-Worst pain ever Pain Location: lower back, L groin Pain Descriptors / Indicators: Radiating;Sharp Pain Intervention(s): Monitored during session;Limited activity within patient's tolerance;Premedicated before session;Repositioned(PO medicated prior to session)    Home Living Family/patient expects to be discharged to:: Private residence Living Arrangements: Spouse/significant other;Children Available Help at Discharge: Family;Available PRN/intermittently Type of Home: House Home Access: Stairs to enter Entrance Stairs-Rails: Psychiatric nurse of Steps: 3 (can fit walker on each step) Home Layout: One level Home Equipment: Walker - 2 wheels;Wheelchair - manual;Hospital bed Additional Comments: pt lives with son and husband who work. 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, has RW    Prior Function Level of Independence: Independent with assistive device(s)         Comments: has been using RW since back pain started this year     Hand Dominance        Extremity/Trunk Assessment        Lower Extremity Assessment Lower Extremity Assessment: Generalized weakness       Communication   Communication: No difficulties  Cognition Arousal/Alertness: Awake/alert Behavior During  Therapy: WFL for tasks assessed/performed Overall Cognitive Status: Within Functional Limits for tasks assessed                                        General Comments      Exercises     Assessment/Plan    PT Assessment Patient needs continued PT services  PT Problem List Decreased strength;Decreased mobility;Decreased activity tolerance;Decreased knowledge of use of DME;Decreased balance;Pain       PT Treatment Interventions DME instruction;Gait training;Functional mobility training;Therapeutic activities;Balance training;Stair  training;Therapeutic exercise;Patient/family education;Neuromuscular re-education;Wheelchair mobility training    PT Goals (Current goals can be found in the Care Plan section)  Acute Rehab PT Goals PT Goal Formulation: With patient Time For Goal Achievement: 08/01/18 Potential to Achieve Goals: Fair    Frequency Min 3X/week   Barriers to discharge        Co-evaluation               AM-PAC PT "6 Clicks" Mobility  Outcome Measure Help needed turning from your back to your side while in a flat bed without using bedrails?: Total Help needed moving from lying on your back to sitting on the side of a flat bed without using bedrails?: Total Help needed moving to and from a bed to a chair (including a wheelchair)?: Total Help needed standing up from a chair using your arms (e.g., wheelchair or bedside chair)?: Total Help needed to walk in hospital room?: Total Help needed climbing 3-5 steps with a railing? : Total 6 Click Score: 6    End of Session   Activity Tolerance: Patient limited by pain Patient left: with call bell/phone within reach;with bed alarm set;in bed Nurse Communication: Mobility status;Patient requests pain meds PT Visit Diagnosis: Other abnormalities of gait and mobility (R26.89)    Time: 3612-2449 PT Time Calculation (min) (ACUTE ONLY): 19 min   Charges:   PT Evaluation $PT Eval Low Complexity: [place], PT, DPT Acute Rehabilitation Services Office: [phone] Pager: [phone]  Trena Platt 07/18/2018, 12:35 PM
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
## 2018-07-18 NOTE — Progress Notes (Signed)
PROGRESS NOTE    [name]  YQM:[id number] DOB: 1950-09-02 DOA: 07/11/2018 PCP: Crist Infante, MD  Brief Narrative:  HPI per Dr. Debbe Odea on 07/11/2018 [name] is a 68 y.o. female with medical history of DM 2 who presents with lower back pain. She is currently being treated for a lumbar discitis with Methicillin resistant coag neg staph with Vancomycin at home. She developed increasing back pain about 1 wk ago and noted a fever of about 100 degrees. She has had worsening of her pain with it radiating to her left buttock. She has no other complaints.   ED Course:  MRI lumbar spine> Progression of discitis and osteomyelitis at L1-2. Progressive fluid/pus in the disc space extending into the anterior paraspinous soft tissues compatible with progressive abscess. Progressive abscess in the left psoas muscle. Progressive myositis throughout the psoas muscles bilaterally and the erector spinae muscles bilaterally. Posterior extradural fluid collection on the left at L1-2 has progressed in the interval and may represent abscess  **Interim History Underwent CT Aspiration 07/13/2018 and only a few drops of blood was aspirated from the Left Psoas Site. ID consulted and because conservative management with IV antibiotics without source control had not effectively treated this issue she will need drainage for pus for source control so will need to re-discuss with Neurosurgery. Patient was started on IV Abx with IV Vancomycin per ID recommendations after attempted/unsuccessful drainage and will continue for a prolonged course and Dr. Linus Salmons recommending extending course 4 more weeks to May 23. Palliative Care will be consulted for Symptom management. PT/OT to evaluate and treat and will remove bedrest privileges. PT/OT recommending SNF but currently declining and unable to move due to pain limitations.   Assessment & Plan:   Principal Problem:   Abscess in epidural space of lumbar  spine Active Problems:   DM (diabetes mellitus) (HCC)   Hypothyroidism   Obesity   Graves disease   Pain   Palliative care by specialist  Abscess in epidural space of lumbar spine associated with Discitis and Osteomyelitis of L1-L2; Previously MRSE Myositis in b/l psoas muscles; Progressive Abscess in Left Psoas Muscle -Contacted IR for drainage and they they would like input from ID and Neurosurgery -ID Dr. Linus Salmons and Neurosurgery Dr. Ellene Route were consulted; Dr. Ellene Route of Neurosurgery advised that repeat needle biopsy of the elbow wound now to displace and aspirating some of the pus from the area.  Culture would be preferable to doing an open surgical decompression -Infectious diseases recommended drainage of the abscess and holding antibiotics pending drainage for culture and starting vancomycin after drainage -Underwent CT Aspiration but there was no significant drainage and there was only a few drops of blood aspirated from the Left Psoas site -Oral and IV medications ordered for pain control as below  -F/u blood cultures are NGTD at 5 Days -Aerobic/Anaerobic Cx sent and Gram Stain showed No WBC seen and No Organisms seen; CX showed No growth Aerobically or Anaerobically  -WBC went from 3.6 ->  2.4 -Discussed with Dr. Zada Finders of Neurosurgery to weigh in now that Drainage was unsuccessful/limited and he feels that Neurosurgical Intervention is not currently warranted and recommending another course of prolonged Abx.   -Will need a prolonged Antibiotic Course due to Abscess and will continue IV Vancomycin for now; ID thinks she will not improve without drainage of the abscess and Neurosurgery does not feel like it needs to be drained.  -Still has uncontrolled Pain so will get Palliative Care  Consult for Symptom Management they have adjusted the opioid regimen Dr. Domingo Cocking increase the fentanyl patch to 50 mcg/h and continue oxycodone 10 mg every 4 hours as needed for first-line breakthrough  pain medication and continuing 1 mg IV Dilaudid as second line to be given 40 minutes after oral medication if it is ineffective to relieve her pain -Palliative medicine is also started a bowel regimen of senna 2 tabs nightly -PT/OT to evaluate and Treat as she appears very deconditioned and they are recommending skilled nursing facility but unclear if patient will accept skilled nursing facility at this time  Diabetes Mellitus Type 2 -C/w Lantus at lower than home dose at 20 units sq BID -Started Sensitive Novolog SS AC/HS  -CBGs ranging from84-178; Blood Sugar on CMP this AM was 146  HTN -Changed Metoprolol Tartrate 50 mg po TID to 12.5 mg po TID and will discontinue Amlodipine 5 mg po qHS -BP is now 140/55 -Continue to Monitor Blood Pressure Carefully    Obesity Estimated body mass index is 37.79 kg/m as calculated from the following:   Height as of this encounter: 5\' 1"  (1.549 m).   Weight as of this encounter: 90.7 kg. -Weight Loss and Dietary Counseling given   Graves Disease -C/w Propylthiouracil 75 mg po Daily   Fibromyalgia/Depression/RLS -C/w Aripiprazole 5 mg po Daily, Fluoxetine 60 mg po qHS, Clonazepam 0.5 mg po qHS, and Ropinirole 3 mg po qHS  Chronic Pain Syndrome -C/w Gabapentin 100 mg po TID,  -Fentanyl 12 mcg/hr Patch q72h changed to 50 mcg/hr Patch by Palliative Care Medicine, and IV Hydromorphone 1 mg q3hprn changed to q4hprn for Severe PainOxycodone 10 mg po q4hprn Moderate Pain  -Palliative Care evaluated for Symptom Management due to her uncontrolled Pain -Bowel Regimen with Miralax 17 gram po Daily (had stopped because patient was complaining of loose diarrhea but has had none); Senna 17.2 mg po QHS started by Palliative  and Bisacodyl 10 mg RC Rectal Pain Dailyprn  HLD -C/w Fenofibrate 54 m po qHS  GERD -C/w Pantoprazole 40 mg po Daily   Normocytic Anemia -Patient's Hb/Hct stable at 9.9/33.2 -Continue to Monitor for S/Sx of Bleeding -PT was 16.1  and INR was 1.3 -Checked Anemia Panel and showed iron level of 27, U IBC of 247, TIBC of 274, saturations of 10%, ferritin level 28, folate level 15.7, and vitamin B12 of 2063 -Repeat CBC in AM   Hypokalemia -Patient's K+ is now 3.6 -Continue to Monitor and Replete as Necessary -Repeat CMP in AM   Oral Candidiasis/Thursh, improving  -Nurse reported white patches on her tongue and likely from Abx -Start Nystatin Suspension -Given Fluconazole IV with Pharmacy to dose and likely can changed to po and will continue Fluconazole 200 mg po Daily for total of 7 days (Day 5/7) -Continue to Monitor   Hyponatremia -Patient's Na+ is 136 this AM -Continue to Monitor and Trend -Repeat CMP in AM  DVT prophylaxis: SCDs; Will add Heparin 5,000 units sq q8h Code Status: FULL CODE Family Communication: No family present at bedside  Disposition Plan: SNF if patient is agreeable; Severely Deconditioned and will need a prolonged Antibiotic Course  Consultants:   Infectious Diseases  Neurosurgery  Interventional Radiology   Procedures: None   Antimicrobials:  Anti-infectives (From admission, onward)   Start     Dose/Rate Route Frequency Ordered Stop   07/17/18 1600  vancomycin (VANCOCIN) 1,750 mg in sodium chloride 0.9 % 500 mL IVPB     1,750 mg 250 mL/hr over  120 Minutes Intravenous Every 24 hours 07/17/18 1541     07/17/18 1300  fluconazole (DIFLUCAN) tablet 200 mg     200 mg Oral Every 24 hours 07/16/18 1338     07/15/18 1400  fluconazole (DIFLUCAN) IVPB 200 mg  Status:  Discontinued     200 mg 100 mL/hr over 60 Minutes Intravenous Every 24 hours 07/14/18 1322 07/16/18 1338   07/14/18 1400  vancomycin (VANCOCIN) 1,000 mg in sodium chloride 0.9 % 250 mL IVPB  Status:  Discontinued     1,000 mg 250 mL/hr over 60 Minutes Intravenous Every 24 hours 07/13/18 1304 07/14/18 0752   07/14/18 1400  vancomycin (VANCOCIN) 1,250 mg in sodium chloride 0.9 % 250 mL IVPB  Status:  Discontinued      1,250 mg 166.7 mL/hr over 90 Minutes Intravenous Every 24 hours 07/14/18 0752 07/17/18 1541   07/14/18 1400  fluconazole (DIFLUCAN) IVPB 200 mg     200 mg 100 mL/hr over 60 Minutes Intravenous  Once 07/14/18 1303 07/14/18 1549   07/13/18 1345  vancomycin (VANCOCIN) 2,000 mg in sodium chloride 0.9 % 500 mL IVPB     2,000 mg 250 mL/hr over 120 Minutes Intravenous  Once 07/13/18 1301 07/13/18 1615     Subjective: Seen and examined at bedside and she states the pain was better controlled but still had some soreness and was worried about going home and became emotional.  PT worked with the patient and patient was unable to do anything because of pain and was unable to even roll.  She will need a prolonged antibiotic course and she is tearful because she hopes the Antibiotics can help the infection.  No other concerns or complaints at this time.  Objective: Vitals:   07/17/18 2050 07/17/18 2344 07/18/18 0527 07/18/18 1350  BP: 112/81  (!) 146/77 (!) 140/55  Pulse: 87 93 95 92  Resp: 18 20 18 18   Temp: 98 F (36.7 C)  97.8 F (36.6 C) 97.8 F (36.6 C)  TempSrc: Oral  Oral   SpO2: 93% 95% 100% 96%  Weight:      Height:        Intake/Output Summary (Last 24 hours) at 07/18/2018 1401 Last data filed at 07/18/2018 0900 Gross per 24 hour  Intake 1810 ml  Output 1150 ml  Net 660 ml   Filed Weights   07/11/18 1218  Weight: 90.7 kg   Examination: Physical Exam:  Constitutional: Well-nourished, well-developed obese Caucasian female currently appearing very tearful and depressed along with being fatigued and mildly uncomfortable Eyes: Lids and conjunctive are normal.  Sclera anicteric ENMT: External ears and nose appear normal.  Grossly normal hearing Neck: Appears supple no JVD Respiratory: Diminished auscultation bilaterally no appreciable wheezing, rales, rhonchi.  Patient not tachypneic or using any accessory muscles to breathe Cardiovascular: Regular rate and rhythm.  Has very trace  lower extremity edema Abdomen: Soft, nontender, distended secondary body habitus.  Bowel sounds present GU: Deferred Musculoskeletal: No contractures or cyanosis.  No joint deformities in upper extremities.  Has a right arm PICC line Skin: Skin is warm dry no appreciable rashes or lesions on to skin evaluation Neurologic: Cranial nerves II through XII grossly intact no appreciable focal deficits.  Romberg sign cerebellar reflexes were not assessed Psychiatric: Depressed appearing mood and flat affect and she is very tearful.  She is awake, alert, oriented  Data Reviewed: I have personally reviewed following labs and imaging studies  CBC: Recent Labs  Lab 07/14/18 0326 07/15/18  1761 07/16/18 1437 07/17/18 0339 07/18/18 0631  WBC 3.1* 3.4* 2.8* 3.2* 2.4*  NEUTROABS 2.1 2.3 1.6* 2.0 1.5*  HGB 9.1* 9.6* 9.4* 9.1* 9.9*  HCT 30.4* 32.1* 31.5* 30.3* 33.2*  MCV 80.0 79.1* 79.9* 79.5* 80.2  PLT 207 244 247 240 607   Basic Metabolic Panel: Recent Labs  Lab 07/14/18 0326 07/15/18 0412 07/16/18 1038 07/17/18 0339 07/18/18 0631  NA 137 137 134* 136 136  K 3.6 3.6 3.7 3.7 3.6  CL 102 102 100 101 100  CO2 25 26 26 26 25   GLUCOSE 117* 122* 111* 108* 146*  BUN 13 14 12 11 9   CREATININE 0.69 0.68 0.73 0.73 0.72  CALCIUM 9.6 9.6 9.2 9.5 9.8  MG 1.8 1.8 1.7 1.7 1.9  PHOS 3.4 3.2 2.8 3.2 3.7   GFR: Estimated Creatinine Clearance: 71 mL/min (by C-G formula based on SCr of 0.72 mg/dL). Liver Function Tests: Recent Labs  Lab 07/14/18 0326 07/15/18 0412 07/16/18 1038 07/17/18 0339 07/18/18 0631  AST 18 19 27 26  35  ALT 17 18 21 21 25   ALKPHOS 101 106 100 99 105  BILITOT 0.7 0.5 0.4 0.5 0.8  PROT 7.3 7.3 7.1 6.9 7.5  ALBUMIN 3.3* 3.3* 3.3* 3.2* 3.4*   No results for input(s): LIPASE, AMYLASE in the last 168 hours. No results for input(s): AMMONIA in the last 168 hours. Coagulation Profile: Recent Labs  Lab 07/13/18 0414  INR 1.3*   Cardiac Enzymes: No results for input(s):  CKTOTAL, CKMB, CKMBINDEX, TROPONINI in the last 168 hours. BNP (last 3 results) No results for input(s): PROBNP in the last 8760 hours. HbA1C: No results for input(s): HGBA1C in the last 72 hours. CBG: Recent Labs  Lab 07/17/18 1209 07/17/18 1732 07/17/18 2146 07/18/18 0727 07/18/18 1152  GLUCAP 122* 84 127* 120* 178*   Lipid Profile: No results for input(s): CHOL, HDL, LDLCALC, TRIG, CHOLHDL, LDLDIRECT in the last 72 hours. Thyroid Function Tests: No results for input(s): TSH, T4TOTAL, FREET4, T3FREE, THYROIDAB in the last 72 hours. Anemia Panel: No results for input(s): VITAMINB12, FOLATE, FERRITIN, TIBC, IRON, RETICCTPCT in the last 72 hours. Sepsis Labs: No results for input(s): PROCALCITON, LATICACIDVEN in the last 168 hours.  Recent Results (from the past 240 hour(s))  Culture, blood (routine x 2)     Status: None   Collection Time: 07/11/18  4:52 PM  Result Value Ref Range Status   Specimen Description   Final    BLOOD LEFT ARM Performed at [facility] [address] [place], [place] [address]    Special Requests   Final    BOTTLES DRAWN AEROBIC AND ANAEROBIC Blood Culture adequate volume   Culture   Final    NO GROWTH 5 DAYS Performed at [facility], [place] [address] [place], [place] [address]    Report Status 07/16/2018 FINAL  Final  Culture, blood (routine x 2)     Status: None   Collection Time: 07/11/18  4:58 PM  Result Value Ref Range Status   Specimen Description   Final    BLOOD LEFT ARM Performed at [facility] [address], [place], [place] [address]    Special Requests   Final    BOTTLES DRAWN AEROBIC AND ANAEROBIC Blood Culture adequate volume   Culture   Final    NO GROWTH 5 DAYS Performed at [facility], [place] [address] [place], [place] [address]    Report Status 07/16/2018 FINAL  Final  Aerobic/Anaerobic Culture (surgical/deep wound)     Status: None   Collection Time: 07/13/18 11:12  AM  Result Value Ref Range Status   Specimen Description   Final    ABSCESS LT PSOAS Performed at [facility] [address] [place], [place] [address]    Special Requests   Final    NONE Performed at [facility], [place] [address] [place], [place] [address]    Gram Stain NO WBC SEEN NO ORGANISMS SEEN   Final   Culture   Final    No growth aerobically or anaerobically. Performed at [facility], [place] [address] [place], [place] [address]    Report Status 07/18/2018 FINAL  Final     Radiology Studies: No results found. Scheduled Meds:  ARIPiprazole  5 mg Oral Daily   clonazePAM  0.5 mg Oral QHS   feeding supplement (ENSURE ENLIVE)  237 mL Oral BID BM   fenofibrate  54 mg Oral Q supper   fentaNYL  1 patch Transdermal Q72H   fluconazole  200 mg Oral Q24H   FLUoxetine  60 mg Oral QHS   gabapentin  100 mg Oral 3 times per day   heparin injection (subcutaneous)  5,000 Units Subcutaneous Q8H   insulin aspart  0-5 Units Subcutaneous QHS   insulin aspart  0-9 Units Subcutaneous TID WC   insulin glargine  20 Units Subcutaneous BID   metoprolol tartrate  12.5 mg Oral TID   nystatin  5 mL Oral QID   pantoprazole  40 mg Oral Daily   propylthiouracil  75 mg Oral Daily   rOPINIRole  3 mg Oral QHS   senna  2 tablet Oral QHS   sodium chloride flush  3 mL Intravenous Q12H   Continuous Infusions:  sodium chloride     vancomycin Stopped (07/17/18 1759)    LOS: 7 days    Kerney Elbe, DO [facility] PAGER is on AMION  If 7PM-7AM, please contact night-coverage [email or web address] Password [facility] 07/18/2018, 2:01 PM

## 2018-07-18 NOTE — Evaluation (Signed)
Occupational Therapy Evaluation Patient Details Name: [name] MRN: [id number] DOB: 1950-08-27 Today's Date: 07/18/2018    History of Present Illness [name] is a 68 y.o. female with history of diabetes mellitus type 2, sleep apnea, hypertension, Graves' disease, chronic and disease, anemia. Received epidural injection 05/23/2018. She is currently being treated for a lumbar discitis.  Pt admitted with continued back pain and found to have progression of abscess in epidural space of lumbar spine associated with Discitis and Osteomyelitis of L1-L2, and Progressive Abscess in Left Psoas Muscle   Clinical Impression   Pt was admitted for the above. Despite having had oral and IV pain medication, pain was still 9.5 with movement. She was able to tolerate rolling to bil sides. Will continue to follow in acute setting focusing on mobility for adls.  Pt was getting up and walking the day prior to admission.   She states that her family will do anything for her.    Follow Up Recommendations  SNF;Supervision/Assistance - 24 hour    Equipment Recommendations  3 in 1 bedside commode(possibly)    Recommendations for Other Services       Precautions / Restrictions Precautions Precautions: Fall;Back Restrictions Weight Bearing Restrictions: No      Mobility Bed Mobility Overal bed mobility: Needs Assistance Bed Mobility: Rolling Rolling: Mod assist         General bed mobility comments: to bil sides using bedrails and therapist assisting with chux pad.  Painful to lie on L side (hip).  pillow used between legs when rolling to R  Transfers                      Balance                                           ADL either performed or assessed with clinical judgement   ADL Overall ADL's : Needs assistance/impaired Eating/Feeding: Set up   Grooming: Set up   Upper Body Bathing: Minimal assistance   Lower Body Bathing: Total assistance;+2 for  physical assistance   Upper Body Dressing : Moderate assistance   Lower Body Dressing: Total assistance;+2 for physical assistance                 General ADL Comments: adls assessed from bed level. Pt rolled to bil sides but did not feel she could progress to sitting due to pain     Vision         Perception     Praxis      Pertinent Vitals/Pain Pain Assessment: 0-10 Pain Score: 10-Worst pain ever Pain Location: lower back, L groin Pain Descriptors / Indicators: Radiating;Sharp Pain Intervention(s): Limited activity within patient's tolerance;Monitored during session;Premedicated before session;Repositioned     Hand Dominance     Extremity/Trunk Assessment Upper Extremity Assessment Upper Extremity Assessment: Generalized weakness   Lower Extremity Assessment Lower Extremity Assessment: Generalized weakness       Communication Communication Communication: No difficulties   Cognition Arousal/Alertness: Awake/alert Behavior During Therapy: WFL for tasks assessed/performed Overall Cognitive Status: Within Functional Limits for tasks assessed                                     General Comments       Exercises  Shoulder Instructions      Home Living Family/patient expects to be discharged to:: Private residence Living Arrangements: Spouse/significant other;Children Available Help at Discharge: Family;Available PRN/intermittently Type of Home: House Home Access: Stairs to enter [employer] of Steps: 3 (can fit walker on each step) Entrance Stairs-Rails: Right;Left Home Layout: One level     Bathroom Shower/Tub: Occupational psychologist: Handicapped height     Home Equipment: Environmental consultant - 2 wheels;Wheelchair - manual;Hospital bed   Additional Comments: pt lives with son and husband who work. handicapped bathroom with [place] head and seat, has RW.      Prior Functioning/Environment Level of Independence:  Independent with assistive device(s)        Comments: has been using RW since back pain started this year.  Had been able to get up and use bathroom, but could not sit straight up until she was admitted to hospital. She did what she could tolerate and only wore robe a lot of the time        OT Problem List: Decreased strength;Decreased activity tolerance;Pain;Decreased knowledge of use of DME or AE(balance NT)      OT Treatment/Interventions: Self-care/ADL training;Therapeutic exercise;Energy conservation;DME and/or AE instruction;Balance training;Patient/family education;Therapeutic activities    OT Goals(Current goals can be found in the care plan section) Acute Rehab OT Goals Patient Stated Goal: less pain OT Goal Formulation: With patient Time For Goal Achievement: 08/01/18 Potential to Achieve Goals: Fair ADL Goals Pt Will Transfer to Toilet: with mod assist;stand pivot transfer;with +2 assist;bedside commode Additional ADL Goal #1: pt will roll to bil sides with rails with min guard for adls Additional ADL Goal #2: pt will go from sit to stand with mod +2 for adls Additional ADL Goal #3: pt will verbalize vs demonstrate use of AE for adls  OT Frequency: Min 2X/week   Barriers to D/C:            Co-evaluation              AM-PAC OT "6 Clicks" Daily Activity     Outcome Measure Help from another person eating meals?: A Little Help from another person taking care of personal grooming?: A Little Help from another person toileting, which includes using toliet, bedpan, or urinal?: Total Help from another person bathing (including washing, rinsing, drying)?: Total Help from another person to put on and taking off regular upper body clothing?: Total Help from another person to put on and taking off regular lower body clothing?: Total 6 Click Score: 10   End of Session    Activity Tolerance: Patient limited by pain Patient left: in bed;with call bell/phone within  reach  OT Visit Diagnosis: Pain;Muscle weakness (generalized) (M62.81) Pain - Right/Left: Left Pain - part of body: Hip                Time: 7096-2836 OT Time Calculation (min): 25 min Charges:  OT General Charges $OT Visit: 1 Visit OT Evaluation $OT Eval Low Complexity: 1 Low OT Treatments $Therapeutic Activity: 8-22 mins  Lesle Chris, OTR/L Acute Rehabilitation Services [phone] [facility] pager [phone] office 07/18/2018  [place] 07/18/2018, 2:50 PM

## 2018-07-18 NOTE — Progress Notes (Signed)
OT Cancellation Note  Patient Details Name: [name] MRN: [id number] DOB: 16-Aug-1950   Cancelled Treatment:    Reason Eval/Treat Not Completed: Pain limiting ability to participate.  Just finished using bedpan with nursing. Unable to tolerate at this time, per nursing  [facility] 07/18/2018, 10:28 AM  Lesle Chris, OTR/L Acute Rehabilitation Services [phone] [facility] pager [phone] office 07/18/2018

## 2018-07-19 LAB — COMPREHENSIVE METABOLIC PANEL
ALT: 28 U/L (ref 0–44)
AST: 39 U/L (ref 15–41)
Albumin: 3.1 g/dL — ABNORMAL LOW (ref 3.5–5.0)
Alkaline Phosphatase: 97 U/L (ref 38–126)
Anion gap: 7 (ref 5–15)
BUN: 12 mg/dL (ref 8–23)
CO2: 28 mmol/L (ref 22–32)
Calcium: 9.3 mg/dL (ref 8.9–10.3)
Chloride: 102 mmol/L (ref 98–111)
Creatinine, Ser: 0.75 mg/dL (ref 0.44–1.00)
GFR calc Af Amer: >60 mL/min (ref >60)
GFR calc non Af Amer: >60 mL/min (ref >60)
Glucose, Bld: 136 mg/dL — ABNORMAL HIGH (ref 70–99)
Potassium: 3.7 mmol/L (ref 3.5–5.1)
Sodium: 137 mmol/L (ref 135–145)
Total Bilirubin: 0.5 mg/dL (ref 0.3–1.2)
Total Protein: 6.8 g/dL (ref 6.5–8.1)

## 2018-07-19 LAB — CBC WITH DIFFERENTIAL/PLATELET
Abs Immature Granulocytes: 0.01 10*3/uL (ref 0.00–0.07)
Basophils Absolute: 0 10*3/uL (ref 0.0–0.1)
Basophils Relative: 0 %
Eosinophils Absolute: 0 10*3/uL (ref 0.0–0.5)
Eosinophils Relative: 0 %
HCT: 30.1 % — ABNORMAL LOW (ref 36.0–46.0)
Hemoglobin: 9.2 g/dL — ABNORMAL LOW (ref 12.0–15.0)
Immature Granulocytes: 0 %
Lymphocytes Relative: 27 %
Lymphs Abs: 0.7 10*3/uL (ref 0.7–4.0)
MCH: 24.5 pg — ABNORMAL LOW (ref 26.0–34.0)
MCHC: 30.6 g/dL (ref 30.0–36.0)
MCV: 80.1 fL (ref 80.0–100.0)
Monocytes Absolute: 0.4 10*3/uL (ref 0.1–1.0)
Monocytes Relative: 13 %
Neutro Abs: 1.6 10*3/uL — ABNORMAL LOW (ref 1.7–7.7)
Neutrophils Relative %: 60 %
Platelets: 262 10*3/uL (ref 150–400)
RBC: 3.76 MIL/uL — ABNORMAL LOW (ref 3.87–5.11)
RDW: 16.2 % — ABNORMAL HIGH (ref 11.5–15.5)
WBC: 2.6 10*3/uL — ABNORMAL LOW (ref 4.0–10.5)
nRBC: 0 % (ref 0.0–0.2)

## 2018-07-19 LAB — GLUCOSE, CAPILLARY
Glucose-Capillary: 109 mg/dL — ABNORMAL HIGH (ref 70–99)
Glucose-Capillary: 166 mg/dL — ABNORMAL HIGH (ref 70–99)
Glucose-Capillary: 159 mg/dL — ABNORMAL HIGH (ref 70–99)
Glucose-Capillary: 197 mg/dL — ABNORMAL HIGH (ref 70–99)

## 2018-07-19 LAB — PHOSPHORUS: Phosphorus: 3.2 mg/dL (ref 2.5–4.6)

## 2018-07-19 LAB — MAGNESIUM: Magnesium: 1.7 mg/dL (ref 1.7–2.4)

## 2018-07-19 MED ORDER — KETOROLAC TROMETHAMINE 15 MG/ML IJ SOLN
15.0000 mg | Freq: Four times a day (QID) | INTRAMUSCULAR | Status: AC | PRN
  Administered 2018-07-19 – 2018-07-24 (×7): 15 mg via INTRAVENOUS
  Filled 2018-07-19 (×7): qty 1

## 2018-07-19 MED ORDER — GABAPENTIN 100 MG PO CAPS
200.0000 mg | ORAL_CAPSULE | ORAL | Status: DC
  Administered 2018-07-19 – 2018-07-26 (×22): 200 mg via ORAL
  Filled 2018-07-19 (×22): qty 2

## 2018-07-19 MED ORDER — CYCLOBENZAPRINE HCL 10 MG PO TABS
10.0000 mg | ORAL_TABLET | Freq: Every evening | ORAL | Status: DC | PRN
  Administered 2018-07-20 – 2018-07-30 (×3): 10 mg via ORAL
  Filled 2018-07-19 (×3): qty 1

## 2018-07-19 MED ORDER — KETOROLAC TROMETHAMINE 30 MG/ML IJ SOLN: 30.0000 mg | Freq: Three times a day (TID) | INTRAMUSCULAR | Status: DC | PRN

## 2018-07-19 NOTE — Progress Notes (Signed)
Occupational Therapy Treatment Patient Details Name: [name] MRN: [id number] DOB: 1950/11/24 Today's Date: 07/19/2018    History of present illness ABBYE LAO is a 68 y.o. female with history of diabetes mellitus type 2, sleep apnea, hypertension, Graves' disease, chronic and disease, anemia. Received epidural injection 05/23/2018. She is currently being treated for a lumbar discitis.  Pt admitted with continued back pain and found to have progression of abscess in epidural space of lumbar spine associated with Discitis and Osteomyelitis of L1-L2, and Progressive Abscess in Left Psoas Muscle   OT comments  Pt able to stand twice and get up to chair today;  Mod A bed mobility and min +2 for standing and SPT.  Pt stretching legs in bed. Will introduce AE on next visit  Follow Up Recommendations  SNF    Equipment Recommendations  3 in 1 bedside commode    Recommendations for Other Services      Precautions / Restrictions Precautions Precautions: Fall;Back Restrictions Weight Bearing Restrictions: No       Mobility Bed Mobility     Rolling: Mod assist Sidelying to sit: Mod assist   Sit to supine: Min assist   General bed mobility comments: pt needed to rest back; return to supine after first stand.  She initiated lying back down and mod A given for bil LEs.  Light assist for legs and mod A for trunk to sit up from sidelying  Transfers Overall transfer level: Needs assistance Equipment used: Rolling walker (2 wheeled) Transfers: Sit to/from [employer] Sit to Stand: Min assist;+2 safety/equipment Stand pivot transfers: Min assist;+2 safety/equipment       General transfer comment: light assist to stand and steady    Balance                                           ADL either performed or assessed with clinical judgement   ADL   Eating/Feeding: Set up;Sitting   Grooming: Set up;Sitting;Wash/dry face;Oral care                   Toilet Transfer: Minimal assistance;+2 for safety/equipment   Toileting- Clothing Manipulation and Hygiene: Total assistance         General ADL Comments: simulated transfer to 3:1--min +2 assist for safety.  Pt got into recliner for grooming activities and to have sheets changed     Vision       Perception     Praxis      Cognition Arousal/Alertness: Awake/alert Behavior During Therapy: WFL for tasks assessed/performed Overall Cognitive Status: Within Functional Limits for tasks assessed                                          Exercises     Shoulder Instructions       General Comments      Pertinent Vitals/ Pain       Pain Assessment: 0-10 Pain Score: 9  Pain Location: lower back Pain Descriptors / Indicators: Radiating;Sharp Pain Intervention(s): Limited activity within patient's tolerance;Monitored during session;Premedicated before session;Repositioned  Home Living  Prior Functioning/Environment              Frequency           Progress Toward Goals  OT Goals(current goals can now be found in the care plan section)  Progress towards OT goals: Progressing toward goals     Plan      Co-evaluation    PT/OT/SLP Co-Evaluation/Treatment: Yes Reason for Co-Treatment: For patient/therapist safety PT goals addressed during session: Mobility/safety with mobility OT goals addressed during session: ADL's and self-care      AM-PAC OT "6 Clicks" Daily Activity     Outcome Measure   Help from another person eating meals?: A Little Help from another person taking care of personal grooming?: A Little Help from another person toileting, which includes using toliet, bedpan, or urinal?: Total Help from another person bathing (including washing, rinsing, drying)?: A Lot Help from another person to put on and taking off regular upper body clothing?: A Lot Help from  another person to put on and taking off regular lower body clothing?: Total 6 Click Score: 12    End of Session    OT Visit Diagnosis: Pain;Muscle weakness (generalized) (M62.81) Pain - Right/Left: Left Pain - part of body: Hip   Activity Tolerance Patient tolerated treatment well   Patient Left in chair;with call bell/phone within reach;with chair alarm set   Nurse Communication Mobility status(needs sheets changed)        Time: 4287-6811 OT Time Calculation (min): 37 min  Charges: OT General Charges $OT Visit: 1 Visit OT Treatments $Therapeutic Activity: 8-22 mins  Lesle Chris, OTR/L Acute Rehabilitation Services [phone] [facility] pager [phone] office 07/19/2018   [place] 07/19/2018, 10:00 AM

## 2018-07-19 NOTE — Progress Notes (Signed)
PROGRESS NOTE    [name]  ZOX:[id number] DOB: 09/11/1950 DOA: 07/11/2018 PCP: Crist Infante, MD   Brief Narrative:  HPI on 07/11/2018 by Dr. Debbe Odea [name] is a 68 y.o. female with medical history of DM 2 who presents with lower back pain. She is currently being treated for a lumbar discitis with Methicillin resistant coag neg staph with Vancomycin at home. She developed increasing back pain about 1 wk ago and noted a fever of about 100 degrees. She has had worsening of her pain with it radiating to her left buttock. She has no other complaints.   Interim history Admitted with abscess in the epidural space of lumbar spine associated with discitis and osteomyelitis.  Currently will require 4 weeks of IV vancomycin.  Plan of care consulted for pain management. Assessment & Plan   Abscess in epidural space of lumbar spine associated with discitis and osteomyelitis of L1-L2/myositis and bilateral psoas muscles, progressive abscess in left psoas muscle -Previously MRSE -Interventional radiology consulted and appreciated-wanted infectious disease and neurosurgery input -Neurosurgery consulted and appreciated, advised that repeat needle biopsy of the elbow wound would be preferable to an open surgical decompression -Infectious disease consulted and appreciated, recommended drainage of the abscess and holding antibiotics pending culture, and then starting vancomycin- plan to continue through 08/12/2018 with reimaging after 4 weeks -Status post underwent CT aspiration but there was no significant drainage, only a few drops of blood aspirated from left psoas site -Blood culture showed no growth to date -Aerobic and anaerobic cultures and Gram stain showed no organisms -Case was discussed with neurosurgery, Dr. Zada Finders, felt that no surgical recommendation was warranted at this time and recommended prolonged course of antibiotics -Continue pain control, palliative care was consulted  for symptom management -PT and OT recommended SNF placement -Discussed with social work, patient and husband would like for her to return home  Diabetes mellitus, type II -Continue Lantus, insulin sliding scale with CBG monitoring  Essential hypertension -Continue metoprolol  Graves' disease -Continue PTU  Fibromyalgia/depression/restless leg syndrome -Continue aripiprazole, fluoxetine, clonazepam, ropinirole  Chronic pain syndrome -Continue gabapentin, will increase dose today -Will restart patient's Flexeril -Palliative care consulted and appreciated for pain control -Continue bowel regimen  Hyperlipidemia -Continue fenofibrate  Normocytic anemia -Anemia panel showed iron of 27, U IBC 247, TIBC 274, saturation ratio of 10%, ferritin 28, folate 15.7, B12 2063 -Stable, hemoglobin currently 9.2, baseline appears between 8 and 9  Hypokalemia -Resolved with replacement, continue to monitor BMP  Hyponatremia -Resolved, continue to monitor BMP  Oral candidiasis/thrush -Appears to be improving -Nurse had reported white patches on patient's tongue -Patient was given IV fluconazole and was placed on 200 orally thereafter-to continue through 07/20/2018  Obesity, morbid -BMI of 37.79 -Patient will need to follow-up with her PCP regarding lifestyle modifications  DVT Prophylaxis  Heparin  Code Status: Full  Family Communication: None at bedside. Husband via phone.  Disposition Plan: Admitted. Dispo pending. Patient wanting to go home, however not a safe discharge given that she is a total assist.   Consultants Infectious disease Interventional radiology Neurosurgery  Procedures  None  Antibiotics   Anti-infectives (From admission, onward)   Start     Dose/Rate Route Frequency Ordered Stop   07/17/18 1600  vancomycin (VANCOCIN) 1,750 mg in sodium chloride 0.9 % 500 mL IVPB     1,750 mg 250 mL/hr over 120 Minutes Intravenous Every 24 hours 07/17/18 1541      07/17/18 1300  fluconazole (DIFLUCAN)  tablet 200 mg     200 mg Oral Every 24 hours 07/16/18 1338     07/15/18 1400  fluconazole (DIFLUCAN) IVPB 200 mg  Status:  Discontinued     200 mg 100 mL/hr over 60 Minutes Intravenous Every 24 hours 07/14/18 1322 07/16/18 1338   07/14/18 1400  vancomycin (VANCOCIN) 1,000 mg in sodium chloride 0.9 % 250 mL IVPB  Status:  Discontinued     1,000 mg 250 mL/hr over 60 Minutes Intravenous Every 24 hours 07/13/18 1304 07/14/18 0752   07/14/18 1400  vancomycin (VANCOCIN) 1,250 mg in sodium chloride 0.9 % 250 mL IVPB  Status:  Discontinued     1,250 mg 166.7 mL/hr over 90 Minutes Intravenous Every 24 hours 07/14/18 0752 07/17/18 1541   07/14/18 1400  fluconazole (DIFLUCAN) IVPB 200 mg     200 mg 100 mL/hr over 60 Minutes Intravenous  Once 07/14/18 1303 07/14/18 1549   07/13/18 1345  vancomycin (VANCOCIN) 2,000 mg in sodium chloride 0.9 % 500 mL IVPB     2,000 mg 250 mL/hr over 120 Minutes Intravenous  Once 07/13/18 1301 07/13/18 1615      Subjective:   [name] seen and examined today.  Continues to complain of pain, states 7/10. Denies chest pain, shortness of breath, abdominal pain, nausea, vomiting, headache, dizziness. Complains of back pain.   Objective:   Vitals:   07/18/18 2055 07/18/18 2225 07/19/18 0606 07/19/18 1404  BP:  (!) 137/49 (!) 147/84 132/64  Pulse: 88 87 83 81  Resp: 18 18 18 18   Temp:  98.2 F (36.8 C) 98.2 F (36.8 C) 98 F (36.7 C)  TempSrc:  Oral Oral Oral  SpO2:  96% 99% 97%  Weight:      Height:        Intake/Output Summary (Last 24 hours) at 07/19/2018 1533 Last data filed at 07/19/2018 1400 Gross per 24 hour  Intake 1290 ml  Output 900 ml  Net 390 ml   Filed Weights   07/11/18 1218  Weight: 90.7 kg    Exam  General: Well developed, morbidly obese, chronically ill-appearing, NAD  HEENT: NCAT, mucous membranes moist.   Neck: Supple  Cardiovascular: S1 S2 auscultated, RRR (heart sounds are distant),  no murmur appreciated   Respiratory: Clear to auscultation bilaterally (anteriorly)  Abdomen: Soft, obese, nontender, nondistended, + bowel sounds  Extremities: warm dry without cyanosis clubbing or edema  Neuro: AAOx3, nonfocal  Psych: Appropriate mood and affect   Data Reviewed: I have personally reviewed following labs and imaging studies  CBC: Recent Labs  Lab 07/15/18 0412 07/16/18 1437 07/17/18 0339 07/18/18 0631 07/19/18 0409  WBC 3.4* 2.8* 3.2* 2.4* 2.6*  NEUTROABS 2.3 1.6* 2.0 1.5* 1.6*  HGB 9.6* 9.4* 9.1* 9.9* 9.2*  HCT 32.1* 31.5* 30.3* 33.2* 30.1*  MCV 79.1* 79.9* 79.5* 80.2 80.1  PLT 244 247 240 236 700   Basic Metabolic Panel: Recent Labs  Lab 07/15/18 0412 07/16/18 1038 07/17/18 0339 07/18/18 0631 07/19/18 0409  NA 137 134* 136 136 137  K 3.6 3.7 3.7 3.6 3.7  CL 102 100 101 100 102  CO2 26 26 26 25 28   GLUCOSE 122* 111* 108* 146* 136*  BUN 14 12 11 9 12   CREATININE 0.68 0.73 0.73 0.72 0.75  CALCIUM 9.6 9.2 9.5 9.8 9.3  MG 1.8 1.7 1.7 1.9 1.7  PHOS 3.2 2.8 3.2 3.7 3.2   GFR: Estimated Creatinine Clearance: 71 mL/min (by C-G formula based on SCr of 0.75  mg/dL). Liver Function Tests: Recent Labs  Lab 07/15/18 0412 07/16/18 1038 07/17/18 0339 07/18/18 0631 07/19/18 0409  AST 19 27 26  35 39  ALT 18 21 21 25 28   ALKPHOS 106 100 99 105 97  BILITOT 0.5 0.4 0.5 0.8 0.5  PROT 7.3 7.1 6.9 7.5 6.8  ALBUMIN 3.3* 3.3* 3.2* 3.4* 3.1*   No results for input(s): LIPASE, AMYLASE in the last 168 hours. No results for input(s): AMMONIA in the last 168 hours. Coagulation Profile: Recent Labs  Lab 07/13/18 0414  INR 1.3*   Cardiac Enzymes: No results for input(s): CKTOTAL, CKMB, CKMBINDEX, TROPONINI in the last 168 hours. BNP (last 3 results) No results for input(s): PROBNP in the last 8760 hours. HbA1C: No results for input(s): HGBA1C in the last 72 hours. CBG: Recent Labs  Lab 07/18/18 1152 07/18/18 1653 07/18/18 2202 07/19/18 0752  07/19/18 1148  GLUCAP 178* 158* 102* 109* 166*   Lipid Profile: No results for input(s): CHOL, HDL, LDLCALC, TRIG, CHOLHDL, LDLDIRECT in the last 72 hours. Thyroid Function Tests: No results for input(s): TSH, T4TOTAL, FREET4, T3FREE, THYROIDAB in the last 72 hours. Anemia Panel: No results for input(s): VITAMINB12, FOLATE, FERRITIN, TIBC, IRON, RETICCTPCT in the last 72 hours. Urine analysis:    Component Value Date/Time   COLORURINE YELLOW 07/14/2018 0914   APPEARANCEUR CLEAR 07/14/2018 0914   LABSPEC 1.013 07/14/2018 0914   PHURINE 6.0 07/14/2018 0914   GLUCOSEU NEGATIVE 07/14/2018 0914   HGBUR NEGATIVE 07/14/2018 0914   BILIRUBINUR NEGATIVE 07/14/2018 0914   KETONESUR 5 (A) 07/14/2018 0914   PROTEINUR NEGATIVE 07/14/2018 0914   UROBILINOGEN 0.2 07/20/2014 1707   NITRITE NEGATIVE 07/14/2018 0914   LEUKOCYTESUR NEGATIVE 07/14/2018 0914   Sepsis Labs: @LABRCNTIP (procalcitonin:4,lacticidven:4)  ) Recent Results (from the past 240 hour(s))  Culture, blood (routine x 2)     Status: None   Collection Time: 07/11/18  4:52 PM  Result Value Ref [place] Status   Specimen Description   Final    BLOOD LEFT ARM Performed at [facility], [place] [address], [place], [place] [address]    Special Requests   Final    BOTTLES DRAWN AEROBIC AND ANAEROBIC Blood Culture adequate volume   Culture   Final    NO GROWTH 5 DAYS Performed at [facility], [place] [address], [place], [place] [address]    Report Status 07/16/2018 FINAL  Final  Culture, blood (routine x 2)     Status: None   Collection Time: 07/11/18  4:58 PM  Result Value Ref [place] Status   Specimen Description   Final    BLOOD LEFT ARM Performed at [place] [address] [place], [place] [address]    Special Requests   Final    BOTTLES DRAWN AEROBIC AND ANAEROBIC Blood Culture adequate volume   Culture   Final    NO GROWTH 5 DAYS Performed at [facility], [place]  [address] [place], [place] [address]    Report Status 07/16/2018 FINAL  Final  Aerobic/Anaerobic Culture (surgical/deep wound)     Status: None   Collection Time: 07/13/18 11:12 AM  Result Value Ref [place] Status   Specimen Description   Final    ABSCESS LT PSOAS Performed at [facility], [place] [address] [place], [place] [address]    Special Requests   Final    NONE Performed at [facility], [place] [address], [place],  [address]  Gram Stain NO WBC SEEN NO ORGANISMS SEEN   Final   Culture   Final    No growth aerobically or anaerobically. Performed at [facility], [place] [address] [place], [place] [address]    Report Status 07/18/2018 FINAL  Final      Radiology Studies: No results found.   Scheduled Meds: . ARIPiprazole  5 mg Oral Daily  . clonazePAM  0.5 mg Oral QHS  . feeding supplement (ENSURE ENLIVE)  237 mL Oral BID BM  . fenofibrate  54 mg Oral Q supper  . fentaNYL  1 patch Transdermal Q72H  . fluconazole  200 mg Oral Q24H  . FLUoxetine  60 mg Oral QHS  . gabapentin  200 mg Oral 3 times per day  . heparin injection (subcutaneous)  5,000 Units Subcutaneous Q8H  . insulin aspart  0-5 Units Subcutaneous QHS  . insulin aspart  0-9 Units Subcutaneous TID WC  . insulin glargine  20 Units Subcutaneous BID  . metoprolol tartrate  12.5 mg Oral TID  . nystatin  5 mL Oral QID  . pantoprazole  40 mg Oral Daily  . propylthiouracil  75 mg Oral Daily  . rOPINIRole  3 mg Oral QHS  . senna  2 tablet Oral QHS  . sodium chloride flush  3 mL Intravenous Q12H   Continuous Infusions: . sodium chloride    . vancomycin Stopped (07/18/18 1933)     LOS: 8 days   Time Spent in minutes   45 minutes  (greater than 50% of time spent with patient face to face, as well as reviewing old records, calling consults, and formulating a plan as well as speaking with the husband)   Cristal Ford D.O. on 07/19/2018 at 3:33 PM  Between 7am to  7pm - Please see pager noted on [email or web address]  After 7pm go to [email or web address]  And look for the night coverage person covering for me after hours  [facility] Office  [phone]

## 2018-07-19 NOTE — Progress Notes (Signed)
Physical Therapy Treatment Patient Details Name: [name] MRN: [id number] DOB: [birth year] Today's Date: 07/19/2018    History of Present Illness [name] is a 68 y.o. female with history of diabetes mellitus type 2, sleep apnea, hypertension, Graves' disease, chronic and disease, anemia. Received epidural injection 05/23/2018. She is currently being treated for a lumbar discitis.  Pt admitted with continued back pain and found to have progression of abscess in epidural space of lumbar spine associated with Discitis and Osteomyelitis of L1-L2, and Progressive Abscess in Left Psoas Muscle    PT Comments    Patient seen for mobility progression. Patient able to sit EOB x 2 and stand x 2 with pivot transfer to recliner with RW. Requested rest break following initial sitting. Patient stating pain limiting throughout, however seemingly tolerable to all activity. Patient educated on PT recommendations and goals prior to returning home for general safety. Will attempt to further progress gait at next visit as patient tolerates.     Follow Up Recommendations  SNF;Supervision/Assistance - 24 hour     Equipment Recommendations  None recommended by PT    Recommendations for Other Services       Precautions / Restrictions Precautions Precautions: Fall;Back Restrictions Weight Bearing Restrictions: No    Mobility  Bed Mobility Overal bed mobility: Needs Assistance Bed Mobility: Rolling;Sidelying to Sit;Sit to Supine Rolling: Mod assist Sidelying to sit: Mod assist   Sit to supine: Min assist   General bed mobility comments: pt needed to rest back; return to supine after first stand.  She initiated lying back down and mod A given for bil LEs.  Light assist for legs and mod A for trunk to sit up from sidelying  Transfers Overall transfer level: Needs assistance Equipment used: Rolling walker (2 wheeled) Transfers: Sit to/from [facility] Sit to Stand: Min  assist;+2 safety/equipment Stand pivot transfers: Min assist;+2 safety/equipment       General transfer comment: light assist to stand and steady  Ambulation/Gait             General Gait Details: deferred - only taking a few pivotal steps towards chair   Stairs             Wheelchair Mobility    Modified Rankin (Stroke Patients Only)       Balance Overall balance assessment: Needs assistance Sitting-balance support: Bilateral upper extremity supported;Feet supported Sitting balance-Leahy Scale: Fair     Standing balance support: Bilateral upper extremity supported;During functional activity Standing balance-Leahy Scale: Poor                              Cognition Arousal/Alertness: Awake/alert Behavior During Therapy: WFL for tasks assessed/performed Overall Cognitive Status: Within Functional Limits for tasks assessed                                        Exercises      General Comments        Pertinent Vitals/Pain Pain Assessment: 0-10 Pain Score: 7  Pain Location: lower back Pain Descriptors / Indicators: Radiating;Sharp Pain Intervention(s): Limited activity within patient's tolerance;Monitored during session;Repositioned    Home Living                      Prior Function  PT Goals (current goals can now be found in the care plan section) Acute Rehab PT Goals Patient Stated Goal: less pain PT Goal Formulation: With patient Time For Goal Achievement: 08/01/18 Potential to Achieve Goals: Fair Progress towards PT goals: Progressing toward goals    Frequency    Min 3X/week      PT Plan Current plan remains appropriate    Co-evaluation PT/OT/SLP Co-Evaluation/Treatment: Yes Reason for Co-Treatment: For patient/therapist safety;To address functional/ADL transfers PT goals addressed during session: Mobility/safety with mobility;Balance;Proper use of DME;Strengthening/ROM OT goals  addressed during session: ADL's and self-care      AM-PAC PT "6 Clicks" Mobility   Outcome Measure  Help needed turning from your back to your side while in a flat bed without using bedrails?: A Lot Help needed moving from lying on your back to sitting on the side of a flat bed without using bedrails?: A Lot Help needed moving to and from a bed to a chair (including a wheelchair)?: A Lot Help needed standing up from a chair using your arms (e.g., wheelchair or bedside chair)?: A Little Help needed to walk in hospital room?: A Lot Help needed climbing 3-5 steps with a railing? : Total 6 Click Score: 12    End of Session   Activity Tolerance: Patient tolerated treatment well Patient left: in chair;with call bell/phone within reach;with chair alarm set Nurse Communication: Mobility status PT Visit Diagnosis: Other abnormalities of gait and mobility (R26.89)     Time: 6754-4920 PT Time Calculation (min) (ACUTE ONLY): 33 min  Charges:  $Therapeutic Activity: 8-22 mins                      Lanney Gins, PT, DPT Supplemental Physical Therapist 07/19/18 10:56 AM Pager: [phone] Office: [phone]
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
## 2018-07-19 NOTE — Plan of Care (Signed)
Plan of care reviewed and discussed with the patient. 

## 2018-07-19 NOTE — Progress Notes (Addendum)
Patient gave CSW permission to discuss discharge planning with her spouse. Spouse states he prefers the patient to return home with home health. He reports he is concern about the patient going to SNF facility during the pandemic. He reports the patient used [facility] services in the past, and would like to have services reinstated.  CSW informed the patient spouse, the patient is currently requiring maximum assistance at this time. He reports understanding.  CSW discussed with nurse and physician.   Kathrin Greathouse, Marlinda Mike, MSW Clinical Social Worker  [phone] 07/19/2018  3:23 PM

## 2018-07-20 ENCOUNTER — Inpatient Hospital Stay (HOSPITAL_COMMUNITY): Payer: Medicare Other

## 2018-07-20 LAB — CBC
HCT: 29.3 % — ABNORMAL LOW (ref 36.0–46.0)
Hemoglobin: 8.9 g/dL — ABNORMAL LOW (ref 12.0–15.0)
MCH: 24.5 pg — ABNORMAL LOW (ref 26.0–34.0)
MCHC: 30.4 g/dL (ref 30.0–36.0)
MCV: 80.5 fL (ref 80.0–100.0)
Platelets: 238 10*3/uL (ref 150–400)
RBC: 3.64 MIL/uL — ABNORMAL LOW (ref 3.87–5.11)
RDW: 16.1 % — ABNORMAL HIGH (ref 11.5–15.5)
WBC: 2.5 10*3/uL — ABNORMAL LOW (ref 4.0–10.5)
nRBC: 0 % (ref 0.0–0.2)

## 2018-07-20 LAB — BASIC METABOLIC PANEL
Anion gap: 8 (ref 5–15)
BUN: 15 mg/dL (ref 8–23)
CO2: 28 mmol/L (ref 22–32)
Calcium: 9.5 mg/dL (ref 8.9–10.3)
Chloride: 101 mmol/L (ref 98–111)
Creatinine, Ser: 0.84 mg/dL (ref 0.44–1.00)
GFR calc Af Amer: >60 mL/min (ref >60)
GFR calc non Af Amer: >60 mL/min (ref >60)
Glucose, Bld: 133 mg/dL — ABNORMAL HIGH (ref 70–99)
Potassium: 3.8 mmol/L (ref 3.5–5.1)
Sodium: 137 mmol/L (ref 135–145)

## 2018-07-20 LAB — GLUCOSE, CAPILLARY
Glucose-Capillary: 100 mg/dL — ABNORMAL HIGH (ref 70–99)
Glucose-Capillary: 163 mg/dL — ABNORMAL HIGH (ref 70–99)
Glucose-Capillary: 157 mg/dL — ABNORMAL HIGH (ref 70–99)
Glucose-Capillary: 162 mg/dL — ABNORMAL HIGH (ref 70–99)

## 2018-07-20 LAB — MAGNESIUM: Magnesium: 1.8 mg/dL (ref 1.7–2.4)

## 2018-07-20 NOTE — Progress Notes (Signed)
Occupational Therapy Treatment Patient Details Name: [name] MRN: [id number] DOB: 1950/05/23 Today's Date: 07/20/2018    History of present illness [name] is a 68 y.o. female with history of diabetes mellitus type 2, sleep apnea, hypertension, Graves' disease, chronic and disease, anemia. Received epidural injection 05/23/2018. She is currently being treated for a lumbar discitis.  Pt admitted with continued back pain and found to have progression of abscess in epidural space of lumbar spine associated with Discitis and Osteomyelitis of L1-L2, and Progressive Abscess in Left Psoas Muscle   OT comments  Pt is making good progress with OT.  Continue to recommend SNF unless she can have 24/7 at home. Also recommend 3:1.  Transferred to chair and educated on AE for adls  Follow Up Recommendations  SNF    Equipment Recommendations  3 in 1 bedside commode    Recommendations for Other Services      Precautions / Restrictions Precautions Precautions: Fall;Back Restrictions Weight Bearing Restrictions: No       Mobility Bed Mobility     Rolling: Min assist Sidelying to sit: Min assist;Mod assist       General bed mobility comments: min A to ensure log roll and min/mod for trunk to sit up  Transfers Overall transfer level: Needs assistance Equipment used: Rolling walker (2 wheeled)   Sit to Stand: Min assist Stand pivot transfers: Min assist;Mod assist       General transfer comment: assist to rise and steady.  Pt with posterior bias with transfer to chair.  States this happened prior to admission, since when she was dx'd with Grave's Disease    Balance             Standing balance-Leahy Scale: Poor                             ADL either performed or assessed with clinical judgement   ADL                           Toilet Transfer: Minimal assistance;Moderate assistance;Stand-pivot;RW(chair)             General ADL  Comments: educated on AE for adls. Used sock aide with mod A. Pt has a long sponge and reacher at home.  Asked NT to get a 3:1 for room; purewick left off. Pt has a tendency to lose balance posteriorly. Gait belt in room     Vision       Perception     Praxis      Cognition Arousal/Alertness: Awake/alert Behavior During Therapy: WFL for tasks assessed/performed Overall Cognitive Status: Within Functional Limits for tasks assessed                                          Exercises     Shoulder Instructions       General Comments      Pertinent Vitals/ Pain       Pain Score: 8  Pain Location: lower back Pain Descriptors / Indicators: Radiating;Sharp Pain Intervention(s): Limited activity within patient's tolerance;Monitored during session;Premedicated before session;Repositioned;Patient requesting pain meds-RN notified  Home Living  Prior Functioning/Environment              Frequency  Min 2X/week        Progress Toward Goals  OT Goals(current goals can now be found in the care plan section)  Progress towards OT goals: Progressing toward goals     Plan      Co-evaluation                 AM-PAC OT "6 Clicks" Daily Activity     Outcome Measure   Help from another person eating meals?: A Little Help from another person taking care of personal grooming?: A Little Help from another person toileting, which includes using toliet, bedpan, or urinal?: A Lot Help from another person bathing (including washing, rinsing, drying)?: A Little Help from another person to put on and taking off regular upper body clothing?: A Lot Help from another person to put on and taking off regular lower body clothing?: A Lot 6 Click Score: 15    End of Session    OT Visit Diagnosis: Pain;Muscle weakness (generalized) (M62.81) Pain - Right/Left: Left Pain - part of body: Hip   Activity  Tolerance Patient tolerated treatment well   Patient Left in chair;with call bell/phone within reach;with chair alarm set   Nurse Communication          Time: 2111-5520 OT Time Calculation (min): 24 min  Charges: OT General Charges $OT Visit: 1 Visit OT Treatments $Self Care/Home Management : 8-22 mins $Therapeutic Activity: 8-22 mins  Lesle Chris, OTR/L Acute Rehabilitation Services [phone] [facility] pager [phone] office 07/20/2018   [place] 07/20/2018, 12:18 PM

## 2018-07-20 NOTE — Progress Notes (Signed)
Pharmacy Antibiotic Note  [name] is a 68 y.o. female with lumbar discitis caused by methicillin resistant CoNS in March 2020 and was on vancomycin PTA. Vancomycin course stated 06/03/18 with a planned 6 week duration, expected to end on 07/16/18. Most recent regimen was vancomycin 1000 mg IV q24h. Last dose of vancomycin PTA was 07/10/18, antibiotics were held on admission and resumed on 4/23.  With limited drainage from CT aspiration, ID recommends treating for another 4 weeks with abx (treat thru 5/23).  Today, 07/20/2018: - afeb, wbc low - scr stable   Plan: - continue vancomycin 1750 mg IV q24h. Will check vancomycin trough level prior to today's does to assess current regimen. - fluconazole 200 mg daily - monitor renal function  ____________________________________________  Height: 5\' 1"  (154.9 cm) Weight: 200 lb (90.7 kg) IBW/kg (Calculated) : 47.8  Temp (24hrs), Avg:98 F (36.7 C), Min:97.7 F (36.5 C), Max:98.4 F (36.9 C)  Recent Labs  Lab 07/16/18 1038 07/16/18 1437 07/17/18 0339 07/17/18 1345 07/18/18 0631 07/19/18 0409 07/20/18 0428  WBC  --  2.8* 3.2*  --  2.4* 2.6* 2.5*  CREATININE 0.73  --  0.73  --  0.72 0.75 0.84  VANCOTROUGH  --   --   --  10*  --   --   --     Estimated Creatinine Clearance: 67.6 mL/min (by C-G formula based on SCr of 0.84 mg/dL).    Allergies  Allergen Reactions  . Imitrex [Sumatriptan] Other (See Comments)    Vascular spasms  . Nsaids Other (See Comments)    PT UNABLE TO TOLERATE NSAID'S DRUGS DUE TO HX OF GASTRIC SLEEVE SURGERY  . Codeine Nausea Only  . Promethazine Hcl Other (See Comments)    Restless leg feeling all over body  . Rosuvastatin Calcium Other (See Comments)    Leg/muscle pain  . Statins Other (See Comments)    Leg/muscle pain  . Sulfonamide Derivatives Hives    Childhood allergy   . Vicodin [Hydrocodone-Acetaminophen] Nausea And Vomiting    Antimicrobials this admission: Prior to  admission: Vancomycin 06/03/18 > 07/10/18 (planned stop 4/26) --> but now plan to cont with another prolonged course of abx  Vancomycin 4/23 >> Fluconazole 4/24 ( oropharyngeal candidiasis) >>  Dose adjustment: 4/27 at 1345 VT= 10 --> changed to 1750 mg IV q24h  Microbiology results: 4/21 BCx: neg FINAL 4/23 abscess, L psoas:  neg FINAL  Previous recent cultures: 3/17 Urine: >100K pseudomonas, proteus 3/14 aspiration of L1,L2 disc space: abundant staphylococcus epidermidis (oxacillin resistant)  Thank you for allowing pharmacy to be a part of this patient's care.  Lynelle Doctor 07/20/2018 10:16 AM
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
## 2018-07-20 NOTE — Progress Notes (Signed)
Patient ID: [name], female   DOB: 1950-11-29, 67 y.o.   MRN: [id number] Spoke with Dr Ree Kida. Patient has not made any progress from pain management or mobilization perspective. Aspiration did not yield much fluid or new pathogen. I have suggested doing CT of lumbar spaine to see how much bony destruction there is, to see if reconsideration of surgery for debridement and stabilization is appropriate.

## 2018-07-20 NOTE — Progress Notes (Signed)
PROGRESS NOTE    [name]  QMV:[id number] DOB: 1951-02-08 DOA: 07/11/2018 PCP: Crist Infante, MD   Brief Narrative:  HPI on 07/11/2018 by Dr. Debbe Odea [name] is a 68 y.o. female with medical history of DM 2 who presents with lower back pain. She is currently being treated for a lumbar discitis with Methicillin resistant coag neg staph with Vancomycin at home. She developed increasing back pain about 1 wk ago and noted a fever of about 100 degrees. She has had worsening of her pain with it radiating to her left buttock. She has no other complaints.   Interim history Admitted with abscess in the epidural space of lumbar spine associated with discitis and osteomyelitis.  Currently will require 4 weeks of IV vancomycin.  Plan of care consulted for pain management. Assessment & Plan   Abscess in epidural space of lumbar spine associated with discitis and osteomyelitis of L1-L2/myositis and bilateral psoas muscles, progressive abscess in left psoas muscle -Previously MRSE -Interventional radiology consulted and appreciated-wanted infectious disease and neurosurgery input -Neurosurgery consulted and appreciated, advised that repeat needle biopsy of the elbow wound would be preferable to an open surgical decompression -Infectious disease consulted and appreciated, recommended drainage of the abscess and holding antibiotics pending culture, and then starting vancomycin- plan to continue through 08/12/2018 with reimaging after 4 weeks -Status post underwent CT aspiration but there was no significant drainage, only a few drops of blood aspirated from left psoas site -Blood culture showed no growth to date -Aerobic and anaerobic cultures and Gram stain showed no organisms -Case was discussed with neurosurgery, Dr. Zada Finders, felt that no surgical recommendation was warranted at this time and recommended prolonged course of antibiotics -Continue pain control, palliative care was consulted  for symptom management -PT and OT recommended SNF placement -Discussed with social work, patient and husband would like for her to return home. Discussed with husband on 07/19/2018 regarding discharge and SNF. Both patient and husband are opened to SNF placement.  Diabetes mellitus, type II -Continue Lantus, insulin sliding scale with CBG monitoring  Essential hypertension -Continue metoprolol  Graves' disease -Continue PTU  Fibromyalgia/depression/restless leg syndrome -Continue aripiprazole, fluoxetine, clonazepam, ropinirole  Chronic pain syndrome -Continue gabapentin, flexeril -toradol added  -Palliative care consulted and appreciated for pain control -Continue bowel regimen  Hyperlipidemia -Continue fenofibrate  Normocytic anemia -Anemia panel showed iron of 27, U IBC 247, TIBC 274, saturation ratio of 10%, ferritin 28, folate 15.7, B12 2063 -Stable, hemoglobin currently 9.2, baseline appears between 8 and 9  Hypokalemia -Resolved with replacement, continue to monitor BMP  Hyponatremia -Resolved, continue to monitor BMP  Oral candidiasis/thrush -Appears to be improving -Nurse had reported white patches on patient's tongue -Patient was given IV fluconazole and was placed on 200 orally thereafter- will discontinue after today's dose  Obesity, morbid -BMI of 37.79 -Patient will need to follow-up with her PCP regarding lifestyle modifications  DVT Prophylaxis  Heparin  Code Status: Full  Family Communication: None at bedside.   Disposition Plan: Admitted. Dispo pending, likely SNF pending better control of pain.   Consultants Infectious disease Interventional radiology Neurosurgery  Procedures  None  Antibiotics   Anti-infectives (From admission, onward)   Start     Dose/Rate Route Frequency Ordered Stop   07/17/18 1600  vancomycin (VANCOCIN) 1,750 mg in sodium chloride 0.9 % 500 mL IVPB     1,750 mg 250 mL/hr over 120 Minutes Intravenous Every 24  hours 07/17/18 1541     07/17/18  1300  fluconazole (DIFLUCAN) tablet 200 mg     200 mg Oral Every 24 hours 07/16/18 1338     07/15/18 1400  fluconazole (DIFLUCAN) IVPB 200 mg  Status:  Discontinued     200 mg 100 mL/hr over 60 Minutes Intravenous Every 24 hours 07/14/18 1322 07/16/18 1338   07/14/18 1400  vancomycin (VANCOCIN) 1,000 mg in sodium chloride 0.9 % 250 mL IVPB  Status:  Discontinued     1,000 mg 250 mL/hr over 60 Minutes Intravenous Every 24 hours 07/13/18 1304 07/14/18 0752   07/14/18 1400  vancomycin (VANCOCIN) 1,250 mg in sodium chloride 0.9 % 250 mL IVPB  Status:  Discontinued     1,250 mg 166.7 mL/hr over 90 Minutes Intravenous Every 24 hours 07/14/18 0752 07/17/18 1541   07/14/18 1400  fluconazole (DIFLUCAN) IVPB 200 mg     200 mg 100 mL/hr over 60 Minutes Intravenous  Once 07/14/18 1303 07/14/18 1549   07/13/18 1345  vancomycin (VANCOCIN) 2,000 mg in sodium chloride 0.9 % 500 mL IVPB     2,000 mg 250 mL/hr over 120 Minutes Intravenous  Once 07/13/18 1301 07/13/18 1615      Subjective:   [name] seen and examined today.  Continues to complain of back pain today.  Feels that the Toradol has been helping.  Denies current chest pain, shortness of breath, abdominal pain, nausea or vomiting, headache or dizziness.  States that she feels more open to going to nursing facility for therapy.  Objective:   Vitals:   07/19/18 1404 07/19/18 2128 07/19/18 2301 07/20/18 0552  BP: 132/64 (!) 122/54  126/70  Pulse: 81 86 87 86  Resp: 18 16 16 16   Temp: 98 F (36.7 C) 98.4 F (36.9 C)  97.7 F (36.5 C)  TempSrc: Oral Oral  Oral  SpO2: 97% 95% 96% 96%  Weight:      Height:        Intake/Output Summary (Last 24 hours) at 07/20/2018 0931 Last data filed at 07/20/2018 0800 Gross per 24 hour  Intake 1880 ml  Output 1950 ml  Net -70 ml   Filed Weights   07/11/18 1218  Weight: 90.7 kg   Exam  General: Well developed, morbidly obese, chronically ill-appearing, NAD   HEENT: NCAT,mucous membranes moist.   Cardiovascular: S1 S2 auscultated, RRR (heart sounds distant)  Respiratory: Clear to auscultation bilaterally (anteriorly)  Abdomen: Soft, nontender, nondistended, + bowel sounds  Extremities: warm dry without cyanosis clubbing  Neuro: AAOx3, nonfocal  Psych: Appropriate mood and affect  Data Reviewed: I have personally reviewed following labs and imaging studies  CBC: Recent Labs  Lab 07/15/18 0412 07/16/18 1437 07/17/18 0339 07/18/18 0631 07/19/18 0409 07/20/18 0428  WBC 3.4* 2.8* 3.2* 2.4* 2.6* 2.5*  NEUTROABS 2.3 1.6* 2.0 1.5* 1.6*  --   HGB 9.6* 9.4* 9.1* 9.9* 9.2* 8.9*  HCT 32.1* 31.5* 30.3* 33.2* 30.1* 29.3*  MCV 79.1* 79.9* 79.5* 80.2 80.1 80.5  PLT 244 247 240 236 262 366   Basic Metabolic Panel: Recent Labs  Lab 07/15/18 0412 07/16/18 1038 07/17/18 0339 07/18/18 0631 07/19/18 0409 07/20/18 0428  NA 137 134* 136 136 137 137  K 3.6 3.7 3.7 3.6 3.7 3.8  CL 102 100 101 100 102 101  CO2 26 26 26 25 28 28   GLUCOSE 122* 111* 108* 146* 136* 133*  BUN 14 12 11 9 12 15   CREATININE 0.68 0.73 0.73 0.72 0.75 0.84  CALCIUM 9.6 9.2 9.5 9.8 9.3 9.5  MG 1.8 1.7 1.7 1.9 1.7 1.8  PHOS 3.2 2.8 3.2 3.7 3.2  --    GFR: Estimated Creatinine Clearance: 67.6 mL/min (by C-G formula based on SCr of 0.84 mg/dL). Liver Function Tests: Recent Labs  Lab 07/15/18 0412 07/16/18 1038 07/17/18 0339 07/18/18 0631 07/19/18 0409  AST 19 27 26  35 39  ALT 18 21 21 25 28   ALKPHOS 106 100 99 105 97  BILITOT 0.5 0.4 0.5 0.8 0.5  PROT 7.3 7.1 6.9 7.5 6.8  ALBUMIN 3.3* 3.3* 3.2* 3.4* 3.1*   No results for input(s): LIPASE, AMYLASE in the last 168 hours. No results for input(s): AMMONIA in the last 168 hours. Coagulation Profile: No results for input(s): INR, PROTIME in the last 168 hours. Cardiac Enzymes: No results for input(s): CKTOTAL, CKMB, CKMBINDEX, TROPONINI in the last 168 hours. BNP (last 3 results) No results for input(s):  PROBNP in the last 8760 hours. HbA1C: No results for input(s): HGBA1C in the last 72 hours. CBG: Recent Labs  Lab 07/19/18 0752 07/19/18 1148 07/19/18 1627 07/19/18 2129 07/20/18 0758  GLUCAP 109* 166* 159* 197* 100*   Lipid Profile: No results for input(s): CHOL, HDL, LDLCALC, TRIG, CHOLHDL, LDLDIRECT in the last 72 hours. Thyroid Function Tests: No results for input(s): TSH, T4TOTAL, FREET4, T3FREE, THYROIDAB in the last 72 hours. Anemia Panel: No results for input(s): VITAMINB12, FOLATE, FERRITIN, TIBC, IRON, RETICCTPCT in the last 72 hours. Urine analysis:    Component Value Date/Time   COLORURINE YELLOW 07/14/2018 0914   APPEARANCEUR CLEAR 07/14/2018 0914   LABSPEC 1.013 07/14/2018 0914   PHURINE 6.0 07/14/2018 0914   GLUCOSEU NEGATIVE 07/14/2018 0914   HGBUR NEGATIVE 07/14/2018 0914   BILIRUBINUR NEGATIVE 07/14/2018 0914   KETONESUR 5 (A) 07/14/2018 0914   PROTEINUR NEGATIVE 07/14/2018 0914   UROBILINOGEN 0.2 07/20/2014 1707   NITRITE NEGATIVE 07/14/2018 0914   LEUKOCYTESUR NEGATIVE 07/14/2018 0914   Sepsis Labs: @LABRCNTIP (procalcitonin:4,lacticidven:4)  ) Recent Results (from the past 240 hour(s))  Culture, blood (routine x 2)     Status: None   Collection Time: 07/11/18  4:52 PM  Result Value Ref Range Status   Specimen Description   Final    BLOOD LEFT ARM Performed at [facility], [place] [address], [place], [place] [address]    Special Requests   Final    BOTTLES DRAWN AEROBIC AND ANAEROBIC Blood Culture adequate volume   Culture   Final    NO GROWTH 5 DAYS Performed at [facility], [place] [address], [place], [place] [address]    Report Status 07/16/2018 FINAL  Final  Culture, blood (routine x 2)     Status: None   Collection Time: 07/11/18  4:58 PM  Result Value Ref Range Status   Specimen Description   Final    BLOOD LEFT ARM Performed at [place] [address], [place], [place] [address]     Special Requests   Final    BOTTLES DRAWN AEROBIC AND ANAEROBIC Blood Culture adequate volume   Culture   Final    NO GROWTH 5 DAYS Performed at [facility], [place] [address] [place], [place] [address]    Report Status 07/16/2018 FINAL  Final  Aerobic/Anaerobic Culture (surgical/deep wound)     Status: None   Collection Time: 07/13/18 11:12 AM  Result Value Ref Range Status   Specimen Description   Final    ABSCESS LT PSOAS Performed at [facility], [place]  [address], [place], [place] [address]    Special Requests   Final    NONE Performed at [facility], [place] [address], [place], [place] [address]    Gram Stain NO WBC SEEN NO ORGANISMS SEEN   Final   Culture   Final    No growth aerobically or anaerobically. Performed at [facility], [place] [address] [place], [place] [address]    Report Status 07/18/2018 FINAL  Final      Radiology Studies: No results found.   Scheduled Meds: . ARIPiprazole  5 mg Oral Daily  . clonazePAM  0.5 mg Oral QHS  . feeding supplement (ENSURE ENLIVE)  237 mL Oral BID BM  . fenofibrate  54 mg Oral Q supper  . fentaNYL  1 patch Transdermal Q72H  . fluconazole  200 mg Oral Q24H  . FLUoxetine  60 mg Oral QHS  . gabapentin  200 mg Oral 3 times per day  . heparin injection (subcutaneous)  5,000 Units Subcutaneous Q8H  . insulin aspart  0-5 Units Subcutaneous QHS  . insulin aspart  0-9 Units Subcutaneous TID WC  . insulin glargine  20 Units Subcutaneous BID  . metoprolol tartrate  12.5 mg Oral TID  . nystatin  5 mL Oral QID  . pantoprazole  40 mg Oral Daily  . propylthiouracil  75 mg Oral Daily  . rOPINIRole  3 mg Oral QHS  . senna  2 tablet Oral QHS  . sodium chloride flush  3 mL Intravenous Q12H   Continuous Infusions: . sodium chloride    . vancomycin Stopped (07/19/18 2014)     LOS: 9 days   Time Spent in minutes   30 minutes   Giulio Bertino D.O. on 07/20/2018 at 9:31 AM  Between  7am to 7pm - Please see pager noted on [email or web address]  After 7pm go to [email or web address]  And look for the night coverage person covering for me after hours  [facility] Office  [phone]

## 2018-07-21 ENCOUNTER — Other Ambulatory Visit: Payer: Self-pay | Admitting: Neurological Surgery

## 2018-07-21 LAB — BASIC METABOLIC PANEL
Anion gap: 9 (ref 5–15)
BUN: 19 mg/dL (ref 8–23)
CO2: 27 mmol/L (ref 22–32)
Calcium: 9.4 mg/dL (ref 8.9–10.3)
Chloride: 102 mmol/L (ref 98–111)
Creatinine, Ser: 0.96 mg/dL (ref 0.44–1.00)
GFR calc Af Amer: >60 mL/min (ref >60)
GFR calc non Af Amer: >60 mL/min (ref >60)
Glucose, Bld: 125 mg/dL — ABNORMAL HIGH (ref 70–99)
Potassium: 4.2 mmol/L (ref 3.5–5.1)
Sodium: 138 mmol/L (ref 135–145)

## 2018-07-21 LAB — CBC
HCT: 30 % — ABNORMAL LOW (ref 36.0–46.0)
Hemoglobin: 8.7 g/dL — ABNORMAL LOW (ref 12.0–15.0)
MCH: 23.6 pg — ABNORMAL LOW (ref 26.0–34.0)
MCHC: 29 g/dL — ABNORMAL LOW (ref 30.0–36.0)
MCV: 81.5 fL (ref 80.0–100.0)
Platelets: 243 10*3/uL (ref 150–400)
RBC: 3.68 MIL/uL — ABNORMAL LOW (ref 3.87–5.11)
RDW: 16.2 % — ABNORMAL HIGH (ref 11.5–15.5)
WBC: 2.4 10*3/uL — ABNORMAL LOW (ref 4.0–10.5)
nRBC: 0 % (ref 0.0–0.2)

## 2018-07-21 LAB — GLUCOSE, CAPILLARY
Glucose-Capillary: 90 mg/dL (ref 70–99)
Glucose-Capillary: 126 mg/dL — ABNORMAL HIGH (ref 70–99)
Glucose-Capillary: 117 mg/dL — ABNORMAL HIGH (ref 70–99)
Glucose-Capillary: 178 mg/dL — ABNORMAL HIGH (ref 70–99)

## 2018-07-21 LAB — VANCOMYCIN, TROUGH: Vancomycin Tr: 20 ug/mL (ref 15–20)

## 2018-07-21 MED ORDER — VANCOMYCIN HCL 10 G IV SOLR
1500.0000 mg | INTRAVENOUS | Status: DC
  Administered 2018-07-22 – 2018-07-27 (×6): 1500 mg via INTRAVENOUS
  Filled 2018-07-21 (×9): qty 1500

## 2018-07-21 NOTE — Progress Notes (Signed)
Patient ID: [name], female   DOB: 1951-01-12, 68 y.o.   MRN: [id number] Had 40 minute discussion with the patient and the husband by phone,regarding the patients situation and the destructive lesion at L!-2 with recent CT imaging. Sent a sagital photo of CT to husband for visual reference and shared the CT images with [name]. Explained the nature of the destructive process and what surgery would do to re establish some stability and provide decompression of the spinal canal.  She notes today that her pain seems better controlled and she was even able to take a few steps in the room. This is certainly encouraging.  Main reason for considering surgery is for pain control, to allow better and more vigorous mobilization and to prevent neurologic deterioration. Her leg strength, if sufficient to allow her to bear weight is good, then the main reason to consider surgery is for better pain control to allow more vigorous mobilization.   I noted that the surgery is not without risk especially considering her level of debilitation. For the time being, we will continue to observe her functional status and pain levels. I have tentatively scheduled surgery for next Friday May8. If she is showing further and definitive signs of improvement, then surgery can be postponed. If not then patient should be transferred to [facility] next Thursday in preparation  For surgery Friday

## 2018-07-21 NOTE — Plan of Care (Signed)
Patient will be discharging to SNF.

## 2018-07-21 NOTE — Progress Notes (Signed)
Entered room to place pt. On CPAP and she states with will be several hours before she is ready to go on machine and she just might not wear tonight.  Spoke with RN who states that she will assist patient when ready to go on machine or she can call me for assistance.

## 2018-07-21 NOTE — Progress Notes (Signed)
Pharmacy Antibiotic Note  [name] is a 68 y.o. female with lumbar discitis caused by methicillin resistant CoNS in March 2020 and was on vancomycin PTA. Vancomycin course stated 06/03/18 with a planned 6 week duration, expected to end on 07/16/18. Most recent regimen was vancomycin 1000 mg IV q24h. Last dose of vancomycin PTA was 07/10/18, antibiotics were held on admission and resumed on 4/23.  With limited drainage from CT aspiration, ID recommends treating for another 4 weeks with abx (treat thru 5/23).  Today, 07/21/2018:  Vanc trough = 20 (drawn appropriately)  SCr rising slightly   Plan:  Reduce vancomycin to 1500 mg IV q24 hr with SCr rising and several more weeks of vancomycin planned  Recheck vanc trough as appropriate  fluconazole 200 mg daily  monitor renal function  ____________________________________________  Height: 5\' 1"  (154.9 cm) Weight: 200 lb (90.7 kg) IBW/kg (Calculated) : 47.8  Temp (24hrs), Avg:98.4 F (36.9 C), Min:98.1 F (36.7 C), Max:98.7 F (37.1 C)  Recent Labs  Lab 07/17/18 0339 07/17/18 1345 07/18/18 0631 07/19/18 0409 07/20/18 0428 07/21/18 0310 07/21/18 1726  WBC 3.2*  --  2.4* 2.6* 2.5* 2.4*  --   CREATININE 0.73  --  0.72 0.75 0.84 0.96  --   VANCOTROUGH  --  10*  --   --   --   --  20    Estimated Creatinine Clearance: 59.2 mL/min (by C-G formula based on SCr of 0.96 mg/dL).    Allergies  Allergen Reactions  . Imitrex [Sumatriptan] Other (See Comments)    Vascular spasms  . Nsaids Other (See Comments)    PT UNABLE TO TOLERATE NSAID'S DRUGS DUE TO HX OF GASTRIC SLEEVE SURGERY  . Codeine Nausea Only  . Promethazine Hcl Other (See Comments)    Restless leg feeling all over body  . Rosuvastatin Calcium Other (See Comments)    Leg/muscle pain  . Statins Other (See Comments)    Leg/muscle pain  . Sulfonamide Derivatives Hives    Childhood allergy   . Vicodin [Hydrocodone-Acetaminophen] Nausea And Vomiting     Antimicrobials this admission: Prior to admission: Vancomycin 06/03/18 > 07/10/18 (planned stop 4/26) --> but now plan to cont with another prolonged course of abx  Vancomycin 4/23 >> Fluconazole 4/24 ( oropharyngeal candidiasis) >>  Dose adjustment: 4/27 at 1345 VT= 10 --> changed to 1750 mg IV q24h 5/1 VT = 20 (with SCr 0.96); reduce to 1500 q24  Microbiology results: 4/21 BCx: neg FINAL 4/23 abscess, L psoas:  neg FINAL  Previous recent cultures: 3/17 Urine: >100K pseudomonas, proteus 3/14 aspiration of L1,L2 disc space: abundant staphylococcus epidermidis (oxacillin resistant)  Thank you for allowing pharmacy to be a part of this patient's care.  Koya Hunger A 07/21/2018 7:21 PM

## 2018-07-21 NOTE — Progress Notes (Signed)
Physical Therapy Treatment Patient Details Name: [name] MRN: [id number] DOB: [birth year] Today's Date: 07/21/2018    History of Present Illness [name] is a 68 y.o. female with history of diabetes mellitus type 2, sleep apnea, hypertension, Graves' disease, chronic and disease, anemia. Received epidural injection 05/23/2018. She is currently being treated for a lumbar discitis.  Pt admitted with continued back pain and found to have progression of abscess in epidural space of lumbar spine associated with Discitis and Osteomyelitis of L1-L2, and Progressive Abscess in Left Psoas Muscle    PT Comments    Pt reports pain a little worse today compared to yesterday however very agreeable to attempt ambulating as tolerated.  Pt ambulated 14 feet with RW however limited by pain, weakness, and fatigue.   Follow Up Recommendations  SNF;Supervision/Assistance - 24 hour     Equipment Recommendations  None recommended by PT    Recommendations for Other Services       Precautions / Restrictions Precautions Precautions: Fall;Back Restrictions Weight Bearing Restrictions: No    Mobility  Bed Mobility               General bed mobility comments: Pt up in recliner on arrival  Transfers Overall transfer level: Needs assistance Equipment used: Rolling walker (2 wheeled) Transfers: Sit to/from Stand Sit to Stand: Min assist         General transfer comment: assist to rise and steady, cues for use of legs and hand placement  Ambulation/Gait Ambulation/Gait assistance: Min assist Gait Distance (Feet): 14 Feet Assistive device: Rolling walker (2 wheeled) Gait Pattern/deviations: Step-through pattern;Decreased stride length;Narrow base of support     General Gait Details: pt tends to perform narrow BOS with increased toe out, cues for neutral foot positioning, assist for steadying, distance limited by pain, fatigue and generalized weakness per pt   Stairs              Wheelchair Mobility    Modified Rankin (Stroke Patients Only)       Balance Overall balance assessment: Needs assistance         Standing balance support: Bilateral upper extremity supported;During functional activity Standing balance-Leahy Scale: Poor                              Cognition Arousal/Alertness: Awake/alert Behavior During Therapy: WFL for tasks assessed/performed Overall Cognitive Status: Within Functional Limits for tasks assessed                                        Exercises      General Comments        Pertinent Vitals/Pain Pain Assessment: 0-10 Pain Score: 6  Pain Location: lower back Pain Descriptors / Indicators: Radiating;Sharp Pain Intervention(s): Repositioned;Monitored during session;Limited activity within patient's tolerance;Premedicated before session    Home Living                      Prior Function            PT Goals (current goals can now be found in the care plan section) Progress towards PT goals: Progressing toward goals    Frequency    Min 3X/week      PT Plan Current plan remains appropriate    Co-evaluation  AM-PAC PT "6 Clicks" Mobility   Outcome Measure  Help needed turning from your back to your side while in a flat bed without using bedrails?: A Lot Help needed moving from lying on your back to sitting on the side of a flat bed without using bedrails?: A Lot Help needed moving to and from a bed to a chair (including a wheelchair)?: A Little Help needed standing up from a chair using your arms (e.g., wheelchair or bedside chair)?: A Little Help needed to walk in hospital room?: A Little Help needed climbing 3-5 steps with a railing? : A Lot 6 Click Score: 15    End of Session Equipment Utilized During Treatment: Gait belt Activity Tolerance: Patient tolerated treatment well Patient left: in chair;with call bell/phone within reach;with chair  alarm set Nurse Communication: Mobility status PT Visit Diagnosis: Other abnormalities of gait and mobility (R26.89)     Time: 9485-4627 PT Time Calculation (min) (ACUTE ONLY): 11 min  Charges:  $Gait Training: 8-22 mins                     Carmelia Bake, PT, DPT Acute Rehabilitation Services Office: [phone] Pager: [place] 07/21/2018, 12:23 PM

## 2018-07-21 NOTE — Care Management Important Message (Signed)
Important Message  Patient Details IM Letter given to Velva Harman Case Manager to present to the Case Manager Name: [name] MRN: [id number] Date of Birth: Aug 10, 1950   Medicare Important Message Given:  Yes    [name] 07/21/2018, 10:08 AM

## 2018-07-21 NOTE — Progress Notes (Signed)
PROGRESS NOTE    [name]  YIR:[id number] DOB: 19-Oct-1950 DOA: 07/11/2018 PCP: Crist Infante, MD   Brief Narrative:  HPI on 07/11/2018 by Dr. Debbe Odea [name] is a 68 y.o. female with medical history of DM 2 who presents with lower back pain. She is currently being treated for a lumbar discitis with Methicillin resistant coag neg staph with Vancomycin at home. She developed increasing back pain about 1 wk ago and noted a fever of about 100 degrees. She has had worsening of her pain with it radiating to her left buttock. She has no other complaints.   Interim history Admitted with abscess in the epidural space of lumbar spine associated with discitis and osteomyelitis.  Currently will require 4 weeks of IV vancomycin.  Plan of care consulted for pain management. Assessment & Plan   Abscess in epidural space of lumbar spine associated with discitis and osteomyelitis of L1-L2/myositis and bilateral psoas muscles, progressive abscess in left psoas muscle -Previously MRSE -Interventional radiology consulted and appreciated-wanted infectious disease and neurosurgery input -Neurosurgery consulted and appreciated, advised that repeat needle biopsy of the elbow wound would be preferable to an open surgical decompression -Infectious disease consulted and appreciated, recommended drainage of the abscess and holding antibiotics pending culture, and then starting vancomycin- plan to continue through 08/12/2018 with reimaging after 4 weeks -Status post underwent CT aspiration but there was no significant drainage, only a few drops of blood aspirated from left psoas site -Blood culture showed no growth to date -Aerobic and anaerobic cultures and Gram stain showed no organisms -Case was discussed with neurosurgery, Dr. Zada Finders, felt that no surgical recommendation was warranted at this time and recommended prolonged course of antibiotics -Continue pain control, palliative care was consulted  for symptom management -PT and OT recommended SNF placement -Discussed with social work, patient and husband would like for her to return home. Discussed with husband on 07/19/2018 regarding discharge and SNF. Both patient and husband are opened to SNF placement. -Discussed with neurosurgery, Dr. Ellene Route, obtained CT scan. He will discuss with the patient and husband possibility of surgery next week to help stabilize spine and clean out.   Diabetes mellitus, type II -Continue Lantus, insulin sliding scale with CBG monitoring  Essential hypertension -Continue metoprolol  Graves' disease -Continue PTU  Fibromyalgia/depression/restless leg syndrome -Continue aripiprazole, fluoxetine, clonazepam, ropinirole  Chronic pain syndrome -Continue gabapentin, flexeril, toradol, fentanyl patch -Palliative care consulted and appreciated for pain control -Continue bowel regimen  Hyperlipidemia -Continue fenofibrate  Normocytic anemia -Anemia panel showed iron of 27, U IBC 247, TIBC 274, saturation ratio of 10%, ferritin 28, folate 15.7, B12 2063 -Stable, hemoglobin currently 8.7, baseline appears between 8 and 9  Hypokalemia -Resolved with replacement, continue to monitor BMP  Hyponatremia -Resolved, continue to monitor BMP  Oral candidiasis/thrush -Appears to be improving -Nurse had reported white patches on patient's tongue -Completed course of fluconazole  Obesity, morbid -BMI of 37.79 -Patient will need to follow-up with her PCP regarding lifestyle modifications  DVT Prophylaxis  Heparin  Code Status: Full  Family Communication: None at bedside. Husband via phone.  Disposition Plan: Admitted. Dispo pending possible surgery. Likely SNF.  Consultants Infectious disease Interventional radiology Neurosurgery  Procedures  None  Antibiotics   Anti-infectives (From admission, onward)   Start     Dose/Rate Route Frequency Ordered Stop   07/17/18 1600  vancomycin (VANCOCIN)  1,750 mg in sodium chloride 0.9 % 500 mL IVPB     1,750 mg 250 mL/hr  over 120 Minutes Intravenous Every 24 hours 07/17/18 1541     07/17/18 1300  fluconazole (DIFLUCAN) tablet 200 mg     200 mg Oral Every 24 hours 07/16/18 1338     07/15/18 1400  fluconazole (DIFLUCAN) IVPB 200 mg  Status:  Discontinued     200 mg 100 mL/hr over 60 Minutes Intravenous Every 24 hours 07/14/18 1322 07/16/18 1338   07/14/18 1400  vancomycin (VANCOCIN) 1,000 mg in sodium chloride 0.9 % 250 mL IVPB  Status:  Discontinued     1,000 mg 250 mL/hr over 60 Minutes Intravenous Every 24 hours 07/13/18 1304 07/14/18 0752   07/14/18 1400  vancomycin (VANCOCIN) 1,250 mg in sodium chloride 0.9 % 250 mL IVPB  Status:  Discontinued     1,250 mg 166.7 mL/hr over 90 Minutes Intravenous Every 24 hours 07/14/18 0752 07/17/18 1541   07/14/18 1400  fluconazole (DIFLUCAN) IVPB 200 mg     200 mg 100 mL/hr over 60 Minutes Intravenous  Once 07/14/18 1303 07/14/18 1549   07/13/18 1345  vancomycin (VANCOCIN) 2,000 mg in sodium chloride 0.9 % 500 mL IVPB     2,000 mg 250 mL/hr over 120 Minutes Intravenous  Once 07/13/18 1301 07/13/18 1615      Subjective:   [name] seen and examined today.  Continues to have back pain but feels that pain medications are helping some.  Unable to move very much.  Denies chest pain, shortness of breath, abdominal pain, nausea or vomiting, headache or dizziness.  Objective:   Vitals:   07/20/18 0552 07/20/18 1402 07/20/18 2029 07/21/18 0547  BP: 126/70 (!) 124/49 (!) 136/51 134/70  Pulse: 86 85 95 96  Resp: 16 16 20 20   Temp: 97.7 F (36.5 C) 97.9 F (36.6 C) 98.1 F (36.7 C) 98.7 F (37.1 C)  TempSrc: Oral Oral Oral Oral  SpO2: 96% 100% 95% 97%  Weight:      Height:        Intake/Output Summary (Last 24 hours) at 07/21/2018 1029 Last data filed at 07/21/2018 1017 Gross per 24 hour  Intake 1470.11 ml  Output 1050 ml  Net 420.11 ml   Filed Weights   07/11/18 1218  Weight: 90.7 kg    Exam  General: Well developed, morbidly obese, chronically ill-appearing, NAD  HEENT: NCAT, PERRLA, EOMI, Anicteic Sclera, mucous membranes moist.   Neck: Supple, no JVD, no masses  Cardiovascular: S1 S2 auscultated, distant heart sounds, RRR  Respiratory: Clear to auscultation bilaterally (anteriorly)  Abdomen: Soft, obese, nontender, nondistended, + bowel sounds  Extremities: warm dry without cyanosis clubbing or edema  Neuro: AAOx3, nonfocal  Psych: Appropriate mood and affect   Data Reviewed: I have personally reviewed following labs and imaging studies  CBC: Recent Labs  Lab 07/15/18 0412 07/16/18 1437 07/17/18 0339 07/18/18 0631 07/19/18 0409 07/20/18 0428 07/21/18 0310  WBC 3.4* 2.8* 3.2* 2.4* 2.6* 2.5* 2.4*  NEUTROABS 2.3 1.6* 2.0 1.5* 1.6*  --   --   HGB 9.6* 9.4* 9.1* 9.9* 9.2* 8.9* 8.7*  HCT 32.1* 31.5* 30.3* 33.2* 30.1* 29.3* 30.0*  MCV 79.1* 79.9* 79.5* 80.2 80.1 80.5 81.5  PLT 244 247 240 236 262 238 875   Basic Metabolic Panel: Recent Labs  Lab 07/15/18 0412 07/16/18 1038 07/17/18 0339 07/18/18 0631 07/19/18 0409 07/20/18 0428 07/21/18 0310  NA 137 134* 136 136 137 137 138  K 3.6 3.7 3.7 3.6 3.7 3.8 4.2  CL 102 100 101 100 102 101 102  CO2  26 26 26 25 28 28 27   GLUCOSE 122* 111* 108* 146* 136* 133* 125*  BUN 14 12 11 9 12 15 19   CREATININE 0.68 0.73 0.73 0.72 0.75 0.84 0.96  CALCIUM 9.6 9.2 9.5 9.8 9.3 9.5 9.4  MG 1.8 1.7 1.7 1.9 1.7 1.8  --   PHOS 3.2 2.8 3.2 3.7 3.2  --   --    GFR: Estimated Creatinine Clearance: 59.2 mL/min (by C-G formula based on SCr of 0.96 mg/dL). Liver Function Tests: Recent Labs  Lab 07/15/18 0412 07/16/18 1038 07/17/18 0339 07/18/18 0631 07/19/18 0409  AST 19 27 26  35 39  ALT 18 21 21 25 28   ALKPHOS 106 100 99 105 97  BILITOT 0.5 0.4 0.5 0.8 0.5  PROT 7.3 7.1 6.9 7.5 6.8  ALBUMIN 3.3* 3.3* 3.2* 3.4* 3.1*   No results for input(s): LIPASE, AMYLASE in the last 168 hours. No results for input(s):  AMMONIA in the last 168 hours. Coagulation Profile: No results for input(s): INR, PROTIME in the last 168 hours. Cardiac Enzymes: No results for input(s): CKTOTAL, CKMB, CKMBINDEX, TROPONINI in the last 168 hours. BNP (last 3 results) No results for input(s): PROBNP in the last 8760 hours. HbA1C: No results for input(s): HGBA1C in the last 72 hours. CBG: Recent Labs  Lab 07/20/18 0758 07/20/18 1126 07/20/18 1629 07/20/18 2032 07/21/18 0731  GLUCAP 100* 163* 157* 162* 90   Lipid Profile: No results for input(s): CHOL, HDL, LDLCALC, TRIG, CHOLHDL, LDLDIRECT in the last 72 hours. Thyroid Function Tests: No results for input(s): TSH, T4TOTAL, FREET4, T3FREE, THYROIDAB in the last 72 hours. Anemia Panel: No results for input(s): VITAMINB12, FOLATE, FERRITIN, TIBC, IRON, RETICCTPCT in the last 72 hours. Urine analysis:    Component Value Date/Time   COLORURINE YELLOW 07/14/2018 0914   APPEARANCEUR CLEAR 07/14/2018 0914   LABSPEC 1.013 07/14/2018 0914   PHURINE 6.0 07/14/2018 0914   GLUCOSEU NEGATIVE 07/14/2018 0914   HGBUR NEGATIVE 07/14/2018 0914   BILIRUBINUR NEGATIVE 07/14/2018 0914   KETONESUR 5 (A) 07/14/2018 0914   PROTEINUR NEGATIVE 07/14/2018 0914   UROBILINOGEN 0.2 07/20/2014 1707   NITRITE NEGATIVE 07/14/2018 0914   LEUKOCYTESUR NEGATIVE 07/14/2018 0914   Sepsis Labs: @LABRCNTIP (procalcitonin:4,lacticidven:4)  ) Recent Results (from the past 240 hour(s))  Culture, blood (routine x 2)     Status: None   Collection Time: 07/11/18  4:52 PM  Result Value Ref Range Status   Specimen Description   Final    BLOOD LEFT ARM Performed at [facility], [place] [address], [place], [place] [address]    Special Requests   Final    BOTTLES DRAWN AEROBIC AND ANAEROBIC Blood Culture adequate volume   Culture   Final    NO GROWTH 5 DAYS Performed at [facility], [place] [address] [place], [place] [address]    Report Status 07/16/2018 FINAL  Final   Culture, blood (routine x 2)     Status: None   Collection Time: 07/11/18  4:58 PM  Result Value Ref Range Status   Specimen Description   Final    BLOOD LEFT ARM Performed at [place] [address], [place], [place] [address]    Special Requests   Final    BOTTLES DRAWN AEROBIC AND ANAEROBIC Blood Culture adequate volume   Culture   Final    NO GROWTH 5 DAYS Performed at [facility], [place] [address], [place], [place] [address]    Report  Status 07/16/2018 FINAL  Final  Aerobic/Anaerobic Culture (surgical/deep wound)     Status: None   Collection Time: 07/13/18 11:12 AM  Result Value Ref Range Status   Specimen Description   Final    ABSCESS LT PSOAS Performed at [facility], [place] [address], [place], [place] [address]    Special Requests   Final    NONE Performed at [facility], [place] [address] [place], [place] [address]    Gram Stain NO WBC SEEN NO ORGANISMS SEEN   Final   Culture   Final    No growth aerobically or anaerobically. Performed at [facility], [place] [address], [place], [place] [address]    Report Status 07/18/2018 FINAL  Final      Radiology Studies: Ct Lumbar Spine Wo Contrast  Result Date: 07/20/2018 CLINICAL DATA:  Discitis-osteomyelitis, MRSA, with worsening low back pain EXAM: CT LUMBAR SPINE WITHOUT CONTRAST TECHNIQUE: Multidetector CT imaging of the lumbar spine was performed without intravenous contrast administration. Multiplanar CT image reconstructions were also generated. COMPARISON:  CT lumbar spine 05/31/2018 MRI lumbar spine 06/02/2018, 07/11/2018. FINDINGS: Segmentation: 5 lumbar type vertebrae. Alignment: Unchanged grade 1 anterolisthesis at L5-S1. Vertebrae: There is severe endplate erosion at T6-R4, markedly progressed from the prior CT 05/31/2018. Comparison to the MRI of 07/11/2018 is limited by the difference in modality, but the degree of osseous destruction is  approximately unchanged. Paraspinal and other soft tissues: There is paravertebral phlegmon at the L1-L2 levels with a soft tissue component extending into the right ventral epidural space (4:23). Disc levels: At L1-2, there is moderate attenuation of the thecal sac due to compression by ventral epidural collection. There is severe narrowing of both neural foramina, unchanged. There is severe L4-5 and L5-S1 facet arthrosis. IMPRESSION: 1. Severe L1-2 discitis-osteomyelitis with unchanged degree of endplate and vertebral body destruction compared to 07/11/2018, allowing for differences in modality. 2. Right ventral epidural collection at L1-2, moderately narrowing the spinal canal and most consistent with epidural abscess, as previously demonstrated. 3. L1-2 paravertebral phlegmon with edema of the psoas muscles. No discrete fluid collection identified on this study. Electronically Signed   By: Ulyses Jarred M.D.   On: 07/20/2018 16:37     Scheduled Meds: . ARIPiprazole  5 mg Oral Daily  . clonazePAM  0.5 mg Oral QHS  . feeding supplement (ENSURE ENLIVE)  237 mL Oral BID BM  . fenofibrate  54 mg Oral Q supper  . fentaNYL  1 patch Transdermal Q72H  . fluconazole  200 mg Oral Q24H  . FLUoxetine  60 mg Oral QHS  . gabapentin  200 mg Oral 3 times per day  . heparin injection (subcutaneous)  5,000 Units Subcutaneous Q8H  . insulin aspart  0-5 Units Subcutaneous QHS  . insulin aspart  0-9 Units Subcutaneous TID WC  . insulin glargine  20 Units Subcutaneous BID  . metoprolol tartrate  12.5 mg Oral TID  . nystatin  5 mL Oral QID  . pantoprazole  40 mg Oral Daily  . propylthiouracil  75 mg Oral Daily  . rOPINIRole  3 mg Oral QHS  . senna  2 tablet Oral QHS  . sodium chloride flush  3 mL Intravenous Q12H   Continuous Infusions: . sodium chloride    . vancomycin Stopped (07/20/18 1959)     LOS: 10 days   Time Spent in minutes   45 minutes (greater than 50% of time spent with patient face to face,  as well as reviewing records, calling consultant, and formulating a plan and discussing with husband)    Cristal Ford D.O. on 07/21/2018 at 10:29 AM  Between 7am to 7pm - Please see pager noted on [email or web address]  After 7pm go to [email or web address]  And look for the night coverage person covering for me after hours  [facility] Office  [phone]

## 2018-07-22 LAB — HEMOGLOBIN AND HEMATOCRIT, BLOOD
HCT: 28.4 % — ABNORMAL LOW (ref 36.0–46.0)
Hemoglobin: 8.5 g/dL — ABNORMAL LOW (ref 12.0–15.0)

## 2018-07-22 LAB — GLUCOSE, CAPILLARY
Glucose-Capillary: 91 mg/dL (ref 70–99)
Glucose-Capillary: 148 mg/dL — ABNORMAL HIGH (ref 70–99)
Glucose-Capillary: 137 mg/dL — ABNORMAL HIGH (ref 70–99)
Glucose-Capillary: 124 mg/dL — ABNORMAL HIGH (ref 70–99)

## 2018-07-22 LAB — PREALBUMIN: Prealbumin: 12 mg/dL — ABNORMAL LOW (ref 18–38)

## 2018-07-22 NOTE — Progress Notes (Signed)
PROGRESS NOTE    [name]  ZOX:[id number] DOB: 1950/09/18 DOA: 07/11/2018 PCP: Crist Infante, MD   Brief Narrative:  HPI on 07/11/2018 by Dr. Debbe Odea [name] is a 68 y.o. female with medical history of DM 2 who presents with lower back pain. She is currently being treated for a lumbar discitis with Methicillin resistant coag neg staph with Vancomycin at home. She developed increasing back pain about 1 wk ago and noted a fever of about 100 degrees. She has had worsening of her pain with it radiating to her left buttock. She has no other complaints.   Interim history Admitted with abscess in the epidural space of lumbar spine associated with discitis and osteomyelitis.  Currently will require 4 weeks of IV vancomycin.  Plan of care consulted for pain management.  Neurosurgery currently following patient, with tentative plan for surgery next week, 07/28/18. Assessment & Plan   Abscess in epidural space of lumbar spine associated with discitis and osteomyelitis of L1-L2/myositis and bilateral psoas muscles, progressive abscess in left psoas muscle -Previously MRSE -Interventional radiology consulted and appreciated-wanted infectious disease and neurosurgery input -Neurosurgery consulted and appreciated, advised that repeat needle biopsy of the elbow wound would be preferable to an open surgical decompression -Infectious disease consulted and appreciated, recommended drainage of the abscess and holding antibiotics pending culture, and then starting vancomycin- plan to continue through 08/12/2018 with reimaging after 4 weeks -Status post underwent CT aspiration but there was no significant drainage, only a few drops of blood aspirated from left psoas site -Blood culture showed no growth to date -Aerobic and anaerobic cultures and Gram stain showed no organisms -Case was discussed with neurosurgery, Dr. Zada Finders, felt that no surgical recommendation was warranted at this time and  recommended prolonged course of antibiotics -Continue pain control, palliative care was consulted for symptom management -PT and OT recommended SNF placement -Discussed with social work, patient and husband would like for her to return home. Discussed with husband on 07/19/2018 regarding discharge and SNF. Both patient and husband are opened to SNF placement. -Discussed with neurosurgery, Dr. Ellene Route, obtained CT scan. He will discuss with the patient and husband possibility of surgery next week to help stabilize spine and clean out.  Tentative surgery 07/28/2018 which will need to occur at [facility].  Patient states she is leaning more towards moving forward with surgery as she feels this will give her a better quality of life.  Diabetes mellitus, type II -Continue Lantus, insulin sliding scale with CBG monitoring  Essential hypertension -Continue metoprolol  Graves' disease -Continue PTU  Fibromyalgia/depression/restless leg syndrome -Continue aripiprazole, fluoxetine, clonazepam, ropinirole  Chronic pain syndrome -Continue gabapentin, flexeril, toradol, fentanyl patch, as needed oxycodone as well as Dilaudid -Palliative care consulted and appreciated for pain control -Continue bowel regimen  Hyperlipidemia -Continue fenofibrate  Normocytic anemia -Anemia panel showed iron of 27, U IBC 247, TIBC 274, saturation ratio of 10%, ferritin 28, folate 15.7, B12 2063 -Stable, hemoglobin currently 8.5, baseline appears between 8 and 9  Hypokalemia -Resolved with replacement, continue to monitor BMP  Hyponatremia -Resolved, continue to monitor BMP  Oral candidiasis/thrush -Appears to be improving -Nurse had reported white patches on patient's tongue -Completed course of fluconazole  Obesity, morbid -BMI of 37.79 -Patient will need to follow-up with her PCP regarding lifestyle modifications  DVT Prophylaxis  Heparin  Code Status: Full  Family Communication: None at  bedside.  Disposition Plan: Admitted. Dispo pending possible surgery. Likely SNF.  Consultants Infectious  disease Interventional radiology Neurosurgery  Procedures  None  Antibiotics   Anti-infectives (From admission, onward)   Start     Dose/Rate Route Frequency Ordered Stop   07/22/18 1800  vancomycin (VANCOCIN) 1,500 mg in sodium chloride 0.9 % 500 mL IVPB     1,500 mg 250 mL/hr over 120 Minutes Intravenous Every 24 hours 07/21/18 1804     07/17/18 1600  vancomycin (VANCOCIN) 1,750 mg in sodium chloride 0.9 % 500 mL IVPB  Status:  Discontinued     1,750 mg 250 mL/hr over 120 Minutes Intravenous Every 24 hours 07/17/18 1541 07/21/18 1804   07/17/18 1300  fluconazole (DIFLUCAN) tablet 200 mg  Status:  Discontinued     200 mg Oral Every 24 hours 07/16/18 1338 07/21/18 1043   07/15/18 1400  fluconazole (DIFLUCAN) IVPB 200 mg  Status:  Discontinued     200 mg 100 mL/hr over 60 Minutes Intravenous Every 24 hours 07/14/18 1322 07/16/18 1338   07/14/18 1400  vancomycin (VANCOCIN) 1,000 mg in sodium chloride 0.9 % 250 mL IVPB  Status:  Discontinued     1,000 mg 250 mL/hr over 60 Minutes Intravenous Every 24 hours 07/13/18 1304 07/14/18 0752   07/14/18 1400  vancomycin (VANCOCIN) 1,250 mg in sodium chloride 0.9 % 250 mL IVPB  Status:  Discontinued     1,250 mg 166.7 mL/hr over 90 Minutes Intravenous Every 24 hours 07/14/18 0752 07/17/18 1541   07/14/18 1400  fluconazole (DIFLUCAN) IVPB 200 mg     200 mg 100 mL/hr over 60 Minutes Intravenous  Once 07/14/18 1303 07/14/18 1549   07/13/18 1345  vancomycin (VANCOCIN) 2,000 mg in sodium chloride 0.9 % 500 mL IVPB     2,000 mg 250 mL/hr over 120 Minutes Intravenous  Once 07/13/18 1301 07/13/18 1615      Subjective:   [name] seen and examined today.  Continues to have back pain.  States that she was able to get up some yesterday but felt very drained afterward.  Continues to need IV pain medications.  Denies current chest pain,  shortness of breath, abdominal pain, nausea or vomiting, diarrhea or constipation, dizziness or headache.   Objective:   Vitals:   07/21/18 0547 07/21/18 1305 07/21/18 2130 07/22/18 0524  BP: 134/70 (!) 122/53 (!) 134/48 (!) 150/60  Pulse: 96 83 92 99  Resp: 20 17 16 18   Temp: 98.7 F (37.1 C) 98.4 F (36.9 C) 99 F (37.2 C) 99 F (37.2 C)  TempSrc: Oral Oral Oral Oral  SpO2: 97% 99% 95% 92%  Weight:      Height:        Intake/Output Summary (Last 24 hours) at 07/22/2018 1056 Last data filed at 07/22/2018 4818 Gross per 24 hour  Intake 660 ml  Output 1600 ml  Net -940 ml   Filed Weights   07/11/18 1218  Weight: 90.7 kg   Exam  General: Well developed, morbidly obese, chronically ill-appearing, NAD  HEENT: NCAT, mucous membranes moist.   Cardiovascular: S1 S2 auscultated, distant heart sounds, RRR  Respiratory: Clear to auscultation bilaterally  Abdomen: Soft, obese, nontender, nondistended, + bowel sounds  Extremities: warm dry without cyanosis clubbing or edema  Neuro: AAOx3, nonfocal  Psych: Normal affect and demeanor with intact judgement and insight  Data Reviewed: I have personally reviewed following labs and imaging studies  CBC: Recent Labs  Lab 07/16/18 1437 07/17/18 0339 07/18/18 0631 07/19/18 0409 07/20/18 0428 07/21/18 0310 07/22/18 0303  WBC 2.8* 3.2* 2.4* 2.6*  2.5* 2.4*  --   NEUTROABS 1.6* 2.0 1.5* 1.6*  --   --   --   HGB 9.4* 9.1* 9.9* 9.2* 8.9* 8.7* 8.5*  HCT 31.5* 30.3* 33.2* 30.1* 29.3* 30.0* 28.4*  MCV 79.9* 79.5* 80.2 80.1 80.5 81.5  --   PLT 247 240 236 262 238 243  --    Basic Metabolic Panel: Recent Labs  Lab 07/16/18 1038 07/17/18 0339 07/18/18 0631 07/19/18 0409 07/20/18 0428 07/21/18 0310  NA 134* 136 136 137 137 138  K 3.7 3.7 3.6 3.7 3.8 4.2  CL 100 101 100 102 101 102  CO2 26 26 25 28 28 27   GLUCOSE 111* 108* 146* 136* 133* 125*  BUN 12 11 9 12 15 19   CREATININE 0.73 0.73 0.72 0.75 0.84 0.96  CALCIUM 9.2 9.5  9.8 9.3 9.5 9.4  MG 1.7 1.7 1.9 1.7 1.8  --   PHOS 2.8 3.2 3.7 3.2  --   --    GFR: Estimated Creatinine Clearance: 59.2 mL/min (by C-G formula based on SCr of 0.96 mg/dL). Liver Function Tests: Recent Labs  Lab 07/16/18 1038 07/17/18 0339 07/18/18 0631 07/19/18 0409  AST 27 26 35 39  ALT 21 21 25 28   ALKPHOS 100 99 105 97  BILITOT 0.4 0.5 0.8 0.5  PROT 7.1 6.9 7.5 6.8  ALBUMIN 3.3* 3.2* 3.4* 3.1*   No results for input(s): LIPASE, AMYLASE in the last 168 hours. No results for input(s): AMMONIA in the last 168 hours. Coagulation Profile: No results for input(s): INR, PROTIME in the last 168 hours. Cardiac Enzymes: No results for input(s): CKTOTAL, CKMB, CKMBINDEX, TROPONINI in the last 168 hours. BNP (last 3 results) No results for input(s): PROBNP in the last 8760 hours. HbA1C: No results for input(s): HGBA1C in the last 72 hours. CBG: Recent Labs  Lab 07/21/18 0731 07/21/18 1141 07/21/18 1744 07/21/18 2133 07/22/18 0743  GLUCAP 90 126* 117* 178* 91   Lipid Profile: No results for input(s): CHOL, HDL, LDLCALC, TRIG, CHOLHDL, LDLDIRECT in the last 72 hours. Thyroid Function Tests: No results for input(s): TSH, T4TOTAL, FREET4, T3FREE, THYROIDAB in the last 72 hours. Anemia Panel: No results for input(s): VITAMINB12, FOLATE, FERRITIN, TIBC, IRON, RETICCTPCT in the last 72 hours. Urine analysis:    Component Value Date/Time   COLORURINE YELLOW 07/14/2018 0914   APPEARANCEUR CLEAR 07/14/2018 0914   LABSPEC 1.013 07/14/2018 0914   PHURINE 6.0 07/14/2018 0914   GLUCOSEU NEGATIVE 07/14/2018 0914   HGBUR NEGATIVE 07/14/2018 0914   BILIRUBINUR NEGATIVE 07/14/2018 0914   KETONESUR 5 (A) 07/14/2018 0914   PROTEINUR NEGATIVE 07/14/2018 0914   UROBILINOGEN 0.2 07/20/2014 1707   NITRITE NEGATIVE 07/14/2018 0914   LEUKOCYTESUR NEGATIVE 07/14/2018 0914   Sepsis Labs: @LABRCNTIP (procalcitonin:4,lacticidven:4)  ) Recent Results (from the past 240 hour(s))    Aerobic/Anaerobic Culture (surgical/deep wound)     Status: None   Collection Time: 07/13/18 11:12 AM  Result Value Ref Range Status   Specimen Description   Final    ABSCESS LT PSOAS Performed at [facility], [place] [address], [place], [place] [address]    Special Requests   Final    NONE Performed at [facility], [place] [address] [place], [place] [address]    Gram Stain NO WBC SEEN NO ORGANISMS SEEN   Final   Culture   Final    No growth aerobically or anaerobically. Performed at [facility], [place] [address] [place], [place] [address]  Report Status 07/18/2018 FINAL  Final      Radiology Studies: Ct Lumbar Spine Wo Contrast  Result Date: 07/20/2018 CLINICAL DATA:  Discitis-osteomyelitis, MRSA, with worsening low back pain EXAM: CT LUMBAR SPINE WITHOUT CONTRAST TECHNIQUE: Multidetector CT imaging of the lumbar spine was performed without intravenous contrast administration. Multiplanar CT image reconstructions were also generated. COMPARISON:  CT lumbar spine 05/31/2018 MRI lumbar spine 06/02/2018, 07/11/2018. FINDINGS: Segmentation: 5 lumbar type vertebrae. Alignment: Unchanged grade 1 anterolisthesis at L5-S1. Vertebrae: There is severe endplate erosion at F0-Y7, markedly progressed from the prior CT 05/31/2018. Comparison to the MRI of 07/11/2018 is limited by the difference in modality, but the degree of osseous destruction is approximately unchanged. Paraspinal and other soft tissues: There is paravertebral phlegmon at the L1-L2 levels with a soft tissue component extending into the right ventral epidural space (4:23). Disc levels: At L1-2, there is moderate attenuation of the thecal sac due to compression by ventral epidural collection. There is severe narrowing of both neural foramina, unchanged. There is severe L4-5 and L5-S1 facet arthrosis. IMPRESSION: 1. Severe L1-2 discitis-osteomyelitis with unchanged degree of endplate and  vertebral body destruction compared to 07/11/2018, allowing for differences in modality. 2. Right ventral epidural collection at L1-2, moderately narrowing the spinal canal and most consistent with epidural abscess, as previously demonstrated. 3. L1-2 paravertebral phlegmon with edema of the psoas muscles. No discrete fluid collection identified on this study. Electronically Signed   By: Ulyses Jarred M.D.   On: 07/20/2018 16:37     Scheduled Meds:  ARIPiprazole  5 mg Oral Daily   clonazePAM  0.5 mg Oral QHS   feeding supplement (ENSURE ENLIVE)  237 mL Oral BID BM   fenofibrate  54 mg Oral Q supper   fentaNYL  1 patch Transdermal Q72H   FLUoxetine  60 mg Oral QHS   gabapentin  200 mg Oral 3 times per day   heparin injection (subcutaneous)  5,000 Units Subcutaneous Q8H   insulin aspart  0-5 Units Subcutaneous QHS   insulin aspart  0-9 Units Subcutaneous TID WC   insulin glargine  20 Units Subcutaneous BID   metoprolol tartrate  12.5 mg Oral TID   nystatin  5 mL Oral QID   pantoprazole  40 mg Oral Daily   propylthiouracil  75 mg Oral Daily   rOPINIRole  3 mg Oral QHS   senna  2 tablet Oral QHS   sodium chloride flush  3 mL Intravenous Q12H   Continuous Infusions:  sodium chloride     vancomycin       LOS: 11 days   Time Spent in minutes   30 minutes  Katura Eatherly D.O. on 07/22/2018 at 10:56 AM  Between 7am to 7pm - Please see pager noted on [email or web address]  After 7pm go to [email or web address]  And look for the night coverage person covering for me after hours  [facility] Office  [phone]

## 2018-07-22 NOTE — Plan of Care (Signed)
  Problem: Health Behavior/Discharge Planning: Goal: Ability to manage health-related needs will improve Outcome: Progressing   Problem: Clinical Measurements: Goal: Ability to maintain clinical measurements within normal limits will improve Outcome: Progressing Goal: Will remain free from infection Outcome: Progressing Goal: Diagnostic test results will improve Outcome: Progressing Goal: Respiratory complications will improve Outcome: Progressing Goal: Cardiovascular complication will be avoided Outcome: Progressing   Problem: Activity: Goal: Risk for activity intolerance will decrease Outcome: Progressing   Problem: Coping: Goal: Level of anxiety will decrease Outcome: Progressing   Problem: Pain Managment: Goal: General experience of comfort will improve Outcome: Progressing   Problem: Safety: Goal: Ability to remain free from injury will improve Outcome: Progressing   Problem: Skin Integrity: Goal: Risk for impaired skin integrity will decrease Outcome: Progressing  Discussed plan of care with patient

## 2018-07-23 LAB — HEMOGLOBIN AND HEMATOCRIT, BLOOD
HCT: 33.2 % — ABNORMAL LOW (ref 36.0–46.0)
Hemoglobin: 9.6 g/dL — ABNORMAL LOW (ref 12.0–15.0)

## 2018-07-23 LAB — GLUCOSE, CAPILLARY
Glucose-Capillary: 113 mg/dL — ABNORMAL HIGH (ref 70–99)
Glucose-Capillary: 139 mg/dL — ABNORMAL HIGH (ref 70–99)
Glucose-Capillary: 162 mg/dL — ABNORMAL HIGH (ref 70–99)
Glucose-Capillary: 205 mg/dL — ABNORMAL HIGH (ref 70–99)

## 2018-07-23 LAB — ALBUMIN: Albumin: 3.3 g/dL — ABNORMAL LOW (ref 3.5–5.0)

## 2018-07-23 LAB — BASIC METABOLIC PANEL
Anion gap: 10 (ref 5–15)
BUN: 13 mg/dL (ref 8–23)
CO2: 27 mmol/L (ref 22–32)
Calcium: 9.7 mg/dL (ref 8.9–10.3)
Chloride: 101 mmol/L (ref 98–111)
Creatinine, Ser: 0.81 mg/dL (ref 0.44–1.00)
GFR calc Af Amer: >60 mL/min (ref >60)
GFR calc non Af Amer: >60 mL/min (ref >60)
Glucose, Bld: 111 mg/dL — ABNORMAL HIGH (ref 70–99)
Potassium: 4.3 mmol/L (ref 3.5–5.1)
Sodium: 138 mmol/L (ref 135–145)

## 2018-07-23 LAB — MAGNESIUM: Magnesium: 2 mg/dL (ref 1.7–2.4)

## 2018-07-23 MED ORDER — PRO-STAT SUGAR FREE PO LIQD
30.0000 mL | Freq: Every day | ORAL | Status: DC
  Administered 2018-07-23 – 2018-07-31 (×8): 30 mL via ORAL
  Filled 2018-07-23 (×8): qty 30

## 2018-07-23 MED ORDER — ENSURE ENLIVE PO LIQD
237.0000 mL | Freq: Three times a day (TID) | ORAL | Status: DC
  Administered 2018-07-24 – 2018-07-31 (×17): 237 mL via ORAL

## 2018-07-23 MED ORDER — ALTEPLASE 2 MG IJ SOLR
2.0000 mg | Freq: Once | INTRAMUSCULAR | Status: AC
  Administered 2018-07-23: 2 mg
  Filled 2018-07-23: qty 2

## 2018-07-23 NOTE — Progress Notes (Signed)
Physical Therapy Treatment Patient Details Name: MELISS FLEEK MRN: [id number] DOB: [birth year] Today's Date: 07/23/2018    History of Present Illness CALEEN TAAFFE is a 68 y.o. female with history of diabetes mellitus type 2, sleep apnea, hypertension, Graves' disease, chronic and disease, anemia. Received epidural injection 05/23/2018. She is currently being treated for a lumbar discitis.  Pt admitted with continued back pain and found to have progression of abscess in epidural space of lumbar spine associated with Discitis and Osteomyelitis of L1-L2, and Progressive Abscess in Left Psoas Muscle; possible surgery early next  week     PT Comments    PT asked  to return to room per pt and RN as pt stated she wanted to try to get OOB 1 hour after her meds; pt initially refused when PT returned  but eventually agreed with maximum encouragement. Pt is over all min assist with bed mobility and transfers but is somewhat impulsive and therefore at risk for falls; pt is planning for surgery at [facility] on Tuesday 5/5. Recommend nursing mobilize/xfer this pt with +2 assist for safety, RN and NT aware.   Follow Up Recommendations  SNF;Supervision/Assistance - 24 hour     Equipment Recommendations  None recommended by PT    Recommendations for Other Services       Precautions / Restrictions Precautions Precautions: Fall;Back Restrictions Weight Bearing Restrictions: No    Mobility  Bed Mobility Overal bed mobility: Needs Assistance Bed Mobility: Rolling;Sidelying to Sit Rolling: Min assist Sidelying to sit: Min assist       General bed mobility comments: assist with trunk, cues for log roll; pt comes to sitting and then insists on lying back down--pt put herself into s/l and then with maximum encouragement agreed to attempt sitting again, bed ht raised to decr pt c/o beign "squished"  Transfers Overall transfer level: Needs assistance Equipment used: Rolling walker (2 wheeled) Transfers: Sit  to/from [facility] Sit to Stand: Min assist;+2 safety/equipment Stand pivot transfers: Min assist;+2 safety/equipment       General transfer comment: assist to rise and steady, cues for use of LEs to power up and hand placement  Ambulation/Gait             General Gait Details: limited to pivotal steps only d/t pain and fatigue   Stairs             Wheelchair Mobility    Modified Rankin (Stroke Patients Only)       Balance                                            Cognition Arousal/Alertness: Awake/alert Behavior During Therapy: Anxious;Impulsive Overall Cognitive Status: Impaired/Different from baseline Area of Impairment: Following commands                       Following Commands: Follows one step commands with increased time              Exercises      General Comments        Pertinent Vitals/Pain Pain Assessment: 0-10 Pain Score: 8  Pain Location: lower back and hips Pain Descriptors / Indicators: Radiating;Sharp Pain Intervention(s): Limited activity within patient's tolerance;Monitored during session;Premedicated before session;Repositioned    Home Living  Prior Function            PT Goals (current goals can now be found in the care plan section) Acute Rehab PT Goals Patient Stated Goal: less pain PT Goal Formulation: With patient Time For Goal Achievement: 08/01/18 Potential to Achieve Goals: Fair Progress towards PT goals: Progressing toward goals    Frequency    Min 3X/week      PT Plan Current plan remains appropriate    Co-evaluation              AM-PAC PT "6 Clicks" Mobility   Outcome Measure  Help needed turning from your back to your side while in a flat bed without using bedrails?: A Little Help needed moving from lying on your back to sitting on the side of a flat bed without using bedrails?: A Lot Help needed moving to and  from a bed to a chair (including a wheelchair)?: A Lot Help needed standing up from a chair using your arms (e.g., wheelchair or bedside chair)?: A Lot Help needed to walk in hospital room?: A Lot Help needed climbing 3-5 steps with a railing? : A Lot 6 Click Score: 13    End of Session Equipment Utilized During Treatment: Gait belt Activity Tolerance: Patient tolerated treatment well Patient left: in chair;with call bell/phone within reach;with chair alarm set Nurse Communication: Mobility status PT Visit Diagnosis: Other abnormalities of gait and mobility (R26.89)     Time: 4098-1191 PT Time Calculation (min) (ACUTE ONLY): 10 min  Charges:  $Gait Training: 8-22 mins                     Kenyon Ana, PT  Pager: [phone] Acute Rehab Dept [facility]): [phone]   07/23/2018    [facility] 07/23/2018, 3:59 PM

## 2018-07-23 NOTE — Progress Notes (Signed)
Physical Therapy Treatment Patient Details Name: [name] MRN: [id number] DOB: January 08, 1951 Today's Date: 07/23/2018    History of Present Illness [name] is a 68 y.o. female with history of diabetes mellitus type 2, sleep apnea, hypertension, Graves' disease, chronic and disease, anemia. Received epidural injection 05/23/2018. She is currently being treated for a lumbar discitis.  Pt admitted with continued back pain and found to have progression of abscess in epidural space of lumbar spine associated with Discitis and Osteomyelitis of L1-L2, and Progressive Abscess in Left Psoas Muscle; possible surgery early next  week     PT Comments    Pt progressing slowly, limited by pain and lack of motivation; attempted to contact pt family without response, pt tearful--see below; continue PT   Follow Up Recommendations  SNF;Supervision/Assistance - 24 hour     Equipment Recommendations  None recommended by PT    Recommendations for Other Services       Precautions / Restrictions Precautions Precautions: Fall;Back Restrictions Weight Bearing Restrictions: No    Mobility  Bed Mobility Overal bed mobility: Needs Assistance Bed Mobility: Rolling;Sidelying to Sit;Sit to Sidelying Rolling: Min assist Sidelying to sit: Min assist;Mod assist     Sit to sidelying: Supervision General bed mobility comments: attempted OOB,pt sat briefly then put her self back to bed refusing to do more; rolled right and left multiple times for pericare and pad change  Transfers                    Ambulation/Gait                 Stairs             Wheelchair Mobility    Modified Rankin (Stroke Patients Only)       Balance                                            Cognition Arousal/Alertness: Awake/alert Behavior During Therapy: WFL for tasks assessed/performed Overall Cognitive Status: Within Functional Limits for tasks assessed                                  General Comments: tearful, stating she feels that she is "just taking up space";  discussed progression/quality of life with pt, plan; attempted to contact family without response; texted pt family from her phone and asked them to install video app for video chatting as pt would very much like to see their faces; pt in agreement with this plan      Exercises      General Comments        Pertinent Vitals/Pain Pain Assessment: 0-10 Pain Score: 6  Pain Location: lower back Pain Descriptors / Indicators: Radiating;Sharp Pain Intervention(s): Limited activity within patient's tolerance;Monitored during session;Premedicated before session;Repositioned    Home Living                      Prior Function            PT Goals (current goals can now be found in the care plan section) Acute Rehab PT Goals Patient Stated Goal: less pain PT Goal Formulation: With patient Time For Goal Achievement: 08/01/18 Potential to Achieve Goals: Fair Progress towards PT goals: Progressing toward goals(slowly)    Frequency  Min 3X/week      PT Plan Current plan remains appropriate    Co-evaluation              AM-PAC PT "6 Clicks" Mobility   Outcome Measure  Help needed turning from your back to your side while in a flat bed without using bedrails?: A Little Help needed moving from lying on your back to sitting on the side of a flat bed without using bedrails?: A Lot Help needed moving to and from a bed to a chair (including a wheelchair)?: A Lot Help needed standing up from a chair using your arms (e.g., wheelchair or bedside chair)?: A Lot Help needed to walk in hospital room?: A Lot Help needed climbing 3-5 steps with a railing? : A Lot 6 Click Score: 13    End of Session Equipment Utilized During Treatment: Gait belt Activity Tolerance: Patient tolerated treatment well Patient left: in bed;with call bell/phone within reach;with bed  alarm set Nurse Communication: Mobility status PT Visit Diagnosis: Other abnormalities of gait and mobility (R26.89)     Time: 2751-7001 PT Time Calculation (min) (ACUTE ONLY): 36 min  Charges:  $Therapeutic Activity: 23-37 mins                     Kenyon Ana, PT  Pager: [phone] Acute Rehab Dept [facility] [phone]   07/23/2018    [facility] 07/23/2018, 11:43 AM

## 2018-07-23 NOTE — Progress Notes (Signed)
PROGRESS NOTE    [name]  HFW:[id number] DOB: [birth year] DOA: 07/11/2018 PCP: Crist Infante, MD   Brief Narrative:  HPI on 07/11/2018 by Dr. Debbe Odea [name] is a 68 y.o. female with medical history of DM 2 who presents with lower back pain. She is currently being treated for a lumbar discitis with Methicillin resistant coag neg staph with Vancomycin at home. She developed increasing back pain about 1 wk ago and noted a fever of about 100 degrees. She has had worsening of her pain with it radiating to her left buttock. She has no other complaints.   Interim history Admitted with abscess in the epidural space of lumbar spine associated with discitis and osteomyelitis.  Currently will require 4 weeks of IV vancomycin.  Plan of care consulted for pain management.  Neurosurgery currently following patient, with tentative plan for surgery next week, 07/28/18. Assessment & Plan   Abscess in epidural space of lumbar spine associated with discitis and osteomyelitis of L1-L2/myositis and bilateral psoas muscles, progressive abscess in left psoas muscle -Previously MRSE -Interventional radiology consulted and appreciated-wanted infectious disease and neurosurgery input -Neurosurgery consulted and appreciated, advised that repeat needle biopsy of the elbow wound would be preferable to an open surgical decompression -Infectious disease consulted and appreciated, recommended drainage of the abscess and holding antibiotics pending culture, and then starting vancomycin- plan to continue through 08/12/2018 with reimaging after 4 weeks -Status post underwent CT aspiration but there was no significant drainage, only a few drops of blood aspirated from left psoas site -Blood culture showed no growth to date -Aerobic and anaerobic cultures and Gram stain showed no organisms -Case was discussed with neurosurgery, Dr. Zada Finders, felt that no surgical recommendation was warranted at this time and  recommended prolonged course of antibiotics -Continue pain control, palliative care was consulted for symptom management -PT and OT recommended SNF placement -Discussed with social work, patient and husband would like for her to return home. Discussed with husband on 07/19/2018 regarding discharge and SNF. Both patient and husband are opened to SNF placement. -Discussed with neurosurgery, Dr. Ellene Route, obtained CT scan. He will discuss with the patient and husband possibility of surgery next week to help stabilize spine and clean out.  Tentative surgery 07/28/2018 which will need to occur at [facility].  Patient states she is leaning more towards moving forward with surgery as she feels this will give her a better quality of life. -have consulted nutrition in anticipation of surgery  Diabetes mellitus, type II -Continue Lantus, insulin sliding scale with CBG monitoring  Essential hypertension -Continue metoprolol  Graves' disease -Continue PTU  Fibromyalgia/depression/restless leg syndrome -Continue aripiprazole, fluoxetine, clonazepam, ropinirole  Chronic pain syndrome -Continue gabapentin, flexeril, toradol, fentanyl patch, as needed oxycodone as well as Dilaudid -Palliative care consulted and appreciated for pain control -Continue bowel regimen  Hyperlipidemia -Continue fenofibrate  Normocytic anemia -Anemia panel showed iron of 27, U IBC 247, TIBC 274, saturation ratio of 10%, ferritin 28, folate 15.7, B12 2063 -Stable, hemoglobin currently 9.6, baseline appears between 8 and 9  Hypokalemia -Resovled, continue to monitor   Hyponatremia -Resolved, continue to monitor  Oral candidiasis/thrush -Appears to be improving -Nurse had reported white patches on patient's tongue- currently appears to be clear and dry appearing -Completed course of fluconazole -will discontinue nystatin  Obesity, morbid -BMI of 37.79 -Patient will need to follow-up with her PCP regarding  lifestyle modifications  DVT Prophylaxis  Heparin  Code Status: Full  Family Communication: None  at bedside.  Disposition Plan: Admitted. Dispo pending possible surgery. Likely SNF.  Consultants Infectious disease Interventional radiology Neurosurgery  Procedures  None  Antibiotics   Anti-infectives (From admission, onward)   Start     Dose/Rate Route Frequency Ordered Stop   07/22/18 1800  vancomycin (VANCOCIN) 1,500 mg in sodium chloride 0.9 % 500 mL IVPB     1,500 mg 250 mL/hr over 120 Minutes Intravenous Every 24 hours 07/21/18 1804     07/17/18 1600  vancomycin (VANCOCIN) 1,750 mg in sodium chloride 0.9 % 500 mL IVPB  Status:  Discontinued     1,750 mg 250 mL/hr over 120 Minutes Intravenous Every 24 hours 07/17/18 1541 07/21/18 1804   07/17/18 1300  fluconazole (DIFLUCAN) tablet 200 mg  Status:  Discontinued     200 mg Oral Every 24 hours 07/16/18 1338 07/21/18 1043   07/15/18 1400  fluconazole (DIFLUCAN) IVPB 200 mg  Status:  Discontinued     200 mg 100 mL/hr over 60 Minutes Intravenous Every 24 hours 07/14/18 1322 07/16/18 1338   07/14/18 1400  vancomycin (VANCOCIN) 1,000 mg in sodium chloride 0.9 % 250 mL IVPB  Status:  Discontinued     1,000 mg 250 mL/hr over 60 Minutes Intravenous Every 24 hours 07/13/18 1304 07/14/18 0752   07/14/18 1400  vancomycin (VANCOCIN) 1,250 mg in sodium chloride 0.9 % 250 mL IVPB  Status:  Discontinued     1,250 mg 166.7 mL/hr over 90 Minutes Intravenous Every 24 hours 07/14/18 0752 07/17/18 1541   07/14/18 1400  fluconazole (DIFLUCAN) IVPB 200 mg     200 mg 100 mL/hr over 60 Minutes Intravenous  Once 07/14/18 1303 07/14/18 1549   07/13/18 1345  vancomycin (VANCOCIN) 2,000 mg in sodium chloride 0.9 % 500 mL IVPB     2,000 mg 250 mL/hr over 120 Minutes Intravenous  Once 07/13/18 1301 07/13/18 1615      Subjective:   [name] seen and examined today.  Continues to have pain especially with movement.  She denies chest pain,  shortness of breath, abdominal pain, nausea or vomiting, diarrhea or constipation, dizziness or headache.  She inquires about what will happen in where she will go after surgery. Objective:   Vitals:   07/22/18 0524 07/22/18 1325 07/22/18 2127 07/23/18 0510  BP: (!) 150/60 (!) 140/59 (!) 150/59 129/79  Pulse: 99 92 81 85  Resp: 18 16 18 18   Temp: 99 F (37.2 C) 97.6 F (36.4 C) 97.7 F (36.5 C) 98.6 F (37 C)  TempSrc: Oral Oral Oral Oral  SpO2: 92% 97% 92% 91%  Weight:      Height:        Intake/Output Summary (Last 24 hours) at 07/23/2018 1006 Last data filed at 07/23/2018 0940 Gross per 24 hour  Intake 1460 ml  Output 1800 ml  Net -340 ml   Filed Weights   07/11/18 1218  Weight: 90.7 kg   Exam  General: Well developed, really obese, chronically ill-appearing, NAD  HEENT: NCAT, mucous membranes moist.   Cardiovascular: S1 S2 auscultated, distant heart sounds, RRR  Respiratory: Clear to auscultation bilaterally anteriorly  Abdomen: Soft, obese, nontender, nondistended, + bowel sounds  Extremities: warm dry without cyanosis clubbing or edema  Neuro: AAOx3, nonfocal  Psych: Appropriate mood and affect  Data Reviewed: I have personally reviewed following labs and imaging studies  CBC: Recent Labs  Lab 07/16/18 1437 07/17/18 0339 07/18/18 0631 07/19/18 0409 07/20/18 0428 07/21/18 0310 07/22/18 0303 07/23/18 0413  WBC  2.8* 3.2* 2.4* 2.6* 2.5* 2.4*  --   --   NEUTROABS 1.6* 2.0 1.5* 1.6*  --   --   --   --   HGB 9.4* 9.1* 9.9* 9.2* 8.9* 8.7* 8.5* 9.6*  HCT 31.5* 30.3* 33.2* 30.1* 29.3* 30.0* 28.4* 33.2*  MCV 79.9* 79.5* 80.2 80.1 80.5 81.5  --   --   PLT 247 240 236 262 238 243  --   --    Basic Metabolic Panel: Recent Labs  Lab 07/16/18 1038 07/17/18 0339 07/18/18 0631 07/19/18 0409 07/20/18 0428 07/21/18 0310 07/23/18 0413  NA 134* 136 136 137 137 138 138  K 3.7 3.7 3.6 3.7 3.8 4.2 4.3  CL 100 101 100 102 101 102 101  CO2 26 26 25 28 28 27 27    GLUCOSE 111* 108* 146* 136* 133* 125* 111*  BUN 12 11 9 12 15 19 13   CREATININE 0.73 0.73 0.72 0.75 0.84 0.96 0.81  CALCIUM 9.2 9.5 9.8 9.3 9.5 9.4 9.7  MG 1.7 1.7 1.9 1.7 1.8  --  2.0  PHOS 2.8 3.2 3.7 3.2  --   --   --    GFR: Estimated Creatinine Clearance: 70.1 mL/min (by C-G formula based on SCr of 0.81 mg/dL). Liver Function Tests: Recent Labs  Lab 07/16/18 1038 07/17/18 0339 07/18/18 0631 07/19/18 0409  AST 27 26 35 39  ALT 21 21 25 28   ALKPHOS 100 99 105 97  BILITOT 0.4 0.5 0.8 0.5  PROT 7.1 6.9 7.5 6.8  ALBUMIN 3.3* 3.2* 3.4* 3.1*   No results for input(s): LIPASE, AMYLASE in the last 168 hours. No results for input(s): AMMONIA in the last 168 hours. Coagulation Profile: No results for input(s): INR, PROTIME in the last 168 hours. Cardiac Enzymes: No results for input(s): CKTOTAL, CKMB, CKMBINDEX, TROPONINI in the last 168 hours. BNP (last 3 results) No results for input(s): PROBNP in the last 8760 hours. HbA1C: No results for input(s): HGBA1C in the last 72 hours. CBG: Recent Labs  Lab 07/22/18 0743 07/22/18 1145 07/22/18 1719 07/22/18 2134 07/23/18 0743  GLUCAP 91 148* 137* 124* 113*   Lipid Profile: No results for input(s): CHOL, HDL, LDLCALC, TRIG, CHOLHDL, LDLDIRECT in the last 72 hours. Thyroid Function Tests: No results for input(s): TSH, T4TOTAL, FREET4, T3FREE, THYROIDAB in the last 72 hours. Anemia Panel: No results for input(s): VITAMINB12, FOLATE, FERRITIN, TIBC, IRON, RETICCTPCT in the last 72 hours. Urine analysis:    Component Value Date/Time   COLORURINE YELLOW 07/14/2018 0914   APPEARANCEUR CLEAR 07/14/2018 0914   LABSPEC 1.013 07/14/2018 0914   PHURINE 6.0 07/14/2018 0914   GLUCOSEU NEGATIVE 07/14/2018 0914   HGBUR NEGATIVE 07/14/2018 0914   BILIRUBINUR NEGATIVE 07/14/2018 0914   KETONESUR 5 (A) 07/14/2018 0914   PROTEINUR NEGATIVE 07/14/2018 0914   UROBILINOGEN 0.2 07/20/2014 1707   NITRITE NEGATIVE 07/14/2018 0914    LEUKOCYTESUR NEGATIVE 07/14/2018 0914   Sepsis Labs: @LABRCNTIP (procalcitonin:4,lacticidven:4)  ) Recent Results (from the past 240 hour(s))  Aerobic/Anaerobic Culture (surgical/deep wound)     Status: None   Collection Time: 07/13/18 11:12 AM  Result Value Ref Range Status   Specimen Description   Final    ABSCESS LT PSOAS Performed at [facility], [place] [address] [place], [place] [address]    Special Requests   Final    NONE Performed at [facility], [place] [address] [place], [place] [address]    Gram Stain NO WBC SEEN NO ORGANISMS  SEEN   Final   Culture   Final    No growth aerobically or anaerobically. Performed at [facility], [place] [address] [place], [place] [address]    Report Status 07/18/2018 FINAL  Final      Radiology Studies: No results found.   Scheduled Meds: . ARIPiprazole  5 mg Oral Daily  . clonazePAM  0.5 mg Oral QHS  . feeding supplement (ENSURE ENLIVE)  237 mL Oral BID BM  . fenofibrate  54 mg Oral Q supper  . fentaNYL  1 patch Transdermal Q72H  . FLUoxetine  60 mg Oral QHS  . gabapentin  200 mg Oral 3 times per day  . heparin injection (subcutaneous)  5,000 Units Subcutaneous Q8H  . insulin aspart  0-5 Units Subcutaneous QHS  . insulin aspart  0-9 Units Subcutaneous TID WC  . insulin glargine  20 Units Subcutaneous BID  . metoprolol tartrate  12.5 mg Oral TID  . nystatin  5 mL Oral QID  . pantoprazole  40 mg Oral Daily  . propylthiouracil  75 mg Oral Daily  . rOPINIRole  3 mg Oral QHS  . senna  2 tablet Oral QHS  . sodium chloride flush  3 mL Intravenous Q12H   Continuous Infusions: . sodium chloride    . vancomycin Stopped (07/22/18 2036)     LOS: 12 days   Time Spent in minutes   30 minutes  Zariah Jost D.O. on 07/23/2018 at 10:06 AM  Between 7am to 7pm - Please see pager noted on [email or web address]  After 7pm go to [email or web address]  And look for the night coverage person covering for me  after hours  [facility] Office  [phone]

## 2018-07-23 NOTE — Plan of Care (Signed)
  Problem: Health Behavior/Discharge Planning: Goal: Ability to manage health-related needs will improve Outcome: Progressing   Problem: Clinical Measurements: Goal: Ability to maintain clinical measurements within normal limits will improve Outcome: Progressing Goal: Will remain free from infection Outcome: Progressing Goal: Diagnostic test results will improve Outcome: Progressing Goal: Respiratory complications will improve Outcome: Progressing Goal: Cardiovascular complication will be avoided Outcome: Progressing   Problem: Activity: Goal: Risk for activity intolerance will decrease Outcome: Progressing   Problem: Coping: Goal: Level of anxiety will decrease Outcome: Progressing   Problem: Pain Managment: Goal: General experience of comfort will improve Outcome: Progressing   Problem: Safety: Goal: Ability to remain free from injury will improve Outcome: Progressing   Problem: Skin Integrity: Goal: Risk for impaired skin integrity will decrease Outcome: Progressing  Plan of care discussed with patient
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
## 2018-07-23 NOTE — Progress Notes (Signed)
Initial Nutrition Assessment  RD working remotely.   DOCUMENTATION CODES:   Obesity unspecified  INTERVENTION:  - increase Ensure Enlive to TID, each supplement provides 350 kcal and 20 grams of protein. - will order 30 mL Prostat once/day, each supplement provides 100 kcal and 15 grams of protein. - weigh patient today. - continue to encourage PO intakes. - will continue to monitor for additional nutrition-related needs.   NUTRITION DIAGNOSIS:   Inadequate oral intake related to acute illness as evidenced by meal completion < 50%.  GOAL:   Patient will meet greater than or equal to 90% of their needs  MONITOR:   PO intake, Supplement acceptance, Labs, Weight trends  REASON FOR ASSESSMENT:   Consult Assessment of nutrition requirement/status  ASSESSMENT:   68 y.o. female with medical history of type 2 DM who presented to the ED with lower back pain. She was being treated for a lumbar discitis at home and developed increasing back pain about 1 week PTA and also noted fever of ~100 degrees F. Patient admitted on 4/21 with abscess in the epidural space of lumbar spine associated with discitis and osteomyelitis which will require 4 weeks of IV vancomycin. Neurosurgery following patient, with tentative plan for surgery this week, on 5/8.  Patient has not been weighed since admission (4/21). Per chart review, last BM was on 4/30. Patient has only been consuming 10-25% of meals since that time. Patient was ordered Ensure Enlive BID at the time of admission and has been accepting and drinking nearly every bottle since that time; will increase to TID as meal intakes are poor. Patient reports severe weakness and this has led to decreased mobility which has led to decreased hunger and decreased desire to eat. Question if patient may also be depressed which is leading to poor appetite.   Weight on admission was 200 lb and weight on 06/02/18 was 239 lb. This indicates 39 lb weight loss (16%  body weight) in the past 1.5 months; significant for time frame. Will monitor weight trend when patient is weighed today. Patient likely meets criteria for malnutrition but unable to confirm at this time.   Patient is being followed by Palliative Care and was last seen this AM. Patient remains full code at this time. Dr. Inda Castle note indicates likely d/c to SNF with Palliative Care to follow there.   Per notes, patient has thrush which appears to be improving. Could also be contributing to poor intakes.    Medications reviewed; sliding scale novolog, 20 units lantus BID, 2 tablets senokot/day.  Labs reviewed; CBGs: 113 and 139 mg/dl today.     NUTRITION - FOCUSED PHYSICAL EXAM:  unable to complete at this time.   Diet Order:   Diet Order            Diet Carb Modified Fluid consistency: Thin; Room service appropriate? Yes  Diet effective now              EDUCATION NEEDS:   No education needs have been identified at this time  Skin:  Skin Assessment: Reviewed RN Assessment  Last BM:  4/30  Height:   Ht Readings from Last 1 Encounters:  07/11/18 5\' 1"  (1.549 m)    Weight:   Wt Readings from Last 1 Encounters:  07/11/18 90.7 kg    Ideal Body Weight:  47.73 kg  BMI:  Body mass index is 37.79 kg/m.  Estimated Nutritional Needs:   Kcal:  1900-2100 kcal  Protein:  90-100 grams  Fluid:  >/= 1.8 L/day     Jarome Matin, MS, RD, LDN, [facility] Inpatient Clinical Dietitian Pager # [phone] After hours/weekend pager # [phone]

## 2018-07-24 LAB — GLUCOSE, CAPILLARY
Glucose-Capillary: 111 mg/dL — ABNORMAL HIGH (ref 70–99)
Glucose-Capillary: 112 mg/dL — ABNORMAL HIGH (ref 70–99)
Glucose-Capillary: 127 mg/dL — ABNORMAL HIGH (ref 70–99)
Glucose-Capillary: 175 mg/dL — ABNORMAL HIGH (ref 70–99)
Glucose-Capillary: 190 mg/dL — ABNORMAL HIGH (ref 70–99)

## 2018-07-24 LAB — CREATININE, SERUM
Creatinine, Ser: 0.94 mg/dL (ref 0.44–1.00)
GFR calc Af Amer: >60 mL/min (ref >60)
GFR calc non Af Amer: >60 mL/min (ref >60)

## 2018-07-24 NOTE — Progress Notes (Signed)
VAST RN consulted to draw vanc trough. Upon arrival to pt's bedside noted lab ordered to be drawn 07/25/18. Informed unit RN to place IVT consult tomorrow prior to time due.

## 2018-07-24 NOTE — Progress Notes (Addendum)
Pharmacy Antibiotic Note  [name] is a 68 y.o. female with lumbar discitis caused by methicillin resistant CoNS in March 2020 and was on vancomycin PTA. Vancomycin course stated 06/03/18 with a planned 6 week duration, expected to end on 07/16/18. Most recent regimen was vancomycin 1000 mg IV q24h. Last dose of vancomycin PTA was 07/10/18, antibiotics were held on admission and resumed on 4/23.  With limited drainage from CT aspiration, ID recommends treating for another 4 weeks with abx (treat thru 5/23).  Today, 07/24/2018:  Scr remains WNL. AF   Plan:  continue vancomycin to 1500 mg IV q24 hr   check vanc trough prior to 4th dose on 5/5 at 1730 prior to 1800 dose  monitor renal function  Addendum: vanc dose given late 5/4 will change vanc trough to 5/6 and order on 5/6 Eudelia Bunch, Pharm.D 07/25/2018 7:41 AM   ____________________________________________  Height: 5\' 1"  (154.9 cm) Weight: 200 lb (90.7 kg) IBW/kg (Calculated) : 47.8  Temp (24hrs), Avg:98.5 F (36.9 C), Min:98.5 F (36.9 C), Max:98.5 F (36.9 C)  Recent Labs  Lab 07/18/18 0631 07/19/18 0409 07/20/18 0428 07/21/18 0310 07/21/18 1726 07/23/18 0413 07/24/18 0326  WBC 2.4* 2.6* 2.5* 2.4*  --   --   --   CREATININE 0.72 0.75 0.84 0.96  --  0.81 0.94  VANCOTROUGH  --   --   --   --  20  --   --     Estimated Creatinine Clearance: 60.4 mL/min (by C-G formula based on SCr of 0.94 mg/dL).    Allergies  Allergen Reactions  . Imitrex [Sumatriptan] Other (See Comments)    Vascular spasms  . Nsaids Other (See Comments)    PT UNABLE TO TOLERATE NSAID'S DRUGS DUE TO HX OF GASTRIC SLEEVE SURGERY  . Codeine Nausea Only  . Promethazine Hcl Other (See Comments)    Restless leg feeling all over body  . Rosuvastatin Calcium Other (See Comments)    Leg/muscle pain  . Statins Other (See Comments)    Leg/muscle pain  . Sulfonamide Derivatives Hives    Childhood allergy   . Vicodin  [Hydrocodone-Acetaminophen] Nausea And Vomiting    Antimicrobials this admission: Prior to admission: Vancomycin 06/03/18 > 07/10/18 (planned stop 4/26) --> but now plan to cont with another prolonged course of abx  Vancomycin 4/23 >> Fluconazole 4/24 ( oropharyngeal candidiasis) >>4/30  Dose adjustment: 4/27 at 1345 VT= 10 --> changed to 1750 mg IV q24h 5/1 VT = 20 (with SCr 0.96); reduce to 1500 q24  Microbiology results: 4/21 BCx: neg FINAL 4/23 abscess, L psoas:  neg FINAL  Previous recent cultures: 3/17 Urine: >100K pseudomonas, proteus 3/14 aspiration of L1,L2 disc space: abundant staphylococcus epidermidis (oxacillin resistant)  Thank you for allowing pharmacy to be a part of this patient's care.  Eudelia Bunch, Pharm.D [phone] 07/24/2018 1:49 PM

## 2018-07-24 NOTE — Progress Notes (Signed)
PROGRESS NOTE    [name]  KLK:[id number] DOB: January 16, 1951 DOA: 07/11/2018 PCP: Crist Infante, MD   Brief Narrative:  HPI on 07/11/2018 by Dr. Debbe Odea [name] is a 68 y.o. female with medical history of DM 2 who presents with lower back pain. She is currently being treated for a lumbar discitis with Methicillin resistant coag neg staph with Vancomycin at home. She developed increasing back pain about 1 wk ago and noted a fever of about 100 degrees. She has had worsening of her pain with it radiating to her left buttock. She has no other complaints.   Interim history Admitted with abscess in the epidural space of lumbar spine associated with discitis and osteomyelitis.  Currently will require 4 weeks of IV vancomycin.  Plan of care consulted for pain management.  Neurosurgery currently following patient, with tentative plan for surgery next week, 07/28/18. Assessment & Plan   Abscess in epidural space of lumbar spine associated with discitis and osteomyelitis of L1-L2/myositis and bilateral psoas muscles, progressive abscess in left psoas muscle -Previously MRSE -Interventional radiology consulted and appreciated-wanted infectious disease and neurosurgery input -Neurosurgery consulted and appreciated, advised that repeat needle biopsy of the elbow wound would be preferable to an open surgical decompression -Infectious disease consulted and appreciated, recommended drainage of the abscess and holding antibiotics pending culture, and then starting vancomycin- plan to continue through 08/12/2018 with reimaging after 4 weeks -Status post underwent CT aspiration but there was no significant drainage, only a few drops of blood aspirated from left psoas site -Blood culture showed no growth to date -Aerobic and anaerobic cultures and Gram stain showed no organisms -Case was discussed with neurosurgery, Dr. Zada Finders, felt that no surgical recommendation was warranted at this time and  recommended prolonged course of antibiotics -Continue pain control, palliative care was consulted for symptom management -PT and OT recommended SNF placement -Discussed with social work, patient and husband would like for her to return home. Discussed with husband on 07/19/2018 regarding discharge and SNF. Both patient and husband are opened to SNF placement. -Discussed with neurosurgery, Dr. Ellene Route, obtained CT scan. He will discuss with the patient and husband possibility of surgery next week to help stabilize spine and clean out.  Tentative surgery 07/28/2018 which will need to occur at [facility].  Patient states she is leaning more towards moving forward with surgery as she feels this will give her a better quality of life. -have consulted nutrition in anticipation of surgery- continue supplemenation  Diabetes mellitus, type II -Continue Lantus, insulin sliding scale with CBG monitoring  Essential hypertension -Continue metoprolol  Graves' disease -Continue PTU  Fibromyalgia/depression/restless leg syndrome -Continue aripiprazole, fluoxetine, clonazepam, ropinirole  Chronic pain syndrome -Continue gabapentin, flexeril, toradol, fentanyl patch, as needed oxycodone as well as Dilaudid -Palliative care consulted and appreciated for pain control -Continue bowel regimen  Hyperlipidemia -Continue fenofibrate  Normocytic anemia -Anemia panel showed iron of 27, U IBC 247, TIBC 274, saturation ratio of 10%, ferritin 28, folate 15.7, B12 2063 -Stable, hemoglobin 9.6, baseline appears between 8 and 9  Hypokalemia -Resovled, continue to monitor   Hyponatremia -Resolved, continue to monitor  Oral candidiasis/thrush -Appears to be improving -Nurse had reported white patches on patient's tongue- currently appears to be clear and dry appearing -Completed course of fluconazole and nystatin  Obesity, morbid -BMI of 37.79 -Patient will need to follow-up with her PCP regarding  lifestyle modifications  DVT Prophylaxis  Heparin  Code Status: Full  Family Communication: None  at bedside.  Disposition Plan: Admitted. Dispo pending possible surgery. Likely SNF.  Consultants Infectious disease Interventional radiology Neurosurgery  Procedures  None  Antibiotics   Anti-infectives (From admission, onward)   Start     Dose/Rate Route Frequency Ordered Stop   07/22/18 1800  vancomycin (VANCOCIN) 1,500 mg in sodium chloride 0.9 % 500 mL IVPB     1,500 mg 250 mL/hr over 120 Minutes Intravenous Every 24 hours 07/21/18 1804     07/17/18 1600  vancomycin (VANCOCIN) 1,750 mg in sodium chloride 0.9 % 500 mL IVPB  Status:  Discontinued     1,750 mg 250 mL/hr over 120 Minutes Intravenous Every 24 hours 07/17/18 1541 07/21/18 1804   07/17/18 1300  fluconazole (DIFLUCAN) tablet 200 mg  Status:  Discontinued     200 mg Oral Every 24 hours 07/16/18 1338 07/21/18 1043   07/15/18 1400  fluconazole (DIFLUCAN) IVPB 200 mg  Status:  Discontinued     200 mg 100 mL/hr over 60 Minutes Intravenous Every 24 hours 07/14/18 1322 07/16/18 1338   07/14/18 1400  vancomycin (VANCOCIN) 1,000 mg in sodium chloride 0.9 % 250 mL IVPB  Status:  Discontinued     1,000 mg 250 mL/hr over 60 Minutes Intravenous Every 24 hours 07/13/18 1304 07/14/18 0752   07/14/18 1400  vancomycin (VANCOCIN) 1,250 mg in sodium chloride 0.9 % 250 mL IVPB  Status:  Discontinued     1,250 mg 166.7 mL/hr over 90 Minutes Intravenous Every 24 hours 07/14/18 0752 07/17/18 1541   07/14/18 1400  fluconazole (DIFLUCAN) IVPB 200 mg     200 mg 100 mL/hr over 60 Minutes Intravenous  Once 07/14/18 1303 07/14/18 1549   07/13/18 1345  vancomycin (VANCOCIN) 2,000 mg in sodium chloride 0.9 % 500 mL IVPB     2,000 mg 250 mL/hr over 120 Minutes Intravenous  Once 07/13/18 1301 07/13/18 1615      Subjective:   Tayona Sarnowski seen and examined today.  Dates that she had a restless evening and was confused.  She currently denies  any chest pain, shortness of breath, abdominal pain, nausea or vomiting, diarrhea constipation, dizziness or headache.  Continues to have back pain and looking forward to surgery. Objective:   Vitals:   07/23/18 1335 07/23/18 2108 07/23/18 2140 07/24/18 0609  BP: (!) 142/62 (!) 147/58  (!) 146/108  Pulse: 83 95  91  Resp: 16 18 16 18   Temp: 97.8 F (36.6 C) 98.5 F (36.9 C)  98.5 F (36.9 C)  TempSrc: Oral Oral  Oral  SpO2: 98% 93%  94%  Weight:      Height:        Intake/Output Summary (Last 24 hours) at 07/24/2018 1029 Last data filed at 07/24/2018 1023 Gross per 24 hour  Intake 540 ml  Output 1700 ml  Net -1160 ml   Filed Weights   07/11/18 1218  Weight: 90.7 kg   Exam  General: Well developed, morbidly obese, chronically ill-appearing, NAD  HEENT: NCAT, mucous membranes moist.   Cardiovascular: S1 S2 auscultated, heart sounds, RRR  Respiratory: Clear to auscultation bilaterally (anterior)  Abdomen: Soft, morbidly obese, nontender, nondistended, + bowel sounds  Extremities: warm dry without cyanosis clubbing or edema  Neuro: AAOx3, nonfocal  Psych: Does not, appropriate mood and affect  Data Reviewed: I have personally reviewed following labs and imaging studies  CBC: Recent Labs  Lab 07/18/18 0631 07/19/18 0409 07/20/18 0428 07/21/18 0310 07/22/18 0303 07/23/18 0413  WBC 2.4* 2.6* 2.5* 2.4*  --   --  NEUTROABS 1.5* 1.6*  --   --   --   --   HGB 9.9* 9.2* 8.9* 8.7* 8.5* 9.6*  HCT 33.2* 30.1* 29.3* 30.0* 28.4* 33.2*  MCV 80.2 80.1 80.5 81.5  --   --   PLT 236 262 238 243  --   --    Basic Metabolic Panel: Recent Labs  Lab 07/18/18 0631 07/19/18 0409 07/20/18 0428 07/21/18 0310 07/23/18 0413 07/24/18 0326  NA 136 137 137 138 138  --   K 3.6 3.7 3.8 4.2 4.3  --   CL 100 102 101 102 101  --   CO2 25 28 28 27 27   --   GLUCOSE 146* 136* 133* 125* 111*  --   BUN 9 12 15 19 13   --   CREATININE 0.72 0.75 0.84 0.96 0.81 0.94  CALCIUM 9.8 9.3 9.5  9.4 9.7  --   MG 1.9 1.7 1.8  --  2.0  --   PHOS 3.7 3.2  --   --   --   --    GFR: Estimated Creatinine Clearance: 60.4 mL/min (by C-G formula based on SCr of 0.94 mg/dL). Liver Function Tests: Recent Labs  Lab 07/18/18 0631 07/19/18 0409 07/23/18 0413  AST 35 39  --   ALT 25 28  --   ALKPHOS 105 97  --   BILITOT 0.8 0.5  --   PROT 7.5 6.8  --   ALBUMIN 3.4* 3.1* 3.3*   No results for input(s): LIPASE, AMYLASE in the last 168 hours. No results for input(s): AMMONIA in the last 168 hours. Coagulation Profile: No results for input(s): INR, PROTIME in the last 168 hours. Cardiac Enzymes: No results for input(s): CKTOTAL, CKMB, CKMBINDEX, TROPONINI in the last 168 hours. BNP (last 3 results) No results for input(s): PROBNP in the last 8760 hours. HbA1C: No results for input(s): HGBA1C in the last 72 hours. CBG: Recent Labs  Lab 07/23/18 1247 07/23/18 1727 07/23/18 2152 07/24/18 0021 07/24/18 0758  GLUCAP 139* 162* 205* 127* 111*   Lipid Profile: No results for input(s): CHOL, HDL, LDLCALC, TRIG, CHOLHDL, LDLDIRECT in the last 72 hours. Thyroid Function Tests: No results for input(s): TSH, T4TOTAL, FREET4, T3FREE, THYROIDAB in the last 72 hours. Anemia Panel: No results for input(s): VITAMINB12, FOLATE, FERRITIN, TIBC, IRON, RETICCTPCT in the last 72 hours. Urine analysis:    Component Value Date/Time   COLORURINE YELLOW 07/14/2018 0914   APPEARANCEUR CLEAR 07/14/2018 0914   LABSPEC 1.013 07/14/2018 0914   PHURINE 6.0 07/14/2018 0914   GLUCOSEU NEGATIVE 07/14/2018 0914   HGBUR NEGATIVE 07/14/2018 0914   BILIRUBINUR NEGATIVE 07/14/2018 0914   KETONESUR 5 (A) 07/14/2018 0914   PROTEINUR NEGATIVE 07/14/2018 0914   UROBILINOGEN 0.2 07/20/2014 1707   NITRITE NEGATIVE 07/14/2018 0914   LEUKOCYTESUR NEGATIVE 07/14/2018 0914   Sepsis Labs: @LABRCNTIP (procalcitonin:4,lacticidven:4)  ) No results found for this or any previous visit (from the past 240 hour(s)).     Radiology Studies: No results found.   Scheduled Meds: . ARIPiprazole  5 mg Oral Daily  . clonazePAM  0.5 mg Oral QHS  . feeding supplement (ENSURE ENLIVE)  237 mL Oral TID BM  . feeding supplement (PRO-STAT SUGAR FREE 64)  30 mL Oral Daily  . fenofibrate  54 mg Oral Q supper  . fentaNYL  1 patch Transdermal Q72H  . FLUoxetine  60 mg Oral QHS  . gabapentin  200 mg Oral 3 times per day  . heparin injection (subcutaneous)  5,000 Units Subcutaneous Q8H  . insulin aspart  0-5 Units Subcutaneous QHS  . insulin aspart  0-9 Units Subcutaneous TID WC  . insulin glargine  20 Units Subcutaneous BID  . metoprolol tartrate  12.5 mg Oral TID  . pantoprazole  40 mg Oral Daily  . propylthiouracil  75 mg Oral Daily  . rOPINIRole  3 mg Oral QHS  . senna  2 tablet Oral QHS  . sodium chloride flush  3 mL Intravenous Q12H   Continuous Infusions: . sodium chloride    . vancomycin 1,500 mg (07/23/18 1803)     LOS: 13 days   Time Spent in minutes   25 minutes  Keymon Mcelroy D.O. on 07/24/2018 at 10:29 AM  Between 7am to 7pm - Please see pager noted on [email or web address]  After 7pm go to [email or web address]  And look for the night coverage person covering for me after hours  [facility] Office  [phone]

## 2018-07-25 LAB — BASIC METABOLIC PANEL
Anion gap: 8 (ref 5–15)
BUN: 22 mg/dL (ref 8–23)
CO2: 30 mmol/L (ref 22–32)
Calcium: 9.5 mg/dL (ref 8.9–10.3)
Chloride: 100 mmol/L (ref 98–111)
Creatinine, Ser: 0.91 mg/dL (ref 0.44–1.00)
GFR calc Af Amer: >60 mL/min (ref >60)
GFR calc non Af Amer: >60 mL/min (ref >60)
Glucose, Bld: 107 mg/dL — ABNORMAL HIGH (ref 70–99)
Potassium: 4.2 mmol/L (ref 3.5–5.1)
Sodium: 138 mmol/L (ref 135–145)

## 2018-07-25 LAB — HEMOGLOBIN AND HEMATOCRIT, BLOOD
HCT: 29.9 % — ABNORMAL LOW (ref 36.0–46.0)
Hemoglobin: 8.8 g/dL — ABNORMAL LOW (ref 12.0–15.0)

## 2018-07-25 LAB — VANCOMYCIN, TROUGH: Vancomycin Tr: 17 ug/mL (ref 15–20)

## 2018-07-25 LAB — GLUCOSE, CAPILLARY
Glucose-Capillary: 136 mg/dL — ABNORMAL HIGH (ref 70–99)
Glucose-Capillary: 163 mg/dL — ABNORMAL HIGH (ref 70–99)
Glucose-Capillary: 112 mg/dL — ABNORMAL HIGH (ref 70–99)
Glucose-Capillary: 197 mg/dL — ABNORMAL HIGH (ref 70–99)

## 2018-07-25 NOTE — Progress Notes (Signed)
Patient refusing to get OOB this AM. Patient is screaming and stating "im not doing this, you can get the doctor to tell me"  Patient refusing repositioning, and to get OOB to urinate. Only wants to use the Purewick.   Patient has moisture associated breakdown on sacrum secondary to refusing to be repostioned, or get OOB.    Will continue to encourage patient, and follow up with the MD.

## 2018-07-25 NOTE — Progress Notes (Signed)
RN called report to [facility]. [facility] will be called to arrange transport to [facility].

## 2018-07-25 NOTE — Progress Notes (Signed)
Patient refusing to get up at this time when she needs to have a bowel movement.   Patient educated for a third time this shift about moving off her bottom, and getting OOB to have a BM.   Patient refusing again, and states "I can't, just give me the bedpan"

## 2018-07-25 NOTE — Progress Notes (Signed)
PROGRESS NOTE    [name]  DJT:[id number] DOB: May 28, 1950 DOA: 07/11/2018 PCP: Crist Infante, MD   Brief Narrative:  HPI on 07/11/2018 by Dr. Debbe Odea [name] is a 68 y.o. female with medical history of DM 2 who presents with lower back pain. She is currently being treated for a lumbar discitis with Methicillin resistant coag neg staph with Vancomycin at home. She developed increasing back pain about 1 wk ago and noted a fever of about 100 degrees. She has had worsening of her pain with it radiating to her left buttock. She has no other complaints.   Interim history Admitted with abscess in the epidural space of lumbar spine associated with discitis and osteomyelitis.  Currently will require 4 weeks of IV vancomycin.  Plan of care consulted for pain management.  Neurosurgery currently following patient, with tentative plan for surgery next week, 07/28/18. Patient will be transferred to [facility]. Assessment & Plan   Abscess in epidural space of lumbar spine associated with discitis and osteomyelitis of L1-L2/myositis and bilateral psoas muscles, progressive abscess in left psoas muscle -Previously MRSE -Interventional radiology consulted and appreciated-wanted infectious disease and neurosurgery input -Neurosurgery consulted and appreciated, advised that repeat needle biopsy of the elbow wound would be preferable to an open surgical decompression -Infectious disease consulted and appreciated, recommended drainage of the abscess and holding antibiotics pending culture, and then starting vancomycin- plan to continue through 08/12/2018 with reimaging after 4 weeks -Status post underwent CT aspiration but there was no significant drainage, only a few drops of blood aspirated from left psoas site -Blood culture showed no growth to date -Aerobic and anaerobic cultures and Gram stain showed no organisms -Case was discussed with neurosurgery, Dr. Zada Finders, felt that no surgical  recommendation was warranted at this time and recommended prolonged course of antibiotics -Continue pain control, palliative care was consulted for symptom management -PT and OT recommended SNF placement -Discussed with social work, patient and husband would like for her to return home. Discussed with husband on 07/19/2018 regarding discharge and SNF. Both patient and husband are opened to SNF placement. -Discussed with neurosurgery, Dr. Ellene Route, obtained CT scan. He will discuss with the patient and husband possibility of surgery next week to help stabilize spine and clean out.  Tentative surgery 07/28/2018 which will need to occur at [facility].  Patient states she is leaning more towards moving forward with surgery as she feels this will give her a better quality of life. -have consulted nutrition in anticipation of surgery- continue supplemenation  Diabetes mellitus, type II -Continue Lantus, insulin sliding scale with CBG monitoring  Essential hypertension -Continue metoprolol  Graves' disease -Continue PTU  Fibromyalgia/depression/restless leg syndrome -Continue aripiprazole, fluoxetine, clonazepam, ropinirole  Chronic pain syndrome -Continue gabapentin, flexeril, toradol, fentanyl patch, as needed oxycodone as well as Dilaudid -Palliative care consulted and appreciated for pain control -Continue bowel regimen  Hyperlipidemia -Continue fenofibrate  Normocytic anemia -Anemia panel showed iron of 27, U IBC 247, TIBC 274, saturation ratio of 10%, ferritin 28, folate 15.7, B12 2063 -Stable, hemoglobin 8.8, baseline appears between 8 and 9  Hypokalemia -Resovled, continue to monitor   Hyponatremia -Resolved, continue to monitor  Oral candidiasis/thrush -Appears to be improving -Nurse had reported white patches on patient's tongue- currently appears to be clear and dry appearing -Completed course of fluconazole and nystatin  Obesity, morbid -BMI of 37.79 -Patient will  need to follow-up with her PCP regarding lifestyle modifications  DVT Prophylaxis  Heparin  Code Status: Full  Family Communication: None at bedside.  Disposition Plan: Admitted. Dispo pending possible surgery. Likely SNF. Will transfer to [facility] today in anticipation of surgery on 07/28/2018  Consultants Infectious disease Interventional radiology Neurosurgery  Procedures  None  Antibiotics   Anti-infectives (From admission, onward)   Start     Dose/Rate Route Frequency Ordered Stop   07/22/18 1800  vancomycin (VANCOCIN) 1,500 mg in sodium chloride 0.9 % 500 mL IVPB     1,500 mg 250 mL/hr over 120 Minutes Intravenous Every 24 hours 07/21/18 1804     07/17/18 1600  vancomycin (VANCOCIN) 1,750 mg in sodium chloride 0.9 % 500 mL IVPB  Status:  Discontinued     1,750 mg 250 mL/hr over 120 Minutes Intravenous Every 24 hours 07/17/18 1541 07/21/18 1804   07/17/18 1300  fluconazole (DIFLUCAN) tablet 200 mg  Status:  Discontinued     200 mg Oral Every 24 hours 07/16/18 1338 07/21/18 1043   07/15/18 1400  fluconazole (DIFLUCAN) IVPB 200 mg  Status:  Discontinued     200 mg 100 mL/hr over 60 Minutes Intravenous Every 24 hours 07/14/18 1322 07/16/18 1338   07/14/18 1400  vancomycin (VANCOCIN) 1,000 mg in sodium chloride 0.9 % 250 mL IVPB  Status:  Discontinued     1,000 mg 250 mL/hr over 60 Minutes Intravenous Every 24 hours 07/13/18 1304 07/14/18 0752   07/14/18 1400  vancomycin (VANCOCIN) 1,250 mg in sodium chloride 0.9 % 250 mL IVPB  Status:  Discontinued     1,250 mg 166.7 mL/hr over 90 Minutes Intravenous Every 24 hours 07/14/18 0752 07/17/18 1541   07/14/18 1400  fluconazole (DIFLUCAN) IVPB 200 mg     200 mg 100 mL/hr over 60 Minutes Intravenous  Once 07/14/18 1303 07/14/18 1549   07/13/18 1345  vancomycin (VANCOCIN) 2,000 mg in sodium chloride 0.9 % 500 mL IVPB     2,000 mg 250 mL/hr over 120 Minutes Intravenous  Once 07/13/18 1301 07/13/18 1615      Subjective:    [name] seen and examined today.  States she is ready to have surgery. She continues to have pain, but the medications help. Denies chest pain, shortness of breath, abdominal pain, N/V/D/C. Objective:   Vitals:   07/24/18 1042 07/24/18 1424 07/24/18 2101 07/25/18 0506  BP:  (!) 134/59 (!) 103/41 (!) 146/63  Pulse: 98 92 87 90  Resp:  16 16 16   Temp:   97.7 F (36.5 C) 97.8 F (36.6 C)  TempSrc:   Oral Oral  SpO2: 94% 91% 94% 92%  Weight:      Height:        Intake/Output Summary (Last 24 hours) at 07/25/2018 0859 Last data filed at 07/25/2018 0500 Gross per 24 hour  Intake 730 ml  Output 400 ml  Net 330 ml   Filed Weights   07/11/18 1218  Weight: 90.7 kg   Exam  General: Well developed, morbidly obese, chronically ill-appearing, NAD  HEENT: NCAT, mucous membranes moist.   Neck: Supple  Cardiovascular: S1 S2 auscultated, RRR, distant heart sounds  Respiratory: Clear to auscultation bilaterally (anteriorly)  Abdomen: Soft, nontender, nondistended, + bowel sounds  Extremities: warm dry without cyanosis clubbing or edema  Neuro: AAOx3, nonfocal  Psych: Pleasant, appropriate mood and affect  Data Reviewed: I have personally reviewed following labs and imaging studies  CBC: Recent Labs  Lab 07/19/18 0409 07/20/18 0428 07/21/18 0310 07/22/18 0303 07/23/18 0413 07/25/18 0438  WBC 2.6* 2.5* 2.4*  --   --   --  NEUTROABS 1.6*  --   --   --   --   --   HGB 9.2* 8.9* 8.7* 8.5* 9.6* 8.8*  HCT 30.1* 29.3* 30.0* 28.4* 33.2* 29.9*  MCV 80.1 80.5 81.5  --   --   --   PLT 262 238 243  --   --   --    Basic Metabolic Panel: Recent Labs  Lab 07/19/18 0409 07/20/18 0428 07/21/18 0310 07/23/18 0413 07/24/18 0326 07/25/18 0438  NA 137 137 138 138  --  138  K 3.7 3.8 4.2 4.3  --  4.2  CL 102 101 102 101  --  100  CO2 28 28 27 27   --  30  GLUCOSE 136* 133* 125* 111*  --  107*  BUN 12 15 19 13   --  22  CREATININE 0.75 0.84 0.96 0.81 0.94 0.91  CALCIUM 9.3  9.5 9.4 9.7  --  9.5  MG 1.7 1.8  --  2.0  --   --   PHOS 3.2  --   --   --   --   --    GFR: Estimated Creatinine Clearance: 62.4 mL/min (by C-G formula based on SCr of 0.91 mg/dL). Liver Function Tests: Recent Labs  Lab 07/19/18 0409 07/23/18 0413  AST 39  --   ALT 28  --   ALKPHOS 97  --   BILITOT 0.5  --   PROT 6.8  --   ALBUMIN 3.1* 3.3*   No results for input(s): LIPASE, AMYLASE in the last 168 hours. No results for input(s): AMMONIA in the last 168 hours. Coagulation Profile: No results for input(s): INR, PROTIME in the last 168 hours. Cardiac Enzymes: No results for input(s): CKTOTAL, CKMB, CKMBINDEX, TROPONINI in the last 168 hours. BNP (last 3 results) No results for input(s): PROBNP in the last 8760 hours. HbA1C: No results for input(s): HGBA1C in the last 72 hours. CBG: Recent Labs  Lab 07/24/18 0758 07/24/18 1147 07/24/18 1643 07/24/18 2109 07/25/18 0728  GLUCAP 111* 175* 112* 190* 136*   Lipid Profile: No results for input(s): CHOL, HDL, LDLCALC, TRIG, CHOLHDL, LDLDIRECT in the last 72 hours. Thyroid Function Tests: No results for input(s): TSH, T4TOTAL, FREET4, T3FREE, THYROIDAB in the last 72 hours. Anemia Panel: No results for input(s): VITAMINB12, FOLATE, FERRITIN, TIBC, IRON, RETICCTPCT in the last 72 hours. Urine analysis:    Component Value Date/Time   COLORURINE YELLOW 07/14/2018 0914   APPEARANCEUR CLEAR 07/14/2018 0914   LABSPEC 1.013 07/14/2018 0914   PHURINE 6.0 07/14/2018 0914   GLUCOSEU NEGATIVE 07/14/2018 0914   HGBUR NEGATIVE 07/14/2018 0914   BILIRUBINUR NEGATIVE 07/14/2018 0914   KETONESUR 5 (A) 07/14/2018 0914   PROTEINUR NEGATIVE 07/14/2018 0914   UROBILINOGEN 0.2 07/20/2014 1707   NITRITE NEGATIVE 07/14/2018 0914   LEUKOCYTESUR NEGATIVE 07/14/2018 0914   Sepsis Labs: @LABRCNTIP (procalcitonin:4,lacticidven:4)  ) No results found for this or any previous visit (from the past 240 hour(s)).    Radiology Studies: No  results found.   Scheduled Meds: . ARIPiprazole  5 mg Oral Daily  . clonazePAM  0.5 mg Oral QHS  . feeding supplement (ENSURE ENLIVE)  237 mL Oral TID BM  . feeding supplement (PRO-STAT SUGAR FREE 64)  30 mL Oral Daily  . fenofibrate  54 mg Oral Q supper  . fentaNYL  1 patch Transdermal Q72H  . FLUoxetine  60 mg Oral QHS  . gabapentin  200 mg Oral 3 times per day  . heparin  injection (subcutaneous)  5,000 Units Subcutaneous Q8H  . insulin aspart  0-5 Units Subcutaneous QHS  . insulin aspart  0-9 Units Subcutaneous TID WC  . insulin glargine  20 Units Subcutaneous BID  . metoprolol tartrate  12.5 mg Oral TID  . pantoprazole  40 mg Oral Daily  . propylthiouracil  75 mg Oral Daily  . rOPINIRole  3 mg Oral QHS  . senna  2 tablet Oral QHS  . sodium chloride flush  3 mL Intravenous Q12H   Continuous Infusions: . sodium chloride    . vancomycin Stopped (07/24/18 2130)     LOS: 14 days   Time Spent in minutes   25 minutes  Alianis Trimmer D.O. on 07/25/2018 at 8:59 AM  Between 7am to 7pm - Please see pager noted on [email or web address]  After 7pm go to [email or web address]  And look for the night coverage person covering for me after hours  [facility] Office  [phone]

## 2018-07-25 NOTE — Progress Notes (Signed)
Occupational Therapy Treatment Patient Details Name: [name] MRN: [id number] DOB: 1950/08/10 Today's Date: 07/25/2018    History of present illness [name] is a 68 y.o. female with history of diabetes mellitus type 2, sleep apnea, hypertension, Graves' disease, chronic and disease, anemia. Received epidural injection 05/23/2018. She is currently being treated for a lumbar discitis.  Pt admitted with continued back pain and found to have progression of abscess in epidural space of lumbar spine associated with Discitis and Osteomyelitis of L1-L2, and Progressive Abscess in Left Psoas Muscle; possible surgery early next  week    OT comments  Limited by pain.  Made to to EOB briefly, about 10 seconds with mod A.  Educated on toilet aide   Follow Up Recommendations  SNF    Equipment Recommendations  3 in 1 bedside commode    Recommendations for Other Services      Precautions / Restrictions Precautions Precautions: Fall;Back Restrictions Weight Bearing Restrictions: No       Mobility Bed Mobility     Rolling: Min assist;Mod assist Sidelying to sit: Mod assist       General bed mobility comments: assist for trunk to sit up and used rails for rolling   Transfers                 General transfer comment: unable to tolerate    Balance                                           ADL either performed or assessed with clinical judgement   ADL                                         General ADL Comments: pt attempted to get up: only made it to EOB with modA, stating that she had to lie down, that back was 10/10 pain.  Showed toilet aide this session     Vision       Perception     Praxis      Cognition Arousal/Alertness: Awake/alert Behavior During Therapy: Anxious Overall Cognitive Status: Within Functional Limits for tasks assessed                                          Exercises      Shoulder Instructions       General Comments      Pertinent Vitals/ Pain       Pain Assessment: 0-10 Pain Score: 10-Worst pain ever Pain Location: back Pain Descriptors / Indicators: Radiating;Sharp Pain Intervention(s): Limited activity within patient's tolerance;Monitored during session;Repositioned;Premedicated before session  Home Living                                          Prior Functioning/Environment              Frequency  Min 2X/week        Progress Toward Goals  OT Goals(current goals can now be found in the care plan section)  Progress towards OT goals: Not progressing toward goals - comment(limited by pain  today)     Plan      Co-evaluation                 AM-PAC OT "6 Clicks" Daily Activity     Outcome Measure   Help from another person eating meals?: A Little Help from another person taking care of personal grooming?: A Little Help from another person toileting, which includes using toliet, bedpan, or urinal?: A Lot Help from another person bathing (including washing, rinsing, drying)?: A Little Help from another person to put on and taking off regular upper body clothing?: A Lot Help from another person to put on and taking off regular lower body clothing?: Total 6 Click Score: 14    End of Session    OT Visit Diagnosis: Pain;Muscle weakness (generalized) (M62.81) Pain - Right/Left: Left Pain - part of body: Hip   Activity Tolerance Patient limited by pain   Patient Left in chair;with call bell/phone within reach;with chair alarm set   Nurse Communication          Time: 3202-3343 OT Time Calculation (min): 21 min  Charges: OT General Charges $OT Visit: 1 Visit OT Treatments $Therapeutic Activity: 8-22 mins  Lesle Chris, OTR/L Acute Rehabilitation Services [phone] [facility] pager [phone] office 07/25/2018   [place] 07/25/2018, 11:19 AM
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
## 2018-07-26 ENCOUNTER — Inpatient Hospital Stay (HOSPITAL_COMMUNITY): Payer: Medicare Other

## 2018-07-26 LAB — GLUCOSE, CAPILLARY
Glucose-Capillary: 95 mg/dL (ref 70–99)
Glucose-Capillary: 133 mg/dL — ABNORMAL HIGH (ref 70–99)
Glucose-Capillary: 104 mg/dL — ABNORMAL HIGH (ref 70–99)
Glucose-Capillary: 125 mg/dL — ABNORMAL HIGH (ref 70–99)

## 2018-07-26 MED ORDER — GABAPENTIN 300 MG PO CAPS
300.0000 mg | ORAL_CAPSULE | ORAL | Status: DC
  Administered 2018-07-26 – 2018-07-31 (×14): 300 mg via ORAL
  Filled 2018-07-26 (×14): qty 1

## 2018-07-26 MED ORDER — OXYCODONE HCL 5 MG PO TABS
10.0000 mg | ORAL_TABLET | ORAL | Status: DC | PRN
  Administered 2018-07-26 – 2018-07-28 (×8): 10 mg via ORAL
  Administered 2018-07-29 – 2018-07-31 (×8): 15 mg via ORAL
  Administered 2018-07-31 (×3): 10 mg via ORAL
  Filled 2018-07-26: qty 2
  Filled 2018-07-26: qty 3
  Filled 2018-07-26: qty 2
  Filled 2018-07-26 (×4): qty 3
  Filled 2018-07-26 (×4): qty 2
  Filled 2018-07-26: qty 3
  Filled 2018-07-26 (×4): qty 2
  Filled 2018-07-26: qty 3
  Filled 2018-07-26: qty 2
  Filled 2018-07-26: qty 3
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
## 2018-07-26 NOTE — Progress Notes (Signed)
Physical Therapy Treatment Patient Details Name: [name] MRN: [id number] DOB: [birth year] Today's Date: 07/26/2018    History of Present Illness [name] is a 68 y.o. female with history of diabetes mellitus type 2, sleep apnea, hypertension, Graves' disease, chronic and disease, anemia. Received epidural injection 05/23/2018. She is currently being treated for a lumbar discitis.  Pt admitted with continued back pain and found to have progression of abscess in epidural space of lumbar spine associated with Discitis and Osteomyelitis of L1-L2, and Progressive Abscess in Left Psoas Muscle; possible surgery early next  week     PT Comments    Pt not progressing towards her physical therapy goals secondary to pain. Requiring moderate assistance for bed mobility. Able to stand without physical assistance but abruptly sat on edge of bed and laid down secondary to pain. Pt with flat affect and confusion, stating, "How do I get out of this chair," when she was in the bed. Pt not receptive to pain management education in regards to mobility. Continue to recommend SNF for ongoing Physical Therapy.        Follow Up Recommendations  SNF;Supervision/Assistance - 24 hour     Equipment Recommendations  None recommended by PT    Recommendations for Other Services       Precautions / Restrictions Precautions Precautions: Fall;Back Restrictions Weight Bearing Restrictions: No    Mobility  Bed Mobility Overal bed mobility: Needs Assistance Bed Mobility: Rolling;Sidelying to Sit Rolling: Mod assist Sidelying to sit: Mod assist       General bed mobility comments: cues for use of bed rail to assist roll. moderate assistance for trunk elevation with tactile cueing to bring BLE's off edge of bed  Transfers Overall transfer level: Needs assistance Equipment used: None Transfers: Sit to/from Stand Sit to Stand: Min guard         General transfer comment: min guard for rise to  stand. unable to tolerate standing > 5 seconds due to pain  Ambulation/Gait                 Stairs             Wheelchair Mobility    Modified Rankin (Stroke Patients Only)       Balance Overall balance assessment: Needs assistance Sitting-balance support: Bilateral upper extremity supported;Feet supported Sitting balance-Leahy Scale: Fair     Standing balance support: No upper extremity supported;During functional activity Standing balance-Leahy Scale: Fair                              Cognition Arousal/Alertness: Awake/alert Behavior During Therapy: Flat affect Overall Cognitive Status: Impaired/Different from baseline Area of Impairment: Awareness                           Awareness: Intellectual          Exercises      General Comments        Pertinent Vitals/Pain Pain Assessment: Faces Faces Pain Scale: Hurts even more Pain Location: back Pain Descriptors / Indicators: Radiating;Sharp Pain Intervention(s): Limited activity within patient's tolerance;Monitored during session;Repositioned;Premedicated before session    Home Living                      Prior Function            PT Goals (current goals can now be found in the  care plan section) Acute Rehab PT Goals Patient Stated Goal: less pain Potential to Achieve Goals: Fair    Frequency    Min 3X/week      PT Plan Current plan remains appropriate    Co-evaluation              AM-PAC PT "6 Clicks" Mobility   Outcome Measure  Help needed turning from your back to your side while in a flat bed without using bedrails?: A Lot Help needed moving from lying on your back to sitting on the side of a flat bed without using bedrails?: A Lot Help needed moving to and from a bed to a chair (including a wheelchair)?: A Lot Help needed standing up from a chair using your arms (e.g., wheelchair or bedside chair)?: A Little Help needed to walk in  hospital room?: A Lot Help needed climbing 3-5 steps with a railing? : Total 6 Click Score: 12    End of Session   Activity Tolerance: Patient limited by pain Patient left: in bed;with call bell/phone within reach Nurse Communication: Mobility status PT Visit Diagnosis: Other abnormalities of gait and mobility (R26.89)     Time: 3762-8315 PT Time Calculation (min) (ACUTE ONLY): 12 min  Charges:  $Therapeutic Activity: 8-22 mins                    Ellamae Sia, PT, DPT Acute Rehabilitation Services Pager [phone] Office [phone]    Willy Eddy 07/26/2018, 4:52 PM

## 2018-07-26 NOTE — Progress Notes (Signed)
Patient ID: VIOLETTA LAVALLE, female   DOB: 08/15/1950, 68 y.o.   MRN: [id number] Came by the talk to Mrs. Gunawan today.  She notes that the worst pain she has is and is in the left lower quadrant noted in the notes of physical therapy that she has been progressing poorly and pain management is an issue surgery is planned for this Friday and I will order a CT scan from T8-L3 for robotic planning.  I discussed the surgery with her and her husband by phone and will stop by tomorrow evening to discuss any issues that may come up on the CT scan.

## 2018-07-26 NOTE — Progress Notes (Signed)
PROGRESS NOTE    [name]  DJT:[id number] DOB: [birth year] DOA: 07/11/2018 PCP: Crist Infante, MD    Brief Narrative:  Admitted with abscess in the epidural space of lumbar spine associated with discitis and osteomyelitis.  Currently will require 4 weeks of IV vancomycin.  Plan of care consulted for pain management.  Neurosurgery currently following patient, with tentative plan for surgery next week, 07/28/18. Patient will be transferred to [facility].  Assessment & Plan:   Principal Problem:   Abscess in epidural space of lumbar spine Active Problems:   DM (diabetes mellitus) (HCC)   Hypothyroidism   Obesity   Graves disease   Pain   Palliative care by specialist  Abscess in epidural space of lumbar spine associated with discitis and osteomyelitis of L1-L2/myositis and bilateral psoas muscles, progressive abscess in left psoas muscle -Interventional radiology had been consulted and appreciated-wanted infectious disease and neurosurgery input -Neurosurgery initially consulted and advised that repeat needle biopsy of the elbow wound would be preferable to an open surgical decompression -Infectious disease consulted and appreciated, recommended drainage of the abscess and holding antibiotics pending culture, and then starting vancomycin- plan to continue through 08/12/2018 with reimaging after 4 weeks -Status post underwent CT aspiration but there was no significant drainage, only a few drops of blood aspirated from left psoas site -Blood culture showed no growth to date -Aerobic and anaerobic cultures and Gram stain showed no organisms -Case was rediscussed with neurosurgery, Dr. Zada Finders, felt that no surgical recommendation was warranted at this time and recommended prolonged course of antibiotics -Continue pain control, palliative care was consulted for symptom management -PT and OT recommended SNF placement -Patient and husband initially would like for her to return home. Dr.  Ree Kida discussed with husband on 07/19/2018 regarding discharge and SNF. Both patient and husband are opened to SNF placement. -Later discussed with neurosurgery, Dr. Ellene Route, obtained CT scan. He will discuss with the patient and husband possibility of surgery next week to help stabilize spine and clean out.  Tentative surgery 07/28/2018 which will need to occur at [facility].  Patient states she is leaning more towards moving forward with surgery as she feels this will give her a better quality of life. -have consulted nutrition in anticipation of surgery- continue supplemenation  Diabetes mellitus, type II -Continue Lantus, insulin sliding scale with CBG monitoring -Stable at present  Essential hypertension -Continue metoprolol as tolerated  Graves' disease -Continue PTU as tolerated  Fibromyalgia/depression/restless leg syndrome -Continue aripiprazole, fluoxetine, clonazepam, ropinirole -Seems stable at present  Chronic pain syndrome -Continue gabapentin, flexeril, toradol, fentanyl patch, as needed oxycodone as well as Dilaudid -Palliative care consulted and appreciated for pain control -Stable  Hyperlipidemia -Continue fenofibrate -Stable  Normocytic anemia -Anemia panel showed iron of 27, U IBC 247, TIBC 274, saturation ratio of 10%, ferritin 28, folate 15.7, B12 2063 -Stable, hemoglobin 8.8, baseline appears between 8 and 9 -Hemodynamically stable  Hypokalemia -Resovled, continue to monitor  -repeat BMET  Hyponatremia -Resolved, continue to monitor  Oral candidiasis/thrush -Appears to be improving -Nurse had reported white patches on patient's tongue- currently appears to be clear and dry appearing -Completed course of fluconazole and nystatin -Presently stable  Obesity, morbid -BMI of 37.79 -Recommend diet/lifestyle modification  DVT prophylaxis: heparin subQ Code Status: Full Family Communication: Pt in room, family not at  bedside Disposition Plan: SNF when stable  Consultants:  Infectious disease Interventional radiology Neurosurgery  Procedures:     Antimicrobials: Anti-infectives (From admission, onward)  Start     Dose/Rate Route Frequency Ordered Stop   07/22/18 1800  vancomycin (VANCOCIN) 1,500 mg in sodium chloride 0.9 % 500 mL IVPB     1,500 mg 250 mL/hr over 120 Minutes Intravenous Every 24 hours 07/21/18 1804     07/17/18 1600  vancomycin (VANCOCIN) 1,750 mg in sodium chloride 0.9 % 500 mL IVPB  Status:  Discontinued     1,750 mg 250 mL/hr over 120 Minutes Intravenous Every 24 hours 07/17/18 1541 07/21/18 1804   07/17/18 1300  fluconazole (DIFLUCAN) tablet 200 mg  Status:  Discontinued     200 mg Oral Every 24 hours 07/16/18 1338 07/21/18 1043   07/15/18 1400  fluconazole (DIFLUCAN) IVPB 200 mg  Status:  Discontinued     200 mg 100 mL/hr over 60 Minutes Intravenous Every 24 hours 07/14/18 1322 07/16/18 1338   07/14/18 1400  vancomycin (VANCOCIN) 1,000 mg in sodium chloride 0.9 % 250 mL IVPB  Status:  Discontinued     1,000 mg 250 mL/hr over 60 Minutes Intravenous Every 24 hours 07/13/18 1304 07/14/18 0752   07/14/18 1400  vancomycin (VANCOCIN) 1,250 mg in sodium chloride 0.9 % 250 mL IVPB  Status:  Discontinued     1,250 mg 166.7 mL/hr over 90 Minutes Intravenous Every 24 hours 07/14/18 0752 07/17/18 1541   07/14/18 1400  fluconazole (DIFLUCAN) IVPB 200 mg     200 mg 100 mL/hr over 60 Minutes Intravenous  Once 07/14/18 1303 07/14/18 1549   07/13/18 1345  vancomycin (VANCOCIN) 2,000 mg in sodium chloride 0.9 % 500 mL IVPB     2,000 mg 250 mL/hr over 120 Minutes Intravenous  Once 07/13/18 1301 07/13/18 1615       Subjective: Anxious about upcoming surgery  Objective: Vitals:   07/25/18 2023 07/25/18 2335 07/26/18 0349 07/26/18 0809  BP: (!) 130/47 124/75 (!) 156/71 130/61  Pulse: 90 (!) 101 95 94  Resp:    16  Temp: 98.7 F (37.1 C) 98.1 F (36.7 C) 98.2 F (36.8 C) 98.3  F (36.8 C)  TempSrc: Oral Oral Oral Oral  SpO2: 92% 96% 92% 96%  Weight:      Height:        Intake/Output Summary (Last 24 hours) at 07/26/2018 1551 Last data filed at 07/26/2018 1030 Gross per 24 hour  Intake 120 ml  Output 600 ml  Net -480 ml   Filed Weights   07/11/18 1218  Weight: 90.7 kg    Examination:  General exam: Appears comfortable, tearful when discussing her surgery Respiratory system: Clear to auscultation. Respiratory effort normal. Cardiovascular system: S1 & S2 heard, RRR Gastrointestinal system: Abdomen is nondistended, soft and nontender. No organomegaly or masses felt. Normal bowel sounds heard. Central nervous system: Alert and oriented. No focal neurological deficits. Extremities: Symmetric 5 x 5 power. Skin: No rashes, lesions  Psychiatry: appears anxious, tearful.   Data Reviewed: I have personally reviewed following labs and imaging studies  CBC: Recent Labs  Lab 07/20/18 0428 07/21/18 0310 07/22/18 0303 07/23/18 0413 07/25/18 0438  WBC 2.5* 2.4*  --   --   --   HGB 8.9* 8.7* 8.5* 9.6* 8.8*  HCT 29.3* 30.0* 28.4* 33.2* 29.9*  MCV 80.5 81.5  --   --   --   PLT 238 243  --   --   --    Basic Metabolic Panel: Recent Labs  Lab 07/20/18 0428 07/21/18 0310 07/23/18 0413 07/24/18 0326 07/25/18 0438  NA 137 138 138  --  138  K 3.8 4.2 4.3  --  4.2  CL 101 102 101  --  100  CO2 28 27 27   --  30  GLUCOSE 133* 125* 111*  --  107*  BUN 15 19 13   --  22  CREATININE 0.84 0.96 0.81 0.94 0.91  CALCIUM 9.5 9.4 9.7  --  9.5  MG 1.8  --  2.0  --   --    GFR: Estimated Creatinine Clearance: 62.4 mL/min (by C-G formula based on SCr of 0.91 mg/dL). Liver Function Tests: Recent Labs  Lab 07/23/18 0413  ALBUMIN 3.3*   No results for input(s): LIPASE, AMYLASE in the last 168 hours. No results for input(s): AMMONIA in the last 168 hours. Coagulation Profile: No results for input(s): INR, PROTIME in the last 168 hours. Cardiac Enzymes: No  results for input(s): CKTOTAL, CKMB, CKMBINDEX, TROPONINI in the last 168 hours. BNP (last 3 results) No results for input(s): PROBNP in the last 8760 hours. HbA1C: No results for input(s): HGBA1C in the last 72 hours. CBG: Recent Labs  Lab 07/25/18 1209 07/25/18 1634 07/25/18 2140 07/26/18 0802 07/26/18 1128  GLUCAP 163* 112* 197* 95 133*   Lipid Profile: No results for input(s): CHOL, HDL, LDLCALC, TRIG, CHOLHDL, LDLDIRECT in the last 72 hours. Thyroid Function Tests: No results for input(s): TSH, T4TOTAL, FREET4, T3FREE, THYROIDAB in the last 72 hours. Anemia Panel: No results for input(s): VITAMINB12, FOLATE, FERRITIN, TIBC, IRON, RETICCTPCT in the last 72 hours. Sepsis Labs: No results for input(s): PROCALCITON, LATICACIDVEN in the last 168 hours.  No results found for this or any previous visit (from the past 240 hour(s)).   Radiology Studies: No results found.  Scheduled Meds:  ARIPiprazole  5 mg Oral Daily   clonazePAM  0.5 mg Oral QHS   feeding supplement (ENSURE ENLIVE)  237 mL Oral TID BM   feeding supplement (PRO-STAT SUGAR FREE 64)  30 mL Oral Daily   fenofibrate  54 mg Oral Q supper   fentaNYL  1 patch Transdermal Q72H   FLUoxetine  60 mg Oral QHS   gabapentin  300 mg Oral 3 times per day   heparin injection (subcutaneous)  5,000 Units Subcutaneous Q8H   insulin aspart  0-5 Units Subcutaneous QHS   insulin aspart  0-9 Units Subcutaneous TID WC   insulin glargine  20 Units Subcutaneous BID   metoprolol tartrate  12.5 mg Oral TID   pantoprazole  40 mg Oral Daily   propylthiouracil  75 mg Oral Daily   rOPINIRole  3 mg Oral QHS   senna  2 tablet Oral QHS   sodium chloride flush  3 mL Intravenous Q12H   Continuous Infusions:  sodium chloride 250 mL (07/25/18 1836)   vancomycin 1,500 mg (07/25/18 1847)     LOS: 15 days   Marylu Lund, MD [facility] Pager On Amion  If 7PM-7AM, please contact night-coverage 07/26/2018, 3:51  PM

## 2018-07-26 NOTE — Progress Notes (Signed)
Pharmacy Antibiotic Note  [name] is a 68 y.o. female with lumbar discitis caused by methicillin resistant CoNS in March 2020 and was on vancomycin PTA. Vancomycin course stated 06/03/18 with a planned 6 week duration, expected to end on 07/16/18. Most recent regimen was vancomycin 1000 mg IV q24h. Last dose of vancomycin PTA was 07/10/18, antibiotics were held on admission and resumed on 4/23.  With limited drainage from CT aspiration, ID recommends treating for another 4 weeks with abx (treat thru 5/23).  Today, 07/26/2018: Scr remains stable and within normal limits. Afebrile. No recent CBC.  Plan for surgery later this week - possibly 5/8.   Plan: Continue vancomycin to 1500 mg IV q24 hr  Monitor renal function, culture results, and clinical status  Height: 5\' 1"  (154.9 cm) Weight: 200 lb (90.7 kg) IBW/kg (Calculated) : 47.8  Temp (24hrs), Avg:98.5 F (36.9 C), Min:98.1 F (36.7 C), Max:98.8 F (37.1 C)  Recent Labs  Lab 07/20/18 0428 07/21/18 0310 07/21/18 1726 07/23/18 0413 07/24/18 0326 07/25/18 0438 07/25/18 1847  WBC 2.5* 2.4*  --   --   --   --   --   CREATININE 0.84 0.96  --  0.81 0.94 0.91  --   VANCOTROUGH  --   --  20  --   --   --  17    Estimated Creatinine Clearance: 62.4 mL/min (by C-G formula based on SCr of 0.91 mg/dL).    Allergies  Allergen Reactions  . Imitrex [Sumatriptan] Other (See Comments)    Vascular spasms  . Nsaids Other (See Comments)    PT UNABLE TO TOLERATE NSAID'S DRUGS DUE TO HX OF GASTRIC SLEEVE SURGERY  . Codeine Nausea Only  . Promethazine Hcl Other (See Comments)    Restless leg feeling all over body  . Rosuvastatin Calcium Other (See Comments)    Leg/muscle pain  . Statins Other (See Comments)    Leg/muscle pain  . Sulfonamide Derivatives Hives    Childhood allergy   . Vicodin [Hydrocodone-Acetaminophen] Nausea And Vomiting    Antimicrobials this admission: Prior to admission: Vancomycin 06/03/18 > 07/10/18 (planned  stop 4/26) --> but now plan to cont with another prolonged course of abx  Vancomycin 4/23 >> Fluconazole 4/24 ( oropharyngeal candidiasis) >>4/30  Dose adjustment: 4/27 at 1345 VT= 10 --> changed to 1750 mg IV q24h 5/1 VT = 20 (with SCr 0.96); reduce to 1500 q24 5/5 VT = 17 (with SCr 0.91); continue 1500 q24  Microbiology results: 4/21 BCx: neg FINAL 4/23 abscess, L psoas:  neg FINAL  Previous recent cultures: 3/17 Urine: >100K pseudomonas, proteus 3/14 aspiration of L1,L2 disc space: abundant staphylococcus epidermidis (oxacillin resistant)  Thank you for allowing pharmacy to be a part of this patient's care.  Sloan Leiter, PharmD, BCPS, BCCCP Clinical Pharmacist Please refer to [facility] for [place] numbers 07/26/2018 10:43 AM

## 2018-07-27 LAB — GLUCOSE, CAPILLARY
Glucose-Capillary: 123 mg/dL — ABNORMAL HIGH (ref 70–99)
Glucose-Capillary: 106 mg/dL — ABNORMAL HIGH (ref 70–99)
Glucose-Capillary: 156 mg/dL — ABNORMAL HIGH (ref 70–99)
Glucose-Capillary: 128 mg/dL — ABNORMAL HIGH (ref 70–99)

## 2018-07-27 MED ORDER — CHLORHEXIDINE GLUCONATE CLOTH 2 % EX PADS
6.0000 | MEDICATED_PAD | Freq: Once | CUTANEOUS | Status: AC
  Administered 2018-07-28: 6 via TOPICAL

## 2018-07-27 MED ORDER — CEFAZOLIN SODIUM-DEXTROSE 2-4 GM/100ML-% IV SOLN
2.0000 g | INTRAVENOUS | Status: DC
  Filled 2018-07-27 (×2): qty 100

## 2018-07-27 MED ORDER — CHLORHEXIDINE GLUCONATE CLOTH 2 % EX PADS
6.0000 | MEDICATED_PAD | Freq: Once | CUTANEOUS | Status: AC
  Administered 2018-07-27: 6 via TOPICAL

## 2018-07-27 NOTE — Progress Notes (Signed)
Physical Therapy Treatment Patient Details Name: [name] MRN: [id number] DOB: 03/26/1950 Today's Date: 07/27/2018    History of Present Illness [name] is a 68 y.o. female with history of diabetes mellitus type 2, sleep apnea, hypertension, Graves' disease, chronic and disease, anemia. Received epidural injection 05/23/2018. She is currently being treated for a lumbar discitis.  Pt admitted with continued back pain and found to have progression of abscess in epidural space of lumbar spine associated with Discitis and Osteomyelitis of L1-L2, and Progressive Abscess in Left Psoas Muscle; possible surgery early next  week     PT Comments    Pt continues to be significantly limited by left lower back pain. Extensive education provided prior to mobility concerning importance of mobility, pain neuroscience, and expectations pre and post surgery.  Pt requiring moderate assistance for bed mobility, then began screaming once upright, abruptly throwing herself back down onto the bed. Rest of session focused on bed level therapeutic exercises for lower extremity strengthening.    Follow Up Recommendations  SNF;Supervision/Assistance - 24 hour     Equipment Recommendations  None recommended by PT    Recommendations for Other Services       Precautions / Restrictions Precautions Precautions: Fall;Back Restrictions Weight Bearing Restrictions: No    Mobility  Bed Mobility Overal bed mobility: Needs Assistance Bed Mobility: Rolling;Sidelying to Sit Rolling: Mod assist Sidelying to sit: Mod assist       General bed mobility comments: cues for use of bed rail to assist roll. moderate assistance for trunk elevation with tactile cueing to bring BLE's off edge of bed  Transfers                    Ambulation/Gait                 Stairs             Wheelchair Mobility    Modified Rankin (Stroke Patients Only)       Balance Overall balance assessment:  Needs assistance Sitting-balance support: Bilateral upper extremity supported;Feet supported Sitting balance-Leahy Scale: Fair                                      Cognition Arousal/Alertness: Awake/alert Behavior During Therapy: Flat affect Overall Cognitive Status: Impaired/Different from baseline Area of Impairment: Awareness                           Awareness: Intellectual          Exercises General Exercises - Lower Extremity Heel Slides: 10 reps;Both;Supine Hip ABduction/ADduction: 10 reps;Both;Supine Straight Leg Raises: 10 reps;Both;Supine    General Comments        Pertinent Vitals/Pain Pain Assessment: Faces Faces Pain Scale: Hurts whole lot Pain Location: screaming with left lower back pain upon rise to sitting Pain Descriptors / Indicators: Radiating;Sharp Pain Intervention(s): Monitored during session;Limited activity within patient's tolerance;Premedicated before session    Home Living                      Prior Function            PT Goals (current goals can now be found in the care plan section) Acute Rehab PT Goals Patient Stated Goal: less pain Potential to Achieve Goals: Fair Progress towards PT goals: Not progressing toward goals - comment(limited  by pain)    Frequency    Min 3X/week      PT Plan Current plan remains appropriate    Co-evaluation              AM-PAC PT "6 Clicks" Mobility   Outcome Measure  Help needed turning from your back to your side while in a flat bed without using bedrails?: A Lot Help needed moving from lying on your back to sitting on the side of a flat bed without using bedrails?: A Lot Help needed moving to and from a bed to a chair (including a wheelchair)?: A Lot Help needed standing up from a chair using your arms (e.g., wheelchair or bedside chair)?: A Little Help needed to walk in hospital room?: A Lot Help needed climbing 3-5 steps with a railing? :  Total 6 Click Score: 12    End of Session   Activity Tolerance: Patient limited by pain Patient left: in bed;with call bell/phone within reach Nurse Communication: Mobility status PT Visit Diagnosis: Other abnormalities of gait and mobility (R26.89)     Time: 1000-1013 PT Time Calculation (min) (ACUTE ONLY): 13 min  Charges:  $Therapeutic Exercise: 8-22 mins                     Ellamae Sia, PT, DPT Acute Rehabilitation Services Pager [phone] Office [phone]    Willy Eddy 07/27/2018, 12:06 PM

## 2018-07-27 NOTE — Progress Notes (Signed)
PROGRESS NOTE    [name]  KYH:[id number] DOB: 02/19/1951 DOA: 07/11/2018 PCP: Crist Infante, MD    Brief Narrative:  Admitted with abscess in the epidural space of lumbar spine associated with discitis and osteomyelitis.  Currently will require 4 weeks of IV vancomycin.  Plan of care consulted for pain management.  Neurosurgery currently following patient, with tentative plan for surgery next week, 07/28/18. Patient will be transferred to [facility].  Assessment & Plan:   Principal Problem:   Abscess in epidural space of lumbar spine Active Problems:   DM (diabetes mellitus) (HCC)   Hypothyroidism   Obesity   Graves disease   Pain   Palliative care by specialist  Abscess in epidural space of lumbar spine associated with discitis and osteomyelitis of L1-L2/myositis and bilateral psoas muscles, progressive abscess in left psoas muscle -Interventional radiology had been consulted and appreciated-wanted infectious disease and neurosurgery input -Neurosurgery initially consulted and advised that repeat needle biopsy of the elbow wound would be preferable to an open surgical decompression -Infectious disease consulted and appreciated, recommended drainage of the abscess and holding antibiotics pending culture, and then starting vancomycin- plan to continue through 08/12/2018 with reimaging after 4 weeks -Status post underwent CT aspiration but there was no significant drainage, only a few drops of blood aspirated from left psoas site -Blood culture showed no growth to date -Aerobic and anaerobic cultures and Gram stain showed no organisms -Case was rediscussed with neurosurgery, Dr. Zada Finders, felt that no surgical recommendation was warranted at this time and recommended prolonged course of antibiotics -Continue pain control, palliative care was consulted for symptom management -PT and OT recommended SNF placement -Patient and husband initially would like for her to return home. Dr.  Ree Kida discussed with husband on 07/19/2018 regarding discharge and SNF. Both patient and husband are opened to SNF placement. -Later discussed with neurosurgery, Dr. Ellene Route, obtained CT scan. He will discuss with the patient and husband possibility of surgery next week to help stabilize spine and clean out.  Tentative surgery 07/28/2018 which will need to occur at [facility].  Patient states she is leaning more towards moving forward with surgery as she feels this will give her a better quality of life. -Nutrition was consulted in anticipation of surgery- continue supplemenation  Diabetes mellitus, type II -Continue Lantus, insulin sliding scale with CBG monitoring -Stable at present  Essential hypertension -Continue metoprolol as tolerated -BP stable  Graves' disease -Continue PTU as patient tolerates  Fibromyalgia/depression/restless leg syndrome -Continue aripiprazole, fluoxetine, clonazepam, ropinirole -Currently stable  Chronic pain syndrome -Continue gabapentin, flexeril, toradol, fentanyl patch, as needed oxycodone as well as Dilaudid -Palliative care consulted and appreciated for pain control -Stable at this time  Hyperlipidemia -Continue fenofibrate -Stable  Normocytic anemia -Anemia panel showed iron of 27, U IBC 247, TIBC 274, saturation ratio of 10%, ferritin 28, folate 15.7, B12 2063 -Stable, hemoglobin 8.8, baseline appears between 8 and 9 -Hemodynamically stable presently  Hypokalemia -Resovled, continue to monitor  -repeat BMET  Hyponatremia -Resolved, continue to monitor  Oral candidiasis/thrush -Appears to be improving -Nurse had reported white patches on patient's tongue- currently appears to be clear and dry appearing -Completed course of fluconazole and nystatin -Currently stable  Obesity, morbid -BMI of 37.79 -Recommend diet/lifestyle modification  DVT prophylaxis: heparin subQ Code Status: Full Family Communication: Pt in  room, family not at bedside Disposition Plan: SNF when stable  Consultants:  Infectious disease Interventional radiology Neurosurgery  Procedures:     Antimicrobials: Anti-infectives (  From admission, onward)   Start     Dose/Rate Route Frequency Ordered Stop   07/22/18 1800  vancomycin (VANCOCIN) 1,500 mg in sodium chloride 0.9 % 500 mL IVPB     1,500 mg 250 mL/hr over 120 Minutes Intravenous Every 24 hours 07/21/18 1804     07/17/18 1600  vancomycin (VANCOCIN) 1,750 mg in sodium chloride 0.9 % 500 mL IVPB  Status:  Discontinued     1,750 mg 250 mL/hr over 120 Minutes Intravenous Every 24 hours 07/17/18 1541 07/21/18 1804   07/17/18 1300  fluconazole (DIFLUCAN) tablet 200 mg  Status:  Discontinued     200 mg Oral Every 24 hours 07/16/18 1338 07/21/18 1043   07/15/18 1400  fluconazole (DIFLUCAN) IVPB 200 mg  Status:  Discontinued     200 mg 100 mL/hr over 60 Minutes Intravenous Every 24 hours 07/14/18 1322 07/16/18 1338   07/14/18 1400  vancomycin (VANCOCIN) 1,000 mg in sodium chloride 0.9 % 250 mL IVPB  Status:  Discontinued     1,000 mg 250 mL/hr over 60 Minutes Intravenous Every 24 hours 07/13/18 1304 07/14/18 0752   07/14/18 1400  vancomycin (VANCOCIN) 1,250 mg in sodium chloride 0.9 % 250 mL IVPB  Status:  Discontinued     1,250 mg 166.7 mL/hr over 90 Minutes Intravenous Every 24 hours 07/14/18 0752 07/17/18 1541   07/14/18 1400  fluconazole (DIFLUCAN) IVPB 200 mg     200 mg 100 mL/hr over 60 Minutes Intravenous  Once 07/14/18 1303 07/14/18 1549   07/13/18 1345  vancomycin (VANCOCIN) 2,000 mg in sodium chloride 0.9 % 500 mL IVPB     2,000 mg 250 mL/hr over 120 Minutes Intravenous  Once 07/13/18 1301 07/13/18 1615      Subjective: Feeling more nervous about upcoming surgery tomorrow  Objective: Vitals:   07/26/18 2300 07/27/18 0400 07/27/18 0804 07/27/18 1202  BP: (!) 150/64 (!) 154/61 139/76 129/62  Pulse: (!) 103 (!) 103 (!) 101 92  Resp:   16 18  Temp: 97.9 F  (36.6 C) 98.7 F (37.1 C) 98.2 F (36.8 C) 98.1 F (36.7 C)  TempSrc: Oral Oral Oral Oral  SpO2: 99% 99% 92% 95%  Weight:      Height:        Intake/Output Summary (Last 24 hours) at 07/27/2018 1207 Last data filed at 07/27/2018 0830 Gross per 24 hour  Intake 240 ml  Output 1300 ml  Net -1060 ml   Filed Weights   07/11/18 1218  Weight: 90.7 kg    Examination: General exam: Awake, laying in bed, in nad Respiratory system: Normal respiratory effort, no wheezing Cardiovascular system: regular rate, s1, s2 Gastrointestinal system: Soft, nondistended, positive BS Central nervous system: CN2-12 grossly intact, strength intact Extremities: Perfused, no clubbing Skin: Normal skin turgor, no notable skin lesions seen Psychiatry: appears anxious // no visual hallucinations   Data Reviewed: I have personally reviewed following labs and imaging studies  CBC: Recent Labs  Lab 07/21/18 0310 07/22/18 0303 07/23/18 0413 07/25/18 0438  WBC 2.4*  --   --   --   HGB 8.7* 8.5* 9.6* 8.8*  HCT 30.0* 28.4* 33.2* 29.9*  MCV 81.5  --   --   --   PLT 243  --   --   --    Basic Metabolic Panel: Recent Labs  Lab 07/21/18 0310 07/23/18 0413 07/24/18 0326 07/25/18 0438  NA 138 138  --  138  K 4.2 4.3  --  4.2  CL 102 101  --  100  CO2 27 27  --  30  GLUCOSE 125* 111*  --  107*  BUN 19 13  --  22  CREATININE 0.96 0.81 0.94 0.91  CALCIUM 9.4 9.7  --  9.5  MG  --  2.0  --   --    GFR: Estimated Creatinine Clearance: 62.4 mL/min (by C-G formula based on SCr of 0.91 mg/dL). Liver Function Tests: Recent Labs  Lab 07/23/18 0413  ALBUMIN 3.3*   No results for input(s): LIPASE, AMYLASE in the last 168 hours. No results for input(s): AMMONIA in the last 168 hours. Coagulation Profile: No results for input(s): INR, PROTIME in the last 168 hours. Cardiac Enzymes: No results for input(s): CKTOTAL, CKMB, CKMBINDEX, TROPONINI in the last 168 hours. BNP (last 3 results) No results for  input(s): PROBNP in the last 8760 hours. HbA1C: No results for input(s): HGBA1C in the last 72 hours. CBG: Recent Labs  Lab 07/26/18 0802 07/26/18 1128 07/26/18 1645 07/26/18 2121 07/27/18 0704  GLUCAP 95 133* 104* 125* 123*   Lipid Profile: No results for input(s): CHOL, HDL, LDLCALC, TRIG, CHOLHDL, LDLDIRECT in the last 72 hours. Thyroid Function Tests: No results for input(s): TSH, T4TOTAL, FREET4, T3FREE, THYROIDAB in the last 72 hours. Anemia Panel: No results for input(s): VITAMINB12, FOLATE, FERRITIN, TIBC, IRON, RETICCTPCT in the last 72 hours. Sepsis Labs: No results for input(s): PROCALCITON, LATICACIDVEN in the last 168 hours.  No results found for this or any previous visit (from the past 240 hour(s)).   Radiology Studies: Ct Thoracic Spine Wo Contrast  Result Date: 07/26/2018 CLINICAL DATA:  68 y/o F; pre-surgical evaluation, robotic protocol, imaging to include T8-L4. L1-2 discitis osteomyelitis. EXAM: CT THORACIC SPINE WITHOUT CONTRAST TECHNIQUE: Multidetector CT images of the thoracic and lumbar spine from T6-L5 were obtained using the standard protocol without intravenous contrast. COMPARISON:  07/20/2018 CT lumbar spine. 07/11/2018 lumbar spine MRI. 01/06/2017 CT chest. FINDINGS: Alignment: L5-S1 grade 1 anterolisthesis. Vertebrae: Stable findings of severe discitis osteomyelitis at the L1-2 level with opposing endplate destruction and widening of the intervertebral disc space. There is a ventral epidural collection which is narrowing the spinal canal better characterized on the prior lumbar spine MRI. Bony fragments extend into the adjacent psoas muscles with there are phlegmon/abscess, suboptimally assessed in the absence of intravenous contrast. No new level of fracture or malalignment. Paraspinal and other soft tissues: Cholecystectomy. Bilateral nonobstructive nephrolithiasis. Aortic calcific atherosclerosis. Disc levels: Multilevel discogenic degenerative changes of  the thoracic spine with loss of intervertebral disc space height, small disc displacements, and endplate marginal osteophytes. Spondylosis of the lumbar spine results in mild multilevel loss of intervertebral disc space height. Combined with prominent facet arthropathy there is no foraminal stenosis bilaterally at the L5-S1 level. IMPRESSION: 1. Spine visualized from T6 through L5. 2. Stable findings of severe discitis osteomyelitis at L1-2 level with opposing endplate destruction, bilateral psoas abscess, and ventral epidural abscess narrowing the spinal canal. Electronically Signed   By: Kristine Garbe M.D.   On: 07/26/2018 19:41    Scheduled Meds:  ARIPiprazole  5 mg Oral Daily   clonazePAM  0.5 mg Oral QHS   feeding supplement (ENSURE ENLIVE)  237 mL Oral TID BM   feeding supplement (PRO-STAT SUGAR FREE 64)  30 mL Oral Daily   fenofibrate  54 mg Oral Q supper   fentaNYL  1 patch Transdermal Q72H   FLUoxetine  60 mg Oral QHS   gabapentin  300  mg Oral 3 times per day   heparin injection (subcutaneous)  5,000 Units Subcutaneous Q8H   insulin aspart  0-5 Units Subcutaneous QHS   insulin aspart  0-9 Units Subcutaneous TID WC   insulin glargine  20 Units Subcutaneous BID   metoprolol tartrate  12.5 mg Oral TID   pantoprazole  40 mg Oral Daily   propylthiouracil  75 mg Oral Daily   rOPINIRole  3 mg Oral QHS   senna  2 tablet Oral QHS   sodium chloride flush  3 mL Intravenous Q12H   Continuous Infusions:  sodium chloride 250 mL (07/27/18 0946)   vancomycin 1,500 mg (07/26/18 1814)     LOS: 16 days   Marylu Lund, MD [facility] Pager On Amion  If 7PM-7AM, please contact night-coverage 07/27/2018, 12:07 PM

## 2018-07-27 NOTE — Progress Notes (Signed)
Nutrition Follow-up   RD working remotely.  DOCUMENTATION CODES:   Obesity unspecified  INTERVENTION:  Continue Ensure Enlive po TID, each supplement provides 350 kcal and 20 grams of protein  Continue 30 ml Prostat po once daily, each supplement provides 100 kcal and 15 grams of protein.   Encourage adequate PO intake.   NUTRITION DIAGNOSIS:   Inadequate oral intake related to acute illness as evidenced by meal completion < 50%; ongoing  GOAL:   Patient will meet greater than or equal to 90% of their needs; progressing  MONITOR:   PO intake, Supplement acceptance, Labs, Weight trends  REASON FOR ASSESSMENT:   Consult Assessment of nutrition requirement/status  ASSESSMENT:   68 y.o. female with medical history of type 2 DM who presented to the ED with lower back pain. She was being treated for a lumbar discitis at home and developed increasing back pain about 1 week PTA and also noted fever of ~100 degrees F. Patient admitted on 4/21 with abscess in the epidural space of lumbar spine associated with discitis and osteomyelitis which will require 4 weeks of IV vancomycin. Neurosurgery following patient, with tentative plan for surgery this week, on 5/8.  Meal completion has been 25%. Pt with thrush which has been improving per RN. Pt currently has Ensure and Prostat ordered and has been consuming them. RD to continue with current orders to aid in caloric and protein needs especially with pre/post surgery. Pt encouraged to eat her food at meals and to drink her supplements. Labs and medications reviewed.   Diet Order:   Diet Order            Diet Carb Modified Fluid consistency: Thin; Room service appropriate? Yes  Diet effective now              EDUCATION NEEDS:   No education needs have been identified at this time  Skin:  Skin Assessment: Reviewed RN Assessment  Last BM:  5/5  Height:   Ht Readings from Last 1 Encounters:  07/11/18 5\' 1"  (1.549 m)     Weight:   Wt Readings from Last 1 Encounters:  07/11/18 90.7 kg    Ideal Body Weight:  47.73 kg  BMI:  Body mass index is 37.79 kg/m.  Estimated Nutritional Needs:   Kcal:  1900-2100 kcal  Protein:  90-100 grams  Fluid:  >/= 1.8 L/day    Corrin Parker, MS, RD, LDN Pager # [phone] After hours/ weekend pager # [phone]

## 2018-07-28 ENCOUNTER — Inpatient Hospital Stay (HOSPITAL_COMMUNITY): Payer: Medicare Other

## 2018-07-28 ENCOUNTER — Inpatient Hospital Stay (HOSPITAL_COMMUNITY)
Admission: EM | Disposition: A | Discharge status: Skilled Nursing Facility | Payer: Self-pay | Source: Home / Self Care | Attending: Internal Medicine

## 2018-07-28 ENCOUNTER — Encounter (HOSPITAL_COMMUNITY): Payer: Self-pay | Admitting: Certified Registered"

## 2018-07-28 ENCOUNTER — Inpatient Hospital Stay (HOSPITAL_COMMUNITY): Payer: Medicare Other | Admitting: Certified Registered"

## 2018-07-28 HISTORY — PX: APPLICATION OF ROBOTIC ASSISTANCE FOR SPINAL PROCEDURE: SHX6753

## 2018-07-28 HISTORY — PX: POSTERIOR LUMBAR FUSION 4 LEVEL: SHX6037

## 2018-07-28 HISTORY — PX: ANTERIOR LAT LUMBAR FUSION: SHX1168

## 2018-07-28 LAB — COMPREHENSIVE METABOLIC PANEL
ALT: 39 U/L (ref 0–44)
AST: 46 U/L — ABNORMAL HIGH (ref 15–41)
Albumin: 3.2 g/dL — ABNORMAL LOW (ref 3.5–5.0)
Alkaline Phosphatase: 95 U/L (ref 38–126)
Anion gap: 12 (ref 5–15)
BUN: 14 mg/dL (ref 8–23)
CO2: 27 mmol/L (ref 22–32)
Calcium: 10.1 mg/dL (ref 8.9–10.3)
Chloride: 101 mmol/L (ref 98–111)
Creatinine, Ser: 0.95 mg/dL (ref 0.44–1.00)
GFR calc Af Amer: >60 mL/min (ref >60)
GFR calc non Af Amer: >60 mL/min (ref >60)
Glucose, Bld: 131 mg/dL — ABNORMAL HIGH (ref 70–99)
Potassium: 4.1 mmol/L (ref 3.5–5.1)
Sodium: 140 mmol/L (ref 135–145)
Total Bilirubin: 0.5 mg/dL (ref 0.3–1.2)
Total Protein: 6.7 g/dL (ref 6.5–8.1)

## 2018-07-28 LAB — GLUCOSE, CAPILLARY
Glucose-Capillary: 109 mg/dL — ABNORMAL HIGH (ref 70–99)
Glucose-Capillary: 130 mg/dL — ABNORMAL HIGH (ref 70–99)
Glucose-Capillary: 201 mg/dL — ABNORMAL HIGH (ref 70–99)
Glucose-Capillary: 229 mg/dL — ABNORMAL HIGH (ref 70–99)

## 2018-07-28 LAB — SURGICAL PCR SCREEN
MRSA, PCR: NEGATIVE
Staphylococcus aureus: POSITIVE — AB

## 2018-07-28 LAB — PREPARE RBC (CROSSMATCH)

## 2018-07-28 LAB — HEMOGLOBIN A1C
Hgb A1c MFr Bld: 5.7 % — ABNORMAL HIGH (ref 4.8–5.6)
Mean Plasma Glucose: 116.89 mg/dL

## 2018-07-28 LAB — CBC
HCT: 31.2 % — ABNORMAL LOW (ref 36.0–46.0)
Hemoglobin: 9.6 g/dL — ABNORMAL LOW (ref 12.0–15.0)
MCH: 24.4 pg — ABNORMAL LOW (ref 26.0–34.0)
MCHC: 30.8 g/dL (ref 30.0–36.0)
MCV: 79.2 fL — ABNORMAL LOW (ref 80.0–100.0)
Platelets: 262 10*3/uL (ref 150–400)
RBC: 3.94 MIL/uL (ref 3.87–5.11)
RDW: 16.7 % — ABNORMAL HIGH (ref 11.5–15.5)
WBC: 3 10*3/uL — ABNORMAL LOW (ref 4.0–10.5)
nRBC: 0 % (ref 0.0–0.2)

## 2018-07-28 SURGERY — ANTERIOR LATERAL LUMBAR FUSION 1 LEVEL
Anesthesia: General | Site: Spine Lumbar

## 2018-07-28 MED ORDER — SODIUM CHLORIDE 0.9% FLUSH: 3.0000 mL | Freq: Two times a day (BID) | INTRAVENOUS | Status: DC

## 2018-07-28 MED ORDER — THROMBIN 5000 UNITS EX SOLR
CUTANEOUS | Status: AC
  Filled 2018-07-28: qty 5000

## 2018-07-28 MED ORDER — SODIUM CHLORIDE 0.9 % IV SOLN
250.0000 mL | INTRAVENOUS | Status: DC
  Administered 2018-07-28 – 2018-07-31 (×2): 250 mL via INTRAVENOUS

## 2018-07-28 MED ORDER — SODIUM CHLORIDE 0.9 % IV SOLN
INTRAVENOUS | Status: DC | PRN
  Administered 2018-07-28 (×3): via INTRAVENOUS

## 2018-07-28 MED ORDER — PROPOFOL 10 MG/ML IV BOLUS
INTRAVENOUS | Status: DC | PRN
  Administered 2018-07-28: 90 mg via INTRAVENOUS
  Administered 2018-07-28: 50 mg via INTRAVENOUS

## 2018-07-28 MED ORDER — MEPERIDINE HCL 25 MG/ML IJ SOLN: 6.2500 mg | INTRAMUSCULAR | Status: DC | PRN

## 2018-07-28 MED ORDER — METHOCARBAMOL 1000 MG/10ML IJ SOLN
500.0000 mg | Freq: Four times a day (QID) | INTRAVENOUS | Status: DC | PRN
  Administered 2018-07-29 – 2018-07-31 (×3): 500 mg via INTRAVENOUS
  Filled 2018-07-28 (×3): qty 5

## 2018-07-28 MED ORDER — ONDANSETRON HCL 4 MG PO TABS: 4.0000 mg | ORAL_TABLET | Freq: Four times a day (QID) | ORAL | Status: DC | PRN

## 2018-07-28 MED ORDER — BISACODYL 10 MG RE SUPP: 10.0000 mg | Freq: Every day | RECTAL | Status: DC | PRN

## 2018-07-28 MED ORDER — FENTANYL CITRATE (PF) 250 MCG/5ML IJ SOLN
INTRAMUSCULAR | Status: DC | PRN
  Administered 2018-07-28 (×2): 100 ug via INTRAVENOUS
  Administered 2018-07-28 (×9): 50 ug via INTRAVENOUS

## 2018-07-28 MED ORDER — LABETALOL HCL 5 MG/ML IV SOLN
INTRAVENOUS | Status: DC | PRN
  Administered 2018-07-28 (×2): 5 mg via INTRAVENOUS

## 2018-07-28 MED ORDER — FENTANYL CITRATE (PF) 250 MCG/5ML IJ SOLN
INTRAMUSCULAR | Status: AC
  Filled 2018-07-28: qty 5

## 2018-07-28 MED ORDER — ACETAMINOPHEN 650 MG RE SUPP: 650.0000 mg | RECTAL | Status: DC | PRN

## 2018-07-28 MED ORDER — KETAMINE HCL 50 MG/5ML IJ SOSY
PREFILLED_SYRINGE | INTRAMUSCULAR | Status: AC
  Filled 2018-07-28: qty 5

## 2018-07-28 MED ORDER — THROMBIN 20000 UNITS EX SOLR
CUTANEOUS | Status: AC
  Filled 2018-07-28: qty 20000

## 2018-07-28 MED ORDER — BUPIVACAINE HCL (PF) 0.5 % IJ SOLN
INTRAMUSCULAR | Status: AC
  Filled 2018-07-28: qty 30

## 2018-07-28 MED ORDER — SODIUM CHLORIDE 0.9% FLUSH: 3.0000 mL | INTRAVENOUS | Status: DC | PRN

## 2018-07-28 MED ORDER — SODIUM CHLORIDE 0.9 % IV SOLN
INTRAVENOUS | Status: DC | PRN
  Administered 2018-07-28: 30 ug/min via INTRAVENOUS

## 2018-07-28 MED ORDER — LIDOCAINE 2% (20 MG/ML) 5 ML SYRINGE
INTRAMUSCULAR | Status: DC | PRN
  Administered 2018-07-28: 60 mg via INTRAVENOUS

## 2018-07-28 MED ORDER — MORPHINE SULFATE (PF) 2 MG/ML IV SOLN
2.0000 mg | INTRAVENOUS | Status: DC | PRN
  Administered 2018-07-28: 2 mg via INTRAVENOUS
  Filled 2018-07-28: qty 1

## 2018-07-28 MED ORDER — PROPOFOL 10 MG/ML IV BOLUS
INTRAVENOUS | Status: AC
  Filled 2018-07-28: qty 20

## 2018-07-28 MED ORDER — CEFAZOLIN SODIUM-DEXTROSE 2-3 GM-%(50ML) IV SOLR
INTRAVENOUS | Status: DC | PRN
  Administered 2018-07-28: 2 g via INTRAVENOUS

## 2018-07-28 MED ORDER — ACETAMINOPHEN 325 MG PO TABS: 650.0000 mg | ORAL_TABLET | ORAL | Status: DC | PRN

## 2018-07-28 MED ORDER — ONDANSETRON HCL 4 MG/2ML IJ SOLN
4.0000 mg | Freq: Four times a day (QID) | INTRAMUSCULAR | Status: DC | PRN
  Administered 2018-07-30 – 2018-07-31 (×2): 4 mg via INTRAVENOUS
  Filled 2018-07-28 (×2): qty 2

## 2018-07-28 MED ORDER — KETAMINE HCL 10 MG/ML IJ SOLN
INTRAMUSCULAR | Status: DC | PRN
  Administered 2018-07-28 (×2): 15 mg via INTRAVENOUS

## 2018-07-28 MED ORDER — MIDAZOLAM HCL 2 MG/2ML IJ SOLN: 0.5000 mg | Freq: Once | INTRAMUSCULAR | Status: DC | PRN

## 2018-07-28 MED ORDER — LACTATED RINGERS IV SOLN: INTRAVENOUS | Status: DC

## 2018-07-28 MED ORDER — MIDAZOLAM HCL 5 MG/5ML IJ SOLN
INTRAMUSCULAR | Status: DC | PRN
  Administered 2018-07-28 (×2): 1 mg via INTRAVENOUS

## 2018-07-28 MED ORDER — THROMBIN (RECOMBINANT) 5000 UNITS EX SOLR
CUTANEOUS | Status: AC
  Filled 2018-07-28: qty 5000

## 2018-07-28 MED ORDER — DOCUSATE SODIUM 100 MG PO CAPS
100.0000 mg | ORAL_CAPSULE | Freq: Two times a day (BID) | ORAL | Status: DC
  Administered 2018-07-28 – 2018-07-31 (×6): 100 mg via ORAL
  Filled 2018-07-28 (×6): qty 1

## 2018-07-28 MED ORDER — THROMBIN 5000 UNITS EX SOLR
OROMUCOSAL | Status: DC | PRN
  Administered 2018-07-28 (×3): via TOPICAL

## 2018-07-28 MED ORDER — HYDROMORPHONE HCL 1 MG/ML IJ SOLN
INTRAMUSCULAR | Status: AC
  Filled 2018-07-28: qty 1

## 2018-07-28 MED ORDER — LIDOCAINE-EPINEPHRINE 1 %-1:100000 IJ SOLN
INTRAMUSCULAR | Status: AC
  Filled 2018-07-28: qty 1

## 2018-07-28 MED ORDER — METHOCARBAMOL 500 MG PO TABS
500.0000 mg | ORAL_TABLET | Freq: Four times a day (QID) | ORAL | Status: DC | PRN
  Administered 2018-07-30 – 2018-07-31 (×2): 500 mg via ORAL
  Filled 2018-07-28 (×2): qty 1

## 2018-07-28 MED ORDER — LABETALOL HCL 5 MG/ML IV SOLN
INTRAVENOUS | Status: AC
  Filled 2018-07-28: qty 4

## 2018-07-28 MED ORDER — DEXAMETHASONE SODIUM PHOSPHATE 10 MG/ML IJ SOLN
INTRAMUSCULAR | Status: DC | PRN
  Administered 2018-07-28: 10 mg via INTRAVENOUS

## 2018-07-28 MED ORDER — BUPIVACAINE HCL (PF) 0.5 % IJ SOLN
INTRAMUSCULAR | Status: DC | PRN
  Administered 2018-07-28: 16 mL
  Administered 2018-07-28: 5 mL

## 2018-07-28 MED ORDER — POLYETHYLENE GLYCOL 3350 17 G PO PACK: 17.0000 g | PACK | Freq: Every day | ORAL | Status: DC | PRN

## 2018-07-28 MED ORDER — SENNA 8.6 MG PO TABS
1.0000 | ORAL_TABLET | Freq: Two times a day (BID) | ORAL | Status: DC
  Administered 2018-07-28 – 2018-07-31 (×5): 8.6 mg via ORAL
  Filled 2018-07-28 (×5): qty 1

## 2018-07-28 MED ORDER — MIDAZOLAM HCL 2 MG/2ML IJ SOLN
INTRAMUSCULAR | Status: AC
  Filled 2018-07-28: qty 2

## 2018-07-28 MED ORDER — PHENOL 1.4 % MT LIQD: 1.0000 | OROMUCOSAL | Status: DC | PRN

## 2018-07-28 MED ORDER — PHENYLEPHRINE 40 MCG/ML (10ML) SYRINGE FOR IV PUSH (FOR BLOOD PRESSURE SUPPORT)
PREFILLED_SYRINGE | INTRAVENOUS | Status: DC | PRN
  Administered 2018-07-28: 80 ug via INTRAVENOUS

## 2018-07-28 MED ORDER — BACITRACIN ZINC 500 UNIT/GM EX OINT
TOPICAL_OINTMENT | CUTANEOUS | Status: AC
  Filled 2018-07-28: qty 28.35

## 2018-07-28 MED ORDER — FLEET ENEMA 7-19 GM/118ML RE ENEM: 1.0000 | ENEMA | Freq: Once | RECTAL | Status: DC | PRN

## 2018-07-28 MED ORDER — THROMBIN 20000 UNITS EX SOLR
CUTANEOUS | Status: DC | PRN
  Administered 2018-07-28: 12:00:00 via TOPICAL

## 2018-07-28 MED ORDER — 0.9 % SODIUM CHLORIDE (POUR BTL) OPTIME
TOPICAL | Status: DC | PRN
  Administered 2018-07-28 (×2): 1000 mL

## 2018-07-28 MED ORDER — HYDROMORPHONE HCL 1 MG/ML IJ SOLN
0.2500 mg | INTRAMUSCULAR | Status: DC | PRN
  Administered 2018-07-28 (×4): 0.5 mg via INTRAVENOUS

## 2018-07-28 MED ORDER — MENTHOL 3 MG MT LOZG: 1.0000 | LOZENGE | OROMUCOSAL | Status: DC | PRN

## 2018-07-28 MED ORDER — SODIUM CHLORIDE 0.9 % IV SOLN
1.0000 mg/kg/h | INTRAVENOUS | Status: AC
  Administered 2018-07-28: .3 mg/kg/h via INTRAVENOUS
  Filled 2018-07-28: qty 5

## 2018-07-28 MED ORDER — ALBUMIN HUMAN 5 % IV SOLN
INTRAVENOUS | Status: DC | PRN
  Administered 2018-07-28: 18:00:00 via INTRAVENOUS

## 2018-07-28 MED ORDER — LIDOCAINE-EPINEPHRINE 1 %-1:100000 IJ SOLN
INTRAMUSCULAR | Status: DC | PRN
  Administered 2018-07-28: 16 mL
  Administered 2018-07-28: 5 mL

## 2018-07-28 MED ORDER — LACTATED RINGERS IV SOLN
INTRAVENOUS | Status: DC | PRN
  Administered 2018-07-28 (×2): via INTRAVENOUS

## 2018-07-28 MED ORDER — SUCCINYLCHOLINE CHLORIDE 200 MG/10ML IV SOSY
PREFILLED_SYRINGE | INTRAVENOUS | Status: DC | PRN
  Administered 2018-07-28: 100 mg via INTRAVENOUS

## 2018-07-28 MED ORDER — SODIUM CHLORIDE 0.9 % IV SOLN
INTRAVENOUS | Status: DC | PRN
  Administered 2018-07-28: 12:00:00

## 2018-07-28 MED ORDER — SODIUM CHLORIDE 0.9 % IV SOLN: 10.0000 mL/h | Freq: Once | INTRAVENOUS | Status: DC

## 2018-07-28 SURGICAL SUPPLY — 104 items
BAG DECANTER FOR FLEXI CONT (MISCELLANEOUS) ×5 IMPLANT
BASKET BONE COLLECTION (BASKET) IMPLANT
BIT DRILL LONG 3.0X30 (BIT) ×4 IMPLANT
BIT DRILL LONG 3.0X30MM (BIT) ×1
BIT DRILL LONG 3X80 (BIT) IMPLANT
BIT DRILL LONG 3X80MM (BIT)
BIT DRILL LONG 4X80 (BIT) IMPLANT
BIT DRILL LONG 4X80MM (BIT)
BIT DRILL SHORT 3.0X30 (BIT) IMPLANT
BIT DRILL SHORT 3.0X30MM (BIT)
BIT DRILL SHORT 3X80 (BIT) IMPLANT
BIT DRILL SHORT 3X80MM (BIT)
BLADE CLIPPER SURG (BLADE) IMPLANT
BLADE SURG 11 STRL SS (BLADE) IMPLANT
BONE MATRIX OSTEOCEL PRO MED (Bone Implant) ×5 IMPLANT
BUR MATCHSTICK NEURO 3.0 LAGG (BURR) ×5 IMPLANT
CANISTER SUCT 3000ML PPV (MISCELLANEOUS) ×5 IMPLANT
CAP END 15X18 CORE X 2 40 0 (Neuro Prosthesis/Implant) ×6 IMPLANT
CAP END 2 TI LOCK SCREW X-CORE (Screw) ×12 IMPLANT
CEMENT KYPHON C01A KIT/MIXER (Cement) ×5 IMPLANT
CLIP NEUROVISION LG (CLIP) ×5 IMPLANT
CONT SPEC 4OZ CLIKSEAL STRL BL (MISCELLANEOUS) ×5 IMPLANT
CORE XCORE TI 18X20-25MM AUTO (Spacer) ×5 IMPLANT
COVER BACK TABLE 60X90IN (DRAPES) ×5 IMPLANT
COVER WAND RF STERILE (DRAPES) IMPLANT
DECANTER SPIKE VIAL GLASS SM (MISCELLANEOUS) ×5 IMPLANT
DERMABOND ADVANCED (GAUZE/BANDAGES/DRESSINGS) ×8
DERMABOND ADVANCED .7 DNX12 (GAUZE/BANDAGES/DRESSINGS) ×12 IMPLANT
DEVICE DISSECT PLASMABLAD 3.0S (MISCELLANEOUS) IMPLANT
DIGITIZER BENDINI (MISCELLANEOUS) ×5 IMPLANT
DRAPE C-ARM 42X72 X-RAY (DRAPES) ×15 IMPLANT
DRAPE C-ARMOR (DRAPES) ×10 IMPLANT
DRAPE HALF SHEET 40X57 (DRAPES) IMPLANT
DRAPE LAPAROTOMY 100X72X124 (DRAPES) ×10 IMPLANT
DRAPE POUCH INSTRU U-SHP 10X18 (DRAPES) IMPLANT
DRAPE SHEET LG 3/4 BI-LAMINATE (DRAPES) ×5 IMPLANT
DRAPE WARM FLUID 44X44 (DRAPE) ×5 IMPLANT
DURAPREP 26ML APPLICATOR (WOUND CARE) ×10 IMPLANT
DURASEAL APPLICATOR TIP (TIP) IMPLANT
DURASEAL SPINE SEALANT 3ML (MISCELLANEOUS) IMPLANT
ELECT BLADE 4.0 EZ CLEAN MEGAD (MISCELLANEOUS) ×5
ELECT BLADE 6.5 EXT (BLADE) ×5 IMPLANT
ELECT REM PT RETURN 9FT ADLT (ELECTROSURGICAL) ×10
ELECTRODE BLDE 4.0 EZ CLN MEGD (MISCELLANEOUS) ×3 IMPLANT
ELECTRODE REM PT RTRN 9FT ADLT (ELECTROSURGICAL) ×6 IMPLANT
ENDCAP CORE X 2 15X18X40 0 (Neuro Prosthesis/Implant) ×4 IMPLANT
GAUZE 4X4 16PLY RFD (DISPOSABLE) IMPLANT
GAUZE SPONGE 4X4 12PLY STRL (GAUZE/BANDAGES/DRESSINGS) IMPLANT
GLOVE BIOGEL PI IND STRL 6.5 (GLOVE) ×3 IMPLANT
GLOVE BIOGEL PI IND STRL 8 (GLOVE) ×12 IMPLANT
GLOVE BIOGEL PI IND STRL 8.5 (GLOVE) ×12 IMPLANT
GLOVE BIOGEL PI INDICATOR 6.5 (GLOVE) ×2
GLOVE BIOGEL PI INDICATOR 8 (GLOVE) ×8
GLOVE BIOGEL PI INDICATOR 8.5 (GLOVE) ×8
GLOVE ECLIPSE 7.5 STRL STRAW (GLOVE) ×15 IMPLANT
GLOVE ECLIPSE 8.5 STRL (GLOVE) ×20 IMPLANT
GLOVE EXAM NITRILE XL STR (GLOVE) IMPLANT
GLOVE SURG SS PI 6.0 STRL IVOR (GLOVE) ×10 IMPLANT
GOWN STRL REUS W/ TWL LRG LVL3 (GOWN DISPOSABLE) ×6 IMPLANT
GOWN STRL REUS W/ TWL XL LVL3 (GOWN DISPOSABLE) ×3 IMPLANT
GOWN STRL REUS W/TWL 2XL LVL3 (GOWN DISPOSABLE) ×10 IMPLANT
GOWN STRL REUS W/TWL LRG LVL3 (GOWN DISPOSABLE) ×4
GOWN STRL REUS W/TWL XL LVL3 (GOWN DISPOSABLE) ×2
GUIDEWIRE NITINOL BEVEL TIP (WIRE) ×55 IMPLANT
HEMOSTAT POWDER KIT SURGIFOAM (HEMOSTASIS) ×15 IMPLANT
KIT BASIN OR (CUSTOM PROCEDURE TRAY) ×10 IMPLANT
KIT DILATOR XLIF 5 (KITS) ×4 IMPLANT
KIT SPINE MAZOR X ROBO DISP (MISCELLANEOUS) ×5 IMPLANT
KIT SURGICAL ACCESS MAXCESS 4 (KITS) ×5 IMPLANT
KIT TURNOVER KIT B (KITS) ×5 IMPLANT
KIT XLIF (KITS) ×1
MILL MEDIUM DISP (BLADE) IMPLANT
MODULE NVM5 NEXT GEN EMG (NEEDLE) ×5 IMPLANT
NEEDLE HYPO 22GX1.5 SAFETY (NEEDLE) ×5 IMPLANT
NEEDLE HYPO 25X1 1.5 SAFETY (NEEDLE) ×5 IMPLANT
NS IRRIG 1000ML POUR BTL (IV SOLUTION) ×10 IMPLANT
PACK LAMINECTOMY NEURO (CUSTOM PROCEDURE TRAY) ×10 IMPLANT
PAD ARMBOARD 7.5X6 YLW CONV (MISCELLANEOUS) IMPLANT
PATTIES SURGICAL .5 X1 (DISPOSABLE) ×5 IMPLANT
PIN HEAD 2.5X60MM (PIN) IMPLANT
PLASMABLADE 3.0S (MISCELLANEOUS)
PUSHER RELINE FENS MAS (MISCELLANEOUS) ×50 IMPLANT
RELINE MAS NEEDLE FENS (NEEDLE) ×50 IMPLANT
ROD STRAIGHT RELINE 5.5X250 (Rod) ×10 IMPLANT
SCREW LOCK RELINE 5.5 TULIP (Screw) ×50 IMPLANT
SCREW RELINE 4.5X40 2FC POLY (Screw) ×10 IMPLANT
SCREW RELINE PA 2FC 5.5X40 (Screw) ×20 IMPLANT
SCREW RELINE PA 2FC 5.5X45 (Screw) ×10 IMPLANT
SCREW RELINE PA 6.5X45 (Screw) ×10 IMPLANT
SCREW SCHANZ SA 4.0MM (MISCELLANEOUS) ×5 IMPLANT
SCREW SET ENDCAP (Screw) ×8 IMPLANT
SPONGE LAP 4X18 RFD (DISPOSABLE) IMPLANT
SPONGE SURGIFOAM ABS GEL 100 (HEMOSTASIS) ×5 IMPLANT
SUT PROLENE 6 0 BV (SUTURE) IMPLANT
SUT VIC AB 1 CT1 18XBRD ANBCTR (SUTURE) ×3 IMPLANT
SUT VIC AB 1 CT1 8-18 (SUTURE) ×2
SUT VIC AB 2-0 CP2 18 (SUTURE) ×15 IMPLANT
SUT VIC AB 3-0 SH 8-18 (SUTURE) ×30 IMPLANT
SYR 3ML LL SCALE MARK (SYRINGE) ×20 IMPLANT
TOWEL GREEN STERILE (TOWEL DISPOSABLE) ×5 IMPLANT
TOWEL GREEN STERILE FF (TOWEL DISPOSABLE) ×5 IMPLANT
TRAY FOLEY MTR SLVR 16FR STAT (SET/KITS/TRAYS/PACK) ×5 IMPLANT
TUBE MAZOR SA REDUCTION (TUBING) ×5 IMPLANT
WATER STERILE IRR 1000ML POUR (IV SOLUTION) ×5 IMPLANT

## 2018-07-28 NOTE — Anesthesia Procedure Notes (Signed)
Arterial Line Insertion Start/End5/10/2018 12:00 PM, 07/28/2018 12:10 PM Performed by: Verdie Drown, CRNA, CRNA  Patient location: Pre-op. Preanesthetic checklist: patient identified, IV checked, site marked, risks and benefits discussed, surgical consent, monitors and equipment checked, pre-op evaluation, timeout performed and anesthesia consent Left, radial was placed Catheter size: 20 G Hand hygiene performed , maximum sterile barriers used  and Seldinger technique used Allen's test indicative of satisfactory collateral circulation Attempts: 1 Procedure performed without using ultrasound guided technique. Following insertion, Biopatch and dressing applied. Post procedure assessment: normal  Patient tolerated the procedure well with no immediate complications.

## 2018-07-28 NOTE — Progress Notes (Signed)
Patient ID: [name], female   DOB: 22-Jun-1950, 68 y.o.   MRN: [id number] Patient is for surgery today. Discussed situation with nursing and administration. Will try to arrange brief visit for patient with her husband, before surgery.

## 2018-07-28 NOTE — Anesthesia Procedure Notes (Signed)
Procedure Name: Intubation Date/Time: 07/28/2018 12:51 PM Performed by: Imagene Riches, CRNA Pre-anesthesia Checklist: Patient identified, Emergency Drugs available, Suction available and Patient being monitored Patient Re-evaluated:Patient Re-evaluated prior to induction Oxygen Delivery Method: Circle System Utilized Preoxygenation: Pre-oxygenation with 100% oxygen Induction Type: IV induction Laryngoscope Size: Glidescope and 3 Grade View: Grade I Tube type: Oral Tube size: 7.0 mm Number of attempts: 1 Airway Equipment and Method: Stylet and Oral airway Placement Confirmation: ETT inserted through vocal cords under direct vision,  positive ETCO2 and breath sounds checked- equal and bilateral Secured at: 22 cm Tube secured with: Tape Dental Injury: Teeth and Oropharynx as per pre-operative assessment

## 2018-07-28 NOTE — Progress Notes (Signed)
Pt transported to surgery. Visit with Spouse successful

## 2018-07-28 NOTE — Anesthesia Preprocedure Evaluation (Signed)
Anesthesia Evaluation    History of Anesthesia Complications (+) PONV  Airway Mallampati: III  TM Distance: >3 FB Neck ROM: Full    Dental  (+) Teeth Intact   Pulmonary    breath sounds clear to auscultation- rhonchi       Cardiovascular hypertension,  Rhythm:Regular     Neuro/Psych    GI/Hepatic   Endo/Other  diabetes  Renal/GU      Musculoskeletal   Abdominal   Peds  Hematology   Anesthesia Other Findings   Reproductive/Obstetrics                             Anesthesia Physical Anesthesia Plan  ASA: III  Anesthesia Plan: General   Post-op Pain Management:    Induction: Intravenous and Rapid sequence  PONV Risk Score and Plan: 4 or greater and Ondansetron and Dexamethasone  Airway Management Planned: Oral ETT  Additional Equipment: None  Intra-op Plan:   Post-operative Plan: Extubation in OR and Possible Post-op intubation/ventilation  Informed Consent: I have reviewed the patients History and Physical, chart, labs and discussed the procedure including the risks, benefits and alternatives for the proposed anesthesia with the patient or authorized representative who has indicated his/her understanding and acceptance.     Dental advisory given  Plan Discussed with: CRNA and Surgeon  Anesthesia Plan Comments:         Anesthesia Quick Evaluation

## 2018-07-28 NOTE — Progress Notes (Signed)
Placed patient on CPAP for the night with pressure set at 18cm, as per home settings.

## 2018-07-28 NOTE — Progress Notes (Signed)
Left radial arterial line removed per Dr. Glennon Mac.  Pressured held for 10 minutes.  No bleeding noted.  Pressure dressing applied.

## 2018-07-28 NOTE — Transfer of Care (Signed)
Immediate Anesthesia Transfer of Care Note  Patient: [name]  Procedure(s) Performed: Anterolateral Decompression Lumbar One-Two for osteomyelitis reconstruction w/titanium strut allograft Fusion Lumbar One-Two (N/A Spine Lumbar) Posterior Fixation Thoracic Ten-Lumbar Four with pedicle augmentaion with robotic assistance (N/A Back) APPLICATION OF ROBOTIC ASSISTANCE FOR SPINAL PROCEDURE (N/A )  Patient Location: PACU  Anesthesia Type:General  Level of Consciousness: awake, drowsy and patient cooperative  Airway & Oxygen Therapy: Patient Spontanous Breathing and Patient connected to face mask oxygen  Post-op Assessment: Report given to RN and Post -op Vital signs reviewed and stable  Post vital signs: Reviewed and stable  Last Vitals:  Vitals Value Taken Time  BP 161/74 07/28/2018  8:10 PM  Temp    Pulse 95 07/28/2018  8:19 PM  Resp 19 07/28/2018  8:19 PM  SpO2 100 % 07/28/2018  8:19 PM  Vitals shown include unvalidated device data.  Last Pain:  Vitals:   07/28/18 0956  TempSrc:   PainSc: 7       Patients Stated Pain Goal: 2 (12/27/10 1975)  Complications: No apparent anesthesia complications

## 2018-07-28 NOTE — Anesthesia Postprocedure Evaluation (Signed)
Anesthesia Post Note  Patient: [name]  Procedure(s) Performed: Anterolateral Decompression Lumbar One-Two for osteomyelitis reconstruction w/titanium strut allograft Fusion Lumbar One-Two (N/A Spine Lumbar) Posterior Fixation Thoracic Ten-Lumbar Four with pedicle augmentaion with robotic assistance (N/A Back) APPLICATION OF ROBOTIC ASSISTANCE FOR SPINAL PROCEDURE (N/A )     Patient location during evaluation: PACU Anesthesia Type: General Level of consciousness: awake and alert, patient cooperative and oriented Pain management: pain level controlled Vital Signs Assessment: post-procedure vital signs reviewed and stable Respiratory status: spontaneous breathing, nonlabored ventilation, respiratory function stable and patient connected to nasal cannula oxygen Cardiovascular status: blood pressure returned to baseline and stable Postop Assessment: no apparent nausea or vomiting Anesthetic complications: no    Last Vitals:  Vitals:   07/28/18 2115 07/28/18 2139  BP: (!) 149/57 (!) 177/48  Pulse: 95 95  Resp: (!) 29 (!) 22  Temp: (!) 36.3 C 36.5 C  SpO2: 100% 100%    Last Pain:  Vitals:   07/28/18 2139  TempSrc: Oral  PainSc:                  Talib Headley,E. Fordyce Lepak

## 2018-07-28 NOTE — Progress Notes (Signed)
PROGRESS NOTE    [name]  UKG:[id number] DOB: [birth year] DOA: 07/11/2018 PCP: Crist Infante, MD    Brief Narrative:  Admitted with abscess in the epidural space of lumbar spine associated with discitis and osteomyelitis.  Currently will require 4 weeks of IV vancomycin.  Plan of care consulted for pain management.  Neurosurgery currently following patient, with tentative plan for surgery next week, 07/28/18. Patient will be transferred to [facility].  Assessment & Plan:   Principal Problem:   Abscess in epidural space of lumbar spine Active Problems:   DM (diabetes mellitus) (HCC)   Hypothyroidism   Obesity   Graves disease   Pain   Palliative care by specialist  Abscess in epidural space of lumbar spine associated with discitis and osteomyelitis of L1-L2/myositis and bilateral psoas muscles, progressive abscess in left psoas muscle -Interventional radiology had been consulted and appreciated-wanted infectious disease and neurosurgery input -Neurosurgery initially consulted and advised that repeat needle biopsy of the elbow wound would be preferable to an open surgical decompression -Infectious disease consulted and appreciated, recommended drainage of the abscess and holding antibiotics pending culture, and then starting vancomycin- plan to continue through 08/12/2018 with reimaging after 4 weeks -Status post underwent CT aspiration but there was no significant drainage, only a few drops of blood aspirated from left psoas site -Blood culture showed no growth to date -Aerobic and anaerobic cultures and Gram stain showed no organisms -Case was rediscussed with neurosurgery, Dr. Zada Finders, felt that no surgical recommendation was warranted at this time and recommended prolonged course of antibiotics -Continue pain control, palliative care was consulted for symptom management -PT and OT recommended SNF placement -Patient and husband initially would like for her to return home. Dr.  Ree Kida discussed with husband on 07/19/2018 regarding discharge and SNF. Both patient and husband are opened to SNF placement. -Neurosurgery continues to follow. Pt to undergo surgery today  Diabetes mellitus, type II -Continue Lantus, insulin sliding scale with CBG monitoring -currently stable  Essential hypertension -Continue metoprolol as tolerated -BP presently stable  Graves' disease -Continue PTU as patient tolerates  Fibromyalgia/depression/restless leg syndrome -Continue aripiprazole, fluoxetine, clonazepam, ropinirole -Presently stable  Chronic pain syndrome -Continue gabapentin, flexeril, toradol, fentanyl patch, as needed oxycodone as well as Dilaudid -Palliative care consulted and appreciated for pain control -Currently remains stable.   Hyperlipidemia -Continue fenofibrate -Stable currently  Normocytic anemia -Anemia panel showed iron of 27, U IBC 247, TIBC 274, saturation ratio of 10%, ferritin 28, folate 15.7, B12 2063 -Stable, hemoglobin 8.8, baseline appears between 8 and 9 -Remains hemodynamically stable  Hypokalemia -Resovled, continue to monitor  -potassium has remained within normal limits, labs reviewed  Hyponatremia -Resolved, continue to monitor  Oral candidiasis/thrush -Appears to be improving -Nurse had reported white patches on patient's tongue- currently appears to be clear and dry appearing -Completed course of fluconazole and nystatin -Presently stable  Obesity, morbid -BMI of 37.79 -Recommend diet/lifestyle modification  DVT prophylaxis: heparin subQ Code Status: Full Family Communication: Pt in room, family not at bedside Disposition Plan: SNF when stable  Consultants:  Infectious disease Interventional radiology Neurosurgery  Procedures:     Antimicrobials: Anti-infectives (From admission, onward)   Start     Dose/Rate Route Frequency Ordered Stop   07/28/18 1200  bacitracin 50,000 Units in sodium chloride  0.9 % 500 mL irrigation       As needed 07/28/18 1352     07/28/18 0600  ceFAZolin (ANCEF) IVPB 2g/100 mL premix     2  g 200 mL/hr over 30 Minutes Intravenous To Surgery 07/27/18 1912 07/29/18 0600   07/22/18 1800  [MAR Hold]  vancomycin (VANCOCIN) 1,500 mg in sodium chloride 0.9 % 500 mL IVPB     (MAR Hold since Fri 07/28/2018 at 1149. Reason: Transfer to a Procedural area.)   1,500 mg 250 mL/hr over 120 Minutes Intravenous Every 24 hours 07/21/18 1804     07/17/18 1600  vancomycin (VANCOCIN) 1,750 mg in sodium chloride 0.9 % 500 mL IVPB  Status:  Discontinued     1,750 mg 250 mL/hr over 120 Minutes Intravenous Every 24 hours 07/17/18 1541 07/21/18 1804   07/17/18 1300  fluconazole (DIFLUCAN) tablet 200 mg  Status:  Discontinued     200 mg Oral Every 24 hours 07/16/18 1338 07/21/18 1043   07/15/18 1400  fluconazole (DIFLUCAN) IVPB 200 mg  Status:  Discontinued     200 mg 100 mL/hr over 60 Minutes Intravenous Every 24 hours 07/14/18 1322 07/16/18 1338   07/14/18 1400  vancomycin (VANCOCIN) 1,000 mg in sodium chloride 0.9 % 250 mL IVPB  Status:  Discontinued     1,000 mg 250 mL/hr over 60 Minutes Intravenous Every 24 hours 07/13/18 1304 07/14/18 0752   07/14/18 1400  vancomycin (VANCOCIN) 1,250 mg in sodium chloride 0.9 % 250 mL IVPB  Status:  Discontinued     1,250 mg 166.7 mL/hr over 90 Minutes Intravenous Every 24 hours 07/14/18 0752 07/17/18 1541   07/14/18 1400  fluconazole (DIFLUCAN) IVPB 200 mg     200 mg 100 mL/hr over 60 Minutes Intravenous  Once 07/14/18 1303 07/14/18 1549   07/13/18 1345  vancomycin (VANCOCIN) 2,000 mg in sodium chloride 0.9 % 500 mL IVPB     2,000 mg 250 mL/hr over 120 Minutes Intravenous  Once 07/13/18 1301 07/13/18 1615      Subjective: Reports feeling nervous about surgery today  Objective: Vitals:   07/27/18 2300 07/27/18 2337 07/28/18 0332 07/28/18 0833  BP:  (!) 111/45 (!) 156/75 (!) 150/63  Pulse: 92 88 88 (!) 107  Resp: 19  20 18   Temp:  98.3  F (36.8 C) 98.1 F (36.7 C) 98 F (36.7 C)  TempSrc:  Oral Oral Oral  SpO2: 98% 98% 96% 90%  Weight:      Height:        Intake/Output Summary (Last 24 hours) at 07/28/2018 1515 Last data filed at 07/28/2018 1446 Gross per 24 hour  Intake 4109.89 ml  Output 1750 ml  Net 2359.89 ml   Filed Weights   07/11/18 1218  Weight: 90.7 kg    Examination: General exam: Conversant, in no acute distress Respiratory system: normal chest rise, clear, no audible wheezing Cardiovascular system: regular rhythm, s1-s2 Gastrointestinal system: Nondistended, nontender, pos BS, morbidly obese Central nervous system: No seizures, no tremors Extremities: No cyanosis, no joint deformities Skin: No rashes, no pallor Psychiatry: Affect normal // no auditory hallucinations    Data Reviewed: I have personally reviewed following labs and imaging studies  CBC: Recent Labs  Lab 07/22/18 0303 07/23/18 0413 07/25/18 0438 07/28/18 0829  WBC  --   --   --  3.0*  HGB 8.5* 9.6* 8.8* 9.6*  HCT 28.4* 33.2* 29.9* 31.2*  MCV  --   --   --  79.2*  PLT  --   --   --  408   Basic Metabolic Panel: Recent Labs  Lab 07/23/18 0413 07/24/18 0326 07/25/18 0438 07/28/18 0829  NA 138  --  138 140  K 4.3  --  4.2 4.1  CL 101  --  100 101  CO2 27  --  30 27  GLUCOSE 111*  --  107* 131*  BUN 13  --  22 14  CREATININE 0.81 0.94 0.91 0.95  CALCIUM 9.7  --  9.5 10.1  MG 2.0  --   --   --    GFR: Estimated Creatinine Clearance: 59.8 mL/min (by C-G formula based on SCr of 0.95 mg/dL). Liver Function Tests: Recent Labs  Lab 07/23/18 0413 07/28/18 0829  AST  --  46*  ALT  --  39  ALKPHOS  --  95  BILITOT  --  0.5  PROT  --  6.7  ALBUMIN 3.3* 3.2*   No results for input(s): LIPASE, AMYLASE in the last 168 hours. No results for input(s): AMMONIA in the last 168 hours. Coagulation Profile: No results for input(s): INR, PROTIME in the last 168 hours. Cardiac Enzymes: No results for input(s): CKTOTAL,  CKMB, CKMBINDEX, TROPONINI in the last 168 hours. BNP (last 3 results) No results for input(s): PROBNP in the last 8760 hours. HbA1C: Recent Labs    07/28/18 0829  HGBA1C 5.7*   CBG: Recent Labs  Lab 07/27/18 1137 07/27/18 1633 07/27/18 2122 07/28/18 0705 07/28/18 1119  GLUCAP 106* 156* 128* 109* 130*   Lipid Profile: No results for input(s): CHOL, HDL, LDLCALC, TRIG, CHOLHDL, LDLDIRECT in the last 72 hours. Thyroid Function Tests: No results for input(s): TSH, T4TOTAL, FREET4, T3FREE, THYROIDAB in the last 72 hours. Anemia Panel: No results for input(s): VITAMINB12, FOLATE, FERRITIN, TIBC, IRON, RETICCTPCT in the last 72 hours. Sepsis Labs: No results for input(s): PROCALCITON, LATICACIDVEN in the last 168 hours.  Recent Results (from the past 240 hour(s))  Surgical pcr screen     Status: Abnormal   Collection Time: 07/28/18 11:38 AM  Result Value Ref Range Status   MRSA, PCR NEGATIVE NEGATIVE Final   Staphylococcus aureus POSITIVE (A) NEGATIVE Final    Comment: (NOTE) The Xpert SA Assay (FDA approved for NASAL specimens in patients 86 years of age and older), is one component of a comprehensive surveillance program. It is not intended to diagnose infection nor to guide or monitor treatment. Performed at [facility], [place] [address] [place], [place] [address]      Radiology Studies: Ct Thoracic Spine Wo Contrast  Result Date: 07/26/2018 CLINICAL DATA:  68 y/o F; pre-surgical evaluation, robotic protocol, imaging to include T8-L4. L1-2 discitis osteomyelitis. EXAM: CT THORACIC SPINE WITHOUT CONTRAST TECHNIQUE: Multidetector CT images of the thoracic and lumbar spine from T6-L5 were obtained using the standard protocol without intravenous contrast. COMPARISON:  07/20/2018 CT lumbar spine. 07/11/2018 lumbar spine MRI. 01/06/2017 CT chest. FINDINGS: Alignment: L5-S1 grade 1 anterolisthesis. Vertebrae: Stable findings of severe discitis osteomyelitis at the L1-2 level  with opposing endplate destruction and widening of the intervertebral disc space. There is a ventral epidural collection which is narrowing the spinal canal better characterized on the prior lumbar spine MRI. Bony fragments extend into the adjacent psoas muscles with there are phlegmon/abscess, suboptimally assessed in the absence of intravenous contrast. No new level of fracture or malalignment. Paraspinal and other soft tissues: Cholecystectomy. Bilateral nonobstructive nephrolithiasis. Aortic calcific atherosclerosis. Disc levels: Multilevel discogenic degenerative changes of the thoracic spine with loss of intervertebral disc space height, small disc displacements, and endplate marginal osteophytes. Spondylosis of the lumbar spine results in mild multilevel loss of intervertebral disc space height. Combined  with prominent facet arthropathy there is no foraminal stenosis bilaterally at the L5-S1 level. IMPRESSION: 1. Spine visualized from T6 through L5. 2. Stable findings of severe discitis osteomyelitis at L1-2 level with opposing endplate destruction, bilateral psoas abscess, and ventral epidural abscess narrowing the spinal canal. Electronically Signed   By: Kristine Garbe M.D.   On: 07/26/2018 19:41    Scheduled Meds: . [MAR Hold] ARIPiprazole  5 mg Oral Daily  . [MAR Hold] clonazePAM  0.5 mg Oral QHS  . [MAR Hold] feeding supplement (ENSURE ENLIVE)  237 mL Oral TID BM  . [MAR Hold] feeding supplement (PRO-STAT SUGAR FREE 64)  30 mL Oral Daily  . [MAR Hold] fenofibrate  54 mg Oral Q supper  . [MAR Hold] fentaNYL  1 patch Transdermal Q72H  . [MAR Hold] FLUoxetine  60 mg Oral QHS  . [MAR Hold] gabapentin  300 mg Oral 3 times per day  . [MAR Hold] heparin injection (subcutaneous)  5,000 Units Subcutaneous Q8H  . [MAR Hold] insulin aspart  0-5 Units Subcutaneous QHS  . [MAR Hold] insulin aspart  0-9 Units Subcutaneous TID WC  . [MAR Hold] insulin glargine  20 Units Subcutaneous BID  .  [MAR Hold] metoprolol tartrate  12.5 mg Oral TID  . [MAR Hold] pantoprazole  40 mg Oral Daily  . [MAR Hold] propylthiouracil  75 mg Oral Daily  . [MAR Hold] rOPINIRole  3 mg Oral QHS  . [MAR Hold] senna  2 tablet Oral QHS  . [MAR Hold] sodium chloride flush  3 mL Intravenous Q12H   Continuous Infusions: . [MAR Hold] sodium chloride 10 mL/hr at 07/28/18 0400  . sodium chloride    .  ceFAZolin (ANCEF) IV    . lactated ringers    . [MAR Hold] vancomycin 1,500 mg (07/27/18 1740)     LOS: 17 days   Marylu Lund, MD [facility] Pager On Amion  If 7PM-7AM, please contact night-coverage 07/28/2018, 3:15 PM

## 2018-07-28 NOTE — Op Note (Addendum)
Date of surgery: 07/28/2018 Preoperative diagnosis: Osteomyelitis L1-L2 with severe stenosis L1-L2 and instability Postoperative diagnosis: Same Procedure: Anterolateral decompression of L1 and L2 with decompression of thecal sac at that level.  Reconstruction with ex core corpectomy cage 18 x 40 mm endplates and ostia cell allograft.  Posterior fixation from T10-L4 with pedicular fixation placed with Mazor robotic assistance and methacrylate augmentation of pedicle screws.  Bendini rod placement.  EMG neuro monitoring during entire procedure. Surgeon: Kristeen Miss Anesthesia: General endotracheal Indications: [name] is a 68 year old individual who has had a discitis that turned into vertebral osteomyelitis at L1-L2 she had evidence of epidural abscess formation and after undergoing 5 weeks of home therapy was noted to have worsening pain and weakness in her lower extremities.  She was reimaged on admission to the hospital and found to have extensive destructive changes of the L1 and L2 vertebrae with collapse and severe stenosis at L1-L2.  After further IV antibiotic therapy her poor functional status suggested the need to undergo surgical decompression and stabilization so that she may resume ambulation.  Patient has significant comorbidities including morbid obesity diabetes Graves' disease.  Procedure: The patient was brought to the operating room supine on the stretcher.  After the smooth induction of general endotracheal anesthesia, she was turned into the right lateral decubitus position.  Fluoroscopic guidance was used to localize the L1-L2 disks and vertebrae.  These were marked on the side of the skin and the patient was then secured to the operating table in a neutral position.  The patient had been placed under neural monitoring with EMGs involving the lower extremity musculature and all the definitive dermatomes from L1-S1.  The procedure was started by making a lateral incision and then  placing a second posteriorly inferiorly located incision a small dilator was passed through the outer fascia and guided by the finger to avoid the retroperitoneal organs including the kidney.  Lateral aspect of the psoas was entered with a K wire and radiographic confirmation identified this at the L1-L2 interspace.  Then a series of dilators was placed over this with the EMG electrical monitoring identifying good protection from the neural structures.  The retractor was then placed over the outer cannular tube and dilated to allow visualization of the lateral aspect of the psoas muscle at the L1-L2 vertebrae.  Further dissection of the superior more located musculature allowed greater opening.  After isolating were L1-L2 and the disc space was an approach was chosen through the psoas muscle which was noted to be very edematous no gross pus was identified but then there was significant granulation tissue as the disc space was approached.  It was noted that the disc was completely disintegrated however several pieces of endplate were encountered these were removed and a corpectomy of L1 and L2 was then undertaken to resect significant quantities of severely degenerated bone the dissection was carried out until the ventral dura could be identified and was well decompressed. More than half of each of the vertebral bodies was removed..  Once this was identified and felt to be well decompressed reconstructive efforts yielded placement of an expandable corpectomy cage with 18 x 40 mm and caps and 18 mm tall cage was used.  This was expanded under fluoroscopic guidance and yielded good neutral balance across the L1-L2 space.  With that the expandable cage was locked into position.  Further ostia cell was packed around the cage in the interval.  Hemostasis was checked in the soft tissues and  the retractor was gradually relaxed and removed.  The outer fascia was closed with 2-0 Vicryl in interrupted fashion 3-0 Vicryl was  used in the subcuticular skin.  Once this was completed the patient was transferred from the right lateral decubitus position into the prone position on a Adams table.  The Mazor robotic arm was attached to the operating table and after prepping and draping the back the Mazor robot was attached to the left posterior superior iliac crest with a singular Schanz pin.  Then registration radiographs were obtained and ultimately a pre-approved operative plan was put into action placing K wires in T10 and T11-T12 bilaterally and then in L3 and L4 bilaterally.  Once the K wires were placed screws were placed over the K wires and these included 4.5 x 40 mm screws in T10 5.5 x 40 mm screws and T11 5.5 x 40 mm screws in T12 5.5 x 45 mm screws in L3 and 6.5 x 45 mm screws in L4.  It was noted that the bone was very soft in order to enhance fixation methacrylate augmentation was used under fluoroscopic visualization 1/2 cc of methacrylate was injected along each of the fenestrated screws from T10-L4.  Once this was completed the Bandini rod bender was used to assess rod placement from T10-L4.  Rods were cut and appropriately bent and then placed in a neutral construct with the patient lying prone.  These were then tightened and final radiographs were obtained.  It was felt that a good overall correction was obtained and with this the individual incisions were closed with 2-0 and 3-0 Vicryl in interrupted fashion.  During the first portion of the K's while the corpectomy is being performed there is a moderate degree of blood loss from the corpectomy site itself during this time she was transfused 2 units of packed red cells.  Overall blood loss for the procedure was estimated at 450 cc.  Neural monitoring was performed during the first portion of the operation and then during the placement of the pedicle screws with the screws being monitored for any evidence of cut out none was identified.  The patient was then returned to the  recovery room in stable condition.

## 2018-07-29 DIAGNOSIS — Z419 Encounter for procedure for purposes other than remedying health state, unspecified: Secondary | ICD-10-CM

## 2018-07-29 LAB — GLUCOSE, CAPILLARY
Glucose-Capillary: 123 mg/dL — ABNORMAL HIGH (ref 70–99)
Glucose-Capillary: 152 mg/dL — ABNORMAL HIGH (ref 70–99)
Glucose-Capillary: 108 mg/dL — ABNORMAL HIGH (ref 70–99)
Glucose-Capillary: 123 mg/dL — ABNORMAL HIGH (ref 70–99)

## 2018-07-29 LAB — POCT I-STAT 4, (NA,K, GLUC, HGB,HCT)
Glucose, Bld: 156 mg/dL — ABNORMAL HIGH (ref 70–99)
HCT: 32 % — ABNORMAL LOW (ref 36.0–46.0)
Hemoglobin: 10.9 g/dL — ABNORMAL LOW (ref 12.0–15.0)
Potassium: 4.6 mmol/L (ref 3.5–5.1)
Sodium: 136 mmol/L (ref 135–145)

## 2018-07-29 LAB — POCT I-STAT 7, (LYTES, BLD GAS, ICA,H+H)
Acid-Base Excess: 2 mmol/L (ref 0.0–2.0)
Acid-base deficit: 3 mmol/L — ABNORMAL HIGH (ref 0.0–2.0)
Bicarbonate: 27.3 mmol/L (ref 20.0–28.0)
Bicarbonate: 21.7 mmol/L (ref 20.0–28.0)
Calcium, Ion: 1.24 mmol/L (ref 1.15–1.40)
Calcium, Ion: 1.12 mmol/L — ABNORMAL LOW (ref 1.15–1.40)
HCT: 23 % — ABNORMAL LOW (ref 36.0–46.0)
HCT: 28 % — ABNORMAL LOW (ref 36.0–46.0)
Hemoglobin: 7.8 g/dL — ABNORMAL LOW (ref 12.0–15.0)
Hemoglobin: 9.5 g/dL — ABNORMAL LOW (ref 12.0–15.0)
O2 Saturation: 100 %
O2 Saturation: 100 %
Patient temperature: 36.2
Potassium: 3.8 mmol/L (ref 3.5–5.1)
Potassium: 4.2 mmol/L (ref 3.5–5.1)
Sodium: 138 mmol/L (ref 135–145)
Sodium: 138 mmol/L (ref 135–145)
TCO2: 29 mmol/L (ref 22–32)
TCO2: 23 mmol/L (ref 22–32)
pCO2 arterial: 41.1 mmHg (ref 32.0–48.0)
pCO2 arterial: 36.6 mmHg (ref 32.0–48.0)
pH, Arterial: 7.427 (ref 7.350–7.450)
pH, Arterial: 7.381 (ref 7.350–7.450)
pO2, Arterial: 212 mmHg — ABNORMAL HIGH (ref 83.0–108.0)
pO2, Arterial: 187 mmHg — ABNORMAL HIGH (ref 83.0–108.0)

## 2018-07-29 LAB — BASIC METABOLIC PANEL
Anion gap: 11 (ref 5–15)
BUN: 11 mg/dL (ref 8–23)
CO2: 25 mmol/L (ref 22–32)
Calcium: 9 mg/dL (ref 8.9–10.3)
Chloride: 102 mmol/L (ref 98–111)
Creatinine, Ser: 0.78 mg/dL (ref 0.44–1.00)
GFR calc Af Amer: >60 mL/min (ref >60)
GFR calc non Af Amer: >60 mL/min (ref >60)
Glucose, Bld: 149 mg/dL — ABNORMAL HIGH (ref 70–99)
Potassium: 3.7 mmol/L (ref 3.5–5.1)
Sodium: 138 mmol/L (ref 135–145)

## 2018-07-29 LAB — CBC
HCT: 29.8 % — ABNORMAL LOW (ref 36.0–46.0)
Hemoglobin: 9.6 g/dL — ABNORMAL LOW (ref 12.0–15.0)
MCH: 25.6 pg — ABNORMAL LOW (ref 26.0–34.0)
MCHC: 32.2 g/dL (ref 30.0–36.0)
MCV: 79.5 fL — ABNORMAL LOW (ref 80.0–100.0)
Platelets: 235 10*3/uL (ref 150–400)
RBC: 3.75 MIL/uL — ABNORMAL LOW (ref 3.87–5.11)
RDW: 16.2 % — ABNORMAL HIGH (ref 11.5–15.5)
WBC: 7.3 10*3/uL (ref 4.0–10.5)
nRBC: 0 % (ref 0.0–0.2)

## 2018-07-29 MED ORDER — VANCOMYCIN HCL 10 G IV SOLR
1500.0000 mg | INTRAVENOUS | Status: DC
  Administered 2018-07-29 – 2018-07-31 (×3): 1500 mg via INTRAVENOUS
  Filled 2018-07-29 (×3): qty 1500

## 2018-07-29 MED ORDER — KETOROLAC TROMETHAMINE 15 MG/ML IJ SOLN
15.0000 mg | Freq: Four times a day (QID) | INTRAMUSCULAR | Status: AC
  Administered 2018-07-29 – 2018-07-30 (×5): 15 mg via INTRAVENOUS
  Filled 2018-07-29 (×5): qty 1

## 2018-07-29 NOTE — Progress Notes (Signed)
PROGRESS NOTE    [name]  FIE:[id number] DOB: 1950-04-27 DOA: 07/11/2018 PCP: Crist Infante, MD    Brief Narrative:  Admitted with abscess in the epidural space of lumbar spine associated with discitis and osteomyelitis.  Currently will require 4 weeks of IV vancomycin.  Plan of care consulted for pain management.  Neurosurgery currently following patient, with tentative plan for surgery next week, 07/28/18. Patient will be transferred to [facility].  Assessment & Plan:   Principal Problem:   Abscess in epidural space of lumbar spine Active Problems:   DM (diabetes mellitus) (HCC)   Hypothyroidism   Obesity   Graves disease   Pain   Palliative care by specialist  Abscess in epidural space of lumbar spine associated with discitis and osteomyelitis of L1-L2/myositis and bilateral psoas muscles, progressive abscess in left psoas muscle -Interventional radiology had been consulted and appreciated-wanted infectious disease and neurosurgery input -Neurosurgery initially consulted and advised that repeat needle biopsy of the elbow wound would be preferable to an open surgical decompression -Infectious disease consulted and appreciated, recommended drainage of the abscess and holding antibiotics pending culture, and then starting vancomycin- plan to continue through 08/12/2018 with reimaging after 4 weeks -Status post underwent CT aspiration but there was no significant drainage, only a few drops of blood aspirated from left psoas site -Blood culture showed no growth to date -Aerobic and anaerobic cultures and Gram stain showed no organisms -Case was rediscussed with neurosurgery, Dr. Zada Finders, felt that no surgical recommendation was warranted at this time and recommended prolonged course of antibiotics -Continue pain control, palliative care was consulted for symptom management -PT and OT recommended SNF placement -Patient and husband initially would like for her to return home. Dr.  Ree Kida discussed with husband on 07/19/2018 regarding discharge and SNF. Both patient and husband are opened to SNF placement. -Neurosurgery following. S/p surgery 5/8. Continue with PT  Diabetes mellitus, type II -Continue Lantus, insulin sliding scale with CBG monitoring -presently stable  Essential hypertension -Continue metoprolol as tolerated -BP currently stable  Graves' disease -Continue PTU as tolerated  Fibromyalgia/depression/restless leg syndrome -Continue aripiprazole, fluoxetine, clonazepam, ropinirole -Presently stable  Chronic pain syndrome -Continue gabapentin, flexeril, toradol, fentanyl patch, as needed oxycodone as well as Dilaudid -Palliative care consulted and appreciated for pain control -Presently stable.   Hyperlipidemia -Continue fenofibrate -Stable currently  Normocytic anemia -Anemia panel showed iron of 27, U IBC 247, TIBC 274, saturation ratio of 10%, ferritin 28, folate 15.7, B12 2063 -Stable, hemoglobin 8.8, baseline appears between 8 and 9 -Currently hemodynamically stable  Hypokalemia -Resovled, continue to monitor  -potassium has remained within normal limits, labs reviewed  Hyponatremia -Resolved, continue to monitor  Oral candidiasis/thrush -Appears to be improving -Nurse had reported white patches on patient's tongue- currently appears to be clear and dry appearing -Completed course of fluconazole and nystatin -Currently stable  Obesity, morbid -BMI of 37.79 -Recommend diet/lifestyle modification  DVT prophylaxis: heparin subQ Code Status: Full Family Communication: Pt in room, family not at bedside Disposition Plan: SNF when stable  Consultants:  Infectious disease Interventional radiology Neurosurgery  Procedures:     Antimicrobials: Anti-infectives (From admission, onward)   Start     Dose/Rate Route Frequency Ordered Stop   07/29/18 1017  vancomycin (VANCOCIN) 1,500 mg in sodium chloride 0.9 % 500  mL IVPB     1,500 mg 250 mL/hr over 120 Minutes Intravenous Every 24 hours 07/29/18 1017     07/28/18 1200  bacitracin 50,000 Units in sodium chloride  0.9 % 500 mL irrigation  Status:  Discontinued       As needed 07/28/18 1352 07/28/18 1957   07/28/18 0600  ceFAZolin (ANCEF) IVPB 2g/100 mL premix  Status:  Discontinued     2 g 200 mL/hr over 30 Minutes Intravenous To Surgery 07/27/18 1912 07/28/18 2133   07/22/18 1800  vancomycin (VANCOCIN) 1,500 mg in sodium chloride 0.9 % 500 mL IVPB  Status:  Discontinued     1,500 mg 250 mL/hr over 120 Minutes Intravenous Every 24 hours 07/21/18 1804 07/29/18 1017   07/17/18 1600  vancomycin (VANCOCIN) 1,750 mg in sodium chloride 0.9 % 500 mL IVPB  Status:  Discontinued     1,750 mg 250 mL/hr over 120 Minutes Intravenous Every 24 hours 07/17/18 1541 07/21/18 1804   07/17/18 1300  fluconazole (DIFLUCAN) tablet 200 mg  Status:  Discontinued     200 mg Oral Every 24 hours 07/16/18 1338 07/21/18 1043   07/15/18 1400  fluconazole (DIFLUCAN) IVPB 200 mg  Status:  Discontinued     200 mg 100 mL/hr over 60 Minutes Intravenous Every 24 hours 07/14/18 1322 07/16/18 1338   07/14/18 1400  vancomycin (VANCOCIN) 1,000 mg in sodium chloride 0.9 % 250 mL IVPB  Status:  Discontinued     1,000 mg 250 mL/hr over 60 Minutes Intravenous Every 24 hours 07/13/18 1304 07/14/18 0752   07/14/18 1400  vancomycin (VANCOCIN) 1,250 mg in sodium chloride 0.9 % 250 mL IVPB  Status:  Discontinued     1,250 mg 166.7 mL/hr over 90 Minutes Intravenous Every 24 hours 07/14/18 0752 07/17/18 1541   07/14/18 1400  fluconazole (DIFLUCAN) IVPB 200 mg     200 mg 100 mL/hr over 60 Minutes Intravenous  Once 07/14/18 1303 07/14/18 1549   07/13/18 1345  vancomycin (VANCOCIN) 2,000 mg in sodium chloride 0.9 % 500 mL IVPB     2,000 mg 250 mL/hr over 120 Minutes Intravenous  Once 07/13/18 1301 07/13/18 1615      Subjective: Complaining of post-op neck pain  Objective: Vitals:   07/28/18  2139 07/29/18 0414 07/29/18 0805 07/29/18 1224  BP: (!) 177/48 (!) 184/69 (!) 172/73 (!) 160/115  Pulse: 95 (!) 105    Resp: (!) 22 16    Temp: 97.7 F (36.5 C) 98.4 F (36.9 C) 98.1 F (36.7 C) 98.8 F (37.1 C)  TempSrc: Oral Oral Oral Oral  SpO2: 100% 100%    Weight:      Height:        Intake/Output Summary (Last 24 hours) at 07/29/2018 1510 Last data filed at 07/29/2018 7681 Gross per 24 hour  Intake 2241.14 ml  Output 1840 ml  Net 401.14 ml   Filed Weights   07/11/18 1218  Weight: 90.7 kg    Examination: General exam: Awake, laying in bed, in nad Respiratory system: Normal respiratory effort, no wheezing Cardiovascular system: regular rate, s1, s2 Gastrointestinal system: Soft, nondistended, positive BS Central nervous system: CN2-12 grossly intact, strength intact Extremities: Perfused, no clubbing Skin: Normal skin turgor, no notable skin lesions seen Psychiatry: Mood normal // no visual hallucinations   Data Reviewed: I have personally reviewed following labs and imaging studies  CBC: Recent Labs  Lab 07/23/18 0413 07/25/18 0438 07/28/18 0829 07/29/18 0601  WBC  --   --  3.0* 7.3  HGB 9.6* 8.8* 9.6* 9.6*  HCT 33.2* 29.9* 31.2* 29.8*  MCV  --   --  79.2* 79.5*  PLT  --   --  262 545   Basic Metabolic Panel: Recent Labs  Lab 07/23/18 0413 07/24/18 0326 07/25/18 0438 07/28/18 0829 07/29/18 0601  NA 138  --  138 140 138  K 4.3  --  4.2 4.1 3.7  CL 101  --  100 101 102  CO2 27  --  30 27 25   GLUCOSE 111*  --  107* 131* 149*  BUN 13  --  22 14 11   CREATININE 0.81 0.94 0.91 0.95 0.78  CALCIUM 9.7  --  9.5 10.1 9.0  MG 2.0  --   --   --   --    GFR: Estimated Creatinine Clearance: 71 mL/min (by C-G formula based on SCr of 0.78 mg/dL). Liver Function Tests: Recent Labs  Lab 07/23/18 0413 07/28/18 0829  AST  --  46*  ALT  --  39  ALKPHOS  --  95  BILITOT  --  0.5  PROT  --  6.7  ALBUMIN 3.3* 3.2*   No results for input(s): LIPASE, AMYLASE  in the last 168 hours. No results for input(s): AMMONIA in the last 168 hours. Coagulation Profile: No results for input(s): INR, PROTIME in the last 168 hours. Cardiac Enzymes: No results for input(s): CKTOTAL, CKMB, CKMBINDEX, TROPONINI in the last 168 hours. BNP (last 3 results) No results for input(s): PROBNP in the last 8760 hours. HbA1C: Recent Labs    07/28/18 0829  HGBA1C 5.7*   CBG: Recent Labs  Lab 07/28/18 1119 07/28/18 2017 07/28/18 2150 07/29/18 0803 07/29/18 1117  GLUCAP 130* 201* 229* 123* 152*   Lipid Profile: No results for input(s): CHOL, HDL, LDLCALC, TRIG, CHOLHDL, LDLDIRECT in the last 72 hours. Thyroid Function Tests: No results for input(s): TSH, T4TOTAL, FREET4, T3FREE, THYROIDAB in the last 72 hours. Anemia Panel: No results for input(s): VITAMINB12, FOLATE, FERRITIN, TIBC, IRON, RETICCTPCT in the last 72 hours. Sepsis Labs: No results for input(s): PROCALCITON, LATICACIDVEN in the last 168 hours.  Recent Results (from the past 240 hour(s))  Surgical pcr screen     Status: Abnormal   Collection Time: 07/28/18 11:38 AM  Result Value Ref Range Status   MRSA, PCR NEGATIVE NEGATIVE Final   Staphylococcus aureus POSITIVE (A) NEGATIVE Final    Comment: (NOTE) The Xpert SA Assay (FDA approved for NASAL specimens in patients 95 years of age and older), is one component of a comprehensive surveillance program. It is not intended to diagnose infection nor to guide or monitor treatment. Performed at [facility], [place] [address], [place], [place] [address]      Radiology Studies: Dg Lumbar Spine 2-3 Views  Result Date: 07/28/2018 CLINICAL DATA:  Lumbar fusion EXAM: LUMBAR SPINE - 2-3 VIEW COMPARISON:  CT dated 07/26/2018 FINDINGS: Extensive posterior fusion hardware is noted extending from the T10 to L4 level. A vertebral body spacer is noted at the L1-L2 level. The hardware is grossly intact. Multilevel degenerative changes are noted. IMPRESSION:  Status post posterior fusion as above. Electronically Signed   By: Constance Holster M.D.   On: 07/28/2018 20:08   Dg C-arm 1-60 Min  Result Date: 07/28/2018 CLINICAL DATA:  Posterior fusion EXAM: DG C-ARM 61-120 MIN COMPARISON:  Lumbar radiographs from the same day FINDINGS: Extensive posterior fusion hardware is noted extending from the T10 to L4 level. A vertebral body spacer is noted at the L1-L2 level. The hardware is grossly intact. Multilevel degenerative changes are noted. The total fluoroscopy time was 3 minutes and 27 seconds. Multiple images were  saved. IMPRESSION: Fluoroscopy for lumbar fusion as above. Electronically Signed   By: Constance Holster M.D.   On: 07/28/2018 20:10    Scheduled Meds: . ARIPiprazole  5 mg Oral Daily  . clonazePAM  0.5 mg Oral QHS  . docusate sodium  100 mg Oral BID  . feeding supplement (ENSURE ENLIVE)  237 mL Oral TID BM  . feeding supplement (PRO-STAT SUGAR FREE 64)  30 mL Oral Daily  . fenofibrate  54 mg Oral Q supper  . fentaNYL  1 patch Transdermal Q72H  . FLUoxetine  60 mg Oral QHS  . gabapentin  300 mg Oral 3 times per day  . heparin injection (subcutaneous)  5,000 Units Subcutaneous Q8H  . insulin aspart  0-5 Units Subcutaneous QHS  . insulin aspart  0-9 Units Subcutaneous TID WC  . insulin glargine  20 Units Subcutaneous BID  . ketorolac  15 mg Intravenous Q6H  . metoprolol tartrate  12.5 mg Oral TID  . pantoprazole  40 mg Oral Daily  . propylthiouracil  75 mg Oral Daily  . rOPINIRole  3 mg Oral QHS  . senna  2 tablet Oral QHS  . senna  1 tablet Oral BID  . sodium chloride flush  3 mL Intravenous Q12H  . sodium chloride flush  3 mL Intravenous Q12H   Continuous Infusions: . sodium chloride 10 mL/hr at 07/28/18 0400  . sodium chloride    . sodium chloride 250 mL (07/28/18 2200)  . lactated ringers    . methocarbamol (ROBAXIN) IV    . vancomycin 1,500 mg (07/29/18 1349)     LOS: 18 days   Marylu Lund, MD [facility]  Pager On Amion  If 7PM-7AM, please contact night-coverage 07/29/2018, 3:10 PM

## 2018-07-29 NOTE — Accreditation Note (Signed)
[employer] called for Aspen Lumbar Brace.

## 2018-07-29 NOTE — Evaluation (Signed)
Physical Therapy Re- Evaluation Patient Details Name: [name] MRN: [id number] DOB: [birth year] Today's Date: 07/29/2018   History of Present Illness  [name] is a 68 y.o. female with history of diabetes mellitus type 2, sleep apnea, hypertension, Graves' disease, chronic and disease, anemia. Received epidural injection 05/23/2018. She is currently being treated for a lumbar discitis.  Pt admitted with continued back pain and found to have progression of abscess in epidural space of lumbar spine associated with Discitis and Osteomyelitis of L1-L2, and Progressive Abscess in Left Psoas Muscle. S/p anterolateral decompression L1-2 for osteomyelitis, posterior fixation thoracic T10-L4 on 5/8   Clinical Impression  Pt re-evaluated s/p surgery listed above. Pt remains limited by pain post surgery but able to tolerate increased activity this session. Requiring maximal assistance for bed mobility and performed limited ambulation from bed to chair with min assist and walker. Pt continues with decreased cognition, poor balance, weakness, and pain. Continue to recommend SNF for ongoing Physical Therapy.       Follow Up Recommendations SNF;Supervision/Assistance - 24 hour    Equipment Recommendations  None recommended by PT    Recommendations for Other Services       Precautions / Restrictions Precautions Precautions: Fall;Back Required Braces or Orthoses: Spinal Brace Spinal Brace: Lumbar corset Restrictions Weight Bearing Restrictions: No      Mobility  Bed Mobility Overal bed mobility: Needs Assistance Bed Mobility: Rolling;Sidelying to Sit Rolling: Max assist Sidelying to sit: Max assist       General bed mobility comments: MaxA for rolling towards left and sitting up on edge of bed. Difficulty sequencing  Transfers Overall transfer level: Needs assistance Equipment used: Rolling walker (2 wheeled) Transfers: Sit to/from [facility] Sit to Stand: Min  assist Stand pivot transfers: Min assist       General transfer comment: MinA to stand from edge of bed and pivot towards chair. Cues for sequencing and direction  Ambulation/Gait                Stairs            Wheelchair Mobility    Modified Rankin (Stroke Patients Only)       Balance Overall balance assessment: Needs assistance Sitting-balance support: Bilateral upper extremity supported;Feet supported Sitting balance-Leahy Scale: Fair     Standing balance support: Bilateral upper extremity supported Standing balance-Leahy Scale: Poor Standing balance comment: reliant on external support                             Pertinent Vitals/Pain Pain Assessment: Faces Faces Pain Scale: Hurts even more Pain Location: surgical site Pain Descriptors / Indicators: Operative site guarding;Grimacing Pain Intervention(s): Monitored during session;Limited activity within patient's tolerance    Home Living Family/patient expects to be discharged to:: Private residence Living Arrangements: Spouse/significant other;Children Available Help at Discharge: Family;Available PRN/intermittently Type of Home: House Home Access: Stairs to enter Entrance Stairs-Rails: Psychiatric nurse of Steps: 3 (can fit walker on each step) Home Layout: One level Home Equipment: Walker - 2 wheels;Wheelchair - manual;Hospital bed Additional Comments: pt lives with son and husband who work. handicapped bathroom with [place] shower head and seat, has RW    Prior Function Level of Independence: Independent with assistive device(s)         Comments: has been using RW since back pain started this year.  Had been able to get up and use bathroom, but could not sit  straight up until she was admitted to hospital     Hand Dominance   Dominant Hand: Right    Extremity/Trunk Assessment   Upper Extremity Assessment Upper Extremity Assessment: Generalized weakness     Lower Extremity Assessment Lower Extremity Assessment: Generalized weakness    Cervical / Trunk Assessment Cervical / Trunk Assessment: Other exceptions Cervical / Trunk Exceptions: s/p spinal sx  Communication   Communication: No difficulties  Cognition Arousal/Alertness: Awake/alert Behavior During Therapy: Flat affect Overall Cognitive Status: Impaired/Different from baseline Area of Impairment: Awareness;Following commands;Problem solving                       Following Commands: Follows one step commands inconsistently   Awareness: Intellectual Problem Solving: Slow processing;Decreased initiation;Difficulty sequencing;Requires verbal cues;Requires tactile cues General Comments: Very delayed processing, requires increased time for following simple commands      General Comments      Exercises     Assessment/Plan    PT Assessment Patient needs continued PT services  PT Problem List Decreased strength;Decreased mobility;Decreased activity tolerance;Decreased knowledge of use of DME;Decreased balance;Pain       PT Treatment Interventions      PT Goals (Current goals can be found in the Care Plan section)  Acute Rehab PT Goals Patient Stated Goal: less pain PT Goal Formulation: With patient Time For Goal Achievement: 08/12/18 Potential to Achieve Goals: Fair    Frequency Min 5X/week   Barriers to discharge        Co-evaluation               AM-PAC PT "6 Clicks" Mobility  Outcome Measure Help needed turning from your back to your side while in a flat bed without using bedrails?: A Lot Help needed moving from lying on your back to sitting on the side of a flat bed without using bedrails?: A Lot Help needed moving to and from a bed to a chair (including a wheelchair)?: A Lot Help needed standing up from a chair using your arms (e.g., wheelchair or bedside chair)?: A Little Help needed to walk in hospital room?: A Lot Help needed climbing 3-5  steps with a railing? : Total 6 Click Score: 12    End of Session Equipment Utilized During Treatment: Gait belt;Back brace Activity Tolerance: Patient tolerated treatment well Patient left: with call bell/phone within reach;in chair;with chair alarm set Nurse Communication: Mobility status PT Visit Diagnosis: Other abnormalities of gait and mobility (R26.89);Muscle weakness (generalized) (M62.81);Difficulty in walking, not elsewhere classified (R26.2);Pain Pain - part of body: (back)    Time: 1696-7893 PT Time Calculation (min) (ACUTE ONLY): 32 min   Charges:   PT Evaluation $PT Re-evaluation: 1 Re-eval PT Treatments $Therapeutic Activity: 8-22 mins      Ellamae Sia, PT, DPT Acute Rehabilitation Services Pager [phone] Office [phone]   Willy Eddy 07/29/2018, 10:07 AM

## 2018-07-29 NOTE — Progress Notes (Signed)
Pharmacy Antibiotic Note  [name] is a 68 y.o. female with lumbar discitis caused by methicillin resistant CoNS in March 2020 and was on vancomycin PTA. Vancomycin course stated 06/03/18 with a planned 6 week duration, expected to end on 07/16/18. Most recent regimen was vancomycin 1000 mg IV q24h. Last dose of vancomycin PTA was 07/10/18, antibiotics were held on admission and resumed on 4/23.  With limited drainage from CT aspiration, ID recommends treating for another 4 weeks with abx (treat thru 5/23).  Today, 07/29/2018: No Vancomycin given on 5/8 -- may have been missed due to OR and medication changes on transfer Scr remains stable and within normal limits. Afebrile. WBC wnl.  Last trough at goal on 5/5 at current dose.    Plan: Continue vancomycin to 1500 mg IV q24 hr - next dose now since missed 5/8.  Monitor renal function, culture results, and clinical status   Height: 5\' 1"  (154.9 cm) Weight: 200 lb (90.7 kg) IBW/kg (Calculated) : 47.8  Temp (24hrs), Avg:97.8 F (36.6 C), Min:97.3 F (36.3 C), Max:98.4 F (36.9 C)  Recent Labs  Lab 07/23/18 0413 07/24/18 0326 07/25/18 0438 07/25/18 1847 07/28/18 0829 07/29/18 0601  WBC  --   --   --   --  3.0* 7.3  CREATININE 0.81 0.94 0.91  --  0.95 0.78  VANCOTROUGH  --   --   --  17  --   --     Estimated Creatinine Clearance: 71 mL/min (by C-G formula based on SCr of 0.78 mg/dL).    Allergies  Allergen Reactions  . Imitrex [Sumatriptan] Other (See Comments)    Vascular spasms  . Nsaids Other (See Comments)    PT UNABLE TO TOLERATE NSAID'S DRUGS DUE TO HX OF GASTRIC SLEEVE SURGERY  . Rosuvastatin Calcium Other (See Comments)    Leg/muscle pain  . Statins Other (See Comments)    Leg/muscle pain  . Sulfonamide Derivatives Hives    Childhood allergy   . Codeine Nausea Only  . Promethazine Hcl Other (See Comments)    Restless leg feeling all over body  . Vicodin [Hydrocodone-Acetaminophen] Nausea And Vomiting     Antimicrobials this admission: Prior to admission: Vancomycin 06/03/18 > 07/10/18 (planned stop 4/26) --> but now plan to cont with another prolonged course of abx  Vancomycin 4/23 >> Fluconazole 4/24 ( oropharyngeal candidiasis) >>4/30  Dose adjustment: 4/27 at 1345 VT= 10 --> changed to 1750 mg IV q24h 5/1 VT = 20 (with SCr 0.96); reduce to 1500 q24 5/5 VT = 17 (with SCr 0.91); continue 1500 q24  Microbiology results: 4/21 BCx: neg FINAL 4/23 abscess, L psoas:  neg FINAL  Previous recent cultures: 3/17 Urine: >100K pseudomonas, proteus 3/14 aspiration of L1,L2 disc space: abundant staphylococcus epidermidis (oxacillin resistant)  Thank you for allowing pharmacy to be a part of this patient's care.  Sloan Leiter, PharmD, BCPS, BCCCP Clinical Pharmacist Please refer to [facility] for [place] numbers 07/29/2018 10:15 AM

## 2018-07-29 NOTE — Progress Notes (Signed)
PHARMACY CONSULT NOTE FOR:  OUTPATIENT  PARENTERAL ANTIBIOTIC THERAPY (OPAT)  Indication: Discitis / Osteomyelitis Regimen: Vancomycin 1500mg  IV every 24 hours End date: 08/12/18  IV antibiotic discharge orders are pended. To discharging provider:  please sign these orders via discharge navigator,  Select New Orders & click on the button choice - Manage This Unsigned Work.     Thank you for allowing pharmacy to be a part of this patient's care.  Brain Hilts 07/29/2018, 10:20 AM

## 2018-07-29 NOTE — Progress Notes (Signed)
Patient ID: [name], female   DOB: 1950-10-19, 68 y.o.   MRN: [id number] Patient is awake alert having a fair amount of pain in her back primarily.  She is moving all her extremities.  Lower extremity strength appears quite good.  Postoperative hemoglobin is 9.6.  No further transfusions likely to be necessary.  Her electrolytes and renal function is good.  I will add Toradol for pain control.  Will engage physical therapy to mobilize patient.  Brace will not be necessary for initial mobilizations.

## 2018-07-29 NOTE — Progress Notes (Signed)
Orthopedic Tech Progress Note Patient Details:  ZAYLIE GISLER Oct 12, 1950 [id number] Called in order to [facility] for Aspen Lumbar Brace. Patient ID: SHELVIA FOJTIK, female   DOB: [birth year], 68 y.o.   MRN: [id number]   Janit Pagan 07/29/2018, 6:53 AM

## 2018-07-30 LAB — GLUCOSE, CAPILLARY
Glucose-Capillary: 90 mg/dL (ref 70–99)
Glucose-Capillary: 117 mg/dL — ABNORMAL HIGH (ref 70–99)
Glucose-Capillary: 120 mg/dL — ABNORMAL HIGH (ref 70–99)
Glucose-Capillary: 141 mg/dL — ABNORMAL HIGH (ref 70–99)

## 2018-07-30 NOTE — Progress Notes (Signed)
Neurosurgery Service Progress Note  Subjective: No acute events overnight, no new complaints this morning   Objective: Vitals:   07/29/18 2058 07/29/18 2320 07/30/18 0331 07/30/18 0827  BP: (!) 142/56 (!) 157/67 (!) 163/63 (!) 129/45  Pulse: 90 92 95 96  Resp: 20 18 16 14   Temp: 98 F (36.7 C) 98 F (36.7 C) 98.4 F (36.9 C) 97.7 F (36.5 C)  TempSrc: Oral Oral Oral Oral  SpO2: 93% 99% 100% 98%  Weight:      Height:       Temp (24hrs), Avg:98.2 F (36.8 C), Min:97.7 F (36.5 C), Max:98.8 F (37.1 C)  CBC Latest Ref Rng & Units 07/29/2018 07/28/2018 07/28/2018  WBC 4.0 - 10.5 K/uL 7.3 - -  Hemoglobin 12.0 - 15.0 g/dL 9.6(L) 9.5(L) 10.9(L)  Hematocrit 36.0 - 46.0 % 29.8(L) 28.0(L) 32.0(L)  Platelets 150 - 400 K/uL 235 - -   BMP Latest Ref Rng & Units 07/29/2018 07/28/2018 07/28/2018  Glucose 70 - 99 mg/dL 149(H) - 156(H)  BUN 8 - 23 mg/dL 11 - -  Creatinine 0.44 - 1.00 mg/dL 0.78 - -  Sodium 135 - 145 mmol/L 138 138 136  Potassium 3.5 - 5.1 mmol/L 3.7 4.2 4.6  Chloride 98 - 111 mmol/L 102 - -  CO2 22 - 32 mmol/L 25 - -  Calcium 8.9 - 10.3 mg/dL 9.0 - -    Intake/Output Summary (Last 24 hours) at 07/30/2018 0901 Last data filed at 07/30/2018 0552 Gross per 24 hour  Intake 600 ml  Output 2050 ml  Net -1450 ml    Current Facility-Administered Medications:  .  0.9 %  sodium chloride infusion, 250 mL, Intravenous, PRN, Kristeen Miss, MD, Last Rate: 10 mL/hr at 07/28/18 0400 .  0.9 %  sodium chloride infusion, 10 mL/hr, Intravenous, Once, Kristeen Miss, MD .  0.9 %  sodium chloride infusion, 250 mL, Intravenous, Continuous, Kristeen Miss, MD, Last Rate: 10 mL/hr at 07/28/18 2200, 250 mL at 07/28/18 2200 .  acetaminophen (TYLENOL) tablet 650 mg, 650 mg, Oral, Q4H PRN **OR** acetaminophen (TYLENOL) suppository 650 mg, 650 mg, Rectal, Q4H PRN, Kristeen Miss, MD .  ARIPiprazole (ABILIFY) tablet 5 mg, 5 mg, Oral, Daily, Kristeen Miss, MD, 5 mg at 07/29/18 5176 .  bisacodyl (DULCOLAX)  suppository 10 mg, 10 mg, Rectal, Daily PRN, Kristeen Miss, MD .  clonazePAM Bobbye [place]) tablet 0.5 mg, 0.5 mg, Oral, QHS, Kristeen Miss, MD, 0.5 mg at 07/29/18 2041 .  cyclobenzaprine (FLEXERIL) tablet 10 mg, 10 mg, Oral, QHS PRN, Kristeen Miss, MD, 10 mg at 07/28/18 2255 .  docusate sodium (COLACE) capsule 100 mg, 100 mg, Oral, BID, Kristeen Miss, MD, 100 mg at 07/29/18 2041 .  feeding supplement (ENSURE ENLIVE) (ENSURE ENLIVE) liquid 237 mL, 237 mL, Oral, TID BM, Kristeen Miss, MD, 237 mL at 07/29/18 2039 .  feeding supplement (PRO-STAT SUGAR FREE 64) liquid 30 mL, 30 mL, Oral, Daily, Kristeen Miss, MD, 30 mL at 07/29/18 1341 .  fenofibrate tablet 54 mg, 54 mg, Oral, Q supper, Kristeen Miss, MD, 54 mg at 07/29/18 1743 .  fentaNYL (DURAGESIC) 50 MCG/HR 1 patch, 1 patch, Transdermal, Q72H, Kristeen Miss, MD, 1 patch at 07/29/18 1750 .  FLUoxetine (PROZAC) capsule 60 mg, 60 mg, Oral, QHS, Kristeen Miss, MD, 60 mg at 07/29/18 2041 .  gabapentin (NEURONTIN) capsule 300 mg, 300 mg, Oral, 3 times per day, Kristeen Miss, MD, 300 mg at 07/29/18 2042 .  heparin injection 5,000 Units, 5,000 Units, Subcutaneous, Q8H, Elsner,  Mallie Mussel, MD, 5,000 Units at 07/30/18 0446 .  HYDROmorphone (DILAUDID) injection 1 mg, 1 mg, Intravenous, Q4H PRN, Kristeen Miss, MD, 1 mg at 07/29/18 0835 .  insulin aspart (novoLOG) injection 0-5 Units, 0-5 Units, Subcutaneous, QHS, Kristeen Miss, MD, 2 Units at 07/28/18 2203 .  insulin aspart (novoLOG) injection 0-9 Units, 0-9 Units, Subcutaneous, TID WC, Kristeen Miss, MD, 2 Units at 07/29/18 1300 .  insulin glargine (LANTUS) injection 20 Units, 20 Units, Subcutaneous, BID, Kristeen Miss, MD, 20 Units at 07/29/18 2045 .  ketorolac (TORADOL) 15 MG/ML injection 15 mg, 15 mg, Intravenous, Q6H, Kristeen Miss, MD, 15 mg at 07/30/18 0446 .  lactated ringers infusion, , Intravenous, Continuous, Elsner, Henry, MD .  menthol-cetylpyridinium (CEPACOL) lozenge 3 mg, 1 lozenge, Oral, PRN **OR**  phenol (CHLORASEPTIC) mouth spray 1 spray, 1 spray, Mouth/Throat, PRN, Kristeen Miss, MD .  methocarbamol (ROBAXIN) tablet 500 mg, 500 mg, Oral, Q6H PRN, 500 mg at 07/30/18 0446 **OR** methocarbamol (ROBAXIN) 500 mg in dextrose 5 % 50 mL IVPB, 500 mg, Intravenous, Q6H PRN, Kristeen Miss, MD, Last Rate: 100 mL/hr at 07/29/18 1300, 500 mg at 07/29/18 1300 .  metoprolol tartrate (LOPRESSOR) tablet 12.5 mg, 12.5 mg, Oral, TID, Kristeen Miss, MD, 12.5 mg at 07/29/18 2050 .  morphine 2 MG/ML injection 2-4 mg, 2-4 mg, Intravenous, Q2H PRN, Kristeen Miss, MD, 2 mg at 07/28/18 2336 .  ondansetron (ZOFRAN) tablet 4 mg, 4 mg, Oral, Q6H PRN **OR** ondansetron (ZOFRAN) injection 4 mg, 4 mg, Intravenous, Q6H PRN, Kristeen Miss, MD .  oxyCODONE (Oxy IR/ROXICODONE) immediate release tablet 10-15 mg, 10-15 mg, Oral, Q4H PRN, Kristeen Miss, MD, 15 mg at 07/30/18 0547 .  pantoprazole (PROTONIX) EC tablet 40 mg, 40 mg, Oral, Daily, Kristeen Miss, MD, 40 mg at 07/29/18 6294 .  polyethylene glycol (MIRALAX / GLYCOLAX) packet 17 g, 17 g, Oral, Daily PRN, Kristeen Miss, MD .  propylthiouracil (PTU) tablet 75 mg, 75 mg, Oral, Daily, Kristeen Miss, MD, 75 mg at 07/29/18 7654 .  rOPINIRole (REQUIP) tablet 3 mg, 3 mg, Oral, QHS, Kristeen Miss, MD, 3 mg at 07/29/18 2041 .  senna (SENOKOT) tablet 17.2 mg, 2 tablet, Oral, QHS, Kristeen Miss, MD, 17.2 mg at 07/29/18 2041 .  senna (SENOKOT) tablet 8.6 mg, 1 tablet, Oral, BID, Kristeen Miss, MD, 8.6 mg at 07/29/18 2042 .  sodium chloride flush (NS) 0.9 % injection 10-40 mL, 10-40 mL, Intracatheter, PRN, Kristeen Miss, MD .  sodium chloride flush (NS) 0.9 % injection 3 mL, 3 mL, Intravenous, Q12H, Kristeen Miss, MD, 3 mL at 07/28/18 2203 .  sodium chloride flush (NS) 0.9 % injection 3 mL, 3 mL, Intravenous, PRN, Kristeen Miss, MD .  sodium chloride flush (NS) 0.9 % injection 3 mL, 3 mL, Intravenous, Q12H, Elsner, Henry, MD .  sodium chloride flush (NS) 0.9 % injection 3 mL, 3 mL,  Intravenous, PRN, Kristeen Miss, MD .  sodium phosphate (FLEET) 7-19 GM/118ML enema 1 enema, 1 enema, Rectal, Once PRN, Kristeen Miss, MD .  vancomycin (VANCOCIN) 1,500 mg in sodium chloride 0.9 % 500 mL IVPB, 1,500 mg, Intravenous, Q24H, Priscella Mann, RPH, Stopped at 07/29/18 1549   Physical Exam: Strength 5/5 x4 except decreased RLE eversion 2/2, strength appears normal with hip flexion, SILTx4  Assessment & Plan: 68 y.o. woman s/p L1-2 corpectomy with T10-L4 PSF, recovering well.  -advance activity as tolerated -SNF planning  Judith Part  07/30/18 9:01 AM

## 2018-07-30 NOTE — Progress Notes (Signed)
PROGRESS NOTE    [name]  WHQ:[id number] DOB: Jul 30, 1950 DOA: 07/11/2018 PCP: Crist Infante, MD    Brief Narrative:  Admitted with abscess in the epidural space of lumbar spine associated with discitis and osteomyelitis.  Currently will require 4 weeks of IV vancomycin.  Plan of care consulted for pain management.  Neurosurgery currently following patient, with tentative plan for surgery next week, 07/28/18. Patient will be transferred to [facility].  Assessment & Plan:   Principal Problem:   Abscess in epidural space of lumbar spine Active Problems:   DM (diabetes mellitus) (HCC)   Hypothyroidism   Obesity   Graves disease   Pain   Palliative care by specialist  Abscess in epidural space of lumbar spine associated with discitis and osteomyelitis of L1-L2/myositis and bilateral psoas muscles, progressive abscess in left psoas muscle -Interventional radiology had been consulted and appreciated-wanted infectious disease and neurosurgery input -Neurosurgery initially consulted and advised that repeat needle biopsy of the elbow wound would be preferable to an open surgical decompression -Infectious disease consulted and appreciated, recommended drainage of the abscess and holding antibiotics pending culture, and then starting vancomycin- plan to continue through 08/12/2018 with reimaging after 4 weeks -Status post underwent CT aspiration but there was no significant drainage, only a few drops of blood aspirated from left psoas site -Blood culture showed no growth to date -Aerobic and anaerobic cultures and Gram stain showed no organisms -Case was rediscussed with neurosurgery, Dr. Zada Finders, felt that no surgical recommendation was warranted at this time and recommended prolonged course of antibiotics -Continue pain control, palliative care was consulted for symptom management -PT and OT recommended SNF placement -Patient and husband initially would like for her to return home. Dr.  Ree Kida discussed with husband on 07/19/2018 regarding discharge and SNF. Both patient and husband are opened to SNF placement. -Neurosurgery following. S/p surgery 5/8. PT following -Pending SNF placement  Diabetes mellitus, type II -Continue Lantus, insulin sliding scale with CBG monitoring -currently stable  Essential hypertension -Continue metoprolol as tolerated -BP presently stable  Graves' disease -Continue PTU as tolerated  Fibromyalgia/depression/restless leg syndrome -Continue aripiprazole, fluoxetine, clonazepam, ropinirole -currently stable  Chronic pain syndrome -Continue gabapentin, flexeril, toradol, fentanyl patch, as needed oxycodone as well as Dilaudid -Palliative care consulted and appreciated for pain control -currently stable.   Hyperlipidemia -Continue fenofibrate -Stable currently  Normocytic anemia -Anemia panel showed iron of 27, U IBC 247, TIBC 274, saturation ratio of 10%, ferritin 28, folate 15.7, B12 2063 -Stable, hemoglobin 8.8, baseline appears between 8 and 9 -Remains hemodynamically stable  Hypokalemia -Resovled, continue to monitor  -potassium has remained within normal limits, labs reviewed  Hyponatremia -Resolved, continue to monitor  Oral candidiasis/thrush -Appears to be improving -Nurse had reported white patches on patient's tongue- currently appears to be clear and dry appearing -Completed course of fluconazole and nystatin -Presently stable  Obesity, morbid -BMI of 37.79 -Recommend diet/lifestyle modification  DVT prophylaxis: heparin subQ Code Status: Full Family Communication: Pt in room, family not at bedside Disposition Plan: SNF when stable  Consultants:  Infectious disease Interventional radiology Neurosurgery  Procedures:     Antimicrobials: Anti-infectives (From admission, onward)   Start     Dose/Rate Route Frequency Ordered Stop   07/29/18 1017  vancomycin (VANCOCIN) 1,500 mg in sodium  chloride 0.9 % 500 mL IVPB     1,500 mg 250 mL/hr over 120 Minutes Intravenous Every 24 hours 07/29/18 1017     07/28/18 1200  bacitracin 50,000 Units in  sodium chloride 0.9 % 500 mL irrigation  Status:  Discontinued       As needed 07/28/18 1352 07/28/18 1957   07/28/18 0600  ceFAZolin (ANCEF) IVPB 2g/100 mL premix  Status:  Discontinued     2 g 200 mL/hr over 30 Minutes Intravenous To Surgery 07/27/18 1912 07/28/18 2133   07/22/18 1800  vancomycin (VANCOCIN) 1,500 mg in sodium chloride 0.9 % 500 mL IVPB  Status:  Discontinued     1,500 mg 250 mL/hr over 120 Minutes Intravenous Every 24 hours 07/21/18 1804 07/29/18 1017   07/17/18 1600  vancomycin (VANCOCIN) 1,750 mg in sodium chloride 0.9 % 500 mL IVPB  Status:  Discontinued     1,750 mg 250 mL/hr over 120 Minutes Intravenous Every 24 hours 07/17/18 1541 07/21/18 1804   07/17/18 1300  fluconazole (DIFLUCAN) tablet 200 mg  Status:  Discontinued     200 mg Oral Every 24 hours 07/16/18 1338 07/21/18 1043   07/15/18 1400  fluconazole (DIFLUCAN) IVPB 200 mg  Status:  Discontinued     200 mg 100 mL/hr over 60 Minutes Intravenous Every 24 hours 07/14/18 1322 07/16/18 1338   07/14/18 1400  vancomycin (VANCOCIN) 1,000 mg in sodium chloride 0.9 % 250 mL IVPB  Status:  Discontinued     1,000 mg 250 mL/hr over 60 Minutes Intravenous Every 24 hours 07/13/18 1304 07/14/18 0752   07/14/18 1400  vancomycin (VANCOCIN) 1,250 mg in sodium chloride 0.9 % 250 mL IVPB  Status:  Discontinued     1,250 mg 166.7 mL/hr over 90 Minutes Intravenous Every 24 hours 07/14/18 0752 07/17/18 1541   07/14/18 1400  fluconazole (DIFLUCAN) IVPB 200 mg     200 mg 100 mL/hr over 60 Minutes Intravenous  Once 07/14/18 1303 07/14/18 1549   07/13/18 1345  vancomycin (VANCOCIN) 2,000 mg in sodium chloride 0.9 % 500 mL IVPB     2,000 mg 250 mL/hr over 120 Minutes Intravenous  Once 07/13/18 1301 07/13/18 1615      Subjective: Reports feeling somewhat thirsty this AM   Objective: Vitals:   07/29/18 2320 07/30/18 0331 07/30/18 0827 07/30/18 1220  BP: (!) 157/67 (!) 163/63 (!) 129/45 (!) 133/50  Pulse: 92 95 96 94  Resp: 18 16 14 20   Temp: 98 F (36.7 C) 98.4 F (36.9 C) 97.7 F (36.5 C) 98 F (36.7 C)  TempSrc: Oral Oral Oral   SpO2: 99% 100% 98%   Weight:      Height:        Intake/Output Summary (Last 24 hours) at 07/30/2018 1446 Last data filed at 07/30/2018 1000 Gross per 24 hour  Intake 840 ml  Output 2050 ml  Net -1210 ml   Filed Weights   07/11/18 1218  Weight: 90.7 kg    Examination: General exam: Conversant, in no acute distress Respiratory system: normal chest rise, clear, no audible wheezing Cardiovascular system: regular rhythm, s1-s2 Gastrointestinal system: Nondistended, nontender, pos BS Central nervous system: No seizures, no tremors Extremities: No cyanosis, no joint deformities Skin: No rashes, no pallor Psychiatry: Affect normal // no auditory hallucinations   Data Reviewed: I have personally reviewed following labs and imaging studies  CBC: Recent Labs  Lab 07/28/18 0829 07/28/18 1514 07/28/18 1716 07/28/18 1724 07/28/18 1855 07/29/18 0601  WBC 3.0*  --   --   --   --  7.3  HGB 9.6* 7.8* 10.5* 10.9* 9.5* 9.6*  HCT 31.2* 23.0* 31.0* 32.0* 28.0* 29.8*  MCV 79.2*  --   --   --   --  79.5*  PLT 262  --   --   --   --  628   Basic Metabolic Panel: Recent Labs  Lab 07/24/18 0326  07/25/18 0438 07/28/18 0829 07/28/18 1514 07/28/18 1716 07/28/18 1724 07/28/18 1855 07/29/18 0601  NA  --    < > 138 140 138 132* 136 138 138  K  --    < > 4.2 4.1 3.8 7.9* 4.6 4.2 3.7  CL  --   --  100 101  --   --   --   --  102  CO2  --   --  30 27  --   --   --   --  25  GLUCOSE  --   --  107* 131*  --  144* 156*  --  149*  BUN  --   --  22 14  --   --   --   --  11  CREATININE 0.94  --  0.91 0.95  --   --   --   --  0.78  CALCIUM  --   --  9.5 10.1  --   --   --   --  9.0   < > = values in this interval not  displayed.   GFR: Estimated Creatinine Clearance: 71 mL/min (by C-G formula based on SCr of 0.78 mg/dL). Liver Function Tests: Recent Labs  Lab 07/28/18 0829  AST 46*  ALT 39  ALKPHOS 95  BILITOT 0.5  PROT 6.7  ALBUMIN 3.2*   No results for input(s): LIPASE, AMYLASE in the last 168 hours. No results for input(s): AMMONIA in the last 168 hours. Coagulation Profile: No results for input(s): INR, PROTIME in the last 168 hours. Cardiac Enzymes: No results for input(s): CKTOTAL, CKMB, CKMBINDEX, TROPONINI in the last 168 hours. BNP (last 3 results) No results for input(s): PROBNP in the last 8760 hours. HbA1C: Recent Labs    07/28/18 0829  HGBA1C 5.7*   CBG: Recent Labs  Lab 07/29/18 1117 07/29/18 1628 07/29/18 2118 07/30/18 0735 07/30/18 1212  GLUCAP 152* 108* 123* 90 117*   Lipid Profile: No results for input(s): CHOL, HDL, LDLCALC, TRIG, CHOLHDL, LDLDIRECT in the last 72 hours. Thyroid Function Tests: No results for input(s): TSH, T4TOTAL, FREET4, T3FREE, THYROIDAB in the last 72 hours. Anemia Panel: No results for input(s): VITAMINB12, FOLATE, FERRITIN, TIBC, IRON, RETICCTPCT in the last 72 hours. Sepsis Labs: No results for input(s): PROCALCITON, LATICACIDVEN in the last 168 hours.  Recent Results (from the past 240 hour(s))  Surgical pcr screen     Status: Abnormal   Collection Time: 07/28/18 11:38 AM  Result Value Ref Range Status   MRSA, PCR NEGATIVE NEGATIVE Final   Staphylococcus aureus POSITIVE (A) NEGATIVE Final    Comment: (NOTE) The Xpert SA Assay (FDA approved for NASAL specimens in patients 56 years of age and older), is one component of a comprehensive surveillance program. It is not intended to diagnose infection nor to guide or monitor treatment. Performed at [facility], [place] [address] [place], [place] [address]      Radiology Studies: Dg Lumbar Spine 2-3 Views  Result Date: 07/28/2018 CLINICAL DATA:  Lumbar fusion EXAM: LUMBAR  SPINE - 2-3 VIEW COMPARISON:  CT dated 07/26/2018 FINDINGS: Extensive posterior fusion hardware is noted extending from the T10 to L4 level. A vertebral body spacer is noted at the L1-L2 level. The hardware is grossly intact. Multilevel degenerative changes are noted. IMPRESSION: Status  post posterior fusion as above. Electronically Signed   By: Constance Holster M.D.   On: 07/28/2018 20:08   Dg C-arm 1-60 Min  Result Date: 07/28/2018 CLINICAL DATA:  Posterior fusion EXAM: DG C-ARM 61-120 MIN COMPARISON:  Lumbar radiographs from the same day FINDINGS: Extensive posterior fusion hardware is noted extending from the T10 to L4 level. A vertebral body spacer is noted at the L1-L2 level. The hardware is grossly intact. Multilevel degenerative changes are noted. The total fluoroscopy time was 3 minutes and 27 seconds. Multiple images were saved. IMPRESSION: Fluoroscopy for lumbar fusion as above. Electronically Signed   By: Constance Holster M.D.   On: 07/28/2018 20:10    Scheduled Meds: . ARIPiprazole  5 mg Oral Daily  . clonazePAM  0.5 mg Oral QHS  . docusate sodium  100 mg Oral BID  . feeding supplement (ENSURE ENLIVE)  237 mL Oral TID BM  . feeding supplement (PRO-STAT SUGAR FREE 64)  30 mL Oral Daily  . fenofibrate  54 mg Oral Q supper  . fentaNYL  1 patch Transdermal Q72H  . FLUoxetine  60 mg Oral QHS  . gabapentin  300 mg Oral 3 times per day  . heparin injection (subcutaneous)  5,000 Units Subcutaneous Q8H  . insulin aspart  0-5 Units Subcutaneous QHS  . insulin aspart  0-9 Units Subcutaneous TID WC  . insulin glargine  20 Units Subcutaneous BID  . ketorolac  15 mg Intravenous Q6H  . metoprolol tartrate  12.5 mg Oral TID  . pantoprazole  40 mg Oral Daily  . propylthiouracil  75 mg Oral Daily  . rOPINIRole  3 mg Oral QHS  . senna  2 tablet Oral QHS  . senna  1 tablet Oral BID  . sodium chloride flush  3 mL Intravenous Q12H  . sodium chloride flush  3 mL Intravenous Q12H   Continuous  Infusions: . sodium chloride 10 mL/hr at 07/28/18 0400  . sodium chloride    . sodium chloride 250 mL (07/28/18 2200)  . lactated ringers    . methocarbamol (ROBAXIN) IV 500 mg (07/29/18 1300)  . vancomycin 1,500 mg (07/30/18 1006)     LOS: 19 days   Marylu Lund, MD [facility] Pager On Amion  If 7PM-7AM, please contact night-coverage 07/30/2018, 2:46 PM

## 2018-07-31 DIAGNOSIS — J45909 Unspecified asthma, uncomplicated: Secondary | ICD-10-CM | POA: Diagnosis not present

## 2018-07-31 DIAGNOSIS — E559 Vitamin D deficiency, unspecified: Secondary | ICD-10-CM | POA: Diagnosis not present

## 2018-07-31 DIAGNOSIS — G061 Intraspinal abscess and granuloma: Secondary | ICD-10-CM | POA: Diagnosis not present

## 2018-07-31 DIAGNOSIS — R0902 Hypoxemia: Secondary | ICD-10-CM | POA: Diagnosis not present

## 2018-07-31 DIAGNOSIS — E669 Obesity, unspecified: Secondary | ICD-10-CM | POA: Diagnosis not present

## 2018-07-31 DIAGNOSIS — F319 Bipolar disorder, unspecified: Secondary | ICD-10-CM | POA: Diagnosis not present

## 2018-07-31 DIAGNOSIS — N393 Stress incontinence (female) (male): Secondary | ICD-10-CM | POA: Diagnosis not present

## 2018-07-31 DIAGNOSIS — I700 Atherosclerosis of aorta: Secondary | ICD-10-CM | POA: Diagnosis not present

## 2018-07-31 DIAGNOSIS — E1169 Type 2 diabetes mellitus with other specified complication: Secondary | ICD-10-CM | POA: Diagnosis not present

## 2018-07-31 DIAGNOSIS — E0500 Thyrotoxicosis with diffuse goiter without thyrotoxic crisis or storm: Secondary | ICD-10-CM | POA: Diagnosis not present

## 2018-07-31 DIAGNOSIS — Z419 Encounter for procedure for purposes other than remedying health state, unspecified: Secondary | ICD-10-CM | POA: Diagnosis not present

## 2018-07-31 DIAGNOSIS — R52 Pain, unspecified: Secondary | ICD-10-CM | POA: Diagnosis not present

## 2018-07-31 DIAGNOSIS — F339 Major depressive disorder, recurrent, unspecified: Secondary | ICD-10-CM | POA: Diagnosis not present

## 2018-07-31 DIAGNOSIS — M4626 Osteomyelitis of vertebra, lumbar region: Secondary | ICD-10-CM | POA: Diagnosis not present

## 2018-07-31 DIAGNOSIS — Z7401 Bed confinement status: Secondary | ICD-10-CM | POA: Diagnosis not present

## 2018-07-31 DIAGNOSIS — I10 Essential (primary) hypertension: Secondary | ICD-10-CM | POA: Diagnosis not present

## 2018-07-31 DIAGNOSIS — N182 Chronic kidney disease, stage 2 (mild): Secondary | ICD-10-CM | POA: Diagnosis not present

## 2018-07-31 DIAGNOSIS — E119 Type 2 diabetes mellitus without complications: Secondary | ICD-10-CM | POA: Diagnosis not present

## 2018-07-31 DIAGNOSIS — F419 Anxiety disorder, unspecified: Secondary | ICD-10-CM | POA: Diagnosis not present

## 2018-07-31 DIAGNOSIS — G8929 Other chronic pain: Secondary | ICD-10-CM | POA: Diagnosis not present

## 2018-07-31 DIAGNOSIS — G062 Extradural and subdural abscess, unspecified: Secondary | ICD-10-CM | POA: Diagnosis not present

## 2018-07-31 DIAGNOSIS — M2550 Pain in unspecified joint: Secondary | ICD-10-CM | POA: Diagnosis not present

## 2018-07-31 DIAGNOSIS — Z794 Long term (current) use of insulin: Secondary | ICD-10-CM | POA: Diagnosis not present

## 2018-07-31 DIAGNOSIS — D631 Anemia in chronic kidney disease: Secondary | ICD-10-CM | POA: Diagnosis not present

## 2018-07-31 DIAGNOSIS — M4646 Discitis, unspecified, lumbar region: Secondary | ICD-10-CM | POA: Diagnosis not present

## 2018-07-31 DIAGNOSIS — G2581 Restless legs syndrome: Secondary | ICD-10-CM | POA: Diagnosis not present

## 2018-07-31 DIAGNOSIS — M797 Fibromyalgia: Secondary | ICD-10-CM | POA: Diagnosis not present

## 2018-07-31 DIAGNOSIS — M19049 Primary osteoarthritis, unspecified hand: Secondary | ICD-10-CM | POA: Diagnosis not present

## 2018-07-31 DIAGNOSIS — R2689 Other abnormalities of gait and mobility: Secondary | ICD-10-CM | POA: Diagnosis not present

## 2018-07-31 DIAGNOSIS — G4733 Obstructive sleep apnea (adult) (pediatric): Secondary | ICD-10-CM | POA: Diagnosis not present

## 2018-07-31 DIAGNOSIS — K219 Gastro-esophageal reflux disease without esophagitis: Secondary | ICD-10-CM | POA: Diagnosis not present

## 2018-07-31 DIAGNOSIS — M6281 Muscle weakness (generalized): Secondary | ICD-10-CM | POA: Diagnosis not present

## 2018-07-31 DIAGNOSIS — Z9989 Dependence on other enabling machines and devices: Secondary | ICD-10-CM | POA: Diagnosis not present

## 2018-07-31 LAB — GLUCOSE, CAPILLARY
Glucose-Capillary: 142 mg/dL — ABNORMAL HIGH (ref 70–99)
Glucose-Capillary: 182 mg/dL — ABNORMAL HIGH (ref 70–99)
Glucose-Capillary: 99 mg/dL (ref 70–99)

## 2018-07-31 LAB — POCT I-STAT 4, (NA,K, GLUC, HGB,HCT)
Glucose, Bld: 144 mg/dL — ABNORMAL HIGH (ref 70–99)
HCT: 31 % — ABNORMAL LOW (ref 36.0–46.0)
Hemoglobin: 10.5 g/dL — ABNORMAL LOW (ref 12.0–15.0)
Potassium: 7.9 mmol/L (ref 3.5–5.1)
Sodium: 132 mmol/L — ABNORMAL LOW (ref 135–145)

## 2018-07-31 LAB — SARS CORONAVIRUS 2 BY RT PCR (HOSPITAL ORDER, PERFORMED IN ~~LOC~~ HOSPITAL LAB): SARS Coronavirus 2: NEGATIVE

## 2018-07-31 MED ORDER — METHOCARBAMOL 500 MG PO TABS: 500.0000 mg | ORAL_TABLET | Freq: Four times a day (QID) | ORAL | 0 refills | Status: DC | PRN

## 2018-07-31 MED ORDER — INSULIN ASPART 100 UNIT/ML FLEXPEN: PEN_INJECTOR | SUBCUTANEOUS | 0 refills | Status: AC

## 2018-07-31 MED ORDER — BISACODYL 10 MG RE SUPP: 10.0000 mg | Freq: Every day | RECTAL | 0 refills | Status: DC | PRN

## 2018-07-31 MED ORDER — OXYCODONE HCL 10 MG PO TABS: 10.0000 mg | ORAL_TABLET | ORAL | 0 refills | Status: DC | PRN

## 2018-07-31 MED ORDER — METOPROLOL TARTRATE 25 MG PO TABS: 12.5000 mg | ORAL_TABLET | Freq: Three times a day (TID) | ORAL | 0 refills | Status: DC

## 2018-07-31 MED ORDER — DOCUSATE SODIUM 100 MG PO CAPS: 100.0000 mg | ORAL_CAPSULE | Freq: Two times a day (BID) | ORAL | 0 refills | Status: DC

## 2018-07-31 MED ORDER — FLEET ENEMA 7-19 GM/118ML RE ENEM: 1.0000 | ENEMA | Freq: Once | RECTAL | 0 refills | Status: DC | PRN

## 2018-07-31 MED ORDER — VANCOMYCIN IV (FOR PTA / DISCHARGE USE ONLY): 1500.0000 mg | INTRAVENOUS | 0 refills | Status: DC

## 2018-07-31 MED ORDER — GABAPENTIN 300 MG PO CAPS: 300.0000 mg | ORAL_CAPSULE | Freq: Three times a day (TID) | ORAL | 0 refills | Status: AC

## 2018-07-31 MED ORDER — SENNA 8.6 MG PO TABS: 2.0000 | ORAL_TABLET | Freq: Every day | ORAL | 0 refills | Status: DC

## 2018-07-31 MED ORDER — HEPARIN SOD (PORK) LOCK FLUSH 100 UNIT/ML IV SOLN
250.0000 [IU] | INTRAVENOUS | Status: AC | PRN
  Administered 2018-07-31: 250 [IU]

## 2018-07-31 MED ORDER — INSULIN GLARGINE 100 UNIT/ML ~~LOC~~ SOLN: 20.0000 [IU] | Freq: Two times a day (BID) | SUBCUTANEOUS | 0 refills | Status: DC

## 2018-07-31 MED FILL — Gelatin Absorbable MT Powder: OROMUCOSAL | Qty: 1 | Status: AC

## 2018-07-31 MED FILL — Thrombin For Soln 5000 Unit: CUTANEOUS | Qty: 5000 | Status: AC

## 2018-07-31 NOTE — TOC Progression Note (Signed)
Transition of Care [facility]) - Progression Note    Patient Details  Name: BRIANDA BEITLER MRN: [id number] Date of Birth: [birth year]  Transition of Care [facility]) CM/SW [place], [place] Phone Number: 07/31/2018, 1:16 PM  Clinical Narrative:    PASRR received, pt also has received a negative COVID screen.  Paged MD letting him know that pt has received both. Rosalie Gums- [phone]   Expected Discharge Plan: [place]    Expected Discharge Plan and Services Expected Discharge Plan: [place] In-house Referral: Clinical Social Work   Post Acute Care Choice: [place] Living arrangements for the past 2 months: Single Family Home Expected Discharge Date: (unknown)               DME Arranged: (TBD)   HH Arranged: PT, OT, RN [place] Agency: [place] ([facility])   Social Determinants of Health (SDOH) Interventions    Readmission Risk Interventions Readmission Risk Prevention Plan 07/26/2018  Transportation Screening Complete  Medication Review Press photographer) Complete  PCP or Specialist appointment within 3-5 days of discharge Complete  HRI or [place] Not Complete  HRI or Home Care Consult Pt Refusal Comments plan for pt to d/c to SNF  SW Recovery Care/Counseling Consult Complete  Palliative Care Screening Complete  Skilled Nursing Facility Complete  Some recent data might be hidden

## 2018-07-31 NOTE — TOC Progression Note (Signed)
Transition of Care [facility]) - Progression Note    Patient Details  Name: [name] MRN: [id number] Date of Birth: [birth year]  Transition of Care [facility]) CM/SW [place], [place] Phone Number: 07/31/2018, 10:09 AM  Clinical Narrative:    Await COVID screen requested by MD. [facility] has bed available.  PASRR pending.   Expected Discharge Plan: [place]    Expected Discharge Plan and Services Expected Discharge Plan: [place] In-house Referral: Clinical Social Work   Post Acute Care Choice: [place] Living arrangements for the past 2 months: Single Family Home Expected Discharge Date: (unknown)               DME Arranged: (TBD)         HH Arranged: PT, OT, RN [place] Agency: [place] ([facility])         Social Determinants of Health (SDOH) Interventions    Readmission Risk Interventions Readmission Risk Prevention Plan 07/26/2018  Transportation Screening Complete  Medication Review Press photographer) Complete  PCP or Specialist appointment within 3-5 days of discharge Complete  HRI or [place] Not Complete  HRI or Home Care Consult Pt Refusal Comments plan for pt to d/c to SNF  SW Recovery Care/Counseling Consult Complete  Palliative Care Screening Complete  Skilled Nursing Facility Complete  Some recent data might be hidden

## 2018-07-31 NOTE — TOC Transition Note (Signed)
Transition of Care [facility]) - CM/SW Discharge Note   Patient Details  Name: [name] MRN: [id number] Date of Birth: 09/16/1950  Transition of Care [facility]) CM/SW Contact:  [place], [place] Phone Number: 07/31/2018, 2:11 PM   Clinical Narrative:    Pt medically stable for d/c. Pt husband updated. Will arrange PTAR when all needed documentation completed by MD.   Final next level of care: Skilled Nursing Facility Barriers to Discharge: Barriers Resolved   Patient Goals and CMS Choice Patient states their goals for this hospitalization and ongoing recovery are:: Return Home with spouse CMS Medicare.gov Compare Post Acute Care list provided to:: Patient Choice offered to / list presented to : Spouse  Discharge Placement PASRR number recieved: 07/31/18            Patient [facility] bed at: [facility] Patient to be transferred to facility by: [place] Name of family member notified: pt husband via telephone Patient and family notified of of transfer: 07/31/18  Discharge Plan and Services In-house Referral: Clinical Social Work   Post Acute Care Choice: [place]          DME Arranged: (TBD)         HH Arranged: PT, OT, RN [place] Agency: [place] ([facility])        Social Determinants of Health (SDOH) Interventions     Readmission Risk Interventions Readmission Risk Prevention Plan 07/26/2018  Transportation Screening Complete  Medication Review Press photographer) Complete  PCP or Specialist appointment within 3-5 days of discharge Complete  HRI or [place] Not Complete  HRI or Home Care Consult Pt Refusal Comments plan for pt to d/c to SNF  SW Recovery Care/Counseling Consult Complete  Palliative Care Screening Complete  Skilled Nursing Facility Complete  Some recent data might be hidden

## 2018-07-31 NOTE — Progress Notes (Signed)
Patient ID: [name], female   DOB: [birth year], 68 y.o.   MRN: [id number] Patient has made good progress postop She is ambulated with assistance Back pain is feeling much better controlled We will continue rehab at SNF Follow-up as outpatient in about 2 weeks time

## 2018-07-31 NOTE — Social Work (Signed)
Clinical Social Worker facilitated patient discharge including contacting patient family and facility to confirm patient discharge plans.  Clinical information faxed to facility and family agreeable with plan.  CSW arranged ambulance transport via PTAR to [facility]. Pick up at 4:30pm. RN to call [phone]  with report prior to discharge.  Clinical Social Worker will sign off for now as social work intervention is no longer needed. Please consult [place] again if new need arises.  Westley Hummer, MSW, [place] Social Worker [phone]

## 2018-07-31 NOTE — Progress Notes (Signed)
Physical Therapy Treatment Patient Details Name: [name] MRN: [id number] DOB: 1950-08-22 Today's Date: 07/31/2018    History of Present Illness [name] is a 68 y.o. female with history of diabetes mellitus type 2, sleep apnea, hypertension, Graves' disease, chronic and disease, anemia. Received epidural injection 05/23/2018. She is currently being treated for a lumbar discitis.  Pt admitted with continued back pain and found to have progression of abscess in epidural space of lumbar spine associated with Discitis and Osteomyelitis of L1-L2, and Progressive Abscess in Left Psoas Muscle. S/p anterolateral decompression L1-2 for osteomyelitis, posterior fixation thoracic T10-L4 on 5/8     PT Comments    Patient progressing slowly towards PT goals. Pain seems better controlled today. Requires assist for bed mobility and transfers. Tolerated gait training with Min A for balance/safety with close chair follow secondary to bil knee instability and weakness. Pt with flat affect and slow processing. Education re: back precautions, brace, positioning, log roll technique etc. Will continue to follow and progress as tolerated.   Follow Up Recommendations  SNF;Supervision/Assistance - 24 hour     Equipment Recommendations  None recommended by PT    Recommendations for Other Services       Precautions / Restrictions Precautions Precautions: Fall;Back Precaution Booklet Issued: No Precaution Comments: Reviewed back precautions Required Braces or Orthoses: Spinal Brace Spinal Brace: Lumbar corset Restrictions Weight Bearing Restrictions: No    Mobility  Bed Mobility Overal bed mobility: Needs Assistance Bed Mobility: Rolling;Sidelying to Sit Rolling: Mod assist Sidelying to sit: Min assist;HOB elevated       General bed mobility comments: Cues for log roll technique, use of rail and assist with pad to roll and elevate trunk to get to EOB; posterior bias upon sitting EOB needing  assist.   Transfers Overall transfer level: Needs assistance Equipment used: Rolling walker (2 wheeled) Transfers: Sit to/from Stand Sit to Stand: Mod assist         General transfer comment: Assist to power to standing with cues for hand placement/technique. Stood from [employer], from chair x2.   Ambulation/Gait Ambulation/Gait assistance: Min assist Gait Distance (Feet): 40 Feet(+ 20') Assistive device: Rolling walker (2 wheeled) Gait Pattern/deviations: Step-through pattern;Decreased stride length;Trunk flexed;Shuffle Gait velocity: decreased   General Gait Details: Slow, unsteady gait with RW for support; veers left and needs assist with RW management. Bil knee instability noted with increased knee flexion. 1 seated rest break.   Stairs             Wheelchair Mobility    Modified Rankin (Stroke Patients Only)       Balance Overall balance assessment: Needs assistance Sitting-balance support: Bilateral upper extremity supported;Feet supported Sitting balance-Leahy Scale: Poor Sitting balance - Comments: posterior LOB x5-6, needed Min A for balance Postural control: Posterior lean Standing balance support: Bilateral upper extremity supported;During functional activity Standing balance-Leahy Scale: Poor Standing balance comment: reliant on external support and UEs support on RW                            Cognition   Behavior During Therapy: Flat affect Overall Cognitive Status: Impaired/Different from baseline Area of Impairment: Safety/judgement;Problem solving;Following commands                       Following Commands: Follows one step commands with increased time Safety/Judgement: Decreased awareness of safety;Decreased awareness of deficits   Problem Solving: Slow processing;Decreased initiation;Requires  verbal cues;Difficulty sequencing;Requires tactile cues General Comments: Very delayed processing, requires increased time for  following simple commands. "I can go home right" despite needing assist for all aspects of mobility      Exercises      General Comments        Pertinent Vitals/Pain Pain Assessment: Faces Faces Pain Scale: Hurts a little bit Pain Location: surgical site Pain Descriptors / Indicators: Operative site guarding;Grimacing;Sore Pain Intervention(s): Monitored during session;Repositioned;Premedicated before session    Home Living                      Prior Function            PT Goals (current goals can now be found in the care plan section) Progress towards PT goals: Progressing toward goals    Frequency    Min 5X/week      PT Plan Current plan remains appropriate    Co-evaluation              AM-PAC PT "6 Clicks" Mobility   Outcome Measure  Help needed turning from your back to your side while in a flat bed without using bedrails?: A Lot Help needed moving from lying on your back to sitting on the side of a flat bed without using bedrails?: A Lot Help needed moving to and from a bed to a chair (including a wheelchair)?: A Lot Help needed standing up from a chair using your arms (e.g., wheelchair or bedside chair)?: A Lot Help needed to walk in hospital room?: A Little Help needed climbing 3-5 steps with a railing? : Total 6 Click Score: 12    End of Session Equipment Utilized During Treatment: Gait belt;Back brace Activity Tolerance: Patient tolerated treatment well Patient left: in chair;with call bell/phone within reach;with chair alarm set Nurse Communication: Mobility status PT Visit Diagnosis: Other abnormalities of gait and mobility (R26.89);Muscle weakness (generalized) (M62.81);Difficulty in walking, not elsewhere classified (R26.2);Pain Pain - part of body: (back)     Time: 1517-6160 PT Time Calculation (min) (ACUTE ONLY): 23 min  Charges:  $Gait Training: 8-22 mins $Therapeutic Activity: 8-22 mins                     Wray Kearns, [place], DPT Acute Rehabilitation Services Pager [phone] Office [place] 07/31/2018, 10:51 AM

## 2018-07-31 NOTE — Progress Notes (Signed)
Patient discharged to facility of choice for rehabilitation. Patient's belongings with patient at time of discharge.  Patient's flowers given to spouse who came to lobby to pick up flowers.

## 2018-07-31 NOTE — Progress Notes (Signed)
RN gave report to receiving facility RN and all questions answered. Patient made aware of plans and now awaiting PTAR

## 2018-07-31 NOTE — Discharge Summary (Signed)
Physician Discharge Summary  [name] BTD:[id number] DOB: Mar 26, 1950 DOA: 07/11/2018  PCP: Crist Infante, MD  Admit date: 07/11/2018 Discharge date: 07/31/2018  Admitted From: Home Disposition:  SNF  Recommendations for Outpatient Follow-up:  1. Follow up with PCP in 1-2 weeks  Discharge Condition:Stable CODE STATUS:Full Diet recommendation: Diabetic   Brief/Interim Summary: Admitted with abscess in the epidural space of lumbar spine associated with discitis and osteomyelitis. Currently will require 4 weeks of IV vancomycin. Plan of care consulted for pain management. Neurosurgery currently following patient, with tentative plan for surgery next week, 07/28/18. Patient will be transferred to [facility].  Discharge Diagnoses:  Principal Problem:   Abscess in epidural space of lumbar spine Active Problems:   DM (diabetes mellitus) (HCC)   Hypothyroidism   Obesity   Graves disease   Pain   Palliative care by specialist   Abscess in epidural space of lumbar spine associated with discitis and osteomyelitis of L1-L2/myositis and bilateral psoas muscles, progressive abscess in left psoas muscle -Interventional radiology had been consulted and appreciated-wanted infectious disease and neurosurgery input -Neurosurgery initially consulted and advised that repeat needle biopsy of the elbow wound would be preferable to an open surgical decompression -Infectious disease consulted and appreciated, recommended drainage of the abscess and holding antibiotics pending culture, and then starting vancomycin- plan to continue through 08/12/2018 with reimaging after 4 weeks -Status post underwent CT aspiration but there was no significant drainage, only a few drops of blood aspirated from left psoas site -Blood culture showed no growth to date -Aerobic and anaerobic cultures and Gram stain showed no organisms -Case was rediscussed with neurosurgery, Dr. Zada Finders, felt that no surgical  recommendation was warranted at this time and recommended prolonged course of antibiotics -Continue pain control, palliative care was consulted for symptom management -PT and OT recommended SNF placement -Patient and husband initially would like for her to return home. Dr. Ree Kida discussed with husband on 07/19/2018 regarding discharge and SNF. Both patient and husband are opened to SNF placement. -Neurosurgery following. S/p surgery 5/8. PT following -Plan discharge to SNF today -Continue routine PICC care and remove PICC after completion of abx  Diabetes mellitus, type II -Continue Lantus, insulin sliding scale with CBG monitoring -currently stable  Essential hypertension -Continue metoprolol as tolerated -BP presently stable  Graves' disease -Continue PTU as tolerated  Fibromyalgia/depression/restless leg syndrome -Continued aripiprazole, fluoxetine, clonazepam, ropinirole while in hospital -currently stable  Chronic pain syndrome -Palliative care consulted and appreciated for pain control -currently stable.   Hyperlipidemia -Continue fenofibrate -Stable currently  Normocytic anemia -Anemia panel showed iron of 27, U IBC 247, TIBC 274, saturation ratio of 10%, ferritin 28, folate 15.7, B12 2063 -Stable, hemoglobin8.8,baseline appears between 8 and 9 -Remains hemodynamically stable  Hypokalemia -Resovled, continue to monitor  -potassium has remained within normal limits, labs reviewed  Hyponatremia -Resolved, continue to monitor  Oral candidiasis/thrush -Appears to be improving -Nurse had reported white patches on patient's tongue- currently appears to be clear and dry appearing -Completed course of fluconazole and nystatin -Presently stable  Obesity, morbid -BMI of 37.79 -Recommend diet/lifestyle modification   Discharge Instructions  Discharge Instructions    Home infusion instructions [place] May follow [place] Dosing  Protocol; May administer Cathflo as needed to maintain patency of vascular access device.; Flushing of vascular access device: per [facility] Protocol: 0.9% NaCl pre/post medica...   Complete by:  As directed    Instructions:  May follow [place] Dosing Protocol   Instructions:  May administer Cathflo as needed to maintain patency of vascular access device.   Instructions:  Flushing of vascular access device: per [facility] Protocol: 0.9% NaCl pre/post medication administration and prn patency; Heparin 100 u/ml, 12m for implanted ports and Heparin 10u/ml, 580mfor all other central venous catheters.   Instructions:  May follow [facility] Anaphylaxis Protocol for First Dose Administration in the home: 0.9% NaCl at 25-50 ml/hr to maintain IV access for protocol meds. Epinephrine 0.3 ml IV/IM PRN and Benadryl 25-50 IV/IM PRN s/s of anaphylaxis.   Instructions:  [facility] Coordinator (RN) to assist per patient IV care needs in the home PRN.   Incentive spirometry RT   Complete by:  As directed      Allergies as of 07/31/2018      Reactions   Imitrex [sumatriptan] Other (See Comments)   Vascular spasms   Nsaids Other (See Comments)   PT UNABLE TO TOLERATE NSAID'S DRUGS DUE TO HX OF GASTRIC SLEEVE SURGERY   Rosuvastatin Calcium Other (See Comments)   Leg/muscle pain   Statins Other (See Comments)   Leg/muscle pain   Sulfonamide Derivatives Hives   Childhood allergy   Codeine Nausea Only   Promethazine Hcl Other (See Comments)   Restless leg feeling all over body   Vicodin [hydrocodone-acetaminophen] Nausea And Vomiting      Medication List    STOP taking these medications   amLODipine 5 MG tablet Commonly known as:  NORVASC   furosemide 20 MG tablet Commonly known as:  LASIX   morphine 15 MG 12 hr tablet Commonly known as:  MS CONTIN   Movantik 25 MG Tabs tablet Generic drug:  naloxegol oxalate   Narcan 4 MG/0.1ML Liqd nasal spray kit Generic drug:  naloxone   NovoLOG 100  UNIT/ML injection Generic drug:  insulin aspart Replaced by:  insulin aspart 100 UNIT/ML FlexPen   potassium chloride SA 20 MEQ tablet Commonly known as:  K-DUR     TAKE these medications   ARIPiprazole 5 MG tablet Commonly known as:  ABILIFY Take 1 tablet (5 mg total) by mouth daily.   bisacodyl 10 MG suppository Commonly known as:  DULCOLAX Place 1 suppository (10 mg total) rectally daily as needed for moderate constipation.   clonazePAM 0.5 MG tablet Commonly known as:  KLONOPIN Take 0.5 mg by mouth at bedtime as needed for anxiety. Up to 3 times daily as needed for anxiety, take 1 tablet (0.5 mg) scheduled each night at bedtime.   cyclobenzaprine 10 MG tablet Commonly known as:  FLEXERIL Take 10 mg by mouth See admin instructions. Take 1 tablet (10 mg) every night at bedtime, may take an additional tablet during the day as needed for muscle spasms   docusate sodium 100 MG capsule Commonly known as:  COLACE Take 1 capsule (100 mg total) by mouth 2 (two) times daily.   fenofibrate 54 MG tablet Take 54 mg by mouth daily with supper.   FLUoxetine 20 MG capsule Commonly known as:  PROZAC Take 60 mg by mouth at bedtime.   gabapentin 300 MG capsule Commonly known as:  NEURONTIN Take 1 capsule (300 mg total) by mouth 3 (three) times daily for 30 days. What changed:    medication strength  how much to take   insulin aspart 100 UNIT/ML FlexPen Commonly known as:  NOVOLOG Sliding scale dose: CBG < 70: implement hypoglycemia protocol  CBG 70 - 120: 0 units  CBG 121 - 150: 0 units  CBG 151 -  200: 0 units  CBG 201 - 250: 2 units  CBG 251 - 300: 3 units  CBG 301 - 350: 4 units  CBG 351 - 400: 5 units Replaces:  NovoLOG 100 UNIT/ML injection   insulin glargine 100 UNIT/ML injection Commonly known as:  Lantus Inject 0.2 mLs (20 Units total) into the skin 2 (two) times daily. What changed:    how much to take  when to take this  additional instructions    methocarbamol 500 MG tablet Commonly known as:  ROBAXIN Take 1 tablet (500 mg total) by mouth every 6 (six) hours as needed for muscle spasms.   metoprolol tartrate 25 MG tablet Commonly known as:  LOPRESSOR Take 0.5 tablets (12.5 mg total) by mouth 3 (three) times daily for 30 days. What changed:    medication strength  how much to take   nystatin 100000 UNIT/ML suspension Commonly known as:  MYCOSTATIN Use as directed 5 mLs in the mouth or throat 4 (four) times daily.   omeprazole 20 MG capsule Commonly known as:  PRILOSEC Take 20 mg by mouth daily with supper.   ondansetron 4 MG tablet Commonly known as:  ZOFRAN Take 4 mg by mouth 2 (two) times daily as needed. For nausea/vomiting prevention with pain medication.   oxyCODONE 5 MG immediate release tablet Commonly known as:  Oxy IR/ROXICODONE Take 5-10 mg by mouth every 4 (four) hours as needed for moderate pain. What changed:  Another medication with the same name was added. Make sure you understand how and when to take each.   Oxycodone HCl 10 MG Tabs Take 1 tablet (10 mg total) by mouth every 4 (four) hours as needed for moderate pain or severe pain (first line rescue medication). What changed:  You were already taking a medication with the same name, and this prescription was added. Make sure you understand how and when to take each.   PreviDent 5000 Dry Mouth 1.1 % Gel dental gel Generic drug:  sodium fluoride Take 1 application by mouth daily.   propylthiouracil 50 MG tablet Commonly known as:  PTU Take 75 mg by mouth daily.   rOPINIRole 2 MG tablet Commonly known as:  REQUIP Take 3 mg by mouth at bedtime.   senna 8.6 MG Tabs tablet Commonly known as:  SENOKOT Take 2 tablets (17.2 mg total) by mouth at bedtime.   sodium phosphate 7-19 GM/118ML Enem Place 133 mLs (1 enema total) rectally once as needed for severe constipation.   vancomycin  IVPB Inject 1,500 mg into the vein daily for 14 days.  Indication:  Discitis / Osteomyelitis Last Day of Therapy:  08/12/18 Labs - Sunday/Monday:  CBC/D, BMP, and vancomycin trough. Labs - Thursday:  BMP and vancomycin trough Labs - Every other week:  ESR and CRP What changed:    how much to take  additional instructions   VITAMIN B 12 PO Take 1 tablet by mouth daily.   VITAMIN D3 PO Take 1 tablet by mouth daily.            Home Infusion Instuctions  (From admission, onward)         Start     Ordered   07/31/18 0000  Home infusion instructions [facility] May follow [place] Dosing Protocol; May administer Cathflo as needed to maintain patency of vascular access device.; Flushing of vascular access device: per [facility] Protocol: 0.9% NaCl pre/post medica...    Question Answer Comment  Instructions May follow [place]  Protocol   Instructions May administer Cathflo as needed to maintain patency of vascular access device.   Instructions Flushing of vascular access device: per [facility] Protocol: 0.9% NaCl pre/post medication administration and prn patency; Heparin 100 u/ml, 32m for implanted ports and Heparin 10u/ml, 581mfor all other central venous catheters.   Instructions May follow [facility] Anaphylaxis Protocol for First Dose Administration in the home: 0.9% NaCl at 25-50 ml/hr to maintain IV access for protocol meds. Epinephrine 0.3 ml IV/IM PRN and Benadryl 25-50 IV/IM PRN s/s of anaphylaxis.   Instructions [facility] Infusion Coordinator (RN) to assist per patient IV care needs in the home PRN.      07/31/18 1400         Contact information for after-discharge care    [facility]   Service:  Skilled Nursing Contact information: [phone]           Allergies  Allergen Reactions  . Imitrex [Sumatriptan] Other (See Comments)    Vascular spasms  . Nsaids Other (See Comments)    PT UNABLE TO TOLERATE  NSAID'S DRUGS DUE TO HX OF GASTRIC SLEEVE SURGERY  . Rosuvastatin Calcium Other (See Comments)    Leg/muscle pain  . Statins Other (See Comments)    Leg/muscle pain  . Sulfonamide Derivatives Hives    Childhood allergy   . Codeine Nausea Only  . Promethazine Hcl Other (See Comments)    Restless leg feeling all over body  . Vicodin [Hydrocodone-Acetaminophen] Nausea And Vomiting    Consultations:  Neurosurgery  ID  Procedures/Studies: Dg Lumbar Spine 2-3 Views  Result Date: 07/28/2018 CLINICAL DATA:  Lumbar fusion EXAM: LUMBAR SPINE - 2-3 VIEW COMPARISON:  CT dated 07/26/2018 FINDINGS: Extensive posterior fusion hardware is noted extending from the T10 to L4 level. A vertebral body spacer is noted at the L1-L2 level. The hardware is grossly intact. Multilevel degenerative changes are noted. IMPRESSION: Status post posterior fusion as above. Electronically Signed   By: ChConstance Holster.D.   On: 07/28/2018 20:08   Ct Thoracic Spine Wo Contrast  Result Date: 07/26/2018 CLINICAL DATA:  [age]/o F; pre-surgical evaluation, robotic protocol, imaging to include T8-L4. L1-2 discitis osteomyelitis. EXAM: CT THORACIC SPINE WITHOUT CONTRAST TECHNIQUE: Multidetector CT images of the thoracic and lumbar spine from T6-L5 were obtained using the standard protocol without intravenous contrast. COMPARISON:  07/20/2018 CT lumbar spine. 07/11/2018 lumbar spine MRI. 01/06/2017 CT chest. FINDINGS: Alignment: L5-S1 grade 1 anterolisthesis. Vertebrae: Stable findings of severe discitis osteomyelitis at the L1-2 level with opposing endplate destruction and widening of the intervertebral disc space. There is a ventral epidural collection which is narrowing the spinal canal better characterized on the prior lumbar spine MRI. Bony fragments extend into the adjacent psoas muscles with there are phlegmon/abscess, suboptimally assessed in the absence of intravenous contrast. No new level of fracture or malalignment.  Paraspinal and other soft tissues: Cholecystectomy. Bilateral nonobstructive nephrolithiasis. Aortic calcific atherosclerosis. Disc levels: Multilevel discogenic degenerative changes of the thoracic spine with loss of intervertebral disc space height, small disc displacements, and endplate marginal osteophytes. Spondylosis of the lumbar spine results in mild multilevel loss of intervertebral disc space height. Combined with prominent facet arthropathy there is no foraminal stenosis bilaterally at the L5-S1 level. IMPRESSION: 1. Spine visualized from T6 through L5. 2. Stable findings of severe discitis osteomyelitis at L1-2 level with opposing endplate destruction, bilateral psoas abscess, and  ventral epidural abscess narrowing the spinal canal. Electronically Signed   By: Kristine Garbe M.D.   On: 07/26/2018 19:41   Ct Lumbar Spine Wo Contrast  Result Date: 07/20/2018 CLINICAL DATA:  Discitis-osteomyelitis, MRSA, with worsening low back pain EXAM: CT LUMBAR SPINE WITHOUT CONTRAST TECHNIQUE: Multidetector CT imaging of the lumbar spine was performed without intravenous contrast administration. Multiplanar CT image reconstructions were also generated. COMPARISON:  CT lumbar spine 05/31/2018 MRI lumbar spine 06/02/2018, 07/11/2018. FINDINGS: Segmentation: 5 lumbar type vertebrae. Alignment: Unchanged grade 1 anterolisthesis at L5-S1. Vertebrae: There is severe endplate erosion at Q9-V6, markedly progressed from the prior CT 05/31/2018. Comparison to the MRI of 07/11/2018 is limited by the difference in modality, but the degree of osseous destruction is approximately unchanged. Paraspinal and other soft tissues: There is paravertebral phlegmon at the L1-L2 levels with a soft tissue component extending into the right ventral epidural space (4:23). Disc levels: At L1-2, there is moderate attenuation of the thecal sac due to compression by ventral epidural collection. There is severe narrowing of both neural  foramina, unchanged. There is severe L4-5 and L5-S1 facet arthrosis. IMPRESSION: 1. Severe L1-2 discitis-osteomyelitis with unchanged degree of endplate and vertebral body destruction compared to 07/11/2018, allowing for differences in modality. 2. Right ventral epidural collection at L1-2, moderately narrowing the spinal canal and most consistent with epidural abscess, as previously demonstrated. 3. L1-2 paravertebral phlegmon with edema of the psoas muscles. No discrete fluid collection identified on this study. Electronically Signed   By: Ulyses Jarred M.D.   On: 07/20/2018 16:37   Mr Lumbar Spine W Wo Contrast  Result Date: 07/11/2018 CLINICAL DATA:  Spinal infection.  Increased back pain. EXAM: MRI LUMBAR SPINE WITHOUT AND WITH CONTRAST TECHNIQUE: Multiplanar and multiecho pulse sequences of the lumbar spine were obtained without and with intravenous contrast. CONTRAST:  10 mL Gadovist IV COMPARISON:  MRI lumbar spine 06/02/2018 FINDINGS: Segmentation:  Image quality degraded by significant motion Normal segmentation Alignment: Approximately 5 mm retrolisthesis L2-3 unchanged. Slight anterolisthesis L5-S1. Vertebrae: Progressive discitis and osteomyelitis at L1-2. Increased fluid in the disc space with endplate erosive changes which have progressed significantly. Diffuse bone marrow edema and enhancement throughout L1 and L2 vertebral bodies. No other levels of infection in the spine. Conus medullaris and cauda equina: Conus extends to the L1-2 level. Conus and cauda equina appear normal. Paraspinal and other soft tissues: Extensive paraspinous soft tissue thickening and enhancement around the L1-2 disc space compatible with infection in the soft tissues. 1 cm anterior paraspinous soft tissue abscesses have become more well-defined. Left psoas abscess measuring 17 mm has become more well-defined and enlarged. Diffuse myositis with enhancement throughout the psoas muscle bilaterally also with progression.  Probable myositis in the erector spinae muscles bilaterally which shows diffuse enhancement. Multilocular cystic mass in the uncinate process of the pancreas measuring approximately 25 x 30 mm unchanged from prior studies. See MRI pancreas report 06-24-18. Disc levels: T12-L1: Negative disc space. Ventral epidural fluid collection extending from T11 through L1 has improved in the interval although today's study is degraded by significant motion. L1-2: Progression of discitis and osteomyelitis with progressive endplate destruction. 4 mm ventral epidural abscess in the midline appears similar to the prior study. Diffuse dural thickening and enhancement at L1 and L2 likely has progressed although the prior study was not done with intravenous contrast. Nonenhancing extradural fluid collection posteriorly on the left has progressed. L2-3: 5 mm retrolisthesis. Advanced disc degeneration with disc bulging and spurring. Bilateral facet  degeneration. Severe spinal stenosis unchanged from the prior study. Extradural fluid collection on the left of the thecal sac at L2-3 has progressed and may represent abscess or reactive effusion. L3-4: Disc bulging and spurring. Bilateral facet degeneration. Mild spinal stenosis L4-5: Mild anterolisthesis. Disc and facet degeneration with mild spinal stenosis L5-S1: Mild anterolisthesis with moderate facet degeneration. Moderate subarticular stenosis bilaterally. IMPRESSION: 1. Image quality degraded by significant motion 2. Progression of discitis and osteomyelitis at L1-2. Progressive fluid/pus in the disc space extending into the anterior paraspinous soft tissues compatible with progressive abscess. Progressive abscess in the left psoas muscle. Progressive myositis throughout the psoas muscles bilaterally and the erector spinae muscles bilaterally. Posterior extradural fluid collection on the left at L1-2 has progressed in the interval and may represent abscess. Diffuse dural  thickening and small ventral epidural abscess at L1-2 difficult to compare as intravenous contrast not given on the prior study. No new levels of infection 3. Multilevel spondylosis with severe spinal stenosis at L2-3 due to degenerative change. Electronically Signed   By: Franchot Gallo M.D.   On: 07/11/2018 15:04   Ct Aspiration  Result Date: 07/13/2018 INDICATION: 68 year old with history of L1-L2 discitis. Recent MRI demonstrated progression of the discitis and osteomyelitis at L1-L2 with fluid extending into the paraspinal soft tissues including the left psoas muscle. Previous disc aspiration on 06/03/2018. Request for repeat aspiration and culture. EXAM: CT-GUIDED LEFT PSOAS MUSCLE ASPIRATION MEDICATIONS: No antibiotics given for the procedure. ANESTHESIA/SEDATION: Fentanyl 100 mcg IV; Versed 3.0 mg IV Moderate Sedation Time:  39 minutes The patient was continuously monitored during the procedure by the interventional radiology nurse under my direct supervision. COMPLICATIONS: None immediate. PROCEDURE: Informed written consent was obtained from the patient after a thorough discussion of the procedural risks, benefits and alternatives. All questions were addressed. A timeout was performed prior to the initiation of the procedure. CT images were obtained with the patient in prone position. The left psoas musculature at the level of L1-L2 was targeted. The overlying skin was prepped with chlorhexidine and a sterile field was created. Skin and soft tissues were anesthetized with 1% lidocaine. 18 gauge trocar needle was directed into the left psoas muscle multiple times. No purulent fluid could be aspirated. Few drops of bloody fluid was aspirated as the needle was withdrawn. Needle was repositioned multiple times. FINDINGS: Inflammatory changes at L1-L2 compatible with discitis. Needle was directed into the left psoas muscle at L1-L2. Discrete fluid collections are not clearly identified with CT. Needle  position was directed based on the recent MRI findings. No purulent fluid was aspirated. Few drops of bloody fluid was removed as the needles were pulled back. IMPRESSION: CT-guided aspiration of the left psoas muscle. Few drops of bloody fluid was obtained from the left psoas musculature. No purulent fluid obtained. If the culture samples are negative, we could consider another dedicated disc aspiration. Electronically Signed   By: Markus Daft M.D.   On: 07/13/2018 12:37   Dg C-arm 1-60 Min  Result Date: 07/28/2018 CLINICAL DATA:  Posterior fusion EXAM: DG C-ARM 61-120 MIN COMPARISON:  Lumbar radiographs from the same day FINDINGS: Extensive posterior fusion hardware is noted extending from the T10 to L4 level. A vertebral body spacer is noted at the L1-L2 level. The hardware is grossly intact. Multilevel degenerative changes are noted. The total fluoroscopy time was 3 minutes and 27 seconds. Multiple images were saved. IMPRESSION: Fluoroscopy for lumbar fusion as above. Electronically Signed   By: Jamie Kato.D.  On: 07/28/2018 20:10     Subjective: Without complaints this AM  Discharge Exam: Vitals:   07/31/18 0835 07/31/18 1215  BP: (!) 141/54 (!) 101/38  Pulse: 99 89  Resp: (!) 22 20  Temp: 98.2 F (36.8 C) 98.8 F (37.1 C)  SpO2: 96% 100%   Vitals:   07/31/18 0100 07/31/18 0346 07/31/18 0835 07/31/18 1215  BP: (!) 129/54 140/65 (!) 141/54 (!) 101/38  Pulse: 85 90 99 89  Resp: 20 18 (!) 22 20  Temp: 98.3 F (36.8 C) 97.8 F (36.6 C) 98.2 F (36.8 C) 98.8 F (37.1 C)  TempSrc: Oral Oral Oral   SpO2: 96% 98% 96% 100%  Weight:  92.2 kg    Height:        General: Pt is alert, awake, not in acute distress Cardiovascular: RRR, S1/S2 +, no rubs, no gallops Respiratory: CTA bilaterally, no wheezing, no rhonchi Abdominal: Soft, NT, ND, bowel sounds + Extremities: no edema, no cyanosis   The results of significant diagnostics from this hospitalization (including  imaging, microbiology, ancillary and laboratory) are listed below for reference.     Microbiology: Recent Results (from the past 240 hour(s))  Surgical pcr screen     Status: Abnormal   Collection Time: 07/28/18 11:38 AM  Result Value Ref Range Status   MRSA, PCR NEGATIVE NEGATIVE Final   Staphylococcus aureus POSITIVE (A) NEGATIVE Final    Comment: (NOTE) The Xpert SA Assay (FDA approved for NASAL specimens in patients 24 years of age and older), is one component of a comprehensive surveillance program. It is not intended to diagnose infection nor to guide or monitor treatment. Performed at [facility], [place] [address], [place], [place] [address]   SARS Coronavirus 2 (CEPHEID - Performed in [place] hospital lab), Hosp Order     Status: None   Collection Time: 07/31/18 10:30 AM  Result Value Ref Range Status   SARS Coronavirus 2 NEGATIVE NEGATIVE Final    Comment: (NOTE) If result is NEGATIVE SARS-CoV-2 target nucleic acids are NOT DETECTED. The SARS-CoV-2 RNA is generally detectable in upper and lower  respiratory specimens during the acute phase of infection. The lowest  concentration of SARS-CoV-2 viral copies this assay can detect is 250  copies / mL. A negative result does not preclude SARS-CoV-2 infection  and should not be used as the sole basis for treatment or other  patient management decisions.  A negative result may occur with  improper specimen collection / handling, submission of specimen other  than nasopharyngeal swab, presence of viral mutation(s) within the  areas targeted by this assay, and inadequate number of viral copies  (<250 copies / mL). A negative result must be combined with clinical  observations, patient history, and epidemiological information. If result is POSITIVE SARS-CoV-2 target nucleic acids are DETECTED. The SARS-CoV-2 RNA is generally detectable in upper and lower  respiratory specimens dur ing the acute phase of infection.   Positive  results are indicative of active infection with SARS-CoV-2.  Clinical  correlation with patient history and other diagnostic information is  necessary to determine patient infection status.  Positive results do  not rule out bacterial infection or co-infection with other viruses. If result is PRESUMPTIVE POSTIVE SARS-CoV-2 nucleic acids MAY BE PRESENT.   A presumptive positive result was obtained on the submitted specimen  and confirmed on repeat testing.  While 2019 novel coronavirus  (SARS-CoV-2) nucleic acids may be present in the submitted sample  additional confirmatory  testing may be necessary for epidemiological  and / or clinical management purposes  to differentiate between  SARS-CoV-2 and other Sarbecovirus currently known to infect humans.  If clinically indicated additional testing with an alternate test  methodology [phone]) is advised. The SARS-CoV-2 RNA is generally  detectable in upper and lower respiratory sp ecimens during the acute  phase of infection. The expected result is Negative. Fact Sheet for Patients:  StrictlyIdeas.no Fact Sheet for Healthcare Providers: BankingDealers.co.za This test is not yet approved or cleared by the [place] FDA and has been authorized for detection and/or diagnosis of SARS-CoV-2 by FDA under an Emergency Use Authorization (EUA).  This EUA will remain in effect (meaning this test can be used) for the duration of the COVID-19 declaration under Section 564(b)(1) of the Act, 21 U.S.C. section 360bbb-3(b)(1), unless the authorization is terminated or revoked sooner. Performed at [facility], [place] [address] [place], [place] [address]      Labs: BNP (last 3 results) No results for input(s): BNP in the last 8760 hours. Basic Metabolic Panel: Recent Labs  Lab 07/25/18 0438 07/28/18 0829 07/28/18 1514 07/28/18 1716 07/28/18 1724 07/28/18 1855 07/29/18 0601  NA  138 140 138 132* 136 138 138  K 4.2 4.1 3.8 7.9* 4.6 4.2 3.7  CL 100 101  --   --   --   --  102  CO2 30 27  --   --   --   --  25  GLUCOSE 107* 131*  --  144* 156*  --  149*  BUN 22 14  --   --   --   --  11  CREATININE 0.91 0.95  --   --   --   --  0.78  CALCIUM 9.5 10.1  --   --   --   --  9.0   Liver Function Tests: Recent Labs  Lab 07/28/18 0829  AST 46*  ALT 39  ALKPHOS 95  BILITOT 0.5  PROT 6.7  ALBUMIN 3.2*   No results for input(s): LIPASE, AMYLASE in the last 168 hours. No results for input(s): AMMONIA in the last 168 hours. CBC: Recent Labs  Lab 07/28/18 0829 07/28/18 1514 07/28/18 1716 07/28/18 1724 07/28/18 1855 07/29/18 0601  WBC 3.0*  --   --   --   --  7.3  HGB 9.6* 7.8* 10.5* 10.9* 9.5* 9.6*  HCT 31.2* 23.0* 31.0* 32.0* 28.0* 29.8*  MCV 79.2*  --   --   --   --  79.5*  PLT 262  --   --   --   --  235   Cardiac Enzymes: No results for input(s): CKTOTAL, CKMB, CKMBINDEX, TROPONINI in the last 168 hours. BNP: Invalid input(s): POCBNP CBG: Recent Labs  Lab 07/30/18 1212 07/30/18 1626 07/30/18 2136 07/31/18 0821 07/31/18 1156  GLUCAP 117* 120* 141* 142* 182*   D-Dimer No results for input(s): DDIMER in the last 72 hours. Hgb A1c No results for input(s): HGBA1C in the last 72 hours. Lipid Profile No results for input(s): CHOL, HDL, LDLCALC, TRIG, CHOLHDL, LDLDIRECT in the last 72 hours. Thyroid function studies No results for input(s): TSH, T4TOTAL, T3FREE, THYROIDAB in the last 72 hours.  Invalid input(s): FREET3 Anemia work up No results for input(s): VITAMINB12, FOLATE, FERRITIN, TIBC, IRON, RETICCTPCT in the last 72 hours. Urinalysis    Component Value Date/Time   COLORURINE YELLOW 07/14/2018 0914   APPEARANCEUR CLEAR 07/14/2018 0914   LABSPEC 1.013 07/14/2018  [place] 6.0 07/14/2018 0914   GLUCOSEU NEGATIVE 07/14/2018 0914   HGBUR NEGATIVE 07/14/2018 0914   BILIRUBINUR NEGATIVE 07/14/2018 0914   KETONESUR 5 (A) 07/14/2018  0914   PROTEINUR NEGATIVE 07/14/2018 0914   UROBILINOGEN 0.2 07/20/2014 1707   NITRITE NEGATIVE 07/14/2018 0914   LEUKOCYTESUR NEGATIVE 07/14/2018 0914   Sepsis Labs Invalid input(s): PROCALCITONIN,  WBC,  LACTICIDVEN Microbiology Recent Results (from the past 240 hour(s))  Surgical pcr screen     Status: Abnormal   Collection Time: 07/28/18 11:38 AM  Result Value Ref Range Status   MRSA, PCR NEGATIVE NEGATIVE Final   Staphylococcus aureus POSITIVE (A) NEGATIVE Final    Comment: (NOTE) The Xpert SA Assay (FDA approved for NASAL specimens in patients 35 years of age and older), is one component of a comprehensive surveillance program. It is not intended to diagnose infection nor to guide or monitor treatment. Performed at [facility], [place] [address], [place], [place] [address]   SARS Coronavirus 2 (CEPHEID - Performed in [place] hospital lab), Hosp Order     Status: None   Collection Time: 07/31/18 10:30 AM  Result Value Ref Range Status   SARS Coronavirus 2 NEGATIVE NEGATIVE Final    Comment: (NOTE) If result is NEGATIVE SARS-CoV-2 target nucleic acids are NOT DETECTED. The SARS-CoV-2 RNA is generally detectable in upper and lower  respiratory specimens during the acute phase of infection. The lowest  concentration of SARS-CoV-2 viral copies this assay can detect is 250  copies / mL. A negative result does not preclude SARS-CoV-2 infection  and should not be used as the sole basis for treatment or other  patient management decisions.  A negative result may occur with  improper specimen collection / handling, submission of specimen other  than nasopharyngeal swab, presence of viral mutation(s) within the  areas targeted by this assay, and inadequate number of viral copies  (<250 copies / mL). A negative result must be combined with clinical  observations, patient history, and epidemiological information. If result is POSITIVE SARS-CoV-2 target nucleic acids are  DETECTED. The SARS-CoV-2 RNA is generally detectable in upper and lower  respiratory specimens dur ing the acute phase of infection.  Positive  results are indicative of active infection with SARS-CoV-2.  Clinical  correlation with patient history and other diagnostic information is  necessary to determine patient infection status.  Positive results do  not rule out bacterial infection or co-infection with other viruses. If result is PRESUMPTIVE POSTIVE SARS-CoV-2 nucleic acids MAY BE PRESENT.   A presumptive positive result was obtained on the submitted specimen  and confirmed on repeat testing.  While 2019 novel coronavirus  (SARS-CoV-2) nucleic acids may be present in the submitted sample  additional confirmatory testing may be necessary for epidemiological  and / or clinical management purposes  to differentiate between  SARS-CoV-2 and other Sarbecovirus currently known to infect humans.  If clinically indicated additional testing with an alternate test  methodology [phone]) is advised. The SARS-CoV-2 RNA is generally  detectable in upper and lower respiratory sp ecimens during the acute  phase of infection. The expected result is Negative. Fact Sheet for Patients:  StrictlyIdeas.no Fact Sheet for Healthcare Providers: BankingDealers.co.za This test is not yet approved or cleared by the [place] FDA and has been authorized for detection and/or diagnosis of SARS-CoV-2 by FDA under an Emergency Use Authorization (EUA).  This EUA will remain in effect (meaning this test can be used) for the  duration of the COVID-19 declaration under Section 564(b)(1) of the Act, 21 U.S.C. section 360bbb-3(b)(1), unless the authorization is terminated or revoked sooner. Performed at [facility], [place] [address], [place], [place] [address]    Time spent: 33mn  SIGNED:   SMarylu Lund MD  [facility] 07/31/2018, 2:08 PM  If  7PM-7AM, please contact night-coverage

## 2018-07-31 NOTE — Care Management Important Message (Signed)
Important Message  Patient Details  Name: [name] MRN: [id number] Date of Birth: May 26, 1950   Medicare Important Message Given:  Yes    Orbie Pyo 07/31/2018, 4:14 PM

## 2018-08-01 ENCOUNTER — Encounter (HOSPITAL_COMMUNITY): Payer: Self-pay | Admitting: Neurological Surgery

## 2018-08-01 DIAGNOSIS — I10 Essential (primary) hypertension: Secondary | ICD-10-CM | POA: Diagnosis not present

## 2018-08-01 DIAGNOSIS — E669 Obesity, unspecified: Secondary | ICD-10-CM | POA: Diagnosis not present

## 2018-08-01 DIAGNOSIS — G061 Intraspinal abscess and granuloma: Secondary | ICD-10-CM | POA: Diagnosis not present

## 2018-08-01 DIAGNOSIS — E119 Type 2 diabetes mellitus without complications: Secondary | ICD-10-CM | POA: Diagnosis not present

## 2018-08-01 LAB — TYPE AND SCREEN
ABO/RH(D): A NEG
Antibody Screen: NEGATIVE
Unit division: 0
Unit division: 0
Unit division: 0
Unit division: 0

## 2018-08-01 LAB — BPAM RBC
Blood Product Expiration Date: 202005162359
Blood Product Expiration Date: 202005162359
Blood Product Expiration Date: 202005192359
Blood Product Expiration Date: 202005262359
ISSUE DATE / TIME: 202005081421
ISSUE DATE / TIME: 202005081421
ISSUE DATE / TIME: 202005081825
ISSUE DATE / TIME: 202005081825
Unit Type and Rh: 600
Unit Type and Rh: 600
Unit Type and Rh: 600
Unit Type and Rh: 600

## 2018-08-02 ENCOUNTER — Ambulatory Visit (INDEPENDENT_AMBULATORY_CARE_PROVIDER_SITE_OTHER): Payer: Medicare Other | Admitting: Internal Medicine

## 2018-08-02 ENCOUNTER — Other Ambulatory Visit: Payer: Self-pay

## 2018-08-02 DIAGNOSIS — M4646 Discitis, unspecified, lumbar region: Secondary | ICD-10-CM | POA: Diagnosis not present

## 2018-08-02 MED ORDER — VANCOMYCIN IV (FOR PTA / DISCHARGE USE ONLY): 1500.0000 mg | INTRAVENOUS | Status: AC

## 2018-08-02 MED ORDER — DOXYCYCLINE HYCLATE 100 MG PO TABS: 100.0000 mg | ORAL_TABLET | Freq: Two times a day (BID) | ORAL | Status: DC

## 2018-08-02 NOTE — Progress Notes (Signed)
Virtual Visit via Telephone Note  I connected with KAYANI RAPAPORT on 08/02/18 at  2:00 PM EDT by telephone and verified that I am speaking with the correct person using two identifiers.  Location: Patient: [facility] Provider: [facility]   I discussed the limitations, risks, security and privacy concerns of performing an evaluation and management service by telephone and the availability of in person appointments. I also discussed with the patient that there may be a patient responsible charge related to this service. The patient expressed understanding and agreed to proceed.   History of Present Illness: I called and spoke with Ms. Minium by phone at her skilled nursing facility. She is a 68 y.o. female with chronic back pain and diabetes.  She recently had an epidural injection on 05/23/2018. Shortly afterwards she developed acute on chronic low back pain.  She was seen in the ED on 05/31/2018 and an abdominal CT scan showed what appeared to be a pancreatic mass.  She returned with increasing pain and was admitted on 06/02/2018.  CT and MRI revealed L1-2 discitis with a thin epidural abscess.    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  She was started on vancomycin and I recommended 6 weeks of total antibiotic therapy to be completed on 07/16/2018.  She was discharged.   She was readmitted on 07/11/2018 because of increasing back pain.  MRI revealed progressive deterioration of the L1-2 disc space with more prominent osteomyelitis and epidural abscess.  She underwent aspiration of the abscess on 07/13/2018.  Gram stain and cultures were negative.  Vancomycin was continued.  She underwent L1 to decompression and T10-L4 fusion on 07/28/2018.  No specimens were sent for stain or culture.  She was discharged on 07/31/2018 to her skilled nursing facility.  She feels like she is making slow progress.  She is now doing some walking with her physical therapist  with the aid of a walker.  Her pain has improved slightly.  She is tolerating her vancomycin and PIC well.  When seen in the hospital by my partner a decision was made to extend her IV vancomycin for a full 10 weeks through 08/12/2018.     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  She seems to be making slow progress after her recent surgery.  I favor extending therapy with oral doxycycline after she completes IV vancomycin on 08/12/2018.  Follow Up Instructions: We will give orders to her skilled nursing facility to discontinue IV vancomycin on 08/12/2018 and have her pick removed. Start doxycycline 100 mg twice daily on 08/13/2018 Follow-up here in 1 month   I discussed the assessment and treatment plan with the patient. The patient was provided an opportunity to ask questions and all were answered. The patient agreed with the plan and demonstrated an understanding of the instructions.   The patient was advised to call back or seek an in-person evaluation if the symptoms worsen or if the condition fails to improve as anticipated.  I provided 17 minutes of non-face-to-face time during this encounter.   Michel Bickers, MD

## 2018-08-04 ENCOUNTER — Other Ambulatory Visit: Payer: Self-pay | Admitting: *Deleted

## 2018-08-04 DIAGNOSIS — G2581 Restless legs syndrome: Secondary | ICD-10-CM | POA: Diagnosis not present

## 2018-08-04 DIAGNOSIS — G8929 Other chronic pain: Secondary | ICD-10-CM | POA: Diagnosis not present

## 2018-08-04 DIAGNOSIS — G061 Intraspinal abscess and granuloma: Secondary | ICD-10-CM | POA: Diagnosis not present

## 2018-08-04 DIAGNOSIS — M4626 Osteomyelitis of vertebra, lumbar region: Secondary | ICD-10-CM | POA: Diagnosis not present

## 2018-08-04 NOTE — Patient Outreach (Signed)
[place] [facility]) Care Management  08/04/2018  [name] 09-14-1950 [id number]   Member discussed in telephonic IDT meeting with [facility] UM team and facility staff. [name] is currently at [facility] SNF receiving iv abx.  Writer following for potential [facility] Care Management needs as a benefit for [employer].  Will continue to follow for disposition plans and progression.  Will continue to collaborate with [facility] UM team and facility staff about this member.    Marthenia Rolling, MSN-Ed, RN,BSN [place] Acute Care Coordinator [phone]

## 2018-08-10 ENCOUNTER — Inpatient Hospital Stay: Payer: Medicare Other | Admitting: Internal Medicine

## 2018-08-15 DIAGNOSIS — M6281 Muscle weakness (generalized): Secondary | ICD-10-CM | POA: Diagnosis not present

## 2018-08-15 DIAGNOSIS — M4626 Osteomyelitis of vertebra, lumbar region: Secondary | ICD-10-CM | POA: Diagnosis not present

## 2018-08-15 DIAGNOSIS — G061 Intraspinal abscess and granuloma: Secondary | ICD-10-CM | POA: Diagnosis not present

## 2018-08-15 DIAGNOSIS — E1169 Type 2 diabetes mellitus with other specified complication: Secondary | ICD-10-CM | POA: Diagnosis not present

## 2018-08-15 DIAGNOSIS — M797 Fibromyalgia: Secondary | ICD-10-CM | POA: Diagnosis not present

## 2018-08-15 DIAGNOSIS — F419 Anxiety disorder, unspecified: Secondary | ICD-10-CM | POA: Diagnosis not present

## 2018-08-15 DIAGNOSIS — F319 Bipolar disorder, unspecified: Secondary | ICD-10-CM | POA: Diagnosis not present

## 2018-08-16 ENCOUNTER — Telehealth: Payer: Self-pay

## 2018-08-16 ENCOUNTER — Other Ambulatory Visit: Payer: Self-pay | Admitting: Internal Medicine

## 2018-08-16 DIAGNOSIS — M4646 Discitis, unspecified, lumbar region: Secondary | ICD-10-CM

## 2018-08-16 DIAGNOSIS — R112 Nausea with vomiting, unspecified: Secondary | ICD-10-CM

## 2018-08-16 DIAGNOSIS — A09 Infectious gastroenteritis and colitis, unspecified: Secondary | ICD-10-CM

## 2018-08-16 MED ORDER — ONDANSETRON HCL 4 MG PO TABS: 8.0000 mg | ORAL_TABLET | Freq: Three times a day (TID) | ORAL | 2 refills | Status: DC | PRN

## 2018-08-16 MED ORDER — DOXYCYCLINE HYCLATE 100 MG PO TABS: 100.0000 mg | ORAL_TABLET | Freq: Two times a day (BID) | ORAL | Status: DC

## 2018-08-16 NOTE — Telephone Encounter (Addendum)
Patient's husband came into clinic today to pickup stool kit. Was able to give patient's husband direction to handle kit and storage procedure. Answered all questions that patient's husband had. Also informed patient's husband that a prescription for zofran was went into pharmacy.  [place]

## 2018-08-16 NOTE — Telephone Encounter (Signed)
Patient called today to inform Dr. Megan Salon she has finished her Vancomycin.  Patient states she has not started her doxycycline yet, but would like to know if alternative medication could be called into [facility] on [address]. Patient states in the past she experienced nausea/ vomiting, and diarrhea while on doxycycline. Will route message to Dr. Megan Salon to advise on alternative antibiotic. [place]

## 2018-08-16 NOTE — Telephone Encounter (Signed)
I called and spoke to [name] today.  She completed IV vancomycin yesterday.  Her PICC was removed and she was discharged from the skilled nursing facility and returned home.  She tells me that 2 weeks ago she started having nausea, vomiting and diarrhea.  She was treated with Zofran 4 mg twice daily but has not had much relief.  She says that she was not evaluated for diarrhea.  She assumed that all of this was secondary to IV vancomycin.  She has not started oral doxycycline yet.  Her husband, Jeanell Sparrow, will come in and get a stool specimen cup.  And will bring back a specimen for C. difficile testing.  We will also increase her Zofran.  I will have her hold off on starting doxycycline until we see how she does over the next few days and get the results of C. difficile testing back.

## 2018-08-17 ENCOUNTER — Other Ambulatory Visit: Payer: Self-pay | Admitting: *Deleted

## 2018-08-17 DIAGNOSIS — I10 Essential (primary) hypertension: Secondary | ICD-10-CM

## 2018-08-17 NOTE — Patient Outreach (Signed)
Writer attended telephonic IDT meeting with [facility] SNF staff, [facility] UM RN along with [facility] MD Director.   Facility confirms that [name] discharged home with home health on 08/15/18. [facility] UM MD Director requests [place] Management follow up.  Member discharged from SNF prior to writer's outreach. [name] has multiple co-morbidities to include DM, Graves disease, HTN, spinal abscess, with recent hospitalization for epidural abscess of lumbar spine.  Will make referral to [facility] for complex case management.   Tiffany Rolling, MSN-Ed, RN,BSN [place] Acute Care Coordinator [phone]

## 2018-08-18 ENCOUNTER — Telehealth: Payer: Self-pay | Admitting: *Deleted

## 2018-08-18 DIAGNOSIS — E1122 Type 2 diabetes mellitus with diabetic chronic kidney disease: Secondary | ICD-10-CM | POA: Diagnosis not present

## 2018-08-18 DIAGNOSIS — N182 Chronic kidney disease, stage 2 (mild): Secondary | ICD-10-CM | POA: Diagnosis not present

## 2018-08-18 DIAGNOSIS — Z794 Long term (current) use of insulin: Secondary | ICD-10-CM | POA: Diagnosis not present

## 2018-08-18 DIAGNOSIS — I129 Hypertensive chronic kidney disease with stage 1 through stage 4 chronic kidney disease, or unspecified chronic kidney disease: Secondary | ICD-10-CM | POA: Diagnosis not present

## 2018-08-18 DIAGNOSIS — E039 Hypothyroidism, unspecified: Secondary | ICD-10-CM | POA: Diagnosis not present

## 2018-08-18 DIAGNOSIS — F329 Major depressive disorder, single episode, unspecified: Secondary | ICD-10-CM | POA: Diagnosis not present

## 2018-08-18 DIAGNOSIS — E785 Hyperlipidemia, unspecified: Secondary | ICD-10-CM | POA: Diagnosis not present

## 2018-08-18 DIAGNOSIS — E0500 Thyrotoxicosis with diffuse goiter without thyrotoxic crisis or storm: Secondary | ICD-10-CM | POA: Diagnosis not present

## 2018-08-18 DIAGNOSIS — D631 Anemia in chronic kidney disease: Secondary | ICD-10-CM | POA: Diagnosis not present

## 2018-08-18 DIAGNOSIS — M4646 Discitis, unspecified, lumbar region: Secondary | ICD-10-CM

## 2018-08-18 DIAGNOSIS — G894 Chronic pain syndrome: Secondary | ICD-10-CM | POA: Diagnosis not present

## 2018-08-18 DIAGNOSIS — M797 Fibromyalgia: Secondary | ICD-10-CM | POA: Diagnosis not present

## 2018-08-18 NOTE — Telephone Encounter (Signed)
Patient called to let Dr Megan Salon know that her diarrhea has stopped (she was not able to get a stool sample to test for c diff).  Her vomiting has stopped, and her nausea is better.  She has not needed to take a zofran today.  She would like to know if there is an alternative to the doxycycline? SHe has not started oral antibiotics yet, but her PICC is out. Please advise. Landis Gandy, RN

## 2018-08-21 ENCOUNTER — Other Ambulatory Visit: Payer: Self-pay | Admitting: *Deleted

## 2018-08-21 ENCOUNTER — Encounter: Payer: Self-pay | Admitting: *Deleted

## 2018-08-21 DIAGNOSIS — D631 Anemia in chronic kidney disease: Secondary | ICD-10-CM | POA: Diagnosis not present

## 2018-08-21 DIAGNOSIS — N182 Chronic kidney disease, stage 2 (mild): Secondary | ICD-10-CM | POA: Diagnosis not present

## 2018-08-21 DIAGNOSIS — I129 Hypertensive chronic kidney disease with stage 1 through stage 4 chronic kidney disease, or unspecified chronic kidney disease: Secondary | ICD-10-CM | POA: Diagnosis not present

## 2018-08-21 DIAGNOSIS — E1122 Type 2 diabetes mellitus with diabetic chronic kidney disease: Secondary | ICD-10-CM | POA: Diagnosis not present

## 2018-08-21 DIAGNOSIS — G894 Chronic pain syndrome: Secondary | ICD-10-CM | POA: Diagnosis not present

## 2018-08-21 DIAGNOSIS — E0500 Thyrotoxicosis with diffuse goiter without thyrotoxic crisis or storm: Secondary | ICD-10-CM | POA: Diagnosis not present

## 2018-08-21 MED ORDER — DOXYCYCLINE HYCLATE 100 MG PO TABS: 100.0000 mg | ORAL_TABLET | Freq: Two times a day (BID) | ORAL | 0 refills | Status: DC

## 2018-08-21 NOTE — Addendum Note (Signed)
Addended by: Landis Gandy on: 08/21/2018 09:48 AM   Modules accepted: Orders

## 2018-08-21 NOTE — Patient Outreach (Addendum)
[place] [facility]) Care Management  08/21/2018  TRITIA ENDO Jul 16, 1950 [id number]    Referral received 08/17/2018 Initial Outreach 08/21/2018  RN spoke with pt today and verified identifiers. RN explained the purpose for today's call and further inquired on her recent discharge for the SNF. Pt verified [facility] involvement with a recent home visit and she will have PT/OT for ongoing services. Pt reports pending virtual telephone visit Wednesday with her surgeon however pending a call to her primary provider via post-op SNF follow up.  All medication were reviewed and discussed with pt's recent discharge from the SNF.  RN stress the importance of contact her provider for ongoing acknowledgement of her recent SNF discharge. Further discussion on pt's medical issues related the following issues:  Diabetes: Most recent A1c 5.7 (insulin) HTN: Last reading 117/70 [facility])  Discussed any other issues pt would like for this RN case manager to address for program participation. RN discussed her recent surgery as pt complains of ongoing weakness with no falls or injuries. RN inquired on DME used as pt indicates she is using a walker with PT and ambulating well. Pt has a very good support system (spouse) and sufficient transport services to all medical appointments. RN further discussed [facility] services for social work and pharmacy services if needed. Pt declined at this time. Discussed and generated a plan of care related to weakness and being safe with use of assisted devices. Pt receptive to monthly follow up call but does not wish to have the transition of care call weekly over the next month. RN will pursue pt's requested with a plan of care. Goals and interventions discussed has pt will adhere to all discussed related to her medical issues as noted on the care plan. Pt has a spinal abscess with continuous treatment. Pt has been prescribed different regimens of antibiotics and will be starting new  medication for doxycycline tomorrow. Reports much improvement after different types of antibiotics however infection disease is following pt for her abscess. The following was generated and will continue to be re-evaluate with a follow up call in a few weeks. Will send Welcome and Successful outreach letter and notify primary of pt's disposition with [facility] services. Pt has contact name and a contact number for [facility] if there are any questions or inquires.   Patient was recently discharged from hospital and all medications have been reviewed.  THN CM Care Plan Problem One     Most Recent Value  Care Plan Problem One  Weakness related to recent abscess and surgery  Role Documenting the Problem One  Care Management [place] for Problem One  Active  THN Long Term Goal   [facility] for PT/OT to build her strength and endurance in the next 90 days.  THN Long Term Goal Start Date  08/21/18  Interventions for Problem One Long Term Goal  Pt will work with [facility] services to improve her weakness.   THN CM Short Term Goal #1   Pt will take her prescribed antiboitics over the next 30 days  THN CM Short Term Goal #1 Start Date  08/21/18  Interventions for Short Term Goal #1  Will strongly encourage pt to take the prescribed medications to improve her abscess to prevent further infections  THN CM Short Term Goal #2   Adherence to scheduled appointments post SNF d/c over the next 30 days.  THN CM Short Term Goal #2 Start Date  08/21/18  Interventions for Short Term Goal #2  Will strongly encouraged adherence with all medical appointment to prevent acute issues from occurring due to her recent SNF.       Raina Mina, RN Care Management Coordinator [place] Office [phone]

## 2018-08-21 NOTE — Telephone Encounter (Signed)
Please let her know that, unfortunately, there is no good alternative to doxycycline.  Asked her to try taking the doxycycline.  She can use Zofran 30 minutes before taking it if she has problems with nausea.  Ask her to let us know how she is doing in the next 48 hours.  Thanks.

## 2018-08-21 NOTE — Telephone Encounter (Addendum)
Relayed to patient, she will try and let us know. Sent doxy electronically to her pharmacy. She will start it as planned. Landis Gandy, RN

## 2018-08-22 DIAGNOSIS — I129 Hypertensive chronic kidney disease with stage 1 through stage 4 chronic kidney disease, or unspecified chronic kidney disease: Secondary | ICD-10-CM | POA: Diagnosis not present

## 2018-08-22 DIAGNOSIS — D631 Anemia in chronic kidney disease: Secondary | ICD-10-CM | POA: Diagnosis not present

## 2018-08-22 DIAGNOSIS — N182 Chronic kidney disease, stage 2 (mild): Secondary | ICD-10-CM | POA: Diagnosis not present

## 2018-08-22 DIAGNOSIS — E1122 Type 2 diabetes mellitus with diabetic chronic kidney disease: Secondary | ICD-10-CM | POA: Diagnosis not present

## 2018-08-22 DIAGNOSIS — G894 Chronic pain syndrome: Secondary | ICD-10-CM | POA: Diagnosis not present

## 2018-08-22 DIAGNOSIS — E0500 Thyrotoxicosis with diffuse goiter without thyrotoxic crisis or storm: Secondary | ICD-10-CM | POA: Diagnosis not present

## 2018-08-23 DIAGNOSIS — M4620 Osteomyelitis of vertebra, site unspecified: Secondary | ICD-10-CM | POA: Diagnosis not present

## 2018-08-23 DIAGNOSIS — I10 Essential (primary) hypertension: Secondary | ICD-10-CM | POA: Diagnosis not present

## 2018-08-25 DIAGNOSIS — E1122 Type 2 diabetes mellitus with diabetic chronic kidney disease: Secondary | ICD-10-CM | POA: Diagnosis not present

## 2018-08-25 DIAGNOSIS — I129 Hypertensive chronic kidney disease with stage 1 through stage 4 chronic kidney disease, or unspecified chronic kidney disease: Secondary | ICD-10-CM | POA: Diagnosis not present

## 2018-08-25 DIAGNOSIS — E0500 Thyrotoxicosis with diffuse goiter without thyrotoxic crisis or storm: Secondary | ICD-10-CM | POA: Diagnosis not present

## 2018-08-25 DIAGNOSIS — G894 Chronic pain syndrome: Secondary | ICD-10-CM | POA: Diagnosis not present

## 2018-08-25 DIAGNOSIS — N182 Chronic kidney disease, stage 2 (mild): Secondary | ICD-10-CM | POA: Diagnosis not present

## 2018-08-25 DIAGNOSIS — D631 Anemia in chronic kidney disease: Secondary | ICD-10-CM | POA: Diagnosis not present

## 2018-08-28 DIAGNOSIS — I129 Hypertensive chronic kidney disease with stage 1 through stage 4 chronic kidney disease, or unspecified chronic kidney disease: Secondary | ICD-10-CM | POA: Diagnosis not present

## 2018-08-28 DIAGNOSIS — E0500 Thyrotoxicosis with diffuse goiter without thyrotoxic crisis or storm: Secondary | ICD-10-CM | POA: Diagnosis not present

## 2018-08-28 DIAGNOSIS — N182 Chronic kidney disease, stage 2 (mild): Secondary | ICD-10-CM | POA: Diagnosis not present

## 2018-08-28 DIAGNOSIS — E1122 Type 2 diabetes mellitus with diabetic chronic kidney disease: Secondary | ICD-10-CM | POA: Diagnosis not present

## 2018-08-28 DIAGNOSIS — G894 Chronic pain syndrome: Secondary | ICD-10-CM | POA: Diagnosis not present

## 2018-08-28 DIAGNOSIS — D631 Anemia in chronic kidney disease: Secondary | ICD-10-CM | POA: Diagnosis not present

## 2018-08-29 DIAGNOSIS — E1122 Type 2 diabetes mellitus with diabetic chronic kidney disease: Secondary | ICD-10-CM | POA: Diagnosis not present

## 2018-08-29 DIAGNOSIS — G894 Chronic pain syndrome: Secondary | ICD-10-CM | POA: Diagnosis not present

## 2018-08-29 DIAGNOSIS — D631 Anemia in chronic kidney disease: Secondary | ICD-10-CM | POA: Diagnosis not present

## 2018-08-29 DIAGNOSIS — N182 Chronic kidney disease, stage 2 (mild): Secondary | ICD-10-CM | POA: Diagnosis not present

## 2018-08-29 DIAGNOSIS — I129 Hypertensive chronic kidney disease with stage 1 through stage 4 chronic kidney disease, or unspecified chronic kidney disease: Secondary | ICD-10-CM | POA: Diagnosis not present

## 2018-08-29 DIAGNOSIS — E0500 Thyrotoxicosis with diffuse goiter without thyrotoxic crisis or storm: Secondary | ICD-10-CM | POA: Diagnosis not present

## 2018-08-30 DIAGNOSIS — D631 Anemia in chronic kidney disease: Secondary | ICD-10-CM | POA: Diagnosis not present

## 2018-08-30 DIAGNOSIS — G894 Chronic pain syndrome: Secondary | ICD-10-CM | POA: Diagnosis not present

## 2018-08-30 DIAGNOSIS — N182 Chronic kidney disease, stage 2 (mild): Secondary | ICD-10-CM | POA: Diagnosis not present

## 2018-08-30 DIAGNOSIS — E0500 Thyrotoxicosis with diffuse goiter without thyrotoxic crisis or storm: Secondary | ICD-10-CM | POA: Diagnosis not present

## 2018-08-30 DIAGNOSIS — I129 Hypertensive chronic kidney disease with stage 1 through stage 4 chronic kidney disease, or unspecified chronic kidney disease: Secondary | ICD-10-CM | POA: Diagnosis not present

## 2018-08-30 DIAGNOSIS — E1122 Type 2 diabetes mellitus with diabetic chronic kidney disease: Secondary | ICD-10-CM | POA: Diagnosis not present

## 2018-09-01 ENCOUNTER — Telehealth: Payer: Self-pay | Admitting: Internal Medicine

## 2018-09-01 NOTE — Telephone Encounter (Signed)
COVID-19 Pre-Screening Questions: ° °Do you currently have a fever (>100 °F), chills or unexplained body aches? No  ° °Are you currently experiencing new cough, shortness of breath, sore throat, runny nose? No  °•  °Have you recently travelled outside the state of [place] in the last 14 days? No  °•  °1. Have you been in contact with someone that is currently pending confirmation of Covid19 testing or has been confirmed to have the Covid19 virus?  No  ° °

## 2018-09-04 ENCOUNTER — Ambulatory Visit: Payer: Medicare Other | Admitting: Internal Medicine

## 2018-09-04 DIAGNOSIS — E0500 Thyrotoxicosis with diffuse goiter without thyrotoxic crisis or storm: Secondary | ICD-10-CM | POA: Diagnosis not present

## 2018-09-04 DIAGNOSIS — D631 Anemia in chronic kidney disease: Secondary | ICD-10-CM | POA: Diagnosis not present

## 2018-09-04 DIAGNOSIS — N182 Chronic kidney disease, stage 2 (mild): Secondary | ICD-10-CM | POA: Diagnosis not present

## 2018-09-04 DIAGNOSIS — I129 Hypertensive chronic kidney disease with stage 1 through stage 4 chronic kidney disease, or unspecified chronic kidney disease: Secondary | ICD-10-CM | POA: Diagnosis not present

## 2018-09-04 DIAGNOSIS — G894 Chronic pain syndrome: Secondary | ICD-10-CM | POA: Diagnosis not present

## 2018-09-04 DIAGNOSIS — E1122 Type 2 diabetes mellitus with diabetic chronic kidney disease: Secondary | ICD-10-CM | POA: Diagnosis not present

## 2018-09-05 DIAGNOSIS — N182 Chronic kidney disease, stage 2 (mild): Secondary | ICD-10-CM | POA: Diagnosis not present

## 2018-09-05 DIAGNOSIS — G894 Chronic pain syndrome: Secondary | ICD-10-CM | POA: Diagnosis not present

## 2018-09-05 DIAGNOSIS — I129 Hypertensive chronic kidney disease with stage 1 through stage 4 chronic kidney disease, or unspecified chronic kidney disease: Secondary | ICD-10-CM | POA: Diagnosis not present

## 2018-09-05 DIAGNOSIS — E0500 Thyrotoxicosis with diffuse goiter without thyrotoxic crisis or storm: Secondary | ICD-10-CM | POA: Diagnosis not present

## 2018-09-05 DIAGNOSIS — E1122 Type 2 diabetes mellitus with diabetic chronic kidney disease: Secondary | ICD-10-CM | POA: Diagnosis not present

## 2018-09-05 DIAGNOSIS — D631 Anemia in chronic kidney disease: Secondary | ICD-10-CM | POA: Diagnosis not present

## 2018-09-06 DIAGNOSIS — G894 Chronic pain syndrome: Secondary | ICD-10-CM | POA: Diagnosis not present

## 2018-09-06 DIAGNOSIS — E0500 Thyrotoxicosis with diffuse goiter without thyrotoxic crisis or storm: Secondary | ICD-10-CM | POA: Diagnosis not present

## 2018-09-06 DIAGNOSIS — E1122 Type 2 diabetes mellitus with diabetic chronic kidney disease: Secondary | ICD-10-CM | POA: Diagnosis not present

## 2018-09-06 DIAGNOSIS — N182 Chronic kidney disease, stage 2 (mild): Secondary | ICD-10-CM | POA: Diagnosis not present

## 2018-09-06 DIAGNOSIS — I129 Hypertensive chronic kidney disease with stage 1 through stage 4 chronic kidney disease, or unspecified chronic kidney disease: Secondary | ICD-10-CM | POA: Diagnosis not present

## 2018-09-06 DIAGNOSIS — D631 Anemia in chronic kidney disease: Secondary | ICD-10-CM | POA: Diagnosis not present

## 2018-09-11 DIAGNOSIS — E0500 Thyrotoxicosis with diffuse goiter without thyrotoxic crisis or storm: Secondary | ICD-10-CM | POA: Diagnosis not present

## 2018-09-11 DIAGNOSIS — K869 Disease of pancreas, unspecified: Secondary | ICD-10-CM | POA: Diagnosis not present

## 2018-09-11 DIAGNOSIS — G894 Chronic pain syndrome: Secondary | ICD-10-CM | POA: Diagnosis not present

## 2018-09-11 DIAGNOSIS — N182 Chronic kidney disease, stage 2 (mild): Secondary | ICD-10-CM | POA: Diagnosis not present

## 2018-09-11 DIAGNOSIS — D631 Anemia in chronic kidney disease: Secondary | ICD-10-CM | POA: Diagnosis not present

## 2018-09-11 DIAGNOSIS — E0590 Thyrotoxicosis, unspecified without thyrotoxic crisis or storm: Secondary | ICD-10-CM | POA: Diagnosis not present

## 2018-09-11 DIAGNOSIS — E1129 Type 2 diabetes mellitus with other diabetic kidney complication: Secondary | ICD-10-CM | POA: Diagnosis not present

## 2018-09-11 DIAGNOSIS — M4646 Discitis, unspecified, lumbar region: Secondary | ICD-10-CM | POA: Diagnosis not present

## 2018-09-11 DIAGNOSIS — E785 Hyperlipidemia, unspecified: Secondary | ICD-10-CM | POA: Diagnosis not present

## 2018-09-11 DIAGNOSIS — D649 Anemia, unspecified: Secondary | ICD-10-CM | POA: Diagnosis not present

## 2018-09-11 DIAGNOSIS — E1122 Type 2 diabetes mellitus with diabetic chronic kidney disease: Secondary | ICD-10-CM | POA: Diagnosis not present

## 2018-09-11 DIAGNOSIS — I129 Hypertensive chronic kidney disease with stage 1 through stage 4 chronic kidney disease, or unspecified chronic kidney disease: Secondary | ICD-10-CM | POA: Diagnosis not present

## 2018-09-12 DIAGNOSIS — E0500 Thyrotoxicosis with diffuse goiter without thyrotoxic crisis or storm: Secondary | ICD-10-CM | POA: Diagnosis not present

## 2018-09-12 DIAGNOSIS — D631 Anemia in chronic kidney disease: Secondary | ICD-10-CM | POA: Diagnosis not present

## 2018-09-12 DIAGNOSIS — E1122 Type 2 diabetes mellitus with diabetic chronic kidney disease: Secondary | ICD-10-CM | POA: Diagnosis not present

## 2018-09-12 DIAGNOSIS — I129 Hypertensive chronic kidney disease with stage 1 through stage 4 chronic kidney disease, or unspecified chronic kidney disease: Secondary | ICD-10-CM | POA: Diagnosis not present

## 2018-09-12 DIAGNOSIS — G894 Chronic pain syndrome: Secondary | ICD-10-CM | POA: Diagnosis not present

## 2018-09-12 DIAGNOSIS — N182 Chronic kidney disease, stage 2 (mild): Secondary | ICD-10-CM | POA: Diagnosis not present

## 2018-09-13 DIAGNOSIS — K869 Disease of pancreas, unspecified: Secondary | ICD-10-CM | POA: Diagnosis not present

## 2018-09-13 DIAGNOSIS — I129 Hypertensive chronic kidney disease with stage 1 through stage 4 chronic kidney disease, or unspecified chronic kidney disease: Secondary | ICD-10-CM | POA: Diagnosis not present

## 2018-09-13 DIAGNOSIS — E7849 Other hyperlipidemia: Secondary | ICD-10-CM | POA: Diagnosis not present

## 2018-09-13 DIAGNOSIS — E038 Other specified hypothyroidism: Secondary | ICD-10-CM | POA: Diagnosis not present

## 2018-09-14 ENCOUNTER — Other Ambulatory Visit: Payer: Self-pay | Admitting: *Deleted

## 2018-09-14 NOTE — Patient Outreach (Signed)
[place] [facility]) Care Management  09/14/2018  SHANTIA SANFORD June 23, 1950 [id number]  Transition of care   RN attempted to call pt today and was able to verify identifiers however pt indicated she was on her way out of town and requested a call back early next week. Will follow up accordingly next week and attempt to obtain an update on pt's management of care. I was not able to obtain this information on this call.   Raina Mina, RN Care Management Coordinator [place] Office [phone]

## 2018-09-17 DIAGNOSIS — F329 Major depressive disorder, single episode, unspecified: Secondary | ICD-10-CM | POA: Diagnosis not present

## 2018-09-17 DIAGNOSIS — Z794 Long term (current) use of insulin: Secondary | ICD-10-CM | POA: Diagnosis not present

## 2018-09-17 DIAGNOSIS — G894 Chronic pain syndrome: Secondary | ICD-10-CM | POA: Diagnosis not present

## 2018-09-17 DIAGNOSIS — D631 Anemia in chronic kidney disease: Secondary | ICD-10-CM | POA: Diagnosis not present

## 2018-09-17 DIAGNOSIS — M797 Fibromyalgia: Secondary | ICD-10-CM | POA: Diagnosis not present

## 2018-09-17 DIAGNOSIS — E785 Hyperlipidemia, unspecified: Secondary | ICD-10-CM | POA: Diagnosis not present

## 2018-09-17 DIAGNOSIS — I129 Hypertensive chronic kidney disease with stage 1 through stage 4 chronic kidney disease, or unspecified chronic kidney disease: Secondary | ICD-10-CM | POA: Diagnosis not present

## 2018-09-17 DIAGNOSIS — E0500 Thyrotoxicosis with diffuse goiter without thyrotoxic crisis or storm: Secondary | ICD-10-CM | POA: Diagnosis not present

## 2018-09-17 DIAGNOSIS — E1122 Type 2 diabetes mellitus with diabetic chronic kidney disease: Secondary | ICD-10-CM | POA: Diagnosis not present

## 2018-09-17 DIAGNOSIS — E039 Hypothyroidism, unspecified: Secondary | ICD-10-CM | POA: Diagnosis not present

## 2018-09-17 DIAGNOSIS — N182 Chronic kidney disease, stage 2 (mild): Secondary | ICD-10-CM | POA: Diagnosis not present

## 2018-09-18 DIAGNOSIS — N182 Chronic kidney disease, stage 2 (mild): Secondary | ICD-10-CM | POA: Diagnosis not present

## 2018-09-18 DIAGNOSIS — I129 Hypertensive chronic kidney disease with stage 1 through stage 4 chronic kidney disease, or unspecified chronic kidney disease: Secondary | ICD-10-CM | POA: Diagnosis not present

## 2018-09-18 DIAGNOSIS — G894 Chronic pain syndrome: Secondary | ICD-10-CM | POA: Diagnosis not present

## 2018-09-18 DIAGNOSIS — E0500 Thyrotoxicosis with diffuse goiter without thyrotoxic crisis or storm: Secondary | ICD-10-CM | POA: Diagnosis not present

## 2018-09-18 DIAGNOSIS — D631 Anemia in chronic kidney disease: Secondary | ICD-10-CM | POA: Diagnosis not present

## 2018-09-18 DIAGNOSIS — E1122 Type 2 diabetes mellitus with diabetic chronic kidney disease: Secondary | ICD-10-CM | POA: Diagnosis not present

## 2018-09-19 ENCOUNTER — Telehealth: Payer: Self-pay | Admitting: Pharmacy Technician

## 2018-09-19 ENCOUNTER — Other Ambulatory Visit: Payer: Self-pay

## 2018-09-19 ENCOUNTER — Telehealth: Payer: Self-pay | Admitting: *Deleted

## 2018-09-19 DIAGNOSIS — E0500 Thyrotoxicosis with diffuse goiter without thyrotoxic crisis or storm: Secondary | ICD-10-CM | POA: Diagnosis not present

## 2018-09-19 DIAGNOSIS — E1122 Type 2 diabetes mellitus with diabetic chronic kidney disease: Secondary | ICD-10-CM | POA: Diagnosis not present

## 2018-09-19 DIAGNOSIS — M4646 Discitis, unspecified, lumbar region: Secondary | ICD-10-CM

## 2018-09-19 DIAGNOSIS — G894 Chronic pain syndrome: Secondary | ICD-10-CM | POA: Diagnosis not present

## 2018-09-19 DIAGNOSIS — D631 Anemia in chronic kidney disease: Secondary | ICD-10-CM | POA: Diagnosis not present

## 2018-09-19 DIAGNOSIS — N182 Chronic kidney disease, stage 2 (mild): Secondary | ICD-10-CM | POA: Diagnosis not present

## 2018-09-19 DIAGNOSIS — I129 Hypertensive chronic kidney disease with stage 1 through stage 4 chronic kidney disease, or unspecified chronic kidney disease: Secondary | ICD-10-CM | POA: Diagnosis not present

## 2018-09-19 MED ORDER — DOXYCYCLINE HYCLATE 100 MG PO TABS: 100.0000 mg | ORAL_TABLET | Freq: Two times a day (BID) | ORAL | 0 refills | Status: DC

## 2018-09-19 NOTE — Telephone Encounter (Signed)
Patient left message asking if she was supposed to have 6 weeks of oral antibiotics - she states she only had 30 days worth.  She wil lcomplete doxycycline this week.  She did not keep her follow up appointment 6/15, RN rescheduled for 7/22 at 3:45. Please advise if she needs to continue doxycycline until she is seen. Landis Gandy, RN

## 2018-09-19 NOTE — Patient Outreach (Signed)
[place] [facility]) Care Management  09/19/2018  [name] 1950-08-31 [id number]   Unsuccessful Outreach x 2.  Called member at preferred number and after introducing self, member verified her name only and stated "my Physical Therapist is here right now". Asked member is there better time that RN CM could call when member will be available and member stated " I don't know that I really need that" and call was disconnected.   Will send Unsuccessful Outreach Letter. Will update member's RN CM to call next week.  Benjamine Mola "ANN" Josiah Lobo, RN-BSN  [facility] Care Management  Community Care Management Coordinator  [phone] [email or web address]

## 2018-09-19 NOTE — Telephone Encounter (Signed)
Notified patient, sent refill. Thanks!

## 2018-09-19 NOTE — Telephone Encounter (Signed)
[facility] Patient Advocate Encounter  Patient called this afternoon to see if she needed a refill of doxycycline, has currently finished the script from 08/21/2018. She was under the impression Dr. Megan Salon wanted her to continue taking the medication past 30-days. She left the number [phone]

## 2018-09-19 NOTE — Addendum Note (Signed)
Addended by: Landis Gandy on: 09/19/2018 03:55 PM   Modules accepted: Orders

## 2018-09-19 NOTE — Telephone Encounter (Signed)
I would like for her to stay on doxycycline until she follows up here.

## 2018-09-25 DIAGNOSIS — N182 Chronic kidney disease, stage 2 (mild): Secondary | ICD-10-CM | POA: Diagnosis not present

## 2018-09-25 DIAGNOSIS — D631 Anemia in chronic kidney disease: Secondary | ICD-10-CM | POA: Diagnosis not present

## 2018-09-25 DIAGNOSIS — I129 Hypertensive chronic kidney disease with stage 1 through stage 4 chronic kidney disease, or unspecified chronic kidney disease: Secondary | ICD-10-CM | POA: Diagnosis not present

## 2018-09-25 DIAGNOSIS — E0500 Thyrotoxicosis with diffuse goiter without thyrotoxic crisis or storm: Secondary | ICD-10-CM | POA: Diagnosis not present

## 2018-09-25 DIAGNOSIS — E1122 Type 2 diabetes mellitus with diabetic chronic kidney disease: Secondary | ICD-10-CM | POA: Diagnosis not present

## 2018-09-25 DIAGNOSIS — G894 Chronic pain syndrome: Secondary | ICD-10-CM | POA: Diagnosis not present

## 2018-09-26 DIAGNOSIS — I129 Hypertensive chronic kidney disease with stage 1 through stage 4 chronic kidney disease, or unspecified chronic kidney disease: Secondary | ICD-10-CM | POA: Diagnosis not present

## 2018-09-26 DIAGNOSIS — G894 Chronic pain syndrome: Secondary | ICD-10-CM | POA: Diagnosis not present

## 2018-09-26 DIAGNOSIS — E0500 Thyrotoxicosis with diffuse goiter without thyrotoxic crisis or storm: Secondary | ICD-10-CM | POA: Diagnosis not present

## 2018-09-26 DIAGNOSIS — N182 Chronic kidney disease, stage 2 (mild): Secondary | ICD-10-CM | POA: Diagnosis not present

## 2018-09-26 DIAGNOSIS — D631 Anemia in chronic kidney disease: Secondary | ICD-10-CM | POA: Diagnosis not present

## 2018-09-26 DIAGNOSIS — E1122 Type 2 diabetes mellitus with diabetic chronic kidney disease: Secondary | ICD-10-CM | POA: Diagnosis not present

## 2018-09-28 ENCOUNTER — Other Ambulatory Visit: Payer: Self-pay | Admitting: *Deleted

## 2018-09-28 NOTE — Patient Outreach (Signed)
[facility]) Care Management  09/28/2018  [name] 1950/12/29 [id number]  Telephone Assessment - Weakness post op SNF d/c  RN spoke with pt today and received an update on pt's ongoing management of care. Pt reports ongoing involvement with [facility] with PT/OT. Pt verifies she continues to walk day to continue building strengthening exercises. Plan of care discussed as pt continues to verify adherence with medical appointments and pt continues with her antibiotics for the ongoing spinal abscess as prescribed. No other issues or added medications. Will discussed new interventions and adjust accordingly based upon pt's progress. Will strongly encourage only management of care and offer any tools or resources needed to continue adherence. Pt continues to agree with monthly follow up calls at this time. Will follow up in a few weeks with ongoing plan of care.  THN CM Care Plan Problem One     Most Recent Value  Care Plan Problem One  Weakness related to recent abscess and surgery  Role Documenting the Problem One  Care Management [place] for Problem One  Active  THN Long Term Goal   [facility] for PT/OT to build her strength and endurance in the next 90 days.  THN Long Term Goal Start Date  08/21/18  Interventions for Problem One Long Term Goal  Verified pt cont's to received [facility] PT/OT weekly. Will encouraged pt to participate to increase his ongoing endurance and strenghtening exercises. Will extend to allow pt continue managing her care. Will verify pt continue to perform exercises to assist with her ongoing strength..    THN CM Short Term Goal #1   Pt will take her prescribed antiboitics over the next 30 days  THN CM Short Term Goal #1 Start Date  08/21/18  Interventions for Short Term Goal #1  Will verify pt's continue to administer her antibiotic regimen. Will continue to encourage medication adherence for the ongoing abcess. Will follow up accordingly   THN  CM Short Term Goal #2   Adherence to scheduled appointments post SNF d/c over the next 30 days.  THN CM Short Term Goal #2 Start Date  08/21/18  Interventions for Short Term Goal #2  Will extend to allow ongoing adherence with attending all medical appointments. Will continue to encouraged and offer secondary source of transportation if needed for pending appointments.       Raina Mina, RN Care Management Coordinator [place] Office [phone]

## 2018-10-03 DIAGNOSIS — E1122 Type 2 diabetes mellitus with diabetic chronic kidney disease: Secondary | ICD-10-CM | POA: Diagnosis not present

## 2018-10-03 DIAGNOSIS — I129 Hypertensive chronic kidney disease with stage 1 through stage 4 chronic kidney disease, or unspecified chronic kidney disease: Secondary | ICD-10-CM | POA: Diagnosis not present

## 2018-10-03 DIAGNOSIS — D631 Anemia in chronic kidney disease: Secondary | ICD-10-CM | POA: Diagnosis not present

## 2018-10-03 DIAGNOSIS — G894 Chronic pain syndrome: Secondary | ICD-10-CM | POA: Diagnosis not present

## 2018-10-03 DIAGNOSIS — E0500 Thyrotoxicosis with diffuse goiter without thyrotoxic crisis or storm: Secondary | ICD-10-CM | POA: Diagnosis not present

## 2018-10-03 DIAGNOSIS — N182 Chronic kidney disease, stage 2 (mild): Secondary | ICD-10-CM | POA: Diagnosis not present

## 2018-10-10 ENCOUNTER — Inpatient Hospital Stay: Payer: Self-pay | Admitting: Internal Medicine

## 2018-10-11 ENCOUNTER — Ambulatory Visit (INDEPENDENT_AMBULATORY_CARE_PROVIDER_SITE_OTHER): Payer: Medicare Other | Admitting: Internal Medicine

## 2018-10-11 ENCOUNTER — Encounter: Payer: Self-pay | Admitting: Internal Medicine

## 2018-10-11 ENCOUNTER — Other Ambulatory Visit: Payer: Self-pay

## 2018-10-11 DIAGNOSIS — M4646 Discitis, unspecified, lumbar region: Secondary | ICD-10-CM
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
## 2018-10-11 NOTE — Assessment & Plan Note (Signed)
She is doing much better and has now completed a little over 4 months of total antibiotic therapy.  A repeat inflammatory markers today.  I told her that there is a good chance her staph infection has been cured but that the only true test of cure is to eventually stop all antibiotics and wait to see what happens.  I will call her with lab results tomorrow and we will decide whether or not she is ready to stop doxycycline.  She will follow-up here in 6 weeks.

## 2018-10-12 LAB — BASIC METABOLIC PANEL
BUN: 24 mg/dL (ref 7–25)
CO2: 28 mmol/L (ref 20–32)
Calcium: 9.2 mg/dL (ref 8.6–10.4)
Chloride: 105 mmol/L (ref 98–110)
Creat: 0.85 mg/dL (ref 0.50–0.99)
Glucose, Bld: 125 mg/dL — ABNORMAL HIGH (ref 65–99)
Potassium: 4.4 mmol/L (ref 3.5–5.3)
Sodium: 140 mmol/L (ref 135–146)

## 2018-10-12 LAB — CBC
HCT: 30.1 % — ABNORMAL LOW (ref 35.0–45.0)
Hemoglobin: 9.6 g/dL — ABNORMAL LOW (ref 11.7–15.5)
MCH: 25.3 pg — ABNORMAL LOW (ref 27.0–33.0)
MCHC: 31.9 g/dL — ABNORMAL LOW (ref 32.0–36.0)
MCV: 79.4 fL — ABNORMAL LOW (ref 80.0–100.0)
MPV: 9.5 fL (ref 7.5–12.5)
Platelets: 173 10*3/uL (ref 140–400)
RBC: 3.79 10*6/uL — ABNORMAL LOW (ref 3.80–5.10)
RDW: 14.7 % (ref 11.0–15.0)
WBC: 2.1 10*3/uL — ABNORMAL LOW (ref 3.8–10.8)

## 2018-10-12 LAB — SEDIMENTATION RATE: Sed Rate: 39 mm/h — ABNORMAL HIGH (ref 0–30)

## 2018-10-12 LAB — C-REACTIVE PROTEIN: CRP: 7.1 mg/L (ref <8.0)

## 2018-10-13 ENCOUNTER — Other Ambulatory Visit: Payer: Self-pay | Admitting: Internal Medicine

## 2018-10-13 DIAGNOSIS — D631 Anemia in chronic kidney disease: Secondary | ICD-10-CM | POA: Diagnosis not present

## 2018-10-13 DIAGNOSIS — G894 Chronic pain syndrome: Secondary | ICD-10-CM | POA: Diagnosis not present

## 2018-10-13 DIAGNOSIS — N182 Chronic kidney disease, stage 2 (mild): Secondary | ICD-10-CM | POA: Diagnosis not present

## 2018-10-13 DIAGNOSIS — E1122 Type 2 diabetes mellitus with diabetic chronic kidney disease: Secondary | ICD-10-CM | POA: Diagnosis not present

## 2018-10-13 DIAGNOSIS — I129 Hypertensive chronic kidney disease with stage 1 through stage 4 chronic kidney disease, or unspecified chronic kidney disease: Secondary | ICD-10-CM | POA: Diagnosis not present

## 2018-10-13 DIAGNOSIS — E0500 Thyrotoxicosis with diffuse goiter without thyrotoxic crisis or storm: Secondary | ICD-10-CM | POA: Diagnosis not present

## 2018-10-13 DIAGNOSIS — M4646 Discitis, unspecified, lumbar region: Secondary | ICD-10-CM

## 2018-10-13 MED ORDER — DOXYCYCLINE HYCLATE 100 MG PO TABS: 100.0000 mg | ORAL_TABLET | Freq: Two times a day (BID) | ORAL | 2 refills | Status: DC

## 2018-10-13 NOTE — Progress Notes (Signed)
Sed Rate  Date Value  10/11/2018 39 mm/h (H)  06/05/2018 102 mm/hr (H)  02/27/2008 23 mm/hr (H)   CRP  Date Value  10/11/2018 7.1 mg/L  06/05/2018 15.5 mg/dL (H)  02/27/2008 0.4 mg/dL   I spoke to Ms. [name] by phone today and reviewed her recent sed rate and C-reactive protein.  Based on our discussion and the fact that the sed rate remains slightly elevated she has decided to continue on doxycycline until she follows up here in 6 weeks.

## 2018-10-16 ENCOUNTER — Telehealth: Payer: Self-pay | Admitting: Gastroenterology

## 2018-10-16 NOTE — Telephone Encounter (Signed)
She needs a follow up visit with Dr Ardis Hughs to discuss pancreatic cyst. Thanks

## 2018-10-17 DIAGNOSIS — D631 Anemia in chronic kidney disease: Secondary | ICD-10-CM | POA: Diagnosis not present

## 2018-10-17 DIAGNOSIS — N182 Chronic kidney disease, stage 2 (mild): Secondary | ICD-10-CM | POA: Diagnosis not present

## 2018-10-17 DIAGNOSIS — F329 Major depressive disorder, single episode, unspecified: Secondary | ICD-10-CM | POA: Diagnosis not present

## 2018-10-17 DIAGNOSIS — E1122 Type 2 diabetes mellitus with diabetic chronic kidney disease: Secondary | ICD-10-CM | POA: Diagnosis not present

## 2018-10-17 DIAGNOSIS — G894 Chronic pain syndrome: Secondary | ICD-10-CM | POA: Diagnosis not present

## 2018-10-17 DIAGNOSIS — I129 Hypertensive chronic kidney disease with stage 1 through stage 4 chronic kidney disease, or unspecified chronic kidney disease: Secondary | ICD-10-CM | POA: Diagnosis not present

## 2018-10-17 DIAGNOSIS — E785 Hyperlipidemia, unspecified: Secondary | ICD-10-CM | POA: Diagnosis not present

## 2018-10-17 DIAGNOSIS — Z794 Long term (current) use of insulin: Secondary | ICD-10-CM | POA: Diagnosis not present

## 2018-10-17 DIAGNOSIS — E039 Hypothyroidism, unspecified: Secondary | ICD-10-CM | POA: Diagnosis not present

## 2018-10-17 DIAGNOSIS — E0500 Thyrotoxicosis with diffuse goiter without thyrotoxic crisis or storm: Secondary | ICD-10-CM | POA: Diagnosis not present

## 2018-10-17 DIAGNOSIS — M797 Fibromyalgia: Secondary | ICD-10-CM | POA: Diagnosis not present

## 2018-10-25 DIAGNOSIS — M4620 Osteomyelitis of vertebra, site unspecified: Secondary | ICD-10-CM | POA: Diagnosis not present

## 2018-10-26 ENCOUNTER — Other Ambulatory Visit: Payer: Self-pay | Admitting: *Deleted

## 2018-10-26 NOTE — Patient Outreach (Signed)
[facility]) Care Management  10/26/2018  [name] 1950-07-20 [id number]    Telephone Assessment  RN spoke with pt today and received an update. RN inquired on the plan of care with the update information provided. Pt reports ongoing oral antibiotics due to her ongoing abscess however improving. States based upon the two labs findings was one negative and one positive. Pt continues ongoing [facility] for PT/OT services. States she feels stronger other then some left side weakness but plans are to be more aggressive with PT not using a walker on the last two days of therapy.  Pt verified attendance with all medical appointments and adherent with all medications. No additional issues arise and no additional needs at this time for any community resources. Pt has a good support system with sufficient transportation.   Will follow up next month on pt's ongoing progress and discuss possible graduating from the [facility] program.   [place] Problem One     Most Recent Value  Care Plan Problem One  Weakness related to recent abscess and surgery  Role Documenting the Problem One  Care Management [place] for Problem One  Active  THN Long Term Goal   [facility] for PT/OT to build her strength and endurance in the next 90 days.  THN Long Term Goal Start Date  08/21/18  Interventions for Problem One Long Term Goal  Will verify ongoing services with [facility]. Will continue to encouraged pt participation for PT/OT services. Will re-evaluate pt's next month on pt's progress.   THN CM Short Term Goal #1   Pt will take her prescribed antiboitics over the next 30 days  THN CM Short Term Goal #1 Start Date  08/21/18  [facility] CM Short Term Goal #1 Met Date  10/26/18  THN CM Short Term Goal #2   Adherence to scheduled appointments post SNF d/c over the next 30 days.  THN CM Short Term Goal #2 Start Date  08/21/18  [facility] CM Short Term Goal #2 Met Date  10/26/18      Raina Mina,  RN Care Management Coordinator [place] Office [phone]

## 2018-10-27 ENCOUNTER — Ambulatory Visit: Payer: Medicare Other | Admitting: *Deleted

## 2018-11-07 DIAGNOSIS — D631 Anemia in chronic kidney disease: Secondary | ICD-10-CM | POA: Diagnosis not present

## 2018-11-07 DIAGNOSIS — E0500 Thyrotoxicosis with diffuse goiter without thyrotoxic crisis or storm: Secondary | ICD-10-CM | POA: Diagnosis not present

## 2018-11-07 DIAGNOSIS — I129 Hypertensive chronic kidney disease with stage 1 through stage 4 chronic kidney disease, or unspecified chronic kidney disease: Secondary | ICD-10-CM | POA: Diagnosis not present

## 2018-11-07 DIAGNOSIS — N182 Chronic kidney disease, stage 2 (mild): Secondary | ICD-10-CM | POA: Diagnosis not present

## 2018-11-07 DIAGNOSIS — E1122 Type 2 diabetes mellitus with diabetic chronic kidney disease: Secondary | ICD-10-CM | POA: Diagnosis not present

## 2018-11-07 DIAGNOSIS — G894 Chronic pain syndrome: Secondary | ICD-10-CM | POA: Diagnosis not present

## 2018-11-16 DIAGNOSIS — M797 Fibromyalgia: Secondary | ICD-10-CM | POA: Diagnosis not present

## 2018-11-16 DIAGNOSIS — F329 Major depressive disorder, single episode, unspecified: Secondary | ICD-10-CM | POA: Diagnosis not present

## 2018-11-16 DIAGNOSIS — E0500 Thyrotoxicosis with diffuse goiter without thyrotoxic crisis or storm: Secondary | ICD-10-CM | POA: Diagnosis not present

## 2018-11-16 DIAGNOSIS — I129 Hypertensive chronic kidney disease with stage 1 through stage 4 chronic kidney disease, or unspecified chronic kidney disease: Secondary | ICD-10-CM | POA: Diagnosis not present

## 2018-11-16 DIAGNOSIS — E1122 Type 2 diabetes mellitus with diabetic chronic kidney disease: Secondary | ICD-10-CM | POA: Diagnosis not present

## 2018-11-16 DIAGNOSIS — E785 Hyperlipidemia, unspecified: Secondary | ICD-10-CM | POA: Diagnosis not present

## 2018-11-16 DIAGNOSIS — N182 Chronic kidney disease, stage 2 (mild): Secondary | ICD-10-CM | POA: Diagnosis not present

## 2018-11-16 DIAGNOSIS — E039 Hypothyroidism, unspecified: Secondary | ICD-10-CM | POA: Diagnosis not present

## 2018-11-16 DIAGNOSIS — G894 Chronic pain syndrome: Secondary | ICD-10-CM | POA: Diagnosis not present

## 2018-11-16 DIAGNOSIS — Z794 Long term (current) use of insulin: Secondary | ICD-10-CM | POA: Diagnosis not present

## 2018-11-16 DIAGNOSIS — D631 Anemia in chronic kidney disease: Secondary | ICD-10-CM | POA: Diagnosis not present

## 2018-11-20 DIAGNOSIS — D631 Anemia in chronic kidney disease: Secondary | ICD-10-CM | POA: Diagnosis not present

## 2018-11-20 DIAGNOSIS — E0500 Thyrotoxicosis with diffuse goiter without thyrotoxic crisis or storm: Secondary | ICD-10-CM | POA: Diagnosis not present

## 2018-11-20 DIAGNOSIS — I129 Hypertensive chronic kidney disease with stage 1 through stage 4 chronic kidney disease, or unspecified chronic kidney disease: Secondary | ICD-10-CM | POA: Diagnosis not present

## 2018-11-20 DIAGNOSIS — E1122 Type 2 diabetes mellitus with diabetic chronic kidney disease: Secondary | ICD-10-CM | POA: Diagnosis not present

## 2018-11-20 DIAGNOSIS — N182 Chronic kidney disease, stage 2 (mild): Secondary | ICD-10-CM | POA: Diagnosis not present

## 2018-11-20 DIAGNOSIS — G894 Chronic pain syndrome: Secondary | ICD-10-CM | POA: Diagnosis not present

## 2018-11-22 ENCOUNTER — Ambulatory Visit: Payer: Medicare Other | Admitting: Internal Medicine

## 2018-11-24 ENCOUNTER — Other Ambulatory Visit: Payer: Self-pay | Admitting: *Deleted

## 2018-11-24 NOTE — Patient Outreach (Signed)
[facility]) Care Management  11/24/2018  [name] 1950-05-01 [id number]    Telephone Assessment-Unsuccessful  RN attempted outreach call today however unsuccessful. RN able to leave a HIPAA approved voice message requesting a call back.   Will further engage with the return call or reach out with another follow up call within 4 business days.  Raina Mina, RN Care Management Coordinator [place] Office [phone]

## 2018-11-29 ENCOUNTER — Other Ambulatory Visit: Payer: Self-pay | Admitting: *Deleted

## 2018-11-29 NOTE — Patient Outreach (Signed)
[place] [facility]) Care Management  11/29/2018  [name] 08/17/1950 [id number]    Telephone Assessment  RN attempted outreach call however unsuccessful. RN able to leave a HIPPA approved voice message requesting a call back. Will update the plan of care at that time.   Will follow up in 4 business day with another outreach call for ongoing [facility] services.  Raina Mina, RN Care Management Coordinator [place] Office [phone]

## 2018-12-04 ENCOUNTER — Ambulatory Visit (INDEPENDENT_AMBULATORY_CARE_PROVIDER_SITE_OTHER): Payer: Medicare Other | Admitting: Gastroenterology

## 2018-12-04 ENCOUNTER — Encounter: Payer: Self-pay | Admitting: Gastroenterology

## 2018-12-04 VITALS — BP 124/60 | HR 75 | Temp 98.6°F | Ht 61.0 in | Wt 220.0 lb

## 2018-12-04 DIAGNOSIS — K862 Cyst of pancreas: Secondary | ICD-10-CM | POA: Diagnosis not present
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
## 2018-12-04 NOTE — Patient Instructions (Signed)
You will be due for your MRI/MRCP in March 2021. We will contact you with a date and time  Thank you for entrusting me with your care and choosing [facility].  Dr Ardis Hughs

## 2018-12-05 ENCOUNTER — Other Ambulatory Visit: Payer: Self-pay | Admitting: *Deleted

## 2018-12-05 NOTE — Patient Outreach (Signed)
[facility]) Care Management  12/05/2018  [name] 11/07/1950 [id number]    Case Closure  RN spoke with pt today and further discussed her progress with completed PT with [facility]. Pt states she is doing well with her right side however issues with her left knee pending an appointment with her orthopedic provider later this month. States she may need a knee replacement. Pt states she continues to take the prescribed antibiotics with the suggestions that this maybe long term. Pt will consult with Dr. Megan Salon at the end of this month to confirm along with the surgeon's input. No other issues mentioned at this time as pt states she is progressing.   Plan of care discussed with all goal met and discussion on graduating from the [facility] program and services. Pt has agreed with no additional needs or resources needed at this time. Case will be closed and pt's provider will be notified of pt's disposition with [facility] services.   [facility] CM Care Plan Problem One     Most Recent Value  Care Plan Problem One  Weakness related to recent abscess and surgery  Role Documenting the Problem One  Care Management Coordinator  Care Plan for Problem One  Not Active  [facility] Long Term Goal   [facility] for PT/OT to build her strength and endurance in the next 90 days.  [facility] Long Term Goal Start Date  08/21/18  [facility] Long Term Goal Met Date  12/05/18       Raina Mina, RN Care Management Coordinator [place] Office [phone]

## 2018-12-06 DIAGNOSIS — N3020 Other chronic cystitis without hematuria: Secondary | ICD-10-CM | POA: Diagnosis not present

## 2018-12-06 DIAGNOSIS — R350 Frequency of micturition: Secondary | ICD-10-CM | POA: Diagnosis not present

## 2018-12-06 DIAGNOSIS — R3915 Urgency of urination: Secondary | ICD-10-CM | POA: Diagnosis not present

## 2018-12-06 DIAGNOSIS — N200 Calculus of kidney: Secondary | ICD-10-CM | POA: Diagnosis not present

## 2018-12-13 DIAGNOSIS — M1712 Unilateral primary osteoarthritis, left knee: Secondary | ICD-10-CM | POA: Diagnosis not present

## 2018-12-13 DIAGNOSIS — M79644 Pain in right finger(s): Secondary | ICD-10-CM | POA: Diagnosis not present

## 2018-12-20 ENCOUNTER — Encounter: Payer: Self-pay | Admitting: Internal Medicine

## 2018-12-20 ENCOUNTER — Ambulatory Visit (INDEPENDENT_AMBULATORY_CARE_PROVIDER_SITE_OTHER): Payer: Medicare Other | Admitting: Internal Medicine

## 2018-12-20 ENCOUNTER — Other Ambulatory Visit: Payer: Self-pay

## 2018-12-20 DIAGNOSIS — M4646 Discitis, unspecified, lumbar region: Secondary | ICD-10-CM
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
## 2018-12-20 NOTE — Assessment & Plan Note (Signed)
She will stay on chronic, suppressive doxycycline for now.  I will check her inflammatory markers today and see her back in 3 months.

## 2018-12-21 LAB — SEDIMENTATION RATE: Sed Rate: 34 mm/h — ABNORMAL HIGH (ref 0–30)

## 2018-12-21 LAB — C-REACTIVE PROTEIN: CRP: 5.3 mg/L (ref <8.0)

## 2018-12-28 DIAGNOSIS — Z23 Encounter for immunization: Secondary | ICD-10-CM | POA: Diagnosis not present

## 2019-01-02 DIAGNOSIS — R3915 Urgency of urination: Secondary | ICD-10-CM | POA: Diagnosis not present

## 2019-01-02 DIAGNOSIS — R350 Frequency of micturition: Secondary | ICD-10-CM | POA: Diagnosis not present

## 2019-01-03 DIAGNOSIS — M1712 Unilateral primary osteoarthritis, left knee: Secondary | ICD-10-CM | POA: Diagnosis not present

## 2019-01-10 DIAGNOSIS — M1712 Unilateral primary osteoarthritis, left knee: Secondary | ICD-10-CM | POA: Diagnosis not present

## 2019-01-17 DIAGNOSIS — M1712 Unilateral primary osteoarthritis, left knee: Secondary | ICD-10-CM | POA: Diagnosis not present

## 2019-01-24 ENCOUNTER — Other Ambulatory Visit: Payer: Self-pay | Admitting: Internal Medicine

## 2019-01-24 DIAGNOSIS — M4646 Discitis, unspecified, lumbar region: Secondary | ICD-10-CM

## 2019-02-02 DIAGNOSIS — M4316 Spondylolisthesis, lumbar region: Secondary | ICD-10-CM | POA: Diagnosis not present

## 2019-02-02 DIAGNOSIS — M5416 Radiculopathy, lumbar region: Secondary | ICD-10-CM | POA: Diagnosis not present

## 2019-02-02 DIAGNOSIS — M4620 Osteomyelitis of vertebra, site unspecified: Secondary | ICD-10-CM | POA: Diagnosis not present

## 2019-02-05 ENCOUNTER — Other Ambulatory Visit: Payer: Self-pay | Admitting: Neurological Surgery

## 2019-02-05 DIAGNOSIS — M48062 Spinal stenosis, lumbar region with neurogenic claudication: Secondary | ICD-10-CM

## 2019-02-06 ENCOUNTER — Ambulatory Visit
Admission: RE | Admit: 2019-02-06 | Discharge: 2019-02-06 | Disposition: A | Discharge status: Home / Self Care | Payer: Medicare Other | Source: Ambulatory Visit | Attending: Neurological Surgery | Admitting: Neurological Surgery

## 2019-02-06 ENCOUNTER — Other Ambulatory Visit: Payer: Self-pay

## 2019-02-06 DIAGNOSIS — M48062 Spinal stenosis, lumbar region with neurogenic claudication: Secondary | ICD-10-CM

## 2019-02-06 DIAGNOSIS — M48061 Spinal stenosis, lumbar region without neurogenic claudication: Secondary | ICD-10-CM | POA: Diagnosis not present

## 2019-02-06 MED ORDER — GADOBENATE DIMEGLUMINE 529 MG/ML IV SOLN
20.0000 mL | Freq: Once | INTRAVENOUS | Status: AC | PRN
  Administered 2019-02-06: 20 mL via INTRAVENOUS

## 2019-02-07 DIAGNOSIS — M5416 Radiculopathy, lumbar region: Secondary | ICD-10-CM | POA: Diagnosis not present

## 2019-02-12 ENCOUNTER — Other Ambulatory Visit: Payer: Self-pay | Admitting: Neurological Surgery

## 2019-02-12 ENCOUNTER — Encounter: Payer: Self-pay | Admitting: Internal Medicine

## 2019-02-12 ENCOUNTER — Other Ambulatory Visit: Payer: Self-pay | Admitting: Internal Medicine

## 2019-02-12 ENCOUNTER — Telehealth: Payer: Self-pay | Admitting: *Deleted

## 2019-02-12 DIAGNOSIS — N760 Acute vaginitis: Secondary | ICD-10-CM | POA: Insufficient documentation

## 2019-02-12 DIAGNOSIS — M5416 Radiculopathy, lumbar region: Secondary | ICD-10-CM

## 2019-02-12 MED ORDER — FLUCONAZOLE 150 MG PO TABS: ORAL_TABLET | ORAL | 2 refills | Status: DC

## 2019-02-12 NOTE — Telephone Encounter (Signed)
RN left message for patient with update, pharmacy and prescription information. Thanks!

## 2019-02-12 NOTE — Telephone Encounter (Signed)
I sent in a prescription for fluconazole 150 mg to be taken once daily for 3 days and then once weekly thereafter.

## 2019-02-12 NOTE — Telephone Encounter (Signed)
Patient having symptoms of vaginal yeast infection as well as yeast-type rash in the folds of her skin.  She states diflucan has been helpful in the past. She has been taking 1 tablet diflucan 150 mg, followed by 2nd dose 2 days later. This works for about 1 week before her symptoms return.  She is currently taking doxycycline 100 mg twice daily as suppressive antibiotic therapy for lumbar discitis. She follows up in clinic 04/18/2019 with Dr Megan Salon. Please advise.  Patient uses [facility] at [address]. Landis Gandy, RN\

## 2019-02-19 DIAGNOSIS — M1712 Unilateral primary osteoarthritis, left knee: Secondary | ICD-10-CM | POA: Diagnosis not present

## 2019-02-22 ENCOUNTER — Ambulatory Visit
Admission: RE | Admit: 2019-02-22 | Discharge: 2019-02-22 | Disposition: A | Discharge status: Home / Self Care | Payer: Medicare Other | Source: Ambulatory Visit | Attending: Neurological Surgery | Admitting: Neurological Surgery

## 2019-02-22 ENCOUNTER — Other Ambulatory Visit: Payer: Self-pay

## 2019-02-22 DIAGNOSIS — M5126 Other intervertebral disc displacement, lumbar region: Secondary | ICD-10-CM | POA: Diagnosis not present

## 2019-02-22 DIAGNOSIS — M5416 Radiculopathy, lumbar region: Secondary | ICD-10-CM

## 2019-02-22 DIAGNOSIS — M48061 Spinal stenosis, lumbar region without neurogenic claudication: Secondary | ICD-10-CM | POA: Diagnosis not present

## 2019-02-23 DIAGNOSIS — M79604 Pain in right leg: Secondary | ICD-10-CM | POA: Diagnosis not present

## 2019-02-27 DIAGNOSIS — R300 Dysuria: Secondary | ICD-10-CM | POA: Diagnosis not present

## 2019-02-27 DIAGNOSIS — N390 Urinary tract infection, site not specified: Secondary | ICD-10-CM | POA: Diagnosis not present

## 2019-03-08 ENCOUNTER — Ambulatory Visit: Payer: Medicare Other | Admitting: Internal Medicine

## 2019-03-19 ENCOUNTER — Other Ambulatory Visit: Payer: Self-pay | Admitting: Internal Medicine

## 2019-03-19 DIAGNOSIS — M4646 Discitis, unspecified, lumbar region: Secondary | ICD-10-CM

## 2019-04-12 ENCOUNTER — Ambulatory Visit (INDEPENDENT_AMBULATORY_CARE_PROVIDER_SITE_OTHER): Payer: Medicare Other | Admitting: Internal Medicine

## 2019-04-12 ENCOUNTER — Other Ambulatory Visit: Payer: Self-pay

## 2019-04-12 DIAGNOSIS — M4646 Discitis, unspecified, lumbar region: Secondary | ICD-10-CM

## 2019-04-12 NOTE — Progress Notes (Signed)
Virtual Visit via Telephone Note  I connected with [name] on 04/12/19 at 11:00 AM EST by telephone and verified that I am speaking with the correct person using two identifiers.  Location: Patient: Home Provider: [facility]   I discussed the limitations, risks, security and privacy concerns of performing an evaluation and management service by telephone and the availability of in person appointments. I also discussed with the patient that there may be a patient responsible charge related to this service. The patient expressed understanding and agreed to proceed.   History of Present Illness: I called and spoke to [name] by phone today.  She had an epidural injection on 05/23/2018. Shortly afterwards she developed acute on chronic low back pain. She was seen in the ED on 05/31/2018 and an abdominal CT scan showed what appeared to be a pancreatic mass. She returned with increasing pain and was admittedon 06/02/2018. CT and MRI revealed L1-2 discitis with a thin epidural abscess.Lumbar aspirate and blood cultures grew methicillin-resistant coagulase-negative staph. She was started on vancomycin and I recommended 6 weeks of total antibiotic therapy to be completed on 07/16/2018. She was discharged.   She was readmitted on 07/11/2018 because of increasing back pain. MRI revealed progressive deterioration of the L1-2 disc space with more prominent osteomyelitis and epidural abscess. She underwent aspiration of the abscess on 07/13/2018. Gram stain and cultures were negative. Vancomycin was continued. She underwent L1-2 decompression and T10-L4 fusion on 07/28/2018 Dr. Kristeen Miss. No specimens were sent for stain or culture.   My partner decided to extend her IV vancomycin therapy for a full 10 weeks through 08/12/2018.  I had her transition to oral doxycycline at that time.    She has had no problems tolerating doxycycline.  She is feeling much better and not having any back pain at this  time.  I had several conversations with her over the past 7 months about the relative pros and cons of continuing doxycycline.  She feels that because of the severe persistent nature of her original infection she would prefer to stay on chronic, suppressive doxycycline as long as she is tolerating it.   Observations/Objective: Sed Rate  Date Value  12/20/2018 34 mm/h (H)  10/11/2018 39 mm/h (H)  06/05/2018 102 mm/hr (H)   CRP  Date Value  12/20/2018 5.3 mg/L  10/11/2018 7.1 mg/L  06/05/2018 15.5 mg/dL (H)    Assessment and Plan: [name] is improved on chronic suppressive doxycycline for severe MRSA vertebral infection.  I agree with her plan to continue doxycycline for now as long as she is tolerating it.  Follow Up Instructions: Continue doxycycline for now Follow-up in 3 months   I discussed the assessment and treatment plan with the patient. The patient was provided an opportunity to ask questions and all were answered. The patient agreed with the plan and demonstrated an understanding of the instructions.   The patient was advised to call back or seek an in-person evaluation if the symptoms worsen or if the condition fails to improve as anticipated.  I provided 13 minutes of non-face-to-face time during this encounter.   Michel Bickers, MD

## 2019-04-20 ENCOUNTER — Ambulatory Visit: Payer: Medicare Other

## 2019-04-26 ENCOUNTER — Ambulatory Visit: Payer: Medicare Other | Attending: Internal Medicine

## 2019-04-26 DIAGNOSIS — Z23 Encounter for immunization: Secondary | ICD-10-CM | POA: Insufficient documentation

## 2019-04-26 NOTE — Progress Notes (Signed)
   Covid-19 Vaccination Clinic  Name:  [name]    MRN: [id number] DOB: 16-Aug-1950  04/26/2019  [name] was observed post Covid-19 immunization for 15 minutes without incidence. She was provided with Vaccine Information Sheet and instruction to access the V-Safe system.   [name] was instructed to call 911 with any severe reactions post vaccine: Marland Kitchen Difficulty breathing  . Swelling of your face and throat  . A fast heartbeat  . A bad rash all over your body  . Dizziness and weakness    Immunizations Administered    Name Date Dose VIS Date Route   Pfizer COVID-19 Vaccine 04/26/2019  3:33 PM 0.3 mL 03/02/2019 Intramuscular   Manufacturer: [place]   Lot: [id number]   [place]: [id number]

## 2019-05-07 ENCOUNTER — Ambulatory Visit: Payer: Medicare Other

## 2019-05-15 ENCOUNTER — Other Ambulatory Visit: Payer: Self-pay | Admitting: Gastroenterology

## 2019-05-15 DIAGNOSIS — K862 Cyst of pancreas: Secondary | ICD-10-CM

## 2019-05-21 ENCOUNTER — Ambulatory Visit: Payer: Medicare Other | Attending: Internal Medicine

## 2019-05-21 DIAGNOSIS — Z23 Encounter for immunization: Secondary | ICD-10-CM | POA: Insufficient documentation

## 2019-05-21 NOTE — Progress Notes (Signed)
   Covid-19 Vaccination Clinic  Name:  [name]    MRN: [id number] DOB: [birth year]  05/21/2019  [name] was observed post Covid-19 immunization for 15 minutes without incidence. She was provided with Vaccine Information Sheet and instruction to access the V-Safe system.   [name] was instructed to call 911 with any severe reactions post vaccine: Marland Kitchen Difficulty breathing  . Swelling of your face and throat  . A fast heartbeat  . A bad rash all over your body  . Dizziness and weakness    Immunizations Administered    Name Date Dose VIS Date Route   Pfizer COVID-19 Vaccine 05/21/2019  5:07 PM 0.3 mL 03/02/2019 Intramuscular   Manufacturer: [place]   Lot: [id number]   [place]: [id number]

## 2019-06-07 ENCOUNTER — Telehealth: Payer: Self-pay | Admitting: Gastroenterology

## 2019-06-07 ENCOUNTER — Ambulatory Visit (HOSPITAL_COMMUNITY): Admission: RE | Admit: 2019-06-07 | Payer: Medicare Other | Source: Ambulatory Visit

## 2019-06-07 DIAGNOSIS — K862 Cyst of pancreas: Secondary | ICD-10-CM

## 2019-06-07 NOTE — Telephone Encounter (Signed)
Patient called to cancel her MRI appt today at [facility] she said she didn't know where it was going to be and that she does not fit on their beds and needs to go elsewhere for the MRI

## 2019-06-07 NOTE — Telephone Encounter (Signed)
I spoke with [facility] and they will call the pt to make appt

## 2019-06-18 DIAGNOSIS — E038 Other specified hypothyroidism: Secondary | ICD-10-CM | POA: Diagnosis not present

## 2019-06-20 DIAGNOSIS — D649 Anemia, unspecified: Secondary | ICD-10-CM | POA: Diagnosis not present

## 2019-06-20 DIAGNOSIS — Z79899 Other long term (current) drug therapy: Secondary | ICD-10-CM | POA: Diagnosis not present

## 2019-06-20 DIAGNOSIS — E1169 Type 2 diabetes mellitus with other specified complication: Secondary | ICD-10-CM | POA: Diagnosis not present

## 2019-06-22 ENCOUNTER — Telehealth: Payer: Self-pay | Admitting: Adult Health

## 2019-06-22 NOTE — Telephone Encounter (Signed)
Received a new hem referral from D.r Perini for pancytopenia. [name] has been cld and scheduled to see [name] on 4/20 at 9am w/labs at 830am. Pt aware to arrive 15 minutes early.

## 2019-07-06 ENCOUNTER — Other Ambulatory Visit: Payer: Medicare Other

## 2019-07-09 ENCOUNTER — Other Ambulatory Visit: Payer: Self-pay | Admitting: Internal Medicine

## 2019-07-09 ENCOUNTER — Other Ambulatory Visit: Payer: Self-pay | Admitting: *Deleted

## 2019-07-09 ENCOUNTER — Ambulatory Visit
Admission: RE | Admit: 2019-07-09 | Discharge: 2019-07-09 | Disposition: A | Discharge status: Home / Self Care | Payer: Medicare Other | Source: Ambulatory Visit | Attending: Gastroenterology | Admitting: Gastroenterology

## 2019-07-09 DIAGNOSIS — R932 Abnormal findings on diagnostic imaging of liver and biliary tract: Secondary | ICD-10-CM | POA: Diagnosis not present

## 2019-07-09 DIAGNOSIS — R9389 Abnormal findings on diagnostic imaging of other specified body structures: Secondary | ICD-10-CM | POA: Diagnosis not present

## 2019-07-09 DIAGNOSIS — K862 Cyst of pancreas: Secondary | ICD-10-CM

## 2019-07-09 DIAGNOSIS — M4646 Discitis, unspecified, lumbar region: Secondary | ICD-10-CM

## 2019-07-09 MED ORDER — GADOBENATE DIMEGLUMINE 529 MG/ML IV SOLN
20.0000 mL | Freq: Once | INTRAVENOUS | Status: AC | PRN
  Administered 2019-07-09: 20 mL via INTRAVENOUS

## 2019-07-09 NOTE — Progress Notes (Signed)
ERROR

## 2019-07-09 NOTE — Progress Notes (Addendum)
[place]  [phone] [phone]     ID: [name] DOB: [birth year]  MR#: [id number]  BEE#:[id number]  Patient Care Team: Crist Infante, MD as PCP - General (Internal Medicine) Raynelle Bring, MD as Consulting Physician (Urology) Milus Banister, MD as Attending Physician (Gastroenterology) Michel Bickers, MD as Consulting Physician (Infectious Diseases) Magrinat, Virgie Dad, MD as Consulting Physician (Oncology) Chauncey Cruel, MD OTHER MD:  CHIEF COMPLAINT: pancytopenia  CURRENT TREATMENT: to receive IV iron   HISTORY OF CURRENT ILLNESS:  [name] is being seen today at the request of Dr. Joylene Draft for her pancytopenia.  She sought an urgent appointment with him back in 05/2019 due to falling asleep easily.  This revealed that she had decreased WBC, HGB, and Plt counts.  Her WBC was 2.2 with an ANC of 1.1; Her hemoglobin 9 with an MCV of 81.4; and her platelet count of 138.  Her creatinine at that time was 1.1.  In 09/2018 her CBC was similar, however that was following/during a MRSA abscess hospitalization and recovery time period.  In reviewing her available labs in Epic, she appears to have a longstanding anemia, more recently microcytic see below.  She is taking b12 supplementation for b12 deficiency.   Ref. Range 12/18/2007 09:33 12/27/2007 03:40 08/15/2012 11:51 12/10/2013 09:00 07/21/2014 07:00 06/25/2015 11:45 01/06/2017 13:15 07/13/2017 10:40 10/11/2018 16:09 07/10/2019 08:49  Hemoglobin Latest Ref Range: 12.0 - 15.0 g/dL 12.3 11.4 (L) 10.9 (L) 9.9 (L) 10.9 (L) 9.9 (L) 9.2 (L) 11.7 (L) 9.6 (L) 8.3 (L)    Ref. Range 12/18/2007 09:33 12/27/2007 03:40 08/15/2012 11:51 12/10/2013 09:00 07/21/2014 07:00 06/25/2015 11:45 01/06/2017 13:15 07/13/2017 10:40 10/11/2018 16:09 07/10/2019 08:49  MCV Latest Ref Range: 80.0 - 100.0 fL 86.3 86.4 86.2 87.8 88.3 86.1 83.1 85.0 79.4 (L) 77.3 (L)   Her WBC and platelets have been as follows:    Ref. Range 12/18/2007 09:33  12/27/2007 03:40 08/15/2012 11:51 12/10/2013 09:00 07/21/2014 07:00 06/25/2015 11:45 01/06/2017 13:15 07/13/2017 10:40 10/11/2018 16:09 07/10/2019 08:49  WBC Latest Ref Range: 4.0 - 10.5 K/uL 4.2 6.7 4.1 5.6 3.5 (L) 9.7 4.2 2.7 (L) 2.1 (L) 2.3 (L)    Ref. Range 12/18/2007 09:33 12/27/2007 03:40 08/15/2012 11:51 12/10/2013 09:00 07/21/2014 07:00 06/25/2015 11:45 01/06/2017 13:15 07/13/2017 10:40 10/11/2018 16:09 07/10/2019 08:49  Platelets Latest Ref Range: 150 - 400 K/uL 202 197 163 194 144 (L) 156 257 158 173 139 (L)   The patient's subsequent history is as detailed below.  INTERVAL HISTORY: Jase notes she is feeling moderately well today.  She has multiple comorbidities.  She understands that when she went to see her PCP to evaluate her fatigue, her counts were decreased.  We are going to evaluate her today for that.    [name] notes that she has no night sweats, lymphadenopathy, easy bruising/bleeding, blood in her stool, black tarry stool, or pain.  She does have a pancreatic cyst that is under observation with Dr. Ardis Hughs.  Her most recent MRCP was completed yesterday.  This showed a 3.2 cm pancreatic cyst along with splenomegaly.    REVIEW OF SYSTEMS: [name] denies any fever, chills, chest pain, palpitations, cough, shortness of breath, nausea, vomiting, appetite changes, bowel/bladder changes.    She does have difficulty being active with her arthritis, and knee issues.  She is morbidly obese, and has difficulty exercising. A detailed ROS was otherwise non contributory.    PAST MEDICAL HISTORY: Past Medical History:  Diagnosis Date  . Anemia   .  Arthritis   . Asthma    OCCAS  . Bell's palsy   . Broken neck ([facility])    C-1  . Carbuncle and furuncle of trunk   . Chronic dislocation of right shoulder   . Depression   . Disorder of fascia    HX OF NECROTIC FASCITIS AFTER ABDOMINAL SURGERY FOR HERNIA- REQUIRED 19 SURGERIES AND 2.5 MONTH HOSPITALIZATION AT [facility]  . DM (diabetes mellitus) ([facility])   .  Dysrhythmia    HX OF TACHYCARDIA AND BRADYCARDIA - ON GOING FOR YEARS - DOES NOT HAVE TO SEE CARDIOLOGIST  . Elevated cholesterol   . Fibromyalgia   . Fracture MAY 2014   HX OF FRACTURED NECK C1- CAUSES SEVERE HEADACHES--LIMITED ROM NECK  . Frequent infections    ESPECIALLY PRONE TO INFECTIONS AFTER SURGERIES  . Graves disease   . Heart murmur    DOES NOT CAUSE ANY PROBLEMS  . History of kidney stones   . Hyperlipidemia   . Hypertension   . Hypothyroidism    GRAVES DISEASE  . Migraine   . 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 Neuropathy   . Obesity   . OSA (obstructive sleep apnea)    USES CPAP - DOES NOT KNOW SETTING  . Pain    LEFT SHOULDER  PAIN -HARD TO LIE ON LEFT SIDE FOR LONG PERIOD;  PAIN IN LOWER BADK - 3 HERNIATED DISCS AND STENOISIS -  . PONV (postoperative nausea and vomiting)    THE GAS MAKES ME NAUSEATED  . Restless leg syndrome   . Sciatica   . Tachycardia   . Urinary frequency   . Urticaria   . UTI (urinary tract infection)     PAST SURGICAL HISTORY: Past Surgical History:  Procedure Laterality Date  . ABDOMINAL HYSTERECTOMY     LARGE TUMOR AT OVARY REMOVED  . ANTERIOR LAT LUMBAR FUSION N/A 07/28/2018   Procedure: Anterolateral Decompression Lumbar One-Two for osteomyelitis reconstruction w/titanium strut allograft Fusion Lumbar One-Two;  Surgeon: Kristeen Miss, MD;  Location: [facility];  Service: Neurosurgery;  Laterality: N/A;  Left anterolateral approach  . APPENDECTOMY    . APPLICATION OF ROBOTIC ASSISTANCE FOR SPINAL PROCEDURE N/A 07/28/2018   Procedure: APPLICATION OF ROBOTIC ASSISTANCE FOR SPINAL PROCEDURE;  Surgeon: Kristeen Miss, MD;  Location: [facility];  Service: Neurosurgery;  Laterality: N/A;  . CARPAL TUNNEL RELEASE     BILATERAL  . CATARACT EXTRACTION W/ INTRAOCULAR LENS  IMPLANT, BILATERAL    . CESAREAN SECTION     X 3  . CHOLECYSTECTOMY    . CYSTOSCOPY WITH URETEROSCOPY Right 12/03/2013   Procedure: CYSTOSCOPY WITH RIGHT  RETROGRADE URETEROSCOPY LASER LITHOTRIPSY RIGHT STONE RIGHT URETERAL STENT, ;  Surgeon: Raynelle Bring, MD;  Location: [facility];  Service: Urology;  Laterality: Right;  PROCEDURE WAS ORIGINALLY SCHEDULED AS RIGHT PERCUTANEOUS NEPHROLITHOTOMY  . EYELID LACERATION REPAIR     RIGHT EYE  . HERNIA REPAIR     ABDOMINAL HERNIA REPAIR WITH MESH - 3 SURGERIES   . HX OF 19 SURGERIES FOR NECROTIC FASCITIS     SKIN GRAFTS+ WOUND  VAC  . I&D OF INFECTED SITE IN [facility] - FROM AN INJECTION    . IR FLUORO GUIDED NEEDLE PLC ASPIRATION/INJECTION LOC  06/03/2018  . JOINT REPLACEMENT     TOTAL RIGHT KNEE REPLACEMENT  . KNEE ARTHROSCOPY  RIGHT AND LEFT   X 2  . [facility] RESECTION  2014  . NEPHROLITHOTOMY  Right 12/10/2013   Procedure: NEPHROLITHOTOMY PERCUTANEOUS;  Surgeon: Raynelle Bring, MD;  Location[facility];  Service: Urology;  Laterality: Right;  . POSTERIOR LUMBAR FUSION 4 LEVEL N/A 07/28/2018   Procedure: Posterior Fixation Thoracic Ten-Lumbar Four with pedicle augmentaion with robotic assistance;  Surgeon: Kristeen Miss, MD;  Location: [facility];  Service: Neurosurgery;  Laterality: N/A;  Posterior Fixation Thoracic Ten-Lumbar Four with pedicle augmentaion with robotic assistance  . REVERSE SHOULDER ARTHROPLASTY Right 07/21/2017  . REVERSE SHOULDER ARTHROPLASTY Right 07/21/2017   Procedure: RIGHT REVERSE SHOULDER ARTHROPLASTY;  Surgeon: Tania Ade, MD;  Location: [facility];  Service: Orthopedics;  Laterality: Right;  . RIGHT FOOT DRAINAGE OF INFECTION    . shoulder arthroscopy Right    X 2  . TONSILLECTOMY     AND ADENOIDECTOMY    FAMILY HISTORY Family History  Problem Relation Age of Onset  . Diabetes Father   . Osteoarthritis Father   . Heart disease Father   . Ulcers Father   . Stroke Mother   . Suicidality Sister     GYNECOLOGIC HISTORY:  No LMP recorded. Patient has had a hysterectomy. Menarche: 69 years old Age at first live birth: 69 years old GX P 3 LMP 40 years  ago Contraceptive1 year OCP HRT 5 years Hysterectomy? At age 35, still has cervix Salpingo-oophorectomy? At age 52, bilaterally    SOCIAL HISTORY: [name] is married and lives with her husband and her son in [place], [place].  She is retired, however due to her multiple health issues has been on disability as well.  She has two other sons who also live in [place], [place]: not in place   HEALTH MAINTENANCE: Social History   Tobacco Use  . Smoking status: Never Smoker  . Smokeless tobacco: Never Used  Substance Use Topics  . Alcohol use: No  . Drug use: No     Colonoscopy: 11 years ago  PAP: many years ago, says still has cervix  Bone density: unknown  Mammogram: many years ago, cant recall when   Allergies  Allergen Reactions  . Imitrex [Sumatriptan] Other (See Comments)    Vascular spasms  . Nsaids Other (See Comments)    PT UNABLE TO TOLERATE NSAID'S DRUGS DUE TO HX OF GASTRIC SLEEVE SURGERY  . Rosuvastatin Calcium Other (See Comments)    Leg/muscle pain  . Statins Other (See Comments)    Leg/muscle pain  . Sulfonamide Derivatives Hives    Childhood allergy   . Codeine Nausea Only  . Promethazine Hcl Other (See Comments)    Restless leg feeling all over body  . Vicodin [Hydrocodone-Acetaminophen] Nausea And Vomiting    Current Outpatient Medications  Medication Sig Dispense Refill  . ARIPiprazole (ABILIFY) 5 MG tablet Take 1 tablet (5 mg total) by mouth daily. 30 tablet 1  . BD INSULIN SYRINGE U/F 31G X 5/16" 0.5 ML MISC USE 5 TIMES A DAY LEVEMIR AND NOVOLOG    . Cholecalciferol (VITAMIN D3 PO) Take 1 tablet by mouth daily.     . clonazePAM (KLONOPIN) 0.5 MG tablet Take 0.5 mg by mouth at bedtime as needed for anxiety. Up to 3 times daily as needed for anxiety, take 1 tablet (0.5 mg) scheduled each night at bedtime.  0  . Cyanocobalamin (VITAMIN B 12 PO) Take 1 tablet by mouth daily.     . cyclobenzaprine (FLEXERIL) 10 MG tablet Take 10  mg by mouth See admin instructions. Take 1 tablet (10  mg) every night at bedtime, may take an additional tablet during the day as needed for muscle spasms  0  . docusate sodium (COLACE) 100 MG capsule Take 1 capsule (100 mg total) by mouth 2 (two) times daily. 30 capsule 0  . doxazosin (CARDURA) 2 MG tablet     . fenofibrate 54 MG tablet Take 54 mg by mouth daily with supper.  2  . fluconazole (DIFLUCAN) 150 MG tablet Take 1 tablet by mouth daily for 3 days then once weekly. 6 tablet 2  . FLUoxetine (PROZAC) 20 MG capsule Take 60 mg by mouth at bedtime.   3  . FREESTYLE TEST STRIPS test strip USE STRIPS TO TEST BLOOD GLUCOSE BID    . furosemide (LASIX) 20 MG tablet TK 2 TS PO QAM AND 1 T QPM    . gabapentin (NEURONTIN) 100 MG capsule     . insulin aspart (NOVOLOG) 100 UNIT/ML FlexPen Sliding scale dose: CBG < 70: implement hypoglycemia protocol  CBG 70 - 120: 0 units  CBG 121 - 150: 0 units  CBG 151 - 200: 0 units  CBG 201 - 250: 2 units  CBG 251 - 300: 3 units  CBG 301 - 350: 4 units  CBG 351 - 400: 5 units 15 mL 0  . insulin glargine (LANTUS) 100 UNIT/ML injection Inject 0.2 mLs (20 Units total) into the skin 2 (two) times daily. 10 mL 0  . Lancets Misc. (UNISTIK 3 COMFORT) MISC USE LANCETS TO CHECK BLOOD GLUCOSE BID    . methocarbamol (ROBAXIN) 500 MG tablet Take 1 tablet (500 mg total) by mouth every 6 (six) hours as needed for muscle spasms. 20 tablet 0  . metoprolol tartrate (LOPRESSOR) 50 MG tablet     . omeprazole (PRILOSEC) 20 MG capsule Take 20 mg by mouth daily with supper.     . ondansetron (ZOFRAN) 4 MG tablet Take 2 tablets (8 mg total) by mouth every 8 (eight) hours as needed for refractory nausea / vomiting. For nausea/vomiting prevention with pain medication. 30 tablet 2  . PREVIDENT 5000 DRY MOUTH 1.1 % GEL dental gel Take 1 application by mouth daily.    Marland Kitchen propylthiouracil (PTU) 50 MG tablet Take 75 mg by mouth daily.   0  . rOPINIRole (REQUIP) 2 MG tablet Take 3 mg by  mouth at bedtime.     . BD PEN NEEDLE MICRO U/F 32G X 6 MM MISC Inject 32 pens into the skin daily.    . diclofenac Sodium (VOLTAREN) 1 % GEL Apply 1 g topically as needed.    . doxycycline (VIBRA-TABS) 100 MG tablet Take 1 tablet (100 mg total) by mouth 2 (two) times daily. 60 tablet 11  . gabapentin (NEURONTIN) 300 MG capsule Take 1 capsule (300 mg total) by mouth 3 (three) times daily for 30 days. 90 capsule 0  . metoprolol tartrate (LOPRESSOR) 25 MG tablet Take 0.5 tablets (12.5 mg total) by mouth 3 (three) times daily for 30 days. 45 tablet 0  . oxybutynin (DITROPAN-XL) 10 MG 24 hr tablet Take 10 mg by mouth daily.    . QUEtiapine (SEROQUEL) 50 MG tablet Take 50 mg by mouth at bedtime.    . sodium phosphate (FLEET) 7-19 GM/118ML ENEM Place 133 mLs (1 enema total) rectally once as needed for severe constipation. (Patient not taking: Reported on 07/10/2019) 1 enema 0  . tiZANidine (ZANAFLEX) 4 MG tablet Take 4 mg by mouth 2 (two) times daily.     No  current facility-administered medications for this visit.    OBJECTIVE:  Vitals:   07/10/19 0905  BP: (!) 153/54  Pulse: 68  Resp: 18  Temp: 98.2 F (36.8 C)  SpO2: 99%     Body mass index is 45.33 kg/m.   Wt Readings from Last 3 Encounters:  07/11/19 241 lb (109.3 kg)  07/10/19 239 lb 14.4 oz (108.8 kg)  12/04/18 220 lb (99.8 kg)  ECOG FS:2 - Symptomatic, <50% confined to bed  GENERAL: Patient is a chronically ill appearing morbidly obese woman, examined in chair HEENT:  Sclerae anicteric.  Oropharynx clear and moist. No ulcerations or evidence of oropharyngeal candidiasis. Neck is supple.  NODES:  No cervical, supraclavicular, or axillary lymphadenopathy palpated.  LUNGS:  Clear to auscultation bilaterally.  No wheezes or rhonchi. HEART:  Regular rate and rhythm. No murmur appreciated. ABDOMEN:  Soft, nontender.  Positive, normoactive bowel sounds.  MSK:  No focal spinal tenderness to palpation.  EXTREMITIES:  No peripheral  edema.   SKIN:  Clear with no obvious rashes or skin changes. NEURO:  Nonfocal. Well oriented.  Appropriate affect.    LAB RESULTS:  CMP     Component Value Date/Time   NA 140 07/10/2019 0849   K 4.7 07/10/2019 0849   CL 103 07/10/2019 0849   CO2 28 07/10/2019 0849   GLUCOSE 122 (H) 07/10/2019 0849   BUN 34 (H) 07/10/2019 0849   CREATININE 1.31 (H) 07/10/2019 0849   CREATININE 0.85 10/11/2018 1609   CALCIUM 9.7 07/10/2019 0849   PROT 7.5 07/10/2019 0849   ALBUMIN 3.8 07/10/2019 0849   AST 22 07/10/2019 0849   ALT 17 07/10/2019 0849   ALKPHOS 98 07/10/2019 0849   BILITOT 0.3 07/10/2019 0849   GFRNONAA 42 (L) 07/10/2019 0849   GFRAA 49 (L) 07/10/2019 0849    No results found for: TOTALPROTELP, ALBUMINELP, A1GS, A2GS, BETS, BETA2SER, GAMS, MSPIKE, SPEI  No results found for: KPAFRELGTCHN, LAMBDASER, KAPLAMBRATIO  Lab Results  Component Value Date   WBC 2.3 (L) 07/10/2019   NEUTROABS 1.2 (L) 07/10/2019   HGB 8.3 (L) 07/10/2019   HCT 28.0 (L) 07/10/2019   MCV 77.3 (L) 07/10/2019   PLT 139 (L) 07/10/2019      Chemistry      Component Value Date/Time   NA 140 07/10/2019 0849   K 4.7 07/10/2019 0849   CL 103 07/10/2019 0849   CO2 28 07/10/2019 0849   BUN 34 (H) 07/10/2019 0849   CREATININE 1.31 (H) 07/10/2019 0849   CREATININE 0.85 10/11/2018 1609      Component Value Date/Time   CALCIUM 9.7 07/10/2019 0849   ALKPHOS 98 07/10/2019 0849   AST 22 07/10/2019 0849   ALT 17 07/10/2019 0849   BILITOT 0.3 07/10/2019 0849       No results found for: LABCA2  No components found for: TGPQDI264  No results for input(s): INR in the last 168 hours.  No results found for: LABCA2  No results found for: BRA309  No results found for: MMH680  No results found for: SUP103  No results found for: CA2729  No components found for: HGQUANT  No results found for: CEA1 / No results found for: CEA1   No results found for: AFPTUMOR  No results found for:  CHROMOGRNA  No results found for: PSA1  Appointment on 07/10/2019  Component Date Value Ref Range Status  . Smear Review 07/10/2019 SMEAR STAINED AND AVAILABLE FOR REVIEW   Final   Performed at  [facility], [place] [address], [place], [place] [address]  . Retic Ct Pct 07/10/2019 1.5  0.4 - 3.1 % Final  . RBC. 07/10/2019 3.64* 3.87 - 5.11 MIL/uL Final  . Retic Count, Absolute 07/10/2019 54.6  19.0 - 186.0 K/uL Final  . Immature Retic Fract 07/10/2019 19.3* 2.3 - 15.9 % Final   Performed at [facility], [place] [address], [place], [place] [address]  . Sodium 07/10/2019 140  135 - 145 mmol/L Final  . Potassium 07/10/2019 4.7  3.5 - 5.1 mmol/L Final  . Chloride 07/10/2019 103  98 - 111 mmol/L Final  . CO2 07/10/2019 28  22 - 32 mmol/L Final  . Glucose, Bld 07/10/2019 122* 70 - 99 mg/dL Final   Glucose reference range applies only to samples taken after fasting for at least 8 hours.  . BUN 07/10/2019 34* 8 - 23 mg/dL Final  . Creatinine 07/10/2019 1.31* 0.44 - 1.00 mg/dL Final  . Calcium 07/10/2019 9.7  8.9 - 10.3 mg/dL Final  . Total Protein 07/10/2019 7.5  6.5 - 8.1 g/dL Final  . Albumin 07/10/2019 3.8  3.5 - 5.0 g/dL Final  . AST 07/10/2019 22  15 - 41 U/L Final  . ALT 07/10/2019 17  0 - 44 U/L Final  . Alkaline Phosphatase 07/10/2019 98  38 - 126 U/L Final  . Total Bilirubin 07/10/2019 0.3  0.3 - 1.2 mg/dL Final  . GFR, Est Non Af Am 07/10/2019 42* >60 mL/min Final  . GFR, Est AFR Am 07/10/2019 49* >60 mL/min Final  . Anion gap 07/10/2019 9  5 - 15 Final   Performed at [facility], [place] [address], [place], [place] [address]  . WBC Count 07/10/2019 2.3* 4.0 - 10.5 K/uL Final  . RBC 07/10/2019 3.62* 3.87 - 5.11 MIL/uL Final  . Hemoglobin 07/10/2019 8.3* 12.0 - 15.0 g/dL Final   Comment: Reticulocyte Hemoglobin testing may be clinically indicated, consider ordering this additional test [id number]   . HCT  07/10/2019 28.0* 36.0 - 46.0 % Final  . MCV 07/10/2019 77.3* 80.0 - 100.0 fL Final  . MCH 07/10/2019 22.9* 26.0 - 34.0 pg Final  . MCHC 07/10/2019 29.6* 30.0 - 36.0 g/dL Final  . RDW 07/10/2019 14.8  11.5 - 15.5 % Final  . Platelet Count 07/10/2019 139* 150 - 400 K/uL Final  . nRBC 07/10/2019 0.0  0.0 - 0.2 % Final  . Neutrophils Relative % 07/10/2019 52  % Final  . Neutro Abs 07/10/2019 1.2* 1.7 - 7.7 K/uL Final  . Lymphocytes Relative 07/10/2019 36  % Final  . Lymphs Abs 07/10/2019 0.8  0.7 - 4.0 K/uL Final  . Monocytes Relative 07/10/2019 12  % Final  . Monocytes Absolute 07/10/2019 0.3  0.1 - 1.0 K/uL Final  . Eosinophils Relative 07/10/2019 0  % Final  . Eosinophils Absolute 07/10/2019 0.0  0.0 - 0.5 K/uL Final  . Basophils Relative 07/10/2019 0  % Final  . Basophils Absolute 07/10/2019 0.0  0.0 - 0.1 K/uL Final  . Immature Granulocytes 07/10/2019 0  % Final  . Abs Immature Granulocytes 07/10/2019 0.01  0.00 - 0.07 K/uL Final   Performed at [facility], [place] [address], [place], [place] [address]  . CRP 07/10/2019 0.6  <1.0 mg/dL Final   Performed at [facility] [address] [place], [place] [address]  . ANA Ab, IFA 07/10/2019 Negative   Final   Comment: (  NOTE)                                     Negative   <1:80                                     Borderline  1:80                                     Positive   >1:80 Performed At: [facility] [place], [place] [id number] Rush Farmer MD [id number]   . Sed Rate 07/10/2019 38* 0 - 22 mm/hr Final   Performed at [facility], [place] [address] [place], [place] [address]  . Ferritin 07/10/2019 9* 11 - 307 ng/mL Final   Performed at [facility], [place] [address], [place], [place] [address]  . Iron 07/10/2019 26* 41 - 142 ug/dL Final  . TIBC 07/10/2019 459* 236 - 444 ug/dL Final  . Saturation Ratios 07/10/2019 6* 21  - 57 % Final  . UIBC 07/10/2019 433* 120 - 384 ug/dL Final   Performed at [facility], [place] [address] [place], [place] [address]  . Folate 07/10/2019 37.4  >5.9 ng/mL Final   Comment: RESULTS CONFIRMED BY MANUAL DILUTION Performed at [facility] [address] [place], [place] [address]   . Vitamin B-12 07/10/2019 2,225* 180 - 914 pg/mL Final   Comment: RESULTS CONFIRMED BY MANUAL DILUTION (NOTE) This assay is not validated for testing neonatal or myeloproliferative syndrome specimens for Vitamin B12 levels. Performed at [facility], [place] [address] [place],  [address]     (this displays the last labs from the last 3 days)  No results found for: TOTALPROTELP, ALBUMINELP, A1GS, A2GS, BETS, BETA2SER, GAMS, MSPIKE, SPEI (this displays SPEP labs)  No results found for: KPAFRELGTCHN, LAMBDASER, KAPLAMBRATIO (kappa/lambda light chains)  No results found for: HGBA, HGBA2QUANT, HGBFQUANT, HGBSQUAN (Hemoglobinopathy evaluation)   No results found for: LDH  Lab Results  Component Value Date   IRON 26 (L) 07/10/2019   TIBC 459 (H) 07/10/2019   IRONPCTSAT 6 (L) 07/10/2019   (Iron and TIBC)  Lab Results  Component Value Date   FERRITIN 9 (L) 07/10/2019    Urinalysis    Component Value Date/Time   COLORURINE YELLOW 07/14/2018 0914   APPEARANCEUR CLEAR 07/14/2018 0914   LABSPEC 1.013 07/14/2018 0914   PHURINE 6.0 07/14/2018 0914   GLUCOSEU NEGATIVE 07/14/2018 0914   HGBUR NEGATIVE 07/14/2018 0914   BILIRUBINUR NEGATIVE 07/14/2018 0914   KETONESUR 5 (A) 07/14/2018 0914   PROTEINUR NEGATIVE 07/14/2018 0914   UROBILINOGEN 0.2 07/20/2014 1707   NITRITE NEGATIVE 07/14/2018 0914   LEUKOCYTESUR NEGATIVE 07/14/2018 0914     STUDIES: MR ABDOMEN MRCP W WO CONTAST  Result Date: 07/09/2019 CLINICAL DATA:  Follow-up pancreas cyst EXAM: MRI ABDOMEN WITHOUT AND WITH CONTRAST (INCLUDING MRCP) TECHNIQUE:  Multiplanar multisequence MR imaging of the abdomen was performed both before and after the administration of intravenous contrast. Heavily T2-weighted images of the biliary and pancreatic ducts were obtained, and three-dimensional MRCP images were rendered by post processing. CONTRAST:  59m MULTIHANCE GADOBENATE DIMEGLUMINE 529 MG/ML IV SOLN COMPARISON:  06/14/2018 FINDINGS: Lower chest: No  acute findings. Hepatobiliary: No mass or other parenchymal abnormality identified. Previous cholecystectomy. No biliary ductal dilatation. Pancreas: The nonenhancing multilocular cystic lesion within head of pancreas is again noted. This has a maximum dimension of 3.2 cm (taken from the coronal T2 images), image 21/3. Unchanged from previous exam. No new pancreas lesion identified. Spleen:  The spleen measures 12.9 x 11.6 by 11.3 cm, image 17/3. Adrenals/Urinary Tract: Normal appearance of the adrenal glands. No kidney mass or hydronephrosis. Stomach/Bowel: Visualized portions within the abdomen are unremarkable. Vascular/Lymphatic: No pathologically enlarged lymph nodes identified. No abdominal aortic aneurysm demonstrated. Other:  None. Musculoskeletal: No suspicious bone lesions identified. Previous posterior hardware fixation of the thoracolumbar spine. IMPRESSION: 1. Stable appearance of multilocular cystic lesion within head of pancreas. As mentioned previously this is consistent with cystic neoplasm. According to consensus criteria further evaluation with endoscopic ultrasound/FNA is recommended. This recommendation follows ACR consensus guidelines: Management of Incidental Pancreatic Cysts: A White Paper of the ACR Incidental Findings Committee. [place] 4742;[id number]. 2. Splenomegaly. Electronically Signed   By: Kerby Moors M.D.   On: 07/09/2019 14:25     ASSESSMENT: 69 y.o. [name] woman with multiple comorbidities referred to hematology for evaluation of pancytopenia.    1. Microcytic  Anemia  (a) likely secondary to iron deficiency--to receive IV iron x 2 beginning next week  (b) iron studies, b12, and folate pending  2. Neutropenia and thrombocytopenia  (a) Obtaining ESR/CRP/ANA  (b) likely secondary to splenomegaly  PLAN:  After reviewing her blood film, and the above with Dr. Jana Hakim, we sat down with [name] to review what is going on.    We reviewed that all blood cels are made in the marrow of bones, and that in review of [name] blood cells underneath the microscope, there is no evidence for leukemia in her cells.  We reviewed that there are three types of cells.    1. Red blood cells which carry oxygen.  [name] are decreased in number.  There are no schistoxytes, and no anisocytosis, but mild rouleaux.  Her MCV is decreased, and she has had a gastric sleeve several years ago.  She is iron deficient and we will set her up for IV iron with feraheme given by vein next week and the week after.  We reviewed that she may need a different type of IV iron depending on her insurance company and what they approve.  I sent Dr. Ardis Hughs a message about this, as she is overdue for colonoscopy, and does need GI work up.  2. White blood cells which are immune cells.  [name] are decreased in number, but normal in appearance.  This could very well be secondary to her splenomegaly, or other causes.  Additional labs are pending today.    3.  Platelets: These are very mildly decreased in number.  They are normal in appearance.  There is no clumping noted on the blood smear when reviewed.  This could also be secondary to her splenomegaly.  Additional labs, such as ESR, CRP, ANA are pending.    [name] will return in one week for IV iron with feraheme x 2.  I sent Dr. Ardis Hughs a message about her iron deficiency and work up, and also placed orders for her to have a screening mammogram completed at the breast center since she is overdue.   We will see [name] back about 6 weeks after she  completes her IV iron for follow up.  She was recommended to continue with  the appropriate pandemic precautions. She knows to call for any questions that may arise between now and her next appointment.  We are happy to see her sooner if needed.   Total encounter time: 60 minutes*  Wilber Bihari, NP 07/11/19 6:02 PM Medical Oncology and Hematology [facility] [place], [place] [address] Tel. [phone]    Fax. [phone]   ADDENDUM: 70 year old [place] woman presenting with a microcytic anemia, leukopenia and thrombocytopenia, in the setting of multiple comorbidities.  REVIEW OF THE PERIPHERAL BLOOD FILM shows no significant anisocytosis and specifically no tailed poikilocytes, no nucleated red cells, and no schistocytes.  There are minimal rouleaux.  The white cell series shows no immaturity.  Platelets are not clumped  While there is doubtless an element of anemia of chronic illness, and there is also mild renal insufficiency (together this means less EPO production l and less response to EPO) I think what is driving the anemia is iron deficiency, confirmed today with a ferritin of 9 and an iron saturation of 6.  Her hemoglobin should improve after iron supplementation and we have arranged for Feraheme x2.  She has a good understanding of the possible toxicity side effects and complications of these infusions and agrees to proceed.    I do not think we need to do a bone marrow biopsy to further evaluate the mild thrombocytopenia and neutropenia.  These are likely secondary to splenomegaly.  Here the normal review of blood film is very reassuring  We will follow up with [name] in about 8 weeks to make sure there has been an appropriate response and at that point I expect she will be released from follow-up here, back to her primary care physician.  I personally saw this patient and performed a substantive portion of this encounter with the listed APP  documented above.   Chauncey Cruel, MD Medical Oncology and Hematology [facility] [address] [place], [place] [address] Tel. [phone]    Fax. [phone]   *Total Encounter Time as defined by the Centers for Medicare and Medicaid Services includes, in addition to the face-to-face time of a patient visit (documented in the note above) non-face-to-face time: obtaining and reviewing outside history, ordering and reviewing medications, tests or procedures, care coordination (communications with other health care professionals or caregivers) and documentation in the medical record.

## 2019-07-10 ENCOUNTER — Telehealth: Payer: Self-pay | Admitting: Gastroenterology

## 2019-07-10 ENCOUNTER — Encounter: Payer: Self-pay | Admitting: Adult Health

## 2019-07-10 ENCOUNTER — Inpatient Hospital Stay: Payer: Medicare Other

## 2019-07-10 ENCOUNTER — Other Ambulatory Visit: Payer: Self-pay

## 2019-07-10 ENCOUNTER — Telehealth: Payer: Self-pay | Admitting: *Deleted

## 2019-07-10 ENCOUNTER — Inpatient Hospital Stay: Payer: Medicare Other | Attending: Adult Health | Admitting: Adult Health

## 2019-07-10 VITALS — BP 153/54 | HR 68 | Temp 98.2°F | Resp 18 | Ht 61.0 in | Wt 239.9 lb

## 2019-07-10 DIAGNOSIS — D709 Neutropenia, unspecified: Secondary | ICD-10-CM | POA: Diagnosis not present

## 2019-07-10 DIAGNOSIS — D508 Other iron deficiency anemias: Secondary | ICD-10-CM

## 2019-07-10 DIAGNOSIS — D61818 Other pancytopenia: Secondary | ICD-10-CM | POA: Diagnosis not present

## 2019-07-10 DIAGNOSIS — Z1231 Encounter for screening mammogram for malignant neoplasm of breast: Secondary | ICD-10-CM

## 2019-07-10 DIAGNOSIS — D509 Iron deficiency anemia, unspecified: Secondary | ICD-10-CM | POA: Diagnosis not present

## 2019-07-10 DIAGNOSIS — R161 Splenomegaly, not elsewhere classified: Secondary | ICD-10-CM | POA: Diagnosis not present

## 2019-07-10 DIAGNOSIS — D696 Thrombocytopenia, unspecified: Secondary | ICD-10-CM | POA: Diagnosis not present

## 2019-07-10 LAB — C-REACTIVE PROTEIN: CRP: 0.6 mg/dL (ref <1.0)

## 2019-07-10 LAB — CBC WITH DIFFERENTIAL (CANCER CENTER ONLY)
Abs Immature Granulocytes: 0.01 10*3/uL (ref 0.00–0.07)
Basophils Absolute: 0 10*3/uL (ref 0.0–0.1)
Basophils Relative: 0 %
Eosinophils Absolute: 0 10*3/uL (ref 0.0–0.5)
Eosinophils Relative: 0 %
HCT: 28 % — ABNORMAL LOW (ref 36.0–46.0)
Hemoglobin: 8.3 g/dL — ABNORMAL LOW (ref 12.0–15.0)
Immature Granulocytes: 0 %
Lymphocytes Relative: 36 %
Lymphs Abs: 0.8 10*3/uL (ref 0.7–4.0)
MCH: 22.9 pg — ABNORMAL LOW (ref 26.0–34.0)
MCHC: 29.6 g/dL — ABNORMAL LOW (ref 30.0–36.0)
MCV: 77.3 fL — ABNORMAL LOW (ref 80.0–100.0)
Monocytes Absolute: 0.3 10*3/uL (ref 0.1–1.0)
Monocytes Relative: 12 %
Neutro Abs: 1.2 10*3/uL — ABNORMAL LOW (ref 1.7–7.7)
Neutrophils Relative %: 52 %
Platelet Count: 139 10*3/uL — ABNORMAL LOW (ref 150–400)
RBC: 3.62 MIL/uL — ABNORMAL LOW (ref 3.87–5.11)
RDW: 14.8 % (ref 11.5–15.5)
WBC Count: 2.3 10*3/uL — ABNORMAL LOW (ref 4.0–10.5)
nRBC: 0 % (ref 0.0–0.2)

## 2019-07-10 LAB — CMP (CANCER CENTER ONLY)
ALT: 17 U/L (ref 0–44)
AST: 22 U/L (ref 15–41)
Albumin: 3.8 g/dL (ref 3.5–5.0)
Alkaline Phosphatase: 98 U/L (ref 38–126)
Anion gap: 9 (ref 5–15)
BUN: 34 mg/dL — ABNORMAL HIGH (ref 8–23)
CO2: 28 mmol/L (ref 22–32)
Calcium: 9.7 mg/dL (ref 8.9–10.3)
Chloride: 103 mmol/L (ref 98–111)
Creatinine: 1.31 mg/dL — ABNORMAL HIGH (ref 0.44–1.00)
GFR, Est AFR Am: 49 mL/min — ABNORMAL LOW (ref >60)
GFR, Estimated: 42 mL/min — ABNORMAL LOW (ref >60)
Glucose, Bld: 122 mg/dL — ABNORMAL HIGH (ref 70–99)
Potassium: 4.7 mmol/L (ref 3.5–5.1)
Sodium: 140 mmol/L (ref 135–145)
Total Bilirubin: 0.3 mg/dL (ref 0.3–1.2)
Total Protein: 7.5 g/dL (ref 6.5–8.1)

## 2019-07-10 LAB — RETICULOCYTES
Immature Retic Fract: 19.3 % — ABNORMAL HIGH (ref 2.3–15.9)
RBC.: 3.64 MIL/uL — ABNORMAL LOW (ref 3.87–5.11)
Retic Count, Absolute: 54.6 10*3/uL (ref 19.0–186.0)
Retic Ct Pct: 1.5 % (ref 0.4–3.1)

## 2019-07-10 LAB — IRON AND TIBC
Iron: 26 ug/dL — ABNORMAL LOW (ref 41–142)
Saturation Ratios: 6 % — ABNORMAL LOW (ref 21–57)
TIBC: 459 ug/dL — ABNORMAL HIGH (ref 236–444)
UIBC: 433 ug/dL — ABNORMAL HIGH (ref 120–384)

## 2019-07-10 LAB — FERRITIN: Ferritin: 9 ng/mL — ABNORMAL LOW (ref 11–307)

## 2019-07-10 LAB — SEDIMENTATION RATE: Sed Rate: 38 mm/hr — ABNORMAL HIGH (ref 0–22)

## 2019-07-10 LAB — VITAMIN B12: Vitamin B-12: 2225 pg/mL — ABNORMAL HIGH (ref 180–914)

## 2019-07-10 LAB — SAVE SMEAR(SSMR), FOR PROVIDER SLIDE REVIEW

## 2019-07-10 LAB — FOLATE: Folate: 37.4 ng/mL (ref >5.9)

## 2019-07-10 NOTE — Telephone Encounter (Signed)
Patient returned your call about results, please call patient one more time.   

## 2019-07-11 ENCOUNTER — Ambulatory Visit (INDEPENDENT_AMBULATORY_CARE_PROVIDER_SITE_OTHER): Payer: Medicare Other | Admitting: Internal Medicine

## 2019-07-11 ENCOUNTER — Encounter: Payer: Self-pay | Admitting: Internal Medicine

## 2019-07-11 ENCOUNTER — Telehealth: Payer: Self-pay | Admitting: Adult Health

## 2019-07-11 DIAGNOSIS — D61818 Other pancytopenia: Secondary | ICD-10-CM

## 2019-07-11 DIAGNOSIS — M4646 Discitis, unspecified, lumbar region: Secondary | ICD-10-CM | POA: Diagnosis not present

## 2019-07-11 LAB — ANTINUCLEAR ANTIBODIES, IFA: ANA Ab, IFA: NEGATIVE

## 2019-07-11 MED ORDER — DOXYCYCLINE HYCLATE 100 MG PO TABS: 100.0000 mg | ORAL_TABLET | Freq: Two times a day (BID) | ORAL | 11 refills | Status: DC

## 2019-07-11 NOTE — Telephone Encounter (Signed)
Scheduled appts per 4/20 los. Pt confirmed appt date and time.

## 2019-07-11 NOTE — Assessment & Plan Note (Signed)
She is much improved on therapy for methicillin-resistant coagulase-negative staph bacteremia and lumbar discitis.  Resolved but her sed rate remains elevated.  Talk to her again about the pros and cons of long-term suppressive doxycycline.  She prefers to continue it for now.  She will follow-up in 6 months.

## 2019-07-11 NOTE — Telephone Encounter (Signed)
The pancreatic cyst is certainly not bigger and may be a bit smaller (was 3.5cm on MRI last year). This is good news. Let's get a repeat MRI of the pancreas in 2 years. Thanks   Alante Weimann,  She needs MRI of pancreas in 2 years. Thanks  Milus Banister, MD  Gardenia Phlegm, NP; Timothy Lasso, RN  Absolutely. Thanks. We'll get her in.    Velmer Broadfoot,  She needs my first available OV or with extender for iron def anemia. Thanks

## 2019-07-11 NOTE — Telephone Encounter (Signed)
08/14/19 at 3 pm appt with Dr Ardis Hughs  Message to set pt up for MRI in 2 years.    Pt advised of all information.
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
## 2019-07-11 NOTE — Telephone Encounter (Signed)
Left message on machine to call back  

## 2019-07-16 ENCOUNTER — Inpatient Hospital Stay: Payer: Medicare Other

## 2019-07-16 ENCOUNTER — Other Ambulatory Visit: Payer: Self-pay

## 2019-07-16 VITALS — BP 148/55 | HR 64 | Temp 98.2°F | Resp 18

## 2019-07-16 DIAGNOSIS — D709 Neutropenia, unspecified: Secondary | ICD-10-CM | POA: Diagnosis not present

## 2019-07-16 DIAGNOSIS — D61818 Other pancytopenia: Secondary | ICD-10-CM | POA: Diagnosis not present

## 2019-07-16 DIAGNOSIS — D696 Thrombocytopenia, unspecified: Secondary | ICD-10-CM | POA: Diagnosis not present

## 2019-07-16 DIAGNOSIS — R161 Splenomegaly, not elsewhere classified: Secondary | ICD-10-CM | POA: Diagnosis not present

## 2019-07-16 DIAGNOSIS — D508 Other iron deficiency anemias: Secondary | ICD-10-CM

## 2019-07-16 DIAGNOSIS — D509 Iron deficiency anemia, unspecified: Secondary | ICD-10-CM | POA: Diagnosis not present

## 2019-07-16 MED ORDER — SODIUM CHLORIDE 0.9 % IV SOLN
510.0000 mg | Freq: Once | INTRAVENOUS | Status: AC
  Administered 2019-07-16: 510 mg via INTRAVENOUS
  Filled 2019-07-16: qty 17

## 2019-07-16 MED ORDER — SODIUM CHLORIDE 0.9 % IV SOLN
Freq: Once | INTRAVENOUS | Status: AC
  Filled 2019-07-16: qty 250

## 2019-07-16 NOTE — Patient Instructions (Signed)

## 2019-07-17 ENCOUNTER — Ambulatory Visit
Admission: RE | Admit: 2019-07-17 | Discharge: 2019-07-17 | Disposition: A | Discharge status: Home / Self Care | Payer: Medicare Other | Source: Ambulatory Visit | Attending: Adult Health | Admitting: Adult Health

## 2019-07-17 DIAGNOSIS — Z1231 Encounter for screening mammogram for malignant neoplasm of breast: Secondary | ICD-10-CM | POA: Diagnosis not present

## 2019-07-23 ENCOUNTER — Other Ambulatory Visit: Payer: Self-pay

## 2019-07-23 ENCOUNTER — Inpatient Hospital Stay: Payer: Medicare Other | Attending: Adult Health

## 2019-07-23 VITALS — BP 100/38 | HR 68 | Temp 98.2°F | Resp 18

## 2019-07-23 DIAGNOSIS — D509 Iron deficiency anemia, unspecified: Secondary | ICD-10-CM | POA: Diagnosis not present

## 2019-07-23 DIAGNOSIS — D508 Other iron deficiency anemias: Secondary | ICD-10-CM

## 2019-07-23 MED ORDER — SODIUM CHLORIDE 0.9 % IV SOLN
510.0000 mg | Freq: Once | INTRAVENOUS | Status: AC
  Administered 2019-07-23: 510 mg via INTRAVENOUS
  Filled 2019-07-23: qty 510

## 2019-07-23 MED ORDER — SODIUM CHLORIDE 0.9 % IV SOLN
Freq: Once | INTRAVENOUS | Status: AC
  Filled 2019-07-23: qty 250

## 2019-07-23 NOTE — Patient Instructions (Signed)

## 2019-07-29 ENCOUNTER — Other Ambulatory Visit: Payer: Self-pay

## 2019-07-29 ENCOUNTER — Emergency Department (HOSPITAL_COMMUNITY)
Admission: EM | Admit: 2019-07-29 | Discharge: 2019-07-29 | Disposition: A | Discharge status: Home / Self Care | Payer: Medicare Other | Attending: Emergency Medicine | Admitting: Emergency Medicine

## 2019-07-29 DIAGNOSIS — Z794 Long term (current) use of insulin: Secondary | ICD-10-CM | POA: Insufficient documentation

## 2019-07-29 DIAGNOSIS — K1379 Other lesions of oral mucosa: Secondary | ICD-10-CM | POA: Diagnosis present

## 2019-07-29 DIAGNOSIS — E119 Type 2 diabetes mellitus without complications: Secondary | ICD-10-CM | POA: Insufficient documentation

## 2019-07-29 DIAGNOSIS — K121 Other forms of stomatitis: Secondary | ICD-10-CM | POA: Diagnosis not present

## 2019-07-29 MED ORDER — LIDOCAINE VISCOUS HCL 2 % MT SOLN
15.0000 mL | OROMUCOSAL | 2 refills | Status: DC | PRN
Start: 2019-07-29 — End: 2021-11-24

## 2019-07-29 MED ORDER — VALACYCLOVIR HCL 1 G PO TABS
1000.0000 mg | ORAL_TABLET | Freq: Two times a day (BID) | ORAL | 0 refills | Status: AC
Start: 2019-07-29 — End: 2019-08-05

## 2019-07-29 NOTE — Discharge Instructions (Addendum)
    Lidocaine liquid: Use the viscous lidocaine for mouth pain. Swish with the lidocaine and spit it out. Do not swallow it. Acetaminophen: May take acetaminophen (generic for Tylenol), as needed, for pain. Your daily total maximum amount of acetaminophen from all sources should be limited to 4000mg /day for persons without liver problems, or 2000mg /day for those with liver problems. Valacyclovir: This antiviral medication should be taken twice daily for 7 days.  If this issue is from a viral cause, it should reduce the timing of illness. Follow-up: Should symptoms continue, he may follow-up with the ear nose and throat specialist. Return: Return to the emergency department for difficulty swallowing, painful swallowing, fever with her symptoms, drooling, or any other major concerns. For prescription assistance, may try using prescription discount sites or apps, such as goodrx.com
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
## 2019-07-29 NOTE — ED Triage Notes (Addendum)
Patient dropped off by husband.   C/o swelling in lips, tongue, and left side of face that started when she woke up yesterday morning.   Pt reports trying magic mouthwash with no relief.   Patient reports her last iron infusion last Monday.   Denies shob or trouble breathing. Patient able to speak in complete sentence with no issues.    A/Ox4 Uses walker to ED room.

## 2019-08-01 DIAGNOSIS — K1230 Oral mucositis (ulcerative), unspecified: Secondary | ICD-10-CM | POA: Diagnosis not present

## 2019-08-07 DIAGNOSIS — K1230 Oral mucositis (ulcerative), unspecified: Secondary | ICD-10-CM | POA: Diagnosis not present

## 2019-08-14 ENCOUNTER — Other Ambulatory Visit (INDEPENDENT_AMBULATORY_CARE_PROVIDER_SITE_OTHER): Payer: Medicare Other

## 2019-08-14 ENCOUNTER — Encounter: Payer: Self-pay | Admitting: Gastroenterology

## 2019-08-14 ENCOUNTER — Ambulatory Visit (INDEPENDENT_AMBULATORY_CARE_PROVIDER_SITE_OTHER): Payer: Medicare Other | Admitting: Gastroenterology

## 2019-08-14 VITALS — BP 122/62 | HR 65 | Ht 61.0 in | Wt 238.4 lb

## 2019-08-14 DIAGNOSIS — D509 Iron deficiency anemia, unspecified: Secondary | ICD-10-CM

## 2019-08-14 LAB — CBC
HCT: 33.8 % — ABNORMAL LOW (ref 36.0–46.0)
Hemoglobin: 11.1 g/dL — ABNORMAL LOW (ref 12.0–15.0)
MCHC: 32.8 g/dL (ref 30.0–36.0)
MCV: 80.1 fl (ref 78.0–100.0)
Platelets: 139 10*3/uL — ABNORMAL LOW (ref 150.0–400.0)
RBC: 4.23 Mil/uL (ref 3.87–5.11)
RDW: 25.6 % — ABNORMAL HIGH (ref 11.5–15.5)
WBC: 5.3 10*3/uL (ref 4.0–10.5)

## 2019-08-14 MED ORDER — SUPREP BOWEL PREP KIT 17.5-3.13-1.6 GM/177ML PO SOLN
1.0000 | ORAL | 0 refills | Status: DC
Start: 2019-08-14 — End: 2019-10-02
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
## 2019-08-14 NOTE — Patient Instructions (Addendum)
If you are age 67 or older, your body mass index should be between 23-30. Your Body mass index is 45.04 kg/m. If this is out of the aforementioned range listed, please consider follow up with your Primary Care Provider.  If you are age 15 or younger, your body mass index should be between 19-25. Your Body mass index is 45.04 kg/m. If this is out of the aformentioned range listed, please consider follow up with your Primary Care Provider.   You have been scheduled for an endoscopy and colonoscopy. Please follow the written instructions given to you at your visit today. Please pick up your prep supplies at the pharmacy within the next 1-3 days. If you use inhalers (even only as needed), please bring them with you on the day of your procedure.  Due to recent changes in healthcare laws, you may see the results of your imaging and laboratory studies on MyChart before your provider has had a chance to review them.  We understand that in some cases there may be results that are confusing or concerning to you. Not all laboratory results come back in the same time frame and the provider may be waiting for multiple results in order to interpret others.  Please give [place] 48 hours in order for your provider to thoroughly review all the results before contacting the office for clarification of your results.   Your provider has requested that you go to the basement level for lab work before leaving today. Press "B" on the elevator. The lab is located at the first door on the left as you exit the elevator.  Thank you for entrusting me with your care and choosing [facility].  Dr Ardis Hughs

## 2019-08-21 DIAGNOSIS — K1230 Oral mucositis (ulcerative), unspecified: Secondary | ICD-10-CM | POA: Diagnosis not present

## 2019-08-22 DIAGNOSIS — R519 Headache, unspecified: Secondary | ICD-10-CM | POA: Diagnosis not present

## 2019-08-22 DIAGNOSIS — I129 Hypertensive chronic kidney disease with stage 1 through stage 4 chronic kidney disease, or unspecified chronic kidney disease: Secondary | ICD-10-CM | POA: Diagnosis not present

## 2019-08-22 DIAGNOSIS — N1831 Chronic kidney disease, stage 3a: Secondary | ICD-10-CM | POA: Diagnosis not present

## 2019-08-22 DIAGNOSIS — G4733 Obstructive sleep apnea (adult) (pediatric): Secondary | ICD-10-CM | POA: Diagnosis not present

## 2019-08-27 DIAGNOSIS — E538 Deficiency of other specified B group vitamins: Secondary | ICD-10-CM | POA: Diagnosis not present

## 2019-08-27 DIAGNOSIS — R82998 Other abnormal findings in urine: Secondary | ICD-10-CM | POA: Diagnosis not present

## 2019-09-03 ENCOUNTER — Other Ambulatory Visit: Payer: Self-pay | Admitting: *Deleted

## 2019-09-03 DIAGNOSIS — D709 Neutropenia, unspecified: Secondary | ICD-10-CM

## 2019-09-03 DIAGNOSIS — D508 Other iron deficiency anemias: Secondary | ICD-10-CM

## 2019-09-04 ENCOUNTER — Inpatient Hospital Stay: Payer: Medicare Other | Admitting: Adult Health

## 2019-09-04 ENCOUNTER — Inpatient Hospital Stay: Payer: Medicare Other

## 2019-09-10 ENCOUNTER — Encounter: Payer: Self-pay | Admitting: Gastroenterology

## 2019-09-10 ENCOUNTER — Ambulatory Visit (AMBULATORY_SURGERY_CENTER): Payer: Medicare Other | Admitting: Gastroenterology

## 2019-09-10 ENCOUNTER — Other Ambulatory Visit: Payer: Self-pay

## 2019-09-10 VITALS — BP 81/36 | HR 58 | Temp 97.3°F | Resp 10 | Ht 61.0 in | Wt 238.0 lb

## 2019-09-10 DIAGNOSIS — D124 Benign neoplasm of descending colon: Secondary | ICD-10-CM | POA: Diagnosis not present

## 2019-09-10 DIAGNOSIS — D120 Benign neoplasm of cecum: Secondary | ICD-10-CM

## 2019-09-10 DIAGNOSIS — D509 Iron deficiency anemia, unspecified: Secondary | ICD-10-CM

## 2019-09-10 DIAGNOSIS — D61818 Other pancytopenia: Secondary | ICD-10-CM

## 2019-09-10 DIAGNOSIS — D123 Benign neoplasm of transverse colon: Secondary | ICD-10-CM

## 2019-09-10 MED ORDER — SODIUM CHLORIDE 0.9 % IV SOLN: 500.0000 mL | Freq: Once | INTRAVENOUS | Status: DC

## 2019-09-10 NOTE — Progress Notes (Signed)
Report to PACU, RN, vss, BBS= Clear.  

## 2019-09-10 NOTE — Progress Notes (Signed)
VS by JB  Pt's states no medical or surgical changes since previsit or office visit.

## 2019-09-10 NOTE — Patient Instructions (Signed)
HANDOUTS PROVIDED ON: POLYPS & DIVERTICULOSIS  The polyps removed/biopsies taken today have been sent for pathology.  The results can take 1-3 weeks to receive.  When your next colonoscopy should occur will be based on the pathology results.    You may resume your previous diet and medication schedule.  Thank you for allowing [place] to care for you today!!!   YOU HAD AN ENDOSCOPIC PROCEDURE TODAY AT [place]:   Refer to the procedure report that was given to you for any specific questions about what was found during the examination.  If the procedure report does not answer your questions, please call your gastroenterologist to clarify.  If you requested that your care partner not be given the details of your procedure findings, then the procedure report has been included in a sealed envelope for you to review at your convenience later.  YOU SHOULD EXPECT: Some feelings of bloating in the abdomen. Passage of more gas than usual.  Walking can help get rid of the air that was put into your GI tract during the procedure and reduce the bloating. If you had a lower endoscopy (such as a colonoscopy or flexible sigmoidoscopy) you may notice spotting of blood in your stool or on the toilet paper. If you underwent a bowel prep for your procedure, you may not have a normal bowel movement for a few days.  Please Note:  You might notice some irritation and congestion in your nose or some drainage.  This is from the oxygen used during your procedure.  There is no need for concern and it should clear up in a day or so.  SYMPTOMS TO REPORT IMMEDIATELY:   Following lower endoscopy (colonoscopy or flexible sigmoidoscopy):  Excessive amounts of blood in the stool  Significant tenderness or worsening of abdominal pains  Swelling of the abdomen that is new, acute  Fever of 100F or higher   Following upper endoscopy (EGD)  Vomiting of blood or coffee ground material  New chest pain or pain  under the shoulder blades  Painful or persistently difficult swallowing  New shortness of breath  Fever of 100F or higher  Black, tarry-looking stools  For urgent or emergent issues, a gastroenterologist can be reached at any hour by calling [phone]. Do not use MyChart messaging for urgent concerns.    DIET:  We do recommend a small meal at first, but then you may proceed to your regular diet.  Drink plenty of fluids but you should avoid alcoholic beverages for 24 hours.  ACTIVITY:  You should plan to take it easy for the rest of today and you should NOT DRIVE or use heavy machinery until tomorrow (because of the sedation medicines used during the test).    FOLLOW UP: Our staff will call the number listed on your records 48-72 hours following your procedure to check on you and address any questions or concerns that you may have regarding the information given to you following your procedure. If we do not reach you, we will leave a message.  We will attempt to reach you two times.  During this call, we will ask if you have developed any symptoms of COVID 19. If you develop any symptoms (ie: fever, flu-like symptoms, shortness of breath, cough etc.) before then, please call [phone].  If you test positive for Covid 19 in the 2 weeks post procedure, please call and report this information to [place].    If any biopsies were taken you  will be contacted by phone or by letter within the next 1-3 weeks.  Please call us at [phone] if you have not heard about the biopsies in 3 weeks.    SIGNATURES/CONFIDENTIALITY: You and/or your care partner have signed paperwork which will be entered into your electronic medical record.  These signatures attest to the fact that that the information above on your After Visit Summary has been reviewed and is understood.  Full responsibility of the confidentiality of this discharge information lies with you and/or your care-partner.

## 2019-09-10 NOTE — Op Note (Signed)
[facility] Patient Name: [name] Procedure Date: 09/10/2019 2:45 PM MRN: [id number] Endoscopist: Milus Banister , MD Age: 69 Referring MD:  Date of Birth: 04-Mar-1951 Gender: Female Account #: [id number] Procedure:                Colonoscopy Indications:              pancytopenia, including probably some contribution                            from IDA Medicines:                Monitored Anesthesia Care Procedure:                Pre-Anesthesia Assessment:                           - Prior to the procedure, a History and Physical                            was performed, and patient medications and                            allergies were reviewed. The patient's tolerance of                            previous anesthesia was also reviewed. The risks                            and benefits of the procedure and the sedation                            options and risks were discussed with the patient.                            All questions were answered, and informed consent                            was obtained. Prior Anticoagulants: The patient has                            taken no previous anticoagulant or antiplatelet                            agents. ASA Grade Assessment: III - A patient with                            severe systemic disease. After reviewing the risks                            and benefits, the patient was deemed in                            satisfactory condition to undergo the procedure.  After obtaining informed consent, the colonoscope                            was passed under direct vision. Throughout the                            procedure, the patient's blood pressure, pulse, and                            oxygen saturations were monitored continuously. The                            Colonoscope was introduced through the anus and                            advanced to the the cecum, identified by                             appendiceal orifice and ileocecal valve. The                            colonoscopy was performed without difficulty. The                            patient tolerated the procedure well. The quality                            of the bowel preparation was adequate. The                            ileocecal valve, appendiceal orifice, and rectum                            were photographed. Scope In: 2:48:05 PM Scope Out: 3:08:36 PM Scope Withdrawal Time: 0 hours 12 minutes 14 seconds  Total Procedure Duration: 0 hours 20 minutes 31 seconds  Findings:                 Five sessile polyps were found in the descending                            colon, transverse colon and cecum. The polyps were                            2 to 7 mm in size. These polyps were removed with a                            cold snare. Resection and retrieval were complete.                           A 12 mm polyp was found in the transverse colon.                            The polyp was semi-pedunculated.  The polyp was                            removed with a hot snare. Resection and retrieval                            were complete.                           Multiple small and large-mouthed diverticula were                            found in the left colon.                           The exam was otherwise without abnormality on                            direct and retroflexion views. Complications:            No immediate complications. Estimated blood loss:                            None. Estimated Blood Loss:     Estimated blood loss: none. Impression:               - Five 2 to 7 mm polyps in the descending colon, in                            the transverse colon and in the cecum, removed with                            a cold snare. Resected and retrieved.                           - One 12 mm polyp in the transverse colon, removed                            with a hot snare. Resected and  retrieved.                           - Diverticulosis in the left colon.                           - The examination was otherwise normal on direct                            and retroflexion views. Recommendation:           - EGD now.                           - Await pathology results. Milus Banister, MD 09/10/2019 3:17:40 PM This report has been signed electronically.

## 2019-09-10 NOTE — Progress Notes (Signed)
Called to room to assist during endoscopic procedure.  Patient ID and intended procedure confirmed with present staff. Received instructions for my participation in the procedure from the performing physician.  

## 2019-09-10 NOTE — Op Note (Signed)
[place] Patient Name: [name] Procedure Date: 09/10/2019 2:45 PM MRN: [id number] Endoscopist: Milus Banister , MD Age: 69 Referring MD:  Date of Birth: Feb 12, 1951 Gender: Female Account #: [id number] Procedure:                Upper GI endoscopy Indications:              pancytopenia with component of IDA, remote gastric                            sleeve bariatric procedure Medicines:                Monitored Anesthesia Care Procedure:                Pre-Anesthesia Assessment:                           - Prior to the procedure, a History and Physical                            was performed, and patient medications and                            allergies were reviewed. The patient's tolerance of                            previous anesthesia was also reviewed. The risks                            and benefits of the procedure and the sedation                            options and risks were discussed with the patient.                            All questions were answered, and informed consent                            was obtained. Prior Anticoagulants: The patient has                            taken no previous anticoagulant or antiplatelet                            agents. ASA Grade Assessment: III - A patient with                            severe systemic disease. After reviewing the risks                            and benefits, the patient was deemed in                            satisfactory condition to undergo the procedure.  After obtaining informed consent, the endoscope was                            passed under direct vision. Throughout the                            procedure, the patient's blood pressure, pulse, and                            oxygen saturations were monitored continuously. The                            Endoscope was introduced through the mouth, and                            advanced to the second  part of duodenum. The upper                            GI endoscopy was accomplished without difficulty.                            The patient tolerated the procedure well. Scope In: Scope Out: Findings:                 Asymptomatic thin Schatzki's ring.                           Typical anatomy s/p remote gastric sleeve operation.                           The examined duodenum was normal. Complications:            No immediate complications. Estimated blood loss:                            None. Estimated Blood Loss:     Estimated blood loss: none. Impression:               - Asymptomatic thin Schatzki's ring.                           - Typical anatomy s/p remote gastric sleeve                            operation. Recommendation:           - Patient has a contact number available for                            emergencies. The signs and symptoms of potential                            delayed complications were discussed with the                            patient. Return to normal activities tomorrow.  Written discharge instructions were provided to the                            patient.                           - Resume previous diet.                           - Continue present medications. Milus Banister, MD 09/10/2019 3:20:55 PM This report has been signed electronically.

## 2019-09-10 NOTE — Progress Notes (Signed)
BP cuff inaccurate d/t pt movement and  Positioning.  Other arm had small IV so meds would not run if switched.  Ran pt light and used BP readings as trends.  Dr Ardis Hughs aware

## 2019-09-12 ENCOUNTER — Telehealth: Payer: Self-pay

## 2019-09-12 NOTE — Telephone Encounter (Signed)
Second follow up call attempt, no answer, LM

## 2019-09-12 NOTE — Telephone Encounter (Signed)
First post procedure follow up call, no answer 

## 2019-09-17 ENCOUNTER — Encounter: Payer: Self-pay | Admitting: Gastroenterology

## 2019-09-26 NOTE — Telephone Encounter (Signed)
No entry 

## 2019-10-02 ENCOUNTER — Other Ambulatory Visit: Payer: Self-pay

## 2019-10-02 ENCOUNTER — Inpatient Hospital Stay: Payer: Medicare Other | Attending: Adult Health

## 2019-10-02 ENCOUNTER — Inpatient Hospital Stay (HOSPITAL_BASED_OUTPATIENT_CLINIC_OR_DEPARTMENT_OTHER): Payer: Medicare Other | Admitting: Adult Health

## 2019-10-02 ENCOUNTER — Encounter: Payer: Self-pay | Admitting: Adult Health

## 2019-10-02 ENCOUNTER — Telehealth: Payer: Self-pay | Admitting: Adult Health

## 2019-10-02 VITALS — BP 134/52 | HR 62 | Temp 98.5°F | Resp 17 | Ht 61.0 in | Wt 238.9 lb

## 2019-10-02 DIAGNOSIS — D508 Other iron deficiency anemias: Secondary | ICD-10-CM

## 2019-10-02 DIAGNOSIS — D709 Neutropenia, unspecified: Secondary | ICD-10-CM

## 2019-10-02 DIAGNOSIS — K862 Cyst of pancreas: Secondary | ICD-10-CM | POA: Diagnosis not present

## 2019-10-02 DIAGNOSIS — R161 Splenomegaly, not elsewhere classified: Secondary | ICD-10-CM | POA: Insufficient documentation

## 2019-10-02 DIAGNOSIS — Z79899 Other long term (current) drug therapy: Secondary | ICD-10-CM | POA: Insufficient documentation

## 2019-10-02 DIAGNOSIS — D509 Iron deficiency anemia, unspecified: Secondary | ICD-10-CM | POA: Diagnosis present

## 2019-10-02 LAB — CBC WITH DIFFERENTIAL (CANCER CENTER ONLY)
Abs Immature Granulocytes: 0 10*3/uL (ref 0.00–0.07)
Basophils Absolute: 0 10*3/uL (ref 0.0–0.1)
Basophils Relative: 0 %
Eosinophils Absolute: 0 10*3/uL (ref 0.0–0.5)
Eosinophils Relative: 0 %
HCT: 34.7 % — ABNORMAL LOW (ref 36.0–46.0)
Hemoglobin: 11.3 g/dL — ABNORMAL LOW (ref 12.0–15.0)
Immature Granulocytes: 0 %
Lymphocytes Relative: 40 %
Lymphs Abs: 0.8 10*3/uL (ref 0.7–4.0)
MCH: 27.6 pg (ref 26.0–34.0)
MCHC: 32.6 g/dL (ref 30.0–36.0)
MCV: 84.8 fL (ref 80.0–100.0)
Monocytes Absolute: 0.2 10*3/uL (ref 0.1–1.0)
Monocytes Relative: 9 %
Neutro Abs: 1 10*3/uL — ABNORMAL LOW (ref 1.7–7.7)
Neutrophils Relative %: 51 %
Platelet Count: 127 10*3/uL — ABNORMAL LOW (ref 150–400)
RBC: 4.09 MIL/uL (ref 3.87–5.11)
RDW: 17.1 % — ABNORMAL HIGH (ref 11.5–15.5)
WBC Count: 2 10*3/uL — ABNORMAL LOW (ref 4.0–10.5)
nRBC: 0 % (ref 0.0–0.2)

## 2019-10-02 LAB — CMP (CANCER CENTER ONLY)
ALT: 22 U/L (ref 0–44)
AST: 25 U/L (ref 15–41)
Albumin: 3.9 g/dL (ref 3.5–5.0)
Alkaline Phosphatase: 90 U/L (ref 38–126)
Anion gap: 11 (ref 5–15)
BUN: 26 mg/dL — ABNORMAL HIGH (ref 8–23)
CO2: 26 mmol/L (ref 22–32)
Calcium: 10.1 mg/dL (ref 8.9–10.3)
Chloride: 104 mmol/L (ref 98–111)
Creatinine: 1.17 mg/dL — ABNORMAL HIGH (ref 0.44–1.00)
GFR, Est AFR Am: 56 mL/min — ABNORMAL LOW (ref >60)
GFR, Estimated: 48 mL/min — ABNORMAL LOW (ref >60)
Glucose, Bld: 189 mg/dL — ABNORMAL HIGH (ref 70–99)
Potassium: 4.7 mmol/L (ref 3.5–5.1)
Sodium: 141 mmol/L (ref 135–145)
Total Bilirubin: 0.4 mg/dL (ref 0.3–1.2)
Total Protein: 7.3 g/dL (ref 6.5–8.1)

## 2019-10-02 LAB — RETICULOCYTES
Immature Retic Fract: 16.7 % — ABNORMAL HIGH (ref 2.3–15.9)
RBC.: 4.06 MIL/uL (ref 3.87–5.11)
Retic Count, Absolute: 89.3 10*3/uL (ref 19.0–186.0)
Retic Ct Pct: 2.2 % (ref 0.4–3.1)

## 2019-10-02 LAB — FERRITIN: Ferritin: 44 ng/mL (ref 11–307)

## 2019-10-02 LAB — IRON AND TIBC
Iron: 64 ug/dL (ref 41–142)
Saturation Ratios: 19 % — ABNORMAL LOW (ref 21–57)
TIBC: 333 ug/dL (ref 236–444)
UIBC: 268 ug/dL (ref 120–384)

## 2019-10-02 LAB — SAVE SMEAR(SSMR), FOR PROVIDER SLIDE REVIEW

## 2019-10-02 NOTE — Progress Notes (Signed)
[facility]  [phone] [phone]     ID: [name] DOB: 05/09/1950  MR#: [id number]  HAL#:[id number]  Patient Care Team: Crist Infante, MD as PCP - General (Internal Medicine) Raynelle Bring, MD as Consulting Physician (Urology) Milus Banister, MD as Attending Physician (Gastroenterology) Michel Bickers, MD as Consulting Physician (Infectious Diseases) Magrinat, Virgie Dad, MD as Consulting Physician (Oncology) Scot Dock, NP OTHER MD:  CHIEF COMPLAINT: pancytopenia  CURRENT TREATMENT: IV iron when needed; observation   HISTORY OF CURRENT ILLNESS:  [name] is being seen today at the request of Dr. Joylene Draft for her pancytopenia.  She sought an urgent appointment with him back in 05/2019 due to falling asleep easily.  This revealed that she had decreased WBC, HGB, and Plt counts.  Her WBC was 2.2 with an ANC of 1.1; Her hemoglobin 9 with an MCV of 81.4; and her platelet count of 138.  Her creatinine at that time was 1.1.  In 09/2018 her CBC was similar, however that was following/during a MRSA abscess hospitalization and recovery time period.  In reviewing her available labs in Epic, she appears to have a longstanding anemia, more recently microcytic see below.  She is taking b12 supplementation for b12 deficiency.   Ref. Range 12/18/2007 09:33 12/27/2007 03:40 08/15/2012 11:51 12/10/2013 09:00 07/21/2014 07:00 06/25/2015 11:45 01/06/2017 13:15 07/13/2017 10:40 10/11/2018 16:09 07/10/2019 08:49  Hemoglobin Latest Ref Range: 12.0 - 15.0 g/dL 12.3 11.4 (L) 10.9 (L) 9.9 (L) 10.9 (L) 9.9 (L) 9.2 (L) 11.7 (L) 9.6 (L) 8.3 (L)    Ref. Range 12/18/2007 09:33 12/27/2007 03:40 08/15/2012 11:51 12/10/2013 09:00 07/21/2014 07:00 06/25/2015 11:45 01/06/2017 13:15 07/13/2017 10:40 10/11/2018 16:09 07/10/2019 08:49  MCV Latest Ref Range: 80.0 - 100.0 fL 86.3 86.4 86.2 87.8 88.3 86.1 83.1 85.0 79.4 (L) 77.3 (L)   Her WBC and platelets have been as follows:    Ref. Range 12/18/2007  09:33 12/27/2007 03:40 08/15/2012 11:51 12/10/2013 09:00 07/21/2014 07:00 06/25/2015 11:45 01/06/2017 13:15 07/13/2017 10:40 10/11/2018 16:09 07/10/2019 08:49  WBC Latest Ref Range: 4.0 - 10.5 K/uL 4.2 6.7 4.1 5.6 3.5 (L) 9.7 4.2 2.7 (L) 2.1 (L) 2.3 (L)    Ref. Range 12/18/2007 09:33 12/27/2007 03:40 08/15/2012 11:51 12/10/2013 09:00 07/21/2014 07:00 06/25/2015 11:45 01/06/2017 13:15 07/13/2017 10:40 10/11/2018 16:09 07/10/2019 08:49  Platelets Latest Ref Range: 150 - 400 K/uL 202 197 163 194 144 (L) 156 257 158 173 139 (L)   The patient's subsequent history is as detailed below.  INTERVAL HISTORY: [name] notes she is feeling moderately well today.  Since her last visit, she has received two doses of IV iron and has responded quite well to these.  She tolerated the feraheme well and without any issues.  She has not noticed a significant improvement in her energy level.    [name] also underwent colonoscopy and upper endoscopy.  This reveled 5 polyps and she was recommended a repeat colonoscopy in 3 years.  She also was noted to have diverticulosis.    REVIEW OF SYSTEMS: [name] says her biggest issue is her left knee.  She struggles with this knee because she needs a replacement, but she needs to lose weight before that happens.  She is drinking slim fast.  She notes that she is going to see flexogenics later this week for an injection to help improve her mobility so she can lose weight to have the knee replaced.  She denies any fever, chills, chest pain, palpitations, cough, bowel/bladder changes, headaches, vision  issues, or any other concerns.  A detailed ROS was otherwise non contributory.    PAST MEDICAL HISTORY: Past Medical History:  Diagnosis Date  . Anemia   . Arthritis   . Asthma    OCCAS  . Bell's palsy   . Broken neck ([place])    C-1  . Carbuncle and furuncle of trunk   . Chronic dislocation of right shoulder   . Depression   . Disorder of fascia    HX OF NECROTIC FASCITIS AFTER ABDOMINAL SURGERY  FOR HERNIA- REQUIRED 19 SURGERIES AND 2.5 MONTH HOSPITALIZATION AT [facility]  . DM (diabetes mellitus) ([place])   . Dysrhythmia    HX OF TACHYCARDIA AND BRADYCARDIA - ON GOING FOR YEARS - DOES NOT HAVE TO SEE CARDIOLOGIST  . Elevated cholesterol   . Fibromyalgia   . Fracture MAY 2014   HX OF FRACTURED NECK C1- CAUSES SEVERE HEADACHES--LIMITED ROM NECK  . Frequent infections    ESPECIALLY PRONE TO INFECTIONS AFTER SURGERIES  . Graves disease   . Heart murmur    DOES NOT CAUSE ANY PROBLEMS  . History of kidney stones   . Hyperlipidemia   . Hypertension   . Hypothyroidism    GRAVES DISEASE  . Migraine   . 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 Neuropathy   . Obesity   . OSA (obstructive sleep apnea)    USES CPAP - DOES NOT KNOW SETTING  . Pain    LEFT SHOULDER  PAIN -HARD TO LIE ON LEFT SIDE FOR LONG PERIOD;  PAIN IN LOWER BADK - 3 HERNIATED DISCS AND STENOISIS -  . PONV (postoperative nausea and vomiting)    THE GAS MAKES ME NAUSEATED  . Restless leg syndrome   . Sciatica   . Tachycardia   . Urinary frequency   . Urticaria   . UTI (urinary tract infection)     PAST SURGICAL HISTORY: Past Surgical History:  Procedure Laterality Date  . ABDOMINAL HYSTERECTOMY     LARGE TUMOR AT OVARY REMOVED  . ANTERIOR LAT LUMBAR FUSION N/A 07/28/2018   Procedure: Anterolateral Decompression Lumbar One-Two for osteomyelitis reconstruction w/titanium strut allograft Fusion Lumbar One-Two;  Surgeon: Kristeen Miss, MD;  Location: [facility];  Service: Neurosurgery;  Laterality: N/A;  Left anterolateral approach  . APPENDECTOMY    . APPLICATION OF ROBOTIC ASSISTANCE FOR SPINAL PROCEDURE N/A 07/28/2018   Procedure: APPLICATION OF ROBOTIC ASSISTANCE FOR SPINAL PROCEDURE;  Surgeon: Kristeen Miss, MD;  Location: [facility];  Service: Neurosurgery;  Laterality: N/A;  . CARPAL TUNNEL RELEASE     BILATERAL  . CATARACT EXTRACTION W/ INTRAOCULAR LENS  IMPLANT, BILATERAL    . CESAREAN SECTION       X 3  . CHOLECYSTECTOMY    . CYSTOSCOPY WITH URETEROSCOPY Right 12/03/2013   Procedure: CYSTOSCOPY WITH RIGHT RETROGRADE URETEROSCOPY LASER LITHOTRIPSY RIGHT STONE RIGHT URETERAL STENT, ;  Surgeon: Raynelle Bring, MD;  Location: [facility];  Service: Urology;  Laterality: Right;  PROCEDURE WAS ORIGINALLY SCHEDULED AS RIGHT PERCUTANEOUS NEPHROLITHOTOMY  . EYELID LACERATION REPAIR     RIGHT EYE  . HERNIA REPAIR     ABDOMINAL HERNIA REPAIR WITH MESH - 3 SURGERIES   . HX OF 19 SURGERIES FOR NECROTIC FASCITIS     SKIN GRAFTS+ WOUND  VAC  . I&D OF INFECTED SITE IN [place] - FROM AN INJECTION    . IR FLUORO GUIDED NEEDLE PLC ASPIRATION/INJECTION LOC  06/03/2018  . JOINT  REPLACEMENT     TOTAL RIGHT KNEE REPLACEMENT  . KNEE ARTHROSCOPY  RIGHT AND LEFT   X 2  . [place] RESECTION  2014  . NEPHROLITHOTOMY Right 12/10/2013   Procedure: NEPHROLITHOTOMY PERCUTANEOUS;  Surgeon: Raynelle Bring, MD;  Location: [facility];  Service: Urology;  Laterality: Right;  . POSTERIOR LUMBAR FUSION 4 LEVEL N/A 07/28/2018   Procedure: Posterior Fixation Thoracic Ten-Lumbar Four with pedicle augmentaion with robotic assistance;  Surgeon: Kristeen Miss, MD;  Location: [facility];  Service: Neurosurgery;  Laterality: N/A;  Posterior Fixation Thoracic Ten-Lumbar Four with pedicle augmentaion with robotic assistance  . REVERSE SHOULDER ARTHROPLASTY Right 07/21/2017  . REVERSE SHOULDER ARTHROPLASTY Right 07/21/2017   Procedure: RIGHT REVERSE SHOULDER ARTHROPLASTY;  Surgeon: Tania Ade, MD;  Location: [facility];  Service: Orthopedics;  Laterality: Right;  . RIGHT FOOT DRAINAGE OF INFECTION    . shoulder arthroscopy Right    X 2  . TONSILLECTOMY     AND ADENOIDECTOMY    FAMILY HISTORY Family History  Problem Relation Age of Onset  . Diabetes Father   . Osteoarthritis Father   . Heart disease Father   . Ulcers Father   . Stroke Mother   . Bladder Cancer Mother   . Suicidality Sister   . Colon cancer Neg Hx   .  Stomach cancer Neg Hx   . Rectal cancer Neg Hx   . Esophageal cancer Neg Hx     GYNECOLOGIC HISTORY:  No LMP recorded. Patient has had a hysterectomy. Menarche: 69 years old Age at first live birth: 69 years old GX P 3 LMP 40 years ago Contraceptive1 year OCP HRT 5 years Hysterectomy? At age 53, still has cervix Salpingo-oophorectomy? At age 89, bilaterally    SOCIAL HISTORY: [name] is married and lives with her husband and her son in [place], [place].  She is retired, however due to her multiple health issues has been on disability as well.  She has two other sons who also live in [place], [place]: not in place   HEALTH MAINTENANCE: Social History   Tobacco Use  . Smoking status: Never Smoker  . Smokeless tobacco: Never Used  Vaping Use  . Vaping Use: Never used  Substance Use Topics  . Alcohol use: No  . Drug use: No     Colonoscopy: 11 years ago  PAP: many years ago, says still has cervix  Bone density: unknown  Mammogram: many years ago, cant recall when   Allergies  Allergen Reactions  . Imitrex [Sumatriptan] Other (See Comments)    Vascular spasms  . Nsaids Other (See Comments)    PT UNABLE TO TOLERATE NSAID'S DRUGS DUE TO HX OF GASTRIC SLEEVE SURGERY  . Rosuvastatin Calcium Other (See Comments)    Leg/muscle pain  . Statins Other (See Comments)    Leg/muscle pain  . Sulfonamide Derivatives Hives    Childhood allergy   . Codeine Nausea Only  . Promethazine Hcl Other (See Comments)    Restless leg feeling all over body  . Vicodin [Hydrocodone-Acetaminophen] Nausea And Vomiting    Current Outpatient Medications  Medication Sig Dispense Refill  . amLODipine (NORVASC) 5 MG tablet Take 5 mg by mouth daily.    . ARIPiprazole (ABILIFY) 5 MG tablet Take 1 tablet (5 mg total) by mouth daily. 30 tablet 1  . BD INSULIN SYRINGE U/F 31G X 5/16" 0.5 ML MISC USE 5 TIMES A DAY LEVEMIR AND NOVOLOG    .  BD PEN NEEDLE MICRO U/F 32G X 6 MM  MISC Inject 32 pens into the skin daily.    . Cholecalciferol (VITAMIN D3 PO) Take 1 tablet by mouth daily.     . clonazePAM (KLONOPIN) 0.5 MG tablet Take 0.5 mg by mouth at bedtime as needed for anxiety. Up to 3 times daily as needed for anxiety, take 1 tablet (0.5 mg) scheduled each night at bedtime.  0  . Cyanocobalamin (VITAMIN B 12 PO) Take 1 tablet by mouth daily.     . cyclobenzaprine (FLEXERIL) 10 MG tablet Take 10 mg by mouth See admin instructions. Take 1 tablet (10 mg) every night at bedtime, may take an additional tablet during the day as needed for muscle spasms  0  . diclofenac Sodium (VOLTAREN) 1 % GEL Apply 1 g topically as needed.    . docusate sodium (COLACE) 100 MG capsule Take 1 capsule (100 mg total) by mouth 2 (two) times daily. 30 capsule 0  . doxazosin (CARDURA) 2 MG tablet     . doxycycline (VIBRA-TABS) 100 MG tablet Take 1 tablet (100 mg total) by mouth 2 (two) times daily. 60 tablet 11  . fenofibrate 54 MG tablet Take 54 mg by mouth daily with supper.  2  . fluconazole (DIFLUCAN) 150 MG tablet Take 1 tablet by mouth daily for 3 days then once weekly. 6 tablet 2  . FLUoxetine (PROZAC) 20 MG capsule Take 60 mg by mouth at bedtime.   3  . FREESTYLE TEST STRIPS test strip USE STRIPS TO TEST BLOOD GLUCOSE BID    . furosemide (LASIX) 20 MG tablet TK 2 TS PO QAM AND 1 T QPM    . gabapentin (NEURONTIN) 100 MG capsule     . insulin aspart (NOVOLOG) 100 UNIT/ML FlexPen Sliding scale dose: CBG < 70: implement hypoglycemia protocol  CBG 70 - 120: 0 units  CBG 121 - 150: 0 units  CBG 151 - 200: 0 units  CBG 201 - 250: 2 units  CBG 251 - 300: 3 units  CBG 301 - 350: 4 units  CBG 351 - 400: 5 units 15 mL 0  . insulin glargine (LANTUS) 100 UNIT/ML injection Inject 0.2 mLs (20 Units total) into the skin 2 (two) times daily. 10 mL 0  . Lancets Misc. (UNISTIK 3 COMFORT) MISC USE LANCETS TO CHECK BLOOD GLUCOSE BID    . lidocaine (XYLOCAINE) 2 % solution Use as directed 15 mLs in the  mouth or throat as needed for mouth pain. 100 mL 2  . methocarbamol (ROBAXIN) 500 MG tablet Take 1 tablet (500 mg total) by mouth every 6 (six) hours as needed for muscle spasms. 20 tablet 0  . metoprolol tartrate (LOPRESSOR) 50 MG tablet     . Na Sulfate-K Sulfate-Mg Sulf (SUPREP BOWEL PREP KIT) 17.5-3.13-1.6 GM/177ML SOLN Take 1 kit by mouth as directed. 324 mL 0  . omeprazole (PRILOSEC) 20 MG capsule Take 20 mg by mouth daily with supper.     . ondansetron (ZOFRAN) 4 MG tablet Take 2 tablets (8 mg total) by mouth every 8 (eight) hours as needed for refractory nausea / vomiting. For nausea/vomiting prevention with pain medication. 30 tablet 2  . oxybutynin (DITROPAN-XL) 10 MG 24 hr tablet Take 10 mg by mouth daily.    Marland Kitchen PREVIDENT 5000 DRY MOUTH 1.1 % GEL dental gel Take 1 application by mouth daily.     Marland Kitchen propylthiouracil (PTU) 50 MG tablet Take 75 mg by mouth daily.  0  . QUEtiapine (SEROQUEL) 50 MG tablet Take 50 mg by mouth at bedtime.    Marland Kitchen rOPINIRole (REQUIP) 2 MG tablet Take 3 mg by mouth at bedtime.     . sodium phosphate (FLEET) 7-19 GM/118ML ENEM Place 133 mLs (1 enema total) rectally once as needed for severe constipation. 1 enema 0  . tiZANidine (ZANAFLEX) 4 MG tablet Take 4 mg by mouth 2 (two) times daily.    Marland Kitchen gabapentin (NEURONTIN) 300 MG capsule Take 1 capsule (300 mg total) by mouth 3 (three) times daily for 30 days. 90 capsule 0  . metoprolol tartrate (LOPRESSOR) 25 MG tablet Take 0.5 tablets (12.5 mg total) by mouth 3 (three) times daily for 30 days. 45 tablet 0   No current facility-administered medications for this visit.    OBJECTIVE:  Vitals:   10/02/19 1148  BP: (!) 134/52  Pulse: 62  Resp: 17  Temp: 98.5 F (36.9 C)  SpO2: 98%     Body mass index is 45.14 kg/m.   Wt Readings from Last 3 Encounters:  10/02/19 238 lb 14.4 oz (108.4 kg)  09/10/19 238 lb (108 kg)  08/14/19 238 lb 6 oz (108.1 kg)  ECOG FS:2 - Symptomatic, <50% confined to bed  GENERAL:  Patient is a chronically ill appearing morbidly obese woman, examined in chair HEENT:  Sclerae anicteric. Mask in place. Neck is supple.  NODES:  No cervical, supraclavicular, or axillary lymphadenopathy palpated.  LUNGS:  Clear to auscultation bilaterally.  No wheezes or rhonchi. HEART:  Regular rate and rhythm. No murmur appreciated. ABDOMEN:  Soft, nontender.  Positive, normoactive bowel sounds.  MSK:  No focal spinal tenderness to palpation.  EXTREMITIES:  No peripheral edema.   SKIN:  Clear with no obvious rashes or skin changes. NEURO:  Nonfocal. Well oriented.  Appropriate affect.    LAB RESULTS:  CMP     Component Value Date/Time   NA 140 07/10/2019 0849   K 4.7 07/10/2019 0849   CL 103 07/10/2019 0849   CO2 28 07/10/2019 0849   GLUCOSE 122 (H) 07/10/2019 0849   BUN 34 (H) 07/10/2019 0849   CREATININE 1.31 (H) 07/10/2019 0849   CREATININE 0.85 10/11/2018 1609   CALCIUM 9.7 07/10/2019 0849   PROT 7.5 07/10/2019 0849   ALBUMIN 3.8 07/10/2019 0849   AST 22 07/10/2019 0849   ALT 17 07/10/2019 0849   ALKPHOS 98 07/10/2019 0849   BILITOT 0.3 07/10/2019 0849   GFRNONAA 42 (L) 07/10/2019 0849   GFRAA 49 (L) 07/10/2019 0849    No results found for: TOTALPROTELP, ALBUMINELP, A1GS, A2GS, BETS, BETA2SER, GAMS, MSPIKE, SPEI  No results found for: KPAFRELGTCHN, LAMBDASER, KAPLAMBRATIO  Lab Results  Component Value Date   WBC 2.0 (L) 10/02/2019   NEUTROABS 1.0 (L) 10/02/2019   HGB 11.3 (L) 10/02/2019   HCT 34.7 (L) 10/02/2019   MCV 84.8 10/02/2019   PLT 127 (L) 10/02/2019      Chemistry      Component Value Date/Time   NA 140 07/10/2019 0849   K 4.7 07/10/2019 0849   CL 103 07/10/2019 0849   CO2 28 07/10/2019 0849   BUN 34 (H) 07/10/2019 0849   CREATININE 1.31 (H) 07/10/2019 0849   CREATININE 0.85 10/11/2018 1609      Component Value Date/Time   CALCIUM 9.7 07/10/2019 0849   ALKPHOS 98 07/10/2019 0849   AST 22 07/10/2019 0849   ALT 17 07/10/2019 0849    BILITOT 0.3 07/10/2019 0849  No results found for: LABCA2  No components found for: [id number]  No results for input(s): INR in the last 168 hours.  No results found for: LABCA2  No results found for: UKG254  No results found for: YHC623  No results found for: JSE831  No results found for: CA2729  No components found for: HGQUANT  No results found for: CEA1 / No results found for: CEA1   No results found for: AFPTUMOR  No results found for: CHROMOGRNA  No results found for: PSA1  Appointment on 10/02/2019  Component Date Value Ref Range Status  . WBC Count 10/02/2019 2.0* 4.0 - 10.5 K/uL Final  . RBC 10/02/2019 4.09  3.87 - 5.11 MIL/uL Final  . Hemoglobin 10/02/2019 11.3* 12.0 - 15.0 g/dL Final  . HCT 10/02/2019 34.7* 36 - 46 % Final  . MCV 10/02/2019 84.8  80.0 - 100.0 fL Final  . MCH 10/02/2019 27.6  26.0 - 34.0 pg Final  . MCHC 10/02/2019 32.6  30.0 - 36.0 g/dL Final  . RDW 10/02/2019 17.1* 11.5 - 15.5 % Final  . Platelet Count 10/02/2019 127* 150 - 400 K/uL Final  . nRBC 10/02/2019 0.0  0.0 - 0.2 % Final  . Neutrophils Relative % 10/02/2019 51  % Final  . Neutro Abs 10/02/2019 1.0* 1.7 - 7.7 K/uL Final  . Lymphocytes Relative 10/02/2019 40  % Final  . Lymphs Abs 10/02/2019 0.8  0.7 - 4.0 K/uL Final  . Monocytes Relative 10/02/2019 9  % Final  . Monocytes Absolute 10/02/2019 0.2  0 - 1 K/uL Final  . Eosinophils Relative 10/02/2019 0  % Final  . Eosinophils Absolute 10/02/2019 0.0  0 - 0 K/uL Final  . Basophils Relative 10/02/2019 0  % Final  . Basophils Absolute 10/02/2019 0.0  0 - 0 K/uL Final  . Immature Granulocytes 10/02/2019 0  % Final  . Abs Immature Granulocytes 10/02/2019 0.00  0.00 - 0.07 K/uL Final   Performed at [facility], [place] [address], [place], [place] [address]  . Smear Review 10/02/2019 SMEAR STAINED AND AVAILABLE FOR REVIEW   Final   Performed at [facility], [address],  [place], [place] [address]  . Retic Ct Pct 10/02/2019 2.2  0.4 - 3.1 % Final  . RBC. 10/02/2019 4.06  3.87 - 5.11 MIL/uL Final  . Retic Count, Absolute 10/02/2019 89.3  19.0 - 186.0 K/uL Final  . Immature Retic Fract 10/02/2019 16.7* 2.3 - 15.9 % Final   Performed at [facility], [place] [address] [place],  [address]    (this displays the last labs from the last 3 days)  No results found for: TOTALPROTELP, ALBUMINELP, A1GS, A2GS, BETS, BETA2SER, GAMS, MSPIKE, SPEI (this displays SPEP labs)  No results found for: KPAFRELGTCHN, LAMBDASER, KAPLAMBRATIO (kappa/lambda light chains)  No results found for: HGBA, HGBA2QUANT, HGBFQUANT, HGBSQUAN (Hemoglobinopathy evaluation)   No results found for: LDH  Lab Results  Component Value Date   IRON 26 (L) 07/10/2019   TIBC 459 (H) 07/10/2019   IRONPCTSAT 6 (L) 07/10/2019   (Iron and TIBC)  Lab Results  Component Value Date   FERRITIN 9 (L) 07/10/2019    Urinalysis    Component Value Date/Time   COLORURINE YELLOW 07/14/2018 0914   APPEARANCEUR CLEAR 07/14/2018 0914   LABSPEC 1.013 07/14/2018 0914   PHURINE 6.0 07/14/2018 0914   GLUCOSEU NEGATIVE 07/14/2018 0914   HGBUR NEGATIVE 07/14/2018 0914   BILIRUBINUR NEGATIVE 07/14/2018  0914   KETONESUR 5 (A) 07/14/2018 0914   PROTEINUR NEGATIVE 07/14/2018 0914   UROBILINOGEN 0.2 07/20/2014 1707   NITRITE NEGATIVE 07/14/2018 0914   LEUKOCYTESUR NEGATIVE 07/14/2018 0914     STUDIES: No results found.   ASSESSMENT: 69 y.o. [place] woman with multiple comorbidities referred to hematology for evaluation of pancytopenia.    1. Microcytic Anemia  (a) Iron deficiency--Feraheme given on 4/26 and 5/10  (b) B12 and folate on 07/10/2019 were normal  2. Neutropenia and thrombocytopenia  (a) Inflammatory markers in 06/2019 normal  (b) likely secondary to splenomegaly noted on recent MRCP   3. Incidental Pancreatic Cyst noted 05/2019  (a) Followed by Dr.  Ardis Hughs  (b) stable on most recent MRI 06/2019  (c) Due for repeat imaging in 06/2021  PLAN:  Azaylia is doing well today.  Her hemoglobin and iron studies have improved since receiving her feraheme.  Her GI work up was negative, and she is following with Dr. Ardis Hughs.    She has continued splenomegaly, which is likely the cause of her decreased WBC and plt count.  We will see her every 3 months to monitor her counts and determine if she needs any further intervention.  At this point, since they are stable and have no clinical significance, she does not need further evaluation.    If her labs continue to decline, then we may need to pursue bone marrow biopsy or further tetsing.   [name] understands this.  She will return in 3 months for labs and f/u.  She knows to call for any questions that may arise between now and her next appointment.  We are happy to see her sooner if needed.  Total encounter time: 20 minutes  Wilber Bihari, NP 10/02/19 11:54 AM Medical Oncology and Hematology [facility] [place],  [address] Tel. [phone]    Fax. [phone]   *Total Encounter Time as defined by the Centers for Medicare and Medicaid Services includes, in addition to the face-to-face time of a patient visit (documented in the note above) non-face-to-face time: obtaining and reviewing outside history, ordering and reviewing medications, tests or procedures, care coordination (communications with other health care professionals or caregivers) and documentation in the medical record.

## 2019-10-02 NOTE — Telephone Encounter (Signed)
Scheduled appts per 7/13 los. Gave pt a print out of AVS.  °

## 2019-10-03 DIAGNOSIS — M1712 Unilateral primary osteoarthritis, left knee: Secondary | ICD-10-CM | POA: Diagnosis not present

## 2019-10-03 DIAGNOSIS — M25562 Pain in left knee: Secondary | ICD-10-CM | POA: Diagnosis not present

## 2019-10-10 DIAGNOSIS — M1712 Unilateral primary osteoarthritis, left knee: Secondary | ICD-10-CM | POA: Diagnosis not present

## 2019-10-10 DIAGNOSIS — M25562 Pain in left knee: Secondary | ICD-10-CM | POA: Diagnosis not present

## 2019-10-17 DIAGNOSIS — M25562 Pain in left knee: Secondary | ICD-10-CM | POA: Diagnosis not present

## 2019-10-17 DIAGNOSIS — M1712 Unilateral primary osteoarthritis, left knee: Secondary | ICD-10-CM | POA: Diagnosis not present

## 2019-10-24 DIAGNOSIS — M1712 Unilateral primary osteoarthritis, left knee: Secondary | ICD-10-CM | POA: Diagnosis not present

## 2019-10-24 DIAGNOSIS — M25562 Pain in left knee: Secondary | ICD-10-CM | POA: Diagnosis not present

## 2019-10-26 ENCOUNTER — Other Ambulatory Visit: Payer: Self-pay | Admitting: Infectious Disease

## 2019-10-26 DIAGNOSIS — M4646 Discitis, unspecified, lumbar region: Secondary | ICD-10-CM

## 2019-10-28 ENCOUNTER — Encounter: Payer: Self-pay | Admitting: Neurology

## 2019-10-29 ENCOUNTER — Encounter: Payer: Self-pay | Admitting: Neurology

## 2019-10-29 ENCOUNTER — Ambulatory Visit (INDEPENDENT_AMBULATORY_CARE_PROVIDER_SITE_OTHER): Payer: Medicare Other | Admitting: Neurology

## 2019-10-29 VITALS — BP 139/59 | HR 66 | Ht 61.0 in | Wt 237.0 lb

## 2019-10-29 DIAGNOSIS — Z9989 Dependence on other enabling machines and devices: Secondary | ICD-10-CM | POA: Diagnosis not present

## 2019-10-29 DIAGNOSIS — G061 Intraspinal abscess and granuloma: Secondary | ICD-10-CM

## 2019-10-29 DIAGNOSIS — Z6841 Body Mass Index (BMI) 40.0 and over, adult: Secondary | ICD-10-CM | POA: Diagnosis not present

## 2019-10-29 DIAGNOSIS — R0601 Orthopnea: Secondary | ICD-10-CM | POA: Diagnosis not present

## 2019-10-29 DIAGNOSIS — J869 Pyothorax without fistula: Secondary | ICD-10-CM | POA: Diagnosis not present

## 2019-10-29 DIAGNOSIS — E662 Morbid (severe) obesity with alveolar hypoventilation: Secondary | ICD-10-CM | POA: Diagnosis not present

## 2019-10-29 DIAGNOSIS — E661 Drug-induced obesity: Secondary | ICD-10-CM | POA: Diagnosis not present

## 2019-10-29 DIAGNOSIS — S12000D Unspecified displaced fracture of first cervical vertebra, subsequent encounter for fracture with routine healing: Secondary | ICD-10-CM

## 2019-10-29 NOTE — Patient Instructions (Signed)
Safe Surgery and Sleep Apnea Sleep apnea is a condition in which breathing pauses or becomes shallow during sleep. Most people with the condition are not aware that they have it. It is important for your health care providers to know whether or not you have sleep apnea, especially if you are having surgery. Sleep apnea can increase your risk of complications during and after surgery. What is sleep apnea screening? Sleep apnea screening is a test to determine if you are at risk for sleep apnea. Before you have surgery, get screened for sleep apnea and talk with your surgeon and primary health care provider about your results. Screening usually involves answering a list of questions about your sleep quality. Ask your health care provider if you can be screened, or take a screening test yourself. You can find these tests online at the American Sleep Apnea Association website. Some questions you may be asked include:  Do you snore?  Is your sleep restless?  Do you have daytime sleepiness?  Has a partner or spouse told you that you stop breathing during sleep?  Have you had trouble concentrating or memory loss? Answer these questions honestly. If a screening test is positive, this means you are at risk for the condition. Further testing may be needed to confirm a diagnosis of sleep apnea. Why does sleep apnea increase the risk for complications? Untreated sleep apnea increases the risk for certain complications during and after surgery. This is because when you have sleep apnea, your airways are more sensitive to medicines used during surgery. The airways can collapse and block the flow of air.  Having untreated sleep apnea can increase your risk for:  A longer stay in the recovery room or hospital.  Breathing difficulties such as low oxygen levels after surgery.  Increased pain after surgery.  Irregular heart rhythms.  Stroke.  Heart attack. You and your health care provider can take steps  to help prevent these and other complications. What should I do if I have sleep apnea?  Before surgery  Tell your health care provider and anesthesia specialist that you have sleep apnea. Discuss your individual risks based on your screening results, the type of surgery you will be having, and other medical conditions that you have.  If you have a sleep apnea device (positive airway pressure device), wear it as prescribed. If you have not been wearing your device, talk with your health care provider about why you have not been wearing it. There are ways to improve your use of the device, such as: ? Adjusting the mask. ? Adding humidified air. ? Getting treatment for nasal congestion.  Do not use any products that contain nicotine or tobacco, such as cigarettes and e-cigarettes. If you need help quitting, ask your health care provider. On the day of surgery  If instructed by your health care provider, bring your sleep apnea device with you.  Wear your sleep apnea device when you are sleeping during your hospital stay, or as told by your health care provider.  Ask your health care provider what special considerations will be taken during and after your surgery. After surgery  You may need to be given extra oxygen and wear a continuous oxygen monitor (pulse oximetry).  For your safety, you may need to stay in the recovery room or hospital for longer than is normal.  Follow instructions from your health care provider about wearing your sleep device: ? Anytime you are sleeping, including during daytime naps. ? While taking   prescription pain medicines, sleeping medicines, or medicines that make you drowsy.  If your health care provider approves, raise the head of your bed or lie on your side. Do not lie flat on your back.  Follow instructions from your health care provider about medicines: ? Avoid using sleep medicines unless they are prescribed by a health care provider who is aware of  the results of your sleep apnea screening. ? Avoid using sleep medicines while taking opioid pain medicine. ? Limit your use of opioid pain medicines as much as possible. Ask your health care provider what is a safe amount to use. ? Ask about using pain medicines that do not affect your breathing, such as NSAIDs or acetaminophen. Where to find more information For more information about sleep apnea screening and healthy sleep, visit these websites:  Centers for Disease Control and Prevention: www.cdc.gov/sleep/index.html  American Sleep Apnea Association: www.sleepapnea.org Contact a health care provider if:  You have sleep apnea or think you may be at risk for sleep apnea, and you are scheduled for surgery. Get help right away if:  You have trouble breathing.  You are very drowsy and cannot stay awake.  You are told that you have pauses in your breathing during sleep after surgery.  You have chest pain.  You have a fast heartbeat. Summary  It is important for your health care providers to know whether or not you have sleep apnea, especially if you are having surgery.  If you have sleep apnea, you are at an increased risk for complications during surgery.  You and your health care provider can take precautions to help prevent complications. If you have sleep apnea, make sure to tell your health care provider and anesthesia specialist. This information is not intended to replace advice given to you by your health care provider. Make sure you discuss any questions you have with your health care provider. Document Revised: 06/30/2018 Document Reviewed: 06/24/2016 [employer] Patient Education  2020 [employer]  

## 2019-10-29 NOTE — Addendum Note (Signed)
Addended by: Larey Seat on: 10/29/2019 10:54 AM   Modules accepted: Orders
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
## 2019-10-31 DIAGNOSIS — M1712 Unilateral primary osteoarthritis, left knee: Secondary | ICD-10-CM | POA: Diagnosis not present

## 2019-10-31 DIAGNOSIS — M25562 Pain in left knee: Secondary | ICD-10-CM | POA: Diagnosis not present

## 2019-11-07 DIAGNOSIS — M1712 Unilateral primary osteoarthritis, left knee: Secondary | ICD-10-CM | POA: Diagnosis not present

## 2019-11-07 DIAGNOSIS — M25562 Pain in left knee: Secondary | ICD-10-CM | POA: Diagnosis not present

## 2019-11-16 DIAGNOSIS — E039 Hypothyroidism, unspecified: Secondary | ICD-10-CM | POA: Diagnosis not present

## 2019-11-28 ENCOUNTER — Ambulatory Visit (INDEPENDENT_AMBULATORY_CARE_PROVIDER_SITE_OTHER): Payer: Medicare Other | Admitting: Neurology

## 2019-11-28 DIAGNOSIS — G4733 Obstructive sleep apnea (adult) (pediatric): Secondary | ICD-10-CM

## 2019-11-28 DIAGNOSIS — E662 Morbid (severe) obesity with alveolar hypoventilation: Secondary | ICD-10-CM

## 2019-11-28 DIAGNOSIS — Z9989 Dependence on other enabling machines and devices: Secondary | ICD-10-CM

## 2019-11-28 DIAGNOSIS — G061 Intraspinal abscess and granuloma: Secondary | ICD-10-CM

## 2019-11-28 DIAGNOSIS — R0601 Orthopnea: Secondary | ICD-10-CM

## 2019-11-28 DIAGNOSIS — E661 Drug-induced obesity: Secondary | ICD-10-CM

## 2019-11-28 DIAGNOSIS — J869 Pyothorax without fistula: Secondary | ICD-10-CM

## 2019-11-28 DIAGNOSIS — S12000D Unspecified displaced fracture of first cervical vertebra, subsequent encounter for fracture with routine healing: Secondary | ICD-10-CM

## 2019-12-04 ENCOUNTER — Encounter: Payer: Self-pay | Admitting: Neurology

## 2019-12-04 ENCOUNTER — Telehealth: Payer: Self-pay | Admitting: Neurology

## 2019-12-04 NOTE — Telephone Encounter (Signed)
-----   Message from Larey Seat, MD sent at 12/04/2019 12:34 PM EDT ----- Confirmed severe sleep apnea - limited data otherwise.  Summary & Diagnosis:     This HST confirmed severe sleep apnea, at an AHI of 87/h. This is  severest apnea, yet obstructive or central type couldn't be  differentiated, and neither REM versus NREM stages of sleep. all  sleep recorded as supine, no hypoxemia or tachy-bradycardia was  noted.  Recommendations:    This patient needs PAP therapy and used 18 cm water pressure with  2 cm EPR in the past.  I will order auto CPAP 8-20 cm water, 3 cm  EPR and mask of her choice, heated humidification. RV with NP  after 3 month of use.    Interpreting Physician: Larey Seat, MD

## 2019-12-04 NOTE — Addendum Note (Signed)
Addended by: Larey Seat on: 12/04/2019 12:34 PM   Modules accepted: Orders

## 2019-12-04 NOTE — Procedures (Signed)
Sleep Study Report   Patient Information     First Name: Tiffany Last Name: [name] ID: [id number]  Birth Date: 10-26-2050 Age: 69 Gender: Female  Referring Provider: Crist Infante, MD BMI: 45.0 (W=238 lb, H=5' 1'')  Neck Circ.:  16 '' Epworth:  9/24   Sleep Study Information    Study Date: 11/28/19 S/H/A Version: [id number] / 4.2.1023 / 79  History:    [name] is a 69 y.o. female patient reporting trouble to get CPAP supplies since entering Medicare, beginning  in May 2019.  She was seen first on 01-24-2010 upon referral from Dr. Joylene Draft for establishing sleep care.  Her last sleep study with me was performed in 2019 but yielded not enough data and a repeat study did the same.  We now have no recent baseline. As the pandemic hit, no follow up had been arranged. She" fell through the cracks". She reports having been diagnosed with Grave's disease, had autoimmune mediated alopecia, suffered many falls, suffering an ankle injury and dislocated arm. She is now too heavy (BMI 45) to get a knee replacement.  Spinal injection caused a discitis, an infection was introduced into the bone- She had to undergo surgery with Dr Ellene Route, followed by inpatient rehab. The rehab facility at [facility] center did not use her CPAP (!). She was isolated due to Covid, while she felt depressed and in pain- we need to get a new baseline to order supplies for her.     Summary & Diagnosis:      This HST confirmed severe sleep apnea, at an AHI of 87/h. This is severest apnea, yet obstructive or central type couldn't be differentiated, and neither REM versus NREM stages of sleep. all sleep recorded as supine, no hypoxemia or tachy-bradycardia was noted.  Recommendations:     This patient needs PAP therapy and used 18 cm water pressure with 2 cm EPR in the past. I will order auto CPAP 8-20 cm water, 3 cm EPR and mask of her choice, heated humidification. RV with NP after 3 month of use.    Interpreting Physician: Larey Seat, MD            Sleep Summary  Oxygen Saturation Statistics   Start Study Time: End Study Time: Total Recording Time:         11:33:23 PM 2:50:41 AM   3 h, 17 min  Total Sleep Time Inconclusive REM Detection 2 h, 24 min    Mean: 92 Minimum: 81 Maximum: 97  Mean of Desaturations Nadirs (%):   89  Oxygen Desaturation. %:  4-9 10-20 >20 Total  Events Number Total   179  9 95.2 4.8  0 0.0  188 100.0  Oxygen Saturation: <90 <=88 <85 <80 <70  Duration (minutes): Sleep % 12.6 8.7 5.5 0.2 3.8 0.1 0.0 0.0 0.0 0.0     Respiratory Indices      Total Events REM NREM All Night  pRDI: pAHI 3%: ODI 4%: pAHIc 3%: % CSR: pAHI 4%:  208  208  188  17 13.5 190 N/A N/A N/A N/A N/A N/A N/A N/A 87.7 87.7 79.3 7.2 80.1       Pulse Rate Statistics during Sleep (BPM)      Mean: 61 Minimum: 51 Maximum: 76    Indices are calculated using technically valid sleep time of 2 h, 22 min.  pAHI=87.7                                                                                              Mild              Moderate                    Severe                                                 5              15                    30                 Body Position Statistics  Position Supine Prone Right Left Non-Supine  Sleep (min) 144.8 0.0 0.0 0.0 0.0  Sleep % 100.0 0.0 0.0 0.0 0.0  pRDI 87.7 N/A N/A N/A N/A  pAHI 3% 87.7 N/A N/A N/A N/A  ODI 4% 79.3 N/A N/A N/A N/A      Supine    Snoring Statistics Snoring Level (dB) >40 >50 >60 >70 >80 >Threshold (45)  Sleep (min) 116.3 2.4 0.4 0.0 0.0 19.6  Sleep % 80.3 1.7 0.3 0.0 0.0 13.5

## 2019-12-04 NOTE — Progress Notes (Signed)
Confirmed severe sleep apnea - limited data otherwise.  Summary & Diagnosis:     This HST confirmed severe sleep apnea, at an AHI of 87/h. This is  severest apnea, yet obstructive or central type couldn't be  differentiated, and neither REM versus NREM stages of sleep. all  sleep recorded as supine, no hypoxemia or tachy-bradycardia was  noted.  Recommendations:    This patient needs PAP therapy and used 18 cm water pressure with  2 cm EPR in the past.  I will order auto CPAP 8-20 cm water, 3 cm  EPR and mask of her choice, heated humidification. RV with NP  after 3 month of use.    Interpreting Physician: Larey Seat, MD

## 2019-12-04 NOTE — Telephone Encounter (Signed)
Called the patient and advised the HST indicated Severe sleep apnea still present. Patient currently has CPAP set up around 2018. I advised that if her current machine has the capability of changing to auto CPAP then that pressure will be changed to reflect that. Advised If that is not capable then we will reassess. Scheduled patient for 3/4 mth follow up.

## 2019-12-17 DIAGNOSIS — G4733 Obstructive sleep apnea (adult) (pediatric): Secondary | ICD-10-CM | POA: Diagnosis not present

## 2019-12-17 DIAGNOSIS — E538 Deficiency of other specified B group vitamins: Secondary | ICD-10-CM | POA: Diagnosis not present

## 2019-12-17 DIAGNOSIS — E039 Hypothyroidism, unspecified: Secondary | ICD-10-CM | POA: Diagnosis not present

## 2019-12-17 DIAGNOSIS — I10 Essential (primary) hypertension: Secondary | ICD-10-CM | POA: Diagnosis not present

## 2019-12-17 DIAGNOSIS — D509 Iron deficiency anemia, unspecified: Secondary | ICD-10-CM | POA: Diagnosis not present

## 2019-12-17 DIAGNOSIS — F339 Major depressive disorder, recurrent, unspecified: Secondary | ICD-10-CM | POA: Diagnosis not present

## 2019-12-17 DIAGNOSIS — D61818 Other pancytopenia: Secondary | ICD-10-CM | POA: Diagnosis not present

## 2019-12-17 DIAGNOSIS — D649 Anemia, unspecified: Secondary | ICD-10-CM | POA: Diagnosis not present

## 2019-12-17 DIAGNOSIS — Z23 Encounter for immunization: Secondary | ICD-10-CM | POA: Diagnosis not present

## 2019-12-17 DIAGNOSIS — E1129 Type 2 diabetes mellitus with other diabetic kidney complication: Secondary | ICD-10-CM | POA: Diagnosis not present

## 2020-01-07 DIAGNOSIS — Z23 Encounter for immunization: Secondary | ICD-10-CM | POA: Diagnosis not present

## 2020-01-08 ENCOUNTER — Other Ambulatory Visit: Payer: Self-pay

## 2020-01-08 ENCOUNTER — Ambulatory Visit: Payer: BLUE CROSS/BLUE SHIELD | Admitting: Internal Medicine

## 2020-01-08 ENCOUNTER — Telehealth: Payer: Medicare Other | Admitting: Internal Medicine

## 2020-01-08 ENCOUNTER — Telehealth: Payer: Self-pay

## 2020-01-08 NOTE — Telephone Encounter (Signed)
Attempted to contact patient via cell and home phone number. No answer, patient does not have identifiable voicemail. Appointment today could not be completed.   Beryle Flock, RN

## 2020-01-09 ENCOUNTER — Other Ambulatory Visit: Payer: Self-pay

## 2020-01-09 ENCOUNTER — Inpatient Hospital Stay: Payer: Medicare Other | Attending: Oncology | Admitting: Oncology

## 2020-01-09 ENCOUNTER — Inpatient Hospital Stay: Payer: Medicare Other

## 2020-01-09 ENCOUNTER — Telehealth: Payer: Self-pay

## 2020-01-09 VITALS — BP 126/65 | HR 55 | Temp 97.9°F | Resp 18 | Ht 61.0 in | Wt 230.6 lb

## 2020-01-09 DIAGNOSIS — I10 Essential (primary) hypertension: Secondary | ICD-10-CM | POA: Diagnosis not present

## 2020-01-09 DIAGNOSIS — Z8249 Family history of ischemic heart disease and other diseases of the circulatory system: Secondary | ICD-10-CM | POA: Diagnosis not present

## 2020-01-09 DIAGNOSIS — E119 Type 2 diabetes mellitus without complications: Secondary | ICD-10-CM | POA: Diagnosis not present

## 2020-01-09 DIAGNOSIS — Z833 Family history of diabetes mellitus: Secondary | ICD-10-CM | POA: Insufficient documentation

## 2020-01-09 DIAGNOSIS — Z79899 Other long term (current) drug therapy: Secondary | ICD-10-CM | POA: Insufficient documentation

## 2020-01-09 DIAGNOSIS — E039 Hypothyroidism, unspecified: Secondary | ICD-10-CM | POA: Insufficient documentation

## 2020-01-09 DIAGNOSIS — D508 Other iron deficiency anemias: Secondary | ICD-10-CM

## 2020-01-09 DIAGNOSIS — D61818 Other pancytopenia: Secondary | ICD-10-CM | POA: Insufficient documentation

## 2020-01-09 DIAGNOSIS — Z8052 Family history of malignant neoplasm of bladder: Secondary | ICD-10-CM | POA: Insufficient documentation

## 2020-01-09 DIAGNOSIS — K862 Cyst of pancreas: Secondary | ICD-10-CM | POA: Insufficient documentation

## 2020-01-09 DIAGNOSIS — E785 Hyperlipidemia, unspecified: Secondary | ICD-10-CM | POA: Diagnosis not present

## 2020-01-09 DIAGNOSIS — Z794 Long term (current) use of insulin: Secondary | ICD-10-CM | POA: Insufficient documentation

## 2020-01-09 LAB — CBC WITH DIFFERENTIAL (CANCER CENTER ONLY)
Abs Immature Granulocytes: 0.01 10*3/uL (ref 0.00–0.07)
Basophils Absolute: 0 10*3/uL (ref 0.0–0.1)
Basophils Relative: 0 %
Eosinophils Absolute: 0 10*3/uL (ref 0.0–0.5)
Eosinophils Relative: 0 %
HCT: 33.5 % — ABNORMAL LOW (ref 36.0–46.0)
Hemoglobin: 11.2 g/dL — ABNORMAL LOW (ref 12.0–15.0)
Immature Granulocytes: 1 %
Lymphocytes Relative: 42 %
Lymphs Abs: 0.9 10*3/uL (ref 0.7–4.0)
MCH: 27.3 pg (ref 26.0–34.0)
MCHC: 33.4 g/dL (ref 30.0–36.0)
MCV: 81.7 fL (ref 80.0–100.0)
Monocytes Absolute: 0.2 10*3/uL (ref 0.1–1.0)
Monocytes Relative: 10 %
Neutro Abs: 1 10*3/uL — ABNORMAL LOW (ref 1.7–7.7)
Neutrophils Relative %: 47 %
Platelet Count: 125 10*3/uL — ABNORMAL LOW (ref 150–400)
RBC: 4.1 MIL/uL (ref 3.87–5.11)
RDW: 13.8 % (ref 11.5–15.5)
WBC Count: 2 10*3/uL — ABNORMAL LOW (ref 4.0–10.5)
nRBC: 0 % (ref 0.0–0.2)

## 2020-01-09 LAB — CMP (CANCER CENTER ONLY)
ALT: 17 U/L (ref 0–44)
AST: 23 U/L (ref 15–41)
Albumin: 3.8 g/dL (ref 3.5–5.0)
Alkaline Phosphatase: 95 U/L (ref 38–126)
Anion gap: 7 (ref 5–15)
BUN: 22 mg/dL (ref 8–23)
CO2: 28 mmol/L (ref 22–32)
Calcium: 9.8 mg/dL (ref 8.9–10.3)
Chloride: 104 mmol/L (ref 98–111)
Creatinine: 1.13 mg/dL — ABNORMAL HIGH (ref 0.44–1.00)
GFR, Estimated: 50 mL/min — ABNORMAL LOW (ref >60)
Glucose, Bld: 139 mg/dL — ABNORMAL HIGH (ref 70–99)
Potassium: 4.2 mmol/L (ref 3.5–5.1)
Sodium: 139 mmol/L (ref 135–145)
Total Bilirubin: 0.4 mg/dL (ref 0.3–1.2)
Total Protein: 6.9 g/dL (ref 6.5–8.1)

## 2020-01-09 NOTE — Progress Notes (Signed)
[name]  [phone] [phone]) [phone]     ID: [name] DOB: 1951-02-28  MR#: [id number]  HGD#:[id number]  Patient Care Team: Tiffany Infante, MD as PCP - General (Internal Medicine) Tiffany Bring, MD as Consulting Physician (Urology) Tiffany Banister, MD as Attending Physician (Gastroenterology) Tiffany Bickers, MD as Consulting Physician (Infectious Diseases) [name], Tiffany Dad, MD as Consulting Physician (Oncology) Tiffany Cruel, MD OTHER MD:  CHIEF COMPLAINT: pancytopenia  CURRENT TREATMENT: IV iron when needed; observation   INTERVAL HISTORY: [name] returns today for follow up of her pancytopenia and history of iron deficiency.  Since her last visit, she had an MRI of the abdomen 07/09/2019 which shows a spleen just above normal in size at 12.9 cm.. Her most recent feraheme infusion was on 07/23/2019.  Her most recent counts are stable Results for Tiffany, [name] (MRN [id number]) as of 01/09/2020 18:02  Ref. Range 10/11/2018 16:09 07/10/2019 08:49 08/14/2019 15:39 10/02/2019 11:22 01/09/2020 13:13  WBC Latest Ref Range: 4.0 - 10.5 K/uL 2.1 (L) 2.3 (L) 5.3 2.0 (L) 2.0 (L)  Results for Tiffany, [name] (MRN [id number]) as of 01/09/2020 18:02  Ref. Range 10/11/2018 16:09 07/10/2019 08:49 08/14/2019 15:39 10/02/2019 11:22 01/09/2020 13:13  Platelets Latest Ref Range: 150 - 400 K/uL 173 139 (L) 139.0 (L) 127 (L) 125 (L)  Results for Tiffany, [name] (MRN [id number]) as of 01/09/2020 18:02  Ref. Range 10/11/2018 16:09 07/10/2019 08:49 08/14/2019 15:39 10/02/2019 11:22 01/09/2020 13:13  Hemoglobin Latest Ref Range: 12.0 - 15.0 g/dL 9.6 (L) 8.3 (L) 11.1 (L) 11.3 (L) 11.2 (L)    REVIEW OF SYSTEMS: [name] tells me she can barely walk because of her left knee and that she would need to lose about 35 pounds before she could have a knee replacement which is what she needs she says.  She was in a skilled nursing facility receiving rehab and that helped but she is  having a great deal of difficulty now losing any weight.  She is trying to starve herself to do it.  However when she eats she eats things like crackers for example.  She tells me [name] is following her blood sugar and other parameters very closely.  A detailed review of systems today was otherwise stable  HISTORY OF CURRENT ILLNESS: From the original consult note:  [name] is being seen today at the request of [name] for her pancytopenia.  She sought an urgent appointment with him back in 05/2019 due to falling asleep easily.  This revealed that she had decreased WBC, HGB, and Plt counts.  Her WBC was 2.2 with an ANC of 1.1; Her hemoglobin 9 with an MCV of 81.4; and her platelet count of 138.  Her creatinine at that time was 1.1.  In 09/2018 her CBC was similar, however that was following/during a MRSA abscess hospitalization and recovery time period.  In reviewing her available labs in Epic, she appears to have a longstanding anemia, more recently microcytic see below.  She is taking b12 supplementation for b12 deficiency.   Ref. Range 12/18/2007 09:33 12/27/2007 03:40 08/15/2012 11:51 12/10/2013 09:00 07/21/2014 07:00 06/25/2015 11:45 01/06/2017 13:15 07/13/2017 10:40 10/11/2018 16:09 07/10/2019 08:49  Hemoglobin Latest Ref Range: 12.0 - 15.0 g/dL 12.3 11.4 (L) 10.9 (L) 9.9 (L) 10.9 (L) 9.9 (L) 9.2 (L) 11.7 (L) 9.6 (L) 8.3 (L)    Ref. Range 12/18/2007 09:33 12/27/2007 03:40 08/15/2012 11:51 12/10/2013 09:00 07/21/2014 07:00 06/25/2015 11:45 01/06/2017 13:15 07/13/2017 10:40 10/11/2018 16:09 07/10/2019 08:49  MCV Latest Ref Range: 80.0 - 100.0 fL 86.3 86.4 86.2 87.8 88.3 86.1 83.1 85.0 79.4 (L) 77.3 (L)   Her WBC and platelets have been as follows:    Ref. Range 12/18/2007 09:33 12/27/2007 03:40 08/15/2012 11:51 12/10/2013 09:00 07/21/2014 07:00 06/25/2015 11:45 01/06/2017 13:15 07/13/2017 10:40 10/11/2018 16:09 07/10/2019 08:49  WBC Latest Ref Range: 4.0 - 10.5 K/uL 4.2 6.7 4.1 5.6 3.5 (L) 9.7 4.2 2.7 (L) 2.1 (L) 2.3 (L)      Ref. Range 12/18/2007 09:33 12/27/2007 03:40 08/15/2012 11:51 12/10/2013 09:00 07/21/2014 07:00 06/25/2015 11:45 01/06/2017 13:15 07/13/2017 10:40 10/11/2018 16:09 07/10/2019 08:49  Platelets Latest Ref Range: 150 - 400 K/uL 202 197 163 194 144 (L) 156 257 158 173 139 (L)   The patient's subsequent history is as detailed below.   PAST MEDICAL HISTORY: Past Medical History:  Diagnosis Date  . Anemia   . Arthritis   . Asthma    OCCAS  . Bell's palsy   . Broken neck ([place])    C-1  . Carbuncle and furuncle of trunk   . Chronic dislocation of right shoulder   . Depression   . Disorder of fascia    HX OF NECROTIC FASCITIS AFTER ABDOMINAL SURGERY FOR HERNIA- REQUIRED 19 SURGERIES AND 2.5 MONTH HOSPITALIZATION AT [facility]  . DM (diabetes mellitus) ([place])   . Dysrhythmia    HX OF TACHYCARDIA AND BRADYCARDIA - ON GOING FOR YEARS - DOES NOT HAVE TO SEE CARDIOLOGIST  . Elevated cholesterol   . Fibromyalgia   . Fracture MAY 2014   HX OF FRACTURED NECK C1- CAUSES SEVERE HEADACHES--LIMITED ROM NECK  . Frequent infections    ESPECIALLY PRONE TO INFECTIONS AFTER SURGERIES  . Graves disease   . Heart murmur    DOES NOT CAUSE ANY PROBLEMS  . History of kidney stones   . Hyperlipidemia   . Hypertension   . Hypothyroidism    GRAVES DISEASE  . Migraine   . Nephrolithiasis    STAGE 3    DR. LESTER [name]  UROLOGIST  . Neuropathy   . Obesity   . OSA (obstructive sleep apnea)    USES CPAP - DOES NOT KNOW SETTING  . Pain    LEFT SHOULDER  PAIN -HARD TO LIE ON LEFT SIDE FOR LONG PERIOD;  PAIN IN LOWER BADK - 3 HERNIATED DISCS AND STENOISIS -  . PONV (postoperative nausea and vomiting)    THE GAS MAKES ME NAUSEATED  . Restless leg syndrome   . Sciatica   . Tachycardia   . Urinary frequency   . Urticaria   . UTI (urinary tract infection)     PAST SURGICAL HISTORY: Past Surgical History:  Procedure Laterality Date  . ABDOMINAL HYSTERECTOMY     LARGE TUMOR AT OVARY REMOVED  . ANTERIOR LAT  LUMBAR FUSION N/A 07/28/2018   Procedure: Anterolateral Decompression Lumbar One-Two for osteomyelitis reconstruction w/titanium strut allograft Fusion Lumbar One-Two;  Surgeon: Tiffany Miss, MD;  Location: [facility];  Service: Neurosurgery;  Laterality: N/A;  Left anterolateral approach  . APPENDECTOMY    . APPLICATION OF ROBOTIC ASSISTANCE FOR SPINAL PROCEDURE N/A 07/28/2018   Procedure: APPLICATION OF ROBOTIC ASSISTANCE FOR SPINAL PROCEDURE;  Surgeon: Tiffany Miss, MD;  Location: [facility];  Service: Neurosurgery;  Laterality: N/A;  . CARPAL TUNNEL RELEASE     BILATERAL  . CATARACT EXTRACTION W/ INTRAOCULAR LENS  IMPLANT, BILATERAL    . CESAREAN SECTION     X 3  . CHOLECYSTECTOMY    .  CYSTOSCOPY WITH URETEROSCOPY Right 12/03/2013   Procedure: CYSTOSCOPY WITH RIGHT RETROGRADE URETEROSCOPY LASER LITHOTRIPSY RIGHT STONE RIGHT URETERAL STENT, ;  Surgeon: Tiffany Bring, MD;  Location: [facility];  Service: Urology;  Laterality: Right;  PROCEDURE WAS ORIGINALLY SCHEDULED AS RIGHT PERCUTANEOUS NEPHROLITHOTOMY  . EYELID LACERATION REPAIR     RIGHT EYE  . HERNIA REPAIR     ABDOMINAL HERNIA REPAIR WITH MESH - 3 SURGERIES   . HX OF 19 SURGERIES FOR NECROTIC FASCITIS     SKIN GRAFTS+ WOUND  VAC  . I&D OF INFECTED SITE IN [facility] - FROM AN INJECTION    . IR FLUORO GUIDED NEEDLE PLC ASPIRATION/INJECTION LOC  06/03/2018  . JOINT REPLACEMENT     TOTAL RIGHT KNEE REPLACEMENT  . KNEE ARTHROSCOPY  RIGHT AND LEFT   X 2  . [place] RESECTION  2014  . NEPHROLITHOTOMY Right 12/10/2013   Procedure: NEPHROLITHOTOMY PERCUTANEOUS;  Surgeon: Tiffany Bring, MD;  Location: [facility];  Service: Urology;  Laterality: Right;  . POSTERIOR LUMBAR FUSION 4 LEVEL N/A 07/28/2018   Procedure: Posterior Fixation Thoracic Ten-Lumbar Four with pedicle augmentaion with robotic assistance;  Surgeon: Tiffany Miss, MD;  Location: [facility];  Service: Neurosurgery;  Laterality: N/A;  Posterior Fixation Thoracic Ten-Lumbar Four with  pedicle augmentaion with robotic assistance  . REVERSE SHOULDER ARTHROPLASTY Right 07/21/2017  . REVERSE SHOULDER ARTHROPLASTY Right 07/21/2017   Procedure: RIGHT REVERSE SHOULDER ARTHROPLASTY;  Surgeon: Tania Ade, MD;  Location: [facility];  Service: Orthopedics;  Laterality: Right;  . RIGHT FOOT DRAINAGE OF INFECTION    . shoulder arthroscopy Right    X 2  . TONSILLECTOMY     AND ADENOIDECTOMY    FAMILY HISTORY Family History  Problem Relation Age of Onset  . Diabetes Father   . Osteoarthritis Father   . Heart disease Father   . Ulcers Father   . Stroke Mother   . Bladder Cancer Mother   . Suicidality Sister   . Colon cancer Neg Hx   . Stomach cancer Neg Hx   . Rectal cancer Neg Hx   . Esophageal cancer Neg Hx     GYNECOLOGIC HISTORY:  No LMP recorded. Patient has had a hysterectomy. Menarche: 69 years old Age at first live birth: 69 years old GX P 3 LMP 40 years ago Contraceptive 1 year OCP HRT 5 years Hysterectomy? At age 80, still has cervix Salpingo-oophorectomy? At age 32, bilaterally   SOCIAL HISTORY:  [name] is married and lives with her husband and her son in [place], [place].  She is retired, however due to her multiple health issues has been on disability as well.  She has two other sons who also live in [place], [place]: not in place   HEALTH MAINTENANCE: Social History   Tobacco Use  . Smoking status: Never Smoker  . Smokeless tobacco: Never Used  Vaping Use  . Vaping Use: Never used  Substance Use Topics  . Alcohol use: No  . Drug use: No     Colonoscopy: 11 years ago  PAP: many years ago, says still has cervix  Bone density: unknown  Mammogram: many years ago, cant recall when   Allergies  Allergen Reactions  . Imitrex [Sumatriptan] Other (See Comments)    Vascular spasms  . Nsaids Other (See Comments)    PT UNABLE TO TOLERATE NSAID'S DRUGS DUE TO HX OF GASTRIC SLEEVE SURGERY  . Rosuvastatin Calcium Other (See  Comments)  Leg/muscle pain  . Statins Other (See Comments)    Leg/muscle pain  . Sulfonamide Derivatives Hives    Childhood allergy   . Codeine Nausea Only  . Promethazine Hcl Other (See Comments)    Restless leg feeling all over body  . Vicodin [Hydrocodone-Acetaminophen] Nausea And Vomiting    Current Outpatient Medications  Medication Sig Dispense Refill  . amLODipine (NORVASC) 5 MG tablet Take 5 mg by mouth daily.    . ARIPiprazole (ABILIFY) 5 MG tablet Take 1 tablet (5 mg total) by mouth daily. 30 tablet 1  . BD INSULIN SYRINGE U/F 31G X 5/16" 0.5 ML MISC USE 5 TIMES A DAY LEVEMIR AND NOVOLOG    . BD PEN NEEDLE MICRO U/F 32G X 6 MM MISC Inject 32 pens into the skin daily.    . Cholecalciferol (VITAMIN D3 PO) Take 1 tablet by mouth daily.     . clonazePAM (KLONOPIN) 0.5 MG tablet Take 0.5 mg by mouth at bedtime as needed for anxiety. Up to 3 times daily as needed for anxiety, take 1 tablet (0.5 mg) scheduled each night at bedtime.  0  . Cyanocobalamin (VITAMIN B 12 PO) Take 1 tablet by mouth daily.     . diclofenac Sodium (VOLTAREN) 1 % GEL Apply 1 g topically as needed.    . docusate sodium (COLACE) 100 MG capsule Take 1 capsule (100 mg total) by mouth 2 (two) times daily. 30 capsule 0  . doxazosin (CARDURA) 2 MG tablet     . doxycycline (VIBRA-TABS) 100 MG tablet TAKE 1 TABLET BY MOUTH TWICE DAILY. 56 tablet 2  . fenofibrate 54 MG tablet Take 54 mg by mouth daily with supper.  2  . fluconazole (DIFLUCAN) 150 MG tablet Take 1 tablet by mouth daily for 3 days then once weekly. 6 tablet 2  . FLUoxetine (PROZAC) 20 MG capsule Take 60 mg by mouth at bedtime.   3  . FREESTYLE TEST STRIPS test strip USE STRIPS TO TEST BLOOD GLUCOSE BID    . furosemide (LASIX) 20 MG tablet TK 2 TS PO QAM AND 1 T QPM    . gabapentin (NEURONTIN) 300 MG capsule Take 1 capsule (300 mg total) by mouth 3 (three) times daily for 30 days. 90 capsule 0  . insulin aspart (NOVOLOG) 100 UNIT/ML FlexPen Sliding  scale dose: CBG < 70: implement hypoglycemia protocol  CBG 70 - 120: 0 units  CBG 121 - 150: 0 units  CBG 151 - 200: 0 units  CBG 201 - 250: 2 units  CBG 251 - 300: 3 units  CBG 301 - 350: 4 units  CBG 351 - 400: 5 units 15 mL 0  . insulin glargine (LANTUS) 100 UNIT/ML injection Inject 0.2 mLs (20 Units total) into the skin 2 (two) times daily. 10 mL 0  . Lancets Misc. (UNISTIK 3 COMFORT) MISC USE LANCETS TO CHECK BLOOD GLUCOSE BID    . lidocaine (XYLOCAINE) 2 % solution Use as directed 15 mLs in the mouth or throat as needed for mouth pain. 100 mL 2  . methocarbamol (ROBAXIN) 500 MG tablet Take 1 tablet (500 mg total) by mouth every 6 (six) hours as needed for muscle spasms. 20 tablet 0  . metoprolol tartrate (LOPRESSOR) 50 MG tablet     . omeprazole (PRILOSEC) 20 MG capsule Take 20 mg by mouth daily with supper.     . ondansetron (ZOFRAN) 4 MG tablet Take 2 tablets (8 mg total) by mouth every 8 (eight)  hours as needed for refractory nausea / vomiting. For nausea/vomiting prevention with pain medication. 30 tablet 2  . oxybutynin (DITROPAN-XL) 10 MG 24 hr tablet Take 10 mg by mouth daily.    Marland Kitchen PREVIDENT 5000 DRY MOUTH 1.1 % GEL dental gel Take 1 application by mouth daily.     Marland Kitchen propylthiouracil (PTU) 50 MG tablet Take 75 mg by mouth daily.   0  . QUEtiapine (SEROQUEL) 50 MG tablet Take 50 mg by mouth at bedtime.    Marland Kitchen rOPINIRole (REQUIP) 2 MG tablet Take 3 mg by mouth at bedtime.     Marland Kitchen tiZANidine (ZANAFLEX) 4 MG tablet Take 4 mg by mouth 2 (two) times daily.     No current facility-administered medications for this visit.    OBJECTIVE: Morbidly obese white woman who appears stated age  69:   01/09/20 1338  BP: 126/65  Pulse: (!) 55  Resp: 18  Temp: 97.9 F (36.6 C)  SpO2: 95%     Body mass index is 43.57 kg/m.   Wt Readings from Last 3 Encounters:  01/09/20 230 lb 9.6 oz (104.6 kg)  10/29/19 237 lb (107.5 kg)  10/02/19 238 lb 14.4 oz (108.4 kg)  ECOG FS:2 - Symptomatic,  <50% confined to bed  Sclerae unicteric, EOMs intact Wearing a mask No cervical or supraclavicular adenopathy Lungs no rales or rhonchi Heart regular rate and rhythm Abd soft, nontender, positive bowel sounds MSK no focal spinal tenderness, no upper extremity lymphedema Neuro: nonfocal, well oriented, appropriate affect Breasts: Deferred   LAB RESULTS:  CMP     Component Value Date/Time   NA 139 01/09/2020 1313   K 4.2 01/09/2020 1313   CL 104 01/09/2020 1313   CO2 28 01/09/2020 1313   GLUCOSE 139 (H) 01/09/2020 1313   BUN 22 01/09/2020 1313   CREATININE 1.13 (H) 01/09/2020 1313   CREATININE 0.85 10/11/2018 1609   CALCIUM 9.8 01/09/2020 1313   PROT 6.9 01/09/2020 1313   ALBUMIN 3.8 01/09/2020 1313   AST 23 01/09/2020 1313   ALT 17 01/09/2020 1313   ALKPHOS 95 01/09/2020 1313   BILITOT 0.4 01/09/2020 1313   GFRNONAA 50 (L) 01/09/2020 1313   GFRAA 56 (L) 10/02/2019 1122    No results found for: TOTALPROTELP, ALBUMINELP, A1GS, A2GS, BETS, BETA2SER, GAMS, MSPIKE, SPEI  No results found for: KPAFRELGTCHN, LAMBDASER, KAPLAMBRATIO  Lab Results  Component Value Date   WBC 2.0 (L) 01/09/2020   NEUTROABS 1.0 (L) 01/09/2020   HGB 11.2 (L) 01/09/2020   HCT 33.5 (L) 01/09/2020   MCV 81.7 01/09/2020   PLT 125 (L) 01/09/2020      Chemistry      Component Value Date/Time   NA 139 01/09/2020 1313   K 4.2 01/09/2020 1313   CL 104 01/09/2020 1313   CO2 28 01/09/2020 1313   BUN 22 01/09/2020 1313   CREATININE 1.13 (H) 01/09/2020 1313   CREATININE 0.85 10/11/2018 1609      Component Value Date/Time   CALCIUM 9.8 01/09/2020 1313   ALKPHOS 95 01/09/2020 1313   AST 23 01/09/2020 1313   ALT 17 01/09/2020 1313   BILITOT 0.4 01/09/2020 1313     No results found for: LABCA2  No components found for: RXVQMG867  No results for input(s): INR in the last 168 hours.  No results found for: LABCA2  No results found for: YPP509  No results found for: TOI712  No results  found for: WPY099  No results found for: IP3825  No components found for: HGQUANT  No results found for: CEA1 / No results found for: CEA1   No results found for: AFPTUMOR  No results found for: CHROMOGRNA  No results found for: TOTALPROTELP, ALBUMINELP, A1GS, A2GS, BETS, BETA2SER, GAMS, MSPIKE, SPEI (this displays SPEP labs)  No results found for: KPAFRELGTCHN, LAMBDASER, KAPLAMBRATIO (kappa/lambda light chains)  No results found for: HGBA, HGBA2QUANT, HGBFQUANT, HGBSQUAN (Hemoglobinopathy evaluation)   No results found for: LDH  Lab Results  Component Value Date   IRON 64 10/02/2019   TIBC 333 10/02/2019   IRONPCTSAT 19 (L) 10/02/2019   (Iron and TIBC)  Lab Results  Component Value Date   FERRITIN 44 10/02/2019    Urinalysis    Component Value Date/Time   COLORURINE YELLOW 07/14/2018 0914   APPEARANCEUR CLEAR 07/14/2018 0914   LABSPEC 1.013 07/14/2018 0914   PHURINE 6.0 07/14/2018 0914   GLUCOSEU NEGATIVE 07/14/2018 0914   HGBUR NEGATIVE 07/14/2018 0914   BILIRUBINUR NEGATIVE 07/14/2018 0914   KETONESUR 5 (A) 07/14/2018 0914   PROTEINUR NEGATIVE 07/14/2018 0914   UROBILINOGEN 0.2 07/20/2014 1707   NITRITE NEGATIVE 07/14/2018 0914   LEUKOCYTESUR NEGATIVE 07/14/2018 0914    STUDIES: No results found.   ASSESSMENT: 69 y.o. [place] woman with multiple comorbidities referred to hematology for evaluation of pancytopenia.    1. Microcytic Anemia  (a) Iron deficiency--Feraheme given on 4/26 and 5/10  (b) B12 and folate on 07/10/2019 were normal  2. Neutropenia and thrombocytopenia  (a) Inflammatory markers in 06/2019 normal  (b) possibly or partly secondary to splenomegaly   3. Incidental Pancreatic Cyst noted 05/2019  (a) Followed by Dr. Ardis Hughs  (b) stable on most recent MRI 06/2019  (c) Due for repeat imaging in 06/2021   PLAN: Danija continues to have pancytopenia without any evidence of hematologic neoplasia that I can ascertain.  We again  discussed the possibility of proceeding to bone marrow biopsy which would settle the issue, but she is understandably reluctant to undergo the procedure and prefers continued follow-up.  She has not had intercurrent infections or unusual bleeding or bruising and her hemoglobin is now stably over 11.  Given how closely she is followed by her primary care physician I think checking labs here in 6 months and again in 12 months with a visit is adequate but of course I will be glad to see her again at any time if any new issues develop  In terms of diet I suggested that she avoid white bread white rice potatoes past and of course any soft drinks and concentrated sweets.  She can eat any vegetables except for those, knots, meat, and some dairy and hopefully by doing that and cutting on portions she can try to get to her weight of 195 pounds which would allow her to have a knee replacement  Total encounter time 25 minutes.Sarajane Jews C. Faiza Bansal, MD 01/09/20 5:57 PM Medical Oncology and Hematology [facility] [place], [place] [address] Tel. [phone]    Fax. [phone]   I, Wilburn Mylar, am acting as scribe for Dr. Virgie [name]. [name].  I, Lurline Del MD, have reviewed the above documentation for accuracy and completeness, and I agree with the above.    *Total Encounter Time as defined by the Centers for Medicare and Medicaid Services includes, in addition to the face-to-face time of a patient visit (documented in the note above) non-face-to-face time: obtaining and reviewing outside history, ordering and reviewing medications, tests or  procedures, care coordination (communications with other health care professionals or caregivers) and documentation in the medical record.

## 2020-01-09 NOTE — Telephone Encounter (Signed)
Called patient to reschedule appointment with provider, left voicemail to call us back and get that scheduled

## 2020-01-11 ENCOUNTER — Telehealth: Payer: Self-pay | Admitting: Oncology

## 2020-01-11 NOTE — Telephone Encounter (Signed)
Scheduled per 10/20 los. Called pt and left a msg

## 2020-01-21 ENCOUNTER — Other Ambulatory Visit: Payer: Self-pay | Admitting: Internal Medicine

## 2020-01-21 DIAGNOSIS — M4646 Discitis, unspecified, lumbar region: Secondary | ICD-10-CM

## 2020-01-29 ENCOUNTER — Telehealth: Payer: Self-pay

## 2020-01-29 ENCOUNTER — Other Ambulatory Visit: Payer: Self-pay

## 2020-01-29 ENCOUNTER — Telehealth (INDEPENDENT_AMBULATORY_CARE_PROVIDER_SITE_OTHER): Payer: Medicare Other | Admitting: Internal Medicine

## 2020-01-29 ENCOUNTER — Encounter: Payer: Self-pay | Admitting: Internal Medicine

## 2020-01-29 DIAGNOSIS — M4646 Discitis, unspecified, lumbar region: Secondary | ICD-10-CM | POA: Diagnosis not present

## 2020-01-29 MED ORDER — DOXYCYCLINE HYCLATE 100 MG PO TABS: 100.0000 mg | ORAL_TABLET | Freq: Two times a day (BID) | ORAL | 11 refills | Status: DC

## 2020-01-29 NOTE — Progress Notes (Signed)
Virtual Visit via Telephone Note  I connected with [name] on 01/29/20 at  2:15 PM EST by telephone and verified that I am speaking with the correct person using two identifiers.  Location: Patient: Home Provider: [facility]   I discussed the limitations, risks, security and privacy concerns of performing an evaluation and management service by telephone and the availability of in person appointments. I also discussed with the patient that there may be a patient responsible charge related to this service. The patient expressed understanding and agreed to proceed.   History of Present Illness: I called and spoke with [name] today.  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  Despite a 6-week course of IV vancomycin she had persistence and was hospitalized and required lumbar fusion in May of last year.  Remains on chronic doxycycline.  He is not having any problems tolerating it.   Observations/Objective: Sed Rate  Date Value  07/10/2019 38 mm/hr (H)  12/20/2018 34 mm/h (H)  10/11/2018 39 mm/h (H)   CRP  Date Value  07/10/2019 0.6 mg/dL  12/20/2018 5.3 mg/L  10/11/2018 7.1 mg/L    Assessment and Plan: She prefers to stay on chronic, suppressive doxycycline.  Follow Up Instructions: Continue doxycycline and follow-up here in 6 months   I discussed the assessment and treatment plan with the patient. The patient was provided an opportunity to ask questions and all were answered. The patient agreed with the plan and demonstrated an understanding of the instructions.   The patient was advised to call back or seek an in-person evaluation if the symptoms worsen or if the condition fails to improve as anticipated.  I provided 15 minutes of non-face-to-face time during this encounter.   Michel Bickers, MD

## 2020-01-29 NOTE — Telephone Encounter (Signed)
Patient scheduled for an evisit with Dr. Megan Salon today. Attempted to connect with via phone and left voicemail to return call to [place]

## 2020-02-23 IMAGING — CT CT LUMBAR SPINE WITHOUT CONTRAST
3 series · 14 of 33 positions shown, 17 images · non-contrast
Comparison: CT lumbar spine 05/31/2018

MRI lumbar spine 06/02/2018, 07/11/2018.

CLINICAL DATA: Discitis-osteomyelitis, MRSA, with worsening low
back pain

EXAM:
CT LUMBAR SPINE WITHOUT CONTRAST
TECHNIQUE: Multidetector CT imaging of the lumbar spine was performed without
intravenous contrast administration. Multiplanar CT image
reconstructions were also generated.

[Series 4: l spine soft · axial · 0.29mm/px · z∈[-498,-344]mm · 6 of 101 slices shown, 8 images]
[im 16/101  soft-tissue]
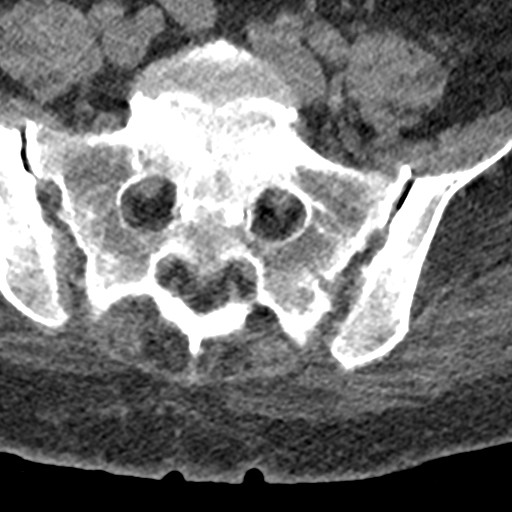
[im 16/101  bone]
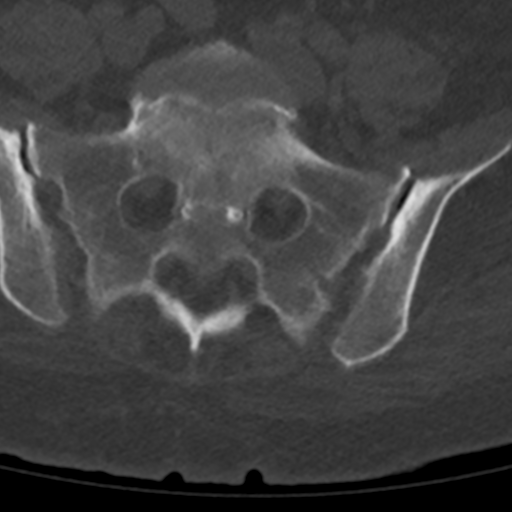
[im 31/101  bone]
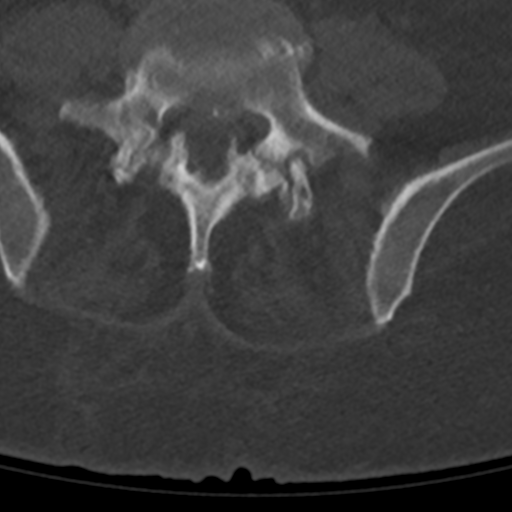
[im 47/101  bone]
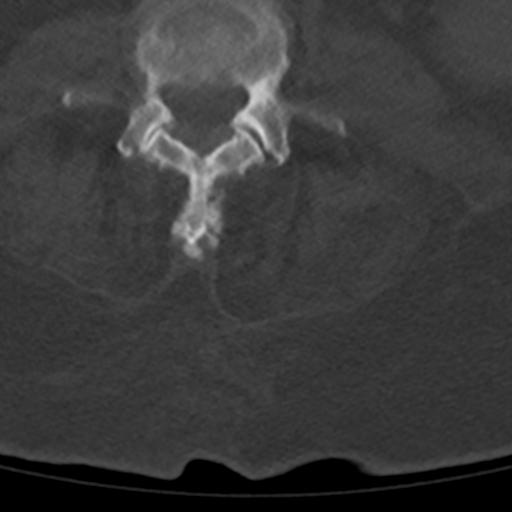
[im 62/101  bone]
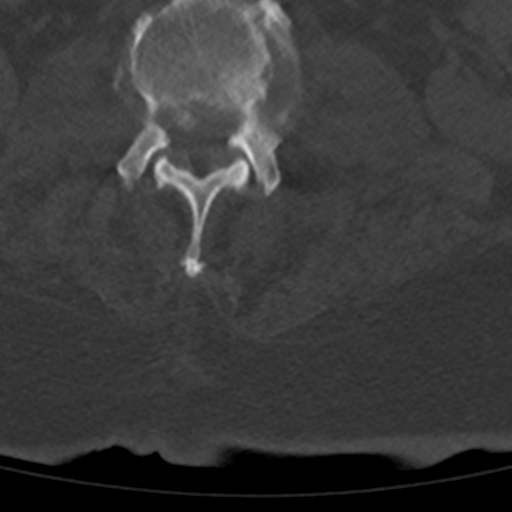
[im 77/101  soft-tissue]
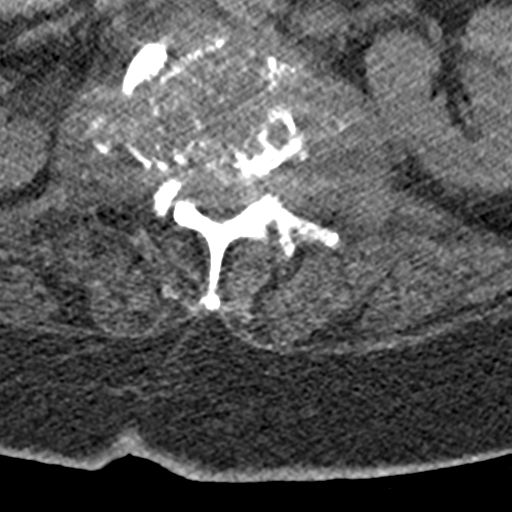
[im 77/101  bone]
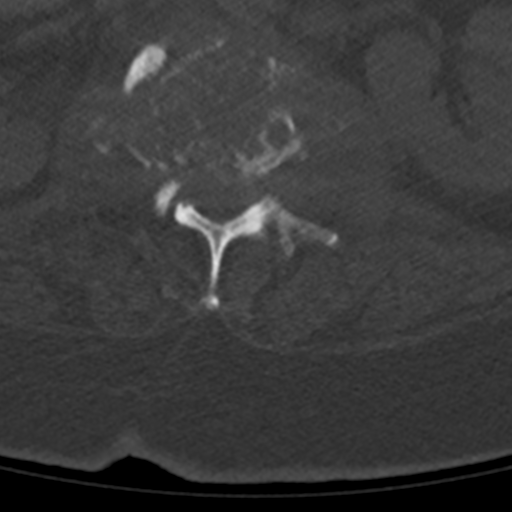
[im 93/101  bone]
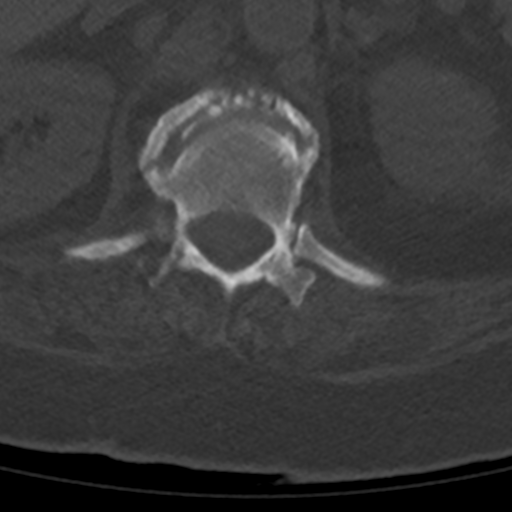

[Series 5: sagittal bone · sagittal · 0.35mm/px · 5 of 60 slices shown, 6 images]
[im 20/60  bone]
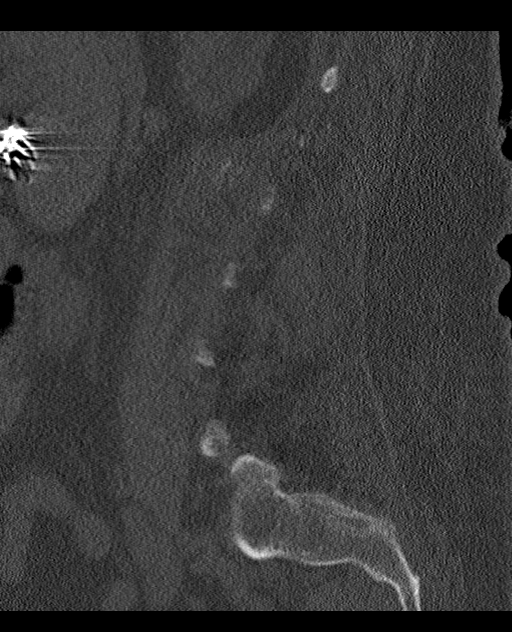
[im 25/60  bone]
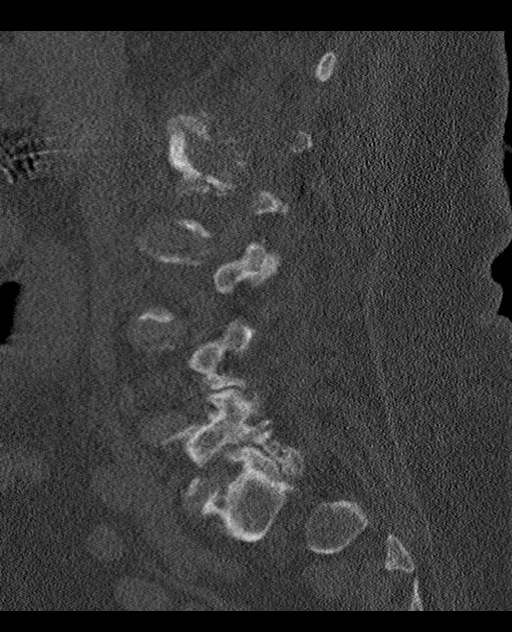
[im 30/60  soft-tissue]
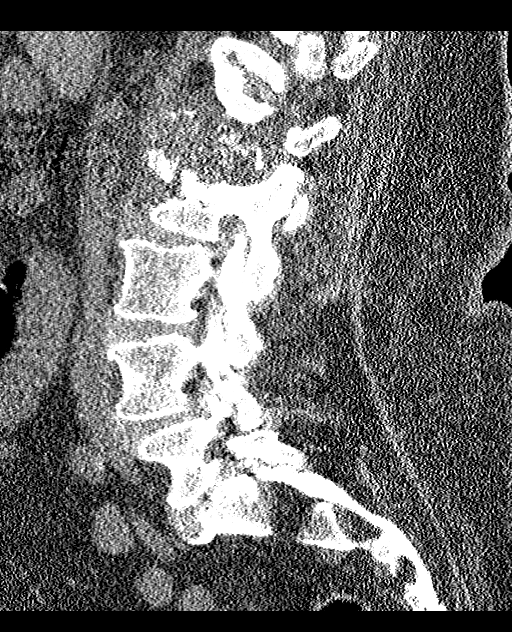
[im 30/60  bone]
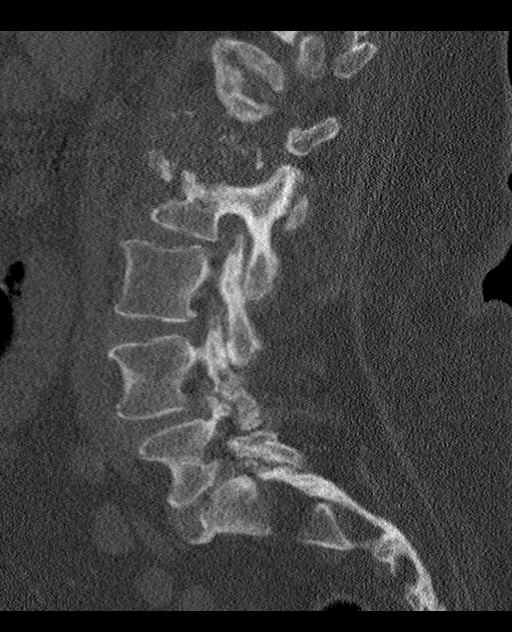
[im 35/60  bone]
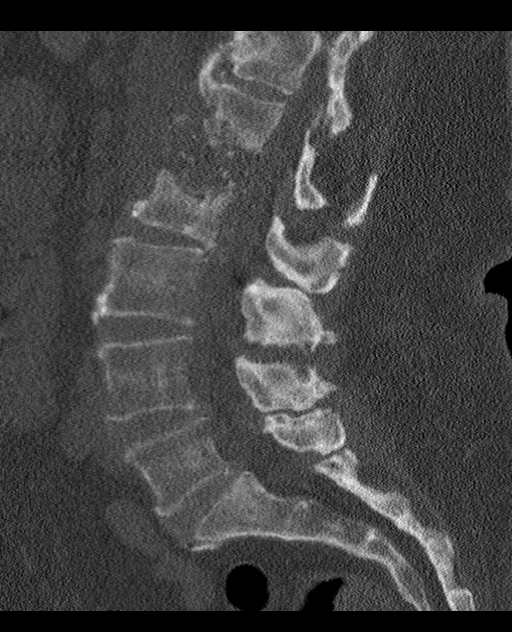
[im 40/60  bone]
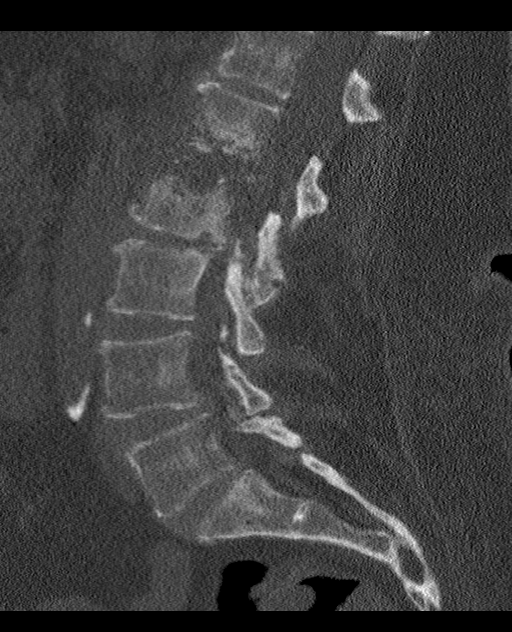

[Series 6: coronal bone · coronal · 0.30mm/px · 3 of 73 slices shown]
[im 15/73  bone]
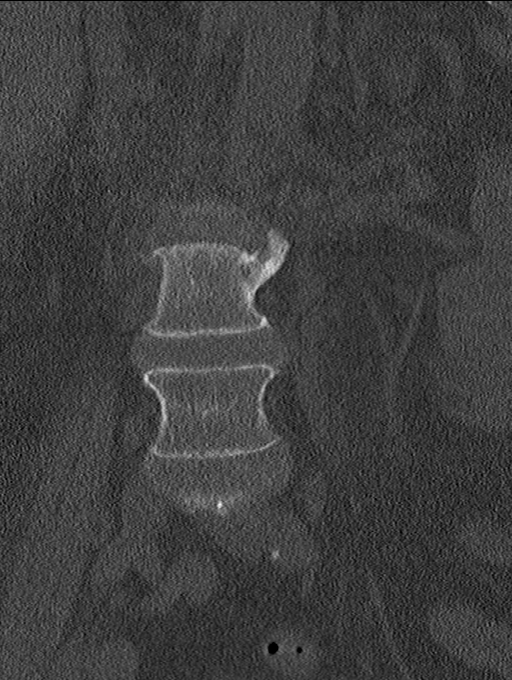
[im 29/73  bone]
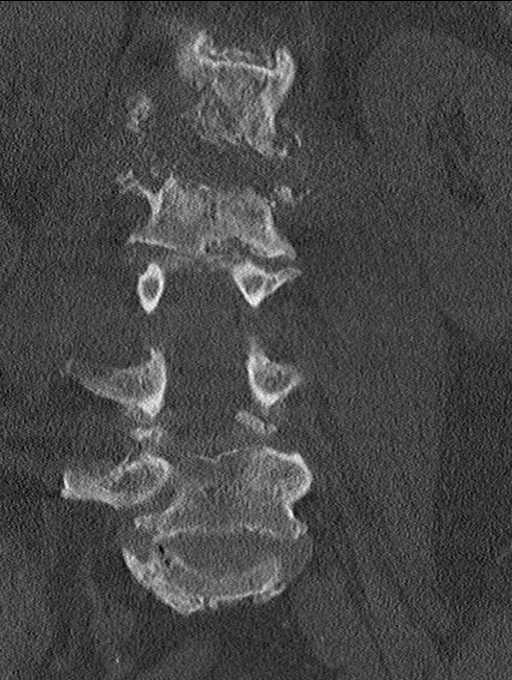
[im 44/73  bone]
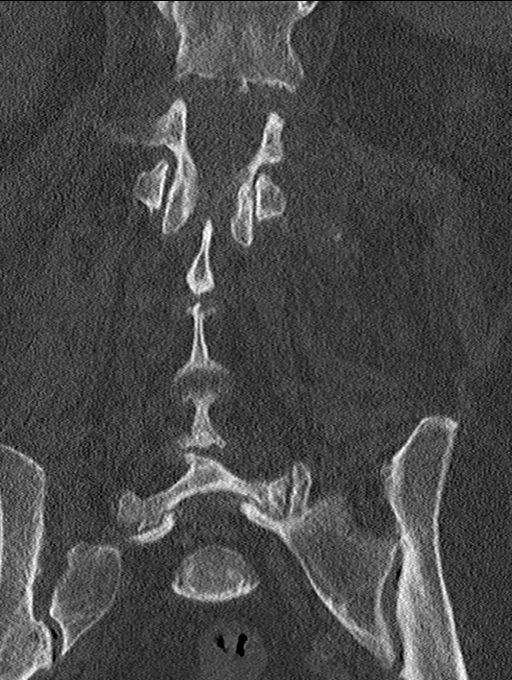

[14 of 33 positions shown; findings below may reference images not displayed]

FINDINGS: Segmentation: 5 lumbar type vertebrae.

Alignment: Unchanged grade 1 anterolisthesis at L5-S1.

Vertebrae: There is severe endplate erosion at L1-L2, markedly
progressed from the prior CT 05/31/2018. Comparison to the MRI of
07/11/2018 is limited by the difference in modality, but the degree
of osseous destruction is approximately unchanged.

Paraspinal and other soft tissues: There is paravertebral phlegmon
at the L1-L2 levels with a soft tissue component extending into the
right ventral epidural space ([DATE]).

Disc levels:

At L1-2, there is moderate attenuation of the thecal sac due to
compression by ventral epidural collection. There is severe
narrowing of both neural foramina, unchanged.

There is severe L4-5 and L5-S1 facet arthrosis.
IMPRESSION: 1. Severe L1-2 discitis-osteomyelitis with unchanged degree of
endplate and vertebral body destruction compared to 07/11/2018,
allowing for differences in modality.
2. Right ventral epidural collection at L1-2, moderately narrowing
the spinal canal and most consistent with epidural abscess, as
previously demonstrated.
3. L1-2 paravertebral phlegmon with edema of the psoas muscles. No
discrete fluid collection identified on this study.

## 2020-03-24 ENCOUNTER — Ambulatory Visit: Payer: Self-pay | Admitting: Adult Health

## 2020-04-08 DIAGNOSIS — R059 Cough, unspecified: Secondary | ICD-10-CM | POA: Diagnosis not present

## 2020-04-08 DIAGNOSIS — Z20828 Contact with and (suspected) exposure to other viral communicable diseases: Secondary | ICD-10-CM | POA: Diagnosis not present

## 2020-04-08 DIAGNOSIS — J40 Bronchitis, not specified as acute or chronic: Secondary | ICD-10-CM | POA: Diagnosis not present

## 2020-04-21 ENCOUNTER — Telehealth (INDEPENDENT_AMBULATORY_CARE_PROVIDER_SITE_OTHER): Payer: Medicare Other | Admitting: Adult Health

## 2020-04-21 DIAGNOSIS — G4733 Obstructive sleep apnea (adult) (pediatric): Secondary | ICD-10-CM | POA: Diagnosis not present

## 2020-04-21 DIAGNOSIS — Z9989 Dependence on other enabling machines and devices: Secondary | ICD-10-CM
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
## 2020-04-23 DIAGNOSIS — E119 Type 2 diabetes mellitus without complications: Secondary | ICD-10-CM | POA: Diagnosis not present

## 2020-05-06 DIAGNOSIS — E113392 Type 2 diabetes mellitus with moderate nonproliferative diabetic retinopathy without macular edema, left eye: Secondary | ICD-10-CM | POA: Diagnosis not present

## 2020-05-06 DIAGNOSIS — H35453 Secondary pigmentary degeneration, bilateral: Secondary | ICD-10-CM | POA: Diagnosis not present

## 2020-05-06 DIAGNOSIS — H43813 Vitreous degeneration, bilateral: Secondary | ICD-10-CM | POA: Diagnosis not present

## 2020-05-06 DIAGNOSIS — H35033 Hypertensive retinopathy, bilateral: Secondary | ICD-10-CM | POA: Diagnosis not present

## 2020-05-21 DIAGNOSIS — E1129 Type 2 diabetes mellitus with other diabetic kidney complication: Secondary | ICD-10-CM | POA: Diagnosis not present

## 2020-05-21 DIAGNOSIS — E039 Hypothyroidism, unspecified: Secondary | ICD-10-CM | POA: Diagnosis not present

## 2020-05-21 DIAGNOSIS — E538 Deficiency of other specified B group vitamins: Secondary | ICD-10-CM | POA: Diagnosis not present

## 2020-05-21 DIAGNOSIS — F339 Major depressive disorder, recurrent, unspecified: Secondary | ICD-10-CM | POA: Diagnosis not present

## 2020-05-21 DIAGNOSIS — N1831 Chronic kidney disease, stage 3a: Secondary | ICD-10-CM | POA: Diagnosis not present

## 2020-05-21 DIAGNOSIS — D509 Iron deficiency anemia, unspecified: Secondary | ICD-10-CM | POA: Diagnosis not present

## 2020-05-21 DIAGNOSIS — D61818 Other pancytopenia: Secondary | ICD-10-CM | POA: Diagnosis not present

## 2020-05-21 DIAGNOSIS — I10 Essential (primary) hypertension: Secondary | ICD-10-CM | POA: Diagnosis not present

## 2020-06-18 DIAGNOSIS — E1129 Type 2 diabetes mellitus with other diabetic kidney complication: Secondary | ICD-10-CM | POA: Diagnosis not present

## 2020-06-18 DIAGNOSIS — Z794 Long term (current) use of insulin: Secondary | ICD-10-CM | POA: Diagnosis not present

## 2020-06-18 DIAGNOSIS — I129 Hypertensive chronic kidney disease with stage 1 through stage 4 chronic kidney disease, or unspecified chronic kidney disease: Secondary | ICD-10-CM | POA: Diagnosis not present

## 2020-06-18 DIAGNOSIS — E785 Hyperlipidemia, unspecified: Secondary | ICD-10-CM | POA: Diagnosis not present

## 2020-06-18 DIAGNOSIS — G720 Drug-induced myopathy: Secondary | ICD-10-CM | POA: Diagnosis not present

## 2020-06-18 DIAGNOSIS — N1831 Chronic kidney disease, stage 3a: Secondary | ICD-10-CM | POA: Diagnosis not present

## 2020-06-18 DIAGNOSIS — I10 Essential (primary) hypertension: Secondary | ICD-10-CM | POA: Diagnosis not present

## 2020-06-24 DIAGNOSIS — H35033 Hypertensive retinopathy, bilateral: Secondary | ICD-10-CM | POA: Diagnosis not present

## 2020-06-24 DIAGNOSIS — H348122 Central retinal vein occlusion, left eye, stable: Secondary | ICD-10-CM | POA: Diagnosis not present

## 2020-06-24 DIAGNOSIS — H43813 Vitreous degeneration, bilateral: Secondary | ICD-10-CM | POA: Diagnosis not present

## 2020-06-24 DIAGNOSIS — E113392 Type 2 diabetes mellitus with moderate nonproliferative diabetic retinopathy without macular edema, left eye: Secondary | ICD-10-CM | POA: Diagnosis not present

## 2020-07-07 ENCOUNTER — Telehealth: Payer: Self-pay | Admitting: Oncology

## 2020-07-07 NOTE — Telephone Encounter (Signed)
R/s appt per 4/18 sch msg. Pt aware.  

## 2020-07-08 ENCOUNTER — Encounter (HOSPITAL_COMMUNITY): Payer: Medicare Other

## 2020-07-09 ENCOUNTER — Inpatient Hospital Stay: Payer: Medicare Other

## 2020-07-16 ENCOUNTER — Encounter (HOSPITAL_COMMUNITY): Payer: Medicare Other

## 2020-07-21 ENCOUNTER — Inpatient Hospital Stay: Payer: Medicare Other | Attending: Oncology

## 2020-07-21 ENCOUNTER — Other Ambulatory Visit: Payer: Self-pay | Admitting: Oncology

## 2020-07-21 ENCOUNTER — Telehealth: Payer: Self-pay | Admitting: Oncology

## 2020-07-21 ENCOUNTER — Other Ambulatory Visit: Payer: Self-pay

## 2020-07-21 DIAGNOSIS — Z862 Personal history of diseases of the blood and blood-forming organs and certain disorders involving the immune mechanism: Secondary | ICD-10-CM | POA: Insufficient documentation

## 2020-07-21 DIAGNOSIS — D61818 Other pancytopenia: Secondary | ICD-10-CM | POA: Diagnosis not present

## 2020-07-21 LAB — COMPREHENSIVE METABOLIC PANEL
ALT: 16 U/L (ref 0–44)
AST: 23 U/L (ref 15–41)
Albumin: 3.9 g/dL (ref 3.5–5.0)
Alkaline Phosphatase: 83 U/L (ref 38–126)
Anion gap: 10 (ref 5–15)
BUN: 28 mg/dL — ABNORMAL HIGH (ref 8–23)
CO2: 26 mmol/L (ref 22–32)
Calcium: 9.6 mg/dL (ref 8.9–10.3)
Chloride: 103 mmol/L (ref 98–111)
Creatinine, Ser: 1.15 mg/dL — ABNORMAL HIGH (ref 0.44–1.00)
GFR, Estimated: 52 mL/min — ABNORMAL LOW (ref >60)
Glucose, Bld: 188 mg/dL — ABNORMAL HIGH (ref 70–99)
Potassium: 4.8 mmol/L (ref 3.5–5.1)
Sodium: 139 mmol/L (ref 135–145)
Total Bilirubin: 0.3 mg/dL (ref 0.3–1.2)
Total Protein: 7.2 g/dL (ref 6.5–8.1)

## 2020-07-21 LAB — CBC WITH DIFFERENTIAL/PLATELET
Abs Immature Granulocytes: 0 10*3/uL (ref 0.00–0.07)
Basophils Absolute: 0 10*3/uL (ref 0.0–0.1)
Basophils Relative: 0 %
Eosinophils Absolute: 0 10*3/uL (ref 0.0–0.5)
Eosinophils Relative: 0 %
HCT: 33.2 % — ABNORMAL LOW (ref 36.0–46.0)
Hemoglobin: 10.8 g/dL — ABNORMAL LOW (ref 12.0–15.0)
Immature Granulocytes: 0 %
Lymphocytes Relative: 40 %
Lymphs Abs: 0.9 10*3/uL (ref 0.7–4.0)
MCH: 26.9 pg (ref 26.0–34.0)
MCHC: 32.5 g/dL (ref 30.0–36.0)
MCV: 82.8 fL (ref 80.0–100.0)
Monocytes Absolute: 0.2 10*3/uL (ref 0.1–1.0)
Monocytes Relative: 10 %
Neutro Abs: 1 10*3/uL — ABNORMAL LOW (ref 1.7–7.7)
Neutrophils Relative %: 50 %
Platelets: 123 10*3/uL — ABNORMAL LOW (ref 150–400)
RBC: 4.01 MIL/uL (ref 3.87–5.11)
RDW: 13.7 % (ref 11.5–15.5)
WBC: 2.1 10*3/uL — ABNORMAL LOW (ref 4.0–10.5)
nRBC: 0 % (ref 0.0–0.2)

## 2020-07-21 LAB — IRON AND TIBC
Iron: 58 ug/dL (ref 28–170)
Saturation Ratios: 14 % (ref 10.4–31.8)
TIBC: 405 ug/dL (ref 250–450)
UIBC: 347 ug/dL

## 2020-07-21 LAB — RETICULOCYTES
Immature Retic Fract: 15.4 % (ref 2.3–15.9)
RBC.: 4.03 MIL/uL (ref 3.87–5.11)
Retic Count, Absolute: 69.3 10*3/uL (ref 19.0–186.0)
Retic Ct Pct: 1.7 % (ref 0.4–3.1)

## 2020-07-21 LAB — FERRITIN: Ferritin: 18 ng/mL (ref 11–307)

## 2020-07-21 LAB — SAVE SMEAR(SSMR), FOR PROVIDER SLIDE REVIEW

## 2020-07-21 NOTE — Progress Notes (Signed)
[name]'s ferritin and iron saturation are a bit lower but not yet in clear deficiency range.  I am going to recheck her counts in mid July.

## 2020-07-21 NOTE — Telephone Encounter (Signed)
Scheduled appt per 5/2 sch msg. Pt aware.  

## 2020-07-29 ENCOUNTER — Ambulatory Visit: Payer: Medicare Other | Admitting: Internal Medicine

## 2020-08-20 ENCOUNTER — Ambulatory Visit: Payer: Medicare Other | Admitting: Internal Medicine

## 2020-08-25 ENCOUNTER — Other Ambulatory Visit (HOSPITAL_COMMUNITY): Payer: Self-pay | Admitting: Internal Medicine

## 2020-08-25 ENCOUNTER — Ambulatory Visit (HOSPITAL_COMMUNITY)
Admission: RE | Admit: 2020-08-25 | Discharge: 2020-08-25 | Disposition: A | Discharge status: Home / Self Care | Payer: Medicare Other | Source: Ambulatory Visit | Attending: Internal Medicine | Admitting: Internal Medicine

## 2020-08-25 ENCOUNTER — Other Ambulatory Visit: Payer: Self-pay

## 2020-08-25 DIAGNOSIS — I6529 Occlusion and stenosis of unspecified carotid artery: Secondary | ICD-10-CM

## 2020-09-02 ENCOUNTER — Other Ambulatory Visit: Payer: Self-pay

## 2020-09-02 ENCOUNTER — Ambulatory Visit (INDEPENDENT_AMBULATORY_CARE_PROVIDER_SITE_OTHER): Payer: Medicare Other | Admitting: Internal Medicine

## 2020-09-02 DIAGNOSIS — I6529 Occlusion and stenosis of unspecified carotid artery: Secondary | ICD-10-CM | POA: Diagnosis not present

## 2020-09-02 DIAGNOSIS — M4646 Discitis, unspecified, lumbar region: Secondary | ICD-10-CM

## 2020-09-02 MED ORDER — DOXYCYCLINE HYCLATE 100 MG PO TABS: 100.0000 mg | ORAL_TABLET | Freq: Two times a day (BID) | ORAL | 11 refills | Status: DC
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
## 2020-09-02 NOTE — Assessment & Plan Note (Signed)
She is aware that there is a good chance that her infection has been cured but she is still not interested in stopping suppressive doxycycline.  She does not want to take any chance of her infection recurring.  She will get repeat blood work today and follow-up by phone in 6 months.

## 2020-09-03 LAB — C-REACTIVE PROTEIN: CRP: 7.2 mg/L (ref <8.0)

## 2020-09-03 LAB — SEDIMENTATION RATE: Sed Rate: 28 mm/h (ref 0–30)

## 2020-09-16 DIAGNOSIS — E559 Vitamin D deficiency, unspecified: Secondary | ICD-10-CM | POA: Diagnosis not present

## 2020-09-16 DIAGNOSIS — E1169 Type 2 diabetes mellitus with other specified complication: Secondary | ICD-10-CM | POA: Diagnosis not present

## 2020-09-16 DIAGNOSIS — E538 Deficiency of other specified B group vitamins: Secondary | ICD-10-CM | POA: Diagnosis not present

## 2020-09-16 DIAGNOSIS — E0590 Thyrotoxicosis, unspecified without thyrotoxic crisis or storm: Secondary | ICD-10-CM | POA: Diagnosis not present

## 2020-09-16 DIAGNOSIS — E039 Hypothyroidism, unspecified: Secondary | ICD-10-CM | POA: Diagnosis not present

## 2020-09-16 DIAGNOSIS — E785 Hyperlipidemia, unspecified: Secondary | ICD-10-CM | POA: Diagnosis not present

## 2020-09-23 DIAGNOSIS — E039 Hypothyroidism, unspecified: Secondary | ICD-10-CM | POA: Diagnosis not present

## 2020-09-23 DIAGNOSIS — I129 Hypertensive chronic kidney disease with stage 1 through stage 4 chronic kidney disease, or unspecified chronic kidney disease: Secondary | ICD-10-CM | POA: Diagnosis not present

## 2020-09-23 DIAGNOSIS — A4902 Methicillin resistant Staphylococcus aureus infection, unspecified site: Secondary | ICD-10-CM | POA: Diagnosis not present

## 2020-09-23 DIAGNOSIS — E785 Hyperlipidemia, unspecified: Secondary | ICD-10-CM | POA: Diagnosis not present

## 2020-09-23 DIAGNOSIS — D509 Iron deficiency anemia, unspecified: Secondary | ICD-10-CM | POA: Diagnosis not present

## 2020-09-23 DIAGNOSIS — R82998 Other abnormal findings in urine: Secondary | ICD-10-CM | POA: Diagnosis not present

## 2020-09-23 DIAGNOSIS — E1169 Type 2 diabetes mellitus with other specified complication: Secondary | ICD-10-CM | POA: Diagnosis not present

## 2020-09-23 DIAGNOSIS — Z794 Long term (current) use of insulin: Secondary | ICD-10-CM | POA: Diagnosis not present

## 2020-09-23 DIAGNOSIS — Z23 Encounter for immunization: Secondary | ICD-10-CM | POA: Diagnosis not present

## 2020-09-23 DIAGNOSIS — Z0000 Encounter for general adult medical examination without abnormal findings: Secondary | ICD-10-CM | POA: Diagnosis not present

## 2020-09-23 DIAGNOSIS — E1129 Type 2 diabetes mellitus with other diabetic kidney complication: Secondary | ICD-10-CM | POA: Diagnosis not present

## 2020-09-23 DIAGNOSIS — E0590 Thyrotoxicosis, unspecified without thyrotoxic crisis or storm: Secondary | ICD-10-CM | POA: Diagnosis not present

## 2020-09-30 ENCOUNTER — Inpatient Hospital Stay: Payer: Medicare Other

## 2020-10-01 ENCOUNTER — Other Ambulatory Visit: Payer: Self-pay | Admitting: Internal Medicine

## 2020-10-01 DIAGNOSIS — E785 Hyperlipidemia, unspecified: Secondary | ICD-10-CM

## 2020-10-03 DIAGNOSIS — M47812 Spondylosis without myelopathy or radiculopathy, cervical region: Secondary | ICD-10-CM | POA: Diagnosis not present

## 2020-10-03 DIAGNOSIS — M542 Cervicalgia: Secondary | ICD-10-CM | POA: Diagnosis not present

## 2020-10-03 DIAGNOSIS — S199XXA Unspecified injury of neck, initial encounter: Secondary | ICD-10-CM | POA: Diagnosis not present

## 2020-10-03 DIAGNOSIS — S0101XA Laceration without foreign body of scalp, initial encounter: Secondary | ICD-10-CM | POA: Diagnosis not present

## 2020-10-08 ENCOUNTER — Other Ambulatory Visit: Payer: Self-pay

## 2020-10-08 ENCOUNTER — Inpatient Hospital Stay: Payer: Medicare Other | Attending: Oncology

## 2020-10-08 DIAGNOSIS — D509 Iron deficiency anemia, unspecified: Secondary | ICD-10-CM | POA: Insufficient documentation

## 2020-10-08 DIAGNOSIS — D61818 Other pancytopenia: Secondary | ICD-10-CM | POA: Diagnosis not present

## 2020-10-08 LAB — CBC WITH DIFFERENTIAL/PLATELET
Abs Immature Granulocytes: 0 10*3/uL (ref 0.00–0.07)
Basophils Absolute: 0 10*3/uL (ref 0.0–0.1)
Basophils Relative: 0 %
Eosinophils Absolute: 0 10*3/uL (ref 0.0–0.5)
Eosinophils Relative: 0 %
HCT: 31.3 % — ABNORMAL LOW (ref 36.0–46.0)
Hemoglobin: 10.3 g/dL — ABNORMAL LOW (ref 12.0–15.0)
Immature Granulocytes: 0 %
Lymphocytes Relative: 41 %
Lymphs Abs: 0.7 10*3/uL (ref 0.7–4.0)
MCH: 27 pg (ref 26.0–34.0)
MCHC: 32.9 g/dL (ref 30.0–36.0)
MCV: 81.9 fL (ref 80.0–100.0)
Monocytes Absolute: 0.2 10*3/uL (ref 0.1–1.0)
Monocytes Relative: 11 %
Neutro Abs: 0.8 10*3/uL — ABNORMAL LOW (ref 1.7–7.7)
Neutrophils Relative %: 48 %
Platelets: 115 10*3/uL — ABNORMAL LOW (ref 150–400)
RBC: 3.82 MIL/uL — ABNORMAL LOW (ref 3.87–5.11)
RDW: 13.9 % (ref 11.5–15.5)
WBC: 1.7 10*3/uL — ABNORMAL LOW (ref 4.0–10.5)
nRBC: 0 % (ref 0.0–0.2)

## 2020-10-08 LAB — CMP (CANCER CENTER ONLY)
ALT: 18 U/L (ref 0–44)
AST: 21 U/L (ref 15–41)
Albumin: 4.1 g/dL (ref 3.5–5.0)
Alkaline Phosphatase: 78 U/L (ref 38–126)
Anion gap: 12 (ref 5–15)
BUN: 26 mg/dL — ABNORMAL HIGH (ref 8–23)
CO2: 27 mmol/L (ref 22–32)
Calcium: 9.8 mg/dL (ref 8.9–10.3)
Chloride: 103 mmol/L (ref 98–111)
Creatinine: 0.9 mg/dL (ref 0.44–1.00)
GFR, Estimated: >60 mL/min (ref >60)
Glucose, Bld: 229 mg/dL — ABNORMAL HIGH (ref 70–99)
Potassium: 4.7 mmol/L (ref 3.5–5.1)
Sodium: 142 mmol/L (ref 135–145)
Total Bilirubin: 0.5 mg/dL (ref 0.3–1.2)
Total Protein: 7.4 g/dL (ref 6.5–8.1)

## 2020-10-08 LAB — RETICULOCYTES
Immature Retic Fract: 20.7 % — ABNORMAL HIGH (ref 2.3–15.9)
RBC.: 3.81 MIL/uL — ABNORMAL LOW (ref 3.87–5.11)
Retic Count, Absolute: 75.4 10*3/uL (ref 19.0–186.0)
Retic Ct Pct: 2 % (ref 0.4–3.1)

## 2020-10-08 LAB — IRON AND TIBC
Iron: 46 ug/dL (ref 41–142)
Saturation Ratios: 13 % — ABNORMAL LOW (ref 21–57)
TIBC: 349 ug/dL (ref 236–444)
UIBC: 303 ug/dL (ref 120–384)

## 2020-10-08 LAB — FERRITIN: Ferritin: 21 ng/mL (ref 11–307)

## 2020-10-08 LAB — SAVE SMEAR(SSMR), FOR PROVIDER SLIDE REVIEW

## 2020-10-13 ENCOUNTER — Other Ambulatory Visit: Payer: Self-pay | Admitting: Oncology

## 2020-10-13 NOTE — Progress Notes (Unsigned)
I called [name] and let her know that her iron is still on the low range.  I think she would feel better if she had some replacement.  She is agreeable.  She is aware of the possible toxicities side effects and complications of intravenous iron.  We are going to go with Venofer on 10/23/2020 and weekly x3

## 2020-10-14 ENCOUNTER — Telehealth: Payer: Self-pay | Admitting: Oncology

## 2020-10-14 NOTE — Telephone Encounter (Signed)
Scheduled appts per 7/25 sch msg. Pt aware.  

## 2020-10-20 ENCOUNTER — Other Ambulatory Visit: Payer: Medicare Other

## 2020-10-23 ENCOUNTER — Inpatient Hospital Stay: Payer: Medicare Other | Attending: Oncology

## 2020-10-23 ENCOUNTER — Other Ambulatory Visit: Payer: Self-pay

## 2020-10-23 VITALS — BP 123/57 | HR 55 | Temp 98.2°F | Resp 18

## 2020-10-23 DIAGNOSIS — D61818 Other pancytopenia: Secondary | ICD-10-CM | POA: Diagnosis not present

## 2020-10-23 DIAGNOSIS — Z79899 Other long term (current) drug therapy: Secondary | ICD-10-CM | POA: Insufficient documentation

## 2020-10-23 DIAGNOSIS — D508 Other iron deficiency anemias: Secondary | ICD-10-CM

## 2020-10-23 MED ORDER — SODIUM CHLORIDE 0.9 % IV SOLN
300.0000 mg | Freq: Once | INTRAVENOUS | Status: AC
  Administered 2020-10-23: 300 mg via INTRAVENOUS
  Filled 2020-10-23: qty 300

## 2020-10-23 MED ORDER — SODIUM CHLORIDE 0.9 % IV SOLN
Freq: Once | INTRAVENOUS | Status: AC
  Filled 2020-10-23: qty 250

## 2020-10-23 NOTE — Progress Notes (Signed)
Pt stayed for full 30 min post iron infusion. Tolerated well, no questions at time of discharge. IV removed intact and pt discharged in stable condition, ambulatory to lobby with walker.

## 2020-10-23 NOTE — Patient Instructions (Signed)

## 2020-10-30 ENCOUNTER — Other Ambulatory Visit: Payer: Self-pay

## 2020-10-30 ENCOUNTER — Other Ambulatory Visit: Payer: Medicare Other

## 2020-10-30 ENCOUNTER — Inpatient Hospital Stay: Payer: Medicare Other

## 2020-10-30 VITALS — BP 146/53 | HR 62 | Temp 97.8°F | Resp 16

## 2020-10-30 DIAGNOSIS — D508 Other iron deficiency anemias: Secondary | ICD-10-CM

## 2020-10-30 DIAGNOSIS — D61818 Other pancytopenia: Secondary | ICD-10-CM | POA: Diagnosis not present

## 2020-10-30 DIAGNOSIS — Z79899 Other long term (current) drug therapy: Secondary | ICD-10-CM | POA: Diagnosis not present

## 2020-10-30 MED ORDER — SODIUM CHLORIDE 0.9 % IV SOLN
INTRAVENOUS | Status: DC
  Filled 2020-10-30: qty 250

## 2020-10-30 MED ORDER — SODIUM CHLORIDE 0.9 % IV SOLN
300.0000 mg | Freq: Once | INTRAVENOUS | Status: AC
  Administered 2020-10-30: 300 mg via INTRAVENOUS
  Filled 2020-10-30: qty 300

## 2020-10-30 NOTE — Patient Instructions (Signed)

## 2020-11-06 ENCOUNTER — Other Ambulatory Visit: Payer: Self-pay

## 2020-11-06 ENCOUNTER — Inpatient Hospital Stay: Payer: Medicare Other

## 2020-11-06 VITALS — BP 131/47 | HR 59 | Temp 99.0°F | Resp 16

## 2020-11-06 DIAGNOSIS — Z79899 Other long term (current) drug therapy: Secondary | ICD-10-CM | POA: Diagnosis not present

## 2020-11-06 DIAGNOSIS — D61818 Other pancytopenia: Secondary | ICD-10-CM | POA: Diagnosis not present

## 2020-11-06 DIAGNOSIS — D508 Other iron deficiency anemias: Secondary | ICD-10-CM

## 2020-11-06 DIAGNOSIS — Z780 Asymptomatic menopausal state: Secondary | ICD-10-CM | POA: Diagnosis not present

## 2020-11-06 MED ORDER — SODIUM CHLORIDE 0.9 % IV SOLN
300.0000 mg | Freq: Once | INTRAVENOUS | Status: AC
  Administered 2020-11-06: 300 mg via INTRAVENOUS
  Filled 2020-11-06: qty 300

## 2020-11-06 MED ORDER — SODIUM CHLORIDE 0.9 % IV SOLN: Freq: Once | INTRAVENOUS | Status: AC

## 2020-11-06 NOTE — Progress Notes (Signed)
Pt declined to stay for 30 min observation post venofer infusion. VSS and IV removed intact. Pt had no complaints and was discharged in stable condition. Pt agreeable to call with any questions or concerns.

## 2020-11-06 NOTE — Patient Instructions (Signed)

## 2020-11-11 DIAGNOSIS — H35372 Puckering of macula, left eye: Secondary | ICD-10-CM | POA: Diagnosis not present

## 2020-11-11 DIAGNOSIS — H35033 Hypertensive retinopathy, bilateral: Secondary | ICD-10-CM | POA: Diagnosis not present

## 2020-11-11 DIAGNOSIS — E113392 Type 2 diabetes mellitus with moderate nonproliferative diabetic retinopathy without macular edema, left eye: Secondary | ICD-10-CM | POA: Diagnosis not present

## 2020-11-11 DIAGNOSIS — H348122 Central retinal vein occlusion, left eye, stable: Secondary | ICD-10-CM | POA: Diagnosis not present

## 2020-11-11 DIAGNOSIS — H43813 Vitreous degeneration, bilateral: Secondary | ICD-10-CM | POA: Diagnosis not present

## 2020-11-17 ENCOUNTER — Encounter: Payer: Self-pay | Admitting: Adult Health

## 2020-11-17 ENCOUNTER — Ambulatory Visit
Admission: RE | Admit: 2020-11-17 | Discharge: 2020-11-17 | Disposition: A | Discharge status: Home / Self Care | Payer: No Typology Code available for payment source | Source: Ambulatory Visit | Attending: Internal Medicine | Admitting: Internal Medicine

## 2020-11-17 ENCOUNTER — Other Ambulatory Visit: Payer: Self-pay

## 2020-11-17 DIAGNOSIS — E785 Hyperlipidemia, unspecified: Secondary | ICD-10-CM

## 2020-11-24 DIAGNOSIS — Z20822 Contact with and (suspected) exposure to covid-19: Secondary | ICD-10-CM | POA: Diagnosis not present

## 2020-12-26 DIAGNOSIS — Z23 Encounter for immunization: Secondary | ICD-10-CM | POA: Diagnosis not present

## 2021-01-07 NOTE — Progress Notes (Signed)
[place]  [phone]) [phone] [phone]) [phone]     ID: SHAQUALA BROEKER DOB: 03-05-1951  MR#: [id number]  IOX#:[id number]  Patient Care Team: Crist Infante, MD as PCP - General (Internal Medicine) Raynelle Bring, MD as Consulting Physician (Urology) Milus Banister, MD as Attending Physician (Gastroenterology) Michel Bickers, MD as Consulting Physician (Infectious Diseases) Ashantee Deupree, Virgie Dad, MD as Consulting Physician (Oncology) Chauncey Cruel, MD OTHER MD:  CHIEF COMPLAINT: Iron deficiency anemia, splenomegaly  CURRENT TREATMENT: IV iron when needed; observation   INTERVAL HISTORY: Blaize returns today for follow up of her pancytopenia and history of iron deficiency.  She receives IV iron as needed. She most recently received three doses in 10/2020.  She tolerated this with no side effects that she is aware of  She had upper endoscopy and colonoscopy June 2021 under [facility].  This shows some diverticular disease.  She is also status post gastric sleeve surgery  She also has splenomegaly which likely accounts for her low white cell count and slightly low platelet count.  Her most recent counts are stable: Results for TALEAH, BELLANTONI (MRN [id number]) as of 01/08/2021 13:30  Ref. Range 07/21/2020 09:55 10/08/2020 10:19 01/08/2021 13:05  Hemoglobin Latest Ref Range: 12.0 - 15.0 g/dL 10.8 (L) 10.3 (L) 11.3 (L)    REVIEW OF SYSTEMS: Zyla is not exercising because she has significant knee and leg problems.  She spends about half the year in [place] and half the year at [facility] with her grandchildren.  She and her husband enjoy fishing and watching the kids sports.  She is behind on mammography.  A detailed review of systems today was otherwise stable   COVID 19 VACCINATION STATUS: Pfizer x3, status post COVID November 2021   HISTORY OF CURRENT ILLNESS: From the original consult note:  Tailynn is being seen today at the request of Dr. Joylene Draft for  her pancytopenia.  She sought an urgent appointment with him back in 05/2019 due to falling asleep easily.  This revealed that she had decreased WBC, HGB, and Plt counts.  Her WBC was 2.2 with an ANC of 1.1; Her hemoglobin 9 with an MCV of 81.4; and her platelet count of 138.  Her creatinine at that time was 1.1.  In 09/2018 her CBC was similar, however that was following/during a MRSA abscess hospitalization and recovery time period.  In reviewing her available labs in Epic, she appears to have a longstanding anemia, more recently microcytic see below.  She is taking b12 supplementation for b12 deficiency.   Ref. Range 12/18/2007 09:33 12/27/2007 03:40 08/15/2012 11:51 12/10/2013 09:00 07/21/2014 07:00 06/25/2015 11:45 01/06/2017 13:15 07/13/2017 10:40 10/11/2018 16:09 07/10/2019 08:49  Hemoglobin Latest Ref Range: 12.0 - 15.0 g/dL 12.3 11.4 (L) 10.9 (L) 9.9 (L) 10.9 (L) 9.9 (L) 9.2 (L) 11.7 (L) 9.6 (L) 8.3 (L)    Ref. Range 12/18/2007 09:33 12/27/2007 03:40 08/15/2012 11:51 12/10/2013 09:00 07/21/2014 07:00 06/25/2015 11:45 01/06/2017 13:15 07/13/2017 10:40 10/11/2018 16:09 07/10/2019 08:49  MCV Latest Ref Range: 80.0 - 100.0 fL 86.3 86.4 86.2 87.8 88.3 86.1 83.1 85.0 79.4 (L) 77.3 (L)   Her WBC and platelets have been as follows:    Ref. Range 12/18/2007 09:33 12/27/2007 03:40 08/15/2012 11:51 12/10/2013 09:00 07/21/2014 07:00 06/25/2015 11:45 01/06/2017 13:15 07/13/2017 10:40 10/11/2018 16:09 07/10/2019 08:49  WBC Latest Ref Range: 4.0 - 10.5 K/uL 4.2 6.7 4.1 5.6 3.5 (L) 9.7 4.2 2.7 (L) 2.1 (L) 2.3 (L)    Ref. Range 12/18/2007 09:33 12/27/2007 03:40  08/15/2012 11:51 12/10/2013 09:00 07/21/2014 07:00 06/25/2015 11:45 01/06/2017 13:15 07/13/2017 10:40 10/11/2018 16:09 07/10/2019 08:49  Platelets Latest Ref Range: 150 - 400 K/uL 202 197 163 194 144 (L) 156 257 158 173 139 (L)   The patient's subsequent history is as detailed below.   PAST MEDICAL HISTORY: Past Medical History:  Diagnosis Date   Anemia    Arthritis    Asthma    OCCAS    Bell's palsy    Broken neck (HCC)    C-1   Carbuncle and furuncle of trunk    Chronic dislocation of right shoulder    Depression    Disorder of fascia    HX OF NECROTIC FASCITIS AFTER ABDOMINAL SURGERY FOR HERNIA- REQUIRED 19 SURGERIES AND 2.5 MONTH HOSPITALIZATION AT [facility]   DM (diabetes mellitus) ([place])    Dysrhythmia    HX OF TACHYCARDIA AND BRADYCARDIA - ON GOING FOR YEARS - DOES NOT HAVE TO SEE CARDIOLOGIST   Elevated cholesterol    Fibromyalgia    Fracture MAY 2014   HX OF FRACTURED NECK C1- CAUSES SEVERE HEADACHES--LIMITED ROM NECK   Frequent infections    ESPECIALLY PRONE TO INFECTIONS AFTER SURGERIES   Graves disease    Heart murmur    DOES NOT CAUSE ANY PROBLEMS   History of kidney stones    Hyperlipidemia    Hypertension    Hypothyroidism    GRAVES DISEASE   Migraine    Nephrolithiasis    STAGE 3    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   Neuropathy    Obesity    OSA (obstructive sleep apnea)    USES CPAP - DOES NOT KNOW SETTING   Pain    LEFT SHOULDER  PAIN -HARD TO LIE ON LEFT SIDE FOR LONG PERIOD;  PAIN IN LOWER BADK - 3 HERNIATED DISCS AND STENOISIS -   PONV (postoperative nausea and vomiting)    THE GAS MAKES ME NAUSEATED   Restless leg syndrome    Sciatica    Tachycardia    Urinary frequency    Urticaria    UTI (urinary tract infection)     PAST SURGICAL HISTORY: Past Surgical History:  Procedure Laterality Date   ABDOMINAL HYSTERECTOMY     LARGE TUMOR AT OVARY REMOVED   ANTERIOR LAT LUMBAR FUSION N/A 07/28/2018   Procedure: Anterolateral Decompression Lumbar One-Two for osteomyelitis reconstruction w/titanium strut allograft Fusion Lumbar One-Two;  Surgeon: Kristeen Miss, MD;  Location: [facility];  Service: Neurosurgery;  Laterality: N/A;  Left anterolateral approach   APPENDECTOMY     APPLICATION OF ROBOTIC ASSISTANCE FOR SPINAL PROCEDURE N/A 07/28/2018   Procedure: APPLICATION OF ROBOTIC ASSISTANCE FOR SPINAL PROCEDURE;  Surgeon: Kristeen Miss, MD;   Location: [facility];  Service: Neurosurgery;  Laterality: N/A;   CARPAL TUNNEL RELEASE     BILATERAL   CATARACT EXTRACTION W/ INTRAOCULAR LENS  IMPLANT, BILATERAL     CESAREAN SECTION     X 3   CHOLECYSTECTOMY     CYSTOSCOPY WITH URETEROSCOPY Right 12/03/2013   Procedure: CYSTOSCOPY WITH RIGHT RETROGRADE URETEROSCOPY LASER LITHOTRIPSY RIGHT STONE RIGHT URETERAL STENT, ;  Surgeon: Raynelle Bring, MD;  Location: [facility];  Service: Urology;  Laterality: Right;  PROCEDURE WAS ORIGINALLY SCHEDULED AS RIGHT PERCUTANEOUS NEPHROLITHOTOMY   EYELID LACERATION REPAIR     RIGHT EYE   HERNIA REPAIR     ABDOMINAL HERNIA REPAIR WITH MESH - 3 SURGERIES    HX OF 19 SURGERIES FOR NECROTIC FASCITIS     SKIN  GRAFTS+ WOUND  VAC   I&D OF INFECTED SITE IN [facility] - FROM AN INJECTION     IR FLUORO GUIDED NEEDLE PLC ASPIRATION/INJECTION LOC  06/03/2018   JOINT REPLACEMENT     TOTAL RIGHT KNEE REPLACEMENT   KNEE ARTHROSCOPY  RIGHT AND LEFT   X 2   LAPAROSCOPIC GASTRIC SLEEVE RESECTION  2014   NEPHROLITHOTOMY Right 12/10/2013   Procedure: NEPHROLITHOTOMY PERCUTANEOUS;  Surgeon: Raynelle Bring, MD;  Location: [facility];  Service: Urology;  Laterality: Right;   POSTERIOR LUMBAR FUSION 4 LEVEL N/A 07/28/2018   Procedure: Posterior Fixation Thoracic Ten-Lumbar Four with pedicle augmentaion with robotic assistance;  Surgeon: Kristeen Miss, MD;  Location: [facility];  Service: Neurosurgery;  Laterality: N/A;  Posterior Fixation Thoracic Ten-Lumbar Four with pedicle augmentaion with robotic assistance   REVERSE SHOULDER ARTHROPLASTY Right 07/21/2017   REVERSE SHOULDER ARTHROPLASTY Right 07/21/2017   Procedure: RIGHT REVERSE SHOULDER ARTHROPLASTY;  Surgeon: Tania Ade, MD;  Location: [facility];  Service: Orthopedics;  Laterality: Right;   RIGHT FOOT DRAINAGE OF INFECTION     shoulder arthroscopy Right    X 2   TONSILLECTOMY     AND ADENOIDECTOMY    FAMILY HISTORY Family History  Problem Relation Age of Onset   Diabetes Father     Osteoarthritis Father    Heart disease Father    Ulcers Father    Stroke Mother    Bladder Cancer Mother    Suicidality Sister    Colon cancer Neg Hx    Stomach cancer Neg Hx    Rectal cancer Neg Hx    Esophageal cancer Neg Hx     GYNECOLOGIC HISTORY:  No LMP recorded. Patient has had a hysterectomy. Menarche: 70 years old Age at first live birth: 70 years old GX P 3 LMP 40 years ago Contraceptive 1 year OCP HRT 5 years Hysterectomy? At age 28, still has cervix Salpingo-oophorectomy? At age 87, bilaterally   SOCIAL HISTORY:  Jannetta is married and lives with her husband and her son in [place], [place].  She is retired, however due to her multiple health issues has been on disability as well.  She has two other sons who also live in [place], [place].      ADVANCED DIRECTIVES: In the absence of any documentation to the contrary, the patient's spouse is their HCPOA.    HEALTH MAINTENANCE: Social History   Tobacco Use   Smoking status: Never   Smokeless tobacco: Never  Vaping Use   Vaping Use: Never used  Substance Use Topics   Alcohol use: No   Drug use: No     Colonoscopy: 11 years ago  PAP: many years ago, says still has cervix  Bone density: unknown  Mammogram: many years ago, cant recall when   Allergies  Allergen Reactions   Imitrex [Sumatriptan] Other (See Comments)    Vascular spasms   Nsaids Other (See Comments)    PT UNABLE TO TOLERATE NSAID'S DRUGS DUE TO HX OF GASTRIC SLEEVE SURGERY   Rosuvastatin Calcium Other (See Comments)    Leg/muscle pain   Statins Other (See Comments)    Leg/muscle pain   Sulfonamide Derivatives Hives    Childhood allergy    Codeine Nausea Only   Promethazine Hcl Other (See Comments)    Restless leg feeling all over body   Vicodin [Hydrocodone-Acetaminophen] Nausea And Vomiting    Current Outpatient Medications  Medication Sig Dispense Refill   amLODipine (NORVASC) 5 MG tablet Take 5 mg by mouth daily.  ARIPiprazole  (ABILIFY) 5 MG tablet Take 1 tablet (5 mg total) by mouth daily. 30 tablet 1   BD INSULIN SYRINGE U/F 31G X 5/16" 0.5 ML MISC USE 5 TIMES A DAY LEVEMIR AND NOVOLOG     BD PEN NEEDLE MICRO U/F 32G X 6 MM MISC Inject 32 pens into the skin daily.     Cholecalciferol (VITAMIN D3 PO) Take 1 tablet by mouth daily.      clonazePAM (KLONOPIN) 0.5 MG tablet Take 0.5 mg by mouth at bedtime as needed for anxiety. Up to 3 times daily as needed for anxiety, take 1 tablet (0.5 mg) scheduled each night at bedtime.  0   Cyanocobalamin (VITAMIN B 12 PO) Take 1 tablet by mouth daily.      diclofenac Sodium (VOLTAREN) 1 % GEL Apply 1 g topically as needed.     docusate sodium (COLACE) 100 MG capsule Take 1 capsule (100 mg total) by mouth 2 (two) times daily. 30 capsule 0   doxazosin (CARDURA) 2 MG tablet      doxycycline (VIBRA-TABS) 100 MG tablet Take 1 tablet (100 mg total) by mouth 2 (two) times daily. 60 tablet 11   fenofibrate 54 MG tablet Take 54 mg by mouth daily with supper.  2   fluconazole (DIFLUCAN) 150 MG tablet Take 1 tablet by mouth daily for 3 days then once weekly. 6 tablet 2   FLUoxetine (PROZAC) 20 MG capsule Take 60 mg by mouth at bedtime.   3   FREESTYLE TEST STRIPS test strip USE STRIPS TO TEST BLOOD GLUCOSE BID     furosemide (LASIX) 20 MG tablet TK 2 TS PO QAM AND 1 T QPM     gabapentin (NEURONTIN) 300 MG capsule Take 1 capsule (300 mg total) by mouth 3 (three) times daily for 30 days. 90 capsule 0   insulin aspart (NOVOLOG) 100 UNIT/ML FlexPen Sliding scale dose: CBG < 70: implement hypoglycemia protocol  CBG 70 - 120: 0 units  CBG 121 - 150: 0 units  CBG 151 - 200: 0 units  CBG 201 - 250: 2 units  CBG 251 - 300: 3 units  CBG 301 - 350: 4 units  CBG 351 - 400: 5 units 15 mL 0   insulin glargine (LANTUS) 100 UNIT/ML injection Inject 0.2 mLs (20 Units total) into the skin 2 (two) times daily. 10 mL 0   Lancets Misc. (UNISTIK 3 COMFORT) MISC USE LANCETS TO CHECK BLOOD GLUCOSE BID      lidocaine (XYLOCAINE) 2 % solution Use as directed 15 mLs in the mouth or throat as needed for mouth pain. 100 mL 2   methocarbamol (ROBAXIN) 500 MG tablet Take 1 tablet (500 mg total) by mouth every 6 (six) hours as needed for muscle spasms. 20 tablet 0   metoprolol tartrate (LOPRESSOR) 50 MG tablet      omeprazole (PRILOSEC) 20 MG capsule Take 20 mg by mouth daily with supper.      ondansetron (ZOFRAN) 4 MG tablet Take 2 tablets (8 mg total) by mouth every 8 (eight) hours as needed for refractory nausea / vomiting. For nausea/vomiting prevention with pain medication. 30 tablet 2   oxybutynin (DITROPAN-XL) 10 MG 24 hr tablet Take 10 mg by mouth daily.     PREVIDENT 5000 DRY MOUTH 1.1 % GEL dental gel Take 1 application by mouth daily.      propylthiouracil (PTU) 50 MG tablet Take 75 mg by mouth daily.   0   QUEtiapine (SEROQUEL)  50 MG tablet Take 50 mg by mouth at bedtime.     rOPINIRole (REQUIP) 2 MG tablet Take 3 mg by mouth at bedtime.      tiZANidine (ZANAFLEX) 4 MG tablet Take 4 mg by mouth 2 (two) times daily.     No current facility-administered medications for this visit.    OBJECTIVE: White woman using a walker  Vitals:   01/08/21 1314  BP: (!) 153/51  Pulse: 60  Resp: 18  Temp: 99.5 F (37.5 C)  SpO2: 97%      Body mass index is 44.76 kg/m.   Wt Readings from Last 3 Encounters:  01/08/21 236 lb 14.4 oz (107.5 kg)  09/02/20 228 lb (103.4 kg)  01/09/20 230 lb 9.6 oz (104.6 kg)  ECOG FS:2 - Symptomatic, <50% confined to bed  Sclerae unicteric, EOMs intact Wearing a mask No cervical or supraclavicular adenopathy Lungs no rales or rhonchi Heart regular rate and rhythm Abd soft, pes nontender, positive bowel sounds MSK no focal spinal tenderness, left knee brace in place Neuro: nonfocal, well oriented, appropriate affect Breasts: Deferred   LAB RESULTS:  CMP     Component Value Date/Time   NA 142 10/08/2020 1019   K 4.7 10/08/2020 1019   CL 103 10/08/2020 1019    CO2 27 10/08/2020 1019   GLUCOSE 229 (H) 10/08/2020 1019   BUN 26 (H) 10/08/2020 1019   CREATININE 0.90 10/08/2020 1019   CREATININE 0.85 10/11/2018 1609   CALCIUM 9.8 10/08/2020 1019   PROT 7.4 10/08/2020 1019   ALBUMIN 4.1 10/08/2020 1019   AST 21 10/08/2020 1019   ALT 18 10/08/2020 1019   ALKPHOS 78 10/08/2020 1019   BILITOT 0.5 10/08/2020 1019   GFRNONAA >60 10/08/2020 1019   GFRAA 56 (L) 10/02/2019 1122    No results found for: TOTALPROTELP, ALBUMINELP, A1GS, A2GS, BETS, BETA2SER, GAMS, MSPIKE, SPEI  No results found for: KPAFRELGTCHN, LAMBDASER, KAPLAMBRATIO  Lab Results  Component Value Date   WBC 2.6 (L) 01/08/2021   NEUTROABS 1.5 (L) 01/08/2021   HGB 11.3 (L) 01/08/2021   HCT 34.4 (L) 01/08/2021   MCV 84.9 01/08/2021   PLT 130 (L) 01/08/2021      Chemistry      Component Value Date/Time   NA 142 10/08/2020 1019   K 4.7 10/08/2020 1019   CL 103 10/08/2020 1019   CO2 27 10/08/2020 1019   BUN 26 (H) 10/08/2020 1019   CREATININE 0.90 10/08/2020 1019   CREATININE 0.85 10/11/2018 1609      Component Value Date/Time   CALCIUM 9.8 10/08/2020 1019   ALKPHOS 78 10/08/2020 1019   AST 21 10/08/2020 1019   ALT 18 10/08/2020 1019   BILITOT 0.5 10/08/2020 1019     No results found for: LABCA2  No components found for: XBLTJQ300  No results for input(s): INR in the last 168 hours.  No results found for: LABCA2  No results found for: PQZ300  No results found for: TMA263  No results found for: FHL456  No results found for: CA2729  No components found for: HGQUANT  No results found for: CEA1 / No results found for: CEA1   No results found for: AFPTUMOR  No results found for: CHROMOGRNA  No results found for: TOTALPROTELP, ALBUMINELP, A1GS, A2GS, BETS, BETA2SER, GAMS, MSPIKE, SPEI (this displays SPEP labs)  No results found for: KPAFRELGTCHN, LAMBDASER, KAPLAMBRATIO (kappa/lambda light chains)  No results found for: HGBA, HGBA2QUANT, HGBFQUANT,  HGBSQUAN (Hemoglobinopathy evaluation)   No results  found for: LDH  Lab Results  Component Value Date   IRON 46 10/08/2020   TIBC 349 10/08/2020   IRONPCTSAT 13 (L) 10/08/2020   (Iron and TIBC)  Lab Results  Component Value Date   FERRITIN 21 10/08/2020    Urinalysis    Component Value Date/Time   COLORURINE YELLOW 07/14/2018 0914   APPEARANCEUR CLEAR 07/14/2018 0914   LABSPEC 1.013 07/14/2018 0914   PHURINE 6.0 07/14/2018 0914   GLUCOSEU NEGATIVE 07/14/2018 0914   HGBUR NEGATIVE 07/14/2018 0914   BILIRUBINUR NEGATIVE 07/14/2018 0914   KETONESUR 5 (A) 07/14/2018 0914   PROTEINUR NEGATIVE 07/14/2018 0914   UROBILINOGEN 0.2 07/20/2014 1707   NITRITE NEGATIVE 07/14/2018 0914   LEUKOCYTESUR NEGATIVE 07/14/2018 0914    STUDIES: No results found.   ASSESSMENT: 70 y.o. [place] woman with multiple comorbidities referred to hematology for evaluation of pancytopenia.    1. Microcytic Anemia: Iron deficiency  (a) status post Feraheme 07/16/2019 and 07/23/2019  (b) status post Venofer 10/23/2020, 10/30/2020 and 11/06/2020  2. Neutropenia and thrombocytopenia  (a) Inflammatory markers in 06/2019 normal  (b) possibly or partly secondary to splenomegaly   3. Incidental Pancreatic Cyst noted 05/2019  (a) followed by Dr. Ardis Hughs  (b) stable on most recent MRI 06/2019  (c) due for repeat imaging in 06/2021   PLAN: Jaelynn's hemoglobin is over 11.  Repeat iron studies today are pending.  I suggested she start ferrous sulfate daily with food.  Most patients can tolerate this tablet once a day if taken with a meal and it might make it less likely for her to need further IV iron in the future.  She has significant issues with knee and other problems which limit her functional status.  She is behind on mammography and I have set her up for screening mammography sometime within the next month at the [facility]  Otherwise she will return to see [place] in 6 months.  If she is stable  at that time possibly we can discontinue routine follow-up and see her on an as-needed basis in the future  Total encounter time 20 minutes.Sarajane Jews C. Nur Krasinski, MD 01/08/21 1:31 PM Medical Oncology and Hematology [facility] [place], [place] [address] Tel. [phone]    Fax. [phone]   I, Wilburn Mylar, am acting as scribe for Dr. Virgie Dad. Dilynn Munroe.  I, Lurline Del MD, have reviewed the above documentation for accuracy and completeness, and I agree with the above.   *Total Encounter Time as defined by the Centers for Medicare and Medicaid Services includes, in addition to the face-to-face time of a patient visit (documented in the note above) non-face-to-face time: obtaining and reviewing outside history, ordering and reviewing medications, tests or procedures, care coordination (communications with other health care professionals or caregivers) and documentation in the medical record.

## 2021-01-08 ENCOUNTER — Inpatient Hospital Stay: Payer: Medicare Other | Attending: Oncology | Admitting: Oncology

## 2021-01-08 ENCOUNTER — Inpatient Hospital Stay: Payer: Medicare Other

## 2021-01-08 ENCOUNTER — Other Ambulatory Visit: Payer: Self-pay

## 2021-01-08 VITALS — BP 153/51 | HR 60 | Temp 99.5°F | Resp 18 | Ht 61.0 in | Wt 236.9 lb

## 2021-01-08 DIAGNOSIS — D61818 Other pancytopenia: Secondary | ICD-10-CM | POA: Insufficient documentation

## 2021-01-08 DIAGNOSIS — Z8051 Family history of malignant neoplasm of kidney: Secondary | ICD-10-CM | POA: Diagnosis not present

## 2021-01-08 DIAGNOSIS — R161 Splenomegaly, not elsewhere classified: Secondary | ICD-10-CM | POA: Diagnosis not present

## 2021-01-08 DIAGNOSIS — D508 Other iron deficiency anemias: Secondary | ICD-10-CM

## 2021-01-08 DIAGNOSIS — Z9884 Bariatric surgery status: Secondary | ICD-10-CM | POA: Insufficient documentation

## 2021-01-08 DIAGNOSIS — Z1231 Encounter for screening mammogram for malignant neoplasm of breast: Secondary | ICD-10-CM | POA: Diagnosis not present

## 2021-01-08 DIAGNOSIS — K862 Cyst of pancreas: Secondary | ICD-10-CM | POA: Diagnosis not present

## 2021-01-08 DIAGNOSIS — D509 Iron deficiency anemia, unspecified: Secondary | ICD-10-CM | POA: Insufficient documentation

## 2021-01-08 DIAGNOSIS — Z90710 Acquired absence of both cervix and uterus: Secondary | ICD-10-CM | POA: Insufficient documentation

## 2021-01-08 LAB — CBC WITH DIFFERENTIAL/PLATELET
Abs Immature Granulocytes: 0.03 10*3/uL (ref 0.00–0.07)
Basophils Absolute: 0 10*3/uL (ref 0.0–0.1)
Basophils Relative: 0 %
Eosinophils Absolute: 0 10*3/uL (ref 0.0–0.5)
Eosinophils Relative: 0 %
HCT: 34.4 % — ABNORMAL LOW (ref 36.0–46.0)
Hemoglobin: 11.3 g/dL — ABNORMAL LOW (ref 12.0–15.0)
Immature Granulocytes: 1 %
Lymphocytes Relative: 35 %
Lymphs Abs: 0.9 10*3/uL (ref 0.7–4.0)
MCH: 27.9 pg (ref 26.0–34.0)
MCHC: 32.8 g/dL (ref 30.0–36.0)
MCV: 84.9 fL (ref 80.0–100.0)
Monocytes Absolute: 0.2 10*3/uL (ref 0.1–1.0)
Monocytes Relative: 9 %
Neutro Abs: 1.5 10*3/uL — ABNORMAL LOW (ref 1.7–7.7)
Neutrophils Relative %: 55 %
Platelets: 130 10*3/uL — ABNORMAL LOW (ref 150–400)
RBC: 4.05 MIL/uL (ref 3.87–5.11)
RDW: 14.6 % (ref 11.5–15.5)
WBC: 2.6 10*3/uL — ABNORMAL LOW (ref 4.0–10.5)
nRBC: 0 % (ref 0.0–0.2)

## 2021-01-08 LAB — CMP (CANCER CENTER ONLY)
ALT: 22 U/L (ref 0–44)
AST: 29 U/L (ref 15–41)
Albumin: 4.3 g/dL (ref 3.5–5.0)
Alkaline Phosphatase: 84 U/L (ref 38–126)
Anion gap: 10 (ref 5–15)
BUN: 30 mg/dL — ABNORMAL HIGH (ref 8–23)
CO2: 23 mmol/L (ref 22–32)
Calcium: 9.5 mg/dL (ref 8.9–10.3)
Chloride: 103 mmol/L (ref 98–111)
Creatinine: 1.04 mg/dL — ABNORMAL HIGH (ref 0.44–1.00)
GFR, Estimated: 59 mL/min — ABNORMAL LOW (ref >60)
Glucose, Bld: 150 mg/dL — ABNORMAL HIGH (ref 70–99)
Potassium: 4.7 mmol/L (ref 3.5–5.1)
Sodium: 136 mmol/L (ref 135–145)
Total Bilirubin: 0.6 mg/dL (ref 0.3–1.2)
Total Protein: 7.5 g/dL (ref 6.5–8.1)

## 2021-01-08 LAB — SAVE SMEAR(SSMR), FOR PROVIDER SLIDE REVIEW

## 2021-01-08 LAB — RETICULOCYTES
Immature Retic Fract: 13.7 % (ref 2.3–15.9)
RBC.: 4.09 MIL/uL (ref 3.87–5.11)
Retic Count, Absolute: 89.2 10*3/uL (ref 19.0–186.0)
Retic Ct Pct: 2.2 % (ref 0.4–3.1)

## 2021-01-09 LAB — IRON AND TIBC
Iron: 63 ug/dL (ref 28–170)
Saturation Ratios: 18 % (ref 10.4–31.8)
TIBC: 345 ug/dL (ref 250–450)
UIBC: 282 ug/dL

## 2021-01-09 LAB — FERRITIN: Ferritin: 86 ng/mL (ref 11–307)

## 2021-01-27 ENCOUNTER — Ambulatory Visit
Admission: RE | Admit: 2021-01-27 | Discharge: 2021-01-27 | Disposition: A | Discharge status: Home / Self Care | Payer: Medicare Other | Source: Ambulatory Visit | Attending: Oncology | Admitting: Oncology

## 2021-01-27 ENCOUNTER — Other Ambulatory Visit: Payer: Self-pay

## 2021-01-27 ENCOUNTER — Encounter: Payer: Self-pay | Admitting: Adult Health

## 2021-01-27 DIAGNOSIS — D508 Other iron deficiency anemias: Secondary | ICD-10-CM

## 2021-01-27 DIAGNOSIS — R161 Splenomegaly, not elsewhere classified: Secondary | ICD-10-CM

## 2021-01-27 DIAGNOSIS — Z1231 Encounter for screening mammogram for malignant neoplasm of breast: Secondary | ICD-10-CM

## 2021-02-23 DIAGNOSIS — M1712 Unilateral primary osteoarthritis, left knee: Secondary | ICD-10-CM | POA: Diagnosis not present

## 2021-02-23 DIAGNOSIS — Z6841 Body Mass Index (BMI) 40.0 and over, adult: Secondary | ICD-10-CM | POA: Diagnosis not present

## 2021-02-23 DIAGNOSIS — M65342 Trigger finger, left ring finger: Secondary | ICD-10-CM | POA: Diagnosis not present

## 2021-03-03 DIAGNOSIS — H35033 Hypertensive retinopathy, bilateral: Secondary | ICD-10-CM | POA: Diagnosis not present

## 2021-03-03 DIAGNOSIS — H35361 Drusen (degenerative) of macula, right eye: Secondary | ICD-10-CM | POA: Diagnosis not present

## 2021-03-03 DIAGNOSIS — H43813 Vitreous degeneration, bilateral: Secondary | ICD-10-CM | POA: Diagnosis not present

## 2021-03-03 DIAGNOSIS — H348122 Central retinal vein occlusion, left eye, stable: Secondary | ICD-10-CM | POA: Diagnosis not present

## 2021-03-03 DIAGNOSIS — H35372 Puckering of macula, left eye: Secondary | ICD-10-CM | POA: Diagnosis not present

## 2021-03-03 DIAGNOSIS — E113392 Type 2 diabetes mellitus with moderate nonproliferative diabetic retinopathy without macular edema, left eye: Secondary | ICD-10-CM | POA: Diagnosis not present

## 2021-03-04 ENCOUNTER — Ambulatory Visit (INDEPENDENT_AMBULATORY_CARE_PROVIDER_SITE_OTHER): Payer: Medicare Other | Admitting: Internal Medicine

## 2021-03-04 ENCOUNTER — Other Ambulatory Visit (HOSPITAL_COMMUNITY): Payer: Self-pay

## 2021-03-04 ENCOUNTER — Encounter: Payer: Self-pay | Admitting: Adult Health

## 2021-03-04 ENCOUNTER — Encounter: Payer: Self-pay | Admitting: Internal Medicine

## 2021-03-04 ENCOUNTER — Other Ambulatory Visit: Payer: Self-pay

## 2021-03-04 DIAGNOSIS — G062 Extradural and subdural abscess, unspecified: Secondary | ICD-10-CM

## 2021-03-04 DIAGNOSIS — I6529 Occlusion and stenosis of unspecified carotid artery: Secondary | ICD-10-CM | POA: Diagnosis not present

## 2021-03-04 MED ORDER — DOXYCYCLINE MONOHYDRATE 100 MG PO CAPS: 100.0000 mg | ORAL_CAPSULE | Freq: Two times a day (BID) | ORAL | 11 refills | Status: DC
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
## 2021-03-04 NOTE — Assessment & Plan Note (Signed)
Her inflammatory markers had normalized as of her last visit in June.  I told her that there is a good chance that her infection has been cured through a combination of surgery and prolonged antibiotic therapy.  However, she prefers to stay on suppressive doxycycline.  I will switch her to capsules which she can get for $4 each month.  She will get repeat blood work today and follow-up in 6 months.

## 2021-03-05 ENCOUNTER — Emergency Department (HOSPITAL_COMMUNITY)
Admission: EM | Admit: 2021-03-05 | Discharge: 2021-03-05 | Disposition: A | Discharge status: Home / Self Care | Payer: Medicare Other | Attending: Emergency Medicine | Admitting: Emergency Medicine

## 2021-03-05 ENCOUNTER — Other Ambulatory Visit: Payer: Self-pay

## 2021-03-05 ENCOUNTER — Encounter (HOSPITAL_COMMUNITY): Payer: Self-pay | Admitting: Emergency Medicine

## 2021-03-05 DIAGNOSIS — E039 Hypothyroidism, unspecified: Secondary | ICD-10-CM | POA: Diagnosis not present

## 2021-03-05 DIAGNOSIS — Z96611 Presence of right artificial shoulder joint: Secondary | ICD-10-CM | POA: Diagnosis not present

## 2021-03-05 DIAGNOSIS — Z794 Long term (current) use of insulin: Secondary | ICD-10-CM | POA: Diagnosis not present

## 2021-03-05 DIAGNOSIS — X58XXXA Exposure to other specified factors, initial encounter: Secondary | ICD-10-CM | POA: Insufficient documentation

## 2021-03-05 DIAGNOSIS — R531 Weakness: Secondary | ICD-10-CM | POA: Diagnosis not present

## 2021-03-05 DIAGNOSIS — J45909 Unspecified asthma, uncomplicated: Secondary | ICD-10-CM | POA: Diagnosis not present

## 2021-03-05 DIAGNOSIS — T40711A Poisoning by cannabis, accidental (unintentional), initial encounter: Secondary | ICD-10-CM | POA: Diagnosis not present

## 2021-03-05 DIAGNOSIS — I10 Essential (primary) hypertension: Secondary | ICD-10-CM | POA: Diagnosis not present

## 2021-03-05 DIAGNOSIS — E119 Type 2 diabetes mellitus without complications: Secondary | ICD-10-CM | POA: Insufficient documentation

## 2021-03-05 DIAGNOSIS — Z79899 Other long term (current) drug therapy: Secondary | ICD-10-CM | POA: Diagnosis not present

## 2021-03-05 DIAGNOSIS — Z96651 Presence of right artificial knee joint: Secondary | ICD-10-CM | POA: Insufficient documentation

## 2021-03-05 DIAGNOSIS — T6594XA Toxic effect of unspecified substance, undetermined, initial encounter: Secondary | ICD-10-CM | POA: Diagnosis not present

## 2021-03-05 DIAGNOSIS — T50901A Poisoning by unspecified drugs, medicaments and biological substances, accidental (unintentional), initial encounter: Secondary | ICD-10-CM | POA: Diagnosis present

## 2021-03-05 LAB — RAPID URINE DRUG SCREEN, HOSP PERFORMED
Amphetamines: NOT DETECTED
Barbiturates: NOT DETECTED
Benzodiazepines: NOT DETECTED
Cocaine: NOT DETECTED
Opiates: NOT DETECTED
Tetrahydrocannabinol: POSITIVE — AB

## 2021-03-05 LAB — CBC WITH DIFFERENTIAL/PLATELET
Abs Immature Granulocytes: 0.01 10*3/uL (ref 0.00–0.07)
Basophils Absolute: 0 10*3/uL (ref 0.0–0.1)
Basophils Relative: 0 %
Eosinophils Absolute: 0 10*3/uL (ref 0.0–0.5)
Eosinophils Relative: 0 %
HCT: 36.1 % (ref 36.0–46.0)
Hemoglobin: 11.9 g/dL — ABNORMAL LOW (ref 12.0–15.0)
Immature Granulocytes: 0 %
Lymphocytes Relative: 21 %
Lymphs Abs: 0.6 10*3/uL — ABNORMAL LOW (ref 0.7–4.0)
MCH: 29 pg (ref 26.0–34.0)
MCHC: 33 g/dL (ref 30.0–36.0)
MCV: 88 fL (ref 80.0–100.0)
Monocytes Absolute: 0.2 10*3/uL (ref 0.1–1.0)
Monocytes Relative: 8 %
Neutro Abs: 1.9 10*3/uL (ref 1.7–7.7)
Neutrophils Relative %: 71 %
Platelets: 119 10*3/uL — ABNORMAL LOW (ref 150–400)
RBC: 4.1 MIL/uL (ref 3.87–5.11)
RDW: 13.7 % (ref 11.5–15.5)
WBC: 2.6 10*3/uL — ABNORMAL LOW (ref 4.0–10.5)
nRBC: 0 % (ref 0.0–0.2)

## 2021-03-05 LAB — BASIC METABOLIC PANEL
Anion gap: 10 (ref 5–15)
BUN: 32 mg/dL — ABNORMAL HIGH (ref 8–23)
CO2: 24 mmol/L (ref 22–32)
Calcium: 9.1 mg/dL (ref 8.9–10.3)
Chloride: 102 mmol/L (ref 98–111)
Creatinine, Ser: 1.16 mg/dL — ABNORMAL HIGH (ref 0.44–1.00)
GFR, Estimated: 51 mL/min — ABNORMAL LOW (ref >60)
Glucose, Bld: 257 mg/dL — ABNORMAL HIGH (ref 70–99)
Potassium: 4.5 mmol/L (ref 3.5–5.1)
Sodium: 136 mmol/L (ref 135–145)

## 2021-03-05 LAB — URINALYSIS, ROUTINE W REFLEX MICROSCOPIC
Bacteria, UA: NONE SEEN
Bilirubin Urine: NEGATIVE
Glucose, UA: 500 mg/dL — AB
Ketones, ur: NEGATIVE mg/dL
Leukocytes,Ua: NEGATIVE
Nitrite: NEGATIVE
Protein, ur: NEGATIVE mg/dL
Specific Gravity, Urine: 1.02 (ref 1.005–1.030)
pH: 6 (ref 5.0–8.0)

## 2021-03-05 LAB — SEDIMENTATION RATE: Sed Rate: 25 mm/h (ref 0–30)

## 2021-03-05 LAB — C-REACTIVE PROTEIN: CRP: 4.5 mg/L (ref <8.0)

## 2021-03-05 LAB — MAGNESIUM: Magnesium: 2.3 mg/dL (ref 1.7–2.4)

## 2021-03-05 MED ORDER — SODIUM CHLORIDE 0.9 % IV BOLUS
1000.0000 mL | Freq: Once | INTRAVENOUS | Status: AC
  Administered 2021-03-05: 1000 mL via INTRAVENOUS

## 2021-03-05 NOTE — ED Triage Notes (Signed)
BIB EMS from home, pt takes CBD gummies regularly, thought she took a CBD gummy sample from a vape shop, she does not know what it was. Pt reports after ingestion she feels numb and her limbs are heavy.
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
## 2021-03-05 NOTE — ED Provider Notes (Signed)
Emergency Medicine Provider Triage Evaluation Note  [name] , a 70 y.o. female  was evaluated in triage.  Pt complains of numbness all over and "my body feels like rocks".  Patient usually takes CBD Gummies, took a THC gummy or something similar from her sons vape shop.  She took the THC/CBD around 1/2:00.  Reports she only took 1.  History of  Bell's palsy  Review of Systems  Positive: above Negative: above  Physical Exam  BP 94/63 (BP Location: Left Arm)    Pulse 63    Temp 97.6 F (36.4 C) (Oral)    Resp 16    Ht 5\' 1"  (1.549 m)    Wt 107 kg    SpO2 99%    BMI 44.57 kg/m  Gen:   Awake, no distress   Resp:  Normal effort  MSK:   Moves extremities without difficulty  Other:  Left facial droop secondary to Bell's palsy, chronic.  Cranial nerves II through XII grossly intact, grip strength equal bilaterally.  Slight dysarthria, follows commands and answers questions appropriately.  Medical Decision Making  Medically screening exam initiated at 5:55 PM.  Appropriate orders placed.  Kathrene Sinopoli Matsushima was informed that the remainder of the evaluation will be completed by another provider, this initial triage assessment does not replace that evaluation, and the importance of remaining in the ED until their evaluation is complete.     Sherrill Raring, PA-C 03/05/21 1757    Isla Pence, MD 03/05/21 [phone]

## 2021-03-19 ENCOUNTER — Other Ambulatory Visit: Payer: Self-pay | Admitting: *Deleted

## 2021-03-19 DIAGNOSIS — M79605 Pain in left leg: Secondary | ICD-10-CM

## 2021-04-01 ENCOUNTER — Ambulatory Visit (INDEPENDENT_AMBULATORY_CARE_PROVIDER_SITE_OTHER): Payer: Medicare Other | Admitting: Physician Assistant

## 2021-04-01 ENCOUNTER — Other Ambulatory Visit: Payer: Self-pay

## 2021-04-01 ENCOUNTER — Ambulatory Visit (HOSPITAL_COMMUNITY)
Admission: RE | Admit: 2021-04-01 | Discharge: 2021-04-01 | Disposition: A | Discharge status: Home / Self Care | Payer: Medicare Other | Source: Ambulatory Visit | Attending: Vascular Surgery | Admitting: Vascular Surgery

## 2021-04-01 VITALS — BP 150/58 | HR 53 | Temp 98.2°F | Resp 20 | Ht 61.0 in | Wt 238.7 lb

## 2021-04-01 DIAGNOSIS — I872 Venous insufficiency (chronic) (peripheral): Secondary | ICD-10-CM

## 2021-04-01 DIAGNOSIS — M79605 Pain in left leg: Secondary | ICD-10-CM | POA: Diagnosis not present
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
## 2021-04-06 DIAGNOSIS — M1712 Unilateral primary osteoarthritis, left knee: Secondary | ICD-10-CM | POA: Diagnosis not present

## 2021-04-06 DIAGNOSIS — Z6841 Body Mass Index (BMI) 40.0 and over, adult: Secondary | ICD-10-CM | POA: Diagnosis not present

## 2021-04-21 ENCOUNTER — Ambulatory Visit (INDEPENDENT_AMBULATORY_CARE_PROVIDER_SITE_OTHER): Payer: Medicare Other | Admitting: Adult Health

## 2021-04-21 DIAGNOSIS — Z9989 Dependence on other enabling machines and devices: Secondary | ICD-10-CM

## 2021-04-21 DIAGNOSIS — G4733 Obstructive sleep apnea (adult) (pediatric): Secondary | ICD-10-CM

## 2021-04-21 NOTE — Progress Notes (Signed)
°  [facility] [address] [place]. [place] [address] [phone]  PRIMARY NEUROLOGIST: Dr. Brett Fairy   Virtual Visit via Telephone Note  I connected with [name] on 04/21/21 at  1:15 PM EST by telephone located remotely at [facility] and verified that I am speaking with the correct person using two identifiers who reports being located at home.   Visit scheduled by [facility] office staff. She discussed the limitations, risks, security and privacy concerns of performing an evaluation and management service by telephone and the availability of in person appointments. I also discussed with the patient that there may be a patient responsible charge related to this service. The patient expressed understanding and agreed to proceed. See telephone note for consent and additional scheduling information.    History of Present Illness:  [name] is a 71 y.o. female with a history of obstructive sleep apnea on CPAP.  She joins me today for a telephone visit.  Unable to do a video visit due to issues logging into her MyChart.  CPAP report is below.  She denies any new issues   Observations/Objective:  Generalized: Well developed, in no acute distress   Neurological examination  Mentation: Alert oriented to time, place, history taking. Follows all commands speech and language fluent  Assessment and Plan:  1: Obstructive sleep apnea on CPAP  -Good compliance -For treatment of apnea -Encourage patient use CPAP nightly greater than 4 hours each night   Follow Up Instructions:   F/U in 1 year or sooner if needed    I discussed the assessment and treatment plan with the patient.  The patient was provided an opportunity to ask questions and all were answered to their satisfaction. The patient agreed with the plan and verbalized an understanding of the instructions.   I provided 8 minutes of non-face-to-face time during this  encounter.    Ward Givens NP-C  [facility] [address] [place], [place] [address]  Phone [phone] Fax [phone] \

## 2021-04-23 DIAGNOSIS — E1129 Type 2 diabetes mellitus with other diabetic kidney complication: Secondary | ICD-10-CM | POA: Diagnosis not present

## 2021-04-23 DIAGNOSIS — N1831 Chronic kidney disease, stage 3a: Secondary | ICD-10-CM | POA: Diagnosis not present

## 2021-04-23 DIAGNOSIS — J45909 Unspecified asthma, uncomplicated: Secondary | ICD-10-CM | POA: Diagnosis not present

## 2021-04-23 DIAGNOSIS — R0602 Shortness of breath: Secondary | ICD-10-CM | POA: Diagnosis not present

## 2021-04-23 DIAGNOSIS — D649 Anemia, unspecified: Secondary | ICD-10-CM | POA: Diagnosis not present

## 2021-04-23 DIAGNOSIS — D509 Iron deficiency anemia, unspecified: Secondary | ICD-10-CM | POA: Diagnosis not present

## 2021-04-23 DIAGNOSIS — I10 Essential (primary) hypertension: Secondary | ICD-10-CM | POA: Diagnosis not present

## 2021-05-18 ENCOUNTER — Telehealth: Payer: Self-pay | Admitting: *Deleted

## 2021-05-18 NOTE — Telephone Encounter (Signed)
Returning [name]'s earlier telephone message requesting further follow up with her [facility] office visit with PA on 04-01-2021.  Ms. Hannis states she is still having left calf pain that is "worse when resting".  States that she is wearing knee high compression hose and elevating left leg when possible with minimal improvement.  She states she has no swelling/edema and no LE ulcerations. Will discuss patient's symptoms with Laurence Slate PA and call Ms. Ganser back.  Ms. Heinlein verbalized understanding.

## 2021-05-19 ENCOUNTER — Telehealth: Payer: Self-pay | Admitting: *Deleted

## 2021-05-19 NOTE — Telephone Encounter (Signed)
Discussed telephone encounter from yesterday ,reviewed [name]'s office notes and venous reflux study and discussed [name] symptoms of having left leg pain when at rest with Tiffany Slate PA   [name] recommends continued wearing of knee high compression hose, elevation of leg, and continued walking for exercise or if patient wants to pursue possible venous procedure fitting her for thigh high hose and making an appointment to see [facility] MD who does venous procedures to evaluate her.  Discussed both possibilities with [name].  [name] states she wants to continue wearing knee high compression hose and monitoring her symptoms and will call back if she has questions,concerns, or if her symptoms worsen.

## 2021-05-26 DIAGNOSIS — E1169 Type 2 diabetes mellitus with other specified complication: Secondary | ICD-10-CM | POA: Diagnosis not present

## 2021-06-10 DIAGNOSIS — G4733 Obstructive sleep apnea (adult) (pediatric): Secondary | ICD-10-CM | POA: Diagnosis not present

## 2021-06-10 DIAGNOSIS — R051 Acute cough: Secondary | ICD-10-CM | POA: Diagnosis not present

## 2021-06-10 DIAGNOSIS — J45909 Unspecified asthma, uncomplicated: Secondary | ICD-10-CM | POA: Diagnosis not present

## 2021-06-10 DIAGNOSIS — J029 Acute pharyngitis, unspecified: Secondary | ICD-10-CM | POA: Diagnosis not present

## 2021-06-10 DIAGNOSIS — J309 Allergic rhinitis, unspecified: Secondary | ICD-10-CM | POA: Diagnosis not present

## 2021-06-10 DIAGNOSIS — Z1152 Encounter for screening for COVID-19: Secondary | ICD-10-CM | POA: Diagnosis not present

## 2021-06-10 DIAGNOSIS — R0981 Nasal congestion: Secondary | ICD-10-CM | POA: Diagnosis not present

## 2021-06-10 DIAGNOSIS — R5383 Other fatigue: Secondary | ICD-10-CM | POA: Diagnosis not present

## 2021-06-16 DIAGNOSIS — Z20822 Contact with and (suspected) exposure to covid-19: Secondary | ICD-10-CM | POA: Diagnosis not present

## 2021-06-19 DIAGNOSIS — J45909 Unspecified asthma, uncomplicated: Secondary | ICD-10-CM | POA: Diagnosis not present

## 2021-06-19 DIAGNOSIS — N1831 Chronic kidney disease, stage 3a: Secondary | ICD-10-CM | POA: Diagnosis not present

## 2021-06-19 DIAGNOSIS — J069 Acute upper respiratory infection, unspecified: Secondary | ICD-10-CM | POA: Diagnosis not present

## 2021-06-19 DIAGNOSIS — R051 Acute cough: Secondary | ICD-10-CM | POA: Diagnosis not present

## 2021-06-19 DIAGNOSIS — I129 Hypertensive chronic kidney disease with stage 1 through stage 4 chronic kidney disease, or unspecified chronic kidney disease: Secondary | ICD-10-CM | POA: Diagnosis not present

## 2021-06-21 DIAGNOSIS — Z20822 Contact with and (suspected) exposure to covid-19: Secondary | ICD-10-CM | POA: Diagnosis not present

## 2021-07-09 DIAGNOSIS — Z20822 Contact with and (suspected) exposure to covid-19: Secondary | ICD-10-CM | POA: Diagnosis not present

## 2021-07-13 ENCOUNTER — Telehealth: Payer: Self-pay

## 2021-07-13 DIAGNOSIS — K862 Cyst of pancreas: Secondary | ICD-10-CM

## 2021-07-13 NOTE — Telephone Encounter (Signed)
-----   Message from Timothy Lasso, RN sent at 07/11/2019 11:17 AM EDT ----- ?MRI of pancreas in 2 years.  Thanks  ? ?

## 2021-07-13 NOTE — Telephone Encounter (Signed)
The order has been entered and sent to the schedulers. The pt has been advised and agrees.   ?

## 2021-07-14 ENCOUNTER — Other Ambulatory Visit: Payer: Self-pay | Admitting: Hematology and Oncology

## 2021-07-14 ENCOUNTER — Inpatient Hospital Stay (HOSPITAL_BASED_OUTPATIENT_CLINIC_OR_DEPARTMENT_OTHER): Payer: Medicare Other | Admitting: Hematology and Oncology

## 2021-07-14 ENCOUNTER — Inpatient Hospital Stay: Payer: Medicare Other | Attending: Hematology and Oncology

## 2021-07-14 VITALS — BP 152/58 | HR 59 | Temp 96.8°F | Resp 17 | Wt 234.6 lb

## 2021-07-14 DIAGNOSIS — D509 Iron deficiency anemia, unspecified: Secondary | ICD-10-CM | POA: Insufficient documentation

## 2021-07-14 DIAGNOSIS — R161 Splenomegaly, not elsewhere classified: Secondary | ICD-10-CM | POA: Diagnosis not present

## 2021-07-14 DIAGNOSIS — D696 Thrombocytopenia, unspecified: Secondary | ICD-10-CM | POA: Diagnosis not present

## 2021-07-14 DIAGNOSIS — D508 Other iron deficiency anemias: Secondary | ICD-10-CM | POA: Diagnosis not present

## 2021-07-14 DIAGNOSIS — Z794 Long term (current) use of insulin: Secondary | ICD-10-CM | POA: Diagnosis not present

## 2021-07-14 DIAGNOSIS — D61818 Other pancytopenia: Secondary | ICD-10-CM

## 2021-07-14 DIAGNOSIS — Z79899 Other long term (current) drug therapy: Secondary | ICD-10-CM | POA: Diagnosis not present

## 2021-07-14 LAB — CMP (CANCER CENTER ONLY)
ALT: 22 U/L (ref 0–44)
AST: 23 U/L (ref 15–41)
Albumin: 4 g/dL (ref 3.5–5.0)
Alkaline Phosphatase: 104 U/L (ref 38–126)
Anion gap: 10 (ref 5–15)
BUN: 25 mg/dL — ABNORMAL HIGH (ref 8–23)
CO2: 28 mmol/L (ref 22–32)
Calcium: 9.5 mg/dL (ref 8.9–10.3)
Chloride: 102 mmol/L (ref 98–111)
Creatinine: 1.19 mg/dL — ABNORMAL HIGH (ref 0.44–1.00)
GFR, Estimated: 49 mL/min — ABNORMAL LOW (ref >60)
Glucose, Bld: 283 mg/dL — ABNORMAL HIGH (ref 70–99)
Potassium: 4.6 mmol/L (ref 3.5–5.1)
Sodium: 140 mmol/L (ref 135–145)
Total Bilirubin: 0.5 mg/dL (ref 0.3–1.2)
Total Protein: 7 g/dL (ref 6.5–8.1)

## 2021-07-14 LAB — CBC WITH DIFFERENTIAL (CANCER CENTER ONLY)
Abs Immature Granulocytes: 0.01 10*3/uL (ref 0.00–0.07)
Basophils Absolute: 0 10*3/uL (ref 0.0–0.1)
Basophils Relative: 0 %
Eosinophils Absolute: 0 10*3/uL (ref 0.0–0.5)
Eosinophils Relative: 0 %
HCT: 33.9 % — ABNORMAL LOW (ref 36.0–46.0)
Hemoglobin: 11.2 g/dL — ABNORMAL LOW (ref 12.0–15.0)
Immature Granulocytes: 1 %
Lymphocytes Relative: 35 %
Lymphs Abs: 0.6 10*3/uL — ABNORMAL LOW (ref 0.7–4.0)
MCH: 28.1 pg (ref 26.0–34.0)
MCHC: 33 g/dL (ref 30.0–36.0)
MCV: 85 fL (ref 80.0–100.0)
Monocytes Absolute: 0.2 10*3/uL (ref 0.1–1.0)
Monocytes Relative: 9 %
Neutro Abs: 1 10*3/uL — ABNORMAL LOW (ref 1.7–7.7)
Neutrophils Relative %: 55 %
Platelet Count: 118 10*3/uL — ABNORMAL LOW (ref 150–400)
RBC: 3.99 MIL/uL (ref 3.87–5.11)
RDW: 14.1 % (ref 11.5–15.5)
WBC Count: 1.8 10*3/uL — ABNORMAL LOW (ref 4.0–10.5)
nRBC: 0 % (ref 0.0–0.2)

## 2021-07-14 LAB — FERRITIN: Ferritin: 55 ng/mL (ref 11–307)

## 2021-07-14 LAB — IRON AND IRON BINDING CAPACITY (CC-WL,HP ONLY)
Iron: 71 ug/dL (ref 28–170)
Saturation Ratios: 21 % (ref 10.4–31.8)
TIBC: 343 ug/dL (ref 250–450)
UIBC: 272 ug/dL (ref 148–442)

## 2021-07-14 LAB — RETIC PANEL
Immature Retic Fract: 15.5 % (ref 2.3–15.9)
RBC.: 3.96 MIL/uL (ref 3.87–5.11)
Retic Count, Absolute: 94.6 10*3/uL (ref 19.0–186.0)
Retic Ct Pct: 2.4 % (ref 0.4–3.1)
Reticulocyte Hemoglobin: 33.8 pg (ref >27.9)

## 2021-07-14 NOTE — Progress Notes (Signed)
Rescheduled to 1 day earlier. ?
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
## 2021-07-15 ENCOUNTER — Inpatient Hospital Stay: Payer: Medicare Other | Admitting: Hematology and Oncology

## 2021-07-15 ENCOUNTER — Telehealth: Payer: Self-pay | Admitting: *Deleted

## 2021-07-15 ENCOUNTER — Inpatient Hospital Stay: Payer: Medicare Other

## 2021-07-15 NOTE — Telephone Encounter (Signed)
-----   Message from Orson Slick, MD sent at 07/15/2021  8:55 AM EDT ----- ?Please let Mrs. Bayon know that her iron labs are steady and she does not need an iron infusion at this time. We can see her back in 6 months time (Oct 2023).  ?----- Message ----- ?From: Interface, Lab In [facility] ?Sent: 07/14/2021  10:37 AM EDT ?To: Orson Slick, MD ? ? ?

## 2021-07-15 NOTE — Telephone Encounter (Signed)
TCT patient regarding recent lab results. Spoke with patient and advised that her  Advised that we will see her in 6 months. Pt voiced understanding. iron labs are stable and she does not need IV iron at this time. Pt voiced understanding. ?

## 2021-07-19 ENCOUNTER — Encounter: Payer: Self-pay | Admitting: Adult Health

## 2021-07-27 ENCOUNTER — Ambulatory Visit (HOSPITAL_COMMUNITY)
Admission: RE | Admit: 2021-07-27 | Discharge: 2021-07-27 | Disposition: A | Discharge status: Home / Self Care | Payer: Medicare Other | Source: Ambulatory Visit | Attending: Gastroenterology | Admitting: Gastroenterology

## 2021-07-27 ENCOUNTER — Other Ambulatory Visit: Payer: Self-pay | Admitting: Gastroenterology

## 2021-07-27 DIAGNOSIS — K862 Cyst of pancreas: Secondary | ICD-10-CM

## 2021-07-27 DIAGNOSIS — R935 Abnormal findings on diagnostic imaging of other abdominal regions, including retroperitoneum: Secondary | ICD-10-CM | POA: Diagnosis not present

## 2021-07-27 DIAGNOSIS — R161 Splenomegaly, not elsewhere classified: Secondary | ICD-10-CM | POA: Diagnosis not present

## 2021-07-27 MED ORDER — GADOBUTROL 1 MMOL/ML IV SOLN
10.0000 mL | Freq: Once | INTRAVENOUS | Status: AC | PRN
  Administered 2021-07-27: 10 mL via INTRAVENOUS

## 2021-07-28 DIAGNOSIS — Z20822 Contact with and (suspected) exposure to covid-19: Secondary | ICD-10-CM | POA: Diagnosis not present

## 2021-07-28 DIAGNOSIS — R3915 Urgency of urination: Secondary | ICD-10-CM | POA: Diagnosis not present

## 2021-07-28 DIAGNOSIS — R350 Frequency of micturition: Secondary | ICD-10-CM | POA: Diagnosis not present

## 2021-07-28 DIAGNOSIS — N200 Calculus of kidney: Secondary | ICD-10-CM | POA: Diagnosis not present

## 2021-08-11 DIAGNOSIS — E119 Type 2 diabetes mellitus without complications: Secondary | ICD-10-CM | POA: Diagnosis not present

## 2021-08-24 ENCOUNTER — Telehealth: Payer: Self-pay | Admitting: Gastroenterology

## 2021-08-26 DIAGNOSIS — M1712 Unilateral primary osteoarthritis, left knee: Secondary | ICD-10-CM | POA: Diagnosis not present

## 2021-08-26 DIAGNOSIS — M19012 Primary osteoarthritis, left shoulder: Secondary | ICD-10-CM | POA: Diagnosis not present

## 2021-08-26 DIAGNOSIS — M65342 Trigger finger, left ring finger: Secondary | ICD-10-CM | POA: Diagnosis not present

## 2021-09-02 ENCOUNTER — Other Ambulatory Visit: Payer: Self-pay

## 2021-09-02 ENCOUNTER — Ambulatory Visit (INDEPENDENT_AMBULATORY_CARE_PROVIDER_SITE_OTHER): Payer: Medicare Other | Admitting: Internal Medicine

## 2021-09-02 DIAGNOSIS — M4646 Discitis, unspecified, lumbar region: Secondary | ICD-10-CM | POA: Diagnosis not present

## 2021-09-02 NOTE — Progress Notes (Signed)
Virtual Visit via Telephone Note  I connected with [name] on 09/02/21 at 11:00 AM EDT by telephone and verified that I am speaking with the correct person using two identifiers.  Location: Patient: Home  Provider: [facility]   I discussed the limitations, risks, security and privacy concerns of performing an evaluation and management service by telephone and the availability of in person appointments. I also discussed with the patient that there may be a patient responsible charge related to this service. The patient expressed understanding and agreed to proceed.   History of Present Illness: I called and spoke with [name] today.  She has been on chronic, suppressive doxycycline after developing methicillin-resistant coag negative staph bacteremia and lumbar infection in May 2020.  Her last 5 C-reactive proteins have been normal and her last 2 sed rates have been normal.  She recently developed bright red rectal bleeding that then turned into watery diarrhea.  She has not had any nausea, vomiting or abdominal cramps.  She has not had any fever.  She is scheduled to see her gastroenterologist in about 2 weeks.   Observations/Objective: Sed Rate  Date Value  03/04/2021 25 mm/h  09/02/2020 28 mm/h  07/10/2019 38 mm/hr (H)   CRP  Date Value  03/04/2021 4.5 mg/L  09/02/2020 7.2 mg/L  07/10/2019 0.6 mg/dL     Assessment and Plan: I told her again that I think there is a very good chance that her coagulase-negative staph infection has been cured.  She is now interested in stopping doxycycline.  Follow Up Instructions: Discontinue doxycycline now Her diarrhea persists she will need testing for C. Difficile Follow-up in 6 weeks   I discussed the assessment and treatment plan with the patient. The patient was provided an opportunity to ask questions and all were answered. The patient agreed with the plan and demonstrated an understanding of the instructions.   The patient was advised to  call back or seek an in-person evaluation if the symptoms worsen or if the condition fails to improve as anticipated.  I provided 16 minutes of non-face-to-face time during this encounter.   Michel Bickers, MD

## 2021-09-16 ENCOUNTER — Ambulatory Visit: Payer: Medicare Other | Admitting: Physician Assistant

## 2021-09-17 ENCOUNTER — Encounter: Payer: Self-pay | Admitting: Physician Assistant

## 2021-09-17 ENCOUNTER — Ambulatory Visit (INDEPENDENT_AMBULATORY_CARE_PROVIDER_SITE_OTHER): Payer: Medicare Other | Admitting: Physician Assistant

## 2021-09-17 ENCOUNTER — Other Ambulatory Visit (INDEPENDENT_AMBULATORY_CARE_PROVIDER_SITE_OTHER): Payer: Medicare Other

## 2021-09-17 VITALS — BP 96/54 | HR 55 | Ht 61.0 in | Wt 234.0 lb

## 2021-09-17 DIAGNOSIS — R159 Full incontinence of feces: Secondary | ICD-10-CM

## 2021-09-17 DIAGNOSIS — R194 Change in bowel habit: Secondary | ICD-10-CM

## 2021-09-17 DIAGNOSIS — R1084 Generalized abdominal pain: Secondary | ICD-10-CM | POA: Diagnosis not present

## 2021-09-17 DIAGNOSIS — R140 Abdominal distension (gaseous): Secondary | ICD-10-CM

## 2021-09-17 LAB — COMPREHENSIVE METABOLIC PANEL
ALT: 14 U/L (ref 0–35)
AST: 18 U/L (ref 0–37)
Albumin: 4.3 g/dL (ref 3.5–5.2)
Alkaline Phosphatase: 78 U/L (ref 39–117)
BUN: 29 mg/dL — ABNORMAL HIGH (ref 6–23)
CO2: 26 mEq/L (ref 19–32)
Calcium: 9.2 mg/dL (ref 8.4–10.5)
Chloride: 104 mEq/L (ref 96–112)
Creatinine, Ser: 1.33 mg/dL — ABNORMAL HIGH (ref 0.40–1.20)
GFR: 40.87 mL/min — ABNORMAL LOW (ref >60.00)
Glucose, Bld: 183 mg/dL — ABNORMAL HIGH (ref 70–99)
Potassium: 4.7 mEq/L (ref 3.5–5.1)
Sodium: 137 mEq/L (ref 135–145)
Total Bilirubin: 0.4 mg/dL (ref 0.2–1.2)
Total Protein: 6.8 g/dL (ref 6.0–8.3)

## 2021-09-17 LAB — CBC WITH DIFFERENTIAL/PLATELET
Basophils Absolute: 0 10*3/uL (ref 0.0–0.1)
Basophils Relative: 0.1 % (ref 0.0–3.0)
Eosinophils Absolute: 0 10*3/uL (ref 0.0–0.7)
Eosinophils Relative: 0 % (ref 0.0–5.0)
HCT: 31 % — ABNORMAL LOW (ref 36.0–46.0)
Hemoglobin: 10.4 g/dL — ABNORMAL LOW (ref 12.0–15.0)
Lymphocytes Relative: 32.6 % (ref 12.0–46.0)
Lymphs Abs: 0.7 10*3/uL (ref 0.7–4.0)
MCHC: 33.6 g/dL (ref 30.0–36.0)
MCV: 83.3 fl (ref 78.0–100.0)
Monocytes Absolute: 0.2 10*3/uL (ref 0.1–1.0)
Monocytes Relative: 8.2 % (ref 3.0–12.0)
Neutro Abs: 1.2 10*3/uL — ABNORMAL LOW (ref 1.4–7.7)
Neutrophils Relative %: 59.1 % (ref 43.0–77.0)
Platelets: 113 10*3/uL — ABNORMAL LOW (ref 150.0–400.0)
RBC: 3.72 Mil/uL — ABNORMAL LOW (ref 3.87–5.11)
RDW: 15.1 % (ref 11.5–15.5)
WBC: 2 10*3/uL — ABNORMAL LOW (ref 4.0–10.5)

## 2021-09-17 MED ORDER — HYOSCYAMINE SULFATE 0.125 MG SL SUBL: 0.1250 mg | SUBLINGUAL_TABLET | Freq: Four times a day (QID) | SUBLINGUAL | 2 refills | Status: DC | PRN
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
## 2021-09-17 NOTE — Patient Instructions (Addendum)
You will be contacted by [facility] in the next 2 days to arrange a CT Abdomen/Pelvis.  The number on your caller ID will be [phone], please answer when they call.  If you have not heard from them in 2 days please call [phone] to schedule.     You have been scheduled for a CT scan of the abdomen and pelvis at [facility], 1st floor Radiology. You are scheduled on ________  at ________. You should arrive 15 minutes prior to your appointment time for registration.  Please pick up 2 bottles of contrast from [facility] at least 3 days prior to your scan. The solution may taste better if refrigerated, but do NOT add ice or any other liquid to this solution. Shake well before drinking. ( We have given you the oral contrast in the office )  Please follow the written instructions below on the day of your exam:   1) Do not eat anything after ________ (4 hours prior to your test)   2) Drink 1 bottle of contrast @ _________ (2 hours prior to your exam)  Remember to shake well before drinking and do NOT pour over ice.     Drink 1 bottle of contrast @ _________ (1 hour prior to your exam)   You may take any medications as prescribed with a small amount of water, if necessary. If you take any of the following medications: METFORMIN, GLUCOPHAGE, GLUCOVANCE, AVANDAMET, RIOMET, FORTAMET, [place] MET, JANUMET, GLUMETZA or METAGLIP, you MAY be asked to HOLD this medication 48 hours AFTER the exam.   The purpose of you drinking the oral contrast is to aid in the visualization of your intestinal tract. The contrast solution may cause some diarrhea. Depending on your individual set of symptoms, you may also receive an intravenous injection of x-ray contrast/dye. Plan on being at [facility] for 45 minutes or longer, depending on the type of exam you are having performed.   If you have any questions regarding your exam or if you need to reschedule, you may call [facility]  at [phone] between the hours of 8:00 am and 5:00 pm, Monday-Friday.   Use a fiber supplement every day  Your provider has requested that you go to the basement level for lab work before leaving today. Press "B" on the elevator. The lab is located at the first door on the left as you exit the elevator.   We have sent the following medications to your pharmacy for you to pick up at your convenience:   Hyosyamine 0.125 mg  Due to recent changes in healthcare laws, you may see the results of your imaging and laboratory studies on MyChart before your provider has had a chance to review them.  We understand that in some cases there may be results that are confusing or concerning to you. Not all laboratory results come back in the same time frame and the provider may be waiting for multiple results in order to interpret others.  Please give [place] 48 hours in order for your provider to thoroughly review all the results before contacting the office for clarification of your results.    If you are age 90 or older, your body mass index should be between 23-30. Your Body mass index is 44.21 kg/m. If this is out of the aforementioned range listed, please consider follow up with your Primary Care Provider.  If you are age 51 or younger, your body mass index should be  between 19-25. Your Body mass index is 44.21 kg/m. If this is out of the aformentioned range listed, please consider follow up with your Primary Care Provider.   ________________________________________________________  The [facility] providers would like to encourage you to use [facility] to communicate with providers for non-urgent requests or questions.  Due to long hold times on the telephone, sending your provider a message by [facility] may be a faster and more efficient way to get a response.  Please allow 48 business hours for a response.  Please remember that this is for non-urgent requests.   _______________________________________________________   I appreciate the  opportunity to care for you  Thank You   Lovett Calender

## 2021-09-21 NOTE — Progress Notes (Signed)
I agree with the above note, plan 

## 2021-10-06 ENCOUNTER — Encounter (HOSPITAL_COMMUNITY): Payer: Self-pay

## 2021-10-06 ENCOUNTER — Ambulatory Visit (HOSPITAL_COMMUNITY)
Admission: RE | Admit: 2021-10-06 | Discharge: 2021-10-06 | Disposition: A | Discharge status: Home / Self Care | Payer: Medicare Other | Source: Ambulatory Visit | Attending: Physician Assistant | Admitting: Physician Assistant

## 2021-10-06 DIAGNOSIS — R159 Full incontinence of feces: Secondary | ICD-10-CM | POA: Diagnosis not present

## 2021-10-06 DIAGNOSIS — N200 Calculus of kidney: Secondary | ICD-10-CM | POA: Diagnosis not present

## 2021-10-06 DIAGNOSIS — R194 Change in bowel habit: Secondary | ICD-10-CM | POA: Insufficient documentation

## 2021-10-06 DIAGNOSIS — R140 Abdominal distension (gaseous): Secondary | ICD-10-CM | POA: Insufficient documentation

## 2021-10-06 DIAGNOSIS — R1084 Generalized abdominal pain: Secondary | ICD-10-CM | POA: Diagnosis not present

## 2021-10-06 DIAGNOSIS — K8689 Other specified diseases of pancreas: Secondary | ICD-10-CM | POA: Diagnosis not present

## 2021-10-06 DIAGNOSIS — K6389 Other specified diseases of intestine: Secondary | ICD-10-CM | POA: Diagnosis not present

## 2021-10-06 MED ORDER — SODIUM CHLORIDE (PF) 0.9 % IJ SOLN
INTRAMUSCULAR | Status: AC
  Filled 2021-10-06: qty 50

## 2021-10-06 MED ORDER — IOHEXOL 300 MG/ML  SOLN
80.0000 mL | Freq: Once | INTRAMUSCULAR | Status: AC | PRN
  Administered 2021-10-06: 80 mL via INTRAVENOUS

## 2021-10-12 ENCOUNTER — Telehealth: Payer: Self-pay | Admitting: Physician Assistant

## 2021-10-12 NOTE — Telephone Encounter (Signed)
[employer] is Tour manager. I will forward to her.

## 2021-10-13 ENCOUNTER — Other Ambulatory Visit: Payer: Self-pay

## 2021-10-13 DIAGNOSIS — R932 Abnormal findings on diagnostic imaging of liver and biliary tract: Secondary | ICD-10-CM

## 2021-10-13 DIAGNOSIS — R1084 Generalized abdominal pain: Secondary | ICD-10-CM

## 2021-10-13 DIAGNOSIS — R140 Abdominal distension (gaseous): Secondary | ICD-10-CM

## 2021-10-13 DIAGNOSIS — R194 Change in bowel habit: Secondary | ICD-10-CM

## 2021-10-13 DIAGNOSIS — R159 Full incontinence of feces: Secondary | ICD-10-CM

## 2021-10-13 NOTE — Telephone Encounter (Signed)
Spoke with patient. See 10/06/21 CT result note. Thanks

## 2021-10-14 ENCOUNTER — Other Ambulatory Visit: Payer: Medicare Other

## 2021-10-15 ENCOUNTER — Ambulatory Visit (INDEPENDENT_AMBULATORY_CARE_PROVIDER_SITE_OTHER): Payer: Medicare Other | Admitting: Internal Medicine

## 2021-10-15 ENCOUNTER — Encounter: Payer: Self-pay | Admitting: Internal Medicine

## 2021-10-15 ENCOUNTER — Other Ambulatory Visit: Payer: Self-pay

## 2021-10-15 DIAGNOSIS — M4646 Discitis, unspecified, lumbar region: Secondary | ICD-10-CM

## 2021-10-15 NOTE — Progress Notes (Signed)
Virtual Visit via Telephone Note  I connected with [name] on 10/15/21 at  8:45 AM EDT by telephone and verified that I am speaking with the correct person using two identifiers.  Location: Patient: Home Provider: [facility]   I discussed the limitations, risks, security and privacy concerns of performing an evaluation and management service by telephone and the availability of in person appointments. I also discussed with the patient that there may be a patient responsible charge related to this service. The patient expressed understanding and agreed to proceed.   History of Present Illness: I called and spoke with [name] this morning.  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  She had remained on chronic, suppressive doxycycline until she developed diarrhea in early June.  She stopped doxycycline on 09/02/2021.  Her diarrhea has improved.  She has had a slight increase in back pain since that time.  Her pain is intermittent.  On occasion will get up to about 4 out of 10.  She has not required any pain medication.  The pain has not been getting progressively worse over the past month.  She has not had any fever.   Observations/Objective:   Assessment and Plan: I doubt that her intermittent pain is due to relapse of her staph infection.  She will stay off of doxycycline and I will arrange follow-up in 1 month.  Follow Up Instructions: Continue observation off of doxycycline Phone follow-up on 11/18/2021   I discussed the assessment and treatment plan with the patient. The patient was provided an opportunity to ask questions and all were answered. The patient agreed with the plan and demonstrated an understanding of the instructions.   The patient was advised to call back or seek an in-person evaluation if the symptoms worsen or if the condition fails to improve as anticipated.  I provided 15 minutes of non-face-to-face time during  this encounter.   Michel Bickers, MD

## 2021-11-03 ENCOUNTER — Ambulatory Visit (HOSPITAL_COMMUNITY)
Admission: RE | Admit: 2021-11-03 | Discharge: 2021-11-03 | Disposition: A | Discharge status: Home / Self Care | Payer: Medicare Other | Source: Ambulatory Visit | Attending: Physician Assistant | Admitting: Physician Assistant

## 2021-11-03 ENCOUNTER — Ambulatory Visit (HOSPITAL_COMMUNITY): Payer: Medicare Other

## 2021-11-03 DIAGNOSIS — K760 Fatty (change of) liver, not elsewhere classified: Secondary | ICD-10-CM | POA: Diagnosis not present

## 2021-11-03 DIAGNOSIS — R932 Abnormal findings on diagnostic imaging of liver and biliary tract: Secondary | ICD-10-CM | POA: Diagnosis present

## 2021-11-03 DIAGNOSIS — K7460 Unspecified cirrhosis of liver: Secondary | ICD-10-CM | POA: Insufficient documentation

## 2021-11-03 DIAGNOSIS — K7400 Hepatic fibrosis, unspecified: Secondary | ICD-10-CM | POA: Diagnosis not present

## 2021-11-04 DIAGNOSIS — I10 Essential (primary) hypertension: Secondary | ICD-10-CM | POA: Diagnosis not present

## 2021-11-04 DIAGNOSIS — E538 Deficiency of other specified B group vitamins: Secondary | ICD-10-CM | POA: Diagnosis not present

## 2021-11-04 DIAGNOSIS — D509 Iron deficiency anemia, unspecified: Secondary | ICD-10-CM | POA: Diagnosis not present

## 2021-11-04 DIAGNOSIS — E559 Vitamin D deficiency, unspecified: Secondary | ICD-10-CM | POA: Diagnosis not present

## 2021-11-04 DIAGNOSIS — E785 Hyperlipidemia, unspecified: Secondary | ICD-10-CM | POA: Diagnosis not present

## 2021-11-04 DIAGNOSIS — E1129 Type 2 diabetes mellitus with other diabetic kidney complication: Secondary | ICD-10-CM | POA: Diagnosis not present

## 2021-11-04 DIAGNOSIS — R5383 Other fatigue: Secondary | ICD-10-CM | POA: Diagnosis not present

## 2021-11-04 DIAGNOSIS — F419 Anxiety disorder, unspecified: Secondary | ICD-10-CM | POA: Diagnosis not present

## 2021-11-04 DIAGNOSIS — D649 Anemia, unspecified: Secondary | ICD-10-CM | POA: Diagnosis not present

## 2021-11-04 DIAGNOSIS — R7989 Other specified abnormal findings of blood chemistry: Secondary | ICD-10-CM | POA: Diagnosis not present

## 2021-11-04 DIAGNOSIS — E039 Hypothyroidism, unspecified: Secondary | ICD-10-CM | POA: Diagnosis not present

## 2021-11-05 ENCOUNTER — Other Ambulatory Visit: Payer: Self-pay

## 2021-11-05 DIAGNOSIS — R932 Abnormal findings on diagnostic imaging of liver and biliary tract: Secondary | ICD-10-CM

## 2021-11-05 DIAGNOSIS — D696 Thrombocytopenia, unspecified: Secondary | ICD-10-CM

## 2021-11-05 DIAGNOSIS — R160 Hepatomegaly, not elsewhere classified: Secondary | ICD-10-CM

## 2021-11-05 DIAGNOSIS — R161 Splenomegaly, not elsewhere classified: Secondary | ICD-10-CM

## 2021-11-06 ENCOUNTER — Other Ambulatory Visit: Payer: Medicare Other

## 2021-11-06 DIAGNOSIS — R194 Change in bowel habit: Secondary | ICD-10-CM

## 2021-11-06 DIAGNOSIS — R140 Abdominal distension (gaseous): Secondary | ICD-10-CM

## 2021-11-06 DIAGNOSIS — R1084 Generalized abdominal pain: Secondary | ICD-10-CM | POA: Diagnosis not present

## 2021-11-06 DIAGNOSIS — R159 Full incontinence of feces: Secondary | ICD-10-CM

## 2021-11-08 LAB — GI PROFILE, STOOL, PCR

## 2021-11-09 ENCOUNTER — Telehealth: Payer: Self-pay

## 2021-11-09 NOTE — Progress Notes (Unsigned)
Suttle, Tiffany Ashing, MD  Donita Brooks D Never hurts to ask! Approved for US guided non-focal liver biopsy.   [name]

## 2021-11-09 NOTE — Telephone Encounter (Signed)
Liver biopsy scheduled at [facility] on Wednesday, 11/25/21 at 1:00 pm.

## 2021-11-11 DIAGNOSIS — M4646 Discitis, unspecified, lumbar region: Secondary | ICD-10-CM | POA: Diagnosis not present

## 2021-11-11 DIAGNOSIS — Z23 Encounter for immunization: Secondary | ICD-10-CM | POA: Diagnosis not present

## 2021-11-11 DIAGNOSIS — R944 Abnormal results of kidney function studies: Secondary | ICD-10-CM | POA: Diagnosis not present

## 2021-11-11 DIAGNOSIS — E1169 Type 2 diabetes mellitus with other specified complication: Secondary | ICD-10-CM | POA: Diagnosis not present

## 2021-11-11 DIAGNOSIS — E0590 Thyrotoxicosis, unspecified without thyrotoxic crisis or storm: Secondary | ICD-10-CM | POA: Diagnosis not present

## 2021-11-11 DIAGNOSIS — R82998 Other abnormal findings in urine: Secondary | ICD-10-CM | POA: Diagnosis not present

## 2021-11-11 DIAGNOSIS — I129 Hypertensive chronic kidney disease with stage 1 through stage 4 chronic kidney disease, or unspecified chronic kidney disease: Secondary | ICD-10-CM | POA: Diagnosis not present

## 2021-11-11 DIAGNOSIS — D509 Iron deficiency anemia, unspecified: Secondary | ICD-10-CM | POA: Diagnosis not present

## 2021-11-11 DIAGNOSIS — N1831 Chronic kidney disease, stage 3a: Secondary | ICD-10-CM | POA: Diagnosis not present

## 2021-11-11 DIAGNOSIS — M1990 Unspecified osteoarthritis, unspecified site: Secondary | ICD-10-CM | POA: Diagnosis not present

## 2021-11-11 DIAGNOSIS — I10 Essential (primary) hypertension: Secondary | ICD-10-CM | POA: Diagnosis not present

## 2021-11-11 DIAGNOSIS — Z0000 Encounter for general adult medical examination without abnormal findings: Secondary | ICD-10-CM | POA: Diagnosis not present

## 2021-11-11 DIAGNOSIS — D72819 Decreased white blood cell count, unspecified: Secondary | ICD-10-CM | POA: Diagnosis not present

## 2021-11-11 DIAGNOSIS — J45909 Unspecified asthma, uncomplicated: Secondary | ICD-10-CM | POA: Diagnosis not present

## 2021-11-11 DIAGNOSIS — D61818 Other pancytopenia: Secondary | ICD-10-CM | POA: Diagnosis not present

## 2021-11-11 DIAGNOSIS — E1129 Type 2 diabetes mellitus with other diabetic kidney complication: Secondary | ICD-10-CM | POA: Diagnosis not present

## 2021-11-13 ENCOUNTER — Telehealth: Payer: Self-pay | Admitting: Hematology and Oncology

## 2021-11-13 NOTE — Telephone Encounter (Signed)
Patient request to scheduled an appointment per her recent lab work done at [facility] office. Patient is aware of appointment details.

## 2021-11-18 ENCOUNTER — Other Ambulatory Visit: Payer: Self-pay

## 2021-11-18 ENCOUNTER — Encounter: Payer: Self-pay | Admitting: Internal Medicine

## 2021-11-18 ENCOUNTER — Ambulatory Visit (INDEPENDENT_AMBULATORY_CARE_PROVIDER_SITE_OTHER): Payer: Medicare Other | Admitting: Internal Medicine

## 2021-11-18 DIAGNOSIS — M4646 Discitis, unspecified, lumbar region: Secondary | ICD-10-CM

## 2021-11-18 NOTE — Progress Notes (Signed)
Virtual Visit via Telephone Note  I connected with [name] on 11/18/21 at 10:45 AM EDT by telephone and verified that I am speaking with the correct person using two identifiers.  Location: Patient: Home Provider: [facility]   I discussed the limitations, risks, security and privacy concerns of performing an evaluation and management service by telephone and the availability of in person appointments. I also discussed with the patient that there may be a patient responsible charge related to this service. The patient expressed understanding and agreed to proceed.   History of Present Illness: I called and spoke with [name] today.  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  She was treated with IV antibiotics before converting to long-term suppressive doxycycline therapy.  She stopped taking doxycycline this past June after she developed diarrhea.  Her diarrhea improved promptly.  She has not had any change in her chronic, intermittent back pain.  She does have some numbness and tingling in her legs that she believes is a "nerve problem".  She is scheduled to see Dr. Ellene Route, her neurosurgeon in November.   Observations/Objective:   Assessment and Plan: I am quite hopeful that her methicillin-resistant coag negative staph lumbar infection has been cured through a combination of surgery and a very prolonged course of antibiotics.  Follow Up Instructions: Continue observation off of antibiotics Follow-up here as needed   I discussed the assessment and treatment plan with the patient. The patient was provided an opportunity to ask questions and all were answered. The patient agreed with the plan and demonstrated an understanding of the instructions.   The patient was advised to call back or seek an in-person evaluation if the symptoms worsen or if the condition fails to improve as anticipated.  I provided 15  minutes of non-face-to-face time during this encounter.   Michel Bickers, MD

## 2021-11-19 ENCOUNTER — Other Ambulatory Visit: Payer: Self-pay | Admitting: Radiology

## 2021-11-19 DIAGNOSIS — R945 Abnormal results of liver function studies: Secondary | ICD-10-CM

## 2021-11-23 ENCOUNTER — Other Ambulatory Visit: Payer: Self-pay | Admitting: Physician Assistant

## 2021-11-23 DIAGNOSIS — D508 Other iron deficiency anemias: Secondary | ICD-10-CM

## 2021-11-24 ENCOUNTER — Other Ambulatory Visit: Payer: Self-pay

## 2021-11-24 ENCOUNTER — Inpatient Hospital Stay: Payer: Medicare Other

## 2021-11-24 ENCOUNTER — Inpatient Hospital Stay: Payer: Medicare Other | Attending: Physician Assistant | Admitting: Physician Assistant

## 2021-11-24 VITALS — BP 145/45 | HR 64 | Temp 98.1°F | Wt 245.7 lb

## 2021-11-24 DIAGNOSIS — D509 Iron deficiency anemia, unspecified: Secondary | ICD-10-CM | POA: Diagnosis not present

## 2021-11-24 DIAGNOSIS — Z9884 Bariatric surgery status: Secondary | ICD-10-CM | POA: Insufficient documentation

## 2021-11-24 DIAGNOSIS — D72819 Decreased white blood cell count, unspecified: Secondary | ICD-10-CM | POA: Diagnosis not present

## 2021-11-24 DIAGNOSIS — D696 Thrombocytopenia, unspecified: Secondary | ICD-10-CM | POA: Diagnosis not present

## 2021-11-24 DIAGNOSIS — D61818 Other pancytopenia: Secondary | ICD-10-CM | POA: Diagnosis not present

## 2021-11-24 DIAGNOSIS — D508 Other iron deficiency anemias: Secondary | ICD-10-CM

## 2021-11-24 DIAGNOSIS — Z8551 Personal history of malignant neoplasm of bladder: Secondary | ICD-10-CM | POA: Diagnosis not present

## 2021-11-24 LAB — RETIC PANEL
Immature Retic Fract: 17.4 % — ABNORMAL HIGH (ref 2.3–15.9)
RBC.: 3.57 MIL/uL — ABNORMAL LOW (ref 3.87–5.11)
Retic Count, Absolute: 93.9 10*3/uL (ref 19.0–186.0)
Retic Ct Pct: 2.6 % (ref 0.4–3.1)
Reticulocyte Hemoglobin: 29.5 pg (ref >27.9)

## 2021-11-24 LAB — CMP (CANCER CENTER ONLY)
ALT: 13 U/L (ref 0–44)
AST: 17 U/L (ref 15–41)
Albumin: 4.1 g/dL (ref 3.5–5.0)
Alkaline Phosphatase: 80 U/L (ref 38–126)
Anion gap: 6 (ref 5–15)
BUN: 22 mg/dL (ref 8–23)
CO2: 26 mmol/L (ref 22–32)
Calcium: 9.6 mg/dL (ref 8.9–10.3)
Chloride: 106 mmol/L (ref 98–111)
Creatinine: 1.07 mg/dL — ABNORMAL HIGH (ref 0.44–1.00)
GFR, Estimated: 56 mL/min — ABNORMAL LOW (ref >60)
Glucose, Bld: 186 mg/dL — ABNORMAL HIGH (ref 70–99)
Potassium: 4.5 mmol/L (ref 3.5–5.1)
Sodium: 138 mmol/L (ref 135–145)
Total Bilirubin: 0.4 mg/dL (ref 0.3–1.2)
Total Protein: 7.1 g/dL (ref 6.5–8.1)

## 2021-11-24 LAB — CBC WITH DIFFERENTIAL (CANCER CENTER ONLY)
Abs Immature Granulocytes: 0 10*3/uL (ref 0.00–0.07)
Basophils Absolute: 0 10*3/uL (ref 0.0–0.1)
Basophils Relative: 0 %
Eosinophils Absolute: 0 10*3/uL (ref 0.0–0.5)
Eosinophils Relative: 0 %
HCT: 29.2 % — ABNORMAL LOW (ref 36.0–46.0)
Hemoglobin: 10 g/dL — ABNORMAL LOW (ref 12.0–15.0)
Immature Granulocytes: 0 %
Lymphocytes Relative: 38 %
Lymphs Abs: 0.7 10*3/uL (ref 0.7–4.0)
MCH: 28 pg (ref 26.0–34.0)
MCHC: 34.2 g/dL (ref 30.0–36.0)
MCV: 81.8 fL (ref 80.0–100.0)
Monocytes Absolute: 0.2 10*3/uL (ref 0.1–1.0)
Monocytes Relative: 10 %
Neutro Abs: 0.9 10*3/uL — ABNORMAL LOW (ref 1.7–7.7)
Neutrophils Relative %: 52 %
Platelet Count: 128 10*3/uL — ABNORMAL LOW (ref 150–400)
RBC: 3.57 MIL/uL — ABNORMAL LOW (ref 3.87–5.11)
RDW: 15.2 % (ref 11.5–15.5)
WBC Count: 1.7 10*3/uL — ABNORMAL LOW (ref 4.0–10.5)
nRBC: 0 % (ref 0.0–0.2)

## 2021-11-24 LAB — IRON AND IRON BINDING CAPACITY (CC-WL,HP ONLY)
Iron: 49 ug/dL (ref 28–170)
Saturation Ratios: 13 % (ref 10.4–31.8)
TIBC: 391 ug/dL (ref 250–450)
UIBC: 342 ug/dL (ref 148–442)

## 2021-11-25 ENCOUNTER — Ambulatory Visit (HOSPITAL_COMMUNITY)
Admission: RE | Admit: 2021-11-25 | Discharge: 2021-11-25 | Disposition: A | Discharge status: Home / Self Care | Payer: Medicare Other | Source: Ambulatory Visit | Attending: Physician Assistant | Admitting: Physician Assistant

## 2021-11-25 DIAGNOSIS — E785 Hyperlipidemia, unspecified: Secondary | ICD-10-CM | POA: Diagnosis not present

## 2021-11-25 DIAGNOSIS — G4733 Obstructive sleep apnea (adult) (pediatric): Secondary | ICD-10-CM | POA: Insufficient documentation

## 2021-11-25 DIAGNOSIS — R945 Abnormal results of liver function studies: Secondary | ICD-10-CM | POA: Insufficient documentation

## 2021-11-25 DIAGNOSIS — R160 Hepatomegaly, not elsewhere classified: Secondary | ICD-10-CM | POA: Insufficient documentation

## 2021-11-25 DIAGNOSIS — Z794 Long term (current) use of insulin: Secondary | ICD-10-CM | POA: Insufficient documentation

## 2021-11-25 DIAGNOSIS — M797 Fibromyalgia: Secondary | ICD-10-CM | POA: Diagnosis not present

## 2021-11-25 DIAGNOSIS — I10 Essential (primary) hypertension: Secondary | ICD-10-CM | POA: Insufficient documentation

## 2021-11-25 DIAGNOSIS — K7402 Hepatic fibrosis, advanced fibrosis: Secondary | ICD-10-CM | POA: Diagnosis not present

## 2021-11-25 DIAGNOSIS — K7581 Nonalcoholic steatohepatitis (NASH): Secondary | ICD-10-CM | POA: Diagnosis not present

## 2021-11-25 DIAGNOSIS — F32A Depression, unspecified: Secondary | ICD-10-CM | POA: Diagnosis not present

## 2021-11-25 DIAGNOSIS — R161 Splenomegaly, not elsewhere classified: Secondary | ICD-10-CM | POA: Diagnosis not present

## 2021-11-25 DIAGNOSIS — D696 Thrombocytopenia, unspecified: Secondary | ICD-10-CM | POA: Insufficient documentation

## 2021-11-25 DIAGNOSIS — M1990 Unspecified osteoarthritis, unspecified site: Secondary | ICD-10-CM | POA: Insufficient documentation

## 2021-11-25 DIAGNOSIS — E118 Type 2 diabetes mellitus with unspecified complications: Secondary | ICD-10-CM | POA: Insufficient documentation

## 2021-11-25 DIAGNOSIS — K7460 Unspecified cirrhosis of liver: Secondary | ICD-10-CM | POA: Diagnosis not present

## 2021-11-25 DIAGNOSIS — E669 Obesity, unspecified: Secondary | ICD-10-CM | POA: Diagnosis not present

## 2021-11-25 DIAGNOSIS — R932 Abnormal findings on diagnostic imaging of liver and biliary tract: Secondary | ICD-10-CM | POA: Insufficient documentation

## 2021-11-25 DIAGNOSIS — G2581 Restless legs syndrome: Secondary | ICD-10-CM | POA: Insufficient documentation

## 2021-11-25 DIAGNOSIS — E0500 Thyrotoxicosis with diffuse goiter without thyrotoxic crisis or storm: Secondary | ICD-10-CM | POA: Diagnosis not present

## 2021-11-25 DIAGNOSIS — Z0389 Encounter for observation for other suspected diseases and conditions ruled out: Secondary | ICD-10-CM | POA: Diagnosis not present

## 2021-11-25 LAB — FERRITIN: Ferritin: 31 ng/mL (ref 11–307)

## 2021-11-25 LAB — CBC
HCT: 29.4 % — ABNORMAL LOW (ref 36.0–46.0)
Hemoglobin: 9.6 g/dL — ABNORMAL LOW (ref 12.0–15.0)
MCH: 26.9 pg (ref 26.0–34.0)
MCHC: 32.7 g/dL (ref 30.0–36.0)
MCV: 82.4 fL (ref 80.0–100.0)
Platelets: 130 10*3/uL — ABNORMAL LOW (ref 150–400)
RBC: 3.57 MIL/uL — ABNORMAL LOW (ref 3.87–5.11)
RDW: 15 % (ref 11.5–15.5)
WBC: 1.4 10*3/uL — CL (ref 4.0–10.5)
nRBC: 0 % (ref 0.0–0.2)

## 2021-11-25 LAB — PROTIME-INR
INR: 1.1 (ref 0.8–1.2)
Prothrombin Time: 14.1 seconds (ref 11.4–15.2)

## 2021-11-25 MED ORDER — FENTANYL CITRATE (PF) 100 MCG/2ML IJ SOLN
INTRAMUSCULAR | Status: AC
  Filled 2021-11-25: qty 4

## 2021-11-25 MED ORDER — FENTANYL CITRATE (PF) 100 MCG/2ML IJ SOLN
INTRAMUSCULAR | Status: AC | PRN
  Administered 2021-11-25: 50 ug via INTRAVENOUS

## 2021-11-25 MED ORDER — MIDAZOLAM HCL 2 MG/2ML IJ SOLN
INTRAMUSCULAR | Status: AC | PRN
  Administered 2021-11-25: 1 mg via INTRAVENOUS

## 2021-11-25 MED ORDER — LIDOCAINE HCL (PF) 1 % IJ SOLN
INTRAMUSCULAR | Status: AC
  Filled 2021-11-25: qty 30

## 2021-11-25 MED ORDER — SODIUM CHLORIDE 0.9 % IV SOLN: INTRAVENOUS | Status: DC

## 2021-11-25 MED ORDER — GELATIN ABSORBABLE 12-7 MM EX MISC
CUTANEOUS | Status: AC
  Filled 2021-11-25: qty 1

## 2021-11-25 MED ORDER — MIDAZOLAM HCL 2 MG/2ML IJ SOLN
INTRAMUSCULAR | Status: AC
  Filled 2021-11-25: qty 4

## 2021-11-25 NOTE — H&P (Signed)
Referring Physician(s): Lemmon,Jennifer Sula Soda  Supervising Physician: Corrie Mckusick  Patient Status:  [facility] OP  Chief Complaint:  "I'm here for a liver biopsy"  Subjective: Pt known to IR service from PICC placement in 2013, attempted right nephrostomy placement in 2015, L1-2 disc space aspiration 2020, left psoas muscle aspiration in 2020. She has a PMH sig for anemia, obesity, arthritis, Bell's palsy, depression, DM, HLD, neutropenia, fibromyalgia, Graves dz, nephrolithiasis, HTN, OSA, RLS, prior MRSA lumbar infection. She presents now with marked hepatomegaly/? early cirrhosis, splenomegaly by imaging, thrombocytopenia and liver elastography revealing:   ULTRASOUND LIVER:   Coarsened echogenic hepatic parenchyma, can be seen with fatty infiltration and cirrhosis.   Due to limitations of exam, unable to establish patency and direction of blood flow of the portal vein Parent   ULTRASOUND HEPATIC ELASTOGRAPHY:   Median kPa:  5.7   Diagnostic category: < or = 9 kPa: in the absence of other known clinical signs, rules out cACLD  She is scheduled today for US guided random core liver biopsy for further evaluation. She currently denies fever,HA,CP,dyspnea, cough, abd/back pain,N/V or bleeding.    Past Medical History:  Diagnosis Date   Anemia    Arthritis    Asthma    OCCAS   Bell's palsy    Broken neck (HCC)    C-1   Carbuncle and furuncle of trunk    Chronic dislocation of right shoulder    Depression    Disorder of fascia    HX OF NECROTIC FASCITIS AFTER ABDOMINAL SURGERY FOR HERNIA- REQUIRED 19 SURGERIES AND 2.5 MONTH HOSPITALIZATION AT [facility]   DM (diabetes mellitus) ([place])    Dysrhythmia    HX OF TACHYCARDIA AND BRADYCARDIA - ON GOING FOR YEARS - DOES NOT HAVE TO SEE CARDIOLOGIST   Elevated cholesterol    Fibromyalgia    Fracture MAY 2014   HX OF FRACTURED NECK C1- CAUSES SEVERE HEADACHES--LIMITED ROM NECK   Frequent infections    ESPECIALLY PRONE TO  INFECTIONS AFTER SURGERIES   Graves disease    Heart murmur    DOES NOT CAUSE ANY PROBLEMS   History of kidney stones    Hyperlipidemia    Hypertension    Hypothyroidism    GRAVES DISEASE   Migraine    Nephrolithiasis    STAGE 3    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   Neuropathy    Obesity    OSA (obstructive sleep apnea)    USES CPAP - DOES NOT KNOW SETTING   Pain    LEFT SHOULDER  PAIN -HARD TO LIE ON LEFT SIDE FOR LONG PERIOD;  PAIN IN LOWER BADK - 3 HERNIATED DISCS AND STENOISIS -   PONV (postoperative nausea and vomiting)    THE GAS MAKES ME NAUSEATED   Restless leg syndrome    Sciatica    Tachycardia    Urinary frequency    Urticaria    UTI (urinary tract infection)    Past Surgical History:  Procedure Laterality Date   ABDOMINAL HYSTERECTOMY     LARGE TUMOR AT OVARY REMOVED   ANTERIOR LAT LUMBAR FUSION N/A 07/28/2018   Procedure: Anterolateral Decompression Lumbar One-Two for osteomyelitis reconstruction w/titanium strut allograft Fusion Lumbar One-Two;  Surgeon: Kristeen Miss, MD;  Location: [place];  Service: Neurosurgery;  Laterality: N/A;  Left anterolateral approach   APPENDECTOMY     APPLICATION OF ROBOTIC ASSISTANCE FOR SPINAL PROCEDURE N/A 07/28/2018   Procedure: APPLICATION OF ROBOTIC ASSISTANCE FOR SPINAL PROCEDURE;  Surgeon: Ellene Route,  Mallie Mussel, MD;  Location: [place];  Service: Neurosurgery;  Laterality: N/A;   CARPAL TUNNEL RELEASE     BILATERAL   CATARACT EXTRACTION W/ INTRAOCULAR LENS  IMPLANT, BILATERAL     CESAREAN SECTION     X 3   CHOLECYSTECTOMY     CYSTOSCOPY WITH URETEROSCOPY Right 12/03/2013   Procedure: CYSTOSCOPY WITH RIGHT RETROGRADE URETEROSCOPY LASER LITHOTRIPSY RIGHT STONE RIGHT URETERAL STENT, ;  Surgeon: Raynelle Bring, MD;  Location: [facility];  Service: Urology;  Laterality: Right;  PROCEDURE WAS ORIGINALLY SCHEDULED AS RIGHT PERCUTANEOUS NEPHROLITHOTOMY   EYELID LACERATION REPAIR     RIGHT EYE   HERNIA REPAIR     ABDOMINAL HERNIA REPAIR WITH MESH - 3  SURGERIES    HX OF 19 SURGERIES FOR NECROTIC FASCITIS     SKIN GRAFTS+ WOUND  VAC   I&D OF INFECTED SITE IN [place] - FROM AN INJECTION     IR FLUORO GUIDED NEEDLE PLC ASPIRATION/INJECTION LOC  06/03/2018   JOINT REPLACEMENT     TOTAL RIGHT KNEE REPLACEMENT   KNEE ARTHROSCOPY  RIGHT AND LEFT   X 2   LAPAROSCOPIC GASTRIC SLEEVE RESECTION  2014   NEPHROLITHOTOMY Right 12/10/2013   Procedure: NEPHROLITHOTOMY PERCUTANEOUS;  Surgeon: Raynelle Bring, MD;  Location: [facility];  Service: Urology;  Laterality: Right;   POSTERIOR LUMBAR FUSION 4 LEVEL N/A 07/28/2018   Procedure: Posterior Fixation Thoracic Ten-Lumbar Four with pedicle augmentaion with robotic assistance;  Surgeon: Kristeen Miss, MD;  Location: [place];  Service: Neurosurgery;  Laterality: N/A;  Posterior Fixation Thoracic Ten-Lumbar Four with pedicle augmentaion with robotic assistance   REVERSE SHOULDER ARTHROPLASTY Right 07/21/2017   REVERSE SHOULDER ARTHROPLASTY Right 07/21/2017   Procedure: RIGHT REVERSE SHOULDER ARTHROPLASTY;  Surgeon: Tania Ade, MD;  Location: [place];  Service: Orthopedics;  Laterality: Right;   RIGHT FOOT DRAINAGE OF INFECTION     shoulder arthroscopy Right    X 2   TONSILLECTOMY     AND ADENOIDECTOMY         Allergies: Imitrex [sumatriptan], Nsaids, Statins, Sulfonamide derivatives, Methimazole, Niacin, Codeine, Promethazine hcl, and Vicodin [hydrocodone-acetaminophen]  Medications: Prior to Admission medications   Medication Sig Start Date End Date Taking? Authorizing Provider  acetaminophen (TYLENOL) 650 MG CR tablet Take 1,300 mg by mouth every 8 (eight) hours as needed for pain.   Yes [provider]  amLODipine (NORVASC) 5 MG tablet Take 5 mg by mouth at bedtime.   Yes [provider]  Cholecalciferol (DIALYVITE VITAMIN D 5000) 125 MCG (5000 UT) capsule Take 5,000 Units by mouth daily.   Yes [provider]  clonazePAM (KLONOPIN) 0.5 MG tablet Take 0.5 mg by mouth See admin  instructions. Take 0.5 mg at bedtime, may take an additional 0.5 mg tablet twice daily as needed for anxiety 07/01/14  Yes [provider]  cyanocobalamin (VITAMIN B12) 1000 MCG tablet Take 1,000 mcg by mouth 3 (three) times a week.   Yes [provider]  doxazosin (CARDURA) 2 MG tablet Take 2 mg by mouth daily. 12/04/18  Yes [provider]  fenofibrate 54 MG tablet Take 54 mg by mouth daily with supper. 06/20/17  Yes [provider]  FLUoxetine (PROZAC) 20 MG capsule Take 60 mg by mouth at bedtime.  06/28/17  Yes [provider]  gabapentin (NEURONTIN) 300 MG capsule Take 1 capsule (300 mg total) by mouth 3 (three) times daily for 30 days. Patient taking differently: Take 300 mg by mouth 3 (three) times daily as  needed (pain). 07/31/18 11/24/21 Yes Donne Hazel, MD  insulin aspart (NOVOLOG) 100 UNIT/ML FlexPen Sliding scale dose: CBG < 70: implement hypoglycemia protocol  CBG 70 - 120: 0 units  CBG 121 - 150: 0 units  CBG 151 - 200: 0 units  CBG 201 - 250: 2 units  CBG 251 - 300: 3 units  CBG 301 - 350: 4 units  CBG 351 - 400: 5 units Patient taking differently: Inject 10-20 Units into the skin 2 (two) times daily as needed for high blood sugar. 07/31/18  Yes Donne Hazel, MD  insulin detemir (LEVEMIR) 100 UNIT/ML injection Inject 55 Units into the skin 2 (two) times daily.   Yes [provider]  Lancets Misc. (UNISTIK 3 COMFORT) MISC USE LANCETS TO CHECK BLOOD GLUCOSE BID 11/30/18  Yes [provider]  metoprolol tartrate (LOPRESSOR) 50 MG tablet Take 50 mg by mouth 3 (three) times daily. 12/04/18  Yes [provider]  omeprazole (PRILOSEC) 20 MG capsule Take 20 mg by mouth daily with supper.    Yes [provider]  oxybutynin (DITROPAN-XL) 10 MG 24 hr tablet Take 10 mg by mouth daily. 06/18/19  Yes [provider]  propylthiouracil (PTU) 50 MG tablet Take 50 mg by mouth daily. 12/22/16  Yes [provider]  rOPINIRole (REQUIP) 2 MG tablet Take 2 mg by mouth See admin instructions. Take 2 mg at bedtime, may take a second 2 mg dose as needed for restless legs   Yes [provider]  SYMBICORT 160-4.5 MCG/ACT inhaler Inhale 2 puffs into the lungs 2 (two) times daily. 11/11/21  Yes [provider]  BD INSULIN SYRINGE U/F 31G X 5/16" 0.5 ML MISC USE 5 TIMES A DAY LEVEMIR AND NOVOLOG 12/13/18   [provider]  BD PEN NEEDLE MICRO U/F 32G X 6 MM MISC Inject 32 pens into the skin daily. 06/18/19   [provider]  FREESTYLE TEST STRIPS test strip USE STRIPS TO TEST BLOOD GLUCOSE BID 10/03/18   [provider]  hyoscyamine (LEVSIN SL) 0.125 MG SL tablet Place 1 tablet (0.125 mg total) under the tongue every 6 (six) hours as needed. 09/17/21   Levin Erp, PA  levocetirizine (XYZAL) 5 MG tablet Take 5 mg by mouth daily as needed for allergies.    [provider]  ondansetron (ZOFRAN) 8 MG tablet Take 8 mg by mouth every 8 (eight) hours as needed for nausea or vomiting.    [provider]  QUEtiapine (SEROQUEL) 50 MG tablet Take 50 mg by mouth at bedtime. 06/18/19   [provider]  tiZANidine (ZANAFLEX) 4 MG tablet Take 4 mg by mouth 2 (two) times daily as needed for muscle spasms. 06/18/19   [provider]  VENTOLIN HFA 108 (90 Base) MCG/ACT inhaler Inhale 2 puffs into the lungs every 4 (four) hours as needed for shortness of breath. 11/11/21   [provider]     Vital Signs: BP (!) 122/47   Pulse 61   Temp 98.4 F (36.9 C) (Oral)   Resp 16   Ht 5' (1.524 m)   Wt 245 lb (111.1 kg)   SpO2 95%   BMI 47.85 kg/m   Physical Exam awake/alert; chest- CTA bilat; heart- RRR; abd- obese, soft,+BS,NT; trace pretibial edema bilat  Imaging: No results found.  Labs:  CBC: Recent Labs    03/05/21 1823 07/14/21 1030 09/17/21 1144 11/24/21 1447  WBC 2.6* 1.8* 2.0 Repeated and verified X2.*  1.7*   HGB 11.9* 11.2* 10.4* 10.0*  HCT 36.1 33.9* 31.0* 29.2*  PLT 119* 118* 113.0* 128*    COAGS: No results for input(s): "INR", "APTT" in the last 8760 hours.  BMP: Recent Labs    01/08/21 1305 03/05/21 1823 07/14/21 1030 09/17/21 1144 11/24/21 1447  NA 136 136 140 137 138  K 4.7 4.5 4.6 4.7 4.5  CL 103 102 102 104 106  CO2 '23 24 28 26 26  '$ GLUCOSE 150* 257* 283* 183* 186*  BUN 30* 32* 25* 29* 22  CALCIUM 9.5 9.1 9.5 9.2 9.6  CREATININE 1.04* 1.16* 1.19* 1.33* 1.07*  GFRNONAA 59* 51* 49*  --  56*    LIVER FUNCTION TESTS: Recent Labs    01/08/21 1305 07/14/21 1030 09/17/21 1144 11/24/21 1447  BILITOT 0.6 0.5 0.4 0.4  AST '29 23 18 17  '$ ALT '22 22 14 13  '$ ALKPHOS 84 104 78 80  PROT 7.5 7.0 6.8 7.1  ALBUMIN 4.3 4.0 4.3 4.1    Assessment and Plan: Pt known to IR service from PICC placement in 2013, attempted right nephrostomy placement in 2015, L1-2 disc space aspiration 2020, left psoas muscle aspiration in 2020. She has a PMH sig for anemia, obesity, arthritis, Bell's palsy, depression, DM, HLD, neutropenia, fibromyalgia, Graves dz, nephrolithiasis, HTN, OSA, RLS, prior MRSA lumbar infection. She presents now with marked hepatomegaly/? early cirrhosis, splenomegaly by imaging, thrombocytopenia and liver elastography revealing:   ULTRASOUND LIVER:   Coarsened echogenic hepatic parenchyma, can be seen with fatty infiltration and cirrhosis.   Due to limitations of exam, unable to establish patency and direction of blood flow of the portal vein Parent   ULTRASOUND HEPATIC ELASTOGRAPHY:   Median kPa:  5.7   Diagnostic category: < or = 9 kPa: in the absence of other known clinical signs, rules out cACLD  She is scheduled today for US guided random core liver biopsy for further evaluation.Risks and benefits of procedure was discussed with the patient including, but not limited to bleeding, infection, damage to adjacent structures or low yield requiring additional  tests.  All of the questions were answered and there is agreement to proceed.  Consent signed and in chart.  LABS PENDING  Electronically Signed: D. Rowe Robert, PA-C 11/25/2021, 11:48 AM   I spent a total of 25 Minutes at the the patient's bedside AND on the patient's hospital floor or unit, greater than 50% of which was counseling/coordinating care for image guided random liver biopsy

## 2021-11-25 NOTE — Procedures (Signed)
Interventional Radiology Procedure Note  Procedure: US guided medical liver biopsy, left Complications: None EBL: None Recommendations: - Bedrest 2 hours.   - Routine wound care - Follow up pathology - Advance diet   Signed,  Corrie Mckusick, DO

## 2021-11-25 NOTE — Progress Notes (Signed)
Pt up to [facility] via w/c-NAD

## 2021-11-26 ENCOUNTER — Encounter: Payer: Self-pay | Admitting: Physician Assistant

## 2021-11-26 DIAGNOSIS — G4733 Obstructive sleep apnea (adult) (pediatric): Secondary | ICD-10-CM | POA: Diagnosis not present

## 2021-11-26 DIAGNOSIS — R0600 Dyspnea, unspecified: Secondary | ICD-10-CM | POA: Diagnosis not present

## 2021-11-26 DIAGNOSIS — D649 Anemia, unspecified: Secondary | ICD-10-CM | POA: Diagnosis not present

## 2021-11-26 DIAGNOSIS — M1990 Unspecified osteoarthritis, unspecified site: Secondary | ICD-10-CM | POA: Diagnosis not present

## 2021-11-26 DIAGNOSIS — J45909 Unspecified asthma, uncomplicated: Secondary | ICD-10-CM | POA: Diagnosis not present
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
## 2021-11-27 ENCOUNTER — Telehealth: Payer: Self-pay | Admitting: Hematology and Oncology

## 2021-11-27 NOTE — Telephone Encounter (Signed)
Per 9/6 los called and spoke to pt about appointment pt confirmed appointment

## 2021-11-30 ENCOUNTER — Inpatient Hospital Stay: Payer: Medicare Other

## 2021-11-30 ENCOUNTER — Telehealth: Payer: Self-pay | Admitting: Physician Assistant

## 2021-11-30 ENCOUNTER — Other Ambulatory Visit: Payer: Self-pay

## 2021-11-30 VITALS — BP 154/50 | HR 62 | Temp 98.6°F | Resp 18

## 2021-11-30 DIAGNOSIS — Z8551 Personal history of malignant neoplasm of bladder: Secondary | ICD-10-CM | POA: Diagnosis not present

## 2021-11-30 DIAGNOSIS — D508 Other iron deficiency anemias: Secondary | ICD-10-CM

## 2021-11-30 DIAGNOSIS — D696 Thrombocytopenia, unspecified: Secondary | ICD-10-CM | POA: Diagnosis not present

## 2021-11-30 DIAGNOSIS — D72819 Decreased white blood cell count, unspecified: Secondary | ICD-10-CM | POA: Diagnosis not present

## 2021-11-30 DIAGNOSIS — Z9884 Bariatric surgery status: Secondary | ICD-10-CM | POA: Diagnosis not present

## 2021-11-30 DIAGNOSIS — D509 Iron deficiency anemia, unspecified: Secondary | ICD-10-CM | POA: Diagnosis not present

## 2021-11-30 LAB — SURGICAL PATHOLOGY

## 2021-11-30 MED ORDER — SODIUM CHLORIDE 0.9 % IV SOLN: Freq: Once | INTRAVENOUS | Status: AC

## 2021-11-30 MED ORDER — SODIUM CHLORIDE 0.9 % IV SOLN
200.0000 mg | Freq: Once | INTRAVENOUS | Status: AC
  Administered 2021-11-30: 200 mg via INTRAVENOUS
  Filled 2021-11-30: qty 200

## 2021-11-30 NOTE — Progress Notes (Signed)
Pt observed for 30 minutes post Venofer infusion. Pt tolerated trtmt well w/out incident. VSS at discharge.  W/C assist by NT to lobby.

## 2021-11-30 NOTE — Telephone Encounter (Signed)
Patient called, states she would like to talk to a nurse regarding her liver biopsy. Please call to advise. Thank you

## 2021-11-30 NOTE — Patient Instructions (Signed)

## 2021-12-02 DIAGNOSIS — M25562 Pain in left knee: Secondary | ICD-10-CM | POA: Diagnosis not present

## 2021-12-02 DIAGNOSIS — M1712 Unilateral primary osteoarthritis, left knee: Secondary | ICD-10-CM | POA: Diagnosis not present

## 2021-12-07 ENCOUNTER — Inpatient Hospital Stay: Payer: Medicare Other

## 2021-12-07 ENCOUNTER — Other Ambulatory Visit: Payer: Self-pay

## 2021-12-07 VITALS — BP 140/49 | HR 65 | Temp 98.0°F | Resp 17

## 2021-12-07 DIAGNOSIS — D508 Other iron deficiency anemias: Secondary | ICD-10-CM

## 2021-12-07 DIAGNOSIS — D696 Thrombocytopenia, unspecified: Secondary | ICD-10-CM | POA: Diagnosis not present

## 2021-12-07 DIAGNOSIS — Z8551 Personal history of malignant neoplasm of bladder: Secondary | ICD-10-CM | POA: Diagnosis not present

## 2021-12-07 DIAGNOSIS — D72819 Decreased white blood cell count, unspecified: Secondary | ICD-10-CM | POA: Diagnosis not present

## 2021-12-07 DIAGNOSIS — Z9884 Bariatric surgery status: Secondary | ICD-10-CM | POA: Diagnosis not present

## 2021-12-07 DIAGNOSIS — D509 Iron deficiency anemia, unspecified: Secondary | ICD-10-CM | POA: Diagnosis not present

## 2021-12-07 MED ORDER — SODIUM CHLORIDE 0.9 % IV SOLN
200.0000 mg | Freq: Once | INTRAVENOUS | Status: AC
  Administered 2021-12-07: 200 mg via INTRAVENOUS
  Filled 2021-12-07: qty 200

## 2021-12-07 MED ORDER — SODIUM CHLORIDE 0.9 % IV SOLN: Freq: Once | INTRAVENOUS | Status: AC

## 2021-12-07 NOTE — Patient Instructions (Signed)

## 2021-12-07 NOTE — Progress Notes (Signed)
Pt declined to stay for post 30 obs, observed for 10 min, VSS, wheelchair to lobby upon discharge.

## 2021-12-09 DIAGNOSIS — M1712 Unilateral primary osteoarthritis, left knee: Secondary | ICD-10-CM | POA: Diagnosis not present

## 2021-12-09 DIAGNOSIS — M25562 Pain in left knee: Secondary | ICD-10-CM | POA: Diagnosis not present

## 2021-12-14 ENCOUNTER — Inpatient Hospital Stay: Payer: Medicare Other

## 2021-12-17 DIAGNOSIS — M25562 Pain in left knee: Secondary | ICD-10-CM | POA: Diagnosis not present

## 2021-12-17 DIAGNOSIS — E785 Hyperlipidemia, unspecified: Secondary | ICD-10-CM | POA: Diagnosis not present

## 2021-12-17 DIAGNOSIS — K7581 Nonalcoholic steatohepatitis (NASH): Secondary | ICD-10-CM | POA: Diagnosis not present

## 2021-12-17 DIAGNOSIS — M1712 Unilateral primary osteoarthritis, left knee: Secondary | ICD-10-CM | POA: Diagnosis not present

## 2021-12-17 DIAGNOSIS — I129 Hypertensive chronic kidney disease with stage 1 through stage 4 chronic kidney disease, or unspecified chronic kidney disease: Secondary | ICD-10-CM | POA: Diagnosis not present

## 2021-12-17 DIAGNOSIS — E1129 Type 2 diabetes mellitus with other diabetic kidney complication: Secondary | ICD-10-CM | POA: Diagnosis not present

## 2021-12-17 DIAGNOSIS — Z794 Long term (current) use of insulin: Secondary | ICD-10-CM | POA: Diagnosis not present

## 2021-12-17 DIAGNOSIS — N1831 Chronic kidney disease, stage 3a: Secondary | ICD-10-CM | POA: Diagnosis not present

## 2021-12-18 ENCOUNTER — Ambulatory Visit: Payer: Medicare Other

## 2021-12-18 ENCOUNTER — Inpatient Hospital Stay: Payer: Medicare Other

## 2021-12-18 ENCOUNTER — Other Ambulatory Visit: Payer: Self-pay

## 2021-12-18 VITALS — BP 152/53 | HR 63 | Temp 98.1°F | Resp 16

## 2021-12-18 DIAGNOSIS — D696 Thrombocytopenia, unspecified: Secondary | ICD-10-CM | POA: Diagnosis not present

## 2021-12-18 DIAGNOSIS — D509 Iron deficiency anemia, unspecified: Secondary | ICD-10-CM | POA: Diagnosis not present

## 2021-12-18 DIAGNOSIS — Z9884 Bariatric surgery status: Secondary | ICD-10-CM | POA: Diagnosis not present

## 2021-12-18 DIAGNOSIS — D508 Other iron deficiency anemias: Secondary | ICD-10-CM

## 2021-12-18 DIAGNOSIS — D72819 Decreased white blood cell count, unspecified: Secondary | ICD-10-CM | POA: Diagnosis not present

## 2021-12-18 DIAGNOSIS — Z8551 Personal history of malignant neoplasm of bladder: Secondary | ICD-10-CM | POA: Diagnosis not present

## 2021-12-18 MED ORDER — SODIUM CHLORIDE 0.9 % IV SOLN
200.0000 mg | Freq: Once | INTRAVENOUS | Status: AC
  Administered 2021-12-18: 200 mg via INTRAVENOUS
  Filled 2021-12-18: qty 200

## 2021-12-18 MED ORDER — SODIUM CHLORIDE 0.9 % IV SOLN: Freq: Once | INTRAVENOUS | Status: AC

## 2021-12-18 NOTE — Progress Notes (Signed)
Pt declined to be observed for 30 minutes post Venofer infusion. Pt tolerated trtmt well w/out incident. VSS at discharge.  W/C assist to lobby by RN in stable condition with no complaints.

## 2021-12-18 NOTE — Patient Instructions (Signed)

## 2021-12-24 ENCOUNTER — Encounter: Payer: Self-pay | Admitting: Physical Medicine & Rehabilitation

## 2022-01-04 DIAGNOSIS — M1712 Unilateral primary osteoarthritis, left knee: Secondary | ICD-10-CM | POA: Diagnosis not present

## 2022-01-13 ENCOUNTER — Ambulatory Visit: Payer: Medicare Other | Admitting: Hematology and Oncology

## 2022-01-13 ENCOUNTER — Other Ambulatory Visit: Payer: Medicare Other

## 2022-02-02 ENCOUNTER — Encounter: Payer: Self-pay | Admitting: Physical Medicine & Rehabilitation

## 2022-02-02 ENCOUNTER — Ambulatory Visit: Payer: Medicare Other | Admitting: Hematology and Oncology

## 2022-02-02 ENCOUNTER — Encounter: Payer: Medicare Other | Attending: Physical Medicine & Rehabilitation | Admitting: Physical Medicine & Rehabilitation

## 2022-02-02 ENCOUNTER — Other Ambulatory Visit: Payer: Medicare Other

## 2022-02-02 VITALS — BP 115/72 | HR 73 | Ht 60.0 in | Wt 230.4 lb

## 2022-02-02 DIAGNOSIS — G8929 Other chronic pain: Secondary | ICD-10-CM | POA: Insufficient documentation

## 2022-02-02 DIAGNOSIS — M25562 Pain in left knee: Secondary | ICD-10-CM | POA: Diagnosis not present
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
## 2022-02-09 DIAGNOSIS — R0989 Other specified symptoms and signs involving the circulatory and respiratory systems: Secondary | ICD-10-CM | POA: Diagnosis not present

## 2022-02-09 DIAGNOSIS — R051 Acute cough: Secondary | ICD-10-CM | POA: Diagnosis not present

## 2022-02-14 ENCOUNTER — Other Ambulatory Visit: Payer: Self-pay | Admitting: Hematology and Oncology

## 2022-02-14 DIAGNOSIS — D61818 Other pancytopenia: Secondary | ICD-10-CM

## 2022-02-14 DIAGNOSIS — D500 Iron deficiency anemia secondary to blood loss (chronic): Secondary | ICD-10-CM

## 2022-02-15 ENCOUNTER — Inpatient Hospital Stay (HOSPITAL_BASED_OUTPATIENT_CLINIC_OR_DEPARTMENT_OTHER): Payer: Medicare Other | Admitting: Hematology and Oncology

## 2022-02-15 ENCOUNTER — Inpatient Hospital Stay: Payer: Medicare Other | Attending: Physician Assistant

## 2022-02-15 ENCOUNTER — Other Ambulatory Visit: Payer: Self-pay

## 2022-02-15 VITALS — BP 132/51 | HR 73 | Temp 98.3°F | Resp 15 | Wt 225.4 lb

## 2022-02-15 DIAGNOSIS — D709 Neutropenia, unspecified: Secondary | ICD-10-CM | POA: Insufficient documentation

## 2022-02-15 DIAGNOSIS — D500 Iron deficiency anemia secondary to blood loss (chronic): Secondary | ICD-10-CM | POA: Diagnosis not present

## 2022-02-15 DIAGNOSIS — R161 Splenomegaly, not elsewhere classified: Secondary | ICD-10-CM | POA: Diagnosis not present

## 2022-02-15 DIAGNOSIS — D509 Iron deficiency anemia, unspecified: Secondary | ICD-10-CM | POA: Insufficient documentation

## 2022-02-15 DIAGNOSIS — D696 Thrombocytopenia, unspecified: Secondary | ICD-10-CM | POA: Insufficient documentation

## 2022-02-15 DIAGNOSIS — D61818 Other pancytopenia: Secondary | ICD-10-CM | POA: Diagnosis not present

## 2022-02-15 LAB — CBC WITH DIFFERENTIAL (CANCER CENTER ONLY)
Abs Immature Granulocytes: 0.02 10*3/uL (ref 0.00–0.07)
Basophils Absolute: 0 10*3/uL (ref 0.0–0.1)
Basophils Relative: 0 %
Eosinophils Absolute: 0 10*3/uL (ref 0.0–0.5)
Eosinophils Relative: 0 %
HCT: 32 % — ABNORMAL LOW (ref 36.0–46.0)
Hemoglobin: 10.7 g/dL — ABNORMAL LOW (ref 12.0–15.0)
Immature Granulocytes: 1 %
Lymphocytes Relative: 13 %
Lymphs Abs: 0.4 10*3/uL — ABNORMAL LOW (ref 0.7–4.0)
MCH: 28.1 pg (ref 26.0–34.0)
MCHC: 33.4 g/dL (ref 30.0–36.0)
MCV: 84 fL (ref 80.0–100.0)
Monocytes Absolute: 0.1 10*3/uL (ref 0.1–1.0)
Monocytes Relative: 3 %
Neutro Abs: 2.7 10*3/uL (ref 1.7–7.7)
Neutrophils Relative %: 83 %
Platelet Count: 122 10*3/uL — ABNORMAL LOW (ref 150–400)
RBC: 3.81 MIL/uL — ABNORMAL LOW (ref 3.87–5.11)
RDW: 14.6 % (ref 11.5–15.5)
WBC Count: 3.3 10*3/uL — ABNORMAL LOW (ref 4.0–10.5)
nRBC: 0 % (ref 0.0–0.2)

## 2022-02-15 LAB — IRON AND IRON BINDING CAPACITY (CC-WL,HP ONLY)
Iron: 20 ug/dL — ABNORMAL LOW (ref 28–170)
Saturation Ratios: 7 % — ABNORMAL LOW (ref 10.4–31.8)
TIBC: 280 ug/dL (ref 250–450)
UIBC: 260 ug/dL (ref 148–442)

## 2022-02-15 LAB — CMP (CANCER CENTER ONLY)
ALT: 12 U/L (ref 0–44)
AST: 19 U/L (ref 15–41)
Albumin: 3.7 g/dL (ref 3.5–5.0)
Alkaline Phosphatase: 75 U/L (ref 38–126)
Anion gap: 8 (ref 5–15)
BUN: 38 mg/dL — ABNORMAL HIGH (ref 8–23)
CO2: 27 mmol/L (ref 22–32)
Calcium: 9.1 mg/dL (ref 8.9–10.3)
Chloride: 107 mmol/L (ref 98–111)
Creatinine: 1.42 mg/dL — ABNORMAL HIGH (ref 0.44–1.00)
GFR, Estimated: 40 mL/min — ABNORMAL LOW (ref >60)
Glucose, Bld: 136 mg/dL — ABNORMAL HIGH (ref 70–99)
Potassium: 3.4 mmol/L — ABNORMAL LOW (ref 3.5–5.1)
Sodium: 142 mmol/L (ref 135–145)
Total Bilirubin: 0.7 mg/dL (ref 0.3–1.2)
Total Protein: 6.6 g/dL (ref 6.5–8.1)

## 2022-02-15 LAB — RETIC PANEL
Immature Retic Fract: 13.4 % (ref 2.3–15.9)
RBC.: 3.74 MIL/uL — ABNORMAL LOW (ref 3.87–5.11)
Retic Count, Absolute: 68.4 10*3/uL (ref 19.0–186.0)
Retic Ct Pct: 1.8 % (ref 0.4–3.1)
Reticulocyte Hemoglobin: 29.4 pg (ref >27.9)

## 2022-02-15 LAB — VITAMIN B12: Vitamin B-12: 481 pg/mL (ref 180–914)
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
## 2022-02-16 ENCOUNTER — Telehealth: Payer: Self-pay | Admitting: Hematology and Oncology

## 2022-02-16 DIAGNOSIS — J189 Pneumonia, unspecified organism: Secondary | ICD-10-CM | POA: Diagnosis not present

## 2022-02-16 DIAGNOSIS — R051 Acute cough: Secondary | ICD-10-CM | POA: Diagnosis not present

## 2022-02-16 DIAGNOSIS — N1831 Chronic kidney disease, stage 3a: Secondary | ICD-10-CM | POA: Diagnosis not present

## 2022-02-16 DIAGNOSIS — I129 Hypertensive chronic kidney disease with stage 1 through stage 4 chronic kidney disease, or unspecified chronic kidney disease: Secondary | ICD-10-CM | POA: Diagnosis not present

## 2022-02-16 DIAGNOSIS — J45909 Unspecified asthma, uncomplicated: Secondary | ICD-10-CM | POA: Diagnosis not present

## 2022-02-16 LAB — FERRITIN: Ferritin: 135 ng/mL (ref 11–307)

## 2022-02-16 NOTE — Telephone Encounter (Signed)
Per 11/27 los called and spoke to pt about appointment

## 2022-02-17 LAB — METHYLMALONIC ACID, SERUM: Methylmalonic Acid, Quantitative: 216 nmol/L (ref 0–378)

## 2022-02-21 ENCOUNTER — Encounter (HOSPITAL_COMMUNITY): Payer: Self-pay | Admitting: *Deleted

## 2022-02-21 ENCOUNTER — Other Ambulatory Visit: Payer: Self-pay

## 2022-02-21 ENCOUNTER — Emergency Department (HOSPITAL_COMMUNITY): Payer: Medicare Other

## 2022-02-21 ENCOUNTER — Inpatient Hospital Stay (HOSPITAL_COMMUNITY)
Admission: EM | Admit: 2022-02-21 | Discharge: 2022-02-27 | DRG: 193 | Disposition: A | Discharge status: Home Health | Payer: Medicare Other | Attending: Internal Medicine | Admitting: Internal Medicine

## 2022-02-21 ENCOUNTER — Encounter: Payer: Self-pay | Admitting: Physician Assistant

## 2022-02-21 DIAGNOSIS — D72819 Decreased white blood cell count, unspecified: Secondary | ICD-10-CM | POA: Diagnosis present

## 2022-02-21 DIAGNOSIS — Z833 Family history of diabetes mellitus: Secondary | ICD-10-CM

## 2022-02-21 DIAGNOSIS — J189 Pneumonia, unspecified organism: Principal | ICD-10-CM | POA: Diagnosis present

## 2022-02-21 DIAGNOSIS — K7460 Unspecified cirrhosis of liver: Secondary | ICD-10-CM | POA: Diagnosis present

## 2022-02-21 DIAGNOSIS — E1142 Type 2 diabetes mellitus with diabetic polyneuropathy: Secondary | ICD-10-CM | POA: Diagnosis present

## 2022-02-21 DIAGNOSIS — I5033 Acute on chronic diastolic (congestive) heart failure: Secondary | ICD-10-CM | POA: Diagnosis present

## 2022-02-21 DIAGNOSIS — Z882 Allergy status to sulfonamides status: Secondary | ICD-10-CM

## 2022-02-21 DIAGNOSIS — Z794 Long term (current) use of insulin: Secondary | ICD-10-CM

## 2022-02-21 DIAGNOSIS — J45909 Unspecified asthma, uncomplicated: Secondary | ICD-10-CM | POA: Diagnosis present

## 2022-02-21 DIAGNOSIS — I130 Hypertensive heart and chronic kidney disease with heart failure and stage 1 through stage 4 chronic kidney disease, or unspecified chronic kidney disease: Secondary | ICD-10-CM | POA: Diagnosis present

## 2022-02-21 DIAGNOSIS — J984 Other disorders of lung: Secondary | ICD-10-CM | POA: Diagnosis not present

## 2022-02-21 DIAGNOSIS — E1122 Type 2 diabetes mellitus with diabetic chronic kidney disease: Secondary | ICD-10-CM | POA: Diagnosis present

## 2022-02-21 DIAGNOSIS — D61818 Other pancytopenia: Secondary | ICD-10-CM | POA: Diagnosis present

## 2022-02-21 DIAGNOSIS — G510 Bell's palsy: Secondary | ICD-10-CM | POA: Diagnosis present

## 2022-02-21 DIAGNOSIS — E119 Type 2 diabetes mellitus without complications: Secondary | ICD-10-CM

## 2022-02-21 DIAGNOSIS — Z886 Allergy status to analgesic agent status: Secondary | ICD-10-CM

## 2022-02-21 DIAGNOSIS — E11649 Type 2 diabetes mellitus with hypoglycemia without coma: Secondary | ICD-10-CM | POA: Diagnosis present

## 2022-02-21 DIAGNOSIS — N1832 Chronic kidney disease, stage 3b: Secondary | ICD-10-CM | POA: Diagnosis present

## 2022-02-21 DIAGNOSIS — Z885 Allergy status to narcotic agent status: Secondary | ICD-10-CM

## 2022-02-21 DIAGNOSIS — Z8249 Family history of ischemic heart disease and other diseases of the circulatory system: Secondary | ICD-10-CM

## 2022-02-21 DIAGNOSIS — T380X5A Adverse effect of glucocorticoids and synthetic analogues, initial encounter: Secondary | ICD-10-CM | POA: Diagnosis present

## 2022-02-21 DIAGNOSIS — F32A Depression, unspecified: Secondary | ICD-10-CM | POA: Diagnosis present

## 2022-02-21 DIAGNOSIS — E039 Hypothyroidism, unspecified: Secondary | ICD-10-CM | POA: Diagnosis present

## 2022-02-21 DIAGNOSIS — G2581 Restless legs syndrome: Secondary | ICD-10-CM | POA: Diagnosis present

## 2022-02-21 DIAGNOSIS — Z1152 Encounter for screening for COVID-19: Secondary | ICD-10-CM

## 2022-02-21 DIAGNOSIS — E7800 Pure hypercholesterolemia, unspecified: Secondary | ICD-10-CM | POA: Diagnosis present

## 2022-02-21 DIAGNOSIS — F419 Anxiety disorder, unspecified: Secondary | ICD-10-CM | POA: Diagnosis present

## 2022-02-21 DIAGNOSIS — D509 Iron deficiency anemia, unspecified: Secondary | ICD-10-CM | POA: Diagnosis present

## 2022-02-21 DIAGNOSIS — F418 Other specified anxiety disorders: Secondary | ICD-10-CM | POA: Diagnosis present

## 2022-02-21 DIAGNOSIS — M797 Fibromyalgia: Secondary | ICD-10-CM | POA: Diagnosis present

## 2022-02-21 DIAGNOSIS — J9601 Acute respiratory failure with hypoxia: Secondary | ICD-10-CM | POA: Diagnosis not present

## 2022-02-21 DIAGNOSIS — E0590 Thyrotoxicosis, unspecified without thyrotoxic crisis or storm: Secondary | ICD-10-CM | POA: Diagnosis present

## 2022-02-21 DIAGNOSIS — Z9842 Cataract extraction status, left eye: Secondary | ICD-10-CM

## 2022-02-21 DIAGNOSIS — J90 Pleural effusion, not elsewhere classified: Secondary | ICD-10-CM | POA: Diagnosis not present

## 2022-02-21 DIAGNOSIS — Z6841 Body Mass Index (BMI) 40.0 and over, adult: Secondary | ICD-10-CM

## 2022-02-21 DIAGNOSIS — Z79899 Other long term (current) drug therapy: Secondary | ICD-10-CM

## 2022-02-21 DIAGNOSIS — R0902 Hypoxemia: Secondary | ICD-10-CM | POA: Diagnosis not present

## 2022-02-21 DIAGNOSIS — E1165 Type 2 diabetes mellitus with hyperglycemia: Secondary | ICD-10-CM | POA: Diagnosis present

## 2022-02-21 DIAGNOSIS — R0600 Dyspnea, unspecified: Secondary | ICD-10-CM | POA: Diagnosis not present

## 2022-02-21 DIAGNOSIS — Z7951 Long term (current) use of inhaled steroids: Secondary | ICD-10-CM

## 2022-02-21 DIAGNOSIS — T501X5A Adverse effect of loop [high-ceiling] diuretics, initial encounter: Secondary | ICD-10-CM | POA: Diagnosis present

## 2022-02-21 DIAGNOSIS — Z9841 Cataract extraction status, right eye: Secondary | ICD-10-CM

## 2022-02-21 DIAGNOSIS — Z961 Presence of intraocular lens: Secondary | ICD-10-CM | POA: Diagnosis present

## 2022-02-21 DIAGNOSIS — N1831 Chronic kidney disease, stage 3a: Secondary | ICD-10-CM | POA: Diagnosis present

## 2022-02-21 DIAGNOSIS — Z888 Allergy status to other drugs, medicaments and biological substances status: Secondary | ICD-10-CM

## 2022-02-21 DIAGNOSIS — Z823 Family history of stroke: Secondary | ICD-10-CM

## 2022-02-21 DIAGNOSIS — D696 Thrombocytopenia, unspecified: Secondary | ICD-10-CM | POA: Diagnosis present

## 2022-02-21 DIAGNOSIS — G4733 Obstructive sleep apnea (adult) (pediatric): Secondary | ICD-10-CM | POA: Diagnosis present

## 2022-02-21 DIAGNOSIS — I428 Other cardiomyopathies: Secondary | ICD-10-CM | POA: Diagnosis not present

## 2022-02-21 DIAGNOSIS — R0602 Shortness of breath: Secondary | ICD-10-CM | POA: Diagnosis not present

## 2022-02-21 DIAGNOSIS — Z96611 Presence of right artificial shoulder joint: Secondary | ICD-10-CM | POA: Diagnosis present

## 2022-02-21 LAB — COMPREHENSIVE METABOLIC PANEL
ALT: 12 U/L (ref 0–44)
AST: 18 U/L (ref 15–41)
Albumin: 2.5 g/dL — ABNORMAL LOW (ref 3.5–5.0)
Alkaline Phosphatase: 76 U/L (ref 38–126)
Anion gap: 11 (ref 5–15)
BUN: 22 mg/dL (ref 8–23)
CO2: 26 mmol/L (ref 22–32)
Calcium: 9.1 mg/dL (ref 8.9–10.3)
Chloride: 105 mmol/L (ref 98–111)
Creatinine, Ser: 1.39 mg/dL — ABNORMAL HIGH (ref 0.44–1.00)
GFR, Estimated: 41 mL/min — ABNORMAL LOW (ref >60)
Glucose, Bld: 89 mg/dL (ref 70–99)
Potassium: 3.4 mmol/L — ABNORMAL LOW (ref 3.5–5.1)
Sodium: 142 mmol/L (ref 135–145)
Total Bilirubin: 0.5 mg/dL (ref 0.3–1.2)
Total Protein: 6.4 g/dL — ABNORMAL LOW (ref 6.5–8.1)

## 2022-02-21 LAB — BRAIN NATRIURETIC PEPTIDE: B Natriuretic Peptide: 530.2 pg/mL — ABNORMAL HIGH (ref 0.0–100.0)

## 2022-02-21 LAB — CBC WITH DIFFERENTIAL/PLATELET
Abs Immature Granulocytes: 0.03 10*3/uL (ref 0.00–0.07)
Basophils Absolute: 0 10*3/uL (ref 0.0–0.1)
Basophils Relative: 0 %
Eosinophils Absolute: 0 10*3/uL (ref 0.0–0.5)
Eosinophils Relative: 0 %
HCT: 34 % — ABNORMAL LOW (ref 36.0–46.0)
Hemoglobin: 10.6 g/dL — ABNORMAL LOW (ref 12.0–15.0)
Immature Granulocytes: 1 %
Lymphocytes Relative: 8 %
Lymphs Abs: 0.3 10*3/uL — ABNORMAL LOW (ref 0.7–4.0)
MCH: 27.3 pg (ref 26.0–34.0)
MCHC: 31.2 g/dL (ref 30.0–36.0)
MCV: 87.6 fL (ref 80.0–100.0)
Monocytes Absolute: 0.2 10*3/uL (ref 0.1–1.0)
Monocytes Relative: 4 %
Neutro Abs: 3.4 10*3/uL (ref 1.7–7.7)
Neutrophils Relative %: 87 %
Platelets: 183 10*3/uL (ref 150–400)
RBC: 3.88 MIL/uL (ref 3.87–5.11)
RDW: 14.6 % (ref 11.5–15.5)
WBC: 3.9 10*3/uL — ABNORMAL LOW (ref 4.0–10.5)
nRBC: 0 % (ref 0.0–0.2)

## 2022-02-21 LAB — RESP PANEL BY RT-PCR (FLU A&B, COVID) ARPGX2
Influenza A by PCR: NEGATIVE
Influenza B by PCR: NEGATIVE
SARS Coronavirus 2 by RT PCR: NEGATIVE

## 2022-02-21 LAB — LACTIC ACID, PLASMA: Lactic Acid, Venous: 1.1 mmol/L (ref 0.5–1.9)

## 2022-02-21 LAB — TROPONIN I (HIGH SENSITIVITY): Troponin I (High Sensitivity): 17 ng/L (ref <18)

## 2022-02-21 MED ORDER — VANCOMYCIN HCL 1750 MG/350ML IV SOLN
1750.0000 mg | Freq: Once | INTRAVENOUS | Status: AC
  Administered 2022-02-21: 1750 mg via INTRAVENOUS
  Filled 2022-02-21: qty 350

## 2022-02-21 MED ORDER — SODIUM CHLORIDE 0.9 % IV SOLN
2.0000 g | Freq: Once | INTRAVENOUS | Status: AC
  Administered 2022-02-21: 2 g via INTRAVENOUS
  Filled 2022-02-21: qty 12.5

## 2022-02-21 MED ORDER — SODIUM CHLORIDE 0.9 % IV BOLUS
500.0000 mL | Freq: Once | INTRAVENOUS | Status: AC
  Administered 2022-02-21: 500 mL via INTRAVENOUS

## 2022-02-21 MED ORDER — ONDANSETRON HCL 4 MG/2ML IJ SOLN
4.0000 mg | Freq: Once | INTRAMUSCULAR | Status: AC
  Administered 2022-02-21: 4 mg via INTRAVENOUS
  Filled 2022-02-21: qty 2

## 2022-02-21 MED ORDER — SODIUM CHLORIDE 0.9 % IV SOLN
2.0000 g | Freq: Two times a day (BID) | INTRAVENOUS | Status: AC
  Administered 2022-02-22 – 2022-02-26 (×10): 2 g via INTRAVENOUS
  Filled 2022-02-21 (×10): qty 12.5

## 2022-02-21 MED ORDER — ACETAMINOPHEN 500 MG PO TABS
1000.0000 mg | ORAL_TABLET | Freq: Once | ORAL | Status: AC
  Administered 2022-02-21: 1000 mg via ORAL
  Filled 2022-02-21: qty 2

## 2022-02-21 MED ORDER — VANCOMYCIN HCL 1500 MG/300ML IV SOLN: 1500.0000 mg | INTRAVENOUS | Status: DC

## 2022-02-21 NOTE — ED Provider Triage Note (Cosign Needed Addendum)
Emergency Medicine Provider Triage Evaluation Note  [name] , a 71 y.o. female  was evaluated in triage.  Pt complains of shortness of breath.  Patient states that her symptoms started 3 to 4 days ago.  She reports significant shortness of breath with minimal exertion.  She was seen by her PCP yesterday and started on doxycycline and cefdinir after diagnosis of pneumonia.  Her oxygen level has been running low, but very low today.  No chest pain.  No leg swelling.  States that she was tested for flu and COVID and this was negative.  Review of Systems  Positive: Shortness of breath Negative: Fever  Physical Exam  BP 125/81 (BP Location: Left Arm)   Pulse 84   Temp 99.4 F (37.4 C)   Resp (!) 26   Ht 5' (1.524 m)   Wt 102.2 kg   SpO2 91%   BMI 44.00 kg/m  Gen:   Awake, no distress   Resp:  Normal effort, mild tachypnea, decreased lung sounds bilaterally MSK:   Moves extremities without difficulty  Other:  No lower extremity edema  Medical Decision Making  Medically screening exam initiated at 5:04 PM.  Appropriate orders placed.  [name] was informed that the remainder of the evaluation will be completed by another provider, this initial triage assessment does not replace that evaluation, and the importance of remaining in the ED until their evaluation is complete.  On room air on arrival, patient's oxygen very low around 60% on room air.  Patient reports shortness of breath but is not in any significant distress and actually appears very well.  She responded quickly to 4 L nasal cannula and increased to 91%.   Tiffany Cater, PA-C 02/21/22 1707   8:09 PM I asked for patient to be brought back to triage for reassessment. Will also place IV for CT chest.  She appears stable.  States that her breathing is a bit better on the oxygen. '4mg'$  IV zofran ordered for nausea.   BP (!) 128/49 (BP Location: Right Arm)   Pulse 79   Temp 99.8 F (37.7 C) (Oral)   Resp 20   Ht 5'  (1.524 m)   Wt 102.2 kg   SpO2 91%   BMI 44.00 kg/m         Tiffany Cater, PA-C 02/21/22 2140

## 2022-02-21 NOTE — ED Notes (Addendum)
EDP at bedside  

## 2022-02-21 NOTE — ED Notes (Signed)
Patient transported to CT 

## 2022-02-21 NOTE — ED Provider Notes (Signed)
[facility] EMERGENCY DEPARTMENT Provider Note   CSN: [id number] Arrival date & time: 02/21/22  1653     History {Add pertinent medical, surgical, social history, OB history to HPI:1} Chief Complaint  Patient presents with   Shortness of Breath    [name] is a 71 y.o. female.  HPI      71yo female with history of iron deficiency anemia, neutropenia/thrombocytopenia, diabetes, hyperlipidemia, hypertension, hypothyroidism, restless leg syndrome, prior methicillin-resistant coagulase-negative staph lumbar infection in May 2020, sleep apnea on CPAP who presents with shortness of breath.    Has been on Doxycycline and cefdinir since Tuesday, despite that is worsening 1 week ago Tuesday had a day of tight chest, coughing and hoarseness/sore throat went to an urgent care, was put on steroids, which did not help. 11/21. Had dyspnea but not like this.  Cough continued to worsen.  Last time coughed up brownish, gray tinged with blood.  Don't cough things up a lot. Doesn't come all the way up.  Today tried to shower but took several hours because needed to stop and sit   Missed son's wedding yesterday   A little bit of chest pain, but it didn't stay, was passing. Nausea.  Not sure if it was abx.   No leg swelling. No orthopnea. No chills.    No vomiting   Has not had any fever, no chills   Past Medical History:  Diagnosis Date   Anemia    Arthritis    Asthma    OCCAS   Bell's palsy    Broken neck (HCC)    C-1   Carbuncle and furuncle of trunk    Chronic dislocation of right shoulder    Depression    Disorder of fascia    HX OF NECROTIC FASCITIS AFTER ABDOMINAL SURGERY FOR HERNIA- REQUIRED 19 SURGERIES AND 2.5 MONTH HOSPITALIZATION AT [facility]   DM (diabetes mellitus) ([place])    Dysrhythmia    HX OF TACHYCARDIA AND BRADYCARDIA - ON GOING FOR YEARS - DOES NOT HAVE TO SEE CARDIOLOGIST   Elevated cholesterol    Fibromyalgia    Fracture MAY 2014   HX  OF FRACTURED NECK C1- CAUSES SEVERE HEADACHES--LIMITED ROM NECK   Frequent infections    ESPECIALLY PRONE TO INFECTIONS AFTER SURGERIES   Graves disease    Heart murmur    DOES NOT CAUSE ANY PROBLEMS   History of kidney stones    Hyperlipidemia    Hypertension    Hypothyroidism    GRAVES DISEASE   Migraine    Nephrolithiasis    STAGE 3    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   Neuropathy    Obesity    OSA (obstructive sleep apnea)    USES CPAP - DOES NOT KNOW SETTING   Pain    LEFT SHOULDER  PAIN -HARD TO LIE ON LEFT SIDE FOR LONG PERIOD;  PAIN IN LOWER BADK - 3 HERNIATED DISCS AND STENOISIS -   PONV (postoperative nausea and vomiting)    THE GAS MAKES ME NAUSEATED   Restless leg syndrome    Sciatica    Tachycardia    Urinary frequency    Urticaria    UTI (urinary tract infection)      Home Medications Prior to Admission medications   Medication Sig Start Date End Date Taking? Authorizing Provider  acetaminophen (TYLENOL) 650 MG CR tablet Take 1,300 mg by mouth every 8 (eight) hours as needed for pain.    [provider]  amLODipine (NORVASC) 5 MG tablet Take 5 mg by mouth at bedtime.    [provider]  BD INSULIN SYRINGE U/F 31G X 5/16" 0.5 ML MISC USE 5 TIMES A DAY LEVEMIR AND NOVOLOG 12/13/18   [provider]  BD PEN NEEDLE MICRO U/F 32G X 6 MM MISC Inject 32 pens into the skin daily. 06/18/19   [provider]  Cholecalciferol (DIALYVITE VITAMIN D 5000) 125 MCG (5000 UT) capsule Take 5,000 Units by mouth daily.    [provider]  clonazePAM (KLONOPIN) 0.5 MG tablet Take 0.5 mg by mouth See admin instructions. Take 0.5 mg at bedtime, may take an additional 0.5 mg tablet twice daily as needed for anxiety 07/01/14   [provider]  cyanocobalamin (VITAMIN B12) 1000 MCG tablet Take 1,000 mcg by mouth 3 (three) times a week.    [provider]  doxazosin (CARDURA) 2 MG tablet Take 2 mg by mouth daily. 12/04/18   [provider]  fenofibrate 54 MG tablet Take 54 mg by mouth daily with supper. 06/20/17   [provider]  FLUoxetine (PROZAC) 20 MG capsule Take 60 mg by mouth at bedtime.  06/28/17   [provider]  FREESTYLE TEST STRIPS test strip USE STRIPS TO TEST BLOOD GLUCOSE BID 10/03/18   [provider]  gabapentin (NEURONTIN) 300 MG capsule Take 1 capsule (300 mg total) by mouth 3 (three) times daily for 30 days. Patient taking differently: Take 300 mg by mouth 3 (three) times daily as needed (pain). 07/31/18 11/24/21  Donne Hazel, MD  hyoscyamine (LEVSIN SL) 0.125 MG SL tablet Place 1 tablet (0.125 mg total) under the tongue every 6 (six) hours as needed. 09/17/21   Levin Erp, PA  insulin aspart (NOVOLOG) 100 UNIT/ML FlexPen Sliding scale dose: CBG < 70: implement hypoglycemia protocol  CBG 70 - 120: 0 units  CBG 121 - 150: 0 units  CBG 151 - 200: 0 units  CBG 201 - 250: 2 units  CBG 251 - 300: 3 units  CBG 301 - 350: 4 units  CBG 351 - 400: 5 units Patient taking differently: Inject 10-20 Units into the skin 2 (two) times daily as needed for high blood sugar. 07/31/18   Donne Hazel, MD  insulin detemir (LEVEMIR) 100 UNIT/ML injection Inject 55 Units into the skin 2 (two) times daily.    [provider]  Lancets Misc. (UNISTIK 3 COMFORT) MISC USE LANCETS TO CHECK BLOOD GLUCOSE BID 11/30/18   [provider]  levocetirizine (XYZAL) 5 MG tablet Take 5 mg by mouth daily as needed for allergies.    [provider]  metoprolol tartrate (LOPRESSOR) 50 MG tablet Take 50 mg by mouth 3 (three) times daily. 12/04/18   [provider]  omeprazole (PRILOSEC) 20 MG capsule Take 20 mg by mouth daily with supper.     [provider]  ondansetron (ZOFRAN) 8 MG tablet Take 8 mg by mouth every 8 (eight) hours as needed for nausea or vomiting.    [provider]  oxybutynin (DITROPAN-XL) 10 MG 24 hr tablet Take 10 mg by mouth  daily. 06/18/19   [provider]  propylthiouracil (PTU) 50 MG tablet Take 50 mg by mouth daily. 12/22/16   [provider]  QUEtiapine (SEROQUEL) 50 MG tablet Take 50 mg by mouth at bedtime. 06/18/19   [provider]  rOPINIRole (REQUIP) 2 MG tablet Take 2 mg by mouth See admin  instructions. Take 2 mg at bedtime, may take a second 2 mg dose as needed for restless legs    [provider]  SYMBICORT 160-4.5 MCG/ACT inhaler Inhale 2 puffs into the lungs 2 (two) times daily. 11/11/21   [provider]  tiZANidine (ZANAFLEX) 4 MG tablet Take 4 mg by mouth 2 (two) times daily as needed for muscle spasms. 06/18/19   [provider]  VENTOLIN HFA 108 (90 Base) MCG/ACT inhaler Inhale 2 puffs into the lungs every 4 (four) hours as needed for shortness of breath. 11/11/21   [provider]      Allergies    Imitrex [sumatriptan], Nsaids, Statins, Sulfonamide derivatives, Methimazole, Niacin, Codeine, Promethazine hcl, and Vicodin [hydrocodone-acetaminophen]    Review of Systems   Review of Systems  Physical Exam Updated Vital Signs BP (!) 128/49 (BP Location: Right Arm)   Pulse 79   Temp 99.8 F (37.7 C) (Oral)   Resp 20   Ht 5' (1.524 m)   Wt 102.2 kg   SpO2 91%   BMI 44.00 kg/m  Physical Exam  ED Results / Procedures / Treatments   Labs (all labs ordered are listed, but only abnormal results are displayed) Labs Reviewed  CBC WITH DIFFERENTIAL/PLATELET - Abnormal; Notable for the following components:      Result Value   WBC 3.9 (*)    Hemoglobin 10.6 (*)    HCT 34.0 (*)    Lymphs Abs 0.3 (*)    All other components within normal limits  COMPREHENSIVE METABOLIC PANEL - Abnormal; Notable for the following components:   Potassium 3.4 (*)    Creatinine, Ser 1.39 (*)    Total Protein 6.4 (*)    Albumin 2.5 (*)    GFR, Estimated 41 (*)    All other components within normal limits  BRAIN NATRIURETIC PEPTIDE - Abnormal; Notable  for the following components:   B Natriuretic Peptide 530.2 (*)    All other components within normal limits  RESP PANEL BY RT-PCR (FLU A&B, COVID) ARPGX2  LACTIC ACID, PLASMA  LACTIC ACID, PLASMA  TROPONIN I (HIGH SENSITIVITY)    EKG None  Radiology DG Chest 2 View  Result Date: 02/21/2022 CLINICAL DATA:  Hypoxia and shortness of breath.  Pneumonia. EXAM: CHEST - 2 VIEW COMPARISON:  Two-view chest x-ray 01/08/2017 FINDINGS: Heart is enlarged. Asymmetric interstitial and airspace opacities are present in the right lung. Less prominent airspace disease in the left is worse superiorly. No definite effusions are present. Right shoulder arthroplasty noted. Progressive advanced degenerative changes are present in the left shoulder. IMPRESSION: 1. Asymmetric interstitial and airspace opacities, right greater than left. This is concerning for multifocal pneumonia. 2. Cardiomegaly without failure. Electronically Signed   By: San Morelle M.D.   On: 02/21/2022 17:48    Procedures Procedures  {Document cardiac monitor, telemetry assessment procedure when appropriate:1}  Medications Ordered in ED Medications  sodium chloride 0.9 % bolus 500 mL (has no administration in time range)  ondansetron (ZOFRAN) injection 4 mg (4 mg Intravenous Given 02/21/22 2023)    ED Course/ Medical Decision Making/ A&P                           Medical Decision Making Risk OTC drugs. Prescription drug management.   ***  {Document critical care time when appropriate:1} {Document review of labs and clinical decision tools ie heart score, Chads2Vasc2 etc:1}  {Document your independent review of radiology images, and  any outside records:1} {Document your discussion with family members, caretakers, and with consultants:1} {Document social determinants of health affecting pt's care:1} {Document your decision making why or why not admission, treatments were needed:1} Final Clinical Impression(s) / ED  Diagnoses Final diagnoses:  None    Rx / DC Orders ED Discharge Orders     None

## 2022-02-21 NOTE — ED Provider Notes (Signed)
  Provider Note MRN:  [id number]  Arrival date & time: 02/22/22    ED Course and Medical Decision Making  Assumed care from Dr. Billy Fischer at shift change.  Likely multifocal pneumonia with hypoxic respiratory failure, awaiting PE study, will need admission.  2am update:  CTA without PE, again consistent with multifocal pna.  Will admit to medicine.  .Critical Care  Performed by: Maudie Flakes, MD Authorized by: Maudie Flakes, MD   Critical care provider statement:    Critical care time (minutes):  35   Critical care was necessary to treat or prevent imminent or life-threatening deterioration of the following conditions:  Respiratory failure   Critical care was time spent personally by me on the following activities:  Development of treatment plan with patient or surrogate, discussions with consultants, evaluation of patient's response to treatment, examination of patient, ordering and review of laboratory studies, ordering and review of radiographic studies, ordering and performing treatments and interventions, pulse oximetry, re-evaluation of patient's condition and review of old charts   Final Clinical Impressions(s) / ED Diagnoses     ICD-10-CM   1. Acute respiratory failure with hypoxia (HCC)  J96.01     2. Multifocal pneumonia  J18.9       ED Discharge Orders     None       Discharge Instructions   None     Barth Kirks. [place]onia Small, [place] [email or web address]    Maudie Flakes, MD 02/22/22 [phone]

## 2022-02-21 NOTE — Progress Notes (Signed)
Pharmacy Antibiotic Note  [name] is a 71 y.o. female admitted on 02/21/2022 presenting with worsening SOB, recent pna, concern for sepsis.  Pharmacy has been consulted for vancomycin and cefepime dosing.  Plan: Vancomycin 1750 mg IV x 1, then 1500 mg IV q 48h (eAUC 541) Cefepime 2g IV every 12 hours Add MRSA PCR Monitor renal function, Cx/PCR to narrow Vancomycin levels as indicated  Height: 5' (152.4 cm) Weight: 102.2 kg (225 lb 5 oz) IBW/kg (Calculated) : 45.5  Temp (24hrs), Avg:99.6 F (37.6 C), Min:99.4 F (37.4 C), Max:99.8 F (37.7 C)  Recent Labs  Lab 02/15/22 1446 02/21/22 1715  WBC 3.3* 3.9*  CREATININE 1.42* 1.39*  LATICACIDVEN  --  1.1    Estimated Creatinine Clearance: 41.1 mL/min (A) (by C-G formula based on SCr of 1.39 mg/dL (H)).    Allergies  Allergen Reactions   Imitrex [Sumatriptan] Other (See Comments)    Vascular spasms   Nsaids Other (See Comments)    PT UNABLE TO TOLERATE NSAID'S DRUGS DUE TO HX OF GASTRIC SLEEVE SURGERY   Statins Other (See Comments)    Leg/muscle pain   Sulfonamide Derivatives Hives    Childhood allergy    Methimazole Hives   Niacin     heart pounding and beating fast.   Codeine Nausea And Vomiting   Promethazine Hcl Other (See Comments)    Restless leg feeling all over body   Vicodin [Hydrocodone-Acetaminophen] Nausea And Vomiting    Bertis Ruddy, PharmD, [place] Pharmacist ED Pharmacist Phone # [phone] 02/21/2022 10:35 PM

## 2022-02-21 NOTE — ED Triage Notes (Signed)
The pt was diagnosed with pneumonia this past Tuesday she was placed on antibiotics  but she reports that she has had more difficulty breathing for the past 2 days

## 2022-02-22 ENCOUNTER — Emergency Department (HOSPITAL_COMMUNITY): Payer: Medicare Other

## 2022-02-22 ENCOUNTER — Inpatient Hospital Stay (HOSPITAL_COMMUNITY): Payer: Medicare Other

## 2022-02-22 ENCOUNTER — Encounter (HOSPITAL_COMMUNITY): Payer: Self-pay | Admitting: Family Medicine

## 2022-02-22 DIAGNOSIS — D509 Iron deficiency anemia, unspecified: Secondary | ICD-10-CM | POA: Diagnosis present

## 2022-02-22 DIAGNOSIS — Z794 Long term (current) use of insulin: Secondary | ICD-10-CM | POA: Diagnosis not present

## 2022-02-22 DIAGNOSIS — I428 Other cardiomyopathies: Secondary | ICD-10-CM | POA: Diagnosis not present

## 2022-02-22 DIAGNOSIS — J9601 Acute respiratory failure with hypoxia: Secondary | ICD-10-CM | POA: Diagnosis present

## 2022-02-22 DIAGNOSIS — Z1152 Encounter for screening for COVID-19: Secondary | ICD-10-CM | POA: Diagnosis not present

## 2022-02-22 DIAGNOSIS — E119 Type 2 diabetes mellitus without complications: Secondary | ICD-10-CM | POA: Diagnosis not present

## 2022-02-22 DIAGNOSIS — F32A Depression, unspecified: Secondary | ICD-10-CM | POA: Diagnosis present

## 2022-02-22 DIAGNOSIS — E11649 Type 2 diabetes mellitus with hypoglycemia without coma: Secondary | ICD-10-CM | POA: Diagnosis present

## 2022-02-22 DIAGNOSIS — I5033 Acute on chronic diastolic (congestive) heart failure: Secondary | ICD-10-CM | POA: Diagnosis present

## 2022-02-22 DIAGNOSIS — D72819 Decreased white blood cell count, unspecified: Secondary | ICD-10-CM | POA: Diagnosis not present

## 2022-02-22 DIAGNOSIS — J189 Pneumonia, unspecified organism: Secondary | ICD-10-CM | POA: Diagnosis present

## 2022-02-22 DIAGNOSIS — E1122 Type 2 diabetes mellitus with diabetic chronic kidney disease: Secondary | ICD-10-CM | POA: Diagnosis present

## 2022-02-22 DIAGNOSIS — J984 Other disorders of lung: Secondary | ICD-10-CM | POA: Diagnosis not present

## 2022-02-22 DIAGNOSIS — N1832 Chronic kidney disease, stage 3b: Secondary | ICD-10-CM

## 2022-02-22 DIAGNOSIS — F418 Other specified anxiety disorders: Secondary | ICD-10-CM

## 2022-02-22 DIAGNOSIS — D61818 Other pancytopenia: Secondary | ICD-10-CM | POA: Diagnosis present

## 2022-02-22 DIAGNOSIS — K7460 Unspecified cirrhosis of liver: Secondary | ICD-10-CM | POA: Diagnosis present

## 2022-02-22 DIAGNOSIS — R0600 Dyspnea, unspecified: Secondary | ICD-10-CM | POA: Diagnosis not present

## 2022-02-22 DIAGNOSIS — E7800 Pure hypercholesterolemia, unspecified: Secondary | ICD-10-CM | POA: Diagnosis present

## 2022-02-22 DIAGNOSIS — N1831 Chronic kidney disease, stage 3a: Secondary | ICD-10-CM | POA: Diagnosis present

## 2022-02-22 DIAGNOSIS — M797 Fibromyalgia: Secondary | ICD-10-CM | POA: Diagnosis present

## 2022-02-22 DIAGNOSIS — E1142 Type 2 diabetes mellitus with diabetic polyneuropathy: Secondary | ICD-10-CM | POA: Diagnosis present

## 2022-02-22 DIAGNOSIS — I130 Hypertensive heart and chronic kidney disease with heart failure and stage 1 through stage 4 chronic kidney disease, or unspecified chronic kidney disease: Secondary | ICD-10-CM | POA: Diagnosis present

## 2022-02-22 DIAGNOSIS — J90 Pleural effusion, not elsewhere classified: Secondary | ICD-10-CM | POA: Diagnosis not present

## 2022-02-22 DIAGNOSIS — E0590 Thyrotoxicosis, unspecified without thyrotoxic crisis or storm: Secondary | ICD-10-CM | POA: Diagnosis present

## 2022-02-22 DIAGNOSIS — G2581 Restless legs syndrome: Secondary | ICD-10-CM | POA: Diagnosis present

## 2022-02-22 DIAGNOSIS — J45909 Unspecified asthma, uncomplicated: Secondary | ICD-10-CM | POA: Diagnosis present

## 2022-02-22 DIAGNOSIS — E1165 Type 2 diabetes mellitus with hyperglycemia: Secondary | ICD-10-CM | POA: Diagnosis present

## 2022-02-22 DIAGNOSIS — E039 Hypothyroidism, unspecified: Secondary | ICD-10-CM | POA: Diagnosis present

## 2022-02-22 DIAGNOSIS — Z6841 Body Mass Index (BMI) 40.0 and over, adult: Secondary | ICD-10-CM | POA: Diagnosis not present

## 2022-02-22 DIAGNOSIS — D696 Thrombocytopenia, unspecified: Secondary | ICD-10-CM | POA: Diagnosis present

## 2022-02-22 LAB — CBC
HCT: 27.4 % — ABNORMAL LOW (ref 36.0–46.0)
Hemoglobin: 9.1 g/dL — ABNORMAL LOW (ref 12.0–15.0)
MCH: 28.3 pg (ref 26.0–34.0)
MCHC: 33.2 g/dL (ref 30.0–36.0)
MCV: 85.4 fL (ref 80.0–100.0)
Platelets: 162 10*3/uL (ref 150–400)
RBC: 3.21 MIL/uL — ABNORMAL LOW (ref 3.87–5.11)
RDW: 14.6 % (ref 11.5–15.5)
WBC: 2.3 10*3/uL — ABNORMAL LOW (ref 4.0–10.5)
nRBC: 0 % (ref 0.0–0.2)

## 2022-02-22 LAB — RESPIRATORY PANEL BY PCR

## 2022-02-22 LAB — ECHOCARDIOGRAM COMPLETE
AR max vel: 2.19 cm2
AV Area VTI: 2.18 cm2
AV Area mean vel: 2.22 cm2
AV Mean grad: 11 mmHg
AV Peak grad: 21 mmHg
Ao pk vel: 2.29 m/s
Area-P 1/2: 3.08 cm2
Height: 60 in
S' Lateral: 3.8 cm
Weight: 3604.96 oz

## 2022-02-22 LAB — BASIC METABOLIC PANEL
Anion gap: 7 (ref 5–15)
BUN: 19 mg/dL (ref 8–23)
CO2: 24 mmol/L (ref 22–32)
Calcium: 8.2 mg/dL — ABNORMAL LOW (ref 8.9–10.3)
Chloride: 108 mmol/L (ref 98–111)
Creatinine, Ser: 1.25 mg/dL — ABNORMAL HIGH (ref 0.44–1.00)
GFR, Estimated: 47 mL/min — ABNORMAL LOW (ref >60)
Glucose, Bld: 96 mg/dL (ref 70–99)
Potassium: 3.8 mmol/L (ref 3.5–5.1)
Sodium: 139 mmol/L (ref 135–145)

## 2022-02-22 LAB — HEMOGLOBIN A1C
Hgb A1c MFr Bld: 5.7 % — ABNORMAL HIGH (ref 4.8–5.6)
Mean Plasma Glucose: 117 mg/dL

## 2022-02-22 LAB — MRSA NEXT GEN BY PCR, NASAL: MRSA by PCR Next Gen: NOT DETECTED

## 2022-02-22 LAB — EXPECTORATED SPUTUM ASSESSMENT W GRAM STAIN, RFLX TO RESP C

## 2022-02-22 LAB — CBG MONITORING, ED
Glucose-Capillary: 102 mg/dL — ABNORMAL HIGH (ref 70–99)
Glucose-Capillary: 159 mg/dL — ABNORMAL HIGH (ref 70–99)
Glucose-Capillary: 156 mg/dL — ABNORMAL HIGH (ref 70–99)
Glucose-Capillary: 218 mg/dL — ABNORMAL HIGH (ref 70–99)
Glucose-Capillary: 172 mg/dL — ABNORMAL HIGH (ref 70–99)

## 2022-02-22 LAB — MAGNESIUM: Magnesium: 1.8 mg/dL (ref 1.7–2.4)

## 2022-02-22 LAB — STREP PNEUMONIAE URINARY ANTIGEN: Strep Pneumo Urinary Antigen: NEGATIVE

## 2022-02-22 LAB — HIV ANTIBODY (ROUTINE TESTING W REFLEX): HIV Screen 4th Generation wRfx: NONREACTIVE

## 2022-02-22 LAB — PROCALCITONIN: Procalcitonin: <0.1 ng/mL

## 2022-02-22 MED ORDER — ACETAMINOPHEN 650 MG RE SUPP: 650.0000 mg | Freq: Four times a day (QID) | RECTAL | Status: DC | PRN

## 2022-02-22 MED ORDER — ENOXAPARIN SODIUM 60 MG/0.6ML IJ SOSY
50.0000 mg | PREFILLED_SYRINGE | INTRAMUSCULAR | Status: DC
  Administered 2022-02-22 – 2022-02-27 (×6): 50 mg via SUBCUTANEOUS
  Filled 2022-02-22 (×6): qty 0.6

## 2022-02-22 MED ORDER — ROPINIROLE HCL 1 MG PO TABS
2.0000 mg | ORAL_TABLET | Freq: Every day | ORAL | Status: DC
  Administered 2022-02-22 – 2022-02-26 (×5): 2 mg via ORAL
  Filled 2022-02-22 (×5): qty 2

## 2022-02-22 MED ORDER — SENNOSIDES-DOCUSATE SODIUM 8.6-50 MG PO TABS: 1.0000 | ORAL_TABLET | Freq: Every evening | ORAL | Status: DC | PRN

## 2022-02-22 MED ORDER — IPRATROPIUM-ALBUTEROL 0.5-2.5 (3) MG/3ML IN SOLN: 3.0000 mL | RESPIRATORY_TRACT | Status: DC | PRN

## 2022-02-22 MED ORDER — OXYCODONE HCL 5 MG PO TABS
5.0000 mg | ORAL_TABLET | ORAL | Status: DC | PRN
  Administered 2022-02-23: 5 mg via ORAL
  Filled 2022-02-22: qty 1

## 2022-02-22 MED ORDER — SODIUM CHLORIDE 0.9% FLUSH
3.0000 mL | Freq: Two times a day (BID) | INTRAVENOUS | Status: DC
  Administered 2022-02-22 – 2022-02-26 (×11): 3 mL via INTRAVENOUS

## 2022-02-22 MED ORDER — IOHEXOL 350 MG/ML SOLN
75.0000 mL | Freq: Once | INTRAVENOUS | Status: AC | PRN
  Administered 2022-02-22: 75 mL via INTRAVENOUS

## 2022-02-22 MED ORDER — QUETIAPINE FUMARATE 50 MG PO TABS
50.0000 mg | ORAL_TABLET | Freq: Every day | ORAL | Status: DC
  Administered 2022-02-22 – 2022-02-26 (×5): 50 mg via ORAL
  Filled 2022-02-22 (×4): qty 1
  Filled 2022-02-22: qty 2

## 2022-02-22 MED ORDER — FUROSEMIDE 10 MG/ML IJ SOLN
40.0000 mg | Freq: Once | INTRAMUSCULAR | Status: AC
  Administered 2022-02-22: 40 mg via INTRAVENOUS
  Filled 2022-02-22: qty 4

## 2022-02-22 MED ORDER — ONDANSETRON HCL 4 MG PO TABS: 4.0000 mg | ORAL_TABLET | Freq: Four times a day (QID) | ORAL | Status: DC | PRN

## 2022-02-22 MED ORDER — ACETAMINOPHEN 325 MG PO TABS
650.0000 mg | ORAL_TABLET | Freq: Four times a day (QID) | ORAL | Status: DC | PRN
  Administered 2022-02-22 – 2022-02-24 (×2): 650 mg via ORAL
  Filled 2022-02-22 (×2): qty 2

## 2022-02-22 MED ORDER — SODIUM CHLORIDE 0.9 % IV SOLN
500.0000 mg | INTRAVENOUS | Status: AC
  Administered 2022-02-22 – 2022-02-26 (×5): 500 mg via INTRAVENOUS
  Filled 2022-02-22 (×5): qty 5

## 2022-02-22 MED ORDER — BUDESONIDE 0.5 MG/2ML IN SUSP
0.5000 mg | Freq: Two times a day (BID) | RESPIRATORY_TRACT | Status: DC
  Administered 2022-02-22 – 2022-02-27 (×10): 0.5 mg via RESPIRATORY_TRACT
  Filled 2022-02-22 (×10): qty 2

## 2022-02-22 MED ORDER — IPRATROPIUM-ALBUTEROL 0.5-2.5 (3) MG/3ML IN SOLN
3.0000 mL | Freq: Four times a day (QID) | RESPIRATORY_TRACT | Status: DC
  Administered 2022-02-22 – 2022-02-24 (×9): 3 mL via RESPIRATORY_TRACT
  Filled 2022-02-22 (×9): qty 3

## 2022-02-22 MED ORDER — HYDRALAZINE HCL 20 MG/ML IJ SOLN: 10.0000 mg | INTRAMUSCULAR | Status: DC | PRN

## 2022-02-22 MED ORDER — GUAIFENESIN 100 MG/5ML PO LIQD: 5.0000 mL | ORAL | Status: DC | PRN

## 2022-02-22 MED ORDER — ACETAMINOPHEN 325 MG PO TABS: 650.0000 mg | ORAL_TABLET | Freq: Four times a day (QID) | ORAL | Status: DC | PRN

## 2022-02-22 MED ORDER — PROPYLTHIOURACIL 50 MG PO TABS
50.0000 mg | ORAL_TABLET | Freq: Every day | ORAL | Status: DC
  Administered 2022-02-22 – 2022-02-27 (×6): 50 mg via ORAL
  Filled 2022-02-22 (×6): qty 1

## 2022-02-22 MED ORDER — ONDANSETRON HCL 4 MG/2ML IJ SOLN
4.0000 mg | Freq: Four times a day (QID) | INTRAMUSCULAR | Status: DC | PRN
  Administered 2022-02-22 – 2022-02-23 (×2): 4 mg via INTRAVENOUS
  Filled 2022-02-22 (×2): qty 2

## 2022-02-22 MED ORDER — INSULIN ASPART 100 UNIT/ML IJ SOLN
0.0000 [IU] | Freq: Three times a day (TID) | INTRAMUSCULAR | Status: DC
  Administered 2022-02-22: 2 [IU] via SUBCUTANEOUS
  Administered 2022-02-24: 3 [IU] via SUBCUTANEOUS
  Administered 2022-02-24: 2 [IU] via SUBCUTANEOUS
  Administered 2022-02-24: 3 [IU] via SUBCUTANEOUS
  Administered 2022-02-25 (×3): 1 [IU] via SUBCUTANEOUS
  Administered 2022-02-26: 2 [IU] via SUBCUTANEOUS

## 2022-02-22 MED ORDER — CLONAZEPAM 0.5 MG PO TABS: 0.5000 mg | ORAL_TABLET | Freq: Three times a day (TID) | ORAL | Status: DC | PRN

## 2022-02-22 MED ORDER — FLUOXETINE HCL 20 MG PO CAPS
60.0000 mg | ORAL_CAPSULE | Freq: Every day | ORAL | Status: DC
  Administered 2022-02-22 – 2022-02-26 (×5): 60 mg via ORAL
  Filled 2022-02-22 (×5): qty 3

## 2022-02-22 MED ORDER — INSULIN DETEMIR 100 UNIT/ML ~~LOC~~ SOLN
25.0000 [IU] | Freq: Two times a day (BID) | SUBCUTANEOUS | Status: DC
  Administered 2022-02-22 – 2022-02-26 (×9): 25 [IU] via SUBCUTANEOUS
  Filled 2022-02-22 (×10): qty 0.25

## 2022-02-22 MED ORDER — INSULIN ASPART 100 UNIT/ML IJ SOLN
0.0000 [IU] | Freq: Every day | INTRAMUSCULAR | Status: DC
  Administered 2022-02-23 – 2022-02-26 (×3): 2 [IU] via SUBCUTANEOUS

## 2022-02-22 MED ORDER — ENOXAPARIN SODIUM 40 MG/0.4ML IJ SOSY: 40.0000 mg | PREFILLED_SYRINGE | INTRAMUSCULAR | Status: DC

## 2022-02-22 MED ORDER — METOPROLOL TARTRATE 5 MG/5ML IV SOLN: 5.0000 mg | INTRAVENOUS | Status: DC | PRN

## 2022-02-22 MED ORDER — POTASSIUM CHLORIDE CRYS ER 20 MEQ PO TBCR
20.0000 meq | EXTENDED_RELEASE_TABLET | Freq: Once | ORAL | Status: AC
  Administered 2022-02-22: 20 meq via ORAL
  Filled 2022-02-22: qty 1

## 2022-02-22 MED ORDER — GABAPENTIN 300 MG PO CAPS: 300.0000 mg | ORAL_CAPSULE | Freq: Three times a day (TID) | ORAL | Status: DC | PRN

## 2022-02-22 MED ORDER — OXYBUTYNIN CHLORIDE ER 10 MG PO TB24
10.0000 mg | ORAL_TABLET | Freq: Every day | ORAL | Status: DC
  Administered 2022-02-22 – 2022-02-27 (×6): 10 mg via ORAL
  Filled 2022-02-22 (×6): qty 1

## 2022-02-22 MED ORDER — PANTOPRAZOLE SODIUM 40 MG PO TBEC
40.0000 mg | DELAYED_RELEASE_TABLET | Freq: Every day | ORAL | Status: DC
  Administered 2022-02-22 – 2022-02-27 (×6): 40 mg via ORAL
  Filled 2022-02-22 (×6): qty 1

## 2022-02-22 NOTE — ED Notes (Signed)
Pt sating at 90% and asking to have oxygen increased from 5L, showing some sob with talking. MD Bero advised high flow nasal cannula applied.

## 2022-02-22 NOTE — ED Notes (Signed)
Patient transported to CT 

## 2022-02-22 NOTE — Progress Notes (Incomplete)
Echocardiogram 2D Echocardiogram has been performed.  Ronny Flurry 02/22/2022, 3:20 PM

## 2022-02-22 NOTE — Progress Notes (Signed)
PROGRESS NOTE    [name]  IDP:[id number] DOB: 06-26-1950 DOA: 02/21/2022 PCP: Crist Infante, MD   Brief Narrative:  71 year old with history of HTN, insulin-dependent DM 2, OSA on CPAP, and depression, anxiety, cirrhosis, pancytopenia comes to the ED with worsening shortness of breath.  She was diagnosed of pneumonia and started on antibiotics about a week ago but due to worsening of shortness of breath she came to the ER.  In the ED CT chest was negative for PE but concerning for diffuse bilateral airspace opacity.  COVID/flu were negative.  BNP 530.  She was started on IV vancomycin, cefepime.   Assessment & Plan:  Principal Problem:   Pneumonia Active Problems:   Insulin dependent type 2 diabetes mellitus (HCC)   Acute respiratory failure with hypoxia (HCC)   Stage 3b chronic kidney disease (CKD) ([place])   Depression with anxiety   Leukopenia    1.  Multifocal pneumonia; acute hypoxic respiratory failure  -Not on home oxygen.  Currently requiring 5 to 6 L of nasal cannula.  Empiric antibiotics vancomycin, cefepime and azithromycin.  Procalcitonin negative.  Will check respiratory viral panel - Strep pneumo, urine Legionella - Supplemental oxygen, bronchodilators, I-S/flutter valve Echo ordered Lasix '40mg'$  iv once.    2. Insulin-dependent DM  -Check A1c - Sliding scale and Accu-Cheks   3. Hypertension  -Home meds on hold.  Currently on IV as needed   4. CKD IIIb  -Around baseline creatinine 1.3   5. Anemia; leukopenia  - Followed by hematology, Dr Lorenso Courier,  for chronic pancytopenia attributed to IDA from bariatric surgery and suspected cirrhosis with splenomegaly - Platelets normal on admission with improved leukopenia and stable normocytic anemia     6. Depression, anxiety  - Continue Prozac, Seroquel, and as-needed Klonopin     DVT prophylaxis: Lovenox Code Status: Full code Family Communication:  Family at bedside  Status is: Inpatient Still quite sob with  minimal movement.     Subjective: Feels little better but still quite exertional SOB   Examination:  General exam: Appears calm and comfortable  Respiratory system: bibasilar crackles.  Cardiovascular system: S1 & S2 heard, RRR. No JVD, murmurs, rubs, gallops or clicks. No pedal edema. Gastrointestinal system: Abdomen is nondistended, soft and nontender. No organomegaly or masses felt. Normal bowel sounds heard. Central nervous system: Alert and oriented. No focal neurological deficits. Extremities: Symmetric 5 x 5 power. Skin: No rashes, lesions or ulcers Psychiatry: Judgement and insight appear normal. Mood & affect appropriate.     Objective: Vitals:   02/22/22 0730 02/22/22 0745 02/22/22 0800 02/22/22 0815  BP: 91/61 91/63    Pulse: 66 67 70 76  Resp: '18 18 19 '$ (!) 21  Temp:      TempSrc:      SpO2: 93% (!) 89% 93% 96%  Weight:      Height:       No intake or output data in the 24 hours ending 02/22/22 1026 Filed Weights   02/21/22 1701  Weight: 102.2 kg     Data Reviewed:   CBC: Recent Labs  Lab 02/15/22 1446 02/21/22 1715 02/22/22 0306  WBC 3.3* 3.9* 2.3*  NEUTROABS 2.7 3.4  --   HGB 10.7* 10.6* 9.1*  HCT 32.0* 34.0* 27.4*  MCV 84.0 87.6 85.4  PLT 122* 183 443   Basic Metabolic Panel: Recent Labs  Lab 02/15/22 1446 02/21/22 1715 02/22/22 0306  NA 142 142 139  K 3.4* 3.4* 3.8  CL 107 105 108  CO2 '27 26 24  '$ GLUCOSE 136* 89 96  BUN 38* 22 19  CREATININE 1.42* 1.39* 1.25*  CALCIUM 9.1 9.1 8.2*  MG  --   --  1.8   GFR: Estimated Creatinine Clearance: 45.7 mL/min (A) (by C-G formula based on SCr of 1.25 mg/dL (H)). Liver Function Tests: Recent Labs  Lab 02/15/22 1446 02/21/22 1715  AST 19 18  ALT 12 12  ALKPHOS 75 76  BILITOT 0.7 0.5  PROT 6.6 6.4*  ALBUMIN 3.7 2.5*   No results for input(s): "LIPASE", "AMYLASE" in the last 168 hours. No results for input(s): "AMMONIA" in the last 168 hours. Coagulation Profile: No results for  input(s): "INR", "PROTIME" in the last 168 hours. Cardiac Enzymes: No results for input(s): "CKTOTAL", "CKMB", "CKMBINDEX", "TROPONINI" in the last 168 hours. BNP (last 3 results) No results for input(s): "PROBNP" in the last 8760 hours. HbA1C: No results for input(s): "HGBA1C" in the last 72 hours. CBG: Recent Labs  Lab 02/22/22 0414 02/22/22 0853  GLUCAP 102* 159*   Lipid Profile: No results for input(s): "CHOL", "HDL", "LDLCALC", "TRIG", "CHOLHDL", "LDLDIRECT" in the last 72 hours. Thyroid Function Tests: No results for input(s): "TSH", "T4TOTAL", "FREET4", "T3FREE", "THYROIDAB" in the last 72 hours. Anemia Panel: No results for input(s): "VITAMINB12", "FOLATE", "FERRITIN", "TIBC", "IRON", "RETICCTPCT" in the last 72 hours. Sepsis Labs: Recent Labs  Lab 02/21/22 1715 02/22/22 0306  PROCALCITON  --  <0.10  LATICACIDVEN 1.1  --     Recent Results (from the past 240 hour(s))  Resp Panel by RT-PCR (Flu A&B, Covid) Anterior Nasal Swab     Status: None   Collection Time: 02/21/22  5:15 PM   Specimen: Anterior Nasal Swab  Result Value Ref Range Status   SARS Coronavirus 2 by RT PCR NEGATIVE NEGATIVE Final    Comment: (NOTE) SARS-CoV-2 target nucleic acids are NOT DETECTED.  The SARS-CoV-2 RNA is generally detectable in upper respiratory specimens during the acute phase of infection. The lowest concentration of SARS-CoV-2 viral copies this assay can detect is 138 copies/mL. A negative result does not preclude SARS-Cov-2 infection and should not be used as the sole basis for treatment or other patient management decisions. A negative result may occur with  improper specimen collection/handling, submission of specimen other than nasopharyngeal swab, presence of viral mutation(s) within the areas targeted by this assay, and inadequate number of viral copies(<138 copies/mL). A negative result must be combined with clinical observations, patient history, and  epidemiological information. The expected result is Negative.  Fact Sheet for Patients:  EntrepreneurPulse.com.au  Fact Sheet for Healthcare Providers:  IncredibleEmployment.be  This test is no t yet approved or cleared by the [place] FDA and  has been authorized for detection and/or diagnosis of SARS-CoV-2 by FDA under an Emergency Use Authorization (EUA). This EUA will remain  in effect (meaning this test can be used) for the duration of the COVID-19 declaration under Section 564(b)(1) of the Act, 21 U.S.C.section 360bbb-3(b)(1), unless the authorization is terminated  or revoked sooner.       Influenza A by PCR NEGATIVE NEGATIVE Final   Influenza B by PCR NEGATIVE NEGATIVE Final    Comment: (NOTE) The Xpert Xpress SARS-CoV-2/FLU/RSV plus assay is intended as an aid in the diagnosis of influenza from Nasopharyngeal swab specimens and should not be used as a sole basis for treatment. Nasal washings and aspirates are unacceptable for Xpert Xpress SARS-CoV-2/FLU/RSV testing.  Fact Sheet for Patients: EntrepreneurPulse.com.au  Fact Sheet for Healthcare Providers:  IncredibleEmployment.be  This test is not yet approved or cleared by the [place] and has been authorized for detection and/or diagnosis of SARS-CoV-2 by FDA under an Emergency Use Authorization (EUA). This EUA will remain in effect (meaning this test can be used) for the duration of the COVID-19 declaration under Section 564(b)(1) of the Act, 21 U.S.C. section 360bbb-3(b)(1), unless the authorization is terminated or revoked.  Performed at [facility], [place] [address], [place], [place] [address]   MRSA Next Gen by PCR, Nasal     Status: None   Collection Time: 02/21/22 11:46 PM   Specimen: Nasal Mucosa; Nasal Swab  Result Value Ref Range Status   MRSA by PCR Next Gen NOT DETECTED NOT DETECTED Final    Comment:  (NOTE) The GeneXpert MRSA Assay (FDA approved for NASAL specimens only), is one component of a comprehensive MRSA colonization surveillance program. It is not intended to diagnose MRSA infection nor to guide or monitor treatment for MRSA infections. Test performance is not FDA approved in patients less than 24 years old. Performed at [facility], [place] [address] [place], [place] [address]          Radiology Studies: CT Angio Chest PE W and/or Wo Contrast  Result Date: 02/22/2022 CLINICAL DATA:  Pulmonary embolism (PE) suspected, high prob. Difficulty breathing. EXAM: CT ANGIOGRAPHY CHEST WITH CONTRAST TECHNIQUE: Multidetector CT imaging of the chest was performed using the standard protocol during bolus administration of intravenous contrast. Multiplanar CT image reconstructions and MIPs were obtained to evaluate the vascular anatomy. RADIATION DOSE REDUCTION: This exam was performed according to the departmental dose-optimization program which includes automated exposure control, adjustment of the mA and/or kV according to patient size and/or use of iterative reconstruction technique. CONTRAST:  21m OMNIPAQUE IOHEXOL 350 MG/ML SOLN COMPARISON:  11/17/2020 FINDINGS: Cardiovascular: No filling defects in the pulmonary arteries to suggest pulmonary emboli. Heart is normal size. Aorta is normal caliber. Mediastinum/Nodes: Mildly prominent mediastinal lymph nodes. No axillary or hilar adenopathy. Lungs/Pleura: Extensive bilateral airspace disease, right greater than left. Trace bilateral effusions. Upper Abdomen: No acute findings Musculoskeletal: Chest wall soft tissues are unremarkable. No acute bony abnormality. Review of the MIP images confirms the above findings. IMPRESSION: No evidence of pulmonary embolus. Diffuse bilateral airspace disease, left greater than right most compatible with pneumonia. Trace bilateral effusions. Electronically Signed   By: KRolm BaptiseM.D.   On: 02/22/2022  02:11   DG Chest 2 View  Result Date: 02/21/2022 CLINICAL DATA:  Hypoxia and shortness of breath.  Pneumonia. EXAM: CHEST - 2 VIEW COMPARISON:  Two-view chest x-ray 01/08/2017 FINDINGS: Heart is enlarged. Asymmetric interstitial and airspace opacities are present in the right lung. Less prominent airspace disease in the left is worse superiorly. No definite effusions are present. Right shoulder arthroplasty noted. Progressive advanced degenerative changes are present in the left shoulder. IMPRESSION: 1. Asymmetric interstitial and airspace opacities, right greater than left. This is concerning for multifocal pneumonia. 2. Cardiomegaly without failure. Electronically Signed   By: CSan MorelleM.D.   On: 02/21/2022 17:48        Scheduled Meds:  enoxaparin (LOVENOX) injection  50 mg Subcutaneous Q24H   FLUoxetine  60 mg Oral QHS   insulin aspart  0-5 Units Subcutaneous QHS   insulin aspart  0-6 Units Subcutaneous TID WC   insulin detemir  25 Units Subcutaneous BID   oxybutynin  10 mg Oral Daily   pantoprazole  40 mg Oral Daily  propylthiouracil  50 mg Oral Daily   QUEtiapine  50 mg Oral QHS   rOPINIRole  2 mg Oral QHS   sodium chloride flush  3 mL Intravenous Q12H   Continuous Infusions:  azithromycin Stopped (02/22/22 0659)   ceFEPime (MAXIPIME) IV     [START ON 02/23/2022] vancomycin       LOS: 0 days   Time spent= 35 mins    Aisa Schoeppner Arsenio Loader, MD [facility]  If 7PM-7AM, please contact night-coverage  02/22/2022, 10:26 AM

## 2022-02-22 NOTE — ED Notes (Signed)
Pt stated to me that she is on Ozempic and that she has not told anyone else this.

## 2022-02-22 NOTE — ED Notes (Signed)
Pt requesting recliner to sit in and a wheelchair for husband to take her to bathroom as needed

## 2022-02-22 NOTE — H&P (Signed)
History and Physical    [name] QQP:[id number] DOB: January 03, 1951 DOA: 02/21/2022  PCP: Crist Infante, MD   Patient coming from: home   Chief Complaint: SOB, low O2 saturations   HPI: [name] is a 71 y.o. female with medical history significant for hypertension, insulin-dependent diabetes mellitus, OSA on CPAP, depression, anxiety, ultrasound in August suggestive of cirrhosis, and pancytopenia who presents emergency department with worsening shortness of breath.  Patient reports that she was diagnosed with pneumonia and started on antibiotics almost a week ago but has had worsening shortness of breath over the past 2 days with increased cough, sometimes productive. She was too dyspneic to shower yesterday.  She has a pulse oximeter at home and noted her oxygen saturation to be in the 60s yesterday, prompting her presentation to the ED.  ED Course: Upon arrival to the ED, patient is found to be afebrile, saturating mid 60s on room air, tachypneic, and with systolic blood pressure 712 and greater.  EKG demonstrates sinus rhythm.  CT chest is negative for PE but concerning for diffuse bilateral airspace disease suspicious for pneumonia.  COVID and influenza PCR are negative.  Blood work notable for creatinine 1.39, albumin 2.5, WBC 3900, hemoglobin 10.6, BNP 530, and lactic acid 1.1.  Patient was started on supplemental oxygen and given 500 mL of normal saline, acetaminophen, vancomycin, cefepime, and Zofran in the ED.  Review of Systems:  All other systems reviewed and apart from HPI, are negative.  Past Medical History:  Diagnosis Date   Anemia    Arthritis    Asthma    OCCAS   Bell's palsy    Broken neck (HCC)    C-1   Carbuncle and furuncle of trunk    Chronic dislocation of right shoulder    Depression    Disorder of fascia    HX OF NECROTIC FASCITIS AFTER ABDOMINAL SURGERY FOR HERNIA- REQUIRED 19 SURGERIES AND 2.5 MONTH HOSPITALIZATION AT [facility]   DM (diabetes  mellitus) ([place])    Dysrhythmia    HX OF TACHYCARDIA AND BRADYCARDIA - ON GOING FOR YEARS - DOES NOT HAVE TO SEE CARDIOLOGIST   Elevated cholesterol    Fibromyalgia    Fracture MAY 2014   HX OF FRACTURED NECK C1- CAUSES SEVERE HEADACHES--LIMITED ROM NECK   Frequent infections    ESPECIALLY PRONE TO INFECTIONS AFTER SURGERIES   Graves disease    Heart murmur    DOES NOT CAUSE ANY PROBLEMS   History of kidney stones    Hyperlipidemia    Hypertension    Hypothyroidism    GRAVES DISEASE   Migraine    Nephrolithiasis    STAGE 3    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   Neuropathy    Obesity    OSA (obstructive sleep apnea)    USES CPAP - DOES NOT KNOW SETTING   Pain    LEFT SHOULDER  PAIN -HARD TO LIE ON LEFT SIDE FOR LONG PERIOD;  PAIN IN LOWER BADK - 3 HERNIATED DISCS AND STENOISIS -   PONV (postoperative nausea and vomiting)    THE GAS MAKES ME NAUSEATED   Restless leg syndrome    Sciatica    Tachycardia    Urinary frequency    Urticaria    UTI (urinary tract infection)     Past Surgical History:  Procedure Laterality Date   ABDOMINAL HYSTERECTOMY     LARGE TUMOR AT OVARY REMOVED   ANTERIOR LAT LUMBAR FUSION N/A 07/28/2018  Procedure: Anterolateral Decompression Lumbar One-Two for osteomyelitis reconstruction w/titanium strut allograft Fusion Lumbar One-Two;  Surgeon: Kristeen Miss, MD;  Location: [facility];  Service: Neurosurgery;  Laterality: N/A;  Left anterolateral approach   APPENDECTOMY     APPLICATION OF ROBOTIC ASSISTANCE FOR SPINAL PROCEDURE N/A 07/28/2018   Procedure: APPLICATION OF ROBOTIC ASSISTANCE FOR SPINAL PROCEDURE;  Surgeon: Kristeen Miss, MD;  Location: [facility];  Service: Neurosurgery;  Laterality: N/A;   CARPAL TUNNEL RELEASE     BILATERAL   CATARACT EXTRACTION W/ INTRAOCULAR LENS  IMPLANT, BILATERAL     CESAREAN SECTION     X 3   CHOLECYSTECTOMY     CYSTOSCOPY WITH URETEROSCOPY Right 12/03/2013   Procedure: CYSTOSCOPY WITH RIGHT RETROGRADE URETEROSCOPY LASER  LITHOTRIPSY RIGHT STONE RIGHT URETERAL STENT, ;  Surgeon: Raynelle Bring, MD;  Location: [facility];  Service: Urology;  Laterality: Right;  PROCEDURE WAS ORIGINALLY SCHEDULED AS RIGHT PERCUTANEOUS NEPHROLITHOTOMY   EYELID LACERATION REPAIR     RIGHT EYE   HERNIA REPAIR     ABDOMINAL HERNIA REPAIR WITH MESH - 3 SURGERIES    HX OF 19 SURGERIES FOR NECROTIC FASCITIS     SKIN GRAFTS+ WOUND  VAC   I&D OF INFECTED SITE IN [facility] - FROM AN INJECTION     IR FLUORO GUIDED NEEDLE PLC ASPIRATION/INJECTION LOC  06/03/2018   JOINT REPLACEMENT     TOTAL RIGHT KNEE REPLACEMENT   KNEE ARTHROSCOPY  RIGHT AND LEFT   X 2   LAPAROSCOPIC GASTRIC SLEEVE RESECTION  2014   NEPHROLITHOTOMY Right 12/10/2013   Procedure: NEPHROLITHOTOMY PERCUTANEOUS;  Surgeon: Raynelle Bring, MD;  Location: [facility];  Service: Urology;  Laterality: Right;   POSTERIOR LUMBAR FUSION 4 LEVEL N/A 07/28/2018   Procedure: Posterior Fixation Thoracic Ten-Lumbar Four with pedicle augmentaion with robotic assistance;  Surgeon: Kristeen Miss, MD;  Location: [facility];  Service: Neurosurgery;  Laterality: N/A;  Posterior Fixation Thoracic Ten-Lumbar Four with pedicle augmentaion with robotic assistance   REVERSE SHOULDER ARTHROPLASTY Right 07/21/2017   REVERSE SHOULDER ARTHROPLASTY Right 07/21/2017   Procedure: RIGHT REVERSE SHOULDER ARTHROPLASTY;  Surgeon: Tania Ade, MD;  Location: [facility];  Service: Orthopedics;  Laterality: Right;   RIGHT FOOT DRAINAGE OF INFECTION     shoulder arthroscopy Right    X 2   TONSILLECTOMY     AND ADENOIDECTOMY    Social History:   reports that she has never smoked. She has never used smokeless tobacco. She reports that she does not drink alcohol and does not use drugs.  Allergies  Allergen Reactions   Imitrex [Sumatriptan] Other (See Comments)    Vascular spasms   Nsaids Other (See Comments)    PT UNABLE TO TOLERATE NSAID'S DRUGS DUE TO HX OF GASTRIC SLEEVE SURGERY   Statins Other (See Comments)    Leg/muscle  pain   Sulfonamide Derivatives Hives    Childhood allergy    Methimazole Hives   Niacin     heart pounding and beating fast.   Codeine Nausea And Vomiting   Promethazine Hcl Other (See Comments)    Restless leg feeling all over body   Vicodin [Hydrocodone-Acetaminophen] Nausea And Vomiting    Family History  Problem Relation Age of Onset   Stroke Mother    Bladder Cancer Mother    Diabetes Father    Osteoarthritis Father    Heart disease Father    Ulcers Father    Suicidality Sister    Colon cancer Neg Hx    Stomach cancer Neg  Hx    Rectal cancer Neg Hx    Esophageal cancer Neg Hx    Pancreatic cancer Neg Hx      Prior to Admission medications   Medication Sig Start Date End Date Taking? Authorizing Provider  acetaminophen (TYLENOL) 650 MG CR tablet Take 1,300 mg by mouth every 8 (eight) hours as needed for pain.    [provider]  amLODipine (NORVASC) 5 MG tablet Take 5 mg by mouth at bedtime.    [provider]  BD INSULIN SYRINGE U/F 31G X 5/16" 0.5 ML MISC USE 5 TIMES A DAY LEVEMIR AND NOVOLOG 12/13/18   [provider]  BD PEN NEEDLE MICRO U/F 32G X 6 MM MISC Inject 32 pens into the skin daily. 06/18/19   [provider]  Cholecalciferol (DIALYVITE VITAMIN D 5000) 125 MCG (5000 UT) capsule Take 5,000 Units by mouth daily.    [provider]  clonazePAM (KLONOPIN) 0.5 MG tablet Take 0.5 mg by mouth See admin instructions. Take 0.5 mg at bedtime, may take an additional 0.5 mg tablet twice daily as needed for anxiety 07/01/14   [provider]  cyanocobalamin (VITAMIN B12) 1000 MCG tablet Take 1,000 mcg by mouth 3 (three) times a week.    [provider]  doxazosin (CARDURA) 2 MG tablet Take 2 mg by mouth daily. 12/04/18   [provider]  fenofibrate 54 MG tablet Take 54 mg by mouth daily with supper. 06/20/17   [provider]  FLUoxetine (PROZAC) 20 MG capsule Take 60 mg by mouth at bedtime.   06/28/17   [provider]  FREESTYLE TEST STRIPS test strip USE STRIPS TO TEST BLOOD GLUCOSE BID 10/03/18   [provider]  gabapentin (NEURONTIN) 300 MG capsule Take 1 capsule (300 mg total) by mouth 3 (three) times daily for 30 days. Patient taking differently: Take 300 mg by mouth 3 (three) times daily as needed (pain). 07/31/18 11/24/21  Donne Hazel, MD  hyoscyamine (LEVSIN SL) 0.125 MG SL tablet Place 1 tablet (0.125 mg total) under the tongue every 6 (six) hours as needed. 09/17/21   Levin Erp, PA  insulin aspart (NOVOLOG) 100 UNIT/ML FlexPen Sliding scale dose: CBG < 70: implement hypoglycemia protocol  CBG 70 - 120: 0 units  CBG 121 - 150: 0 units  CBG 151 - 200: 0 units  CBG 201 - 250: 2 units  CBG 251 - 300: 3 units  CBG 301 - 350: 4 units  CBG 351 - 400: 5 units Patient taking differently: Inject 10-20 Units into the skin 2 (two) times daily as needed for high blood sugar. 07/31/18   Donne Hazel, MD  insulin detemir (LEVEMIR) 100 UNIT/ML injection Inject 55 Units into the skin 2 (two) times daily.    [provider]  Lancets Misc. (UNISTIK 3 COMFORT) MISC USE LANCETS TO CHECK BLOOD GLUCOSE BID 11/30/18   [provider]  levocetirizine (XYZAL) 5 MG tablet Take 5 mg by mouth daily as needed for allergies.    [provider]  metoprolol tartrate (LOPRESSOR) 50 MG tablet Take 50 mg by mouth 3 (three) times daily. 12/04/18   [provider]  omeprazole (PRILOSEC) 20 MG capsule Take 20 mg by mouth daily with supper.     [provider]  ondansetron (ZOFRAN) 8 MG tablet Take 8 mg by mouth every 8 (eight) hours as needed for nausea or vomiting.    [provider]  oxybutynin (DITROPAN-XL) 10  MG 24 hr tablet Take 10 mg by mouth daily. 06/18/19   [provider]  propylthiouracil (PTU) 50 MG tablet Take 50 mg by mouth daily. 12/22/16   [provider]  QUEtiapine (SEROQUEL) 50 MG tablet Take  50 mg by mouth at bedtime. 06/18/19   [provider]  rOPINIRole (REQUIP) 2 MG tablet Take 2 mg by mouth See admin instructions. Take 2 mg at bedtime, may take a second 2 mg dose as needed for restless legs    [provider]  SYMBICORT 160-4.5 MCG/ACT inhaler Inhale 2 puffs into the lungs 2 (two) times daily. 11/11/21   [provider]  tiZANidine (ZANAFLEX) 4 MG tablet Take 4 mg by mouth 2 (two) times daily as needed for muscle spasms. 06/18/19   [provider]  VENTOLIN HFA 108 (90 Base) MCG/ACT inhaler Inhale 2 puffs into the lungs every 4 (four) hours as needed for shortness of breath. 11/11/21   [provider]    Physical Exam: Vitals:   02/22/22 0100 02/22/22 0216 02/22/22 0230 02/22/22 0245  BP: (!) 116/55  (!) 118/55 108/60  Pulse: 68  62 66  Resp: '19  18 15  '$ Temp:  98.6 F (37 C)    TempSrc:  Oral    SpO2: 94%  99% 98%  Weight:      Height:        Constitutional: NAD, no pallor or cyanosis  Eyes: PERTLA, lids and conjunctivae normal ENMT: Mucous membranes are moist. Posterior pharynx clear of any exudate or lesions.   Neck: supple, no masses  Respiratory: coarse rhonchi bilaterally, no wheezing. Dyspnea with speech.   Cardiovascular: S1 & S2 heard, regular rate and rhythm. No extremity edema.  Abdomen: No distension, no tenderness, soft. Bowel sounds active.  Musculoskeletal: no clubbing / cyanosis. No joint deformity upper and lower extremities.   Skin: no significant rashes, lesions, ulcers. Warm, dry, well-perfused. Neurologic: CN 2-12 grossly intact. Moving all extremities. Alert and oriented.  Psychiatric: Calm. Cooperative.    Labs and Imaging on Admission: I have personally reviewed following labs and imaging studies  CBC: Recent Labs  Lab 02/15/22 1446 02/21/22 1715  WBC 3.3* 3.9*  NEUTROABS 2.7 3.4  HGB 10.7* 10.6*  HCT 32.0* 34.0*  MCV 84.0 87.6  PLT 122* 528   Basic Metabolic Panel: Recent Labs  Lab  02/15/22 1446 02/21/22 1715  NA 142 142  K 3.4* 3.4*  CL 107 105  CO2 27 26  GLUCOSE 136* 89  BUN 38* 22  CREATININE 1.42* 1.39*  CALCIUM 9.1 9.1   GFR: Estimated Creatinine Clearance: 41.1 mL/min (A) (by C-G formula based on SCr of 1.39 mg/dL (H)). Liver Function Tests: Recent Labs  Lab 02/15/22 1446 02/21/22 1715  AST 19 18  ALT 12 12  ALKPHOS 75 76  BILITOT 0.7 0.5  PROT 6.6 6.4*  ALBUMIN 3.7 2.5*   No results for input(s): "LIPASE", "AMYLASE" in the last 168 hours. No results for input(s): "AMMONIA" in the last 168 hours. Coagulation Profile: No results for input(s): "INR", "PROTIME" in the last 168 hours. Cardiac Enzymes: No results for input(s): "CKTOTAL", "CKMB", "CKMBINDEX", "TROPONINI" in the last 168 hours. BNP (last 3 results) No results for input(s): "PROBNP" in the last 8760 hours. HbA1C: No results for input(s): "HGBA1C" in the last 72 hours. CBG: No results for input(s): "GLUCAP" in the last 168 hours. Lipid Profile: No results for input(s): "CHOL", "HDL", "LDLCALC", "TRIG", "CHOLHDL", "LDLDIRECT" in the last 72 hours.  Thyroid Function Tests: No results for input(s): "TSH", "T4TOTAL", "FREET4", "T3FREE", "THYROIDAB" in the last 72 hours. Anemia Panel: No results for input(s): "VITAMINB12", "FOLATE", "FERRITIN", "TIBC", "IRON", "RETICCTPCT" in the last 72 hours. Urine analysis:    Component Value Date/Time   COLORURINE YELLOW 03/05/2021 1916   APPEARANCEUR CLEAR 03/05/2021 1916   LABSPEC 1.020 03/05/2021 1916   PHURINE 6.0 03/05/2021 1916   GLUCOSEU 500 (A) 03/05/2021 1916   HGBUR TRACE (A) 03/05/2021 1916   BILIRUBINUR NEGATIVE 03/05/2021 1916   KETONESUR NEGATIVE 03/05/2021 1916   PROTEINUR NEGATIVE 03/05/2021 1916   UROBILINOGEN 0.2 07/20/2014 1707   NITRITE NEGATIVE 03/05/2021 1916   LEUKOCYTESUR NEGATIVE 03/05/2021 1916   Sepsis Labs: '@LABRCNTIP'$ (procalcitonin:4,lacticidven:4) ) Recent Results (from the past 240 hour(s))  Resp Panel by  RT-PCR (Flu A&B, Covid) Anterior Nasal Swab     Status: None   Collection Time: 02/21/22  5:15 PM   Specimen: Anterior Nasal Swab  Result Value Ref Range Status   SARS Coronavirus 2 by RT PCR NEGATIVE NEGATIVE Final    Comment: (NOTE) SARS-CoV-2 target nucleic acids are NOT DETECTED.  The SARS-CoV-2 RNA is generally detectable in upper respiratory specimens during the acute phase of infection. The lowest concentration of SARS-CoV-2 viral copies this assay can detect is 138 copies/mL. A negative result does not preclude SARS-Cov-2 infection and should not be used as the sole basis for treatment or other patient management decisions. A negative result may occur with  improper specimen collection/handling, submission of specimen other than nasopharyngeal swab, presence of viral mutation(s) within the areas targeted by this assay, and inadequate number of viral copies(<138 copies/mL). A negative result must be combined with clinical observations, patient history, and epidemiological information. The expected result is Negative.  Fact Sheet for Patients:  EntrepreneurPulse.com.au  Fact Sheet for Healthcare Providers:  IncredibleEmployment.be  This test is no t yet approved or cleared by the [place] FDA and  has been authorized for detection and/or diagnosis of SARS-CoV-2 by FDA under an Emergency Use Authorization (EUA). This EUA will remain  in effect (meaning this test can be used) for the duration of the COVID-19 declaration under Section 564(b)(1) of the Act, 21 U.S.C.section 360bbb-3(b)(1), unless the authorization is terminated  or revoked sooner.       Influenza A by PCR NEGATIVE NEGATIVE Final   Influenza B by PCR NEGATIVE NEGATIVE Final    Comment: (NOTE) The Xpert Xpress SARS-CoV-2/FLU/RSV plus assay is intended as an aid in the diagnosis of influenza from Nasopharyngeal swab specimens and should not be used as a sole basis for  treatment. Nasal washings and aspirates are unacceptable for Xpert Xpress SARS-CoV-2/FLU/RSV testing.  Fact Sheet for Patients: EntrepreneurPulse.com.au  Fact Sheet for Healthcare Providers: IncredibleEmployment.be  This test is not yet approved or cleared by the [place] FDA and has been authorized for detection and/or diagnosis of SARS-CoV-2 by FDA under an Emergency Use Authorization (EUA). This EUA will remain in effect (meaning this test can be used) for the duration of the COVID-19 declaration under Section 564(b)(1) of the Act, 21 U.S.C. section 360bbb-3(b)(1), unless the authorization is terminated or revoked.  Performed at [facility], [address] [place], [place] [address]   MRSA Next Gen by PCR, Nasal     Status: None   Collection Time: 02/21/22 11:46 PM   Specimen: Nasal Mucosa; Nasal Swab  Result Value Ref Range Status   MRSA by PCR Next Gen NOT DETECTED NOT DETECTED Final  Comment: (NOTE) The GeneXpert MRSA Assay (FDA approved for NASAL specimens only), is one component of a comprehensive MRSA colonization surveillance program. It is not intended to diagnose MRSA infection nor to guide or monitor treatment for MRSA infections. Test performance is not FDA approved in patients less than 29 years old. Performed at [facility], [place] [address] [place], [place] [address]      Radiological Exams on Admission: CT Angio Chest PE W and/or Wo Contrast  Result Date: 02/22/2022 CLINICAL DATA:  Pulmonary embolism (PE) suspected, high prob. Difficulty breathing. EXAM: CT ANGIOGRAPHY CHEST WITH CONTRAST TECHNIQUE: Multidetector CT imaging of the chest was performed using the standard protocol during bolus administration of intravenous contrast. Multiplanar CT image reconstructions and MIPs were obtained to evaluate the vascular anatomy. RADIATION DOSE REDUCTION: This exam was performed according to the departmental  dose-optimization program which includes automated exposure control, adjustment of the mA and/or kV according to patient size and/or use of iterative reconstruction technique. CONTRAST:  87m OMNIPAQUE IOHEXOL 350 MG/ML SOLN COMPARISON:  11/17/2020 FINDINGS: Cardiovascular: No filling defects in the pulmonary arteries to suggest pulmonary emboli. Heart is normal size. Aorta is normal caliber. Mediastinum/Nodes: Mildly prominent mediastinal lymph nodes. No axillary or hilar adenopathy. Lungs/Pleura: Extensive bilateral airspace disease, right greater than left. Trace bilateral effusions. Upper Abdomen: No acute findings Musculoskeletal: Chest wall soft tissues are unremarkable. No acute bony abnormality. Review of the MIP images confirms the above findings. IMPRESSION: No evidence of pulmonary embolus. Diffuse bilateral airspace disease, left greater than right most compatible with pneumonia. Trace bilateral effusions. Electronically Signed   By: KRolm BaptiseM.D.   On: 02/22/2022 02:11   DG Chest 2 View  Result Date: 02/21/2022 CLINICAL DATA:  Hypoxia and shortness of breath.  Pneumonia. EXAM: CHEST - 2 VIEW COMPARISON:  Two-view chest x-ray 01/08/2017 FINDINGS: Heart is enlarged. Asymmetric interstitial and airspace opacities are present in the right lung. Less prominent airspace disease in the left is worse superiorly. No definite effusions are present. Right shoulder arthroplasty noted. Progressive advanced degenerative changes are present in the left shoulder. IMPRESSION: 1. Asymmetric interstitial and airspace opacities, right greater than left. This is concerning for multifocal pneumonia. 2. Cardiomegaly without failure. Electronically Signed   By: CSan MorelleM.D.   On: 02/21/2022 17:48    EKG: Independently reviewed. Sinus rhythm.   Assessment/Plan   1. Pneumonia; acute hypoxic respiratory failure  - Requiring 6 Lpm supplemental O2 in ED  - Check sputum culture, check strep pneumo and  legionella antigens, continue broad-spectrum antibiotics, continue supplemental O2 as needed, trend procalcitonin    2. Insulin-dependent DM  - No recent A1c  - Check CBGs and continue insulin    3. Hypertension  - BP low-normal in ED and antihypertensives held initially   4. CKD IIIb  - SCr is 1.39 on admission, appears close to baseline  - Renally-dose medications, monitor    5. Anemia; leukopenia  - Followed by hematology for chronic pancytopenia attributed to IDA from bariatric surgery and suspected cirrhosis with splenomegaly  - Platelets normal on admission with improved leukopenia and stable normocytic anemia    6. Depression, anxiety  - Continue Prozac, Seroquel, and as-needed Klonopin    DVT prophylaxis: Lovenox  Code Status: Full  Level of Care: Level of care: Progressive Family Communication: Home  Disposition Plan:  Patient is from: home  Anticipated d/c is to: Home  Anticipated d/c date is: 02/26/22  Patient currently: pending improved respiratory status and  transition to oral medications  Consults called: none  Admission status: Inpatient     Vianne Bulls, MD [facility]  02/22/2022, 3:01 AM

## 2022-02-23 ENCOUNTER — Inpatient Hospital Stay (HOSPITAL_COMMUNITY): Payer: Medicare Other

## 2022-02-23 DIAGNOSIS — J189 Pneumonia, unspecified organism: Secondary | ICD-10-CM | POA: Diagnosis not present

## 2022-02-23 LAB — BASIC METABOLIC PANEL
Anion gap: 14 (ref 5–15)
BUN: 16 mg/dL (ref 8–23)
CO2: 28 mmol/L (ref 22–32)
Calcium: 9.3 mg/dL (ref 8.9–10.3)
Chloride: 102 mmol/L (ref 98–111)
Creatinine, Ser: 1.3 mg/dL — ABNORMAL HIGH (ref 0.44–1.00)
GFR, Estimated: 45 mL/min — ABNORMAL LOW (ref >60)
Glucose, Bld: 70 mg/dL (ref 70–99)
Potassium: 3.5 mmol/L (ref 3.5–5.1)
Sodium: 144 mmol/L (ref 135–145)

## 2022-02-23 LAB — URINALYSIS, ROUTINE W REFLEX MICROSCOPIC
Bilirubin Urine: NEGATIVE
Glucose, UA: NEGATIVE mg/dL
Hgb urine dipstick: NEGATIVE
Ketones, ur: NEGATIVE mg/dL
Leukocytes,Ua: NEGATIVE
Nitrite: NEGATIVE
Protein, ur: NEGATIVE mg/dL
Specific Gravity, Urine: 1.012 (ref 1.005–1.030)
pH: 5 (ref 5.0–8.0)

## 2022-02-23 LAB — CBC
HCT: 29.9 % — ABNORMAL LOW (ref 36.0–46.0)
Hemoglobin: 9.5 g/dL — ABNORMAL LOW (ref 12.0–15.0)
MCH: 27.6 pg (ref 26.0–34.0)
MCHC: 31.8 g/dL (ref 30.0–36.0)
MCV: 86.9 fL (ref 80.0–100.0)
Platelets: 170 10*3/uL (ref 150–400)
RBC: 3.44 MIL/uL — ABNORMAL LOW (ref 3.87–5.11)
RDW: 14.6 % (ref 11.5–15.5)
WBC: 2.9 10*3/uL — ABNORMAL LOW (ref 4.0–10.5)
nRBC: 0 % (ref 0.0–0.2)

## 2022-02-23 LAB — LEGIONELLA PNEUMOPHILA SEROGP 1 UR AG: L. pneumophila Serogp 1 Ur Ag: NEGATIVE

## 2022-02-23 LAB — GLUCOSE, CAPILLARY
Glucose-Capillary: 133 mg/dL — ABNORMAL HIGH (ref 70–99)
Glucose-Capillary: 101 mg/dL — ABNORMAL HIGH (ref 70–99)
Glucose-Capillary: 244 mg/dL — ABNORMAL HIGH (ref 70–99)

## 2022-02-23 LAB — CBG MONITORING, ED: Glucose-Capillary: 69 mg/dL — ABNORMAL LOW (ref 70–99)

## 2022-02-23 LAB — PROCALCITONIN: Procalcitonin: <0.1 ng/mL

## 2022-02-23 LAB — MAGNESIUM: Magnesium: 1.9 mg/dL (ref 1.7–2.4)

## 2022-02-23 MED ORDER — PHENAZOPYRIDINE HCL 200 MG PO TABS
200.0000 mg | ORAL_TABLET | Freq: Once | ORAL | Status: AC
  Administered 2022-02-23: 200 mg via ORAL
  Filled 2022-02-23: qty 1

## 2022-02-23 MED ORDER — SODIUM CHLORIDE 0.9 % IV SOLN: INTRAVENOUS | Status: DC | PRN

## 2022-02-23 MED ORDER — METHYLPREDNISOLONE SODIUM SUCC 40 MG IJ SOLR
40.0000 mg | Freq: Two times a day (BID) | INTRAMUSCULAR | Status: DC
  Administered 2022-02-23 – 2022-02-25 (×4): 40 mg via INTRAVENOUS
  Filled 2022-02-23 (×4): qty 1

## 2022-02-23 MED ORDER — FUROSEMIDE 10 MG/ML IJ SOLN
40.0000 mg | Freq: Two times a day (BID) | INTRAMUSCULAR | Status: AC
  Administered 2022-02-23 (×2): 40 mg via INTRAVENOUS
  Filled 2022-02-23 (×2): qty 4

## 2022-02-23 NOTE — ED Notes (Signed)
Pt set up in bed with breakfast tray

## 2022-02-23 NOTE — ED Notes (Signed)
Pt given OJ for CBG of 69

## 2022-02-23 NOTE — Progress Notes (Signed)
Patient resting comfortably on HHFNC. Patient stated she would fine without the CPAP tonight as she is comfortable with the [place]. Will continue to monitor. RN made aware.

## 2022-02-23 NOTE — Progress Notes (Signed)
PROGRESS NOTE    [name]  QIO:[id number] DOB: Nov 12, 1950 DOA: 02/21/2022 PCP: Crist Infante, MD   Brief Narrative:  71 year old with history of HTN, insulin-dependent DM 2, OSA on CPAP, and depression, anxiety, cirrhosis, pancytopenia comes to the ED with worsening shortness of breath.  She was diagnosed of pneumonia and started on antibiotics about a week ago but due to worsening of shortness of breath she came to the ER.  In the ED CT chest was negative for PE but concerning for diffuse bilateral airspace opacity.  COVID/flu were negative.  BNP 530.  She was started on IV vancomycin, cefepime.  MRSA was negative therefore vancomycin was discontinued.  Over the last 48 hours patient's oxygen requirement is increased.  Respiratory panel is negative.  Now getting IV diuretic, IV Solu-Medrol and speech and swallow evaluation   Assessment & Plan:  Principal Problem:   Pneumonia Active Problems:   Insulin dependent type 2 diabetes mellitus (HCC)   Acute respiratory failure with hypoxia (HCC)   Stage 3b chronic kidney disease (CKD) ([place])   Depression with anxiety   Leukopenia    1.  Multifocal pneumonia; acute hypoxic respiratory failure  Acute congestive heart failure with preserved ejection fraction, EF 55%.  Grade 4 - Increasing oxygen with current meds overnight, this morning on high flow.  MRSA negative, vancomycin discontinued.  Currently on cefepime and azithromycin, procalcitonin negative.  Low threshold to stop this but given worsening of symptoms I will opt to continue it for another day - Strep pneumo-negative, urine Legionella.  Respiratory panel was negative - Supplemental oxygen, bronchodilators, I-S/flutter valve -Echo showed EF of 55%, grade 1 DD - Will give Lasix 40 mg IV twice daily X2 doses.  Speech and swallow evaluation.  Add Solu-Medrol   2. Insulin-dependent DM  A1c-5.7 - Sliding scale and Accu-Cheks   3. Hypertension  -Home meds on hold.  Currently on IV as  needed   4. CKD IIIb  -Around baseline creatinine 1.3   5. Anemia; leukopenia  - Followed by hematology, Dr Lorenso Courier,  for chronic pancytopenia attributed to IDA from bariatric surgery and suspected cirrhosis with splenomegaly - Platelets normal on admission with improved leukopenia and stable normocytic anemia     6. Depression, anxiety  - Continue Prozac, Seroquel, and as-needed Klonopin     DVT prophylaxis: Lovenox Code Status: Full code Family Communication:  Family at bedside  Status is: Inpatient Still quite sob with minimal movement.  Patient is now on high flow    Subjective: Seen and examined at bedside.  Feels slightly better but her O2 requirement has increased. Examination: Constitutional: Not in acute distress, high flow Respiratory: Diminished breath sounds at the bases Cardiovascular: Normal sinus rhythm, no rubs Abdomen: Nontender nondistended good bowel sounds Musculoskeletal: No edema noted Skin: No rashes seen Neurologic: CN 2-12 grossly intact.  And nonfocal Psychiatric: Normal judgment and insight. Alert and oriented x 3. Normal mood.   Objective: Vitals:   02/23/22 0500 02/23/22 0600 02/23/22 0700 02/23/22 0800  BP: (!) 108/48 (!) 110/49 (!) 102/49 (!) 113/53  Pulse: 65 62 62 73  Resp: '18 17 15 19  '$ Temp:      TempSrc:      SpO2: 100% 100% 99% 100%  Weight:      Height:        Intake/Output Summary (Last 24 hours) at 02/23/2022 0902 Last data filed at 02/23/2022 0523 Gross per 24 hour  Intake 595.15 ml  Output 2200 ml  Net -  1604.85 ml   Filed Weights   02/21/22 1701  Weight: 102.2 kg     Data Reviewed:   CBC: Recent Labs  Lab 02/21/22 1715 02/22/22 0306 02/23/22 0425  WBC 3.9* 2.3* 2.9*  NEUTROABS 3.4  --   --   HGB 10.6* 9.1* 9.5*  HCT 34.0* 27.4* 29.9*  MCV 87.6 85.4 86.9  PLT 183 162 268   Basic Metabolic Panel: Recent Labs  Lab 02/21/22 1715 02/22/22 0306 02/23/22 0425  NA 142 139 144  K 3.4* 3.8 3.5  CL 105 108  102  CO2 '26 24 28  '$ GLUCOSE 89 96 70  BUN '22 19 16  '$ CREATININE 1.39* 1.25* 1.30*  CALCIUM 9.1 8.2* 9.3  MG  --  1.8 1.9   GFR: Estimated Creatinine Clearance: 44 mL/min (A) (by C-G formula based on SCr of 1.3 mg/dL (H)). Liver Function Tests: Recent Labs  Lab 02/21/22 1715  AST 18  ALT 12  ALKPHOS 76  BILITOT 0.5  PROT 6.4*  ALBUMIN 2.5*   No results for input(s): "LIPASE", "AMYLASE" in the last 168 hours. No results for input(s): "AMMONIA" in the last 168 hours. Coagulation Profile: No results for input(s): "INR", "PROTIME" in the last 168 hours. Cardiac Enzymes: No results for input(s): "CKTOTAL", "CKMB", "CKMBINDEX", "TROPONINI" in the last 168 hours. BNP (last 3 results) No results for input(s): "PROBNP" in the last 8760 hours. HbA1C: Recent Labs    02/22/22 0306  HGBA1C 5.7*   CBG: Recent Labs  Lab 02/22/22 0853 02/22/22 1159 02/22/22 1632 02/22/22 2135 02/23/22 0732  GLUCAP 159* 156* 218* 172* 69*   Lipid Profile: No results for input(s): "CHOL", "HDL", "LDLCALC", "TRIG", "CHOLHDL", "LDLDIRECT" in the last 72 hours. Thyroid Function Tests: No results for input(s): "TSH", "T4TOTAL", "FREET4", "T3FREE", "THYROIDAB" in the last 72 hours. Anemia Panel: No results for input(s): "VITAMINB12", "FOLATE", "FERRITIN", "TIBC", "IRON", "RETICCTPCT" in the last 72 hours. Sepsis Labs: Recent Labs  Lab 02/21/22 1715 02/22/22 0306 02/23/22 0425  PROCALCITON  --  <0.10 <0.10  LATICACIDVEN 1.1  --   --     Recent Results (from the past 240 hour(s))  Resp Panel by RT-PCR (Flu A&B, Covid) Anterior Nasal Swab     Status: None   Collection Time: 02/21/22  5:15 PM   Specimen: Anterior Nasal Swab  Result Value Ref Range Status   SARS Coronavirus 2 by RT PCR NEGATIVE NEGATIVE Final    Comment: (NOTE) SARS-CoV-2 target nucleic acids are NOT DETECTED.  The SARS-CoV-2 RNA is generally detectable in upper respiratory specimens during the acute phase of infection. The  lowest concentration of SARS-CoV-2 viral copies this assay can detect is 138 copies/mL. A negative result does not preclude SARS-Cov-2 infection and should not be used as the sole basis for treatment or other patient management decisions. A negative result may occur with  improper specimen collection/handling, submission of specimen other than nasopharyngeal swab, presence of viral mutation(s) within the areas targeted by this assay, and inadequate number of viral copies(<138 copies/mL). A negative result must be combined with clinical observations, patient history, and epidemiological information. The expected result is Negative.  Fact Sheet for Patients:  EntrepreneurPulse.com.au  Fact Sheet for Healthcare Providers:  IncredibleEmployment.be  This test is no t yet approved or cleared by the [place] FDA and  has been authorized for detection and/or diagnosis of SARS-CoV-2 by FDA under an Emergency Use Authorization (EUA). This EUA will remain  in effect (meaning this test can be used) for  the duration of the COVID-19 declaration under Section 564(b)(1) of the Act, 21 U.S.C.section 360bbb-3(b)(1), unless the authorization is terminated  or revoked sooner.       Influenza A by PCR NEGATIVE NEGATIVE Final   Influenza B by PCR NEGATIVE NEGATIVE Final    Comment: (NOTE) The Xpert Xpress SARS-CoV-2/FLU/RSV plus assay is intended as an aid in the diagnosis of influenza from Nasopharyngeal swab specimens and should not be used as a sole basis for treatment. Nasal washings and aspirates are unacceptable for Xpert Xpress SARS-CoV-2/FLU/RSV testing.  Fact Sheet for Patients: EntrepreneurPulse.com.au  Fact Sheet for Healthcare Providers: IncredibleEmployment.be  This test is not yet approved or cleared by the [place] FDA and has been authorized for detection and/or diagnosis of SARS-CoV-2 by FDA under  an Emergency Use Authorization (EUA). This EUA will remain in effect (meaning this test can be used) for the duration of the COVID-19 declaration under Section 564(b)(1) of the Act, 21 U.S.C. section 360bbb-3(b)(1), unless the authorization is terminated or revoked.  Performed at [facility], [place] [address], [place], [place] [address]   MRSA Next Gen by PCR, Nasal     Status: None   Collection Time: 02/21/22 11:46 PM   Specimen: Nasal Mucosa; Nasal Swab  Result Value Ref Range Status   MRSA by PCR Next Gen NOT DETECTED NOT DETECTED Final    Comment: (NOTE) The GeneXpert MRSA Assay (FDA approved for NASAL specimens only), is one component of a comprehensive MRSA colonization surveillance program. It is not intended to diagnose MRSA infection nor to guide or monitor treatment for MRSA infections. Test performance is not FDA approved in patients less than 61 years old. Performed at [facility], [place] [address], [place], [place] [address]   Expectorated Sputum Assessment w Gram Stain, Rflx to Resp Cult     Status: None   Collection Time: 02/22/22  3:00 AM   Specimen: Expectorated Sputum  Result Value Ref Range Status   Specimen Description EXPECTORATED SPUTUM  Final   Special Requests NONE  Final   Sputum evaluation   Final    THIS SPECIMEN IS ACCEPTABLE FOR SPUTUM CULTURE Performed at [facility], [place] [address], [place], [place] [address]    Report Status 02/22/2022 FINAL  Final  Culture, Respiratory w Gram Stain     Status: None (Preliminary result)   Collection Time: 02/22/22  3:00 AM  Result Value Ref Range Status   Specimen Description EXPECTORATED SPUTUM  Final   Special Requests NONE Reflexed from [id number]  Final   Gram Stain   Final    RARE WBC PRESENT, PREDOMINANTLY PMN RARE BUDDING YEAST SEEN Performed at [facility], [place] [address] [place],  [address]    Culture PENDING  Incomplete   Report Status PENDING  Incomplete  Respiratory  (~20 pathogens) panel by PCR     Status: None   Collection Time: 02/22/22 10:34 AM   Specimen: Nasopharyngeal Swab; Respiratory  Result Value Ref Range Status   Adenovirus NOT DETECTED NOT DETECTED Final   Coronavirus 229E NOT DETECTED NOT DETECTED Final    Comment: (NOTE) The Coronavirus on the Respiratory Panel, DOES NOT test for the novel  Coronavirus (2019 nCoV)    Coronavirus HKU1 NOT DETECTED NOT DETECTED Final   Coronavirus NL63 NOT DETECTED NOT DETECTED Final   Coronavirus OC43 NOT DETECTED NOT DETECTED Final   Metapneumovirus NOT DETECTED NOT DETECTED Final   Rhinovirus / Enterovirus NOT DETECTED  NOT DETECTED Final   Influenza A NOT DETECTED NOT DETECTED Final   Influenza B NOT DETECTED NOT DETECTED Final   Parainfluenza Virus 1 NOT DETECTED NOT DETECTED Final   Parainfluenza Virus 2 NOT DETECTED NOT DETECTED Final   Parainfluenza Virus 3 NOT DETECTED NOT DETECTED Final   Parainfluenza Virus 4 NOT DETECTED NOT DETECTED Final   Respiratory Syncytial Virus NOT DETECTED NOT DETECTED Final   Bordetella pertussis NOT DETECTED NOT DETECTED Final   Bordetella Parapertussis NOT DETECTED NOT DETECTED Final   Chlamydophila pneumoniae NOT DETECTED NOT DETECTED Final   Mycoplasma pneumoniae NOT DETECTED NOT DETECTED Final    Comment: Performed at [facility], [place] [address] [place], [place] [address]         Radiology Studies: ECHOCARDIOGRAM COMPLETE  Result Date: 02/22/2022    ECHOCARDIOGRAM REPORT   Patient Name:   MAYLING ABER Date of Exam: 02/22/2022 Medical Rec #:  [id number]      Height:       60.0 in Accession #:    [id number]     Weight:       225.3 lb Date of Birth:  02-Apr-1950      BSA:          1.964 m Patient Age:    76 years       BP:           136/55 mmHg Patient Gender: F              HR:           74 bpm. Exam Location:  Inpatient Procedure: 2D Echo, Cardiac Doppler and Color Doppler Indications:    Cardiomyopathy-Unspecified I42.9  History:        Patient has  prior history of Echocardiogram examinations, most                 recent 06/05/2018. Signs/Symptoms:Chest Pain; Risk                 Factors:Hypertension, Sleep Apnea, Diabetes and Dyslipidemia.  Sonographer:    Ronny Flurry Referring Phys: [id number] Terryn Rosenkranz CHIRAG Jillane Po IMPRESSIONS  1. Left ventricular ejection fraction, by estimation, is 55 to 60%. The left ventricle has normal function. The left ventricle has no regional wall motion abnormalities. Left ventricular diastolic parameters are consistent with Grade I diastolic dysfunction (impaired relaxation). Elevated left atrial pressure.  2. Right ventricular systolic function is normal. The right ventricular size is normal.  3. Left atrial size was mildly dilated.  4. The mitral valve is normal in structure. Mild mitral valve regurgitation.  5. The aortic valve is tricuspid. Aortic valve regurgitation is mild.  6. The inferior vena cava is normal in size with greater than 50% respiratory variability, suggesting right atrial pressure of 3 mmHg. Comparison(s): The left ventricular function is unchanged. FINDINGS  Left Ventricle: Left ventricular ejection fraction, by estimation, is 55 to 60%. The left ventricle has normal function. The left ventricle has no regional wall motion abnormalities. The left ventricular internal cavity size was normal in size. There is  no left ventricular hypertrophy. Left ventricular diastolic parameters are consistent with Grade I diastolic dysfunction (impaired relaxation). Elevated left atrial pressure. Right Ventricle: The right ventricular size is normal. Right vetricular wall thickness was not assessed. Right ventricular systolic function is normal. Left Atrium: Left atrial size was mildly dilated. Right Atrium: Right atrial size was normal in size. Pericardium: There is no evidence of pericardial effusion. Mitral Valve: The mitral  valve is normal in structure. Mild mitral valve regurgitation. Tricuspid Valve: The tricuspid valve  is normal in structure. Tricuspid valve regurgitation is trivial. Aortic Valve: The aortic valve is tricuspid. Aortic valve regurgitation is mild. Aortic valve mean gradient measures 11.0 mmHg. Aortic valve peak gradient measures 21.0 mmHg. Aortic valve area, by VTI measures 2.18 cm. Pulmonic Valve: The pulmonic valve was normal in structure. Pulmonic valve regurgitation is not visualized. Aorta: The aortic root is normal in size and structure. Venous: The inferior vena cava is normal in size with greater than 50% respiratory variability, suggesting right atrial pressure of 3 mmHg. IAS/Shunts: No atrial level shunt detected by color flow Doppler.  LEFT VENTRICLE PLAX 2D LVIDd:         5.10 cm   Diastology LVIDs:         3.80 cm   LV e' medial:    5.66 cm/s LV PW:         1.10 cm   LV E/e' medial:  23.0 LV IVS:        1.10 cm   LV e' lateral:   6.74 cm/s LVOT diam:     2.00 cm   LV E/e' lateral: 19.3 LV SV:         108 LV SV Index:   55 LVOT Area:     3.14 cm  RIGHT VENTRICLE RV S prime:     12.50 cm/s TAPSE (M-mode): 2.0 cm LEFT ATRIUM             Index        RIGHT ATRIUM           Index LA diam:        3.90 cm 1.99 cm/m   RA Area:     15.20 cm LA Vol (A2C):   79.6 ml 40.54 ml/m  RA Volume:   36.10 ml  18.38 ml/m LA Vol (A4C):   81.0 ml 41.25 ml/m LA Biplane Vol: 80.7 ml 41.10 ml/m  AORTIC VALVE AV Area (Vmax):    2.19 cm AV Area (Vmean):   2.22 cm AV Area (VTI):     2.18 cm AV Vmax:           229.00 cm/s AV Vmean:          156.000 cm/s AV VTI:            0.497 m AV Peak Grad:      21.0 mmHg AV Mean Grad:      11.0 mmHg LVOT Vmax:         160.00 cm/s LVOT Vmean:        110.000 cm/s LVOT VTI:          0.345 m LVOT/AV VTI ratio: 0.69  AORTA Ao Root diam: 3.00 cm MITRAL VALVE MV Area (PHT): 3.08 cm     SHUNTS MV Decel Time: 246 msec     Systemic VTI:  0.34 m MV E velocity: 130.00 cm/s  Systemic Diam: 2.00 cm MV A velocity: 151.00 cm/s MV E/A ratio:  0.86 Dorris Carnes MD Electronically signed by Dorris Carnes MD  Signature Date/Time: 02/22/2022/4:56:44 PM    Final    CT Angio Chest PE W and/or Wo Contrast  Result Date: 02/22/2022 CLINICAL DATA:  Pulmonary embolism (PE) suspected, high prob. Difficulty breathing. EXAM: CT ANGIOGRAPHY CHEST WITH CONTRAST TECHNIQUE: Multidetector CT imaging of the chest was performed using the standard protocol during bolus administration of intravenous contrast. Multiplanar CT image reconstructions and MIPs were  obtained to evaluate the vascular anatomy. RADIATION DOSE REDUCTION: This exam was performed according to the departmental dose-optimization program which includes automated exposure control, adjustment of the mA and/or kV according to patient size and/or use of iterative reconstruction technique. CONTRAST:  63m OMNIPAQUE IOHEXOL 350 MG/ML SOLN COMPARISON:  11/17/2020 FINDINGS: Cardiovascular: No filling defects in the pulmonary arteries to suggest pulmonary emboli. Heart is normal size. Aorta is normal caliber. Mediastinum/Nodes: Mildly prominent mediastinal lymph nodes. No axillary or hilar adenopathy. Lungs/Pleura: Extensive bilateral airspace disease, right greater than left. Trace bilateral effusions. Upper Abdomen: No acute findings Musculoskeletal: Chest wall soft tissues are unremarkable. No acute bony abnormality. Review of the MIP images confirms the above findings. IMPRESSION: No evidence of pulmonary embolus. Diffuse bilateral airspace disease, left greater than right most compatible with pneumonia. Trace bilateral effusions. Electronically Signed   By: KRolm BaptiseM.D.   On: 02/22/2022 02:11   DG Chest 2 View  Result Date: 02/21/2022 CLINICAL DATA:  Hypoxia and shortness of breath.  Pneumonia. EXAM: CHEST - 2 VIEW COMPARISON:  Two-view chest x-ray 01/08/2017 FINDINGS: Heart is enlarged. Asymmetric interstitial and airspace opacities are present in the right lung. Less prominent airspace disease in the left is worse superiorly. No definite effusions are present.  Right shoulder arthroplasty noted. Progressive advanced degenerative changes are present in the left shoulder. IMPRESSION: 1. Asymmetric interstitial and airspace opacities, right greater than left. This is concerning for multifocal pneumonia. 2. Cardiomegaly without failure. Electronically Signed   By: CSan MorelleM.D.   On: 02/21/2022 17:48        Scheduled Meds:  budesonide (PULMICORT) nebulizer solution  0.5 mg Nebulization BID   enoxaparin (LOVENOX) injection  50 mg Subcutaneous Q24H   FLUoxetine  60 mg Oral QHS   furosemide  40 mg Intravenous BID   insulin aspart  0-5 Units Subcutaneous QHS   insulin aspart  0-6 Units Subcutaneous TID WC   insulin detemir  25 Units Subcutaneous BID   ipratropium-albuterol  3 mL Nebulization Q6H   oxybutynin  10 mg Oral Daily   pantoprazole  40 mg Oral Daily   propylthiouracil  50 mg Oral Daily   QUEtiapine  50 mg Oral QHS   rOPINIRole  2 mg Oral QHS   sodium chloride flush  3 mL Intravenous Q12H   Continuous Infusions:  azithromycin Stopped (02/23/22 0523)   ceFEPime (MAXIPIME) IV Stopped (02/23/22 0015)     LOS: 1 day   Time spent= 35 mins    Iseah Plouff CArsenio Loader MD [facility]  If 7PM-7AM, please contact night-coverage  02/23/2022, 9:02 AM

## 2022-02-24 ENCOUNTER — Inpatient Hospital Stay (HOSPITAL_COMMUNITY): Payer: Medicare Other

## 2022-02-24 DIAGNOSIS — J189 Pneumonia, unspecified organism: Secondary | ICD-10-CM | POA: Diagnosis not present

## 2022-02-24 LAB — CBC
HCT: 31.2 % — ABNORMAL LOW (ref 36.0–46.0)
Hemoglobin: 10.4 g/dL — ABNORMAL LOW (ref 12.0–15.0)
MCH: 27.8 pg (ref 26.0–34.0)
MCHC: 33.3 g/dL (ref 30.0–36.0)
MCV: 83.4 fL (ref 80.0–100.0)
Platelets: 169 10*3/uL (ref 150–400)
RBC: 3.74 MIL/uL — ABNORMAL LOW (ref 3.87–5.11)
RDW: 14.1 % (ref 11.5–15.5)
WBC: 2.1 10*3/uL — ABNORMAL LOW (ref 4.0–10.5)
nRBC: 0 % (ref 0.0–0.2)

## 2022-02-24 LAB — MAGNESIUM: Magnesium: 1.8 mg/dL (ref 1.7–2.4)

## 2022-02-24 LAB — GLUCOSE, CAPILLARY
Glucose-Capillary: 218 mg/dL — ABNORMAL HIGH (ref 70–99)
Glucose-Capillary: 273 mg/dL — ABNORMAL HIGH (ref 70–99)
Glucose-Capillary: 266 mg/dL — ABNORMAL HIGH (ref 70–99)
Glucose-Capillary: 263 mg/dL — ABNORMAL HIGH (ref 70–99)

## 2022-02-24 LAB — BASIC METABOLIC PANEL
Anion gap: 10 (ref 5–15)
BUN: 24 mg/dL — ABNORMAL HIGH (ref 8–23)
CO2: 30 mmol/L (ref 22–32)
Calcium: 8.9 mg/dL (ref 8.9–10.3)
Chloride: 95 mmol/L — ABNORMAL LOW (ref 98–111)
Creatinine, Ser: 1.51 mg/dL — ABNORMAL HIGH (ref 0.44–1.00)
GFR, Estimated: 37 mL/min — ABNORMAL LOW (ref >60)
Glucose, Bld: 285 mg/dL — ABNORMAL HIGH (ref 70–99)
Potassium: 3.8 mmol/L (ref 3.5–5.1)
Sodium: 135 mmol/L (ref 135–145)

## 2022-02-24 LAB — PROCALCITONIN: Procalcitonin: <0.1 ng/mL

## 2022-02-24 MED ORDER — METOPROLOL TARTRATE 50 MG PO TABS
50.0000 mg | ORAL_TABLET | Freq: Three times a day (TID) | ORAL | Status: DC
  Administered 2022-02-24 – 2022-02-27 (×9): 50 mg via ORAL
  Filled 2022-02-24 (×9): qty 1

## 2022-02-24 MED ORDER — FENOFIBRATE 54 MG PO TABS
54.0000 mg | ORAL_TABLET | Freq: Every day | ORAL | Status: DC
  Administered 2022-02-24 – 2022-02-26 (×3): 54 mg via ORAL
  Filled 2022-02-24 (×3): qty 1

## 2022-02-24 MED ORDER — IPRATROPIUM-ALBUTEROL 0.5-2.5 (3) MG/3ML IN SOLN
3.0000 mL | Freq: Three times a day (TID) | RESPIRATORY_TRACT | Status: DC
  Administered 2022-02-24 – 2022-02-25 (×2): 3 mL via RESPIRATORY_TRACT
  Filled 2022-02-24 (×2): qty 3

## 2022-02-24 NOTE — Progress Notes (Signed)
RT NOTE: RT to room to check on patient. RT found patient had removed HHFNC and went to the bathroom. Patient in no distress with sats in low 90's. RT placed patient on 6L [place] with sats of 97%. VSS. RT will continue to monitor.

## 2022-02-24 NOTE — Evaluation (Signed)
Physical Therapy Evaluation Patient Details Name: [name] MRN: [id number] DOB: Jun 13, 1950 Today's Date: 02/24/2022  History of Present Illness  Pt is a 71 y.o. F who presents with worsening shortness of breath. Admitted with acute hypoxic respiratory failure due to multifocal PNA. Significant PMH: DM2, OSA on CPAP, depression, anxiety, cirrhosis, pancytopenia.  Clinical Impression  Pt admitted with above. Pt presents with decreased cardiopulmonary endurance and mild balance deficits. Pt able to perform therapeutic exercises and serial sit to stands for functional strengthening. Transferred from bed to chair using RW at a min guard assist level. SpO2 > 93% on HHFNC, HR 72, BP 158/81. PTA, pt lives with her spouse and is independent with mobility using a RW. Would benefit from HHPT at d/c to address deficits and maximize functional mobility.     Recommendations for follow up therapy are one component of a multi-disciplinary discharge planning process, led by the attending physician.  Recommendations may be updated based on patient status, additional functional criteria and insurance authorization.  Follow Up Recommendations Home health PT      Assistance Recommended at Discharge PRN  Patient can return home with the following  A little help with walking and/or transfers;A little help with bathing/dressing/bathroom;Assistance with cooking/housework;Assist for transportation;Help with stairs or ramp for entrance    Equipment Recommendations None recommended by PT  Recommendations for Other Services       Functional Status Assessment Patient has had a recent decline in their functional status and demonstrates the ability to make significant improvements in function in a reasonable and predictable amount of time.     Precautions / Restrictions Precautions Precautions: Fall;Other (comment) Precaution Comments: [place] Restrictions Weight Bearing Restrictions: No      Mobility  Bed  Mobility Overal bed mobility: Needs Assistance Bed Mobility: Supine to Sit     Supine to sit: Min assist     General bed mobility comments: Light minA to power up to sitting position    Transfers Overall transfer level: Needs assistance Equipment used: Rolling walker (2 wheels) Transfers: Sit to/from Stand, Bed to chair/wheelchair/BSC Sit to Stand: Min guard Stand pivot transfers: Min guard         General transfer comment: Min guard for safety    Ambulation/Gait                  Stairs            Wheelchair Mobility    Modified Rankin (Stroke Patients Only)       Balance Overall balance assessment: Mild deficits observed, not formally tested                                           Pertinent Vitals/Pain Pain Assessment Pain Assessment: No/denies pain    Home Living Family/patient expects to be discharged to:: Private residence Living Arrangements: Spouse/significant other Available Help at Discharge: Family Type of Home: House Home Access: Stairs to enter   Technical brewer of Steps: 2 (driveway is elevated and then has 2 steps to DESCEND to enter the house)   Home Layout: One level Home Equipment: Advice worker (2 wheels);BSC/3in1 (uses BSC for toilet riser, lift chair)      Prior Function Prior Level of Function : Needs assist             Mobility Comments: uses walker for ambulation to descend steps going  into home ADLs Comments: independent ADL's     Hand Dominance        Extremity/Trunk Assessment   Upper Extremity Assessment Upper Extremity Assessment: Overall WFL for tasks assessed    Lower Extremity Assessment Lower Extremity Assessment: Overall WFL for tasks assessed       Communication   Communication: No difficulties  Cognition Arousal/Alertness: Awake/alert Behavior During Therapy: WFL for tasks assessed/performed Overall Cognitive Status: Impaired/Different from  baseline Area of Impairment: Problem solving                             Problem Solving: Slow processing, Decreased initiation, Requires verbal cues, Difficulty sequencing, Requires tactile cues          General Comments      Exercises General Exercises - Lower Extremity Long Arc Quad: Both, 10 reps, Seated Heel Slides: Both, 10 reps, Supine Straight Leg Raises: Both, 10 reps, Supine Other Exercises Other Exercises: x3 sit to stands   Assessment/Plan    PT Assessment Patient needs continued PT services  PT Problem List Decreased strength;Decreased activity tolerance;Decreased balance;Decreased mobility;Cardiopulmonary status limiting activity       PT Treatment Interventions DME instruction;Gait training;Stair training;Functional mobility training;Therapeutic activities;Therapeutic exercise;Balance training;Patient/family education    PT Goals (Current goals can be found in the Care Plan section)  Acute Rehab PT Goals Patient Stated Goal: get out of bed PT Goal Formulation: With patient Time For Goal Achievement: 03/10/22 Potential to Achieve Goals: Good    Frequency Min 3X/week     Co-evaluation               AM-PAC PT "6 Clicks" Mobility  Outcome Measure Help needed turning from your back to your side while in a flat bed without using bedrails?: A Little Help needed moving from lying on your back to sitting on the side of a flat bed without using bedrails?: A Little Help needed moving to and from a bed to a chair (including a wheelchair)?: A Little Help needed standing up from a chair using your arms (e.g., wheelchair or bedside chair)?: A Little Help needed to walk in hospital room?: A Little Help needed climbing 3-5 steps with a railing? : A Lot 6 Click Score: 17    End of Session Equipment Utilized During Treatment: Oxygen;Gait belt Activity Tolerance: Patient tolerated treatment well Patient left: in chair;with call bell/phone within  reach;with family/visitor present Nurse Communication: Mobility status PT Visit Diagnosis: Difficulty in walking, not elsewhere classified (R26.2);Unsteadiness on feet (R26.81)    Time: 4193-7902 PT Time Calculation (min) (ACUTE ONLY): 33 min   Charges:   PT Evaluation $PT Eval Moderate Complexity: 1 Mod PT Treatments $Therapeutic Activity: 8-22 mins        Wyona Almas, PT, DPT Acute Rehabilitation Services Office [phone]   Deno Etienne 02/24/2022, 12:01 PM

## 2022-02-24 NOTE — Evaluation (Signed)
Clinical/Bedside Swallow Evaluation Patient Details  Name: [name] MRN: [id number] Date of Birth: 1950-12-06  Today's Date: 02/24/2022 Time: SLP Start Time (ACUTE ONLY): 45 SLP Stop Time (ACUTE ONLY): 1400 SLP Time Calculation (min) (ACUTE ONLY): 15 min  Past Medical History:  Past Medical History:  Diagnosis Date   Anemia    Arthritis    Asthma    OCCAS   Bell's palsy    Broken neck (HCC)    C-1   Carbuncle and furuncle of trunk    Chronic dislocation of right shoulder    Depression    Disorder of fascia    HX OF NECROTIC FASCITIS AFTER ABDOMINAL SURGERY FOR HERNIA- REQUIRED 19 SURGERIES AND 2.5 MONTH HOSPITALIZATION AT [facility]   DM (diabetes mellitus) ([place])    Dysrhythmia    HX OF TACHYCARDIA AND BRADYCARDIA - ON GOING FOR YEARS - DOES NOT HAVE TO SEE CARDIOLOGIST   Elevated cholesterol    Fibromyalgia    Fracture MAY 2014   HX OF FRACTURED NECK C1- CAUSES SEVERE HEADACHES--LIMITED ROM NECK   Frequent infections    ESPECIALLY PRONE TO INFECTIONS AFTER SURGERIES   Graves disease    Heart murmur    DOES NOT CAUSE ANY PROBLEMS   History of kidney stones    Hyperlipidemia    Hypertension    Hypothyroidism    GRAVES DISEASE   Migraine    Nephrolithiasis    STAGE 3    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   Neuropathy    Obesity    OSA (obstructive sleep apnea)    USES CPAP - DOES NOT KNOW SETTING   Pain    LEFT SHOULDER  PAIN -HARD TO LIE ON LEFT SIDE FOR LONG PERIOD;  PAIN IN LOWER BADK - 3 HERNIATED DISCS AND STENOISIS -   PONV (postoperative nausea and vomiting)    THE GAS MAKES ME NAUSEATED   Restless leg syndrome    Sciatica    Tachycardia    Urinary frequency    Urticaria    UTI (urinary tract infection)    Past Surgical History:  Past Surgical History:  Procedure Laterality Date   ABDOMINAL HYSTERECTOMY     LARGE TUMOR AT OVARY REMOVED   ANTERIOR LAT LUMBAR FUSION N/A 07/28/2018   Procedure: Anterolateral Decompression Lumbar One-Two for osteomyelitis  reconstruction w/titanium strut allograft Fusion Lumbar One-Two;  Surgeon: Kristeen Miss, MD;  Location: [facility];  Service: Neurosurgery;  Laterality: N/A;  Left anterolateral approach   APPENDECTOMY     APPLICATION OF ROBOTIC ASSISTANCE FOR SPINAL PROCEDURE N/A 07/28/2018   Procedure: APPLICATION OF ROBOTIC ASSISTANCE FOR SPINAL PROCEDURE;  Surgeon: Kristeen Miss, MD;  Location: [facility];  Service: Neurosurgery;  Laterality: N/A;   CARPAL TUNNEL RELEASE     BILATERAL   CATARACT EXTRACTION W/ INTRAOCULAR LENS  IMPLANT, BILATERAL     CESAREAN SECTION     X 3   CHOLECYSTECTOMY     CYSTOSCOPY WITH URETEROSCOPY Right 12/03/2013   Procedure: CYSTOSCOPY WITH RIGHT RETROGRADE URETEROSCOPY LASER LITHOTRIPSY RIGHT STONE RIGHT URETERAL STENT, ;  Surgeon: Raynelle Bring, MD;  Location: [facility];  Service: Urology;  Laterality: Right;  PROCEDURE WAS ORIGINALLY SCHEDULED AS RIGHT PERCUTANEOUS NEPHROLITHOTOMY   EYELID LACERATION REPAIR     RIGHT EYE   HERNIA REPAIR     ABDOMINAL HERNIA REPAIR WITH MESH - 3 SURGERIES    HX OF 19 SURGERIES FOR NECROTIC FASCITIS     SKIN GRAFTS+ WOUND  VAC   I&D  OF INFECTED SITE IN [place] - FROM AN INJECTION     IR FLUORO GUIDED NEEDLE PLC ASPIRATION/INJECTION LOC  06/03/2018   JOINT REPLACEMENT     TOTAL RIGHT KNEE REPLACEMENT   KNEE ARTHROSCOPY  RIGHT AND LEFT   X 2   LAPAROSCOPIC GASTRIC SLEEVE RESECTION  2014   NEPHROLITHOTOMY Right 12/10/2013   Procedure: NEPHROLITHOTOMY PERCUTANEOUS;  Surgeon: Raynelle Bring, MD;  Location: [facility];  Service: Urology;  Laterality: Right;   POSTERIOR LUMBAR FUSION 4 LEVEL N/A 07/28/2018   Procedure: Posterior Fixation Thoracic Ten-Lumbar Four with pedicle augmentaion with robotic assistance;  Surgeon: Kristeen Miss, MD;  Location: [facility];  Service: Neurosurgery;  Laterality: N/A;  Posterior Fixation Thoracic Ten-Lumbar Four with pedicle augmentaion with robotic assistance   REVERSE SHOULDER ARTHROPLASTY Right 07/21/2017   REVERSE SHOULDER  ARTHROPLASTY Right 07/21/2017   Procedure: RIGHT REVERSE SHOULDER ARTHROPLASTY;  Surgeon: Tania Ade, MD;  Location: [facility];  Service: Orthopedics;  Laterality: Right;   RIGHT FOOT DRAINAGE OF INFECTION     shoulder arthroscopy Right    X 2   TONSILLECTOMY     AND ADENOIDECTOMY   HPI:  Patient presents with a mild pharyngeal phase dysphagia characterized by decreased laryngeal closure resulting in trace intermittent penetration of thin liquid, never reaching the level of the vocal cords and clearing with subsequent swallows/cued throat clear, and moderate-severe post swallow vallecular residue, primarily with regular texture solids.    Assessment / Plan / Recommendation  Clinical Impression  Patient did not present with any overt s/s of aspiration or penetration as per this bedside swallow evaluation. She does, however, have a h/o dysphagia with a 2018 MBS showing "Patient presents with a mild pharyngeal phase dysphagia characterized by decreased laryngeal closure resulting in trace intermittent penetration of thin liquid, never reaching the level of the vocal cords and clearing with subsequent swallows/cued throat clear, and moderate-severe post swallow vallecular residue, primarily with regular texture solids." She currently has a PNA and is on 20L oxygen. Patient does not think she is having any difficulties swallowing and although she does not want to do a modified barium swallow study, she said she would if that was needed. She mentioned twice how her mother had to have a swallow test and she was aspirating, and this did contribute to her death. SLP will plan to proceed with MBS next date to r/o aspiration. SLP Visit Diagnosis: Dysphagia, unspecified (R13.10)    Aspiration Risk  Mild aspiration risk;No limitations    Diet Recommendation Regular;Thin liquid   Liquid Administration via: Cup;Straw Medication Administration: Whole meds with liquid Supervision: Patient able to self  feed Compensations: Slow rate;Small sips/bites Postural Changes: Seated upright at 90 degrees    Other  Recommendations Oral Care Recommendations: Oral care BID    Recommendations for follow up therapy are one component of a multi-disciplinary discharge planning process, led by the attending physician.  Recommendations may be updated based on patient status, additional functional criteria and insurance authorization.  Follow up Recommendations Other (comment) (pending MBS results)      Assistance Recommended at Discharge    Functional Status Assessment Patient has had a recent decline in their functional status and demonstrates the ability to make significant improvements in function in a reasonable and predictable amount of time.  Frequency and Duration min 1 x/week  1 week       Prognosis Prognosis for Safe Diet Advancement: Good      Swallow Study   General Date of Onset:  02/22/22 HPI: Patient presents with a mild pharyngeal phase dysphagia characterized by decreased laryngeal closure resulting in trace intermittent penetration of thin liquid, never reaching the level of the vocal cords and clearing with subsequent swallows/cued throat clear, and moderate-severe post swallow vallecular residue, primarily with regular texture solids. Type of Study: Bedside Swallow Evaluation Previous Swallow Assessment: MBS 2018: Patient presents with a mild pharyngeal phase dysphagia characterized by decreased laryngeal closure resulting in trace intermittent penetration of thin liquid, never reaching the level of the vocal cords and clearing with subsequent swallows/cued throat clear, and moderate-severe post swallow vallecular residue, primarily with regular texture solids. Diet Prior to this Study: Thin liquids;Regular Temperature Spikes Noted: No Respiratory Status: Nasal cannula History of Recent Intubation: No Behavior/Cognition: Cooperative;Pleasant mood;Alert Oral Cavity Assessment: Within  Functional Limits Oral Care Completed by SLP: No Oral Cavity - Dentition: Adequate natural dentition Vision: Functional for self-feeding Self-Feeding Abilities: Able to feed self Patient Positioning: Upright in chair Baseline Vocal Quality: Normal Volitional Cough: Strong Volitional Swallow: Able to elicit    Oral/Motor/Sensory Function Overall Oral Motor/Sensory Function: Mild impairment Facial ROM: Reduced left Facial Symmetry: Abnormal symmetry left Facial Strength: Reduced left Lingual ROM: Within Functional Limits Lingual Symmetry: Within Functional Limits Lingual Strength: Within Functional Limits Lingual Sensation: Within Functional Limits Velum: Within Functional Limits Mandible: Within Functional Limits   Ice Chips     Thin Liquid Thin Liquid: Within functional limits Presentation: Straw;Self Fed    Nectar Thick     Honey Thick     Puree Puree: Not tested   Solid     Solid: Not tested      Sonia Baller, MA, CCC-SLP Speech Therapy

## 2022-02-24 NOTE — Progress Notes (Signed)
PROGRESS NOTE  [name] OMB:[id number] DOB: [birth year] DOA: 02/21/2022 PCP: Crist Infante, MD   LOS: 2 days   Brief Narrative / Interim history: 71 year old with history of HTN, insulin-dependent DM 2, OSA on CPAP, and depression, anxiety, cirrhosis, pancytopenia comes to the ED with worsening shortness of breath.  She was diagnosed of pneumonia and started on antibiotics about a week ago but due to worsening of shortness of breath she came to the ER.  In the ED CT chest was negative for PE but concerning for diffuse bilateral airspace opacity.  COVID/flu were negative.  BNP 530.  She was started on IV vancomycin, cefepime.  MRSA was negative therefore vancomycin was discontinued.  Over the last 48 hours patient's oxygen requirement is increased.  Respiratory panel is negative   Subjective / 24h Interval events: Feels well this morning, but that is because she is on oxygen.  Has not been up and walking at all.  Assesement and Plan: Principal Problem:   Pneumonia Active Problems:   Insulin dependent type 2 diabetes mellitus (HCC)   Acute respiratory failure with hypoxia (HCC)   Stage 3b chronic kidney disease (CKD) (HCC)   Depression with anxiety   Leukopenia  Principal problem Acute hypoxic respiratory failure due to multifocal pneumonia-patient was profoundly hypoxic, tachypneic on admission, currently on heated high flow at 40% FiO2 and 25 L.  She appears comfortable with sats in the upper 90s, try to wean off as tolerated.  She had wheezing and was started on steroids, continue along with nebulizers, antibiotics with cefepime and azithromycin. -Received Lasix x 2, hold due to slight creatinine elevation  Active problems Chronic diastolic GTX-6I echo done 12/4 shows LVEF 68-03%, grade 1 diastolic dysfunction.  Does not look overtly fluid overloaded.  Received Lasix x 2, hold further diuresis  CKD 3A-baseline creatinine ranging between 1.0-1.4, currently at 1.5 likely due to  Lasix.  Iron deficiency anemia-mild, monitored by hematology as an outpatient  Hypertension -Home meds on hold, resumed this morning   CKD IIIb  -Around baseline currently  Hyperthyroidism-on PTU   Depression, anxiety - Continue Prozac, Seroquel, and as-needed Klonopin   Insulin-dependent DM with steroid-induced hyperglycemia -A1c 5.7  CBG (last 3)  Recent Labs    02/23/22 1649 02/23/22 2125 02/24/22 0609  GLUCAP 101* 244* 218*   Scheduled Meds:  budesonide (PULMICORT) nebulizer solution  0.5 mg Nebulization BID   enoxaparin (LOVENOX) injection  50 mg Subcutaneous Q24H   FLUoxetine  60 mg Oral QHS   insulin aspart  0-5 Units Subcutaneous QHS   insulin aspart  0-6 Units Subcutaneous TID WC   insulin detemir  25 Units Subcutaneous BID   ipratropium-albuterol  3 mL Nebulization Q6H   methylPREDNISolone (SOLU-MEDROL) injection  40 mg Intravenous Q12H   oxybutynin  10 mg Oral Daily   pantoprazole  40 mg Oral Daily   propylthiouracil  50 mg Oral Daily   QUEtiapine  50 mg Oral QHS   rOPINIRole  2 mg Oral QHS   sodium chloride flush  3 mL Intravenous Q12H   Continuous Infusions:  sodium chloride 10 mL/hr at 02/24/22 0000   azithromycin 500 mg (02/24/22 0244)   ceFEPime (MAXIPIME) IV 2 g (02/24/22 0914)   PRN Meds:.sodium chloride, acetaminophen, clonazePAM, gabapentin, guaiFENesin, hydrALAZINE, ipratropium-albuterol, metoprolol tartrate, ondansetron **OR** ondansetron (ZOFRAN) IV, oxyCODONE, senna-docusate  Current Outpatient Medications  Medication Instructions   acetaminophen (TYLENOL) 1,300 mg, Oral, Every 8 hours PRN   amLODipine (NORVASC) 5 mg, Oral, Daily at  bedtime   BD INSULIN SYRINGE U/F 31G X 5/16" 0.5 ML MISC USE 5 TIMES A DAY LEVEMIR AND NOVOLOG   BD PEN NEEDLE MICRO U/F 32G X 6 MM MISC 32 pens., Subcutaneous, Daily   clonazePAM (KLONOPIN) 0.5 mg, Oral, See admin instructions, Take 0.5 mg at bedtime, may take an additional 0.5 mg tablet twice daily as needed for  anxiety    Dialyvite Vitamin D 5000 5,000 Units, Oral, Daily   diclofenac (VOLTAREN) 75 mg, Oral, 2 times daily   doxazosin (CARDURA) 2 mg, Oral, Daily   fenofibrate 54 mg, Oral, Daily with supper   FLUoxetine (PROZAC) 60 mg, Oral, Daily at bedtime   FREESTYLE TEST STRIPS test strip USE STRIPS TO TEST BLOOD GLUCOSE BID   gabapentin (NEURONTIN) 300 mg, Oral, 3 times daily   hyoscyamine (LEVSIN SL) 0.125 mg, Sublingual, Every 6 hours PRN   insulin aspart (NOVOLOG) 100 UNIT/ML FlexPen Sliding scale dose:<BR>CBG < 70: implement hypoglycemia protocol <BR>CBG 70 - 120: 0 units <BR>CBG 121 - 150: 0 units <BR>CBG 151 - 200: 0 units <BR>CBG 201 - 250: 2 units <BR>CBG 251 - 300: 3 units <BR>CBG 301 - 350: 4 units <BR>CBG 351 - 400: 5 units   insulin detemir (LEVEMIR) 25 Units, Subcutaneous, 2 times daily   Lancets Misc. (UNISTIK 3 COMFORT) MISC USE LANCETS TO CHECK BLOOD GLUCOSE BID   metoprolol tartrate (LOPRESSOR) 50 mg, Oral, 3 times daily   omeprazole (PRILOSEC) 20 mg, Oral, Daily with supper   oxybutynin (DITROPAN-XL) 10 mg, Oral, Daily   Ozempic (1 MG/DOSE) 1 mg, Subcutaneous, Weekly, Thursdays   propylthiouracil (PTU) 50 mg, Oral, Daily   QUEtiapine (SEROQUEL) 50 mg, Oral, Daily at bedtime   rOPINIRole (REQUIP) 2 mg, Oral, See admin instructions, Take 2 mg at bedtime, may take a second 2 mg dose as needed for restless legs    SYMBICORT 160-4.5 MCG/ACT inhaler 2 puffs, Inhalation, 2 times daily   tiZANidine (ZANAFLEX) 4 mg, Oral, 2 times daily PRN   VENTOLIN HFA 108 (90 Base) MCG/ACT inhaler 2 puffs, Inhalation, Every 4 hours PRN    Diet Orders (From admission, onward)     Start     Ordered   02/22/22 0258  Diet Carb Modified Fluid consistency: Thin; Room service appropriate? Yes  Diet effective now       Question Answer Comment  Diet-HS Snack? Nothing   Calorie Level Medium 1600-2000   Fluid consistency: Thin   Room service appropriate? Yes      02/22/22 0300            DVT  prophylaxis:    Lab Results  Component Value Date   PLT 169 02/24/2022      Code Status: Full Code  Family Communication: no family at bedside  Status is: Inpatient  Remains inpatient appropriate because: severity of illness  Level of care: Progressive  Consultants:  none  Objective: Vitals:   02/24/22 0350 02/24/22 0732 02/24/22 0817 02/24/22 0823  BP: (!) 118/57 (!) 174/83    Pulse: 76 72 73   Resp: '20 17 18   '$ Temp: 97.7 F (36.5 C)     TempSrc: Oral     SpO2: 93% 97% 98% 99%  Weight: 96.8 kg     Height:        Intake/Output Summary (Last 24 hours) at 02/24/2022 1052 Last data filed at 02/24/2022 1048 Gross per 24 hour  Intake 123.97 ml  Output 1500 ml  Net -1376.03 ml   Wt  Readings from Last 3 Encounters:  02/24/22 96.8 kg  02/15/22 102.2 kg  02/02/22 104.5 kg    Examination:  Constitutional: NAD Eyes: no scleral icterus ENMT: Mucous membranes are moist.  Neck: normal, supple Respiratory: Faint end expiratory wheezing, bibasilar rhonchi Cardiovascular: Regular rate and rhythm, no murmurs / rubs / gallops. No LE edema.  Abdomen: non distended, no tenderness. Bowel sounds positive.  Musculoskeletal: no clubbing / cyanosis.    Data Reviewed: I have independently reviewed following labs and imaging studies   CBC Recent Labs  Lab 02/21/22 1715 02/22/22 0306 02/23/22 0425 02/24/22 0016  WBC 3.9* 2.3* 2.9* 2.1*  HGB 10.6* 9.1* 9.5* 10.4*  HCT 34.0* 27.4* 29.9* 31.2*  PLT 183 162 170 169  MCV 87.6 85.4 86.9 83.4  MCH 27.3 28.3 27.6 27.8  MCHC 31.2 33.2 31.8 33.3  RDW 14.6 14.6 14.6 14.1  LYMPHSABS 0.3*  --   --   --   MONOABS 0.2  --   --   --   EOSABS 0.0  --   --   --   BASOSABS 0.0  --   --   --     Recent Labs  Lab 02/21/22 1706 02/21/22 1715 02/22/22 0306 02/23/22 0425 02/24/22 0016  NA  --  142 139 144 135  K  --  3.4* 3.8 3.5 3.8  CL  --  105 108 102 95*  CO2  --  '26 24 28 30  '$ GLUCOSE  --  89 96 70 285*  BUN  --  '22 19 16  '$ 24*  CREATININE  --  1.39* 1.25* 1.30* 1.51*  CALCIUM  --  9.1 8.2* 9.3 8.9  AST  --  18  --   --   --   ALT  --  12  --   --   --   ALKPHOS  --  76  --   --   --   BILITOT  --  0.5  --   --   --   ALBUMIN  --  2.5*  --   --   --   MG  --   --  1.8 1.9 1.8  PROCALCITON  --   --  <0.10 <0.10 <0.10  LATICACIDVEN  --  1.1  --   --   --   HGBA1C  --   --  5.7*  --   --   BNP 530.2*  --   --   --   --     ------------------------------------------------------------------------------------------------------------------ No results for input(s): "CHOL", "HDL", "LDLCALC", "TRIG", "CHOLHDL", "LDLDIRECT" in the last 72 hours.  Lab Results  Component Value Date   HGBA1C 5.7 (H) 02/22/2022   ------------------------------------------------------------------------------------------------------------------ No results for input(s): "TSH", "T4TOTAL", "T3FREE", "THYROIDAB" in the last 72 hours.  Invalid input(s): "FREET3"  Cardiac Enzymes No results for input(s): "CKMB", "TROPONINI", "MYOGLOBIN" in the last 168 hours.  Invalid input(s): "CK" ------------------------------------------------------------------------------------------------------------------    Component Value Date/Time   BNP 530.2 (H) 02/21/2022 1706    CBG: Recent Labs  Lab 02/23/22 0732 02/23/22 1203 02/23/22 1649 02/23/22 2125 02/24/22 0609  GLUCAP 69* 133* 101* 244* 218*    Recent Results (from the past 240 hour(s))  Resp Panel by RT-PCR (Flu A&B, Covid) Anterior Nasal Swab     Status: None   Collection Time: 02/21/22  5:15 PM   Specimen: Anterior Nasal Swab  Result Value Ref Range Status   SARS Coronavirus 2 by RT PCR NEGATIVE NEGATIVE Final  Comment: (NOTE) SARS-CoV-2 target nucleic acids are NOT DETECTED.  The SARS-CoV-2 RNA is generally detectable in upper respiratory specimens during the acute phase of infection. The lowest concentration of SARS-CoV-2 viral copies this assay can detect is 138  copies/mL. A negative result does not preclude SARS-Cov-2 infection and should not be used as the sole basis for treatment or other patient management decisions. A negative result may occur with  improper specimen collection/handling, submission of specimen other than nasopharyngeal swab, presence of viral mutation(s) within the areas targeted by this assay, and inadequate number of viral copies(<138 copies/mL). A negative result must be combined with clinical observations, patient history, and epidemiological information. The expected result is Negative.  Fact Sheet for Patients:  EntrepreneurPulse.com.au  Fact Sheet for Healthcare Providers:  IncredibleEmployment.be  This test is no t yet approved or cleared by the [place] FDA and  has been authorized for detection and/or diagnosis of SARS-CoV-2 by FDA under an Emergency Use Authorization (EUA). This EUA will remain  in effect (meaning this test can be used) for the duration of the COVID-19 declaration under Section 564(b)(1) of the Act, 21 U.S.C.section 360bbb-3(b)(1), unless the authorization is terminated  or revoked sooner.       Influenza A by PCR NEGATIVE NEGATIVE Final   Influenza B by PCR NEGATIVE NEGATIVE Final    Comment: (NOTE) The Xpert Xpress SARS-CoV-2/FLU/RSV plus assay is intended as an aid in the diagnosis of influenza from Nasopharyngeal swab specimens and should not be used as a sole basis for treatment. Nasal washings and aspirates are unacceptable for Xpert Xpress SARS-CoV-2/FLU/RSV testing.  Fact Sheet for Patients: EntrepreneurPulse.com.au  Fact Sheet for Healthcare Providers: IncredibleEmployment.be  This test is not yet approved or cleared by the [place] FDA and has been authorized for detection and/or diagnosis of SARS-CoV-2 by FDA under an Emergency Use Authorization (EUA). This EUA will remain in effect (meaning  this test can be used) for the duration of the COVID-19 declaration under Section 564(b)(1) of the Act, 21 U.S.C. section 360bbb-3(b)(1), unless the authorization is terminated or revoked.  Performed at [facility], [place] [address], [place], [place] [address]   MRSA Next Gen by PCR, Nasal     Status: None   Collection Time: 02/21/22 11:46 PM   Specimen: Nasal Mucosa; Nasal Swab  Result Value Ref Range Status   MRSA by PCR Next Gen NOT DETECTED NOT DETECTED Final    Comment: (NOTE) The GeneXpert MRSA Assay (FDA approved for NASAL specimens only), is one component of a comprehensive MRSA colonization surveillance program. It is not intended to diagnose MRSA infection nor to guide or monitor treatment for MRSA infections. Test performance is not FDA approved in patients less than 82 years old. Performed at [facility], [place] [address], [place], [place] [address]   Expectorated Sputum Assessment w Gram Stain, Rflx to Resp Cult     Status: None   Collection Time: 02/22/22  3:00 AM   Specimen: Expectorated Sputum  Result Value Ref Range Status   Specimen Description EXPECTORATED SPUTUM  Final   Special Requests NONE  Final   Sputum evaluation   Final    THIS SPECIMEN IS ACCEPTABLE FOR SPUTUM CULTURE Performed at [facility], [place] [address] [place], [place] [address]    Report Status 02/22/2022 FINAL  Final  Culture, Respiratory w Gram Stain     Status: None (Preliminary result)   Collection Time: 02/22/22  3:00 AM  Result  Value Ref Range Status   Specimen Description EXPECTORATED SPUTUM  Final   Special Requests NONE Reflexed from [id number]  Final   Gram Stain   Final    RARE WBC PRESENT, PREDOMINANTLY PMN RARE BUDDING YEAST SEEN    Culture   Final    NO GROWTH < 12 HOURS Performed at [place] [address] [place], [place] [address]    Report Status PENDING  Incomplete  Respiratory (~20 pathogens) panel by PCR     Status: None   Collection Time:  02/22/22 10:34 AM   Specimen: Nasopharyngeal Swab; Respiratory  Result Value Ref Range Status   Adenovirus NOT DETECTED NOT DETECTED Final   Coronavirus 229E NOT DETECTED NOT DETECTED Final    Comment: (NOTE) The Coronavirus on the Respiratory Panel, DOES NOT test for the novel  Coronavirus (2019 nCoV)    Coronavirus HKU1 NOT DETECTED NOT DETECTED Final   Coronavirus NL63 NOT DETECTED NOT DETECTED Final   Coronavirus OC43 NOT DETECTED NOT DETECTED Final   Metapneumovirus NOT DETECTED NOT DETECTED Final   Rhinovirus / Enterovirus NOT DETECTED NOT DETECTED Final   Influenza A NOT DETECTED NOT DETECTED Final   Influenza B NOT DETECTED NOT DETECTED Final   Parainfluenza Virus 1 NOT DETECTED NOT DETECTED Final   Parainfluenza Virus 2 NOT DETECTED NOT DETECTED Final   Parainfluenza Virus 3 NOT DETECTED NOT DETECTED Final   Parainfluenza Virus 4 NOT DETECTED NOT DETECTED Final   Respiratory Syncytial Virus NOT DETECTED NOT DETECTED Final   Bordetella pertussis NOT DETECTED NOT DETECTED Final   Bordetella Parapertussis NOT DETECTED NOT DETECTED Final   Chlamydophila pneumoniae NOT DETECTED NOT DETECTED Final   Mycoplasma pneumoniae NOT DETECTED NOT DETECTED Final    Comment: Performed at [facility], [place]. [address] [place], [place] [address]     Radiology Studies: No results found.   Marzetta Board, MD, PhD [facility]  Between 7 am - 7 pm I am available, please contact me via Amion (for emergencies) or Securechat (non urgent messages)  Between 7 pm - 7 am I am not available, please contact night coverage MD/APP via Amion

## 2022-02-25 ENCOUNTER — Inpatient Hospital Stay (HOSPITAL_COMMUNITY): Payer: Medicare Other

## 2022-02-25 DIAGNOSIS — J189 Pneumonia, unspecified organism: Secondary | ICD-10-CM | POA: Diagnosis not present

## 2022-02-25 LAB — COMPREHENSIVE METABOLIC PANEL
ALT: 12 U/L (ref 0–44)
AST: 15 U/L (ref 15–41)
Albumin: 2.5 g/dL — ABNORMAL LOW (ref 3.5–5.0)
Alkaline Phosphatase: 69 U/L (ref 38–126)
Anion gap: 11 (ref 5–15)
BUN: 31 mg/dL — ABNORMAL HIGH (ref 8–23)
CO2: 31 mmol/L (ref 22–32)
Calcium: 9.5 mg/dL (ref 8.9–10.3)
Chloride: 95 mmol/L — ABNORMAL LOW (ref 98–111)
Creatinine, Ser: 1.32 mg/dL — ABNORMAL HIGH (ref 0.44–1.00)
GFR, Estimated: 44 mL/min — ABNORMAL LOW (ref >60)
Glucose, Bld: 218 mg/dL — ABNORMAL HIGH (ref 70–99)
Potassium: 4.1 mmol/L (ref 3.5–5.1)
Sodium: 137 mmol/L (ref 135–145)
Total Bilirubin: 0.4 mg/dL (ref 0.3–1.2)
Total Protein: 6.7 g/dL (ref 6.5–8.1)

## 2022-02-25 LAB — GLUCOSE, CAPILLARY
Glucose-Capillary: 169 mg/dL — ABNORMAL HIGH (ref 70–99)
Glucose-Capillary: 174 mg/dL — ABNORMAL HIGH (ref 70–99)
Glucose-Capillary: 192 mg/dL — ABNORMAL HIGH (ref 70–99)
Glucose-Capillary: 90 mg/dL (ref 70–99)

## 2022-02-25 LAB — CULTURE, RESPIRATORY W GRAM STAIN

## 2022-02-25 LAB — CBC
HCT: 31.9 % — ABNORMAL LOW (ref 36.0–46.0)
Hemoglobin: 10.5 g/dL — ABNORMAL LOW (ref 12.0–15.0)
MCH: 27.3 pg (ref 26.0–34.0)
MCHC: 32.9 g/dL (ref 30.0–36.0)
MCV: 82.9 fL (ref 80.0–100.0)
Platelets: 194 10*3/uL (ref 150–400)
RBC: 3.85 MIL/uL — ABNORMAL LOW (ref 3.87–5.11)
RDW: 13.6 % (ref 11.5–15.5)
WBC: 3.6 10*3/uL — ABNORMAL LOW (ref 4.0–10.5)
nRBC: 0 % (ref 0.0–0.2)

## 2022-02-25 LAB — MAGNESIUM: Magnesium: 2 mg/dL (ref 1.7–2.4)

## 2022-02-25 MED ORDER — IPRATROPIUM-ALBUTEROL 0.5-2.5 (3) MG/3ML IN SOLN
3.0000 mL | Freq: Two times a day (BID) | RESPIRATORY_TRACT | Status: DC
  Administered 2022-02-25 – 2022-02-27 (×4): 3 mL via RESPIRATORY_TRACT
  Filled 2022-02-25 (×4): qty 3

## 2022-02-25 MED ORDER — PREDNISONE 20 MG PO TABS
40.0000 mg | ORAL_TABLET | Freq: Every day | ORAL | Status: DC
  Administered 2022-02-26 – 2022-02-27 (×2): 40 mg via ORAL
  Filled 2022-02-25 (×3): qty 2

## 2022-02-25 NOTE — Progress Notes (Signed)
Mobility Specialist Progress Note    02/25/22 0859  Mobility  Activity Ambulated with assistance in hallway  Level of Assistance Contact guard assist, steadying assist  Assistive Device Front wheel walker  Distance Ambulated (ft) 270 ft  Activity Response Tolerated well  Mobility Referral Yes  $Mobility charge 1 Mobility   Pre-Mobility: 66 HR, 89% SpO2 on RA During Mobility: 73 HR, 90-93% SpO2 on 3LO2 Post-Mobility: 67 HR, 94% SpO2 on 1LO2  Pt received in chair and agreeable. No complaints. SpO2 as low as 84% with activity on RA. Required 3LO2 to maintain SpO2 90-93%. Returned to chair with call bell in reach.   Hildred Alamin Mobility Specialist  Please Psychologist, sport and exercise or Rehab Office at [phone]

## 2022-02-25 NOTE — Evaluation (Signed)
Occupational Therapy Evaluation Patient Details Name: [name] MRN: [id number] DOB: 1951/02/28 Today's Date: 02/25/2022   History of Present Illness Pt is a 71 y.o. F who presents with worsening shortness of breath. Admitted with acute hypoxic respiratory failure due to multifocal PNA. Significant PMH: DM2, OSA on CPAP, depression, anxiety, cirrhosis, pancytopenia.   Clinical Impression   Pt was functioning modified independently in ADLs and light meal prep prior to admission. She sits to shower. Pt lives with her supportive husband. She presents with generalized weakness, decreased activity tolerance and impaired standing balance. She was completing her bath upon OT's arrival and demonstrated need for assist to wash her back, buttocks and LEs. Pt has a wand she uses at home for pericare and reports no difficulty with LB bathing when in the shower. Pt noted to desat to 88% on RA with bathing and dressing. Rebounded quickly with 2L 02 to mid 90s. Encouraged use of IS. Do not anticipate pt will need post acute OT. Will follow acutely.      Recommendations for follow up therapy are one component of a multi-disciplinary discharge planning process, led by the attending physician.  Recommendations may be updated based on patient status, additional functional criteria and insurance authorization.   Follow Up Recommendations  No OT follow up     Assistance Recommended at Discharge Intermittent Supervision/Assistance  Patient can return home with the following A little help with bathing/dressing/bathroom;Assistance with cooking/housework;Assist for transportation;Help with stairs or ramp for entrance    Functional Status Assessment  Patient has had a recent decline in their functional status and demonstrates the ability to make significant improvements in function in a reasonable and predictable amount of time.  Equipment Recommendations  None recommended by OT    Recommendations for Other  Services       Precautions / Restrictions Precautions Precautions: Fall      Mobility Bed Mobility               General bed mobility comments: received in chair    Transfers Overall transfer level: Needs assistance Equipment used: Rolling walker (2 wheels) Transfers: Sit to/from Stand Sit to Stand: Min guard                  Balance Overall balance assessment: Mild deficits observed, not formally tested                                         ADL either performed or assessed with clinical judgement   ADL Overall ADL's : Needs assistance/impaired Eating/Feeding: Independent   Grooming: Sitting;Set up;Brushing hair;Wash/dry hands;Wash/dry face   Upper Body Bathing: Minimal assistance;Sitting Upper Body Bathing Details (indicate cue type and reason): assisted for back Lower Body Bathing: Minimal assistance;Sit to/from stand   Upper Body Dressing : Set up;Sitting   Lower Body Dressing: Moderate assistance;Sit to/from stand   Toilet Transfer: Designer, fashion/clothing and Hygiene: Moderate assistance;Sit to/from stand Toileting - Clothing Manipulation Details (indicate cue type and reason): assist for posterior pericare, pt reports having a wand at home she uses to reach her backside     Functional mobility during ADLs: Min guard;Rolling walker (2 wheels)       Vision Baseline Vision/History: 1 Wears glasses Ability to See in Adequate Light: 0 Adequate Patient Visual Report: No change from baseline       Perception  Praxis      Pertinent Vitals/Pain Pain Assessment Pain Assessment: No/denies pain     Hand Dominance Right   Extremity/Trunk Assessment Upper Extremity Assessment Upper Extremity Assessment: RUE deficits/detail RUE Deficits / Details: longstanding limitation from previous dislocation   Lower Extremity Assessment Lower Extremity Assessment: Defer to PT evaluation   Cervical / Trunk  Assessment Cervical / Trunk Assessment: Other exceptions (obesity)   Communication Communication Communication: No difficulties   Cognition Arousal/Alertness: Awake/alert Behavior During Therapy: WFL for tasks assessed/performed Overall Cognitive Status: Within Functional Limits for tasks assessed                                       General Comments       Exercises     Shoulder Instructions      Home Living Family/patient expects to be discharged to:: Private residence Living Arrangements: Spouse/significant other Available Help at Discharge: Family Type of Home: House Home Access: Stairs to enter Technical brewer of Steps: 2   Home Layout: One level     Bathroom Shower/Tub: Occupational psychologist: Handicapped height     Home Equipment: Advice worker (2 wheels);BSC/3in1 (BSC over toilet, lift chair)          Prior Functioning/Environment Prior Level of Function : Needs assist             Mobility Comments: uses walker for ambulation to descend steps going into home ADLs Comments: independent ADL's, sits to shower, has housekeeper, can prepare simple meals        OT Problem List: Decreased strength;Decreased activity tolerance;Impaired balance (sitting and/or standing);Cardiopulmonary status limiting activity      OT Treatment/Interventions: Self-care/ADL training;Energy conservation;DME and/or AE instruction;Therapeutic activities;Patient/family education;Balance training    OT Goals(Current goals can be found in the care plan section) Acute Rehab OT Goals OT Goal Formulation: With patient Time For Goal Achievement: 03/11/22 Potential to Achieve Goals: Good ADL Goals Pt Will Perform Grooming: with supervision;standing Pt Will Transfer to Toilet: with supervision;ambulating;bedside commode Pt Will Perform Toileting - Clothing Manipulation and hygiene: with supervision;sit to/from stand Additional ADL Goal  #1: Pt will generalize energy conservation strategies in ADLs and mobility.  OT Frequency: Min 2X/week    Co-evaluation              AM-PAC OT "6 Clicks" Daily Activity     Outcome Measure Help from another person eating meals?: None Help from another person taking care of personal grooming?: A Little Help from another person toileting, which includes using toliet, bedpan, or urinal?: A Lot Help from another person bathing (including washing, rinsing, drying)?: A Little Help from another person to put on and taking off regular upper body clothing?: A Little Help from another person to put on and taking off regular lower body clothing?: A Lot 6 Click Score: 17   End of Session Equipment Utilized During Treatment: Gait belt;Rolling walker (2 wheels);Oxygen (1L) Nurse Communication: Other (comment) (aware of 02 needs)  Activity Tolerance: No increased pain Patient left: in chair;with call bell/phone within reach  OT Visit Diagnosis: Unsteadiness on feet (R26.81);Muscle weakness (generalized) (M62.81);Other (comment) (decreased activity tolerance)                Time: 8144-8185 OT Time Calculation (min): 32 min Charges:  OT General Charges $OT Visit: 1 Visit OT Evaluation $OT Eval Moderate Complexity: 1 Mod OT Treatments $Self  Care/Home Management : 8-22 mins  Cleta Alberts, OTR/L Acute Rehabilitation Services Office: [phone]   Malka So 02/25/2022, 2:02 PM

## 2022-02-25 NOTE — Progress Notes (Signed)
PROGRESS NOTE  [name] ZRA:[id number] DOB: 05-27-1950 DOA: 02/21/2022 PCP: Crist Infante, MD   LOS: 3 days   Brief Narrative / Interim history: 71 year old with history of HTN, insulin-dependent DM 2, OSA on CPAP, and depression, anxiety, cirrhosis, pancytopenia comes to the ED with worsening shortness of breath.  She was diagnosed of pneumonia and started on antibiotics about a week ago but due to worsening of shortness of breath she came to the ER.  In the ED CT chest was negative for PE but concerning for diffuse bilateral airspace opacity.  COVID/flu were negative.  BNP 530.  She was started on IV vancomycin, cefepime.  MRSA was negative therefore vancomycin was discontinued.  Over the last 48 hours patient's oxygen requirement is increased.  Respiratory panel is negative   Subjective / 24h Interval events: She is feeling a whole lot better today.  Only on 2 L nasal cannula at rest.  She has more energy.  Assesement and Plan: Principal Problem:   Pneumonia Active Problems:   Insulin dependent type 2 diabetes mellitus (HCC)   Acute respiratory failure with hypoxia (HCC)   Stage 3b chronic kidney disease (CKD) (HCC)   Depression with anxiety   Leukopenia  Principal problem Acute hypoxic respiratory failure due to multifocal pneumonia-patient was profoundly hypoxic, tachypneic on admission requiring heated high flow, currently has been weaned off to just couple liters..  She appears comfortable with sats in the upper 90s, try to wean off as tolerated.  She had wheezing and was started on steroids, continue along with nebulizers, antibiotics with cefepime and azithromycin. -Doing better today.  Mobilize more  Active problems Chronic diastolic LKT-6Y echo done 12/4 shows LVEF 56-38%, grade 1 diastolic dysfunction.  Does not look overtly fluid overloaded.  Received Lasix x 2, hold further diuresis  CKD 3A-baseline creatinine ranging between 1.0-1.4, currently creatinine is at  baseline.  Iron deficiency anemia-mild, monitored by hematology as an outpatient  Hypertension -continue metoprolol   Hyperthyroidism-on PTU   Depression, anxiety - Continue Prozac, Seroquel, and as-needed Klonopin   Insulin-dependent DM with steroid-induced hyperglycemia -A1c 5.7.  CBGs on the high side due to steroids.  Decrease steroids dose today  CBG (last 3)  Recent Labs    02/24/22 1722 02/24/22 2116 02/25/22 0616  GLUCAP 266* 263* 169*    Scheduled Meds:  budesonide (PULMICORT) nebulizer solution  0.5 mg Nebulization BID   enoxaparin (LOVENOX) injection  50 mg Subcutaneous Q24H   fenofibrate  54 mg Oral Q supper   FLUoxetine  60 mg Oral QHS   insulin aspart  0-5 Units Subcutaneous QHS   insulin aspart  0-6 Units Subcutaneous TID WC   insulin detemir  25 Units Subcutaneous BID   ipratropium-albuterol  3 mL Nebulization TID   methylPREDNISolone (SOLU-MEDROL) injection  40 mg Intravenous Q12H   metoprolol tartrate  50 mg Oral TID   oxybutynin  10 mg Oral Daily   pantoprazole  40 mg Oral Daily   propylthiouracil  50 mg Oral Daily   QUEtiapine  50 mg Oral QHS   rOPINIRole  2 mg Oral QHS   sodium chloride flush  3 mL Intravenous Q12H   Continuous Infusions:  sodium chloride 10 mL/hr at 02/24/22 0000   azithromycin 500 mg (02/25/22 0331)   ceFEPime (MAXIPIME) IV 2 g (02/25/22 0958)   PRN Meds:.sodium chloride, acetaminophen, clonazePAM, gabapentin, guaiFENesin, hydrALAZINE, ipratropium-albuterol, metoprolol tartrate, ondansetron **OR** ondansetron (ZOFRAN) IV, oxyCODONE, senna-docusate  Current Outpatient Medications  Medication Instructions  acetaminophen (TYLENOL) 1,300 mg, Oral, Every 8 hours PRN   amLODipine (NORVASC) 5 mg, Oral, Daily at bedtime   BD INSULIN SYRINGE U/F 31G X 5/16" 0.5 ML MISC USE 5 TIMES A DAY LEVEMIR AND NOVOLOG   BD PEN NEEDLE MICRO U/F 32G X 6 MM MISC 32 pens., Subcutaneous, Daily   clonazePAM (KLONOPIN) 0.5 mg, Oral, See admin  instructions, Take 0.5 mg at bedtime, may take an additional 0.5 mg tablet twice daily as needed for anxiety    Dialyvite Vitamin D 5000 5,000 Units, Oral, Daily   diclofenac (VOLTAREN) 75 mg, Oral, 2 times daily   doxazosin (CARDURA) 2 mg, Oral, Daily   fenofibrate 54 mg, Oral, Daily with supper   FLUoxetine (PROZAC) 60 mg, Oral, Daily at bedtime   FREESTYLE TEST STRIPS test strip USE STRIPS TO TEST BLOOD GLUCOSE BID   gabapentin (NEURONTIN) 300 mg, Oral, 3 times daily   hyoscyamine (LEVSIN SL) 0.125 mg, Sublingual, Every 6 hours PRN   insulin aspart (NOVOLOG) 100 UNIT/ML FlexPen Sliding scale dose:<BR>CBG < 70: implement hypoglycemia protocol <BR>CBG 70 - 120: 0 units <BR>CBG 121 - 150: 0 units <BR>CBG 151 - 200: 0 units <BR>CBG 201 - 250: 2 units <BR>CBG 251 - 300: 3 units <BR>CBG 301 - 350: 4 units <BR>CBG 351 - 400: 5 units   insulin detemir (LEVEMIR) 25 Units, Subcutaneous, 2 times daily   Lancets Misc. (UNISTIK 3 COMFORT) MISC USE LANCETS TO CHECK BLOOD GLUCOSE BID   metoprolol tartrate (LOPRESSOR) 50 mg, Oral, 3 times daily   omeprazole (PRILOSEC) 20 mg, Oral, Daily with supper   oxybutynin (DITROPAN-XL) 10 mg, Oral, Daily   Ozempic (1 MG/DOSE) 1 mg, Subcutaneous, Weekly, Thursdays   propylthiouracil (PTU) 50 mg, Oral, Daily   QUEtiapine (SEROQUEL) 50 mg, Oral, Daily at bedtime   rOPINIRole (REQUIP) 2 mg, Oral, See admin instructions, Take 2 mg at bedtime, may take a second 2 mg dose as needed for restless legs    SYMBICORT 160-4.5 MCG/ACT inhaler 2 puffs, Inhalation, 2 times daily   tiZANidine (ZANAFLEX) 4 mg, Oral, 2 times daily PRN   VENTOLIN HFA 108 (90 Base) MCG/ACT inhaler 2 puffs, Inhalation, Every 4 hours PRN    Diet Orders (From admission, onward)     Start     Ordered   02/22/22 0258  Diet Carb Modified Fluid consistency: Thin; Room service appropriate? Yes  Diet effective now       Question Answer Comment  Diet-HS Snack? Nothing   Calorie Level Medium 1600-2000    Fluid consistency: Thin   Room service appropriate? Yes      02/22/22 0300            DVT prophylaxis:    Lab Results  Component Value Date   PLT 194 02/25/2022      Code Status: Full Code  Family Communication: no family at bedside  Status is: Inpatient  Remains inpatient appropriate because: severity of illness  Level of care: Progressive  Consultants:  none  Objective: Vitals:   02/25/22 0826 02/25/22 0903 02/25/22 0908 02/25/22 0957  BP:      Pulse: 64 63  64  Resp: '18 18  14  '$ Temp:      TempSrc:      SpO2: 95% 94% 94% 95%  Weight:      Height:        Intake/Output Summary (Last 24 hours) at 02/25/2022 1103 Last data filed at 02/25/2022 0824 Gross per 24 hour  Intake 678  ml  Output --  Net 678 ml    Wt Readings from Last 3 Encounters:  02/25/22 96.6 kg  02/15/22 102.2 kg  02/02/22 104.5 kg    Examination:  Constitutional: NAD Eyes: lids and conjunctivae normal, no scleral icterus ENMT: mmm Neck: normal, supple Respiratory: No significant wheezing, no crackles, faint rhonchi at the bases Cardiovascular: Regular rate and rhythm, no murmurs / rubs / gallops. No LE edema. Abdomen: soft, no distention, no tenderness. Bowel sounds positive.  Skin: no rashes  Data Reviewed: I have independently reviewed following labs and imaging studies   CBC Recent Labs  Lab 02/21/22 1715 02/22/22 0306 02/23/22 0425 02/24/22 0016 02/25/22 0033  WBC 3.9* 2.3* 2.9* 2.1* 3.6*  HGB 10.6* 9.1* 9.5* 10.4* 10.5*  HCT 34.0* 27.4* 29.9* 31.2* 31.9*  PLT 183 162 170 169 194  MCV 87.6 85.4 86.9 83.4 82.9  MCH 27.3 28.3 27.6 27.8 27.3  MCHC 31.2 33.2 31.8 33.3 32.9  RDW 14.6 14.6 14.6 14.1 13.6  LYMPHSABS 0.3*  --   --   --   --   MONOABS 0.2  --   --   --   --   EOSABS 0.0  --   --   --   --   BASOSABS 0.0  --   --   --   --      Recent Labs  Lab 02/21/22 1706 02/21/22 1715 02/22/22 0306 02/23/22 0425 02/24/22 0016 02/25/22 0033  NA  --  142  139 144 135 137  K  --  3.4* 3.8 3.5 3.8 4.1  CL  --  105 108 102 95* 95*  CO2  --  '26 24 28 30 31  '$ GLUCOSE  --  89 96 70 285* 218*  BUN  --  '22 19 16 '$ 24* 31*  CREATININE  --  1.39* 1.25* 1.30* 1.51* 1.32*  CALCIUM  --  9.1 8.2* 9.3 8.9 9.5  AST  --  18  --   --   --  15  ALT  --  12  --   --   --  12  ALKPHOS  --  76  --   --   --  69  BILITOT  --  0.5  --   --   --  0.4  ALBUMIN  --  2.5*  --   --   --  2.5*  MG  --   --  1.8 1.9 1.8 2.0  PROCALCITON  --   --  <0.10 <0.10 <0.10  --   LATICACIDVEN  --  1.1  --   --   --   --   HGBA1C  --   --  5.7*  --   --   --   BNP 530.2*  --   --   --   --   --      ------------------------------------------------------------------------------------------------------------------ No results for input(s): "CHOL", "HDL", "LDLCALC", "TRIG", "CHOLHDL", "LDLDIRECT" in the last 72 hours.  Lab Results  Component Value Date   HGBA1C 5.7 (H) 02/22/2022   ------------------------------------------------------------------------------------------------------------------ No results for input(s): "TSH", "T4TOTAL", "T3FREE", "THYROIDAB" in the last 72 hours.  Invalid input(s): "FREET3"  Cardiac Enzymes No results for input(s): "CKMB", "TROPONINI", "MYOGLOBIN" in the last 168 hours.  Invalid input(s): "CK" ------------------------------------------------------------------------------------------------------------------    Component Value Date/Time   BNP 530.2 (H) 02/21/2022 1706    CBG: Recent Labs  Lab 02/24/22 0609 02/24/22 1112 02/24/22 1722 02/24/22 2116 02/25/22 0616  GLUCAP 218*  273* 266* 263* 169*     Recent Results (from the past 240 hour(s))  Resp Panel by RT-PCR (Flu A&B, Covid) Anterior Nasal Swab     Status: None   Collection Time: 02/21/22  5:15 PM   Specimen: Anterior Nasal Swab  Result Value Ref Range Status   SARS Coronavirus 2 by RT PCR NEGATIVE NEGATIVE Final    Comment: (NOTE) SARS-CoV-2 target nucleic acids are  NOT DETECTED.  The SARS-CoV-2 RNA is generally detectable in upper respiratory specimens during the acute phase of infection. The lowest concentration of SARS-CoV-2 viral copies this assay can detect is 138 copies/mL. A negative result does not preclude SARS-Cov-2 infection and should not be used as the sole basis for treatment or other patient management decisions. A negative result may occur with  improper specimen collection/handling, submission of specimen other than nasopharyngeal swab, presence of viral mutation(s) within the areas targeted by this assay, and inadequate number of viral copies(<138 copies/mL). A negative result must be combined with clinical observations, patient history, and epidemiological information. The expected result is Negative.  Fact Sheet for Patients:  EntrepreneurPulse.com.au  Fact Sheet for Healthcare Providers:  IncredibleEmployment.be  This test is no t yet approved or cleared by the [place] FDA and  has been authorized for detection and/or diagnosis of SARS-CoV-2 by FDA under an Emergency Use Authorization (EUA). This EUA will remain  in effect (meaning this test can be used) for the duration of the COVID-19 declaration under Section 564(b)(1) of the Act, 21 U.S.C.section 360bbb-3(b)(1), unless the authorization is terminated  or revoked sooner.       Influenza A by PCR NEGATIVE NEGATIVE Final   Influenza B by PCR NEGATIVE NEGATIVE Final    Comment: (NOTE) The Xpert Xpress SARS-CoV-2/FLU/RSV plus assay is intended as an aid in the diagnosis of influenza from Nasopharyngeal swab specimens and should not be used as a sole basis for treatment. Nasal washings and aspirates are unacceptable for Xpert Xpress SARS-CoV-2/FLU/RSV testing.  Fact Sheet for Patients: EntrepreneurPulse.com.au  Fact Sheet for Healthcare Providers: IncredibleEmployment.be  This test is not  yet approved or cleared by the [place] FDA and has been authorized for detection and/or diagnosis of SARS-CoV-2 by FDA under an Emergency Use Authorization (EUA). This EUA will remain in effect (meaning this test can be used) for the duration of the COVID-19 declaration under Section 564(b)(1) of the Act, 21 U.S.C. section 360bbb-3(b)(1), unless the authorization is terminated or revoked.  Performed at [facility], [place] [address], [place], [place] [address]   MRSA Next Gen by PCR, Nasal     Status: None   Collection Time: 02/21/22 11:46 PM   Specimen: Nasal Mucosa; Nasal Swab  Result Value Ref Range Status   MRSA by PCR Next Gen NOT DETECTED NOT DETECTED Final    Comment: (NOTE) The GeneXpert MRSA Assay (FDA approved for NASAL specimens only), is one component of a comprehensive MRSA colonization surveillance program. It is not intended to diagnose MRSA infection nor to guide or monitor treatment for MRSA infections. Test performance is not FDA approved in patients less than 83 years old. Performed at [facility], [place] [address] [place], [place] [address]   Expectorated Sputum Assessment w Gram Stain, Rflx to Resp Cult     Status: None   Collection Time: 02/22/22  3:00 AM   Specimen: Expectorated Sputum  Result Value Ref Range Status   Specimen Description EXPECTORATED SPUTUM  Final   Special Requests  NONE  Final   Sputum evaluation   Final    THIS SPECIMEN IS ACCEPTABLE FOR SPUTUM CULTURE Performed at [facility], [place] [address], [place], [place] [address]    Report Status 02/22/2022 FINAL  Final  Culture, Respiratory w Gram Stain     Status: None (Preliminary result)   Collection Time: 02/22/22  3:00 AM  Result Value Ref Range Status   Specimen Description EXPECTORATED SPUTUM  Final   Special Requests NONE Reflexed from [id number]  Final   Gram Stain   Final    RARE WBC PRESENT, PREDOMINANTLY PMN RARE BUDDING YEAST SEEN    Culture   Final     CULTURE REINCUBATED FOR BETTER GROWTH Performed at [facility], [place] [address] [place], [place] [address]    Report Status PENDING  Incomplete  Respiratory (~20 pathogens) panel by PCR     Status: None   Collection Time: 02/22/22 10:34 AM   Specimen: Nasopharyngeal Swab; Respiratory  Result Value Ref Range Status   Adenovirus NOT DETECTED NOT DETECTED Final   Coronavirus 229E NOT DETECTED NOT DETECTED Final    Comment: (NOTE) The Coronavirus on the Respiratory Panel, DOES NOT test for the novel  Coronavirus (2019 nCoV)    Coronavirus HKU1 NOT DETECTED NOT DETECTED Final   Coronavirus NL63 NOT DETECTED NOT DETECTED Final   Coronavirus OC43 NOT DETECTED NOT DETECTED Final   Metapneumovirus NOT DETECTED NOT DETECTED Final   Rhinovirus / Enterovirus NOT DETECTED NOT DETECTED Final   Influenza A NOT DETECTED NOT DETECTED Final   Influenza B NOT DETECTED NOT DETECTED Final   Parainfluenza Virus 1 NOT DETECTED NOT DETECTED Final   Parainfluenza Virus 2 NOT DETECTED NOT DETECTED Final   Parainfluenza Virus 3 NOT DETECTED NOT DETECTED Final   Parainfluenza Virus 4 NOT DETECTED NOT DETECTED Final   Respiratory Syncytial Virus NOT DETECTED NOT DETECTED Final   Bordetella pertussis NOT DETECTED NOT DETECTED Final   Bordetella Parapertussis NOT DETECTED NOT DETECTED Final   Chlamydophila pneumoniae NOT DETECTED NOT DETECTED Final   Mycoplasma pneumoniae NOT DETECTED NOT DETECTED Final    Comment: Performed at [facility], [place]. [address], [place], [place] [address]     Radiology Studies: No results found.   Marzetta Board, MD, PhD [facility]  Between 7 am - 7 pm I am available, please contact me via Amion (for emergencies) or Securechat (non urgent messages)  Between 7 pm - 7 am I am not available, please contact night coverage MD/APP via Amion

## 2022-02-25 NOTE — Care Management Important Message (Signed)
Important Message  Patient Details  Name: [name] MRN: [id number] Date of Birth: [birth year]   Medicare Important Message Given:  Yes     Orbie Pyo 02/25/2022, 2:22 PM

## 2022-02-25 NOTE — Progress Notes (Signed)
Modified Barium Swallow Progress Note  Patient Details  Name: [name] MRN: [id number] Date of Birth: 1950-06-28  Today's Date: 02/25/2022  Modified Barium Swallow completed.  Full report located under Chart Review in the Imaging Section.  Brief recommendations include the following:  Clinical Impression  Patient presents with a functional oral phase of swallow and a mild pharyngeal, mild cervical esophageal dysphagia. Barium consistencies tested were: thin liquid, puree solid, mechanical soft solid, 13 mm barium tablet. With thin liquids, swallow initiation delays observed at level of the pyriform sinus and with puree and soft solids, delay was observed at level of vallecular sinus. No aspiration observed with any of the tested barium consistencies but 1-2 instances of very shallow, almost imperceptible penetration observed with thin liquids. 13 mm barium tablet transited without difficulty and no stasis at any phase of swallow. Patient did exhibit mild motility slowing with puree and soft solid texture boluses but no significant PO residuals post initial swallow observed. SLP suspects mildly decreased UES opening and decreased cervical esophageal transit but this did not significantly impact patient's swallow funciton or safety. As per this MBS, not likely that her PNA is from oral or pharyngeal swallow function.   Swallow Evaluation Recommendations       SLP Diet Recommendations: Regular solids;Thin liquid   Liquid Administration via: Cup;Straw   Medication Administration: Whole meds with liquid   Supervision: Patient able to self feed   Compensations: Slow rate;Small sips/bites   Postural Changes: Seated upright at 90 degrees   Oral Care Recommendations: Oral care BID      Sonia Baller, MA, CCC-SLP Speech Therapy

## 2022-02-25 NOTE — Progress Notes (Signed)
Mobility Specialist Progress Note    02/25/22 1551  Mobility  Activity Ambulated with assistance in hallway  Level of Assistance Contact guard assist, steadying assist  Assistive Device Front wheel walker  Distance Ambulated (ft) 110 ft  Activity Response Tolerated fair  Mobility Referral Yes  $Mobility charge 1 Mobility   Pre-Mobility: 69 HR Post-Mobility: 70 HR, 88% SpO2  Pt received in bed and agreeable. C/o fatigue from being busy today. Pt a little more unsteady on her feet than usual having x1 small LOB requiring minA to recover. SpO2 down to 80% on RA. Returned to sitting EOB with call bell in reach and RN present.   Hildred Alamin Mobility Specialist  Please Psychologist, sport and exercise or Rehab Office at [phone]

## 2022-02-26 DIAGNOSIS — J189 Pneumonia, unspecified organism: Secondary | ICD-10-CM | POA: Diagnosis not present

## 2022-02-26 LAB — CBC
HCT: 30.5 % — ABNORMAL LOW (ref 36.0–46.0)
Hemoglobin: 10.2 g/dL — ABNORMAL LOW (ref 12.0–15.0)
MCH: 27.9 pg (ref 26.0–34.0)
MCHC: 33.4 g/dL (ref 30.0–36.0)
MCV: 83.6 fL (ref 80.0–100.0)
Platelets: 176 10*3/uL (ref 150–400)
RBC: 3.65 MIL/uL — ABNORMAL LOW (ref 3.87–5.11)
RDW: 13.5 % (ref 11.5–15.5)
WBC: 3 10*3/uL — ABNORMAL LOW (ref 4.0–10.5)
nRBC: 0 % (ref 0.0–0.2)

## 2022-02-26 LAB — BASIC METABOLIC PANEL
Anion gap: 10 (ref 5–15)
BUN: 33 mg/dL — ABNORMAL HIGH (ref 8–23)
CO2: 29 mmol/L (ref 22–32)
Calcium: 9 mg/dL (ref 8.9–10.3)
Chloride: 97 mmol/L — ABNORMAL LOW (ref 98–111)
Creatinine, Ser: 1.16 mg/dL — ABNORMAL HIGH (ref 0.44–1.00)
GFR, Estimated: 51 mL/min — ABNORMAL LOW (ref >60)
Glucose, Bld: 90 mg/dL (ref 70–99)
Potassium: 3.8 mmol/L (ref 3.5–5.1)
Sodium: 136 mmol/L (ref 135–145)

## 2022-02-26 LAB — GLUCOSE, CAPILLARY
Glucose-Capillary: 65 mg/dL — ABNORMAL LOW (ref 70–99)
Glucose-Capillary: 95 mg/dL (ref 70–99)
Glucose-Capillary: 66 mg/dL — ABNORMAL LOW (ref 70–99)
Glucose-Capillary: 103 mg/dL — ABNORMAL HIGH (ref 70–99)
Glucose-Capillary: 206 mg/dL — ABNORMAL HIGH (ref 70–99)
Glucose-Capillary: 246 mg/dL — ABNORMAL HIGH (ref 70–99)

## 2022-02-26 LAB — MAGNESIUM: Magnesium: 2 mg/dL (ref 1.7–2.4)

## 2022-02-26 MED ORDER — ORAL CARE MOUTH RINSE: 15.0000 mL | OROMUCOSAL | Status: DC | PRN

## 2022-02-26 MED ORDER — FUROSEMIDE 10 MG/ML IJ SOLN
40.0000 mg | Freq: Once | INTRAMUSCULAR | Status: AC
  Administered 2022-02-26: 40 mg via INTRAVENOUS
  Filled 2022-02-26: qty 4

## 2022-02-26 MED ORDER — INSULIN DETEMIR 100 UNIT/ML ~~LOC~~ SOLN
20.0000 [IU] | Freq: Two times a day (BID) | SUBCUTANEOUS | Status: DC
  Administered 2022-02-26 – 2022-02-27 (×2): 20 [IU] via SUBCUTANEOUS
  Filled 2022-02-26 (×3): qty 0.2

## 2022-02-26 MED ORDER — POTASSIUM CHLORIDE CRYS ER 20 MEQ PO TBCR
40.0000 meq | EXTENDED_RELEASE_TABLET | Freq: Once | ORAL | Status: AC
  Administered 2022-02-26: 40 meq via ORAL
  Filled 2022-02-26: qty 2

## 2022-02-26 NOTE — Plan of Care (Signed)
  Problem: Activity: Goal: Ability to tolerate increased activity will improve Outcome: Progressing   Problem: Clinical Measurements: Goal: Ability to maintain a body temperature in the normal range will improve Outcome: Progressing   Problem: Respiratory: Goal: Ability to maintain adequate ventilation will improve Outcome: Progressing   Problem: Coping: Goal: Ability to adjust to condition or change in health will improve Outcome: Progressing   Problem: Skin Integrity: Goal: Risk for impaired skin integrity will decrease Outcome: Progressing   Problem: Tissue Perfusion: Goal: Adequacy of tissue perfusion will improve Outcome: Progressing

## 2022-02-26 NOTE — Inpatient Diabetes Management (Signed)
Inpatient Diabetes Program Recommendations  AACE/ADA: New Consensus Statement on Inpatient Glycemic Control (2015)  Target Ranges:  Prepandial:   less than 140 mg/dL      Peak postprandial:   less than 180 mg/dL (1-2 hours)      Critically ill patients:  140 - 180 mg/dL   Lab Results  Component Value Date   GLUCAP 95 02/26/2022   HGBA1C 5.7 (H) 02/22/2022    Latest Reference Range & Units 02/25/22 11:34 02/25/22 16:13 02/25/22 21:15 02/26/22 06:06 02/26/22 06:39  Glucose-Capillary 70 - 99 mg/dL 174 (H) 192 (H) 90 65 (L) 95  (H): Data is abnormally high (L): Data is abnormally low Review of Glycemic Control  Diabetes history: type 2 Outpatient Diabetes medications: Levemir 25 units BID, Novolog 10-20 units BID, ozempic 1 mg weekly Current orders for Inpatient glycemic control: Levemir 25 units BID, Novolog 0-6 units TID correction scale, Novolog 0-5 units at [facility]  Inpatient Diabetes Program Recommendations:   Noted that patient had a low CBG of 65 mg/dl at 6:06 am today.   Recommend decreasing Levemir to 20 units BID if blood sugars continue to be low.  Continue Novolog correction scale as ordered.   Harvel Ricks RN BSN CDE Diabetes Coordinator Pager: [phone]  8am-5pm

## 2022-02-26 NOTE — Progress Notes (Signed)
Mobility Specialist Progress Note    02/26/22 1433  Mobility  Activity Ambulated with assistance in hallway  Level of Assistance Contact guard assist, steadying assist  Assistive Device Four wheel walker  Distance Ambulated (ft) 160 ft  Activity Response Tolerated well  Mobility Referral Yes  $Mobility charge 1 Mobility   Pre-Mobility: 67 HR, 90% SpO2 During Mobility: HR, >/=87% SpO2 on RA Post-Mobility: 66 HR, 92% SpO2  Pt received in chair and agreeable. Had void on BSC. No complaints on walk. Encouraged pursed lip breathing. Took x1 seated rest break. Returned to chair with call bell in reach.    Hildred Alamin Mobility Specialist  Please Psychologist, sport and exercise or Rehab Office at [phone]
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
## 2022-02-26 NOTE — Progress Notes (Signed)
PROGRESS NOTE  [name] EKC:[id number] DOB: 05-Apr-1950 DOA: 02/21/2022 PCP: Crist Infante, MD   LOS: 4 days   Brief Narrative / Interim history: 71 year old with history of HTN, insulin-dependent DM 2, OSA on CPAP, and depression, anxiety, cirrhosis, pancytopenia comes to the ED with worsening shortness of breath.  She was diagnosed of pneumonia and started on antibiotics about a week ago but due to worsening of shortness of breath she came to the ER.  In the ED CT chest was negative for PE but concerning for diffuse bilateral airspace opacity.  COVID/flu were negative.  BNP 530.  She was started on IV vancomycin, cefepime.  MRSA was negative therefore vancomycin was discontinued.  Over the last 48 hours patient's oxygen requirement is increased.  Respiratory panel is negative   Subjective / 24h Interval events: Walked twice yesterday, first time was good but the second time felt quite tired.  Still requires oxygen with ambulation  Assesement and Plan: Principal Problem:   Pneumonia Active Problems:   Insulin dependent type 2 diabetes mellitus (HCC)   Acute respiratory failure with hypoxia (HCC)   Stage 3b chronic kidney disease (CKD) ([place])   Depression with anxiety   Leukopenia  Principal problem Acute hypoxic respiratory failure due to multifocal pneumonia-patient was profoundly hypoxic, tachypneic on admission requiring heated high flow, currently has been weaned off to just couple liters..  She appears comfortable with sats in the upper 90s, try to wean off as tolerated.  She had wheezing and was started on steroids, continue along with nebulizers, antibiotics with cefepime and azithromycin. -Repeat Lasix today  Active problems Chronic diastolic TAV-6P echo done 12/4 shows LVEF 79-48%, grade 1 diastolic dysfunction.  Repeat Lasix today  CKD 3A-baseline creatinine ranging between 1.0-1.4, currently at baseline  Iron deficiency anemia-mild, monitored by hematology as an  outpatient  Hypertension -continue metoprolol   Hyperthyroidism-on PTU   Depression, anxiety - Continue Prozac, Seroquel, and as-needed Klonopin   Insulin-dependent DM with steroid-induced hyperglycemia -A1c 5.7.  CBGs on the high side due to steroids.  Decrease insulin, had an episode of hypoglycemia  CBG (last 3)  Recent Labs    02/25/22 2115 02/26/22 0606 02/26/22 0639  GLUCAP 90 65* 95    Scheduled Meds:  budesonide (PULMICORT) nebulizer solution  0.5 mg Nebulization BID   enoxaparin (LOVENOX) injection  50 mg Subcutaneous Q24H   fenofibrate  54 mg Oral Q supper   FLUoxetine  60 mg Oral QHS   insulin aspart  0-5 Units Subcutaneous QHS   insulin aspart  0-6 Units Subcutaneous TID WC   insulin detemir  20 Units Subcutaneous BID   ipratropium-albuterol  3 mL Nebulization BID   metoprolol tartrate  50 mg Oral TID   oxybutynin  10 mg Oral Daily   pantoprazole  40 mg Oral Daily   predniSONE  40 mg Oral Q breakfast   propylthiouracil  50 mg Oral Daily   QUEtiapine  50 mg Oral QHS   rOPINIRole  2 mg Oral QHS   sodium chloride flush  3 mL Intravenous Q12H   Continuous Infusions:  sodium chloride 10 mL/hr at 02/24/22 0000   ceFEPime (MAXIPIME) IV 2 g (02/26/22 0921)   PRN Meds:.sodium chloride, acetaminophen, clonazePAM, gabapentin, guaiFENesin, hydrALAZINE, ipratropium-albuterol, metoprolol tartrate, ondansetron **OR** ondansetron (ZOFRAN) IV, oxyCODONE, senna-docusate  Current Outpatient Medications  Medication Instructions   acetaminophen (TYLENOL) 1,300 mg, Oral, Every 8 hours PRN   amLODipine (NORVASC) 5 mg, Oral, Daily at bedtime   BD INSULIN  SYRINGE U/F 31G X 5/16" 0.5 ML MISC USE 5 TIMES A DAY LEVEMIR AND NOVOLOG   BD PEN NEEDLE MICRO U/F 32G X 6 MM MISC 32 pens., Subcutaneous, Daily   clonazePAM (KLONOPIN) 0.5 mg, Oral, See admin instructions, Take 0.5 mg at bedtime, may take an additional 0.5 mg tablet twice daily as needed for anxiety    Dialyvite Vitamin D 5000  5,000 Units, Oral, Daily   diclofenac (VOLTAREN) 75 mg, Oral, 2 times daily   doxazosin (CARDURA) 2 mg, Oral, Daily   fenofibrate 54 mg, Oral, Daily with supper   FLUoxetine (PROZAC) 60 mg, Oral, Daily at bedtime   FREESTYLE TEST STRIPS test strip USE STRIPS TO TEST BLOOD GLUCOSE BID   gabapentin (NEURONTIN) 300 mg, Oral, 3 times daily   hyoscyamine (LEVSIN SL) 0.125 mg, Sublingual, Every 6 hours PRN   insulin aspart (NOVOLOG) 100 UNIT/ML FlexPen Sliding scale dose:<BR>CBG < 70: implement hypoglycemia protocol <BR>CBG 70 - 120: 0 units <BR>CBG 121 - 150: 0 units <BR>CBG 151 - 200: 0 units <BR>CBG 201 - 250: 2 units <BR>CBG 251 - 300: 3 units <BR>CBG 301 - 350: 4 units <BR>CBG 351 - 400: 5 units   insulin detemir (LEVEMIR) 25 Units, Subcutaneous, 2 times daily   Lancets Misc. (UNISTIK 3 COMFORT) MISC USE LANCETS TO CHECK BLOOD GLUCOSE BID   metoprolol tartrate (LOPRESSOR) 50 mg, Oral, 3 times daily   omeprazole (PRILOSEC) 20 mg, Oral, Daily with supper   oxybutynin (DITROPAN-XL) 10 mg, Oral, Daily   Ozempic (1 MG/DOSE) 1 mg, Subcutaneous, Weekly, Thursdays   propylthiouracil (PTU) 50 mg, Oral, Daily   QUEtiapine (SEROQUEL) 50 mg, Oral, Daily at bedtime   rOPINIRole (REQUIP) 2 mg, Oral, See admin instructions, Take 2 mg at bedtime, may take a second 2 mg dose as needed for restless legs    SYMBICORT 160-4.5 MCG/ACT inhaler 2 puffs, Inhalation, 2 times daily   tiZANidine (ZANAFLEX) 4 mg, Oral, 2 times daily PRN   VENTOLIN HFA 108 (90 Base) MCG/ACT inhaler 2 puffs, Inhalation, Every 4 hours PRN    Diet Orders (From admission, onward)     Start     Ordered   02/22/22 0258  Diet Carb Modified Fluid consistency: Thin; Room service appropriate? Yes  Diet effective now       Question Answer Comment  Diet-HS Snack? Nothing   Calorie Level Medium 1600-2000   Fluid consistency: Thin   Room service appropriate? Yes      02/22/22 0300            DVT prophylaxis:    Lab Results   Component Value Date   PLT 176 02/26/2022      Code Status: Full Code  Family Communication: no family at bedside  Status is: Inpatient  Remains inpatient appropriate because: severity of illness  Level of care: Progressive  Consultants:  none  Objective: Vitals:   02/26/22 0718 02/26/22 0833 02/26/22 0927 02/26/22 0930  BP:  132/69    Pulse:  63 61   Resp:  19 18   Temp:  97.7 F (36.5 C)    TempSrc:  Oral    SpO2: 98% 99% 99% 99%  Weight:      Height:        Intake/Output Summary (Last 24 hours) at 02/26/2022 1054 Last data filed at 02/25/2022 2257 Gross per 24 hour  Intake 600 ml  Output 200 ml  Net 400 ml    Wt Readings from Last 3 Encounters:  02/26/22  98.6 kg  02/15/22 102.2 kg  02/02/22 104.5 kg    Examination:  Constitutional: NAD Eyes: lids and conjunctivae normal, no scleral icterus ENMT: mmm Neck: normal, supple Respiratory: clear to auscultation bilaterally, no wheezing, no crackles. Normal respiratory effort.  Cardiovascular: Regular rate and rhythm, no murmurs / rubs / gallops. No LE edema. Abdomen: soft, no distention, no tenderness. Bowel sounds positive.  Skin: no rashes Neurologic: no focal deficits, equal strength  Data Reviewed: I have independently reviewed following labs and imaging studies   CBC Recent Labs  Lab 02/21/22 1715 02/22/22 0306 02/23/22 0425 02/24/22 0016 02/25/22 0033 02/26/22 0041  WBC 3.9* 2.3* 2.9* 2.1* 3.6* 3.0*  HGB 10.6* 9.1* 9.5* 10.4* 10.5* 10.2*  HCT 34.0* 27.4* 29.9* 31.2* 31.9* 30.5*  PLT 183 162 170 169 194 176  MCV 87.6 85.4 86.9 83.4 82.9 83.6  MCH 27.3 28.3 27.6 27.8 27.3 27.9  MCHC 31.2 33.2 31.8 33.3 32.9 33.4  RDW 14.6 14.6 14.6 14.1 13.6 13.5  LYMPHSABS 0.3*  --   --   --   --   --   MONOABS 0.2  --   --   --   --   --   EOSABS 0.0  --   --   --   --   --   BASOSABS 0.0  --   --   --   --   --      Recent Labs  Lab 02/21/22 1706 02/21/22 1715 02/21/22 1715 02/22/22 0306  02/23/22 0425 02/24/22 0016 02/25/22 0033 02/26/22 0041  NA  --  142   < > 139 144 135 137 136  K  --  3.4*   < > 3.8 3.5 3.8 4.1 3.8  CL  --  105   < > 108 102 95* 95* 97*  CO2  --  26   < > '24 28 30 31 29  '$ GLUCOSE  --  89   < > 96 70 285* 218* 90  BUN  --  22   < > 19 16 24* 31* 33*  CREATININE  --  1.39*   < > 1.25* 1.30* 1.51* 1.32* 1.16*  CALCIUM  --  9.1   < > 8.2* 9.3 8.9 9.5 9.0  AST  --  18  --   --   --   --  15  --   ALT  --  12  --   --   --   --  12  --   ALKPHOS  --  76  --   --   --   --  69  --   BILITOT  --  0.5  --   --   --   --  0.4  --   ALBUMIN  --  2.5*  --   --   --   --  2.5*  --   MG  --   --   --  1.8 1.9 1.8 2.0 2.0  PROCALCITON  --   --   --  <0.10 <0.10 <0.10  --   --   LATICACIDVEN  --  1.1  --   --   --   --   --   --   HGBA1C  --   --   --  5.7*  --   --   --   --   BNP 530.2*  --   --   --   --   --   --   --    < > =  values in this interval not displayed.     ------------------------------------------------------------------------------------------------------------------ No results for input(s): "CHOL", "HDL", "LDLCALC", "TRIG", "CHOLHDL", "LDLDIRECT" in the last 72 hours.  Lab Results  Component Value Date   HGBA1C 5.7 (H) 02/22/2022   ------------------------------------------------------------------------------------------------------------------ No results for input(s): "TSH", "T4TOTAL", "T3FREE", "THYROIDAB" in the last 72 hours.  Invalid input(s): "FREET3"  Cardiac Enzymes No results for input(s): "CKMB", "TROPONINI", "MYOGLOBIN" in the last 168 hours.  Invalid input(s): "CK" ------------------------------------------------------------------------------------------------------------------    Component Value Date/Time   BNP 530.2 (H) 02/21/2022 1706    CBG: Recent Labs  Lab 02/25/22 1134 02/25/22 1613 02/25/22 2115 02/26/22 0606 02/26/22 0639  GLUCAP 174* 192* 90 65* 95     Recent Results (from the past 240 hour(s))   Resp Panel by RT-PCR (Flu A&B, Covid) Anterior Nasal Swab     Status: None   Collection Time: 02/21/22  5:15 PM   Specimen: Anterior Nasal Swab  Result Value Ref Range Status   SARS Coronavirus 2 by RT PCR NEGATIVE NEGATIVE Final    Comment: (NOTE) SARS-CoV-2 target nucleic acids are NOT DETECTED.  The SARS-CoV-2 RNA is generally detectable in upper respiratory specimens during the acute phase of infection. The lowest concentration of SARS-CoV-2 viral copies this assay can detect is 138 copies/mL. A negative result does not preclude SARS-Cov-2 infection and should not be used as the sole basis for treatment or other patient management decisions. A negative result may occur with  improper specimen collection/handling, submission of specimen other than nasopharyngeal swab, presence of viral mutation(s) within the areas targeted by this assay, and inadequate number of viral copies(<138 copies/mL). A negative result must be combined with clinical observations, patient history, and epidemiological information. The expected result is Negative.  Fact Sheet for Patients:  EntrepreneurPulse.com.au  Fact Sheet for Healthcare Providers:  IncredibleEmployment.be  This test is no t yet approved or cleared by the [place] FDA and  has been authorized for detection and/or diagnosis of SARS-CoV-2 by FDA under an Emergency Use Authorization (EUA). This EUA will remain  in effect (meaning this test can be used) for the duration of the COVID-19 declaration under Section 564(b)(1) of the Act, 21 U.S.C.section 360bbb-3(b)(1), unless the authorization is terminated  or revoked sooner.       Influenza A by PCR NEGATIVE NEGATIVE Final   Influenza B by PCR NEGATIVE NEGATIVE Final    Comment: (NOTE) The Xpert Xpress SARS-CoV-2/FLU/RSV plus assay is intended as an aid in the diagnosis of influenza from Nasopharyngeal swab specimens and should not be used as a  sole basis for treatment. Nasal washings and aspirates are unacceptable for Xpert Xpress SARS-CoV-2/FLU/RSV testing.  Fact Sheet for Patients: EntrepreneurPulse.com.au  Fact Sheet for Healthcare Providers: IncredibleEmployment.be  This test is not yet approved or cleared by the [place] FDA and has been authorized for detection and/or diagnosis of SARS-CoV-2 by FDA under an Emergency Use Authorization (EUA). This EUA will remain in effect (meaning this test can be used) for the duration of the COVID-19 declaration under Section 564(b)(1) of the Act, 21 U.S.C. section 360bbb-3(b)(1), unless the authorization is terminated or revoked.  Performed at [facility], [place] [address], [place], [place] [address]   MRSA Next Gen by PCR, Nasal     Status: None   Collection Time: 02/21/22 11:46 PM   Specimen: Nasal Mucosa; Nasal Swab  Result Value Ref Range Status   MRSA by PCR Next Gen NOT DETECTED NOT DETECTED Final    Comment: (  NOTE) The GeneXpert MRSA Assay (FDA approved for NASAL specimens only), is one component of a comprehensive MRSA colonization surveillance program. It is not intended to diagnose MRSA infection nor to guide or monitor treatment for MRSA infections. Test performance is not FDA approved in patients less than 45 years old. Performed at [facility], [place] [address], [place], [place] [address]   Expectorated Sputum Assessment w Gram Stain, Rflx to Resp Cult     Status: None   Collection Time: 02/22/22  3:00 AM   Specimen: Expectorated Sputum  Result Value Ref Range Status   Specimen Description EXPECTORATED SPUTUM  Final   Special Requests NONE  Final   Sputum evaluation   Final    THIS SPECIMEN IS ACCEPTABLE FOR SPUTUM CULTURE Performed at [facility], [place] [address], [place], [place] [address]    Report Status 02/22/2022 FINAL  Final  Culture, Respiratory w Gram Stain     Status: None   Collection  Time: 02/22/22  3:00 AM  Result Value Ref Range Status   Specimen Description EXPECTORATED SPUTUM  Final   Special Requests NONE Reflexed from [id number]  Final   Gram Stain   Final    RARE WBC PRESENT, PREDOMINANTLY PMN RARE BUDDING YEAST SEEN Performed at [facility], [place] [address] [place], [place] [address]    Culture ABUNDANT CANDIDA ALBICANS  Final   Report Status 02/25/2022 FINAL  Final  Respiratory (~20 pathogens) panel by PCR     Status: None   Collection Time: 02/22/22 10:34 AM   Specimen: Nasopharyngeal Swab; Respiratory  Result Value Ref Range Status   Adenovirus NOT DETECTED NOT DETECTED Final   Coronavirus 229E NOT DETECTED NOT DETECTED Final    Comment: (NOTE) The Coronavirus on the Respiratory Panel, DOES NOT test for the novel  Coronavirus (2019 nCoV)    Coronavirus HKU1 NOT DETECTED NOT DETECTED Final   Coronavirus NL63 NOT DETECTED NOT DETECTED Final   Coronavirus OC43 NOT DETECTED NOT DETECTED Final   Metapneumovirus NOT DETECTED NOT DETECTED Final   Rhinovirus / Enterovirus NOT DETECTED NOT DETECTED Final   Influenza A NOT DETECTED NOT DETECTED Final   Influenza B NOT DETECTED NOT DETECTED Final   Parainfluenza Virus 1 NOT DETECTED NOT DETECTED Final   Parainfluenza Virus 2 NOT DETECTED NOT DETECTED Final   Parainfluenza Virus 3 NOT DETECTED NOT DETECTED Final   Parainfluenza Virus 4 NOT DETECTED NOT DETECTED Final   Respiratory Syncytial Virus NOT DETECTED NOT DETECTED Final   Bordetella pertussis NOT DETECTED NOT DETECTED Final   Bordetella Parapertussis NOT DETECTED NOT DETECTED Final   Chlamydophila pneumoniae NOT DETECTED NOT DETECTED Final   Mycoplasma pneumoniae NOT DETECTED NOT DETECTED Final    Comment: Performed at [facility], [place]. [address], [place],  [address]     Radiology Studies: DG Swallowing Func-Speech Pathology  Result Date: 02/25/2022 Table formatting from the original result was not included. Objective Swallowing  Evaluation: Type of Study: MBS-Modified Barium Swallow Study  Patient Details Name: KENNITA PAVLOVICH MRN: [id number] Date of Birth: 09/28/1950 Today's Date: 02/25/2022 Time: SLP Start Time (ACUTE ONLY): 1235 -SLP Stop Time (ACUTE ONLY): 1255 SLP Time Calculation (min) (ACUTE ONLY): 20 min Past Medical History: Past Medical History: Diagnosis Date  Anemia   Arthritis   Asthma   OCCAS  Bell's palsy   Broken neck (HCC)   C-1  Carbuncle and furuncle of trunk   Chronic dislocation of right shoulder  Depression   Disorder of fascia   HX OF NECROTIC FASCITIS AFTER ABDOMINAL SURGERY FOR HERNIA- REQUIRED 19 SURGERIES AND 2.5 MONTH HOSPITALIZATION AT [facility]  DM (diabetes mellitus) (HCC)   Dysrhythmia   HX OF TACHYCARDIA AND BRADYCARDIA - ON GOING FOR YEARS - DOES NOT HAVE TO SEE CARDIOLOGIST  Elevated cholesterol   Fibromyalgia   Fracture MAY 2014  HX OF FRACTURED NECK C1- CAUSES SEVERE HEADACHES--LIMITED ROM NECK  Frequent infections   ESPECIALLY PRONE TO INFECTIONS AFTER SURGERIES  Graves disease   Heart murmur   DOES NOT CAUSE ANY PROBLEMS  History of kidney stones   Hyperlipidemia   Hypertension   Hypothyroidism   GRAVES DISEASE  Migraine   Nephrolithiasis   STAGE 3    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  Neuropathy   Obesity   OSA (obstructive sleep apnea)   USES CPAP - DOES NOT KNOW SETTING  Pain   LEFT SHOULDER  PAIN -HARD TO LIE ON LEFT SIDE FOR LONG PERIOD;  PAIN IN LOWER BADK - 3 HERNIATED DISCS AND STENOISIS -  PONV (postoperative nausea and vomiting)   THE GAS MAKES ME NAUSEATED  Restless leg syndrome   Sciatica   Tachycardia   Urinary frequency   Urticaria   UTI (urinary tract infection)  Past Surgical History: Past Surgical History: Procedure Laterality Date  ABDOMINAL HYSTERECTOMY    LARGE TUMOR AT OVARY REMOVED  ANTERIOR LAT LUMBAR FUSION N/A 07/28/2018  Procedure: Anterolateral Decompression Lumbar One-Two for osteomyelitis reconstruction w/titanium strut allograft Fusion Lumbar One-Two;  Surgeon: Kristeen Miss, MD;   Location: [place];  Service: Neurosurgery;  Laterality: N/A;  Left anterolateral approach  APPENDECTOMY    APPLICATION OF ROBOTIC ASSISTANCE FOR SPINAL PROCEDURE N/A 09/24/4490  Procedure: APPLICATION OF ROBOTIC ASSISTANCE FOR SPINAL PROCEDURE;  Surgeon: Kristeen Miss, MD;  Location: [place];  Service: Neurosurgery;  Laterality: N/A;  CARPAL TUNNEL RELEASE    BILATERAL  CATARACT EXTRACTION W/ INTRAOCULAR LENS  IMPLANT, BILATERAL    CESAREAN SECTION    X 3  CHOLECYSTECTOMY    CYSTOSCOPY WITH URETEROSCOPY Right 12/03/2013  Procedure: CYSTOSCOPY WITH RIGHT RETROGRADE URETEROSCOPY LASER LITHOTRIPSY RIGHT STONE RIGHT URETERAL STENT, ;  Surgeon: Raynelle Bring, MD;  Location: [facility];  Service: Urology;  Laterality: Right;  PROCEDURE WAS ORIGINALLY SCHEDULED AS RIGHT PERCUTANEOUS NEPHROLITHOTOMY  EYELID LACERATION REPAIR    RIGHT EYE  HERNIA REPAIR    ABDOMINAL HERNIA REPAIR WITH MESH - 3 SURGERIES   HX OF 19 SURGERIES FOR NECROTIC FASCITIS    SKIN GRAFTS+ WOUND  VAC  I&D OF INFECTED SITE IN [place] - FROM AN INJECTION    IR FLUORO GUIDED NEEDLE PLC ASPIRATION/INJECTION LOC  06/03/2018  JOINT REPLACEMENT    TOTAL RIGHT KNEE REPLACEMENT  KNEE ARTHROSCOPY  RIGHT AND LEFT  X 2  LAPAROSCOPIC GASTRIC SLEEVE RESECTION  2014  NEPHROLITHOTOMY Right 12/10/2013  Procedure: NEPHROLITHOTOMY PERCUTANEOUS;  Surgeon: Raynelle Bring, MD;  Location: [facility];  Service: Urology;  Laterality: Right;  POSTERIOR LUMBAR FUSION 4 LEVEL N/A 07/28/2018  Procedure: Posterior Fixation Thoracic Ten-Lumbar Four with pedicle augmentaion with robotic assistance;  Surgeon: Kristeen Miss, MD;  Location: [place];  Service: Neurosurgery;  Laterality: N/A;  Posterior Fixation Thoracic Ten-Lumbar Four with pedicle augmentaion with robotic assistance  REVERSE SHOULDER ARTHROPLASTY Right 07/21/2017  REVERSE SHOULDER ARTHROPLASTY Right 07/21/2017  Procedure: RIGHT REVERSE SHOULDER ARTHROPLASTY;  Surgeon: Tania Ade, MD;  Location: [place];  Service: Orthopedics;  Laterality:  Right;  RIGHT FOOT DRAINAGE OF INFECTION  shoulder arthroscopy Right   X 2  TONSILLECTOMY    AND ADENOIDECTOMY HPI: Patient presents with a mild pharyngeal phase dysphagia characterized by decreased laryngeal closure resulting in trace intermittent penetration of thin liquid, never reaching the level of the vocal cords and clearing with subsequent swallows/cued throat clear, and moderate-severe post swallow vallecular residue, primarily with regular texture solids.  Subjective: pleasant, said MD told her she may get to discharge tomorrow  Recommendations for follow up therapy are one component of a multi-disciplinary discharge planning process, led by the attending physician.  Recommendations may be updated based on patient status, additional functional criteria and insurance authorization. Assessment / Plan / Recommendation   02/25/2022   2:20 PM Clinical Impressions Clinical Impression Patient presents with a functional oral phase of swallow and a mild pharyngeal, mild cervical esophageal dysphagia. Barium consistencies tested were: thin liquid, puree solid, mechanical soft solid, 13 mm barium tablet. With thin liquids, swallow initiation delays observed at level of the pyriform sinus and with puree and soft solids, delay was observed at level of vallecular sinus. No aspiration observed with any of the tested barium consistencies but 1-2 instances of very shallow, almost imperceptible penetration observed with thin liquids. 13 mm barium tablet transited without difficulty and no stasis at any phase of swallow. Patient did exhibit mild motility slowing with puree and soft solid texture boluses but no significant PO residuals post initial swallow observed. SLP suspects mildly decreased UES opening and decreased cervical esophageal transit but this did not significantly impact patient's swallow funciton or safety. As per this MBS, not likely that her PNA is from oral or pharyngeal swallow function. SLP Visit  Diagnosis Dysphagia, pharyngoesophageal phase (R13.14) Impact on safety and function No limitations;Mild aspiration risk     02/25/2022   2:20 PM Treatment Recommendations Treatment Recommendations No treatment recommended at this time     02/25/2022   2:28 PM Prognosis Prognosis for Safe Diet Advancement Good   02/25/2022   2:20 PM Diet Recommendations SLP Diet Recommendations Regular solids;Thin liquid Liquid Administration via Cup;Straw Medication Administration Whole meds with liquid Compensations Slow rate;Small sips/bites Postural Changes Seated upright at 90 degrees     02/25/2022   2:20 PM Other Recommendations Oral Care Recommendations Oral care BID Follow Up Recommendations No SLP follow up Functional Status Assessment Patient has had a recent decline in their functional status and demonstrates the ability to make significant improvements in function in a reasonable and predictable amount of time.   02/24/2022   3:10 PM Frequency and Duration  Speech Therapy Frequency (ACUTE ONLY) min 1 x/week Treatment Duration 1 week     02/25/2022   2:16 PM Oral Phase Oral Phase [facility]    02/25/2022   2:16 PM Pharyngeal Phase Pharyngeal Phase Impaired Pharyngeal- Thin Cup Delayed swallow initiation-vallecula;Delayed swallow initiation-pyriform sinuses Pharyngeal- Thin Straw Delayed swallow initiation-vallecula Pharyngeal- Puree Delayed swallow initiation-vallecula;Reduced pharyngeal peristalsis Pharyngeal- Mechanical Soft Delayed swallow initiation-vallecula;Reduced pharyngeal peristalsis Pharyngeal- [facility]    02/25/2022   2:20 PM Cervical Esophageal Phase  Cervical Esophageal Phase Impaired Cervical Esophageal Comment mildly reduced UES opening Sonia Baller, MA, CCC-SLP Speech Therapy                       Marzetta Board, MD, PhD [facility]  Between 7 am - 7 pm I am available, please contact me via Amion (for emergencies) or Securechat (non urgent messages)  Between 7 pm - 7 am I am not available,  please  contact night coverage MD/APP via Amion

## 2022-02-26 NOTE — Progress Notes (Signed)
Hypoglycemic Event  CBG: 66  Treatment: 8 oz juice/soda  Symptoms: None  Follow-up CBG: Time:1215 CBG Result:103  Possible Reasons for Event: Medication regimen:    Comments/MD notified: dr. Urbano Heir, Cletus Gash I

## 2022-02-26 NOTE — Progress Notes (Signed)
Physical Therapy Treatment Patient Details Name: [name] MRN: [id number] DOB: 1950-11-25 Today's Date: 02/26/2022   History of Present Illness Pt is a 71 y.o.female admitted 02/21/22 with worsening SOB; workup for acute hypoxic respiratory failure due to multifocal PNA. PMH includes DM2, OSA on CPAP, depression, anxiety, cirrhosis, pancytopenia.   PT Comments    Pt progressing with mobility. Today's session focused on ambulation for improving strength and activity tolerance; SpO2 briefly down to 86-87% on RA, while mostly maintaining 89-91% on RA with ambulation. Discussed recommendation for rollator for added stability and energy conservation; pt agreeable to trial next time she walks. Pt hopeful for d/c home tomorrow. If to remain admitted, will continue to follow acutely.  SATURATION QUALIFICATIONS:  Patient Saturations on Room Air at Rest = 94% Patient Saturations on Room Air while Ambulating = 86%    Recommendations for follow up therapy are one component of a multi-disciplinary discharge planning process, led by the attending physician.  Recommendations may be updated based on patient status, additional functional criteria and insurance authorization.  Follow Up Recommendations  Home health PT     Assistance Recommended at Discharge PRN  Patient can return home with the following A little help with bathing/dressing/bathroom;Assistance with cooking/housework;Assist for transportation;Help with stairs or ramp for entrance   Equipment Recommendations   (TBD on standard rollator)    Recommendations for Other Services  Mobility Specialist     Precautions / Restrictions Precautions Precautions: Fall;Other (comment) Precaution Comments: Watch SpO2 (does not wear O2 baseline) Restrictions Weight Bearing Restrictions: No     Mobility  Bed Mobility               General bed mobility comments: received in chair    Transfers Overall transfer level: Needs  assistance Equipment used: Rolling walker (2 wheels) Transfers: Sit to/from Stand Sit to Stand: Supervision           General transfer comment: mod indep standing with RW; supervision for safety when going to sit as pt bring walker in odd direction resulting in instability, educ on safer technique    Ambulation/Gait Ambulation/Gait assistance: Min guard, Supervision Gait Distance (Feet): 120 Feet Assistive device: Rolling walker (2 wheels) Gait Pattern/deviations: Step-through pattern, Decreased stride length, Wide base of support Gait velocity: Decreased     General Gait Details: slow, mostly steady gait with RW and intermittent min guard for balance; pt at times taking side steps while walking straight, which leads her to pick up walker to readjust resulting instability - educ on safer strategy with this and turning to reduce fall risk. cues for activity pacing as pt asking to go further but also c/o BLE fatigue   Stairs             Wheelchair Mobility    Modified Rankin (Stroke Patients Only)       Balance Overall balance assessment: Needs assistance   Sitting balance-Leahy Scale: Fair     Standing balance support: No upper extremity supported, During functional activity Standing balance-Leahy Scale: Fair Standing balance comment: can static stand and take steps without UE support                            Cognition Arousal/Alertness: Awake/alert Behavior During Therapy: WFL for tasks assessed/performed Overall Cognitive Status: Within Functional Limits for tasks assessed  General Comments: WFL for simple tasks, not formally assessed        Exercises      General Comments General comments (skin integrity, edema, etc.): once starting to eat lunch at end of session, pt states that her main balance issue is walking while holding something ("even if just a washrag"), as in she tends to fall while  walking carrying an object. had already discussed rollator recommendation for stability and energy conservation, this would also give her platform to set things on to carry; will trial rollator next session. SpO2 briefly down to 86-87% on RA with ambulation, mostly maintaining >/89%      Pertinent Vitals/Pain Pain Assessment Pain Assessment: No/denies pain    Home Living                          Prior Function            PT Goals (current goals can now be found in the care plan section) Progress towards PT goals: Progressing toward goals    Frequency    Min 3X/week      PT Plan Current plan remains appropriate    Co-evaluation              AM-PAC PT "6 Clicks" Mobility   Outcome Measure  Help needed turning from your back to your side while in a flat bed without using bedrails?: A Little Help needed moving from lying on your back to sitting on the side of a flat bed without using bedrails?: A Little Help needed moving to and from a bed to a chair (including a wheelchair)?: A Little Help needed standing up from a chair using your arms (e.g., wheelchair or bedside chair)?: A Little Help needed to walk in hospital room?: A Little Help needed climbing 3-5 steps with a railing? : A Lot 6 Click Score: 17    End of Session Equipment Utilized During Treatment: Gait belt Activity Tolerance: Patient tolerated treatment well Patient left: in chair;with call bell/phone within reach Nurse Communication: Mobility status PT Visit Diagnosis: Difficulty in walking, not elsewhere classified (R26.2);Unsteadiness on feet (R26.81)     Time: 2951-8841 PT Time Calculation (min) (ACUTE ONLY): 20 min  Charges:  $Therapeutic Exercise: 8-22 mins                     Mabeline Caras, PT, DPT Acute Rehabilitation Services  Personal: [place] [facility] Office: [place] 02/26/2022, 1:48 PM

## 2022-02-27 DIAGNOSIS — J189 Pneumonia, unspecified organism: Secondary | ICD-10-CM | POA: Diagnosis not present

## 2022-02-27 LAB — CBC
HCT: 32 % — ABNORMAL LOW (ref 36.0–46.0)
Hemoglobin: 10.6 g/dL — ABNORMAL LOW (ref 12.0–15.0)
MCH: 27.5 pg (ref 26.0–34.0)
MCHC: 33.1 g/dL (ref 30.0–36.0)
MCV: 82.9 fL (ref 80.0–100.0)
Platelets: 168 10*3/uL (ref 150–400)
RBC: 3.86 MIL/uL — ABNORMAL LOW (ref 3.87–5.11)
RDW: 13.5 % (ref 11.5–15.5)
WBC: 2.7 10*3/uL — ABNORMAL LOW (ref 4.0–10.5)
nRBC: 0 % (ref 0.0–0.2)

## 2022-02-27 LAB — BASIC METABOLIC PANEL
Anion gap: 11 (ref 5–15)
BUN: 37 mg/dL — ABNORMAL HIGH (ref 8–23)
CO2: 30 mmol/L (ref 22–32)
Calcium: 9.4 mg/dL (ref 8.9–10.3)
Chloride: 96 mmol/L — ABNORMAL LOW (ref 98–111)
Creatinine, Ser: 1.3 mg/dL — ABNORMAL HIGH (ref 0.44–1.00)
GFR, Estimated: 45 mL/min — ABNORMAL LOW (ref >60)
Glucose, Bld: 253 mg/dL — ABNORMAL HIGH (ref 70–99)
Potassium: 4.7 mmol/L (ref 3.5–5.1)
Sodium: 137 mmol/L (ref 135–145)

## 2022-02-27 LAB — GLUCOSE, CAPILLARY: Glucose-Capillary: 111 mg/dL — ABNORMAL HIGH (ref 70–99)

## 2022-02-27 LAB — MAGNESIUM: Magnesium: 2.2 mg/dL (ref 1.7–2.4)

## 2022-02-27 MED ORDER — NYSTATIN 100000 UNIT/ML MT SUSP: 5.0000 mL | Freq: Four times a day (QID) | OROMUCOSAL | 0 refills | Status: AC

## 2022-02-27 MED ORDER — PREDNISONE 20 MG PO TABS: ORAL_TABLET | ORAL | 0 refills | Status: AC

## 2022-02-27 MED ORDER — NYSTATIN 100000 UNIT/ML MT SUSP
5.0000 mL | Freq: Four times a day (QID) | OROMUCOSAL | Status: DC
  Administered 2022-02-27: 500000 [IU] via OROMUCOSAL
  Filled 2022-02-27: qty 5

## 2022-02-27 MED ORDER — DOXYCYCLINE HYCLATE 100 MG PO TABS: 100.0000 mg | ORAL_TABLET | Freq: Two times a day (BID) | ORAL | 0 refills | Status: AC

## 2022-02-27 NOTE — Progress Notes (Signed)
Oxygen delivered, discharged home accompanied by son, belongings taken home.

## 2022-02-27 NOTE — TOC Transition Note (Signed)
Transition of Care [facility]) - CM/SW Discharge Note  Patient Details  Name: NAYDENE KAMROWSKI MRN: [id number] Date of Birth: [birth year]  [facility] CM/SW Contact:  Gayla Medicus., RN Phone Number: 02/27/2022, 9:23 AM  Clinical Narrative:     RNCM received order for Oxygen.  Jasmine at [place] notified of order.  Final next level of care: [place] Barriers to Discharge: No Barriers Identified  Patient Goals and CMS Choice Patient states their goals for this hospitalization and ongoing recovery are:: to return to home CMS Medicare.gov Compare Post Acute Care list provided to:: Patient Choice offered to / list presented to : Patient  Discharge Placement                      Discharge Plan and Services   Discharge Planning Services: CM Consult Post Acute Care Choice: Home Health          DME Arranged: Oxygen DME Agency: [employer] Date DME Agency Contacted: 02/27/22 Time DME Agency Contacted: [phone] Representative spoke with at DME Agency: [place]: RN, PT [facility] Agency: [place] ([place]) Date [place]: 02/26/22 Time [place]: 1053 Representative spoke with at [place]: [place] ([place]) Interventions   Readmission Risk Interventions     No data to display

## 2022-02-27 NOTE — Discharge Instructions (Signed)
Follow with Crist Infante, MD in 1-2 weeks  Please get a complete blood count and chemistry panel checked by your Primary MD at your next visit, and again as instructed by your Primary MD. Please get your medications reviewed and adjusted by your Primary MD.  Please request your Primary MD to go over all Hospital Tests and Procedure/Radiological results at the follow up, please get all Hospital records sent to your Prim MD by signing hospital release before you go home.  In some cases, there will be blood work, cultures and biopsy results pending at the time of your discharge. Please request that your primary care M.D. goes through all the records of your hospital data and follows up on these results.  If you had Pneumonia of Lung problems at the Hospital: Please get a 2 view Chest X ray done in 2 weeks after hospital discharge or sooner if instructed by your Primary MD.  If you have Congestive Heart Failure: Please call your Cardiologist or Primary MD anytime you have any of the following symptoms:  1) 3 pound weight gain in 24 hours or 5 pounds in 1 week  2) shortness of breath, with or without a dry hacking cough  3) swelling in the hands, feet or stomach  4) if you have to sleep on extra pillows at night in order to breathe  Follow cardiac low salt diet and 1.5 lit/day fluid restriction.  If you have diabetes Accuchecks 4 times/day, Once in AM empty stomach and then before each meal. Log in all results and show them to your primary doctor at your next visit. If any glucose reading is under 80 or above 300 call your primary MD immediately.  If you have Seizure/Convulsions/Epilepsy: Please do not drive, operate heavy machinery, participate in activities at heights or participate in high speed sports until you have seen by Primary MD or a Neurologist and advised to do so again. Per [facility] statutes, patients with seizures are not allowed to drive until they have been  seizure-free for six months.  Use caution when using heavy equipment or power tools. Avoid working on ladders or at heights. Take showers instead of baths. Ensure the water temperature is not too high on the home water heater. Do not go swimming alone. Do not lock yourself in a room alone (i.e. bathroom). When caring for infants or small children, sit down when holding, feeding, or changing them to minimize risk of injury to the child in the event you have a seizure. Maintain good sleep hygiene. Avoid alcohol.   If you had Gastrointestinal Bleeding: Please ask your Primary MD to check a complete blood count within one week of discharge or at your next visit. Your endoscopic/colonoscopic biopsies that are pending at the time of discharge, will also need to followed by your Primary MD.  Get Medicines reviewed and adjusted. Please take all your medications with you for your next visit with your Primary MD  Please request your Primary MD to go over all hospital tests and procedure/radiological results at the follow up, please ask your Primary MD to get all Hospital records sent to his/her office.  If you experience worsening of your admission symptoms, develop shortness of breath, life threatening emergency, suicidal or homicidal thoughts you must seek medical attention immediately by calling 911 or calling your MD immediately  if symptoms less severe.  You must read complete instructions/literature along with all the possible adverse reactions/side effects for all the Medicines you take  and that have been prescribed to you. Take any new Medicines after you have completely understood and accpet all the possible adverse reactions/side effects.   Do not drive or operate heavy machinery when taking Pain medications.   Do not take more than prescribed Pain, Sleep and Anxiety Medications  Special Instructions: If you have smoked or chewed Tobacco  in the last 2 yrs please stop smoking, stop any regular  Alcohol  and or any Recreational drug use.  Wear Seat belts while driving.  Please note You were cared for by a hospitalist during your hospital stay. If you have any questions about your discharge medications or the care you received while you were in the hospital after you are discharged, you can call the unit and asked to speak with the hospitalist on call if the hospitalist that took care of you is not available. Once you are discharged, your primary care physician will handle any further medical issues. Please note that NO REFILLS for any discharge medications will be authorized once you are discharged, as it is imperative that you return to your primary care physician (or establish a relationship with a primary care physician if you do not have one) for your aftercare needs so that they can reassess your need for medications and monitor your lab values.  You can reach the hospitalist office at phone [phone] or fax [phone]   If you do not have a primary care physician, you can call [phone] for a physician referral.  Activity: As tolerated with Full fall precautions use walker/cane & assistance as needed    Diet: regular  Disposition Home

## 2022-02-27 NOTE — Discharge Summary (Signed)
Physician Discharge Summary  JASIAH [name] HEN:[id number] DOB: January 11, 1951 DOA: 02/21/2022  PCP: Tiffany Infante, MD  Admit date: 02/21/2022 Discharge date: 02/27/2022  Admitted From: home Disposition:  home  Recommendations for Outpatient Follow-up:  Follow up with PCP in 1-2 weeks  Home Health: PT Equipment/Devices: home O2  Discharge Condition: stable CODE STATUS: Full code  HPI: Per admitting MD, [name] is a 71 y.o. female with medical history significant for hypertension, insulin-dependent diabetes mellitus, OSA on CPAP, depression, anxiety, ultrasound in August suggestive of cirrhosis, and pancytopenia who presents emergency department with worsening shortness of breath. Patient reports that she was diagnosed with pneumonia and started on antibiotics almost a week ago but has had worsening shortness of breath over the past 2 days with increased cough, sometimes productive. She was too dyspneic to shower yesterday.  She has a pulse oximeter at home and noted her oxygen saturation to be in the 60s yesterday, prompting her presentation to the ED.  Hospital Course / Discharge diagnoses: Principal Problem:   Pneumonia Active Problems:   Insulin dependent type 2 diabetes mellitus (HCC)   Acute respiratory failure with hypoxia (HCC)   Stage 3b chronic kidney disease (CKD) (HCC)   Depression with anxiety   Leukopenia   Principal problem Acute hypoxic respiratory failure due to multifocal pneumonia-patient was profoundly hypoxic, tachypneic on admission requiring heated high flow, currently has been weaned off to room air while at rest and 2 L with ambulation.  She appears comfortable with sats in the upper 90s, able to walk in the hallway without significant difficulties and feels close to baseline.  She will be discharged home in stable condition, recommend to see PCP within 2 weeks for repeat chest x-ray in the clinic ambulatory sats.  She will be discharged on 4 additional  days of antibiotics as well as a prednisone taper.  Active problems Chronic diastolic TIR-4E echo done 12/4 shows LVEF 31-54%, grade 1 diastolic dysfunction.  She appears euvolemic on discharge  CKD 3A-baseline creatinine ranging between 1.0-1.4, currently at baseline Iron deficiency anemia-mild, monitored by hematology as an outpatient Hypertension -continue home regimen on discharge Hyperthyroidism-on PTU Depression, anxiety - Continue home regimen Insulin-dependent DM with steroid-induced hyperglycemia -A1c 5.7. Outpatient follow-up  Sepsis ruled out   Discharge Instructions   Allergies as of 02/27/2022       Reactions   Imitrex [sumatriptan] Other (See Comments)   Vascular spasms   Nsaids Other (See Comments)   PT UNABLE TO TOLERATE NSAID'S DRUGS DUE TO HX OF GASTRIC SLEEVE SURGERY   Statins Other (See Comments)   Leg/muscle pain   Sulfonamide Derivatives Hives   Childhood allergy   Methimazole Hives   Niacin    heart pounding and beating fast.   Codeine Nausea And Vomiting   Promethazine Hcl Other (See Comments)   Restless leg feeling all over body   Vicodin [hydrocodone-acetaminophen] Nausea And Vomiting        Medication List     TAKE these medications    acetaminophen 650 MG CR tablet Commonly known as: TYLENOL Take 1,300 mg by mouth every 8 (eight) hours as needed for pain.   amLODipine 5 MG tablet Commonly known as: NORVASC Take 5 mg by mouth at bedtime.   BD Insulin Syringe U/F 31G X 5/16" 0.5 ML Misc Generic drug: Insulin Syringe-Needle U-100 USE 5 TIMES A DAY LEVEMIR AND NOVOLOG   BD Pen Needle Micro U/F 32G X 6 MM Misc Generic drug: Insulin Pen Needle  Inject 32 pens into the skin daily.   clonazePAM 0.5 MG tablet Commonly known as: KLONOPIN Take 0.5 mg by mouth See admin instructions. Take 0.5 mg at bedtime, may take an additional 0.5 mg tablet twice daily as needed for anxiety   Dialyvite Vitamin D 5000 125 MCG (5000 UT) capsule Generic  drug: Cholecalciferol Take 5,000 Units by mouth daily.   diclofenac 75 MG EC tablet Commonly known as: VOLTAREN Take 75 mg by mouth 2 (two) times daily.   doxazosin 2 MG tablet Commonly known as: CARDURA Take 2 mg by mouth daily.   doxycycline 100 MG tablet Commonly known as: VIBRA-TABS Take 1 tablet (100 mg total) by mouth 2 (two) times daily for 3 days.   fenofibrate 54 MG tablet Take 54 mg by mouth daily with supper.   FLUoxetine 20 MG capsule Commonly known as: PROZAC Take 60 mg by mouth at bedtime.   FREESTYLE TEST STRIPS test strip Generic drug: glucose blood USE STRIPS TO TEST BLOOD GLUCOSE BID   gabapentin 300 MG capsule Commonly known as: NEURONTIN Take 1 capsule (300 mg total) by mouth 3 (three) times daily for 30 days.   hyoscyamine 0.125 MG SL tablet Commonly known as: LEVSIN SL Place 1 tablet (0.125 mg total) under the tongue every 6 (six) hours as needed.   insulin aspart 100 UNIT/ML FlexPen Commonly known as: NOVOLOG Sliding scale dose: CBG < 70: implement hypoglycemia protocol  CBG 70 - 120: 0 units  CBG 121 - 150: 0 units  CBG 151 - 200: 0 units  CBG 201 - 250: 2 units  CBG 251 - 300: 3 units  CBG 301 - 350: 4 units  CBG 351 - 400: 5 units What changed:  how much to take how to take this when to take this reasons to take this additional instructions   insulin detemir 100 UNIT/ML injection Commonly known as: LEVEMIR Inject 25 Units into the skin 2 (two) times daily.   metoprolol tartrate 50 MG tablet Commonly known as: LOPRESSOR Take 50 mg by mouth 3 (three) times daily.   nystatin 100000 UNIT/ML suspension Commonly known as: MYCOSTATIN Use as directed 5 mLs (500,000 Units total) in the mouth or throat 4 (four) times daily for 5 days.   omeprazole 20 MG capsule Commonly known as: PRILOSEC Take 20 mg by mouth daily with supper.   oxybutynin 10 MG 24 hr tablet Commonly known as: DITROPAN-XL Take 10 mg by mouth daily.   Ozempic (1  MG/DOSE) 4 MG/3ML Sopn Generic drug: Semaglutide (1 MG/DOSE) Inject 1 mg into the skin once a week. Thursdays   predniSONE 20 MG tablet Commonly known as: DELTASONE Take 2 tablets (40 mg total) by mouth daily with breakfast for 2 days, THEN 1.5 tablets (30 mg total) daily with breakfast for 2 days, THEN 1 tablet (20 mg total) daily with breakfast for 2 days, THEN 0.5 tablets (10 mg total) daily with breakfast for 2 days. Start taking on: February 27, 2022   propylthiouracil 50 MG tablet Commonly known as: PTU Take 50 mg by mouth daily.   QUEtiapine 50 MG tablet Commonly known as: SEROQUEL Take 50 mg by mouth at bedtime.   rOPINIRole 2 MG tablet Commonly known as: REQUIP Take 2 mg by mouth See admin instructions. Take 2 mg at bedtime, may take a second 2 mg dose as needed for restless legs   Symbicort 160-4.5 MCG/ACT inhaler Generic drug: budesonide-formoterol Inhale 2 puffs into the lungs 2 (two) times daily.  tiZANidine 4 MG tablet Commonly known as: ZANAFLEX Take 4 mg by mouth 2 (two) times daily as needed for muscle spasms.   Unistik 3 Comfort Misc USE LANCETS TO CHECK BLOOD GLUCOSE BID   Ventolin HFA 108 (90 Base) MCG/ACT inhaler Generic drug: albuterol Inhale 2 puffs into the lungs every 4 (four) hours as needed for shortness of breath.               Durable Medical Equipment  (From admission, onward)           Start     Ordered   02/27/22 0911  For home use only DME oxygen  Once       Question Answer Comment  Length of Need 6 Months   Mode or (Route) Nasal cannula   Liters per Minute 2   Frequency Continuous (stationary and portable oxygen unit needed)   Oxygen delivery system Gas      02/27/22 0910   02/26/22 1536  For home use only DME 4 wheeled rolling walker with seat  Once       Question:  Patient needs a walker to treat with the following condition  Answer:  Weakness   02/26/22 1535            Consultations: none  Procedures/Studies:  DG Swallowing Func-Speech Pathology  Result Date: 02/25/2022 Table formatting from the original result was not included. Objective Swallowing Evaluation: Type of Study: MBS-Modified Barium Swallow Study  Patient Details Name: LARAINE SAMET MRN: [id number] Date of Birth: [birth year] Today's Date: 02/25/2022 Time: SLP Start Time (ACUTE ONLY): 74 -SLP Stop Time (ACUTE ONLY): 2671 SLP Time Calculation (min) (ACUTE ONLY): 20 min Past Medical History: Past Medical History: Diagnosis Date  Anemia   Arthritis   Asthma   OCCAS  Bell's palsy   Broken neck (HCC)   C-1  Carbuncle and furuncle of trunk   Chronic dislocation of right shoulder   Depression   Disorder of fascia   HX OF NECROTIC FASCITIS AFTER ABDOMINAL SURGERY FOR HERNIA- REQUIRED 19 SURGERIES AND 2.5 MONTH HOSPITALIZATION AT [facility]  DM (diabetes mellitus) ([place])   Dysrhythmia   HX OF TACHYCARDIA AND BRADYCARDIA - ON GOING FOR YEARS - DOES NOT HAVE TO SEE CARDIOLOGIST  Elevated cholesterol   Fibromyalgia   Fracture MAY 2014  HX OF FRACTURED NECK C1- CAUSES SEVERE HEADACHES--LIMITED ROM NECK  Frequent infections   ESPECIALLY PRONE TO INFECTIONS AFTER SURGERIES  Graves disease   Heart murmur   DOES NOT CAUSE ANY PROBLEMS  History of kidney stones   Hyperlipidemia   Hypertension   Hypothyroidism   GRAVES DISEASE  Migraine   Nephrolithiasis   STAGE 3    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  Neuropathy   Obesity   OSA (obstructive sleep apnea)   USES CPAP - DOES NOT KNOW SETTING  Pain   LEFT SHOULDER  PAIN -HARD TO LIE ON LEFT SIDE FOR LONG PERIOD;  PAIN IN LOWER BADK - 3 HERNIATED DISCS AND STENOISIS -  PONV (postoperative nausea and vomiting)   THE GAS MAKES ME NAUSEATED  Restless leg syndrome   Sciatica   Tachycardia   Urinary frequency   Urticaria   UTI (urinary tract infection)  Past Surgical History: Past Surgical History: Procedure Laterality Date  ABDOMINAL HYSTERECTOMY    LARGE TUMOR AT OVARY REMOVED  ANTERIOR LAT  LUMBAR FUSION N/A 07/28/2018  Procedure: Anterolateral Decompression Lumbar One-Two for osteomyelitis reconstruction w/titanium strut allograft Fusion Lumbar One-Two;  Surgeon: Ellene Route,  Mallie Mussel, MD;  Location: [facility];  Service: Neurosurgery;  Laterality: N/A;  Left anterolateral approach  APPENDECTOMY    APPLICATION OF ROBOTIC ASSISTANCE FOR SPINAL PROCEDURE N/A 10/27/8674  Procedure: APPLICATION OF ROBOTIC ASSISTANCE FOR SPINAL PROCEDURE;  Surgeon: Kristeen Miss, MD;  Location: [facility];  Service: Neurosurgery;  Laterality: N/A;  CARPAL TUNNEL RELEASE    BILATERAL  CATARACT EXTRACTION W/ INTRAOCULAR LENS  IMPLANT, BILATERAL    CESAREAN SECTION    X 3  CHOLECYSTECTOMY    CYSTOSCOPY WITH URETEROSCOPY Right 12/03/2013  Procedure: CYSTOSCOPY WITH RIGHT RETROGRADE URETEROSCOPY LASER LITHOTRIPSY RIGHT STONE RIGHT URETERAL STENT, ;  Surgeon: Raynelle Bring, MD;  Location: [facility];  Service: Urology;  Laterality: Right;  PROCEDURE WAS ORIGINALLY SCHEDULED AS RIGHT PERCUTANEOUS NEPHROLITHOTOMY  EYELID LACERATION REPAIR    RIGHT EYE  HERNIA REPAIR    ABDOMINAL HERNIA REPAIR WITH MESH - 3 SURGERIES   HX OF 19 SURGERIES FOR NECROTIC FASCITIS    SKIN GRAFTS+ WOUND  VAC  I&D OF INFECTED SITE IN [place] - FROM AN INJECTION    IR FLUORO GUIDED NEEDLE PLC ASPIRATION/INJECTION LOC  06/03/2018  JOINT REPLACEMENT    TOTAL RIGHT KNEE REPLACEMENT  KNEE ARTHROSCOPY  RIGHT AND LEFT  X 2  LAPAROSCOPIC GASTRIC SLEEVE RESECTION  2014  NEPHROLITHOTOMY Right 12/10/2013  Procedure: NEPHROLITHOTOMY PERCUTANEOUS;  Surgeon: Raynelle Bring, MD;  Location: [facility];  Service: Urology;  Laterality: Right;  POSTERIOR LUMBAR FUSION 4 LEVEL N/A 07/28/2018  Procedure: Posterior Fixation Thoracic Ten-Lumbar Four with pedicle augmentaion with robotic assistance;  Surgeon: Kristeen Miss, MD;  Location: [facility];  Service: Neurosurgery;  Laterality: N/A;  Posterior Fixation Thoracic Ten-Lumbar Four with pedicle augmentaion with robotic assistance  REVERSE SHOULDER ARTHROPLASTY Right  07/21/2017  REVERSE SHOULDER ARTHROPLASTY Right 07/21/2017  Procedure: RIGHT REVERSE SHOULDER ARTHROPLASTY;  Surgeon: Tania Ade, MD;  Location: [facility];  Service: Orthopedics;  Laterality: Right;  RIGHT FOOT DRAINAGE OF INFECTION    shoulder arthroscopy Right   X 2  TONSILLECTOMY    AND ADENOIDECTOMY HPI: Patient presents with a mild pharyngeal phase dysphagia characterized by decreased laryngeal closure resulting in trace intermittent penetration of thin liquid, never reaching the level of the vocal cords and clearing with subsequent swallows/cued throat clear, and moderate-severe post swallow vallecular residue, primarily with regular texture solids.  Subjective: pleasant, said MD told her she may get to discharge tomorrow  Recommendations for follow up therapy are one component of a multi-disciplinary discharge planning process, led by the attending physician.  Recommendations may be updated based on patient status, additional functional criteria and insurance authorization. Assessment / Plan / Recommendation   02/25/2022   2:20 PM Clinical Impressions Clinical Impression Patient presents with a functional oral phase of swallow and a mild pharyngeal, mild cervical esophageal dysphagia. Barium consistencies tested were: thin liquid, puree solid, mechanical soft solid, 13 mm barium tablet. With thin liquids, swallow initiation delays observed at level of the pyriform sinus and with puree and soft solids, delay was observed at level of vallecular sinus. No aspiration observed with any of the tested barium consistencies but 1-2 instances of very shallow, almost imperceptible penetration observed with thin liquids. 13 mm barium tablet transited without difficulty and no stasis at any phase of swallow. Patient did exhibit mild motility slowing with puree and soft solid texture boluses but no significant PO residuals post initial swallow observed. SLP suspects mildly decreased UES opening and decreased cervical  esophageal transit but this did not significantly impact patient's swallow funciton  or safety. As per this MBS, not likely that her PNA is from oral or pharyngeal swallow function. SLP Visit Diagnosis Dysphagia, pharyngoesophageal phase (R13.14) Impact on safety and function No limitations;Mild aspiration risk     02/25/2022   2:20 PM Treatment Recommendations Treatment Recommendations No treatment recommended at this time     02/25/2022   2:28 PM Prognosis Prognosis for Safe Diet Advancement Good   02/25/2022   2:20 PM Diet Recommendations SLP Diet Recommendations Regular solids;Thin liquid Liquid Administration via Cup;Straw Medication Administration Whole meds with liquid Compensations Slow rate;Small sips/bites Postural Changes Seated upright at 90 degrees     02/25/2022   2:20 PM Other Recommendations Oral Care Recommendations Oral care BID Follow Up Recommendations No SLP follow up Functional Status Assessment Patient has had a recent decline in their functional status and demonstrates the ability to make significant improvements in function in a reasonable and predictable amount of time.   02/24/2022   3:10 PM Frequency and Duration  Speech Therapy Frequency (ACUTE ONLY) min 1 x/week Treatment Duration 1 week     02/25/2022   2:16 PM Oral Phase Oral Phase [facility]    02/25/2022   2:16 PM Pharyngeal Phase Pharyngeal Phase Impaired Pharyngeal- Thin Cup Delayed swallow initiation-vallecula;Delayed swallow initiation-pyriform sinuses Pharyngeal- Thin Straw Delayed swallow initiation-vallecula Pharyngeal- Puree Delayed swallow initiation-vallecula;Reduced pharyngeal peristalsis Pharyngeal- Mechanical Soft Delayed swallow initiation-vallecula;Reduced pharyngeal peristalsis Pharyngeal- Pill [facility]    02/25/2022   2:20 PM Cervical Esophageal Phase  Cervical Esophageal Phase Impaired Cervical Esophageal Comment mildly reduced UES opening Sonia Baller, MA, CCC-SLP Speech Therapy                     DG Chest Port 1 View  Result  Date: 02/23/2022 CLINICAL DATA:  Dyspnea. EXAM: PORTABLE CHEST 1 VIEW COMPARISON:  02/21/2022 FINDINGS: Lordotic technique is demonstrated. Lungs are adequately inflated demonstrate moderate patchy airspace opacification over the right lung most notable over the right upper lobe and minimal patchy density over the left midlung as these findings are not significantly changed. No definite effusion or pneumothorax. Mild stable cardiomegaly. Remainder of the exam is unchanged. IMPRESSION: Moderate patchy airspace process over the right lung and minimal patchy density over the left midlung without significant change and likely due to multifocal infection. Electronically Signed   By: Marin Olp M.D.   On: 02/23/2022 09:32   ECHOCARDIOGRAM COMPLETE  Result Date: 02/22/2022    ECHOCARDIOGRAM REPORT   Patient Name:   MASHA ORBACH Date of Exam: 02/22/2022 Medical Rec #:  [id number]      Height:       60.0 in Accession #:    [id number]     Weight:       225.3 lb Date of Birth:  1951/02/07      BSA:          1.964 m Patient Age:    12 years       BP:           136/55 mmHg Patient Gender: F              HR:           74 bpm. Exam Location:  Inpatient Procedure: 2D Echo, Cardiac Doppler and Color Doppler Indications:    Cardiomyopathy-Unspecified I42.9  History:        Patient has prior history of Echocardiogram examinations, most  recent 06/05/2018. Signs/Symptoms:Chest Pain; Risk                 Factors:Hypertension, Sleep Apnea, Diabetes and Dyslipidemia.  Sonographer:    Ronny Flurry Referring Phys: [id number] ANKIT CHIRAG AMIN IMPRESSIONS  1. Left ventricular ejection fraction, by estimation, is 55 to 60%. The left ventricle has normal function. The left ventricle has no regional wall motion abnormalities. Left ventricular diastolic parameters are consistent with Grade I diastolic dysfunction (impaired relaxation). Elevated left atrial pressure.  2. Right ventricular systolic function is normal. The  right ventricular size is normal.  3. Left atrial size was mildly dilated.  4. The mitral valve is normal in structure. Mild mitral valve regurgitation.  5. The aortic valve is tricuspid. Aortic valve regurgitation is mild.  6. The inferior vena cava is normal in size with greater than 50% respiratory variability, suggesting right atrial pressure of 3 mmHg. Comparison(s): The left ventricular function is unchanged. FINDINGS  Left Ventricle: Left ventricular ejection fraction, by estimation, is 55 to 60%. The left ventricle has normal function. The left ventricle has no regional wall motion abnormalities. The left ventricular internal cavity size was normal in size. There is  no left ventricular hypertrophy. Left ventricular diastolic parameters are consistent with Grade I diastolic dysfunction (impaired relaxation). Elevated left atrial pressure. Right Ventricle: The right ventricular size is normal. Right vetricular wall thickness was not assessed. Right ventricular systolic function is normal. Left Atrium: Left atrial size was mildly dilated. Right Atrium: Right atrial size was normal in size. Pericardium: There is no evidence of pericardial effusion. Mitral Valve: The mitral valve is normal in structure. Mild mitral valve regurgitation. Tricuspid Valve: The tricuspid valve is normal in structure. Tricuspid valve regurgitation is trivial. Aortic Valve: The aortic valve is tricuspid. Aortic valve regurgitation is mild. Aortic valve mean gradient measures 11.0 mmHg. Aortic valve peak gradient measures 21.0 mmHg. Aortic valve area, by VTI measures 2.18 cm. Pulmonic Valve: The pulmonic valve was normal in structure. Pulmonic valve regurgitation is not visualized. Aorta: The aortic root is normal in size and structure. Venous: The inferior vena cava is normal in size with greater than 50% respiratory variability, suggesting right atrial pressure of 3 mmHg. IAS/Shunts: No atrial level shunt detected by color flow  Doppler.  LEFT VENTRICLE PLAX 2D LVIDd:         5.10 cm   Diastology LVIDs:         3.80 cm   LV e' medial:    5.66 cm/s LV PW:         1.10 cm   LV E/e' medial:  23.0 LV IVS:        1.10 cm   LV e' lateral:   6.74 cm/s LVOT diam:     2.00 cm   LV E/e' lateral: 19.3 LV SV:         108 LV SV Index:   55 LVOT Area:     3.14 cm  RIGHT VENTRICLE RV S prime:     12.50 cm/s TAPSE (M-mode): 2.0 cm LEFT ATRIUM             Index        RIGHT ATRIUM           Index LA diam:        3.90 cm 1.99 cm/m   RA Area:     15.20 cm LA Vol (A2C):   79.6 ml 40.54 ml/m  RA Volume:   36.10 ml  18.38 ml/m LA Vol (A4C):   81.0 ml 41.25 ml/m LA Biplane Vol: 80.7 ml 41.10 ml/m  AORTIC VALVE AV Area (Vmax):    2.19 cm AV Area (Vmean):   2.22 cm AV Area (VTI):     2.18 cm AV Vmax:           229.00 cm/s AV Vmean:          156.000 cm/s AV VTI:            0.497 m AV Peak Grad:      21.0 mmHg AV Mean Grad:      11.0 mmHg LVOT Vmax:         160.00 cm/s LVOT Vmean:        110.000 cm/s LVOT VTI:          0.345 m LVOT/AV VTI ratio: 0.69  AORTA Ao Root diam: 3.00 cm MITRAL VALVE MV Area (PHT): 3.08 cm     SHUNTS MV Decel Time: 246 msec     Systemic VTI:  0.34 m MV E velocity: 130.00 cm/s  Systemic Diam: 2.00 cm MV A velocity: 151.00 cm/s MV E/A ratio:  0.86 Dorris Carnes MD Electronically signed by Dorris Carnes MD Signature Date/Time: 02/22/2022/4:56:44 PM    Final    CT Angio Chest PE W and/or Wo Contrast  Result Date: 02/22/2022 CLINICAL DATA:  Pulmonary embolism (PE) suspected, high prob. Difficulty breathing. EXAM: CT ANGIOGRAPHY CHEST WITH CONTRAST TECHNIQUE: Multidetector CT imaging of the chest was performed using the standard protocol during bolus administration of intravenous contrast. Multiplanar CT image reconstructions and MIPs were obtained to evaluate the vascular anatomy. RADIATION DOSE REDUCTION: This exam was performed according to the departmental dose-optimization program which includes automated exposure control, adjustment  of the mA and/or kV according to patient size and/or use of iterative reconstruction technique. CONTRAST:  3m OMNIPAQUE IOHEXOL 350 MG/ML SOLN COMPARISON:  11/17/2020 FINDINGS: Cardiovascular: No filling defects in the pulmonary arteries to suggest pulmonary emboli. Heart is normal size. Aorta is normal caliber. Mediastinum/Nodes: Mildly prominent mediastinal lymph nodes. No axillary or hilar adenopathy. Lungs/Pleura: Extensive bilateral airspace disease, right greater than left. Trace bilateral effusions. Upper Abdomen: No acute findings Musculoskeletal: Chest wall soft tissues are unremarkable. No acute bony abnormality. Review of the MIP images confirms the above findings. IMPRESSION: No evidence of pulmonary embolus. Diffuse bilateral airspace disease, left greater than right most compatible with pneumonia. Trace bilateral effusions. Electronically Signed   By: KRolm BaptiseM.D.   On: 02/22/2022 02:11   DG Chest 2 View  Result Date: 02/21/2022 CLINICAL DATA:  Hypoxia and shortness of breath.  Pneumonia. EXAM: CHEST - 2 VIEW COMPARISON:  Two-view chest x-ray 01/08/2017 FINDINGS: Heart is enlarged. Asymmetric interstitial and airspace opacities are present in the right lung. Less prominent airspace disease in the left is worse superiorly. No definite effusions are present. Right shoulder arthroplasty noted. Progressive advanced degenerative changes are present in the left shoulder. IMPRESSION: 1. Asymmetric interstitial and airspace opacities, right greater than left. This is concerning for multifocal pneumonia. 2. Cardiomegaly without failure. Electronically Signed   By: CSan MorelleM.D.   On: 02/21/2022 17:48     Subjective: - no chest pain, shortness of breath, no abdominal pain, nausea or vomiting.   Discharge Exam: BP 100/77 (BP Location: Right Wrist)   Pulse 72   Temp 97.8 F (36.6 C) (Oral)   Resp 19   Ht 5' (1.524 m)   Wt 96.4 kg   SpO2 97%  BMI 41.51 kg/m   General: Pt is  alert, awake, not in acute distress Cardiovascular: RRR, S1/S2 +, no rubs, no gallops Respiratory: CTA bilaterally, no wheezing, no rhonchi Abdominal: Soft, NT, ND, bowel sounds + Extremities: no edema, no cyanosis   The results of significant diagnostics from this hospitalization (including imaging, microbiology, ancillary and laboratory) are listed below for reference.     Microbiology: Recent Results (from the past 240 hour(s))  Resp Panel by RT-PCR (Flu A&B, Covid) Anterior Nasal Swab     Status: None   Collection Time: 02/21/22  5:15 PM   Specimen: Anterior Nasal Swab  Result Value Ref Range Status   SARS Coronavirus 2 by RT PCR NEGATIVE NEGATIVE Final    Comment: (NOTE) SARS-CoV-2 target nucleic acids are NOT DETECTED.  The SARS-CoV-2 RNA is generally detectable in upper respiratory specimens during the acute phase of infection. The lowest concentration of SARS-CoV-2 viral copies this assay can detect is 138 copies/mL. A negative result does not preclude SARS-Cov-2 infection and should not be used as the sole basis for treatment or other patient management decisions. A negative result may occur with  improper specimen collection/handling, submission of specimen other than nasopharyngeal swab, presence of viral mutation(s) within the areas targeted by this assay, and inadequate number of viral copies(<138 copies/mL). A negative result must be combined with clinical observations, patient history, and epidemiological information. The expected result is Negative.  Fact Sheet for Patients:  EntrepreneurPulse.com.au  Fact Sheet for Healthcare Providers:  IncredibleEmployment.be  This test is no t yet approved or cleared by the [place] FDA and  has been authorized for detection and/or diagnosis of SARS-CoV-2 by FDA under an Emergency Use Authorization (EUA). This EUA will remain  in effect (meaning this test can be used) for the  duration of the COVID-19 declaration under Section 564(b)(1) of the Act, 21 U.S.C.section 360bbb-3(b)(1), unless the authorization is terminated  or revoked sooner.       Influenza A by PCR NEGATIVE NEGATIVE Final   Influenza B by PCR NEGATIVE NEGATIVE Final    Comment: (NOTE) The Xpert Xpress SARS-CoV-2/FLU/RSV plus assay is intended as an aid in the diagnosis of influenza from Nasopharyngeal swab specimens and should not be used as a sole basis for treatment. Nasal washings and aspirates are unacceptable for Xpert Xpress SARS-CoV-2/FLU/RSV testing.  Fact Sheet for Patients: EntrepreneurPulse.com.au  Fact Sheet for Healthcare Providers: IncredibleEmployment.be  This test is not yet approved or cleared by the [place] FDA and has been authorized for detection and/or diagnosis of SARS-CoV-2 by FDA under an Emergency Use Authorization (EUA). This EUA will remain in effect (meaning this test can be used) for the duration of the COVID-19 declaration under Section 564(b)(1) of the Act, 21 U.S.C. section 360bbb-3(b)(1), unless the authorization is terminated or revoked.  Performed at [facility], [place] [address], [place], [place] [address]   MRSA Next Gen by PCR, Nasal     Status: None   Collection Time: 02/21/22 11:46 PM   Specimen: Nasal Mucosa; Nasal Swab  Result Value Ref Range Status   MRSA by PCR Next Gen NOT DETECTED NOT DETECTED Final    Comment: (NOTE) The GeneXpert MRSA Assay (FDA approved for NASAL specimens only), is one component of a comprehensive MRSA colonization surveillance program. It is not intended to diagnose MRSA infection nor to guide or monitor treatment for MRSA infections. Test performance is not FDA approved in patients less than 85 years old. Performed at [facility]  [facility], [place] [address], [place], [place] [address]   Expectorated Sputum Assessment w Gram Stain, Rflx to Resp Cult     Status: None    Collection Time: 02/22/22  3:00 AM   Specimen: Expectorated Sputum  Result Value Ref Range Status   Specimen Description EXPECTORATED SPUTUM  Final   Special Requests NONE  Final   Sputum evaluation   Final    THIS SPECIMEN IS ACCEPTABLE FOR SPUTUM CULTURE Performed at [facility], [place] [address], [place], [place] [address]    Report Status 02/22/2022 FINAL  Final  Culture, Respiratory w Gram Stain     Status: None   Collection Time: 02/22/22  3:00 AM  Result Value Ref Range Status   Specimen Description EXPECTORATED SPUTUM  Final   Special Requests NONE Reflexed from [id number]  Final   Gram Stain   Final    RARE WBC PRESENT, PREDOMINANTLY PMN RARE BUDDING YEAST SEEN Performed at [facility], [place] [address], [place], [place] [address]    Culture ABUNDANT CANDIDA ALBICANS  Final   Report Status 02/25/2022 FINAL  Final  Respiratory (~20 pathogens) panel by PCR     Status: None   Collection Time: 02/22/22 10:34 AM   Specimen: Nasopharyngeal Swab; Respiratory  Result Value Ref Range Status   Adenovirus NOT DETECTED NOT DETECTED Final   Coronavirus 229E NOT DETECTED NOT DETECTED Final    Comment: (NOTE) The Coronavirus on the Respiratory Panel, DOES NOT test for the novel  Coronavirus (2019 nCoV)    Coronavirus HKU1 NOT DETECTED NOT DETECTED Final   Coronavirus NL63 NOT DETECTED NOT DETECTED Final   Coronavirus OC43 NOT DETECTED NOT DETECTED Final   Metapneumovirus NOT DETECTED NOT DETECTED Final   Rhinovirus / Enterovirus NOT DETECTED NOT DETECTED Final   Influenza A NOT DETECTED NOT DETECTED Final   Influenza B NOT DETECTED NOT DETECTED Final   Parainfluenza Virus 1 NOT DETECTED NOT DETECTED Final   Parainfluenza Virus 2 NOT DETECTED NOT DETECTED Final   Parainfluenza Virus 3 NOT DETECTED NOT DETECTED Final   Parainfluenza Virus 4 NOT DETECTED NOT DETECTED Final   Respiratory Syncytial Virus NOT DETECTED NOT DETECTED Final   Bordetella pertussis NOT DETECTED NOT  DETECTED Final   Bordetella Parapertussis NOT DETECTED NOT DETECTED Final   Chlamydophila pneumoniae NOT DETECTED NOT DETECTED Final   Mycoplasma pneumoniae NOT DETECTED NOT DETECTED Final    Comment: Performed at [facility], [place], [place], [place] [address]     Labs: Basic Metabolic Panel: Recent Labs  Lab 02/23/22 0425 02/24/22 0016 02/25/22 0033 02/26/22 0041 02/27/22 0016  NA 144 135 137 136 137  K 3.5 3.8 4.1 3.8 4.7  CL 102 95* 95* 97* 96*  CO2 '28 30 31 29 30  '$ GLUCOSE 70 285* 218* 90 253*  BUN 16 24* 31* 33* 37*  CREATININE 1.30* 1.51* 1.32* 1.16* 1.30*  CALCIUM 9.3 8.9 9.5 9.0 9.4  MG 1.9 1.8 2.0 2.0 2.2   Liver Function Tests: Recent Labs  Lab 02/21/22 1715 02/25/22 0033  AST 18 15  ALT 12 12  ALKPHOS 76 69  BILITOT 0.5 0.4  PROT 6.4* 6.7  ALBUMIN 2.5* 2.5*   CBC: Recent Labs  Lab 02/21/22 1715 02/22/22 0306 02/23/22 0425 02/24/22 0016 02/25/22 0033 02/26/22 0041 02/27/22 0016  WBC 3.9*   < > 2.9* 2.1* 3.6* 3.0* 2.7*  NEUTROABS 3.4  --   --   --   --   --   --  HGB 10.6*   < > 9.5* 10.4* 10.5* 10.2* 10.6*  HCT 34.0*   < > 29.9* 31.2* 31.9* 30.5* 32.0*  MCV 87.6   < > 86.9 83.4 82.9 83.6 82.9  PLT 183   < > 170 169 194 176 168   < > = values in this interval not displayed.   CBG: Recent Labs  Lab 02/26/22 1128 02/26/22 1224 02/26/22 1641 02/26/22 2116 02/27/22 0618  GLUCAP 66* 103* 206* 246* 111*   Hgb A1c No results for input(s): "HGBA1C" in the last 72 hours. Lipid Profile No results for input(s): "CHOL", "HDL", "LDLCALC", "TRIG", "CHOLHDL", "LDLDIRECT" in the last 72 hours. Thyroid function studies No results for input(s): "TSH", "T4TOTAL", "T3FREE", "THYROIDAB" in the last 72 hours.  Invalid input(s): "FREET3" Urinalysis    Component Value Date/Time   COLORURINE YELLOW 02/23/2022 [place] 02/23/2022 1656   LABSPEC 1.012 02/23/2022 1656   PHURINE 5.0 02/23/2022 1656   GLUCOSEU NEGATIVE  02/23/2022 1656   HGBUR NEGATIVE 02/23/2022 1656   BILIRUBINUR NEGATIVE 02/23/2022 1656   KETONESUR NEGATIVE 02/23/2022 1656   PROTEINUR NEGATIVE 02/23/2022 1656   UROBILINOGEN 0.2 07/20/2014 1707   NITRITE NEGATIVE 02/23/2022 1656   LEUKOCYTESUR NEGATIVE 02/23/2022 1656    FURTHER DISCHARGE INSTRUCTIONS:   Get Medicines reviewed and adjusted: Please take all your medications with you for your next visit with your Primary MD   Laboratory/radiological data: Please request your Primary MD to go over all hospital tests and procedure/radiological results at the follow up, please ask your Primary MD to get all Hospital records sent to his/her office.   In some cases, they will be blood work, cultures and biopsy results pending at the time of your discharge. Please request that your primary care M.D. goes through all the records of your hospital data and follows up on these results.   Also Note the following: If you experience worsening of your admission symptoms, develop shortness of breath, life threatening emergency, suicidal or homicidal thoughts you must seek medical attention immediately by calling 911 or calling your MD immediately  if symptoms less severe.   You must read complete instructions/literature along with all the possible adverse reactions/side effects for all the Medicines you take and that have been prescribed to you. Take any new Medicines after you have completely understood and accpet all the possible adverse reactions/side effects.    Do not drive when taking Pain medications or sleeping medications (Benzodaizepines)   Do not take more than prescribed Pain, Sleep and Anxiety Medications. It is not advisable to combine anxiety,sleep and pain medications without talking with your primary care practitioner   Special Instructions: If you have smoked or chewed Tobacco  in the last 2 yrs please stop smoking, stop any regular Alcohol  and or any Recreational drug use.   Wear  Seat belts while driving.   Please note: You were cared for by a hospitalist during your hospital stay. Once you are discharged, your primary care physician will handle any further medical issues. Please note that NO REFILLS for any discharge medications will be authorized once you are discharged, as it is imperative that you return to your primary care physician (or establish a relationship with a primary care physician if you do not have one) for your post hospital discharge needs so that they can reassess your need for medications and monitor your lab values.  Time coordinating discharge: 40 minutes  SIGNED:  Marzetta Board, MD, PhD 02/27/2022, 9:13 AM

## 2022-03-01 DIAGNOSIS — I130 Hypertensive heart and chronic kidney disease with heart failure and stage 1 through stage 4 chronic kidney disease, or unspecified chronic kidney disease: Secondary | ICD-10-CM | POA: Diagnosis not present

## 2022-03-01 DIAGNOSIS — D631 Anemia in chronic kidney disease: Secondary | ICD-10-CM | POA: Diagnosis not present

## 2022-03-01 DIAGNOSIS — E0500 Thyrotoxicosis with diffuse goiter without thyrotoxic crisis or storm: Secondary | ICD-10-CM | POA: Diagnosis not present

## 2022-03-01 DIAGNOSIS — F418 Other specified anxiety disorders: Secondary | ICD-10-CM | POA: Diagnosis not present

## 2022-03-01 DIAGNOSIS — R1314 Dysphagia, pharyngoesophageal phase: Secondary | ICD-10-CM | POA: Diagnosis not present

## 2022-03-01 DIAGNOSIS — J45909 Unspecified asthma, uncomplicated: Secondary | ICD-10-CM | POA: Diagnosis not present

## 2022-03-01 DIAGNOSIS — J189 Pneumonia, unspecified organism: Secondary | ICD-10-CM | POA: Diagnosis not present

## 2022-03-01 DIAGNOSIS — E1122 Type 2 diabetes mellitus with diabetic chronic kidney disease: Secondary | ICD-10-CM | POA: Diagnosis not present

## 2022-03-01 DIAGNOSIS — E785 Hyperlipidemia, unspecified: Secondary | ICD-10-CM | POA: Diagnosis not present

## 2022-03-01 DIAGNOSIS — D509 Iron deficiency anemia, unspecified: Secondary | ICD-10-CM | POA: Diagnosis not present

## 2022-03-01 DIAGNOSIS — E039 Hypothyroidism, unspecified: Secondary | ICD-10-CM | POA: Diagnosis not present

## 2022-03-01 DIAGNOSIS — G43909 Migraine, unspecified, not intractable, without status migrainosus: Secondary | ICD-10-CM | POA: Diagnosis not present

## 2022-03-01 DIAGNOSIS — N1832 Chronic kidney disease, stage 3b: Secondary | ICD-10-CM | POA: Diagnosis not present

## 2022-03-01 DIAGNOSIS — I080 Rheumatic disorders of both mitral and aortic valves: Secondary | ICD-10-CM | POA: Diagnosis not present

## 2022-03-01 DIAGNOSIS — I5032 Chronic diastolic (congestive) heart failure: Secondary | ICD-10-CM | POA: Diagnosis not present

## 2022-03-01 DIAGNOSIS — M797 Fibromyalgia: Secondary | ICD-10-CM | POA: Diagnosis not present

## 2022-03-01 DIAGNOSIS — G4733 Obstructive sleep apnea (adult) (pediatric): Secondary | ICD-10-CM | POA: Diagnosis not present

## 2022-03-01 DIAGNOSIS — E1140 Type 2 diabetes mellitus with diabetic neuropathy, unspecified: Secondary | ICD-10-CM | POA: Diagnosis not present

## 2022-03-01 DIAGNOSIS — R011 Cardiac murmur, unspecified: Secondary | ICD-10-CM | POA: Diagnosis not present

## 2022-03-01 DIAGNOSIS — J9601 Acute respiratory failure with hypoxia: Secondary | ICD-10-CM | POA: Diagnosis not present

## 2022-03-01 DIAGNOSIS — M1990 Unspecified osteoarthritis, unspecified site: Secondary | ICD-10-CM | POA: Diagnosis not present

## 2022-03-01 DIAGNOSIS — I429 Cardiomyopathy, unspecified: Secondary | ICD-10-CM | POA: Diagnosis not present

## 2022-03-01 DIAGNOSIS — E669 Obesity, unspecified: Secondary | ICD-10-CM | POA: Diagnosis not present

## 2022-03-01 DIAGNOSIS — E1165 Type 2 diabetes mellitus with hyperglycemia: Secondary | ICD-10-CM | POA: Diagnosis not present

## 2022-03-01 DIAGNOSIS — I495 Sick sinus syndrome: Secondary | ICD-10-CM | POA: Diagnosis not present

## 2022-03-02 DIAGNOSIS — E1165 Type 2 diabetes mellitus with hyperglycemia: Secondary | ICD-10-CM | POA: Diagnosis not present

## 2022-03-02 DIAGNOSIS — I5032 Chronic diastolic (congestive) heart failure: Secondary | ICD-10-CM | POA: Diagnosis not present

## 2022-03-02 DIAGNOSIS — J189 Pneumonia, unspecified organism: Secondary | ICD-10-CM | POA: Diagnosis not present

## 2022-03-02 DIAGNOSIS — I130 Hypertensive heart and chronic kidney disease with heart failure and stage 1 through stage 4 chronic kidney disease, or unspecified chronic kidney disease: Secondary | ICD-10-CM | POA: Diagnosis not present

## 2022-03-02 DIAGNOSIS — J9601 Acute respiratory failure with hypoxia: Secondary | ICD-10-CM | POA: Diagnosis not present

## 2022-03-02 DIAGNOSIS — E1122 Type 2 diabetes mellitus with diabetic chronic kidney disease: Secondary | ICD-10-CM | POA: Diagnosis not present

## 2022-03-05 DIAGNOSIS — J189 Pneumonia, unspecified organism: Secondary | ICD-10-CM | POA: Diagnosis not present

## 2022-03-05 DIAGNOSIS — E1165 Type 2 diabetes mellitus with hyperglycemia: Secondary | ICD-10-CM | POA: Diagnosis not present

## 2022-03-05 DIAGNOSIS — I5032 Chronic diastolic (congestive) heart failure: Secondary | ICD-10-CM | POA: Diagnosis not present

## 2022-03-05 DIAGNOSIS — J9601 Acute respiratory failure with hypoxia: Secondary | ICD-10-CM | POA: Diagnosis not present

## 2022-03-05 DIAGNOSIS — E1122 Type 2 diabetes mellitus with diabetic chronic kidney disease: Secondary | ICD-10-CM | POA: Diagnosis not present

## 2022-03-05 DIAGNOSIS — I130 Hypertensive heart and chronic kidney disease with heart failure and stage 1 through stage 4 chronic kidney disease, or unspecified chronic kidney disease: Secondary | ICD-10-CM | POA: Diagnosis not present

## 2022-03-08 DIAGNOSIS — I5032 Chronic diastolic (congestive) heart failure: Secondary | ICD-10-CM | POA: Diagnosis not present

## 2022-03-08 DIAGNOSIS — E1165 Type 2 diabetes mellitus with hyperglycemia: Secondary | ICD-10-CM | POA: Diagnosis not present

## 2022-03-08 DIAGNOSIS — I130 Hypertensive heart and chronic kidney disease with heart failure and stage 1 through stage 4 chronic kidney disease, or unspecified chronic kidney disease: Secondary | ICD-10-CM | POA: Diagnosis not present

## 2022-03-08 DIAGNOSIS — J189 Pneumonia, unspecified organism: Secondary | ICD-10-CM | POA: Diagnosis not present

## 2022-03-08 DIAGNOSIS — E1122 Type 2 diabetes mellitus with diabetic chronic kidney disease: Secondary | ICD-10-CM | POA: Diagnosis not present

## 2022-03-08 DIAGNOSIS — J9601 Acute respiratory failure with hypoxia: Secondary | ICD-10-CM | POA: Diagnosis not present

## 2022-03-10 ENCOUNTER — Inpatient Hospital Stay (HOSPITAL_COMMUNITY)
Admission: EM | Admit: 2022-03-10 | Discharge: 2022-03-14 | DRG: 683 | Disposition: A | Discharge status: Home Health | Payer: Medicare Other | Attending: Internal Medicine | Admitting: Internal Medicine

## 2022-03-10 ENCOUNTER — Encounter (HOSPITAL_COMMUNITY): Payer: Self-pay | Admitting: Oncology

## 2022-03-10 ENCOUNTER — Emergency Department (HOSPITAL_COMMUNITY): Payer: Medicare Other

## 2022-03-10 ENCOUNTER — Other Ambulatory Visit: Payer: Self-pay

## 2022-03-10 DIAGNOSIS — F418 Other specified anxiety disorders: Secondary | ICD-10-CM | POA: Diagnosis present

## 2022-03-10 DIAGNOSIS — E039 Hypothyroidism, unspecified: Secondary | ICD-10-CM | POA: Diagnosis present

## 2022-03-10 DIAGNOSIS — Z961 Presence of intraocular lens: Secondary | ICD-10-CM | POA: Diagnosis present

## 2022-03-10 DIAGNOSIS — N1831 Chronic kidney disease, stage 3a: Secondary | ICD-10-CM | POA: Insufficient documentation

## 2022-03-10 DIAGNOSIS — E860 Dehydration: Secondary | ICD-10-CM | POA: Insufficient documentation

## 2022-03-10 DIAGNOSIS — I517 Cardiomegaly: Secondary | ICD-10-CM | POA: Diagnosis not present

## 2022-03-10 DIAGNOSIS — Z794 Long term (current) use of insulin: Secondary | ICD-10-CM

## 2022-03-10 DIAGNOSIS — E1122 Type 2 diabetes mellitus with diabetic chronic kidney disease: Secondary | ICD-10-CM | POA: Diagnosis present

## 2022-03-10 DIAGNOSIS — G2581 Restless legs syndrome: Secondary | ICD-10-CM | POA: Diagnosis present

## 2022-03-10 DIAGNOSIS — E1169 Type 2 diabetes mellitus with other specified complication: Secondary | ICD-10-CM | POA: Diagnosis not present

## 2022-03-10 DIAGNOSIS — E8809 Other disorders of plasma-protein metabolism, not elsewhere classified: Secondary | ICD-10-CM | POA: Diagnosis present

## 2022-03-10 DIAGNOSIS — F419 Anxiety disorder, unspecified: Secondary | ICD-10-CM | POA: Diagnosis present

## 2022-03-10 DIAGNOSIS — I129 Hypertensive chronic kidney disease with stage 1 through stage 4 chronic kidney disease, or unspecified chronic kidney disease: Secondary | ICD-10-CM | POA: Diagnosis present

## 2022-03-10 DIAGNOSIS — M797 Fibromyalgia: Secondary | ICD-10-CM | POA: Diagnosis present

## 2022-03-10 DIAGNOSIS — Z79899 Other long term (current) drug therapy: Secondary | ICD-10-CM

## 2022-03-10 DIAGNOSIS — R197 Diarrhea, unspecified: Secondary | ICD-10-CM | POA: Diagnosis present

## 2022-03-10 DIAGNOSIS — J189 Pneumonia, unspecified organism: Secondary | ICD-10-CM | POA: Diagnosis not present

## 2022-03-10 DIAGNOSIS — Z87442 Personal history of urinary calculi: Secondary | ICD-10-CM

## 2022-03-10 DIAGNOSIS — E861 Hypovolemia: Secondary | ICD-10-CM | POA: Diagnosis present

## 2022-03-10 DIAGNOSIS — E7800 Pure hypercholesterolemia, unspecified: Secondary | ICD-10-CM | POA: Diagnosis present

## 2022-03-10 DIAGNOSIS — J849 Interstitial pulmonary disease, unspecified: Secondary | ICD-10-CM | POA: Diagnosis not present

## 2022-03-10 DIAGNOSIS — I959 Hypotension, unspecified: Secondary | ICD-10-CM | POA: Insufficient documentation

## 2022-03-10 DIAGNOSIS — Z6841 Body Mass Index (BMI) 40.0 and over, adult: Secondary | ICD-10-CM | POA: Diagnosis not present

## 2022-03-10 DIAGNOSIS — N179 Acute kidney failure, unspecified: Secondary | ICD-10-CM | POA: Diagnosis not present

## 2022-03-10 DIAGNOSIS — I9589 Other hypotension: Secondary | ICD-10-CM | POA: Diagnosis not present

## 2022-03-10 DIAGNOSIS — Z96651 Presence of right artificial knee joint: Secondary | ICD-10-CM | POA: Diagnosis present

## 2022-03-10 DIAGNOSIS — F32A Depression, unspecified: Secondary | ICD-10-CM | POA: Diagnosis present

## 2022-03-10 DIAGNOSIS — Z7985 Long-term (current) use of injectable non-insulin antidiabetic drugs: Secondary | ICD-10-CM

## 2022-03-10 DIAGNOSIS — E785 Hyperlipidemia, unspecified: Secondary | ICD-10-CM | POA: Diagnosis present

## 2022-03-10 DIAGNOSIS — J45909 Unspecified asthma, uncomplicated: Secondary | ICD-10-CM | POA: Diagnosis present

## 2022-03-10 DIAGNOSIS — Z96611 Presence of right artificial shoulder joint: Secondary | ICD-10-CM | POA: Diagnosis present

## 2022-03-10 DIAGNOSIS — D649 Anemia, unspecified: Secondary | ICD-10-CM | POA: Diagnosis present

## 2022-03-10 DIAGNOSIS — Z7951 Long term (current) use of inhaled steroids: Secondary | ICD-10-CM

## 2022-03-10 DIAGNOSIS — I10 Essential (primary) hypertension: Secondary | ICD-10-CM | POA: Diagnosis present

## 2022-03-10 DIAGNOSIS — Z888 Allergy status to other drugs, medicaments and biological substances status: Secondary | ICD-10-CM

## 2022-03-10 DIAGNOSIS — Z9842 Cataract extraction status, left eye: Secondary | ICD-10-CM

## 2022-03-10 DIAGNOSIS — Z882 Allergy status to sulfonamides status: Secondary | ICD-10-CM

## 2022-03-10 DIAGNOSIS — D72819 Decreased white blood cell count, unspecified: Secondary | ICD-10-CM | POA: Diagnosis present

## 2022-03-10 DIAGNOSIS — E669 Obesity, unspecified: Secondary | ICD-10-CM | POA: Diagnosis present

## 2022-03-10 DIAGNOSIS — E0500 Thyrotoxicosis with diffuse goiter without thyrotoxic crisis or storm: Secondary | ICD-10-CM | POA: Diagnosis present

## 2022-03-10 DIAGNOSIS — K219 Gastro-esophageal reflux disease without esophagitis: Secondary | ICD-10-CM | POA: Diagnosis present

## 2022-03-10 DIAGNOSIS — E0590 Thyrotoxicosis, unspecified without thyrotoxic crisis or storm: Secondary | ICD-10-CM | POA: Diagnosis present

## 2022-03-10 DIAGNOSIS — G4733 Obstructive sleep apnea (adult) (pediatric): Secondary | ICD-10-CM | POA: Diagnosis present

## 2022-03-10 DIAGNOSIS — Z9841 Cataract extraction status, right eye: Secondary | ICD-10-CM

## 2022-03-10 DIAGNOSIS — R531 Weakness: Secondary | ICD-10-CM | POA: Diagnosis not present

## 2022-03-10 DIAGNOSIS — Z885 Allergy status to narcotic agent status: Secondary | ICD-10-CM

## 2022-03-10 DIAGNOSIS — Z833 Family history of diabetes mellitus: Secondary | ICD-10-CM

## 2022-03-10 DIAGNOSIS — Z8249 Family history of ischemic heart disease and other diseases of the circulatory system: Secondary | ICD-10-CM

## 2022-03-10 DIAGNOSIS — Z886 Allergy status to analgesic agent status: Secondary | ICD-10-CM

## 2022-03-10 DIAGNOSIS — Z90710 Acquired absence of both cervix and uterus: Secondary | ICD-10-CM

## 2022-03-10 LAB — CBC WITH DIFFERENTIAL/PLATELET
Abs Immature Granulocytes: 0.1 10*3/uL — ABNORMAL HIGH (ref 0.00–0.07)
Basophils Absolute: 0 10*3/uL (ref 0.0–0.1)
Basophils Relative: 1 %
Eosinophils Absolute: 0 10*3/uL (ref 0.0–0.5)
Eosinophils Relative: 0 %
HCT: 30 % — ABNORMAL LOW (ref 36.0–46.0)
Hemoglobin: 9.1 g/dL — ABNORMAL LOW (ref 12.0–15.0)
Immature Granulocytes: 3 %
Lymphocytes Relative: 11 %
Lymphs Abs: 0.3 10*3/uL — ABNORMAL LOW (ref 0.7–4.0)
MCH: 27.2 pg (ref 26.0–34.0)
MCHC: 30.3 g/dL (ref 30.0–36.0)
MCV: 89.6 fL (ref 80.0–100.0)
Monocytes Absolute: 0.2 10*3/uL (ref 0.1–1.0)
Monocytes Relative: 6 %
Neutro Abs: 2.4 10*3/uL (ref 1.7–7.7)
Neutrophils Relative %: 79 %
Platelets: 114 10*3/uL — ABNORMAL LOW (ref 150–400)
RBC: 3.35 MIL/uL — ABNORMAL LOW (ref 3.87–5.11)
RDW: 15.9 % — ABNORMAL HIGH (ref 11.5–15.5)
WBC: 3 10*3/uL — ABNORMAL LOW (ref 4.0–10.5)
nRBC: 0 % (ref 0.0–0.2)

## 2022-03-10 LAB — COMPREHENSIVE METABOLIC PANEL
ALT: 13 U/L (ref 0–44)
AST: 15 U/L (ref 15–41)
Albumin: 2.1 g/dL — ABNORMAL LOW (ref 3.5–5.0)
Alkaline Phosphatase: 102 U/L (ref 38–126)
Anion gap: 8 (ref 5–15)
BUN: 66 mg/dL — ABNORMAL HIGH (ref 8–23)
CO2: 20 mmol/L — ABNORMAL LOW (ref 22–32)
Calcium: 8 mg/dL — ABNORMAL LOW (ref 8.9–10.3)
Chloride: 107 mmol/L (ref 98–111)
Creatinine, Ser: 2.08 mg/dL — ABNORMAL HIGH (ref 0.44–1.00)
GFR, Estimated: 25 mL/min — ABNORMAL LOW (ref >60)
Glucose, Bld: 210 mg/dL — ABNORMAL HIGH (ref 70–99)
Potassium: 4.7 mmol/L (ref 3.5–5.1)
Sodium: 135 mmol/L (ref 135–145)
Total Bilirubin: 0.7 mg/dL (ref 0.3–1.2)
Total Protein: 4.9 g/dL — ABNORMAL LOW (ref 6.5–8.1)

## 2022-03-10 LAB — I-STAT CHEM 8, ED
BUN: 70 mg/dL — ABNORMAL HIGH (ref 8–23)
Calcium, Ion: 1.06 mmol/L — ABNORMAL LOW (ref 1.15–1.40)
Chloride: 106 mmol/L (ref 98–111)
Creatinine, Ser: 2.2 mg/dL — ABNORMAL HIGH (ref 0.44–1.00)
Glucose, Bld: 206 mg/dL — ABNORMAL HIGH (ref 70–99)
HCT: 26 % — ABNORMAL LOW (ref 36.0–46.0)
Hemoglobin: 8.8 g/dL — ABNORMAL LOW (ref 12.0–15.0)
Potassium: 5 mmol/L (ref 3.5–5.1)
Sodium: 135 mmol/L (ref 135–145)
TCO2: 20 mmol/L — ABNORMAL LOW (ref 22–32)

## 2022-03-10 LAB — BLOOD GAS, VENOUS
Acid-base deficit: 3.8 mmol/L — ABNORMAL HIGH (ref 0.0–2.0)
Bicarbonate: 21.5 mmol/L (ref 20.0–28.0)
O2 Saturation: 86 %
Patient temperature: 37
pCO2, Ven: 39 mmHg — ABNORMAL LOW (ref 44–60)
pH, Ven: 7.35 (ref 7.25–7.43)
pO2, Ven: 53 mmHg — ABNORMAL HIGH (ref 32–45)

## 2022-03-10 LAB — CBG MONITORING, ED: Glucose-Capillary: 187 mg/dL — ABNORMAL HIGH (ref 70–99)

## 2022-03-10 LAB — GLUCOSE, CAPILLARY: Glucose-Capillary: 221 mg/dL — ABNORMAL HIGH (ref 70–99)

## 2022-03-10 LAB — LACTIC ACID, PLASMA: Lactic Acid, Venous: 1 mmol/L (ref 0.5–1.9)

## 2022-03-10 MED ORDER — SODIUM CHLORIDE 0.9 % IV SOLN: INTRAVENOUS | Status: DC

## 2022-03-10 MED ORDER — LACTATED RINGERS IV BOLUS
1000.0000 mL | Freq: Once | INTRAVENOUS | Status: AC
  Administered 2022-03-10: 1000 mL via INTRAVENOUS

## 2022-03-10 NOTE — ED Provider Notes (Signed)
[facility] DEPT Provider Note   CSN: [id number] Arrival date & time: 03/10/22  7106     History  Chief Complaint  Patient presents with   Hypotension    [name] is a 71 y.o. female.  HPI 71 year old female presents with hypotension. History is from patient and EMS. Patient has a complex PMH which includes DM, hypothyroidism, OSA, CKD and recent pneumonia. She was recently hospitalized for pneumonia and discharged on 12/9.  She finished her antibiotics a few days ago.  She has been feeling weak for about 4 or 5 days.  This is a generalized weakness.  She has been drinking water but has had numerous diarrheal bowel movements for about 3 to 4 days.  At the time she had some lower abdominal pain but that resolved.  Her diarrhea has been gone for about 36 hours.  There was never any blood.  Her blood pressures have been measuring lower than 269 systolic for a few days.  She has been compliant with her meds which includes her metoprolol, Norvasc.  She denies fever, cough, shortness of breath, urinary symptoms, abdominal pain, chest pain.  No headache.  She was due to see her doctor today but was having tremors which she has been having on and off during the same weakness episodes and thus came into the hospital.  EMS reports her blood pressure was in the 70s and then came up to the 80s.  They have given her 100 cc IV fluid.  Home Medications Prior to Admission medications   Medication Sig Start Date End Date Taking? Authorizing Provider  acetaminophen (TYLENOL) 650 MG CR tablet Take 1,300 mg by mouth every 8 (eight) hours as needed for pain.    [provider]  amLODipine (NORVASC) 5 MG tablet Take 5 mg by mouth at bedtime.    [provider]  BD INSULIN SYRINGE U/F 31G X 5/16" 0.5 ML MISC USE 5 TIMES A DAY LEVEMIR AND NOVOLOG 12/13/18   [provider]  BD PEN NEEDLE MICRO U/F 32G X 6 MM MISC Inject 32 pens into the skin daily.  06/18/19   [provider]  Cholecalciferol (DIALYVITE VITAMIN D 5000) 125 MCG (5000 UT) capsule Take 5,000 Units by mouth daily.    [provider]  clonazePAM (KLONOPIN) 0.5 MG tablet Take 0.5 mg by mouth See admin instructions. Take 0.5 mg at bedtime, may take an additional 0.5 mg tablet twice daily as needed for anxiety 07/01/14   [provider]  diclofenac (VOLTAREN) 75 MG EC tablet Take 75 mg by mouth 2 (two) times daily.    [provider]  doxazosin (CARDURA) 2 MG tablet Take 2 mg by mouth daily. 12/04/18   [provider]  fenofibrate 54 MG tablet Take 54 mg by mouth daily with supper. 06/20/17   [provider]  FLUoxetine (PROZAC) 20 MG capsule Take 60 mg by mouth at bedtime.  06/28/17   [provider]  FREESTYLE TEST STRIPS test strip USE STRIPS TO TEST BLOOD GLUCOSE BID 10/03/18   [provider]  gabapentin (NEURONTIN) 300 MG capsule Take 1 capsule (300 mg total) by mouth 3 (three) times daily for 30 days. 07/31/18 02/22/22  Donne Hazel, MD  hyoscyamine (LEVSIN SL) 0.125 MG SL tablet Place 1 tablet (0.125 mg total) under the tongue every 6 (six) hours as needed. Patient not taking: Reported on 02/22/2022 09/17/21   Levin Erp, PA  insulin aspart (  NOVOLOG) 100 UNIT/ML FlexPen Sliding scale dose: CBG < 70: implement hypoglycemia protocol  CBG 70 - 120: 0 units  CBG 121 - 150: 0 units  CBG 151 - 200: 0 units  CBG 201 - 250: 2 units  CBG 251 - 300: 3 units  CBG 301 - 350: 4 units  CBG 351 - 400: 5 units Patient taking differently: Inject 10-20 Units into the skin 2 (two) times daily as needed for high blood sugar. 07/31/18   Donne Hazel, MD  insulin detemir (LEVEMIR) 100 UNIT/ML injection Inject 25 Units into the skin 2 (two) times daily.    [provider]  Lancets Misc. (UNISTIK 3 COMFORT) MISC USE LANCETS TO CHECK BLOOD GLUCOSE BID 11/30/18   [provider]  metoprolol tartrate  (LOPRESSOR) 50 MG tablet Take 50 mg by mouth 3 (three) times daily. 12/04/18   [provider]  omeprazole (PRILOSEC) 20 MG capsule Take 20 mg by mouth daily with supper.     [provider]  oxybutynin (DITROPAN-XL) 10 MG 24 hr tablet Take 10 mg by mouth daily. 06/18/19   [provider]  OZEMPIC, 1 MG/DOSE, 4 MG/3ML SOPN Inject 1 mg into the skin once a week. Thursdays 02/15/22   [provider]  propylthiouracil (PTU) 50 MG tablet Take 50 mg by mouth daily. 12/22/16   [provider]  QUEtiapine (SEROQUEL) 50 MG tablet Take 50 mg by mouth at bedtime. 06/18/19   [provider]  rOPINIRole (REQUIP) 2 MG tablet Take 2 mg by mouth See admin instructions. Take 2 mg at bedtime, may take a second 2 mg dose as needed for restless legs    [provider]  SYMBICORT 160-4.5 MCG/ACT inhaler Inhale 2 puffs into the lungs 2 (two) times daily. 11/11/21   [provider]  tiZANidine (ZANAFLEX) 4 MG tablet Take 4 mg by mouth 2 (two) times daily as needed for muscle spasms. 06/18/19   [provider]  VENTOLIN HFA 108 (90 Base) MCG/ACT inhaler Inhale 2 puffs into the lungs every 4 (four) hours as needed for shortness of breath. 11/11/21   [provider]      Allergies    Imitrex [sumatriptan], Nsaids, Statins, Sulfonamide derivatives, Methimazole, Niacin, Codeine, Promethazine hcl, and Vicodin [hydrocodone-acetaminophen]    Review of Systems   Review of Systems  Constitutional:  Positive for fatigue. Negative for fever.  Respiratory:  Negative for cough and shortness of breath.   Cardiovascular:  Negative for chest pain.  Gastrointestinal:  Positive for diarrhea. Negative for blood in stool.  Genitourinary:  Negative for dysuria.  Neurological:  Positive for tremors, weakness and light-headedness. Negative for headaches.    Physical Exam Updated Vital Signs BP (!) 77/52 (BP Location: Right Arm)   Pulse 66   Temp 97.7  F (36.5 C) (Oral)   Resp (!) 25   SpO2 93%  Physical Exam Vitals and nursing note reviewed.  Constitutional:      Appearance: She is well-developed. She is obese. She is not diaphoretic.  HENT:     Head: Normocephalic and atraumatic.     Mouth/Throat:     Mouth: Mucous membranes are dry.  Eyes:     Extraocular Movements: Extraocular movements intact.     Pupils: Pupils are equal, round, and reactive to light.  Cardiovascular:     Rate and Rhythm: Normal rate and regular rhythm.     Heart sounds: Murmur heard.  Pulmonary:     Effort: Pulmonary  effort is normal.     Breath sounds: Normal breath sounds.  Abdominal:     Palpations: Abdomen is soft.     Tenderness: There is no abdominal tenderness.  Musculoskeletal:     Cervical back: No rigidity.  Skin:    General: Skin is warm and dry.  Neurological:     Mental Status: She is alert.     Comments: Equal strength in all 4 extremities, though she is unable to sit up on the stretcher on her own and needs assistance.     ED Results / Procedures / Treatments   Labs (all labs ordered are listed, but only abnormal results are displayed) Labs Reviewed  COMPREHENSIVE METABOLIC PANEL - Abnormal; Notable for the following components:      Result Value   CO2 20 (*)    Glucose, Bld 210 (*)    BUN 66 (*)    Creatinine, Ser 2.08 (*)    Calcium 8.0 (*)    Total Protein 4.9 (*)    Albumin 2.1 (*)    GFR, Estimated 25 (*)    All other components within normal limits  CBC WITH DIFFERENTIAL/PLATELET - Abnormal; Notable for the following components:   WBC 3.0 (*)    RBC 3.35 (*)    Hemoglobin 9.1 (*)    HCT 30.0 (*)    RDW 15.9 (*)    Platelets 114 (*)    Lymphs Abs 0.3 (*)    Abs Immature Granulocytes 0.10 (*)    All other components within normal limits  BLOOD GAS, VENOUS - Abnormal; Notable for the following components:   pCO2, Ven 39 (*)    pO2, Ven 53 (*)    Acid-base deficit 3.8 (*)    All other components within normal  limits  CBG MONITORING, ED - Abnormal; Notable for the following components:   Glucose-Capillary 187 (*)    All other components within normal limits  I-STAT CHEM 8, ED - Abnormal; Notable for the following components:   BUN 70 (*)    Creatinine, Ser 2.20 (*)    Glucose, Bld 206 (*)    Calcium, Ion 1.06 (*)    TCO2 20 (*)    Hemoglobin 8.8 (*)    HCT 26.0 (*)    All other components within normal limits  LACTIC ACID, PLASMA  URINALYSIS, ROUTINE W REFLEX MICROSCOPIC    EKG EKG Interpretation  Date/Time:  Wednesday March 10 2022 09:15:01 EST Ventricular Rate:  65 PR Interval:  170 QRS Duration: 107 QT Interval:  429 QTC Calculation: 447 R Axis:   52 Text Interpretation: Sinus rhythm Low voltage, precordial leads nonspecific changes similar to Feb 21 2022 Confirmed by Sherwood Gambler [phone]) on 03/10/2022 9:21:50 AM  Radiology DG Chest Portable 1 View  Result Date: 03/10/2022 CLINICAL DATA:  71 year old female presents for evaluation of hypotension and weakness. EXAM: PORTABLE CHEST 1 VIEW COMPARISON:  February 23, 2022 FINDINGS: Kg leads project over the chest. Signs of spinal fusion partially imaged in the upper lumbar and lower thoracic spine. RIGHT shoulder arthroplasty. Cardiomediastinal contours and hilar structures are stable, still with signs of cardiomegaly accentuated by AP projection. Improved aeration in the chest and improved interstitial and airspace disease still with persistent interstitial and airspace process in the RIGHT upper lobe which is greatly improved. No signs of pneumothorax. On limited assessment no acute skeletal process. IMPRESSION: Dramatic improvement with respect to the appearance of the chest with improved aeration globally and with diminished interstitial airspace disease  still with mild persistent process in the RIGHT upper lobe. Electronically Signed   By: Zetta Bills M.D.   On: 03/10/2022 09:36    Procedures .Critical Care  Performed by:  Sherwood Gambler, MD Authorized by: Sherwood Gambler, MD   Critical care provider statement:    Critical care time (minutes):  35   Critical care time was exclusive of:  Separately billable procedures and treating other patients   Critical care was necessary to treat or prevent imminent or life-threatening deterioration of the following conditions:  Renal failure and circulatory failure   Critical care was time spent personally by me on the following activities:  Development of treatment plan with patient or surrogate, discussions with consultants, evaluation of patient's response to treatment, examination of patient, ordering and review of laboratory studies, ordering and review of radiographic studies, ordering and performing treatments and interventions, pulse oximetry, re-evaluation of patient's condition and review of old charts     Medications Ordered in ED Medications  lactated ringers bolus 1,000 mL (1,000 mLs Intravenous New Bag/Given 03/10/22 0940)    ED Course/ Medical Decision Making/ A&P                           Medical Decision Making Amount and/or Complexity of Data Reviewed Independent Historian: spouse and EMS External Data Reviewed: notes. Labs: ordered.    Details: Chronic leukopenia.  She has an acute kidney injury with elevated BUN and creatinine compared to earlier in the month.  Lactate is normal. Radiology: ordered and independent interpretation performed.    Details: Resolving pneumonia. ECG/medicine tests: ordered and independent interpretation performed.    Details: No acute ischemia.  Risk Decision regarding hospitalization.   Patient presents with fatigue and hypotension.  At this time based on presentation I doubt sepsis.  I think this is likely more hypovolemia from diarrhea as well as a combination of continuing blood pressure meds while being hypotensive at home.  She was given IV fluids and has responded with increasing though still relatively low  blood pressures.  She has no infectious symptoms, chest pain, etc.  Given the hypotension and acute kidney injury I think she will need fluids and admission.  Discussed with hospitalist, Dr. Marylyn Ishihara.  Of note, husband notes that she was started on vancomycin, took her 3rd dose this morning. However, given the diarrhea has stopped, I think it's unlikely it's C diff (time course doesn't match up and probably wouldn't totally stop diarrhea that quickly). Will order C diff testing to be run if she has diarrhea. Admit.       Final Clinical Impression(s) / ED Diagnoses Final diagnoses:  Acute kidney injury ([place])  Hypotension due to hypovolemia    Rx / DC Orders ED Discharge Orders     None         Sherwood Gambler, MD 03/10/22 1040

## 2022-03-10 NOTE — H&P (Signed)
History and Physical    Patient: [name] AJO:[id number] DOB: [birth year] DOA: 03/10/2022 DOS: the patient was seen and examined on 03/10/2022 PCP: Crist Infante, MD  Patient coming from: Home  Chief Complaint:  Chief Complaint  Patient presents with   Hypotension   HPI: [name] is a 71 y.o. female with medical history significant of DM2, CKD 3b, anxiety/depression, leukopenia. Presenting with weakness. She was discharged from the hospital about 10 days ago after a stay for multifocal PNA. She reports that she finished her course of abx and was doing well until 6 days ago. She had multiple episodes of diarrhea daily w/ some abdominal cramping. She didn't have any fevers, N/V or sick contacts. She spoke with her PCP yesterday and was prescribed vancomycin but by that time her diarrhea had stopped. She did not send a sample for testing. She took her first dose of vanco last night. Since yesterday morning, she has not had any diarrhea. However, she seemed a little confused last night. This morning she was getting up to go to her hospital follow up appointment with her PCP when her son notice that she was really weak. He decided to bring her to the ED instead. He reports that she has had poor appetite during this time. Otherwise, she has not had any other aggravating or alleviating factors.     Review of Systems: As mentioned in the history of present illness. All other systems reviewed and are negative. Past Medical History:  Diagnosis Date   Anemia    Arthritis    Asthma    OCCAS   Bell's palsy    Broken neck (HCC)    C-1   Carbuncle and furuncle of trunk    Chronic dislocation of right shoulder    Depression    Disorder of fascia    HX OF NECROTIC FASCITIS AFTER ABDOMINAL SURGERY FOR HERNIA- REQUIRED 19 SURGERIES AND 2.5 MONTH HOSPITALIZATION AT [facility]   DM (diabetes mellitus) ([place])    Dysrhythmia    HX OF TACHYCARDIA AND BRADYCARDIA - ON GOING FOR YEARS - DOES NOT HAVE TO  SEE CARDIOLOGIST   Elevated cholesterol    Fibromyalgia    Fracture MAY 2014   HX OF FRACTURED NECK C1- CAUSES SEVERE HEADACHES--LIMITED ROM NECK   Frequent infections    ESPECIALLY PRONE TO INFECTIONS AFTER SURGERIES   Graves disease    Heart murmur    DOES NOT CAUSE ANY PROBLEMS   History of kidney stones    Hyperlipidemia    Hypertension    Hypothyroidism    GRAVES DISEASE   Migraine    Nephrolithiasis    STAGE 3    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   Neuropathy    Obesity    OSA (obstructive sleep apnea)    USES CPAP - DOES NOT KNOW SETTING   Pain    LEFT SHOULDER  PAIN -HARD TO LIE ON LEFT SIDE FOR LONG PERIOD;  PAIN IN LOWER BADK - 3 HERNIATED DISCS AND STENOISIS -   PONV (postoperative nausea and vomiting)    THE GAS MAKES ME NAUSEATED   Restless leg syndrome    Sciatica    Tachycardia    Urinary frequency    Urticaria    UTI (urinary tract infection)    Past Surgical History:  Procedure Laterality Date   ABDOMINAL HYSTERECTOMY     LARGE TUMOR AT OVARY REMOVED   ANTERIOR LAT LUMBAR FUSION N/A 07/28/2018   Procedure: Anterolateral Decompression  Lumbar One-Two for osteomyelitis reconstruction w/titanium strut allograft Fusion Lumbar One-Two;  Surgeon: Kristeen Miss, MD;  Location: [place];  Service: Neurosurgery;  Laterality: N/A;  Left anterolateral approach   APPENDECTOMY     APPLICATION OF ROBOTIC ASSISTANCE FOR SPINAL PROCEDURE N/A 07/28/2018   Procedure: APPLICATION OF ROBOTIC ASSISTANCE FOR SPINAL PROCEDURE;  Surgeon: Kristeen Miss, MD;  Location: [place];  Service: Neurosurgery;  Laterality: N/A;   CARPAL TUNNEL RELEASE     BILATERAL   CATARACT EXTRACTION W/ INTRAOCULAR LENS  IMPLANT, BILATERAL     CESAREAN SECTION     X 3   CHOLECYSTECTOMY     CYSTOSCOPY WITH URETEROSCOPY Right 12/03/2013   Procedure: CYSTOSCOPY WITH RIGHT RETROGRADE URETEROSCOPY LASER LITHOTRIPSY RIGHT STONE RIGHT URETERAL STENT, ;  Surgeon: Raynelle Bring, MD;  Location: [facility];  Service: Urology;   Laterality: Right;  PROCEDURE WAS ORIGINALLY SCHEDULED AS RIGHT PERCUTANEOUS NEPHROLITHOTOMY   EYELID LACERATION REPAIR     RIGHT EYE   HERNIA REPAIR     ABDOMINAL HERNIA REPAIR WITH MESH - 3 SURGERIES    HX OF 19 SURGERIES FOR NECROTIC FASCITIS     SKIN GRAFTS+ WOUND  VAC   I&D OF INFECTED SITE IN [facility] - FROM AN INJECTION     IR FLUORO GUIDED NEEDLE PLC ASPIRATION/INJECTION LOC  06/03/2018   JOINT REPLACEMENT     TOTAL RIGHT KNEE REPLACEMENT   KNEE ARTHROSCOPY  RIGHT AND LEFT   X 2   LAPAROSCOPIC GASTRIC SLEEVE RESECTION  2014   NEPHROLITHOTOMY Right 12/10/2013   Procedure: NEPHROLITHOTOMY PERCUTANEOUS;  Surgeon: Raynelle Bring, MD;  Location: [facility];  Service: Urology;  Laterality: Right;   POSTERIOR LUMBAR FUSION 4 LEVEL N/A 07/28/2018   Procedure: Posterior Fixation Thoracic Ten-Lumbar Four with pedicle augmentaion with robotic assistance;  Surgeon: Kristeen Miss, MD;  Location: [place];  Service: Neurosurgery;  Laterality: N/A;  Posterior Fixation Thoracic Ten-Lumbar Four with pedicle augmentaion with robotic assistance   REVERSE SHOULDER ARTHROPLASTY Right 07/21/2017   REVERSE SHOULDER ARTHROPLASTY Right 07/21/2017   Procedure: RIGHT REVERSE SHOULDER ARTHROPLASTY;  Surgeon: Tania Ade, MD;  Location: [place];  Service: Orthopedics;  Laterality: Right;   RIGHT FOOT DRAINAGE OF INFECTION     shoulder arthroscopy Right    X 2   TONSILLECTOMY     AND ADENOIDECTOMY   Social History:  reports that she has never smoked. She has never used smokeless tobacco. She reports that she does not drink alcohol and does not use drugs.  Allergies  Allergen Reactions   Imitrex [Sumatriptan] Other (See Comments)    Vascular spasms   Nsaids Other (See Comments)    PT UNABLE TO TOLERATE NSAID'S DRUGS DUE TO HX OF GASTRIC SLEEVE SURGERY   Statins Other (See Comments)    Leg/muscle pain   Sulfonamide Derivatives Hives    Childhood allergy    Methimazole Hives   Niacin     heart pounding and  beating fast.   Codeine Nausea And Vomiting   Promethazine Hcl Other (See Comments)    Restless leg feeling all over body   Vicodin [Hydrocodone-Acetaminophen] Nausea And Vomiting    Family History  Problem Relation Age of Onset   Stroke Mother    Bladder Cancer Mother    Diabetes Father    Osteoarthritis Father    Heart disease Father    Ulcers Father    Suicidality Sister    Colon cancer Neg Hx    Stomach cancer Neg Hx    Rectal  cancer Neg Hx    Esophageal cancer Neg Hx    Pancreatic cancer Neg Hx     Prior to Admission medications   Medication Sig Start Date End Date Taking? Authorizing Provider  acetaminophen (TYLENOL) 650 MG CR tablet Take 1,300 mg by mouth every 8 (eight) hours as needed for pain.    [provider]  amLODipine (NORVASC) 5 MG tablet Take 5 mg by mouth at bedtime.    [provider]  BD INSULIN SYRINGE U/F 31G X 5/16" 0.5 ML MISC USE 5 TIMES A DAY LEVEMIR AND NOVOLOG 12/13/18   [provider]  BD PEN NEEDLE MICRO U/F 32G X 6 MM MISC Inject 32 pens into the skin daily. 06/18/19   [provider]  Cholecalciferol (DIALYVITE VITAMIN D 5000) 125 MCG (5000 UT) capsule Take 5,000 Units by mouth daily.    [provider]  clonazePAM (KLONOPIN) 0.5 MG tablet Take 0.5 mg by mouth See admin instructions. Take 0.5 mg at bedtime, may take an additional 0.5 mg tablet twice daily as needed for anxiety 07/01/14   [provider]  diclofenac (VOLTAREN) 75 MG EC tablet Take 75 mg by mouth 2 (two) times daily.    [provider]  doxazosin (CARDURA) 2 MG tablet Take 2 mg by mouth daily. 12/04/18   [provider]  fenofibrate 54 MG tablet Take 54 mg by mouth daily with supper. 06/20/17   [provider]  FLUoxetine (PROZAC) 20 MG capsule Take 60 mg by mouth at bedtime.  06/28/17   [provider]  FREESTYLE TEST STRIPS test strip USE STRIPS TO TEST BLOOD GLUCOSE BID 10/03/18   [provider]  gabapentin (NEURONTIN) 300 MG capsule Take 1 capsule (300 mg total) by mouth 3 (three) times daily for 30 days. 07/31/18 02/22/22  Donne Hazel, MD  hyoscyamine (LEVSIN SL) 0.125 MG SL tablet Place 1 tablet (0.125 mg total) under the tongue every 6 (six) hours as needed. Patient not taking: Reported on 02/22/2022 09/17/21   Levin Erp, PA  insulin aspart (NOVOLOG) 100 UNIT/ML FlexPen Sliding scale dose: CBG < 70: implement hypoglycemia protocol  CBG 70 - 120: 0 units  CBG 121 - 150: 0 units  CBG 151 - 200: 0 units  CBG 201 - 250: 2 units  CBG 251 - 300: 3 units  CBG 301 - 350: 4 units  CBG 351 - 400: 5 units Patient taking differently: Inject 10-20 Units into the skin 2 (two) times daily as needed for high blood sugar. 07/31/18   Donne Hazel, MD  insulin detemir (LEVEMIR) 100 UNIT/ML injection Inject 25 Units into the skin 2 (two) times daily.    [provider]  Lancets Misc. (UNISTIK 3 COMFORT) MISC USE LANCETS TO CHECK BLOOD GLUCOSE BID 11/30/18   [provider]  metoprolol tartrate (LOPRESSOR) 50 MG tablet Take 50 mg by mouth 3 (three) times daily. 12/04/18   [provider]  omeprazole (PRILOSEC) 20 MG capsule Take 20 mg by mouth daily with supper.     [provider]  oxybutynin (DITROPAN-XL) 10 MG 24 hr tablet Take 10 mg by mouth daily. 06/18/19   [provider]  OZEMPIC, 1 MG/DOSE, 4 MG/3ML SOPN Inject 1 mg into the skin once a week. Thursdays 02/15/22   [provider]  propylthiouracil (PTU) 50 MG tablet Take 50 mg by mouth daily. 12/22/16   [provider]  QUEtiapine (SEROQUEL) 50 MG tablet Take 50  mg by mouth at bedtime. 06/18/19   [provider]  rOPINIRole (REQUIP) 2 MG tablet Take 2 mg by mouth See admin instructions. Take 2 mg at bedtime, may take a second 2 mg dose as needed for restless legs    [provider]  SYMBICORT 160-4.5 MCG/ACT inhaler Inhale 2 puffs into the  lungs 2 (two) times daily. 11/11/21   [provider]  tiZANidine (ZANAFLEX) 4 MG tablet Take 4 mg by mouth 2 (two) times daily as needed for muscle spasms. 06/18/19   [provider]  VENTOLIN HFA 108 (90 Base) MCG/ACT inhaler Inhale 2 puffs into the lungs every 4 (four) hours as needed for shortness of breath. 11/11/21   [provider]    Physical Exam: Vitals:   03/10/22 0903 03/10/22 0950 03/10/22 1000  BP: (!) 77/52 95/70 (!) 71/45  Pulse: 66 65 66  Resp: (!) '25 18 19  '$ Temp: 97.7 F (36.5 C)    TempSrc: Oral    SpO2: 93% 95% 92%   General: 71 y.o. female resting in bed in NAD Eyes: PERRL, normal sclera ENMT: Nares patent w/o discharge, orophaynx clear, dentition normal, ears w/o discharge/lesions/ulcers Neck: Supple, trachea midline Cardiovascular: RRR, +S1, S2, no m/g/r, equal pulses throughout Respiratory: CTABL, no w/r/r, normal WOB GI: BS+, NDNT, no masses noted, no organomegaly noted MSK: No e/c/c Neuro: A&O x 3, no focal deficits Psyc: Appropriate interaction and affect, calm/cooperative  Data Reviewed:  Results for orders placed or performed during the hospital encounter of 03/10/22 (from the past 24 hour(s))  Comprehensive metabolic panel     Status: Abnormal   Collection Time: 03/10/22  9:17 AM  Result Value Ref Range   Sodium 135 135 - 145 mmol/L   Potassium 4.7 3.5 - 5.1 mmol/L   Chloride 107 98 - 111 mmol/L   CO2 20 (L) 22 - 32 mmol/L   Glucose, Bld 210 (H) 70 - 99 mg/dL   BUN 66 (H) 8 - 23 mg/dL   Creatinine, Ser 2.08 (H) 0.44 - 1.00 mg/dL   Calcium 8.0 (L) 8.9 - 10.3 mg/dL   Total Protein 4.9 (L) 6.5 - 8.1 g/dL   Albumin 2.1 (L) 3.5 - 5.0 g/dL   AST 15 15 - 41 U/L   ALT 13 0 - 44 U/L   Alkaline Phosphatase 102 38 - 126 U/L   Total Bilirubin 0.7 0.3 - 1.2 mg/dL   GFR, Estimated 25 (L) >60 mL/min   Anion gap 8 5 - 15  CBC with Differential     Status: Abnormal   Collection Time: 03/10/22  9:17 AM  Result Value Ref Range    WBC 3.0 (L) 4.0 - 10.5 K/uL   RBC 3.35 (L) 3.87 - 5.11 MIL/uL   Hemoglobin 9.1 (L) 12.0 - 15.0 g/dL   HCT 30.0 (L) 36.0 - 46.0 %   MCV 89.6 80.0 - 100.0 fL   MCH 27.2 26.0 - 34.0 pg   MCHC 30.3 30.0 - 36.0 g/dL   RDW 15.9 (H) 11.5 - 15.5 %   Platelets 114 (L) 150 - 400 K/uL   nRBC 0.0 0.0 - 0.2 %   Neutrophils Relative % 79 %   Neutro Abs 2.4 1.7 - 7.7 K/uL   Lymphocytes Relative 11 %   Lymphs Abs 0.3 (L) 0.7 - 4.0 K/uL   Monocytes Relative 6 %   Monocytes Absolute 0.2 0.1 - 1.0 K/uL   Eosinophils Relative 0 %   Eosinophils Absolute 0.0 0.0 -  0.5 K/uL   Basophils Relative 1 %   Basophils Absolute 0.0 0.0 - 0.1 K/uL   Immature Granulocytes 3 %   Abs Immature Granulocytes 0.10 (H) 0.00 - 0.07 K/uL  Lactic acid, plasma     Status: None   Collection Time: 03/10/22  9:25 AM  Result Value Ref Range   Lactic Acid, Venous 1.0 0.5 - 1.9 mmol/L  I-stat chem 8, ED     Status: Abnormal   Collection Time: 03/10/22  9:31 AM  Result Value Ref Range   Sodium 135 135 - 145 mmol/L   Potassium 5.0 3.5 - 5.1 mmol/L   Chloride 106 98 - 111 mmol/L   BUN 70 (H) 8 - 23 mg/dL   Creatinine, Ser 2.20 (H) 0.44 - 1.00 mg/dL   Glucose, Bld 206 (H) 70 - 99 mg/dL   Calcium, Ion 1.06 (L) 1.15 - 1.40 mmol/L   TCO2 20 (L) 22 - 32 mmol/L   Hemoglobin 8.8 (L) 12.0 - 15.0 g/dL   HCT 26.0 (L) 36.0 - 46.0 %  CBG monitoring, ED     Status: Abnormal   Collection Time: 03/10/22  9:33 AM  Result Value Ref Range   Glucose-Capillary 187 (H) 70 - 99 mg/dL  Blood gas, venous     Status: Abnormal   Collection Time: 03/10/22  9:34 AM  Result Value Ref Range   pH, Ven 7.35 7.25 - 7.43   pCO2, Ven 39 (L) 44 - 60 mmHg   pO2, Ven 53 (H) 32 - 45 mmHg   Bicarbonate 21.5 20.0 - 28.0 mmol/L   Acid-base deficit 3.8 (H) 0.0 - 2.0 mmol/L   O2 Saturation 86 %   Patient temperature 37.0    CXR: Dramatic improvement with respect to the appearance of the chest with improved aeration globally and with diminished  interstitial airspace disease still with mild persistent process in the RIGHT upper lobe.  Assessment and Plan: AKI on CKD3a Dehydration Hypotension     - place in obs, tele     - continue fluids     - check renal US     - likely she had poor appetite combined w/ the diarrhea and continued use of her BP meds that led to dehydration and hypotension  Diarrhea     - her diarrhea resolved before her first dose of vanc     - will hold on further doses of vanc for right now     - send sample for study if she is able to produce one     - hold on her PPI for now as well     - continue fluids  DM2     - SSI, glucose checks, DM diet  HTN     - hold her BP regimen for now  Anxiety Depression     - continue home regimen when confirmed  GERD     - hold PPI for now  HLD     - continue home regimen when confirmed  Hyperthyroidism     - continue home regimen when confirmed  Advance Care Planning:   Code Status: FULL  Consults: None  Family Communication: w/ son at bedside  Severity of Illness: The appropriate patient status for this patient is OBSERVATION. Observation status is judged to be reasonable and necessary in order to provide the required intensity of service to ensure the patient's safety. The patient's presenting symptoms, physical exam findings, and initial radiographic and laboratory data in the context of  their medical condition is felt to place them at decreased risk for further clinical deterioration. Furthermore, it is anticipated that the patient will be medically stable for discharge from the hospital within 2 midnights of admission.   Author: Jonnie Finner, DO 03/10/2022 10:36 AM  For on call review [email or web address].

## 2022-03-10 NOTE — ED Triage Notes (Signed)
Pt bib [facility] d/t hypotension and weakness. Pt was hospitalized for PNE has a follow up today however d/t weakness pt presented to ED.

## 2022-03-11 ENCOUNTER — Observation Stay (HOSPITAL_COMMUNITY): Payer: Medicare Other

## 2022-03-11 DIAGNOSIS — G2581 Restless legs syndrome: Secondary | ICD-10-CM | POA: Diagnosis present

## 2022-03-11 DIAGNOSIS — I129 Hypertensive chronic kidney disease with stage 1 through stage 4 chronic kidney disease, or unspecified chronic kidney disease: Secondary | ICD-10-CM | POA: Diagnosis present

## 2022-03-11 DIAGNOSIS — J45909 Unspecified asthma, uncomplicated: Secondary | ICD-10-CM | POA: Diagnosis present

## 2022-03-11 DIAGNOSIS — F32A Depression, unspecified: Secondary | ICD-10-CM | POA: Diagnosis present

## 2022-03-11 DIAGNOSIS — E1122 Type 2 diabetes mellitus with diabetic chronic kidney disease: Secondary | ICD-10-CM | POA: Diagnosis present

## 2022-03-11 DIAGNOSIS — E8809 Other disorders of plasma-protein metabolism, not elsewhere classified: Secondary | ICD-10-CM | POA: Diagnosis present

## 2022-03-11 DIAGNOSIS — E860 Dehydration: Secondary | ICD-10-CM | POA: Diagnosis present

## 2022-03-11 DIAGNOSIS — Z96651 Presence of right artificial knee joint: Secondary | ICD-10-CM | POA: Diagnosis present

## 2022-03-11 DIAGNOSIS — E669 Obesity, unspecified: Secondary | ICD-10-CM | POA: Diagnosis not present

## 2022-03-11 DIAGNOSIS — K219 Gastro-esophageal reflux disease without esophagitis: Secondary | ICD-10-CM | POA: Diagnosis present

## 2022-03-11 DIAGNOSIS — N1831 Chronic kidney disease, stage 3a: Secondary | ICD-10-CM | POA: Diagnosis present

## 2022-03-11 DIAGNOSIS — E0500 Thyrotoxicosis with diffuse goiter without thyrotoxic crisis or storm: Secondary | ICD-10-CM | POA: Diagnosis present

## 2022-03-11 DIAGNOSIS — I9589 Other hypotension: Secondary | ICD-10-CM | POA: Diagnosis present

## 2022-03-11 DIAGNOSIS — M797 Fibromyalgia: Secondary | ICD-10-CM | POA: Diagnosis present

## 2022-03-11 DIAGNOSIS — F419 Anxiety disorder, unspecified: Secondary | ICD-10-CM | POA: Diagnosis present

## 2022-03-11 DIAGNOSIS — Z794 Long term (current) use of insulin: Secondary | ICD-10-CM | POA: Diagnosis not present

## 2022-03-11 DIAGNOSIS — N179 Acute kidney failure, unspecified: Secondary | ICD-10-CM | POA: Diagnosis present

## 2022-03-11 DIAGNOSIS — E039 Hypothyroidism, unspecified: Secondary | ICD-10-CM | POA: Diagnosis present

## 2022-03-11 DIAGNOSIS — D649 Anemia, unspecified: Secondary | ICD-10-CM | POA: Diagnosis present

## 2022-03-11 DIAGNOSIS — Z96611 Presence of right artificial shoulder joint: Secondary | ICD-10-CM | POA: Diagnosis present

## 2022-03-11 DIAGNOSIS — E861 Hypovolemia: Secondary | ICD-10-CM | POA: Diagnosis present

## 2022-03-11 DIAGNOSIS — Z961 Presence of intraocular lens: Secondary | ICD-10-CM | POA: Diagnosis present

## 2022-03-11 DIAGNOSIS — Z6841 Body Mass Index (BMI) 40.0 and over, adult: Secondary | ICD-10-CM | POA: Diagnosis not present

## 2022-03-11 DIAGNOSIS — D72819 Decreased white blood cell count, unspecified: Secondary | ICD-10-CM | POA: Diagnosis present

## 2022-03-11 DIAGNOSIS — E1169 Type 2 diabetes mellitus with other specified complication: Secondary | ICD-10-CM | POA: Diagnosis not present

## 2022-03-11 LAB — CBC
HCT: 30.9 % — ABNORMAL LOW (ref 36.0–46.0)
HCT: 31.2 % — ABNORMAL LOW (ref 36.0–46.0)
Hemoglobin: 9.6 g/dL — ABNORMAL LOW (ref 12.0–15.0)
Hemoglobin: 9.6 g/dL — ABNORMAL LOW (ref 12.0–15.0)
MCH: 27 pg (ref 26.0–34.0)
MCH: 27 pg (ref 26.0–34.0)
MCHC: 30.8 g/dL (ref 30.0–36.0)
MCHC: 31.1 g/dL (ref 30.0–36.0)
MCV: 87 fL (ref 80.0–100.0)
MCV: 87.9 fL (ref 80.0–100.0)
Platelets: 100 10*3/uL — ABNORMAL LOW (ref 150–400)
Platelets: 100 10*3/uL — ABNORMAL LOW (ref 150–400)
RBC: 3.55 MIL/uL — ABNORMAL LOW (ref 3.87–5.11)
RBC: 3.55 MIL/uL — ABNORMAL LOW (ref 3.87–5.11)
RDW: 15.5 % (ref 11.5–15.5)
RDW: 15.5 % (ref 11.5–15.5)
WBC: 2.4 10*3/uL — ABNORMAL LOW (ref 4.0–10.5)
WBC: 2.4 10*3/uL — ABNORMAL LOW (ref 4.0–10.5)
nRBC: 0 % (ref 0.0–0.2)
nRBC: 0 % (ref 0.0–0.2)

## 2022-03-11 LAB — BASIC METABOLIC PANEL
Anion gap: 7 (ref 5–15)
Anion gap: 9 (ref 5–15)
BUN: 42 mg/dL — ABNORMAL HIGH (ref 8–23)
BUN: 34 mg/dL — ABNORMAL HIGH (ref 8–23)
CO2: 23 mmol/L (ref 22–32)
CO2: 21 mmol/L — ABNORMAL LOW (ref 22–32)
Calcium: 8.2 mg/dL — ABNORMAL LOW (ref 8.9–10.3)
Calcium: 8.4 mg/dL — ABNORMAL LOW (ref 8.9–10.3)
Chloride: 111 mmol/L (ref 98–111)
Chloride: 110 mmol/L (ref 98–111)
Creatinine, Ser: 1.43 mg/dL — ABNORMAL HIGH (ref 0.44–1.00)
Creatinine, Ser: 1.29 mg/dL — ABNORMAL HIGH (ref 0.44–1.00)
GFR, Estimated: 40 mL/min — ABNORMAL LOW (ref >60)
GFR, Estimated: 45 mL/min — ABNORMAL LOW (ref >60)
Glucose, Bld: 186 mg/dL — ABNORMAL HIGH (ref 70–99)
Glucose, Bld: 205 mg/dL — ABNORMAL HIGH (ref 70–99)
Potassium: 4.6 mmol/L (ref 3.5–5.1)
Potassium: 4.9 mmol/L (ref 3.5–5.1)
Sodium: 141 mmol/L (ref 135–145)
Sodium: 140 mmol/L (ref 135–145)

## 2022-03-11 LAB — COMPREHENSIVE METABOLIC PANEL
ALT: 17 U/L (ref 0–44)
AST: 21 U/L (ref 15–41)
Albumin: 1.9 g/dL — ABNORMAL LOW (ref 3.5–5.0)
Alkaline Phosphatase: 101 U/L (ref 38–126)
Anion gap: 7 (ref 5–15)
BUN: 41 mg/dL — ABNORMAL HIGH (ref 8–23)
CO2: 23 mmol/L (ref 22–32)
Calcium: 8 mg/dL — ABNORMAL LOW (ref 8.9–10.3)
Chloride: 110 mmol/L (ref 98–111)
Creatinine, Ser: 1.43 mg/dL — ABNORMAL HIGH (ref 0.44–1.00)
GFR, Estimated: 40 mL/min — ABNORMAL LOW (ref >60)
Glucose, Bld: 182 mg/dL — ABNORMAL HIGH (ref 70–99)
Potassium: 4.5 mmol/L (ref 3.5–5.1)
Sodium: 140 mmol/L (ref 135–145)
Total Bilirubin: 0.7 mg/dL (ref 0.3–1.2)
Total Protein: 4.9 g/dL — ABNORMAL LOW (ref 6.5–8.1)

## 2022-03-11 LAB — GLUCOSE, CAPILLARY
Glucose-Capillary: 187 mg/dL — ABNORMAL HIGH (ref 70–99)
Glucose-Capillary: 163 mg/dL — ABNORMAL HIGH (ref 70–99)
Glucose-Capillary: 167 mg/dL — ABNORMAL HIGH (ref 70–99)

## 2022-03-11 LAB — C DIFFICILE QUICK SCREEN W PCR REFLEX
C Diff antigen: NEGATIVE
C Diff interpretation: NOT DETECTED
C Diff toxin: NEGATIVE

## 2022-03-11 MED ORDER — METOPROLOL TARTRATE 50 MG PO TABS
50.0000 mg | ORAL_TABLET | Freq: Three times a day (TID) | ORAL | Status: DC
  Administered 2022-03-11 – 2022-03-14 (×8): 50 mg via ORAL
  Filled 2022-03-11 (×8): qty 1

## 2022-03-11 MED ORDER — QUETIAPINE FUMARATE 25 MG PO TABS
50.0000 mg | ORAL_TABLET | Freq: Every day | ORAL | Status: DC
  Administered 2022-03-11: 50 mg via ORAL
  Administered 2022-03-12: 25 mg via ORAL
  Administered 2022-03-13: 50 mg via ORAL
  Filled 2022-03-11 (×3): qty 2

## 2022-03-11 MED ORDER — CLONAZEPAM 0.5 MG PO TABS
0.5000 mg | ORAL_TABLET | Freq: Every day | ORAL | Status: DC
  Administered 2022-03-11 – 2022-03-13 (×3): 0.5 mg via ORAL
  Filled 2022-03-11 (×3): qty 1

## 2022-03-11 MED ORDER — OXYBUTYNIN CHLORIDE ER 5 MG PO TB24
10.0000 mg | ORAL_TABLET | Freq: Every day | ORAL | Status: DC
  Administered 2022-03-11 – 2022-03-14 (×4): 10 mg via ORAL
  Filled 2022-03-11 (×4): qty 2

## 2022-03-11 MED ORDER — PROPYLTHIOURACIL 50 MG PO TABS
50.0000 mg | ORAL_TABLET | Freq: Every day | ORAL | Status: DC
  Administered 2022-03-11 – 2022-03-14 (×4): 50 mg via ORAL
  Filled 2022-03-11 (×4): qty 1

## 2022-03-11 MED ORDER — PANTOPRAZOLE SODIUM 40 MG PO TBEC
40.0000 mg | DELAYED_RELEASE_TABLET | Freq: Every day | ORAL | Status: DC
  Administered 2022-03-11 – 2022-03-14 (×4): 40 mg via ORAL
  Filled 2022-03-11 (×4): qty 1

## 2022-03-11 MED ORDER — FLUTICASONE FUROATE-VILANTEROL 200-25 MCG/ACT IN AEPB
1.0000 | INHALATION_SPRAY | Freq: Every day | RESPIRATORY_TRACT | Status: DC
  Administered 2022-03-12 – 2022-03-14 (×3): 1 via RESPIRATORY_TRACT
  Filled 2022-03-11: qty 28

## 2022-03-11 MED ORDER — ACETAMINOPHEN 325 MG PO TABS
650.0000 mg | ORAL_TABLET | Freq: Four times a day (QID) | ORAL | Status: DC | PRN
  Administered 2022-03-12: 650 mg via ORAL
  Filled 2022-03-11: qty 2

## 2022-03-11 MED ORDER — ONDANSETRON HCL 4 MG PO TABS
4.0000 mg | ORAL_TABLET | Freq: Four times a day (QID) | ORAL | Status: DC | PRN
  Administered 2022-03-14: 4 mg via ORAL
  Filled 2022-03-11: qty 1

## 2022-03-11 MED ORDER — CLONAZEPAM 0.5 MG PO TABS: 0.5000 mg | ORAL_TABLET | Freq: Two times a day (BID) | ORAL | Status: DC | PRN

## 2022-03-11 MED ORDER — ONDANSETRON HCL 4 MG/2ML IJ SOLN
4.0000 mg | Freq: Four times a day (QID) | INTRAMUSCULAR | Status: DC | PRN
  Administered 2022-03-11 – 2022-03-13 (×3): 4 mg via INTRAVENOUS
  Filled 2022-03-11 (×3): qty 2

## 2022-03-11 MED ORDER — INSULIN DETEMIR 100 UNIT/ML ~~LOC~~ SOLN
10.0000 [IU] | Freq: Every day | SUBCUTANEOUS | Status: DC
  Administered 2022-03-11 – 2022-03-12 (×2): 10 [IU] via SUBCUTANEOUS
  Filled 2022-03-11 (×3): qty 0.1

## 2022-03-11 MED ORDER — INSULIN ASPART 100 UNIT/ML IJ SOLN
0.0000 [IU] | Freq: Three times a day (TID) | INTRAMUSCULAR | Status: DC
  Administered 2022-03-11 (×2): 1 [IU] via SUBCUTANEOUS

## 2022-03-11 MED ORDER — ROPINIROLE HCL 1 MG PO TABS
2.0000 mg | ORAL_TABLET | Freq: Every evening | ORAL | Status: DC | PRN
  Administered 2022-03-11 – 2022-03-13 (×4): 2 mg via ORAL
  Filled 2022-03-11 (×3): qty 2

## 2022-03-11 MED ORDER — ACETAMINOPHEN 650 MG RE SUPP: 650.0000 mg | Freq: Four times a day (QID) | RECTAL | Status: DC | PRN

## 2022-03-11 MED ORDER — DOXAZOSIN MESYLATE 2 MG PO TABS
2.0000 mg | ORAL_TABLET | Freq: Every day | ORAL | Status: DC
  Administered 2022-03-11 – 2022-03-14 (×4): 2 mg via ORAL
  Filled 2022-03-11 (×4): qty 1

## 2022-03-11 NOTE — Inpatient Diabetes Management (Signed)
Inpatient Diabetes Program Recommendations  AACE/ADA: New Consensus Statement on Inpatient Glycemic Control (2015)  Target Ranges:  Prepandial:   less than 140 mg/dL      Peak postprandial:   less than 180 mg/dL (1-2 hours)      Critically ill patients:  140 - 180 mg/dL   Lab Results  Component Value Date   GLUCAP 187 (H) 03/11/2022   HGBA1C 5.7 (H) 02/22/2022    Review of Glycemic Control  Latest Reference Range & Units 03/10/22 09:33 03/10/22 21:15 03/11/22 12:01  Glucose-Capillary 70 - 99 mg/dL 187 (H) 221 (H) 187 (H)   Diabetes history: DM 2 Outpatient Diabetes medications: Levemir 25 units BID, Novolog 10-20 units BID, ozempic 1 mg weekly  Current orders for Inpatient glycemic control:  Novolog 0-6 units tid with meals  Inpatient Diabetes Program Recommendations:    May need low dose basal insulin added since patient was on basal insulin at home.  Will follow.   Thanks,  Adah Perl, RN, BC-ADM Inpatient Diabetes Coordinator Pager [phone]  (8a-5p)

## 2022-03-11 NOTE — Progress Notes (Signed)
[facility] Progress Note  Patient: [name]     YDX:[id number]  DOA: 03/10/2022   PCP: Crist Infante, MD       Brief hospital course: This is a 71 year old female with diabetes mellitus, chronic kidney disease and anxiety and depression who presents to the hospital for generalized weakness and is found to be hypotensive. The patient states that she has been having diarrhea for 3 to 4 days with some lower abdominal pain.  She has not noticed any blood in her stools.  She was recently hospitalized for pneumonia and was discharged on 12/9.  Her PCP started her on oral vancomycin for her diarrhea and she took this for 4 to 5 days but did not have any improvement.  Subjective:  She had loose stools today.  No abdominal pain and no vomiting.  Assessment and Plan: Principal Problem:   AKI (acute kidney injury)   Hypotension   Dehydration   Stage 3a chronic kidney disease (CKD) (HCC) -Creatinine was 2.08 followed by 2.2 - has subsequently improved to 1.43 - Continue IV fluids until diarrhea resolves and oral intake improves  Active Problems:   Diarrhea with leukopenia and thrombocytopenia -Stool studies have been ordered and are pending -She appears to be chronically leukopenic with a WBC count around 3-4 at baseline but WBC is now 2.4 -Platelet count is 100  Anemia - Albumin level is 1.9 today - This is likely secondary to poor oral intake and diarrhea    Hypertension -She receives amlodipine and metoprolol at home    Hyperthyroidism -Continue PTU    GERD (gastroesophageal reflux disease) -Continue daily PPI    Diabetes mellitus type 2 in obese ([place]) - Continue insulin    Depression with anxiety - Continue as needed clonazepam    Code Status: Full Code Consultants: None Level of Care: Level of care: Telemetry Total time on patient care: 35 minutes DVT prophylaxis:  SCDs Start: 03/11/22 0825     Objective:   Vitals:   03/10/22 2030 03/10/22 2133  03/11/22 0535 03/11/22 0934  BP: (!) 128/37 (!) 124/49 139/68 (!) 106/33  Pulse: 80 80 89 88  Resp: '18 19 19 20  '$ Temp: 98.3 F (36.8 C) 98.1 F (36.7 C) 99.5 F (37.5 C) 98.4 F (36.9 C)  TempSrc: Oral  Oral Oral  SpO2: 94% 97% 100% 99%   There were no vitals filed for this visit. Exam: General exam: Appears comfortable  HEENT: oral mucosa moist Respiratory system: Clear to auscultation.  Cardiovascular system: S1 & S2 heard  Gastrointestinal system: Abdomen soft, non-tender, moderately distended. Normal bowel sounds   Extremities: No cyanosis, clubbing or edema Psychiatry:  Mood & affect appropriate.      CBC: Recent Labs  Lab 03/10/22 0917 03/10/22 0931 03/11/22 0850 03/11/22 0852  WBC 3.0*  --  2.4* 2.4*  NEUTROABS 2.4  --   --   --   HGB 9.1* 8.8* 9.6* 9.6*  HCT 30.0* 26.0* 31.2* 30.9*  MCV 89.6  --  87.9 87.0  PLT 114*  --  100* 720*   Basic Metabolic Panel: Recent Labs  Lab 03/10/22 0917 03/10/22 0931 03/11/22 0850 03/11/22 0852  NA 135 135 141 140  K 4.7 5.0 4.6 4.5  CL 107 106 111 110  CO2 20*  --  23 23  GLUCOSE 210* 206* 186* 182*  BUN 66* 70* 42* 41*  CREATININE 2.08* 2.20* 1.43* 1.43*  CALCIUM 8.0*  --  8.2* 8.0*   GFR: Estimated  Creatinine Clearance: 38.6 mL/min (A) (by C-G formula based on SCr of 1.43 mg/dL (H)).  Scheduled Meds:  insulin aspart  0-6 Units Subcutaneous TID WC   Continuous Infusions:  sodium chloride 100 mL/hr at 03/10/22 2148   Imaging and lab data was personally reviewed US RENAL  Result Date: 03/11/2022 CLINICAL DATA:  Acute kidney injury EXAM: RENAL / URINARY TRACT ULTRASOUND COMPLETE COMPARISON:  CT abdomen pelvis 10/06/2021 FINDINGS: Right Kidney: Renal measurements: 10.5 x 6.4 x 4.7 cm = volume: 201 mL. Echogenicity within normal limits. No mass or hydronephrosis visualized. Left Kidney: Renal measurements: 9.3 x 4.8 x 4.2 cm = volume: 99 mL. Echogenicity within normal limits. No mass or hydronephrosis visualized.  Bladder: Appears normal for degree of bladder distention. Other: Small bilateral renal calculi identified on CT not visualized on ultrasound. Limited exam due to body habitus and inability of the patient to hold their breath. IMPRESSION: No hydronephrosis. Limited exam due to body habitus and inability of the patient to hold their breath. Small bilateral renal calculi identified on CT not visualized on ultrasound. Electronically Signed   By: Franchot Gallo M.D.   On: 03/11/2022 10:43   DG Chest Portable 1 View  Result Date: 03/10/2022 CLINICAL DATA:  71 year old female presents for evaluation of hypotension and weakness. EXAM: PORTABLE CHEST 1 VIEW COMPARISON:  February 23, 2022 FINDINGS: Kg leads project over the chest. Signs of spinal fusion partially imaged in the upper lumbar and lower thoracic spine. RIGHT shoulder arthroplasty. Cardiomediastinal contours and hilar structures are stable, still with signs of cardiomegaly accentuated by AP projection. Improved aeration in the chest and improved interstitial and airspace disease still with persistent interstitial and airspace process in the RIGHT upper lobe which is greatly improved. No signs of pneumothorax. On limited assessment no acute skeletal process. IMPRESSION: Dramatic improvement with respect to the appearance of the chest with improved aeration globally and with diminished interstitial airspace disease still with mild persistent process in the RIGHT upper lobe. Electronically Signed   By: Zetta Bills M.D.   On: 03/10/2022 09:36    LOS: 0 days   Author: Debbe Odea  03/11/2022 2:19 PM  To contact [facility]>   Check the care team in [facility] and look for the attending/consulting [place] provider listed  Log into [email or web address] and use [place]'s universal password   Go to> "[facility]"  and find provider  If you still have difficulty reaching the provider, please page the [facility] (Director on Call) for the Hospitalists listed on  amion

## 2022-03-12 DIAGNOSIS — I9589 Other hypotension: Secondary | ICD-10-CM | POA: Diagnosis not present

## 2022-03-12 DIAGNOSIS — E861 Hypovolemia: Secondary | ICD-10-CM | POA: Diagnosis not present

## 2022-03-12 DIAGNOSIS — N179 Acute kidney failure, unspecified: Secondary | ICD-10-CM | POA: Diagnosis not present

## 2022-03-12 LAB — CBC
HCT: 28.4 % — ABNORMAL LOW (ref 36.0–46.0)
Hemoglobin: 8.7 g/dL — ABNORMAL LOW (ref 12.0–15.0)
MCH: 27.3 pg (ref 26.0–34.0)
MCHC: 30.6 g/dL (ref 30.0–36.0)
MCV: 89 fL (ref 80.0–100.0)
Platelets: 90 10*3/uL — ABNORMAL LOW (ref 150–400)
RBC: 3.19 MIL/uL — ABNORMAL LOW (ref 3.87–5.11)
RDW: 15.5 % (ref 11.5–15.5)
WBC: 2.9 10*3/uL — ABNORMAL LOW (ref 4.0–10.5)
nRBC: 0 % (ref 0.0–0.2)

## 2022-03-12 LAB — GLUCOSE, CAPILLARY
Glucose-Capillary: 115 mg/dL — ABNORMAL HIGH (ref 70–99)
Glucose-Capillary: 130 mg/dL — ABNORMAL HIGH (ref 70–99)
Glucose-Capillary: 140 mg/dL — ABNORMAL HIGH (ref 70–99)
Glucose-Capillary: 149 mg/dL — ABNORMAL HIGH (ref 70–99)

## 2022-03-12 NOTE — Progress Notes (Signed)
  Transition of Care [facility]) Screening Note   Patient Details  Name: [name] Date of Birth: 1950-05-16   Transition of Care [facility]) CM/SW Contact:    Vassie Moselle, [place] Phone Number: 03/12/2022, 11:51 AM    Transition of Care Department [facility]) has reviewed patient and no TOC needs have been identified at this time. We will continue to monitor patient advancement through interdisciplinary progression rounds. If new patient transition needs arise, please place a TOC consult.

## 2022-03-12 NOTE — Progress Notes (Signed)
[facility] Progress Note  Patient: [name]     JGG:[id number]  DOA: 03/10/2022   PCP: Crist Infante, MD       Brief hospital course: This is a 71 year old female with diabetes mellitus, chronic kidney disease and anxiety and depression who presents to the hospital for generalized weakness and is found to be hypotensive. The patient states that she has been having diarrhea for 3 to 4 days with some lower abdominal pain.  She has not noticed any blood in her stools.  She was recently hospitalized for pneumonia and was discharged on 12/9.  Her PCP started her on oral vancomycin for her diarrhea and she took this for 4 to 5 days but did not have any improvement.  Subjective:  She continues to have loose stools today and has had 3 bowel movements already this morning.  Stool output has slowed down compared to when she was first admitted.  Her appetite is poor.  Assessment and Plan: Principal Problem:   AKI (acute kidney injury)   Hypotension   Dehydration   Stage 3a chronic kidney disease (CKD) (HCC) -Creatinine was 2.08 followed by 2.2 - has subsequently improved to 1. 29 - Continue IV fluids until diarrhea resolves and oral intake improves  Active Problems:   Diarrhea with leukopenia and thrombocytopenia -Stool studies have been ordered- she is negative for C. difficile colitis - Awaiting GI pathogen panel which has not yet been sent -She appears to be chronically leukopenic with a WBC count around 3-4 at baseline but WBC is now~2 -Platelet count is 90 -Drop in WBC count and platelets may be secondary to a viral infection - Continue to follow for improvement  Hypoalbuminemia - Albumin level is 1.9  - This is likely secondary to poor oral intake and diarrhea    Hypertension -She receives amlodipine and metoprolol at home    Hyperthyroidism -Continue PTU    GERD (gastroesophageal reflux disease) -Continue daily PPI    Diabetes mellitus type 2 in obese (HCC) -  Continue insulin    Depression with anxiety - Continue as needed clonazepam    Code Status: Full Code Consultants: None Level of Care: Level of care: Telemetry Total time on patient care: 35 minutes DVT prophylaxis:  SCDs Start: 03/11/22 0825     Objective:   Vitals:   03/11/22 0934 03/11/22 1515 03/11/22 2037 03/12/22 0431  BP: (!) 106/33 (!) 134/57 (!) 130/53 (!) 137/52  Pulse: 88 97 91 77  Resp: '20 16 20 20  '$ Temp: 98.4 F (36.9 C) 99 F (37.2 C) 98.4 F (36.9 C) 97.6 F (36.4 C)  TempSrc: Oral  Oral Oral  SpO2: 99% 96% 93% 95%  Weight:      Height:       Filed Weights   03/10/22 2133  Weight: 96.4 kg   Exam: General exam: Appears comfortable  HEENT: oral mucosa moist Respiratory system: Clear to auscultation.  Cardiovascular system: S1 & S2 heard  Gastrointestinal system: Abdomen soft, tender in mid abdomen, nondistended. Normal bowel sounds   Extremities: No cyanosis, clubbing or edema Psychiatry: Flat affect, depressed mood    CBC: Recent Labs  Lab 03/10/22 0917 03/10/22 0931 03/11/22 0850 03/11/22 0852 03/12/22 0518  WBC 3.0*  --  2.4* 2.4* 2.9*  NEUTROABS 2.4  --   --   --   --   HGB 9.1* 8.8* 9.6* 9.6* 8.7*  HCT 30.0* 26.0* 31.2* 30.9* 28.4*  MCV 89.6  --  87.9 87.0 89.0  PLT 114*  --  100* 100* 90*    Basic Metabolic Panel: Recent Labs  Lab 03/10/22 0917 03/10/22 0931 03/11/22 0850 03/11/22 0852 03/11/22 1452  NA 135 135 141 140 140  K 4.7 5.0 4.6 4.5 4.9  CL 107 106 111 110 110  CO2 20*  --  23 23 21*  GLUCOSE 210* 206* 186* 182* 205*  BUN 66* 70* 42* 41* 34*  CREATININE 2.08* 2.20* 1.43* 1.43* 1.29*  CALCIUM 8.0*  --  8.2* 8.0* 8.4*    GFR: Estimated Creatinine Clearance: 42.8 mL/min (A) (by C-G formula based on SCr of 1.29 mg/dL (H)).  Scheduled Meds:  clonazePAM  0.5 mg Oral QHS   doxazosin  2 mg Oral Daily   fluticasone furoate-vilanterol  1 puff Inhalation Daily   insulin aspart  0-6 Units Subcutaneous TID WC    insulin detemir  10 Units Subcutaneous QHS   metoprolol tartrate  50 mg Oral TID   oxybutynin  10 mg Oral Daily   pantoprazole  40 mg Oral Daily   propylthiouracil  50 mg Oral Daily   QUEtiapine  50 mg Oral QHS   rOPINIRole  2 mg Oral QHS,MR X 1   Continuous Infusions:  sodium chloride 100 mL/hr at 03/10/22 2148   Imaging and lab data was personally reviewed US RENAL  Result Date: 03/11/2022 CLINICAL DATA:  Acute kidney injury EXAM: RENAL / URINARY TRACT ULTRASOUND COMPLETE COMPARISON:  CT abdomen pelvis 10/06/2021 FINDINGS: Right Kidney: Renal measurements: 10.5 x 6.4 x 4.7 cm = volume: 201 mL. Echogenicity within normal limits. No mass or hydronephrosis visualized. Left Kidney: Renal measurements: 9.3 x 4.8 x 4.2 cm = volume: 99 mL. Echogenicity within normal limits. No mass or hydronephrosis visualized. Bladder: Appears normal for degree of bladder distention. Other: Small bilateral renal calculi identified on CT not visualized on ultrasound. Limited exam due to body habitus and inability of the patient to hold their breath. IMPRESSION: No hydronephrosis. Limited exam due to body habitus and inability of the patient to hold their breath. Small bilateral renal calculi identified on CT not visualized on ultrasound. Electronically Signed   By: Franchot Gallo M.D.   On: 03/11/2022 10:43    LOS: 1 day   Author: Debbe Odea  03/12/2022 11:03 AM  To contact [facility]>   Check the care team in [facility] and look for the attending/consulting [place] provider listed  Log into [email or web address] and use [place]'s universal password   Go to> "[facility]"  and find provider  If you still have difficulty reaching the provider, please page the [facility] (Director on Call) for the Hospitalists listed on amion

## 2022-03-13 DIAGNOSIS — E861 Hypovolemia: Secondary | ICD-10-CM | POA: Diagnosis not present

## 2022-03-13 DIAGNOSIS — N179 Acute kidney failure, unspecified: Secondary | ICD-10-CM | POA: Diagnosis not present

## 2022-03-13 DIAGNOSIS — I9589 Other hypotension: Secondary | ICD-10-CM | POA: Diagnosis not present

## 2022-03-13 LAB — BASIC METABOLIC PANEL
Anion gap: 6 (ref 5–15)
BUN: 17 mg/dL (ref 8–23)
CO2: 24 mmol/L (ref 22–32)
Calcium: 8.1 mg/dL — ABNORMAL LOW (ref 8.9–10.3)
Chloride: 111 mmol/L (ref 98–111)
Creatinine, Ser: 1.18 mg/dL — ABNORMAL HIGH (ref 0.44–1.00)
GFR, Estimated: 50 mL/min — ABNORMAL LOW (ref >60)
Glucose, Bld: 90 mg/dL (ref 70–99)
Potassium: 4.4 mmol/L (ref 3.5–5.1)
Sodium: 141 mmol/L (ref 135–145)

## 2022-03-13 LAB — CBC
HCT: 27.6 % — ABNORMAL LOW (ref 36.0–46.0)
Hemoglobin: 8.5 g/dL — ABNORMAL LOW (ref 12.0–15.0)
MCH: 27.2 pg (ref 26.0–34.0)
MCHC: 30.8 g/dL (ref 30.0–36.0)
MCV: 88.2 fL (ref 80.0–100.0)
Platelets: 98 10*3/uL — ABNORMAL LOW (ref 150–400)
RBC: 3.13 MIL/uL — ABNORMAL LOW (ref 3.87–5.11)
RDW: 15.5 % (ref 11.5–15.5)
WBC: 3.8 10*3/uL — ABNORMAL LOW (ref 4.0–10.5)
nRBC: 0 % (ref 0.0–0.2)

## 2022-03-13 LAB — GLUCOSE, CAPILLARY
Glucose-Capillary: 80 mg/dL (ref 70–99)
Glucose-Capillary: 101 mg/dL — ABNORMAL HIGH (ref 70–99)
Glucose-Capillary: 107 mg/dL — ABNORMAL HIGH (ref 70–99)
Glucose-Capillary: 124 mg/dL — ABNORMAL HIGH (ref 70–99)

## 2022-03-13 MED ORDER — INSULIN DETEMIR 100 UNIT/ML ~~LOC~~ SOLN
8.0000 [IU] | Freq: Every day | SUBCUTANEOUS | Status: DC
  Administered 2022-03-13: 8 [IU] via SUBCUTANEOUS
  Filled 2022-03-13 (×2): qty 0.08

## 2022-03-13 MED ORDER — ONDANSETRON HCL 4 MG PO TABS: 4.0000 mg | ORAL_TABLET | Freq: Three times a day (TID) | ORAL | 0 refills | Status: DC | PRN

## 2022-03-13 NOTE — Progress Notes (Signed)
[facility] Progress Note  Patient: [name]     IRJ:[id number]  DOA: 03/10/2022   PCP: Crist Infante, MD       Brief hospital course: This is a 71 year old female with diabetes mellitus, chronic kidney disease and anxiety and depression who presents to the hospital for generalized weakness and is found to be hypotensive. The patient states that she has been having diarrhea for 3 to 4 days with some lower abdominal pain.  She has not noticed any blood in her stools.  She was recently hospitalized for pneumonia and was discharged on 12/9.  Her PCP started her on oral vancomycin for her diarrhea and she took this for 4 to 5 days but did not have any improvement.  Subjective:  The patient only has had 1 loose stool today.  She continues to have nausea and was not able to eat any breakfast this morning.  She is severely weak and having trouble ambulating  Assessment and Plan: Principal Problem:   AKI (acute kidney injury)   Hypotension   Dehydration   Stage 3a chronic kidney disease (CKD) (HCC) -Creatinine was 2.08-it is steadily improving - Diarrhea has improved however oral intake remains poor-we will continue to follow her in the hospital until she is eating and drinking -Continue Zofran as needed for nausea -He is also severely fatigued and will need physical therapy evaluation  Active Problems:   Diarrhea with leukopenia and thrombocytopenia -Stool studies have been ordered- she is negative for C. difficile colitis - Awaiting GI pathogen panel which has not yet been sent -She appears to be chronically leukopenic with a WBC count around 3-4 at baseline but WBC is now~2 -Platelet count is 90 -Drop in WBC count and platelets may be secondary to a viral infection - WBC count has improved from 2.9-3.8 today - Platelets are 98 today  Hypoalbuminemia - Albumin level is 1.9  - This is likely secondary to poor oral intake and diarrhea    Hypertension -She receives  amlodipine and metoprolol at home    Hyperthyroidism -Continue PTU    GERD (gastroesophageal reflux disease) -Continue daily PPI    Diabetes mellitus type 2 in obese ([place]) - Continue insulin    Depression with anxiety - Continue as needed clonazepam    Code Status: Full Code Consultants: None Level of Care: Level of care: Telemetry Total time on patient care: 35 minutes DVT prophylaxis:  SCDs Start: 03/11/22 0825     Objective:   Vitals:   03/13/22 0452 03/13/22 0831 03/13/22 1256 03/13/22 1257  BP: (!) 147/56 (!) 150/67 (!) 129/51 (!) 129/51  Pulse: 89 83 81 81  Resp: '20 20 20 20  '$ Temp: 98.9 F (37.2 C) 98.1 F (36.7 C) 98.6 F (37 C) 98.6 F (37 C)  TempSrc: Oral Oral Oral Oral  SpO2: 94% 98% 92%   Weight:      Height:       Filed Weights   03/10/22 2133  Weight: 96.4 kg   Exam: General exam: Appears comfortable  HEENT: oral mucosa moist Respiratory system: Clear to auscultation.  Cardiovascular system: S1 & S2 heard  Gastrointestinal system: Abdomen soft, non-tender, nondistended. Normal bowel sounds   Extremities: No cyanosis, clubbing or edema Psychiatry:  Mood & affect appropriate.      CBC: Recent Labs  Lab 03/10/22 0917 03/10/22 0931 03/11/22 0850 03/11/22 0852 03/12/22 0518 03/13/22 0705  WBC 3.0*  --  2.4* 2.4* 2.9* 3.8*  NEUTROABS 2.4  --   --   --   --   --  HGB 9.1* 8.8* 9.6* 9.6* 8.7* 8.5*  HCT 30.0* 26.0* 31.2* 30.9* 28.4* 27.6*  MCV 89.6  --  87.9 87.0 89.0 88.2  PLT 114*  --  100* 100* 90* 98*    Basic Metabolic Panel: Recent Labs  Lab 03/10/22 0917 03/10/22 0931 03/11/22 0850 03/11/22 0852 03/11/22 1452 03/13/22 0705  NA 135 135 141 140 140 141  K 4.7 5.0 4.6 4.5 4.9 4.4  CL 107 106 111 110 110 111  CO2 20*  --  23 23 21* 24  GLUCOSE 210* 206* 186* 182* 205* 90  BUN 66* 70* 42* 41* 34* 17  CREATININE 2.08* 2.20* 1.43* 1.43* 1.29* 1.18*  CALCIUM 8.0*  --  8.2* 8.0* 8.4* 8.1*    GFR: Estimated Creatinine  Clearance: 46.8 mL/min (A) (by C-G formula based on SCr of 1.18 mg/dL (H)).  Scheduled Meds:  clonazePAM  0.5 mg Oral QHS   doxazosin  2 mg Oral Daily   fluticasone furoate-vilanterol  1 puff Inhalation Daily   insulin aspart  0-6 Units Subcutaneous TID WC   insulin detemir  10 Units Subcutaneous QHS   metoprolol tartrate  50 mg Oral TID   oxybutynin  10 mg Oral Daily   pantoprazole  40 mg Oral Daily   propylthiouracil  50 mg Oral Daily   QUEtiapine  50 mg Oral QHS   rOPINIRole  2 mg Oral QHS,MR X 1   Continuous Infusions:   Imaging and lab data was personally reviewed No results found.  LOS: 2 days   Author: Debbe Odea  03/13/2022 1:01 PM  To contact [facility]>   Check the care team in [facility] and look for the attending/consulting [facility] provider listed  Log into [email or web address] and use [place]'s universal password   Go to> "[facility]"  and find provider  If you still have difficulty reaching the provider, please page the [facility] (Director on Call) for the Hospitalists listed on amion

## 2022-03-14 DIAGNOSIS — E1169 Type 2 diabetes mellitus with other specified complication: Secondary | ICD-10-CM

## 2022-03-14 DIAGNOSIS — E861 Hypovolemia: Secondary | ICD-10-CM | POA: Diagnosis not present

## 2022-03-14 DIAGNOSIS — N179 Acute kidney failure, unspecified: Secondary | ICD-10-CM | POA: Diagnosis not present

## 2022-03-14 DIAGNOSIS — E669 Obesity, unspecified: Secondary | ICD-10-CM

## 2022-03-14 DIAGNOSIS — I9589 Other hypotension: Secondary | ICD-10-CM | POA: Diagnosis not present

## 2022-03-14 LAB — GASTROINTESTINAL PANEL BY PCR, STOOL (REPLACES STOOL CULTURE)

## 2022-03-14 LAB — BASIC METABOLIC PANEL
Anion gap: 6 (ref 5–15)
BUN: 15 mg/dL (ref 8–23)
CO2: 24 mmol/L (ref 22–32)
Calcium: 8.5 mg/dL — ABNORMAL LOW (ref 8.9–10.3)
Chloride: 108 mmol/L (ref 98–111)
Creatinine, Ser: 1.08 mg/dL — ABNORMAL HIGH (ref 0.44–1.00)
GFR, Estimated: 56 mL/min — ABNORMAL LOW (ref >60)
Glucose, Bld: 115 mg/dL — ABNORMAL HIGH (ref 70–99)
Potassium: 4.2 mmol/L (ref 3.5–5.1)
Sodium: 138 mmol/L (ref 135–145)

## 2022-03-14 LAB — CBC
HCT: 27 % — ABNORMAL LOW (ref 36.0–46.0)
Hemoglobin: 8.5 g/dL — ABNORMAL LOW (ref 12.0–15.0)
MCH: 27.4 pg (ref 26.0–34.0)
MCHC: 31.5 g/dL (ref 30.0–36.0)
MCV: 87.1 fL (ref 80.0–100.0)
Platelets: 108 10*3/uL — ABNORMAL LOW (ref 150–400)
RBC: 3.1 MIL/uL — ABNORMAL LOW (ref 3.87–5.11)
RDW: 15.3 % (ref 11.5–15.5)
WBC: 3.5 10*3/uL — ABNORMAL LOW (ref 4.0–10.5)
nRBC: 0.6 % — ABNORMAL HIGH (ref 0.0–0.2)

## 2022-03-14 LAB — GLUCOSE, CAPILLARY
Glucose-Capillary: 108 mg/dL — ABNORMAL HIGH (ref 70–99)
Glucose-Capillary: 105 mg/dL — ABNORMAL HIGH (ref 70–99)

## 2022-03-14 NOTE — Evaluation (Signed)
Physical Therapy Evaluation Patient Details Name: [name] MRN: [id number] DOB: 01-08-1951 Today's Date: 03/14/2022  History of Present Illness  Pt admitted from home 2* weakness, diarrhea, and hypotension and admit to hospital earlier this month with PNA.  Pt with hx of C-1 fx, Bells palsy, R RTSR, Graves dz, migraine, obesity, sciatica, lumbar fusion, and peripheral neuropathy.  Clinical Impression  Pt admitted as above and presenting with functional mobility limitations 2* generalized weakness, mild ambulatory balance deficits and limited endurance.  This date pt up from chair to ambulate limited distance in room - ltd by fatigue and O2 sat at 88% on RA.  Pt BP supine 136/69; sitting 148/66; standing 136/66; standing 3 min 155/67 and after ambulating short distance in room 155/70 - RN aware.     Recommendations for follow up therapy are one component of a multi-disciplinary discharge planning process, led by the attending physician.  Recommendations may be updated based on patient status, additional functional criteria and insurance authorization.  Follow Up Recommendations Home health PT      Assistance Recommended at Discharge Intermittent Supervision/Assistance  Patient can return home with the following  A lot of help with walking and/or transfers;A little help with bathing/dressing/bathroom;Assistance with cooking/housework;Assist for transportation;Help with stairs or ramp for entrance    Equipment Recommendations None recommended by PT  Recommendations for Other Services  OT consult    Functional Status Assessment Patient has had a recent decline in their functional status and demonstrates the ability to make significant improvements in function in a reasonable and predictable amount of time.     Precautions / Restrictions Precautions Precautions: Fall;Other (comment) Precaution Comments: Watch SpO2 (does not wear O2 baseline) Restrictions Weight Bearing Restrictions:  No      Mobility  Bed Mobility               General bed mobility comments: Pt up in chair and requests back to same    Transfers Overall transfer level: Needs assistance Equipment used: Rolling walker (2 wheels) Transfers: Sit to/from Stand Sit to Stand: Min guard           General transfer comment: steady assist with cues for use of UEs to self assist    Ambulation/Gait Ambulation/Gait assistance: Min guard Gait Distance (Feet): 5 Feet Assistive device: Rolling walker (2 wheels) Gait Pattern/deviations: Step-to pattern, Step-through pattern, Decreased step length - right, Decreased step length - left, Shuffle, Trunk flexed Gait velocity: Decreased     General Gait Details: Steady assist, increased time,cues for posture and position from RW; distance ltd by fatigue - O2 sat 88% on RA  Stairs            Wheelchair Mobility    Modified Rankin (Stroke Patients Only)       Balance Overall balance assessment: Needs assistance Sitting-balance support: No upper extremity supported, Feet supported Sitting balance-Leahy Scale: Good     Standing balance support: Single extremity supported Standing balance-Leahy Scale: Poor                               Pertinent Vitals/Pain Pain Assessment Pain Assessment: No/denies pain    Home Living Family/patient expects to be discharged to:: Private residence Living Arrangements: Spouse/significant other Available Help at Discharge: Family Type of Home: House Home Access: Stairs to enter Entrance Stairs-Rails: Psychiatric nurse of Steps: 1+1   Home Layout: One level Home Equipment: Advice worker (2 wheels);BSC/3in1;Rollator (  4 wheels)      Prior Function Prior Level of Function : Needs assist             Mobility Comments: uses walker for ambulation to descend steps going into home; pt has been limited and progressing slowly with regaining strength since  recent PNA dx       Hand Dominance   Dominant Hand: Right    Extremity/Trunk Assessment   Upper Extremity Assessment Upper Extremity Assessment: Generalized weakness RUE Deficits / Details: longstanding limitation from previous dislocation    Lower Extremity Assessment Lower Extremity Assessment: Generalized weakness       Communication   Communication: No difficulties  Cognition Arousal/Alertness: Awake/alert Behavior During Therapy: WFL for tasks assessed/performed Overall Cognitive Status: Within Functional Limits for tasks assessed                               Problem Solving: Slow processing General Comments: WFL for simple tasks, not formally assessed        General Comments      Exercises     Assessment/Plan    PT Assessment Patient needs continued PT services  PT Problem List Decreased strength;Decreased activity tolerance;Decreased balance;Decreased mobility;Cardiopulmonary status limiting activity       PT Treatment Interventions DME instruction;Gait training;Stair training;Functional mobility training;Therapeutic activities;Therapeutic exercise;Balance training;Patient/family education    PT Goals (Current goals can be found in the Care Plan section)  Acute Rehab PT Goals Patient Stated Goal: HOME PT Goal Formulation: With patient Time For Goal Achievement: 03/10/22 Potential to Achieve Goals: Good    Frequency Min 3X/week     Co-evaluation               AM-PAC PT "6 Clicks" Mobility  Outcome Measure Help needed turning from your back to your side while in a flat bed without using bedrails?: A Lot Help needed moving from lying on your back to sitting on the side of a flat bed without using bedrails?: A Lot Help needed moving to and from a bed to a chair (including a wheelchair)?: A Little Help needed standing up from a chair using your arms (e.g., wheelchair or bedside chair)?: A Little Help needed to walk in hospital  room?: Total Help needed climbing 3-5 steps with a railing? : Total 6 Click Score: 12    End of Session Equipment Utilized During Treatment: Gait belt Activity Tolerance: Patient limited by fatigue Patient left: in chair;with call bell/phone within reach;with family/visitor present Nurse Communication: Mobility status PT Visit Diagnosis: Difficulty in walking, not elsewhere classified (R26.2);Unsteadiness on feet (R26.81);Muscle weakness (generalized) (M62.81)    Time: 7035-0093 PT Time Calculation (min) (ACUTE ONLY): 25 min   Charges:   PT Evaluation $PT Eval Moderate Complexity: [place] Pager [phone] Office [phone]   Simisola Sandles 03/14/2022, 1:14 PM

## 2022-03-14 NOTE — Discharge Summary (Signed)
Physician Discharge Summary  [name] CZY:[id number] DOB: 1950-07-06 DOA: 03/10/2022  PCP: Crist Infante, MD  Admit date: 03/10/2022 Discharge date: 03/14/2022 Discharging to: home Recommendations for Outpatient Follow-up:  Will need Bmet checked in 1 wk  Consults:  none Procedures:  none   Discharge Diagnoses:   Principal Problem:   AKI (acute kidney injury) - Stage 3a chronic kidney disease (  Active Problems:   Diarrhea   Hypotension   Dehydration   Hyperlipidemia   Hypertension   Hyperthyroidism   GERD (gastroesophageal reflux disease)   Diabetes mellitus type 2 in obese [facility])   Depression with anxiety  Morbid obesity      Hospital Course:  This is a 71 year old female with diabetes mellitus, chronic kidney disease and anxiety and depression who presents to the hospital for generalized weakness and is found to be hypotensive. The patient states that she has been having diarrhea for 3 to 4 days with some lower abdominal pain.  She has not noticed any blood in her stools.  She was recently hospitalized for pneumonia and was discharged on 12/9.  Her PCP started her on oral vancomycin for her diarrhea and she took this for 4 to 5 days but did not have any improvement.  Principal Problem:   AKI (acute kidney injury)   Hypotension   Dehydration   Stage 3a chronic kidney disease (CKD) (HCC) -Creatinine was 2.08 and has improved to 1.08    Active Problems:   Diarrhea with leukopenia and thrombocytopenia -Stool studies have been ordered- she is negative for C. difficile colitis - GI pathogen panel also negative - diarrhea resolved -She appears to be chronically leukopenic with a WBC count around 3-4 at baseline  -Drop in WBC count and platelets may be secondary to a viral infection- noted to be improving -Continue Zofran as needed for nausea - cont PPI - recommend that she hold Ozempic until nausea improves   Hypoalbuminemia - Albumin level is 1.9  - This is  likely secondary to poor oral intake and diarrhea     Hypertension -She receives amlodipine and metoprolol at home     Hyperthyroidism -Continue PTU     GERD (gastroesophageal reflux disease) -Continue daily PPI     Diabetes mellitus type 2 in obese (HCC) - Continue insulin     Depression with anxiety - Continue as needed clonazepam   Morbid Obesity  Body mass index is 41.51 kg/m.           Discharge Instructions  Discharge Instructions     Diet - low sodium heart healthy   Complete by: As directed    Diet - low sodium heart healthy   Complete by: As directed    Diet Carb Modified   Complete by: As directed    Increase activity slowly   Complete by: As directed    Increase activity slowly   Complete by: As directed       Allergies as of 03/14/2022       Reactions   Imitrex [sumatriptan] Other (See Comments)   Vascular spasms   Nsaids Other (See Comments)   PT UNABLE TO TOLERATE NSAID'S DRUGS DUE TO HX OF GASTRIC SLEEVE SURGERY   Statins Other (See Comments)   Leg/muscle pain   Sulfonamide Derivatives Hives   Childhood allergy   Methimazole Hives   Niacin    heart pounding and beating fast.   Codeine Nausea And Vomiting   Promethazine Hcl Other (See Comments)   Restless leg  feeling all over body   Vicodin [hydrocodone-acetaminophen] Nausea And Vomiting        Medication List     STOP taking these medications    Ozempic (1 MG/DOSE) 4 MG/3ML Sopn Generic drug: Semaglutide (1 MG/DOSE)   vancomycin 125 MG capsule Commonly known as: VANCOCIN       TAKE these medications    acetaminophen 650 MG CR tablet Commonly known as: TYLENOL Take 1,300 mg by mouth every 8 (eight) hours as needed for pain.   amLODipine 5 MG tablet Commonly known as: NORVASC Take 5 mg by mouth at bedtime.   BD Insulin Syringe U/F 31G X 5/16" 0.5 ML Misc Generic drug: Insulin Syringe-Needle U-100 USE 5 TIMES A DAY LEVEMIR AND NOVOLOG   BD Pen Needle Micro U/F  32G X 6 MM Misc Generic drug: Insulin Pen Needle Inject 32 pens into the skin daily.   benzonatate 200 MG capsule Commonly known as: TESSALON Take 200 mg by mouth 3 (three) times daily as needed for cough.   clonazePAM 0.5 MG tablet Commonly known as: KLONOPIN Take 0.5 mg by mouth See admin instructions. Take 0.5 mg at bedtime, may take an additional 0.5 mg tablet twice daily as needed for anxiety   Dialyvite Vitamin D 5000 125 MCG (5000 UT) capsule Generic drug: Cholecalciferol Take 5,000 Units by mouth daily.   diclofenac 75 MG EC tablet Commonly known as: VOLTAREN Take 75 mg by mouth 2 (two) times daily.   doxazosin 2 MG tablet Commonly known as: CARDURA Take 2 mg by mouth daily.   fenofibrate 54 MG tablet Take 54 mg by mouth daily with supper.   FLUoxetine 20 MG capsule Commonly known as: PROZAC Take 60 mg by mouth at bedtime.   fluticasone 50 MCG/ACT nasal spray Commonly known as: FLONASE Place 2 sprays into both nostrils daily as needed for allergies or rhinitis.   FREESTYLE TEST STRIPS test strip Generic drug: glucose blood USE STRIPS TO TEST BLOOD GLUCOSE BID   gabapentin 300 MG capsule Commonly known as: NEURONTIN Take 1 capsule (300 mg total) by mouth 3 (three) times daily for 30 days.   hyoscyamine 0.125 MG SL tablet Commonly known as: LEVSIN SL Place 1 tablet (0.125 mg total) under the tongue every 6 (six) hours as needed. What changed: reasons to take this   insulin aspart 100 UNIT/ML FlexPen Commonly known as: NOVOLOG Sliding scale dose: CBG < 70: implement hypoglycemia protocol  CBG 70 - 120: 0 units  CBG 121 - 150: 0 units  CBG 151 - 200: 0 units  CBG 201 - 250: 2 units  CBG 251 - 300: 3 units  CBG 301 - 350: 4 units  CBG 351 - 400: 5 units What changed:  how much to take how to take this when to take this reasons to take this additional instructions   insulin detemir 100 UNIT/ML injection Commonly known as: LEVEMIR Inject 25 Units  into the skin 2 (two) times daily.   metoprolol tartrate 50 MG tablet Commonly known as: LOPRESSOR Take 50 mg by mouth 3 (three) times daily.   omeprazole 20 MG capsule Commonly known as: PRILOSEC Take 20 mg by mouth daily with supper.   ondansetron 4 MG tablet Commonly known as: Zofran Take 1 tablet (4 mg total) by mouth every 8 (eight) hours as needed for nausea or vomiting.   oxybutynin 10 MG 24 hr tablet Commonly known as: DITROPAN-XL Take 10 mg by mouth daily.   propylthiouracil 50 MG  tablet Commonly known as: PTU Take 50 mg by mouth daily.   QUEtiapine 50 MG tablet Commonly known as: SEROQUEL Take 50 mg by mouth at bedtime.   rOPINIRole 2 MG tablet Commonly known as: REQUIP Take 2 mg by mouth See admin instructions. Take 2 mg at bedtime, may take a second 2 mg dose as needed for restless legs   Symbicort 160-4.5 MCG/ACT inhaler Generic drug: budesonide-formoterol Inhale 2 puffs into the lungs 2 (two) times daily.   tiZANidine 4 MG tablet Commonly known as: ZANAFLEX Take 4 mg by mouth 2 (two) times daily as needed for muscle spasms.   Unistik 3 Comfort Misc USE LANCETS TO CHECK BLOOD GLUCOSE BID   Ventolin HFA 108 (90 Base) MCG/ACT inhaler Generic drug: albuterol Inhale 2 puffs into the lungs every 4 (four) hours as needed for shortness of breath.            The results of significant diagnostics from this hospitalization (including imaging, microbiology, ancillary and laboratory) are listed below for reference.    US RENAL  Result Date: 03/11/2022 CLINICAL DATA:  Acute kidney injury EXAM: RENAL / URINARY TRACT ULTRASOUND COMPLETE COMPARISON:  CT abdomen pelvis 10/06/2021 FINDINGS: Right Kidney: Renal measurements: 10.5 x 6.4 x 4.7 cm = volume: 201 mL. Echogenicity within normal limits. No mass or hydronephrosis visualized. Left Kidney: Renal measurements: 9.3 x 4.8 x 4.2 cm = volume: 99 mL. Echogenicity within normal limits. No mass or hydronephrosis  visualized. Bladder: Appears normal for degree of bladder distention. Other: Small bilateral renal calculi identified on CT not visualized on ultrasound. Limited exam due to body habitus and inability of the patient to hold their breath. IMPRESSION: No hydronephrosis. Limited exam due to body habitus and inability of the patient to hold their breath. Small bilateral renal calculi identified on CT not visualized on ultrasound. Electronically Signed   By: Franchot Gallo M.D.   On: 03/11/2022 10:43   DG Chest Portable 1 View  Result Date: 03/10/2022 CLINICAL DATA:  71 year old female presents for evaluation of hypotension and weakness. EXAM: PORTABLE CHEST 1 VIEW COMPARISON:  February 23, 2022 FINDINGS: Kg leads project over the chest. Signs of spinal fusion partially imaged in the upper lumbar and lower thoracic spine. RIGHT shoulder arthroplasty. Cardiomediastinal contours and hilar structures are stable, still with signs of cardiomegaly accentuated by AP projection. Improved aeration in the chest and improved interstitial and airspace disease still with persistent interstitial and airspace process in the RIGHT upper lobe which is greatly improved. No signs of pneumothorax. On limited assessment no acute skeletal process. IMPRESSION: Dramatic improvement with respect to the appearance of the chest with improved aeration globally and with diminished interstitial airspace disease still with mild persistent process in the RIGHT upper lobe. Electronically Signed   By: Zetta Bills M.D.   On: 03/10/2022 09:36   DG Swallowing Func-Speech Pathology  Result Date: 02/25/2022 Table formatting from the original result was not included. Objective Swallowing Evaluation: Type of Study: MBS-Modified Barium Swallow Study  Patient Details Name: ASEEL TRUXILLO MRN: [id number] Date of Birth: April 19, 1950 Today's Date: 02/25/2022 Time: SLP Start Time (ACUTE ONLY): 1235 -SLP Stop Time (ACUTE ONLY): 1255 SLP Time Calculation (min)  (ACUTE ONLY): 20 min Past Medical History: Past Medical History: Diagnosis Date  Anemia   Arthritis   Asthma   OCCAS  Bell's palsy   Broken neck (HCC)   C-1  Carbuncle and furuncle of trunk   Chronic dislocation of right shoulder   Depression  Disorder of fascia   HX OF NECROTIC FASCITIS AFTER ABDOMINAL SURGERY FOR HERNIA- REQUIRED 19 SURGERIES AND 2.5 MONTH HOSPITALIZATION AT [facility]  DM (diabetes mellitus) ([place])   Dysrhythmia   HX OF TACHYCARDIA AND BRADYCARDIA - ON GOING FOR YEARS - DOES NOT HAVE TO SEE CARDIOLOGIST  Elevated cholesterol   Fibromyalgia   Fracture MAY 2014  HX OF FRACTURED NECK C1- CAUSES SEVERE HEADACHES--LIMITED ROM NECK  Frequent infections   ESPECIALLY PRONE TO INFECTIONS AFTER SURGERIES  Graves disease   Heart murmur   DOES NOT CAUSE ANY PROBLEMS  History of kidney stones   Hyperlipidemia   Hypertension   Hypothyroidism   GRAVES DISEASE  Migraine   Nephrolithiasis   STAGE 3    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  Neuropathy   Obesity   OSA (obstructive sleep apnea)   USES CPAP - DOES NOT KNOW SETTING  Pain   LEFT SHOULDER  PAIN -HARD TO LIE ON LEFT SIDE FOR LONG PERIOD;  PAIN IN LOWER BADK - 3 HERNIATED DISCS AND STENOISIS -  PONV (postoperative nausea and vomiting)   THE GAS MAKES ME NAUSEATED  Restless leg syndrome   Sciatica   Tachycardia   Urinary frequency   Urticaria   UTI (urinary tract infection)  Past Surgical History: Past Surgical History: Procedure Laterality Date  ABDOMINAL HYSTERECTOMY    LARGE TUMOR AT OVARY REMOVED  ANTERIOR LAT LUMBAR FUSION N/A 07/28/2018  Procedure: Anterolateral Decompression Lumbar One-Two for osteomyelitis reconstruction w/titanium strut allograft Fusion Lumbar One-Two;  Surgeon: Kristeen Miss, MD;  Location: [place];  Service: Neurosurgery;  Laterality: N/A;  Left anterolateral approach  APPENDECTOMY    APPLICATION OF ROBOTIC ASSISTANCE FOR SPINAL PROCEDURE N/A 11/25/7471  Procedure: APPLICATION OF ROBOTIC ASSISTANCE FOR SPINAL PROCEDURE;  Surgeon: Kristeen Miss,  MD;  Location: [place];  Service: Neurosurgery;  Laterality: N/A;  CARPAL TUNNEL RELEASE    BILATERAL  CATARACT EXTRACTION W/ INTRAOCULAR LENS  IMPLANT, BILATERAL    CESAREAN SECTION    X 3  CHOLECYSTECTOMY    CYSTOSCOPY WITH URETEROSCOPY Right 12/03/2013  Procedure: CYSTOSCOPY WITH RIGHT RETROGRADE URETEROSCOPY LASER LITHOTRIPSY RIGHT STONE RIGHT URETERAL STENT, ;  Surgeon: Raynelle Bring, MD;  Location: [facility];  Service: Urology;  Laterality: Right;  PROCEDURE WAS ORIGINALLY SCHEDULED AS RIGHT PERCUTANEOUS NEPHROLITHOTOMY  EYELID LACERATION REPAIR    RIGHT EYE  HERNIA REPAIR    ABDOMINAL HERNIA REPAIR WITH MESH - 3 SURGERIES   HX OF 19 SURGERIES FOR NECROTIC FASCITIS    SKIN GRAFTS+ WOUND  VAC  I&D OF INFECTED SITE IN [place] - FROM AN INJECTION    IR FLUORO GUIDED NEEDLE PLC ASPIRATION/INJECTION LOC  06/03/2018  JOINT REPLACEMENT    TOTAL RIGHT KNEE REPLACEMENT  KNEE ARTHROSCOPY  RIGHT AND LEFT  X 2  LAPAROSCOPIC GASTRIC SLEEVE RESECTION  2014  NEPHROLITHOTOMY Right 12/10/2013  Procedure: NEPHROLITHOTOMY PERCUTANEOUS;  Surgeon: Raynelle Bring, MD;  Location: [facility];  Service: Urology;  Laterality: Right;  POSTERIOR LUMBAR FUSION 4 LEVEL N/A 07/28/2018  Procedure: Posterior Fixation Thoracic Ten-Lumbar Four with pedicle augmentaion with robotic assistance;  Surgeon: Kristeen Miss, MD;  Location: [place];  Service: Neurosurgery;  Laterality: N/A;  Posterior Fixation Thoracic Ten-Lumbar Four with pedicle augmentaion with robotic assistance  REVERSE SHOULDER ARTHROPLASTY Right 07/21/2017  REVERSE SHOULDER ARTHROPLASTY Right 07/21/2017  Procedure: RIGHT REVERSE SHOULDER ARTHROPLASTY;  Surgeon: Tania Ade, MD;  Location: [place];  Service: Orthopedics;  Laterality: Right;  RIGHT FOOT DRAINAGE OF INFECTION    shoulder arthroscopy Right  X 2  TONSILLECTOMY    AND ADENOIDECTOMY HPI: Patient presents with a mild pharyngeal phase dysphagia characterized by decreased laryngeal closure resulting in trace intermittent penetration of  thin liquid, never reaching the level of the vocal cords and clearing with subsequent swallows/cued throat clear, and moderate-severe post swallow vallecular residue, primarily with regular texture solids.  Subjective: pleasant, said MD told her she may get to discharge tomorrow  Recommendations for follow up therapy are one component of a multi-disciplinary discharge planning process, led by the attending physician.  Recommendations may be updated based on patient status, additional functional criteria and insurance authorization. Assessment / Plan / Recommendation   02/25/2022   2:20 PM Clinical Impressions Clinical Impression Patient presents with a functional oral phase of swallow and a mild pharyngeal, mild cervical esophageal dysphagia. Barium consistencies tested were: thin liquid, puree solid, mechanical soft solid, 13 mm barium tablet. With thin liquids, swallow initiation delays observed at level of the pyriform sinus and with puree and soft solids, delay was observed at level of vallecular sinus. No aspiration observed with any of the tested barium consistencies but 1-2 instances of very shallow, almost imperceptible penetration observed with thin liquids. 13 mm barium tablet transited without difficulty and no stasis at any phase of swallow. Patient did exhibit mild motility slowing with puree and soft solid texture boluses but no significant PO residuals post initial swallow observed. SLP suspects mildly decreased UES opening and decreased cervical esophageal transit but this did not significantly impact patient's swallow funciton or safety. As per this MBS, not likely that her PNA is from oral or pharyngeal swallow function. SLP Visit Diagnosis Dysphagia, pharyngoesophageal phase (R13.14) Impact on safety and function No limitations;Mild aspiration risk     02/25/2022   2:20 PM Treatment Recommendations Treatment Recommendations No treatment recommended at this time     02/25/2022   2:28 PM Prognosis  Prognosis for Safe Diet Advancement Good   02/25/2022   2:20 PM Diet Recommendations SLP Diet Recommendations Regular solids;Thin liquid Liquid Administration via Cup;Straw Medication Administration Whole meds with liquid Compensations Slow rate;Small sips/bites Postural Changes Seated upright at 90 degrees     02/25/2022   2:20 PM Other Recommendations Oral Care Recommendations Oral care BID Follow Up Recommendations No SLP follow up Functional Status Assessment Patient has had a recent decline in their functional status and demonstrates the ability to make significant improvements in function in a reasonable and predictable amount of time.   02/24/2022   3:10 PM Frequency and Duration  Speech Therapy Frequency (ACUTE ONLY) min 1 x/week Treatment Duration 1 week     02/25/2022   2:16 PM Oral Phase Oral Phase [facility]    02/25/2022   2:16 PM Pharyngeal Phase Pharyngeal Phase Impaired Pharyngeal- Thin Cup Delayed swallow initiation-vallecula;Delayed swallow initiation-pyriform sinuses Pharyngeal- Thin Straw Delayed swallow initiation-vallecula Pharyngeal- Puree Delayed swallow initiation-vallecula;Reduced pharyngeal peristalsis Pharyngeal- Mechanical Soft Delayed swallow initiation-vallecula;Reduced pharyngeal peristalsis Pharyngeal- Pill [facility]    02/25/2022   2:20 PM Cervical Esophageal Phase  Cervical Esophageal Phase Impaired Cervical Esophageal Comment mildly reduced UES opening Sonia Baller, MA, CCC-SLP Speech Therapy                     DG Chest Port 1 View  Result Date: 02/23/2022 CLINICAL DATA:  Dyspnea. EXAM: PORTABLE CHEST 1 VIEW COMPARISON:  02/21/2022 FINDINGS: Lordotic technique is demonstrated. Lungs are adequately inflated demonstrate moderate patchy airspace opacification over the right lung most notable over the right upper  lobe and minimal patchy density over the left midlung as these findings are not significantly changed. No definite effusion or pneumothorax. Mild stable cardiomegaly. Remainder of the  exam is unchanged. IMPRESSION: Moderate patchy airspace process over the right lung and minimal patchy density over the left midlung without significant change and likely due to multifocal infection. Electronically Signed   By: Marin Olp M.D.   On: 02/23/2022 09:32   ECHOCARDIOGRAM COMPLETE  Result Date: 02/22/2022    ECHOCARDIOGRAM REPORT   Patient Name:   ARTURO FREUNDLICH Date of Exam: 02/22/2022 Medical Rec #:  [id number]      Height:       60.0 in Accession #:    [id number]     Weight:       225.3 lb Date of Birth:  08/03/1950      BSA:          1.964 m Patient Age:    45 years       BP:           136/55 mmHg Patient Gender: F              HR:           74 bpm. Exam Location:  Inpatient Procedure: 2D Echo, Cardiac Doppler and Color Doppler Indications:    Cardiomyopathy-Unspecified I42.9  History:        Patient has prior history of Echocardiogram examinations, most                 recent 06/05/2018. Signs/Symptoms:Chest Pain; Risk                 Factors:Hypertension, Sleep Apnea, Diabetes and Dyslipidemia.  Sonographer:    Ronny Flurry Referring Phys: [id number] ANKIT CHIRAG AMIN IMPRESSIONS  1. Left ventricular ejection fraction, by estimation, is 55 to 60%. The left ventricle has normal function. The left ventricle has no regional wall motion abnormalities. Left ventricular diastolic parameters are consistent with Grade I diastolic dysfunction (impaired relaxation). Elevated left atrial pressure.  2. Right ventricular systolic function is normal. The right ventricular size is normal.  3. Left atrial size was mildly dilated.  4. The mitral valve is normal in structure. Mild mitral valve regurgitation.  5. The aortic valve is tricuspid. Aortic valve regurgitation is mild.  6. The inferior vena cava is normal in size with greater than 50% respiratory variability, suggesting right atrial pressure of 3 mmHg. Comparison(s): The left ventricular function is unchanged. FINDINGS  Left Ventricle: Left ventricular  ejection fraction, by estimation, is 55 to 60%. The left ventricle has normal function. The left ventricle has no regional wall motion abnormalities. The left ventricular internal cavity size was normal in size. There is  no left ventricular hypertrophy. Left ventricular diastolic parameters are consistent with Grade I diastolic dysfunction (impaired relaxation). Elevated left atrial pressure. Right Ventricle: The right ventricular size is normal. Right vetricular wall thickness was not assessed. Right ventricular systolic function is normal. Left Atrium: Left atrial size was mildly dilated. Right Atrium: Right atrial size was normal in size. Pericardium: There is no evidence of pericardial effusion. Mitral Valve: The mitral valve is normal in structure. Mild mitral valve regurgitation. Tricuspid Valve: The tricuspid valve is normal in structure. Tricuspid valve regurgitation is trivial. Aortic Valve: The aortic valve is tricuspid. Aortic valve regurgitation is mild. Aortic valve mean gradient measures 11.0 mmHg. Aortic valve peak gradient measures 21.0 mmHg. Aortic valve area, by VTI measures 2.18 cm. Pulmonic Valve: The pulmonic  valve was normal in structure. Pulmonic valve regurgitation is not visualized. Aorta: The aortic root is normal in size and structure. Venous: The inferior vena cava is normal in size with greater than 50% respiratory variability, suggesting right atrial pressure of 3 mmHg. IAS/Shunts: No atrial level shunt detected by color flow Doppler.  LEFT VENTRICLE PLAX 2D LVIDd:         5.10 cm   Diastology LVIDs:         3.80 cm   LV e' medial:    5.66 cm/s LV PW:         1.10 cm   LV E/e' medial:  23.0 LV IVS:        1.10 cm   LV e' lateral:   6.74 cm/s LVOT diam:     2.00 cm   LV E/e' lateral: 19.3 LV SV:         108 LV SV Index:   55 LVOT Area:     3.14 cm  RIGHT VENTRICLE RV S prime:     12.50 cm/s TAPSE (M-mode): 2.0 cm LEFT ATRIUM             Index        RIGHT ATRIUM           Index LA  diam:        3.90 cm 1.99 cm/m   RA Area:     15.20 cm LA Vol (A2C):   79.6 ml 40.54 ml/m  RA Volume:   36.10 ml  18.38 ml/m LA Vol (A4C):   81.0 ml 41.25 ml/m LA Biplane Vol: 80.7 ml 41.10 ml/m  AORTIC VALVE AV Area (Vmax):    2.19 cm AV Area (Vmean):   2.22 cm AV Area (VTI):     2.18 cm AV Vmax:           229.00 cm/s AV Vmean:          156.000 cm/s AV VTI:            0.497 m AV Peak Grad:      21.0 mmHg AV Mean Grad:      11.0 mmHg LVOT Vmax:         160.00 cm/s LVOT Vmean:        110.000 cm/s LVOT VTI:          0.345 m LVOT/AV VTI ratio: 0.69  AORTA Ao Root diam: 3.00 cm MITRAL VALVE MV Area (PHT): 3.08 cm     SHUNTS MV Decel Time: 246 msec     Systemic VTI:  0.34 m MV E velocity: 130.00 cm/s  Systemic Diam: 2.00 cm MV A velocity: 151.00 cm/s MV E/A ratio:  0.86 Dorris Carnes MD Electronically signed by Dorris Carnes MD Signature Date/Time: 02/22/2022/4:56:44 PM    Final    CT Angio Chest PE W and/or Wo Contrast  Result Date: 02/22/2022 CLINICAL DATA:  Pulmonary embolism (PE) suspected, high prob. Difficulty breathing. EXAM: CT ANGIOGRAPHY CHEST WITH CONTRAST TECHNIQUE: Multidetector CT imaging of the chest was performed using the standard protocol during bolus administration of intravenous contrast. Multiplanar CT image reconstructions and MIPs were obtained to evaluate the vascular anatomy. RADIATION DOSE REDUCTION: This exam was performed according to the departmental dose-optimization program which includes automated exposure control, adjustment of the mA and/or kV according to patient size and/or use of iterative reconstruction technique. CONTRAST:  98m OMNIPAQUE IOHEXOL 350 MG/ML SOLN COMPARISON:  11/17/2020 FINDINGS: Cardiovascular: No filling defects in the pulmonary arteries to suggest  pulmonary emboli. Heart is normal size. Aorta is normal caliber. Mediastinum/Nodes: Mildly prominent mediastinal lymph nodes. No axillary or hilar adenopathy. Lungs/Pleura: Extensive bilateral airspace disease,  right greater than left. Trace bilateral effusions. Upper Abdomen: No acute findings Musculoskeletal: Chest wall soft tissues are unremarkable. No acute bony abnormality. Review of the MIP images confirms the above findings. IMPRESSION: No evidence of pulmonary embolus. Diffuse bilateral airspace disease, left greater than right most compatible with pneumonia. Trace bilateral effusions. Electronically Signed   By: Rolm Baptise M.D.   On: 02/22/2022 02:11   DG Chest 2 View  Result Date: 02/21/2022 CLINICAL DATA:  Hypoxia and shortness of breath.  Pneumonia. EXAM: CHEST - 2 VIEW COMPARISON:  Two-view chest x-ray 01/08/2017 FINDINGS: Heart is enlarged. Asymmetric interstitial and airspace opacities are present in the right lung. Less prominent airspace disease in the left is worse superiorly. No definite effusions are present. Right shoulder arthroplasty noted. Progressive advanced degenerative changes are present in the left shoulder. IMPRESSION: 1. Asymmetric interstitial and airspace opacities, right greater than left. This is concerning for multifocal pneumonia. 2. Cardiomegaly without failure. Electronically Signed   By: San Morelle M.D.   On: 02/21/2022 17:48   Labs:   Basic Metabolic Panel: Recent Labs  Lab 03/11/22 0850 03/11/22 0852 03/11/22 1452 03/13/22 0705 03/14/22 0607  NA 141 140 140 141 138  K 4.6 4.5 4.9 4.4 4.2  CL 111 110 110 111 108  CO2 23 23 21* 24 24  GLUCOSE 186* 182* 205* 90 115*  BUN 42* 41* 34* 17 15  CREATININE 1.43* 1.43* 1.29* 1.18* 1.08*  CALCIUM 8.2* 8.0* 8.4* 8.1* 8.5*     CBC: Recent Labs  Lab 03/10/22 0917 03/10/22 0931 03/11/22 0850 03/11/22 0852 03/12/22 0518 03/13/22 0705 03/14/22 0607  WBC 3.0*  --  2.4* 2.4* 2.9* 3.8* 3.5*  NEUTROABS 2.4  --   --   --   --   --   --   HGB 9.1*   < > 9.6* 9.6* 8.7* 8.5* 8.5*  HCT 30.0*   < > 31.2* 30.9* 28.4* 27.6* 27.0*  MCV 89.6  --  87.9 87.0 89.0 88.2 87.1  PLT 114*  --  100* 100* 90* 98* 108*    < > = values in this interval not displayed.         SIGNED:   Debbe Odea, MD  [facility] 03/14/2022, 3:03 PM

## 2022-03-14 NOTE — Discharge Instructions (Signed)
Do not take Ozempic until your nausea resolves completely.  Please review all of you discharge paperwork on the day of discharge and be sure you have all of your prescribed medications.  Please request your Primary MD to go over all Hospital Tests and Procedure/Radiological results at the follow up Please get all Hospital records sent to your primary MD by signing hospital release before you go home.   In some cases, there will be blood work, cultures and biopsy results pending at the time of your discharge. Please request that your primary care M.D. goes through all the records of your hospital data and follows up on these results.  Please take all your medications with you for your next visit with your Primary MD   Please request your Primary MD to go over all hospital tests and procedure/radiological results at the follow up, please ask your Primary MD to get all Hospital records sent to his/her office.   You must read complete instructions/literature along with all the possible adverse reactions/side effects for all the Medicines you take and that have been prescribed to you. Take any new Medicines after you have completely understood and accpet all the possible adverse reactions/side effects.    Do not drive or operate heavy machinery when taking Pain medications.    Do not take more than prescribed Pain, Sleep and Anxiety Medications  If you have smoked or chewed Tobacco  in the last 2 yrs please stop smoking, stop any regular Alcohol  and or any Recreational drug use.   Wear Seat belts while driving.   If you had Pneumonia or Lung problems at the Hospital: Please get a 2 view Chest X ray done in 6-8 weeks after hospital discharge or sooner if instructed by your Primary MD.   If you have Congestive Heart Failure: Please call your Cardiologist or Primary MD anytime you have any of the following symptoms:  1) 3 pound weight gain in 24 hours or 5 pounds in 1 week  2) shortness of  breath, with or without a dry hacking cough  3) swelling in the hands, feet or stomach  4) if you have to sleep on extra pillows at night in order to breathe 5) Follow cardiac low salt diet and 1.5 lit/day fluid restriction.   If you have Diabetes Accuchecks 4 times/day- once on AM empty stomach and then before each meal. Log in all results and show them to your primary doctor at your next visit. If any glucose reading is under 60 or above 400 call your primary MD immediately.   If you have Seizure/Convulsions/Epilepsy: Please do not drive, operate heavy machinery, participate in activities at heights or participate in high speed sports until you have seen by Primary MD or a Neurologist and advised to do so again. Per [facility] statutes, patients with seizures are not allowed to drive until they have been seizure-free for six months.  Use caution when using heavy equipment or power tools. Avoid working on ladders or at heights. Take showers instead of baths. Ensure the water temperature is not too high on the home water heater. Do not go swimming alone. Do not lock yourself in a room alone (i.e. bathroom). When caring for infants or small children, sit down when holding, feeding, or changing them to minimize risk of injury to the child in the event you have a seizure. Maintain good sleep hygiene. Avoid alcohol.    If you had Gastrointestinal Bleeding: Please ask your Primary  MD to check a complete blood count within one week of discharge or at your next visit. Your endoscopic/colonoscopic biopsies that are pending at the time of discharge, will also need to followed by your Primary MD.  Please note You were cared for by a hospitalist during your hospital stay. If you have any questions about your discharge medications or the care you received while you were in the hospital after you are discharged, you can call the unit and asked to speak with the hospitalist on call if the hospitalist  that took care of you is not available. Once you are discharged, your primary care physician will handle any further medical issues. Please note that NO REFILLS for any discharge medications will be authorized once you are discharged, as it is imperative that you return to your primary care physician (or establish a relationship with a primary care physician if you do not have one) for your aftercare needs so that they can reassess your need for medications and monitor your lab values.   You can reach the hospitalist office at phone [phone] or fax [phone]   If you do not have a primary care physician, you can call [phone] for a physician referral.

## 2022-03-19 ENCOUNTER — Encounter: Payer: Medicare Other | Admitting: Physical Medicine & Rehabilitation

## 2022-03-19 DIAGNOSIS — E1165 Type 2 diabetes mellitus with hyperglycemia: Secondary | ICD-10-CM | POA: Diagnosis not present

## 2022-03-19 DIAGNOSIS — J189 Pneumonia, unspecified organism: Secondary | ICD-10-CM | POA: Diagnosis not present

## 2022-03-19 DIAGNOSIS — J9601 Acute respiratory failure with hypoxia: Secondary | ICD-10-CM | POA: Diagnosis not present

## 2022-03-19 DIAGNOSIS — I130 Hypertensive heart and chronic kidney disease with heart failure and stage 1 through stage 4 chronic kidney disease, or unspecified chronic kidney disease: Secondary | ICD-10-CM | POA: Diagnosis not present

## 2022-03-19 DIAGNOSIS — E1122 Type 2 diabetes mellitus with diabetic chronic kidney disease: Secondary | ICD-10-CM | POA: Diagnosis not present

## 2022-03-19 DIAGNOSIS — I5032 Chronic diastolic (congestive) heart failure: Secondary | ICD-10-CM | POA: Diagnosis not present

## 2022-03-24 DIAGNOSIS — D649 Anemia, unspecified: Secondary | ICD-10-CM | POA: Diagnosis not present

## 2022-03-24 DIAGNOSIS — J189 Pneumonia, unspecified organism: Secondary | ICD-10-CM | POA: Diagnosis not present

## 2022-03-24 DIAGNOSIS — E1129 Type 2 diabetes mellitus with other diabetic kidney complication: Secondary | ICD-10-CM | POA: Diagnosis not present

## 2022-03-24 DIAGNOSIS — I5032 Chronic diastolic (congestive) heart failure: Secondary | ICD-10-CM | POA: Diagnosis not present

## 2022-03-24 DIAGNOSIS — J45909 Unspecified asthma, uncomplicated: Secondary | ICD-10-CM | POA: Diagnosis not present

## 2022-03-24 DIAGNOSIS — E1122 Type 2 diabetes mellitus with diabetic chronic kidney disease: Secondary | ICD-10-CM | POA: Diagnosis not present

## 2022-03-24 DIAGNOSIS — E039 Hypothyroidism, unspecified: Secondary | ICD-10-CM | POA: Diagnosis not present

## 2022-03-24 DIAGNOSIS — N1831 Chronic kidney disease, stage 3a: Secondary | ICD-10-CM | POA: Diagnosis not present

## 2022-03-24 DIAGNOSIS — E785 Hyperlipidemia, unspecified: Secondary | ICD-10-CM | POA: Diagnosis not present

## 2022-03-24 DIAGNOSIS — E1169 Type 2 diabetes mellitus with other specified complication: Secondary | ICD-10-CM | POA: Diagnosis not present

## 2022-03-24 DIAGNOSIS — G4733 Obstructive sleep apnea (adult) (pediatric): Secondary | ICD-10-CM | POA: Diagnosis not present

## 2022-03-24 DIAGNOSIS — I130 Hypertensive heart and chronic kidney disease with heart failure and stage 1 through stage 4 chronic kidney disease, or unspecified chronic kidney disease: Secondary | ICD-10-CM | POA: Diagnosis not present

## 2022-03-24 DIAGNOSIS — I129 Hypertensive chronic kidney disease with stage 1 through stage 4 chronic kidney disease, or unspecified chronic kidney disease: Secondary | ICD-10-CM | POA: Diagnosis not present

## 2022-03-24 DIAGNOSIS — E1165 Type 2 diabetes mellitus with hyperglycemia: Secondary | ICD-10-CM | POA: Diagnosis not present

## 2022-03-24 DIAGNOSIS — J9601 Acute respiratory failure with hypoxia: Secondary | ICD-10-CM | POA: Diagnosis not present

## 2022-03-25 DIAGNOSIS — I130 Hypertensive heart and chronic kidney disease with heart failure and stage 1 through stage 4 chronic kidney disease, or unspecified chronic kidney disease: Secondary | ICD-10-CM | POA: Diagnosis not present

## 2022-03-25 DIAGNOSIS — J189 Pneumonia, unspecified organism: Secondary | ICD-10-CM | POA: Diagnosis not present

## 2022-03-25 DIAGNOSIS — E1165 Type 2 diabetes mellitus with hyperglycemia: Secondary | ICD-10-CM | POA: Diagnosis not present

## 2022-03-25 DIAGNOSIS — J9601 Acute respiratory failure with hypoxia: Secondary | ICD-10-CM | POA: Diagnosis not present

## 2022-03-25 DIAGNOSIS — E1122 Type 2 diabetes mellitus with diabetic chronic kidney disease: Secondary | ICD-10-CM | POA: Diagnosis not present

## 2022-03-25 DIAGNOSIS — I5032 Chronic diastolic (congestive) heart failure: Secondary | ICD-10-CM | POA: Diagnosis not present

## 2022-03-26 DIAGNOSIS — J9601 Acute respiratory failure with hypoxia: Secondary | ICD-10-CM | POA: Diagnosis not present

## 2022-03-26 DIAGNOSIS — E1122 Type 2 diabetes mellitus with diabetic chronic kidney disease: Secondary | ICD-10-CM | POA: Diagnosis not present

## 2022-03-26 DIAGNOSIS — I5032 Chronic diastolic (congestive) heart failure: Secondary | ICD-10-CM | POA: Diagnosis not present

## 2022-03-26 DIAGNOSIS — I130 Hypertensive heart and chronic kidney disease with heart failure and stage 1 through stage 4 chronic kidney disease, or unspecified chronic kidney disease: Secondary | ICD-10-CM | POA: Diagnosis not present

## 2022-03-26 DIAGNOSIS — J189 Pneumonia, unspecified organism: Secondary | ICD-10-CM | POA: Diagnosis not present

## 2022-03-26 DIAGNOSIS — E1165 Type 2 diabetes mellitus with hyperglycemia: Secondary | ICD-10-CM | POA: Diagnosis not present

## 2022-03-29 ENCOUNTER — Encounter: Payer: Self-pay | Admitting: Hematology and Oncology

## 2022-03-30 DIAGNOSIS — J189 Pneumonia, unspecified organism: Secondary | ICD-10-CM | POA: Diagnosis not present

## 2022-03-30 DIAGNOSIS — J9601 Acute respiratory failure with hypoxia: Secondary | ICD-10-CM | POA: Diagnosis not present

## 2022-03-30 DIAGNOSIS — E1165 Type 2 diabetes mellitus with hyperglycemia: Secondary | ICD-10-CM | POA: Diagnosis not present

## 2022-03-30 DIAGNOSIS — E1122 Type 2 diabetes mellitus with diabetic chronic kidney disease: Secondary | ICD-10-CM | POA: Diagnosis not present

## 2022-03-30 DIAGNOSIS — I130 Hypertensive heart and chronic kidney disease with heart failure and stage 1 through stage 4 chronic kidney disease, or unspecified chronic kidney disease: Secondary | ICD-10-CM | POA: Diagnosis not present

## 2022-03-30 DIAGNOSIS — I5032 Chronic diastolic (congestive) heart failure: Secondary | ICD-10-CM | POA: Diagnosis not present

## 2022-03-31 DIAGNOSIS — D631 Anemia in chronic kidney disease: Secondary | ICD-10-CM | POA: Diagnosis not present

## 2022-03-31 DIAGNOSIS — M797 Fibromyalgia: Secondary | ICD-10-CM | POA: Diagnosis not present

## 2022-03-31 DIAGNOSIS — K219 Gastro-esophageal reflux disease without esophagitis: Secondary | ICD-10-CM | POA: Diagnosis not present

## 2022-03-31 DIAGNOSIS — D509 Iron deficiency anemia, unspecified: Secondary | ICD-10-CM | POA: Diagnosis not present

## 2022-03-31 DIAGNOSIS — E1165 Type 2 diabetes mellitus with hyperglycemia: Secondary | ICD-10-CM | POA: Diagnosis not present

## 2022-03-31 DIAGNOSIS — J45909 Unspecified asthma, uncomplicated: Secondary | ICD-10-CM | POA: Diagnosis not present

## 2022-03-31 DIAGNOSIS — N1832 Chronic kidney disease, stage 3b: Secondary | ICD-10-CM | POA: Diagnosis not present

## 2022-03-31 DIAGNOSIS — E860 Dehydration: Secondary | ICD-10-CM | POA: Diagnosis not present

## 2022-03-31 DIAGNOSIS — R1314 Dysphagia, pharyngoesophageal phase: Secondary | ICD-10-CM | POA: Diagnosis not present

## 2022-03-31 DIAGNOSIS — I495 Sick sinus syndrome: Secondary | ICD-10-CM | POA: Diagnosis not present

## 2022-03-31 DIAGNOSIS — I429 Cardiomyopathy, unspecified: Secondary | ICD-10-CM | POA: Diagnosis not present

## 2022-03-31 DIAGNOSIS — G43909 Migraine, unspecified, not intractable, without status migrainosus: Secondary | ICD-10-CM | POA: Diagnosis not present

## 2022-03-31 DIAGNOSIS — M1990 Unspecified osteoarthritis, unspecified site: Secondary | ICD-10-CM | POA: Diagnosis not present

## 2022-03-31 DIAGNOSIS — G4733 Obstructive sleep apnea (adult) (pediatric): Secondary | ICD-10-CM | POA: Diagnosis not present

## 2022-03-31 DIAGNOSIS — I5032 Chronic diastolic (congestive) heart failure: Secondary | ICD-10-CM | POA: Diagnosis not present

## 2022-03-31 DIAGNOSIS — D696 Thrombocytopenia, unspecified: Secondary | ICD-10-CM | POA: Diagnosis not present

## 2022-03-31 DIAGNOSIS — E0500 Thyrotoxicosis with diffuse goiter without thyrotoxic crisis or storm: Secondary | ICD-10-CM | POA: Diagnosis not present

## 2022-03-31 DIAGNOSIS — F419 Anxiety disorder, unspecified: Secondary | ICD-10-CM | POA: Diagnosis not present

## 2022-03-31 DIAGNOSIS — I130 Hypertensive heart and chronic kidney disease with heart failure and stage 1 through stage 4 chronic kidney disease, or unspecified chronic kidney disease: Secondary | ICD-10-CM | POA: Diagnosis not present

## 2022-03-31 DIAGNOSIS — E1140 Type 2 diabetes mellitus with diabetic neuropathy, unspecified: Secondary | ICD-10-CM | POA: Diagnosis not present

## 2022-03-31 DIAGNOSIS — Z6841 Body Mass Index (BMI) 40.0 and over, adult: Secondary | ICD-10-CM | POA: Diagnosis not present

## 2022-03-31 DIAGNOSIS — E785 Hyperlipidemia, unspecified: Secondary | ICD-10-CM | POA: Diagnosis not present

## 2022-03-31 DIAGNOSIS — I080 Rheumatic disorders of both mitral and aortic valves: Secondary | ICD-10-CM | POA: Diagnosis not present

## 2022-03-31 DIAGNOSIS — E1122 Type 2 diabetes mellitus with diabetic chronic kidney disease: Secondary | ICD-10-CM | POA: Diagnosis not present

## 2022-04-05 DIAGNOSIS — N1832 Chronic kidney disease, stage 3b: Secondary | ICD-10-CM | POA: Diagnosis not present

## 2022-04-05 DIAGNOSIS — D631 Anemia in chronic kidney disease: Secondary | ICD-10-CM | POA: Diagnosis not present

## 2022-04-05 DIAGNOSIS — E1122 Type 2 diabetes mellitus with diabetic chronic kidney disease: Secondary | ICD-10-CM | POA: Diagnosis not present

## 2022-04-05 DIAGNOSIS — I5032 Chronic diastolic (congestive) heart failure: Secondary | ICD-10-CM | POA: Diagnosis not present

## 2022-04-05 DIAGNOSIS — I130 Hypertensive heart and chronic kidney disease with heart failure and stage 1 through stage 4 chronic kidney disease, or unspecified chronic kidney disease: Secondary | ICD-10-CM | POA: Diagnosis not present

## 2022-04-05 DIAGNOSIS — I429 Cardiomyopathy, unspecified: Secondary | ICD-10-CM | POA: Diagnosis not present

## 2022-04-08 DIAGNOSIS — N1832 Chronic kidney disease, stage 3b: Secondary | ICD-10-CM | POA: Diagnosis not present

## 2022-04-08 DIAGNOSIS — I130 Hypertensive heart and chronic kidney disease with heart failure and stage 1 through stage 4 chronic kidney disease, or unspecified chronic kidney disease: Secondary | ICD-10-CM | POA: Diagnosis not present

## 2022-04-08 DIAGNOSIS — D631 Anemia in chronic kidney disease: Secondary | ICD-10-CM | POA: Diagnosis not present

## 2022-04-08 DIAGNOSIS — I429 Cardiomyopathy, unspecified: Secondary | ICD-10-CM | POA: Diagnosis not present

## 2022-04-08 DIAGNOSIS — I5032 Chronic diastolic (congestive) heart failure: Secondary | ICD-10-CM | POA: Diagnosis not present

## 2022-04-08 DIAGNOSIS — E1122 Type 2 diabetes mellitus with diabetic chronic kidney disease: Secondary | ICD-10-CM | POA: Diagnosis not present

## 2022-04-13 ENCOUNTER — Encounter: Payer: Self-pay | Admitting: Physician Assistant

## 2022-04-13 DIAGNOSIS — E1122 Type 2 diabetes mellitus with diabetic chronic kidney disease: Secondary | ICD-10-CM | POA: Diagnosis not present

## 2022-04-13 DIAGNOSIS — I5032 Chronic diastolic (congestive) heart failure: Secondary | ICD-10-CM | POA: Diagnosis not present

## 2022-04-13 DIAGNOSIS — I130 Hypertensive heart and chronic kidney disease with heart failure and stage 1 through stage 4 chronic kidney disease, or unspecified chronic kidney disease: Secondary | ICD-10-CM | POA: Diagnosis not present

## 2022-04-13 DIAGNOSIS — D631 Anemia in chronic kidney disease: Secondary | ICD-10-CM | POA: Diagnosis not present

## 2022-04-13 DIAGNOSIS — N1832 Chronic kidney disease, stage 3b: Secondary | ICD-10-CM | POA: Diagnosis not present

## 2022-04-13 DIAGNOSIS — I429 Cardiomyopathy, unspecified: Secondary | ICD-10-CM | POA: Diagnosis not present

## 2022-04-15 DIAGNOSIS — D631 Anemia in chronic kidney disease: Secondary | ICD-10-CM | POA: Diagnosis not present

## 2022-04-15 DIAGNOSIS — N1832 Chronic kidney disease, stage 3b: Secondary | ICD-10-CM | POA: Diagnosis not present

## 2022-04-15 DIAGNOSIS — I429 Cardiomyopathy, unspecified: Secondary | ICD-10-CM | POA: Diagnosis not present

## 2022-04-15 DIAGNOSIS — I130 Hypertensive heart and chronic kidney disease with heart failure and stage 1 through stage 4 chronic kidney disease, or unspecified chronic kidney disease: Secondary | ICD-10-CM | POA: Diagnosis not present

## 2022-04-15 DIAGNOSIS — E1122 Type 2 diabetes mellitus with diabetic chronic kidney disease: Secondary | ICD-10-CM | POA: Diagnosis not present

## 2022-04-15 DIAGNOSIS — I5032 Chronic diastolic (congestive) heart failure: Secondary | ICD-10-CM | POA: Diagnosis not present

## 2022-04-16 DIAGNOSIS — N1832 Chronic kidney disease, stage 3b: Secondary | ICD-10-CM | POA: Diagnosis not present

## 2022-04-16 DIAGNOSIS — I5032 Chronic diastolic (congestive) heart failure: Secondary | ICD-10-CM | POA: Diagnosis not present

## 2022-04-16 DIAGNOSIS — E1122 Type 2 diabetes mellitus with diabetic chronic kidney disease: Secondary | ICD-10-CM | POA: Diagnosis not present

## 2022-04-16 DIAGNOSIS — I130 Hypertensive heart and chronic kidney disease with heart failure and stage 1 through stage 4 chronic kidney disease, or unspecified chronic kidney disease: Secondary | ICD-10-CM | POA: Diagnosis not present

## 2022-04-16 DIAGNOSIS — I429 Cardiomyopathy, unspecified: Secondary | ICD-10-CM | POA: Diagnosis not present

## 2022-04-16 DIAGNOSIS — D631 Anemia in chronic kidney disease: Secondary | ICD-10-CM | POA: Diagnosis not present

## 2022-04-19 ENCOUNTER — Encounter: Payer: Self-pay | Admitting: *Deleted

## 2022-04-20 ENCOUNTER — Encounter: Payer: Medicare Other | Admitting: Adult Health

## 2022-04-21 NOTE — Progress Notes (Signed)
This encounter was created in error - please disregard.

## 2022-04-22 ENCOUNTER — Encounter: Payer: Self-pay | Admitting: Physician Assistant

## 2022-04-29 ENCOUNTER — Ambulatory Visit: Payer: Medicare Other | Admitting: Physical Medicine & Rehabilitation
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
## 2022-05-03 ENCOUNTER — Ambulatory Visit: Payer: Medicare Other

## 2022-05-10 ENCOUNTER — Encounter: Payer: Self-pay | Admitting: Physical Therapy

## 2022-05-10 ENCOUNTER — Ambulatory Visit: Payer: Medicare Other | Attending: Internal Medicine | Admitting: Physical Therapy

## 2022-05-10 ENCOUNTER — Other Ambulatory Visit: Payer: Self-pay

## 2022-05-10 DIAGNOSIS — M6281 Muscle weakness (generalized): Secondary | ICD-10-CM | POA: Insufficient documentation

## 2022-05-10 DIAGNOSIS — R2681 Unsteadiness on feet: Secondary | ICD-10-CM | POA: Diagnosis not present

## 2022-05-10 DIAGNOSIS — R2689 Other abnormalities of gait and mobility: Secondary | ICD-10-CM | POA: Insufficient documentation

## 2022-05-10 DIAGNOSIS — G8929 Other chronic pain: Secondary | ICD-10-CM | POA: Diagnosis not present

## 2022-05-10 DIAGNOSIS — M25562 Pain in left knee: Secondary | ICD-10-CM | POA: Insufficient documentation
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
## 2022-05-18 ENCOUNTER — Ambulatory Visit: Payer: Medicare Other | Admitting: Physical Therapy

## 2022-05-18 ENCOUNTER — Encounter: Payer: Self-pay | Admitting: Physical Therapy

## 2022-05-18 DIAGNOSIS — G8929 Other chronic pain: Secondary | ICD-10-CM | POA: Diagnosis not present

## 2022-05-18 DIAGNOSIS — M25562 Pain in left knee: Secondary | ICD-10-CM | POA: Diagnosis not present

## 2022-05-18 DIAGNOSIS — M6281 Muscle weakness (generalized): Secondary | ICD-10-CM

## 2022-05-18 DIAGNOSIS — R2689 Other abnormalities of gait and mobility: Secondary | ICD-10-CM

## 2022-05-18 DIAGNOSIS — R2681 Unsteadiness on feet: Secondary | ICD-10-CM

## 2022-05-19 ENCOUNTER — Inpatient Hospital Stay (HOSPITAL_BASED_OUTPATIENT_CLINIC_OR_DEPARTMENT_OTHER): Payer: Medicare Other | Admitting: Hematology and Oncology

## 2022-05-19 ENCOUNTER — Inpatient Hospital Stay: Payer: Medicare Other | Attending: Hematology and Oncology

## 2022-05-19 ENCOUNTER — Other Ambulatory Visit: Payer: Self-pay | Admitting: Hematology and Oncology

## 2022-05-19 ENCOUNTER — Other Ambulatory Visit: Payer: Self-pay

## 2022-05-19 VITALS — BP 141/64 | HR 64 | Temp 97.6°F | Resp 13 | Wt 213.4 lb

## 2022-05-19 DIAGNOSIS — R161 Splenomegaly, not elsewhere classified: Secondary | ICD-10-CM

## 2022-05-19 DIAGNOSIS — D696 Thrombocytopenia, unspecified: Secondary | ICD-10-CM | POA: Diagnosis not present

## 2022-05-19 DIAGNOSIS — Z794 Long term (current) use of insulin: Secondary | ICD-10-CM | POA: Insufficient documentation

## 2022-05-19 DIAGNOSIS — Z7951 Long term (current) use of inhaled steroids: Secondary | ICD-10-CM | POA: Insufficient documentation

## 2022-05-19 DIAGNOSIS — Z79899 Other long term (current) drug therapy: Secondary | ICD-10-CM | POA: Insufficient documentation

## 2022-05-19 DIAGNOSIS — D509 Iron deficiency anemia, unspecified: Secondary | ICD-10-CM | POA: Diagnosis not present

## 2022-05-19 DIAGNOSIS — D72819 Decreased white blood cell count, unspecified: Secondary | ICD-10-CM | POA: Diagnosis not present

## 2022-05-19 DIAGNOSIS — D61818 Other pancytopenia: Secondary | ICD-10-CM

## 2022-05-19 DIAGNOSIS — D500 Iron deficiency anemia secondary to blood loss (chronic): Secondary | ICD-10-CM

## 2022-05-19 DIAGNOSIS — J45909 Unspecified asthma, uncomplicated: Secondary | ICD-10-CM | POA: Insufficient documentation

## 2022-05-19 DIAGNOSIS — E039 Hypothyroidism, unspecified: Secondary | ICD-10-CM | POA: Diagnosis not present

## 2022-05-19 DIAGNOSIS — I10 Essential (primary) hypertension: Secondary | ICD-10-CM | POA: Insufficient documentation

## 2022-05-19 DIAGNOSIS — E119 Type 2 diabetes mellitus without complications: Secondary | ICD-10-CM | POA: Diagnosis not present

## 2022-05-19 LAB — RETIC PANEL
Immature Retic Fract: 13 % (ref 2.3–15.9)
RBC.: 3.5 MIL/uL — ABNORMAL LOW (ref 3.87–5.11)
Retic Count, Absolute: 77 10*3/uL (ref 19.0–186.0)
Retic Ct Pct: 2.2 % (ref 0.4–3.1)
Reticulocyte Hemoglobin: 30.7 pg (ref >27.9)

## 2022-05-19 LAB — CMP (CANCER CENTER ONLY)
ALT: 10 U/L (ref 0–44)
AST: 14 U/L — ABNORMAL LOW (ref 15–41)
Albumin: 4 g/dL (ref 3.5–5.0)
Alkaline Phosphatase: 66 U/L (ref 38–126)
Anion gap: 6 (ref 5–15)
BUN: 24 mg/dL — ABNORMAL HIGH (ref 8–23)
CO2: 28 mmol/L (ref 22–32)
Calcium: 8.9 mg/dL (ref 8.9–10.3)
Chloride: 103 mmol/L (ref 98–111)
Creatinine: 0.97 mg/dL (ref 0.44–1.00)
GFR, Estimated: >60 mL/min (ref >60)
Glucose, Bld: 212 mg/dL — ABNORMAL HIGH (ref 70–99)
Potassium: 4.6 mmol/L (ref 3.5–5.1)
Sodium: 137 mmol/L (ref 135–145)
Total Bilirubin: 0.4 mg/dL (ref 0.3–1.2)
Total Protein: 6.5 g/dL (ref 6.5–8.1)

## 2022-05-19 LAB — CBC WITH DIFFERENTIAL (CANCER CENTER ONLY)
Abs Immature Granulocytes: 0 10*3/uL (ref 0.00–0.07)
Basophils Absolute: 0 10*3/uL (ref 0.0–0.1)
Basophils Relative: 0 %
Eosinophils Absolute: 0 10*3/uL (ref 0.0–0.5)
Eosinophils Relative: 0 %
HCT: 30 % — ABNORMAL LOW (ref 36.0–46.0)
Hemoglobin: 10 g/dL — ABNORMAL LOW (ref 12.0–15.0)
Immature Granulocytes: 0 %
Lymphocytes Relative: 40 %
Lymphs Abs: 0.6 10*3/uL — ABNORMAL LOW (ref 0.7–4.0)
MCH: 28.5 pg (ref 26.0–34.0)
MCHC: 33.3 g/dL (ref 30.0–36.0)
MCV: 85.5 fL (ref 80.0–100.0)
Monocytes Absolute: 0.1 10*3/uL (ref 0.1–1.0)
Monocytes Relative: 8 %
Neutro Abs: 0.7 10*3/uL — ABNORMAL LOW (ref 1.7–7.7)
Neutrophils Relative %: 52 %
Platelet Count: 108 10*3/uL — ABNORMAL LOW (ref 150–400)
RBC: 3.51 MIL/uL — ABNORMAL LOW (ref 3.87–5.11)
RDW: 13.3 % (ref 11.5–15.5)
WBC Count: 1.4 10*3/uL — ABNORMAL LOW (ref 4.0–10.5)
nRBC: 0 % (ref 0.0–0.2)

## 2022-05-19 LAB — IRON AND IRON BINDING CAPACITY (CC-WL,HP ONLY)
Iron: 63 ug/dL (ref 28–170)
Saturation Ratios: 16 % (ref 10.4–31.8)
TIBC: 396 ug/dL (ref 250–450)
UIBC: 333 ug/dL (ref 148–442)

## 2022-05-19 LAB — FERRITIN: Ferritin: 54 ng/mL (ref 11–307)

## 2022-05-19 NOTE — Progress Notes (Signed)
Unable to reconcile medications. Patient states her medications are per-packaged by [facility]. Encouraged patient to bring a current list of her medications  to next appointment.
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
## 2022-05-23 ENCOUNTER — Encounter: Payer: Self-pay | Admitting: Physician Assistant

## 2022-05-26 ENCOUNTER — Ambulatory Visit: Payer: Medicare Other | Admitting: Physical Therapy

## 2022-05-27 ENCOUNTER — Encounter: Payer: Self-pay | Admitting: Physical Medicine & Rehabilitation

## 2022-05-27 ENCOUNTER — Encounter: Payer: Medicare Other | Attending: Physical Medicine & Rehabilitation | Admitting: Physical Medicine & Rehabilitation

## 2022-05-27 VITALS — BP 147/68 | HR 62 | Temp 98.3°F | Ht 60.0 in | Wt 212.0 lb

## 2022-05-27 DIAGNOSIS — M1712 Unilateral primary osteoarthritis, left knee: Secondary | ICD-10-CM

## 2022-05-27 NOTE — Patient Instructions (Signed)
Please wait for Dr Lorenso Courier office ot contact regarding repeat bloodwork

## 2022-05-27 NOTE — Progress Notes (Signed)
  [facility]   Name: [name] DOB:July 03, 1950 MRN: [id number]  Date:05/27/2022  Physician: Alysia Penna, MD    Nurse/CMA: Lysle Morales RMA   Allergies:  Allergies  Allergen Reactions   Imitrex [Sumatriptan] Other (See Comments)    Vascular spasms   Nsaids Other (See Comments)    PT UNABLE TO TOLERATE NSAID'S DRUGS DUE TO HX OF GASTRIC SLEEVE SURGERY   Statins Other (See Comments)    Leg/muscle pain   Sulfonamide Derivatives Hives    Childhood allergy    Methimazole Hives   Niacin     heart pounding and beating fast.   Codeine Nausea And Vomiting   Promethazine Hcl Other (See Comments)    Restless leg feeling all over body   Vicodin [Hydrocodone-Acetaminophen] Nausea And Vomiting    Consent Signed: Yes.    Is patient diabetic? Yes.    CBG today? 129  Pregnant: No. LMP: No LMP recorded. Patient has had a hysterectomy. (age 33-55)  Anticoagulants: no Anti-inflammatory: no Antibiotics: no  Procedure: Left Knee genicular nerve block - cancelled due to low ANC of 0.7K                                                   D/C home with Husband, patient A & O X 3, D/C instructions reviewed, and sits independently.

## 2022-05-27 NOTE — Progress Notes (Signed)
Cancelled procedure due to low ANC Pt will need to have a repeat CBC and Diff and review result to see if ANC> 1.0K prior to scheduling appt

## 2022-06-14 DIAGNOSIS — E1169 Type 2 diabetes mellitus with other specified complication: Secondary | ICD-10-CM | POA: Diagnosis not present

## 2022-06-14 DIAGNOSIS — R0602 Shortness of breath: Secondary | ICD-10-CM | POA: Diagnosis not present

## 2022-06-14 DIAGNOSIS — Z7409 Other reduced mobility: Secondary | ICD-10-CM | POA: Diagnosis not present

## 2022-06-14 DIAGNOSIS — Z1152 Encounter for screening for COVID-19: Secondary | ICD-10-CM | POA: Diagnosis not present

## 2022-06-14 DIAGNOSIS — Z794 Long term (current) use of insulin: Secondary | ICD-10-CM | POA: Diagnosis not present

## 2022-06-14 DIAGNOSIS — I130 Hypertensive heart and chronic kidney disease with heart failure and stage 1 through stage 4 chronic kidney disease, or unspecified chronic kidney disease: Secondary | ICD-10-CM | POA: Diagnosis not present

## 2022-06-14 DIAGNOSIS — I351 Nonrheumatic aortic (valve) insufficiency: Secondary | ICD-10-CM | POA: Diagnosis not present

## 2022-06-14 DIAGNOSIS — E0500 Thyrotoxicosis with diffuse goiter without thyrotoxic crisis or storm: Secondary | ICD-10-CM | POA: Diagnosis not present

## 2022-06-14 DIAGNOSIS — R7989 Other specified abnormal findings of blood chemistry: Secondary | ICD-10-CM | POA: Diagnosis not present

## 2022-06-14 DIAGNOSIS — F419 Anxiety disorder, unspecified: Secondary | ICD-10-CM | POA: Diagnosis not present

## 2022-06-14 DIAGNOSIS — E669 Obesity, unspecified: Secondary | ICD-10-CM | POA: Diagnosis not present

## 2022-06-14 DIAGNOSIS — I5031 Acute diastolic (congestive) heart failure: Secondary | ICD-10-CM | POA: Diagnosis not present

## 2022-06-14 DIAGNOSIS — E1122 Type 2 diabetes mellitus with diabetic chronic kidney disease: Secondary | ICD-10-CM | POA: Diagnosis not present

## 2022-06-14 DIAGNOSIS — Z9989 Dependence on other enabling machines and devices: Secondary | ICD-10-CM | POA: Diagnosis not present

## 2022-06-14 DIAGNOSIS — J810 Acute pulmonary edema: Secondary | ICD-10-CM | POA: Diagnosis not present

## 2022-06-14 DIAGNOSIS — Z6841 Body Mass Index (BMI) 40.0 and over, adult: Secondary | ICD-10-CM | POA: Diagnosis not present

## 2022-06-14 DIAGNOSIS — D72819 Decreased white blood cell count, unspecified: Secondary | ICD-10-CM | POA: Diagnosis not present

## 2022-06-14 DIAGNOSIS — J811 Chronic pulmonary edema: Secondary | ICD-10-CM | POA: Diagnosis not present

## 2022-06-14 DIAGNOSIS — K219 Gastro-esophageal reflux disease without esophagitis: Secondary | ICD-10-CM | POA: Diagnosis not present

## 2022-06-14 DIAGNOSIS — E785 Hyperlipidemia, unspecified: Secondary | ICD-10-CM | POA: Diagnosis not present

## 2022-06-14 DIAGNOSIS — M797 Fibromyalgia: Secondary | ICD-10-CM | POA: Diagnosis not present

## 2022-06-14 DIAGNOSIS — J9601 Acute respiratory failure with hypoxia: Secondary | ICD-10-CM | POA: Diagnosis not present

## 2022-06-14 DIAGNOSIS — D61818 Other pancytopenia: Secondary | ICD-10-CM | POA: Diagnosis not present

## 2022-06-14 DIAGNOSIS — N182 Chronic kidney disease, stage 2 (mild): Secondary | ICD-10-CM | POA: Diagnosis not present

## 2022-06-14 DIAGNOSIS — D696 Thrombocytopenia, unspecified: Secondary | ICD-10-CM | POA: Diagnosis not present

## 2022-06-14 DIAGNOSIS — R0600 Dyspnea, unspecified: Secondary | ICD-10-CM | POA: Diagnosis not present

## 2022-06-14 DIAGNOSIS — I509 Heart failure, unspecified: Secondary | ICD-10-CM | POA: Diagnosis not present

## 2022-06-23 DIAGNOSIS — Z794 Long term (current) use of insulin: Secondary | ICD-10-CM | POA: Diagnosis not present

## 2022-06-23 DIAGNOSIS — K8689 Other specified diseases of pancreas: Secondary | ICD-10-CM | POA: Diagnosis not present

## 2022-06-23 DIAGNOSIS — J45909 Unspecified asthma, uncomplicated: Secondary | ICD-10-CM | POA: Diagnosis not present

## 2022-06-23 DIAGNOSIS — I5032 Chronic diastolic (congestive) heart failure: Secondary | ICD-10-CM | POA: Diagnosis not present

## 2022-06-23 DIAGNOSIS — J9601 Acute respiratory failure with hypoxia: Secondary | ICD-10-CM | POA: Diagnosis not present

## 2022-06-23 DIAGNOSIS — N1831 Chronic kidney disease, stage 3a: Secondary | ICD-10-CM | POA: Diagnosis not present

## 2022-06-23 DIAGNOSIS — I130 Hypertensive heart and chronic kidney disease with heart failure and stage 1 through stage 4 chronic kidney disease, or unspecified chronic kidney disease: Secondary | ICD-10-CM | POA: Diagnosis not present

## 2022-06-23 DIAGNOSIS — E0590 Thyrotoxicosis, unspecified without thyrotoxic crisis or storm: Secondary | ICD-10-CM | POA: Diagnosis not present

## 2022-06-23 DIAGNOSIS — J189 Pneumonia, unspecified organism: Secondary | ICD-10-CM | POA: Diagnosis not present

## 2022-06-23 DIAGNOSIS — E1129 Type 2 diabetes mellitus with other diabetic kidney complication: Secondary | ICD-10-CM | POA: Diagnosis not present

## 2022-06-23 DIAGNOSIS — E785 Hyperlipidemia, unspecified: Secondary | ICD-10-CM | POA: Diagnosis not present

## 2022-07-06 DIAGNOSIS — E669 Obesity, unspecified: Secondary | ICD-10-CM | POA: Diagnosis present

## 2022-07-06 DIAGNOSIS — L539 Erythematous condition, unspecified: Secondary | ICD-10-CM | POA: Diagnosis not present

## 2022-07-06 DIAGNOSIS — L0390 Cellulitis, unspecified: Secondary | ICD-10-CM | POA: Diagnosis not present

## 2022-07-06 DIAGNOSIS — R9431 Abnormal electrocardiogram [ECG] [EKG]: Secondary | ICD-10-CM | POA: Diagnosis not present

## 2022-07-06 DIAGNOSIS — R6521 Severe sepsis with septic shock: Secondary | ICD-10-CM | POA: Diagnosis present

## 2022-07-06 DIAGNOSIS — R109 Unspecified abdominal pain: Secondary | ICD-10-CM | POA: Diagnosis not present

## 2022-07-06 DIAGNOSIS — R918 Other nonspecific abnormal finding of lung field: Secondary | ICD-10-CM | POA: Diagnosis not present

## 2022-07-06 DIAGNOSIS — F32A Depression, unspecified: Secondary | ICD-10-CM | POA: Diagnosis not present

## 2022-07-06 DIAGNOSIS — Z886 Allergy status to analgesic agent status: Secondary | ICD-10-CM | POA: Diagnosis not present

## 2022-07-06 DIAGNOSIS — I9589 Other hypotension: Secondary | ICD-10-CM | POA: Diagnosis not present

## 2022-07-06 DIAGNOSIS — I130 Hypertensive heart and chronic kidney disease with heart failure and stage 1 through stage 4 chronic kidney disease, or unspecified chronic kidney disease: Secondary | ICD-10-CM | POA: Diagnosis not present

## 2022-07-06 DIAGNOSIS — G4733 Obstructive sleep apnea (adult) (pediatric): Secondary | ICD-10-CM | POA: Diagnosis present

## 2022-07-06 DIAGNOSIS — R5383 Other fatigue: Secondary | ICD-10-CM | POA: Diagnosis not present

## 2022-07-06 DIAGNOSIS — L03311 Cellulitis of abdominal wall: Secondary | ICD-10-CM | POA: Diagnosis present

## 2022-07-06 DIAGNOSIS — E1122 Type 2 diabetes mellitus with diabetic chronic kidney disease: Secondary | ICD-10-CM | POA: Diagnosis present

## 2022-07-06 DIAGNOSIS — Z888 Allergy status to other drugs, medicaments and biological substances status: Secondary | ICD-10-CM | POA: Diagnosis not present

## 2022-07-06 DIAGNOSIS — N178 Other acute kidney failure: Secondary | ICD-10-CM | POA: Diagnosis present

## 2022-07-06 DIAGNOSIS — E0500 Thyrotoxicosis with diffuse goiter without thyrotoxic crisis or storm: Secondary | ICD-10-CM | POA: Diagnosis not present

## 2022-07-06 DIAGNOSIS — J811 Chronic pulmonary edema: Secondary | ICD-10-CM | POA: Diagnosis not present

## 2022-07-06 DIAGNOSIS — D61818 Other pancytopenia: Secondary | ICD-10-CM | POA: Diagnosis present

## 2022-07-06 DIAGNOSIS — R509 Fever, unspecified: Secondary | ICD-10-CM | POA: Diagnosis not present

## 2022-07-06 DIAGNOSIS — Z885 Allergy status to narcotic agent status: Secondary | ICD-10-CM | POA: Diagnosis not present

## 2022-07-06 DIAGNOSIS — D72825 Bandemia: Secondary | ICD-10-CM | POA: Diagnosis not present

## 2022-07-06 DIAGNOSIS — N1831 Chronic kidney disease, stage 3a: Secondary | ICD-10-CM | POA: Diagnosis not present

## 2022-07-06 DIAGNOSIS — N179 Acute kidney failure, unspecified: Secondary | ICD-10-CM | POA: Diagnosis not present

## 2022-07-06 DIAGNOSIS — R162 Hepatomegaly with splenomegaly, not elsewhere classified: Secondary | ICD-10-CM | POA: Diagnosis present

## 2022-07-06 DIAGNOSIS — I5033 Acute on chronic diastolic (congestive) heart failure: Secondary | ICD-10-CM | POA: Diagnosis present

## 2022-07-06 DIAGNOSIS — Z79899 Other long term (current) drug therapy: Secondary | ICD-10-CM | POA: Diagnosis not present

## 2022-07-06 DIAGNOSIS — R0602 Shortness of breath: Secondary | ICD-10-CM | POA: Diagnosis not present

## 2022-07-06 DIAGNOSIS — R0603 Acute respiratory distress: Secondary | ICD-10-CM | POA: Diagnosis not present

## 2022-07-06 DIAGNOSIS — R531 Weakness: Secondary | ICD-10-CM | POA: Diagnosis not present

## 2022-07-06 DIAGNOSIS — Z9884 Bariatric surgery status: Secondary | ICD-10-CM | POA: Diagnosis not present

## 2022-07-06 DIAGNOSIS — Z1152 Encounter for screening for COVID-19: Secondary | ICD-10-CM | POA: Diagnosis not present

## 2022-07-06 DIAGNOSIS — A419 Sepsis, unspecified organism: Secondary | ICD-10-CM | POA: Diagnosis present

## 2022-07-06 DIAGNOSIS — K219 Gastro-esophageal reflux disease without esophagitis: Secondary | ICD-10-CM | POA: Diagnosis not present

## 2022-07-06 DIAGNOSIS — I10 Essential (primary) hypertension: Secondary | ICD-10-CM | POA: Diagnosis not present

## 2022-07-06 DIAGNOSIS — F419 Anxiety disorder, unspecified: Secondary | ICD-10-CM | POA: Diagnosis not present

## 2022-07-06 DIAGNOSIS — Z881 Allergy status to other antibiotic agents status: Secondary | ICD-10-CM | POA: Diagnosis not present

## 2022-07-07 DIAGNOSIS — D61818 Other pancytopenia: Secondary | ICD-10-CM | POA: Diagnosis present

## 2022-07-07 DIAGNOSIS — J811 Chronic pulmonary edema: Secondary | ICD-10-CM | POA: Diagnosis not present

## 2022-07-07 DIAGNOSIS — E669 Obesity, unspecified: Secondary | ICD-10-CM | POA: Diagnosis present

## 2022-07-07 DIAGNOSIS — L03311 Cellulitis of abdominal wall: Secondary | ICD-10-CM | POA: Diagnosis present

## 2022-07-07 DIAGNOSIS — Z9884 Bariatric surgery status: Secondary | ICD-10-CM | POA: Diagnosis not present

## 2022-07-07 DIAGNOSIS — G4733 Obstructive sleep apnea (adult) (pediatric): Secondary | ICD-10-CM | POA: Diagnosis present

## 2022-07-07 DIAGNOSIS — Z1152 Encounter for screening for COVID-19: Secondary | ICD-10-CM | POA: Diagnosis not present

## 2022-07-07 DIAGNOSIS — R918 Other nonspecific abnormal finding of lung field: Secondary | ICD-10-CM | POA: Diagnosis not present

## 2022-07-07 DIAGNOSIS — Z79899 Other long term (current) drug therapy: Secondary | ICD-10-CM | POA: Diagnosis not present

## 2022-07-07 DIAGNOSIS — R0602 Shortness of breath: Secondary | ICD-10-CM | POA: Diagnosis not present

## 2022-07-07 DIAGNOSIS — R109 Unspecified abdominal pain: Secondary | ICD-10-CM | POA: Diagnosis not present

## 2022-07-07 DIAGNOSIS — A419 Sepsis, unspecified organism: Secondary | ICD-10-CM | POA: Diagnosis present

## 2022-07-07 DIAGNOSIS — R162 Hepatomegaly with splenomegaly, not elsewhere classified: Secondary | ICD-10-CM | POA: Diagnosis present

## 2022-07-07 DIAGNOSIS — N179 Acute kidney failure, unspecified: Secondary | ICD-10-CM | POA: Diagnosis not present

## 2022-07-07 DIAGNOSIS — R6521 Severe sepsis with septic shock: Secondary | ICD-10-CM | POA: Diagnosis present

## 2022-07-07 DIAGNOSIS — K219 Gastro-esophageal reflux disease without esophagitis: Secondary | ICD-10-CM | POA: Diagnosis present

## 2022-07-07 DIAGNOSIS — R5383 Other fatigue: Secondary | ICD-10-CM | POA: Diagnosis not present

## 2022-07-07 DIAGNOSIS — R0603 Acute respiratory distress: Secondary | ICD-10-CM | POA: Diagnosis not present

## 2022-07-07 DIAGNOSIS — E0500 Thyrotoxicosis with diffuse goiter without thyrotoxic crisis or storm: Secondary | ICD-10-CM | POA: Diagnosis present

## 2022-07-07 DIAGNOSIS — I10 Essential (primary) hypertension: Secondary | ICD-10-CM | POA: Diagnosis not present

## 2022-07-07 DIAGNOSIS — I130 Hypertensive heart and chronic kidney disease with heart failure and stage 1 through stage 4 chronic kidney disease, or unspecified chronic kidney disease: Secondary | ICD-10-CM | POA: Diagnosis present

## 2022-07-07 DIAGNOSIS — F419 Anxiety disorder, unspecified: Secondary | ICD-10-CM | POA: Diagnosis present

## 2022-07-07 DIAGNOSIS — Z888 Allergy status to other drugs, medicaments and biological substances status: Secondary | ICD-10-CM | POA: Diagnosis not present

## 2022-07-07 DIAGNOSIS — N178 Other acute kidney failure: Secondary | ICD-10-CM | POA: Diagnosis present

## 2022-07-07 DIAGNOSIS — E1122 Type 2 diabetes mellitus with diabetic chronic kidney disease: Secondary | ICD-10-CM | POA: Diagnosis present

## 2022-07-07 DIAGNOSIS — I5033 Acute on chronic diastolic (congestive) heart failure: Secondary | ICD-10-CM | POA: Diagnosis present

## 2022-07-07 DIAGNOSIS — Z886 Allergy status to analgesic agent status: Secondary | ICD-10-CM | POA: Diagnosis not present

## 2022-07-07 DIAGNOSIS — D72825 Bandemia: Secondary | ICD-10-CM | POA: Diagnosis not present

## 2022-07-07 DIAGNOSIS — L0390 Cellulitis, unspecified: Secondary | ICD-10-CM | POA: Diagnosis not present

## 2022-07-07 DIAGNOSIS — R9431 Abnormal electrocardiogram [ECG] [EKG]: Secondary | ICD-10-CM | POA: Diagnosis not present

## 2022-07-07 DIAGNOSIS — R509 Fever, unspecified: Secondary | ICD-10-CM | POA: Diagnosis not present

## 2022-07-07 DIAGNOSIS — F32A Depression, unspecified: Secondary | ICD-10-CM | POA: Diagnosis present

## 2022-07-07 DIAGNOSIS — R531 Weakness: Secondary | ICD-10-CM | POA: Diagnosis not present

## 2022-07-07 DIAGNOSIS — Z881 Allergy status to other antibiotic agents status: Secondary | ICD-10-CM | POA: Diagnosis not present

## 2022-07-07 DIAGNOSIS — I9589 Other hypotension: Secondary | ICD-10-CM | POA: Diagnosis present

## 2022-07-07 DIAGNOSIS — Z885 Allergy status to narcotic agent status: Secondary | ICD-10-CM | POA: Diagnosis not present

## 2022-07-07 DIAGNOSIS — N1831 Chronic kidney disease, stage 3a: Secondary | ICD-10-CM | POA: Diagnosis present

## 2022-07-16 DIAGNOSIS — I959 Hypotension, unspecified: Secondary | ICD-10-CM | POA: Diagnosis not present

## 2022-07-16 DIAGNOSIS — D72825 Bandemia: Secondary | ICD-10-CM | POA: Diagnosis not present

## 2022-07-16 DIAGNOSIS — J9601 Acute respiratory failure with hypoxia: Secondary | ICD-10-CM | POA: Diagnosis not present

## 2022-07-16 DIAGNOSIS — N179 Acute kidney failure, unspecified: Secondary | ICD-10-CM | POA: Diagnosis not present

## 2022-07-16 DIAGNOSIS — F419 Anxiety disorder, unspecified: Secondary | ICD-10-CM | POA: Diagnosis not present

## 2022-07-16 DIAGNOSIS — E1140 Type 2 diabetes mellitus with diabetic neuropathy, unspecified: Secondary | ICD-10-CM | POA: Diagnosis not present

## 2022-07-16 DIAGNOSIS — D72819 Decreased white blood cell count, unspecified: Secondary | ICD-10-CM | POA: Diagnosis not present

## 2022-07-16 DIAGNOSIS — E1122 Type 2 diabetes mellitus with diabetic chronic kidney disease: Secondary | ICD-10-CM | POA: Diagnosis not present

## 2022-07-16 DIAGNOSIS — G4733 Obstructive sleep apnea (adult) (pediatric): Secondary | ICD-10-CM | POA: Diagnosis not present

## 2022-07-16 DIAGNOSIS — E1169 Type 2 diabetes mellitus with other specified complication: Secondary | ICD-10-CM | POA: Diagnosis not present

## 2022-07-16 DIAGNOSIS — I5033 Acute on chronic diastolic (congestive) heart failure: Secondary | ICD-10-CM | POA: Diagnosis not present

## 2022-07-16 DIAGNOSIS — R162 Hepatomegaly with splenomegaly, not elsewhere classified: Secondary | ICD-10-CM | POA: Diagnosis not present

## 2022-07-16 DIAGNOSIS — Z794 Long term (current) use of insulin: Secondary | ICD-10-CM | POA: Diagnosis not present

## 2022-07-16 DIAGNOSIS — F32A Depression, unspecified: Secondary | ICD-10-CM | POA: Diagnosis not present

## 2022-07-16 DIAGNOSIS — A409 Streptococcal sepsis, unspecified: Secondary | ICD-10-CM | POA: Diagnosis not present

## 2022-07-16 DIAGNOSIS — E0500 Thyrotoxicosis with diffuse goiter without thyrotoxic crisis or storm: Secondary | ICD-10-CM | POA: Diagnosis not present

## 2022-07-16 DIAGNOSIS — D696 Thrombocytopenia, unspecified: Secondary | ICD-10-CM | POA: Diagnosis not present

## 2022-07-16 DIAGNOSIS — I130 Hypertensive heart and chronic kidney disease with heart failure and stage 1 through stage 4 chronic kidney disease, or unspecified chronic kidney disease: Secondary | ICD-10-CM | POA: Diagnosis not present

## 2022-07-16 DIAGNOSIS — L03311 Cellulitis of abdominal wall: Secondary | ICD-10-CM | POA: Diagnosis not present

## 2022-07-16 DIAGNOSIS — K219 Gastro-esophageal reflux disease without esophagitis: Secondary | ICD-10-CM | POA: Diagnosis not present

## 2022-07-16 DIAGNOSIS — K7460 Unspecified cirrhosis of liver: Secondary | ICD-10-CM | POA: Diagnosis not present

## 2022-07-16 DIAGNOSIS — N1831 Chronic kidney disease, stage 3a: Secondary | ICD-10-CM | POA: Diagnosis not present

## 2022-07-16 DIAGNOSIS — D61818 Other pancytopenia: Secondary | ICD-10-CM | POA: Diagnosis not present

## 2022-07-16 DIAGNOSIS — E1165 Type 2 diabetes mellitus with hyperglycemia: Secondary | ICD-10-CM | POA: Diagnosis not present

## 2022-07-19 DIAGNOSIS — Z1330 Encounter for screening examination for mental health and behavioral disorders, unspecified: Secondary | ICD-10-CM | POA: Diagnosis not present

## 2022-07-19 DIAGNOSIS — S30821A Blister (nonthermal) of abdominal wall, initial encounter: Secondary | ICD-10-CM | POA: Diagnosis not present

## 2022-07-19 DIAGNOSIS — L0390 Cellulitis, unspecified: Secondary | ICD-10-CM | POA: Diagnosis not present

## 2022-07-19 DIAGNOSIS — E876 Hypokalemia: Secondary | ICD-10-CM | POA: Diagnosis not present

## 2022-07-19 DIAGNOSIS — D61818 Other pancytopenia: Secondary | ICD-10-CM | POA: Diagnosis not present

## 2022-07-19 DIAGNOSIS — L03311 Cellulitis of abdominal wall: Secondary | ICD-10-CM | POA: Diagnosis not present

## 2022-07-19 DIAGNOSIS — N393 Stress incontinence (female) (male): Secondary | ICD-10-CM | POA: Diagnosis not present

## 2022-07-19 DIAGNOSIS — A419 Sepsis, unspecified organism: Secondary | ICD-10-CM | POA: Diagnosis not present

## 2022-07-19 DIAGNOSIS — I5033 Acute on chronic diastolic (congestive) heart failure: Secondary | ICD-10-CM | POA: Diagnosis not present

## 2022-07-19 DIAGNOSIS — L089 Local infection of the skin and subcutaneous tissue, unspecified: Secondary | ICD-10-CM | POA: Diagnosis not present

## 2022-07-19 DIAGNOSIS — E1122 Type 2 diabetes mellitus with diabetic chronic kidney disease: Secondary | ICD-10-CM | POA: Diagnosis not present

## 2022-07-19 DIAGNOSIS — I130 Hypertensive heart and chronic kidney disease with heart failure and stage 1 through stage 4 chronic kidney disease, or unspecified chronic kidney disease: Secondary | ICD-10-CM | POA: Diagnosis not present

## 2022-07-19 DIAGNOSIS — R161 Splenomegaly, not elsewhere classified: Secondary | ICD-10-CM | POA: Diagnosis not present

## 2022-07-19 DIAGNOSIS — I10 Essential (primary) hypertension: Secondary | ICD-10-CM | POA: Diagnosis not present

## 2022-07-19 DIAGNOSIS — A409 Streptococcal sepsis, unspecified: Secondary | ICD-10-CM | POA: Diagnosis not present

## 2022-07-20 DIAGNOSIS — L03311 Cellulitis of abdominal wall: Secondary | ICD-10-CM | POA: Diagnosis not present

## 2022-07-20 DIAGNOSIS — E1122 Type 2 diabetes mellitus with diabetic chronic kidney disease: Secondary | ICD-10-CM | POA: Diagnosis not present

## 2022-07-20 DIAGNOSIS — I130 Hypertensive heart and chronic kidney disease with heart failure and stage 1 through stage 4 chronic kidney disease, or unspecified chronic kidney disease: Secondary | ICD-10-CM | POA: Diagnosis not present

## 2022-07-20 DIAGNOSIS — D61818 Other pancytopenia: Secondary | ICD-10-CM | POA: Diagnosis not present

## 2022-07-20 DIAGNOSIS — A409 Streptococcal sepsis, unspecified: Secondary | ICD-10-CM | POA: Diagnosis not present

## 2022-07-20 DIAGNOSIS — I5033 Acute on chronic diastolic (congestive) heart failure: Secondary | ICD-10-CM | POA: Diagnosis not present

## 2022-07-21 ENCOUNTER — Emergency Department (HOSPITAL_COMMUNITY): Payer: Medicare Other

## 2022-07-21 ENCOUNTER — Inpatient Hospital Stay (HOSPITAL_COMMUNITY)
Admission: EM | Admit: 2022-07-21 | Discharge: 2022-07-30 | DRG: 857 | Disposition: A | Discharge status: Home Health | Payer: Medicare Other | Attending: Internal Medicine | Admitting: Internal Medicine

## 2022-07-21 ENCOUNTER — Encounter (HOSPITAL_COMMUNITY): Payer: Self-pay

## 2022-07-21 ENCOUNTER — Encounter: Payer: Self-pay | Admitting: Physician Assistant

## 2022-07-21 ENCOUNTER — Other Ambulatory Visit: Payer: Self-pay

## 2022-07-21 DIAGNOSIS — T8141XA Infection following a procedure, superficial incisional surgical site, initial encounter: Principal | ICD-10-CM | POA: Diagnosis present

## 2022-07-21 DIAGNOSIS — E785 Hyperlipidemia, unspecified: Secondary | ICD-10-CM | POA: Diagnosis present

## 2022-07-21 DIAGNOSIS — I5032 Chronic diastolic (congestive) heart failure: Secondary | ICD-10-CM | POA: Diagnosis not present

## 2022-07-21 DIAGNOSIS — E1165 Type 2 diabetes mellitus with hyperglycemia: Secondary | ICD-10-CM | POA: Diagnosis present

## 2022-07-21 DIAGNOSIS — E1152 Type 2 diabetes mellitus with diabetic peripheral angiopathy with gangrene: Secondary | ICD-10-CM | POA: Diagnosis present

## 2022-07-21 DIAGNOSIS — E1142 Type 2 diabetes mellitus with diabetic polyneuropathy: Secondary | ICD-10-CM | POA: Diagnosis not present

## 2022-07-21 DIAGNOSIS — E119 Type 2 diabetes mellitus without complications: Secondary | ICD-10-CM | POA: Diagnosis not present

## 2022-07-21 DIAGNOSIS — S31104A Unspecified open wound of abdominal wall, left lower quadrant without penetration into peritoneal cavity, initial encounter: Secondary | ICD-10-CM | POA: Diagnosis not present

## 2022-07-21 DIAGNOSIS — D61818 Other pancytopenia: Secondary | ICD-10-CM | POA: Diagnosis not present

## 2022-07-21 DIAGNOSIS — E7800 Pure hypercholesterolemia, unspecified: Secondary | ICD-10-CM | POA: Diagnosis present

## 2022-07-21 DIAGNOSIS — L03311 Cellulitis of abdominal wall: Secondary | ICD-10-CM | POA: Diagnosis not present

## 2022-07-21 DIAGNOSIS — E0500 Thyrotoxicosis with diffuse goiter without thyrotoxic crisis or storm: Secondary | ICD-10-CM | POA: Diagnosis present

## 2022-07-21 DIAGNOSIS — Z8261 Family history of arthritis: Secondary | ICD-10-CM

## 2022-07-21 DIAGNOSIS — Z833 Family history of diabetes mellitus: Secondary | ICD-10-CM | POA: Diagnosis not present

## 2022-07-21 DIAGNOSIS — F32A Depression, unspecified: Secondary | ICD-10-CM | POA: Diagnosis present

## 2022-07-21 DIAGNOSIS — I96 Gangrene, not elsewhere classified: Secondary | ICD-10-CM | POA: Diagnosis not present

## 2022-07-21 DIAGNOSIS — D649 Anemia, unspecified: Secondary | ICD-10-CM

## 2022-07-21 DIAGNOSIS — N19 Unspecified kidney failure: Secondary | ICD-10-CM

## 2022-07-21 DIAGNOSIS — Z809 Family history of malignant neoplasm, unspecified: Secondary | ICD-10-CM | POA: Diagnosis not present

## 2022-07-21 DIAGNOSIS — Z96651 Presence of right artificial knee joint: Secondary | ICD-10-CM | POA: Diagnosis present

## 2022-07-21 DIAGNOSIS — I10 Essential (primary) hypertension: Secondary | ICD-10-CM | POA: Diagnosis present

## 2022-07-21 DIAGNOSIS — Z794 Long term (current) use of insulin: Secondary | ICD-10-CM

## 2022-07-21 DIAGNOSIS — N1831 Chronic kidney disease, stage 3a: Secondary | ICD-10-CM | POA: Diagnosis present

## 2022-07-21 DIAGNOSIS — G4733 Obstructive sleep apnea (adult) (pediatric): Secondary | ICD-10-CM | POA: Diagnosis present

## 2022-07-21 DIAGNOSIS — Z6841 Body Mass Index (BMI) 40.0 and over, adult: Secondary | ICD-10-CM | POA: Diagnosis not present

## 2022-07-21 DIAGNOSIS — I700 Atherosclerosis of aorta: Secondary | ICD-10-CM | POA: Diagnosis not present

## 2022-07-21 DIAGNOSIS — N179 Acute kidney failure, unspecified: Secondary | ICD-10-CM | POA: Diagnosis not present

## 2022-07-21 DIAGNOSIS — L02211 Cutaneous abscess of abdominal wall: Secondary | ICD-10-CM | POA: Diagnosis not present

## 2022-07-21 DIAGNOSIS — Z79899 Other long term (current) drug therapy: Secondary | ICD-10-CM

## 2022-07-21 DIAGNOSIS — D62 Acute posthemorrhagic anemia: Secondary | ICD-10-CM | POA: Diagnosis present

## 2022-07-21 DIAGNOSIS — I110 Hypertensive heart disease with heart failure: Secondary | ICD-10-CM | POA: Diagnosis not present

## 2022-07-21 DIAGNOSIS — Z8249 Family history of ischemic heart disease and other diseases of the circulatory system: Secondary | ICD-10-CM

## 2022-07-21 DIAGNOSIS — Z885 Allergy status to narcotic agent status: Secondary | ICD-10-CM

## 2022-07-21 DIAGNOSIS — M7989 Other specified soft tissue disorders: Secondary | ICD-10-CM | POA: Diagnosis not present

## 2022-07-21 DIAGNOSIS — D509 Iron deficiency anemia, unspecified: Secondary | ICD-10-CM | POA: Diagnosis present

## 2022-07-21 DIAGNOSIS — E1122 Type 2 diabetes mellitus with diabetic chronic kidney disease: Secondary | ICD-10-CM | POA: Diagnosis not present

## 2022-07-21 DIAGNOSIS — M726 Necrotizing fasciitis: Secondary | ICD-10-CM | POA: Diagnosis not present

## 2022-07-21 DIAGNOSIS — Z9884 Bariatric surgery status: Secondary | ICD-10-CM

## 2022-07-21 DIAGNOSIS — S31109A Unspecified open wound of abdominal wall, unspecified quadrant without penetration into peritoneal cavity, initial encounter: Secondary | ICD-10-CM | POA: Diagnosis not present

## 2022-07-21 DIAGNOSIS — I517 Cardiomegaly: Secondary | ICD-10-CM | POA: Diagnosis not present

## 2022-07-21 DIAGNOSIS — E1140 Type 2 diabetes mellitus with diabetic neuropathy, unspecified: Secondary | ICD-10-CM | POA: Diagnosis present

## 2022-07-21 DIAGNOSIS — Z882 Allergy status to sulfonamides status: Secondary | ICD-10-CM

## 2022-07-21 DIAGNOSIS — F418 Other specified anxiety disorders: Secondary | ICD-10-CM | POA: Diagnosis not present

## 2022-07-21 DIAGNOSIS — Y838 Other surgical procedures as the cause of abnormal reaction of the patient, or of later complication, without mention of misadventure at the time of the procedure: Secondary | ICD-10-CM | POA: Diagnosis present

## 2022-07-21 DIAGNOSIS — E782 Mixed hyperlipidemia: Secondary | ICD-10-CM | POA: Diagnosis not present

## 2022-07-21 DIAGNOSIS — I5033 Acute on chronic diastolic (congestive) heart failure: Secondary | ICD-10-CM | POA: Diagnosis not present

## 2022-07-21 DIAGNOSIS — E039 Hypothyroidism, unspecified: Secondary | ICD-10-CM | POA: Diagnosis present

## 2022-07-21 DIAGNOSIS — Z96611 Presence of right artificial shoulder joint: Secondary | ICD-10-CM | POA: Diagnosis present

## 2022-07-21 DIAGNOSIS — Z981 Arthrodesis status: Secondary | ICD-10-CM

## 2022-07-21 DIAGNOSIS — L929 Granulomatous disorder of the skin and subcutaneous tissue, unspecified: Secondary | ICD-10-CM | POA: Diagnosis not present

## 2022-07-21 DIAGNOSIS — A409 Streptococcal sepsis, unspecified: Secondary | ICD-10-CM | POA: Diagnosis not present

## 2022-07-21 DIAGNOSIS — I130 Hypertensive heart and chronic kidney disease with heart failure and stage 1 through stage 4 chronic kidney disease, or unspecified chronic kidney disease: Secondary | ICD-10-CM | POA: Diagnosis present

## 2022-07-21 DIAGNOSIS — K862 Cyst of pancreas: Secondary | ICD-10-CM | POA: Diagnosis present

## 2022-07-21 DIAGNOSIS — Z8052 Family history of malignant neoplasm of bladder: Secondary | ICD-10-CM

## 2022-07-21 DIAGNOSIS — G2581 Restless legs syndrome: Secondary | ICD-10-CM | POA: Diagnosis present

## 2022-07-21 DIAGNOSIS — Z7951 Long term (current) use of inhaled steroids: Secondary | ICD-10-CM

## 2022-07-21 DIAGNOSIS — D638 Anemia in other chronic diseases classified elsewhere: Secondary | ICD-10-CM | POA: Diagnosis not present

## 2022-07-21 DIAGNOSIS — Z886 Allergy status to analgesic agent status: Secondary | ICD-10-CM

## 2022-07-21 DIAGNOSIS — I509 Heart failure, unspecified: Secondary | ICD-10-CM | POA: Diagnosis not present

## 2022-07-21 DIAGNOSIS — Z888 Allergy status to other drugs, medicaments and biological substances status: Secondary | ICD-10-CM

## 2022-07-21 DIAGNOSIS — J4540 Moderate persistent asthma, uncomplicated: Secondary | ICD-10-CM | POA: Diagnosis not present

## 2022-07-21 DIAGNOSIS — E538 Deficiency of other specified B group vitamins: Secondary | ICD-10-CM | POA: Diagnosis present

## 2022-07-21 DIAGNOSIS — M797 Fibromyalgia: Secondary | ICD-10-CM | POA: Diagnosis present

## 2022-07-21 DIAGNOSIS — Z823 Family history of stroke: Secondary | ICD-10-CM

## 2022-07-21 LAB — I-STAT CHEM 8, ED
BUN: 77 mg/dL — ABNORMAL HIGH (ref 8–23)
Calcium, Ion: 1.18 mmol/L (ref 1.15–1.40)
Chloride: 99 mmol/L (ref 98–111)
Creatinine, Ser: 1.6 mg/dL — ABNORMAL HIGH (ref 0.44–1.00)
Glucose, Bld: 229 mg/dL — ABNORMAL HIGH (ref 70–99)
HCT: 21 % — ABNORMAL LOW (ref 36.0–46.0)
Hemoglobin: 7.1 g/dL — ABNORMAL LOW (ref 12.0–15.0)
Potassium: 4.7 mmol/L (ref 3.5–5.1)
Sodium: 137 mmol/L (ref 135–145)
TCO2: 28 mmol/L (ref 22–32)

## 2022-07-21 LAB — COMPREHENSIVE METABOLIC PANEL
ALT: 9 U/L (ref 0–44)
AST: 11 U/L — ABNORMAL LOW (ref 15–41)
Albumin: 3 g/dL — ABNORMAL LOW (ref 3.5–5.0)
Alkaline Phosphatase: 49 U/L (ref 38–126)
Anion gap: 10 (ref 5–15)
BUN: 71 mg/dL — ABNORMAL HIGH (ref 8–23)
CO2: 26 mmol/L (ref 22–32)
Calcium: 8.6 mg/dL — ABNORMAL LOW (ref 8.9–10.3)
Chloride: 98 mmol/L (ref 98–111)
Creatinine, Ser: 1.53 mg/dL — ABNORMAL HIGH (ref 0.44–1.00)
GFR, Estimated: 36 mL/min — ABNORMAL LOW (ref >60)
Glucose, Bld: 226 mg/dL — ABNORMAL HIGH (ref 70–99)
Potassium: 4.6 mmol/L (ref 3.5–5.1)
Sodium: 134 mmol/L — ABNORMAL LOW (ref 135–145)
Total Bilirubin: 0.5 mg/dL (ref 0.3–1.2)
Total Protein: 7.1 g/dL (ref 6.5–8.1)

## 2022-07-21 LAB — CBC WITH DIFFERENTIAL/PLATELET
Abs Immature Granulocytes: 0 10*3/uL (ref 0.00–0.07)
Basophils Absolute: 0 10*3/uL (ref 0.0–0.1)
Basophils Relative: 0 %
Eosinophils Absolute: 0 10*3/uL (ref 0.0–0.5)
Eosinophils Relative: 0 %
HCT: 22.5 % — ABNORMAL LOW (ref 36.0–46.0)
Hemoglobin: 7.2 g/dL — ABNORMAL LOW (ref 12.0–15.0)
Immature Granulocytes: 0 %
Lymphocytes Relative: 24 %
Lymphs Abs: 0.5 10*3/uL — ABNORMAL LOW (ref 0.7–4.0)
MCH: 27.3 pg (ref 26.0–34.0)
MCHC: 32 g/dL (ref 30.0–36.0)
MCV: 85.2 fL (ref 80.0–100.0)
Monocytes Absolute: 0.2 10*3/uL (ref 0.1–1.0)
Monocytes Relative: 8 %
Neutro Abs: 1.3 10*3/uL — ABNORMAL LOW (ref 1.7–7.7)
Neutrophils Relative %: 68 %
Platelets: 115 10*3/uL — ABNORMAL LOW (ref 150–400)
RBC: 2.64 MIL/uL — ABNORMAL LOW (ref 3.87–5.11)
RDW: 13.9 % (ref 11.5–15.5)
WBC: 1.9 10*3/uL — ABNORMAL LOW (ref 4.0–10.5)
nRBC: 0 % (ref 0.0–0.2)

## 2022-07-21 LAB — URINALYSIS, W/ REFLEX TO CULTURE (INFECTION SUSPECTED)
Bacteria, UA: NONE SEEN
Bilirubin Urine: NEGATIVE
Glucose, UA: 50 mg/dL — AB
Hgb urine dipstick: NEGATIVE
Ketones, ur: NEGATIVE mg/dL
Nitrite: NEGATIVE
Protein, ur: NEGATIVE mg/dL
Specific Gravity, Urine: 1.012 (ref 1.005–1.030)
pH: 6 (ref 5.0–8.0)

## 2022-07-21 LAB — PROTIME-INR
INR: 1.2 (ref 0.8–1.2)
Prothrombin Time: 14.9 seconds (ref 11.4–15.2)

## 2022-07-21 LAB — LACTIC ACID, PLASMA: Lactic Acid, Venous: 1.8 mmol/L (ref 0.5–1.9)

## 2022-07-21 LAB — APTT: aPTT: 63 seconds — ABNORMAL HIGH (ref 24–36)

## 2022-07-21 MED ORDER — VANCOMYCIN HCL IN DEXTROSE 1-5 GM/200ML-% IV SOLN: 1000.0000 mg | INTRAVENOUS | Status: DC

## 2022-07-21 MED ORDER — IOHEXOL 300 MG/ML  SOLN
80.0000 mL | Freq: Once | INTRAMUSCULAR | Status: AC | PRN
  Administered 2022-07-21: 75 mL via INTRAVENOUS

## 2022-07-21 MED ORDER — VANCOMYCIN HCL IN DEXTROSE 1-5 GM/200ML-% IV SOLN
1000.0000 mg | Freq: Once | INTRAVENOUS | Status: AC
  Administered 2022-07-21: 1000 mg via INTRAVENOUS
  Filled 2022-07-21: qty 200

## 2022-07-21 MED ORDER — MORPHINE SULFATE (PF) 4 MG/ML IV SOLN
4.0000 mg | Freq: Once | INTRAVENOUS | Status: AC
  Administered 2022-07-21: 4 mg via INTRAVENOUS
  Filled 2022-07-21: qty 1

## 2022-07-21 MED ORDER — ONDANSETRON HCL 4 MG/2ML IJ SOLN
4.0000 mg | Freq: Once | INTRAMUSCULAR | Status: AC
  Administered 2022-07-21: 4 mg via INTRAVENOUS
  Filled 2022-07-21: qty 2

## 2022-07-21 MED ORDER — SODIUM CHLORIDE 0.9 % IV BOLUS
500.0000 mL | Freq: Once | INTRAVENOUS | Status: AC
  Administered 2022-07-21: 500 mL via INTRAVENOUS

## 2022-07-21 NOTE — ED Triage Notes (Signed)
Pt arrives POV c/o wound infection to abdomen. Pt states that this wound started with a small ulcer around Christmas and has progressively gotten larger. Pt was recently admitted to hospital in [facility] for 8 days after being diagnosed with sepsis and septic shock. Discharged on 07/14/22.  Pt went to wound clinic today and was told to come to the hospital due to wound needing debridement and possible wound vac.Pt states that moving or changing dressing causes her pain. Pt states that she was also told that her platelets and WBC counts were low about a month ago.

## 2022-07-21 NOTE — Progress Notes (Signed)
Pharmacy Antibiotic Note  [name] is a 72 y.o. female admitted on 07/21/2022 with wound infection to the abdomen.  Pharmacy has been consulted for vancomycin dosing for cellulitis.  1st dose given in the ED  Plan: Vancomycin 1gm IV q48h (AUC 445.5, Scr 1.6) Follow renal function and clinical course  Height: 5' (152.4 cm) Weight: 93.9 kg (207 lb) IBW/kg (Calculated) : 45.5  Temp (24hrs), Avg:97.8 F (36.6 C), Min:97.8 F (36.6 C), Max:97.8 F (36.6 C)  Recent Labs  Lab 07/21/22 2200 07/21/22 2216  WBC 1.9*  --   CREATININE 1.53* 1.60*  LATICACIDVEN 1.8  --     Estimated Creatinine Clearance: 33.5 mL/min (A) (by C-G formula based on SCr of 1.6 mg/dL (H)).    Allergies  Allergen Reactions   Sumatriptan Other (See Comments) and Anaphylaxis    Vascular spasms  Other Reaction(s): Other  Facial spasms, Vasospasms   Codeine Nausea And Vomiting    Other Reaction(s): GI Intolerance   Nsaids Other (See Comments)    PT UNABLE TO TOLERATE NSAID'S DRUGS DUE TO HX OF GASTRIC SLEEVE SURGERY   Statins Other (See Comments)    Leg/muscle pain   Sulfonamide Derivatives Hives    Childhood allergy    Hydrocodone-Acetaminophen Nausea And Vomiting    Other Reaction(s): GI Intolerance   Methimazole Hives   Niacin     heart pounding and beating fast.   Promethazine Hcl Other (See Comments)    Restless leg feeling all over body   Vicodin [Hydrocodone-Acetaminophen] Nausea And Vomiting    Antimicrobials this admission: 5/1 vanc >>  Dose adjustments this admission:   Microbiology results: 5/1 BCx:   Thank you for allowing pharmacy to be a part of this patient's care.  Arley Phenix RPh 07/21/2022, 11:29 PM

## 2022-07-21 NOTE — ED Provider Notes (Signed)
[name] AT [facility] Provider Note   CSN: [id number] Arrival date & time: 07/21/22  2035     History  Chief Complaint  Patient presents with   Wound Infection    [name] is a 72 y.o. female.  Patient is a 72 year old female with a past medical history of diabetes, CHF, pancytopenia and recent admission for cellulitis to her abdominal wall presenting to the emergency [name] with concern for worsening wound infection.  Patient states that the wound first developed about 8 days ago and she had to be admitted to the hospital for septic shock.  She states that she was discharged home on linezolid that she finished 2 to 3 days ago.  She states that she had follow-up in the wound care clinic today who is concerned that her wound was looking worse and that she should come back to the ER as she may need further antibiotics, debridement or wound VAC placement.  She states that she has not had any fevers since she has been home and does not believe that the wound has been getting any bigger or worse.  The history is provided by the patient and the spouse.       Home Medications Prior to Admission medications   Medication Sig Start Date End Date Taking? Authorizing Provider  CREON 36000-114000 units CPEP capsule Take 72,000 Units by mouth 3 (three) times daily with meals. 05/11/22  Yes [provider]  furosemide (LASIX) 40 MG tablet Take 40 mg by mouth daily. 06/17/22  Yes [provider]  Insulin Glargine (BASAGLAR KWIKPEN) 100 UNIT/ML Inject 20 Units into the skin 2 (two) times daily. 05/11/22  Yes [provider]  Lactobacillus Acid-Pectin (ACIDOPHILUS/PECTIN) CAPS Take 1 capsule by mouth in the morning, at noon, and at bedtime. 07/14/22 07/29/22 Yes [provider]  linezolid (ZYVOX) 600 MG tablet Take 600 mg by mouth 2 (two) times daily. 07/14/22 07/22/22 Yes [provider]  magnesium oxide (MAG-OX) 400 (240  Mg) MG tablet Take 1 tablet by mouth daily. 07/14/22  Yes [provider]  oxyCODONE-acetaminophen (PERCOCET/ROXICET) 5-325 MG tablet Take by mouth. 07/06/22  Yes [provider]  acetaminophen (TYLENOL) 650 MG CR tablet Take 1,300 mg by mouth every 8 (eight) hours as needed for pain.    [provider]  amLODipine (NORVASC) 5 MG tablet Take 5 mg by mouth at bedtime.    [provider]  BD INSULIN SYRINGE U/F 31G X 5/16" 0.5 ML MISC USE 5 TIMES A DAY LEVEMIR AND NOVOLOG 12/13/18   [provider]  BD PEN NEEDLE MICRO U/F 32G X 6 MM MISC Inject 32 pens into the skin daily. 06/18/19   [provider]  benzonatate (TESSALON) 200 MG capsule Take 200 mg by mouth 3 (three) times daily as needed for cough. 02/18/22   [provider]  Cholecalciferol (DIALYVITE VITAMIN D 5000) 125 MCG (5000 UT) capsule Take 5,000 Units by mouth daily.    [provider]  clonazePAM (KLONOPIN) 0.5 MG tablet Take 0.5 mg by mouth See admin instructions. Take 0.5 mg at bedtime, may take an additional 0.5 mg tablet twice daily as needed for anxiety 07/01/14   [provider]  diclofenac (VOLTAREN) 75 MG EC tablet Take 75 mg by mouth 2 (two) times daily.    [provider]  doxazosin (CARDURA) 2 MG tablet Take 2 mg by mouth daily. 12/04/18   [provider]  fenofibrate 54 MG  tablet Take 54 mg by mouth daily with supper. 06/20/17   [provider]  FLUoxetine (PROZAC) 20 MG capsule Take 60 mg by mouth at bedtime.  06/28/17   [provider]  fluticasone (FLONASE) 50 MCG/ACT nasal spray Place 2 sprays into both nostrils daily as needed for allergies or rhinitis.    [provider]  FREESTYLE TEST STRIPS test strip USE STRIPS TO TEST BLOOD GLUCOSE BID 10/03/18   [provider]  gabapentin (NEURONTIN) 300 MG capsule Take 1 capsule (300 mg total) by mouth 3 (three) times daily for 30 days. 07/31/18 03/10/22  Jerald Kief, MD  hyoscyamine (LEVSIN SL) 0.125 MG SL tablet Place 1 tablet (0.125 mg total) under the tongue every 6 (six) hours as needed. Patient taking differently: Place 0.125 mg under the tongue every 6 (six) hours as needed for cramping. 09/17/21   Lemmon, Violet Baldy, PA  insulin aspart (NOVOLOG) 100 UNIT/ML FlexPen Sliding scale dose: CBG < 70: implement hypoglycemia protocol  CBG 70 - 120: 0 units  CBG 121 - 150: 0 units  CBG 151 - 200: 0 units  CBG 201 - 250: 2 units  CBG 251 - 300: 3 units  CBG 301 - 350: 4 units  CBG 351 - 400: 5 units Patient taking differently: Inject 20-25 Units into the skin 2 (two) times daily as needed for high blood sugar. 07/31/18   Jerald Kief, MD  insulin detemir (LEVEMIR) 100 UNIT/ML injection Inject 25 Units into the skin 2 (two) times daily.    [provider]  Lancets Misc. (UNISTIK 3 COMFORT) MISC USE LANCETS TO CHECK BLOOD GLUCOSE BID 11/30/18   [provider]  metoprolol tartrate (LOPRESSOR) 50 MG tablet Take 50 mg by mouth 3 (three) times daily. 12/04/18   [provider]  omeprazole (PRILOSEC) 20 MG capsule Take 20 mg by mouth daily with supper.     [provider]  ondansetron (ZOFRAN) 4 MG tablet Take 1 tablet (4 mg total) by mouth every 8 (eight) hours as needed for nausea or vomiting. 03/13/22   Calvert Cantor, MD  oxybutynin (DITROPAN-XL) 10 MG 24 hr tablet Take 10 mg by mouth daily. 06/18/19   [provider]  propylthiouracil (PTU) 50 MG tablet Take 50 mg by mouth daily. 12/22/16   [provider]  QUEtiapine (SEROQUEL) 50 MG tablet Take 50 mg by mouth at bedtime. 06/18/19   [provider]  rOPINIRole (REQUIP) 2 MG tablet Take 2 mg by mouth See admin instructions. Take 2 mg at bedtime, may take a second 2 mg dose as needed for restless legs    [provider]  SYMBICORT 160-4.5 MCG/ACT inhaler Inhale 2 puffs into the lungs 2 (two) times daily. 11/11/21   [provider]  tiZANidine (ZANAFLEX) 4 MG tablet Take 4 mg by mouth 2 (two) times daily as needed for muscle spasms. 06/18/19   [provider]  VENTOLIN HFA 108 (90 Base) MCG/ACT inhaler Inhale 2 puffs into the lungs every 4 (four) hours as needed for shortness of breath. 11/11/21   [provider]      Allergies    Sumatriptan, Codeine, Nsaids, Statins, Sulfonamide derivatives, Hydrocodone-acetaminophen, Methimazole, Niacin, Promethazine hcl, and Vicodin [hydrocodone-acetaminophen]    Review of Systems   Review of Systems  Physical Exam Updated Vital Signs BP (!) 154/49   Pulse 75   Temp 97.8 F (36.6 C) (Oral)   Resp 18   Ht 5' (1.524 m)  Wt 93.9 kg   SpO2 95%   BMI 40.43 kg/m  Physical Exam Vitals and nursing note reviewed.  Constitutional:      General: She is not in acute distress.    Appearance: Normal appearance.  HENT:     Head: Normocephalic and atraumatic.     Nose: Nose normal.     Mouth/Throat:     Mouth: Mucous membranes are moist.     Pharynx: Oropharynx is clear.  Eyes:     Extraocular Movements: Extraocular movements intact.     Conjunctiva/sclera: Conjunctivae normal.  Cardiovascular:     Rate and Rhythm: Normal rate and regular rhythm.     Heart sounds: Normal heart sounds.  Pulmonary:     Effort: Pulmonary effort is normal.     Breath sounds: Normal breath sounds.  Abdominal:     General: Abdomen is flat.     Palpations: Abdomen is soft.  Musculoskeletal:        General: Normal range of motion.     Cervical back: Normal range of motion and neck supple.  Skin:    General: Skin is warm and dry.     Comments: Large ~25 cm x 12 cm necrotic abdominal wall wound with surrounding erythema and warmth, no purulent drainage, no crepitus  Neurological:     General: No focal deficit present.     Mental Status: She is alert and oriented to person, place, and time.  Psychiatric:        Mood and Affect: Mood normal.        Behavior:  Behavior normal.     ED Results / Procedures / Treatments   Labs (all labs ordered are listed, but only abnormal results are displayed) Labs Reviewed  COMPREHENSIVE METABOLIC PANEL - Abnormal; Notable for the following components:      Result Value   Sodium 134 (*)    Glucose, Bld 226 (*)    BUN 71 (*)    Creatinine, Ser 1.53 (*)    Calcium 8.6 (*)    Albumin 3.0 (*)    AST 11 (*)    GFR, Estimated 36 (*)    All other components within normal limits  CBC WITH DIFFERENTIAL/PLATELET - Abnormal; Notable for the following components:   WBC 1.9 (*)    RBC 2.64 (*)    Hemoglobin 7.2 (*)    HCT 22.5 (*)    Platelets 115 (*)    Neutro Abs 1.3 (*)    Lymphs Abs 0.5 (*)    All other components within normal limits  APTT - Abnormal; Notable for the following components:   aPTT 63 (*)    All other components within normal limits  URINALYSIS, W/ REFLEX TO CULTURE (INFECTION SUSPECTED) - Abnormal; Notable for the following components:   Color, Urine STRAW (*)    Glucose, UA 50 (*)    Leukocytes,Ua TRACE (*)    All other components within normal limits  I-STAT CHEM 8, ED - Abnormal; Notable for the following components:   BUN 77 (*)    Creatinine, Ser 1.60 (*)    Glucose, Bld 229 (*)    Hemoglobin 7.1 (*)    HCT 21.0 (*)    All other components within normal limits  CULTURE, BLOOD (ROUTINE X 2)  CULTURE, BLOOD (ROUTINE X 2)  LACTIC ACID, PLASMA  PROTIME-INR  LACTIC ACID, PLASMA    EKG EKG Interpretation  Date/Time:  Wednesday Jul 21 2022 22:30:12 EDT Ventricular Rate:  69 PR Interval:  162 QRS Duration: 91 QT Interval:  425 QTC Calculation: 456 R Axis:   73 Text Interpretation: Sinus rhythm No significant change since last tracing Confirmed by Elayne Snare ([id number]) on 07/21/2022 11:26:23 PM  Radiology CT ABDOMEN PELVIS W CONTRAST  Result Date: 07/21/2022 CLINICAL DATA:  Abdominal wall wound. Evaluate for deep space infection. EXAM: CT ABDOMEN AND PELVIS WITH CONTRAST  TECHNIQUE: Multidetector CT imaging of the abdomen and pelvis was performed using the standard protocol following bolus administration of intravenous contrast. RADIATION DOSE REDUCTION: This exam was performed according to the departmental dose-optimization program which includes automated exposure control, adjustment of the mA and/or kV according to patient size and/or use of iterative reconstruction technique. CONTRAST:  75mL OMNIPAQUE IOHEXOL 300 MG/ML  SOLN COMPARISON:  CT abdomen and pelvis 10/06/2021 FINDINGS: Lower chest: No acute abnormality. Hepatobiliary: The liver is enlarged, unchanged. The gallbladder is surgically absent. There is no biliary ductal dilatation. Pancreas: Rounded 14 mm low-attenuation lesion in the head of the pancreas has slightly decreased in size. Pancreas is otherwise within normal limits. Spleen: Moderately enlarged, unchanged. Adrenals/Urinary Tract: There are punctate nonobstructing bilateral renal calculi. There is no hydronephrosis or perinephric stranding. The adrenal glands and bladder are within normal limits. Stomach/Bowel: No evidence of bowel wall thickening, distention, or inflammatory changes. The appendix is not seen. There is a large amount of stool throughout the colon. There surgical changes in the stomach. Vascular/Lymphatic: Aortic atherosclerosis. No enlarged abdominal or pelvic lymph nodes. Reproductive: Uterus is smaller absent.  Adnexa are unremarkable. Other: There is skin thickening and subcutaneous stranding of the anterior inferior abdominal wall. There is no evidence for soft tissue gas or abscess. There is a small inferior left ventral hernia containing nondilated bowel, unchanged. There is no ascites. Musculoskeletal: Thoracolumbar fusion hardware is present. IMPRESSION: 1. Findings compatible with cellulitis of the anterior inferior abdominal wall. No evidence for soft tissue gas or abscess. 2. Stable hepatosplenomegaly. 3. Nonobstructing bilateral  renal calculi. 4. Stable small left ventral hernia containing nondilated bowel. 5. 14 mm cystic lesion in the head of the pancreas has slightly decreased in size. Recommend follow-up MRI in 1 year. Aortic Atherosclerosis (ICD10-I70.0). Electronically Signed   By: Darliss Cheney M.D.   On: 07/21/2022 23:02   DG Chest Port 1 View  Result Date: 07/21/2022 CLINICAL DATA:  Questionable sepsis. EXAM: PORTABLE CHEST 1 VIEW COMPARISON:  Portable chest 03/10/2022 FINDINGS: Mild cardiomegaly without evidence of CHF. The mediastinum is normally outlined. Minimal calcification of the transverse aorta. There are scattered linear scar-like opacities in the lower lung fields. No focal pneumonia is evident or pleural effusion. There are no acute osseous findings. There is partially visible lower thoracic/lumbar fusion hardware and a reverse right shoulder arthroplasty with left shoulder DJD and chronic rotator cuff arthropathy. Degenerative change thoracic spine. IMPRESSION: 1. Cardiomegaly without evidence of CHF or pneumonia. 2. Aortic atherosclerosis. 3. Chronic changes as above. Electronically Signed   By: Almira Bar M.D.   On: 07/21/2022 21:57    Procedures Procedures    Medications Ordered in ED Medications  vancomycin (VANCOCIN) IVPB 1000 mg/200 mL premix (1,000 mg Intravenous New Bag/Given 07/21/22 2310)  sodium chloride 0.9 % bolus 500 mL (500 mLs Intravenous New Bag/Given 07/21/22 2218)  iohexol (OMNIPAQUE) 300 MG/ML solution 80 mL (75 mLs Intravenous Contrast Given 07/21/22 2237)  morphine (PF) 4 MG/ML injection 4 mg (4 mg Intravenous Given 07/21/22 2254)  ondansetron (ZOFRAN) injection 4 mg (4 mg Intravenous Given 07/21/22 2254)    ED  Course/ Medical Decision Making/ A&P Clinical Course as of 07/21/22 2327  Wed Jul 21, 2022  2325 Patient signed out to Dr. Wallace Cullens pending CT read with plan for admission for IV antibiotics. [VK]    Clinical Course User Index [VK] Rexford Maus, DO                              Medical Decision Making This patient presents to the ED with chief complaint(s) of abdominal wound with pertinent past medical history of DM, CHF, recent admission for abdominal wall cellulitis which further complicates the presenting complaint. The complaint involves an extensive differential diagnosis and also carries with it a high risk of complications and morbidity.    The differential diagnosis includes cellulitis, necrotizing fasciitis less likely as no significant worsening change to the wound since it started, and considering sepsis though hemodynamically stable here, no palpable fluctuance making abscess less likely  Additional history obtained: Additional history obtained from spouse Records reviewed previous admission documents and Care Everywhere/External Records  ED Course and Reassessment: On patient's arrival to the emergency [name] she is hemodynamically stable in no acute distress.  She does have a large abdominal wound with surrounding erythema and warmth, concerning for cellulitis and will have CT abdomen pelvis to evaluate for further deep space infection.  She will have labs performed to evaluate for signs of sepsis.  She was started on gentle fluids and given IV vancomycin.  She will be given pain control and will be closely reassessed.  Independent labs interpretation:  The following labs were independently interpreted: hyperglycemia without evidence of DKA, labs otherwise at baseline  Independent visualization of imaging: - I independently visualized the following imaging with scope of interpretation limited to determining acute life threatening conditions related to emergency care: CTAP, which revealed cellulitis without deeper infection      Amount and/or Complexity of Data Reviewed Labs: ordered. Radiology: ordered. ECG/medicine tests: ordered.  Risk Prescription drug management.          Final Clinical Impression(s) / ED  Diagnoses Final diagnoses:  Cellulitis of abdominal wall    Rx / DC Orders ED Discharge Orders     None         Rexford Maus, DO 07/21/22 2327

## 2022-07-21 NOTE — ED Provider Notes (Signed)
  Provider Note MRN:  [id number]  Arrival date & time: 07/22/22    ED Course and Medical Decision Making  Assumed care from Dr Theresia Lo at shift change.  See note from prior team for complete details, in brief:   72 yo female Hx dm Recent admit at OSH 2/2 abd pannus cellulitis On oral zyvox Sent by wound  care nurse for worsening appearance of wound  WBC 1.9, hgb 7.2 > Hgb was 8.2 4/23 AKI cr 1.53, BUN 71 CTAP with abd wall cellulitis Started on vancomycin, sent ctx Not septic   Plan per prior physician f/u CT and admit CTAP shows cellulitis w/o abscess, stable o/w  D/w [facility] who accepts pt for admit  .Critical Care  Performed by: Sloan Leiter, DO Authorized by: Sloan Leiter, DO   Critical care provider statement:    Critical care time (minutes):  32   Critical care time was exclusive of:  Teaching time   Critical care was necessary to treat or prevent imminent or life-threatening deterioration of the following conditions:  Dehydration   Critical care was time spent personally by me on the following activities:  Development of treatment plan with patient or surrogate, discussions with consultants, evaluation of patient's response to treatment, examination of patient, ordering and review of laboratory studies, ordering and review of radiographic studies, ordering and performing treatments and interventions, pulse oximetry, re-evaluation of patient's condition, review of old charts and obtaining history from patient or surrogate   Care discussed with: admitting provider     Final Clinical Impressions(s) / ED Diagnoses     ICD-10-CM   1. Cellulitis of abdominal wall  L03.311     2. AKI (acute kidney injury) (HCC)  N17.9     3. Anemia, unspecified type  D64.9       ED Discharge Orders     None       Discharge Instructions   None        Sloan Leiter, DO 07/22/22 [id number]

## 2022-07-21 NOTE — ED Notes (Signed)
Abdominal wound redressed with non stick telfa dressing and 2 ABD pads. Secured with paper tape.

## 2022-07-22 ENCOUNTER — Encounter (HOSPITAL_COMMUNITY): Payer: Self-pay | Admitting: Internal Medicine

## 2022-07-22 DIAGNOSIS — T8141XA Infection following a procedure, superficial incisional surgical site, initial encounter: Secondary | ICD-10-CM | POA: Diagnosis present

## 2022-07-22 DIAGNOSIS — I130 Hypertensive heart and chronic kidney disease with heart failure and stage 1 through stage 4 chronic kidney disease, or unspecified chronic kidney disease: Secondary | ICD-10-CM | POA: Diagnosis present

## 2022-07-22 DIAGNOSIS — D638 Anemia in other chronic diseases classified elsewhere: Secondary | ICD-10-CM | POA: Diagnosis not present

## 2022-07-22 DIAGNOSIS — L03311 Cellulitis of abdominal wall: Secondary | ICD-10-CM

## 2022-07-22 DIAGNOSIS — E0500 Thyrotoxicosis with diffuse goiter without thyrotoxic crisis or storm: Secondary | ICD-10-CM

## 2022-07-22 DIAGNOSIS — N19 Unspecified kidney failure: Secondary | ICD-10-CM | POA: Diagnosis not present

## 2022-07-22 DIAGNOSIS — K862 Cyst of pancreas: Secondary | ICD-10-CM | POA: Diagnosis present

## 2022-07-22 DIAGNOSIS — F32A Depression, unspecified: Secondary | ICD-10-CM | POA: Diagnosis present

## 2022-07-22 DIAGNOSIS — N1831 Chronic kidney disease, stage 3a: Secondary | ICD-10-CM | POA: Diagnosis present

## 2022-07-22 DIAGNOSIS — I5032 Chronic diastolic (congestive) heart failure: Secondary | ICD-10-CM | POA: Diagnosis present

## 2022-07-22 DIAGNOSIS — Y838 Other surgical procedures as the cause of abnormal reaction of the patient, or of later complication, without mention of misadventure at the time of the procedure: Secondary | ICD-10-CM | POA: Diagnosis present

## 2022-07-22 DIAGNOSIS — I96 Gangrene, not elsewhere classified: Secondary | ICD-10-CM | POA: Diagnosis not present

## 2022-07-22 DIAGNOSIS — E1122 Type 2 diabetes mellitus with diabetic chronic kidney disease: Secondary | ICD-10-CM | POA: Diagnosis present

## 2022-07-22 DIAGNOSIS — E1142 Type 2 diabetes mellitus with diabetic polyneuropathy: Secondary | ICD-10-CM

## 2022-07-22 DIAGNOSIS — L02211 Cutaneous abscess of abdominal wall: Secondary | ICD-10-CM | POA: Diagnosis not present

## 2022-07-22 DIAGNOSIS — J4540 Moderate persistent asthma, uncomplicated: Secondary | ICD-10-CM

## 2022-07-22 DIAGNOSIS — Z794 Long term (current) use of insulin: Secondary | ICD-10-CM | POA: Diagnosis not present

## 2022-07-22 DIAGNOSIS — M7989 Other specified soft tissue disorders: Secondary | ICD-10-CM | POA: Diagnosis not present

## 2022-07-22 DIAGNOSIS — E039 Hypothyroidism, unspecified: Secondary | ICD-10-CM | POA: Diagnosis present

## 2022-07-22 DIAGNOSIS — E1165 Type 2 diabetes mellitus with hyperglycemia: Secondary | ICD-10-CM | POA: Diagnosis present

## 2022-07-22 DIAGNOSIS — I10 Essential (primary) hypertension: Secondary | ICD-10-CM | POA: Diagnosis not present

## 2022-07-22 DIAGNOSIS — E1152 Type 2 diabetes mellitus with diabetic peripheral angiopathy with gangrene: Secondary | ICD-10-CM | POA: Diagnosis present

## 2022-07-22 DIAGNOSIS — F418 Other specified anxiety disorders: Secondary | ICD-10-CM | POA: Diagnosis not present

## 2022-07-22 DIAGNOSIS — M726 Necrotizing fasciitis: Secondary | ICD-10-CM | POA: Diagnosis not present

## 2022-07-22 DIAGNOSIS — G2581 Restless legs syndrome: Secondary | ICD-10-CM | POA: Diagnosis present

## 2022-07-22 DIAGNOSIS — E1140 Type 2 diabetes mellitus with diabetic neuropathy, unspecified: Secondary | ICD-10-CM | POA: Diagnosis present

## 2022-07-22 DIAGNOSIS — D62 Acute posthemorrhagic anemia: Secondary | ICD-10-CM | POA: Diagnosis not present

## 2022-07-22 DIAGNOSIS — D61818 Other pancytopenia: Secondary | ICD-10-CM | POA: Diagnosis present

## 2022-07-22 DIAGNOSIS — N179 Acute kidney failure, unspecified: Secondary | ICD-10-CM

## 2022-07-22 DIAGNOSIS — E7800 Pure hypercholesterolemia, unspecified: Secondary | ICD-10-CM | POA: Diagnosis present

## 2022-07-22 DIAGNOSIS — E782 Mixed hyperlipidemia: Secondary | ICD-10-CM | POA: Diagnosis not present

## 2022-07-22 DIAGNOSIS — L929 Granulomatous disorder of the skin and subcutaneous tissue, unspecified: Secondary | ICD-10-CM | POA: Diagnosis not present

## 2022-07-22 DIAGNOSIS — Z6841 Body Mass Index (BMI) 40.0 and over, adult: Secondary | ICD-10-CM | POA: Diagnosis not present

## 2022-07-22 DIAGNOSIS — D509 Iron deficiency anemia, unspecified: Secondary | ICD-10-CM | POA: Diagnosis present

## 2022-07-22 DIAGNOSIS — E119 Type 2 diabetes mellitus without complications: Secondary | ICD-10-CM | POA: Diagnosis not present

## 2022-07-22 LAB — CBC WITH DIFFERENTIAL/PLATELET
Abs Immature Granulocytes: 0.01 10*3/uL (ref 0.00–0.07)
Basophils Absolute: 0 10*3/uL (ref 0.0–0.1)
Basophils Relative: 0 %
Eosinophils Absolute: 0 10*3/uL (ref 0.0–0.5)
Eosinophils Relative: 0 %
HCT: 23.6 % — ABNORMAL LOW (ref 36.0–46.0)
Hemoglobin: 7.2 g/dL — ABNORMAL LOW (ref 12.0–15.0)
Immature Granulocytes: 1 %
Lymphocytes Relative: 29 %
Lymphs Abs: 0.5 10*3/uL — ABNORMAL LOW (ref 0.7–4.0)
MCH: 26.1 pg (ref 26.0–34.0)
MCHC: 30.5 g/dL (ref 30.0–36.0)
MCV: 85.5 fL (ref 80.0–100.0)
Monocytes Absolute: 0.2 10*3/uL (ref 0.1–1.0)
Monocytes Relative: 10 %
Neutro Abs: 1 10*3/uL — ABNORMAL LOW (ref 1.7–7.7)
Neutrophils Relative %: 60 %
Platelets: 104 10*3/uL — ABNORMAL LOW (ref 150–400)
RBC: 2.76 MIL/uL — ABNORMAL LOW (ref 3.87–5.11)
RDW: 14 % (ref 11.5–15.5)
WBC: 1.6 10*3/uL — ABNORMAL LOW (ref 4.0–10.5)
nRBC: 0 % (ref 0.0–0.2)

## 2022-07-22 LAB — COMPREHENSIVE METABOLIC PANEL
ALT: 8 U/L (ref 0–44)
AST: 10 U/L — ABNORMAL LOW (ref 15–41)
Albumin: 2.9 g/dL — ABNORMAL LOW (ref 3.5–5.0)
Alkaline Phosphatase: 47 U/L (ref 38–126)
Anion gap: 8 (ref 5–15)
BUN: 59 mg/dL — ABNORMAL HIGH (ref 8–23)
CO2: 28 mmol/L (ref 22–32)
Calcium: 8.7 mg/dL — ABNORMAL LOW (ref 8.9–10.3)
Chloride: 100 mmol/L (ref 98–111)
Creatinine, Ser: 1.3 mg/dL — ABNORMAL HIGH (ref 0.44–1.00)
GFR, Estimated: 44 mL/min — ABNORMAL LOW (ref >60)
Glucose, Bld: 209 mg/dL — ABNORMAL HIGH (ref 70–99)
Potassium: 4.7 mmol/L (ref 3.5–5.1)
Sodium: 136 mmol/L (ref 135–145)
Total Bilirubin: 0.6 mg/dL (ref 0.3–1.2)
Total Protein: 6.9 g/dL (ref 6.5–8.1)

## 2022-07-22 LAB — GLUCOSE, CAPILLARY
Glucose-Capillary: 180 mg/dL — ABNORMAL HIGH (ref 70–99)
Glucose-Capillary: 192 mg/dL — ABNORMAL HIGH (ref 70–99)
Glucose-Capillary: 139 mg/dL — ABNORMAL HIGH (ref 70–99)
Glucose-Capillary: 217 mg/dL — ABNORMAL HIGH (ref 70–99)
Glucose-Capillary: 94 mg/dL (ref 70–99)
Glucose-Capillary: 200 mg/dL — ABNORMAL HIGH (ref 70–99)

## 2022-07-22 LAB — SURGICAL PCR SCREEN
MRSA, PCR: NEGATIVE
Staphylococcus aureus: NEGATIVE

## 2022-07-22 LAB — IRON AND TIBC
Iron: 122 ug/dL (ref 28–170)
Saturation Ratios: 34 % — ABNORMAL HIGH (ref 10.4–31.8)
TIBC: 356 ug/dL (ref 250–450)
UIBC: 234 ug/dL

## 2022-07-22 LAB — CULTURE, BLOOD (ROUTINE X 2)

## 2022-07-22 LAB — CREATININE, URINE, RANDOM: Creatinine, Urine: 40 mg/dL

## 2022-07-22 LAB — RETICULOCYTES
Immature Retic Fract: 2.2 % — ABNORMAL LOW (ref 2.3–15.9)
RBC.: 2.69 MIL/uL — ABNORMAL LOW (ref 3.87–5.11)
Retic Count, Absolute: 13.5 10*3/uL — ABNORMAL LOW (ref 19.0–186.0)
Retic Ct Pct: 0.5 % (ref 0.4–3.1)

## 2022-07-22 LAB — MAGNESIUM
Magnesium: 2.3 mg/dL (ref 1.7–2.4)
Magnesium: 2.4 mg/dL (ref 1.7–2.4)

## 2022-07-22 LAB — FERRITIN: Ferritin: 100 ng/mL (ref 11–307)

## 2022-07-22 LAB — HEMOGLOBIN A1C
Hgb A1c MFr Bld: 6.8 % — ABNORMAL HIGH (ref 4.8–5.6)
Mean Plasma Glucose: 148.46 mg/dL

## 2022-07-22 LAB — SODIUM, URINE, RANDOM: Sodium, Ur: 63 mmol/L

## 2022-07-22 MED ORDER — SODIUM CHLORIDE 0.9 % IV SOLN
1.0000 g | INTRAVENOUS | Status: DC
  Administered 2022-07-22 – 2022-07-24 (×3): 1 g via INTRAVENOUS
  Filled 2022-07-22 (×3): qty 10

## 2022-07-22 MED ORDER — ACETAMINOPHEN 325 MG PO TABS
650.0000 mg | ORAL_TABLET | Freq: Four times a day (QID) | ORAL | Status: DC | PRN
  Administered 2022-07-22 – 2022-07-26 (×4): 650 mg via ORAL
  Filled 2022-07-22 (×4): qty 2

## 2022-07-22 MED ORDER — INSULIN ASPART 100 UNIT/ML IJ SOLN
0.0000 [IU] | Freq: Three times a day (TID) | INTRAMUSCULAR | Status: DC
  Administered 2022-07-22: 1 [IU] via SUBCUTANEOUS
  Administered 2022-07-22 – 2022-07-23 (×2): 3 [IU] via SUBCUTANEOUS
  Administered 2022-07-23: 1 [IU] via SUBCUTANEOUS
  Administered 2022-07-24 (×3): 2 [IU] via SUBCUTANEOUS
  Administered 2022-07-25 (×3): 1 [IU] via SUBCUTANEOUS
  Administered 2022-07-26 – 2022-07-27 (×3): 2 [IU] via SUBCUTANEOUS
  Administered 2022-07-27: 1 [IU] via SUBCUTANEOUS
  Administered 2022-07-28: 2 [IU] via SUBCUTANEOUS
  Administered 2022-07-28 – 2022-07-29 (×2): 1 [IU] via SUBCUTANEOUS
  Filled 2022-07-22: qty 0.09

## 2022-07-22 MED ORDER — INSULIN GLARGINE-YFGN 100 UNIT/ML ~~LOC~~ SOLN
10.0000 [IU] | Freq: Two times a day (BID) | SUBCUTANEOUS | Status: DC
  Administered 2022-07-22 – 2022-07-30 (×16): 10 [IU] via SUBCUTANEOUS
  Filled 2022-07-22 (×18): qty 0.1

## 2022-07-22 MED ORDER — SODIUM CHLORIDE 0.9 % IV BOLUS
500.0000 mL | Freq: Once | INTRAVENOUS | Status: AC
  Administered 2022-07-22: 500 mL via INTRAVENOUS

## 2022-07-22 MED ORDER — FENTANYL CITRATE PF 50 MCG/ML IJ SOSY
50.0000 ug | PREFILLED_SYRINGE | INTRAMUSCULAR | Status: DC | PRN
  Administered 2022-07-22 – 2022-07-28 (×19): 50 ug via INTRAVENOUS
  Filled 2022-07-22 (×20): qty 1

## 2022-07-22 MED ORDER — DIPHENHYDRAMINE HCL 25 MG PO CAPS
25.0000 mg | ORAL_CAPSULE | Freq: Once | ORAL | Status: AC
  Administered 2022-07-22: 25 mg via ORAL
  Filled 2022-07-22: qty 1

## 2022-07-22 MED ORDER — PANTOPRAZOLE SODIUM 40 MG PO TBEC
40.0000 mg | DELAYED_RELEASE_TABLET | Freq: Every day | ORAL | Status: DC
  Administered 2022-07-22 – 2022-07-30 (×8): 40 mg via ORAL
  Filled 2022-07-22 (×8): qty 1

## 2022-07-22 MED ORDER — MOMETASONE FURO-FORMOTEROL FUM 200-5 MCG/ACT IN AERO
2.0000 | INHALATION_SPRAY | Freq: Two times a day (BID) | RESPIRATORY_TRACT | Status: DC
  Administered 2022-07-22 – 2022-07-29 (×16): 2 via RESPIRATORY_TRACT
  Filled 2022-07-22 (×2): qty 8.8

## 2022-07-22 MED ORDER — ALBUTEROL SULFATE (2.5 MG/3ML) 0.083% IN NEBU
2.5000 mg | INHALATION_SOLUTION | RESPIRATORY_TRACT | Status: DC | PRN
  Filled 2022-07-22: qty 3

## 2022-07-22 MED ORDER — FLUOXETINE HCL 20 MG PO CAPS
40.0000 mg | ORAL_CAPSULE | Freq: Every day | ORAL | Status: DC
  Administered 2022-07-22 – 2022-07-29 (×8): 40 mg via ORAL
  Filled 2022-07-22 (×8): qty 2

## 2022-07-22 MED ORDER — LACTATED RINGERS IV SOLN: INTRAVENOUS | Status: AC

## 2022-07-22 MED ORDER — GABAPENTIN 300 MG PO CAPS
300.0000 mg | ORAL_CAPSULE | Freq: Three times a day (TID) | ORAL | Status: DC
  Administered 2022-07-22 – 2022-07-30 (×24): 300 mg via ORAL
  Filled 2022-07-22 (×9): qty 3
  Filled 2022-07-22 (×2): qty 1
  Filled 2022-07-22 (×3): qty 3
  Filled 2022-07-22: qty 1
  Filled 2022-07-22 (×9): qty 3

## 2022-07-22 MED ORDER — VANCOMYCIN HCL 1250 MG/250ML IV SOLN
1250.0000 mg | INTRAVENOUS | Status: DC
  Administered 2022-07-23: 1250 mg via INTRAVENOUS
  Filled 2022-07-22: qty 250

## 2022-07-22 MED ORDER — METOPROLOL TARTRATE 50 MG PO TABS
50.0000 mg | ORAL_TABLET | Freq: Three times a day (TID) | ORAL | Status: DC
  Administered 2022-07-22 – 2022-07-30 (×23): 50 mg via ORAL
  Filled 2022-07-22 (×24): qty 1

## 2022-07-22 MED ORDER — PROPYLTHIOURACIL 50 MG PO TABS
50.0000 mg | ORAL_TABLET | Freq: Every day | ORAL | Status: DC
  Administered 2022-07-22 – 2022-07-30 (×8): 50 mg via ORAL
  Filled 2022-07-22 (×9): qty 1

## 2022-07-22 MED ORDER — FENOFIBRATE 54 MG PO TABS
54.0000 mg | ORAL_TABLET | Freq: Every day | ORAL | Status: DC
  Administered 2022-07-22 – 2022-07-29 (×7): 54 mg via ORAL
  Filled 2022-07-22 (×8): qty 1

## 2022-07-22 MED ORDER — PANCRELIPASE (LIP-PROT-AMYL) 12000-38000 UNITS PO CPEP
72000.0000 [IU] | ORAL_CAPSULE | Freq: Three times a day (TID) | ORAL | Status: DC
  Administered 2022-07-22 – 2022-07-30 (×21): 72000 [IU] via ORAL
  Filled 2022-07-22 (×22): qty 6

## 2022-07-22 MED ORDER — LINEZOLID 600 MG/300ML IV SOLN: 600.0000 mg | Freq: Two times a day (BID) | INTRAVENOUS | Status: DC

## 2022-07-22 MED ORDER — MELATONIN 3 MG PO TABS
3.0000 mg | ORAL_TABLET | Freq: Every evening | ORAL | Status: DC | PRN
  Administered 2022-07-22 – 2022-07-28 (×2): 3 mg via ORAL
  Filled 2022-07-22 (×2): qty 1

## 2022-07-22 MED ORDER — NALOXONE HCL 0.4 MG/ML IJ SOLN: 0.4000 mg | INTRAMUSCULAR | Status: DC | PRN

## 2022-07-22 MED ORDER — ACETAMINOPHEN 650 MG RE SUPP: 650.0000 mg | Freq: Four times a day (QID) | RECTAL | Status: DC | PRN

## 2022-07-22 MED ORDER — ONDANSETRON HCL 4 MG/2ML IJ SOLN
4.0000 mg | Freq: Four times a day (QID) | INTRAMUSCULAR | Status: DC | PRN
  Administered 2022-07-22 – 2022-07-29 (×13): 4 mg via INTRAVENOUS
  Filled 2022-07-22 (×12): qty 2

## 2022-07-22 NOTE — Hospital Course (Addendum)
Taken from H&P.   [name] is a 72 y.o. female with medical history significant for type 2 diabetes mellitus, hyperlipidemia, essential hypertension, hypothyroidism, chronic diastolic heart failure, chronic pancytopenia, who is admitted to [facility] on 07/21/2022 with abdominal wall wound with surrounding cellulitis.  The patient was recently hospitalized at [facility] for cellulitis involving the abdominal wall, for which she was discharged home 1 week ago on oral linezolid.  Over the last 2 to 3 days, she has noted further progression and associated erythema over the abdomen, associated with increased tenderness, swelling, increased warmth to the touch.  She underwent routine follow-up with wound care earlier on 07/21/2022, who agreed that the advantages interval worsening in erythema, prompting wound care RN to recommend that the patient present to the emergency department for further evaluation and management.  ED course.  Vital stable, afebrile.  Labs pertinent for creatinine of 1.53, most recent on 4/13 was 1.36, it was below 1 in February 2024.  WBC of 1.9, compared to 2.7 on 07/13/2022.  Hemoglobin 7.26 as compared to 8.2 on 4/23.  UA was negative for UTI. Blood cultures were collected.  CT chest was without any acute abnormality. CT abdominal and pelvis evidence of cellulitis of anterior inferior abdominal wall, without evidence of subcutaneous gas or abscesses, did show a 14 mm cystic lesion in the head of pancreas which is slightly decreased as compared to the prior CT with radiology recommendations of follow-up MRI in 1 year.  She received IV fluid and placed on ceftriaxone and vancomycin.  5/2: Patient remained afebrile.  Labs with inappropriately normal reticulocyte count, iron and ferritin normal.  WBC 1.6, hemoglobin 7.2 and platelets of 104.  Creatinine improved to 1.30 with IV fluid. Patient with recent admission with [facility] with sepsis and septic  shock, cultures at that time grew MSSA, rare Enterococcus Hartery and rare Streptococcus pyogenes per care everywhere.  She was discharge with wound care and linezolid.  Per patient she developed some blisters followed by open wound in her belly during that time. Her left lower quadrant wound continued to get worse so her wound care facility asked her to come to ED. On exam she has a large left lower quadrant abdominal wound with a lot of eschar, pustular drainage and surrounding erythema.  Wound care General surgery was consulted.  As patient will get benefit from debridement.

## 2022-07-22 NOTE — H&P (Signed)
History and Physical      [name]:[id number] DOB: 05/19/1950 DOA: 07/21/2022; DOS: 07/22/2022  PCP: Rodrigo Ran, MD  Patient coming from: home   I have personally briefly reviewed patient's old medical records in [facility]  Chief Complaint: Worsening redness over the abdomen  HPI: [name] is a 72 y.o. female with medical history significant for type 2 diabetes mellitus, hyperlipidemia, essential hypertension, hypothyroidism, chronic diastolic heart failure, chronic pancytopenia, who is admitted to [facility] on 07/21/2022 with abdominal wall cellulitis after presenting from home to [facility] ED complaining of worsening redness over the abdomen.   The patient was recently hospitalized at [facility] for cellulitis involving the abdominal wall, for which she was discharged home 1 week ago on oral linezolid.  Over the last 2 to 3 days, she has noted further progression and associated erythema over the abdomen, associated with increased tenderness, swelling, increased warmth to the touch.  She underwent routine follow-up with wound care earlier on 07/21/2022, who agreed that the advantages interval worsening in erythema, prompting wound care RN to recommend that the patient present to the emergency department for further evaluation and management thereof.  The patient denies rash in any other site.  She denies any associated subjective fever, chills, rigors, or generalized nauseous.  No recent chest pain, shortness of breath, cough, palpitations, diaphoresis, dizziness, presyncope, or syncope.  No recent nausea or vomiting.  Her medical history is notable for chronic pancytopenia, including baseline hemoglobin of 8-10.  Medical history also notable for chronic diastolic heart failure, with most recent echocardiogram performed in December 2023, which is notable for LVEF 55 to 60%, no focal motion or maladies, grade 1 diastolic dysfunction, normal right ventricular systolic function,  and mild mitral regurgitation as well as mild aortic regurgitation.     ED Course:  Vital signs in the ED were notable for the following: Afebrile; heart rates in the 70s; systolic blood pressures 130s to 150s; respiratory rate 16-18, oxygen saturation 95% on room air.  Labs were notable for the following: CMP notable for the following: Bicarbonate 26, creatinine 1.53 compared to 0.97 in February 2024 followed by 1.49 on 07/11/2022, and most recent serum creatinine data point of 1.36 on 07/13/2022, glucose 226, BUN to creatinine ratio 46, calcium, just for mild hypoalbuminemia noted to be 9.4, albumin 3.0, otherwise, liver enzymes were within normal limits.  Lactic acid 1.8.  CBC notable for white blood cell count 1.9 compared to 2.7 on 07/13/2022, hemoglobin 7.26 with normocytic/recurrent properties as well as nonelevated RDW, and relative to most recent prior hemoglobin of 8.2 on 07/13/2022, platelet count 115 compared to 145 on 07/13/2022.  INR 1.2.  Urinalysis showed no white blood cells and was nitrate negative.  Blood cultures x 2 were collected prior to initiation of IV antibiotics.  Per my interpretation, EKG in ED demonstrated the following: Sinus rhythm with heart rate 69, normal intervals, no evidence of T wave or ST changes, including evidence of ST elevation.  1 view chest x-ray, performed radiology read, shows no evidence of acute cardiopulmonary process, including no evidence of infiltrate, edema, effusion, or pneumothorax.  CT abdomen/pelvis with contrast, comparison to most recent prior CT abdomen/pelvis performed on 10/06/2021, performed radiology read, shows evidence consistent with cellulitis of the anterior inferior abdominal wall, without evidence of subcutaneous gas or abscess, while showing a 14 mm cystic lesion in the head of the pancreas, which is slightly decreased in size relative to most recent prior  CT abdomen/pelvis, with corresponding radiology recommendation for follow-up MRI in  1 year.  While in the ED, the following were administered: Morphine 4 mg IV x 1, Zofran 4 mg IV x 1, normal saline x 1 L bolus, IV vancomycin.  Subsequently, the patient was admitted for further evaluation management of abdominal wall cellulitis, with presentation also notable for acute kidney injury and prerenal azotemia as well as acute on chronic anemia within the framework of chronic pancytopenia.     Review of Systems: As per HPI otherwise 10 point review of systems negative.   Past Medical History:  Diagnosis Date   Anemia    Arthritis    Asthma    OCCAS   Bell's palsy    Broken neck (HCC)    C-1   Carbuncle and furuncle of trunk    Chronic dislocation of right shoulder    Depression    Disorder of fascia    HX OF NECROTIC FASCITIS AFTER ABDOMINAL SURGERY FOR HERNIA- REQUIRED 19 SURGERIES AND 2.5 MONTH HOSPITALIZATION AT [facility]   DM (diabetes mellitus) (HCC)    Dysrhythmia    HX OF TACHYCARDIA AND BRADYCARDIA - ON GOING FOR YEARS - DOES NOT HAVE TO SEE CARDIOLOGIST   Elevated cholesterol    Fibromyalgia    Fracture MAY 2014   HX OF FRACTURED NECK C1- CAUSES SEVERE HEADACHES--LIMITED ROM NECK   Frequent infections    ESPECIALLY PRONE TO INFECTIONS AFTER SURGERIES   Graves disease    Heart murmur    DOES NOT CAUSE ANY PROBLEMS   History of kidney stones    Hyperlipidemia    Hypertension    Hypothyroidism    GRAVES DISEASE   Migraine    Nephrolithiasis    STAGE 3    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   Neuropathy    Obesity    OSA (obstructive sleep apnea)    USES CPAP - DOES NOT KNOW SETTING   Pain    LEFT SHOULDER  PAIN -HARD TO LIE ON LEFT SIDE FOR LONG PERIOD;  PAIN IN LOWER BADK - 3 HERNIATED DISCS AND STENOISIS -   PONV (postoperative nausea and vomiting)    THE GAS MAKES ME NAUSEATED   Restless leg syndrome    Sciatica    Tachycardia    Urinary frequency    Urticaria    UTI (urinary tract infection)     Past Surgical History:  Procedure Laterality  Date   ABDOMINAL HYSTERECTOMY     LARGE TUMOR AT OVARY REMOVED   ANTERIOR LAT LUMBAR FUSION N/A 07/28/2018   Procedure: Anterolateral Decompression Lumbar One-Two for osteomyelitis reconstruction w/titanium strut allograft Fusion Lumbar One-Two;  Surgeon: Barnett Abu, MD;  Location: [facility] OR;  Service: Neurosurgery;  Laterality: N/A;  Left anterolateral approach   APPENDECTOMY     APPLICATION OF ROBOTIC ASSISTANCE FOR SPINAL PROCEDURE N/A 07/28/2018   Procedure: APPLICATION OF ROBOTIC ASSISTANCE FOR SPINAL PROCEDURE;  Surgeon: Barnett Abu, MD;  Location: [facility] OR;  Service: Neurosurgery;  Laterality: N/A;   CARPAL TUNNEL RELEASE     BILATERAL   CATARACT EXTRACTION W/ INTRAOCULAR LENS  IMPLANT, BILATERAL     CESAREAN SECTION     X 3   CHOLECYSTECTOMY     CYSTOSCOPY WITH URETEROSCOPY Right 12/03/2013   Procedure: CYSTOSCOPY WITH RIGHT RETROGRADE URETEROSCOPY LASER LITHOTRIPSY RIGHT STONE RIGHT URETERAL STENT, ;  Surgeon: Heloise Purpura, MD;  Location: [facility] ORS;  Service: Urology;  Laterality: Right;  PROCEDURE WAS ORIGINALLY SCHEDULED AS RIGHT PERCUTANEOUS  NEPHROLITHOTOMY   EYELID LACERATION REPAIR     RIGHT EYE   HERNIA REPAIR     ABDOMINAL HERNIA REPAIR WITH MESH - 3 SURGERIES    HX OF 19 SURGERIES FOR NECROTIC FASCITIS     SKIN GRAFTS+ WOUND  VAC   I&D OF INFECTED SITE IN [facility] - FROM AN INJECTION     IR FLUORO GUIDED NEEDLE PLC ASPIRATION/INJECTION LOC  06/03/2018   JOINT REPLACEMENT     TOTAL RIGHT KNEE REPLACEMENT   KNEE ARTHROSCOPY  RIGHT AND LEFT   X 2   LAPAROSCOPIC GASTRIC SLEEVE RESECTION  2014   NEPHROLITHOTOMY Right 12/10/2013   Procedure: NEPHROLITHOTOMY PERCUTANEOUS;  Surgeon: Heloise Purpura, MD;  Location: [facility];  Service: Urology;  Laterality: Right;   POSTERIOR LUMBAR FUSION 4 LEVEL N/A 07/28/2018   Procedure: Posterior Fixation Thoracic Ten-Lumbar Four with pedicle augmentaion with robotic assistance;  Surgeon: Barnett Abu, MD;  Location: [facility] OR;  Service: Neurosurgery;   Laterality: N/A;  Posterior Fixation Thoracic Ten-Lumbar Four with pedicle augmentaion with robotic assistance   REVERSE SHOULDER ARTHROPLASTY Right 07/21/2017   REVERSE SHOULDER ARTHROPLASTY Right 07/21/2017   Procedure: RIGHT REVERSE SHOULDER ARTHROPLASTY;  Surgeon: Jones Broom, MD;  Location: [facility] OR;  Service: Orthopedics;  Laterality: Right;   RIGHT FOOT DRAINAGE OF INFECTION     shoulder arthroscopy Right    X 2   TONSILLECTOMY     AND ADENOIDECTOMY    Social History:  reports that she has never smoked. She has never used smokeless tobacco. She reports that she does not drink alcohol and does not use drugs.   Allergies  Allergen Reactions   Sumatriptan Other (See Comments) and Anaphylaxis    Vascular spasms  Other Reaction(s): Other  Facial spasms, Vasospasms   Codeine Nausea And Vomiting    Other Reaction(s): GI Intolerance   Nsaids Other (See Comments)    PT UNABLE TO TOLERATE NSAID'S DRUGS DUE TO HX OF GASTRIC SLEEVE SURGERY   Statins Other (See Comments)    Leg/muscle pain   Sulfonamide Derivatives Hives    Childhood allergy    Hydrocodone-Acetaminophen Nausea And Vomiting    Other Reaction(s): GI Intolerance   Methimazole Hives   Niacin     heart pounding and beating fast.   Promethazine Hcl Other (See Comments)    Restless leg feeling all over body   Vicodin [Hydrocodone-Acetaminophen] Nausea And Vomiting    Family History  Problem Relation Age of Onset   Stroke Mother    Bladder Cancer Mother    Diabetes Father    Osteoarthritis Father    Heart disease Father    Ulcers Father    Suicidality Sister    Colon cancer Neg Hx    Stomach cancer Neg Hx    Rectal cancer Neg Hx    Esophageal cancer Neg Hx    Pancreatic cancer Neg Hx     Family history reviewed and not pertinent    Prior to Admission medications   Medication Sig Start Date End Date Taking? Authorizing Provider  amLODipine (NORVASC) 5 MG tablet Take 5 mg by mouth at bedtime.   Yes  [provider]  Cholecalciferol (DIALYVITE VITAMIN D 5000) 125 MCG (5000 UT) capsule Take 5,000 Units by mouth daily.   Yes [provider]  clonazePAM (KLONOPIN) 0.5 MG tablet Take 0.5 mg by mouth See admin instructions. Take 0.5 mg at bedtime, may take an additional 0.5 mg tablet twice daily as needed for anxiety 07/01/14  Yes [provider]  CREON 36000-114000 units CPEP capsule Take 72,000 Units by mouth 3 (three) times daily with meals. 2 capsules with each meal and 1 capsule with snacks 05/11/22  Yes [provider]  diclofenac (VOLTAREN) 75 MG EC tablet Take 75 mg by mouth 2 (two) times daily.   Yes [provider]  doxazosin (CARDURA) 2 MG tablet Take 2 mg by mouth daily. 12/04/18  Yes [provider]  fenofibrate 54 MG tablet Take 54 mg by mouth daily with supper. 06/20/17  Yes [provider]  FLUoxetine (PROZAC) 20 MG capsule Take 40 mg by mouth at bedtime. 06/28/17  Yes [provider]  furosemide (LASIX) 40 MG tablet Take 40 mg by mouth daily. 06/17/22  Yes [provider]  gabapentin (NEURONTIN) 300 MG capsule Take 1 capsule (300 mg total) by mouth 3 (three) times daily for 30 days. 07/31/18 07/22/22 Yes Jerald Kief, MD  insulin aspart (NOVOLOG) 100 UNIT/ML FlexPen Sliding scale dose: CBG < 70: implement hypoglycemia protocol  CBG 70 - 120: 0 units  CBG 121 - 150: 0 units  CBG 151 - 200: 0 units  CBG 201 - 250: 2 units  CBG 251 - 300: 3 units  CBG 301 - 350: 4 units  CBG 351 - 400: 5 units Patient taking differently: Inject 20-25 Units into the skin 2 (two) times daily as needed for high blood sugar. 07/31/18  Yes Jerald Kief, MD  Insulin Glargine [facility]) 100 UNIT/ML Inject 20 Units into the skin 2 (two) times daily. 05/11/22  Yes [provider]  Lactobacillus Acid-Pectin (ACIDOPHILUS/PECTIN) CAPS Take 1 capsule by mouth in the morning, at noon, and at bedtime. 07/14/22 07/29/22 Yes  [provider]  magnesium oxide (MAG-OX) 400 (240 Mg) MG tablet Take 1 tablet by mouth daily. 07/14/22  Yes [provider]  metoprolol tartrate (LOPRESSOR) 50 MG tablet Take 50 mg by mouth 3 (three) times daily. 12/04/18  Yes [provider]  nutrition supplement, JUVEN, (JUVEN) PACK Take 1 packet by mouth 2 (two) times daily between meals.   Yes [provider]  omeprazole (PRILOSEC) 20 MG capsule Take 20 mg by mouth daily with supper.    Yes [provider]  oxybutynin (DITROPAN-XL) 10 MG 24 hr tablet Take 10 mg by mouth daily. 06/18/19  Yes [provider]  propylthiouracil (PTU) 50 MG tablet Take 50 mg by mouth daily. 12/22/16  Yes [provider]  rOPINIRole (REQUIP) 2 MG tablet Take 2 mg by mouth See admin instructions. Take 2 mg at bedtime, may take a second 2 mg dose as needed for restless legs   Yes [provider]  SYMBICORT 160-4.5 MCG/ACT inhaler Inhale 2 puffs into the lungs 2 (two) times daily. 11/11/21  Yes [provider]  tiZANidine (ZANAFLEX) 4 MG tablet Take 4 mg by mouth 2 (two) times daily as needed for muscle spasms. 06/18/19  Yes [provider]  VENTOLIN HFA 108 (90 Base) MCG/ACT inhaler Inhale 2 puffs into the lungs every 4 (four) hours as needed for shortness of breath. 11/11/21  Yes [provider]  BD INSULIN SYRINGE U/F 31G X 5/16" 0.5 ML MISC USE 5 TIMES A DAY LEVEMIR AND NOVOLOG 12/13/18   [provider]  BD PEN NEEDLE MICRO U/F 32G X 6 MM MISC Inject 32 pens into the skin daily. 06/18/19   [provider]  FREESTYLE TEST STRIPS test strip USE STRIPS TO TEST BLOOD GLUCOSE BID 10/03/18  [provider]  hyoscyamine (LEVSIN SL) 0.125 MG SL tablet Place 1 tablet (0.125 mg total) under the tongue every 6 (six) hours as needed. Patient not taking: Reported on 07/22/2022 09/17/21   Unk Lightning, PA  Lancets Misc. Roma Kayser 3 COMFORT) MISC USE LANCETS  TO CHECK BLOOD GLUCOSE BID 11/30/18   [provider]  ondansetron (ZOFRAN) 4 MG tablet Take 1 tablet (4 mg total) by mouth every 8 (eight) hours as needed for nausea or vomiting. Patient not taking: Reported on 07/22/2022 03/13/22   Calvert Cantor, MD  oxyCODONE-acetaminophen (PERCOCET/ROXICET) 5-325 MG tablet Take by mouth. Patient not taking: Reported on 07/22/2022 07/06/22   [provider]     Objective    Physical Exam: Vitals:   07/21/22 2043 07/21/22 2112 07/21/22 2306 07/22/22 0100  BP: (!) 134/46  (!) 154/49   Pulse: 71  75   Resp: 16  18   Temp: 97.8 F (36.6 C)   97.8 F (36.6 C)  TempSrc: Oral     SpO2: 95%  95%   Weight:  93.9 kg    Height:  5' (1.524 m)      General: appears to be stated age; alert, oriented Skin: warm, dry, inferior abdominal erythema associated with increased warmth, swelling, tenderness, in the absence of drainage Head:  AT/[place] Mouth:  Oral mucosa membranes appear dry, normal dentition Neck: supple; trachea midline Heart:  RRR; did not appreciate any M/R/G Lungs: CTAB, did not appreciate any wheezes, rales, or rhonchi Abdomen: + BS; soft, abdominal wall.  Heema over the inferior portion of the abdomen, associated increased tenderness, as further detailed above Vascular: 2+ pedal pulses b/l; 2+ radial pulses b/l Extremities: no peripheral edema, no muscle wasting Neuro: strength and sensation intact in upper and lower extremities b/l     Labs on Admission: I have personally reviewed following labs and imaging studies  CBC: Recent Labs  Lab 07/21/22 2200 07/21/22 2216  WBC 1.9*  --   NEUTROABS 1.3*  --   HGB 7.2* 7.1*  HCT 22.5* 21.0*  MCV 85.2  --   PLT 115*  --    Basic Metabolic Panel: Recent Labs  Lab 07/21/22 2200 07/21/22 2216  NA 134* 137  K 4.6 4.7  CL 98 99  CO2 26  --   GLUCOSE 226* 229*  BUN 71* 77*  CREATININE 1.53* 1.60*  CALCIUM 8.6*  --    GFR: Estimated Creatinine Clearance: 33.5 mL/min  (A) (by C-G formula based on SCr of 1.6 mg/dL (H)). Liver Function Tests: Recent Labs  Lab 07/21/22 2200  AST 11*  ALT 9  ALKPHOS 49  BILITOT 0.5  PROT 7.1  ALBUMIN 3.0*   No results for input(s): "LIPASE", "AMYLASE" in the last 168 hours. No results for input(s): "AMMONIA" in the last 168 hours. Coagulation Profile: Recent Labs  Lab 07/21/22 2200  INR 1.2   Cardiac Enzymes: No results for input(s): "CKTOTAL", "CKMB", "CKMBINDEX", "TROPONINI" in the last 168 hours. BNP (last 3 results) No results for input(s): "PROBNP" in the last 8760 hours. HbA1C: No results for input(s): "HGBA1C" in the last 72 hours. CBG: No results for input(s): "GLUCAP" in the last 168 hours. Lipid Profile: No results for input(s): "CHOL", "HDL", "LDLCALC", "TRIG", "CHOLHDL", "LDLDIRECT" in the last 72 hours. Thyroid Function Tests: No results for input(s): "TSH", "T4TOTAL", "FREET4", "T3FREE", "THYROIDAB" in the last 72 hours. Anemia Panel: No results for input(s): "VITAMINB12", "FOLATE", "FERRITIN", "TIBC", "IRON", "RETICCTPCT" in the last 72 hours.  Urine analysis:    Component Value Date/Time   COLORURINE STRAW (A) 07/21/2022 2225   APPEARANCEUR CLEAR 07/21/2022 2225   LABSPEC 1.012 07/21/2022 2225   PHURINE 6.0 07/21/2022 2225   GLUCOSEU 50 (A) 07/21/2022 2225   HGBUR NEGATIVE 07/21/2022 2225   BILIRUBINUR NEGATIVE 07/21/2022 2225   KETONESUR NEGATIVE 07/21/2022 2225   PROTEINUR NEGATIVE 07/21/2022 2225   UROBILINOGEN 0.2 07/20/2014 1707   NITRITE NEGATIVE 07/21/2022 2225   LEUKOCYTESUR TRACE (A) 07/21/2022 2225    Radiological Exams on Admission: CT ABDOMEN PELVIS W CONTRAST  Result Date: 07/21/2022 CLINICAL DATA:  Abdominal wall wound. Evaluate for deep space infection. EXAM: CT ABDOMEN AND PELVIS WITH CONTRAST TECHNIQUE: Multidetector CT imaging of the abdomen and pelvis was performed using the standard protocol following bolus administration of intravenous contrast. RADIATION DOSE  REDUCTION: This exam was performed according to the departmental dose-optimization program which includes automated exposure control, adjustment of the mA and/or kV according to patient size and/or use of iterative reconstruction technique. CONTRAST:  75mL OMNIPAQUE IOHEXOL 300 MG/ML  SOLN COMPARISON:  CT abdomen and pelvis 10/06/2021 FINDINGS: Lower chest: No acute abnormality. Hepatobiliary: The liver is enlarged, unchanged. The gallbladder is surgically absent. There is no biliary ductal dilatation. Pancreas: Rounded 14 mm low-attenuation lesion in the head of the pancreas has slightly decreased in size. Pancreas is otherwise within normal limits. Spleen: Moderately enlarged, unchanged. Adrenals/Urinary Tract: There are punctate nonobstructing bilateral renal calculi. There is no hydronephrosis or perinephric stranding. The adrenal glands and bladder are within normal limits. Stomach/Bowel: No evidence of bowel wall thickening, distention, or inflammatory changes. The appendix is not seen. There is a large amount of stool throughout the colon. There surgical changes in the stomach. Vascular/Lymphatic: Aortic atherosclerosis. No enlarged abdominal or pelvic lymph nodes. Reproductive: Uterus is smaller absent.  Adnexa are unremarkable. Other: There is skin thickening and subcutaneous stranding of the anterior inferior abdominal wall. There is no evidence for soft tissue gas or abscess. There is a small inferior left ventral hernia containing nondilated bowel, unchanged. There is no ascites. Musculoskeletal: Thoracolumbar fusion hardware is present. IMPRESSION: 1. Findings compatible with cellulitis of the anterior inferior abdominal wall. No evidence for soft tissue gas or abscess. 2. Stable hepatosplenomegaly. 3. Nonobstructing bilateral renal calculi. 4. Stable small left ventral hernia containing nondilated bowel. 5. 14 mm cystic lesion in the head of the pancreas has slightly decreased in size. Recommend  follow-up MRI in 1 year. Aortic Atherosclerosis (ICD10-I70.0). Electronically Signed   By: Darliss Cheney M.D.   On: 07/21/2022 23:02   DG Chest Port 1 View  Result Date: 07/21/2022 CLINICAL DATA:  Questionable sepsis. EXAM: PORTABLE CHEST 1 VIEW COMPARISON:  Portable chest 03/10/2022 FINDINGS: Mild cardiomegaly without evidence of CHF. The mediastinum is normally outlined. Minimal calcification of the transverse aorta. There are scattered linear scar-like opacities in the lower lung fields. No focal pneumonia is evident or pleural effusion. There are no acute osseous findings. There is partially visible lower thoracic/lumbar fusion hardware and a reverse right shoulder arthroplasty with left shoulder DJD and chronic rotator cuff arthropathy. Degenerative change thoracic spine. IMPRESSION: 1. Cardiomegaly without evidence of CHF or pneumonia. 2. Aortic atherosclerosis. 3. Chronic changes as above. Electronically Signed   By: Almira Bar M.D.   On: 07/21/2022 21:57      Assessment/Plan    Principal Problem:   Abdominal wall cellulitis Active Problems:   DM2 (diabetes mellitus, type 2) (HCC)   Hyperlipidemia   AKI (acute kidney injury) (  HCC)   Graves disease   Essential hypertension   Pancytopenia (HCC)   Acute prerenal azotemia   Moderate persistent asthma   Chronic diastolic CHF (congestive heart failure) (HCC)    #) Abdominal wall cellulitis: Worsening inferior abdominal wall erythema, tenderness, increased warmth, swelling in spite of recent hospitalization for such with good interval compliance on outpatient oral linezolid, with presenting CT abdomen/pelvis showing findings consistent with abdominal wall cellulitis, without evidence of underlying abscess.  Additionally, CT abdomen/pelvis shows no evidence of subcutaneous gas, will physical exam reveals no evidence of crepitus, rendering necrotizing fasciitis to appear less likely at this time.  As it appears that the patient is failing  outpatient oral antibiotics with linezolid, which should have offered adequate MRSA coverage, will admit for expansion of spectrum of antibiotic coverage, as further detailed below.  Of note, the patient has leukopenia, no additional SIRS criteria are met at this time.  Therefore, criteria for sepsis not currently met.  Blood cultures x 2 were collected in the ED today prior to initiation of IV vancomycin, which will be continued, in addition to initiation of Rocephin.  Of note, presenting lactate was nonelevated at 1.8.  No additional evidence of underlying infectious process, including urinalysis that was inconsistent with UTI, will present chest x-ray shows no evidence of acute cardiopulmonary process, including no evidence of infiltrate.  Plan: Gentle IV fluids.  Follow-up results of blood cultures x 2.  IV vancomycin, Rocephin, as above.  Prn IV fentanyl.  Repeat CBC in the morning.           #) Chronic pancytopenia: Patient carries a diagnosis of chronic pancytopenia, with each of the 3 cell lines decreasing on today's CBC relative to most recent prior labs performed at [facility] on 07/13/2022, as further quantified above.  Differential includes potential myelosuppressive implications of linezolid.  No overt evidence of active bleed.  In terms of the etiology for chronic pancytopenia, source not entirely clear to me at this time.  Will attempt to pursue additional chart review to ascertain suspected underlying etiology of the chronic aspect of her pancytopenia.  Plan: Hold outpatient linezolid, as above.  Repeat CBC with differential in the morning.  Add on iron studies, reticulocyte count.  Type and screen ordered.              #) Acute Kidney Injury:  as quantified above, which appears to be prerenal in nature, concomitant with acute prerenal azotemia identified on today's labs.  Suspect contribution from worsening infection in the form of abdominal wall cellulitis, with potential  pharmacologic exacerbation in the form of home scheduled oral Voltaren, gabapentin, and oxybutynin.  Will initiate gentle IV fluids, with close monitoring for evidence of ensuing development of volume overload given history of chronic diastolic heart failure, while pursuing additional diagnostic evaluation as outlined below.  Of note, today CT abdomen/pelvis showed no evidence of postrenal obstructive etiology.   Plan: monitor strict I's & O's and daily weights. Attempt to avoid nephrotoxic agents.  Hold home oxybutynin, gabapentin, and Voltaren. refrain from NSAIDs. Repeat CMP in the morning. Check serum magnesium level. Add-on random urine sodium and random urine creatinine.  Further evaluation management of presenting abdominal wall cellulitis, as above.            #) Cystic lesion within the head of the pancreas: This is a known finding, with today's CT abdomen/pelvis showing interval decrease in the size of the cystic lesion relative to CT abdomen/pelvis from July 2023, with  associated radiology recommendation for follow-up MRI in 1 year.  Plan: Per radiology recommendation, will recommend follow-up MRI in 1 year for further monitoring.           #) Type 2 Diabetes Mellitus: documented history of such. Home insulin regimen: Glargine 20 units SQ twice daily in addition to sliding scale NovoLog, which she takes on a twice daily basis at home. Home oral hypoglycemic agents: None. presenting blood sugar: 226. Most recent A1c noted to be 5.7% when checked in December 2023.  Is complicated by diabetic peripheral polyneuropathy, for which she is on gabapentin.  Will hold home gabapentin for now in the setting of presenting acute kidney injury. in terms of initial dose of basal insulin to be started during this hospitalization, will resume approximately half of outpatient dose in order to reduce risk for ensuing hypoglycemia  Plan: accuchecks QAC and HS with low dose SSI.  Lantus 10 units  subcu twice daily, as above.  Hold home gabapentin for now, as above.  Add on hemoglobin A1c level.              #) Hyperlipidemia: documented h/o such. On fenofibrate as outpatient.   Plan: continue home fenofibrate.              #) Essential Hypertension: documented h/o such, with outpatient antihypertensive regimen including doxazosin, amlodipine, metoprolol tartrate.  SBP's in the ED today: 130s 150s mmHg. in the setting of presenting infectious process, will be conservative with resumption of home hypertensive medications, as outlined below.  Plan: Close monitoring of subsequent BP via routine VS. for now, will hold home amlodipine and doxazosin, while resuming metoprolol to tartrate.              #) Hyperthyroidism: Documented history of such, with documentation of a history of Graves' disease, on propylthiouracil as an outpatient.  Plan: cont outpatient propylthiouracil .              #) Moderate persistent asthma: Documented history of such, without clinical evidence to suggest acute exacerbation of such.  On Symbicort as well as prn albuterol inhaler as an outpatient.  Plan: Continue outpatient Symbicort..  Albuterol nebulizer.  Check serum magnesium level.                #) Chronic diastolic heart failure: documented history of such, with most recent echocardiogram performed in December 2023, which is notable for LVEF 55 to 60%, grade 1 diastolic dysfunction, with additional findings as outlined above. No clinical or radiographic evidence to suggest acutely decompensated heart failure at this time. home diuretic regimen reportedly consists of the following: Lasix 40 mg p.o. daily.  In the context of presenting acute infection and clinical evidence to suggest mild dehydration, will hold him Lasix for now.  Plan: monitor strict I's & O's and daily weights. Repeat CMP in AM. Check serum mag level.  Hold home Lasix for now, as  above.       DVT prophylaxis: SCD's   Code Status: Full code Family Communication: none Disposition Plan: Per Rounding Team Consults called: none;  Admission status: inpatient    I SPENT GREATER THAN 75  MINUTES IN CLINICAL CARE TIME/MEDICAL DECISION-MAKING IN COMPLETING THIS ADMISSION.     Chaney Born Rochelle Larue DO [facility] From 7PM - 7AM   07/22/2022, 1:19 AM

## 2022-07-22 NOTE — H&P (View-Only) (Signed)
    Consult Note  [name] 07/23/1950  [id number].    Requesting MD: Dr. Amin Chief Complaint/Reason for Consult: abdominal wound  HPI:  72 y.o. female with medical history significant for T2DM, CHF, hyperlipidemia, fibromyalgia, HTN, hypothyroidism, OSA, pancytopenia followed by Dr. Dorsey, multiple prior abdominal surgeries related to soft tissue infections who presented to [facility] ED 5/1 with worsening abdominal wall infection that began earlier last month. She was admitted to [facility] for abdominal wall cellulitis with septic shock - 4/17-4/24. Initially wound was thought to be related to shingles. Wound culture was positive for MSSA, rare Enterococcus hirae, and rare Streptococcus pyogenes (group A). She was discharged on linezolid which she completed. She denies fever and chills.  She has history of abdominal wall necrotizing fasciitis requiring multiple debridements secondary to infection after hernia repair at [facility] in 2018. Last surgery was 03/2017 for skin grafting.  Substance use: none Blood thinners: none Past Surgeries: abdominal hysterectomy, appendectomy, c section x3, cholecystectomy, laparoscopic gastric sleeve (2014), incisional hernia repair, s/p multiple abdominal wall debridements and skin grafts.   ROS: Reviewed and as above  Family History  Problem Relation Age of Onset   Stroke Mother    Bladder Cancer Mother    Diabetes Father    Osteoarthritis Father    Heart disease Father    Ulcers Father    Suicidality Sister    Colon cancer Neg Hx    Stomach cancer Neg Hx    Rectal cancer Neg Hx    Esophageal cancer Neg Hx    Pancreatic cancer Neg Hx     Past Medical History:  Diagnosis Date   Anemia    Arthritis    Asthma    OCCAS   Bell's palsy    Broken neck (HCC)    C-1   Carbuncle and furuncle of trunk    Chronic dislocation of right shoulder    Depression    Disorder of fascia    HX OF NECROTIC FASCITIS AFTER  ABDOMINAL SURGERY FOR HERNIA- REQUIRED 19 SURGERIES AND 2.5 MONTH HOSPITALIZATION AT [facility]   DM (diabetes mellitus) (HCC)    Dysrhythmia    HX OF TACHYCARDIA AND BRADYCARDIA - ON GOING FOR YEARS - DOES NOT HAVE TO SEE CARDIOLOGIST   Elevated cholesterol    Fibromyalgia    Fracture MAY 2014   HX OF FRACTURED NECK C1- CAUSES SEVERE HEADACHES--LIMITED ROM NECK   Frequent infections    ESPECIALLY PRONE TO INFECTIONS AFTER SURGERIES   Graves disease    Heart murmur    DOES NOT CAUSE ANY PROBLEMS   History of kidney stones    Hyperlipidemia    Hypertension    Hypothyroidism    GRAVES DISEASE   Migraine    Nephrolithiasis    STAGE 3    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   Neuropathy    Obesity    OSA (obstructive sleep apnea)    USES CPAP - DOES NOT KNOW SETTING   Pain    LEFT SHOULDER  PAIN -HARD TO LIE ON LEFT SIDE FOR LONG PERIOD;  PAIN IN LOWER BADK - 3 HERNIATED DISCS AND STENOISIS -   PONV (postoperative nausea and vomiting)    THE GAS MAKES ME NAUSEATED   Restless leg syndrome    Sciatica    Tachycardia    Urinary frequency    Urticaria    UTI (urinary tract infection)     Past Surgical History:  Procedure Laterality   Date   ABDOMINAL HYSTERECTOMY     LARGE TUMOR AT OVARY REMOVED   ANTERIOR LAT LUMBAR FUSION N/A 07/28/2018   Procedure: Anterolateral Decompression Lumbar One-Two for osteomyelitis reconstruction w/titanium strut allograft Fusion Lumbar One-Two;  Surgeon: Elsner, Henry, MD;  Location: [facility];  Service: Neurosurgery;  Laterality: N/A;  Left anterolateral approach   APPENDECTOMY     APPLICATION OF ROBOTIC ASSISTANCE FOR SPINAL PROCEDURE N/A 07/28/2018   Procedure: APPLICATION OF ROBOTIC ASSISTANCE FOR SPINAL PROCEDURE;  Surgeon: Elsner, Henry, MD;  Location: [facility];  Service: Neurosurgery;  Laterality: N/A;   CARPAL TUNNEL RELEASE     BILATERAL   CATARACT EXTRACTION W/ INTRAOCULAR LENS  IMPLANT, BILATERAL     CESAREAN SECTION     X 3   CHOLECYSTECTOMY      CYSTOSCOPY WITH URETEROSCOPY Right 12/03/2013   Procedure: CYSTOSCOPY WITH RIGHT RETROGRADE URETEROSCOPY LASER LITHOTRIPSY RIGHT STONE RIGHT URETERAL STENT, ;  Surgeon: Lester Borden, MD;  Location: [facility];  Service: Urology;  Laterality: Right;  PROCEDURE WAS ORIGINALLY SCHEDULED AS RIGHT PERCUTANEOUS NEPHROLITHOTOMY   EYELID LACERATION REPAIR     RIGHT EYE   HERNIA REPAIR     ABDOMINAL HERNIA REPAIR WITH MESH - 3 SURGERIES    HX OF 19 SURGERIES FOR NECROTIC FASCITIS     SKIN GRAFTS+ WOUND  VAC   I&D OF INFECTED SITE IN [facility] - FROM AN INJECTION     IR FLUORO GUIDED NEEDLE PLC ASPIRATION/INJECTION LOC  06/03/2018   JOINT REPLACEMENT     TOTAL RIGHT KNEE REPLACEMENT   KNEE ARTHROSCOPY  RIGHT AND LEFT   X 2   LAPAROSCOPIC GASTRIC SLEEVE RESECTION  2014   NEPHROLITHOTOMY Right 12/10/2013   Procedure: NEPHROLITHOTOMY PERCUTANEOUS;  Surgeon: Lester Borden, MD;  Location: [facility];  Service: Urology;  Laterality: Right;   POSTERIOR LUMBAR FUSION 4 LEVEL N/A 07/28/2018   Procedure: Posterior Fixation Thoracic Ten-Lumbar Four with pedicle augmentaion with robotic assistance;  Surgeon: Elsner, Henry, MD;  Location: [facility];  Service: Neurosurgery;  Laterality: N/A;  Posterior Fixation Thoracic Ten-Lumbar Four with pedicle augmentaion with robotic assistance   REVERSE SHOULDER ARTHROPLASTY Right 07/21/2017   REVERSE SHOULDER ARTHROPLASTY Right 07/21/2017   Procedure: RIGHT REVERSE SHOULDER ARTHROPLASTY;  Surgeon: Chandler, Justin, MD;  Location: [facility];  Service: Orthopedics;  Laterality: Right;   RIGHT FOOT DRAINAGE OF INFECTION     shoulder arthroscopy Right    X 2   TONSILLECTOMY     AND ADENOIDECTOMY    Social History:  reports that she has never smoked. She has never used smokeless tobacco. She reports that she does not drink alcohol and does not use drugs.  Allergies:  Allergies  Allergen Reactions   Sumatriptan Other (See Comments) and Anaphylaxis    Vascular spasms  Other Reaction(s):  Other  Facial spasms, Vasospasms   Codeine Nausea And Vomiting    Other Reaction(s): GI Intolerance   Nsaids Other (See Comments)    PT UNABLE TO TOLERATE NSAID'S DRUGS DUE TO HX OF GASTRIC SLEEVE SURGERY   Statins Other (See Comments)    Leg/muscle pain   Sulfonamide Derivatives Hives    Childhood allergy    Hydrocodone-Acetaminophen Nausea And Vomiting    Other Reaction(s): GI Intolerance   Methimazole Hives   Niacin     heart pounding and beating fast.   Promethazine Hcl Other (See Comments)    Restless leg feeling all over body    Medications Prior to Admission  Medication Sig Dispense Refill     amLODipine (NORVASC) 5 MG tablet Take 5 mg by mouth at bedtime.     Cholecalciferol (DIALYVITE VITAMIN D 5000) 125 MCG (5000 UT) capsule Take 5,000 Units by mouth daily.     clonazePAM (KLONOPIN) 0.5 MG tablet Take 0.5 mg by mouth See admin instructions. Take 0.5 mg at bedtime, may take an additional 0.5 mg tablet twice daily as needed for anxiety  0   CREON 36000-114000 units CPEP capsule Take 72,000 Units by mouth 3 (three) times daily with meals. 2 capsules with each meal and 1 capsule with snacks     diclofenac (VOLTAREN) 75 MG EC tablet Take 75 mg by mouth 2 (two) times daily.     doxazosin (CARDURA) 2 MG tablet Take 2 mg by mouth daily.     fenofibrate 54 MG tablet Take 54 mg by mouth daily with supper.  2   FLUoxetine (PROZAC) 20 MG capsule Take 40 mg by mouth at bedtime.  3   furosemide (LASIX) 40 MG tablet Take 40 mg by mouth daily.     gabapentin (NEURONTIN) 300 MG capsule Take 1 capsule (300 mg total) by mouth 3 (three) times daily for 30 days. 90 capsule 0   insulin aspart (NOVOLOG) 100 UNIT/ML FlexPen Sliding scale dose: CBG < 70: implement hypoglycemia protocol  CBG 70 - 120: 0 units  CBG 121 - 150: 0 units  CBG 151 - 200: 0 units  CBG 201 - 250: 2 units  CBG 251 - 300: 3 units  CBG 301 - 350: 4 units  CBG 351 - 400: 5 units (Patient taking differently: Inject 20-25  Units into the skin 2 (two) times daily as needed for high blood sugar.) 15 mL 0   Insulin Glargine (BASAGLAR KWIKPEN) 100 UNIT/ML Inject 20 Units into the skin 2 (two) times daily.     Lactobacillus Acid-Pectin (ACIDOPHILUS/PECTIN) CAPS Take 1 capsule by mouth in the morning, at noon, and at bedtime.     magnesium oxide (MAG-OX) 400 (240 Mg) MG tablet Take 1 tablet by mouth daily.     metoprolol tartrate (LOPRESSOR) 50 MG tablet Take 50 mg by mouth 3 (three) times daily.     nutrition supplement, JUVEN, (JUVEN) PACK Take 1 packet by mouth 2 (two) times daily between meals.     omeprazole (PRILOSEC) 20 MG capsule Take 20 mg by mouth daily with supper.      oxybutynin (DITROPAN-XL) 10 MG 24 hr tablet Take 10 mg by mouth daily.     propylthiouracil (PTU) 50 MG tablet Take 50 mg by mouth daily.  0   rOPINIRole (REQUIP) 2 MG tablet Take 2 mg by mouth See admin instructions. Take 2 mg at bedtime, may take a second 2 mg dose as needed for restless legs     SYMBICORT 160-4.5 MCG/ACT inhaler Inhale 2 puffs into the lungs 2 (two) times daily.     tiZANidine (ZANAFLEX) 4 MG tablet Take 4 mg by mouth 2 (two) times daily as needed for muscle spasms.     VENTOLIN HFA 108 (90 Base) MCG/ACT inhaler Inhale 2 puffs into the lungs every 4 (four) hours as needed for shortness of breath.     BD INSULIN SYRINGE U/F 31G X 5/16" 0.5 ML MISC USE 5 TIMES A DAY LEVEMIR AND NOVOLOG     BD PEN NEEDLE MICRO U/F 32G X 6 MM MISC Inject 32 pens into the skin daily.     FREESTYLE TEST STRIPS test strip USE STRIPS TO TEST BLOOD GLUCOSE   BID     hyoscyamine (LEVSIN SL) 0.125 MG SL tablet Place 1 tablet (0.125 mg total) under the tongue every 6 (six) hours as needed. (Patient not taking: Reported on 07/22/2022) 30 tablet 2   Lancets Misc. (UNISTIK 3 COMFORT) MISC USE LANCETS TO CHECK BLOOD GLUCOSE BID     ondansetron (ZOFRAN) 4 MG tablet Take 1 tablet (4 mg total) by mouth every 8 (eight) hours as needed for nausea or vomiting. (Patient  not taking: Reported on 07/22/2022) 20 tablet 0   oxyCODONE-acetaminophen (PERCOCET/ROXICET) 5-325 MG tablet Take by mouth. (Patient not taking: Reported on 07/22/2022)      Blood pressure (!) 150/48, pulse 66, temperature 98.4 F (36.9 C), temperature source Oral, resp. rate 18, height 5' (1.524 m), weight 93.9 kg, SpO2 97 %. Physical Exam: General: pleasant, WD, obese female who is sitting in her chair in NAD HEENT: head is normocephalic, atraumatic.  Sclera are noninjected.  Pupils equal and round. EOMs intact.  Ears and nose without any masses or lesions.  Mouth is pink and moist Heart: regular, rate, and rhythm.  Normal s1,s2. No obvious murmurs, gallops, or rubs noted.  Lungs: CTAB, no wheezes, rhonchi, or rales noted.  Respiratory effort nonlabored Abd: soft, tender around wound, ND/obese, no masses, hernias.  She has a large LLQ and mons wound.  This is a combination of hard eschar and soft eschar.  No overt purulence in noted, but unable to see under 100% eschar.    Psych: A&Ox3 with an appropriate affect.    Results for orders placed or performed during the hospital encounter of 07/21/22 (from the past 48 hour(s))  Lactic acid, plasma     Status: None   Collection Time: 07/21/22 10:00 PM  Result Value Ref Range   Lactic Acid, Venous 1.8 0.5 - 1.9 mmol/L    Comment: Performed at [facility], [address] [place], [place] [address]  Comprehensive metabolic panel     Status: Abnormal   Collection Time: 07/21/22 10:00 PM  Result Value Ref Range   Sodium 134 (L) 135 - 145 mmol/L   Potassium 4.6 3.5 - 5.1 mmol/L   Chloride 98 98 - 111 mmol/L   CO2 26 22 - 32 mmol/L   Glucose, Bld 226 (H) 70 - 99 mg/dL    Comment: Glucose reference range applies only to samples taken after fasting for at least 8 hours.   BUN 71 (H) 8 - 23 mg/dL   Creatinine, Ser 1.53 (H) 0.44 - 1.00 mg/dL   Calcium 8.6 (L) 8.9 - 10.3 mg/dL   Total Protein 7.1 6.5 - 8.1 g/dL   Albumin 3.0 (L)  3.5 - 5.0 g/dL   AST 11 (L) 15 - 41 U/L   ALT 9 0 - 44 U/L   Alkaline Phosphatase 49 38 - 126 U/L   Total Bilirubin 0.5 0.3 - 1.2 mg/dL   GFR, Estimated 36 (L) >60 mL/min    Comment: (NOTE) Calculated using the CKD-EPI Creatinine Equation (2021)    Anion gap 10 5 - 15    Comment: Performed at [facility], [address] [place], [place] [address]  CBC with Differential     Status: Abnormal   Collection Time: 07/21/22 10:00 PM  Result Value Ref Range   WBC 1.9 (L) 4.0 - 10.5 K/uL   RBC 2.64 (L) 3.87 - 5.11 MIL/uL   Hemoglobin 7.2 (L) 12.0 - 15.0 g/dL   HCT 22.5 (L) 36.0 - 46.0 %     MCV 85.2 80.0 - 100.0 fL   MCH 27.3 26.0 - 34.0 pg   MCHC 32.0 30.0 - 36.0 g/dL   RDW 13.9 11.5 - 15.5 %   Platelets 115 (L) 150 - 400 K/uL    Comment: Immature Platelet Fraction may be clinically indicated, consider ordering this additional test [id number]    nRBC 0.0 0.0 - 0.2 %   Neutrophils Relative % 68 %   Neutro Abs 1.3 (L) 1.7 - 7.7 K/uL   Lymphocytes Relative 24 %   Lymphs Abs 0.5 (L) 0.7 - 4.0 K/uL   Monocytes Relative 8 %   Monocytes Absolute 0.2 0.1 - 1.0 K/uL   Eosinophils Relative 0 %   Eosinophils Absolute 0.0 0.0 - 0.5 K/uL   Basophils Relative 0 %   Basophils Absolute 0.0 0.0 - 0.1 K/uL   Immature Granulocytes 0 %   Abs Immature Granulocytes 0.00 0.00 - 0.07 K/uL    Comment: Performed at [facility], [address] [place], [place] [address]  Protime-INR     Status: None   Collection Time: 07/21/22 10:00 PM  Result Value Ref Range   Prothrombin Time 14.9 11.4 - 15.2 seconds   INR 1.2 0.8 - 1.2    Comment: (NOTE) INR goal varies based on device and disease states. Performed at [facility], [address] [place], [place] [address]   APTT     Status: Abnormal   Collection Time: 07/21/22 10:00 PM  Result Value Ref Range   aPTT 63 (H) 24 - 36 seconds    Comment:        IF BASELINE aPTT IS ELEVATED, SUGGEST  PATIENT RISK ASSESSMENT BE USED TO DETERMINE APPROPRIATE ANTICOAGULANT THERAPY. Performed at [facility], [address] [place], [place] [address]   Blood Culture (routine x 2)     Status: None (Preliminary result)   Collection Time: 07/21/22 10:00 PM   Specimen: BLOOD  Result Value Ref Range   Specimen Description      BLOOD SITE NOT SPECIFIED Performed at [facility], [address] [place], [place] [address]    Special Requests      BOTTLES DRAWN AEROBIC AND ANAEROBIC Blood Culture results may not be optimal due to an excessive volume of blood received in culture bottles Performed at [facility], [address] [place], [place] [address]    Culture      NO GROWTH < 12 HOURS Performed at [facility], [address] [place], [place] [address]    Report Status PENDING   Blood Culture (routine x 2)     Status: None (Preliminary result)   Collection Time: 07/21/22 10:08 PM   Specimen: BLOOD  Result Value Ref Range   Specimen Description      BLOOD SITE NOT SPECIFIED Performed at [facility], [address] [place], [place] [address]    Special Requests      BOTTLES DRAWN AEROBIC AND ANAEROBIC Blood Culture results may not be optimal due to an excessive volume of blood received in culture bottles Performed at  [facility], [address] [place], [place] [address]    Culture      NO GROWTH < 12 HOURS Performed at  [facility], [address] [place], [place] [address]    Report Status PENDING   I-stat chem 8, ED     Status: Abnormal   Collection Time: 07/21/22 10:16   PM  Result Value Ref Range   Sodium 137 135 - 145 mmol/L   Potassium 4.7 3.5 - 5.1 mmol/L   Chloride 99 98 - 111 mmol/L   BUN 77 (H) 8 - 23 mg/dL   Creatinine, Ser 1.60 (H) 0.44 - 1.00 mg/dL   Glucose, Bld 229 (H) 70 - 99 mg/dL    Comment: Glucose reference range applies only to samples taken  after fasting for at least 8 hours.   Calcium, Ion 1.18 1.15 - 1.40 mmol/L   TCO2 28 22 - 32 mmol/L   Hemoglobin 7.1 (L) 12.0 - 15.0 g/dL   HCT 21.0 (L) 36.0 - 46.0 %  Magnesium     Status: None   Collection Time: 07/21/22 10:19 PM  Result Value Ref Range   Magnesium 2.3 1.7 - 2.4 mg/dL    Comment: Performed at [facility], [address] [place], [place] [address]  Hemoglobin A1c     Status: Abnormal   Collection Time: 07/21/22 10:19 PM  Result Value Ref Range   Hgb A1c MFr Bld 6.8 (H) 4.8 - 5.6 %    Comment: (NOTE) Pre diabetes:          5.7%-6.4%  Diabetes:              >6.4%  Glycemic control for   <7.0% adults with diabetes    Mean Plasma Glucose 148.46 mg/dL    Comment: Performed at [facility], [address] [place], [place] [address]  Urinalysis, w/ Reflex to Culture (Infection Suspected) -Urine, Clean Catch     Status: Abnormal   Collection Time: 07/21/22 10:25 PM  Result Value Ref Range   Specimen Source URINE, CLEAN CATCH    Color, Urine STRAW (A) YELLOW   APPearance CLEAR CLEAR   Specific Gravity, Urine 1.012 1.005 - 1.030   pH 6.0 5.0 - 8.0   Glucose, UA 50 (A) NEGATIVE mg/dL   Hgb urine dipstick NEGATIVE NEGATIVE   Bilirubin Urine NEGATIVE NEGATIVE   Ketones, ur NEGATIVE NEGATIVE mg/dL   Protein, ur NEGATIVE NEGATIVE mg/dL   Nitrite NEGATIVE NEGATIVE   Leukocytes,Ua TRACE (A) NEGATIVE   RBC / HPF 0-5 0 - 5 RBC/hpf   WBC, UA 0-5 0 - 5 WBC/hpf    Comment:        Reflex urine culture not performed if WBC <=10, OR if Squamous epithelial cells >5. If Squamous epithelial cells >5 suggest recollection.    Bacteria, UA NONE SEEN NONE SEEN   Squamous Epithelial / HPF 0-5 0 - 5 /HPF    Comment: Performed at [facility], [address] [place], [place] [address]  Sodium, urine, random     Status: None   Collection Time: 07/21/22 10:25 PM  Result Value Ref Range   Sodium, Ur 63 mmol/L    Comment: Performed at  [facility], [address] [place], [place] [address]  Creatinine, urine, random     Status: None   Collection Time: 07/21/22 10:25 PM  Result Value Ref Range   Creatinine, Urine 40 mg/dL    Comment: Performed at [facility], [address] [place],  [address]  Glucose, capillary     Status: Abnormal   Collection Time: 07/22/22  2:01 AM  Result Value Ref Range   Glucose-Capillary 180 (H) 70 - 99 mg/dL    Comment: Glucose reference range applies only to samples taken after fasting for at least 8 hours.    Glucose, capillary     Status: Abnormal   Collection Time: 07/22/22  5:05 AM  Result Value Ref Range   Glucose-Capillary 192 (H) 70 - 99 mg/dL    Comment: Glucose reference range applies only to samples taken after fasting for at least 8 hours.  Type and screen [facility]     Status: None   Collection Time: 07/22/22  5:08 AM  Result Value Ref Range   ABO/RH(D) A NEG    Antibody Screen NEG    Sample Expiration      07/25/2022,2359 Performed at [facility], [address] [place], [place] [address]   CBC with Differential/Platelet     Status: Abnormal   Collection Time: 07/22/22  5:14 AM  Result Value Ref Range   WBC 1.6 (L) 4.0 - 10.5 K/uL   RBC 2.76 (L) 3.87 - 5.11 MIL/uL   Hemoglobin 7.2 (L) 12.0 - 15.0 g/dL   HCT 23.6 (L) 36.0 - 46.0 %   MCV 85.5 80.0 - 100.0 fL   MCH 26.1 26.0 - 34.0 pg   MCHC 30.5 30.0 - 36.0 g/dL   RDW 14.0 11.5 - 15.5 %   Platelets 104 (L) 150 - 400 K/uL    Comment: Immature Platelet Fraction may be clinically indicated, consider ordering this additional test [id number]    nRBC 0.0 0.0 - 0.2 %   Neutrophils Relative % 60 %   Neutro Abs 1.0 (L) 1.7 - 7.7 K/uL   Lymphocytes Relative 29 %   Lymphs Abs 0.5 (L) 0.7 - 4.0 K/uL   Monocytes Relative 10 %   Monocytes Absolute 0.2 0.1 - 1.0 K/uL   Eosinophils Relative 0 %   Eosinophils Absolute 0.0 0.0 - 0.5 K/uL    Basophils Relative 0 %   Basophils Absolute 0.0 0.0 - 0.1 K/uL   Immature Granulocytes 1 %   Abs Immature Granulocytes 0.01 0.00 - 0.07 K/uL    Comment: Performed at [facility], [address] [place], [place] [address]  Comprehensive metabolic panel     Status: Abnormal   Collection Time: 07/22/22  5:14 AM  Result Value Ref Range   Sodium 136 135 - 145 mmol/L   Potassium 4.7 3.5 - 5.1 mmol/L   Chloride 100 98 - 111 mmol/L   CO2 28 22 - 32 mmol/L   Glucose, Bld 209 (H) 70 - 99 mg/dL    Comment: Glucose reference range applies only to samples taken after fasting for at least 8 hours.   BUN 59 (H) 8 - 23 mg/dL   Creatinine, Ser 1.30 (H) 0.44 - 1.00 mg/dL   Calcium 8.7 (L) 8.9 - 10.3 mg/dL   Total Protein 6.9 6.5 - 8.1 g/dL   Albumin 2.9 (L) 3.5 - 5.0 g/dL   AST 10 (L) 15 - 41 U/L   ALT 8 0 - 44 U/L   Alkaline Phosphatase 47 38 - 126 U/L   Total Bilirubin 0.6 0.3 - 1.2 mg/dL   GFR, Estimated 44 (L) >60 mL/min    Comment: (NOTE) Calculated using the CKD-EPI Creatinine Equation (2021)    Anion gap 8 5 - 15    Comment: Performed at [facility], [address] [place], [place] [address]  Magnesium     Status: None   Collection Time: 07/22/22  5:14 AM  Result Value Ref Range   Magnesium 2.4 1.7 - 2.4 mg/dL    Comment: Performed at [facility],   [address] [place], [place] [address]  Ferritin     Status: None   Collection Time: 07/22/22  5:14 AM  Result Value Ref Range   Ferritin 100 11 - 307 ng/mL    Comment: Performed at [facility], [address] [place], [place] [address]  Iron and TIBC     Status: Abnormal   Collection Time: 07/22/22  5:14 AM  Result Value Ref Range   Iron 122 28 - 170 ug/dL   TIBC 356 250 - 450 ug/dL   Saturation Ratios 34 (H) 10.4 - 31.8 %   UIBC 234 ug/dL    Comment: Performed at [facility], [address] [place], [place] [address]   Reticulocytes     Status: Abnormal   Collection Time: 07/22/22  5:14 AM  Result Value Ref Range   Retic Ct Pct 0.5 0.4 - 3.1 %   RBC. 2.69 (L) 3.87 - 5.11 MIL/uL   Retic Count, Absolute 13.5 (L) 19.0 - 186.0 K/uL   Immature Retic Fract 2.2 (L) 2.3 - 15.9 %    Comment: Performed at [facility], [address] [place], [place] [address]  Glucose, capillary     Status: Abnormal   Collection Time: 07/22/22  7:23 AM  Result Value Ref Range   Glucose-Capillary 139 (H) 70 - 99 mg/dL    Comment: Glucose reference range applies only to samples taken after fasting for at least 8 hours.  Glucose, capillary     Status: Abnormal   Collection Time: 07/22/22 11:36 AM  Result Value Ref Range   Glucose-Capillary 217 (H) 70 - 99 mg/dL    Comment: Glucose reference range applies only to samples taken after fasting for at least 8 hours.   CT ABDOMEN PELVIS W CONTRAST  Result Date: 07/21/2022 CLINICAL DATA:  Abdominal wall wound. Evaluate for deep space infection. EXAM: CT ABDOMEN AND PELVIS WITH CONTRAST TECHNIQUE: Multidetector CT imaging of the abdomen and pelvis was performed using the standard protocol following bolus administration of intravenous contrast. RADIATION DOSE REDUCTION: This exam was performed according to the departmental dose-optimization program which includes automated exposure control, adjustment of the mA and/or kV according to patient size and/or use of iterative reconstruction technique. CONTRAST:  75mL OMNIPAQUE IOHEXOL 300 MG/ML  SOLN COMPARISON:  CT abdomen and pelvis 10/06/2021 FINDINGS: Lower chest: No acute abnormality. Hepatobiliary: The liver is enlarged, unchanged. The gallbladder is surgically absent. There is no biliary ductal dilatation. Pancreas: Rounded 14 mm low-attenuation lesion in the head of the pancreas has slightly decreased in size. Pancreas is otherwise within normal limits. Spleen: Moderately enlarged, unchanged. Adrenals/Urinary Tract: There  are punctate nonobstructing bilateral renal calculi. There is no hydronephrosis or perinephric stranding. The adrenal glands and bladder are within normal limits. Stomach/Bowel: No evidence of bowel wall thickening, distention, or inflammatory changes. The appendix is not seen. There is a large amount of stool throughout the colon. There surgical changes in the stomach. Vascular/Lymphatic: Aortic atherosclerosis. No enlarged abdominal or pelvic lymph nodes. Reproductive: Uterus is smaller absent.  Adnexa are unremarkable. Other: There is skin thickening and subcutaneous stranding of the anterior inferior abdominal wall. There is no evidence for soft tissue gas or abscess. There is a small inferior left ventral hernia containing nondilated bowel, unchanged. There is no ascites. Musculoskeletal: Thoracolumbar fusion hardware is present. IMPRESSION: 1. Findings compatible with cellulitis of the anterior inferior abdominal wall. No evidence for soft tissue gas or abscess. 2. Stable hepatosplenomegaly. 3.   Nonobstructing bilateral renal calculi. 4. Stable small left ventral hernia containing nondilated bowel. 5. 14 mm cystic lesion in the head of the pancreas has slightly decreased in size. Recommend follow-up MRI in 1 year. Aortic Atherosclerosis (ICD10-I70.0). Electronically Signed   By: Amy  Guttmann M.D.   On: 07/21/2022 23:02   DG Chest Port 1 View  Result Date: 07/21/2022 CLINICAL DATA:  Questionable sepsis. EXAM: PORTABLE CHEST 1 VIEW COMPARISON:  Portable chest 03/10/2022 FINDINGS: Mild cardiomegaly without evidence of CHF. The mediastinum is normally outlined. Minimal calcification of the transverse aorta. There are scattered linear scar-like opacities in the lower lung fields. No focal pneumonia is evident or pleural effusion. There are no acute osseous findings. There is partially visible lower thoracic/lumbar fusion hardware and a reverse right shoulder arthroplasty with left shoulder DJD and chronic  rotator cuff arthropathy. Degenerative change thoracic spine. IMPRESSION: 1. Cardiomegaly without evidence of CHF or pneumonia. 2. Aortic atherosclerosis. 3. Chronic changes as above. Electronically Signed   By: Keith  Chesser M.D.   On: 07/21/2022 21:57      Assessment/Plan Abdominal wall soft tissue infection H/o of necrotizing fasciitis s/p debridements and grafting (2018-2019)  Patient seen and examined and relevant labs and imaging personally reviewed. I have reviewed notes and labs from her recent hospital admission as well. Work up at OSH included wound culture growing MSSA, rare Enterococcus hirae, and rare Streptococcus pyogenes (group A). She completed linezolid course. CT 5/1 with findings of cellulitis but no soft tissue gas or abscess  Abdominal wound continues to have complete coverage of eschar.  This will require debridement in the operating room to get this cleaned up.  After debridement, hopefully she can progress to a wound VAC if able to assist with healing.  The procedure was explained to the patient and she understands and is agreeable to proceed to the OR tomorrow  FEN: regular, NPO p MN ID: Vanc/Rocephin VTE: okay for chemical prophylaxis from surgical standpoint  Dispo: OR tomorrow  DM CHF Pancytopenia HLD HTN Hypothyroidism Obesity OSA Fibromyalgia   I reviewed hospitalist notes, last 24 h vitals and pain scores, last 48 h intake and output, last 24 h labs and trends, and last 24 h imaging results.   Cortez Flippen E Tamanna Whitson, PA-C [facility] 07/22/2022, 12:54 PM Please see Amion for pager number during day hours 7:00am-4:30pm   

## 2022-07-22 NOTE — Consult Note (Signed)
Consult Note  [name] Apr 18, 1950  [id number].    Requesting MD: Dr. Nelson Chimes Chief Complaint/Reason for Consult: abdominal wound  HPI:  72 y.o. female with medical history significant for T2DM, CHF, hyperlipidemia, fibromyalgia, HTN, hypothyroidism, OSA, pancytopenia followed by Dr. Leonides Schanz, multiple prior abdominal surgeries related to soft tissue infections who presented to [facility] ED 5/1 with worsening abdominal wall infection that began earlier last month. She was admitted to [facility] for abdominal wall cellulitis with septic shock - 4/17-4/24. Initially wound was thought to be related to shingles. Wound culture was positive for MSSA, rare Enterococcus hirae, and rare Streptococcus pyogenes (group A). She was discharged on linezolid which she completed. She denies fever and chills.  She has history of abdominal wall necrotizing fasciitis requiring multiple debridements secondary to infection after hernia repair at [facility] in 2018. Last surgery was 03/2017 for skin grafting.  Substance use: none Blood thinners: none Past Surgeries: abdominal hysterectomy, appendectomy, c section x3, cholecystectomy, laparoscopic gastric sleeve (2014), incisional hernia repair, s/p multiple abdominal wall debridements and skin grafts.   ROS: Reviewed and as above  Family History  Problem Relation Age of Onset   Stroke Mother    Bladder Cancer Mother    Diabetes Father    Osteoarthritis Father    Heart disease Father    Ulcers Father    Suicidality Sister    Colon cancer Neg Hx    Stomach cancer Neg Hx    Rectal cancer Neg Hx    Esophageal cancer Neg Hx    Pancreatic cancer Neg Hx     Past Medical History:  Diagnosis Date   Anemia    Arthritis    Asthma    OCCAS   Bell's palsy    Broken neck (HCC)    C-1   Carbuncle and furuncle of trunk    Chronic dislocation of right shoulder    Depression    Disorder of fascia    HX OF NECROTIC FASCITIS AFTER  ABDOMINAL SURGERY FOR HERNIA- REQUIRED 19 SURGERIES AND 2.5 MONTH HOSPITALIZATION AT [facility]   DM (diabetes mellitus) (HCC)    Dysrhythmia    HX OF TACHYCARDIA AND BRADYCARDIA - ON GOING FOR YEARS - DOES NOT HAVE TO SEE CARDIOLOGIST   Elevated cholesterol    Fibromyalgia    Fracture MAY 2014   HX OF FRACTURED NECK C1- CAUSES SEVERE HEADACHES--LIMITED ROM NECK   Frequent infections    ESPECIALLY PRONE TO INFECTIONS AFTER SURGERIES   Graves disease    Heart murmur    DOES NOT CAUSE ANY PROBLEMS   History of kidney stones    Hyperlipidemia    Hypertension    Hypothyroidism    GRAVES DISEASE   Migraine    Nephrolithiasis    STAGE 3    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   Neuropathy    Obesity    OSA (obstructive sleep apnea)    USES CPAP - DOES NOT KNOW SETTING   Pain    LEFT SHOULDER  PAIN -HARD TO LIE ON LEFT SIDE FOR LONG PERIOD;  PAIN IN LOWER BADK - 3 HERNIATED DISCS AND STENOISIS -   PONV (postoperative nausea and vomiting)    THE GAS MAKES ME NAUSEATED   Restless leg syndrome    Sciatica    Tachycardia    Urinary frequency    Urticaria    UTI (urinary tract infection)     Past Surgical History:  Procedure Laterality  Date   ABDOMINAL HYSTERECTOMY     LARGE TUMOR AT OVARY REMOVED   ANTERIOR LAT LUMBAR FUSION N/A 07/28/2018   Procedure: Anterolateral Decompression Lumbar One-Two for osteomyelitis reconstruction w/titanium strut allograft Fusion Lumbar One-Two;  Surgeon: Barnett Abu, MD;  Location: [facility] OR;  Service: Neurosurgery;  Laterality: N/A;  Left anterolateral approach   APPENDECTOMY     APPLICATION OF ROBOTIC ASSISTANCE FOR SPINAL PROCEDURE N/A 07/28/2018   Procedure: APPLICATION OF ROBOTIC ASSISTANCE FOR SPINAL PROCEDURE;  Surgeon: Barnett Abu, MD;  Location: [facility] OR;  Service: Neurosurgery;  Laterality: N/A;   CARPAL TUNNEL RELEASE     BILATERAL   CATARACT EXTRACTION W/ INTRAOCULAR LENS  IMPLANT, BILATERAL     CESAREAN SECTION     X 3   CHOLECYSTECTOMY      CYSTOSCOPY WITH URETEROSCOPY Right 12/03/2013   Procedure: CYSTOSCOPY WITH RIGHT RETROGRADE URETEROSCOPY LASER LITHOTRIPSY RIGHT STONE RIGHT URETERAL STENT, ;  Surgeon: Heloise Purpura, MD;  Location: [facility] ORS;  Service: Urology;  Laterality: Right;  PROCEDURE WAS ORIGINALLY SCHEDULED AS RIGHT PERCUTANEOUS NEPHROLITHOTOMY   EYELID LACERATION REPAIR     RIGHT EYE   HERNIA REPAIR     ABDOMINAL HERNIA REPAIR WITH MESH - 3 SURGERIES    HX OF 19 SURGERIES FOR NECROTIC FASCITIS     SKIN GRAFTS+ WOUND  VAC   I&D OF INFECTED SITE IN [facility] - FROM AN INJECTION     IR FLUORO GUIDED NEEDLE PLC ASPIRATION/INJECTION LOC  06/03/2018   JOINT REPLACEMENT     TOTAL RIGHT KNEE REPLACEMENT   KNEE ARTHROSCOPY  RIGHT AND LEFT   X 2   LAPAROSCOPIC GASTRIC SLEEVE RESECTION  2014   NEPHROLITHOTOMY Right 12/10/2013   Procedure: NEPHROLITHOTOMY PERCUTANEOUS;  Surgeon: Heloise Purpura, MD;  Location: [facility];  Service: Urology;  Laterality: Right;   POSTERIOR LUMBAR FUSION 4 LEVEL N/A 07/28/2018   Procedure: Posterior Fixation Thoracic Ten-Lumbar Four with pedicle augmentaion with robotic assistance;  Surgeon: Barnett Abu, MD;  Location: [facility] OR;  Service: Neurosurgery;  Laterality: N/A;  Posterior Fixation Thoracic Ten-Lumbar Four with pedicle augmentaion with robotic assistance   REVERSE SHOULDER ARTHROPLASTY Right 07/21/2017   REVERSE SHOULDER ARTHROPLASTY Right 07/21/2017   Procedure: RIGHT REVERSE SHOULDER ARTHROPLASTY;  Surgeon: Jones Broom, MD;  Location: [facility] OR;  Service: Orthopedics;  Laterality: Right;   RIGHT FOOT DRAINAGE OF INFECTION     shoulder arthroscopy Right    X 2   TONSILLECTOMY     AND ADENOIDECTOMY    Social History:  reports that she has never smoked. She has never used smokeless tobacco. She reports that she does not drink alcohol and does not use drugs.  Allergies:  Allergies  Allergen Reactions   Sumatriptan Other (See Comments) and Anaphylaxis    Vascular spasms  Other Reaction(s):  Other  Facial spasms, Vasospasms   Codeine Nausea And Vomiting    Other Reaction(s): GI Intolerance   Nsaids Other (See Comments)    PT UNABLE TO TOLERATE NSAID'S DRUGS DUE TO HX OF GASTRIC SLEEVE SURGERY   Statins Other (See Comments)    Leg/muscle pain   Sulfonamide Derivatives Hives    Childhood allergy    Hydrocodone-Acetaminophen Nausea And Vomiting    Other Reaction(s): GI Intolerance   Methimazole Hives   Niacin     heart pounding and beating fast.   Promethazine Hcl Other (See Comments)    Restless leg feeling all over body    Medications Prior to Admission  Medication Sig Dispense Refill  amLODipine (NORVASC) 5 MG tablet Take 5 mg by mouth at bedtime.     Cholecalciferol (DIALYVITE VITAMIN D 5000) 125 MCG (5000 UT) capsule Take 5,000 Units by mouth daily.     clonazePAM (KLONOPIN) 0.5 MG tablet Take 0.5 mg by mouth See admin instructions. Take 0.5 mg at bedtime, may take an additional 0.5 mg tablet twice daily as needed for anxiety  0   CREON 36000-114000 units CPEP capsule Take 72,000 Units by mouth 3 (three) times daily with meals. 2 capsules with each meal and 1 capsule with snacks     diclofenac (VOLTAREN) 75 MG EC tablet Take 75 mg by mouth 2 (two) times daily.     doxazosin (CARDURA) 2 MG tablet Take 2 mg by mouth daily.     fenofibrate 54 MG tablet Take 54 mg by mouth daily with supper.  2   FLUoxetine (PROZAC) 20 MG capsule Take 40 mg by mouth at bedtime.  3   furosemide (LASIX) 40 MG tablet Take 40 mg by mouth daily.     gabapentin (NEURONTIN) 300 MG capsule Take 1 capsule (300 mg total) by mouth 3 (three) times daily for 30 days. 90 capsule 0   insulin aspart (NOVOLOG) 100 UNIT/ML FlexPen Sliding scale dose: CBG < 70: implement hypoglycemia protocol  CBG 70 - 120: 0 units  CBG 121 - 150: 0 units  CBG 151 - 200: 0 units  CBG 201 - 250: 2 units  CBG 251 - 300: 3 units  CBG 301 - 350: 4 units  CBG 351 - 400: 5 units (Patient taking differently: Inject 20-25  Units into the skin 2 (two) times daily as needed for high blood sugar.) 15 mL 0   Insulin Glargine (BASAGLAR KWIKPEN) 100 UNIT/ML Inject 20 Units into the skin 2 (two) times daily.     Lactobacillus Acid-Pectin (ACIDOPHILUS/PECTIN) CAPS Take 1 capsule by mouth in the morning, at noon, and at bedtime.     magnesium oxide (MAG-OX) 400 (240 Mg) MG tablet Take 1 tablet by mouth daily.     metoprolol tartrate (LOPRESSOR) 50 MG tablet Take 50 mg by mouth 3 (three) times daily.     nutrition supplement, JUVEN, (JUVEN) PACK Take 1 packet by mouth 2 (two) times daily between meals.     omeprazole (PRILOSEC) 20 MG capsule Take 20 mg by mouth daily with supper.      oxybutynin (DITROPAN-XL) 10 MG 24 hr tablet Take 10 mg by mouth daily.     propylthiouracil (PTU) 50 MG tablet Take 50 mg by mouth daily.  0   rOPINIRole (REQUIP) 2 MG tablet Take 2 mg by mouth See admin instructions. Take 2 mg at bedtime, may take a second 2 mg dose as needed for restless legs     SYMBICORT 160-4.5 MCG/ACT inhaler Inhale 2 puffs into the lungs 2 (two) times daily.     tiZANidine (ZANAFLEX) 4 MG tablet Take 4 mg by mouth 2 (two) times daily as needed for muscle spasms.     VENTOLIN HFA 108 (90 Base) MCG/ACT inhaler Inhale 2 puffs into the lungs every 4 (four) hours as needed for shortness of breath.     BD INSULIN SYRINGE U/F 31G X 5/16" 0.5 ML MISC USE 5 TIMES A DAY LEVEMIR AND NOVOLOG     BD PEN NEEDLE MICRO U/F 32G X 6 MM MISC Inject 32 pens into the skin daily.     FREESTYLE TEST STRIPS test strip USE STRIPS TO TEST BLOOD GLUCOSE  BID     hyoscyamine (LEVSIN SL) 0.125 MG SL tablet Place 1 tablet (0.125 mg total) under the tongue every 6 (six) hours as needed. (Patient not taking: Reported on 07/22/2022) 30 tablet 2   Lancets Misc. (UNISTIK 3 COMFORT) MISC USE LANCETS TO CHECK BLOOD GLUCOSE BID     ondansetron (ZOFRAN) 4 MG tablet Take 1 tablet (4 mg total) by mouth every 8 (eight) hours as needed for nausea or vomiting. (Patient  not taking: Reported on 07/22/2022) 20 tablet 0   oxyCODONE-acetaminophen (PERCOCET/ROXICET) 5-325 MG tablet Take by mouth. (Patient not taking: Reported on 07/22/2022)      Blood pressure (!) 150/48, pulse 66, temperature 98.4 F (36.9 C), temperature source Oral, resp. rate 18, height 5' (1.524 m), weight 93.9 kg, SpO2 97 %. Physical Exam: General: pleasant, WD, obese female who is sitting in her chair in NAD HEENT: head is normocephalic, atraumatic.  Sclera are noninjected.  Pupils equal and round. EOMs intact.  Ears and nose without any masses or lesions.  Mouth is pink and moist Heart: regular, rate, and rhythm.  Normal s1,s2. No obvious murmurs, gallops, or rubs noted.  Lungs: CTAB, no wheezes, rhonchi, or rales noted.  Respiratory effort nonlabored Abd: soft, tender around wound, ND/obese, no masses, hernias.  She has a large LLQ and mons wound.  This is a combination of hard eschar and soft eschar.  No overt purulence in noted, but unable to see under 100% eschar.    Psych: A&Ox3 with an appropriate affect.    Results for orders placed or performed during the hospital encounter of 07/21/22 (from the past 48 hour(s))  Lactic acid, plasma     Status: None   Collection Time: 07/21/22 10:00 PM  Result Value Ref Range   Lactic Acid, Venous 1.8 0.5 - 1.9 mmol/L    Comment: Performed at [facility], [address] [place], [place] [address]  Comprehensive metabolic panel     Status: Abnormal   Collection Time: 07/21/22 10:00 PM  Result Value Ref Range   Sodium 134 (L) 135 - 145 mmol/L   Potassium 4.6 3.5 - 5.1 mmol/L   Chloride 98 98 - 111 mmol/L   CO2 26 22 - 32 mmol/L   Glucose, Bld 226 (H) 70 - 99 mg/dL    Comment: Glucose reference range applies only to samples taken after fasting for at least 8 hours.   BUN 71 (H) 8 - 23 mg/dL   Creatinine, Ser 8.46 (H) 0.44 - 1.00 mg/dL   Calcium 8.6 (L) 8.9 - 10.3 mg/dL   Total Protein 7.1 6.5 - 8.1 g/dL   Albumin 3.0 (L)  3.5 - 5.0 g/dL   AST 11 (L) 15 - 41 U/L   ALT 9 0 - 44 U/L   Alkaline Phosphatase 49 38 - 126 U/L   Total Bilirubin 0.5 0.3 - 1.2 mg/dL   GFR, Estimated 36 (L) >60 mL/min    Comment: (NOTE) Calculated using the CKD-EPI Creatinine Equation (2021)    Anion gap 10 5 - 15    Comment: Performed at [facility], [address], [place], [place] [address]  CBC with Differential     Status: Abnormal   Collection Time: 07/21/22 10:00 PM  Result Value Ref Range   WBC 1.9 (L) 4.0 - 10.5 K/uL   RBC 2.64 (L) 3.87 - 5.11 MIL/uL   Hemoglobin 7.2 (L) 12.0 - 15.0 g/dL   HCT 28.4 (L) 13.2 - 44.0 %  MCV 85.2 80.0 - 100.0 fL   MCH 27.3 26.0 - 34.0 pg   MCHC 32.0 30.0 - 36.0 g/dL   RDW 16.1 09.6 - 04.5 %   Platelets 115 (L) 150 - 400 K/uL    Comment: Immature Platelet Fraction may be clinically indicated, consider ordering this additional test [id number]    nRBC 0.0 0.0 - 0.2 %   Neutrophils Relative % 68 %   Neutro Abs 1.3 (L) 1.7 - 7.7 K/uL   Lymphocytes Relative 24 %   Lymphs Abs 0.5 (L) 0.7 - 4.0 K/uL   Monocytes Relative 8 %   Monocytes Absolute 0.2 0.1 - 1.0 K/uL   Eosinophils Relative 0 %   Eosinophils Absolute 0.0 0.0 - 0.5 K/uL   Basophils Relative 0 %   Basophils Absolute 0.0 0.0 - 0.1 K/uL   Immature Granulocytes 0 %   Abs Immature Granulocytes 0.00 0.00 - 0.07 K/uL    Comment: Performed at [facility], [address], [place], [place] [address]  Protime-INR     Status: None   Collection Time: 07/21/22 10:00 PM  Result Value Ref Range   Prothrombin Time 14.9 11.4 - 15.2 seconds   INR 1.2 0.8 - 1.2    Comment: (NOTE) INR goal varies based on device and disease states. Performed at [facility], [address] [place], [place] [address]   APTT     Status: Abnormal   Collection Time: 07/21/22 10:00 PM  Result Value Ref Range   aPTT 63 (H) 24 - 36 seconds    Comment:        IF BASELINE aPTT IS ELEVATED, SUGGEST  PATIENT RISK ASSESSMENT BE USED TO DETERMINE APPROPRIATE ANTICOAGULANT THERAPY. Performed at [facility], [address] [place], [place] [address]   Blood Culture (routine x 2)     Status: None (Preliminary result)   Collection Time: 07/21/22 10:00 PM   Specimen: BLOOD  Result Value Ref Range   Specimen Description      BLOOD SITE NOT SPECIFIED Performed at [facility], [address], [place], [place] [address]    Special Requests      BOTTLES DRAWN AEROBIC AND ANAEROBIC Blood Culture results may not be optimal due to an excessive volume of blood received in culture bottles Performed at [facility], [address], [place], [place] [address]    Culture      NO GROWTH < 12 HOURS Performed at [facility], [address] [place], [place] [address]    Report Status PENDING   Blood Culture (routine x 2)     Status: None (Preliminary result)   Collection Time: 07/21/22 10:08 PM   Specimen: BLOOD  Result Value Ref Range   Specimen Description      BLOOD SITE NOT SPECIFIED Performed at [facility], [address], [place], [place] [address]    Special Requests      BOTTLES DRAWN AEROBIC AND ANAEROBIC Blood Culture results may not be optimal due to an excessive volume of blood received in culture bottles Performed at [facility], [address], [place], [place] [address]    Culture      NO GROWTH < 12 HOURS Performed at [facility], [address] [place], [place] [address]    Report Status PENDING   I-stat chem 8, ED     Status: Abnormal   Collection Time: 07/21/22 10:16  PM  Result Value Ref Range   Sodium 137 135 - 145 mmol/L   Potassium 4.7 3.5 - 5.1 mmol/L   Chloride 99 98 - 111 mmol/L   BUN 77 (H) 8 - 23 mg/dL   Creatinine, Ser 1.61 (H) 0.44 - 1.00 mg/dL   Glucose, Bld 096 (H) 70 - 99 mg/dL    Comment: Glucose reference range applies only to samples taken  after fasting for at least 8 hours.   Calcium, Ion 1.18 1.15 - 1.40 mmol/L   TCO2 28 22 - 32 mmol/L   Hemoglobin 7.1 (L) 12.0 - 15.0 g/dL   HCT 04.5 (L) 40.9 - 81.1 %  Magnesium     Status: None   Collection Time: 07/21/22 10:19 PM  Result Value Ref Range   Magnesium 2.3 1.7 - 2.4 mg/dL    Comment: Performed at [facility], [address], [place], [place] [address]  Hemoglobin A1c     Status: Abnormal   Collection Time: 07/21/22 10:19 PM  Result Value Ref Range   Hgb A1c MFr Bld 6.8 (H) 4.8 - 5.6 %    Comment: (NOTE) Pre diabetes:          5.7%-6.4%  Diabetes:              >6.4%  Glycemic control for   <7.0% adults with diabetes    Mean Plasma Glucose 148.46 mg/dL    Comment: Performed at [facility], [address] [place], [place] [address]  Urinalysis, w/ Reflex to Culture (Infection Suspected) -Urine, Clean Catch     Status: Abnormal   Collection Time: 07/21/22 10:25 PM  Result Value Ref Range   Specimen Source URINE, CLEAN CATCH    Color, Urine STRAW (A) YELLOW   APPearance CLEAR CLEAR   Specific Gravity, Urine 1.012 1.005 - 1.030   pH 6.0 5.0 - 8.0   Glucose, UA 50 (A) NEGATIVE mg/dL   Hgb urine dipstick NEGATIVE NEGATIVE   Bilirubin Urine NEGATIVE NEGATIVE   Ketones, ur NEGATIVE NEGATIVE mg/dL   Protein, ur NEGATIVE NEGATIVE mg/dL   Nitrite NEGATIVE NEGATIVE   Leukocytes,Ua TRACE (A) NEGATIVE   RBC / HPF 0-5 0 - 5 RBC/hpf   WBC, UA 0-5 0 - 5 WBC/hpf    Comment:        Reflex urine culture not performed if WBC <=10, OR if Squamous epithelial cells >5. If Squamous epithelial cells >5 suggest recollection.    Bacteria, UA NONE SEEN NONE SEEN   Squamous Epithelial / HPF 0-5 0 - 5 /HPF    Comment: Performed at [facility], [address] [place], [place] [address]  Sodium, urine, random     Status: None   Collection Time: 07/21/22 10:25 PM  Result Value Ref Range   Sodium, Ur 63 mmol/L    Comment: Performed at  [facility], [address], [place], [place] [address]  Creatinine, urine, random     Status: None   Collection Time: 07/21/22 10:25 PM  Result Value Ref Range   Creatinine, Urine 40 mg/dL    Comment: Performed at [facility], [address] [place], [place] [address]  Glucose, capillary     Status: Abnormal   Collection Time: 07/22/22  2:01 AM  Result Value Ref Range   Glucose-Capillary 180 (H) 70 - 99 mg/dL    Comment: Glucose reference range applies only to samples taken after fasting for at least 8 hours.  Glucose, capillary     Status: Abnormal   Collection Time: 07/22/22  5:05 AM  Result Value Ref Range   Glucose-Capillary 192 (H) 70 - 99 mg/dL    Comment: Glucose reference range applies only to samples taken after fasting for at least 8 hours.  Type and screen [facility]     Status: None   Collection Time: 07/22/22  5:08 AM  Result Value Ref Range   ABO/RH(D) A NEG    Antibody Screen NEG    Sample Expiration      07/25/2022,2359 Performed at [facility], [address], [place], [place] [address]   CBC with Differential/Platelet     Status: Abnormal   Collection Time: 07/22/22  5:14 AM  Result Value Ref Range   WBC 1.6 (L) 4.0 - 10.5 K/uL   RBC 2.76 (L) 3.87 - 5.11 MIL/uL   Hemoglobin 7.2 (L) 12.0 - 15.0 g/dL   HCT 60.4 (L) 54.0 - 98.1 %   MCV 85.5 80.0 - 100.0 fL   MCH 26.1 26.0 - 34.0 pg   MCHC 30.5 30.0 - 36.0 g/dL   RDW 19.1 47.8 - 29.5 %   Platelets 104 (L) 150 - 400 K/uL    Comment: Immature Platelet Fraction may be clinically indicated, consider ordering this additional test [id number]    nRBC 0.0 0.0 - 0.2 %   Neutrophils Relative % 60 %   Neutro Abs 1.0 (L) 1.7 - 7.7 K/uL   Lymphocytes Relative 29 %   Lymphs Abs 0.5 (L) 0.7 - 4.0 K/uL   Monocytes Relative 10 %   Monocytes Absolute 0.2 0.1 - 1.0 K/uL   Eosinophils Relative 0 %   Eosinophils Absolute 0.0 0.0 - 0.5 K/uL    Basophils Relative 0 %   Basophils Absolute 0.0 0.0 - 0.1 K/uL   Immature Granulocytes 1 %   Abs Immature Granulocytes 0.01 0.00 - 0.07 K/uL    Comment: Performed at [facility], [address] [place], [place] [address]  Comprehensive metabolic panel     Status: Abnormal   Collection Time: 07/22/22  5:14 AM  Result Value Ref Range   Sodium 136 135 - 145 mmol/L   Potassium 4.7 3.5 - 5.1 mmol/L   Chloride 100 98 - 111 mmol/L   CO2 28 22 - 32 mmol/L   Glucose, Bld 209 (H) 70 - 99 mg/dL    Comment: Glucose reference range applies only to samples taken after fasting for at least 8 hours.   BUN 59 (H) 8 - 23 mg/dL   Creatinine, Ser 6.29 (H) 0.44 - 1.00 mg/dL   Calcium 8.7 (L) 8.9 - 10.3 mg/dL   Total Protein 6.9 6.5 - 8.1 g/dL   Albumin 2.9 (L) 3.5 - 5.0 g/dL   AST 10 (L) 15 - 41 U/L   ALT 8 0 - 44 U/L   Alkaline Phosphatase 47 38 - 126 U/L   Total Bilirubin 0.6 0.3 - 1.2 mg/dL   GFR, Estimated 44 (L) >60 mL/min    Comment: (NOTE) Calculated using the CKD-EPI Creatinine Equation (2021)    Anion gap 8 5 - 15    Comment: Performed at [facility], [address] [place], [place] [address]  Magnesium     Status: None   Collection Time: 07/22/22  5:14 AM  Result Value Ref Range   Magnesium 2.4 1.7 - 2.4 mg/dL    Comment: Performed at [facility],  [address] [place], [place] [address]  Ferritin     Status: None   Collection Time: 07/22/22  5:14 AM  Result Value Ref Range   Ferritin 100 11 - 307 ng/mL    Comment: Performed at [facility], [address] [place], [place] [address]  Iron and TIBC     Status: Abnormal   Collection Time: 07/22/22  5:14 AM  Result Value Ref Range   Iron 122 28 - 170 ug/dL   TIBC 098 119 - 147 ug/dL   Saturation Ratios 34 (H) 10.4 - 31.8 %   UIBC 234 ug/dL    Comment: Performed at [facility], [address] [place], [place] [address]   Reticulocytes     Status: Abnormal   Collection Time: 07/22/22  5:14 AM  Result Value Ref Range   Retic Ct Pct 0.5 0.4 - 3.1 %   RBC. 2.69 (L) 3.87 - 5.11 MIL/uL   Retic Count, Absolute 13.5 (L) 19.0 - 186.0 K/uL   Immature Retic Fract 2.2 (L) 2.3 - 15.9 %    Comment: Performed at [facility], [address] [place], [place] [address]  Glucose, capillary     Status: Abnormal   Collection Time: 07/22/22  7:23 AM  Result Value Ref Range   Glucose-Capillary 139 (H) 70 - 99 mg/dL    Comment: Glucose reference range applies only to samples taken after fasting for at least 8 hours.  Glucose, capillary     Status: Abnormal   Collection Time: 07/22/22 11:36 AM  Result Value Ref Range   Glucose-Capillary 217 (H) 70 - 99 mg/dL    Comment: Glucose reference range applies only to samples taken after fasting for at least 8 hours.   CT ABDOMEN PELVIS W CONTRAST  Result Date: 07/21/2022 CLINICAL DATA:  Abdominal wall wound. Evaluate for deep space infection. EXAM: CT ABDOMEN AND PELVIS WITH CONTRAST TECHNIQUE: Multidetector CT imaging of the abdomen and pelvis was performed using the standard protocol following bolus administration of intravenous contrast. RADIATION DOSE REDUCTION: This exam was performed according to the departmental dose-optimization program which includes automated exposure control, adjustment of the mA and/or kV according to patient size and/or use of iterative reconstruction technique. CONTRAST:  75mL OMNIPAQUE IOHEXOL 300 MG/ML  SOLN COMPARISON:  CT abdomen and pelvis 10/06/2021 FINDINGS: Lower chest: No acute abnormality. Hepatobiliary: The liver is enlarged, unchanged. The gallbladder is surgically absent. There is no biliary ductal dilatation. Pancreas: Rounded 14 mm low-attenuation lesion in the head of the pancreas has slightly decreased in size. Pancreas is otherwise within normal limits. Spleen: Moderately enlarged, unchanged. Adrenals/Urinary Tract: There  are punctate nonobstructing bilateral renal calculi. There is no hydronephrosis or perinephric stranding. The adrenal glands and bladder are within normal limits. Stomach/Bowel: No evidence of bowel wall thickening, distention, or inflammatory changes. The appendix is not seen. There is a large amount of stool throughout the colon. There surgical changes in the stomach. Vascular/Lymphatic: Aortic atherosclerosis. No enlarged abdominal or pelvic lymph nodes. Reproductive: Uterus is smaller absent.  Adnexa are unremarkable. Other: There is skin thickening and subcutaneous stranding of the anterior inferior abdominal wall. There is no evidence for soft tissue gas or abscess. There is a small inferior left ventral hernia containing nondilated bowel, unchanged. There is no ascites. Musculoskeletal: Thoracolumbar fusion hardware is present. IMPRESSION: 1. Findings compatible with cellulitis of the anterior inferior abdominal wall. No evidence for soft tissue gas or abscess. 2. Stable hepatosplenomegaly. 3.  Nonobstructing bilateral renal calculi. 4. Stable small left ventral hernia containing nondilated bowel. 5. 14 mm cystic lesion in the head of the pancreas has slightly decreased in size. Recommend follow-up MRI in 1 year. Aortic Atherosclerosis (ICD10-I70.0). Electronically Signed   By: Darliss Cheney M.D.   On: 07/21/2022 23:02   DG Chest Port 1 View  Result Date: 07/21/2022 CLINICAL DATA:  Questionable sepsis. EXAM: PORTABLE CHEST 1 VIEW COMPARISON:  Portable chest 03/10/2022 FINDINGS: Mild cardiomegaly without evidence of CHF. The mediastinum is normally outlined. Minimal calcification of the transverse aorta. There are scattered linear scar-like opacities in the lower lung fields. No focal pneumonia is evident or pleural effusion. There are no acute osseous findings. There is partially visible lower thoracic/lumbar fusion hardware and a reverse right shoulder arthroplasty with left shoulder DJD and chronic  rotator cuff arthropathy. Degenerative change thoracic spine. IMPRESSION: 1. Cardiomegaly without evidence of CHF or pneumonia. 2. Aortic atherosclerosis. 3. Chronic changes as above. Electronically Signed   By: Almira Bar M.D.   On: 07/21/2022 21:57      Assessment/Plan Abdominal wall soft tissue infection H/o of necrotizing fasciitis s/p debridements and grafting (2018-2019)  Patient seen and examined and relevant labs and imaging personally reviewed. I have reviewed notes and labs from her recent hospital admission as well. Work up at OSH included wound culture growing MSSA, rare Enterococcus hirae, and rare Streptococcus pyogenes (group A). She completed linezolid course. CT 5/1 with findings of cellulitis but no soft tissue gas or abscess  Abdominal wound continues to have complete coverage of eschar.  This will require debridement in the operating room to get this cleaned up.  After debridement, hopefully she can progress to a wound VAC if able to assist with healing.  The procedure was explained to the patient and she understands and is agreeable to proceed to the OR tomorrow  FEN: regular, NPO p MN ID: Vanc/Rocephin VTE: okay for chemical prophylaxis from surgical standpoint  Dispo: OR tomorrow  DM CHF Pancytopenia HLD HTN Hypothyroidism Obesity OSA Fibromyalgia   I reviewed hospitalist notes, last 24 h vitals and pain scores, last 48 h intake and output, last 24 h labs and trends, and last 24 h imaging results.   Letha Cape, [facility] Surgery 07/22/2022, 12:54 PM Please see Amion for pager number during day hours 7:00am-4:30pm
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
## 2022-07-22 NOTE — Progress Notes (Signed)
No charge progress note.  [name] is a 72 y.o. female with medical history significant for type 2 diabetes mellitus, hyperlipidemia, essential hypertension, hypothyroidism, chronic diastolic heart failure, chronic pancytopenia, who is admitted to [facility] on 07/21/2022 with abdominal wall wound with surrounding cellulitis.  The patient was recently hospitalized at [facility] for cellulitis involving the abdominal wall, for which she was discharged home 1 week ago on oral linezolid.  Over the last 2 to 3 days, she has noted further progression and associated erythema over the abdomen, associated with increased tenderness, swelling, increased warmth to the touch.  She underwent routine follow-up with wound care earlier on 07/21/2022, who agreed that the advantages interval worsening in erythema, prompting wound care RN to recommend that the patient present to the emergency department for further evaluation and management.  ED course.  Vital stable, afebrile.  Labs pertinent for creatinine of 1.53, most recent on 4/13 was 1.36, it was below 1 in February 2024.  WBC of 1.9, compared to 2.7 on 07/13/2022.  Hemoglobin 7.26 as compared to 8.2 on 4/23.  UA was negative for UTI. Blood cultures were collected.  CT chest was without any acute abnormality. CT abdominal and pelvis evidence of cellulitis of anterior inferior abdominal wall, without evidence of subcutaneous gas or abscesses, did show a 14 mm cystic lesion in the head of pancreas which is slightly decreased as compared to the prior CT with radiology recommendations of follow-up MRI in 1 year.  She received IV fluid and placed on ceftriaxone and vancomycin.  5/2: Patient remained afebrile.  Labs with inappropriately normal reticulocyte count, iron and ferritin normal.  WBC 1.6, hemoglobin 7.2 and platelets of 104.  Creatinine improved to 1.30 with IV fluid. Patient with recent admission with [facility] with sepsis and  septic shock, cultures at that time grew MSSA, rare Enterococcus Hartery and rare Streptococcus pyogenes per care everywhere.  She was discharge with wound care and linezolid.  Per patient she developed some blisters followed by open wound in her belly during that time. Her left lower quadrant wound continued to get worse so her wound care facility asked her to come to ED. On exam she has a large left lower quadrant abdominal wound with a lot of eschar, pustular drainage and surrounding erythema.  Wound care General surgery was consulted.  As patient will get benefit from debridement.  Rest of the exam was normal. Discussed with husband at bedside and he agrees with general surgery consult and also wound debridement if needed.

## 2022-07-22 NOTE — Progress Notes (Addendum)
Pharmacy Antibiotic Note  [name] is a 72 y.o. female admitted on 07/21/2022 with wound infection to the abdomen.  Pharmacy has been consulted for vancomycin dosing for cellulitis.    Plan: - Vancomycin 1.25gm IV q48h (est: AUC 468, Scr 1.3) - Measure Vancomycin levels as needed. Goal AUC: 400-550 - Follow up renal function, culture results, and clinical course.    Height: 5' (152.4 cm) Weight: 93.9 kg (207 lb) IBW/kg (Calculated) : 45.5  Temp (24hrs), Avg:98 F (36.7 C), Min:97.8 F (36.6 C), Max:98.4 F (36.9 C)  Recent Labs  Lab 07/21/22 2200 07/21/22 2216 07/22/22 0514  WBC 1.9*  --  1.6*  CREATININE 1.53* 1.60* 1.30*  LATICACIDVEN 1.8  --   --      Estimated Creatinine Clearance: 41.3 mL/min (A) (by C-G formula based on SCr of 1.3 mg/dL (H)).    Allergies  Allergen Reactions   Sumatriptan Other (See Comments) and Anaphylaxis    Vascular spasms  Other Reaction(s): Other  Facial spasms, Vasospasms   Codeine Nausea And Vomiting    Other Reaction(s): GI Intolerance   Nsaids Other (See Comments)    PT UNABLE TO TOLERATE NSAID'S DRUGS DUE TO HX OF GASTRIC SLEEVE SURGERY   Statins Other (See Comments)    Leg/muscle pain   Sulfonamide Derivatives Hives    Childhood allergy    Hydrocodone-Acetaminophen Nausea And Vomiting    Other Reaction(s): GI Intolerance   Methimazole Hives   Niacin     heart pounding and beating fast.   Promethazine Hcl Other (See Comments)    Restless leg feeling all over body   Vicodin [Hydrocodone-Acetaminophen] Nausea And Vomiting    Antimicrobials this admission: 5/1 Vancomycin >> 5/2 Ceftriaxone  Microbiology results: 5/1 BCx: sent  Thank you for allowing pharmacy to be a part of this patient's care.  Tacy Learn, PharmD Clinical Pharmacist 07/22/2022 11:05 AM

## 2022-07-22 NOTE — Consult Note (Signed)
WOC Nurse Consult Note: Reason for Consult: abdominal wall wound Patient seen in outside hospital for same, place on oral antibiotics.  CT negative for gas. CCS to evaluate patient. Patient with history of NF (2018) and skin grafting (2019).  Wound type: necrotic wound, full thickness Pressure Injury POA: NA Measurement: see nursing flow sheets Wound bed:95% non viable tissue/5% viable at wound edge along the distal edge of the wound  Drainage (amount, consistency, odor)) see nursing flow sheets and CCS consultation  Periwound: erythema  Dressing procedure/placement/frequency: Pending general surgery consultation Suggestion: surgical debridement.  Enzymatic will be very long process. PT for hydro after surgical however PT is not providing hydrotherapy truly providing serial CSWD 2x wk.    Will follow along    Thanks  Tanith Dagostino [facility] MSN, RN,CWOCN, CNS, CWON-AP [phone])

## 2022-07-23 ENCOUNTER — Inpatient Hospital Stay (HOSPITAL_COMMUNITY): Payer: Medicare Other | Admitting: Certified Registered Nurse Anesthetist

## 2022-07-23 ENCOUNTER — Other Ambulatory Visit: Payer: Self-pay

## 2022-07-23 ENCOUNTER — Encounter (HOSPITAL_COMMUNITY): Payer: Self-pay | Admitting: Internal Medicine

## 2022-07-23 ENCOUNTER — Encounter (HOSPITAL_COMMUNITY)
Admission: EM | Disposition: A | Discharge status: Home Health | Payer: Self-pay | Source: Home / Self Care | Attending: Internal Medicine

## 2022-07-23 DIAGNOSIS — L03311 Cellulitis of abdominal wall: Secondary | ICD-10-CM | POA: Diagnosis not present

## 2022-07-23 DIAGNOSIS — J4540 Moderate persistent asthma, uncomplicated: Secondary | ICD-10-CM | POA: Diagnosis not present

## 2022-07-23 DIAGNOSIS — I10 Essential (primary) hypertension: Secondary | ICD-10-CM | POA: Diagnosis not present

## 2022-07-23 DIAGNOSIS — E039 Hypothyroidism, unspecified: Secondary | ICD-10-CM | POA: Diagnosis not present

## 2022-07-23 DIAGNOSIS — D638 Anemia in other chronic diseases classified elsewhere: Secondary | ICD-10-CM

## 2022-07-23 DIAGNOSIS — E119 Type 2 diabetes mellitus without complications: Secondary | ICD-10-CM

## 2022-07-23 DIAGNOSIS — M726 Necrotizing fasciitis: Secondary | ICD-10-CM

## 2022-07-23 DIAGNOSIS — M7989 Other specified soft tissue disorders: Secondary | ICD-10-CM | POA: Diagnosis not present

## 2022-07-23 DIAGNOSIS — D61818 Other pancytopenia: Secondary | ICD-10-CM | POA: Diagnosis not present

## 2022-07-23 DIAGNOSIS — N19 Unspecified kidney failure: Secondary | ICD-10-CM

## 2022-07-23 DIAGNOSIS — F418 Other specified anxiety disorders: Secondary | ICD-10-CM | POA: Diagnosis not present

## 2022-07-23 DIAGNOSIS — E1142 Type 2 diabetes mellitus with diabetic polyneuropathy: Secondary | ICD-10-CM | POA: Diagnosis not present

## 2022-07-23 DIAGNOSIS — Z794 Long term (current) use of insulin: Secondary | ICD-10-CM

## 2022-07-23 HISTORY — PX: INCISION AND DRAINAGE ABSCESS: SHX5864

## 2022-07-23 LAB — CBC
HCT: 24.9 % — ABNORMAL LOW (ref 36.0–46.0)
Hemoglobin: 7.7 g/dL — ABNORMAL LOW (ref 12.0–15.0)
MCH: 26.3 pg (ref 26.0–34.0)
MCHC: 30.9 g/dL (ref 30.0–36.0)
MCV: 85 fL (ref 80.0–100.0)
Platelets: 110 10*3/uL — ABNORMAL LOW (ref 150–400)
RBC: 2.93 MIL/uL — ABNORMAL LOW (ref 3.87–5.11)
RDW: 13.8 % (ref 11.5–15.5)
WBC: 2.6 10*3/uL — ABNORMAL LOW (ref 4.0–10.5)
nRBC: 0 % (ref 0.0–0.2)

## 2022-07-23 LAB — BPAM RBC
Blood Product Expiration Date: 202405152359
Blood Product Expiration Date: 202405192359
Unit Type and Rh: 600

## 2022-07-23 LAB — BASIC METABOLIC PANEL
Anion gap: 9 (ref 5–15)
BUN: 46 mg/dL — ABNORMAL HIGH (ref 8–23)
CO2: 25 mmol/L (ref 22–32)
Calcium: 9.2 mg/dL (ref 8.9–10.3)
Chloride: 103 mmol/L (ref 98–111)
Creatinine, Ser: 1.45 mg/dL — ABNORMAL HIGH (ref 0.44–1.00)
GFR, Estimated: 39 mL/min — ABNORMAL LOW (ref >60)
Glucose, Bld: 135 mg/dL — ABNORMAL HIGH (ref 70–99)
Potassium: 5.1 mmol/L (ref 3.5–5.1)
Sodium: 137 mmol/L (ref 135–145)

## 2022-07-23 LAB — TYPE AND SCREEN: Antibody Screen: NEGATIVE

## 2022-07-23 LAB — GLUCOSE, CAPILLARY
Glucose-Capillary: 128 mg/dL — ABNORMAL HIGH (ref 70–99)
Glucose-Capillary: 125 mg/dL — ABNORMAL HIGH (ref 70–99)
Glucose-Capillary: 120 mg/dL — ABNORMAL HIGH (ref 70–99)
Glucose-Capillary: 230 mg/dL — ABNORMAL HIGH (ref 70–99)
Glucose-Capillary: 239 mg/dL — ABNORMAL HIGH (ref 70–99)

## 2022-07-23 LAB — PREPARE RBC (CROSSMATCH)

## 2022-07-23 LAB — CULTURE, BLOOD (ROUTINE X 2): Culture: NO GROWTH

## 2022-07-23 SURGERY — INCISION AND DRAINAGE, ABSCESS
Anesthesia: General | Site: Abdomen | Laterality: Left

## 2022-07-23 MED ORDER — DEXAMETHASONE SODIUM PHOSPHATE 10 MG/ML IJ SOLN
INTRAMUSCULAR | Status: AC
  Filled 2022-07-23: qty 1

## 2022-07-23 MED ORDER — LIDOCAINE 2% (20 MG/ML) 5 ML SYRINGE
INTRAMUSCULAR | Status: DC | PRN
  Administered 2022-07-23: 100 mg via INTRAVENOUS

## 2022-07-23 MED ORDER — PHENYLEPHRINE 80 MCG/ML (10ML) SYRINGE FOR IV PUSH (FOR BLOOD PRESSURE SUPPORT)
PREFILLED_SYRINGE | INTRAVENOUS | Status: DC | PRN
  Administered 2022-07-23: 80 ug via INTRAVENOUS
  Administered 2022-07-23: 120 ug via INTRAVENOUS
  Administered 2022-07-23 (×2): 80 ug via INTRAVENOUS

## 2022-07-23 MED ORDER — FENTANYL CITRATE (PF) 100 MCG/2ML IJ SOLN
INTRAMUSCULAR | Status: AC
  Filled 2022-07-23: qty 2

## 2022-07-23 MED ORDER — ROCURONIUM BROMIDE 10 MG/ML (PF) SYRINGE
PREFILLED_SYRINGE | INTRAVENOUS | Status: DC | PRN
  Administered 2022-07-23: 10 mg via INTRAVENOUS
  Administered 2022-07-23: 50 mg via INTRAVENOUS
  Administered 2022-07-23: 20 mg via INTRAVENOUS

## 2022-07-23 MED ORDER — SUGAMMADEX SODIUM 200 MG/2ML IV SOLN
INTRAVENOUS | Status: DC | PRN
  Administered 2022-07-23: 200 mg via INTRAVENOUS

## 2022-07-23 MED ORDER — LIDOCAINE HCL (PF) 2 % IJ SOLN
INTRAMUSCULAR | Status: AC
  Filled 2022-07-23: qty 5

## 2022-07-23 MED ORDER — ROCURONIUM BROMIDE 10 MG/ML (PF) SYRINGE
PREFILLED_SYRINGE | INTRAVENOUS | Status: AC
  Filled 2022-07-23: qty 10

## 2022-07-23 MED ORDER — ENSURE MAX PROTEIN PO LIQD
11.0000 [oz_av] | Freq: Two times a day (BID) | ORAL | Status: DC
  Administered 2022-07-23 – 2022-07-29 (×8): 11 [oz_av] via ORAL

## 2022-07-23 MED ORDER — ADULT MULTIVITAMIN W/MINERALS CH
1.0000 | ORAL_TABLET | Freq: Every day | ORAL | Status: DC
  Administered 2022-07-23 – 2022-07-30 (×7): 1 via ORAL
  Filled 2022-07-23 (×8): qty 1

## 2022-07-23 MED ORDER — ONDANSETRON HCL 4 MG/2ML IJ SOLN
INTRAMUSCULAR | Status: AC
  Filled 2022-07-23: qty 2

## 2022-07-23 MED ORDER — VITAMIN C 500 MG PO TABS
500.0000 mg | ORAL_TABLET | Freq: Two times a day (BID) | ORAL | Status: DC
  Administered 2022-07-23 – 2022-07-30 (×15): 500 mg via ORAL
  Filled 2022-07-23 (×15): qty 1

## 2022-07-23 MED ORDER — ROPINIROLE HCL 1 MG PO TABS
2.0000 mg | ORAL_TABLET | Freq: Every day | ORAL | Status: AC
  Administered 2022-07-23: 2 mg via ORAL
  Filled 2022-07-23: qty 2

## 2022-07-23 MED ORDER — ENSURE ENLIVE PO LIQD
237.0000 mL | ORAL | Status: DC
  Administered 2022-07-25 – 2022-07-27 (×4): 237 mL via ORAL

## 2022-07-23 MED ORDER — DEXAMETHASONE SODIUM PHOSPHATE 10 MG/ML IJ SOLN
INTRAMUSCULAR | Status: DC | PRN
  Administered 2022-07-23: 4 mg via INTRAVENOUS

## 2022-07-23 MED ORDER — ONDANSETRON HCL 4 MG/2ML IJ SOLN
INTRAMUSCULAR | Status: DC | PRN
  Administered 2022-07-23: 4 mg via INTRAVENOUS

## 2022-07-23 MED ORDER — EPHEDRINE SULFATE-NACL 50-0.9 MG/10ML-% IV SOSY
PREFILLED_SYRINGE | INTRAVENOUS | Status: DC | PRN
  Administered 2022-07-23 (×4): 5 mg via INTRAVENOUS

## 2022-07-23 MED ORDER — PROPOFOL 500 MG/50ML IV EMUL
INTRAVENOUS | Status: DC | PRN
  Administered 2022-07-23: 150 ug/kg/min via INTRAVENOUS

## 2022-07-23 MED ORDER — LACTATED RINGERS IV SOLN: INTRAVENOUS | Status: DC | PRN

## 2022-07-23 MED ORDER — PROPOFOL 10 MG/ML IV BOLUS
INTRAVENOUS | Status: AC
  Filled 2022-07-23: qty 20

## 2022-07-23 MED ORDER — JUVEN PO PACK
1.0000 | PACK | Freq: Two times a day (BID) | ORAL | Status: DC
  Administered 2022-07-23 – 2022-07-30 (×11): 1 via ORAL
  Filled 2022-07-23 (×11): qty 1

## 2022-07-23 MED ORDER — ZINC SULFATE 220 (50 ZN) MG PO CAPS
220.0000 mg | ORAL_CAPSULE | Freq: Every day | ORAL | Status: DC
  Administered 2022-07-23 – 2022-07-30 (×8): 220 mg via ORAL
  Filled 2022-07-23 (×8): qty 1

## 2022-07-23 MED ORDER — MIDAZOLAM HCL 2 MG/2ML IJ SOLN
INTRAMUSCULAR | Status: AC
  Filled 2022-07-23: qty 2

## 2022-07-23 MED ORDER — FENTANYL CITRATE (PF) 100 MCG/2ML IJ SOLN
INTRAMUSCULAR | Status: DC | PRN
  Administered 2022-07-23 (×2): 25 ug via INTRAVENOUS
  Administered 2022-07-23: 50 ug via INTRAVENOUS

## 2022-07-23 MED ORDER — PROPOFOL 10 MG/ML IV BOLUS
INTRAVENOUS | Status: DC | PRN
  Administered 2022-07-23: 140 mg via INTRAVENOUS

## 2022-07-23 MED ORDER — LACTATED RINGERS IV SOLN: INTRAVENOUS | Status: DC

## 2022-07-23 MED ORDER — 0.9 % SODIUM CHLORIDE (POUR BTL) OPTIME
TOPICAL | Status: DC | PRN
  Administered 2022-07-23: 1000 mL

## 2022-07-23 SURGICAL SUPPLY — 30 items
BAG COUNTER SPONGE SURGICOUNT (BAG) IMPLANT
BLADE SURG 15 STRL LF DISP TIS (BLADE) ×1 IMPLANT
BLADE SURG 15 STRL SS (BLADE) ×1
BNDG GAUZE DERMACEA FLUFF 4 (GAUZE/BANDAGES/DRESSINGS) IMPLANT
CHLORAPREP W/TINT 26 (MISCELLANEOUS) ×1 IMPLANT
DRAPE LAPAROSCOPIC ABDOMINAL (DRAPES) IMPLANT
ELECT REM PT RETURN 15FT ADLT (MISCELLANEOUS) ×1 IMPLANT
GAUZE PAD ABD 7.5X8 STRL (GAUZE/BANDAGES/DRESSINGS) IMPLANT
GAUZE PAD ABD 8X10 STRL (GAUZE/BANDAGES/DRESSINGS) IMPLANT
GAUZE SPONGE 4X4 12PLY STRL (GAUZE/BANDAGES/DRESSINGS) IMPLANT
GLOVE BIO SURGEON STRL SZ7.5 (GLOVE) ×1 IMPLANT
GLOVE SURG SYN 7.5  E (GLOVE) ×2
GLOVE SURG SYN 7.5 E (GLOVE) ×2 IMPLANT
GLOVE SURG SYN 7.5 PF PI (GLOVE) ×2 IMPLANT
KIT BASIN OR (CUSTOM PROCEDURE TRAY) ×1 IMPLANT
KIT TURNOVER KIT A (KITS) IMPLANT
NDL HYPO 25X1 1.5 SAFETY (NEEDLE) IMPLANT
NEEDLE HYPO 25X1 1.5 SAFETY (NEEDLE) IMPLANT
NS IRRIG 1000ML POUR BTL (IV SOLUTION) ×1 IMPLANT
PENCIL SMOKE EVACUATOR (MISCELLANEOUS) IMPLANT
SPIKE FLUID TRANSFER (MISCELLANEOUS) IMPLANT
SUT MNCRL AB 4-0 PS2 18 (SUTURE) IMPLANT
SUT VIC AB 3-0 SH 27 (SUTURE) ×1
SUT VIC AB 3-0 SH 27XBRD (SUTURE) IMPLANT
SWAB COLLECTION DEVICE MRSA (MISCELLANEOUS) IMPLANT
SWAB CULTURE ESWAB REG 1ML (MISCELLANEOUS) IMPLANT
SYR BULB EAR ULCER 3OZ GRN STR (SYRINGE) ×1 IMPLANT
SYR CONTROL 10ML LL (SYRINGE) IMPLANT
TOWEL OR 17X26 10 PK STRL BLUE (TOWEL DISPOSABLE) ×1 IMPLANT
YANKAUER SUCT BULB TIP NO VENT (SUCTIONS) ×1 IMPLANT

## 2022-07-23 NOTE — Anesthesia Postprocedure Evaluation (Signed)
Anesthesia Post Note  Patient: [name]  Procedure(s) Performed: INCISION AND DRAINAGE ABSCESS (Left: Abdomen)     Patient location during evaluation: PACU Anesthesia Type: General Level of consciousness: awake Pain management: pain level controlled Vital Signs Assessment: post-procedure vital signs reviewed and stable Respiratory status: spontaneous breathing, nonlabored ventilation and respiratory function stable Cardiovascular status: blood pressure returned to baseline and stable Postop Assessment: no apparent nausea or vomiting Anesthetic complications: no   No notable events documented.  Last Vitals:  Vitals:   07/23/22 1306 07/23/22 1416  BP: 132/66 (!) 153/47  Pulse: 71 74  Resp: 16 14  Temp: 36.7 C 36.7 C  SpO2: 98% 98%    Last Pain:  Vitals:   07/23/22 1416  TempSrc: Oral  PainSc:                  Linton Rump

## 2022-07-23 NOTE — Anesthesia Procedure Notes (Signed)
Procedure Name: Intubation Date/Time: 07/23/2022 11:13 AM  Performed by: Sindy Guadeloupe, CRNAPre-anesthesia Checklist: Patient identified, Emergency Drugs available, Suction available, Patient being monitored and Timeout performed Patient Re-evaluated:Patient Re-evaluated prior to induction Oxygen Delivery Method: Circle system utilized Preoxygenation: Pre-oxygenation with 100% oxygen Induction Type: IV induction Ventilation: Mask ventilation without difficulty Laryngoscope Size: Glidescope and 3 Grade View: Grade I Tube type: Oral Number of attempts: 1 Airway Equipment and Method: Stylet Placement Confirmation: ETT inserted through vocal cords under direct vision, positive ETCO2 and breath sounds checked- equal and bilateral Secured at: 20 cm Tube secured with: Tape Dental Injury: Teeth and Oropharynx as per pre-operative assessment

## 2022-07-23 NOTE — Progress Notes (Signed)
Initial Nutrition Assessment  DOCUMENTATION CODES:   Obesity unspecified  INTERVENTION:   -Needs weight for admission  -Ensure Plus High Protein po daily, each supplement provides 350 kcal and 20 grams of protein.   -Ensure MAX Protein po BID, each supplement provides 150 kcal and 30 grams of protein   -1 packet Juven BID, each packet provides 95 calories, 2.5 grams of protein (collagen), and 9.8 grams of carbohydrate (3 grams sugar); also contains 7 grams of L-arginine and L-glutamine, 300 mg vitamin C, 15 mg vitamin E, 1.2 mcg vitamin B-12, 9.5 mg zinc, 200 mg calcium, and 1.5 g  Calcium Beta-hydroxy-Beta-methylbutyrate to support wound healing   -Multivitamin with minerals daily -500 mg Vitamin C BID to promote wound healing -220 mg Zinc sulfate x 14 days to promote wound healing  -Checking Vitamin A labs given wound healing needs and not taking daily MVI with history of gastric sleeve.   NUTRITION DIAGNOSIS:   Increased nutrient needs related to wound healing as evidenced by estimated needs.  GOAL:   Patient will meet greater than or equal to 90% of their needs  MONITOR:   PO intake, Supplement acceptance, Labs, Weight trends, I & O's, Skin  REASON FOR ASSESSMENT:   Consult Wound healing  ASSESSMENT:   72 y.o. female with medical history significant for type 2 diabetes mellitus, hyperlipidemia, essential hypertension, hypothyroidism, chronic diastolic heart failure, chronic pancytopenia, who is admitted to [facility] on 07/21/2022 with abdominal wall wound with surrounding cellulitis.  5/3: s/p excisional debridement of left groin soft tissue infection   Patient in room, getting set up in bed. Husband at bedside. Pt reports feeling okay after her procedure this morning. Pt with history of gastric sleeve surgery in 2014. Reports at home she was eating okay. For breakfast typically eats some oatmeal, then for lunch would either have a sandwich or soup or a  Breakstone cottage cheese cup with fruit. She drinks 2 Ensure Max daily. States she was told by a dietitian that she needed to start Juven so she has been drinking these for the past week. Pt takes Vitamin D, B-12 and Creon at home. She is agreeable to starting MVI, Vitamin C and Zinc to aid in wound healing. Will resume Juven and add an additional higher calorie Ensure for more protein and kcals.  Pt states she has been having hair loss. Will check Vitamin A given this and to see if a possible deficiency may be impacting healing process.   Pt reports her weight is usually ~207 lbs. States she was not weighed at admission time. Last recorded weight was 212 lbs on 4/13. Will order for new weight to see where pt is at.  Per weight records, pt weighed ~244 lbs on 11/25/21.   Medications: Creon, Lactated ringers  Labs reviewed: CBGs: 94-200   NUTRITION - FOCUSED PHYSICAL EXAM:  Flowsheet Row Most Recent Value  Orbital Region No depletion  Upper Arm Region Mild depletion  Thoracic and Lumbar Region No depletion  Buccal Region No depletion  Temple Region No depletion  Clavicle Bone Region No depletion  Clavicle and Acromion Bone Region No depletion  Scapular Bone Region No depletion  Dorsal Hand Mild depletion  Patellar Region No depletion  Anterior Thigh Region No depletion  Posterior Calf Region No depletion  Edema (RD Assessment) None  Hair Reviewed  [reports hair loss]  Eyes Reviewed  Mouth Reviewed  Skin Reviewed  Nails Reviewed       Diet Order:  Diet Order             Diet heart healthy/carb modified Room service appropriate? Yes; Fluid consistency: Thin  Diet effective now                   EDUCATION NEEDS:   Education needs have been addressed  Skin:  Skin Assessment: Skin Integrity Issues: Skin Integrity Issues:: Incisions Incisions: 5/3 abdomen  Last BM:  PTA  Height:   Ht Readings from Last 1 Encounters:  07/21/22 5' (1.524 m)    Weight:   Wt  Readings from Last 1 Encounters:  07/21/22 93.9 kg    BMI:  Body mass index is 40.43 kg/m.  Estimated Nutritional Needs:   Kcal:  1700-1900  Protein:  90-100g  Fluid:  1.9L/day    Tilda Franco, MS, RD, LDN Inpatient Clinical Dietitian Contact information available via Amion

## 2022-07-23 NOTE — Interval H&P Note (Signed)
History and Physical Interval Note:  07/23/2022 10:48 AM  [name]  has presented today for surgery, with the diagnosis of NECROSIS OF SOFT TISSUE INFECTION.  The various methods of treatment have been discussed with the patient and family. After consideration of risks, benefits and other options for treatment, the patient has consented to    Procedure(s): INCISION AND DRAINAGE ABSCESS (Left) as a surgical intervention.    The patient's history has been reviewed, patient examined, no change in status, stable for surgery.  I have reviewed the patient's chart and labs.  Questions were answered to the patient's satisfaction.    Darnell Level, MD [facility] practice Office: [phone]   Darnell Level

## 2022-07-23 NOTE — Transfer of Care (Signed)
Immediate Anesthesia Transfer of Care Note  Patient: [name]  Procedure(s) Performed: INCISION AND DRAINAGE ABSCESS (Left: Abdomen)  Patient Location: PACU  Anesthesia Type:General  Level of Consciousness: awake, alert , and patient cooperative  Airway & Oxygen Therapy: Patient Spontanous Breathing and Patient connected to face mask oxygen  Post-op Assessment: Report given to RN and Post -op Vital signs reviewed and stable  Post vital signs: Reviewed and stable  Last Vitals:  Vitals Value Taken Time  BP 116/48 07/23/22 1211  Temp    Pulse 66 07/23/22 1212  Resp 19 07/23/22 1212  SpO2 100 % 07/23/22 1212  Vitals shown include unvalidated device data.  Last Pain:  Vitals:   07/23/22 1010  TempSrc: Oral  PainSc:       Patients Stated Pain Goal: 2 (07/22/22 2312)  Complications: No notable events documented.

## 2022-07-23 NOTE — Progress Notes (Signed)
PROGRESS NOTE  [name] ZOX:[id number] DOB: [birth year]   PCP: Rodrigo Ran, MD  Patient is from: Home  DOA: 07/21/2022 LOS: 1  Chief complaints Chief Complaint  Patient presents with   Wound Infection     Brief Narrative / Interim history: 72 year old F with PMH of diastolic CHF, DM-2, HTN, HLD, pancytopenia and morbid obesity presenting with worsening abdominal wound with surrounding cellulitis.  She was recently hospitalized at [facility] and discharged on Zyvox about a week prior to presentation here.  Labs significant for pancytopenia.  CT abdomen and pelvis with evidence of cellulitis without subcutaneous gas or abscess.  She was started on IV antibiotics.  General surgery consulted.  She underwent excision debridement on 07/23/2022.  Remains on vancomycin and Zosyn.    Subjective: Seen and examined this afternoon after she returned from surgery.  Pain fairly controlled.  Feels hungry.  No other complaints.  She denies chest pain or shortness of breath.  Objective: Vitals:   07/23/22 1230 07/23/22 1243 07/23/22 1306 07/23/22 1416  BP: (!) 147/58 (!) 151/62 132/66 (!) 153/47  Pulse: 69 68 71 74  Resp: 14 16 16 14   Temp:  98.4 F (36.9 C) 98 F (36.7 C) 98.1 F (36.7 C)  TempSrc:   Oral Oral  SpO2: 93% 96% 98% 98%  Weight:      Height:        Examination:  GENERAL: No apparent distress.  Nontoxic. HEENT: MMM.  Vision and hearing grossly intact.  NECK: Supple.  No apparent JVD.  RESP:  No IWOB.  Fair aeration bilaterally. CVS:  RRR. Heart sounds normal.  ABD/GI/GU: BS+. Abd soft, NTND.  Dressing over excisional wound with some bloody staining to the left. MSK/EXT:  Moves extremities. No apparent deformity. No edema.  SKIN: no apparent skin lesion or wound NEURO: Awake, alert and oriented appropriately.  No apparent focal neuro deficit. PSYCH: Calm. Normal affect.   Procedures:  5/3-excisional debridement of abdominal wound  Microbiology summarized: MRSA PCR  screen nonreactive Blood cultures NGTD  Assessment and plan: Principal Problem:   Cellulitis of abdominal wall Active Problems:   DM2 (diabetes mellitus, type 2) (HCC)   Hyperlipidemia   AKI (acute kidney injury) (HCC)   Graves disease   Essential hypertension   Pancytopenia (HCC)   Acute prerenal azotemia   Moderate persistent asthma   Chronic diastolic CHF (congestive heart failure) (HCC)  Abdominal wall wound with cellulitis: Hospitalized at [facility] for septic shock related to abdominal sterile cellulitis from 4/17-4/24.  Wound culture positive for MSSA, rare Enterococcus hirae and rare Streptococcus pyogenes.  Discharged on p.o. linezolid and outpatient wound care.  Presents with worsening cellulitis despite antibiotics and wound care.  CT abdomen and pelvis concerning for cellulitis without subcutaneous gas or abscess.  Vital stable.  No fever.  She has pancytopenia.  Blood cultures NGTD.  MRSA PCR screen nonreactive. -S/p excisional debridement by surgery on 5/3 -Continue vancomycin and ceftriaxone -Wound care  Controlled IDDM-2 with hyperglycemia and hyperlipidemia: A1c 6.8% Recent Labs  Lab 07/22/22 1623 07/22/22 2046 07/23/22 0740 07/23/22 1002 07/23/22 1219  GLUCAP 94 200* 128* 125* 120*  -Continue current insulin regimen  Chronic diastolic CHF: TTE in 12/23 with LVEF of 55 to 60%, G1 DD.  On p.o. Lasix 40 mg daily at home.  Denies cardiopulmonary symptoms.  Appears euvolemic on exam but difficult due to body habitus. -Continue holding Lasix for now -Closely monitor respiratory status and fluid status while on IV fluid  AKI/azotemia:  Baseline Cr 0.9-1.0. Recent Labs    03/11/22 0850 03/11/22 0852 03/11/22 1452 03/13/22 0705 03/14/22 0607 05/19/22 0913 07/21/22 2200 07/21/22 2216 07/22/22 0514 07/23/22 0516  BUN 42* 41* 34* 17 15 24* 71* 77* 59* 46*  CREATININE 1.43* 1.43* 1.29* 1.18* 1.08* 0.97 1.53* 1.60* 1.30* 1.45*  -Hold diuretics -Continue IV  fluid -Recheck in the morning  Pancytopenia: Seems chronic and stable. -Continue monitoring -Outpatient follow-up  Essential hypertension: BP within acceptable range.  Pancreatic insufficiency: -Continue Creon.  Moderate persistent asthma: Stable. -Continue inhalers  Generalized weakness/physical deconditioning -PT/OT eval  Morbid obesity Body mass index is 40.43 kg/m. -Encourage lifestyle change to lose weight  Increased nutrient needs Nutrition Problem: Increased nutrient needs Etiology: wound healing Signs/Symptoms: estimated needs Interventions: Ensure Enlive (each supplement provides 350kcal and 20 grams of protein), Premier Protein, MVI, Juven, Education   DVT prophylaxis:  SCD's Start: 07/23/22 1303 SCDs Start: 07/22/22 0033  Code Status: Full code Family Communication: Friends at bedside Level of care: Telemetry Status is: Inpatient Remains inpatient appropriate because: Abdominal wound infection/cellulitis   Final disposition: Likely home once medically cleared Consultants:  General surgery  35 minutes with more than 50% spent in reviewing records, counseling patient/family and coordinating care.   Sch Meds:  Scheduled Meds:  ascorbic acid  500 mg Oral BID   feeding supplement  237 mL Oral Q24H   fenofibrate  54 mg Oral Q supper   FLUoxetine  40 mg Oral QHS   gabapentin  300 mg Oral TID   insulin aspart  0-9 Units Subcutaneous TID WC   insulin glargine-yfgn  10 Units Subcutaneous BID   lipase/protease/amylase  72,000 Units Oral TID WC   metoprolol tartrate  50 mg Oral TID   mometasone-formoterol  2 puff Inhalation BID   multivitamin with minerals  1 tablet Oral Daily   nutrition supplement (JUVEN)  1 packet Oral BID WC   pantoprazole  40 mg Oral Daily   propylthiouracil  50 mg Oral Daily   Ensure Max Protein  11 oz Oral BID   zinc sulfate  220 mg Oral Daily   Continuous Infusions:  cefTRIAXone (ROCEPHIN)  IV 1 g (07/22/22 2312)   lactated  ringers 20 mL/hr at 07/23/22 0938   vancomycin     PRN Meds:.acetaminophen **OR** acetaminophen, albuterol, fentaNYL (SUBLIMAZE) injection, melatonin, naLOXone (NARCAN)  injection, ondansetron (ZOFRAN) IV  Antimicrobials: Anti-infectives (From admission, onward)    Start     Dose/Rate Route Frequency Ordered Stop   07/23/22 2200  vancomycin (VANCOCIN) IVPB 1000 mg/200 mL premix  Status:  Discontinued        1,000 mg 200 mL/hr over 60 Minutes Intravenous Every 48 hours 07/21/22 2329 07/22/22 0812   07/23/22 2200  vancomycin (VANCOREADY) IVPB 1250 mg/250 mL        1,250 mg 166.7 mL/hr over 90 Minutes Intravenous Every 48 hours 07/22/22 1104     07/22/22 1000  linezolid (ZYVOX) IVPB 600 mg  Status:  Discontinued        600 mg 300 mL/hr over 60 Minutes Intravenous Every 12 hours 07/22/22 0813 07/22/22 0843   07/22/22 0045  cefTRIAXone (ROCEPHIN) 1 g in sodium chloride 0.9 % 100 mL IVPB        1 g 200 mL/hr over 30 Minutes Intravenous Every 24 hours 07/22/22 0034     07/21/22 2300  vancomycin (VANCOCIN) IVPB 1000 mg/200 mL premix        1,000 mg 200 mL/hr over 60 Minutes Intravenous  Once 07/21/22 2259 07/22/22 0013        I have personally reviewed the following labs and images: CBC: Recent Labs  Lab 07/21/22 2200 07/21/22 2216 07/22/22 0514 07/23/22 0516  WBC 1.9*  --  1.6* 2.6*  NEUTROABS 1.3*  --  1.0*  --   HGB 7.2* 7.1* 7.2* 7.7*  HCT 22.5* 21.0* 23.6* 24.9*  MCV 85.2  --  85.5 85.0  PLT 115*  --  104* 110*   BMP &GFR Recent Labs  Lab 07/21/22 2200 07/21/22 2216 07/21/22 2219 07/22/22 0514 07/23/22 0516  NA 134* 137  --  136 137  K 4.6 4.7  --  4.7 5.1  CL 98 99  --  100 103  CO2 26  --   --  28 25  GLUCOSE 226* 229*  --  209* 135*  BUN 71* 77*  --  59* 46*  CREATININE 1.53* 1.60*  --  1.30* 1.45*  CALCIUM 8.6*  --   --  8.7* 9.2  MG  --   --  2.3 2.4  --    Estimated Creatinine Clearance: 37 mL/min (A) (by C-G formula based on SCr of 1.45 mg/dL  (H)). Liver & Pancreas: Recent Labs  Lab 07/21/22 2200 07/22/22 0514  AST 11* 10*  ALT 9 8  ALKPHOS 49 47  BILITOT 0.5 0.6  PROT 7.1 6.9  ALBUMIN 3.0* 2.9*   No results for input(s): "LIPASE", "AMYLASE" in the last 168 hours. No results for input(s): "AMMONIA" in the last 168 hours. Diabetic: Recent Labs    07/21/22 2219  HGBA1C 6.8*   Recent Labs  Lab 07/22/22 1623 07/22/22 2046 07/23/22 0740 07/23/22 1002 07/23/22 1219  GLUCAP 94 200* 128* 125* 120*   Cardiac Enzymes: No results for input(s): "CKTOTAL", "CKMB", "CKMBINDEX", "TROPONINI" in the last 168 hours. No results for input(s): "PROBNP" in the last 8760 hours. Coagulation Profile: Recent Labs  Lab 07/21/22 2200  INR 1.2   Thyroid Function Tests: No results for input(s): "TSH", "T4TOTAL", "FREET4", "T3FREE", "THYROIDAB" in the last 72 hours. Lipid Profile: No results for input(s): "CHOL", "HDL", "LDLCALC", "TRIG", "CHOLHDL", "LDLDIRECT" in the last 72 hours. Anemia Panel: Recent Labs    07/22/22 0514  FERRITIN 100  TIBC 356  IRON 122  RETICCTPCT 0.5   Urine analysis:    Component Value Date/Time   COLORURINE STRAW (A) 07/21/2022 2225   APPEARANCEUR CLEAR 07/21/2022 2225   LABSPEC 1.012 07/21/2022 2225   PHURINE 6.0 07/21/2022 2225   GLUCOSEU 50 (A) 07/21/2022 2225   HGBUR NEGATIVE 07/21/2022 2225   BILIRUBINUR NEGATIVE 07/21/2022 2225   KETONESUR NEGATIVE 07/21/2022 2225   PROTEINUR NEGATIVE 07/21/2022 2225   UROBILINOGEN 0.2 07/20/2014 1707   NITRITE NEGATIVE 07/21/2022 2225   LEUKOCYTESUR TRACE (A) 07/21/2022 2225   Sepsis Labs: Invalid input(s): "PROCALCITONIN", "LACTICIDVEN"  Microbiology: Recent Results (from the past 240 hour(s))  Blood Culture (routine x 2)     Status: None (Preliminary result)   Collection Time: 07/21/22 10:00 PM   Specimen: BLOOD  Result Value Ref Range Status   Specimen Description   Final    BLOOD SITE NOT SPECIFIED Performed at [facility], [address], [place], [place] [address]    Special Requests   Final    BOTTLES DRAWN AEROBIC AND ANAEROBIC Blood Culture results may not be optimal due to an excessive volume of blood received in culture bottles Performed at [facility], [address] [place], [place]  [address]    Culture   Final    NO GROWTH 2 DAYS Performed at [facility], [address], [place], [place] [address]    Report Status PENDING  Incomplete  Blood Culture (routine x 2)     Status: None (Preliminary result)   Collection Time: 07/21/22 10:08 PM   Specimen: BLOOD  Result Value Ref Range Status   Specimen Description   Final    BLOOD SITE NOT SPECIFIED Performed at [facility], [address], [place], [place] [address]    Special Requests   Final    BOTTLES DRAWN AEROBIC AND ANAEROBIC Blood Culture results may not be optimal due to an excessive volume of blood received in culture bottles Performed at [facility], [address] [place], [place] [address]    Culture   Final    NO GROWTH 2 DAYS Performed at [facility], [address] [place], [place] [address]    Report Status PENDING  Incomplete  Surgical pcr screen     Status: None   Collection Time: 07/22/22  4:29 PM   Specimen: Nasal Mucosa; Nasal Swab  Result Value Ref Range Status   MRSA, PCR NEGATIVE NEGATIVE Final   Staphylococcus aureus NEGATIVE NEGATIVE Final    Comment: (NOTE) The Xpert SA Assay (FDA approved for NASAL specimens in patients 37 years of age and older), is one component of a comprehensive surveillance program. It is not intended to diagnose infection nor to guide or monitor treatment. Performed at [facility], [address], [place], [place] [address]     Radiology Studies: No results found.    Maddilynn Esperanza T. Shadonna Benedick [facility]  If 7PM-7AM, please contact night-coverage [email or web address] 07/23/2022, 2:59 PM

## 2022-07-23 NOTE — Anesthesia Preprocedure Evaluation (Addendum)
Anesthesia Evaluation  Patient identified by MRN, date of birth, ID band Patient awake    Reviewed: Allergy & Precautions, H&P , NPO status , Patient's Chart, lab work & pertinent test results  History of Anesthesia Complications (+) PONV and history of anesthetic complications  Airway Mallampati: III  TM Distance: >3 FB Neck ROM: Limited  Mouth opening: Limited Mouth Opening Comment: HX OF FRACTURED NECK C1- CAUSES SEVERE HEADACHES--LIMITED ROM NECK  Previous intubation note Preoxygenation: Pre-oxygenation with 100% oxygen Induction Type: IV induction Laryngoscope Size: Glidescope and 3 Grade View: Grade I Tube type: Oral Tube size: 7.0 mm Number of attempts: 1 Airway Equipment and Method: Stylet and Oral airway Placement Confirmation: ETT inserted through vocal cords under direct vision,  positive ETCO2 and breath sounds checked- equal and bilateral  Dental no notable dental hx. (+) Teeth Intact   Pulmonary neg pulmonary ROS   Pulmonary exam normal breath sounds clear to auscultation- rhonchi       Cardiovascular hypertension, Pt. on medications and Pt. on home beta blockers negative cardio ROS Normal cardiovascular exam+ Valvular Problems/Murmurs AI  Rhythm:Regular Rate:Normal  ECHO  1. Left ventricular ejection fraction, by estimation, is 55 to 60%. The  left ventricle has normal function. The left ventricle has no regional  wall motion abnormalities. Left ventricular diastolic parameters are  consistent with Grade I diastolic  dysfunction (impaired relaxation). Elevated left atrial pressure.   2. Right ventricular systolic function is normal. The right ventricular  size is normal.   3. Left atrial size was mildly dilated.   4. The mitral valve is normal in structure. Mild mitral valve  regurgitation.   5. The aortic valve is tricuspid. Aortic valve regurgitation is mild.   6. The inferior vena cava is normal in size  with greater than 50%  respiratory variability, suggesting right atrial pressure of 3 mmHg.     Neuro/Psych  PSYCHIATRIC DISORDERS Anxiety Depression Bipolar Disorder   negative neurological ROS  negative psych ROS   GI/Hepatic negative GI ROS, Neg liver ROS,GERD  Medicated,,  Endo/Other  negative endocrine ROSdiabetes, Insulin DependentHypothyroidism    Renal/GU negative Renal ROS  negative genitourinary   Musculoskeletal negative musculoskeletal ROS (+) Arthritis ,  Fibromyalgia -  Abdominal   Peds negative pediatric ROS (+)  Hematology negative hematology ROS (+) Blood dyscrasia, anemia   Anesthesia Other Findings   Reproductive/Obstetrics negative OB ROS                             Anesthesia Physical Anesthesia Plan  ASA: 4 and emergent  Anesthesia Plan: General   Post-op Pain Management: Toradol IV (intra-op)*, Ofirmev IV (intra-op)* and Minimal or no pain anticipated   Induction: Intravenous  PONV Risk Score and Plan: 4 or greater and Ondansetron and TIVA  Airway Management Planned: Oral ETT, LMA and Video Laryngoscope Planned  Additional Equipment: None  Intra-op Plan:   Post-operative Plan: Extubation in OR and Possible Post-op intubation/ventilation  Informed Consent: I have reviewed the patients History and Physical, chart, labs and discussed the procedure including the risks, benefits and alternatives for the proposed anesthesia with the patient or authorized representative who has indicated his/her understanding and acceptance.     Dental advisory given  Plan Discussed with: CRNA and Anesthesiologist  Anesthesia Plan Comments:         Anesthesia Quick Evaluation

## 2022-07-23 NOTE — Op Note (Signed)
Operative Note  Pre-operative Diagnosis:  necrotizing soft tissue infection of left groin  Post-operative Diagnosis:  same  Surgeon:  Darnell Level, MD  Assistant:  none   Procedure:  Excisional debridement of left groin soft tissue infection, 25x10x3 cm skin and subcutaneous tissue  Anesthesia:  general  Estimated Blood Loss:  20 cc  Drains: none         Specimen: excised tissue to pathology  Indications: Patient is a 72 year old female who developed a necrotizing soft tissue infection in the left groin while in [facility].  She had undergone debridement and intravenous antibiotics.  She transferred her care here to [facility] to be closer to home.  Patient now comes to the operating room for excisional debridement of a large necrotic eschar in the left groin.  Procedure:  The patient was seen in the pre-op holding area. The risks, benefits, complications, treatment options, and expected outcomes were previously discussed with the patient. The patient agreed with the proposed plan and has signed the informed consent form.  The patient was brought to the operating room by the surgical team, identified as Donah Driver and the procedure verified. A "time out" was completed and the above information confirmed.  Following administration of general anesthesia, the patient is positioned in low lithotomy and then prepped and draped in the usual aseptic fashion.  After ascertaining that an adequate level of anesthesia been achieved, the eschar overlying the left groin is excised using the electrocautery for hemostasis.  There is early granulation tissue around the edges.  There is no significant purulent drainage.  The tissue is excised around the entire edge of the eschar and into healthy underlying subcutaneous tissues.  Good hemostasis is achieved with the electrocautery.  The excised specimen measures 25 cm x 10 cm x 3 cm.  It is submitted to pathology for review.  Wound was irrigated  with saline.  Good hemostasis was achieved with the electrocautery.  Wound was packed with saline moistened Kerlix gauze.  This was covered with dry gauze dressings followed by ABD pads.  Patient was awakened from anesthesia and transferred to the recovery room.  The patient tolerated the procedure well.   Darnell Level, MD [facility] Surgery Office: [phone]

## 2022-07-23 NOTE — Evaluation (Signed)
Physical Therapy Evaluation Patient Details Name: [name] MRN: [id number] DOB: 05/20/1950 Today's Date: 07/23/2022  History of Present Illness  72 yo female admitted to hospital on 07/21/2022 due to worsening of cellulitis of abdominal wound with failure of OP PO ABX. Pt is currently scheduled for I and D of abdominal wall wound due to presence of necrotic tissue. Pt was found to have abn labs with HgB 7.7, CT of abdomen indicating cellulitis of the anterior inferior abdominal wall and no evidence for soft tissue gas or abscess and CXR negative for acute findings. Pt was recently hospitalized at [facility] 4/17-4/24 due to cellulitis and septic shock. Pt has PMH including but not limited to DM II, dCHF, HDL, R TKA, fibromyalgia, graves dz, asthma, HTN, HDL, OSA, pancytopenia and necrotizing fasciitis.  Clinical Impression    Pt admitted with above diagnosis.  Pt currently with functional limitations due to the deficits listed below (see PT Problem List). Pt underwent I and D prior to PT eval today. Pt in bed and agreeable to therapy intervention pt is motivated to increase OOB time and to return home. Pt required cues and min guard for supine <> sit. STS from EOB with min guard and cues and commode transfer at RW level with min guard and cues. Pt indicated need to void and during the course of toileting pts abdominal bandages which were held in by mesh undergarment proceeded to fall to floor, pts open wound actively bleeding and nursing staff made aware requesting PT assist pt to return to bed. Pt left in bed all needs in place. Pt indicated mild 3/10 abdominal pain and occasional L knee pain t/o eval. No reports of dizziness or SOB with pt having supplemental O2 donned when PT arrived, removed and at rest 96% and with exertion O2 saturation 93%.   Pt will benefit from acute skilled PT to increase their independence and safety with mobility to allow discharge.        Recommendations for follow up therapy  are one component of a multi-disciplinary discharge planning process, led by the attending physician.  Recommendations may be updated based on patient status, additional functional criteria and insurance authorization.  Follow Up Recommendations       Assistance Recommended at Discharge Intermittent Supervision/Assistance  Patient can return home with the following  A little help with walking and/or transfers;A little help with bathing/dressing/bathroom;Assistance with cooking/housework;Assist for transportation;Help with stairs or ramp for entrance    Equipment Recommendations None recommended by PT (DME in home setting)  Recommendations for Other Services       Functional Status Assessment Patient has had a recent decline in their functional status and demonstrates the ability to make significant improvements in function in a reasonable and predictable amount of time.     Precautions / Restrictions Precautions Precautions: Fall Restrictions Weight Bearing Restrictions: No      Mobility  Bed Mobility Overal bed mobility: Needs Assistance Bed Mobility: Supine to Sit, Sit to Supine     Supine to sit: Min guard Sit to supine: Min guard   General bed mobility comments: HOB elevated, use of bed rails and log roll technique    Transfers Overall transfer level: Needs assistance Equipment used: Rolling walker (2 wheels) Transfers: Sit to/from Stand Sit to Stand: Min guard           General transfer comment: cuse for proper UE placement    Ambulation/Gait Ambulation/Gait assistance: Min guard Gait Distance (Feet): 15 Feet (15 + 15  bed <> commode) Assistive device: Rolling walker (2 wheels) Gait Pattern/deviations: Step-to pattern, Wide base of support Gait velocity: decreased        Stairs            Wheelchair Mobility    Modified Rankin (Stroke Patients Only)       Balance Overall balance assessment: Needs assistance Sitting-balance support: Feet  supported Sitting balance-Leahy Scale: Good     Standing balance support: During functional activity, Reliant on assistive device for balance (static standing no UE support and posterior LOB with min A to recover) Standing balance-Leahy Scale: Fair                               Pertinent Vitals/Pain Pain Assessment Pain Assessment: 0-10 Pain Score: 3  Pain Location: abdomen and  knee Pain Descriptors / Indicators: Constant, Dull Pain Intervention(s): Limited activity within patient's tolerance, Monitored during session, Premedicated before session, Repositioned    Home Living Family/patient expects to be discharged to:: Private residence Living Arrangements: Spouse/significant other Available Help at Discharge: Family Type of Home: House Home Access: Stairs to enter Entrance Stairs-Rails:  (steps deep enough to place RW) Entrance Stairs-Number of Steps: 1+1   Home Layout: One level Home Equipment: Pharmacist, hospital (2 wheels);BSC/3in1;Rollator (4 wheels)      Prior Function Prior Level of Function : Needs assist       Physical Assist : ADLs (physical);Mobility (physical) Mobility (physical): Transfers (shower transfers)   Mobility Comments: rolator ADLs Comments: husband completed IADLs, meal prep and laundry tasks     Hand Dominance   Dominant Hand: Right    Extremity/Trunk Assessment        Lower Extremity Assessment Lower Extremity Assessment: Overall WFL for tasks assessed    Cervical / Trunk Assessment Cervical / Trunk Assessment: Other exceptions (head forward)  Communication   Communication: No difficulties  Cognition Arousal/Alertness: Awake/alert Behavior During Therapy: WFL for tasks assessed/performed Overall Cognitive Status: Within Functional Limits for tasks assessed                                          General Comments      Exercises     Assessment/Plan    PT Assessment Patient needs  continued PT services  PT Problem List Decreased strength;Decreased activity tolerance;Decreased balance;Decreased mobility;Decreased coordination;Decreased cognition;Pain       PT Treatment Interventions Gait training;Stair training;Functional mobility training;DME instruction;Therapeutic activities;Therapeutic exercise;Balance training;Neuromuscular re-education;Patient/family education;Modalities    PT Goals (Current goals can be found in the Care Plan section)  Acute Rehab PT Goals Patient Stated Goal: to be able to travel, get around better and go to the beach PT Goal Formulation: With patient Time For Goal Achievement: 08/06/22 Potential to Achieve Goals: Good    Frequency Min 1X/week     Co-evaluation               AM-PAC PT "6 Clicks" Mobility  Outcome Measure Help needed turning from your back to your side while in a flat bed without using bedrails?: None Help needed moving from lying on your back to sitting on the side of a flat bed without using bedrails?: A Little Help needed moving to and from a bed to a chair (including a wheelchair)?: A Little Help needed standing up from a chair using your arms (e.g.,  wheelchair or bedside chair)?: A Little Help needed to walk in hospital room?: A Little Help needed climbing 3-5 steps with a railing? : A Lot 6 Click Score: 18    End of Session Equipment Utilized During Treatment: Gait belt Activity Tolerance: Patient tolerated treatment well Patient left: in bed;with call bell/phone within reach;with nursing/sitter in room Nurse Communication: Mobility status;Other (comment) (need for abdominal wound dressing change) PT Visit Diagnosis: Unsteadiness on feet (R26.81);Other abnormalities of gait and mobility (R26.89);Muscle weakness (generalized) (M62.81);Difficulty in walking, not elsewhere classified (R26.2);Pain Pain - part of body:  (abdomen and L knee)    Time: 1610-9604 PT Time Calculation (min) (ACUTE ONLY): 42  min   Charges:   PT Evaluation $PT Eval Low Complexity: 1 Low PT Treatments $Gait Training: 8-22 mins $Therapeutic Activity: 8-22 mins         Rica Mote, PT   Jacqualyn Posey 07/23/2022, 4:13 PM

## 2022-07-24 ENCOUNTER — Encounter (HOSPITAL_COMMUNITY): Payer: Self-pay | Admitting: Surgery

## 2022-07-24 DIAGNOSIS — J4540 Moderate persistent asthma, uncomplicated: Secondary | ICD-10-CM | POA: Diagnosis not present

## 2022-07-24 DIAGNOSIS — E1142 Type 2 diabetes mellitus with diabetic polyneuropathy: Secondary | ICD-10-CM | POA: Diagnosis not present

## 2022-07-24 DIAGNOSIS — L03311 Cellulitis of abdominal wall: Secondary | ICD-10-CM | POA: Diagnosis not present

## 2022-07-24 DIAGNOSIS — D61818 Other pancytopenia: Secondary | ICD-10-CM | POA: Diagnosis not present

## 2022-07-24 LAB — CULTURE, BLOOD (ROUTINE X 2): Culture: NO GROWTH

## 2022-07-24 LAB — IRON AND TIBC
Iron: 45 ug/dL (ref 28–170)
Saturation Ratios: 13 % (ref 10.4–31.8)
TIBC: 357 ug/dL (ref 250–450)
UIBC: 312 ug/dL

## 2022-07-24 LAB — GLUCOSE, CAPILLARY
Glucose-Capillary: 174 mg/dL — ABNORMAL HIGH (ref 70–99)
Glucose-Capillary: 173 mg/dL — ABNORMAL HIGH (ref 70–99)
Glucose-Capillary: 158 mg/dL — ABNORMAL HIGH (ref 70–99)
Glucose-Capillary: 164 mg/dL — ABNORMAL HIGH (ref 70–99)

## 2022-07-24 LAB — CBC
HCT: 23.4 % — ABNORMAL LOW (ref 36.0–46.0)
Hemoglobin: 7.4 g/dL — ABNORMAL LOW (ref 12.0–15.0)
MCH: 26.5 pg (ref 26.0–34.0)
MCHC: 31.6 g/dL (ref 30.0–36.0)
MCV: 83.9 fL (ref 80.0–100.0)
Platelets: 106 10*3/uL — ABNORMAL LOW (ref 150–400)
RBC: 2.79 MIL/uL — ABNORMAL LOW (ref 3.87–5.11)
RDW: 13.7 % (ref 11.5–15.5)
WBC: 5.8 10*3/uL (ref 4.0–10.5)
nRBC: 0 % (ref 0.0–0.2)

## 2022-07-24 LAB — COMPREHENSIVE METABOLIC PANEL
ALT: 9 U/L (ref 0–44)
AST: 12 U/L — ABNORMAL LOW (ref 15–41)
Albumin: 3.2 g/dL — ABNORMAL LOW (ref 3.5–5.0)
Alkaline Phosphatase: 54 U/L (ref 38–126)
Anion gap: 10 (ref 5–15)
BUN: 48 mg/dL — ABNORMAL HIGH (ref 8–23)
CO2: 21 mmol/L — ABNORMAL LOW (ref 22–32)
Calcium: 8.9 mg/dL (ref 8.9–10.3)
Chloride: 100 mmol/L (ref 98–111)
Creatinine, Ser: 1.77 mg/dL — ABNORMAL HIGH (ref 0.44–1.00)
GFR, Estimated: 31 mL/min — ABNORMAL LOW (ref >60)
Glucose, Bld: 184 mg/dL — ABNORMAL HIGH (ref 70–99)
Potassium: 5 mmol/L (ref 3.5–5.1)
Sodium: 131 mmol/L — ABNORMAL LOW (ref 135–145)
Total Bilirubin: 0.5 mg/dL (ref 0.3–1.2)
Total Protein: 7.5 g/dL (ref 6.5–8.1)

## 2022-07-24 LAB — RETICULOCYTES
Immature Retic Fract: 16.3 % — ABNORMAL HIGH (ref 2.3–15.9)
RBC.: 2.75 MIL/uL — ABNORMAL LOW (ref 3.87–5.11)
Retic Count, Absolute: 11.3 10*3/uL — ABNORMAL LOW (ref 19.0–186.0)
Retic Ct Pct: 0.5 % (ref 0.4–3.1)

## 2022-07-24 LAB — FERRITIN: Ferritin: 167 ng/mL (ref 11–307)

## 2022-07-24 LAB — VITAMIN B12: Vitamin B-12: 1025 pg/mL — ABNORMAL HIGH (ref 180–914)

## 2022-07-24 LAB — FOLATE: Folate: 3.6 ng/mL — ABNORMAL LOW (ref >5.9)

## 2022-07-24 LAB — MAGNESIUM: Magnesium: 2 mg/dL (ref 1.7–2.4)

## 2022-07-24 MED ORDER — CEFAZOLIN SODIUM-DEXTROSE 2-4 GM/100ML-% IV SOLN
2.0000 g | Freq: Three times a day (TID) | INTRAVENOUS | Status: DC
  Administered 2022-07-24 – 2022-07-25 (×4): 2 g via INTRAVENOUS
  Filled 2022-07-24 (×4): qty 100

## 2022-07-24 MED ORDER — FOLIC ACID 1 MG PO TABS
1.0000 mg | ORAL_TABLET | Freq: Every day | ORAL | Status: DC
  Administered 2022-07-24 – 2022-07-30 (×7): 1 mg via ORAL
  Filled 2022-07-24 (×7): qty 1

## 2022-07-24 MED ORDER — ROPINIROLE HCL 1 MG PO TABS
2.0000 mg | ORAL_TABLET | Freq: Every day | ORAL | Status: DC
  Administered 2022-07-24 – 2022-07-29 (×6): 2 mg via ORAL
  Filled 2022-07-24 (×6): qty 2

## 2022-07-24 MED ORDER — ROPINIROLE HCL 1 MG PO TABS
2.0000 mg | ORAL_TABLET | Freq: Once | ORAL | Status: AC
  Administered 2022-07-24: 2 mg via ORAL
  Filled 2022-07-24: qty 2

## 2022-07-24 NOTE — Progress Notes (Signed)
Mobility Specialist - Progress Note   07/24/22 1044  Mobility  Activity Ambulated with assistance in hallway  Level of Assistance Standby assist, set-up cues, supervision of patient - no hands on  Assistive Device Front wheel walker  Distance Ambulated (ft) 200 ft  Range of Motion/Exercises Active  Activity Response Tolerated well  Mobility Referral Yes  $Mobility charge 1 Mobility   Pt received in chair and agreed to mobility, was standby for STS, pt had c/o soreness in abdominal area. Pt returned to chair with all needs met.  Marilynne Halsted Mobility Specialist

## 2022-07-24 NOTE — Progress Notes (Signed)
[name] [id number] 24-Nov-1950  CARE TEAM:  PCP: Rodrigo Ran, MD  Outpatient Care Team: Patient Care Team: Rodrigo Ran, MD as PCP - General (Internal Medicine) Heloise Purpura, MD as Consulting Physician (Urology) Rachael Fee, MD as Attending Physician (Gastroenterology) Cliffton Asters, MD (Inactive) as Consulting Physician (Infectious Diseases) Magrinat, Valentino Hue, MD (Inactive) as Consulting Physician (Oncology)  Inpatient Treatment Team: Treatment Team: Attending Provider: Almon Hercules, MD; Rounding Team: Armida Sans, MD; Consulting Physician: Bishop Limbo, MD; WOC Nurse: Vassie Loll, RN; Technician: Lyman Speller, NT; Technician: Merwyn Katos, NT; Respiratory Therapist: Nunzio Cobbs, RRT; Registered Nurse: Sherian Maroon, RN   Problem List:   Principal Problem:   Cellulitis of abdominal wall Active Problems:   DM2 (diabetes mellitus, type 2) (HCC)   Hyperlipidemia   AKI (acute kidney injury) (HCC)   Graves disease   Essential hypertension   Pancytopenia (HCC)   Acute prerenal azotemia   Moderate persistent asthma   Chronic diastolic CHF (congestive heart failure) (HCC)   1 Day Post-Op  07/23/2022  Operative Note  Pre-operative Diagnosis:  necrotizing soft tissue infection of left groin   Post-operative Diagnosis:  same   Surgeon:  Darnell Level, MD   Procedure:  Excisional debridement of left groin soft tissue infection, 25x10x3 cm skin and subcutaneous tissue    Assessment  Recovering  [facility] Stay = 2 days)  Plan:    Abdominal wall soft tissue infection s/p wide excision 07/23/2022. H/o of necrotizing fasciitis s/p debridements and grafting (2018-2019)   -Base wound looks clean.  Start wound VAC today.  Recheck on Monday.  If no purulence or decline then hopefully can transition with outpatient wound VAC if possible.   FEN: regular, NPO p MN ID: Vanc/Rocephin -MRSA screen negative on 07/22/2022.  Most likely can stop after  5-7 days after excision. VTE: okay for chemical prophylaxis from surgical standpoint   Dispo: To be determined.   DM CHF Pancytopenia HLD HTN Hypothyroidism Obesity OSA Fibromyalgia         I reviewed nursing notes, hospitalist notes, last 24 h vitals and pain scores, last 48 h intake and output, last 24 h labs and trends, and last 24 h imaging results. I have reviewed this patient's available data, including medical history, events of note, test results, etc as part of my evaluation.  A significant portion of that time was spent in counseling.  Care during the described time interval was provided by me.  This care required moderate level of medical decision making.  07/24/2022    Subjective: (Chief complaint)  Patient sitting up in chair.  Notes dressing falls off and leaking.  Nursing in room.  Pain mostly controlled.  Husband arriving in room.  Objective:  Vital signs:  Vitals:   07/23/22 1808 07/23/22 2102 07/23/22 2143 07/24/22 0526  BP: (!) 144/54 (!) 152/67  (!) 132/55  Pulse: 69 73  74  Resp: 14 18  16   Temp: 98.5 F (36.9 C) 99.1 F (37.3 C)  99.7 F (37.6 C)  TempSrc: Oral Oral  Oral  SpO2: 94% 95% 96% 98%  Weight:      Height:        Last BM Date : 07/23/22  Intake/Output   Yesterday:  05/03 0701 - 05/04 0700 In: 1913.4 [P.O.:480; I.V.:1083.3; IV Piggyback:350.1] Out: 1670 [Urine:1650; Blood:20] This shift:  No intake/output data recorded.  Bowel function:  Flatus: YES  BM:    Drain: (No drain)  Physical Exam:  General: Pt awake/alert in no acute distress.  Sitting up in chair.  Tired but nontoxic. Eyes: PERRL, normal EOM.  Sclera clear.  No icterus Neuro: CN II-XII intact w/o focal sensory/motor deficits. Lymph: No head/neck/groin lymphadenopathy Psych:  No delerium/psychosis/paranoia.  Oriented x 4 HENT: Normocephalic, Mucus membranes moist.  No thrush Neck: Supple, No tracheal deviation.  No obvious thyromegaly Chest:  No pain to chest wall compression.  Good respiratory excursion.  No audible wheezing CV:  Pulses intact.  Regular rhythm.  No major extremity edema MS: Normal AROM mjr joints.  No obvious deformity  Abdomen: Soft.  Nondistended.  Tenderness at lower quadrant wound only.  Large transverse incision involving half of her panniculus.  Rather superficial wound open.  25 x 10 cm max dimension.  No active bleeding.  No purulence or drainage.  No progressive cellulitis. .  No evidence of peritonitis.  No incarcerated hernias.  Ext:   No deformity.  No mjr edema.  No cyanosis Skin: No petechiae / purpurea.  No major sores.  Warm and dry    Results:   Cultures: Recent Results (from the past 720 hour(s))  Blood Culture (routine x 2)     Status: None (Preliminary result)   Collection Time: 07/21/22 10:00 PM   Specimen: BLOOD  Result Value Ref Range Status   Specimen Description   Final    BLOOD SITE NOT SPECIFIED Performed at [facility], [address] [place], [place] [address]    Special Requests   Final    BOTTLES DRAWN AEROBIC AND ANAEROBIC Blood Culture results may not be optimal due to an excessive volume of blood received in culture bottles Performed at [facility], [address] [place], [place] [address]    Culture   Final    NO GROWTH 2 DAYS Performed at [facility], [address], [place], [place] [address]    Report Status PENDING  Incomplete  Blood Culture (routine x 2)     Status: None (Preliminary result)   Collection Time: 07/21/22 10:08 PM   Specimen: BLOOD  Result Value Ref Range Status   Specimen Description   Final    BLOOD SITE NOT SPECIFIED Performed at [facility], [address], [place], [place] [address]    Special Requests   Final    BOTTLES DRAWN AEROBIC AND ANAEROBIC Blood Culture results may not be optimal due to an excessive volume of blood received in culture bottles Performed at  [facility], [address] [place], [place] [address]    Culture   Final    NO GROWTH 2 DAYS Performed at [facility], [address] [place], [place] [address]    Report Status PENDING  Incomplete  Surgical pcr screen     Status: None   Collection Time: 07/22/22  4:29 PM   Specimen: Nasal Mucosa; Nasal Swab  Result Value Ref Range Status   MRSA, PCR NEGATIVE NEGATIVE Final   Staphylococcus aureus NEGATIVE NEGATIVE Final    Comment: (NOTE) The Xpert SA Assay (FDA approved for NASAL specimens in patients 65 years of age and older), is one component of a comprehensive surveillance program. It is not intended to diagnose infection nor to guide or monitor treatment. Performed at [facility], [address], [place], [place] [address]     Labs: Results for orders placed or performed during the hospital encounter of 07/21/22 (from the  past 48 hour(s))  Glucose, capillary     Status: Abnormal   Collection Time: 07/22/22 11:36 AM  Result Value Ref Range   Glucose-Capillary 217 (H) 70 - 99 mg/dL    Comment: Glucose reference range applies only to samples taken after fasting for at least 8 hours.  Glucose, capillary     Status: None   Collection Time: 07/22/22  4:23 PM  Result Value Ref Range   Glucose-Capillary 94 70 - 99 mg/dL    Comment: Glucose reference range applies only to samples taken after fasting for at least 8 hours.  Surgical pcr screen     Status: None   Collection Time: 07/22/22  4:29 PM   Specimen: Nasal Mucosa; Nasal Swab  Result Value Ref Range   MRSA, PCR NEGATIVE NEGATIVE   Staphylococcus aureus NEGATIVE NEGATIVE    Comment: (NOTE) The Xpert SA Assay (FDA approved for NASAL specimens in patients 22 years of age and older), is one component of a comprehensive surveillance program. It is not intended to diagnose infection nor to guide or monitor treatment. Performed at [facility], [address], [place], [place] [address]   Glucose, capillary     Status: Abnormal   Collection Time: 07/22/22  8:46 PM  Result Value Ref Range   Glucose-Capillary 200 (H) 70 - 99 mg/dL    Comment: Glucose reference range applies only to samples taken after fasting for at least 8 hours.  CBC     Status: Abnormal   Collection Time: 07/23/22  5:16 AM  Result Value Ref Range   WBC 2.6 (L) 4.0 - 10.5 K/uL   RBC 2.93 (L) 3.87 - 5.11 MIL/uL   Hemoglobin 7.7 (L) 12.0 - 15.0 g/dL   HCT 47.8 (L) 29.5 - 62.1 %   MCV 85.0 80.0 - 100.0 fL   MCH 26.3 26.0 - 34.0 pg   MCHC 30.9 30.0 - 36.0 g/dL   RDW 30.8 65.7 - 84.6 %   Platelets 110 (L) 150 - 400 K/uL    Comment: Immature Platelet Fraction may be clinically indicated, consider ordering this additional test [id number]    nRBC 0.0 0.0 - 0.2 %    Comment: Performed at [facility], [address], [place], [place] [address]  Basic metabolic panel     Status: Abnormal   Collection Time: 07/23/22  5:16 AM  Result Value Ref Range   Sodium 137 135 - 145 mmol/L   Potassium 5.1 3.5 - 5.1 mmol/L   Chloride 103 98 - 111 mmol/L   CO2 25 22 - 32 mmol/L   Glucose, Bld 135 (H) 70 - 99 mg/dL    Comment: Glucose reference range applies only to samples taken after fasting for at least 8 hours.   BUN 46 (H) 8 - 23 mg/dL   Creatinine, Ser 0.10 (H) 0.44 - 1.00 mg/dL   Calcium 9.2 8.9 - 27.2 mg/dL   GFR, Estimated 39 (L) >60 mL/min    Comment: (NOTE) Calculated using the CKD-EPI Creatinine Equation (2021)    Anion gap 9 5 - 15    Comment: Performed at [facility], [address], [place], [place] [address]  Glucose, capillary     Status: Abnormal   Collection Time: 07/23/22  7:40 AM  Result Value Ref Range   Glucose-Capillary 128 (H) 70 - 99 mg/dL    Comment: Glucose reference range applies only to samples taken after fasting for at least 8 hours.  Prepare RBC (crossmatch)     Status: None   Collection Time: 07/23/22   9:00 AM  Result Value Ref Range   Order Confirmation      ORDER PROCESSED BY BLOOD BANK Performed at [facility], [address], [place], [place] [address]   Glucose, capillary     Status: Abnormal   Collection Time: 07/23/22 10:02 AM  Result Value Ref Range   Glucose-Capillary 125 (H) 70 - 99 mg/dL    Comment: Glucose reference range applies only to samples taken after fasting for at least 8 hours.   Comment 1 Notify RN   Glucose, capillary     Status: Abnormal   Collection Time: 07/23/22 12:19 PM  Result Value Ref Range   Glucose-Capillary 120 (H) 70 - 99 mg/dL    Comment: Glucose reference range applies only to samples taken after fasting for at least 8 hours.  Glucose, capillary     Status: Abnormal   Collection Time: 07/23/22  4:13 PM  Result Value Ref Range   Glucose-Capillary 230 (H) 70 - 99 mg/dL    Comment: Glucose reference range applies only to samples taken after fasting for at least 8 hours.  Glucose, capillary     Status: Abnormal   Collection Time: 07/23/22  9:08 PM  Result Value Ref Range   Glucose-Capillary 239 (H) 70 - 99 mg/dL    Comment: Glucose reference range applies only to samples taken after fasting for at least 8 hours.  Magnesium     Status: None   Collection Time: 07/24/22  6:50 AM  Result Value Ref Range   Magnesium 2.0 1.7 - 2.4 mg/dL    Comment: Performed at [facility], [address], [place], [place] [address]  Comprehensive metabolic panel     Status: Abnormal   Collection Time: 07/24/22  6:50 AM  Result Value Ref Range   Sodium 131 (L) 135 - 145 mmol/L   Potassium 5.0 3.5 - 5.1 mmol/L   Chloride 100 98 - 111 mmol/L   CO2 21 (L) 22 - 32 mmol/L   Glucose, Bld 184 (H) 70 - 99 mg/dL    Comment: Glucose reference range applies only to samples taken after fasting for at least 8 hours.   BUN 48 (H) 8 - 23 mg/dL   Creatinine, Ser 0.98 (H) 0.44 - 1.00 mg/dL   Calcium 8.9 8.9 - 11.9 mg/dL   Total Protein  7.5 6.5 - 8.1 g/dL   Albumin 3.2 (L) 3.5 - 5.0 g/dL   AST 12 (L) 15 - 41 U/L   ALT 9 0 - 44 U/L   Alkaline Phosphatase 54 38 - 126 U/L   Total Bilirubin 0.5 0.3 - 1.2 mg/dL   GFR, Estimated 31 (L) >60 mL/min    Comment: (NOTE) Calculated using the CKD-EPI Creatinine Equation (2021)    Anion gap 10 5 - 15    Comment: Performed at [facility], [address], [place], [place] [address]  CBC     Status: Abnormal   Collection Time: 07/24/22  6:50 AM  Result Value Ref Range   WBC 5.8 4.0 - 10.5 K/uL   RBC 2.79 (L) 3.87 - 5.11 MIL/uL   Hemoglobin 7.4 (L) 12.0 - 15.0 g/dL   HCT 95.6 (L) 21.3 - 08.6 %   MCV 83.9 80.0 - 100.0 fL   MCH 26.5 26.0 - 34.0 pg   MCHC 31.6 30.0 - 36.0 g/dL   RDW 57.8 46.9 -  15.5 %   Platelets 106 (L) 150 - 400 K/uL    Comment: Immature Platelet Fraction may be clinically indicated, consider ordering this additional test [id number]    nRBC 0.0 0.0 - 0.2 %    Comment: Performed at [facility], [address], [place], [place] [address]  Reticulocytes     Status: Abnormal   Collection Time: 07/24/22  6:50 AM  Result Value Ref Range   Retic Ct Pct 0.5 0.4 - 3.1 %   RBC. 2.75 (L) 3.87 - 5.11 MIL/uL   Retic Count, Absolute 11.3 (L) 19.0 - 186.0 K/uL   Immature Retic Fract 16.3 (H) 2.3 - 15.9 %    Comment: Performed at [facility], [address] [place], [place] [address]    Imaging / Studies: No results found.  Medications / Allergies: per chart  Antibiotics: Anti-infectives (From admission, onward)    Start     Dose/Rate Route Frequency Ordered Stop   07/24/22 0815  ceFAZolin (ANCEF) IVPB 2g/100 mL premix        2 g 200 mL/hr over 30 Minutes Intravenous Every 8 hours 07/24/22 0717 07/31/22 0559   07/23/22 2200  vancomycin (VANCOCIN) IVPB 1000 mg/200 mL premix  Status:  Discontinued        1,000 mg 200 mL/hr over 60 Minutes Intravenous Every 48 hours 07/21/22 2329 07/22/22 0812   07/23/22  2200  vancomycin (VANCOREADY) IVPB 1250 mg/250 mL  Status:  Discontinued        1,250 mg 166.7 mL/hr over 90 Minutes Intravenous Every 48 hours 07/22/22 1104 07/24/22 0717   07/22/22 1000  linezolid (ZYVOX) IVPB 600 mg  Status:  Discontinued        600 mg 300 mL/hr over 60 Minutes Intravenous Every 12 hours 07/22/22 0813 07/22/22 0843   07/22/22 0045  cefTRIAXone (ROCEPHIN) 1 g in sodium chloride 0.9 % 100 mL IVPB  Status:  Discontinued        1 g 200 mL/hr over 30 Minutes Intravenous Every 24 hours 07/22/22 0034 07/24/22 0717   07/21/22 2300  vancomycin (VANCOCIN) IVPB 1000 mg/200 mL premix        1,000 mg 200 mL/hr over 60 Minutes Intravenous  Once 07/21/22 2259 07/22/22 0013         Note: Portions of this report may have been transcribed using voice recognition software. Every effort was made to ensure accuracy; however, inadvertent computerized transcription errors may be present.   Any transcriptional errors that result from this process are unintentional.    Ardeth Sportsman, MD, FACS, MASCRS Esophageal, Gastrointestinal & Colorectal Surgery Robotic and Minimally Invasive Surgery  [facility] [address], [address] [place], [place] [address] [phone] Fax [phone] Main  CONTACT INFORMATION:  Weekday (9AM-5PM): Call [facility] main office at [phone]  Weeknight (5PM-9AM) or Weekend/Holiday: Check [email or web address] (password " [email or web address]") for General Surgery [facility] coverage  (Please, do not use SecureChat as it is not reliable communication to reach operating surgeons for immediate patient care given surgeries/outpatient duties/clinic/cross-coverage/off post-call which would lead to a delay in care.  Epic staff messaging available for outptient concerns, but may not be answered for 48 hours or more).     07/24/2022  7:53 AM

## 2022-07-24 NOTE — Progress Notes (Signed)
PROGRESS NOTE  [name] QIO:[id number] DOB: [birth year]   PCP: Rodrigo Ran, MD  Patient is from: Home  DOA: 07/21/2022 LOS: 2  Chief complaints Chief Complaint  Patient presents with   Wound Infection     Brief Narrative / Interim history: 72 year old F with PMH of diastolic CHF, DM-2, HTN, HLD, pancytopenia and morbid obesity presenting with worsening abdominal wound with surrounding cellulitis.  She was recently hospitalized at [facility] and discharged on Zyvox about a week prior to presentation here.  Labs significant for pancytopenia.  CT abdomen and pelvis with evidence of cellulitis without subcutaneous gas or abscess.  She was started on IV antibiotics.  General surgery consulted.  She underwent excision debridement on 07/23/2022.    Antibiotics de-escalated to IV Ancef on 5/4.  Surgery applying wound VAC    Subjective: Seen and examined earlier this morning.  No major events overnight of this morning.  Feels sore from surgical site.  No significant pain.  Denies chest pain or shortness of breath.  Family member at bedside.  Objective: Vitals:   07/23/22 2143 07/24/22 0526 07/24/22 0759 07/24/22 0934  BP:  (!) 132/55  (!) 138/48  Pulse:  74  71  Resp:  16  16  Temp:  99.7 F (37.6 C)  99.5 F (37.5 C)  TempSrc:  Oral  Oral  SpO2: 96% 98% 93% 94%  Weight:      Height:        Examination:  GENERAL: No apparent distress.  Nontoxic. HEENT: MMM.  Vision and hearing grossly intact.  NECK: Supple.  No apparent JVD.  RESP:  No IWOB.  Fair aeration bilaterally. CVS:  RRR. Heart sounds normal.  ABD/GI/GU: BS+. Abd soft.  Surgical packing and dressing.  MSK/EXT:  Moves extremities. No apparent deformity. No edema.  SKIN: no apparent skin lesion or wound NEURO: Awake, alert and oriented appropriately.  No apparent focal neuro deficit. PSYCH: Calm. Normal affect.   Procedures:  5/3-excisional debridement of abdominal wound  Microbiology summarized: MRSA PCR screen  nonreactive Blood cultures NGTD  Assessment and plan: Principal Problem:   Cellulitis of abdominal wall Active Problems:   DM2 (diabetes mellitus, type 2) (HCC)   Hyperlipidemia   AKI (acute kidney injury) (HCC)   Graves disease   Essential hypertension   Pancytopenia (HCC)   Acute prerenal azotemia   Moderate persistent asthma   Chronic diastolic CHF (congestive heart failure) (HCC)  Abdominal wall wound with cellulitis: Hospitalized at [facility] for septic shock related to abdominal sterile cellulitis from 4/17-4/24.  Wound culture positive for MSSA, rare Enterococcus hirae and rare Streptococcus pyogenes.  Discharged on p.o. linezolid and outpatient wound care.  Presents with worsening cellulitis despite antibiotics and wound care.  CT abdomen and pelvis concerning for cellulitis without subcutaneous gas or abscess.  Vital stable.  No fever.  She has pancytopenia.  Blood cultures NGTD.  MRSA PCR screen nonreactive. -S/p excisional debridement by surgery on 5/3 -De-escalate antibiotics to IV Ancef. -Wound VAC per surgery  Controlled IDDM-2 with hyperglycemia and hyperlipidemia: A1c 6.8% Recent Labs  Lab 07/23/22 1002 07/23/22 1219 07/23/22 1613 07/23/22 2108 07/24/22 0810  GLUCAP 125* 120* 230* 239* 174*  -Continue current insulin regimen  Chronic diastolic CHF: TTE in 12/23 with LVEF of 55 to 60%, G1-DD.  On p.o. Lasix 40 mg daily at home.  Denies cardiopulmonary symptoms.  Appears euvolemic on exam but difficult due to body habitus. -Continue holding Lasix due to AKI. -Monitor off IV fluid.  AKI/azotemia: Baseline Cr 0.9-1.0. Recent Labs    03/11/22 0852 03/11/22 1452 03/13/22 0705 03/14/22 0607 05/19/22 0913 07/21/22 2200 07/21/22 2216 07/22/22 0514 07/23/22 0516 07/24/22 0650  BUN 41* 34* 17 15 24* 71* 77* 59* 46* 48*  CREATININE 1.43* 1.29* 1.18* 1.08* 0.97 1.53* 1.60* 1.30* 1.45* 1.77*  -Continue holding diuretics -Discontinued vancomycin. -Recheck in the  morning -Consider workup if worse  Pancytopenia: Seems chronic .  Leukopenia resolved.  Anemia and thrombocytopenia stable.  Anemia panel significant for folic acid deficiency. -Start folic acid supplementation -Continue monitoring -Outpatient follow-up  Essential hypertension: BP within acceptable range.  Pancreatic insufficiency: -Continue Creon.  Moderate persistent asthma: Stable. -Continue inhalers  Generalized weakness/physical deconditioning -PT/OT eval  Morbid obesity Body mass index is 40.43 kg/m. Nutrition Problem: Increased nutrient needs Etiology: wound healing Signs/Symptoms: estimated needs Interventions: Ensure Enlive (each supplement provides 350kcal and 20 grams of protein), Premier Protein, MVI, Juven, Education   DVT prophylaxis:  SCD's Start: 07/23/22 1303 SCDs Start: 07/22/22 0033  Code Status: Full code Family Communication: Friends at bedside Level of care: Telemetry Status is: Inpatient Remains inpatient appropriate because: Abdominal wound infection/cellulitis and AKI   Final disposition: Likely home once AKI improves and medically cleared from surgical standpoint Consultants:  General surgery  35 minutes with more than 50% spent in reviewing records, counseling patient/family and coordinating care.   Sch Meds:  Scheduled Meds:  ascorbic acid  500 mg Oral BID   feeding supplement  237 mL Oral Q24H   fenofibrate  54 mg Oral Q supper   FLUoxetine  40 mg Oral QHS   folic acid  1 mg Oral Daily   gabapentin  300 mg Oral TID   insulin aspart  0-9 Units Subcutaneous TID WC   insulin glargine-yfgn  10 Units Subcutaneous BID   lipase/protease/amylase  72,000 Units Oral TID WC   metoprolol tartrate  50 mg Oral TID   mometasone-formoterol  2 puff Inhalation BID   multivitamin with minerals  1 tablet Oral Daily   nutrition supplement (JUVEN)  1 packet Oral BID WC   pantoprazole  40 mg Oral Daily   propylthiouracil  50 mg Oral Daily   Ensure  Max Protein  11 oz Oral BID   rOPINIRole  2 mg Oral Once   Followed by   Melene Muller ON 07/25/2022] rOPINIRole  2 mg Oral QHS   zinc sulfate  220 mg Oral Daily   Continuous Infusions:   ceFAZolin (ANCEF) IV 2 g (07/24/22 0830)   lactated ringers 20 mL/hr at 07/23/22 1732   PRN Meds:.acetaminophen **OR** acetaminophen, albuterol, fentaNYL (SUBLIMAZE) injection, melatonin, naLOXone (NARCAN)  injection, ondansetron (ZOFRAN) IV  Antimicrobials: Anti-infectives (From admission, onward)    Start     Dose/Rate Route Frequency Ordered Stop   07/24/22 0815  ceFAZolin (ANCEF) IVPB 2g/100 mL premix        2 g 200 mL/hr over 30 Minutes Intravenous Every 8 hours 07/24/22 0717 07/31/22 0559   07/23/22 2200  vancomycin (VANCOCIN) IVPB 1000 mg/200 mL premix  Status:  Discontinued        1,000 mg 200 mL/hr over 60 Minutes Intravenous Every 48 hours 07/21/22 2329 07/22/22 0812   07/23/22 2200  vancomycin (VANCOREADY) IVPB 1250 mg/250 mL  Status:  Discontinued        1,250 mg 166.7 mL/hr over 90 Minutes Intravenous Every 48 hours 07/22/22 1104 07/24/22 0717   07/22/22 1000  linezolid (ZYVOX) IVPB 600 mg  Status:  Discontinued  600 mg 300 mL/hr over 60 Minutes Intravenous Every 12 hours 07/22/22 0813 07/22/22 0843   07/22/22 0045  cefTRIAXone (ROCEPHIN) 1 g in sodium chloride 0.9 % 100 mL IVPB  Status:  Discontinued        1 g 200 mL/hr over 30 Minutes Intravenous Every 24 hours 07/22/22 0034 07/24/22 0717   07/21/22 2300  vancomycin (VANCOCIN) IVPB 1000 mg/200 mL premix        1,000 mg 200 mL/hr over 60 Minutes Intravenous  Once 07/21/22 2259 07/22/22 0013        I have personally reviewed the following labs and images: CBC: Recent Labs  Lab 07/21/22 2200 07/21/22 2216 07/22/22 0514 07/23/22 0516 07/24/22 0650  WBC 1.9*  --  1.6* 2.6* 5.8  NEUTROABS 1.3*  --  1.0*  --   --   HGB 7.2* 7.1* 7.2* 7.7* 7.4*  HCT 22.5* 21.0* 23.6* 24.9* 23.4*  MCV 85.2  --  85.5 85.0 83.9  PLT 115*  --   104* 110* 106*   BMP &GFR Recent Labs  Lab 07/21/22 2200 07/21/22 2216 07/21/22 2219 07/22/22 0514 07/23/22 0516 07/24/22 0650  NA 134* 137  --  136 137 131*  K 4.6 4.7  --  4.7 5.1 5.0  CL 98 99  --  100 103 100  CO2 26  --   --  28 25 21*  GLUCOSE 226* 229*  --  209* 135* 184*  BUN 71* 77*  --  59* 46* 48*  CREATININE 1.53* 1.60*  --  1.30* 1.45* 1.77*  CALCIUM 8.6*  --   --  8.7* 9.2 8.9  MG  --   --  2.3 2.4  --  2.0   Estimated Creatinine Clearance: 30.3 mL/min (A) (by C-G formula based on SCr of 1.77 mg/dL (H)). Liver & Pancreas: Recent Labs  Lab 07/21/22 2200 07/22/22 0514 07/24/22 0650  AST 11* 10* 12*  ALT 9 8 9   ALKPHOS 49 47 54  BILITOT 0.5 0.6 0.5  PROT 7.1 6.9 7.5  ALBUMIN 3.0* 2.9* 3.2*   No results for input(s): "LIPASE", "AMYLASE" in the last 168 hours. No results for input(s): "AMMONIA" in the last 168 hours. Diabetic: Recent Labs    07/21/22 2219  HGBA1C 6.8*   Recent Labs  Lab 07/23/22 1002 07/23/22 1219 07/23/22 1613 07/23/22 2108 07/24/22 0810  GLUCAP 125* 120* 230* 239* 174*   Cardiac Enzymes: No results for input(s): "CKTOTAL", "CKMB", "CKMBINDEX", "TROPONINI" in the last 168 hours. No results for input(s): "PROBNP" in the last 8760 hours. Coagulation Profile: Recent Labs  Lab 07/21/22 2200  INR 1.2   Thyroid Function Tests: No results for input(s): "TSH", "T4TOTAL", "FREET4", "T3FREE", "THYROIDAB" in the last 72 hours. Lipid Profile: No results for input(s): "CHOL", "HDL", "LDLCALC", "TRIG", "CHOLHDL", "LDLDIRECT" in the last 72 hours. Anemia Panel: Recent Labs    07/22/22 0514 07/24/22 0650  VITAMINB12  --  1,025*  FOLATE  --  3.6*  FERRITIN 100 167  TIBC 356 357  IRON 122 45  RETICCTPCT 0.5 0.5   Urine analysis:    Component Value Date/Time   COLORURINE STRAW (A) 07/21/2022 2225   APPEARANCEUR CLEAR 07/21/2022 2225   LABSPEC 1.012 07/21/2022 2225   PHURINE 6.0 07/21/2022 2225   GLUCOSEU 50 (A) 07/21/2022  2225   HGBUR NEGATIVE 07/21/2022 2225   BILIRUBINUR NEGATIVE 07/21/2022 2225   KETONESUR NEGATIVE 07/21/2022 2225   PROTEINUR NEGATIVE 07/21/2022 2225   UROBILINOGEN 0.2 07/20/2014 1707  NITRITE NEGATIVE 07/21/2022 2225   LEUKOCYTESUR TRACE (A) 07/21/2022 2225   Sepsis Labs: Invalid input(s): "PROCALCITONIN", "LACTICIDVEN"  Microbiology: Recent Results (from the past 240 hour(s))  Blood Culture (routine x 2)     Status: None (Preliminary result)   Collection Time: 07/21/22 10:00 PM   Specimen: BLOOD  Result Value Ref Range Status   Specimen Description   Final    BLOOD SITE NOT SPECIFIED Performed at [facility], [address], [place], [place] [address]    Special Requests   Final    BOTTLES DRAWN AEROBIC AND ANAEROBIC Blood Culture results may not be optimal due to an excessive volume of blood received in culture bottles Performed at [facility], [address], [place], [place] [address]    Culture   Final    NO GROWTH 3 DAYS Performed at [facility], [address], [place], [place] [address]    Report Status PENDING  Incomplete  Blood Culture (routine x 2)     Status: None (Preliminary result)   Collection Time: 07/21/22 10:08 PM   Specimen: BLOOD  Result Value Ref Range Status   Specimen Description   Final    BLOOD SITE NOT SPECIFIED Performed at [facility], [address], [place], [place] [address]    Special Requests   Final    BOTTLES DRAWN AEROBIC AND ANAEROBIC Blood Culture results may not be optimal due to an excessive volume of blood received in culture bottles Performed at [facility], [address], [place], [place] [address]    Culture   Final    NO GROWTH 3 DAYS Performed at [facility], [address] [place], [place] [address]    Report Status PENDING  Incomplete  Surgical pcr screen     Status: None   Collection Time: 07/22/22  4:29 PM   Specimen:  Nasal Mucosa; Nasal Swab  Result Value Ref Range Status   MRSA, PCR NEGATIVE NEGATIVE Final   Staphylococcus aureus NEGATIVE NEGATIVE Final    Comment: (NOTE) The Xpert SA Assay (FDA approved for NASAL specimens in patients 70 years of age and older), is one component of a comprehensive surveillance program. It is not intended to diagnose infection nor to guide or monitor treatment. Performed at [facility], [address] [place], [place] [address]     Radiology Studies: No results found.    Chanci Ojala T. Joeleen Wortley [facility]  If 7PM-7AM, please contact night-coverage [email or web address] 07/24/2022, 11:24 AM

## 2022-07-25 ENCOUNTER — Inpatient Hospital Stay (HOSPITAL_COMMUNITY): Payer: Medicare Other

## 2022-07-25 DIAGNOSIS — L03311 Cellulitis of abdominal wall: Secondary | ICD-10-CM | POA: Diagnosis not present

## 2022-07-25 LAB — RENAL FUNCTION PANEL
Albumin: 3.1 g/dL — ABNORMAL LOW (ref 3.5–5.0)
Anion gap: 10 (ref 5–15)
BUN: 48 mg/dL — ABNORMAL HIGH (ref 8–23)
CO2: 24 mmol/L (ref 22–32)
Calcium: 9 mg/dL (ref 8.9–10.3)
Chloride: 99 mmol/L (ref 98–111)
Creatinine, Ser: 2.07 mg/dL — ABNORMAL HIGH (ref 0.44–1.00)
GFR, Estimated: 25 mL/min — ABNORMAL LOW (ref >60)
Glucose, Bld: 152 mg/dL — ABNORMAL HIGH (ref 70–99)
Phosphorus: 3.6 mg/dL (ref 2.5–4.6)
Potassium: 4.9 mmol/L (ref 3.5–5.1)
Sodium: 133 mmol/L — ABNORMAL LOW (ref 135–145)

## 2022-07-25 LAB — CBC
HCT: 23.2 % — ABNORMAL LOW (ref 36.0–46.0)
Hemoglobin: 7.3 g/dL — ABNORMAL LOW (ref 12.0–15.0)
MCH: 26.4 pg (ref 26.0–34.0)
MCHC: 31.5 g/dL (ref 30.0–36.0)
MCV: 84.1 fL (ref 80.0–100.0)
Platelets: 101 10*3/uL — ABNORMAL LOW (ref 150–400)
RBC: 2.76 MIL/uL — ABNORMAL LOW (ref 3.87–5.11)
RDW: 14.2 % (ref 11.5–15.5)
WBC: 5.8 10*3/uL (ref 4.0–10.5)
nRBC: 0.3 % — ABNORMAL HIGH (ref 0.0–0.2)

## 2022-07-25 LAB — TYPE AND SCREEN
ABO/RH(D): A NEG
Unit division: 0
Unit division: 0

## 2022-07-25 LAB — GLUCOSE, CAPILLARY
Glucose-Capillary: 140 mg/dL — ABNORMAL HIGH (ref 70–99)
Glucose-Capillary: 149 mg/dL — ABNORMAL HIGH (ref 70–99)
Glucose-Capillary: 142 mg/dL — ABNORMAL HIGH (ref 70–99)
Glucose-Capillary: 251 mg/dL — ABNORMAL HIGH (ref 70–99)

## 2022-07-25 LAB — BPAM RBC: Unit Type and Rh: 600

## 2022-07-25 LAB — CULTURE, BLOOD (ROUTINE X 2)

## 2022-07-25 LAB — MAGNESIUM: Magnesium: 2 mg/dL (ref 1.7–2.4)

## 2022-07-25 MED ORDER — CEFAZOLIN SODIUM-DEXTROSE 2-4 GM/100ML-% IV SOLN
2.0000 g | Freq: Two times a day (BID) | INTRAVENOUS | Status: DC
  Administered 2022-07-25 – 2022-07-28 (×6): 2 g via INTRAVENOUS
  Filled 2022-07-25 (×6): qty 100

## 2022-07-25 MED ORDER — HEPARIN SODIUM (PORCINE) 5000 UNIT/ML IJ SOLN: 5000.0000 [IU] | Freq: Three times a day (TID) | INTRAMUSCULAR | Status: DC

## 2022-07-25 MED ORDER — HYDRALAZINE HCL 20 MG/ML IJ SOLN: 10.0000 mg | Freq: Four times a day (QID) | INTRAMUSCULAR | Status: DC | PRN

## 2022-07-25 MED ORDER — HYDRALAZINE HCL 50 MG PO TABS: 50.0000 mg | ORAL_TABLET | Freq: Four times a day (QID) | ORAL | Status: DC | PRN

## 2022-07-25 NOTE — Progress Notes (Signed)
[name] [id number] 12-Jun-1950  CARE TEAM:  PCP: Rodrigo Ran, MD  Outpatient Care Team: Patient Care Team: Rodrigo Ran, MD as PCP - General (Internal Medicine) Heloise Purpura, MD as Consulting Physician (Urology) Rachael Fee, MD as Attending Physician (Gastroenterology) Cliffton Asters, MD (Inactive) as Consulting Physician (Infectious Diseases) Magrinat, Valentino Hue, MD (Inactive) as Consulting Physician (Oncology)  Inpatient Treatment Team: Treatment Team: Attending Provider: Gillis Santa, MD; Rounding Team: Armida Sans, MD; Consulting Physician: Montez Morita, Md, MD; WOC Nurse: Vassie Loll, RN; Technician: Lyman Speller, NT; Physical Therapist: Carmina Miller, PT; Technician: Merwyn Katos, NT; Utilization Review: Bess Kinds, RN; Registered Nurse: Sherian Maroon, RN   Problem List:   Principal Problem:   Cellulitis of abdominal wall Active Problems:   DM2 (diabetes mellitus, type 2) (HCC)   Hyperlipidemia   AKI (acute kidney injury) (HCC)   Graves disease   Essential hypertension   Pancytopenia (HCC)   Acute prerenal azotemia   Moderate persistent asthma   Chronic diastolic CHF (congestive heart failure) (HCC)   2 Days Post-Op  07/23/2022  Operative Note  Pre-operative Diagnosis:  necrotizing soft tissue infection of left groin   Post-operative Diagnosis:  same   Surgeon:  Darnell Level, MD   Procedure:  Excisional debridement of left groin soft tissue infection, 25x10x3 cm skin and subcutaneous tissue    Assessment  Recovering  [facility] Stay = 3 days)  Plan:    Abdominal wall soft tissue infection s/p wide excision 07/23/2022. H/o of necrotizing fasciitis s/p debridements and grafting (2018-2019)   -Continue wound VAC today.  Recheck on Monday.  If no purulence or decline then hopefully can transition with outpatient wound VAC if possible.   FEN: regular, NPO p MN ID: Vanc/Rocephin -MRSA screen negative on 07/22/2022.  Surgical  standpoint, can stop 5 days after resection = 07/28/2022.  Can convert to oral antibiotics if discharged sooner.   VTE: okay for chemical prophylaxis from surgical standpoint   Dispo: To be determined.   DM CHF Pancytopenia HLD HTN Hypothyroidism Obesity OSA Fibromyalgia         I reviewed nursing notes, hospitalist notes, last 24 h vitals and pain scores, last 48 h intake and output, last 24 h labs and trends, and last 24 h imaging results. I have reviewed this patient's available data, including medical history, events of note, test results, etc as part of my evaluation.  A significant portion of that time was spent in counseling.  Care during the described time interval was provided by me.  This care required moderate level of medical decision making.  07/25/2022    Subjective: (Chief complaint)  Patient sitting up in chair.  Notes dressing falls off and leaking.  Nursing in room.  Pain mostly controlled.  Husband arriving in room.  Objective:  Vital signs:  Vitals:   07/24/22 2208 07/24/22 2211 07/25/22 0500 07/25/22 0510  BP:    (!) 162/83  Pulse:    67  Resp:    20  Temp:    99.8 F (37.7 C)  TempSrc:    Oral  SpO2: (!) 89% 93%  99%  Weight:   92.9 kg   Height:        Last BM Date : 07/24/22  Intake/Output   Yesterday:  05/04 0701 - 05/05 0700 In: 1430.2 [P.O.:600; I.V.:477.3; IV Piggyback:352.9] Out: 1200 [Urine:1200] This shift:  No intake/output data recorded.  Bowel function:  Flatus: YES  BM:  Drain: (No drain)   Physical Exam:  General: Pt awake/alert in no acute distress.  Sitting up in chair.  Tired but nontoxic. Eyes: PERRL, normal EOM.  Sclera clear.  No icterus Neuro: CN II-XII intact w/o focal sensory/motor deficits. Lymph: No head/neck/groin lymphadenopathy Psych:  No delerium/psychosis/paranoia.  Oriented x 4 HENT: Normocephalic, Mucus membranes moist.  No thrush Neck: Supple, No tracheal deviation.  No obvious  thyromegaly Chest: No pain to chest wall compression.  Good respiratory excursion.  No audible wheezing CV:  Pulses intact.  Regular rhythm.  No major extremity edema MS: Normal AROM mjr joints.  No obvious deformity  Abdomen: Soft.  Nondistended.  Tenderness at lower quadrant wound only.  Large transverse incision involving half of her panniculus.  Rather superficial wound open.  25 x 10 cm max dimension.  No active bleeding.  No purulence or drainage.  No progressive cellulitis. .  No evidence of peritonitis.  No incarcerated hernias.  Ext:   No deformity.  No mjr edema.  No cyanosis Skin: No petechiae / purpurea.  No major sores.  Warm and dry    Results:   Cultures: Recent Results (from the past 720 hour(s))  Blood Culture (routine x 2)     Status: None (Preliminary result)   Collection Time: 07/21/22 10:00 PM   Specimen: BLOOD  Result Value Ref Range Status   Specimen Description   Final    BLOOD SITE NOT SPECIFIED Performed at [facility], [address], [place], [place] [address]    Special Requests   Final    BOTTLES DRAWN AEROBIC AND ANAEROBIC Blood Culture results may not be optimal due to an excessive volume of blood received in culture bottles Performed at [facility], [address] [place], [place] [address]    Culture   Final    NO GROWTH 3 DAYS Performed at [facility], [address] [place], [place] [address]    Report Status PENDING  Incomplete  Blood Culture (routine x 2)     Status: None (Preliminary result)   Collection Time: 07/21/22 10:08 PM   Specimen: BLOOD  Result Value Ref Range Status   Specimen Description   Final    BLOOD SITE NOT SPECIFIED Performed at [facility], [address], [place], [place] [address]    Special Requests   Final    BOTTLES DRAWN AEROBIC AND ANAEROBIC Blood Culture results may not be optimal due to an excessive volume of blood received in culture  bottles Performed at [facility], [address] [place], [place] [address]    Culture   Final    NO GROWTH 3 DAYS Performed at [facility], [address], [place], [place] [address]    Report Status PENDING  Incomplete  Surgical pcr screen     Status: None   Collection Time: 07/22/22  4:29 PM   Specimen: Nasal Mucosa; Nasal Swab  Result Value Ref Range Status   MRSA, PCR NEGATIVE NEGATIVE Final   Staphylococcus aureus NEGATIVE NEGATIVE Final    Comment: (NOTE) The Xpert SA Assay (FDA approved for NASAL specimens in patients 32 years of age and older), is one component of a comprehensive surveillance program. It is not intended to diagnose infection nor to guide or monitor treatment. Performed at [facility], [address], [place], [place] [address]     Labs: Results for orders placed or performed during the hospital  encounter of 07/21/22 (from the past 48 hour(s))  Prepare RBC (crossmatch)     Status: None   Collection Time: 07/23/22  9:00 AM  Result Value Ref Range   Order Confirmation      ORDER PROCESSED BY BLOOD BANK Performed at [facility], [address] [place], [place] [address]   Glucose, capillary     Status: Abnormal   Collection Time: 07/23/22 10:02 AM  Result Value Ref Range   Glucose-Capillary 125 (H) 70 - 99 mg/dL    Comment: Glucose reference range applies only to samples taken after fasting for at least 8 hours.   Comment 1 Notify RN   Glucose, capillary     Status: Abnormal   Collection Time: 07/23/22 12:19 PM  Result Value Ref Range   Glucose-Capillary 120 (H) 70 - 99 mg/dL    Comment: Glucose reference range applies only to samples taken after fasting for at least 8 hours.  Glucose, capillary     Status: Abnormal   Collection Time: 07/23/22  4:13 PM  Result Value Ref Range   Glucose-Capillary 230 (H) 70 - 99 mg/dL    Comment: Glucose reference range applies only to samples  taken after fasting for at least 8 hours.  Glucose, capillary     Status: Abnormal   Collection Time: 07/23/22  9:08 PM  Result Value Ref Range   Glucose-Capillary 239 (H) 70 - 99 mg/dL    Comment: Glucose reference range applies only to samples taken after fasting for at least 8 hours.  Magnesium     Status: None   Collection Time: 07/24/22  6:50 AM  Result Value Ref Range   Magnesium 2.0 1.7 - 2.4 mg/dL    Comment: Performed at [facility], [address] [place], [place] [address]  Comprehensive metabolic panel     Status: Abnormal   Collection Time: 07/24/22  6:50 AM  Result Value Ref Range   Sodium 131 (L) 135 - 145 mmol/L   Potassium 5.0 3.5 - 5.1 mmol/L   Chloride 100 98 - 111 mmol/L   CO2 21 (L) 22 - 32 mmol/L   Glucose, Bld 184 (H) 70 - 99 mg/dL    Comment: Glucose reference range applies only to samples taken after fasting for at least 8 hours.   BUN 48 (H) 8 - 23 mg/dL   Creatinine, Ser 0.98 (H) 0.44 - 1.00 mg/dL   Calcium 8.9 8.9 - 11.9 mg/dL   Total Protein 7.5 6.5 - 8.1 g/dL   Albumin 3.2 (L) 3.5 - 5.0 g/dL   AST 12 (L) 15 - 41 U/L   ALT 9 0 - 44 U/L   Alkaline Phosphatase 54 38 - 126 U/L   Total Bilirubin 0.5 0.3 - 1.2 mg/dL   GFR, Estimated 31 (L) >60 mL/min    Comment: (NOTE) Calculated using the CKD-EPI Creatinine Equation (2021)    Anion gap 10 5 - 15    Comment: Performed at [facility], [address] [place], [place] [address]  CBC     Status: Abnormal   Collection Time: 07/24/22  6:50 AM  Result Value Ref Range   WBC 5.8 4.0 - 10.5 K/uL   RBC 2.79 (L) 3.87 - 5.11 MIL/uL   Hemoglobin 7.4 (L) 12.0 - 15.0 g/dL   HCT 95.6 (L) 21.3 - 08.6 %   MCV 83.9 80.0 - 100.0 fL   MCH 26.5 26.0 - 34.0 pg   MCHC 31.6 30.0 -  36.0 g/dL   RDW 09.8 11.9 - 14.7 %   Platelets 106 (L) 150 - 400 K/uL    Comment: Immature Platelet Fraction may be clinically indicated, consider ordering this additional test [id number]    nRBC 0.0  0.0 - 0.2 %    Comment: Performed at [facility], [address] [place], [place] [address]  Vitamin B12     Status: Abnormal   Collection Time: 07/24/22  6:50 AM  Result Value Ref Range   Vitamin B-12 1,025 (H) 180 - 914 pg/mL    Comment: (NOTE) This assay is not validated for testing neonatal or myeloproliferative syndrome specimens for Vitamin B12 levels. Performed at [facility], [address], [place], [place] [address]   Folate     Status: Abnormal   Collection Time: 07/24/22  6:50 AM  Result Value Ref Range   Folate 3.6 (L) >5.9 ng/mL    Comment: Performed at [facility], [address] [place], [place] [address]  Iron and TIBC     Status: None   Collection Time: 07/24/22  6:50 AM  Result Value Ref Range   Iron 45 28 - 170 ug/dL   TIBC 413 244 - 010 ug/dL   Saturation Ratios 13 10.4 - 31.8 %   UIBC 312 ug/dL    Comment: Performed at [facility], [address], [place], [place] [address]  Ferritin     Status: None   Collection Time: 07/24/22  6:50 AM  Result Value Ref Range   Ferritin 167 11 - 307 ng/mL    Comment: Performed at [facility], [address], [place], [place] [address]  Reticulocytes     Status: Abnormal   Collection Time: 07/24/22  6:50 AM  Result Value Ref Range   Retic Ct Pct 0.5 0.4 - 3.1 %   RBC. 2.75 (L) 3.87 - 5.11 MIL/uL   Retic Count, Absolute 11.3 (L) 19.0 - 186.0 K/uL   Immature Retic Fract 16.3 (H) 2.3 - 15.9 %    Comment: Performed at [facility], [address], [place], [place] [address]  Glucose, capillary     Status: Abnormal   Collection Time: 07/24/22  8:10 AM  Result Value Ref Range   Glucose-Capillary 174 (H) 70 - 99 mg/dL    Comment: Glucose reference range applies only to samples taken after fasting for at least 8 hours.  Glucose, capillary     Status: Abnormal   Collection Time: 07/24/22 12:01 PM  Result  Value Ref Range   Glucose-Capillary 173 (H) 70 - 99 mg/dL    Comment: Glucose reference range applies only to samples taken after fasting for at least 8 hours.  Glucose, capillary     Status: Abnormal   Collection Time: 07/24/22  5:17 PM  Result Value Ref Range   Glucose-Capillary 158 (H) 70 - 99 mg/dL    Comment: Glucose reference range applies only to samples taken after fasting for at least 8 hours.  Glucose, capillary     Status: Abnormal   Collection Time: 07/24/22  9:32 PM  Result Value Ref Range   Glucose-Capillary 164 (H) 70 - 99 mg/dL    Comment: Glucose reference range applies only to samples taken after fasting for at least 8 hours.  Renal function panel     Status: Abnormal   Collection Time: 07/25/22  5:06 AM  Result Value Ref Range   Sodium 133 (L) 135 -  145 mmol/L   Potassium 4.9 3.5 - 5.1 mmol/L   Chloride 99 98 - 111 mmol/L   CO2 24 22 - 32 mmol/L   Glucose, Bld 152 (H) 70 - 99 mg/dL    Comment: Glucose reference range applies only to samples taken after fasting for at least 8 hours.   BUN 48 (H) 8 - 23 mg/dL   Creatinine, Ser 1.61 (H) 0.44 - 1.00 mg/dL   Calcium 9.0 8.9 - 09.6 mg/dL   Phosphorus 3.6 2.5 - 4.6 mg/dL   Albumin 3.1 (L) 3.5 - 5.0 g/dL   GFR, Estimated 25 (L) >60 mL/min    Comment: (NOTE) Calculated using the CKD-EPI Creatinine Equation (2021)    Anion gap 10 5 - 15    Comment: Performed at [facility], [address], [place], [place] [address]  Magnesium     Status: None   Collection Time: 07/25/22  5:06 AM  Result Value Ref Range   Magnesium 2.0 1.7 - 2.4 mg/dL    Comment: Performed at [facility], [address], [place], [place] [address]  CBC     Status: Abnormal   Collection Time: 07/25/22  5:06 AM  Result Value Ref Range   WBC 5.8 4.0 - 10.5 K/uL   RBC 2.76 (L) 3.87 - 5.11 MIL/uL   Hemoglobin 7.3 (L) 12.0 - 15.0 g/dL   HCT 14.7 (L) 82.9 - 56.2 %   MCV 84.1 80.0 - 100.0 fL   MCH 26.4 26.0 - 34.0  pg   MCHC 31.5 30.0 - 36.0 g/dL   RDW 13.0 86.5 - 78.4 %   Platelets 101 (L) 150 - 400 K/uL   nRBC 0.3 (H) 0.0 - 0.2 %    Comment: Performed at [facility], [address], [place], [place] [address]  Glucose, capillary     Status: Abnormal   Collection Time: 07/25/22  7:58 AM  Result Value Ref Range   Glucose-Capillary 140 (H) 70 - 99 mg/dL    Comment: Glucose reference range applies only to samples taken after fasting for at least 8 hours.    Imaging / Studies: No results found.  Medications / Allergies: per chart  Antibiotics: Anti-infectives (From admission, onward)    Start     Dose/Rate Route Frequency Ordered Stop   07/24/22 0815  ceFAZolin (ANCEF) IVPB 2g/100 mL premix        2 g 200 mL/hr over 30 Minutes Intravenous Every 8 hours 07/24/22 0717 07/31/22 0559   07/23/22 2200  vancomycin (VANCOCIN) IVPB 1000 mg/200 mL premix  Status:  Discontinued        1,000 mg 200 mL/hr over 60 Minutes Intravenous Every 48 hours 07/21/22 2329 07/22/22 0812   07/23/22 2200  vancomycin (VANCOREADY) IVPB 1250 mg/250 mL  Status:  Discontinued        1,250 mg 166.7 mL/hr over 90 Minutes Intravenous Every 48 hours 07/22/22 1104 07/24/22 0717   07/22/22 1000  linezolid (ZYVOX) IVPB 600 mg  Status:  Discontinued        600 mg 300 mL/hr over 60 Minutes Intravenous Every 12 hours 07/22/22 0813 07/22/22 0843   07/22/22 0045  cefTRIAXone (ROCEPHIN) 1 g in sodium chloride 0.9 % 100 mL IVPB  Status:  Discontinued        1 g 200 mL/hr over 30 Minutes Intravenous Every 24 hours 07/22/22 0034 07/24/22 0717   07/21/22 2300  vancomycin (VANCOCIN) IVPB 1000 mg/200 mL premix  1,000 mg 200 mL/hr over 60 Minutes Intravenous  Once 07/21/22 2259 07/22/22 0013         Note: Portions of this report may have been transcribed using voice recognition software. Every effort was made to ensure accuracy; however, inadvertent computerized transcription errors may be present.   Any  transcriptional errors that result from this process are unintentional.    Ardeth Sportsman, MD, FACS, MASCRS Esophageal, Gastrointestinal & Colorectal Surgery Robotic and Minimally Invasive Surgery  [facility] [address], [address] [place], [place] [address] [phone] Fax [phone] Main  CONTACT INFORMATION:  Weekday (9AM-5PM): Call [facility] main office at [phone]  Weeknight (5PM-9AM) or Weekend/Holiday: Check [email or web address] (password " [email or web address]") for General Surgery [facility] coverage  (Please, do not use SecureChat as it is not reliable communication to reach operating surgeons for immediate patient care given surgeries/outpatient duties/clinic/cross-coverage/off post-call which would lead to a delay in care.  Epic staff messaging available for outptient concerns, but may not be answered for 48 hours or more).     07/25/2022  8:15 AM

## 2022-07-25 NOTE — Progress Notes (Signed)
[facility] Progress Note  Patient: [name]    ZOX:[id number]  DOA: 07/21/2022     Date of Service: the patient was seen and examined on 07/25/2022  Chief Complaint  Patient presents with   Wound Infection   Brief hospital course: 72 year old F with PMH of diastolic CHF, DM-2, HTN, HLD, pancytopenia and morbid obesity presenting with worsening abdominal wound with surrounding cellulitis.  She was recently hospitalized at [facility] and discharged on Zyvox about a week prior to presentation here.  Labs significant for pancytopenia.  CT abdomen and pelvis with evidence of cellulitis without subcutaneous gas or abscess.  She was started on IV antibiotics.  General surgery consulted.  She underwent excision debridement on 07/23/2022.     Antibiotics de-escalated to IV Ancef on 5/4.  Surgery applying wound VAC   Assessment and Plan:  Abdominal wall wound with cellulitis: Hospitalized at [facility] for septic shock related to abdominal sterile cellulitis from 4/17-4/24.  Wound culture positive for MSSA, rare Enterococcus hirae and rare Streptococcus pyogenes.  Discharged on p.o. linezolid and outpatient wound care.  Presents with worsening cellulitis despite antibiotics and wound care.  CT abdomen and pelvis concerning for cellulitis without subcutaneous gas or abscess.  Vital stable.  No fever.  She has pancytopenia.  Blood cultures NGTD.  MRSA PCR screen nonreactive. -S/p excisional debridement by surgery on 5/3 -De-escalate antibiotics to IV Ancef. -Wound VAC per surgery   Controlled IDDM-2 with hyperglycemia and hyperlipidemia: A1c 6.8% -Continue current insulin regimen   Chronic diastolic CHF: TTE in 12/23 with LVEF of 55 to 60%, G1-DD.  On p.o. Lasix 40 mg daily at home.  Denies cardiopulmonary symptoms.  Appears euvolemic on exam but difficult due to body habitus. -Continue holding Lasix due to AKI. -Monitor off IV fluid.   AKI/azotemia: Baseline Cr 0.9-1.0. -Continue holding  diuretics -Discontinued vancomycin. Creatinine 1.3--2.07 gradually getting worse Bladder scan rule out urinary retention Follow renal sonogram Avoid nephrotoxic medication, use renally dose medications Started IV fluid for hydration    Pancytopenia: Seems chronic .  Leukopenia resolved.  Anemia and thrombocytopenia stable.  Anemia panel significant for folic acid deficiency. -Start folic acid supplementation -Continue monitoring -Outpatient follow-up 5/5 Hb 7.3, continue to monitor hemoglobin and transfuse if hemoglobin below 7.  Folic acid deficiency, folate level 3.6, started folic acid 1 mg p.o. daily.  Follow with PCP to repeat folic acid level after 3 months.    Essential hypertension:  Continue metoprolol 50 mg p.o. 3 times daily    Pancreatic insufficiency: -Continue Creon.   Moderate persistent asthma: Stable. -Continue inhalers   Generalized weakness/physical deconditioning -PT/OT eval   Pancreatic cyst, incidental finding, patient was advised to follow with PCP to repeat MRI pancreas after 1 year.    Diet: Heart healthy and carb modified DVT Prophylaxis: SCD, pharmacological prophylaxis contraindicated due to thrombocytopenia    Advance goals of care discussion: Full code  Family Communication: family was present at bedside, at the time of interview.  The pt provided permission to discuss medical plan with the family. Opportunity was given to ask question and all questions were answered satisfactorily.   Disposition:  Pt is from Home, admitted with cellulitis of abdominal wall s/p I&D and wound VAC placement, still has wound VAC and on IV antibiotics, which precludes a safe discharge. Discharge to home w/HH vs SNF TBD afterr PT/OT eval, when stable and cleared by general surgery..  Subjective: No significant events overnight, patient still has abdominal pain but it is  well-controlled at rest, it hurts more when she moves around or somebody touches it.  Denies  any chest pain or palpitations, no shortness of breath, no any other active issues.  Physical Exam: General: NAD, lying comfortably Appear in no distress, affect appropriate Eyes: PERRLA ENT: Oral Mucosa Clear, moist  Neck: no JVD,  Cardiovascular: S1 and S2 Present, no Murmur,  Respiratory: good respiratory effort, Bilateral Air entry equal and Decreased, no Crackles, no wheezes Abdomen: Bowel Sound present, Soft, lower abd wall erythema and tenderness, wound VAC attached. Skin: no rashes Extremities: no Pedal edema, no calf tenderness Neurologic: without any new focal findings Gait not checked due to patient safety concerns  Vitals:   07/24/22 2211 07/25/22 0500 07/25/22 0510 07/25/22 0843  BP:   (!) 162/83   Pulse:   67   Resp:   20   Temp:   99.8 F (37.7 C)   TempSrc:   Oral   SpO2: 93%  99% 91%  Weight:  92.9 kg    Height:        Intake/Output Summary (Last 24 hours) at 07/25/2022 1306 Last data filed at 07/25/2022 1100 Gross per 24 hour  Intake 1310.23 ml  Output 1200 ml  Net 110.23 ml   Filed Weights   07/21/22 2112 07/25/22 0500  Weight: 93.9 kg 92.9 kg    Data Reviewed: I have personally reviewed and interpreted daily labs, tele strips, imagings as discussed above. I reviewed all nursing notes, pharmacy notes, vitals, pertinent old records I have discussed plan of care as described above with RN and patient/family.  CBC: Recent Labs  Lab 07/21/22 2200 07/21/22 2216 07/22/22 0514 07/23/22 0516 07/24/22 0650 07/25/22 0506  WBC 1.9*  --  1.6* 2.6* 5.8 5.8  NEUTROABS 1.3*  --  1.0*  --   --   --   HGB 7.2* 7.1* 7.2* 7.7* 7.4* 7.3*  HCT 22.5* 21.0* 23.6* 24.9* 23.4* 23.2*  MCV 85.2  --  85.5 85.0 83.9 84.1  PLT 115*  --  104* 110* 106* 101*   Basic Metabolic Panel: Recent Labs  Lab 07/21/22 2200 07/21/22 2216 07/21/22 2219 07/22/22 0514 07/23/22 0516 07/24/22 0650 07/25/22 0506  NA 134* 137  --  136 137 131* 133*  K 4.6 4.7  --  4.7 5.1 5.0  4.9  CL 98 99  --  100 103 100 99  CO2 26  --   --  28 25 21* 24  GLUCOSE 226* 229*  --  209* 135* 184* 152*  BUN 71* 77*  --  59* 46* 48* 48*  CREATININE 1.53* 1.60*  --  1.30* 1.45* 1.77* 2.07*  CALCIUM 8.6*  --   --  8.7* 9.2 8.9 9.0  MG  --   --  2.3 2.4  --  2.0 2.0  PHOS  --   --   --   --   --   --  3.6    Studies: No results found.  Scheduled Meds:  ascorbic acid  500 mg Oral BID   feeding supplement  237 mL Oral Q24H   fenofibrate  54 mg Oral Q supper   FLUoxetine  40 mg Oral QHS   folic acid  1 mg Oral Daily   gabapentin  300 mg Oral TID   insulin aspart  0-9 Units Subcutaneous TID WC   insulin glargine-yfgn  10 Units Subcutaneous BID   lipase/protease/amylase  72,000 Units Oral TID WC   metoprolol tartrate  50  mg Oral TID   mometasone-formoterol  2 puff Inhalation BID   multivitamin with minerals  1 tablet Oral Daily   nutrition supplement (JUVEN)  1 packet Oral BID WC   pantoprazole  40 mg Oral Daily   propylthiouracil  50 mg Oral Daily   Ensure Max Protein  11 oz Oral BID   rOPINIRole  2 mg Oral QHS   zinc sulfate  220 mg Oral Daily   Continuous Infusions:   ceFAZolin (ANCEF) IV     lactated ringers 75 mL/hr at 07/25/22 1301   PRN Meds: acetaminophen **OR** acetaminophen, albuterol, fentaNYL (SUBLIMAZE) injection, melatonin, naLOXone (NARCAN)  injection, ondansetron (ZOFRAN) IV  Time spent: 50 minutes  Author: Gillis Santa. MD [facility] 07/25/2022 1:06 PM  To reach On-call, see care teams to locate the attending and reach out to them via [email or web address]. If 7PM-7AM, please contact night-coverage If you still have difficulty reaching the attending provider, please page the [facility] (Director on Call) for [facility] on amion for assistance.

## 2022-07-25 NOTE — Progress Notes (Signed)
PHARMACY NOTE:  ANTIMICROBIAL RENAL DOSAGE ADJUSTMENT  Current antimicrobial regimen includes a mismatch between antimicrobial dosage and estimated renal function.  As per policy approved by the Pharmacy & Therapeutics and Medical Executive Committees, the antimicrobial dosage will be adjusted accordingly.  Current antimicrobial dosage:  Ancef 2 gm q8h  Indication: cellulitis  Renal Function:  Estimated Creatinine Clearance: 25.7 mL/min (A) (by C-G formula based on SCr of 2.07 mg/dL (H)). []      On intermittent HD, scheduled: []      On CRRT    Antimicrobial dosage has been changed to:  Ancef 2 gm q12h  Additional comments: consider change to Keflex PO   Thank you for allowing pharmacy to be a part of this patient's care.  Herby Abraham, Pharm.D Use secure chat for questions 07/25/2022 8:25 AM

## 2022-07-26 DIAGNOSIS — L03311 Cellulitis of abdominal wall: Secondary | ICD-10-CM | POA: Diagnosis not present

## 2022-07-26 LAB — BASIC METABOLIC PANEL
Anion gap: 8 (ref 5–15)
BUN: 45 mg/dL — ABNORMAL HIGH (ref 8–23)
CO2: 25 mmol/L (ref 22–32)
Calcium: 8.8 mg/dL — ABNORMAL LOW (ref 8.9–10.3)
Chloride: 100 mmol/L (ref 98–111)
Creatinine, Ser: 1.7 mg/dL — ABNORMAL HIGH (ref 0.44–1.00)
GFR, Estimated: 32 mL/min — ABNORMAL LOW (ref >60)
Glucose, Bld: 152 mg/dL — ABNORMAL HIGH (ref 70–99)
Potassium: 4.4 mmol/L (ref 3.5–5.1)
Sodium: 133 mmol/L — ABNORMAL LOW (ref 135–145)

## 2022-07-26 LAB — GLUCOSE, CAPILLARY
Glucose-Capillary: 151 mg/dL — ABNORMAL HIGH (ref 70–99)
Glucose-Capillary: 170 mg/dL — ABNORMAL HIGH (ref 70–99)
Glucose-Capillary: 168 mg/dL — ABNORMAL HIGH (ref 70–99)
Glucose-Capillary: 186 mg/dL — ABNORMAL HIGH (ref 70–99)

## 2022-07-26 LAB — CBC
HCT: 22.9 % — ABNORMAL LOW (ref 36.0–46.0)
Hemoglobin: 7.3 g/dL — ABNORMAL LOW (ref 12.0–15.0)
MCH: 26.5 pg (ref 26.0–34.0)
MCHC: 31.9 g/dL (ref 30.0–36.0)
MCV: 83.3 fL (ref 80.0–100.0)
Platelets: 119 10*3/uL — ABNORMAL LOW (ref 150–400)
RBC: 2.75 MIL/uL — ABNORMAL LOW (ref 3.87–5.11)
RDW: 14.5 % (ref 11.5–15.5)
WBC: 3.6 10*3/uL — ABNORMAL LOW (ref 4.0–10.5)
nRBC: 0.8 % — ABNORMAL HIGH (ref 0.0–0.2)

## 2022-07-26 LAB — SURGICAL PATHOLOGY

## 2022-07-26 LAB — MAGNESIUM: Magnesium: 1.9 mg/dL (ref 1.7–2.4)

## 2022-07-26 LAB — PHOSPHORUS: Phosphorus: 3.7 mg/dL (ref 2.5–4.6)

## 2022-07-26 LAB — CULTURE, BLOOD (ROUTINE X 2)

## 2022-07-26 NOTE — Consult Note (Addendum)
WOC Nurse Consult Note: Surgical PA at the bedside to assess wound appearance during the first post-op dressing change.  Pt was medicated for pain prior to the procedure but it was still very painful and she had tears in her eyes. It will be difficult to tolerate at home if only on PO pain meds. Abd with full thickness wound to lower abd.upper groin just above labia, pale red-grey, small amt bloody drainage, 11X17X3cm. Applied 2 barrier rings around lower wound edges to attempt to maintain a seal, then 2 PIECES black foam, one tucked into deeper area on left lower abd, Cont suction on at .   WOC team will plan to change again on Wed.  Thank-you,  Cammie Mcgee MSN, RN, CWOCN, [place], CNS [phone]

## 2022-07-26 NOTE — Progress Notes (Signed)
3 Days Post-Op  Subjective: No acute issues. Afebrile, vitals stable.   Objective: Vital signs in last 24 hours: Temp:  [98.4 F (36.9 C)-98.7 F (37.1 C)] 98.7 F (37.1 C) (05/06 0503) Pulse Rate:  [64-76] 64 (05/06 0503) Resp:  [16-17] 17 (05/06 0503) BP: (138-152)/(44-59) 152/59 (05/06 0503) SpO2:  [87 %-98 %] 98 % (05/06 0503) Weight:  [92.5 kg] 92.5 kg (05/06 0500) Last BM Date : 07/24/22  Intake/Output from previous day: 05/05 0701 - 05/06 0700 In: 2592.1 [P.O.:1180; I.V.:1240.5; IV Piggyback:171.6] Out: 1125 [Urine:1100; Drains:25] Intake/Output this shift: No intake/output data recorded.  PE: General: resting comfortably, NAD Neuro: alert and oriented, no focal deficits Resp: normal work of breathing on room air Abdomen: soft, nondistended, wound vac in place with serosanguinous drainage Extremities: warm and well-perfused   Lab Results:  Recent Labs    07/25/22 0506 07/26/22 0458  WBC 5.8 3.6*  HGB 7.3* 7.3*  HCT 23.2* 22.9*  PLT 101* 119*   BMET Recent Labs    07/25/22 0506 07/26/22 0458  NA 133* 133*  K 4.9 4.4  CL 99 100  CO2 24 25  GLUCOSE 152* 152*  BUN 48* 45*  CREATININE 2.07* 1.70*  CALCIUM 9.0 8.8*   PT/INR No results for input(s): "LABPROT", "INR" in the last 72 hours. CMP     Component Value Date/Time   NA 133 (L) 07/26/2022 0458   K 4.4 07/26/2022 0458   CL 100 07/26/2022 0458   CO2 25 07/26/2022 0458   GLUCOSE 152 (H) 07/26/2022 0458   BUN 45 (H) 07/26/2022 0458   CREATININE 1.70 (H) 07/26/2022 0458   CREATININE 0.97 05/19/2022 0913   CREATININE 0.85 10/11/2018 1609   CALCIUM 8.8 (L) 07/26/2022 0458   PROT 7.5 07/24/2022 0650   ALBUMIN 3.1 (L) 07/25/2022 0506   AST 12 (L) 07/24/2022 0650   AST 14 (L) 05/19/2022 0913   ALT 9 07/24/2022 0650   ALT 10 05/19/2022 0913   ALKPHOS 54 07/24/2022 0650   BILITOT 0.5 07/24/2022 0650   BILITOT 0.4 05/19/2022 0913   GFRNONAA 32 (L) 07/26/2022 0458   GFRNONAA >60  05/19/2022 0913   GFRAA 56 (L) 10/02/2019 1122   Lipase     Component Value Date/Time   LIPASE 22 06/02/2018 0704       Studies/Results: US RENAL  Result Date: 07/25/2022 CLINICAL DATA:  [id number] AKI (acute kidney injury) (HCC) [id number] EXAM: RENAL / URINARY TRACT ULTRASOUND COMPLETE COMPARISON:  Jul 21, 2022. FINDINGS: Evaluation is limited by body habitus and limited acoustic windows. Right Kidney: Renal measurements: 11.9 x 7.1 x 6.5 cm = volume: 288 mL. Echogenicity within normal limits. No definitive mass or hydronephrosis visualized. Left Kidney: Renal measurements: 11.1 x 6.3 x 5.2 cm = volume: 189 mL. Echogenicity within normal limits. No mass or hydronephrosis visualized. Bladder: Appears normal for degree of bladder distention. Other: None. IMPRESSION: No definitive hydronephrosis. Electronically Signed   By: Meda Klinefelter M.D.   On: 07/25/2022 15:47    Anti-infectives: Anti-infectives (From admission, onward)    Start     Dose/Rate Route Frequency Ordered Stop   07/25/22 1800  ceFAZolin (ANCEF) IVPB 2g/100 mL premix        2 g 200 mL/hr over 30 Minutes Intravenous Every 12 hours 07/25/22 0824 07/31/22 0559   07/24/22 0815  ceFAZolin (ANCEF) IVPB 2g/100 mL premix  Status:  Discontinued        2 g 200 mL/hr over 30 Minutes Intravenous Every  8 hours 07/24/22 0717 07/25/22 0824   07/23/22 2200  vancomycin (VANCOCIN) IVPB 1000 mg/200 mL premix  Status:  Discontinued        1,000 mg 200 mL/hr over 60 Minutes Intravenous Every 48 hours 07/21/22 2329 07/22/22 0812   07/23/22 2200  vancomycin (VANCOREADY) IVPB 1250 mg/250 mL  Status:  Discontinued        1,250 mg 166.7 mL/hr over 90 Minutes Intravenous Every 48 hours 07/22/22 1104 07/24/22 0717   07/22/22 1000  linezolid (ZYVOX) IVPB 600 mg  Status:  Discontinued        600 mg 300 mL/hr over 60 Minutes Intravenous Every 12 hours 07/22/22 0813 07/22/22 0843   07/22/22 0045  cefTRIAXone (ROCEPHIN) 1 g in sodium chloride 0.9 % 100  mL IVPB  Status:  Discontinued        1 g 200 mL/hr over 30 Minutes Intravenous Every 24 hours 07/22/22 0034 07/24/22 0717   07/21/22 2300  vancomycin (VANCOCIN) IVPB 1000 mg/200 mL premix        1,000 mg 200 mL/hr over 60 Minutes Intravenous  Once 07/21/22 2259 07/22/22 0013        Assessment/Plan 72 yo female with abdominal wall soft tissue infection s/p excisional debridement 07/23/22. Plan for wound vac change this morning. If no need for further debridement, plan to transition to home wound vac.     LOS: 4 days    Sophronia Simas, MD [facility] Surgery General, Hepatobiliary and Pancreatic Surgery 07/26/22 8:56 AM

## 2022-07-26 NOTE — Progress Notes (Signed)
PROGRESS NOTE    [name]  ZOX:[id number] DOB: 1950-07-22 DOA: 07/21/2022 PCP: Rodrigo Ran, MD    Brief Narrative:  72 year old with history of diastolic congestive heart failure, type 2 diabetes on insulin, hypertension, hyperlipidemia, pancytopenia and morbid obesity presenting with worsening abdominal wound and surrounding cellulitis.  Recently admitted to [facility] hospital, MSSA infection of the abdominal wall and discharged on Zyvox.  In the emergency room pancytopenia.  CT scan abdomen pelvis with evidence of cellulitis without subcutaneous gas or abscess.  Started on IV antibiotics.  Underwent excision debridement on 5/3.  Remains in the hospital with painful abdominal wall wound and surgical management.   Assessment & Plan:   Abdominal wall abscess and cellulitis: Chronic recurrent abdominal wall abscesses. Hospitalized at [facility] health septic shock 4/17-4/24, MSSA and rare Enterococcus.  Discharged on linezolid. Presented back to the hospital with worsening cellulitis.  CT scan concerning for cellulitis. Surgical debridement and wound VAC placement 5/3 by surgery. Currently on Ancef.  Blood cultures negative.  Wound cultures negative so far. May need further debridement or wound management in the hospital prior to discharge.  Acute kidney injury: Known creatinine baseline of 0.9-1.  Continue holding diuretics.  Vancomycin was discontinued.  Creatinine was gradually worsening however now improving.  Urine output is adequate.  Renal ultrasound is normal without evidence of hydronephrosis. Continue Ringer lactate 75 mL/h today.  Creatinine 1.7 today.  Chronic medical issues including Controlled type 2 diabetes on insulin: Known A1c of 6.8.  Current insulin regimen continued. Chronic diastolic dysfunction: None diastolic dysfunction with ejection fraction 60%.  On oral Lasix at home.  Appears euvolemic.  With AKI.  Holding Lasix. Pancytopenia: Mostly chronic. Folic acid  deficiency: Folic acid level 3.6.  she  was started on replacement.  Will send on discharge. Essential hypertension: On metoprolol.  Stable.     DVT prophylaxis: SCD's Start: 07/23/22 1303 SCDs Start: 07/22/22 0033   Code Status: Full code Family Communication: Husband at the bedside Disposition Plan: Status is: Inpatient Remains inpatient appropriate because: Active wound care     Consultants:  General surgery  Procedures:  Incision and drainage abdominal wound  Antimicrobials:  Ancef   Subjective: Patient seen in the morning rounds.  Husband was at the bedside.  She had denied any complaints.  Apparently, when wound VAC change was done patient had severe pain and discomfort.   Objective: Vitals:   07/25/22 2222 07/26/22 0500 07/26/22 0503 07/26/22 0857  BP:   (!) 152/59   Pulse:   64   Resp:   17   Temp:   98.7 F (37.1 C)   TempSrc:   Oral   SpO2: 97%  98% 93%  Weight:  92.5 kg    Height:        Intake/Output Summary (Last 24 hours) at 07/26/2022 1059 Last data filed at 07/26/2022 0810 Gross per 24 hour  Intake 2712.08 ml  Output 1125 ml  Net 1587.08 ml   Filed Weights   07/21/22 2112 07/25/22 0500 07/26/22 0500  Weight: 93.9 kg 92.9 kg 92.5 kg    Examination:  General exam: Appears calm and comfortable at rest. Respiratory system: No added sounds. Cardiovascular system: S1 & S2 heard, RRR.  Gastrointestinal system: Large and pendulous. Wound VAC on the right side lower abdomen and pannus.  Minimal serosanguineous drainage.   Central nervous system: Alert and oriented. No focal neurological deficits. Extremities: Symmetric 5 x 5 power.     Data Reviewed: I have personally  reviewed following labs and imaging studies  CBC: Recent Labs  Lab 07/21/22 2200 07/21/22 2216 07/22/22 0514 07/23/22 0516 07/24/22 0650 07/25/22 0506 07/26/22 0458  WBC 1.9*  --  1.6* 2.6* 5.8 5.8 3.6*  NEUTROABS 1.3*  --  1.0*  --   --   --   --   HGB 7.2*   < >  7.2* 7.7* 7.4* 7.3* 7.3*  HCT 22.5*   < > 23.6* 24.9* 23.4* 23.2* 22.9*  MCV 85.2  --  85.5 85.0 83.9 84.1 83.3  PLT 115*  --  104* 110* 106* 101* 119*   < > = values in this interval not displayed.   Basic Metabolic Panel: Recent Labs  Lab 07/21/22 2219 07/22/22 0514 07/23/22 0516 07/24/22 0650 07/25/22 0506 07/26/22 0458  NA  --  136 137 131* 133* 133*  K  --  4.7 5.1 5.0 4.9 4.4  CL  --  100 103 100 99 100  CO2  --  28 25 21* 24 25  GLUCOSE  --  209* 135* 184* 152* 152*  BUN  --  59* 46* 48* 48* 45*  CREATININE  --  1.30* 1.45* 1.77* 2.07* 1.70*  CALCIUM  --  8.7* 9.2 8.9 9.0 8.8*  MG 2.3 2.4  --  2.0 2.0 1.9  PHOS  --   --   --   --  3.6 3.7   GFR: Estimated Creatinine Clearance: 31.3 mL/min (A) (by C-G formula based on SCr of 1.7 mg/dL (H)). Liver Function Tests: Recent Labs  Lab 07/21/22 2200 07/22/22 0514 07/24/22 0650 07/25/22 0506  AST 11* 10* 12*  --   ALT 9 8 9   --   ALKPHOS 49 47 54  --   BILITOT 0.5 0.6 0.5  --   PROT 7.1 6.9 7.5  --   ALBUMIN 3.0* 2.9* 3.2* 3.1*   No results for input(s): "LIPASE", "AMYLASE" in the last 168 hours. No results for input(s): "AMMONIA" in the last 168 hours. Coagulation Profile: Recent Labs  Lab 07/21/22 2200  INR 1.2   Cardiac Enzymes: No results for input(s): "CKTOTAL", "CKMB", "CKMBINDEX", "TROPONINI" in the last 168 hours. BNP (last 3 results) No results for input(s): "PROBNP" in the last 8760 hours. HbA1C: No results for input(s): "HGBA1C" in the last 72 hours. CBG: Recent Labs  Lab 07/25/22 0758 07/25/22 1134 07/25/22 1708 07/25/22 2028 07/26/22 0808  GLUCAP 140* 149* 142* 251* 151*   Lipid Profile: No results for input(s): "CHOL", "HDL", "LDLCALC", "TRIG", "CHOLHDL", "LDLDIRECT" in the last 72 hours. Thyroid Function Tests: No results for input(s): "TSH", "T4TOTAL", "FREET4", "T3FREE", "THYROIDAB" in the last 72 hours. Anemia Panel: Recent Labs    07/24/22 0650  VITAMINB12 1,025*  FOLATE 3.6*   FERRITIN 167  TIBC 357  IRON 45  RETICCTPCT 0.5   Sepsis Labs: Recent Labs  Lab 07/21/22 2200  LATICACIDVEN 1.8    Recent Results (from the past 240 hour(s))  Blood Culture (routine x 2)     Status: None   Collection Time: 07/21/22 10:00 PM   Specimen: BLOOD  Result Value Ref Range Status   Specimen Description   Final    BLOOD SITE NOT SPECIFIED Performed at [facility], [address], [place], [place] [address]    Special Requests   Final    BOTTLES DRAWN AEROBIC AND ANAEROBIC Blood Culture results may not be optimal due to an excessive volume of blood received in culture bottles Performed at [facility], [address], [place], [place] [address]    Culture   Final    NO GROWTH 5 DAYS Performed at [facility], [address] [place], [place] [address]    Report Status 07/26/2022 FINAL  Final  Blood Culture (routine x 2)     Status: None   Collection Time: 07/21/22 10:08 PM   Specimen: BLOOD  Result Value Ref Range Status   Specimen Description   Final    BLOOD SITE NOT SPECIFIED Performed at [facility], [address], [place], [place] [address]    Special Requests   Final    BOTTLES DRAWN AEROBIC AND ANAEROBIC Blood Culture results may not be optimal due to an excessive volume of blood received in culture bottles Performed at [facility], [address], [place], [place] [address]    Culture   Final    NO GROWTH 5 DAYS Performed at [facility], [address], [place], [place] [address]    Report Status 07/26/2022 FINAL  Final  Surgical pcr screen     Status: None   Collection Time: 07/22/22  4:29 PM   Specimen: Nasal Mucosa; Nasal Swab  Result Value Ref Range Status   MRSA, PCR NEGATIVE NEGATIVE Final   Staphylococcus aureus NEGATIVE NEGATIVE Final    Comment: (NOTE) The Xpert SA Assay (FDA approved for NASAL specimens in patients 57 years of age and older),  is one component of a comprehensive surveillance program. It is not intended to diagnose infection nor to guide or monitor treatment. Performed at [facility], [address], [place], [place] [address]          Radiology Studies: US RENAL  Result Date: 07/25/2022 CLINICAL DATA:  [id number] AKI (acute kidney injury) [facility]) [id number] EXAM: RENAL / URINARY TRACT ULTRASOUND COMPLETE COMPARISON:  Jul 21, 2022. FINDINGS: Evaluation is limited by body habitus and limited acoustic windows. Right Kidney: Renal measurements: 11.9 x 7.1 x 6.5 cm = volume: 288 mL. Echogenicity within normal limits. No definitive mass or hydronephrosis visualized. Left Kidney: Renal measurements: 11.1 x 6.3 x 5.2 cm = volume: 189 mL. Echogenicity within normal limits. No mass or hydronephrosis visualized. Bladder: Appears normal for degree of bladder distention. Other: None. IMPRESSION: No definitive hydronephrosis. Electronically Signed   By: Meda Klinefelter M.D.   On: 07/25/2022 15:47        Scheduled Meds:  ascorbic acid  500 mg Oral BID   feeding supplement  237 mL Oral Q24H   fenofibrate  54 mg Oral Q supper   FLUoxetine  40 mg Oral QHS   folic acid  1 mg Oral Daily   gabapentin  300 mg Oral TID   insulin aspart  0-9 Units Subcutaneous TID WC   insulin glargine-yfgn  10 Units Subcutaneous BID   lipase/protease/amylase  72,000 Units Oral TID WC   metoprolol tartrate  50 mg Oral TID   mometasone-formoterol  2 puff Inhalation BID   multivitamin with minerals  1 tablet Oral Daily   nutrition supplement (JUVEN)  1 packet Oral BID WC   pantoprazole  40 mg Oral Daily   propylthiouracil  50 mg Oral Daily   Ensure Max Protein  11 oz Oral BID   rOPINIRole  2 mg Oral QHS   zinc sulfate  220 mg Oral Daily   Continuous Infusions:   ceFAZolin (ANCEF) IV 2 g (07/26/22 0538)   lactated ringers 75 mL/hr at  07/25/22 2200     LOS: 4 days    Time spent: 35 minutes    Dorcas Carrow, MD [facility] Pager [phone]

## 2022-07-26 NOTE — Progress Notes (Signed)
   VAC change completed.  Was very painful for patient.  Will re-eval on Wednesday to see if we can go to twice a week or if they patient chooses to resume NS WD instead of VAC.  Letha Cape, PA-C

## 2022-07-26 NOTE — Progress Notes (Signed)
Physical Therapy Treatment Patient Details Name: [name] MRN: [id number] DOB: [birth year] Today's Date: 07/26/2022   History of Present Illness 72 yo female admitted to hospital on 07/21/2022 due to worsening of cellulitis of abdominal wound with failure of OP PO ABX. Pt is currently scheduled for I and D of abdominal wall wound due to presence of necrotic tissue. Pt was found to have abn labs with HgB 7.7, CT of abdomen indicating cellulitis of the anterior inferior abdominal wall and no evidence for soft tissue gas or abscess and CXR negative for acute findings. Pt was recently hospitalized at [facility] 4/17-4/24 due to cellulitis and septic shock. Pt has PMH including but not limited to DM II, dCHF, HDL, fibromyalgia, graves dz, asthma, HTN, HDL, OSA, pancytopenia and necrotizing fasciitis.    PT Comments    Progressing with mobility. Pt tolerated activity fairly well. She reports getting easily fatigued with activity. Pt appeared controlled. Will continue to follow and progress activity during this hospital stay. Pt will benefit from continued rehab after discharge.     Recommendations for follow up therapy are one component of a multi-disciplinary discharge planning process, led by the attending physician.  Recommendations may be updated based on patient status, additional functional criteria and insurance authorization.  Follow Up Recommendations       Assistance Recommended at Discharge Intermittent Supervision/Assistance  Patient can return home with the following A little help with walking and/or transfers;A little help with bathing/dressing/bathroom;Assistance with cooking/housework;Assist for transportation;Help with stairs or ramp for entrance   Equipment Recommendations  None recommended by PT    Recommendations for Other Services       Precautions / Restrictions Precautions Precautions: Fall Precaution Comments: wound vac Restrictions Weight Bearing Restrictions: No      Mobility  Bed Mobility               General bed mobility comments: up in recliner    Transfers Overall transfer level: Needs assistance Equipment used: Rolling walker (2 wheels) Transfers: Sit to/from Stand Sit to Stand: Min guard           General transfer comment: cuse for proper UE placement    Ambulation/Gait Ambulation/Gait assistance: Min guard Gait Distance (Feet): 50 Feet Assistive device: Rolling walker (2 wheels) Gait Pattern/deviations: Step-through pattern, Decreased stride length       General Gait Details: Slow gait speed. Fatigues fairly easily. Mildly unsteady intermittently.   Stairs             Wheelchair Mobility    Modified Rankin (Stroke Patients Only)       Balance Overall balance assessment: Needs assistance         Standing balance support: During functional activity, Reliant on assistive device for balance Standing balance-Leahy Scale: Fair                              Cognition Arousal/Alertness: Awake/alert Behavior During Therapy: WFL for tasks assessed/performed Overall Cognitive Status: Within Functional Limits for tasks assessed                                          Exercises      General Comments        Pertinent Vitals/Pain Pain Assessment Pain Assessment: Faces Faces Pain Scale: Hurts little more Pain Location: abdomen and  knee Pain  Descriptors / Indicators: Aching, Grimacing, Discomfort Pain Intervention(s): Limited activity within patient's tolerance, Monitored during session, Repositioned    Home Living Family/patient expects to be discharged to:: Private residence Living Arrangements: Spouse/significant other Available Help at Discharge: Family Type of Home: House Home Access: Stairs to enter   Secretary/administrator of Steps: 1+1   Home Layout: One level Home Equipment: Pharmacist, hospital (2 wheels);BSC/3in1;Rollator (4 wheels)      Prior  Function            PT Goals (current goals can now be found in the care plan section) Progress towards PT goals: Progressing toward goals    Frequency    Min 1X/week      PT Plan Current plan remains appropriate    Co-evaluation              AM-PAC PT "6 Clicks" Mobility   Outcome Measure  Help needed turning from your back to your side while in a flat bed without using bedrails?: A Little Help needed moving from lying on your back to sitting on the side of a flat bed without using bedrails?: A Little Help needed moving to and from a bed to a chair (including a wheelchair)?: A Little Help needed standing up from a chair using your arms (e.g., wheelchair or bedside chair)?: A Little Help needed to walk in hospital room?: A Little Help needed climbing 3-5 steps with a railing? : A Lot 6 Click Score: 17    End of Session   Activity Tolerance: Patient tolerated treatment well Patient left: in chair;with call bell/phone within reach   PT Visit Diagnosis: Unsteadiness on feet (R26.81);Other abnormalities of gait and mobility (R26.89);Muscle weakness (generalized) (M62.81);Difficulty in walking, not elsewhere classified (R26.2);Pain     Time: 1610-9604 PT Time Calculation (min) (ACUTE ONLY): 8 min  Charges:  $Gait Training: 8-22 mins                         Faye Ramsay, PT Acute Rehabilitation  Office: [phone]

## 2022-07-26 NOTE — Evaluation (Signed)
Occupational Therapy Evaluation Patient Details Name: [name] MRN: [id number] DOB: 05/16/1950 Today's Date: 07/26/2022   History of Present Illness 72 yo female admitted to hospital on 07/21/2022 due to worsening of cellulitis of abdominal wound with failure of OP PO ABX. Pt is currently scheduled for I and D of abdominal wall wound due to presence of necrotic tissue. Pt was found to have abn labs with HgB 7.7, CT of abdomen indicating cellulitis of the anterior inferior abdominal wall and no evidence for soft tissue gas or abscess and CXR negative for acute findings. Pt was recently hospitalized at [facility] 4/17-4/24 due to cellulitis and septic shock. Pt has PMH including but not limited to DM II, dCHF, HDL, fibromyalgia, graves dz, asthma, HTN, HDL, OSA, pancytopenia and necrotizing fasciitis.   Clinical Impression   Patient is a 72 year old female who was admitted for above. Patient was living at home with husband independently. Patient is nearing baseline with use of RW for functional mobility and transfers. Patient was noted to have a difficult time with reaching BLE with current pain and discomfort in belly. Patient would benefit from continued skilled OT services for education on AE/AD to improve independence in LB dressing/bathing tasks.      Recommendations for follow up therapy are one component of a multi-disciplinary discharge planning process, led by the attending physician.  Recommendations may be updated based on patient status, additional functional criteria and insurance authorization.   Assistance Recommended at Discharge Frequent or constant Supervision/Assistance  Patient can return home with the following A little help with bathing/dressing/bathroom;Assistance with cooking/housework;Direct supervision/assist for medications management;Assist for transportation;Help with stairs or ramp for entrance;Direct supervision/assist for financial management    Functional Status  Assessment  Patient has had a recent decline in their functional status and demonstrates the ability to make significant improvements in function in a reasonable and predictable amount of time.  Equipment Recommendations  None recommended by OT       Precautions / Restrictions Precautions Precautions: Fall Precaution Comments: wound vac Restrictions Weight Bearing Restrictions: No      Mobility Bed Mobility               General bed mobility comments: up in recliner              Balance Overall balance assessment: Needs assistance Sitting-balance support: Feet supported Sitting balance-Leahy Scale: Good     Standing balance support: During functional activity, Reliant on assistive device for balance Standing balance-Leahy Scale: Fair         ADL either performed or assessed with clinical judgement   ADL Overall ADL's : Needs assistance/impaired Eating/Feeding: Sitting;Set up   Grooming: Sitting;Set up   Upper Body Bathing: Sitting;Min guard   Lower Body Bathing: Sitting/lateral leans;Moderate assistance Lower Body Bathing Details (indicate cue type and reason): cant cross legs Upper Body Dressing : Set up;Sitting   Lower Body Dressing: Sitting/lateral leans;Moderate assistance Lower Body Dressing Details (indicate cue type and reason): unable to reach LLE Toilet Transfer: Min guard;Ambulation;Rolling walker (2 wheels) Toilet Transfer Details (indicate cue type and reason): with increased time Toileting- Clothing Manipulation and Hygiene: Sitting/lateral lean;Maximal assistance                Pertinent Vitals/Pain Pain Assessment Pain Assessment: 0-10 Pain Score: 6  Pain Location: abdomen and  knee Pain Descriptors / Indicators: Constant, Dull Pain Intervention(s): Limited activity within patient's tolerance, Monitored during session, Premedicated before session     Hand Dominance  Right   Extremity/Trunk Assessment Upper Extremity  Assessment Upper Extremity Assessment: Overall WFL for tasks assessed   Lower Extremity Assessment Lower Extremity Assessment: Defer to PT evaluation   Cervical / Trunk Assessment Cervical / Trunk Assessment: Kyphotic   Communication Communication Communication: No difficulties   Cognition Arousal/Alertness: Awake/alert Behavior During Therapy: WFL for tasks assessed/performed Overall Cognitive Status: Within Functional Limits for tasks assessed       General Comments: plesant and cooperative even with pain                Home Living Family/patient expects to be discharged to:: Private residence Living Arrangements: Spouse/significant other Available Help at Discharge: Family Type of Home: House Home Access: Stairs to enter Secretary/administrator of Steps: 1+1   Home Layout: One level     Bathroom Shower/Tub: Producer, television/film/video: Handicapped height Bathroom Accessibility: Yes   Home Equipment: Pharmacist, hospital (2 wheels);BSC/3in1;Rollator (4 wheels)          Prior Functioning/Environment Prior Level of Function : Needs assist       Physical Assist : ADLs (physical);Mobility (physical) Mobility (physical): Transfers   Mobility Comments: rollator ADLs Comments: husband completed IADLs, meal prep and laundry tasks        OT Problem List: Decreased activity tolerance;Impaired balance (sitting and/or standing);Decreased coordination;Decreased safety awareness;Decreased knowledge of precautions;Decreased knowledge of use of DME or AE;Pain      OT Treatment/Interventions: Self-care/ADL training;Energy conservation;Therapeutic exercise;Therapeutic activities;Patient/family education;DME and/or AE instruction;Balance training    OT Goals(Current goals can be found in the care plan section) Acute Rehab OT Goals Patient Stated Goal: to get pain under control OT Goal Formulation: With patient Time For Goal Achievement: 08/09/22 Potential  to Achieve Goals: Fair  OT Frequency: Min 1X/week       AM-PAC OT "6 Clicks" Daily Activity     Outcome Measure Help from another person eating meals?: None Help from another person taking care of personal grooming?: None Help from another person toileting, which includes using toliet, bedpan, or urinal?: A Little Help from another person bathing (including washing, rinsing, drying)?: A Little Help from another person to put on and taking off regular upper body clothing?: A Little Help from another person to put on and taking off regular lower body clothing?: A Little 6 Click Score: 20   End of Session Nurse Communication: Other (comment) (ok to participate)  Activity Tolerance: Patient tolerated treatment well Patient left: Other (comment) (with PT)  OT Visit Diagnosis: Unsteadiness on feet (R26.81);Other abnormalities of gait and mobility (R26.89);Pain                Time: 1478-2956 OT Time Calculation (min): 14 min Charges:  OT General Charges $OT Visit: 1 Visit OT Evaluation $OT Eval Low Complexity: 1 Low  [name] OTR/L, MS Acute Rehabilitation Department Office# [phone]   [name] 07/26/2022, 4:29 PM

## 2022-07-27 ENCOUNTER — Ambulatory Visit: Payer: Self-pay | Admitting: Cardiology

## 2022-07-27 DIAGNOSIS — L03311 Cellulitis of abdominal wall: Secondary | ICD-10-CM | POA: Diagnosis not present

## 2022-07-27 LAB — TYPE AND SCREEN
Antibody Screen: NEGATIVE
Unit division: 0

## 2022-07-27 LAB — CBC
HCT: 20.4 % — ABNORMAL LOW (ref 36.0–46.0)
Hemoglobin: 6.4 g/dL — CL (ref 12.0–15.0)
MCH: 26.3 pg (ref 26.0–34.0)
MCHC: 31.4 g/dL (ref 30.0–36.0)
MCV: 84 fL (ref 80.0–100.0)
Platelets: 120 10*3/uL — ABNORMAL LOW (ref 150–400)
RBC: 2.43 MIL/uL — ABNORMAL LOW (ref 3.87–5.11)
RDW: 14.6 % (ref 11.5–15.5)
WBC: 3.8 10*3/uL — ABNORMAL LOW (ref 4.0–10.5)
nRBC: 1.1 % — ABNORMAL HIGH (ref 0.0–0.2)

## 2022-07-27 LAB — MAGNESIUM: Magnesium: 1.8 mg/dL (ref 1.7–2.4)

## 2022-07-27 LAB — HEMOGLOBIN AND HEMATOCRIT, BLOOD
HCT: 24.4 % — ABNORMAL LOW (ref 36.0–46.0)
Hemoglobin: 7.8 g/dL — ABNORMAL LOW (ref 12.0–15.0)

## 2022-07-27 LAB — GLUCOSE, CAPILLARY
Glucose-Capillary: 131 mg/dL — ABNORMAL HIGH (ref 70–99)
Glucose-Capillary: 153 mg/dL — ABNORMAL HIGH (ref 70–99)
Glucose-Capillary: 113 mg/dL — ABNORMAL HIGH (ref 70–99)
Glucose-Capillary: 133 mg/dL — ABNORMAL HIGH (ref 70–99)

## 2022-07-27 LAB — BASIC METABOLIC PANEL
Anion gap: 8 (ref 5–15)
BUN: 44 mg/dL — ABNORMAL HIGH (ref 8–23)
CO2: 25 mmol/L (ref 22–32)
Calcium: 8.4 mg/dL — ABNORMAL LOW (ref 8.9–10.3)
Chloride: 98 mmol/L (ref 98–111)
Creatinine, Ser: 1.82 mg/dL — ABNORMAL HIGH (ref 0.44–1.00)
GFR, Estimated: 30 mL/min — ABNORMAL LOW (ref >60)
Glucose, Bld: 146 mg/dL — ABNORMAL HIGH (ref 70–99)
Potassium: 4.5 mmol/L (ref 3.5–5.1)
Sodium: 131 mmol/L — ABNORMAL LOW (ref 135–145)

## 2022-07-27 LAB — PHOSPHORUS: Phosphorus: 3.5 mg/dL (ref 2.5–4.6)

## 2022-07-27 LAB — BPAM RBC: Blood Product Expiration Date: 202405152359

## 2022-07-27 LAB — PREPARE RBC (CROSSMATCH)

## 2022-07-27 LAB — VITAMIN A: Vitamin A (Retinoic Acid): 49.5 ug/dL (ref 22.0–69.5)

## 2022-07-27 MED ORDER — OXYCODONE HCL 5 MG PO TABS: 5.0000 mg | ORAL_TABLET | ORAL | Status: DC | PRN

## 2022-07-27 MED ORDER — SODIUM CHLORIDE 0.9% IV SOLUTION: Freq: Once | INTRAVENOUS | Status: AC

## 2022-07-27 NOTE — Progress Notes (Signed)
  Date and time results received: 07/27/22  0533  Test: Hemoglobin Critical Value: 6.4  Name of Provider Notified: Luiz Iron, FNP  Orders Received? Or Actions Taken?: YES  1 unit PRBC ordered Waiting for crossmatch and type & screen

## 2022-07-27 NOTE — Progress Notes (Signed)
Placed patient on home CPAP.  Patient resting well.

## 2022-07-27 NOTE — TOC Progression Note (Signed)
Transition of Care [facility]) - Progression Note    Patient Details  Name: [name] MRN: [id number] Date of Birth: [birth year]  [facility] CM/SW Contact  Amada Jupiter, LCSW Phone Number: 07/27/2022, 11:38 AM  Clinical Narrative:     Have reviewed with pt that I have secured HHPT and HHRN coverage with [facility] (following pt PTA).  She is aware plan is for dc home with [facility] wound VAC and agreeable.  Have started referral with [facility] with anticipation pt may be medically ready for dc tomorrow, per PA.  Continuing to follow.  Expected Discharge Plan: Home w Home Health Services Barriers to Discharge: Continued Medical Work up  Expected Discharge Plan and Services   Discharge Planning Services: CM Consult   Living arrangements for the past 2 months: Single Family Home                                       Social Determinants of Health (SDOH) Interventions SDOH Screenings   Food Insecurity: No Food Insecurity (07/22/2022)  Housing: Low Risk  (07/22/2022)  Transportation Needs: No Transportation Needs (07/22/2022)  Utilities: Not At Risk (07/22/2022)  Depression (PHQ2-9): Low Risk  (05/27/2022)  Financial Resource Strain: Low Risk  (12/24/2017)  Physical Activity: Inactive (12/24/2017)  Social Connections: Unknown (12/24/2017)  Stress: Stress Concern Present (12/24/2017)  Tobacco Use: Low Risk  (07/24/2022)    Readmission Risk Interventions    03/12/2022   11:50 AM  Readmission Risk Prevention Plan  Transportation Screening Complete  PCP or Specialist Appt within 5-7 Days Complete  Home Care Screening Complete  Medication Review (RN CM) Complete

## 2022-07-27 NOTE — Progress Notes (Signed)
PROGRESS NOTE    [name]  ZOX:[id number] DOB: Jul 21, 1950 DOA: 07/21/2022 PCP: Rodrigo Ran, MD    Brief Narrative:  72 year old with history of diastolic congestive heart failure, type 2 diabetes on insulin, hypertension, hyperlipidemia, pancytopenia and morbid obesity presenting with worsening abdominal wound and surrounding cellulitis.  Recently admitted to [facility], MSSA infection of the abdominal wall and discharged on Zyvox.  In the emergency room pancytopenia.  CT scan abdomen pelvis with evidence of cellulitis without subcutaneous gas or abscess.  Started on IV antibiotics.  Underwent excision debridement on 5/3.  Remains in the hospital with painful abdominal wall wound and surgical management.   Assessment & Plan:   Abdominal wall abscess and cellulitis: Chronic recurrent abdominal wall abscesses. Hospitalized at [facility] septic shock 4/17-4/24, MSSA and rare Enterococcus.  Discharged on linezolid. Presented back to the hospital with worsening cellulitis.  CT scan concerning for cellulitis. Surgical debridement and wound VAC placement 5/3 by surgery. Currently on Ancef.  Blood cultures negative.  Wound cultures negative so far. May need further debridement or wound management in the hospital prior to discharge. Anticipating home with wound VAC as per surgery.  Will continue IV Ancef today.  She might have completed antibiotic therapy, will start on discharge.  Acute kidney injury: Known creatinine baseline of 0.9-1.  Continue holding diuretics.  Vancomycin was discontinued.  Creatinine was gradually worsening however now plateauing.  Urine output is adequate.  Renal ultrasound is normal without evidence of hydronephrosis. Continue Ringer lactate 75 mL/h today.  Creatinine 1.82 today.  Anemia of acute blood loss, chronic iron deficiency anemia with baseline hemoglobin of 8-9. Expected blood loss anemia due to acute illness and surgical debridement.  Hemoglobin 6.4.   1 unit of PRBC today.  Chronic medical issues including Controlled type 2 diabetes on insulin: Known A1c of 6.8.  Current insulin regimen continued. Chronic diastolic dysfunction: Known diastolic dysfunction with ejection fraction 60%.  On oral Lasix at home.  Appears euvolemic.  With AKI.  Holding Lasix. Pancytopenia: Mostly chronic. Folic acid deficiency: Folic acid level 3.6.  she  was started on replacement.  Will send on discharge. Essential hypertension: On metoprolol.  Stable.     DVT prophylaxis: SCD's Start: 07/23/22 1303 SCDs Start: 07/22/22 0033   Code Status: Full code Family Communication: Husband at the bedside Disposition Plan: Status is: Inpatient Remains inpatient appropriate because: Active wound care and IV antibiotics     Consultants:  General surgery  Procedures:  Incision and drainage abdominal wound  Antimicrobials:  Ancef   Subjective:  Patient seen and examined.  Symptoms are fairly controlled.  Pain is controlled today.  She had low-grade fever overnight.  Denies any nausea vomiting cough or shortness of breath.   Objective: Vitals:   07/27/22 0553 07/27/22 0831 07/27/22 0852 07/27/22 0907  BP: (!) 142/60  (!) 130/42 (!) 148/44  Pulse: 70  70 70  Resp: 18  (!) 22 (!) 21  Temp: (!) 97.4 F (36.3 C)  98.2 F (36.8 C) 98.4 F (36.9 C)  TempSrc: Oral  Oral   SpO2: 95% 91% 91%   Weight:      Height:        Intake/Output Summary (Last 24 hours) at 07/27/2022 0953 Last data filed at 07/27/2022 0649 Gross per 24 hour  Intake 2016.27 ml  Output 1050 ml  Net 966.27 ml    Filed Weights   07/21/22 2112 07/25/22 0500 07/26/22 0500  Weight: 93.9 kg 92.9 kg 92.5  kg    Examination:  General exam: Appears comfortable at rest. Respiratory system: No added sounds. Cardiovascular system: S1 & S2 heard, RRR.  Gastrointestinal system: Large and pendulous. Wound VAC on the right side lower abdomen and pannus.  Looks dry and clean.  No  surrounding erythema. Central nervous system: Alert and oriented. No focal neurological deficits. Extremities: Symmetric 5 x 5 power.     Data Reviewed: I have personally reviewed following labs and imaging studies  CBC: Recent Labs  Lab 07/21/22 2200 07/21/22 2216 07/22/22 0514 07/23/22 0516 07/24/22 0650 07/25/22 0506 07/26/22 0458 07/27/22 0447  WBC 1.9*  --  1.6* 2.6* 5.8 5.8 3.6* 3.8*  NEUTROABS 1.3*  --  1.0*  --   --   --   --   --   HGB 7.2*   < > 7.2* 7.7* 7.4* 7.3* 7.3* 6.4*  HCT 22.5*   < > 23.6* 24.9* 23.4* 23.2* 22.9* 20.4*  MCV 85.2  --  85.5 85.0 83.9 84.1 83.3 84.0  PLT 115*  --  104* 110* 106* 101* 119* 120*   < > = values in this interval not displayed.    Basic Metabolic Panel: Recent Labs  Lab 07/22/22 0514 07/23/22 0516 07/24/22 0650 07/25/22 0506 07/26/22 0458 07/27/22 0447  NA 136 137 131* 133* 133* 131*  K 4.7 5.1 5.0 4.9 4.4 4.5  CL 100 103 100 99 100 98  CO2 28 25 21* 24 25 25   GLUCOSE 209* 135* 184* 152* 152* 146*  BUN 59* 46* 48* 48* 45* 44*  CREATININE 1.30* 1.45* 1.77* 2.07* 1.70* 1.82*  CALCIUM 8.7* 9.2 8.9 9.0 8.8* 8.4*  MG 2.4  --  2.0 2.0 1.9 1.8  PHOS  --   --   --  3.6 3.7 3.5    GFR: Estimated Creatinine Clearance: 29.2 mL/min (A) (by C-G formula based on SCr of 1.82 mg/dL (H)). Liver Function Tests: Recent Labs  Lab 07/21/22 2200 07/22/22 0514 07/24/22 0650 07/25/22 0506  AST 11* 10* 12*  --   ALT 9 8 9   --   ALKPHOS 49 47 54  --   BILITOT 0.5 0.6 0.5  --   PROT 7.1 6.9 7.5  --   ALBUMIN 3.0* 2.9* 3.2* 3.1*    No results for input(s): "LIPASE", "AMYLASE" in the last 168 hours. No results for input(s): "AMMONIA" in the last 168 hours. Coagulation Profile: Recent Labs  Lab 07/21/22 2200  INR 1.2    Cardiac Enzymes: No results for input(s): "CKTOTAL", "CKMB", "CKMBINDEX", "TROPONINI" in the last 168 hours. BNP (last 3 results) No results for input(s): "PROBNP" in the last 8760 hours. HbA1C: No results  for input(s): "HGBA1C" in the last 72 hours. CBG: Recent Labs  Lab 07/26/22 0808 07/26/22 1202 07/26/22 1726 07/26/22 2047 07/27/22 0720  GLUCAP 151* 170* 168* 186* 131*    Lipid Profile: No results for input(s): "CHOL", "HDL", "LDLCALC", "TRIG", "CHOLHDL", "LDLDIRECT" in the last 72 hours. Thyroid Function Tests: No results for input(s): "TSH", "T4TOTAL", "FREET4", "T3FREE", "THYROIDAB" in the last 72 hours. Anemia Panel: No results for input(s): "VITAMINB12", "FOLATE", "FERRITIN", "TIBC", "IRON", "RETICCTPCT" in the last 72 hours.  Sepsis Labs: Recent Labs  Lab 07/21/22 2200  LATICACIDVEN 1.8     Recent Results (from the past 240 hour(s))  Blood Culture (routine x 2)     Status: None   Collection Time: 07/21/22 10:00 PM   Specimen: BLOOD  Result Value Ref Range Status   Specimen Description  Final    BLOOD SITE NOT SPECIFIED Performed at [facility], [address] [place], [place] [address]    Special Requests   Final    BOTTLES DRAWN AEROBIC AND ANAEROBIC Blood Culture results may not be optimal due to an excessive volume of blood received in culture bottles Performed at [facility], [address], [place], [place] [address]    Culture   Final    NO GROWTH 5 DAYS Performed at [facility], [address], [place], [place] [address]    Report Status 07/26/2022 FINAL  Final  Blood Culture (routine x 2)     Status: None   Collection Time: 07/21/22 10:08 PM   Specimen: BLOOD  Result Value Ref Range Status   Specimen Description   Final    BLOOD SITE NOT SPECIFIED Performed at [facility], [address] [place], [place] [address]    Special Requests   Final    BOTTLES DRAWN AEROBIC AND ANAEROBIC Blood Culture results may not be optimal due to an excessive volume of blood received in culture bottles Performed at [facility], [address], [place], [place] [address]     Culture   Final    NO GROWTH 5 DAYS Performed at [facility], [address], [place], [place] [address]    Report Status 07/26/2022 FINAL  Final  Surgical pcr screen     Status: None   Collection Time: 07/22/22  4:29 PM   Specimen: Nasal Mucosa; Nasal Swab  Result Value Ref Range Status   MRSA, PCR NEGATIVE NEGATIVE Final   Staphylococcus aureus NEGATIVE NEGATIVE Final    Comment: (NOTE) The Xpert SA Assay (FDA approved for NASAL specimens in patients 9 years of age and older), is one component of a comprehensive surveillance program. It is not intended to diagnose infection nor to guide or monitor treatment. Performed at [facility], [address], [place], [place] [address]          Radiology Studies: US RENAL  Result Date: 07/25/2022 CLINICAL DATA:  [id number] AKI (acute kidney injury) [facility]) [id number] EXAM: RENAL / URINARY TRACT ULTRASOUND COMPLETE COMPARISON:  Jul 21, 2022. FINDINGS: Evaluation is limited by body habitus and limited acoustic windows. Right Kidney: Renal measurements: 11.9 x 7.1 x 6.5 cm = volume: 288 mL. Echogenicity within normal limits. No definitive mass or hydronephrosis visualized. Left Kidney: Renal measurements: 11.1 x 6.3 x 5.2 cm = volume: 189 mL. Echogenicity within normal limits. No mass or hydronephrosis visualized. Bladder: Appears normal for degree of bladder distention. Other: None. IMPRESSION: No definitive hydronephrosis. Electronically Signed   By: Meda Klinefelter M.D.   On: 07/25/2022 15:47        Scheduled Meds:  ascorbic acid  500 mg Oral BID   feeding supplement  237 mL Oral Q24H   fenofibrate  54 mg Oral Q supper   FLUoxetine  40 mg Oral QHS   folic acid  1 mg Oral Daily   gabapentin  300 mg Oral TID   insulin aspart  0-9 Units Subcutaneous TID WC   insulin glargine-yfgn  10 Units Subcutaneous BID   lipase/protease/amylase  72,000 Units Oral TID WC   metoprolol tartrate  50 mg Oral TID    mometasone-formoterol  2 puff Inhalation BID   multivitamin with minerals  1 tablet Oral Daily   nutrition supplement (JUVEN)  1 packet Oral BID WC   pantoprazole  40 mg Oral Daily   propylthiouracil  50 mg Oral Daily   Ensure Max Protein  11 oz Oral BID   rOPINIRole  2 mg Oral QHS   zinc sulfate  220 mg Oral Daily   Continuous Infusions:   ceFAZolin (ANCEF) IV 2 g (07/27/22 0557)   lactated ringers 75 mL/hr at 07/27/22 0339     LOS: 5 days    Time spent: 35 minutes    Dorcas Carrow, MD [facility] Pager [phone]

## 2022-07-27 NOTE — Progress Notes (Signed)
4 Days Post-Op  Subjective: Patient doing ok today.  Had a lot of pain with her VAC change yesterday.  Will start oral meds, but has to have zofran with this.     Objective: Vital signs in last 24 hours: Temp:  [97.4 F (36.3 C)-100.8 F (38.2 C)] 97.4 F (36.3 C) (05/07 0553) Pulse Rate:  [65-73] 70 (05/07 0553) Resp:  [16-18] 18 (05/07 0553) BP: (129-160)/(45-60) 142/60 (05/07 0553) SpO2:  [91 %-96 %] 91 % (05/07 0831) FiO2 (%):  [21 %] 21 % (05/06 2323) Last BM Date : 07/24/22  Intake/Output from previous day: 05/06 0701 - 05/07 0700 In: 2256.3 [P.O.:600; I.V.:1527.8; IV Piggyback:128.5] Out: 1050 [Urine:1000; Drains:50] Intake/Output this shift: No intake/output data recorded.  PE: Abd: VAC in place with good seal.  Lab Results:  Recent Labs    07/26/22 0458 07/27/22 0447  WBC 3.6* 3.8*  HGB 7.3* 6.4*  HCT 22.9* 20.4*  PLT 119* 120*   BMET Recent Labs    07/26/22 0458 07/27/22 0447  NA 133* 131*  K 4.4 4.5  CL 100 98  CO2 25 25  GLUCOSE 152* 146*  BUN 45* 44*  CREATININE 1.70* 1.82*  CALCIUM 8.8* 8.4*   PT/INR No results for input(s): "LABPROT", "INR" in the last 72 hours. CMP     Component Value Date/Time   NA 131 (L) 07/27/2022 0447   K 4.5 07/27/2022 0447   CL 98 07/27/2022 0447   CO2 25 07/27/2022 0447   GLUCOSE 146 (H) 07/27/2022 0447   BUN 44 (H) 07/27/2022 0447   CREATININE 1.82 (H) 07/27/2022 0447   CREATININE 0.97 05/19/2022 0913   CREATININE 0.85 10/11/2018 1609   CALCIUM 8.4 (L) 07/27/2022 0447   PROT 7.5 07/24/2022 0650   ALBUMIN 3.1 (L) 07/25/2022 0506   AST 12 (L) 07/24/2022 0650   AST 14 (L) 05/19/2022 0913   ALT 9 07/24/2022 0650   ALT 10 05/19/2022 0913   ALKPHOS 54 07/24/2022 0650   BILITOT 0.5 07/24/2022 0650   BILITOT 0.4 05/19/2022 0913   GFRNONAA 30 (L) 07/27/2022 0447   GFRNONAA >60 05/19/2022 0913   GFRAA 56 (L) 10/02/2019 1122   Lipase     Component Value Date/Time   LIPASE 22 06/02/2018 0704        Studies/Results: US RENAL  Result Date: 07/25/2022 CLINICAL DATA:  [id number] AKI (acute kidney injury) (HCC) [id number] EXAM: RENAL / URINARY TRACT ULTRASOUND COMPLETE COMPARISON:  Jul 21, 2022. FINDINGS: Evaluation is limited by body habitus and limited acoustic windows. Right Kidney: Renal measurements: 11.9 x 7.1 x 6.5 cm = volume: 288 mL. Echogenicity within normal limits. No definitive mass or hydronephrosis visualized. Left Kidney: Renal measurements: 11.1 x 6.3 x 5.2 cm = volume: 189 mL. Echogenicity within normal limits. No mass or hydronephrosis visualized. Bladder: Appears normal for degree of bladder distention. Other: None. IMPRESSION: No definitive hydronephrosis. Electronically Signed   By: Meda Klinefelter M.D.   On: 07/25/2022 15:47    Anti-infectives: Anti-infectives (From admission, onward)    Start     Dose/Rate Route Frequency Ordered Stop   07/25/22 1800  ceFAZolin (ANCEF) IVPB 2g/100 mL premix        2 g 200 mL/hr over 30 Minutes Intravenous Every 12 hours 07/25/22 0824 07/31/22 0559   07/24/22 0815  ceFAZolin (ANCEF) IVPB 2g/100 mL premix  Status:  Discontinued        2 g 200 mL/hr over 30 Minutes Intravenous Every 8 hours 07/24/22  1610 07/25/22 0824   07/23/22 2200  vancomycin (VANCOCIN) IVPB 1000 mg/200 mL premix  Status:  Discontinued        1,000 mg 200 mL/hr over 60 Minutes Intravenous Every 48 hours 07/21/22 2329 07/22/22 0812   07/23/22 2200  vancomycin (VANCOREADY) IVPB 1250 mg/250 mL  Status:  Discontinued        1,250 mg 166.7 mL/hr over 90 Minutes Intravenous Every 48 hours 07/22/22 1104 07/24/22 0717   07/22/22 1000  linezolid (ZYVOX) IVPB 600 mg  Status:  Discontinued        600 mg 300 mL/hr over 60 Minutes Intravenous Every 12 hours 07/22/22 0813 07/22/22 0843   07/22/22 0045  cefTRIAXone (ROCEPHIN) 1 g in sodium chloride 0.9 % 100 mL IVPB  Status:  Discontinued        1 g 200 mL/hr over 30 Minutes Intravenous Every 24 hours 07/22/22 0034 07/24/22  0717   07/21/22 2300  vancomycin (VANCOCIN) IVPB 1000 mg/200 mL premix        1,000 mg 200 mL/hr over 60 Minutes Intravenous  Once 07/21/22 2259 07/22/22 0013        Assessment/Plan POD 4, s/p excisional debridement of abdominal wall wound, Dr. Gerrit Friends 07/23/22 -cont VAC -start oral pain meds with dressing change tomorrow -working on [facility] for VAC today -if tolerates well, can likely DC home tomorrow after VAC change -may try to shower and see if her seal remains -long discussion with patient and husband today and answered multiple questions regarding the wound, wound VAC, and expected care.  -on ancef, likely doesn't need any more of this as there is no infection currently  FEN - carb mod VTE - on hold due to anemia ID - ancef, wound is clean  I reviewed hospitalist notes, last 24 h vitals and pain scores, last 48 h intake and output, last 24 h labs and trends, and last 24 h imaging results.   LOS: 5 days    Letha Cape , [facility] 07/27/2022, 8:36 AM Please see Amion for pager number during day hours 7:00am-4:30pm or 7:00am -11:30am on weekends

## 2022-07-28 DIAGNOSIS — L03311 Cellulitis of abdominal wall: Secondary | ICD-10-CM | POA: Diagnosis not present

## 2022-07-28 LAB — CBC
HCT: 26.1 % — ABNORMAL LOW (ref 36.0–46.0)
Hemoglobin: 8.2 g/dL — ABNORMAL LOW (ref 12.0–15.0)
MCH: 26.4 pg (ref 26.0–34.0)
MCHC: 31.4 g/dL (ref 30.0–36.0)
MCV: 83.9 fL (ref 80.0–100.0)
Platelets: 152 10*3/uL (ref 150–400)
RBC: 3.11 MIL/uL — ABNORMAL LOW (ref 3.87–5.11)
RDW: 15.2 % (ref 11.5–15.5)
WBC: 3.3 10*3/uL — ABNORMAL LOW (ref 4.0–10.5)
nRBC: 0.6 % — ABNORMAL HIGH (ref 0.0–0.2)

## 2022-07-28 LAB — TYPE AND SCREEN: ABO/RH(D): A NEG

## 2022-07-28 LAB — BASIC METABOLIC PANEL
Anion gap: 5 (ref 5–15)
BUN: 42 mg/dL — ABNORMAL HIGH (ref 8–23)
CO2: 26 mmol/L (ref 22–32)
Calcium: 8.8 mg/dL — ABNORMAL LOW (ref 8.9–10.3)
Chloride: 105 mmol/L (ref 98–111)
Creatinine, Ser: 1.65 mg/dL — ABNORMAL HIGH (ref 0.44–1.00)
GFR, Estimated: 33 mL/min — ABNORMAL LOW (ref >60)
Glucose, Bld: 132 mg/dL — ABNORMAL HIGH (ref 70–99)
Potassium: 4.7 mmol/L (ref 3.5–5.1)
Sodium: 136 mmol/L (ref 135–145)

## 2022-07-28 LAB — BPAM RBC
ISSUE DATE / TIME: 202405070847
Unit Type and Rh: 600

## 2022-07-28 LAB — GLUCOSE, CAPILLARY
Glucose-Capillary: 124 mg/dL — ABNORMAL HIGH (ref 70–99)
Glucose-Capillary: 120 mg/dL — ABNORMAL HIGH (ref 70–99)
Glucose-Capillary: 157 mg/dL — ABNORMAL HIGH (ref 70–99)
Glucose-Capillary: 196 mg/dL — ABNORMAL HIGH (ref 70–99)

## 2022-07-28 LAB — PHOSPHORUS: Phosphorus: 3.6 mg/dL (ref 2.5–4.6)

## 2022-07-28 LAB — MAGNESIUM: Magnesium: 2.2 mg/dL (ref 1.7–2.4)

## 2022-07-28 MED ORDER — OXYCODONE HCL 5 MG PO TABS
10.0000 mg | ORAL_TABLET | ORAL | Status: DC | PRN
  Administered 2022-07-28 – 2022-07-30 (×6): 10 mg via ORAL
  Filled 2022-07-28 (×7): qty 2

## 2022-07-28 NOTE — Progress Notes (Signed)
PT Cancellation Note  Patient Details Name: [name] MRN: [id number] DOB: [birth year]   Cancelled Treatment:     Pt received Wound Vac change this am. Attempted PT after receiving bath from nursing, pt refused due to c/o nausea. Pt received meds, therapist returned again, pt feeling better, however continued to decline any activity. Will re-attempt next available date/time per POC.   Jannet Askew 07/28/2022, 1:16 PM

## 2022-07-28 NOTE — Progress Notes (Signed)
Nutrition Follow-up  DOCUMENTATION CODES:   Obesity unspecified  INTERVENTION:  D/c Ensure Plus High Protein po daily, each supplement provides 350 kcal and 20 grams of protein.  Continue Ensure Max po BID, each supplement provides 150 kcal and 30 grams of protein.   Continue -1 packet Juven BID, each packet provides 95 calories, 2.5 grams of protein (collagen), and 9.8 grams of carbohydrate (3 grams sugar); also contains 7 grams of L-arginine and L-glutamine, 300 mg vitamin C, 15 mg vitamin E, 1.2 mcg vitamin B-12, 9.5 mg zinc, 200 mg calcium, and 1.5 g  Calcium Beta-hydroxy-Beta-methylbutyrate to support wound healing  Encourage PO intake   Liberalize diet to Carb modified to improve po intake   Education on pancreatic insufficiency   NUTRITION DIAGNOSIS:   Increased nutrient needs related to wound healing as evidenced by estimated needs.   GOAL:   Patient will meet greater than or equal to 90% of their needs -ongoing   MONITOR:   PO intake, Supplement acceptance, Labs, Weight trends, I & O's, Skin  REASON FOR ASSESSMENT:   Consult Wound healing  ASSESSMENT:   72 y.o. female with medical history significant for type 2 diabetes mellitus, hyperlipidemia, essential hypertension, hypothyroidism, chronic diastolic heart failure, chronic pancytopenia, who is admitted to [facility] on 07/21/2022 with abdominal wall wound with surrounding cellulitis.   Labs: Glu 132, BUN 42, Cr 1.65 Meds: vitamin C, folvite, ,insulin, creon, MVI, juven BID, ensure max BID, protonix, zinc sulfate Wt: admit wt 207; CBW 207 -RD suspects some masked weight loss 2/2 decreased po intake  I/O's: +3.9 L   Visited patient at bedside who reports she has only eaten bites of her meals today and that she has not been consuming 100% of her meals, contrary to meal documentation in  patient's  chart. Patient reports she had a few bites to eat at breakfast and lunch. She also states it can be hard  and to drink all of her ordered ONS.   RD d/c'd Ensure Plush High Protein and secure messaged Dr. Jerral Ralph before liberalizing diet. She is excited to have more food choices. Patient reports she thinks she will eat better when she goes home which is likely this Friday.   RD and patient reviewed plans for nutrition at home and she plans on continuing Juven, Ensure Max, and a multivitamin.   Patient reports nausea after taking many of her meds on an empty stomach this morning. She denies vomiting, diarrhea, and constipation.     Diet Order:   Diet Order             Diet Carb Modified Fluid consistency: Thin; Room service appropriate? Yes  Diet effective now                   EDUCATION NEEDS:   Education needs have been addressed  Skin:  Skin Assessment: Skin Integrity Issues: Skin Integrity Issues:: Incisions Incisions: 5/3 abdomen  Last BM:  5/8  Height:   Ht Readings from Last 1 Encounters:  07/21/22 5' (1.524 m)    Weight:   Wt Readings from Last 1 Encounters:  07/28/22 94 kg     BMI:  Body mass index is 40.47 kg/m.  Estimated Nutritional Needs:   Kcal:  1700-1900  Protein:  90-100g  Fluid:  1.9L/day    Leodis Rains, RDN, LDN  Clinical Nutrition

## 2022-07-28 NOTE — Progress Notes (Signed)
5 Days Post-Op  Subjective: Seen with WOC RN at time of vac change. Had IV pain meds prior and still with significant discomfort during change. She would prefer to dc with wound vac if possible.  Objective: Vital signs in last 24 hours: Temp:  [97.6 F (36.4 C)-99.6 F (37.6 C)] 97.6 F (36.4 C) (05/08 0440) Pulse Rate:  [56-72] 56 (05/08 0440) Resp:  [17-22] 17 (05/08 0440) BP: (130-162)/(42-51) 141/47 (05/08 0440) SpO2:  [91 %-97 %] 92 % (05/08 0748) Weight:  [94 kg] 94 kg (05/08 0440) Last BM Date : 07/28/22  Intake/Output from previous day: 05/07 0701 - 05/08 0700 In: 4164.1 [P.O.:1917; I.V.:1711.1; Blood:236; IV Piggyback:300] Out: 2751 [Urine:2750; Stool:1] Intake/Output this shift: No intake/output data recorded.  PE: Abd: wound with overall good granulation tissue. A few areas of fibrinous tissue. No tunneling of wound. No surrounding erythema or induration. Healthy bleeding. See below    Lab Results:  Recent Labs    07/27/22 0447 07/27/22 1245 07/28/22 0456  WBC 3.8*  --  3.3*  HGB 6.4* 7.8* 8.2*  HCT 20.4* 24.4* 26.1*  PLT 120*  --  152    BMET Recent Labs    07/27/22 0447 07/28/22 0456  NA 131* 136  K 4.5 4.7  CL 98 105  CO2 25 26  GLUCOSE 146* 132*  BUN 44* 42*  CREATININE 1.82* 1.65*  CALCIUM 8.4* 8.8*    PT/INR No results for input(s): "LABPROT", "INR" in the last 72 hours. CMP     Component Value Date/Time   NA 136 07/28/2022 0456   K 4.7 07/28/2022 0456   CL 105 07/28/2022 0456   CO2 26 07/28/2022 0456   GLUCOSE 132 (H) 07/28/2022 0456   BUN 42 (H) 07/28/2022 0456   CREATININE 1.65 (H) 07/28/2022 0456   CREATININE 0.97 05/19/2022 0913   CREATININE 0.85 10/11/2018 1609   CALCIUM 8.8 (L) 07/28/2022 0456   PROT 7.5 07/24/2022 0650   ALBUMIN 3.1 (L) 07/25/2022 0506   AST 12 (L) 07/24/2022 0650   AST 14 (L) 05/19/2022 0913   ALT 9 07/24/2022 0650   ALT 10 05/19/2022 0913   ALKPHOS 54 07/24/2022 0650   BILITOT 0.5  07/24/2022 0650   BILITOT 0.4 05/19/2022 0913   GFRNONAA 33 (L) 07/28/2022 0456   GFRNONAA >60 05/19/2022 0913   GFRAA 56 (L) 10/02/2019 1122   Lipase     Component Value Date/Time   LIPASE 22 06/02/2018 0704       Studies/Results: No results found.  Anti-infectives: Anti-infectives (From admission, onward)    Start     Dose/Rate Route Frequency Ordered Stop   07/25/22 1800  ceFAZolin (ANCEF) IVPB 2g/100 mL premix        2 g 200 mL/hr over 30 Minutes Intravenous Every 12 hours 07/25/22 0824 07/31/22 0559   07/24/22 0815  ceFAZolin (ANCEF) IVPB 2g/100 mL premix  Status:  Discontinued        2 g 200 mL/hr over 30 Minutes Intravenous Every 8 hours 07/24/22 0717 07/25/22 0824   07/23/22 2200  vancomycin (VANCOCIN) IVPB 1000 mg/200 mL premix  Status:  Discontinued        1,000 mg 200 mL/hr over 60 Minutes Intravenous Every 48 hours 07/21/22 2329 07/22/22 0812   07/23/22 2200  vancomycin (VANCOREADY) IVPB 1250 mg/250 mL  Status:  Discontinued        1,250 mg 166.7 mL/hr over 90 Minutes Intravenous Every 48 hours 07/22/22 1104 07/24/22 0717  07/22/22 1000  linezolid (ZYVOX) IVPB 600 mg  Status:  Discontinued        600 mg 300 mL/hr over 60 Minutes Intravenous Every 12 hours 07/22/22 0813 07/22/22 0843   07/22/22 0045  cefTRIAXone (ROCEPHIN) 1 g in sodium chloride 0.9 % 100 mL IVPB  Status:  Discontinued        1 g 200 mL/hr over 30 Minutes Intravenous Every 24 hours 07/22/22 0034 07/24/22 0717   07/21/22 2300  vancomycin (VANCOCIN) IVPB 1000 mg/200 mL premix        1,000 mg 200 mL/hr over 60 Minutes Intravenous  Once 07/21/22 2259 07/22/22 0013        Assessment/Plan POD 5, s/p excisional debridement of abdominal wall wound, Dr. Gerrit Friends 07/23/22 -cont VAC -working on [facility] for [facility] - still having pain with vac change and did not try oral pain meds today. Wants to go home with vac which I think is a good plan. Shower with vac tomorrow, vac change with PO pain meds Friday and  then hopefully home. Wound healing well and can go on twice weely vac changes. -on ancef, likely doesn't need any more of this as there is no infection currently  FEN - carb mod VTE - on hold due to anemia ID - ancef, wound is clean  I reviewed hospitalist notes, last 24 h vitals and pain scores, last 48 h intake and output, last 24 h labs and trends, and last 24 h imaging results.   LOS: 6 days    Eric Form , [facility] 07/28/2022, 8:30 AM Please see Amion for pager number during day hours 7:00am-4:30pm or 7:00am -11:30am on weekends

## 2022-07-28 NOTE — Progress Notes (Signed)
PROGRESS NOTE    [name]  ZOX:[id number] DOB: [birth year] DOA: 07/21/2022 PCP: Rodrigo Ran, MD    Brief Narrative:  72 year old with history of diastolic congestive heart failure, type 2 diabetes on insulin, hypertension, hyperlipidemia, pancytopenia and morbid obesity presenting with worsening abdominal wound and surrounding cellulitis.  Recently admitted to [facility] hospital, MSSA infection of the abdominal wall and discharged on Zyvox.  In the emergency room pancytopenia.  CT scan abdomen pelvis with evidence of cellulitis without subcutaneous gas or abscess.  Started on IV antibiotics.  Underwent excision debridement on 5/3.  Remains in the hospital with painful abdominal wall wound and surgical management. Remains in the hospital for wound management.   Assessment & Plan:   Abdominal wall abscess and cellulitis: Chronic recurrent abdominal wall abscesses. Hospitalized at [facility] septic shock 4/17-4/24, MSSA and rare Enterococcus.  Discharged on linezolid. Presented back to the hospital with worsening cellulitis.  CT scan concerning for cellulitis. Surgical debridement and wound VAC placement 5/3 by surgery. Currently on Ancef.  Blood cultures negative.  Wound cultures negative so far. Will discontinue further antibiotics especially with improving appearance of the wound. Followed by surgery.  Wound VAC changed today.  Very painful. Patient to tolerate wound care before discharging home.  Acute kidney injury: Known creatinine baseline of 0.9-1.  Continue holding diuretics.  Vancomycin was discontinued.  Creatinine was gradually worsening however now plateauing.  Urine output is adequate.  Renal ultrasound is normal without evidence of hydronephrosis. Creatinine is stabilizing now.  Will discontinue further maintenance IV fluids.  Anemia of acute blood loss, chronic iron deficiency anemia with baseline hemoglobin of 8-9. Expected blood loss anemia due to acute illness and  surgical debridement.  Hemoglobin 6.4. Given 1 unit of PRBC.  Appropriately responded.  Will recheck tomorrow.  Chronic medical issues including Controlled type 2 diabetes on insulin: Known A1c of 6.8.  Current insulin regimen continued. Chronic diastolic dysfunction: Known diastolic dysfunction with ejection fraction 60%.  On oral Lasix at home.  Appears euvolemic.  With AKI.  Holding Lasix.  Discontinue further IV fluids. Pancytopenia: Mostly chronic. Folic acid deficiency: Folic acid level 3.6.  she  was started on replacement.  Will prescribe on discharge. Essential hypertension: On metoprolol.  Stable.  Continue mobility.  Wound VAC change planned for 5/10 and possible discharge after that.     DVT prophylaxis: SCD's Start: 07/23/22 1303 SCDs Start: 07/22/22 0033   Code Status: Full code Family Communication: Husband at the bedside Disposition Plan: Status is: Inpatient Remains inpatient appropriate because: Active wound care and IV antibiotics     Consultants:  General surgery  Procedures:  Incision and drainage abdominal wound  Antimicrobials:  Ancef-completed 7 days of therapy.   Subjective:  Patient seen and examined.  She was very tearful as she had to go undergone wound VAC change today.  It was very painful.  Apparently her wound looks clean with granulating tissue.   Objective: Vitals:   07/27/22 1922 07/27/22 2039 07/28/22 0440 07/28/22 0748  BP:  (!) 162/51 (!) 141/47   Pulse:  69 (!) 56   Resp:  17 17   Temp:  99.6 F (37.6 C) 97.6 F (36.4 C)   TempSrc:  Oral Oral   SpO2: 92% 95% 97% 92%  Weight:   94 kg   Height:        Intake/Output Summary (Last 24 hours) at 07/28/2022 1252 Last data filed at 07/28/2022 1118 Gross per 24 hour  Intake 3427.15 ml  Output 3101 ml  Net 326.15 ml   Filed Weights   07/25/22 0500 07/26/22 0500 07/28/22 0440  Weight: 92.9 kg 92.5 kg 94 kg    Examination:  General exam: Appears anxious.  Appropriately  distressed due to pain. Respiratory system: No added sounds. Cardiovascular system: S1 & S2 heard, RRR.  Gastrointestinal system: Large and pendulous. Wound VAC on the right side lower abdomen and pannus.  Looks dry and clean.  No surrounding erythema. Central nervous system: Alert and oriented. No focal neurological deficits. Extremities: Symmetric 5 x 5 power.  Wound examined today by surgery, picture available in the chart.     Data Reviewed: I have personally reviewed following labs and imaging studies  CBC: Recent Labs  Lab 07/21/22 2200 07/21/22 2216 07/22/22 0514 07/23/22 0516 07/24/22 0650 07/25/22 0506 07/26/22 0458 07/27/22 0447 07/27/22 1245 07/28/22 0456  WBC 1.9*  --  1.6*   < > 5.8 5.8 3.6* 3.8*  --  3.3*  NEUTROABS 1.3*  --  1.0*  --   --   --   --   --   --   --   HGB 7.2*   < > 7.2*   < > 7.4* 7.3* 7.3* 6.4* 7.8* 8.2*  HCT 22.5*   < > 23.6*   < > 23.4* 23.2* 22.9* 20.4* 24.4* 26.1*  MCV 85.2  --  85.5   < > 83.9 84.1 83.3 84.0  --  83.9  PLT 115*  --  104*   < > 106* 101* 119* 120*  --  152   < > = values in this interval not displayed.   Basic Metabolic Panel: Recent Labs  Lab 07/24/22 0650 07/25/22 0506 07/26/22 0458 07/27/22 0447 07/28/22 0456  NA 131* 133* 133* 131* 136  K 5.0 4.9 4.4 4.5 4.7  CL 100 99 100 98 105  CO2 21* 24 25 25 26   GLUCOSE 184* 152* 152* 146* 132*  BUN 48* 48* 45* 44* 42*  CREATININE 1.77* 2.07* 1.70* 1.82* 1.65*  CALCIUM 8.9 9.0 8.8* 8.4* 8.8*  MG 2.0 2.0 1.9 1.8 2.2  PHOS  --  3.6 3.7 3.5 3.6   GFR: Estimated Creatinine Clearance: 32.5 mL/min (A) (by C-G formula based on SCr of 1.65 mg/dL (H)). Liver Function Tests: Recent Labs  Lab 07/21/22 2200 07/22/22 0514 07/24/22 0650 07/25/22 0506  AST 11* 10* 12*  --   ALT 9 8 9   --   ALKPHOS 49 47 54  --   BILITOT 0.5 0.6 0.5  --   PROT 7.1 6.9 7.5  --   ALBUMIN 3.0* 2.9* 3.2* 3.1*   No results for input(s): "LIPASE", "AMYLASE" in the last 168 hours. No  results for input(s): "AMMONIA" in the last 168 hours. Coagulation Profile: Recent Labs  Lab 07/21/22 2200  INR 1.2   Cardiac Enzymes: No results for input(s): "CKTOTAL", "CKMB", "CKMBINDEX", "TROPONINI" in the last 168 hours. BNP (last 3 results) No results for input(s): "PROBNP" in the last 8760 hours. HbA1C: No results for input(s): "HGBA1C" in the last 72 hours. CBG: Recent Labs  Lab 07/27/22 1131 07/27/22 1621 07/27/22 2040 07/28/22 0720 07/28/22 1134  GLUCAP 153* 113* 133* 124* 120*   Lipid Profile: No results for input(s): "CHOL", "HDL", "LDLCALC", "TRIG", "CHOLHDL", "LDLDIRECT" in the last 72 hours. Thyroid Function Tests: No results for input(s): "TSH", "T4TOTAL", "FREET4", "T3FREE", "THYROIDAB" in the last 72 hours. Anemia Panel: No results for input(s): "VITAMINB12", "FOLATE", "FERRITIN", "TIBC", "IRON", "RETICCTPCT" in the  last 72 hours.  Sepsis Labs: Recent Labs  Lab 07/21/22 2200  LATICACIDVEN 1.8    Recent Results (from the past 240 hour(s))  Blood Culture (routine x 2)     Status: None   Collection Time: 07/21/22 10:00 PM   Specimen: BLOOD  Result Value Ref Range Status   Specimen Description   Final    BLOOD SITE NOT SPECIFIED Performed at [facility], [address], [place], [place] [address]    Special Requests   Final    BOTTLES DRAWN AEROBIC AND ANAEROBIC Blood Culture results may not be optimal due to an excessive volume of blood received in culture bottles Performed at [facility], [address], [place], [place] [address]    Culture   Final    NO GROWTH 5 DAYS Performed at [facility], [address] [place], [place] [address]    Report Status 07/26/2022 FINAL  Final  Blood Culture (routine x 2)     Status: None   Collection Time: 07/21/22 10:08 PM   Specimen: BLOOD  Result Value Ref Range Status   Specimen Description   Final    BLOOD SITE NOT SPECIFIED Performed at [facility], [address] [place], [place] [address]    Special Requests   Final    BOTTLES DRAWN AEROBIC AND ANAEROBIC Blood Culture results may not be optimal due to an excessive volume of blood received in culture bottles Performed at [facility], [address] [place], [place] [address]    Culture   Final    NO GROWTH 5 DAYS Performed at [facility], [address] [place], [place] [address]    Report Status 07/26/2022 FINAL  Final  Surgical pcr screen     Status: None   Collection Time: 07/22/22  4:29 PM   Specimen: Nasal Mucosa; Nasal Swab  Result Value Ref Range Status   MRSA, PCR NEGATIVE NEGATIVE Final   Staphylococcus aureus NEGATIVE NEGATIVE Final    Comment: (NOTE) The Xpert SA Assay (FDA approved for NASAL specimens in patients 103 years of age and older), is one component of a comprehensive surveillance program. It is not intended to diagnose infection nor to guide or monitor treatment. Performed at [facility], [address] [place], [place] [address]          Radiology Studies: No results found.      Scheduled Meds:  ascorbic acid  500 mg Oral BID   feeding supplement  237 mL Oral Q24H   fenofibrate  54 mg Oral Q supper   FLUoxetine  40 mg Oral QHS   folic acid  1 mg Oral Daily   gabapentin  300 mg Oral TID   insulin aspart  0-9 Units Subcutaneous TID WC   insulin glargine-yfgn  10 Units Subcutaneous BID   lipase/protease/amylase  72,000 Units Oral TID WC   metoprolol tartrate  50 mg Oral TID   mometasone-formoterol  2 puff Inhalation BID   multivitamin with minerals  1 tablet Oral Daily   nutrition supplement (JUVEN)  1 packet Oral BID WC   pantoprazole  40 mg Oral Daily   propylthiouracil  50 mg Oral Daily   Ensure Max Protein  11 oz Oral BID   rOPINIRole  2 mg Oral QHS   zinc sulfate  220 mg Oral Daily   Continuous Infusions:     LOS: 6 days  Time spent: 35  minutes    Dorcas Carrow, MD [facility] Pager [phone]

## 2022-07-28 NOTE — Progress Notes (Signed)
Pt is 72 year old female, presented to ER for redness over the abdomen. Recent hospitalization for cellulitis involving the abdominal wall. Pt was discharged with an oral antibiotic. Over 2-3 days it worsened and redness spread over the abdomen. Pt admitted for cellulitis of the abdominal wall, AKI, and anemia. I&D completed, and a wound vac placed over the open wound. Vac change was completed today, the wound has decreased in size. Pt receives pain medication to 1 hour prior to dressing change, and requests more pain medication as soon as it's available after the dressing change. Pt is ready for discharge when she can tolerate the dressing change without any IV pain medications. Home wound vac delivered today, and home health will come out to home for dressing changes as ordered.   Bonnye Fava, Runner, broadcasting/film/video

## 2022-07-28 NOTE — Care Management Important Message (Signed)
Important Message  Patient Details IM Letter given Name: [name] MRN: [id number] Date of Birth: 1950/11/24   Medicare Important Message Given:  Yes     Caren Macadam 07/28/2022, 1:22 PM

## 2022-07-28 NOTE — Consult Note (Addendum)
  WOC Nurse follow-up consult Note: Surgical PA at the bedside to assess wound appearance during the dressing change.  Pt was medicated for pain prior to the procedure but it was still very painful and she had tears in her eyes. It will be difficult to tolerate at home if only on PO pain meds. Abd with full thickness wound to lower abd/upper groin just above labia, refer to previous progress notes for measurements.  Removed 2 pieces black foam and replaced with 1 piece black foam, since wound is becoming more shallow.  Increased amt beefy red and small amt bloody drainage during dressing change. Applied 2 barrier rings around lower wound edges to attempt to maintain a seal. Cont suction on at .   Extra Vac dresing, 2 barrier rings, and extra drape left in room for next change.  WOC team will plan to change again on Fri, then surgical team plans to discharge after the procedure is performed.  Pt will need home negative pressure wound therapy device ordered and home health assistance for dressing changes; they are considering twice a week after discharge.  Thank-you,  Cammie Mcgee MSN, RN, CWOCN, [place], CNS [phone]

## 2022-07-29 DIAGNOSIS — L03311 Cellulitis of abdominal wall: Secondary | ICD-10-CM | POA: Diagnosis not present

## 2022-07-29 LAB — CBC
HCT: 24.7 % — ABNORMAL LOW (ref 36.0–46.0)
Hemoglobin: 7.7 g/dL — ABNORMAL LOW (ref 12.0–15.0)
MCH: 26.5 pg (ref 26.0–34.0)
MCHC: 31.2 g/dL (ref 30.0–36.0)
MCV: 84.9 fL (ref 80.0–100.0)
Platelets: 161 10*3/uL (ref 150–400)
RBC: 2.91 MIL/uL — ABNORMAL LOW (ref 3.87–5.11)
RDW: 15 % (ref 11.5–15.5)
WBC: 2.7 10*3/uL — ABNORMAL LOW (ref 4.0–10.5)
nRBC: 1.1 % — ABNORMAL HIGH (ref 0.0–0.2)

## 2022-07-29 LAB — BASIC METABOLIC PANEL
Anion gap: 6 (ref 5–15)
BUN: 41 mg/dL — ABNORMAL HIGH (ref 8–23)
CO2: 27 mmol/L (ref 22–32)
Calcium: 8.6 mg/dL — ABNORMAL LOW (ref 8.9–10.3)
Chloride: 103 mmol/L (ref 98–111)
Creatinine, Ser: 1.51 mg/dL — ABNORMAL HIGH (ref 0.44–1.00)
GFR, Estimated: 37 mL/min — ABNORMAL LOW (ref >60)
Glucose, Bld: 131 mg/dL — ABNORMAL HIGH (ref 70–99)
Potassium: 4.7 mmol/L (ref 3.5–5.1)
Sodium: 136 mmol/L (ref 135–145)

## 2022-07-29 LAB — GLUCOSE, CAPILLARY
Glucose-Capillary: 101 mg/dL — ABNORMAL HIGH (ref 70–99)
Glucose-Capillary: 122 mg/dL — ABNORMAL HIGH (ref 70–99)
Glucose-Capillary: 120 mg/dL — ABNORMAL HIGH (ref 70–99)
Glucose-Capillary: 166 mg/dL — ABNORMAL HIGH (ref 70–99)

## 2022-07-29 MED ORDER — ONDANSETRON 4 MG PO TBDP
4.0000 mg | ORAL_TABLET | Freq: Four times a day (QID) | ORAL | Status: DC | PRN
  Administered 2022-07-29 – 2022-07-30 (×3): 4 mg via ORAL
  Filled 2022-07-29 (×3): qty 1

## 2022-07-29 NOTE — Progress Notes (Signed)
PROGRESS NOTE    [name]  ZOX:[id number] DOB: 1950-04-11 DOA: 07/21/2022 PCP: Rodrigo Ran, MD    Brief Narrative:  72 year old with history of diastolic congestive heart failure, type 2 diabetes on insulin, hypertension, hyperlipidemia, pancytopenia and morbid obesity presenting with worsening abdominal wound and surrounding cellulitis.  Recently admitted to [facility] hospital, MSSA infection of the abdominal wall and discharged on Zyvox.  In the emergency room pancytopenia.  CT scan abdomen pelvis with evidence of cellulitis without subcutaneous gas or abscess.  Started on IV antibiotics.  Underwent excision debridement on 5/3.  Remains in the hospital with painful abdominal wall wound and surgical management. Remains in the hospital for wound management.   Assessment & Plan:   Abdominal wall abscess and cellulitis: Chronic recurrent abdominal wall abscesses. Hospitalized at [facility] health septic shock 4/17-4/24, MSSA and rare Enterococcus.  Discharged on linezolid. Presented back to the hospital with worsening cellulitis.  CT scan concerning for cellulitis. Surgical debridement and wound VAC placement 5/3 by surgery. Currently on Ancef.  Blood cultures negative.  Wound cultures negative so far. Completed antibiotic therapy. Followed by surgery.  Wound VAC changed today.  Very painful. Patient to tolerate wound care before discharging home.  Acute kidney injury: Known creatinine baseline of 0.9-1.  Continue holding diuretics.  Vancomycin was discontinued.  Creatinine was gradually worsening however now plateauing.  Urine output is adequate.  Renal ultrasound is normal without evidence of hydronephrosis. Creatinine is stabilizing now.  Recheck tomorrow morning.  Anemia of acute blood loss, chronic iron deficiency anemia with baseline hemoglobin of 8-9. Expected blood loss anemia due to acute illness and surgical debridement.  Hemoglobin 6.4. Given 1 unit of PRBC.  Appropriately  responded.  Will recheck tomorrow.  Chronic medical issues including Controlled type 2 diabetes on insulin: Known A1c of 6.8.  Current insulin regimen continued. Chronic diastolic dysfunction: Known diastolic dysfunction with ejection fraction 60%.  On oral Lasix at home.  Appears euvolemic.  With AKI.  Holding Lasix.  Pancytopenia: Mostly chronic. Folic acid deficiency: Folic acid level 3.6.  she  was started on replacement.  Will prescribe on discharge. Essential hypertension: On metoprolol.  Stable.  Continue mobility.  Wound VAC change planned for 5/10 and possible discharge after that.     DVT prophylaxis: SCD's Start: 07/23/22 1303 SCDs Start: 07/22/22 0033   Code Status: Full code Family Communication: None at the bedside. Disposition Plan: Status is: Inpatient Remains inpatient appropriate because: Active wound care.     Consultants:  General surgery  Procedures:  Incision and drainage abdominal wound  Antimicrobials:  Ancef-completed 7 days of therapy.   Subjective:  Patient seen and examined.  No overnight events.  She does have incisional pain, uses Zofran along with Norco.  She wants to try oral Zofran.  She wants to try taking shower today.  She needs to walk more today.  She is anxious about dressing changes tomorrow.   Objective: Vitals:   07/29/22 0027 07/29/22 0414 07/29/22 0500 07/29/22 0805  BP: (!) 128/48 (!) 144/54    Pulse: (!) 59 (!) 54    Resp: 18 18    Temp: 98 F (36.7 C) 98 F (36.7 C)    TempSrc: Oral Oral    SpO2: 91% 96%  97%  Weight:   93.9 kg   Height:        Intake/Output Summary (Last 24 hours) at 07/29/2022 1050 Last data filed at 07/29/2022 0908 Gross per 24 hour  Intake 1560 ml  Output 2800 ml  Net -1240 ml    Filed Weights   07/26/22 0500 07/28/22 0440 07/29/22 0500  Weight: 92.5 kg 94 kg 93.9 kg    Examination:  General exam: Appears comfortable today.  Respiratory system: No added sounds. Cardiovascular system:  S1 & S2 heard, RRR.  Gastrointestinal system: Large and pendulous. Wound VAC on the right side lower abdomen and pannus.  Looks dry and clean.  No surrounding erythema. Central nervous system: Alert and oriented. No focal neurological deficits. Extremities: Symmetric 5 x 5 power.  Wound examined 5/8 by surgery, picture available in the chart.     Data Reviewed: I have personally reviewed following labs and imaging studies  CBC: Recent Labs  Lab 07/25/22 0506 07/26/22 0458 07/27/22 0447 07/27/22 1245 07/28/22 0456 07/29/22 0453  WBC 5.8 3.6* 3.8*  --  3.3* 2.7*  HGB 7.3* 7.3* 6.4* 7.8* 8.2* 7.7*  HCT 23.2* 22.9* 20.4* 24.4* 26.1* 24.7*  MCV 84.1 83.3 84.0  --  83.9 84.9  PLT 101* 119* 120*  --  152 161    Basic Metabolic Panel: Recent Labs  Lab 07/24/22 0650 07/25/22 0506 07/26/22 0458 07/27/22 0447 07/28/22 0456 07/29/22 0453  NA 131* 133* 133* 131* 136 136  K 5.0 4.9 4.4 4.5 4.7 4.7  CL 100 99 100 98 105 103  CO2 21* 24 25 25 26 27   GLUCOSE 184* 152* 152* 146* 132* 131*  BUN 48* 48* 45* 44* 42* 41*  CREATININE 1.77* 2.07* 1.70* 1.82* 1.65* 1.51*  CALCIUM 8.9 9.0 8.8* 8.4* 8.8* 8.6*  MG 2.0 2.0 1.9 1.8 2.2  --   PHOS  --  3.6 3.7 3.5 3.6  --     GFR: Estimated Creatinine Clearance: 35.5 mL/min (A) (by C-G formula based on SCr of 1.51 mg/dL (H)). Liver Function Tests: Recent Labs  Lab 07/24/22 0650 07/25/22 0506  AST 12*  --   ALT 9  --   ALKPHOS 54  --   BILITOT 0.5  --   PROT 7.5  --   ALBUMIN 3.2* 3.1*    No results for input(s): "LIPASE", "AMYLASE" in the last 168 hours. No results for input(s): "AMMONIA" in the last 168 hours. Coagulation Profile: No results for input(s): "INR", "PROTIME" in the last 168 hours.  Cardiac Enzymes: No results for input(s): "CKTOTAL", "CKMB", "CKMBINDEX", "TROPONINI" in the last 168 hours. BNP (last 3 results) No results for input(s): "PROBNP" in the last 8760 hours. HbA1C: No results for input(s): "HGBA1C"  in the last 72 hours. CBG: Recent Labs  Lab 07/28/22 0720 07/28/22 1134 07/28/22 1610 07/28/22 2201 07/29/22 0743  GLUCAP 124* 120* 157* 196* 101*    Lipid Profile: No results for input(s): "CHOL", "HDL", "LDLCALC", "TRIG", "CHOLHDL", "LDLDIRECT" in the last 72 hours. Thyroid Function Tests: No results for input(s): "TSH", "T4TOTAL", "FREET4", "T3FREE", "THYROIDAB" in the last 72 hours. Anemia Panel: No results for input(s): "VITAMINB12", "FOLATE", "FERRITIN", "TIBC", "IRON", "RETICCTPCT" in the last 72 hours.  Sepsis Labs: No results for input(s): "PROCALCITON", "LATICACIDVEN" in the last 168 hours.   Recent Results (from the past 240 hour(s))  Blood Culture (routine x 2)     Status: None   Collection Time: 07/21/22 10:00 PM   Specimen: BLOOD  Result Value Ref Range Status   Specimen Description   Final    BLOOD SITE NOT SPECIFIED Performed at [facility], [address], [place], [place] [address]    Special Requests   Final  BOTTLES DRAWN AEROBIC AND ANAEROBIC Blood Culture results may not be optimal due to an excessive volume of blood received in culture bottles Performed at [facility], [address], [place], [place] [address]    Culture   Final    NO GROWTH 5 DAYS Performed at [facility], [address] [place], [place] [address]    Report Status 07/26/2022 FINAL  Final  Blood Culture (routine x 2)     Status: None   Collection Time: 07/21/22 10:08 PM   Specimen: BLOOD  Result Value Ref Range Status   Specimen Description   Final    BLOOD SITE NOT SPECIFIED Performed at [facility], [address], [place], [place] [address]    Special Requests   Final    BOTTLES DRAWN AEROBIC AND ANAEROBIC Blood Culture results may not be optimal due to an excessive volume of blood received in culture bottles Performed at [facility], [address], [place], [place] [address]     Culture   Final    NO GROWTH 5 DAYS Performed at [facility], [address] [place], [place] [address]    Report Status 07/26/2022 FINAL  Final  Surgical pcr screen     Status: None   Collection Time: 07/22/22  4:29 PM   Specimen: Nasal Mucosa; Nasal Swab  Result Value Ref Range Status   MRSA, PCR NEGATIVE NEGATIVE Final   Staphylococcus aureus NEGATIVE NEGATIVE Final    Comment: (NOTE) The Xpert SA Assay (FDA approved for NASAL specimens in patients 9 years of age and older), is one component of a comprehensive surveillance program. It is not intended to diagnose infection nor to guide or monitor treatment. Performed at [facility], [address], [place], [place] [address]          Radiology Studies: No results found.      Scheduled Meds:  ascorbic acid  500 mg Oral BID   fenofibrate  54 mg Oral Q supper   FLUoxetine  40 mg Oral QHS   folic acid  1 mg Oral Daily   gabapentin  300 mg Oral TID   insulin aspart  0-9 Units Subcutaneous TID WC   insulin glargine-yfgn  10 Units Subcutaneous BID   lipase/protease/amylase  72,000 Units Oral TID WC   metoprolol tartrate  50 mg Oral TID   mometasone-formoterol  2 puff Inhalation BID   multivitamin with minerals  1 tablet Oral Daily   nutrition supplement (JUVEN)  1 packet Oral BID WC   pantoprazole  40 mg Oral Daily   propylthiouracil  50 mg Oral Daily   Ensure Max Protein  11 oz Oral BID   rOPINIRole  2 mg Oral QHS   zinc sulfate  220 mg Oral Daily   Continuous Infusions:     LOS: 7 days    Time spent: 35 minutes    Dorcas Carrow, MD [facility] Pager [phone]

## 2022-07-30 LAB — CBC
HCT: 24.4 % — ABNORMAL LOW (ref 36.0–46.0)
Hemoglobin: 7.6 g/dL — ABNORMAL LOW (ref 12.0–15.0)
MCH: 26.5 pg (ref 26.0–34.0)
MCHC: 31.1 g/dL (ref 30.0–36.0)
MCV: 85 fL (ref 80.0–100.0)
Platelets: 169 10*3/uL (ref 150–400)
RBC: 2.87 MIL/uL — ABNORMAL LOW (ref 3.87–5.11)
RDW: 15.2 % (ref 11.5–15.5)
WBC: 3.2 10*3/uL — ABNORMAL LOW (ref 4.0–10.5)
nRBC: 0.6 % — ABNORMAL HIGH (ref 0.0–0.2)

## 2022-07-30 LAB — BASIC METABOLIC PANEL
Anion gap: 6 (ref 5–15)
BUN: 33 mg/dL — ABNORMAL HIGH (ref 8–23)
CO2: 28 mmol/L (ref 22–32)
Calcium: 8.8 mg/dL — ABNORMAL LOW (ref 8.9–10.3)
Chloride: 103 mmol/L (ref 98–111)
Creatinine, Ser: 1.21 mg/dL — ABNORMAL HIGH (ref 0.44–1.00)
GFR, Estimated: 48 mL/min — ABNORMAL LOW (ref >60)
Glucose, Bld: 123 mg/dL — ABNORMAL HIGH (ref 70–99)
Potassium: 4.7 mmol/L (ref 3.5–5.1)
Sodium: 137 mmol/L (ref 135–145)

## 2022-07-30 LAB — GLUCOSE, CAPILLARY: Glucose-Capillary: 99 mg/dL (ref 70–99)

## 2022-07-30 MED ORDER — ACETAMINOPHEN 500 MG PO TABS: 1000.0000 mg | ORAL_TABLET | Freq: Four times a day (QID) | ORAL | Status: DC | PRN

## 2022-07-30 MED ORDER — FOLIC ACID 1 MG PO TABS: 1.0000 mg | ORAL_TABLET | Freq: Every day | ORAL | 0 refills | Status: AC

## 2022-07-30 MED ORDER — ADULT MULTIVITAMIN W/MINERALS CH: 1.0000 | ORAL_TABLET | Freq: Every day | ORAL | 0 refills | Status: AC

## 2022-07-30 MED ORDER — OXYCODONE HCL 10 MG PO TABS: 10.0000 mg | ORAL_TABLET | ORAL | 0 refills | Status: DC | PRN

## 2022-07-30 MED ORDER — ONDANSETRON 4 MG PO TBDP: 4.0000 mg | ORAL_TABLET | Freq: Four times a day (QID) | ORAL | 0 refills | Status: DC | PRN

## 2022-07-30 NOTE — Progress Notes (Signed)
OT Cancellation Note  Patient Details Name: [name] MRN: [id number] DOB: 01-17-1951   Cancelled Treatment:    Reason Eval/Treat Not Completed: Other (comment).  Attempted skilled OT session.  Pt. States "I'm about to go home" and declines OT at this time.    [name] Lorraine-COTA/L 07/30/2022, 10:08 AM

## 2022-07-30 NOTE — TOC Transition Note (Addendum)
Transition of Care [facility]) - CM/SW Discharge Note   Patient Details  Name: [name] MRN: [id number] Date of Birth: [birth year]  [facility]) CM/SW Contact:  Amada Jupiter, LCSW Phone Number: 07/30/2022, 10:27 AM   Clinical Narrative:     Pt is medically cleared for dc home today.  [facility] VAC delivered to room and [facility] to resume HHRN/ PT services with start of care 5/13 - MD aware.  No further TOC needs.  Final next level of care: Home w Home Health Services Barriers to Discharge: Barriers Resolved   Patient Goals and CMS Choice      Discharge Placement                         Discharge Plan and Services Additional resources added to the After Visit Summary for     Discharge Planning Services: CM Consult            DME Arranged: Negative pressure wound device DME Agency: [facility] Date DME Agency Contacted: 07/27/22   Representative spoke with at DME Agency: Blossom Hoops HH Arranged: RN, PT [facility] Agency: [facility] Date [facility] Agency Contacted: 07/27/22   Representative spoke with at [facility] Agency: Barbara Cower  Social Determinants of Health (SDOH) Interventions SDOH Screenings   Food Insecurity: No Food Insecurity (07/22/2022)  Housing: Low Risk  (07/22/2022)  Transportation Needs: No Transportation Needs (07/22/2022)  Utilities: Not At Risk (07/22/2022)  Depression (PHQ2-9): Low Risk  (05/27/2022)  Financial Resource Strain: Low Risk  (12/24/2017)  Physical Activity: Inactive (12/24/2017)  Social Connections: Unknown (12/24/2017)  Stress: Stress Concern Present (12/24/2017)  Tobacco Use: Low Risk  (07/24/2022)     Readmission Risk Interventions    07/30/2022   10:24 AM 03/12/2022   11:50 AM  Readmission Risk Prevention Plan  Transportation Screening Complete Complete  PCP or Specialist Appt within 5-7 Days  Complete  PCP or Specialist Appt within 3-5 Days Complete   Home Care Screening  Complete  Medication Review (RN CM)  Complete  HRI or  Home Care Consult Complete   Social Work Consult for Recovery Care Planning/Counseling Complete   Medication Review Oceanographer) Complete

## 2022-07-30 NOTE — Progress Notes (Signed)
7 Days Post-Op  Subjective: Seen with WOC RN at time of vac change.  Having pain, but doing ok.  Objective: Vital signs in last 24 hours: Temp:  [97.9 F (36.6 C)-99.1 F (37.3 C)] 97.9 F (36.6 C) (05/10 0555) Pulse Rate:  [54-64] 54 (05/10 0555) Resp:  [18] 18 (05/10 0555) BP: (131-171)/(47-61) 171/61 (05/10 0555) SpO2:  [91 %-98 %] 98 % (05/10 0555) FiO2 (%):  [21 %] 21 % (05/09 2347) Last BM Date : 07/29/22  Intake/Output from previous day: 05/09 0701 - 05/10 0700 In: 1440 [P.O.:1440] Out: 1250 [Urine:1250] Intake/Output this shift: No intake/output data recorded.  PE: Abd: wound with overall good granulation tissue. A few areas of fibrinous tissue. No tunneling of wound. No surrounding erythema or induration. Healthy bleeding. See below    Lab Results:  Recent Labs    07/29/22 0453 07/30/22 0459  WBC 2.7* 3.2*  HGB 7.7* 7.6*  HCT 24.7* 24.4*  PLT 161 169   BMET Recent Labs    07/29/22 0453 07/30/22 0459  NA 136 137  K 4.7 4.7  CL 103 103  CO2 27 28  GLUCOSE 131* 123*  BUN 41* 33*  CREATININE 1.51* 1.21*  CALCIUM 8.6* 8.8*   PT/INR No results for input(s): "LABPROT", "INR" in the last 72 hours. CMP     Component Value Date/Time   NA 137 07/30/2022 0459   K 4.7 07/30/2022 0459   CL 103 07/30/2022 0459   CO2 28 07/30/2022 0459   GLUCOSE 123 (H) 07/30/2022 0459   BUN 33 (H) 07/30/2022 0459   CREATININE 1.21 (H) 07/30/2022 0459   CREATININE 0.97 05/19/2022 0913   CREATININE 0.85 10/11/2018 1609   CALCIUM 8.8 (L) 07/30/2022 0459   PROT 7.5 07/24/2022 0650   ALBUMIN 3.1 (L) 07/25/2022 0506   AST 12 (L) 07/24/2022 0650   AST 14 (L) 05/19/2022 0913   ALT 9 07/24/2022 0650   ALT 10 05/19/2022 0913   ALKPHOS 54 07/24/2022 0650   BILITOT 0.5 07/24/2022 0650   BILITOT 0.4 05/19/2022 0913   GFRNONAA 48 (L) 07/30/2022 0459   GFRNONAA >60 05/19/2022 0913   GFRAA 56 (L) 10/02/2019 1122   Lipase     Component Value Date/Time   LIPASE 22  06/02/2018 0704       Studies/Results: No results found.  Anti-infectives: Anti-infectives (From admission, onward)    Start     Dose/Rate Route Frequency Ordered Stop   07/25/22 1800  ceFAZolin (ANCEF) IVPB 2g/100 mL premix  Status:  Discontinued        2 g 200 mL/hr over 30 Minutes Intravenous Every 12 hours 07/25/22 0824 07/28/22 1149   07/24/22 0815  ceFAZolin (ANCEF) IVPB 2g/100 mL premix  Status:  Discontinued        2 g 200 mL/hr over 30 Minutes Intravenous Every 8 hours 07/24/22 0717 07/25/22 0824   07/23/22 2200  vancomycin (VANCOCIN) IVPB 1000 mg/200 mL premix  Status:  Discontinued        1,000 mg 200 mL/hr over 60 Minutes Intravenous Every 48 hours 07/21/22 2329 07/22/22 0812   07/23/22 2200  vancomycin (VANCOREADY) IVPB 1250 mg/250 mL  Status:  Discontinued        1,250 mg 166.7 mL/hr over 90 Minutes Intravenous Every 48 hours 07/22/22 1104 07/24/22 0717   07/22/22 1000  linezolid (ZYVOX) IVPB 600 mg  Status:  Discontinued        600 mg 300 mL/hr over 60 Minutes Intravenous Every  12 hours 07/22/22 0813 07/22/22 0843   07/22/22 0045  cefTRIAXone (ROCEPHIN) 1 g in sodium chloride 0.9 % 100 mL IVPB  Status:  Discontinued        1 g 200 mL/hr over 30 Minutes Intravenous Every 24 hours 07/22/22 0034 07/24/22 0717   07/21/22 2300  vancomycin (VANCOCIN) IVPB 1000 mg/200 mL premix        1,000 mg 200 mL/hr over 60 Minutes Intravenous  Once 07/21/22 2259 07/22/22 0013        Assessment/Plan POD 7, s/p excisional debridement of abdominal wall wound, Dr. Gerrit Friends 07/23/22 -cont VAC and HH arranged for twice a week VAC changes -no further abx therapy needed -will have her follow up as an outpatient for wound recheck. -surgically stable for DC home  FEN - carb mod VTE - on hold due to anemia ID - ancef completed  I reviewed hospitalist notes, last 24 h vitals and pain scores, last 48 h intake and output, last 24 h labs and trends, and last 24 h imaging results.   LOS:  8 days    Letha Cape , [facility] 07/30/2022, 8:41 AM Please see Amion for pager number during day hours 7:00am-4:30pm or 7:00am -11:30am on weekends

## 2022-07-30 NOTE — Consult Note (Signed)
WOC Nurse wound follow up; this visit made with Barnetta Chapel, PA, CCS  Wound type:infectious post debridement 07/23/2022  Measurement: see 07/26/2022 note  Wound bed:80% beefy red 20% yellow fibrinous tissue  Drainage (amount, consistency, odor) moderate amount of serosanguinous  Periwound: intact  Dressing procedure/placement/frequency: Removed old NPWT dressing Cleansed wound with normal saline Periwound skin protected with skin barrier wipe  Utilized (2) 2" barrier rings flattening one and placing at left most distal portion of wound and then flattening second and placing at right portion that is on mons Filled wound with 1 piece of black foam  Sealed NPWT dressing at HG Patient received PO pain medication per bedside nurse prior to dressing change Patient tolerated procedure fair; does continue to have pain and is very fearful of dressing change  Did review when at home if NPWT becomes not operational she needs to notify home health nurse and if unable to come out within 2 hours remove the dressing and perform a saline wet to dry dressing (supplies put with home supplies).  We also discussed showering and PA did approve patient to remove NPWT and shower with vac off as long as home health nurse would be coming to replace NPWT that day.  Patient with lots of questions and concerns which this RN attempted to answer to the best of her ability.  Earl Gala PA also answered many questions for patient.   Patient has home NPWT vac in room and will be connected to this at discharge.  Supplies for home use in room as well.   WOC nurse will continue to provide NPWT dressing changed due to the complexity of the dressing change if patient remains inpatient. Next NPWT dressing change Monday 08/02/2022.   Thank you,    Priscella Mann MSN, RN-BC, [employer] [phone]

## 2022-07-30 NOTE — Discharge Summary (Signed)
Physician Discharge Summary  [name] ZOX:[id number] DOB: [birth year] DOA: 07/21/2022  PCP: Rodrigo Ran, MD  Admit date: 07/21/2022 Discharge date: 07/30/2022  Admitted From: Home Disposition: Home with home health  Recommendations for Outpatient Follow-up:  Follow up with PCP in 1-2 weeks Please obtain BMP/CBC in one week Home health him to follow-up  Home Health: PT/OT/ RN Equipment/Devices: Portable wound VAC  Discharge Condition: Stable CODE STATUS: Full code Diet recommendation: Low-carb diet, nutritional supplements.  Protein supplementations.  Discharge summary: 72 year old with history of diastolic congestive heart failure, type 2 diabetes on insulin, hypertension, hyperlipidemia, pancytopenia and morbid obesity presented with worsening abdominal wound and surrounding cellulitis.  Recently admitted to [facility], MSSA infection of the abdominal wall and discharged on Zyvox.  In the emergency room pancytopenia.  CT scan abdomen pelvis with evidence of cellulitis without subcutaneous gas or abscess.  Started on IV antibiotics.  Underwent excision debridement on 5/3.  Remained in the hospital with painful abdominal wall wound and surgical management. Stable to discharge home today.  # Abdominal wall abscess and cellulitis: Chronic recurrent abdominal wall abscesses. Hospitalized at [facility] health septic shock 4/17-4/24, MSSA and rare Enterococcus.  Discharged on linezolid. Presented back to the hospital with worsening cellulitis.  CT scan concerning for cellulitis. Surgical debridement and wound VAC placement 5/3 by surgery. Treated with Ancef.  Completed 7 days of antibiotic therapy.  Blood cultures negative.  Surgical cultures negative. Patient discharged home today on portable wound VAC to be followed by home health RN. Patient will need pain medications especially before dressing changes and with mobility.  She also uses nausea medicine before taking pain medications.    Acute kidney injury: Known creatinine baseline of 0.9-1.  Patient had multifactorial acute kidney injury.  Treated with IV fluids.  Gradually improved and creatinine 1.3 today.  Urine output is adequate.  Renal ultrasound is normal without evidence of hydronephrosis. Creatinine is stabilizing now.  Recheck in 1 week.  She will go back on furosemide.   Anemia of acute blood loss, chronic iron deficiency anemia with baseline hemoglobin of 8-9. Expected blood loss anemia due to acute illness and surgical debridement.  Hemoglobin 6.4. Given 1 unit of PRBC.  Appropriately responded.  No further evidence of blood loss.   Chronic medical issues including Controlled type 2 diabetes on insulin: Known A1c of 6.8.  Current insulin regimen continued. Chronic diastolic dysfunction: Known diastolic dysfunction with ejection fraction 60%.  On oral Lasix at home.  Appears euvolemic.   Pancytopenia: Mostly chronic. Folic acid deficiency: Folic acid level 3.6.  she  was started on replacement.  Will prescribe on discharge. Essential hypertension: On metoprolol.  Stable.   Stable discharge.  Wound care and surgical follow-up.   Discharge Diagnoses:  Principal Problem:   Cellulitis of abdominal wall Active Problems:   DM2 (diabetes mellitus, type 2) (HCC)   Hyperlipidemia   AKI (acute kidney injury) (HCC)   Graves disease   Essential hypertension   Pancytopenia (HCC)   Stage 3a chronic kidney disease (CKD) (HCC)   Acute prerenal azotemia   Moderate persistent asthma   Chronic diastolic CHF (congestive heart failure) (HCC)    Discharge Instructions  Discharge Instructions     Diet - low sodium heart healthy   Complete by: As directed    Diet Carb Modified   Complete by: As directed    Discharge wound care:   Complete by: As directed    Wound vac with HHRN only   Increase  activity slowly   Complete by: As directed       Allergies as of 07/30/2022       Reactions   Sumatriptan Other  (See Comments), Anaphylaxis   Vascular spasms Other Reaction(s): Other Facial spasms, Vasospasms   Codeine Nausea And Vomiting   Other Reaction(s): GI Intolerance   Nsaids Other (See Comments)   PT UNABLE TO TOLERATE NSAID'S DRUGS DUE TO HX OF GASTRIC SLEEVE SURGERY   Statins Other (See Comments)   Leg/muscle pain   Sulfonamide Derivatives Hives   Childhood allergy   Hydrocodone-acetaminophen Nausea And Vomiting   Other Reaction(s): GI Intolerance   Methimazole Hives   Niacin    heart pounding and beating fast.   Promethazine Hcl Other (See Comments)   Restless leg feeling all over body        Medication List     STOP taking these medications    Acidophilus/Pectin Caps   diclofenac 75 MG EC tablet Commonly known as: VOLTAREN   hyoscyamine 0.125 MG SL tablet Commonly known as: LEVSIN SL   ondansetron 4 MG tablet Commonly known as: Zofran   oxyCODONE-acetaminophen 5-325 MG tablet Commonly known as: PERCOCET/ROXICET       TAKE these medications    acetaminophen 500 MG tablet Commonly known as: TYLENOL Take 2 tablets (1,000 mg total) by mouth every 6 (six) hours as needed for mild pain (or Fever >/= 101).   amLODipine 5 MG tablet Commonly known as: NORVASC Take 5 mg by mouth at bedtime.   Basaglar KwikPen 100 UNIT/ML Inject 20 Units into the skin 2 (two) times daily.   BD Insulin Syringe U/F 31G X 5/16" 0.5 ML Misc Generic drug: Insulin Syringe-Needle U-100 USE 5 TIMES A DAY LEVEMIR AND NOVOLOG   BD Pen Needle Micro U/F 32G X 6 MM Misc Generic drug: Insulin Pen Needle Inject 32 pens into the skin daily.   clonazePAM 0.5 MG tablet Commonly known as: KLONOPIN Take 0.5 mg by mouth See admin instructions. Take 0.5 mg at bedtime, may take an additional 0.5 mg tablet twice daily as needed for anxiety   Creon 36000 UNITS Cpep capsule Generic drug: lipase/protease/amylase Take 72,000 Units by mouth 3 (three) times daily with meals. 2 capsules with each meal  and 1 capsule with snacks   Dialyvite Vitamin D 5000 125 MCG (5000 UT) capsule Generic drug: Cholecalciferol Take 5,000 Units by mouth daily.   doxazosin 2 MG tablet Commonly known as: CARDURA Take 2 mg by mouth daily.   fenofibrate 54 MG tablet Take 54 mg by mouth daily with supper.   FLUoxetine 20 MG capsule Commonly known as: PROZAC Take 40 mg by mouth at bedtime.   folic acid 1 MG tablet Commonly known as: FOLVITE Take 1 tablet (1 mg total) by mouth daily. Start taking on: Jul 31, 2022   FREESTYLE TEST STRIPS test strip Generic drug: glucose blood USE STRIPS TO TEST BLOOD GLUCOSE BID   furosemide 40 MG tablet Commonly known as: LASIX Take 40 mg by mouth daily.   gabapentin 300 MG capsule Commonly known as: NEURONTIN Take 1 capsule (300 mg total) by mouth 3 (three) times daily for 30 days.   insulin aspart 100 UNIT/ML FlexPen Commonly known as: NOVOLOG Sliding scale dose: CBG < 70: implement hypoglycemia protocol  CBG 70 - 120: 0 units  CBG 121 - 150: 0 units  CBG 151 - 200: 0 units  CBG 201 - 250: 2 units  CBG 251 - 300: 3 units  CBG 301 - 350: 4 units  CBG 351 - 400: 5 units What changed:  how much to take how to take this when to take this reasons to take this additional instructions   magnesium oxide 400 (240 Mg) MG tablet Commonly known as: MAG-OX Take 1 tablet by mouth daily.   metoprolol tartrate 50 MG tablet Commonly known as: LOPRESSOR Take 50 mg by mouth 3 (three) times daily.   multivitamin with minerals Tabs tablet Take 1 tablet by mouth daily. Start taking on: Jul 31, 2022   nutrition supplement (JUVEN) Pack Take 1 packet by mouth 2 (two) times daily between meals.   omeprazole 20 MG capsule Commonly known as: PRILOSEC Take 20 mg by mouth daily with supper.   ondansetron 4 MG disintegrating tablet Commonly known as: ZOFRAN-ODT Take 1 tablet (4 mg total) by mouth every 6 (six) hours as needed for nausea or vomiting.    oxybutynin 10 MG 24 hr tablet Commonly known as: DITROPAN-XL Take 10 mg by mouth daily.   Oxycodone HCl 10 MG Tabs Take 1 tablet (10 mg total) by mouth every 4 (four) hours as needed for moderate pain.   propylthiouracil 50 MG tablet Commonly known as: PTU Take 50 mg by mouth daily.   rOPINIRole 2 MG tablet Commonly known as: REQUIP Take 2 mg by mouth See admin instructions. Take 2 mg at bedtime, may take a second 2 mg dose as needed for restless legs   Symbicort 160-4.5 MCG/ACT inhaler Generic drug: budesonide-formoterol Inhale 2 puffs into the lungs 2 (two) times daily.   tiZANidine 4 MG tablet Commonly known as: ZANAFLEX Take 4 mg by mouth 2 (two) times daily as needed for muscle spasms.   Unistik 3 Comfort Misc USE LANCETS TO CHECK BLOOD GLUCOSE BID   Ventolin HFA 108 (90 Base) MCG/ACT inhaler Generic drug: albuterol Inhale 2 puffs into the lungs every 4 (four) hours as needed for shortness of breath.               Discharge Care Instructions  (From admission, onward)           Start     Ordered   07/30/22 0000  Discharge wound care:       Comments: Wound vac with HHRN only   07/30/22 1011            Follow-up Information     Darnell Level, MD. Go to.   Specialty: General Surgery Why: 08/24/22 at 2:30 pm  Please arrive 30 minutes early to complete check in, and bring photo ID and insurance card. Contact information: [address] [place] [address] [phone]                Allergies  Allergen Reactions   Sumatriptan Other (See Comments) and Anaphylaxis    Vascular spasms  Other Reaction(s): Other  Facial spasms, Vasospasms   Codeine Nausea And Vomiting    Other Reaction(s): GI Intolerance   Nsaids Other (See Comments)    PT UNABLE TO TOLERATE NSAID'S DRUGS DUE TO HX OF GASTRIC SLEEVE SURGERY   Statins Other (See Comments)    Leg/muscle pain   Sulfonamide Derivatives Hives    Childhood allergy     Hydrocodone-Acetaminophen Nausea And Vomiting    Other Reaction(s): GI Intolerance   Methimazole Hives   Niacin     heart pounding and beating fast.   Promethazine Hcl Other (See Comments)    Restless leg feeling all over body  Consultations: General surgery   Procedures/Studies: US RENAL  Result Date: 07/25/2022 CLINICAL DATA:  [id number] AKI (acute kidney injury) (HCC) [id number] EXAM: RENAL / URINARY TRACT ULTRASOUND COMPLETE COMPARISON:  Jul 21, 2022. FINDINGS: Evaluation is limited by body habitus and limited acoustic windows. Right Kidney: Renal measurements: 11.9 x 7.1 x 6.5 cm = volume: 288 mL. Echogenicity within normal limits. No definitive mass or hydronephrosis visualized. Left Kidney: Renal measurements: 11.1 x 6.3 x 5.2 cm = volume: 189 mL. Echogenicity within normal limits. No mass or hydronephrosis visualized. Bladder: Appears normal for degree of bladder distention. Other: None. IMPRESSION: No definitive hydronephrosis. Electronically Signed   By: Meda Klinefelter M.D.   On: 07/25/2022 15:47   CT ABDOMEN PELVIS W CONTRAST  Result Date: 07/21/2022 CLINICAL DATA:  Abdominal wall wound. Evaluate for deep space infection. EXAM: CT ABDOMEN AND PELVIS WITH CONTRAST TECHNIQUE: Multidetector CT imaging of the abdomen and pelvis was performed using the standard protocol following bolus administration of intravenous contrast. RADIATION DOSE REDUCTION: This exam was performed according to the departmental dose-optimization program which includes automated exposure control, adjustment of the mA and/or kV according to patient size and/or use of iterative reconstruction technique. CONTRAST:  75mL OMNIPAQUE IOHEXOL 300 MG/ML  SOLN COMPARISON:  CT abdomen and pelvis 10/06/2021 FINDINGS: Lower chest: No acute abnormality. Hepatobiliary: The liver is enlarged, unchanged. The gallbladder is surgically absent. There is no biliary ductal dilatation. Pancreas: Rounded 14 mm low-attenuation lesion in the head  of the pancreas has slightly decreased in size. Pancreas is otherwise within normal limits. Spleen: Moderately enlarged, unchanged. Adrenals/Urinary Tract: There are punctate nonobstructing bilateral renal calculi. There is no hydronephrosis or perinephric stranding. The adrenal glands and bladder are within normal limits. Stomach/Bowel: No evidence of bowel wall thickening, distention, or inflammatory changes. The appendix is not seen. There is a large amount of stool throughout the colon. There surgical changes in the stomach. Vascular/Lymphatic: Aortic atherosclerosis. No enlarged abdominal or pelvic lymph nodes. Reproductive: Uterus is smaller absent.  Adnexa are unremarkable. Other: There is skin thickening and subcutaneous stranding of the anterior inferior abdominal wall. There is no evidence for soft tissue gas or abscess. There is a small inferior left ventral hernia containing nondilated bowel, unchanged. There is no ascites. Musculoskeletal: Thoracolumbar fusion hardware is present. IMPRESSION: 1. Findings compatible with cellulitis of the anterior inferior abdominal wall. No evidence for soft tissue gas or abscess. 2. Stable hepatosplenomegaly. 3. Nonobstructing bilateral renal calculi. 4. Stable small left ventral hernia containing nondilated bowel. 5. 14 mm cystic lesion in the head of the pancreas has slightly decreased in size. Recommend follow-up MRI in 1 year. Aortic Atherosclerosis (ICD10-I70.0). Electronically Signed   By: Darliss Cheney M.D.   On: 07/21/2022 23:02   DG Chest Port 1 View  Result Date: 07/21/2022 CLINICAL DATA:  Questionable sepsis. EXAM: PORTABLE CHEST 1 VIEW COMPARISON:  Portable chest 03/10/2022 FINDINGS: Mild cardiomegaly without evidence of CHF. The mediastinum is normally outlined. Minimal calcification of the transverse aorta. There are scattered linear scar-like opacities in the lower lung fields. No focal pneumonia is evident or pleural effusion. There are no acute  osseous findings. There is partially visible lower thoracic/lumbar fusion hardware and a reverse right shoulder arthroplasty with left shoulder DJD and chronic rotator cuff arthropathy. Degenerative change thoracic spine. IMPRESSION: 1. Cardiomegaly without evidence of CHF or pneumonia. 2. Aortic atherosclerosis. 3. Chronic changes as above. Electronically Signed   By: Almira Bar M.D.   On: 07/21/2022 21:57   (Echo,  Carotid, EGD, Colonoscopy, ERCP)    Subjective: Patient seen in the morning rounds.  She just had undergone wound VAC change with surgical team.  She was still hurting but better than earlier.  She thinks she can manage this at home.   Discharge Exam: Vitals:   07/29/22 2049 07/30/22 0555  BP: (!) 147/47 (!) 171/61  Pulse: 64 (!) 54  Resp: 18 18  Temp: 99.1 F (37.3 C) 97.9 F (36.6 C)  SpO2: 91% 98%   Vitals:   07/29/22 0805 07/29/22 1311 07/29/22 2049 07/30/22 0555  BP:  (!) 131/58 (!) 147/47 (!) 171/61  Pulse:  64 64 (!) 54  Resp:   18 18  Temp:  98.4 F (36.9 C) 99.1 F (37.3 C) 97.9 F (36.6 C)  TempSrc:  Oral Oral   SpO2: 97% 92% 91% 98%  Weight:      Height:        General: Pt is alert, awake, not in acute distress Appropriately anxious after the procedure. Cardiovascular: RRR, S1/S2 +, no rubs, no gallops Respiratory: CTA bilaterally, no wheezing, no rhonchi Abdominal: Soft, NT,  Right lower quadrant abdominal wall wound with wound VAC. Picture in the chart.    The results of significant diagnostics from this hospitalization (including imaging, microbiology, ancillary and laboratory) are listed below for reference.     Microbiology: Recent Results (from the past 240 hour(s))  Blood Culture (routine x 2)     Status: None   Collection Time: 07/21/22 10:00 PM   Specimen: BLOOD  Result Value Ref Range Status   Specimen Description   Final    BLOOD SITE NOT SPECIFIED Performed at [facility], [address]  [place], [place] [address]    Special Requests   Final    BOTTLES DRAWN AEROBIC AND ANAEROBIC Blood Culture results may not be optimal due to an excessive volume of blood received in culture bottles Performed at [facility], [address], [place], [place] [address]    Culture   Final    NO GROWTH 5 DAYS Performed at [facility], [address], [place], [place] [address]    Report Status 07/26/2022 FINAL  Final  Blood Culture (routine x 2)     Status: None   Collection Time: 07/21/22 10:08 PM   Specimen: BLOOD  Result Value Ref Range Status   Specimen Description   Final    BLOOD SITE NOT SPECIFIED Performed at [facility], [address], [place], [place] [address]    Special Requests   Final    BOTTLES DRAWN AEROBIC AND ANAEROBIC Blood Culture results may not be optimal due to an excessive volume of blood received in culture bottles Performed at [facility], [address], [place], [place] [address]    Culture   Final    NO GROWTH 5 DAYS Performed at [facility], [address], [place], [place] [address]    Report Status 07/26/2022 FINAL  Final  Surgical pcr screen     Status: None   Collection Time: 07/22/22  4:29 PM   Specimen: Nasal Mucosa; Nasal Swab  Result Value Ref Range Status   MRSA, PCR NEGATIVE NEGATIVE Final   Staphylococcus aureus NEGATIVE NEGATIVE Final    Comment: (NOTE) The Xpert SA Assay (FDA approved for NASAL specimens in patients 48 years of age and older), is one component of a comprehensive surveillance program. It is not intended to diagnose  infection nor to guide or monitor treatment. Performed at [facility], [address], [place], [place] [address]      Labs: BNP (last 3 results) Recent Labs    02/21/22 1706  BNP 530.2*   Basic Metabolic Panel: Recent Labs  Lab 07/24/22 0650 07/25/22 0506 07/26/22 0458 07/27/22 0447 07/28/22 0456  07/29/22 0453 07/30/22 0459  NA 131* 133* 133* 131* 136 136 137  K 5.0 4.9 4.4 4.5 4.7 4.7 4.7  CL 100 99 100 98 105 103 103  CO2 21* 24 25 25 26 27 28   GLUCOSE 184* 152* 152* 146* 132* 131* 123*  BUN 48* 48* 45* 44* 42* 41* 33*  CREATININE 1.77* 2.07* 1.70* 1.82* 1.65* 1.51* 1.21*  CALCIUM 8.9 9.0 8.8* 8.4* 8.8* 8.6* 8.8*  MG 2.0 2.0 1.9 1.8 2.2  --   --   PHOS  --  3.6 3.7 3.5 3.6  --   --    Liver Function Tests: Recent Labs  Lab 07/24/22 0650 07/25/22 0506  AST 12*  --   ALT 9  --   ALKPHOS 54  --   BILITOT 0.5  --   PROT 7.5  --   ALBUMIN 3.2* 3.1*   No results for input(s): "LIPASE", "AMYLASE" in the last 168 hours. No results for input(s): "AMMONIA" in the last 168 hours. CBC: Recent Labs  Lab 07/26/22 0458 07/27/22 0447 07/27/22 1245 07/28/22 0456 07/29/22 0453 07/30/22 0459  WBC 3.6* 3.8*  --  3.3* 2.7* 3.2*  HGB 7.3* 6.4* 7.8* 8.2* 7.7* 7.6*  HCT 22.9* 20.4* 24.4* 26.1* 24.7* 24.4*  MCV 83.3 84.0  --  83.9 84.9 85.0  PLT 119* 120*  --  152 161 169   Cardiac Enzymes: No results for input(s): "CKTOTAL", "CKMB", "CKMBINDEX", "TROPONINI" in the last 168 hours. BNP: Invalid input(s): "POCBNP" CBG: Recent Labs  Lab 07/29/22 0743 07/29/22 1143 07/29/22 1624 07/29/22 2124 07/30/22 0734  GLUCAP 101* 122* 120* 166* 99   D-Dimer No results for input(s): "DDIMER" in the last 72 hours. Hgb A1c No results for input(s): "HGBA1C" in the last 72 hours. Lipid Profile No results for input(s): "CHOL", "HDL", "LDLCALC", "TRIG", "CHOLHDL", "LDLDIRECT" in the last 72 hours. Thyroid function studies No results for input(s): "TSH", "T4TOTAL", "T3FREE", "THYROIDAB" in the last 72 hours.  Invalid input(s): "FREET3" Anemia work up No results for input(s): "VITAMINB12", "FOLATE", "FERRITIN", "TIBC", "IRON", "RETICCTPCT" in the last 72 hours. Urinalysis    Component Value Date/Time   COLORURINE STRAW (A) 07/21/2022 2225   APPEARANCEUR CLEAR 07/21/2022 2225    LABSPEC 1.012 07/21/2022 2225   PHURINE 6.0 07/21/2022 2225   GLUCOSEU 50 (A) 07/21/2022 2225   HGBUR NEGATIVE 07/21/2022 2225   BILIRUBINUR NEGATIVE 07/21/2022 2225   KETONESUR NEGATIVE 07/21/2022 2225   PROTEINUR NEGATIVE 07/21/2022 2225   UROBILINOGEN 0.2 07/20/2014 1707   NITRITE NEGATIVE 07/21/2022 2225   LEUKOCYTESUR TRACE (A) 07/21/2022 2225   Sepsis Labs Recent Labs  Lab 07/27/22 0447 07/28/22 0456 07/29/22 0453 07/30/22 0459  WBC 3.8* 3.3* 2.7* 3.2*   Microbiology Recent Results (from the past 240 hour(s))  Blood Culture (routine x 2)     Status: None   Collection Time: 07/21/22 10:00 PM   Specimen: BLOOD  Result Value Ref Range Status   Specimen Description   Final    BLOOD SITE NOT SPECIFIED Performed at [facility], [address], [place], [place] [address]    Special Requests   Final  BOTTLES DRAWN AEROBIC AND ANAEROBIC Blood Culture results may not be optimal due to an excessive volume of blood received in culture bottles Performed at [facility], [address], [place], [place] [address]    Culture   Final    NO GROWTH 5 DAYS Performed at [facility], [address], [place], [place] [address]    Report Status 07/26/2022 FINAL  Final  Blood Culture (routine x 2)     Status: None   Collection Time: 07/21/22 10:08 PM   Specimen: BLOOD  Result Value Ref Range Status   Specimen Description   Final    BLOOD SITE NOT SPECIFIED Performed at [facility], [address] [place], [place] [address]    Special Requests   Final    BOTTLES DRAWN AEROBIC AND ANAEROBIC Blood Culture results may not be optimal due to an excessive volume of blood received in culture bottles Performed at [facility], [address], [place], [place] [address]    Culture   Final    NO GROWTH 5 DAYS Performed at [facility], [address] [place], [place] [address]    Report Status  07/26/2022 FINAL  Final  Surgical pcr screen     Status: None   Collection Time: 07/22/22  4:29 PM   Specimen: Nasal Mucosa; Nasal Swab  Result Value Ref Range Status   MRSA, PCR NEGATIVE NEGATIVE Final   Staphylococcus aureus NEGATIVE NEGATIVE Final    Comment: (NOTE) The Xpert SA Assay (FDA approved for NASAL specimens in patients 54 years of age and older), is one component of a comprehensive surveillance program. It is not intended to diagnose infection nor to guide or monitor treatment. Performed at [facility], [address], [place], [place] [address]      Time coordinating discharge:  35 minutes  SIGNED:   Dorcas Carrow, MD  [facility] 07/30/2022, 10:11 AM

## 2022-08-02 DIAGNOSIS — D61818 Other pancytopenia: Secondary | ICD-10-CM | POA: Diagnosis not present

## 2022-08-02 DIAGNOSIS — M1990 Unspecified osteoarthritis, unspecified site: Secondary | ICD-10-CM | POA: Diagnosis not present

## 2022-08-02 DIAGNOSIS — L03311 Cellulitis of abdominal wall: Secondary | ICD-10-CM | POA: Diagnosis not present

## 2022-08-02 DIAGNOSIS — I5032 Chronic diastolic (congestive) heart failure: Secondary | ICD-10-CM | POA: Diagnosis not present

## 2022-08-02 DIAGNOSIS — R1314 Dysphagia, pharyngoesophageal phase: Secondary | ICD-10-CM | POA: Diagnosis not present

## 2022-08-02 DIAGNOSIS — E0500 Thyrotoxicosis with diffuse goiter without thyrotoxic crisis or storm: Secondary | ICD-10-CM | POA: Diagnosis not present

## 2022-08-02 DIAGNOSIS — G4733 Obstructive sleep apnea (adult) (pediatric): Secondary | ICD-10-CM | POA: Diagnosis not present

## 2022-08-02 DIAGNOSIS — G43909 Migraine, unspecified, not intractable, without status migrainosus: Secondary | ICD-10-CM | POA: Diagnosis not present

## 2022-08-02 DIAGNOSIS — E1140 Type 2 diabetes mellitus with diabetic neuropathy, unspecified: Secondary | ICD-10-CM | POA: Diagnosis not present

## 2022-08-02 DIAGNOSIS — F419 Anxiety disorder, unspecified: Secondary | ICD-10-CM | POA: Diagnosis not present

## 2022-08-02 DIAGNOSIS — I495 Sick sinus syndrome: Secondary | ICD-10-CM | POA: Diagnosis not present

## 2022-08-02 DIAGNOSIS — E785 Hyperlipidemia, unspecified: Secondary | ICD-10-CM | POA: Diagnosis not present

## 2022-08-02 DIAGNOSIS — E860 Dehydration: Secondary | ICD-10-CM | POA: Diagnosis not present

## 2022-08-02 DIAGNOSIS — E1122 Type 2 diabetes mellitus with diabetic chronic kidney disease: Secondary | ICD-10-CM | POA: Diagnosis not present

## 2022-08-02 DIAGNOSIS — I080 Rheumatic disorders of both mitral and aortic valves: Secondary | ICD-10-CM | POA: Diagnosis not present

## 2022-08-02 DIAGNOSIS — M797 Fibromyalgia: Secondary | ICD-10-CM | POA: Diagnosis not present

## 2022-08-02 DIAGNOSIS — D509 Iron deficiency anemia, unspecified: Secondary | ICD-10-CM | POA: Diagnosis not present

## 2022-08-02 DIAGNOSIS — M726 Necrotizing fasciitis: Secondary | ICD-10-CM | POA: Diagnosis not present

## 2022-08-02 DIAGNOSIS — I130 Hypertensive heart and chronic kidney disease with heart failure and stage 1 through stage 4 chronic kidney disease, or unspecified chronic kidney disease: Secondary | ICD-10-CM | POA: Diagnosis not present

## 2022-08-02 DIAGNOSIS — K219 Gastro-esophageal reflux disease without esophagitis: Secondary | ICD-10-CM | POA: Diagnosis not present

## 2022-08-02 DIAGNOSIS — F32A Depression, unspecified: Secondary | ICD-10-CM | POA: Diagnosis not present

## 2022-08-02 DIAGNOSIS — N1832 Chronic kidney disease, stage 3b: Secondary | ICD-10-CM | POA: Diagnosis not present

## 2022-08-02 DIAGNOSIS — J4540 Moderate persistent asthma, uncomplicated: Secondary | ICD-10-CM | POA: Diagnosis not present

## 2022-08-02 DIAGNOSIS — D631 Anemia in chronic kidney disease: Secondary | ICD-10-CM | POA: Diagnosis not present

## 2022-08-02 DIAGNOSIS — I429 Cardiomyopathy, unspecified: Secondary | ICD-10-CM | POA: Diagnosis not present

## 2022-08-05 DIAGNOSIS — I5032 Chronic diastolic (congestive) heart failure: Secondary | ICD-10-CM | POA: Diagnosis not present

## 2022-08-05 DIAGNOSIS — N1832 Chronic kidney disease, stage 3b: Secondary | ICD-10-CM | POA: Diagnosis not present

## 2022-08-05 DIAGNOSIS — I130 Hypertensive heart and chronic kidney disease with heart failure and stage 1 through stage 4 chronic kidney disease, or unspecified chronic kidney disease: Secondary | ICD-10-CM | POA: Diagnosis not present

## 2022-08-05 DIAGNOSIS — D631 Anemia in chronic kidney disease: Secondary | ICD-10-CM | POA: Diagnosis not present

## 2022-08-05 DIAGNOSIS — E1122 Type 2 diabetes mellitus with diabetic chronic kidney disease: Secondary | ICD-10-CM | POA: Diagnosis not present

## 2022-08-05 DIAGNOSIS — L03311 Cellulitis of abdominal wall: Secondary | ICD-10-CM | POA: Diagnosis not present

## 2022-08-07 DIAGNOSIS — E1122 Type 2 diabetes mellitus with diabetic chronic kidney disease: Secondary | ICD-10-CM | POA: Diagnosis not present

## 2022-08-07 DIAGNOSIS — N1832 Chronic kidney disease, stage 3b: Secondary | ICD-10-CM | POA: Diagnosis not present

## 2022-08-07 DIAGNOSIS — I5032 Chronic diastolic (congestive) heart failure: Secondary | ICD-10-CM | POA: Diagnosis not present

## 2022-08-07 DIAGNOSIS — D631 Anemia in chronic kidney disease: Secondary | ICD-10-CM | POA: Diagnosis not present

## 2022-08-07 DIAGNOSIS — L03311 Cellulitis of abdominal wall: Secondary | ICD-10-CM | POA: Diagnosis not present

## 2022-08-07 DIAGNOSIS — I130 Hypertensive heart and chronic kidney disease with heart failure and stage 1 through stage 4 chronic kidney disease, or unspecified chronic kidney disease: Secondary | ICD-10-CM | POA: Diagnosis not present

## 2022-08-09 DIAGNOSIS — N1832 Chronic kidney disease, stage 3b: Secondary | ICD-10-CM | POA: Diagnosis not present

## 2022-08-09 DIAGNOSIS — I5032 Chronic diastolic (congestive) heart failure: Secondary | ICD-10-CM | POA: Diagnosis not present

## 2022-08-09 DIAGNOSIS — D631 Anemia in chronic kidney disease: Secondary | ICD-10-CM | POA: Diagnosis not present

## 2022-08-09 DIAGNOSIS — E1122 Type 2 diabetes mellitus with diabetic chronic kidney disease: Secondary | ICD-10-CM | POA: Diagnosis not present

## 2022-08-09 DIAGNOSIS — L03311 Cellulitis of abdominal wall: Secondary | ICD-10-CM | POA: Diagnosis not present

## 2022-08-09 DIAGNOSIS — I130 Hypertensive heart and chronic kidney disease with heart failure and stage 1 through stage 4 chronic kidney disease, or unspecified chronic kidney disease: Secondary | ICD-10-CM | POA: Diagnosis not present

## 2022-08-10 ENCOUNTER — Other Ambulatory Visit: Payer: Self-pay | Admitting: Surgery

## 2022-08-11 DIAGNOSIS — N1832 Chronic kidney disease, stage 3b: Secondary | ICD-10-CM | POA: Diagnosis not present

## 2022-08-11 DIAGNOSIS — I130 Hypertensive heart and chronic kidney disease with heart failure and stage 1 through stage 4 chronic kidney disease, or unspecified chronic kidney disease: Secondary | ICD-10-CM | POA: Diagnosis not present

## 2022-08-11 DIAGNOSIS — I5032 Chronic diastolic (congestive) heart failure: Secondary | ICD-10-CM | POA: Diagnosis not present

## 2022-08-11 DIAGNOSIS — D631 Anemia in chronic kidney disease: Secondary | ICD-10-CM | POA: Diagnosis not present

## 2022-08-11 DIAGNOSIS — E1122 Type 2 diabetes mellitus with diabetic chronic kidney disease: Secondary | ICD-10-CM | POA: Diagnosis not present

## 2022-08-11 DIAGNOSIS — L03311 Cellulitis of abdominal wall: Secondary | ICD-10-CM | POA: Diagnosis not present

## 2022-08-12 DIAGNOSIS — I130 Hypertensive heart and chronic kidney disease with heart failure and stage 1 through stage 4 chronic kidney disease, or unspecified chronic kidney disease: Secondary | ICD-10-CM | POA: Diagnosis not present

## 2022-08-12 DIAGNOSIS — I5032 Chronic diastolic (congestive) heart failure: Secondary | ICD-10-CM | POA: Diagnosis not present

## 2022-08-12 DIAGNOSIS — L03311 Cellulitis of abdominal wall: Secondary | ICD-10-CM | POA: Diagnosis not present

## 2022-08-12 DIAGNOSIS — D631 Anemia in chronic kidney disease: Secondary | ICD-10-CM | POA: Diagnosis not present

## 2022-08-12 DIAGNOSIS — N1832 Chronic kidney disease, stage 3b: Secondary | ICD-10-CM | POA: Diagnosis not present

## 2022-08-12 DIAGNOSIS — E1122 Type 2 diabetes mellitus with diabetic chronic kidney disease: Secondary | ICD-10-CM | POA: Diagnosis not present

## 2022-08-13 DIAGNOSIS — I130 Hypertensive heart and chronic kidney disease with heart failure and stage 1 through stage 4 chronic kidney disease, or unspecified chronic kidney disease: Secondary | ICD-10-CM | POA: Diagnosis not present

## 2022-08-13 DIAGNOSIS — N1832 Chronic kidney disease, stage 3b: Secondary | ICD-10-CM | POA: Diagnosis not present

## 2022-08-13 DIAGNOSIS — I5032 Chronic diastolic (congestive) heart failure: Secondary | ICD-10-CM | POA: Diagnosis not present

## 2022-08-13 DIAGNOSIS — D631 Anemia in chronic kidney disease: Secondary | ICD-10-CM | POA: Diagnosis not present

## 2022-08-13 DIAGNOSIS — L03311 Cellulitis of abdominal wall: Secondary | ICD-10-CM | POA: Diagnosis not present

## 2022-08-13 DIAGNOSIS — E1122 Type 2 diabetes mellitus with diabetic chronic kidney disease: Secondary | ICD-10-CM | POA: Diagnosis not present

## 2022-08-16 DIAGNOSIS — L03311 Cellulitis of abdominal wall: Secondary | ICD-10-CM | POA: Diagnosis not present

## 2022-08-16 DIAGNOSIS — N1832 Chronic kidney disease, stage 3b: Secondary | ICD-10-CM | POA: Diagnosis not present

## 2022-08-16 DIAGNOSIS — I130 Hypertensive heart and chronic kidney disease with heart failure and stage 1 through stage 4 chronic kidney disease, or unspecified chronic kidney disease: Secondary | ICD-10-CM | POA: Diagnosis not present

## 2022-08-16 DIAGNOSIS — E1122 Type 2 diabetes mellitus with diabetic chronic kidney disease: Secondary | ICD-10-CM | POA: Diagnosis not present

## 2022-08-16 DIAGNOSIS — D631 Anemia in chronic kidney disease: Secondary | ICD-10-CM | POA: Diagnosis not present

## 2022-08-16 DIAGNOSIS — I5032 Chronic diastolic (congestive) heart failure: Secondary | ICD-10-CM | POA: Diagnosis not present

## 2022-08-17 DIAGNOSIS — L03311 Cellulitis of abdominal wall: Secondary | ICD-10-CM | POA: Diagnosis not present

## 2022-08-17 DIAGNOSIS — D631 Anemia in chronic kidney disease: Secondary | ICD-10-CM | POA: Diagnosis not present

## 2022-08-17 DIAGNOSIS — E1122 Type 2 diabetes mellitus with diabetic chronic kidney disease: Secondary | ICD-10-CM | POA: Diagnosis not present

## 2022-08-17 DIAGNOSIS — N1832 Chronic kidney disease, stage 3b: Secondary | ICD-10-CM | POA: Diagnosis not present

## 2022-08-17 DIAGNOSIS — I5032 Chronic diastolic (congestive) heart failure: Secondary | ICD-10-CM | POA: Diagnosis not present

## 2022-08-17 DIAGNOSIS — I130 Hypertensive heart and chronic kidney disease with heart failure and stage 1 through stage 4 chronic kidney disease, or unspecified chronic kidney disease: Secondary | ICD-10-CM | POA: Diagnosis not present

## 2022-08-18 DIAGNOSIS — N1832 Chronic kidney disease, stage 3b: Secondary | ICD-10-CM | POA: Diagnosis not present

## 2022-08-18 DIAGNOSIS — E1122 Type 2 diabetes mellitus with diabetic chronic kidney disease: Secondary | ICD-10-CM | POA: Diagnosis not present

## 2022-08-18 DIAGNOSIS — L03311 Cellulitis of abdominal wall: Secondary | ICD-10-CM | POA: Diagnosis not present

## 2022-08-18 DIAGNOSIS — I5032 Chronic diastolic (congestive) heart failure: Secondary | ICD-10-CM | POA: Diagnosis not present

## 2022-08-18 DIAGNOSIS — I130 Hypertensive heart and chronic kidney disease with heart failure and stage 1 through stage 4 chronic kidney disease, or unspecified chronic kidney disease: Secondary | ICD-10-CM | POA: Diagnosis not present

## 2022-08-18 DIAGNOSIS — D631 Anemia in chronic kidney disease: Secondary | ICD-10-CM | POA: Diagnosis not present

## 2022-08-19 DIAGNOSIS — N1832 Chronic kidney disease, stage 3b: Secondary | ICD-10-CM | POA: Diagnosis not present

## 2022-08-19 DIAGNOSIS — D631 Anemia in chronic kidney disease: Secondary | ICD-10-CM | POA: Diagnosis not present

## 2022-08-19 DIAGNOSIS — L03311 Cellulitis of abdominal wall: Secondary | ICD-10-CM | POA: Diagnosis not present

## 2022-08-19 DIAGNOSIS — I130 Hypertensive heart and chronic kidney disease with heart failure and stage 1 through stage 4 chronic kidney disease, or unspecified chronic kidney disease: Secondary | ICD-10-CM | POA: Diagnosis not present

## 2022-08-19 DIAGNOSIS — E1122 Type 2 diabetes mellitus with diabetic chronic kidney disease: Secondary | ICD-10-CM | POA: Diagnosis not present

## 2022-08-19 DIAGNOSIS — I5032 Chronic diastolic (congestive) heart failure: Secondary | ICD-10-CM | POA: Diagnosis not present

## 2022-08-20 DIAGNOSIS — N1832 Chronic kidney disease, stage 3b: Secondary | ICD-10-CM | POA: Diagnosis not present

## 2022-08-20 DIAGNOSIS — I130 Hypertensive heart and chronic kidney disease with heart failure and stage 1 through stage 4 chronic kidney disease, or unspecified chronic kidney disease: Secondary | ICD-10-CM | POA: Diagnosis not present

## 2022-08-20 DIAGNOSIS — L03311 Cellulitis of abdominal wall: Secondary | ICD-10-CM | POA: Diagnosis not present

## 2022-08-20 DIAGNOSIS — D631 Anemia in chronic kidney disease: Secondary | ICD-10-CM | POA: Diagnosis not present

## 2022-08-20 DIAGNOSIS — E1122 Type 2 diabetes mellitus with diabetic chronic kidney disease: Secondary | ICD-10-CM | POA: Diagnosis not present

## 2022-08-20 DIAGNOSIS — I5032 Chronic diastolic (congestive) heart failure: Secondary | ICD-10-CM | POA: Diagnosis not present

## 2022-08-23 DIAGNOSIS — D631 Anemia in chronic kidney disease: Secondary | ICD-10-CM | POA: Diagnosis not present

## 2022-08-23 DIAGNOSIS — N1832 Chronic kidney disease, stage 3b: Secondary | ICD-10-CM | POA: Diagnosis not present

## 2022-08-23 DIAGNOSIS — E1122 Type 2 diabetes mellitus with diabetic chronic kidney disease: Secondary | ICD-10-CM | POA: Diagnosis not present

## 2022-08-23 DIAGNOSIS — I5032 Chronic diastolic (congestive) heart failure: Secondary | ICD-10-CM | POA: Diagnosis not present

## 2022-08-23 DIAGNOSIS — L03311 Cellulitis of abdominal wall: Secondary | ICD-10-CM | POA: Diagnosis not present

## 2022-08-23 DIAGNOSIS — I130 Hypertensive heart and chronic kidney disease with heart failure and stage 1 through stage 4 chronic kidney disease, or unspecified chronic kidney disease: Secondary | ICD-10-CM | POA: Diagnosis not present

## 2022-08-25 DIAGNOSIS — I130 Hypertensive heart and chronic kidney disease with heart failure and stage 1 through stage 4 chronic kidney disease, or unspecified chronic kidney disease: Secondary | ICD-10-CM | POA: Diagnosis not present

## 2022-08-25 DIAGNOSIS — D631 Anemia in chronic kidney disease: Secondary | ICD-10-CM | POA: Diagnosis not present

## 2022-08-25 DIAGNOSIS — I5032 Chronic diastolic (congestive) heart failure: Secondary | ICD-10-CM | POA: Diagnosis not present

## 2022-08-25 DIAGNOSIS — E1122 Type 2 diabetes mellitus with diabetic chronic kidney disease: Secondary | ICD-10-CM | POA: Diagnosis not present

## 2022-08-25 DIAGNOSIS — L03311 Cellulitis of abdominal wall: Secondary | ICD-10-CM | POA: Diagnosis not present

## 2022-08-25 DIAGNOSIS — N1832 Chronic kidney disease, stage 3b: Secondary | ICD-10-CM | POA: Diagnosis not present

## 2022-08-26 DIAGNOSIS — L7682 Other postprocedural complications of skin and subcutaneous tissue: Secondary | ICD-10-CM | POA: Diagnosis not present

## 2022-08-26 DIAGNOSIS — M7989 Other specified soft tissue disorders: Secondary | ICD-10-CM | POA: Diagnosis not present

## 2022-08-27 DIAGNOSIS — E1129 Type 2 diabetes mellitus with other diabetic kidney complication: Secondary | ICD-10-CM | POA: Diagnosis not present

## 2022-08-27 DIAGNOSIS — E1122 Type 2 diabetes mellitus with diabetic chronic kidney disease: Secondary | ICD-10-CM | POA: Diagnosis not present

## 2022-08-27 DIAGNOSIS — E785 Hyperlipidemia, unspecified: Secondary | ICD-10-CM | POA: Diagnosis not present

## 2022-08-27 DIAGNOSIS — E0590 Thyrotoxicosis, unspecified without thyrotoxic crisis or storm: Secondary | ICD-10-CM | POA: Diagnosis not present

## 2022-08-27 DIAGNOSIS — F339 Major depressive disorder, recurrent, unspecified: Secondary | ICD-10-CM | POA: Diagnosis not present

## 2022-08-27 DIAGNOSIS — D631 Anemia in chronic kidney disease: Secondary | ICD-10-CM | POA: Diagnosis not present

## 2022-08-27 DIAGNOSIS — Z794 Long term (current) use of insulin: Secondary | ICD-10-CM | POA: Diagnosis not present

## 2022-08-27 DIAGNOSIS — G4733 Obstructive sleep apnea (adult) (pediatric): Secondary | ICD-10-CM | POA: Diagnosis not present

## 2022-08-27 DIAGNOSIS — K8689 Other specified diseases of pancreas: Secondary | ICD-10-CM | POA: Diagnosis not present

## 2022-08-27 DIAGNOSIS — N1832 Chronic kidney disease, stage 3b: Secondary | ICD-10-CM | POA: Diagnosis not present

## 2022-08-27 DIAGNOSIS — I130 Hypertensive heart and chronic kidney disease with heart failure and stage 1 through stage 4 chronic kidney disease, or unspecified chronic kidney disease: Secondary | ICD-10-CM | POA: Diagnosis not present

## 2022-08-27 DIAGNOSIS — N1831 Chronic kidney disease, stage 3a: Secondary | ICD-10-CM | POA: Diagnosis not present

## 2022-08-27 DIAGNOSIS — L03311 Cellulitis of abdominal wall: Secondary | ICD-10-CM | POA: Diagnosis not present

## 2022-08-27 DIAGNOSIS — I5032 Chronic diastolic (congestive) heart failure: Secondary | ICD-10-CM | POA: Diagnosis not present

## 2022-08-27 DIAGNOSIS — E039 Hypothyroidism, unspecified: Secondary | ICD-10-CM | POA: Diagnosis not present

## 2022-08-30 DIAGNOSIS — N1832 Chronic kidney disease, stage 3b: Secondary | ICD-10-CM | POA: Diagnosis not present

## 2022-08-30 DIAGNOSIS — E1122 Type 2 diabetes mellitus with diabetic chronic kidney disease: Secondary | ICD-10-CM | POA: Diagnosis not present

## 2022-08-30 DIAGNOSIS — I5032 Chronic diastolic (congestive) heart failure: Secondary | ICD-10-CM | POA: Diagnosis not present

## 2022-08-30 DIAGNOSIS — L03311 Cellulitis of abdominal wall: Secondary | ICD-10-CM | POA: Diagnosis not present

## 2022-08-30 DIAGNOSIS — D631 Anemia in chronic kidney disease: Secondary | ICD-10-CM | POA: Diagnosis not present

## 2022-08-30 DIAGNOSIS — R7989 Other specified abnormal findings of blood chemistry: Secondary | ICD-10-CM | POA: Diagnosis not present

## 2022-08-30 DIAGNOSIS — I130 Hypertensive heart and chronic kidney disease with heart failure and stage 1 through stage 4 chronic kidney disease, or unspecified chronic kidney disease: Secondary | ICD-10-CM | POA: Diagnosis not present

## 2022-08-30 DIAGNOSIS — I10 Essential (primary) hypertension: Secondary | ICD-10-CM | POA: Diagnosis not present

## 2022-08-30 DIAGNOSIS — E785 Hyperlipidemia, unspecified: Secondary | ICD-10-CM | POA: Diagnosis not present

## 2022-08-30 DIAGNOSIS — D649 Anemia, unspecified: Secondary | ICD-10-CM | POA: Diagnosis not present

## 2022-09-01 DIAGNOSIS — G4733 Obstructive sleep apnea (adult) (pediatric): Secondary | ICD-10-CM | POA: Diagnosis not present

## 2022-09-01 DIAGNOSIS — D509 Iron deficiency anemia, unspecified: Secondary | ICD-10-CM | POA: Diagnosis not present

## 2022-09-01 DIAGNOSIS — E0500 Thyrotoxicosis with diffuse goiter without thyrotoxic crisis or storm: Secondary | ICD-10-CM | POA: Diagnosis not present

## 2022-09-01 DIAGNOSIS — I495 Sick sinus syndrome: Secondary | ICD-10-CM | POA: Diagnosis not present

## 2022-09-01 DIAGNOSIS — R1314 Dysphagia, pharyngoesophageal phase: Secondary | ICD-10-CM | POA: Diagnosis not present

## 2022-09-01 DIAGNOSIS — E1140 Type 2 diabetes mellitus with diabetic neuropathy, unspecified: Secondary | ICD-10-CM | POA: Diagnosis not present

## 2022-09-01 DIAGNOSIS — I130 Hypertensive heart and chronic kidney disease with heart failure and stage 1 through stage 4 chronic kidney disease, or unspecified chronic kidney disease: Secondary | ICD-10-CM | POA: Diagnosis not present

## 2022-09-01 DIAGNOSIS — F32A Depression, unspecified: Secondary | ICD-10-CM | POA: Diagnosis not present

## 2022-09-01 DIAGNOSIS — I429 Cardiomyopathy, unspecified: Secondary | ICD-10-CM | POA: Diagnosis not present

## 2022-09-01 DIAGNOSIS — E785 Hyperlipidemia, unspecified: Secondary | ICD-10-CM | POA: Diagnosis not present

## 2022-09-01 DIAGNOSIS — E1122 Type 2 diabetes mellitus with diabetic chronic kidney disease: Secondary | ICD-10-CM | POA: Diagnosis not present

## 2022-09-01 DIAGNOSIS — E860 Dehydration: Secondary | ICD-10-CM | POA: Diagnosis not present

## 2022-09-01 DIAGNOSIS — I5032 Chronic diastolic (congestive) heart failure: Secondary | ICD-10-CM | POA: Diagnosis not present

## 2022-09-01 DIAGNOSIS — M1990 Unspecified osteoarthritis, unspecified site: Secondary | ICD-10-CM | POA: Diagnosis not present

## 2022-09-01 DIAGNOSIS — F419 Anxiety disorder, unspecified: Secondary | ICD-10-CM | POA: Diagnosis not present

## 2022-09-01 DIAGNOSIS — G43909 Migraine, unspecified, not intractable, without status migrainosus: Secondary | ICD-10-CM | POA: Diagnosis not present

## 2022-09-01 DIAGNOSIS — M797 Fibromyalgia: Secondary | ICD-10-CM | POA: Diagnosis not present

## 2022-09-01 DIAGNOSIS — N1832 Chronic kidney disease, stage 3b: Secondary | ICD-10-CM | POA: Diagnosis not present

## 2022-09-01 DIAGNOSIS — K219 Gastro-esophageal reflux disease without esophagitis: Secondary | ICD-10-CM | POA: Diagnosis not present

## 2022-09-01 DIAGNOSIS — D61818 Other pancytopenia: Secondary | ICD-10-CM | POA: Diagnosis not present

## 2022-09-01 DIAGNOSIS — J4540 Moderate persistent asthma, uncomplicated: Secondary | ICD-10-CM | POA: Diagnosis not present

## 2022-09-01 DIAGNOSIS — I080 Rheumatic disorders of both mitral and aortic valves: Secondary | ICD-10-CM | POA: Diagnosis not present

## 2022-09-01 DIAGNOSIS — D631 Anemia in chronic kidney disease: Secondary | ICD-10-CM | POA: Diagnosis not present

## 2022-09-01 DIAGNOSIS — L03311 Cellulitis of abdominal wall: Secondary | ICD-10-CM | POA: Diagnosis not present

## 2022-09-01 DIAGNOSIS — M726 Necrotizing fasciitis: Secondary | ICD-10-CM | POA: Diagnosis not present

## 2022-09-02 DIAGNOSIS — N1832 Chronic kidney disease, stage 3b: Secondary | ICD-10-CM | POA: Diagnosis not present

## 2022-09-02 DIAGNOSIS — D631 Anemia in chronic kidney disease: Secondary | ICD-10-CM | POA: Diagnosis not present

## 2022-09-02 DIAGNOSIS — I5032 Chronic diastolic (congestive) heart failure: Secondary | ICD-10-CM | POA: Diagnosis not present

## 2022-09-02 DIAGNOSIS — I130 Hypertensive heart and chronic kidney disease with heart failure and stage 1 through stage 4 chronic kidney disease, or unspecified chronic kidney disease: Secondary | ICD-10-CM | POA: Diagnosis not present

## 2022-09-02 DIAGNOSIS — E1122 Type 2 diabetes mellitus with diabetic chronic kidney disease: Secondary | ICD-10-CM | POA: Diagnosis not present

## 2022-09-02 DIAGNOSIS — L03311 Cellulitis of abdominal wall: Secondary | ICD-10-CM | POA: Diagnosis not present

## 2022-09-04 DIAGNOSIS — I130 Hypertensive heart and chronic kidney disease with heart failure and stage 1 through stage 4 chronic kidney disease, or unspecified chronic kidney disease: Secondary | ICD-10-CM | POA: Diagnosis not present

## 2022-09-04 DIAGNOSIS — E1122 Type 2 diabetes mellitus with diabetic chronic kidney disease: Secondary | ICD-10-CM | POA: Diagnosis not present

## 2022-09-04 DIAGNOSIS — L03311 Cellulitis of abdominal wall: Secondary | ICD-10-CM | POA: Diagnosis not present

## 2022-09-04 DIAGNOSIS — I5032 Chronic diastolic (congestive) heart failure: Secondary | ICD-10-CM | POA: Diagnosis not present

## 2022-09-04 DIAGNOSIS — D631 Anemia in chronic kidney disease: Secondary | ICD-10-CM | POA: Diagnosis not present

## 2022-09-04 DIAGNOSIS — N1832 Chronic kidney disease, stage 3b: Secondary | ICD-10-CM | POA: Diagnosis not present

## 2022-09-06 DIAGNOSIS — D649 Anemia, unspecified: Secondary | ICD-10-CM | POA: Diagnosis not present

## 2022-09-06 DIAGNOSIS — E785 Hyperlipidemia, unspecified: Secondary | ICD-10-CM | POA: Diagnosis not present

## 2022-09-06 DIAGNOSIS — I10 Essential (primary) hypertension: Secondary | ICD-10-CM | POA: Diagnosis not present

## 2022-09-06 DIAGNOSIS — R7989 Other specified abnormal findings of blood chemistry: Secondary | ICD-10-CM | POA: Diagnosis not present

## 2022-09-07 DIAGNOSIS — L03311 Cellulitis of abdominal wall: Secondary | ICD-10-CM | POA: Diagnosis not present

## 2022-09-07 DIAGNOSIS — I5032 Chronic diastolic (congestive) heart failure: Secondary | ICD-10-CM | POA: Diagnosis not present

## 2022-09-07 DIAGNOSIS — E1122 Type 2 diabetes mellitus with diabetic chronic kidney disease: Secondary | ICD-10-CM | POA: Diagnosis not present

## 2022-09-07 DIAGNOSIS — N1832 Chronic kidney disease, stage 3b: Secondary | ICD-10-CM | POA: Diagnosis not present

## 2022-09-07 DIAGNOSIS — D631 Anemia in chronic kidney disease: Secondary | ICD-10-CM | POA: Diagnosis not present

## 2022-09-07 DIAGNOSIS — I130 Hypertensive heart and chronic kidney disease with heart failure and stage 1 through stage 4 chronic kidney disease, or unspecified chronic kidney disease: Secondary | ICD-10-CM | POA: Diagnosis not present

## 2022-09-09 ENCOUNTER — Encounter: Payer: Self-pay | Admitting: Gastroenterology

## 2022-09-09 DIAGNOSIS — L7682 Other postprocedural complications of skin and subcutaneous tissue: Secondary | ICD-10-CM | POA: Diagnosis not present

## 2022-09-09 DIAGNOSIS — M7989 Other specified soft tissue disorders: Secondary | ICD-10-CM | POA: Diagnosis not present

## 2022-09-17 ENCOUNTER — Encounter: Payer: Self-pay | Admitting: General Surgery

## 2022-09-30 DIAGNOSIS — E039 Hypothyroidism, unspecified: Secondary | ICD-10-CM | POA: Diagnosis not present

## 2022-09-30 DIAGNOSIS — I129 Hypertensive chronic kidney disease with stage 1 through stage 4 chronic kidney disease, or unspecified chronic kidney disease: Secondary | ICD-10-CM | POA: Diagnosis not present

## 2022-09-30 DIAGNOSIS — E1129 Type 2 diabetes mellitus with other diabetic kidney complication: Secondary | ICD-10-CM | POA: Diagnosis not present

## 2022-09-30 DIAGNOSIS — E785 Hyperlipidemia, unspecified: Secondary | ICD-10-CM | POA: Diagnosis not present

## 2022-10-01 DIAGNOSIS — H9202 Otalgia, left ear: Secondary | ICD-10-CM | POA: Diagnosis not present

## 2022-10-01 DIAGNOSIS — L089 Local infection of the skin and subcutaneous tissue, unspecified: Secondary | ICD-10-CM | POA: Diagnosis not present

## 2022-10-12 DIAGNOSIS — I10 Essential (primary) hypertension: Secondary | ICD-10-CM | POA: Diagnosis not present

## 2022-10-12 DIAGNOSIS — H6012 Cellulitis of left external ear: Secondary | ICD-10-CM | POA: Diagnosis not present

## 2022-10-18 ENCOUNTER — Encounter: Payer: Self-pay | Admitting: *Deleted
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
## 2022-10-19 ENCOUNTER — Encounter: Payer: Medicare Other | Admitting: Adult Health

## 2022-10-25 DIAGNOSIS — D631 Anemia in chronic kidney disease: Secondary | ICD-10-CM | POA: Diagnosis not present

## 2022-10-25 DIAGNOSIS — N2581 Secondary hyperparathyroidism of renal origin: Secondary | ICD-10-CM | POA: Diagnosis not present

## 2022-10-25 DIAGNOSIS — I129 Hypertensive chronic kidney disease with stage 1 through stage 4 chronic kidney disease, or unspecified chronic kidney disease: Secondary | ICD-10-CM | POA: Diagnosis not present

## 2022-10-25 DIAGNOSIS — N189 Chronic kidney disease, unspecified: Secondary | ICD-10-CM | POA: Diagnosis not present

## 2022-10-25 DIAGNOSIS — N1831 Chronic kidney disease, stage 3a: Secondary | ICD-10-CM | POA: Diagnosis not present

## 2022-10-27 DIAGNOSIS — L089 Local infection of the skin and subcutaneous tissue, unspecified: Secondary | ICD-10-CM | POA: Diagnosis not present

## 2022-10-27 DIAGNOSIS — E785 Hyperlipidemia, unspecified: Secondary | ICD-10-CM | POA: Diagnosis not present

## 2022-10-27 DIAGNOSIS — H9202 Otalgia, left ear: Secondary | ICD-10-CM | POA: Diagnosis not present

## 2022-10-27 DIAGNOSIS — Z794 Long term (current) use of insulin: Secondary | ICD-10-CM | POA: Diagnosis not present

## 2022-10-27 DIAGNOSIS — I5032 Chronic diastolic (congestive) heart failure: Secondary | ICD-10-CM | POA: Diagnosis not present

## 2022-10-27 DIAGNOSIS — N1831 Chronic kidney disease, stage 3a: Secondary | ICD-10-CM | POA: Diagnosis not present

## 2022-10-27 DIAGNOSIS — D61818 Other pancytopenia: Secondary | ICD-10-CM | POA: Diagnosis not present

## 2022-10-27 DIAGNOSIS — E1129 Type 2 diabetes mellitus with other diabetic kidney complication: Secondary | ICD-10-CM | POA: Diagnosis not present

## 2022-10-27 DIAGNOSIS — E1169 Type 2 diabetes mellitus with other specified complication: Secondary | ICD-10-CM | POA: Diagnosis not present

## 2022-10-27 DIAGNOSIS — K8689 Other specified diseases of pancreas: Secondary | ICD-10-CM | POA: Diagnosis not present

## 2022-10-27 DIAGNOSIS — I129 Hypertensive chronic kidney disease with stage 1 through stage 4 chronic kidney disease, or unspecified chronic kidney disease: Secondary | ICD-10-CM | POA: Diagnosis not present

## 2022-10-27 NOTE — Progress Notes (Signed)
This encounter was created in error - please disregard.

## 2022-11-11 ENCOUNTER — Encounter: Payer: Self-pay | Admitting: *Deleted
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
## 2022-11-11 NOTE — Telephone Encounter (Signed)
Spoke to pt  . Informed pt to sign in mychart on her husbands phone for VV with Meagn,NP 11/15/2022  Pt expressed understanding

## 2022-11-15 ENCOUNTER — Encounter: Payer: Self-pay | Admitting: Physician Assistant

## 2022-11-15 ENCOUNTER — Telehealth (INDEPENDENT_AMBULATORY_CARE_PROVIDER_SITE_OTHER): Payer: Medicare Other | Admitting: Adult Health

## 2022-11-15 DIAGNOSIS — G4733 Obstructive sleep apnea (adult) (pediatric): Secondary | ICD-10-CM | POA: Diagnosis not present

## 2022-11-16 ENCOUNTER — Encounter: Payer: Self-pay | Admitting: Physician Assistant

## 2022-11-16 DIAGNOSIS — C44229 Squamous cell carcinoma of skin of left ear and external auricular canal: Secondary | ICD-10-CM | POA: Diagnosis not present

## 2022-11-16 DIAGNOSIS — D485 Neoplasm of uncertain behavior of skin: Secondary | ICD-10-CM | POA: Diagnosis not present

## 2022-11-17 ENCOUNTER — Other Ambulatory Visit: Payer: Medicare Other

## 2022-11-17 ENCOUNTER — Inpatient Hospital Stay: Payer: Medicare Other | Attending: Hematology | Admitting: Hematology and Oncology

## 2022-11-17 ENCOUNTER — Ambulatory Visit: Payer: Medicare Other | Admitting: Hematology

## 2022-11-17 ENCOUNTER — Inpatient Hospital Stay: Payer: Medicare Other

## 2022-11-17 ENCOUNTER — Other Ambulatory Visit: Payer: Self-pay | Admitting: Hematology and Oncology

## 2022-11-17 VITALS — BP 142/55 | HR 69 | Temp 98.5°F | Resp 18 | Wt 202.1 lb

## 2022-11-17 DIAGNOSIS — D61818 Other pancytopenia: Secondary | ICD-10-CM | POA: Diagnosis not present

## 2022-11-17 DIAGNOSIS — E1122 Type 2 diabetes mellitus with diabetic chronic kidney disease: Secondary | ICD-10-CM | POA: Diagnosis not present

## 2022-11-17 DIAGNOSIS — D696 Thrombocytopenia, unspecified: Secondary | ICD-10-CM | POA: Insufficient documentation

## 2022-11-17 DIAGNOSIS — G4733 Obstructive sleep apnea (adult) (pediatric): Secondary | ICD-10-CM | POA: Insufficient documentation

## 2022-11-17 DIAGNOSIS — D509 Iron deficiency anemia, unspecified: Secondary | ICD-10-CM | POA: Insufficient documentation

## 2022-11-17 DIAGNOSIS — F32A Depression, unspecified: Secondary | ICD-10-CM | POA: Insufficient documentation

## 2022-11-17 DIAGNOSIS — D500 Iron deficiency anemia secondary to blood loss (chronic): Secondary | ICD-10-CM

## 2022-11-17 DIAGNOSIS — R161 Splenomegaly, not elsewhere classified: Secondary | ICD-10-CM | POA: Diagnosis not present

## 2022-11-17 DIAGNOSIS — E039 Hypothyroidism, unspecified: Secondary | ICD-10-CM | POA: Diagnosis not present

## 2022-11-17 DIAGNOSIS — Z79899 Other long term (current) drug therapy: Secondary | ICD-10-CM | POA: Diagnosis not present

## 2022-11-17 DIAGNOSIS — Z794 Long term (current) use of insulin: Secondary | ICD-10-CM | POA: Diagnosis not present

## 2022-11-17 DIAGNOSIS — E7800 Pure hypercholesterolemia, unspecified: Secondary | ICD-10-CM | POA: Diagnosis not present

## 2022-11-17 DIAGNOSIS — E669 Obesity, unspecified: Secondary | ICD-10-CM | POA: Insufficient documentation

## 2022-11-17 DIAGNOSIS — I129 Hypertensive chronic kidney disease with stage 1 through stage 4 chronic kidney disease, or unspecified chronic kidney disease: Secondary | ICD-10-CM | POA: Insufficient documentation

## 2022-11-17 DIAGNOSIS — D72819 Decreased white blood cell count, unspecified: Secondary | ICD-10-CM | POA: Diagnosis not present

## 2022-11-17 DIAGNOSIS — G2581 Restless legs syndrome: Secondary | ICD-10-CM | POA: Diagnosis not present

## 2022-11-17 DIAGNOSIS — M1990 Unspecified osteoarthritis, unspecified site: Secondary | ICD-10-CM | POA: Diagnosis not present

## 2022-11-17 LAB — CMP (CANCER CENTER ONLY)
ALT: 7 U/L (ref 0–44)
AST: 12 U/L — ABNORMAL LOW (ref 15–41)
Albumin: 3.9 g/dL (ref 3.5–5.0)
Alkaline Phosphatase: 58 U/L (ref 38–126)
Anion gap: 7 (ref 5–15)
BUN: 48 mg/dL — ABNORMAL HIGH (ref 8–23)
CO2: 28 mmol/L (ref 22–32)
Calcium: 9.4 mg/dL (ref 8.9–10.3)
Chloride: 106 mmol/L (ref 98–111)
Creatinine: 1.49 mg/dL — ABNORMAL HIGH (ref 0.44–1.00)
GFR, Estimated: 38 mL/min — ABNORMAL LOW (ref >60)
Glucose, Bld: 103 mg/dL — ABNORMAL HIGH (ref 70–99)
Potassium: 4.3 mmol/L (ref 3.5–5.1)
Sodium: 141 mmol/L (ref 135–145)
Total Bilirubin: 0.3 mg/dL (ref 0.3–1.2)
Total Protein: 7.2 g/dL (ref 6.5–8.1)

## 2022-11-17 LAB — CBC WITH DIFFERENTIAL (CANCER CENTER ONLY)
Abs Immature Granulocytes: 0.01 10*3/uL (ref 0.00–0.07)
Basophils Absolute: 0 10*3/uL (ref 0.0–0.1)
Basophils Relative: 0 %
Eosinophils Absolute: 0 10*3/uL (ref 0.0–0.5)
Eosinophils Relative: 0 %
HCT: 26.7 % — ABNORMAL LOW (ref 36.0–46.0)
Hemoglobin: 8.6 g/dL — ABNORMAL LOW (ref 12.0–15.0)
Immature Granulocytes: 0 %
Lymphocytes Relative: 20 %
Lymphs Abs: 0.5 10*3/uL — ABNORMAL LOW (ref 0.7–4.0)
MCH: 26.8 pg (ref 26.0–34.0)
MCHC: 32.2 g/dL (ref 30.0–36.0)
MCV: 83.2 fL (ref 80.0–100.0)
Monocytes Absolute: 0.2 10*3/uL (ref 0.1–1.0)
Monocytes Relative: 7 %
Neutro Abs: 1.9 10*3/uL (ref 1.7–7.7)
Neutrophils Relative %: 73 %
Platelet Count: 124 10*3/uL — ABNORMAL LOW (ref 150–400)
RBC: 3.21 MIL/uL — ABNORMAL LOW (ref 3.87–5.11)
RDW: 13.2 % (ref 11.5–15.5)
WBC Count: 2.6 10*3/uL — ABNORMAL LOW (ref 4.0–10.5)
nRBC: 0 % (ref 0.0–0.2)

## 2022-11-17 LAB — RETIC PANEL
Immature Retic Fract: 20.5 % — ABNORMAL HIGH (ref 2.3–15.9)
RBC.: 3.12 MIL/uL — ABNORMAL LOW (ref 3.87–5.11)
Retic Count, Absolute: 44.6 10*3/uL (ref 19.0–186.0)
Retic Ct Pct: 1.4 % (ref 0.4–3.1)
Reticulocyte Hemoglobin: 29.6 pg (ref >27.9)

## 2022-11-17 LAB — FERRITIN: Ferritin: 45 ng/mL (ref 11–307)
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
## 2022-11-18 DIAGNOSIS — Z23 Encounter for immunization: Secondary | ICD-10-CM | POA: Diagnosis not present

## 2022-11-18 LAB — IRON AND IRON BINDING CAPACITY (CC-WL,HP ONLY)
Iron: 25 ug/dL — ABNORMAL LOW (ref 28–170)
Saturation Ratios: 7 % — ABNORMAL LOW (ref 10.4–31.8)
TIBC: 357 ug/dL (ref 250–450)
UIBC: 332 ug/dL

## 2022-11-19 ENCOUNTER — Encounter: Payer: Self-pay | Admitting: Physician Assistant

## 2022-11-19 ENCOUNTER — Telehealth: Payer: Self-pay | Admitting: *Deleted

## 2022-11-19 NOTE — Telephone Encounter (Signed)
TCT patient regarding recent lab results.  Spoke to her. Advised that her labs do show persistent iron deficiency. Her iron sat is 7% and her ferritin is below optimal. Given these findings it would be appropriate to pursue another IV iron around if she is interested.  Pt states she would like to get the IV iron as she is feeling pretty fatigued these days. Advised that Dr. Leonides Schanz will get the orders in by the beginning to middle of next week and she will be called to get these appts scheduled.  Pt voiced understanding.  Dr. Leonides Schanz made aware of the need for IV iron orders.

## 2022-11-19 NOTE — Telephone Encounter (Signed)
-----   Message from Ulysees Barns IV sent at 11/19/2022 10:00 AM EDT ----- Please let Ms. Coomer know that her labs do show persistent iron deficiency.  Her iron sat is 7% and her ferritin is below optimal.  Given these findings it would be appropriate to pursue another IV iron around if she is interested.  Please let me know if this is something she would like to pursue and we will order it ASAP. ----- Message ----- From: Interface, Lab In [place] Sent: 11/17/2022  10:40 AM EDT To: Jaci Standard, MD

## 2022-11-24 ENCOUNTER — Other Ambulatory Visit: Payer: Self-pay | Admitting: Hematology and Oncology

## 2022-11-25 ENCOUNTER — Telehealth: Payer: Self-pay

## 2022-11-25 NOTE — Telephone Encounter (Signed)
TCT to pt to inform her that IV iron has been ordered and she will receive a phone call by the end of the week to schedule an appt. Pt voiced understanding.

## 2022-11-25 NOTE — Telephone Encounter (Signed)
Dr. Leonides Schanz, Monoferric does not require an authorization for this patient. She will be scheduled as soon as possible.  Auth Submission: NO AUTH NEEDED Site of care: Site of care: [facility] Payer: Medicare A/B and BCBS supplement Medication & CPT/J Code(s) submitted: Monoferric (Ferrci derisomaltose) [phone] Route of submission (phone, fax, portal):  Phone # Fax # Auth type: Buy/Bill PB Units/visits requested: 1000 x 1 dose Reference number:  Approval from: 11/25/22 to 04/22/23   Medicare A/B will cover 80%, BCBS supplement will cover the remaining 20%.

## 2022-11-29 DIAGNOSIS — E113392 Type 2 diabetes mellitus with moderate nonproliferative diabetic retinopathy without macular edema, left eye: Secondary | ICD-10-CM | POA: Diagnosis not present

## 2022-11-30 ENCOUNTER — Ambulatory Visit (INDEPENDENT_AMBULATORY_CARE_PROVIDER_SITE_OTHER): Payer: Medicare Other

## 2022-11-30 VITALS — BP 125/68 | HR 57 | Temp 98.0°F | Resp 18 | Ht 60.0 in | Wt 204.0 lb

## 2022-11-30 DIAGNOSIS — D508 Other iron deficiency anemias: Secondary | ICD-10-CM

## 2022-11-30 DIAGNOSIS — D509 Iron deficiency anemia, unspecified: Secondary | ICD-10-CM

## 2022-11-30 MED ORDER — SODIUM CHLORIDE 0.9 % IV SOLN
1000.0000 mg | Freq: Once | INTRAVENOUS | Status: AC
  Administered 2022-11-30: 1000 mg via INTRAVENOUS
  Filled 2022-11-30: qty 10

## 2022-11-30 NOTE — Progress Notes (Signed)
Diagnosis: Iron Deficiency Anemia  Provider:  Chilton Greathouse MD  Procedure: IV Infusion  IV Type: Peripheral, IV Location: R Antecubital  Monoferric (Ferric Derisomaltose), Dose: 1000 ml  Infusion Start Time: 1047  Infusion Stop Time: 1113  Post Infusion IV Care: Observation period completed and Peripheral IV Discontinued  Discharge: Condition: Good, Destination: Home . AVS Declined  Performed by:  Rico Ala, LPN

## 2022-12-03 DIAGNOSIS — E113212 Type 2 diabetes mellitus with mild nonproliferative diabetic retinopathy with macular edema, left eye: Secondary | ICD-10-CM | POA: Diagnosis not present

## 2022-12-03 DIAGNOSIS — H35372 Puckering of macula, left eye: Secondary | ICD-10-CM | POA: Diagnosis not present

## 2022-12-03 DIAGNOSIS — H35362 Drusen (degenerative) of macula, left eye: Secondary | ICD-10-CM | POA: Diagnosis not present

## 2022-12-03 DIAGNOSIS — H43813 Vitreous degeneration, bilateral: Secondary | ICD-10-CM | POA: Diagnosis not present

## 2022-12-03 DIAGNOSIS — H34812 Central retinal vein occlusion, left eye, with macular edema: Secondary | ICD-10-CM | POA: Diagnosis not present

## 2022-12-03 DIAGNOSIS — H35033 Hypertensive retinopathy, bilateral: Secondary | ICD-10-CM | POA: Diagnosis not present

## 2022-12-03 DIAGNOSIS — H26492 Other secondary cataract, left eye: Secondary | ICD-10-CM | POA: Diagnosis not present

## 2022-12-20 DIAGNOSIS — C44229 Squamous cell carcinoma of skin of left ear and external auricular canal: Secondary | ICD-10-CM | POA: Diagnosis not present

## 2022-12-23 ENCOUNTER — Encounter: Payer: Medicare Other | Admitting: Physical Medicine & Rehabilitation

## 2022-12-28 ENCOUNTER — Encounter: Payer: Self-pay | Admitting: Physical Medicine & Rehabilitation

## 2022-12-28 ENCOUNTER — Encounter: Payer: Medicare Other | Attending: Physical Medicine & Rehabilitation | Admitting: Physical Medicine & Rehabilitation

## 2022-12-28 VITALS — BP 137/63 | HR 59 | Ht 60.0 in | Wt 205.0 lb

## 2022-12-28 DIAGNOSIS — H35372 Puckering of macula, left eye: Secondary | ICD-10-CM | POA: Diagnosis not present

## 2022-12-28 DIAGNOSIS — H26492 Other secondary cataract, left eye: Secondary | ICD-10-CM | POA: Diagnosis not present

## 2022-12-28 DIAGNOSIS — H61032 Chondritis of left external ear: Secondary | ICD-10-CM | POA: Diagnosis not present

## 2022-12-28 DIAGNOSIS — H35033 Hypertensive retinopathy, bilateral: Secondary | ICD-10-CM | POA: Diagnosis not present

## 2022-12-28 DIAGNOSIS — H34812 Central retinal vein occlusion, left eye, with macular edema: Secondary | ICD-10-CM | POA: Diagnosis not present

## 2022-12-28 DIAGNOSIS — H35362 Drusen (degenerative) of macula, left eye: Secondary | ICD-10-CM | POA: Diagnosis not present

## 2022-12-28 DIAGNOSIS — E113212 Type 2 diabetes mellitus with mild nonproliferative diabetic retinopathy with macular edema, left eye: Secondary | ICD-10-CM | POA: Diagnosis not present

## 2022-12-28 DIAGNOSIS — M1712 Unilateral primary osteoarthritis, left knee: Secondary | ICD-10-CM | POA: Diagnosis not present

## 2022-12-28 DIAGNOSIS — H43813 Vitreous degeneration, bilateral: Secondary | ICD-10-CM | POA: Diagnosis not present
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
## 2022-12-29 DIAGNOSIS — Z0000 Encounter for general adult medical examination without abnormal findings: Secondary | ICD-10-CM | POA: Diagnosis not present

## 2022-12-29 DIAGNOSIS — E1129 Type 2 diabetes mellitus with other diabetic kidney complication: Secondary | ICD-10-CM | POA: Diagnosis not present

## 2022-12-29 DIAGNOSIS — E559 Vitamin D deficiency, unspecified: Secondary | ICD-10-CM | POA: Diagnosis not present

## 2022-12-29 DIAGNOSIS — D649 Anemia, unspecified: Secondary | ICD-10-CM | POA: Diagnosis not present

## 2022-12-29 DIAGNOSIS — Z0189 Encounter for other specified special examinations: Secondary | ICD-10-CM | POA: Diagnosis not present

## 2022-12-29 DIAGNOSIS — E538 Deficiency of other specified B group vitamins: Secondary | ICD-10-CM | POA: Diagnosis not present

## 2022-12-29 DIAGNOSIS — R945 Abnormal results of liver function studies: Secondary | ICD-10-CM | POA: Diagnosis not present

## 2023-01-05 DIAGNOSIS — H43813 Vitreous degeneration, bilateral: Secondary | ICD-10-CM | POA: Diagnosis not present

## 2023-01-05 DIAGNOSIS — H348122 Central retinal vein occlusion, left eye, stable: Secondary | ICD-10-CM | POA: Diagnosis not present

## 2023-01-05 DIAGNOSIS — R82998 Other abnormal findings in urine: Secondary | ICD-10-CM | POA: Diagnosis not present

## 2023-01-05 DIAGNOSIS — Z961 Presence of intraocular lens: Secondary | ICD-10-CM | POA: Diagnosis not present

## 2023-01-05 DIAGNOSIS — E1129 Type 2 diabetes mellitus with other diabetic kidney complication: Secondary | ICD-10-CM | POA: Diagnosis not present

## 2023-01-05 DIAGNOSIS — N184 Chronic kidney disease, stage 4 (severe): Secondary | ICD-10-CM | POA: Diagnosis not present

## 2023-01-05 DIAGNOSIS — H26492 Other secondary cataract, left eye: Secondary | ICD-10-CM | POA: Diagnosis not present

## 2023-01-05 DIAGNOSIS — E785 Hyperlipidemia, unspecified: Secondary | ICD-10-CM | POA: Diagnosis not present

## 2023-01-05 DIAGNOSIS — Z794 Long term (current) use of insulin: Secondary | ICD-10-CM | POA: Diagnosis not present

## 2023-01-05 DIAGNOSIS — I130 Hypertensive heart and chronic kidney disease with heart failure and stage 1 through stage 4 chronic kidney disease, or unspecified chronic kidney disease: Secondary | ICD-10-CM | POA: Diagnosis not present

## 2023-01-05 DIAGNOSIS — H35372 Puckering of macula, left eye: Secondary | ICD-10-CM | POA: Diagnosis not present

## 2023-01-05 DIAGNOSIS — I5032 Chronic diastolic (congestive) heart failure: Secondary | ICD-10-CM | POA: Diagnosis not present

## 2023-01-05 DIAGNOSIS — F339 Major depressive disorder, recurrent, unspecified: Secondary | ICD-10-CM | POA: Diagnosis not present

## 2023-01-05 DIAGNOSIS — I10 Essential (primary) hypertension: Secondary | ICD-10-CM | POA: Diagnosis not present

## 2023-01-05 DIAGNOSIS — E113312 Type 2 diabetes mellitus with moderate nonproliferative diabetic retinopathy with macular edema, left eye: Secondary | ICD-10-CM | POA: Diagnosis not present

## 2023-01-05 DIAGNOSIS — L509 Urticaria, unspecified: Secondary | ICD-10-CM | POA: Diagnosis not present

## 2023-01-05 DIAGNOSIS — J309 Allergic rhinitis, unspecified: Secondary | ICD-10-CM | POA: Diagnosis not present

## 2023-01-05 DIAGNOSIS — Z0000 Encounter for general adult medical examination without abnormal findings: Secondary | ICD-10-CM | POA: Diagnosis not present

## 2023-01-05 DIAGNOSIS — D61818 Other pancytopenia: Secondary | ICD-10-CM | POA: Diagnosis not present

## 2023-01-19 DIAGNOSIS — M1712 Unilateral primary osteoarthritis, left knee: Secondary | ICD-10-CM | POA: Diagnosis not present

## 2023-01-31 DIAGNOSIS — N390 Urinary tract infection, site not specified: Secondary | ICD-10-CM | POA: Diagnosis not present

## 2023-01-31 DIAGNOSIS — N1831 Chronic kidney disease, stage 3a: Secondary | ICD-10-CM | POA: Diagnosis not present

## 2023-02-01 ENCOUNTER — Encounter: Payer: Medicare Other | Admitting: Physical Medicine & Rehabilitation

## 2023-02-02 ENCOUNTER — Inpatient Hospital Stay: Payer: Medicare Other | Attending: Hematology

## 2023-02-02 ENCOUNTER — Other Ambulatory Visit: Payer: Self-pay | Admitting: Hematology and Oncology

## 2023-02-02 DIAGNOSIS — R809 Proteinuria, unspecified: Secondary | ICD-10-CM | POA: Diagnosis not present

## 2023-02-02 DIAGNOSIS — D500 Iron deficiency anemia secondary to blood loss (chronic): Secondary | ICD-10-CM

## 2023-02-02 DIAGNOSIS — N2581 Secondary hyperparathyroidism of renal origin: Secondary | ICD-10-CM | POA: Diagnosis not present

## 2023-02-02 DIAGNOSIS — D696 Thrombocytopenia, unspecified: Secondary | ICD-10-CM | POA: Insufficient documentation

## 2023-02-02 DIAGNOSIS — I129 Hypertensive chronic kidney disease with stage 1 through stage 4 chronic kidney disease, or unspecified chronic kidney disease: Secondary | ICD-10-CM | POA: Diagnosis not present

## 2023-02-02 DIAGNOSIS — Z9884 Bariatric surgery status: Secondary | ICD-10-CM | POA: Diagnosis not present

## 2023-02-02 DIAGNOSIS — D631 Anemia in chronic kidney disease: Secondary | ICD-10-CM | POA: Diagnosis not present

## 2023-02-02 DIAGNOSIS — Z79899 Other long term (current) drug therapy: Secondary | ICD-10-CM | POA: Diagnosis not present

## 2023-02-02 DIAGNOSIS — N1831 Chronic kidney disease, stage 3a: Secondary | ICD-10-CM | POA: Diagnosis not present

## 2023-02-02 DIAGNOSIS — D72819 Decreased white blood cell count, unspecified: Secondary | ICD-10-CM | POA: Insufficient documentation

## 2023-02-02 DIAGNOSIS — N1832 Chronic kidney disease, stage 3b: Secondary | ICD-10-CM | POA: Diagnosis not present

## 2023-02-02 DIAGNOSIS — D509 Iron deficiency anemia, unspecified: Secondary | ICD-10-CM | POA: Diagnosis not present

## 2023-02-02 LAB — CBC WITH DIFFERENTIAL (CANCER CENTER ONLY)
Abs Immature Granulocytes: 0.01 10*3/uL (ref 0.00–0.07)
Basophils Absolute: 0 10*3/uL (ref 0.0–0.1)
Basophils Relative: 0 %
Eosinophils Absolute: 0 10*3/uL (ref 0.0–0.5)
Eosinophils Relative: 0 %
HCT: 31.4 % — ABNORMAL LOW (ref 36.0–46.0)
Hemoglobin: 10.9 g/dL — ABNORMAL LOW (ref 12.0–15.0)
Immature Granulocytes: 1 %
Lymphocytes Relative: 32 %
Lymphs Abs: 0.7 10*3/uL (ref 0.7–4.0)
MCH: 29.9 pg (ref 26.0–34.0)
MCHC: 34.7 g/dL (ref 30.0–36.0)
MCV: 86 fL (ref 80.0–100.0)
Monocytes Absolute: 0.2 10*3/uL (ref 0.1–1.0)
Monocytes Relative: 10 %
Neutro Abs: 1.2 10*3/uL — ABNORMAL LOW (ref 1.7–7.7)
Neutrophils Relative %: 57 %
Platelet Count: 108 10*3/uL — ABNORMAL LOW (ref 150–400)
RBC: 3.65 MIL/uL — ABNORMAL LOW (ref 3.87–5.11)
RDW: 14.5 % (ref 11.5–15.5)
WBC Count: 2.2 10*3/uL — ABNORMAL LOW (ref 4.0–10.5)
nRBC: 0 % (ref 0.0–0.2)

## 2023-02-02 LAB — CMP (CANCER CENTER ONLY)
ALT: 16 U/L (ref 0–44)
AST: 18 U/L (ref 15–41)
Albumin: 4.3 g/dL (ref 3.5–5.0)
Alkaline Phosphatase: 68 U/L (ref 38–126)
Anion gap: 7 (ref 5–15)
BUN: 56 mg/dL — ABNORMAL HIGH (ref 8–23)
CO2: 30 mmol/L (ref 22–32)
Calcium: 9.7 mg/dL (ref 8.9–10.3)
Chloride: 103 mmol/L (ref 98–111)
Creatinine: 1.44 mg/dL — ABNORMAL HIGH (ref 0.44–1.00)
GFR, Estimated: 39 mL/min — ABNORMAL LOW (ref >60)
Glucose, Bld: 102 mg/dL — ABNORMAL HIGH (ref 70–99)
Potassium: 4.3 mmol/L (ref 3.5–5.1)
Sodium: 140 mmol/L (ref 135–145)
Total Bilirubin: 0.4 mg/dL (ref <1.2)
Total Protein: 7.6 g/dL (ref 6.5–8.1)

## 2023-02-02 LAB — IRON AND IRON BINDING CAPACITY (CC-WL,HP ONLY)
Iron: 62 ug/dL (ref 28–170)
Saturation Ratios: 16 % (ref 10.4–31.8)
TIBC: 395 ug/dL (ref 250–450)
UIBC: 333 ug/dL (ref 148–442)

## 2023-02-02 LAB — RETIC PANEL
Immature Retic Fract: 13.2 % (ref 2.3–15.9)
RBC.: 3.6 MIL/uL — ABNORMAL LOW (ref 3.87–5.11)
Retic Count, Absolute: 61.6 10*3/uL (ref 19.0–186.0)
Retic Ct Pct: 1.7 % (ref 0.4–3.1)
Reticulocyte Hemoglobin: 33.1 pg (ref >27.9)

## 2023-02-02 LAB — FERRITIN: Ferritin: 94 ng/mL (ref 11–307)

## 2023-02-08 ENCOUNTER — Other Ambulatory Visit: Payer: Self-pay | Admitting: Orthopaedic Surgery

## 2023-02-09 ENCOUNTER — Inpatient Hospital Stay (HOSPITAL_BASED_OUTPATIENT_CLINIC_OR_DEPARTMENT_OTHER): Payer: Medicare Other | Admitting: Hematology and Oncology

## 2023-02-09 ENCOUNTER — Other Ambulatory Visit: Payer: Self-pay | Admitting: Hematology and Oncology

## 2023-02-09 VITALS — BP 127/55 | HR 66 | Temp 98.0°F | Resp 13 | Wt 205.4 lb

## 2023-02-09 DIAGNOSIS — D500 Iron deficiency anemia secondary to blood loss (chronic): Secondary | ICD-10-CM

## 2023-02-09 DIAGNOSIS — R161 Splenomegaly, not elsewhere classified: Secondary | ICD-10-CM | POA: Diagnosis not present

## 2023-02-09 DIAGNOSIS — Z79899 Other long term (current) drug therapy: Secondary | ICD-10-CM | POA: Diagnosis not present

## 2023-02-09 DIAGNOSIS — Z9884 Bariatric surgery status: Secondary | ICD-10-CM | POA: Diagnosis not present

## 2023-02-09 DIAGNOSIS — D61818 Other pancytopenia: Secondary | ICD-10-CM

## 2023-02-09 DIAGNOSIS — D72819 Decreased white blood cell count, unspecified: Secondary | ICD-10-CM | POA: Diagnosis not present

## 2023-02-09 DIAGNOSIS — D696 Thrombocytopenia, unspecified: Secondary | ICD-10-CM | POA: Diagnosis not present

## 2023-02-09 DIAGNOSIS — D509 Iron deficiency anemia, unspecified: Secondary | ICD-10-CM | POA: Diagnosis not present
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
## 2023-02-20 ENCOUNTER — Encounter: Payer: Self-pay | Admitting: Physician Assistant

## 2023-02-21 DIAGNOSIS — M25662 Stiffness of left knee, not elsewhere classified: Secondary | ICD-10-CM | POA: Diagnosis not present

## 2023-02-21 DIAGNOSIS — M1732 Unilateral post-traumatic osteoarthritis, left knee: Secondary | ICD-10-CM | POA: Diagnosis not present

## 2023-02-21 NOTE — Progress Notes (Addendum)
PCP - Rodrigo Ran, MD  Cardiologist -   PPM/ICD -  Device Orders -  Rep Notified -   Chest x-ray - 07-21-22 epic EKG - 07-21-22 epic Stress Test -  ECHO - 02-22-22 epic Cardiac Cath -   Sleep Study -  CPAP -   Fasting Blood Sugar -  Checks Blood Sugar _____ times a day  Blood Thinner Instructions: Aspirin Instructions:  ERAS Protcol - PRE-SURGERY Ensure or G2-    COVID vaccine -  Activity-- Anesthesia review:   Patient denies shortness of breath, fever, cough and chest pain at PAT appointment   All instructions explained to the patient, with a verbal understanding of the material. Patient agrees to go over the instructions while at home for a better understanding. Patient also instructed to self quarantine after being tested for COVID-19. The opportunity to ask questions was provided.

## 2023-02-21 NOTE — Patient Instructions (Addendum)
SURGICAL WAITING ROOM VISITATION  Patients having surgery or a procedure may have no more than 2 support people in the waiting area - these visitors may rotate.    Children under the age of 77 must have an adult with them who is not the patient.   If the patient needs to stay at the hospital during part of their recovery, the visitor guidelines for inpatient rooms apply. Pre-op nurse will coordinate an appropriate time for 1 support person to accompany patient in pre-op.  This support person may not rotate.    Please refer to the [facility] website for the visitor guidelines for Inpatients (after your surgery is over and you are in a regular room).       Your procedure is scheduled on: 03-01-23    Report to [facility] Main Entrance    Report to admitting at      0930  AM   Call this number if you have problems the morning of surgery [phone]   Do not eat food :After Midnight.   After Midnight you may have the following liquids until _0900 _____ AM/ DAY OF SURGERY  then nothing by mouth  Water Non-Citrus Juices (without pulp, NO RED-Apple, White grape, White cranberry) Black Coffee (NO MILK/CREAM OR CREAMERS, sugar ok)  Clear Tea (NO MILK/CREAM OR CREAMERS, sugar ok) regular and decaf                             Plain Jell-O (NO RED)                                           Fruit ices (not with fruit pulp, NO RED)                                     Popsicles (NO RED)                                                               Sports drinks like Gatorade (NO RED)                     The day of surgery:  Drink ONE (1) Pre-Surgery  G2 at 0850  AM the morning of surgery. Drink in one sitting. Do not sip.  This drink was given to you during your hospital  pre-op appointment visit. Nothing else to drink after completing the  Pre-Surgery  G2   by 0900 am then nothing by mouth.          If you have questions, please contact your surgeon's office.   FOLLOW ANY  ADDITIONAL PRE OP INSTRUCTIONS YOU RECEIVED FROM YOUR SURGEON'S OFFICE!!!     Oral Hygiene is also important to reduce your risk of infection.                                    Remember - BRUSH YOUR TEETH THE MORNING OF SURGERY WITH YOUR REGULAR TOOTHPASTE  DENTURES WILL BE REMOVED PRIOR TO SURGERY PLEASE DO NOT APPLY "Poly grip" OR ADHESIVES!!!   Do NOT smoke after Midnight   Stop all vitamins and herbal supplements 7 days before surgery.   Take these medicines the morning of surgery with A SIP OF WATER: bring rescue inhaler with you, oxybutin, ropinirole, propythiouracil (PTU) , metoprolol, gabapentin, doxazosin, tylenol if needed   How to Manage Your Diabetes Before and After Surgery  Why is it important to control my blood sugar before and after surgery? Improving blood sugar levels before and after surgery helps healing and can limit problems. A way of improving blood sugar control is eating a healthy diet by:  Eating less sugar and carbohydrates  Increasing activity/exercise  Talking with your doctor about reaching your blood sugar goals High blood sugars (greater than 180 mg/dL) can raise your risk of infections and slow your recovery, so you will need to focus on controlling your diabetes during the weeks before surgery. Make sure that the doctor who takes care of your diabetes knows about your planned surgery including the date and location.  How do I manage my blood sugar before surgery? Check your blood sugar at least 4 times a day, starting 2 days before surgery, to make sure that the level is not too high or low. Check your blood sugar the morning of your surgery when you wake up and every 2 hours until you get to the Short Stay unit. If your blood sugar is less than 70 mg/dL, you will need to treat for low blood sugar: Do not take insulin. Treat a low blood sugar (less than 70 mg/dL) with  cup of clear juice (cranberry or apple), 4 glucose tablets, OR glucose  gel. Recheck blood sugar in 15 minutes after treatment (to make sure it is greater than 70 mg/dL). If your blood sugar is not greater than 70 mg/dL on recheck, call [phone] for further instructions. Report your blood sugar to the short stay nurse when you get to Short Stay.  If you are admitted to the hospital after surgery: Your blood sugar will be checked by the staff and you will probably be given insulin after surgery (instead of oral diabetes medicines) to make sure you have good blood sugar levels. The goal for blood sugar control after surgery is 80-180 mg/dL.   WHAT DO I DO ABOUT MY DIABETES MEDICATION?  Do not take oral diabetes medicines (pills) the morning of surgery.  THE NIGHT BEFORE SURGERY, take  50% of dinner/bedtime dose if  insulin Glargine          THE MORNING OF SURGERY, take take 50% of your normal dose of insulin glargine     If your CBG is greater than 220 mg/dL, you may take  of your sliding scale  (correction) dose of insulin.    DO NOT TAKE ANY ORAL DIABETIC MEDICATIONS DAY OF YOUR SURGERY  Bring CPAP mask and tubing day of surgery.                              You may not have any metal on your body including hair pins, jewelry, and body piercing             Do not wear make-up, lotions, powders, perfumes/cologne, or deodorant  Do not wear nail polish including gel and S&S, artificial/acrylic nails, or any other type of covering on natural nails including finger and toenails. If  you have artificial nails, gel coating, etc. that needs to be removed by a nail salon please have this removed prior to surgery or surgery may need to be canceled/ delayed if the surgeon/ anesthesia feels like they are unable to be safely monitored.   Do not shave  5 days prior to surgery.      Do not bring valuables to the hospital. [facility] IS NOT             RESPONSIBLE   FOR VALUABLES.   Contacts, glasses, dentures or bridgework may not be worn into  surgery.   Bring small overnight bag day of surgery.   DO NOT BRING YOUR HOME MEDICATIONS TO THE HOSPITAL. PHARMACY WILL DISPENSE MEDICATIONS LISTED ON YOUR MEDICATION LIST TO YOU DURING YOUR ADMISSION IN THE HOSPITAL!    Patients discharged on the day of surgery will not be allowed to drive home.  Someone NEEDS to stay with you for the first 24 hours after anesthesia.   Special Instructions: Bring a copy of your healthcare power of attorney and living will documents the day of surgery if you haven't scanned them before.              Please read over the following fact sheets you were given: IF YOU HAVE QUESTIONS ABOUT YOUR PRE-OP INSTRUCTIONS PLEASE CALL [phone]    If you test positive for Covid or have been in contact with anyone that has tested positive in the last 10 days please notify you surgeon.      Pre-operative 5 CHG Bath Instructions   You can play a key role in reducing the risk of infection after surgery. Your skin needs to be as free of germs as possible. You can reduce the number of germs on your skin by washing with CHG (chlorhexidine gluconate) soap before surgery. CHG is an antiseptic soap that kills germs and continues to kill germs even after washing.   DO NOT use if you have an allergy to chlorhexidine/CHG or antibacterial soaps. If your skin becomes reddened or irritated, stop using the CHG and notify one of our RNs at [phone].   Please shower with the CHG soap starting 4 days before surgery using the following schedule:     Please keep in mind the following:  DO NOT shave, including legs and underarms, starting the day of your first shower.   You may shave your face at any point before/day of surgery.  Place clean sheets on your bed the day you start using CHG soap. Use a clean washcloth (not used since being washed) for each shower. DO NOT sleep with pets once you start using the CHG.   CHG Shower Instructions:  If you choose to wash your hair and  private area, wash first with your normal shampoo/soap.  After you use shampoo/soap, rinse your hair and body thoroughly to remove shampoo/soap residue.  Turn the water OFF and apply about 3 tablespoons (45 ml) of CHG soap to a CLEAN washcloth.  Apply CHG soap ONLY FROM YOUR NECK DOWN TO YOUR TOES (washing for 3-5 minutes)  DO NOT use CHG soap on face, private areas, open wounds, or sores.  Pay special attention to the area where your surgery is being performed.  If you are having back surgery, having someone wash your back for you may be helpful. Wait 2 minutes after CHG soap is applied, then you may rinse off the CHG soap.  Pat dry with a clean towel  Put  on clean clothes/pajamas   If you choose to wear lotion, please use ONLY the CHG-compatible lotions on the back of this paper.     Additional instructions for the day of surgery: DO NOT APPLY any lotions, deodorants, cologne, or perfumes.   Put on clean/comfortable clothes.  Brush your teeth.  Ask your nurse before applying any prescription medications to the skin.      CHG Compatible Lotions   Aveeno Moisturizing lotion  Cetaphil Moisturizing Cream  Cetaphil Moisturizing Lotion  Clairol Herbal Essence Moisturizing Lotion, Dry Skin  Clairol Herbal Essence Moisturizing Lotion, Extra Dry Skin  Clairol Herbal Essence Moisturizing Lotion, Normal Skin  Curel Age Defying Therapeutic Moisturizing Lotion with Alpha Hydroxy  Curel Extreme Care Body Lotion  Curel Soothing Hands Moisturizing Hand Lotion  Curel Therapeutic Moisturizing Cream, Fragrance-Free  Curel Therapeutic Moisturizing Lotion, Fragrance-Free  Curel Therapeutic Moisturizing Lotion, Original Formula  Eucerin Daily Replenishing Lotion  Eucerin Dry Skin Therapy Plus Alpha Hydroxy Crme  Eucerin Dry Skin Therapy Plus Alpha Hydroxy Lotion  Eucerin Original Crme  Eucerin Original Lotion  Eucerin Plus Crme Eucerin Plus Lotion  Eucerin TriLipid Replenishing Lotion  Keri  Anti-Bacterial Hand Lotion  Keri Deep Conditioning Original Lotion Dry Skin Formula Softly Scented  Keri Deep Conditioning Original Lotion, Fragrance Free Sensitive Skin Formula  Keri Lotion Fast Absorbing Fragrance Free Sensitive Skin Formula  Keri Lotion Fast Absorbing Softly Scented Dry Skin Formula  Keri Original Lotion  Keri Skin Renewal Lotion Keri Silky Smooth Lotion  Keri Silky Smooth Sensitive Skin Lotion  Nivea Body Creamy Conditioning Oil  Nivea Body Extra Enriched Lotion  Nivea Body Original Lotion  Nivea Body Sheer Moisturizing Lotion Nivea Crme  Nivea Skin Firming Lotion  NutraDerm 30 Skin Lotion  NutraDerm Skin Lotion  NutraDerm Therapeutic Skin Cream  NutraDerm Therapeutic Skin Lotion  ProShield Protective Hand Cream  Provon moisturizing lotion   Incentive Spirometer  An incentive spirometer is a tool that can help keep your lungs clear and active. This tool measures how well you are filling your lungs with each breath. Taking long deep breaths may help reverse or decrease the chance of developing breathing (pulmonary) problems (especially infection) following: A long period of time when you are unable to move or be active. BEFORE THE PROCEDURE  If the spirometer includes an indicator to show your best effort, your nurse or respiratory therapist will set it to a desired goal. If possible, sit up straight or lean slightly forward. Try not to slouch. Hold the incentive spirometer in an upright position. INSTRUCTIONS FOR USE  Sit on the edge of your bed if possible, or sit up as far as you can in bed or on a chair. Hold the incentive spirometer in an upright position. Breathe out normally. Place the mouthpiece in your mouth and seal your lips tightly around it. Breathe in slowly and as deeply as possible, raising the piston or the ball toward the top of the column. Hold your breath for 3-5 seconds or for as long as possible. Allow the piston or ball to fall to the  bottom of the column. Remove the mouthpiece from your mouth and breathe out normally. Rest for a few seconds and repeat Steps 1 through 7 at least 10 times every 1-2 hours when you are awake. Take your time and take a few normal breaths between deep breaths. The spirometer may include an indicator to show your best effort. Use the indicator as a goal to work toward during each repetition.  After each set of 10 deep breaths, practice coughing to be sure your lungs are clear. If you have an incision (the cut made at the time of surgery), support your incision when coughing by placing a pillow or rolled up towels firmly against it. Once you are able to get out of bed, walk around indoors and cough well. You may stop using the incentive spirometer when instructed by your caregiver.  RISKS AND COMPLICATIONS Take your time so you do not get dizzy or light-headed. If you are in pain, you may need to take or ask for pain medication before doing incentive spirometry. It is harder to take a deep breath if you are having pain. AFTER USE Rest and breathe slowly and easily. It can be helpful to keep track of a log of your progress. Your caregiver can provide you with a simple table to help with this. If you are using the spirometer at home, follow these instructions: SEEK MEDICAL CARE IF:  You are having difficultly using the spirometer. You have trouble using the spirometer as often as instructed. Your pain medication is not giving enough relief while using the spirometer. You develop fever of 100.5 F (38.1 C) or higher. SEEK IMMEDIATE MEDICAL CARE IF:  You cough up bloody sputum that had not been present before. You develop fever of 102 F (38.9 C) or greater. You develop worsening pain at or near the incision site. MAKE SURE YOU:  Understand these instructions. Will watch your condition. Will get help right away if you are not doing well or get worse. Document Released: 07/19/2006 Document Revised:  05/31/2011 Document Reviewed: 09/19/2006 [facility] Patient Information 2014 [place], [place].   ________________________________________________________________________

## 2023-02-23 ENCOUNTER — Encounter (HOSPITAL_COMMUNITY): Payer: Self-pay

## 2023-02-23 ENCOUNTER — Encounter (HOSPITAL_COMMUNITY)
Admission: RE | Admit: 2023-02-23 | Discharge: 2023-02-23 | Disposition: A | Discharge status: Home / Self Care | Payer: Medicare Other | Source: Ambulatory Visit | Attending: Orthopaedic Surgery | Admitting: Orthopaedic Surgery

## 2023-02-23 ENCOUNTER — Other Ambulatory Visit: Payer: Self-pay

## 2023-02-23 VITALS — BP 131/56 | HR 61 | Temp 97.7°F | Ht 60.0 in | Wt 202.0 lb

## 2023-02-23 DIAGNOSIS — E1169 Type 2 diabetes mellitus with other specified complication: Secondary | ICD-10-CM | POA: Diagnosis not present

## 2023-02-23 DIAGNOSIS — Z9889 Other specified postprocedural states: Secondary | ICD-10-CM | POA: Diagnosis not present

## 2023-02-23 DIAGNOSIS — I129 Hypertensive chronic kidney disease with stage 1 through stage 4 chronic kidney disease, or unspecified chronic kidney disease: Secondary | ICD-10-CM | POA: Insufficient documentation

## 2023-02-23 DIAGNOSIS — Z01812 Encounter for preprocedural laboratory examination: Secondary | ICD-10-CM | POA: Diagnosis not present

## 2023-02-23 DIAGNOSIS — M797 Fibromyalgia: Secondary | ICD-10-CM | POA: Insufficient documentation

## 2023-02-23 DIAGNOSIS — Z79899 Other long term (current) drug therapy: Secondary | ICD-10-CM | POA: Insufficient documentation

## 2023-02-23 DIAGNOSIS — G4733 Obstructive sleep apnea (adult) (pediatric): Secondary | ICD-10-CM | POA: Diagnosis not present

## 2023-02-23 DIAGNOSIS — Z01818 Encounter for other preprocedural examination: Secondary | ICD-10-CM

## 2023-02-23 DIAGNOSIS — E669 Obesity, unspecified: Secondary | ICD-10-CM

## 2023-02-23 DIAGNOSIS — J45909 Unspecified asthma, uncomplicated: Secondary | ICD-10-CM | POA: Diagnosis not present

## 2023-02-23 DIAGNOSIS — Z6839 Body mass index (BMI) 39.0-39.9, adult: Secondary | ICD-10-CM | POA: Diagnosis not present

## 2023-02-23 DIAGNOSIS — F32A Depression, unspecified: Secondary | ICD-10-CM | POA: Insufficient documentation

## 2023-02-23 DIAGNOSIS — F419 Anxiety disorder, unspecified: Secondary | ICD-10-CM | POA: Insufficient documentation

## 2023-02-23 DIAGNOSIS — K219 Gastro-esophageal reflux disease without esophagitis: Secondary | ICD-10-CM | POA: Diagnosis not present

## 2023-02-23 DIAGNOSIS — N189 Chronic kidney disease, unspecified: Secondary | ICD-10-CM | POA: Diagnosis not present

## 2023-02-23 DIAGNOSIS — E1122 Type 2 diabetes mellitus with diabetic chronic kidney disease: Secondary | ICD-10-CM | POA: Insufficient documentation

## 2023-02-23 DIAGNOSIS — Z794 Long term (current) use of insulin: Secondary | ICD-10-CM | POA: Diagnosis not present

## 2023-02-23 HISTORY — DX: Pneumonia, unspecified organism: J18.9

## 2023-02-23 HISTORY — DX: Malignant (primary) neoplasm, unspecified: C80.1

## 2023-02-23 LAB — GLUCOSE, CAPILLARY: Glucose-Capillary: 91 mg/dL (ref 70–99)

## 2023-02-23 LAB — HEMOGLOBIN A1C
Hgb A1c MFr Bld: 5.7 % — ABNORMAL HIGH (ref 4.8–5.6)
Mean Plasma Glucose: 116.89 mg/dL

## 2023-02-23 LAB — SURGICAL PCR SCREEN
MRSA, PCR: NEGATIVE
Staphylococcus aureus: NEGATIVE

## 2023-02-23 NOTE — Progress Notes (Addendum)
For Anesthesia: PCP - Rodrigo Ran, MD  Cardiologist - N/A  Bowel Prep reminder:  Chest x-ray - 07-21-22 epic  EKG - 07-21-22 epic  Stress Test -  ECHO -  02-22-22 epic  Cardiac Cath -  Pacemaker/ICD device last checked: Pacemaker orders received: Device Rep notified:  Spinal Cord Stimulator: N/A  Sleep Study - Yes CPAP - Yes  Fasting Blood Sugar - 80's - 90's Checks Blood Sugar : continuous monitor Date and result of last Hgb A1c-  Last dose of GLP1 agonist-  N/A GLP1 instructions:   Last dose of SGLT-2 inhibitors- N/A SGLT-2 instructions:   Blood Thinner Instructions: N/A Aspirin Instructions: Last Dose:  Activity level: Can go up a flight of stairs and activities of daily living without stopping and without chest pain and/or shortness of breath      Unable to go up a flight of stairs due to knee pain.    Anesthesia review: DIA,OSA (CPAP), Heart murmur, CKD III,HTN, Non-healing left groin open wound with daily wound change ( Surgeon is aware).  Patient denies shortness of breath, fever, cough and chest pain at PAT appointment   Patient verbalized understanding of instructions that were given to them at the PAT appointment. Patient was also instructed that they will need to review over the PAT instructions again at home before surgery.

## 2023-02-24 NOTE — Progress Notes (Signed)
DISCUSSION: [name] is a 72 yo female who presents to PAT prior to L TKA on 03/01/23 with Dr. Jerl Santos. PMH of HTN, mild AI, asthma, OSA (uses CPAP), GERD, CKD, IDDM, pancytopenia, hx of C1 fx, arthritis, fibromyalgia, anxiety, depression, obesity (BMI 39)  Prior anesthesia complication includes PONV. Pt has limited ROM of neck due to prior C1 fx  Patient follows with Hematology due to known pancytopenia. Hgb stable/improved at 10/9. Recent Iron levels were WNL. WBC 2.2 which is lower than prior. Pt denies infectious symptoms. Platelets 108. Dr. Leonides Schanz suggested that leukopenia and thrombocytopenia are possibly from cirrhosis. Will repeat CBC DOS.  Pt follows with PCP for chronic medical issues. BP controlled. A1c was 5.7 on PAT labs. CKD at baseline.  VS: BP (!) 131/56   Pulse 61   Temp 36.5 C (Oral)   Ht 5' (1.524 m)   Wt 91.6 kg   SpO2 100%   BMI 39.45 kg/m   PROVIDERS: Rodrigo Ran, MD Hematology: Jeanie Sewer, MD  LABS: Labs reviewed: Acceptable for surgery. Pancytopenia is known and pt followed by Hematology. CKD at baseline. (all labs ordered are listed, but only abnormal results are displayed)  Labs Reviewed  HEMOGLOBIN A1C - Abnormal; Notable for the following components:      Result Value   Hgb A1c MFr Bld 5.7 (*)    All other components within normal limits  SURGICAL PCR SCREEN  GLUCOSE, CAPILLARY     IMAGES:   EKG:   CV:  Echo 02/22/2022:  IMPRESSIONS    1. Left ventricular ejection fraction, by estimation, is 55 to 60%. The left ventricle has normal function. The left ventricle has no regional wall motion abnormalities. Left ventricular diastolic parameters are consistent with Grade I diastolic dysfunction (impaired relaxation). Elevated left atrial pressure.  2. Right ventricular systolic function is normal. The right ventricular size is normal.  3. Left atrial size was mildly dilated.  4. The mitral valve is normal in structure. Mild mitral  valve regurgitation.  5. The aortic valve is tricuspid. Aortic valve regurgitation is mild.  6. The inferior vena cava is normal in size with greater than 50% respiratory variability, suggesting right atrial pressure of 3 mmHg.  Comparison(s): The left ventricular function is unchanged. Past Medical History:  Diagnosis Date   Anemia    Arthritis    Asthma    OCCAS   Bell's palsy    Broken neck (HCC)    C-1   Cancer (HCC)    left ear squamus cell   Carbuncle and furuncle of trunk    Chronic dislocation of right shoulder    Depression    Disorder of fascia    HX OF NECROTIC FASCITIS AFTER ABDOMINAL SURGERY FOR HERNIA- REQUIRED 19 SURGERIES AND 2.5 MONTH HOSPITALIZATION AT [facility]   DM (diabetes mellitus) (HCC)    Dysrhythmia    HX OF TACHYCARDIA AND BRADYCARDIA - ON GOING FOR YEARS - DOES NOT HAVE TO SEE CARDIOLOGIST   Elevated cholesterol    Fibromyalgia    Fracture 07/2012   HX OF FRACTURED NECK C1- CAUSES SEVERE HEADACHES--LIMITED ROM NECK   Frequent infections    ESPECIALLY PRONE TO INFECTIONS AFTER SURGERIES   Graves disease    Heart murmur    DOES NOT CAUSE ANY PROBLEMS   History of kidney stones    Hyperlipidemia    Hypertension    Hypothyroidism    GRAVES DISEASE   Migraine    Nephrolithiasis    STAGE 3  DR. Heloise Purpura  UROLOGIST   Neuropathy    Obesity    OSA (obstructive sleep apnea)    USES CPAP - DOES NOT KNOW SETTING   Pain    LEFT SHOULDER  PAIN -HARD TO LIE ON LEFT SIDE FOR LONG PERIOD;  PAIN IN LOWER BADK - 3 HERNIATED DISCS AND STENOISIS -   Pneumonia    PONV (postoperative nausea and vomiting)    THE GAS MAKES ME NAUSEATED   Restless leg syndrome    Sciatica    Tachycardia    Urinary frequency    Urticaria    UTI (urinary tract infection)     Past Surgical History:  Procedure Laterality Date   ABDOMINAL HYSTERECTOMY     LARGE TUMOR AT OVARY REMOVED   ANTERIOR LAT LUMBAR FUSION N/A 07/28/2018   Procedure: Anterolateral Decompression  Lumbar One-Two for osteomyelitis reconstruction w/titanium strut allograft Fusion Lumbar One-Two;  Surgeon: Barnett Abu, MD;  Location: [facility] OR;  Service: Neurosurgery;  Laterality: N/A;  Left anterolateral approach   APPENDECTOMY     APPLICATION OF ROBOTIC ASSISTANCE FOR SPINAL PROCEDURE N/A 07/28/2018   Procedure: APPLICATION OF ROBOTIC ASSISTANCE FOR SPINAL PROCEDURE;  Surgeon: Barnett Abu, MD;  Location: [facility] OR;  Service: Neurosurgery;  Laterality: N/A;   CARPAL TUNNEL RELEASE     BILATERAL   CATARACT EXTRACTION W/ INTRAOCULAR LENS  IMPLANT, BILATERAL     CESAREAN SECTION     X 3   CHOLECYSTECTOMY     CYSTOSCOPY WITH URETEROSCOPY Right 12/03/2013   Procedure: CYSTOSCOPY WITH RIGHT RETROGRADE URETEROSCOPY LASER LITHOTRIPSY RIGHT STONE RIGHT URETERAL STENT, ;  Surgeon: Heloise Purpura, MD;  Location: [facility] ORS;  Service: Urology;  Laterality: Right;  PROCEDURE WAS ORIGINALLY SCHEDULED AS RIGHT PERCUTANEOUS NEPHROLITHOTOMY   EYELID LACERATION REPAIR     RIGHT EYE   HERNIA REPAIR     ABDOMINAL HERNIA REPAIR WITH MESH - 3 SURGERIES    HX OF 19 SURGERIES FOR NECROTIC FASCITIS     SKIN GRAFTS+ WOUND  VAC   I&D OF INFECTED SITE IN [place] - FROM AN INJECTION     INCISION AND DRAINAGE ABSCESS Left 07/23/2022   Procedure: INCISION AND DRAINAGE ABSCESS;  Surgeon: Darnell Level, MD;  Location: [facility];  Service: General;  Laterality: Left;   IR FLUORO GUIDED NEEDLE PLC ASPIRATION/INJECTION LOC  06/03/2018   JOINT REPLACEMENT     TOTAL RIGHT KNEE REPLACEMENT   KNEE ARTHROSCOPY  RIGHT AND LEFT   X 2   LAPAROSCOPIC GASTRIC SLEEVE RESECTION  2014   NEPHROLITHOTOMY Right 12/10/2013   Procedure: NEPHROLITHOTOMY PERCUTANEOUS;  Surgeon: Heloise Purpura, MD;  Location: [facility];  Service: Urology;  Laterality: Right;   POSTERIOR LUMBAR FUSION 4 LEVEL N/A 07/28/2018   Procedure: Posterior Fixation Thoracic Ten-Lumbar Four with pedicle augmentaion with robotic assistance;  Surgeon: Barnett Abu, MD;  Location: [facility] OR;   Service: Neurosurgery;  Laterality: N/A;  Posterior Fixation Thoracic Ten-Lumbar Four with pedicle augmentaion with robotic assistance   REVERSE SHOULDER ARTHROPLASTY Right 07/21/2017   REVERSE SHOULDER ARTHROPLASTY Right 07/21/2017   Procedure: RIGHT REVERSE SHOULDER ARTHROPLASTY;  Surgeon: Jones Broom, MD;  Location: [facility] OR;  Service: Orthopedics;  Laterality: Right;   RIGHT FOOT DRAINAGE OF INFECTION     shoulder arthroscopy Right    X 2   TONSILLECTOMY     AND ADENOIDECTOMY    MEDICATIONS:  acetaminophen (TYLENOL) 500 MG tablet   amLODipine (NORVASC) 5 MG tablet   BD INSULIN SYRINGE U/F 31G X 5/16"  0.5 ML MISC   BD PEN NEEDLE MICRO U/F 32G X 6 MM MISC   Cholecalciferol (DIALYVITE VITAMIN D 5000) 125 MCG (5000 UT) capsule   clonazePAM (KLONOPIN) 0.5 MG tablet   CREON 36000-114000 units CPEP capsule   diclofenac (VOLTAREN) 75 MG EC tablet   doxazosin (CARDURA) 2 MG tablet   fenofibrate 54 MG tablet   FLUoxetine (PROZAC) 20 MG capsule   fluticasone (FLONASE) 50 MCG/ACT nasal spray   folic acid (FOLVITE) 1 MG tablet   FREESTYLE TEST STRIPS test strip   furosemide (LASIX) 40 MG tablet   gabapentin (NEURONTIN) 300 MG capsule   insulin aspart (NOVOLOG) 100 UNIT/ML FlexPen   Insulin Glargine (BASAGLAR KWIKPEN) 100 UNIT/ML   Lancets Misc. (UNISTIK 3 COMFORT) MISC   metoprolol tartrate (LOPRESSOR) 50 MG tablet   NON FORMULARY   nutrition supplement, JUVEN, (JUVEN) PACK   omeprazole (PRILOSEC) 20 MG capsule   ondansetron (ZOFRAN-ODT) 4 MG disintegrating tablet   oxybutynin (DITROPAN-XL) 10 MG 24 hr tablet   propylthiouracil (PTU) 50 MG tablet   rOPINIRole (REQUIP) 2 MG tablet   tiZANidine (ZANAFLEX) 4 MG tablet   VENTOLIN HFA 108 (90 Base) MCG/ACT inhaler   No current facility-administered medications for this encounter.   Marcille Blanco [facility] Short Stay/Anesthesiology [facility] Phone [phone] 02/24/2023 2:13 PM

## 2023-02-24 NOTE — Care Plan (Signed)
Ortho Bundle Case Management Note  Patient Details  Name: [name] MRN: [id number] Date of Birth: 1951-02-05  spoke with patient. states that she will discharge to home with family to assist. has Rolling walker. OPPT set up [facility] [address] discharge instructions discussed and questions answered.  Patient and MD in agreement with plan. Choice offered.                     DME Arranged:    DME Agency:     HH Arranged:    HH Agency:     Additional Comments: Please contact me with any questions of if this plan should need to change.  Shauna Hugh,  RN,BSN,MHA,CCM  [facility] Orthopaedic Specialist  [phone] 02/24/2023, 3:50 PM
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
## 2023-03-01 ENCOUNTER — Other Ambulatory Visit: Payer: Self-pay

## 2023-03-01 ENCOUNTER — Encounter (HOSPITAL_COMMUNITY): Payer: Self-pay | Admitting: Orthopaedic Surgery

## 2023-03-01 ENCOUNTER — Inpatient Hospital Stay (HOSPITAL_COMMUNITY)
Admission: RE | Admit: 2023-03-01 | Discharge: 2023-03-04 | DRG: 470 | Disposition: A | Discharge status: Home Health | Payer: Medicare Other | Attending: Orthopaedic Surgery | Admitting: Orthopaedic Surgery

## 2023-03-01 ENCOUNTER — Encounter (HOSPITAL_COMMUNITY)
Admission: RE | Disposition: A | Discharge status: Home Health | Payer: Self-pay | Source: Home / Self Care | Attending: Orthopaedic Surgery

## 2023-03-01 ENCOUNTER — Ambulatory Visit (HOSPITAL_COMMUNITY): Payer: Medicare Other | Admitting: Medical

## 2023-03-01 DIAGNOSIS — Z885 Allergy status to narcotic agent status: Secondary | ICD-10-CM

## 2023-03-01 DIAGNOSIS — K219 Gastro-esophageal reflux disease without esophagitis: Secondary | ICD-10-CM | POA: Diagnosis present

## 2023-03-01 DIAGNOSIS — I2510 Atherosclerotic heart disease of native coronary artery without angina pectoris: Secondary | ICD-10-CM | POA: Diagnosis present

## 2023-03-01 DIAGNOSIS — Z833 Family history of diabetes mellitus: Secondary | ICD-10-CM

## 2023-03-01 DIAGNOSIS — Z888 Allergy status to other drugs, medicaments and biological substances status: Secondary | ICD-10-CM

## 2023-03-01 DIAGNOSIS — Z96611 Presence of right artificial shoulder joint: Secondary | ICD-10-CM | POA: Diagnosis present

## 2023-03-01 DIAGNOSIS — D72819 Decreased white blood cell count, unspecified: Principal | ICD-10-CM

## 2023-03-01 DIAGNOSIS — I130 Hypertensive heart and chronic kidney disease with heart failure and stage 1 through stage 4 chronic kidney disease, or unspecified chronic kidney disease: Secondary | ICD-10-CM | POA: Diagnosis present

## 2023-03-01 DIAGNOSIS — E7800 Pure hypercholesterolemia, unspecified: Secondary | ICD-10-CM | POA: Diagnosis present

## 2023-03-01 DIAGNOSIS — E039 Hypothyroidism, unspecified: Secondary | ICD-10-CM | POA: Diagnosis present

## 2023-03-01 DIAGNOSIS — Z794 Long term (current) use of insulin: Secondary | ICD-10-CM | POA: Diagnosis not present

## 2023-03-01 DIAGNOSIS — I5032 Chronic diastolic (congestive) heart failure: Secondary | ICD-10-CM | POA: Diagnosis present

## 2023-03-01 DIAGNOSIS — Z8261 Family history of arthritis: Secondary | ICD-10-CM | POA: Diagnosis not present

## 2023-03-01 DIAGNOSIS — F419 Anxiety disorder, unspecified: Secondary | ICD-10-CM | POA: Diagnosis present

## 2023-03-01 DIAGNOSIS — Z9884 Bariatric surgery status: Secondary | ICD-10-CM

## 2023-03-01 DIAGNOSIS — M1712 Unilateral primary osteoarthritis, left knee: Secondary | ICD-10-CM | POA: Diagnosis not present

## 2023-03-01 DIAGNOSIS — G8918 Other acute postprocedural pain: Secondary | ICD-10-CM | POA: Diagnosis not present

## 2023-03-01 DIAGNOSIS — Z886 Allergy status to analgesic agent status: Secondary | ICD-10-CM | POA: Diagnosis not present

## 2023-03-01 DIAGNOSIS — Z961 Presence of intraocular lens: Secondary | ICD-10-CM | POA: Diagnosis present

## 2023-03-01 DIAGNOSIS — G8929 Other chronic pain: Secondary | ICD-10-CM | POA: Diagnosis present

## 2023-03-01 DIAGNOSIS — Z9841 Cataract extraction status, right eye: Secondary | ICD-10-CM

## 2023-03-01 DIAGNOSIS — E1122 Type 2 diabetes mellitus with diabetic chronic kidney disease: Secondary | ICD-10-CM | POA: Diagnosis present

## 2023-03-01 DIAGNOSIS — Z96652 Presence of left artificial knee joint: Secondary | ICD-10-CM

## 2023-03-01 DIAGNOSIS — Z9842 Cataract extraction status, left eye: Secondary | ICD-10-CM

## 2023-03-01 DIAGNOSIS — G4733 Obstructive sleep apnea (adult) (pediatric): Secondary | ICD-10-CM | POA: Diagnosis present

## 2023-03-01 DIAGNOSIS — Z882 Allergy status to sulfonamides status: Secondary | ICD-10-CM

## 2023-03-01 DIAGNOSIS — N1832 Chronic kidney disease, stage 3b: Secondary | ICD-10-CM | POA: Diagnosis present

## 2023-03-01 DIAGNOSIS — Z87442 Personal history of urinary calculi: Secondary | ICD-10-CM

## 2023-03-01 DIAGNOSIS — J4540 Moderate persistent asthma, uncomplicated: Secondary | ICD-10-CM | POA: Diagnosis present

## 2023-03-01 DIAGNOSIS — Z823 Family history of stroke: Secondary | ICD-10-CM

## 2023-03-01 DIAGNOSIS — D696 Thrombocytopenia, unspecified: Secondary | ICD-10-CM

## 2023-03-01 DIAGNOSIS — D62 Acute posthemorrhagic anemia: Secondary | ICD-10-CM | POA: Diagnosis not present

## 2023-03-01 DIAGNOSIS — M797 Fibromyalgia: Secondary | ICD-10-CM | POA: Diagnosis present

## 2023-03-01 DIAGNOSIS — Z6839 Body mass index (BMI) 39.0-39.9, adult: Secondary | ICD-10-CM | POA: Diagnosis not present

## 2023-03-01 DIAGNOSIS — G2581 Restless legs syndrome: Secondary | ICD-10-CM | POA: Diagnosis present

## 2023-03-01 DIAGNOSIS — E66812 Obesity, class 2: Secondary | ICD-10-CM | POA: Diagnosis present

## 2023-03-01 DIAGNOSIS — Z981 Arthrodesis status: Secondary | ICD-10-CM

## 2023-03-01 DIAGNOSIS — Z90710 Acquired absence of both cervix and uterus: Secondary | ICD-10-CM

## 2023-03-01 DIAGNOSIS — Z79899 Other long term (current) drug therapy: Secondary | ICD-10-CM

## 2023-03-01 DIAGNOSIS — Z8249 Family history of ischemic heart disease and other diseases of the circulatory system: Secondary | ICD-10-CM

## 2023-03-01 DIAGNOSIS — Z8052 Family history of malignant neoplasm of bladder: Secondary | ICD-10-CM

## 2023-03-01 DIAGNOSIS — Z96651 Presence of right artificial knee joint: Secondary | ICD-10-CM | POA: Diagnosis present

## 2023-03-01 DIAGNOSIS — Z9049 Acquired absence of other specified parts of digestive tract: Secondary | ICD-10-CM

## 2023-03-01 HISTORY — PX: TOTAL KNEE ARTHROPLASTY: SHX125

## 2023-03-01 LAB — CBC
HCT: 31.3 % — ABNORMAL LOW (ref 36.0–46.0)
Hemoglobin: 10.2 g/dL — ABNORMAL LOW (ref 12.0–15.0)
MCH: 29.6 pg (ref 26.0–34.0)
MCHC: 32.6 g/dL (ref 30.0–36.0)
MCV: 90.7 fL (ref 80.0–100.0)
Platelets: 108 10*3/uL — ABNORMAL LOW (ref 150–400)
RBC: 3.45 MIL/uL — ABNORMAL LOW (ref 3.87–5.11)
RDW: 14.5 % (ref 11.5–15.5)
WBC: 1.8 10*3/uL — ABNORMAL LOW (ref 4.0–10.5)
nRBC: 0 % (ref 0.0–0.2)

## 2023-03-01 LAB — GLUCOSE, CAPILLARY
Glucose-Capillary: 87 mg/dL (ref 70–99)
Glucose-Capillary: 85 mg/dL (ref 70–99)
Glucose-Capillary: 92 mg/dL (ref 70–99)
Glucose-Capillary: 166 mg/dL — ABNORMAL HIGH (ref 70–99)
Glucose-Capillary: 234 mg/dL — ABNORMAL HIGH (ref 70–99)

## 2023-03-01 SURGERY — ARTHROPLASTY, KNEE, TOTAL
Anesthesia: Monitor Anesthesia Care | Site: Knee | Laterality: Left

## 2023-03-01 MED ORDER — DOCUSATE SODIUM 100 MG PO CAPS
100.0000 mg | ORAL_CAPSULE | Freq: Two times a day (BID) | ORAL | Status: DC
  Administered 2023-03-01 – 2023-03-04 (×6): 100 mg via ORAL
  Filled 2023-03-01 (×6): qty 1

## 2023-03-01 MED ORDER — DEXMEDETOMIDINE HCL IN NACL 80 MCG/20ML IV SOLN
INTRAVENOUS | Status: DC | PRN
  Administered 2023-03-01 (×2): 4 ug via INTRAVENOUS

## 2023-03-01 MED ORDER — INSULIN ASPART 100 UNIT/ML IJ SOLN
0.0000 [IU] | Freq: Three times a day (TID) | INTRAMUSCULAR | Status: DC
  Administered 2023-03-01: 4 [IU] via SUBCUTANEOUS
  Administered 2023-03-02 (×2): 3 [IU] via SUBCUTANEOUS
  Administered 2023-03-03: 4 [IU] via SUBCUTANEOUS
  Administered 2023-03-03: 3 [IU] via SUBCUTANEOUS
  Administered 2023-03-04: 4 [IU] via SUBCUTANEOUS

## 2023-03-01 MED ORDER — CLONAZEPAM 0.5 MG PO TABS: 0.5000 mg | ORAL_TABLET | Freq: Two times a day (BID) | ORAL | Status: DC | PRN

## 2023-03-01 MED ORDER — SODIUM CHLORIDE (PF) 0.9 % IJ SOLN
INTRAMUSCULAR | Status: AC
  Filled 2023-03-01: qty 50

## 2023-03-01 MED ORDER — FENOFIBRATE 54 MG PO TABS
54.0000 mg | ORAL_TABLET | Freq: Every day | ORAL | Status: DC
Start: 2023-03-01 — End: 2023-03-04
  Administered 2023-03-01 – 2023-03-03 (×3): 54 mg via ORAL
  Filled 2023-03-01 (×3): qty 1

## 2023-03-01 MED ORDER — FLUOXETINE HCL 20 MG PO CAPS
40.0000 mg | ORAL_CAPSULE | Freq: Every day | ORAL | Status: DC
  Administered 2023-03-01 – 2023-03-03 (×3): 40 mg via ORAL
  Filled 2023-03-01 (×3): qty 2

## 2023-03-01 MED ORDER — LACTATED RINGERS IV SOLN: INTRAVENOUS | Status: DC

## 2023-03-01 MED ORDER — ASPIRIN 81 MG PO CHEW
81.0000 mg | CHEWABLE_TABLET | Freq: Two times a day (BID) | ORAL | Status: DC
  Administered 2023-03-02 – 2023-03-04 (×5): 81 mg via ORAL
  Filled 2023-03-01 (×5): qty 1

## 2023-03-01 MED ORDER — FENTANYL CITRATE (PF) 100 MCG/2ML IJ SOLN
INTRAMUSCULAR | Status: AC
  Filled 2023-03-01: qty 2

## 2023-03-01 MED ORDER — FENTANYL CITRATE PF 50 MCG/ML IJ SOSY
50.0000 ug | PREFILLED_SYRINGE | INTRAMUSCULAR | Status: DC
  Administered 2023-03-01: 50 ug via INTRAVENOUS
  Filled 2023-03-01: qty 2

## 2023-03-01 MED ORDER — PHENOL 1.4 % MT LIQD: 1.0000 | OROMUCOSAL | Status: DC | PRN

## 2023-03-01 MED ORDER — TRANEXAMIC ACID 1000 MG/10ML IV SOLN
INTRAVENOUS | Status: DC | PRN
  Administered 2023-03-01: 2000 mg via TOPICAL

## 2023-03-01 MED ORDER — FENTANYL CITRATE PF 50 MCG/ML IJ SOSY
PREFILLED_SYRINGE | INTRAMUSCULAR | Status: AC
  Filled 2023-03-01: qty 2

## 2023-03-01 MED ORDER — OXYCODONE HCL 5 MG PO TABS
10.0000 mg | ORAL_TABLET | ORAL | Status: DC | PRN
  Administered 2023-03-01 – 2023-03-03 (×7): 10 mg via ORAL
  Filled 2023-03-01 (×3): qty 2

## 2023-03-01 MED ORDER — LACTATED RINGERS IV SOLN: INTRAVENOUS | Status: DC | PRN

## 2023-03-01 MED ORDER — METHOCARBAMOL 500 MG PO TABS
500.0000 mg | ORAL_TABLET | Freq: Four times a day (QID) | ORAL | Status: DC | PRN
  Administered 2023-03-01 – 2023-03-03 (×6): 500 mg via ORAL
  Filled 2023-03-01 (×5): qty 1

## 2023-03-01 MED ORDER — GABAPENTIN 300 MG PO CAPS
300.0000 mg | ORAL_CAPSULE | Freq: Three times a day (TID) | ORAL | Status: DC
  Administered 2023-03-01 – 2023-03-04 (×9): 300 mg via ORAL
  Filled 2023-03-01 (×9): qty 1

## 2023-03-01 MED ORDER — DIPHENHYDRAMINE HCL 12.5 MG/5ML PO ELIX: 12.5000 mg | ORAL_SOLUTION | ORAL | Status: DC | PRN

## 2023-03-01 MED ORDER — OXYCODONE HCL 5 MG PO TABS
5.0000 mg | ORAL_TABLET | ORAL | Status: DC | PRN
  Administered 2023-03-01: 5 mg via ORAL
  Administered 2023-03-02 (×2): 10 mg via ORAL
  Filled 2023-03-01 (×7): qty 2

## 2023-03-01 MED ORDER — FENTANYL CITRATE PF 50 MCG/ML IJ SOSY
25.0000 ug | PREFILLED_SYRINGE | INTRAMUSCULAR | Status: DC | PRN
  Administered 2023-03-01 (×2): 50 ug via INTRAVENOUS

## 2023-03-01 MED ORDER — MENTHOL 3 MG MT LOZG: 1.0000 | LOZENGE | OROMUCOSAL | Status: DC | PRN

## 2023-03-01 MED ORDER — FOLIC ACID 1 MG PO TABS
1.0000 mg | ORAL_TABLET | Freq: Every day | ORAL | Status: DC
  Administered 2023-03-01 – 2023-03-04 (×4): 1 mg via ORAL
  Filled 2023-03-01 (×4): qty 1

## 2023-03-01 MED ORDER — ACETAMINOPHEN 500 MG PO TABS
ORAL_TABLET | ORAL | Status: AC
  Filled 2023-03-01: qty 2

## 2023-03-01 MED ORDER — PROPOFOL 1000 MG/100ML IV EMUL
INTRAVENOUS | Status: AC
  Filled 2023-03-01: qty 100

## 2023-03-01 MED ORDER — ROPIVACAINE HCL 5 MG/ML IJ SOLN
INTRAMUSCULAR | Status: DC | PRN
  Administered 2023-03-01: 20 mL via PERINEURAL

## 2023-03-01 MED ORDER — BUPIVACAINE LIPOSOME 1.3 % IJ SUSP: 20.0000 mL | Freq: Once | INTRAMUSCULAR | Status: DC

## 2023-03-01 MED ORDER — SODIUM CHLORIDE 0.9% IV SOLUTION
INTRAVENOUS | Status: AC | PRN
  Administered 2023-03-01: 3000 mL

## 2023-03-01 MED ORDER — CLONAZEPAM 0.5 MG PO TABS
0.5000 mg | ORAL_TABLET | Freq: Every day | ORAL | Status: DC
  Administered 2023-03-01 – 2023-03-03 (×3): 0.5 mg via ORAL
  Filled 2023-03-01 (×3): qty 1

## 2023-03-01 MED ORDER — DEXAMETHASONE SODIUM PHOSPHATE 10 MG/ML IJ SOLN
INTRAMUSCULAR | Status: DC | PRN
  Administered 2023-03-01: 4 mg via INTRAVENOUS

## 2023-03-01 MED ORDER — METOCLOPRAMIDE HCL 5 MG PO TABS
5.0000 mg | ORAL_TABLET | Freq: Three times a day (TID) | ORAL | Status: DC | PRN
Start: 2023-03-01 — End: 2023-03-04

## 2023-03-01 MED ORDER — AMLODIPINE BESYLATE 5 MG PO TABS
5.0000 mg | ORAL_TABLET | Freq: Every day | ORAL | Status: DC
  Administered 2023-03-01 – 2023-03-03 (×3): 5 mg via ORAL
  Filled 2023-03-01 (×3): qty 1

## 2023-03-01 MED ORDER — AMISULPRIDE (ANTIEMETIC) 5 MG/2ML IV SOLN: 10.0000 mg | Freq: Once | INTRAVENOUS | Status: DC | PRN

## 2023-03-01 MED ORDER — PROPYLTHIOURACIL 50 MG PO TABS
50.0000 mg | ORAL_TABLET | Freq: Every day | ORAL | Status: DC
  Administered 2023-03-02 – 2023-03-04 (×3): 50 mg via ORAL
  Filled 2023-03-01 (×3): qty 1

## 2023-03-01 MED ORDER — POVIDONE-IODINE 10 % EX SWAB: 2.0000 | Freq: Once | CUTANEOUS | Status: DC

## 2023-03-01 MED ORDER — TRANEXAMIC ACID 1000 MG/10ML IV SOLN
2000.0000 mg | INTRAVENOUS | Status: DC
  Filled 2023-03-01: qty 20

## 2023-03-01 MED ORDER — PHENYLEPHRINE HCL-NACL 20-0.9 MG/250ML-% IV SOLN
INTRAVENOUS | Status: DC | PRN
  Administered 2023-03-01: 30 ug/min via INTRAVENOUS

## 2023-03-01 MED ORDER — ONDANSETRON HCL 4 MG/2ML IJ SOLN
INTRAMUSCULAR | Status: DC | PRN
  Administered 2023-03-01: 4 mg via INTRAVENOUS

## 2023-03-01 MED ORDER — BUPIVACAINE LIPOSOME 1.3 % IJ SUSP
INTRAMUSCULAR | Status: AC
  Filled 2023-03-01: qty 20

## 2023-03-01 MED ORDER — TRANEXAMIC ACID-NACL 1000-0.7 MG/100ML-% IV SOLN
1000.0000 mg | INTRAVENOUS | Status: AC
  Administered 2023-03-01: 1000 mg via INTRAVENOUS
  Filled 2023-03-01: qty 100

## 2023-03-01 MED ORDER — ALUM & MAG HYDROXIDE-SIMETH 200-200-20 MG/5ML PO SUSP: 30.0000 mL | ORAL | Status: DC | PRN

## 2023-03-01 MED ORDER — KETOROLAC TROMETHAMINE 15 MG/ML IJ SOLN
7.5000 mg | Freq: Four times a day (QID) | INTRAMUSCULAR | Status: AC
  Administered 2023-03-01 – 2023-03-02 (×4): 7.5 mg via INTRAVENOUS
  Filled 2023-03-01 (×4): qty 1

## 2023-03-01 MED ORDER — SODIUM CHLORIDE (PF) 0.9 % IJ SOLN
INTRAMUSCULAR | Status: DC | PRN
  Administered 2023-03-01: 30 mL

## 2023-03-01 MED ORDER — ROPINIROLE HCL 1 MG PO TABS
2.0000 mg | ORAL_TABLET | Freq: Every day | ORAL | Status: DC
Start: 2023-03-02 — End: 2023-03-04
  Administered 2023-03-02 – 2023-03-04 (×3): 2 mg via ORAL
  Filled 2023-03-01 (×3): qty 2

## 2023-03-01 MED ORDER — ALBUTEROL SULFATE HFA 108 (90 BASE) MCG/ACT IN AERS: 2.0000 | INHALATION_SPRAY | RESPIRATORY_TRACT | Status: DC | PRN

## 2023-03-01 MED ORDER — ALBUTEROL SULFATE (2.5 MG/3ML) 0.083% IN NEBU
2.5000 mg | INHALATION_SOLUTION | RESPIRATORY_TRACT | Status: DC | PRN
  Administered 2023-03-02: 2.5 mg via RESPIRATORY_TRACT
  Filled 2023-03-01: qty 3

## 2023-03-01 MED ORDER — DEXAMETHASONE SODIUM PHOSPHATE 10 MG/ML IJ SOLN
INTRAMUSCULAR | Status: AC
  Filled 2023-03-01: qty 1

## 2023-03-01 MED ORDER — INSULIN ASPART 100 UNIT/ML IJ SOLN: 0.0000 [IU] | INTRAMUSCULAR | Status: DC | PRN

## 2023-03-01 MED ORDER — FENTANYL CITRATE PF 50 MCG/ML IJ SOSY
PREFILLED_SYRINGE | INTRAMUSCULAR | Status: AC
  Filled 2023-03-01: qty 1

## 2023-03-01 MED ORDER — METHOCARBAMOL 500 MG PO TABS
ORAL_TABLET | ORAL | Status: AC
  Filled 2023-03-01: qty 1

## 2023-03-01 MED ORDER — MUPIROCIN CALCIUM 2 % EX CREA
TOPICAL_CREAM | Freq: Every day | CUTANEOUS | Status: DC
  Administered 2023-03-03: 1 via TOPICAL
  Filled 2023-03-01: qty 15

## 2023-03-01 MED ORDER — METHOCARBAMOL 1000 MG/10ML IJ SOLN: 500.0000 mg | Freq: Four times a day (QID) | INTRAMUSCULAR | Status: DC | PRN

## 2023-03-01 MED ORDER — ORAL CARE MOUTH RINSE: 15.0000 mL | Freq: Once | OROMUCOSAL | Status: AC

## 2023-03-01 MED ORDER — ONDANSETRON HCL 4 MG/2ML IJ SOLN
INTRAMUSCULAR | Status: AC
  Filled 2023-03-01: qty 2

## 2023-03-01 MED ORDER — BUPIVACAINE-EPINEPHRINE 0.5% -1:200000 IJ SOLN
INTRAMUSCULAR | Status: DC | PRN
  Administered 2023-03-01: 30 mL

## 2023-03-01 MED ORDER — CHLORHEXIDINE GLUCONATE 0.12 % MT SOLN
15.0000 mL | Freq: Once | OROMUCOSAL | Status: AC
  Administered 2023-03-01: 15 mL via OROMUCOSAL

## 2023-03-01 MED ORDER — PROPOFOL 500 MG/50ML IV EMUL
INTRAVENOUS | Status: DC | PRN
  Administered 2023-03-01: 50 ug/kg/min via INTRAVENOUS
  Administered 2023-03-01 (×2): 10 mg via INTRAVENOUS

## 2023-03-01 MED ORDER — CLONIDINE HCL (ANALGESIA) 100 MCG/ML EP SOLN
EPIDURAL | Status: DC | PRN
  Administered 2023-03-01: 50 ug

## 2023-03-01 MED ORDER — METOPROLOL TARTRATE 50 MG PO TABS
50.0000 mg | ORAL_TABLET | Freq: Two times a day (BID) | ORAL | Status: DC
  Administered 2023-03-01 – 2023-03-04 (×6): 50 mg via ORAL
  Filled 2023-03-01 (×6): qty 1

## 2023-03-01 MED ORDER — FENTANYL CITRATE (PF) 100 MCG/2ML IJ SOLN
INTRAMUSCULAR | Status: DC | PRN
  Administered 2023-03-01: 12.5 ug via INTRAVENOUS
  Administered 2023-03-01: 25 ug via INTRAVENOUS
  Administered 2023-03-01: 12.5 ug via INTRAVENOUS
  Administered 2023-03-01: 50 ug via INTRAVENOUS

## 2023-03-01 MED ORDER — 0.9 % SODIUM CHLORIDE (POUR BTL) OPTIME
TOPICAL | Status: DC | PRN
  Administered 2023-03-01: 1000 mL

## 2023-03-01 MED ORDER — BUPIVACAINE LIPOSOME 1.3 % IJ SUSP
INTRAMUSCULAR | Status: DC | PRN
  Administered 2023-03-01: 20 mL

## 2023-03-01 MED ORDER — ACETAMINOPHEN 325 MG PO TABS
325.0000 mg | ORAL_TABLET | Freq: Four times a day (QID) | ORAL | Status: DC | PRN
Start: 2023-03-02 — End: 2023-03-04
  Administered 2023-03-03 – 2023-03-04 (×2): 650 mg via ORAL
  Filled 2023-03-01 (×2): qty 2

## 2023-03-01 MED ORDER — METOCLOPRAMIDE HCL 5 MG/ML IJ SOLN: 5.0000 mg | Freq: Three times a day (TID) | INTRAMUSCULAR | Status: DC | PRN

## 2023-03-01 MED ORDER — FUROSEMIDE 40 MG PO TABS
40.0000 mg | ORAL_TABLET | Freq: Every day | ORAL | Status: DC
  Administered 2023-03-01 – 2023-03-04 (×2): 40 mg via ORAL
  Filled 2023-03-01 (×4): qty 1

## 2023-03-01 MED ORDER — ONDANSETRON HCL 4 MG PO TABS
4.0000 mg | ORAL_TABLET | Freq: Four times a day (QID) | ORAL | Status: DC | PRN
  Administered 2023-03-02 – 2023-03-04 (×3): 4 mg via ORAL
  Filled 2023-03-01 (×3): qty 1

## 2023-03-01 MED ORDER — BISACODYL 5 MG PO TBEC: 5.0000 mg | DELAYED_RELEASE_TABLET | Freq: Every day | ORAL | Status: DC | PRN

## 2023-03-01 MED ORDER — SODIUM CHLORIDE 0.9 % IV SOLN: INTRAVENOUS | Status: AC

## 2023-03-01 MED ORDER — PANCRELIPASE (LIP-PROT-AMYL) 12000-38000 UNITS PO CPEP
36000.0000 [IU] | ORAL_CAPSULE | Freq: Three times a day (TID) | ORAL | Status: DC
  Administered 2023-03-01 – 2023-03-04 (×7): 36000 [IU] via ORAL
  Filled 2023-03-01 (×7): qty 3

## 2023-03-01 MED ORDER — ACETAMINOPHEN 500 MG PO TABS
1000.0000 mg | ORAL_TABLET | Freq: Four times a day (QID) | ORAL | Status: AC
  Administered 2023-03-01 – 2023-03-02 (×3): 1000 mg via ORAL
  Filled 2023-03-01 (×3): qty 2

## 2023-03-01 MED ORDER — PANTOPRAZOLE SODIUM 40 MG PO TBEC
40.0000 mg | DELAYED_RELEASE_TABLET | Freq: Every day | ORAL | Status: DC
  Administered 2023-03-01 – 2023-03-04 (×4): 40 mg via ORAL
  Filled 2023-03-01 (×4): qty 1

## 2023-03-01 MED ORDER — LIDOCAINE HCL (CARDIAC) PF 100 MG/5ML IV SOSY
PREFILLED_SYRINGE | INTRAVENOUS | Status: DC | PRN
  Administered 2023-03-01: 50 mg via INTRAVENOUS

## 2023-03-01 MED ORDER — FLUTICASONE PROPIONATE 50 MCG/ACT NA SUSP: 2.0000 | Freq: Every day | NASAL | Status: DC | PRN

## 2023-03-01 MED ORDER — ROPINIROLE HCL 1 MG PO TABS
3.0000 mg | ORAL_TABLET | Freq: Every day | ORAL | Status: DC
  Administered 2023-03-01 – 2023-03-03 (×3): 3 mg via ORAL
  Filled 2023-03-01 (×3): qty 3

## 2023-03-01 MED ORDER — OXYCODONE HCL 5 MG PO TABS
ORAL_TABLET | ORAL | Status: AC
  Filled 2023-03-01: qty 1

## 2023-03-01 MED ORDER — DOXAZOSIN MESYLATE 4 MG PO TABS
2.0000 mg | ORAL_TABLET | Freq: Every day | ORAL | Status: DC
  Administered 2023-03-03 – 2023-03-04 (×2): 2 mg via ORAL
  Filled 2023-03-01 (×3): qty 1

## 2023-03-01 MED ORDER — OXYBUTYNIN CHLORIDE ER 5 MG PO TB24
10.0000 mg | ORAL_TABLET | Freq: Every day | ORAL | Status: DC
  Administered 2023-03-02 – 2023-03-04 (×3): 10 mg via ORAL
  Filled 2023-03-01 (×4): qty 2

## 2023-03-01 MED ORDER — BUPIVACAINE IN DEXTROSE 0.75-8.25 % IT SOLN
INTRATHECAL | Status: DC | PRN
  Administered 2023-03-01: 1.6 mL via INTRATHECAL

## 2023-03-01 MED ORDER — TRANEXAMIC ACID-NACL 1000-0.7 MG/100ML-% IV SOLN
1000.0000 mg | Freq: Once | INTRAVENOUS | Status: AC
  Administered 2023-03-01: 1000 mg via INTRAVENOUS
  Filled 2023-03-01: qty 100

## 2023-03-01 MED ORDER — CEFAZOLIN SODIUM-DEXTROSE 2-4 GM/100ML-% IV SOLN
2.0000 g | INTRAVENOUS | Status: AC
  Administered 2023-03-01: 2 g via INTRAVENOUS
  Filled 2023-03-01: qty 100

## 2023-03-01 MED ORDER — PHENYLEPHRINE HCL-NACL 20-0.9 MG/250ML-% IV SOLN
INTRAVENOUS | Status: AC
  Filled 2023-03-01: qty 250

## 2023-03-01 MED ORDER — ONDANSETRON HCL 4 MG/2ML IJ SOLN
4.0000 mg | Freq: Four times a day (QID) | INTRAMUSCULAR | Status: DC | PRN
  Administered 2023-03-01 – 2023-03-02 (×4): 4 mg via INTRAVENOUS
  Filled 2023-03-01 (×3): qty 2

## 2023-03-01 MED ORDER — LIDOCAINE HCL (PF) 2 % IJ SOLN
INTRAMUSCULAR | Status: AC
  Filled 2023-03-01: qty 5

## 2023-03-01 MED ORDER — HYDROMORPHONE HCL 1 MG/ML IJ SOLN: 0.5000 mg | INTRAMUSCULAR | Status: DC | PRN

## 2023-03-01 MED ORDER — LACTATED RINGERS IV SOLN: INTRAVENOUS | Status: AC

## 2023-03-01 MED ORDER — MIDAZOLAM HCL 2 MG/2ML IJ SOLN: 1.0000 mg | INTRAMUSCULAR | Status: DC

## 2023-03-01 SURGICAL SUPPLY — 54 items
ATTUNE PS FEM LT SZ 3 CEM KNEE (Femur) IMPLANT
ATTUNE PSRP INSR SZ3 7 KNEE (Insert) IMPLANT
BAG COUNTER SPONGE SURGICOUNT (BAG) ×1 IMPLANT
BAG DECANTER FOR FLEXI CONT (MISCELLANEOUS) ×1 IMPLANT
BAG ZIPLOCK 12X15 (MISCELLANEOUS) ×1 IMPLANT
BASE TIBIAL ROT PLAT SZ 2 KNEE (Miscellaneous) IMPLANT
BLADE SAGITTAL 25.0X1.19X90 (BLADE) ×1 IMPLANT
BLADE SAW SGTL 11.0X1.19X90.0M (BLADE) ×1 IMPLANT
BLADE SURG SZ10 CARB STEEL (BLADE) ×1 IMPLANT
BNDG ELASTIC 6INX 5YD STR LF (GAUZE/BANDAGES/DRESSINGS) ×1 IMPLANT
BNDG ELASTIC 6X10 VLCR STRL LF (GAUZE/BANDAGES/DRESSINGS) IMPLANT
BOOTIES KNEE HIGH SLOAN (MISCELLANEOUS) ×1 IMPLANT
BOWL SMART MIX CTS (DISPOSABLE) ×1 IMPLANT
CEMENT HV SMART SET (Cement) ×2 IMPLANT
COVER SURGICAL LIGHT HANDLE (MISCELLANEOUS) ×1 IMPLANT
CUFF TRNQT CYL 24X4X16.5-23 (TOURNIQUET CUFF) IMPLANT
CUFF TRNQT CYL 34X4.125X (TOURNIQUET CUFF) ×1 IMPLANT
DRAPE TOP 10253 STERILE (DRAPES) ×1 IMPLANT
DRAPE U-SHAPE 47X51 STRL (DRAPES) ×1 IMPLANT
DRSG AQUACEL AG ADV 3.5X10 (GAUZE/BANDAGES/DRESSINGS) ×1 IMPLANT
DURAPREP 26ML APPLICATOR (WOUND CARE) ×2 IMPLANT
ELECT REM PT RETURN 15FT ADLT (MISCELLANEOUS) ×1 IMPLANT
GAUZE PAD ABD 7.5X8 STRL (GAUZE/BANDAGES/DRESSINGS) IMPLANT
GLOVE BIO SURGEON STRL SZ8 (GLOVE) ×2 IMPLANT
GLOVE BIOGEL PI IND STRL 7.0 (GLOVE) ×1 IMPLANT
GLOVE BIOGEL PI IND STRL 8 (GLOVE) ×2 IMPLANT
GLOVE SURG SYN 7.0 (GLOVE) ×1 IMPLANT
GLOVE SURG SYN 7.0 PF PI (GLOVE) ×1 IMPLANT
GOWN SRG XL LVL 4 BRTHBL STRL (GOWNS) ×1 IMPLANT
GOWN STRL REUS W/ TWL XL LVL3 (GOWN DISPOSABLE) ×2 IMPLANT
HOLDER FOLEY CATH W/STRAP (MISCELLANEOUS) IMPLANT
HOOD PEEL AWAY T7 (MISCELLANEOUS) ×3 IMPLANT
KIT TURNOVER KIT A (KITS) IMPLANT
MANIFOLD NEPTUNE II (INSTRUMENTS) ×1 IMPLANT
NS IRRIG 1000ML POUR BTL (IV SOLUTION) ×1 IMPLANT
PACK TOTAL KNEE CUSTOM (KITS) ×1 IMPLANT
PAD ARMBOARD 7.5X6 YLW CONV (MISCELLANEOUS) ×1 IMPLANT
PATELLA MEDIAL ATTUN 35MM KNEE (Knees) IMPLANT
PIN STEINMAN FIXATION KNEE (PIN) IMPLANT
PROTECTOR NERVE ULNAR (MISCELLANEOUS) ×1 IMPLANT
SET HNDPC FAN SPRY TIP SCT (DISPOSABLE) ×1 IMPLANT
SPIKE FLUID TRANSFER (MISCELLANEOUS) ×2 IMPLANT
STRIP CLOSURE SKIN 1/2X4 (GAUZE/BANDAGES/DRESSINGS) IMPLANT
SUT ETHIBOND NAB CT1 #1 30IN (SUTURE) ×1 IMPLANT
SUT STRATAFIX 0 PDS 27 VIOLET (SUTURE) ×1
SUT VIC AB 0 CT1 36 (SUTURE) ×1 IMPLANT
SUT VIC AB 2-0 CT1 TAPERPNT 27 (SUTURE) ×1 IMPLANT
SUT VICRYL AB 3-0 FS1 BRD 27IN (SUTURE) ×1 IMPLANT
SUTURE STRATFX 0 PDS 27 VIOLET (SUTURE) ×1 IMPLANT
TIBIAL BASE ROT PLAT SZ 2 KNEE (Miscellaneous) ×1 IMPLANT
TRAY FOLEY MTR SLVR 16FR STAT (SET/KITS/TRAYS/PACK) IMPLANT
WATER STERILE IRR 1000ML POUR (IV SOLUTION) ×2 IMPLANT
WRAP KNEE MAXI GEL POST OP (GAUZE/BANDAGES/DRESSINGS) ×1 IMPLANT
YANKAUER SUCT BULB TIP NO VENT (SUCTIONS) ×1 IMPLANT

## 2023-03-01 NOTE — Plan of Care (Signed)
  Problem: Education: Goal: Knowledge of General Education information will improve Description: Including pain rating scale, medication(s)/side effects and non-pharmacologic comfort measures Outcome: Progressing   Problem: Health Behavior/Discharge Planning: Goal: Ability to manage health-related needs will improve Outcome: Progressing   Problem: Clinical Measurements: Goal: Ability to maintain clinical measurements within normal limits will improve Outcome: Progressing Goal: Will remain free from infection Outcome: Progressing Goal: Diagnostic test results will improve Outcome: Progressing Goal: Respiratory complications will improve Outcome: Progressing Goal: Cardiovascular complication will be avoided Outcome: Progressing   Problem: Activity: Goal: Risk for activity intolerance will decrease Outcome: Progressing   Problem: Nutrition: Goal: Adequate nutrition will be maintained Outcome: Completed/Met   Problem: Coping: Goal: Level of anxiety will decrease Outcome: Progressing   Problem: Elimination: Goal: Will not experience complications related to bowel motility Outcome: Progressing Goal: Will not experience complications related to urinary retention Outcome: Progressing   Problem: Pain Management: Goal: General experience of comfort will improve Outcome: Progressing   Problem: Safety: Goal: Ability to remain free from injury will improve Outcome: Progressing   Problem: Skin Integrity: Goal: Risk for impaired skin integrity will decrease Outcome: Progressing   Problem: Education: Goal: Knowledge of General Education information will improve Description: Including pain rating scale, medication(s)/side effects and non-pharmacologic comfort measures Outcome: Progressing   Problem: Health Behavior/Discharge Planning: Goal: Ability to manage health-related needs will improve Outcome: Progressing   Problem: Clinical Measurements: Goal: Ability to maintain  clinical measurements within normal limits will improve Outcome: Progressing   Problem: Nutritional: Goal: Maintenance of adequate nutrition will improve Outcome: Completed/Met   Problem: Skin Integrity: Goal: Risk for impaired skin integrity will decrease Outcome: Adequate for Discharge   Problem: Tissue Perfusion: Goal: Adequacy of tissue perfusion will improve Outcome: Progressing   Problem: Education: Goal: Knowledge of the prescribed therapeutic regimen will improve Outcome: Progressing Goal: Individualized Educational Video(s) Outcome: Completed/Met   Problem: Activity: Goal: Ability to avoid complications of mobility impairment will improve Outcome: Progressing Goal: Range of joint motion will improve Outcome: Adequate for Discharge

## 2023-03-01 NOTE — Plan of Care (Signed)

## 2023-03-01 NOTE — Anesthesia Procedure Notes (Signed)
Spinal  Patient location during procedure: OR Start time: 03/01/2023 10:20 AM End time: 03/01/2023 10:26 AM Reason for block: surgical anesthesia Staffing Performed: anesthesiologist  Anesthesiologist: Marcene Duos, MD Performed by: Marcene Duos, MD Authorized by: Marcene Duos, MD   Preanesthetic Checklist Completed: patient identified, IV checked, site marked, risks and benefits discussed, surgical consent, monitors and equipment checked, pre-op evaluation and timeout performed Spinal Block Patient position: sitting Prep: DuraPrep Patient monitoring: heart rate, cardiac monitor, continuous pulse ox and blood pressure Approach: midline Location: L4-5 Injection technique: single-shot Needle Needle type: Pencan  Needle gauge: 24 G Needle length: 9 cm Assessment Sensory level: T4 Events: CSF return

## 2023-03-01 NOTE — Anesthesia Procedure Notes (Signed)
Anesthesia Regional Block: Adductor canal block   Pre-Anesthetic Checklist: , timeout performed,  Correct Patient, Correct Site, Correct Laterality,  Correct Procedure, Correct Position, site marked,  Risks and benefits discussed,  Surgical consent,  Pre-op evaluation,  At surgeon's request and post-op pain management  Laterality: Left  Prep: chloraprep       Needles:  Injection technique: Single-shot  Needle Type: Echogenic Needle     Needle Length: 9cm  Needle Gauge: 21     Additional Needles:   Procedures:,,,, ultrasound used (permanent image in chart),,    Narrative:  Start time: 03/01/2023 9:25 AM End time: 03/01/2023 9:30 AM Injection made incrementally with aspirations every 5 mL.  Performed by: Personally  Anesthesiologist: Marcene Duos, MD

## 2023-03-01 NOTE — Op Note (Signed)
PREOP DIAGNOSIS: DJD LEFT KNEE POSTOP DIAGNOSIS:  same PROCEDURE: LEFT TKR ANESTHESIA: Spinal and MAC ATTENDING SURGEON: [name] ASSISTANT: Elodia Florence PA  INDICATIONS FOR PROCEDURE: [name] is a 72 y.o. female who has struggled for a long time with pain due to degenerative arthritis of the left knee.  The patient has failed many conservative non-operative measures and at this point has pain which limits the ability to sleep and walk.  The patient is offered total knee replacement.  Informed operative consent was obtained after discussion of possible risks of anesthesia, infection, neurovascular injury, DVT, and death.  The importance of the post-operative rehabilitation protocol to optimize result was stressed extensively with the patient.  SUMMARY OF FINDINGS AND PROCEDURE:  [name] was taken to the operative suite where under the above anesthesia a left knee replacement was performed.  There were advanced degenerative changes and the bone quality was poor.  We used the DePuyAttune system and placed size 3 femur, 2 tibia, 35 mm all polyethylene patella, and a size 7 mm spacer.  Elodia Florence PA-C assisted throughout and was invaluable to the completion of the case in that he helped retract and maintain exposure while I placed the components.  He also helped close thereby minimizing OR time.  The patient was admitted for appropriate post-op care to include perioperative antibiotics and mechanical and pharmacologic measures for DVT prophylaxis.  DESCRIPTION OF PROCEDURE:  [name] was taken to the operative suite where the above anesthesia was applied.  The patient was positioned supine and prepped and draped in normal sterile fashion.  An appropriate time out was performed.  After the administration of kefzol pre-op antibiotic the leg was elevated and exsanguinated and a tourniquet inflated.  A standard longitudinal incision was made on the anterior knee.  Dissection was  carried down to the extensor mechanism.  All appropriate anti-infective measures were used including the pre-operative antibiotic, betadine impregnated drape, and closed hooded exhaust systems for each member of the surgical team.  A medial parapatellar incision was made in the extensor mechanism and the knee cap flipped and the knee flexed.  Some residual meniscal tissues were removed along with any remaining ACL/PCL tissue.  A guide was placed on the tibia and a flat cut was made on it's superior surface.  An intramedullary guide was placed in the femur and was utilized to make anterior and posterior cuts creating an appropriate flexion gap.  A second intramedullary guide was placed in the femur to make a distal cut properly balancing the knee with an extension gap equal to the flexion gap.  The three bones sized to the above mentioned sizes and the appropriate guides were placed and utilized.  A trial reduction was done and the knee easily came to full extension and the patella tracked well on flexion.  The trial components were removed and all bones were cleaned with pulsatile lavage and then dried thoroughly.  Cement was mixed and was pressurized onto the bones followed by placement of the aforementioned components.  Excess cement was trimmed and pressure was held on the components until the cement had hardened.  The tourniquet was deflated and a small amount of bleeding was controlled with cautery and pressure.  The knee was irrigated thoroughly.  The extensor mechanism was re-approximated with #1 ethibond in interrupted fashion.  The knee was flexed and the repair was solid.  The subcutaneous tissues were re-approximated with #0 and #2-0 vicryl and the skin  closed with a subcuticular stitch and steristrips.  A sterile dressing was applied.  Intraoperative fluids, EBL, and tourniquet time can be obtained from anesthesia records.  DISPOSITION:  The patient was taken to recovery room in stable condition and  scheduled to potentially go home same day depending on ability to walk and tolerate liquids.  [name] 03/01/2023, 12:05 PM

## 2023-03-01 NOTE — Interval H&P Note (Signed)
History and Physical Interval Note:  03/01/2023 9:10 AM  [name]  has presented today for surgery, with the diagnosis of LEFT KNEE DEGENERATIVE JOINT DISEASE.  The various methods of treatment have been discussed with the patient and family. After consideration of risks, benefits and other options for treatment, the patient has consented to  Procedure(s): LEFT KNEE TOTAL KNEE ARTHROPLASTY (Left) as a surgical intervention.  The patient's history has been reviewed, patient examined, no change in status, stable for surgery.  I have reviewed the patient's chart and labs.  Questions were answered to the patient's satisfaction.     [name]

## 2023-03-01 NOTE — Transfer of Care (Signed)
Immediate Anesthesia Transfer of Care Note  Patient: [name]  Procedure(s) Performed: LEFT KNEE TOTAL KNEE ARTHROPLASTY (Left: Knee)  Patient Location: PACU  Anesthesia Type:Spinal  Level of Consciousness: awake, alert , oriented, and patient cooperative  Airway & Oxygen Therapy: Patient Spontanous Breathing and Patient connected to face mask oxygen  Post-op Assessment: Report given to RN and Post -op Vital signs reviewed and stable  Post vital signs: Reviewed and stable  Last Vitals:  Vitals Value Taken Time  BP 123/53 03/01/23 1245  Temp    Pulse 66 03/01/23 1249  Resp 13 03/01/23 1249  SpO2 100 % 03/01/23 1249  Vitals shown include unfiled device data.  Last Pain:  Vitals:   03/01/23 0945  TempSrc:   PainSc: 0-No pain      Patients Stated Pain Goal: 5 (03/01/23 1610)  Complications: No notable events documented.

## 2023-03-01 NOTE — Anesthesia Preprocedure Evaluation (Signed)
Anesthesia Evaluation  Patient identified by MRN, date of birth, ID band Patient awake    Reviewed: Allergy & Precautions, NPO status , Patient's Chart, lab work & pertinent test results  History of Anesthesia Complications (+) PONV and history of anesthetic complications  Airway Mallampati: II  TM Distance: >3 FB Neck ROM: Full    Dental  (+) Dental Advisory Given   Pulmonary asthma , sleep apnea    breath sounds clear to auscultation       Cardiovascular hypertension, Pt. on medications +CHF  + dysrhythmias  Rhythm:Regular Rate:Normal     Neuro/Psych  Headaches  Neuromuscular disease    GI/Hepatic Neg liver ROS,GERD  ,,  Endo/Other  diabetesHypothyroidism    Renal/GU CRFRenal disease     Musculoskeletal  (+) Arthritis ,  Fibromyalgia -  Abdominal   Peds  Hematology  (+) Blood dyscrasia, anemia   Anesthesia Other Findings   Reproductive/Obstetrics                             Anesthesia Physical Anesthesia Plan  ASA: 3  Anesthesia Plan: Spinal and MAC   Post-op Pain Management: Regional block* and Ofirmev IV (intra-op)*   Induction:   PONV Risk Score and Plan: 3 and Propofol infusion, Dexamethasone and Ondansetron  Airway Management Planned: Natural Airway and Simple Face Mask  Additional Equipment:   Intra-op Plan:   Post-operative Plan:   Informed Consent: I have reviewed the patients History and Physical, chart, labs and discussed the procedure including the risks, benefits and alternatives for the proposed anesthesia with the patient or authorized representative who has indicated his/her understanding and acceptance.       Plan Discussed with: CRNA  Anesthesia Plan Comments:        Anesthesia Quick Evaluation
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
## 2023-03-02 ENCOUNTER — Encounter (HOSPITAL_COMMUNITY): Payer: Self-pay | Admitting: Orthopaedic Surgery

## 2023-03-02 DIAGNOSIS — Z882 Allergy status to sulfonamides status: Secondary | ICD-10-CM | POA: Diagnosis not present

## 2023-03-02 DIAGNOSIS — Z8261 Family history of arthritis: Secondary | ICD-10-CM | POA: Diagnosis not present

## 2023-03-02 DIAGNOSIS — N1832 Chronic kidney disease, stage 3b: Secondary | ICD-10-CM | POA: Diagnosis present

## 2023-03-02 DIAGNOSIS — Z96652 Presence of left artificial knee joint: Secondary | ICD-10-CM

## 2023-03-02 DIAGNOSIS — E66812 Obesity, class 2: Secondary | ICD-10-CM | POA: Diagnosis present

## 2023-03-02 DIAGNOSIS — M1712 Unilateral primary osteoarthritis, left knee: Secondary | ICD-10-CM | POA: Diagnosis present

## 2023-03-02 DIAGNOSIS — I5032 Chronic diastolic (congestive) heart failure: Secondary | ICD-10-CM | POA: Diagnosis present

## 2023-03-02 DIAGNOSIS — K219 Gastro-esophageal reflux disease without esophagitis: Secondary | ICD-10-CM | POA: Diagnosis present

## 2023-03-02 DIAGNOSIS — Z794 Long term (current) use of insulin: Secondary | ICD-10-CM | POA: Diagnosis not present

## 2023-03-02 DIAGNOSIS — Z90710 Acquired absence of both cervix and uterus: Secondary | ICD-10-CM | POA: Diagnosis not present

## 2023-03-02 DIAGNOSIS — E039 Hypothyroidism, unspecified: Secondary | ICD-10-CM | POA: Diagnosis present

## 2023-03-02 DIAGNOSIS — I130 Hypertensive heart and chronic kidney disease with heart failure and stage 1 through stage 4 chronic kidney disease, or unspecified chronic kidney disease: Secondary | ICD-10-CM | POA: Diagnosis present

## 2023-03-02 DIAGNOSIS — Z888 Allergy status to other drugs, medicaments and biological substances status: Secondary | ICD-10-CM | POA: Diagnosis not present

## 2023-03-02 DIAGNOSIS — D62 Acute posthemorrhagic anemia: Secondary | ICD-10-CM | POA: Diagnosis not present

## 2023-03-02 DIAGNOSIS — F419 Anxiety disorder, unspecified: Secondary | ICD-10-CM | POA: Diagnosis present

## 2023-03-02 DIAGNOSIS — Z886 Allergy status to analgesic agent status: Secondary | ICD-10-CM | POA: Diagnosis not present

## 2023-03-02 DIAGNOSIS — G8929 Other chronic pain: Secondary | ICD-10-CM | POA: Diagnosis present

## 2023-03-02 DIAGNOSIS — E1122 Type 2 diabetes mellitus with diabetic chronic kidney disease: Secondary | ICD-10-CM | POA: Diagnosis present

## 2023-03-02 DIAGNOSIS — I2510 Atherosclerotic heart disease of native coronary artery without angina pectoris: Secondary | ICD-10-CM | POA: Diagnosis present

## 2023-03-02 DIAGNOSIS — E7800 Pure hypercholesterolemia, unspecified: Secondary | ICD-10-CM | POA: Diagnosis present

## 2023-03-02 DIAGNOSIS — Z6839 Body mass index (BMI) 39.0-39.9, adult: Secondary | ICD-10-CM | POA: Diagnosis not present

## 2023-03-02 DIAGNOSIS — G2581 Restless legs syndrome: Secondary | ICD-10-CM | POA: Diagnosis present

## 2023-03-02 DIAGNOSIS — J4540 Moderate persistent asthma, uncomplicated: Secondary | ICD-10-CM | POA: Diagnosis present

## 2023-03-02 DIAGNOSIS — Z885 Allergy status to narcotic agent status: Secondary | ICD-10-CM | POA: Diagnosis not present

## 2023-03-02 DIAGNOSIS — M797 Fibromyalgia: Secondary | ICD-10-CM | POA: Diagnosis present

## 2023-03-02 LAB — GLUCOSE, CAPILLARY
Glucose-Capillary: 135 mg/dL — ABNORMAL HIGH (ref 70–99)
Glucose-Capillary: 141 mg/dL — ABNORMAL HIGH (ref 70–99)
Glucose-Capillary: 109 mg/dL — ABNORMAL HIGH (ref 70–99)
Glucose-Capillary: 157 mg/dL — ABNORMAL HIGH (ref 70–99)

## 2023-03-02 LAB — BASIC METABOLIC PANEL
Anion gap: 10 (ref 5–15)
BUN: 45 mg/dL — ABNORMAL HIGH (ref 8–23)
CO2: 23 mmol/L (ref 22–32)
Calcium: 8.8 mg/dL — ABNORMAL LOW (ref 8.9–10.3)
Chloride: 102 mmol/L (ref 98–111)
Creatinine, Ser: 1.54 mg/dL — ABNORMAL HIGH (ref 0.44–1.00)
GFR, Estimated: 36 mL/min — ABNORMAL LOW (ref >60)
Glucose, Bld: 165 mg/dL — ABNORMAL HIGH (ref 70–99)
Potassium: 4.7 mmol/L (ref 3.5–5.1)
Sodium: 135 mmol/L (ref 135–145)

## 2023-03-02 LAB — CBC
HCT: 25.1 % — ABNORMAL LOW (ref 36.0–46.0)
Hemoglobin: 8.2 g/dL — ABNORMAL LOW (ref 12.0–15.0)
MCH: 29.4 pg (ref 26.0–34.0)
MCHC: 32.7 g/dL (ref 30.0–36.0)
MCV: 90 fL (ref 80.0–100.0)
Platelets: 110 10*3/uL — ABNORMAL LOW (ref 150–400)
RBC: 2.79 MIL/uL — ABNORMAL LOW (ref 3.87–5.11)
RDW: 14.1 % (ref 11.5–15.5)
WBC: 3.7 10*3/uL — ABNORMAL LOW (ref 4.0–10.5)
nRBC: 0 % (ref 0.0–0.2)

## 2023-03-02 LAB — PREPARE RBC (CROSSMATCH)

## 2023-03-02 MED ORDER — SODIUM CHLORIDE 0.9% IV SOLUTION: Freq: Once | INTRAVENOUS | Status: AC

## 2023-03-02 NOTE — TOC Transition Note (Signed)
Transition of Care [facility]) - CM/SW Discharge Note   Patient Details  Name: [name] MRN: [id number] Date of Birth: 07/17/1950  Transition of [facility]) CM/SW Contact:  Amada Jupiter, LCSW Phone Number: 03/02/2023, 11:37 AM   Clinical Narrative:     Met with pt who confirms she has needed DME in the home.  OPPT already arranged with [facility].  No further TOC needs.  Final next level of care: OP Rehab Barriers to Discharge: No Barriers Identified   Patient Goals and CMS Choice      Discharge Placement                         Discharge Plan and Services Additional resources added to the After Visit Summary for                  DME Arranged: N/A DME Agency: NA                  Social Determinants of Health (SDOH) Interventions SDOH Screenings   Food Insecurity: No Food Insecurity (03/01/2023)  Housing: Patient Declined (03/01/2023)  Transportation Needs: No Transportation Needs (03/01/2023)  Utilities: Not At Risk (03/01/2023)  Depression (PHQ2-9): Low Risk  (12/28/2022)  Financial Resource Strain: Low Risk  (06/14/2022)   Received from [facility], [facility]  Physical Activity: Inactive (12/24/2017)  Social Connections: Unknown (08/03/2021)   [facility], [facility]  Stress: No Stress Concern Present (07/07/2022)   Received from [facility], [facility]  Tobacco Use: Low Risk  (03/01/2023)     Readmission Risk Interventions    07/30/2022   10:24 AM 03/12/2022   11:50 AM  Readmission Risk Prevention Plan  Transportation Screening Complete Complete  PCP or Specialist Appt within 5-7 Days  Complete  PCP or Specialist Appt within 3-5 Days Complete   Home Care Screening  Complete  Medication Review (RN CM)  Complete  HRI or Home Care Consult Complete   Social Work Consult for Recovery Care Planning/Counseling Complete   Medication Review Oceanographer) Complete
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
## 2023-03-02 NOTE — Plan of Care (Signed)

## 2023-03-02 NOTE — Progress Notes (Signed)
Subjective: 1 Day Post-Op Procedure(s) (LRB): LEFT KNEE TOTAL KNEE ARTHROPLASTY (Left)  Patient resting comfortably in bed. She has not been up yet but Hgb is 8 this morning. She states that she has a history of needing transfusions through her hematologist.  Activity level:  wbat Diet tolerance:  ok Voiding:  ok Patient reports pain as mild and moderate.    Objective: Vital signs in last 24 hours: Temp:  [98 F (36.7 C)-98.6 F (37 C)] 98.6 F (37 C) (12/11 0505) Pulse Rate:  [61-74] 65 (12/11 0505) Resp:  [11-22] 16 (12/11 0505) BP: (111-142)/(32-99) 121/55 (12/11 0505) SpO2:  [93 %-100 %] 94 % (12/11 0505)  Labs: Recent Labs    03/01/23 0834 03/02/23 0326  HGB 10.2* 8.2*   Recent Labs    03/01/23 0834 03/02/23 0326  WBC 1.8* 3.7*  RBC 3.45* 2.79*  HCT 31.3* 25.1*  PLT 108* 110*   Recent Labs    03/02/23 0326  NA 135  K 4.7  CL 102  CO2 23  BUN 45*  CREATININE 1.54*  GLUCOSE 165*  CALCIUM 8.8*   No results for input(s): "LABPT", "INR" in the last 72 hours.  Physical Exam:  Neurologically intact ABD soft Neurovascular intact Sensation intact distally Intact pulses distally Dorsiflexion/Plantar flexion intact Incision: scant drainage No cellulitis present Compartment soft  Assessment/Plan:  1 Day Post-Op Procedure(s) (LRB): LEFT KNEE TOTAL KNEE ARTHROPLASTY (Left) Advance diet Up with therapy Plan for discharge tomorrow if doing well and cleared by PT. We will give 1 unit of blood due to her low Hgb.  Continue on 81mg  asa BID for dvt prevention. Follow up in office 2 weeks post op.  Ginger Organ Ares Cardozo 03/02/2023, 8:19 AM
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
## 2023-03-03 ENCOUNTER — Encounter (HOSPITAL_COMMUNITY): Payer: Self-pay | Admitting: Orthopaedic Surgery

## 2023-03-03 LAB — CBC
HCT: 28.7 % — ABNORMAL LOW (ref 36.0–46.0)
Hemoglobin: 9.6 g/dL — ABNORMAL LOW (ref 12.0–15.0)
MCH: 29.9 pg (ref 26.0–34.0)
MCHC: 33.4 g/dL (ref 30.0–36.0)
MCV: 89.4 fL (ref 80.0–100.0)
Platelets: 109 10*3/uL — ABNORMAL LOW (ref 150–400)
RBC: 3.21 MIL/uL — ABNORMAL LOW (ref 3.87–5.11)
RDW: 15.5 % (ref 11.5–15.5)
WBC: 3.4 10*3/uL — ABNORMAL LOW (ref 4.0–10.5)
nRBC: 0 % (ref 0.0–0.2)

## 2023-03-03 LAB — TYPE AND SCREEN
ABO/RH(D): A NEG
Antibody Screen: NEGATIVE
Unit division: 0

## 2023-03-03 LAB — GLUCOSE, CAPILLARY
Glucose-Capillary: 115 mg/dL — ABNORMAL HIGH (ref 70–99)
Glucose-Capillary: 159 mg/dL — ABNORMAL HIGH (ref 70–99)
Glucose-Capillary: 143 mg/dL — ABNORMAL HIGH (ref 70–99)
Glucose-Capillary: 166 mg/dL — ABNORMAL HIGH (ref 70–99)

## 2023-03-03 LAB — BPAM RBC
Blood Product Expiration Date: 202501102359
ISSUE DATE / TIME: 202412111247
Unit Type and Rh: 600

## 2023-03-03 MED ORDER — ASPIRIN 81 MG PO TBEC: 81.0000 mg | DELAYED_RELEASE_TABLET | Freq: Two times a day (BID) | ORAL | 0 refills | Status: DC

## 2023-03-03 MED ORDER — TIZANIDINE HCL 4 MG PO TABS: 4.0000 mg | ORAL_TABLET | Freq: Every day | ORAL | 0 refills | Status: AC

## 2023-03-03 MED ORDER — ONDANSETRON 4 MG PO TBDP: 4.0000 mg | ORAL_TABLET | Freq: Four times a day (QID) | ORAL | 0 refills | Status: AC | PRN

## 2023-03-03 MED ORDER — OXYCODONE-ACETAMINOPHEN 5-325 MG PO TABS: 1.0000 | ORAL_TABLET | Freq: Four times a day (QID) | ORAL | 0 refills | Status: DC | PRN

## 2023-03-03 MED ORDER — MUPIROCIN 2 % EX OINT: 1.0000 | TOPICAL_OINTMENT | Freq: Two times a day (BID) | CUTANEOUS | 2 refills | Status: DC

## 2023-03-03 NOTE — Discharge Summary (Cosign Needed Addendum)
Patient ID: [name] MRN: [id number] DOB/AGE: [birth year] 72 y.o.  Admit date: 03/01/2023 Discharge date: 03/04/2023  Admission Diagnoses:  Principal Problem:   Primary osteoarthritis of left knee Active Problems:   Primary localized osteoarthritis of left knee   S/P TKR (total knee replacement), left   Discharge Diagnoses:  Same  Past Medical History:  Diagnosis Date   Anemia    Arthritis    Asthma    OCCAS   Bell's palsy    Broken neck (HCC)    C-1   Cancer (HCC)    left ear squamus cell   Carbuncle and furuncle of trunk    Chronic dislocation of right shoulder    Depression    Disorder of fascia    HX OF NECROTIC FASCITIS AFTER ABDOMINAL SURGERY FOR HERNIA- REQUIRED 19 SURGERIES AND 2.5 MONTH HOSPITALIZATION AT [facility]   DM (diabetes mellitus) (HCC)    Dysrhythmia    HX OF TACHYCARDIA AND BRADYCARDIA - ON GOING FOR YEARS - DOES NOT HAVE TO SEE CARDIOLOGIST   Elevated cholesterol    Fibromyalgia    Fracture 07/2012   HX OF FRACTURED NECK C1- CAUSES SEVERE HEADACHES--LIMITED ROM NECK   Frequent infections    ESPECIALLY PRONE TO INFECTIONS AFTER SURGERIES   Graves disease    Heart murmur    DOES NOT CAUSE ANY PROBLEMS   History of kidney stones    Hyperlipidemia    Hypertension    Hypothyroidism    GRAVES DISEASE   Migraine    Nephrolithiasis    STAGE 3    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   Neuropathy    Obesity    OSA (obstructive sleep apnea)    USES CPAP - DOES NOT KNOW SETTING   Pain    LEFT SHOULDER  PAIN -HARD TO LIE ON LEFT SIDE FOR LONG PERIOD;  PAIN IN LOWER BADK - 3 HERNIATED DISCS AND STENOISIS -   Pneumonia    PONV (postoperative nausea and vomiting)    THE GAS MAKES ME NAUSEATED   Restless leg syndrome    Sciatica    Tachycardia    Urinary frequency    Urticaria    UTI (urinary tract infection)     Surgeries: Procedure(s): LEFT KNEE TOTAL KNEE ARTHROPLASTY on 03/01/2023   Consultants:   Discharged Condition: Improved  Hospital  Course: [name] is an 72 y.o. female who was admitted 03/01/2023 for operative treatment ofPrimary osteoarthritis of left knee. Patient has severe unremitting pain that affects sleep, daily activities, and work/hobbies. After pre-op clearance the patient was taken to the operating room on 03/01/2023 and underwent  Procedure(s): LEFT KNEE TOTAL KNEE ARTHROPLASTY.    Patient was given perioperative antibiotics:  Anti-infectives (From admission, onward)    Start     Dose/Rate Route Frequency Ordered Stop   03/01/23 0800  ceFAZolin (ANCEF) IVPB 2g/100 mL premix        2 g 200 mL/hr over 30 Minutes Intravenous On call to O.R. 03/01/23 0750 03/01/23 1012        Patient was given sequential compression devices, early ambulation, and chemoprophylaxis to prevent DVT.  Patient had a Hgb of 8.2 post op day one and was given a unit of blood. She tolerated this well.   Patient benefited maximally from hospital stay and there were no complications.    Recent vital signs: Patient Vitals for the past 24 hrs:  BP Temp Temp src Pulse Resp SpO2  03/03/23 0943 (!) 150/49 -- --  70 -- --  03/03/23 0938 (!) 132/46 100 F (37.8 C) Oral 74 17 97 %  03/03/23 0533 (!) 153/58 99.5 F (37.5 C) Oral 74 18 93 %  03/02/23 2225 -- -- -- -- -- 95 %  03/02/23 2118 (!) 164/52 -- -- 71 -- --  03/02/23 2032 (!) 164/52 99.4 F (37.4 C) Oral 71 18 97 %  03/02/23 1530 (!) 124/54 98.3 F (36.8 C) Oral 64 18 97 %  03/02/23 1310 (!) 125/50 98.2 F (36.8 C) -- 64 15 96 %  03/02/23 1253 (!) 128/41 98.3 F (36.8 C) -- 62 16 95 %     Recent laboratory studies:  Recent Labs    03/02/23 0326 03/03/23 0331  WBC 3.7* 3.4*  HGB 8.2* 9.6*  HCT 25.1* 28.7*  PLT 110* 109*  NA 135  --   K 4.7  --   CL 102  --   CO2 23  --   BUN 45*  --   CREATININE 1.54*  --   GLUCOSE 165*  --   CALCIUM 8.8*  --      Discharge Medications:   Allergies as of 03/03/2023       Reactions   Morphine Other (See Comments)    Severe headaches Terrible headaches    Severe headaches    Terrible headaches Severe headaches   Promethazine Hcl Other (See Comments)   Makes restless leg syndrome worse    Restless leg feeling all over body   Sumatriptan Anaphylaxis, Other (See Comments)   Vascular spasms Other Reaction(s): Other Facial spasms, Vasospasms Other Reaction(s): Other (See Comments) Facial spasms Vasospasms    Vascular spasms  Other Reaction(s): Other  Facial spasms, Vasospasms    Facial spasms, Vasospasms   Codeine Nausea And Vomiting, Nausea Only, Other (See Comments)   Other Reaction(s): GI Intolerance Other Reaction(s): Abdominal Pain   Hydrocodone-acetaminophen Nausea And Vomiting   Other Reaction(s): GI Intolerance Other Reaction(s): Other (See Comments)   Misc. Sulfonamide Containing Compounds Hives, Rash   Childhood allergy   Nsaids Other (See Comments)   PT UNABLE TO TOLERATE NSAID'S DRUGS DUE TO HX OF GASTRIC SLEEVE SURGERY Other Reaction(s): Other (See Comments)   Statins Other (See Comments)   Leg/muscle pain   Sulfonamide Derivatives Hives   Childhood allergy   Methimazole Hives   Niacin    heart pounding and beating fast. Other Reaction(s): Unknown        Medication List     STOP taking these medications    diclofenac 75 MG EC tablet Commonly known as: VOLTAREN       TAKE these medications    acetaminophen 500 MG tablet Commonly known as: TYLENOL Take 2 tablets (1,000 mg total) by mouth every 6 (six) hours as needed for mild pain (or Fever >/= 101).   amLODipine 5 MG tablet Commonly known as: NORVASC Take 5 mg by mouth at bedtime.   aspirin EC 81 MG tablet Take 1 tablet (81 mg total) by mouth 2 (two) times daily after a meal. For 2 weeks then once a day for 2 weeks for DVT prevention.   Basaglar KwikPen 100 UNIT/ML Inject 22 Units into the skin 2 (two) times daily.   BD Insulin Syringe U/F 31G X 5/16" 0.5 ML Misc Generic drug: Insulin Syringe-Needle  U-100 USE 5 TIMES A DAY LEVEMIR AND NOVOLOG   BD Pen Needle Micro U/F 32G X 6 MM Misc Generic drug: Insulin Pen Needle Inject 32 pens into the skin daily.  clonazePAM 0.5 MG tablet Commonly known as: KLONOPIN Take 0.5 mg by mouth See admin instructions. Take 0.5 mg at bedtime, may take an additional 0.5 mg tablet twice daily as needed for anxiety   Creon 36000-114000 units Cpep capsule Generic drug: lipase/protease/amylase Take 36,000 Units by mouth 3 (three) times daily with meals.   Dialyvite Vitamin D 5000 125 MCG (5000 UT) capsule Generic drug: Cholecalciferol Take 5,000 Units by mouth daily.   doxazosin 2 MG tablet Commonly known as: CARDURA Take 2 mg by mouth daily.   fenofibrate 54 MG tablet Take 54 mg by mouth daily with supper.   FLUoxetine 20 MG capsule Commonly known as: PROZAC Take 40 mg by mouth at bedtime.   fluticasone 50 MCG/ACT nasal spray Commonly known as: FLONASE Place 2 sprays into both nostrils daily as needed for allergies or rhinitis.   folic acid 1 MG tablet Commonly known as: FOLVITE Take 1 mg by mouth daily.   FREESTYLE TEST STRIPS test strip Generic drug: glucose blood USE STRIPS TO TEST BLOOD GLUCOSE BID   furosemide 40 MG tablet Commonly known as: LASIX Take 40 mg by mouth daily.   gabapentin 300 MG capsule Commonly known as: NEURONTIN Take 1 capsule (300 mg total) by mouth 3 (three) times daily for 30 days.   insulin aspart 100 UNIT/ML FlexPen Commonly known as: NOVOLOG Sliding scale dose: CBG < 70: implement hypoglycemia protocol  CBG 70 - 120: 0 units  CBG 121 - 150: 0 units  CBG 151 - 200: 0 units  CBG 201 - 250: 2 units  CBG 251 - 300: 3 units  CBG 301 - 350: 4 units  CBG 351 - 400: 5 units What changed:  how much to take how to take this when to take this reasons to take this additional instructions   metoprolol tartrate 50 MG tablet Commonly known as: LOPRESSOR Take 50 mg by mouth 2 (two) times daily.    mupirocin ointment 2 % Commonly known as: BACTROBAN Apply 1 Application topically 2 (two) times daily.   NON FORMULARY Pt uses a c-pap nightly   nutrition supplement (JUVEN) Pack Take 1 packet by mouth 2 (two) times daily between meals.   omeprazole 20 MG capsule Commonly known as: PRILOSEC Take 20 mg by mouth daily with supper.   ondansetron 4 MG disintegrating tablet Commonly known as: ZOFRAN-ODT Take 1 tablet (4 mg total) by mouth every 6 (six) hours as needed for nausea or vomiting.   oxybutynin 10 MG 24 hr tablet Commonly known as: DITROPAN-XL Take 10 mg by mouth daily.   oxyCODONE-acetaminophen 5-325 MG tablet Commonly known as: Percocet Take 1-2 tablets by mouth every 6 (six) hours as needed for severe pain (pain score 7-10) or moderate pain (pain score 4-6) (post op pain).   propylthiouracil 50 MG tablet Commonly known as: PTU Take 50 mg by mouth daily.   rOPINIRole 2 MG tablet Commonly known as: REQUIP Take 2-3 mg by mouth See admin instructions. Take 2 mg by mouth in the morning and take 3 mg in the evening   tiZANidine 4 MG tablet Commonly known as: ZANAFLEX Take 1 tablet (4 mg total) by mouth at bedtime.   Unistik 3 Comfort Misc USE LANCETS TO CHECK BLOOD GLUCOSE BID   Ventolin HFA 108 (90 Base) MCG/ACT inhaler Generic drug: albuterol Inhale 2 puffs into the lungs every 4 (four) hours as needed for shortness of breath.  Durable Medical Equipment  (From admission, onward)           Start     Ordered   03/01/23 1522  DME Walker rolling  Once       Question:  Patient needs a walker to treat with the following condition  Answer:  Primary osteoarthritis of left knee   03/01/23 1521   03/01/23 1522  DME 3 n 1  Once        03/01/23 1521   03/01/23 1522  DME Bedside commode  Once       Question:  Patient needs a bedside commode to treat with the following condition  Answer:  Primary osteoarthritis of left knee   03/01/23 1521             Diagnostic Studies: No results found.  Disposition: Discharge disposition: 01-Home or Self Care       Discharge Instructions     Call MD / Call 911   Complete by: As directed    If you experience chest pain or shortness of breath, CALL 911 and be transported to the hospital emergency room.  If you develope a fever above 101 F, pus (white drainage) or increased drainage or redness at the wound, or calf pain, call your surgeon's office.   Constipation Prevention   Complete by: As directed    Drink plenty of fluids.  Prune juice may be helpful.  You may use a stool softener, such as Colace (over the counter) 100 mg twice a day.  Use MiraLax (over the counter) for constipation as needed.   Diet - low sodium heart healthy   Complete by: As directed    Discharge instructions   Complete by: As directed    INSTRUCTIONS AFTER JOINT REPLACEMENT   Remove items at home which could result in a fall. This includes throw rugs or furniture in walking pathways ICE to the affected joint every three hours while awake for 30 minutes at a time, for at least the first 3-5 days, and then as needed for pain and swelling.  Continue to use ice for pain and swelling. You may notice swelling that will progress down to the foot and ankle.  This is normal after surgery.  Elevate your leg when you are not up walking on it.   Continue to use the breathing machine you got in the hospital (incentive spirometer) which will help keep your temperature down.  It is common for your temperature to cycle up and down following surgery, especially at night when you are not up moving around and exerting yourself.  The breathing machine keeps your lungs expanded and your temperature down.   DIET:  As you were doing prior to hospitalization, we recommend a well-balanced diet.  DRESSING / WOUND CARE / SHOWERING  You may shower 3 days after surgery, but keep the wounds dry during showering.  You may use an occlusive  plastic wrap (Press'n Seal for example), NO SOAKING/SUBMERGING IN THE BATHTUB.  If the bandage gets wet, change with a clean dry gauze.  If the incision gets wet, pat the wound dry with a clean towel.  ACTIVITY  Increase activity slowly as tolerated, but follow the weight bearing instructions below.   No driving for 6 weeks or until further direction given by your physician.  You cannot drive while taking narcotics.  No lifting or carrying greater than 10 lbs. until further directed by your surgeon. Avoid periods of inactivity such as sitting longer than  an hour when not asleep. This helps prevent blood clots.  You may return to work once you are authorized by your doctor.     WEIGHT BEARING   Weight bearing as tolerated with assist device (walker, cane, etc) as directed, use it as long as suggested by your surgeon or therapist, typically at least 4-6 weeks.   EXERCISES  Results after joint replacement surgery are often greatly improved when you follow the exercise, range of motion and muscle strengthening exercises prescribed by your doctor. Safety measures are also important to protect the joint from further injury. Any time any of these exercises cause you to have increased pain or swelling, decrease what you are doing until you are comfortable again and then slowly increase them. If you have problems or questions, call your caregiver or physical therapist for advice.   Rehabilitation is important following a joint replacement. After just a few days of immobilization, the muscles of the leg can become weakened and shrink (atrophy).  These exercises are designed to build up the tone and strength of the thigh and leg muscles and to improve motion. Often times heat used for twenty to thirty minutes before working out will loosen up your tissues and help with improving the range of motion but do not use heat for the first two weeks following surgery (sometimes heat can increase post-operative  swelling).   These exercises can be done on a training (exercise) mat, on the floor, on a table or on a bed. Use whatever works the best and is most comfortable for you.    Use music or television while you are exercising so that the exercises are a pleasant break in your day. This will make your life better with the exercises acting as a break in your routine that you can look forward to.   Perform all exercises about fifteen times, three times per day or as directed.  You should exercise both the operative leg and the other leg as well.  Exercises include:   Quad Sets - Tighten up the muscle on the front of the thigh (Quad) and hold for 5-10 seconds.   Straight Leg Raises - With your knee straight (if you were given a brace, keep it on), lift the leg to 60 degrees, hold for 3 seconds, and slowly lower the leg.  Perform this exercise against resistance later as your leg gets stronger.  Leg Slides: Lying on your back, slowly slide your foot toward your buttocks, bending your knee up off the floor (only go as far as is comfortable). Then slowly slide your foot back down until your leg is flat on the floor again.  Angel Wings: Lying on your back spread your legs to the side as far apart as you can without causing discomfort.  Hamstring Strength:  Lying on your back, push your heel against the floor with your leg straight by tightening up the muscles of your buttocks.  Repeat, but this time bend your knee to a comfortable angle, and push your heel against the floor.  You may put a pillow under the heel to make it more comfortable if necessary.   A rehabilitation program following joint replacement surgery can speed recovery and prevent re-injury in the future due to weakened muscles. Contact your doctor or a physical therapist for more information on knee rehabilitation.    CONSTIPATION  Constipation is defined medically as fewer than three stools per week and severe constipation as less than one stool  per  week.  Even if you have a regular bowel pattern at home, your normal regimen is likely to be disrupted due to multiple reasons following surgery.  Combination of anesthesia, postoperative narcotics, change in appetite and fluid intake all can affect your bowels.   YOU MUST use at least one of the following options; they are listed in order of increasing strength to get the job done.  They are all available over the counter, and you may need to use some, POSSIBLY even all of these options:    Drink plenty of fluids (prune juice may be helpful) and high fiber foods Colace 100 mg by mouth twice a day  Senokot for constipation as directed and as needed Dulcolax (bisacodyl), take with full glass of water  Miralax (polyethylene glycol) once or twice a day as needed.  If you have tried all these things and are unable to have a bowel movement in the first 3-4 days after surgery call either your surgeon or your primary doctor.    If you experience loose stools or diarrhea, hold the medications until you stool forms back up.  If your symptoms do not get better within 1 week or if they get worse, check with your doctor.  If you experience "the worst abdominal pain ever" or develop nausea or vomiting, please contact the office immediately for further recommendations for treatment.   ITCHING:  If you experience itching with your medications, try taking only a single pain pill, or even half a pain pill at a time.  You can also use Benadryl over the counter for itching or also to help with sleep.   TED HOSE STOCKINGS:  Use stockings on both legs until for at least 2 weeks or as directed by physician office. They may be removed at night for sleeping.  MEDICATIONS:  See your medication summary on the "After Visit Summary" that nursing will review with you.  You may have some home medications which will be placed on hold until you complete the course of blood thinner medication.  It is important for you to  complete the blood thinner medication as prescribed.  PRECAUTIONS:  If you experience chest pain or shortness of breath - call 911 immediately for transfer to the hospital emergency department.   If you develop a fever greater that 101 F, purulent drainage from wound, increased redness or drainage from wound, foul odor from the wound/dressing, or calf pain - CONTACT YOUR SURGEON.                                                   FOLLOW-UP APPOINTMENTS:  If you do not already have a post-op appointment, please call the office for an appointment to be seen by your surgeon.  Guidelines for how soon to be seen are listed in your "After Visit Summary", but are typically between 1-4 weeks after surgery.  OTHER INSTRUCTIONS:   Knee Replacement:  Do not place pillow under knee, focus on keeping the knee straight while resting. CPM instructions: 0-90 degrees, 2 hours in the morning, 2 hours in the afternoon, and 2 hours in the evening. Place foam block, curve side up under heel at all times except when in CPM or when walking.  DO NOT modify, tear, cut, or change the foam block in any way.  POST-OPERATIVE OPIOID TAPER INSTRUCTIONS: It is  important to wean off of your opioid medication as soon as possible. If you do not need pain medication after your surgery it is ok to stop day one. Opioids include: Codeine, Hydrocodone(Norco, Vicodin), Oxycodone(Percocet, oxycontin) and hydromorphone amongst others.  Long term and even short term use of opiods can cause: Increased pain response Dependence Constipation Depression Respiratory depression And more.  Withdrawal symptoms can include Flu like symptoms Nausea, vomiting And more Techniques to manage these symptoms Hydrate well Eat regular healthy meals Stay active Use relaxation techniques(deep breathing, meditating, yoga) Do Not substitute Alcohol to help with tapering If you have been on opioids for less than two weeks and do not have pain than it  is ok to stop all together.  Plan to wean off of opioids This plan should start within one week post op of your joint replacement. Maintain the same interval or time between taking each dose and first decrease the dose.  Cut the total daily intake of opioids by one tablet each day Next start to increase the time between doses. The last dose that should be eliminated is the evening dose.     MAKE SURE YOU:  Understand these instructions.  Get help right away if you are not doing well or get worse.    Thank you for letting us be a part of your medical care team.  It is a privilege we respect greatly.  We hope these instructions will help you stay on track for a fast and full recovery!   Increase activity slowly as tolerated   Complete by: As directed    Post-operative opioid taper instructions:   Complete by: As directed    POST-OPERATIVE OPIOID TAPER INSTRUCTIONS: It is important to wean off of your opioid medication as soon as possible. If you do not need pain medication after your surgery it is ok to stop day one. Opioids include: Codeine, Hydrocodone(Norco, Vicodin), Oxycodone(Percocet, oxycontin) and hydromorphone amongst others.  Long term and even short term use of opiods can cause: Increased pain response Dependence Constipation Depression Respiratory depression And more.  Withdrawal symptoms can include Flu like symptoms Nausea, vomiting And more Techniques to manage these symptoms Hydrate well Eat regular healthy meals Stay active Use relaxation techniques(deep breathing, meditating, yoga) Do Not substitute Alcohol to help with tapering If you have been on opioids for less than two weeks and do not have pain than it is ok to stop all together.  Plan to wean off of opioids This plan should start within one week post op of your joint replacement. Maintain the same interval or time between taking each dose and first decrease the dose.  Cut the total daily intake of  opioids by one tablet each day Next start to increase the time between doses. The last dose that should be eliminated is the evening dose.           Follow-up Information     Marcene Corning, MD. Go on 03/11/2023.   Specialty: Orthopedic Surgery Why: Your appointment is scheduled for 10:45 Contact information: [address] [place] [address] [phone]         [facility] Go on 03/04/2023.   Why: Your appointment is scheduled for 2:30. Please arrive at 2:15 to complete your paperwork Contact information: Physical Therapy [address] [place] [address] [phone]                  Signed: Ginger Organ Jessi Jessop 03/03/2023, 9:56 AM

## 2023-03-03 NOTE — Progress Notes (Addendum)
Subjective: 2 Days Post-Op Procedure(s) (LRB): LEFT KNEE TOTAL KNEE ARTHROPLASTY (Left) Patient doing well this morning. She has a little more pain than yesterday. She is hoping to go home today.  Activity level:  wbat Diet tolerance:  ok Voiding:  ok Patient reports pain as mild and moderate.    Objective: Vital signs in last 24 hours: Temp:  [98.2 F (36.8 C)-99.5 F (37.5 C)] 99.5 F (37.5 C) (12/12 0533) Pulse Rate:  [61-74] 74 (12/12 0533) Resp:  [15-18] 18 (12/12 0533) BP: (124-164)/(39-58) 153/58 (12/12 0533) SpO2:  [93 %-98 %] 93 % (12/12 0533)  Labs: Recent Labs    03/01/23 0834 03/02/23 0326 03/03/23 0331  HGB 10.2* 8.2* 9.6*   Recent Labs    03/02/23 0326 03/03/23 0331  WBC 3.7* 3.4*  RBC 2.79* 3.21*  HCT 25.1* 28.7*  PLT 110* 109*   Recent Labs    03/02/23 0326  NA 135  K 4.7  CL 102  CO2 23  BUN 45*  CREATININE 1.54*  GLUCOSE 165*  CALCIUM 8.8*   No results for input(s): "LABPT", "INR" in the last 72 hours.  Physical Exam:  Neurologically intact ABD soft Neurovascular intact Sensation intact distally Intact pulses distally Dorsiflexion/Plantar flexion intact Incision: dressing C/D/I and no drainage No cellulitis present Compartment soft  Assessment/Plan: Class 2 obesity Acute blood loss anemia  2 Days Post-Op Procedure(s) (LRB): LEFT KNEE TOTAL KNEE ARTHROPLASTY (Left) Advance diet Up with therapy Discharge home with home health today if cleared by PT and doing well. Follow up in office 2 weeks post op. Continue on 81mg  asa BID for dvt prevention.    Ginger Organ Deisha Stull 03/03/2023, 7:00 AM
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
## 2023-03-03 NOTE — Discharge Summary (Deleted)
Patient ID: [name] MRN: [id number] DOB/AGE: 70-12-1950 70 y.o.  Admit date: 03/01/2023 Discharge date: 03/03/2023  Admission Diagnoses:  Principal Problem:   Primary osteoarthritis of left knee Active Problems:   Primary localized osteoarthritis of left knee   S/P TKR (total knee replacement), left   Discharge Diagnoses:  Same  Past Medical History:  Diagnosis Date   Anemia    Arthritis    Asthma    OCCAS   Bell's palsy    Broken neck (HCC)    C-1   Cancer (HCC)    left ear squamus cell   Carbuncle and furuncle of trunk    Chronic dislocation of right shoulder    Depression    Disorder of fascia    HX OF NECROTIC FASCITIS AFTER ABDOMINAL SURGERY FOR HERNIA- REQUIRED 19 SURGERIES AND 2.5 MONTH HOSPITALIZATION AT [facility]   DM (diabetes mellitus) (HCC)    Dysrhythmia    HX OF TACHYCARDIA AND BRADYCARDIA - ON GOING FOR YEARS - DOES NOT HAVE TO SEE CARDIOLOGIST   Elevated cholesterol    Fibromyalgia    Fracture 07/2012   HX OF FRACTURED NECK C1- CAUSES SEVERE HEADACHES--LIMITED ROM NECK   Frequent infections    ESPECIALLY PRONE TO INFECTIONS AFTER SURGERIES   Graves disease    Heart murmur    DOES NOT CAUSE ANY PROBLEMS   History of kidney stones    Hyperlipidemia    Hypertension    Hypothyroidism    GRAVES DISEASE   Migraine    Nephrolithiasis    STAGE 3    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   Neuropathy    Obesity    OSA (obstructive sleep apnea)    USES CPAP - DOES NOT KNOW SETTING   Pain    LEFT SHOULDER  PAIN -HARD TO LIE ON LEFT SIDE FOR LONG PERIOD;  PAIN IN LOWER BADK - 3 HERNIATED DISCS AND STENOISIS -   Pneumonia    PONV (postoperative nausea and vomiting)    THE GAS MAKES ME NAUSEATED   Restless leg syndrome    Sciatica    Tachycardia    Urinary frequency    Urticaria    UTI (urinary tract infection)     Surgeries: Procedure(s): LEFT KNEE TOTAL KNEE ARTHROPLASTY on 03/01/2023   Consultants:   Discharged Condition: Improved  Hospital  Course: [name] is an 72 y.o. female who was admitted 03/01/2023 for operative treatment ofPrimary osteoarthritis of left knee. Patient has severe unremitting pain that affects sleep, daily activities, and work/hobbies. After pre-op clearance the patient was taken to the operating room on 03/01/2023 and underwent  Procedure(s): LEFT KNEE TOTAL KNEE ARTHROPLASTY.    Patient was given perioperative antibiotics:  Anti-infectives (From admission, onward)    Start     Dose/Rate Route Frequency Ordered Stop   03/01/23 0800  ceFAZolin (ANCEF) IVPB 2g/100 mL premix        2 g 200 mL/hr over 30 Minutes Intravenous On call to O.R. 03/01/23 0750 03/01/23 1012        Patient was given sequential compression devices, early ambulation, and chemoprophylaxis to prevent DVT.  Patient had a Hgb of 8.2 post op day one and was given a unit of blood. She tolerated this well.  Patient benefited maximally from hospital stay and there were no complications.    Recent vital signs: Patient Vitals for the past 24 hrs:  BP Temp Temp src Pulse Resp SpO2  03/03/23 0533 (!) 153/58 99.5 F (  37.5 C) Oral 74 18 93 %  03/02/23 2225 -- -- -- -- -- 95 %  03/02/23 2118 (!) 164/52 -- -- 71 -- --  03/02/23 2032 (!) 164/52 99.4 F (37.4 C) Oral 71 18 97 %  03/02/23 1530 (!) 124/54 98.3 F (36.8 C) Oral 64 18 97 %  03/02/23 1310 (!) 125/50 98.2 F (36.8 C) -- 64 15 96 %  03/02/23 1253 (!) 128/41 98.3 F (36.8 C) -- 62 16 95 %  03/02/23 0948 (!) 124/39 98.4 F (36.9 C) -- 61 18 98 %     Recent laboratory studies:  Recent Labs    03/02/23 0326 03/03/23 0331  WBC 3.7* 3.4*  HGB 8.2* 9.6*  HCT 25.1* 28.7*  PLT 110* 109*  NA 135  --   K 4.7  --   CL 102  --   CO2 23  --   BUN 45*  --   CREATININE 1.54*  --   GLUCOSE 165*  --   CALCIUM 8.8*  --      Discharge Medications:   Allergies as of 03/03/2023       Reactions   Morphine Other (See Comments)   Severe headaches Terrible headaches     Severe headaches    Terrible headaches Severe headaches   Promethazine Hcl Other (See Comments)   Makes restless leg syndrome worse    Restless leg feeling all over body   Sumatriptan Anaphylaxis, Other (See Comments)   Vascular spasms Other Reaction(s): Other Facial spasms, Vasospasms Other Reaction(s): Other (See Comments) Facial spasms Vasospasms    Vascular spasms  Other Reaction(s): Other  Facial spasms, Vasospasms    Facial spasms, Vasospasms   Codeine Nausea And Vomiting, Nausea Only, Other (See Comments)   Other Reaction(s): GI Intolerance Other Reaction(s): Abdominal Pain   Hydrocodone-acetaminophen Nausea And Vomiting   Other Reaction(s): GI Intolerance Other Reaction(s): Other (See Comments)   Misc. Sulfonamide Containing Compounds Hives, Rash   Childhood allergy   Nsaids Other (See Comments)   PT UNABLE TO TOLERATE NSAID'S DRUGS DUE TO HX OF GASTRIC SLEEVE SURGERY Other Reaction(s): Other (See Comments)   Statins Other (See Comments)   Leg/muscle pain   Sulfonamide Derivatives Hives   Childhood allergy   Methimazole Hives   Niacin    heart pounding and beating fast. Other Reaction(s): Unknown        Medication List     STOP taking these medications    diclofenac 75 MG EC tablet Commonly known as: VOLTAREN       TAKE these medications    acetaminophen 500 MG tablet Commonly known as: TYLENOL Take 2 tablets (1,000 mg total) by mouth every 6 (six) hours as needed for mild pain (or Fever >/= 101).   amLODipine 5 MG tablet Commonly known as: NORVASC Take 5 mg by mouth at bedtime.   Basaglar KwikPen 100 UNIT/ML Inject 22 Units into the skin 2 (two) times daily.   BD Insulin Syringe U/F 31G X 5/16" 0.5 ML Misc Generic drug: Insulin Syringe-Needle U-100 USE 5 TIMES A DAY LEVEMIR AND NOVOLOG   BD Pen Needle Micro U/F 32G X 6 MM Misc Generic drug: Insulin Pen Needle Inject 32 pens into the skin daily.   clonazePAM 0.5 MG tablet Commonly known  as: KLONOPIN Take 0.5 mg by mouth See admin instructions. Take 0.5 mg at bedtime, may take an additional 0.5 mg tablet twice daily as needed for anxiety   Creon 36000-114000 units Cpep capsule Generic drug:  lipase/protease/amylase Take 36,000 Units by mouth 3 (three) times daily with meals.   Dialyvite Vitamin D 5000 125 MCG (5000 UT) capsule Generic drug: Cholecalciferol Take 5,000 Units by mouth daily.   doxazosin 2 MG tablet Commonly known as: CARDURA Take 2 mg by mouth daily.   fenofibrate 54 MG tablet Take 54 mg by mouth daily with supper.   FLUoxetine 20 MG capsule Commonly known as: PROZAC Take 40 mg by mouth at bedtime.   fluticasone 50 MCG/ACT nasal spray Commonly known as: FLONASE Place 2 sprays into both nostrils daily as needed for allergies or rhinitis.   folic acid 1 MG tablet Commonly known as: FOLVITE Take 1 mg by mouth daily.   FREESTYLE TEST STRIPS test strip Generic drug: glucose blood USE STRIPS TO TEST BLOOD GLUCOSE BID   furosemide 40 MG tablet Commonly known as: LASIX Take 40 mg by mouth daily.   gabapentin 300 MG capsule Commonly known as: NEURONTIN Take 1 capsule (300 mg total) by mouth 3 (three) times daily for 30 days.   insulin aspart 100 UNIT/ML FlexPen Commonly known as: NOVOLOG Sliding scale dose: CBG < 70: implement hypoglycemia protocol  CBG 70 - 120: 0 units  CBG 121 - 150: 0 units  CBG 151 - 200: 0 units  CBG 201 - 250: 2 units  CBG 251 - 300: 3 units  CBG 301 - 350: 4 units  CBG 351 - 400: 5 units What changed:  how much to take how to take this when to take this reasons to take this additional instructions   metoprolol tartrate 50 MG tablet Commonly known as: LOPRESSOR Take 50 mg by mouth 2 (two) times daily.   mupirocin ointment 2 % Commonly known as: BACTROBAN Apply 1 Application topically 2 (two) times daily.   NON FORMULARY Pt uses a c-pap nightly   nutrition supplement (JUVEN) Pack Take 1 packet by mouth  2 (two) times daily between meals.   omeprazole 20 MG capsule Commonly known as: PRILOSEC Take 20 mg by mouth daily with supper.   ondansetron 4 MG disintegrating tablet Commonly known as: ZOFRAN-ODT Take 1 tablet (4 mg total) by mouth every 6 (six) hours as needed for nausea or vomiting.   oxybutynin 10 MG 24 hr tablet Commonly known as: DITROPAN-XL Take 10 mg by mouth daily.   oxyCODONE-acetaminophen 5-325 MG tablet Commonly known as: Percocet Take 1-2 tablets by mouth every 6 (six) hours as needed for severe pain (pain score 7-10) or moderate pain (pain score 4-6) (post op pain).   propylthiouracil 50 MG tablet Commonly known as: PTU Take 50 mg by mouth daily.   rOPINIRole 2 MG tablet Commonly known as: REQUIP Take 2-3 mg by mouth See admin instructions. Take 2 mg by mouth in the morning and take 3 mg in the evening   tiZANidine 4 MG tablet Commonly known as: ZANAFLEX Take 1 tablet (4 mg total) by mouth at bedtime.   Unistik 3 Comfort Misc USE LANCETS TO CHECK BLOOD GLUCOSE BID   Ventolin HFA 108 (90 Base) MCG/ACT inhaler Generic drug: albuterol Inhale 2 puffs into the lungs every 4 (four) hours as needed for shortness of breath.               Durable Medical Equipment  (From admission, onward)           Start     Ordered   03/01/23 1522  DME Walker rolling  Once       Question:  Patient needs a walker to treat with the following condition  Answer:  Primary osteoarthritis of left knee   03/01/23 1521   03/01/23 1522  DME 3 n 1  Once        03/01/23 1521   03/01/23 1522  DME Bedside commode  Once       Question:  Patient needs a bedside commode to treat with the following condition  Answer:  Primary osteoarthritis of left knee   03/01/23 1521            Diagnostic Studies: No results found.  Disposition: Discharge disposition: 01-Home or Self Care       Discharge Instructions     Call MD / Call 911   Complete by: As directed    If you  experience chest pain or shortness of breath, CALL 911 and be transported to the hospital emergency room.  If you develope a fever above 101 F, pus (white drainage) or increased drainage or redness at the wound, or calf pain, call your surgeon's office.   Constipation Prevention   Complete by: As directed    Drink plenty of fluids.  Prune juice may be helpful.  You may use a stool softener, such as Colace (over the counter) 100 mg twice a day.  Use MiraLax (over the counter) for constipation as needed.   Diet - low sodium heart healthy   Complete by: As directed    Discharge instructions   Complete by: As directed    INSTRUCTIONS AFTER JOINT REPLACEMENT   Remove items at home which could result in a fall. This includes throw rugs or furniture in walking pathways ICE to the affected joint every three hours while awake for 30 minutes at a time, for at least the first 3-5 days, and then as needed for pain and swelling.  Continue to use ice for pain and swelling. You may notice swelling that will progress down to the foot and ankle.  This is normal after surgery.  Elevate your leg when you are not up walking on it.   Continue to use the breathing machine you got in the hospital (incentive spirometer) which will help keep your temperature down.  It is common for your temperature to cycle up and down following surgery, especially at night when you are not up moving around and exerting yourself.  The breathing machine keeps your lungs expanded and your temperature down.   DIET:  As you were doing prior to hospitalization, we recommend a well-balanced diet.  DRESSING / WOUND CARE / SHOWERING  You may shower 3 days after surgery, but keep the wounds dry during showering.  You may use an occlusive plastic wrap (Press'n Seal for example), NO SOAKING/SUBMERGING IN THE BATHTUB.  If the bandage gets wet, change with a clean dry gauze.  If the incision gets wet, pat the wound dry with a clean  towel.  ACTIVITY  Increase activity slowly as tolerated, but follow the weight bearing instructions below.   No driving for 6 weeks or until further direction given by your physician.  You cannot drive while taking narcotics.  No lifting or carrying greater than 10 lbs. until further directed by your surgeon. Avoid periods of inactivity such as sitting longer than an hour when not asleep. This helps prevent blood clots.  You may return to work once you are authorized by your doctor.     WEIGHT BEARING   Weight bearing as tolerated with assist device (walker, cane, etc)  as directed, use it as long as suggested by your surgeon or therapist, typically at least 4-6 weeks.   EXERCISES  Results after joint replacement surgery are often greatly improved when you follow the exercise, range of motion and muscle strengthening exercises prescribed by your doctor. Safety measures are also important to protect the joint from further injury. Any time any of these exercises cause you to have increased pain or swelling, decrease what you are doing until you are comfortable again and then slowly increase them. If you have problems or questions, call your caregiver or physical therapist for advice.   Rehabilitation is important following a joint replacement. After just a few days of immobilization, the muscles of the leg can become weakened and shrink (atrophy).  These exercises are designed to build up the tone and strength of the thigh and leg muscles and to improve motion. Often times heat used for twenty to thirty minutes before working out will loosen up your tissues and help with improving the range of motion but do not use heat for the first two weeks following surgery (sometimes heat can increase post-operative swelling).   These exercises can be done on a training (exercise) mat, on the floor, on a table or on a bed. Use whatever works the best and is most comfortable for you.    Use music or  television while you are exercising so that the exercises are a pleasant break in your day. This will make your life better with the exercises acting as a break in your routine that you can look forward to.   Perform all exercises about fifteen times, three times per day or as directed.  You should exercise both the operative leg and the other leg as well.  Exercises include:   Quad Sets - Tighten up the muscle on the front of the thigh (Quad) and hold for 5-10 seconds.   Straight Leg Raises - With your knee straight (if you were given a brace, keep it on), lift the leg to 60 degrees, hold for 3 seconds, and slowly lower the leg.  Perform this exercise against resistance later as your leg gets stronger.  Leg Slides: Lying on your back, slowly slide your foot toward your buttocks, bending your knee up off the floor (only go as far as is comfortable). Then slowly slide your foot back down until your leg is flat on the floor again.  Angel Wings: Lying on your back spread your legs to the side as far apart as you can without causing discomfort.  Hamstring Strength:  Lying on your back, push your heel against the floor with your leg straight by tightening up the muscles of your buttocks.  Repeat, but this time bend your knee to a comfortable angle, and push your heel against the floor.  You may put a pillow under the heel to make it more comfortable if necessary.   A rehabilitation program following joint replacement surgery can speed recovery and prevent re-injury in the future due to weakened muscles. Contact your doctor or a physical therapist for more information on knee rehabilitation.    CONSTIPATION  Constipation is defined medically as fewer than three stools per week and severe constipation as less than one stool per week.  Even if you have a regular bowel pattern at home, your normal regimen is likely to be disrupted due to multiple reasons following surgery.  Combination of anesthesia,  postoperative narcotics, change in appetite and fluid intake all can affect  your bowels.   YOU MUST use at least one of the following options; they are listed in order of increasing strength to get the job done.  They are all available over the counter, and you may need to use some, POSSIBLY even all of these options:    Drink plenty of fluids (prune juice may be helpful) and high fiber foods Colace 100 mg by mouth twice a day  Senokot for constipation as directed and as needed Dulcolax (bisacodyl), take with full glass of water  Miralax (polyethylene glycol) once or twice a day as needed.  If you have tried all these things and are unable to have a bowel movement in the first 3-4 days after surgery call either your surgeon or your primary doctor.    If you experience loose stools or diarrhea, hold the medications until you stool forms back up.  If your symptoms do not get better within 1 week or if they get worse, check with your doctor.  If you experience "the worst abdominal pain ever" or develop nausea or vomiting, please contact the office immediately for further recommendations for treatment.   ITCHING:  If you experience itching with your medications, try taking only a single pain pill, or even half a pain pill at a time.  You can also use Benadryl over the counter for itching or also to help with sleep.   TED HOSE STOCKINGS:  Use stockings on both legs until for at least 2 weeks or as directed by physician office. They may be removed at night for sleeping.  MEDICATIONS:  See your medication summary on the "After Visit Summary" that nursing will review with you.  You may have some home medications which will be placed on hold until you complete the course of blood thinner medication.  It is important for you to complete the blood thinner medication as prescribed.  PRECAUTIONS:  If you experience chest pain or shortness of breath - call 911 immediately for transfer to the hospital emergency  department.   If you develop a fever greater that 101 F, purulent drainage from wound, increased redness or drainage from wound, foul odor from the wound/dressing, or calf pain - CONTACT YOUR SURGEON.                                                   FOLLOW-UP APPOINTMENTS:  If you do not already have a post-op appointment, please call the office for an appointment to be seen by your surgeon.  Guidelines for how soon to be seen are listed in your "After Visit Summary", but are typically between 1-4 weeks after surgery.  OTHER INSTRUCTIONS:   Knee Replacement:  Do not place pillow under knee, focus on keeping the knee straight while resting. CPM instructions: 0-90 degrees, 2 hours in the morning, 2 hours in the afternoon, and 2 hours in the evening. Place foam block, curve side up under heel at all times except when in CPM or when walking.  DO NOT modify, tear, cut, or change the foam block in any way.  POST-OPERATIVE OPIOID TAPER INSTRUCTIONS: It is important to wean off of your opioid medication as soon as possible. If you do not need pain medication after your surgery it is ok to stop day one. Opioids include: Codeine, Hydrocodone(Norco, Vicodin), Oxycodone(Percocet, oxycontin) and hydromorphone amongst others.  Long term and even short term use of opiods can cause: Increased pain response Dependence Constipation Depression Respiratory depression And more.  Withdrawal symptoms can include Flu like symptoms Nausea, vomiting And more Techniques to manage these symptoms Hydrate well Eat regular healthy meals Stay active Use relaxation techniques(deep breathing, meditating, yoga) Do Not substitute Alcohol to help with tapering If you have been on opioids for less than two weeks and do not have pain than it is ok to stop all together.  Plan to wean off of opioids This plan should start within one week post op of your joint replacement. Maintain the same interval or time between taking  each dose and first decrease the dose.  Cut the total daily intake of opioids by one tablet each day Next start to increase the time between doses. The last dose that should be eliminated is the evening dose.     MAKE SURE YOU:  Understand these instructions.  Get help right away if you are not doing well or get worse.    Thank you for letting us be a part of your medical care team.  It is a privilege we respect greatly.  We hope these instructions will help you stay on track for a fast and full recovery!   Increase activity slowly as tolerated   Complete by: As directed    Post-operative opioid taper instructions:   Complete by: As directed    POST-OPERATIVE OPIOID TAPER INSTRUCTIONS: It is important to wean off of your opioid medication as soon as possible. If you do not need pain medication after your surgery it is ok to stop day one. Opioids include: Codeine, Hydrocodone(Norco, Vicodin), Oxycodone(Percocet, oxycontin) and hydromorphone amongst others.  Long term and even short term use of opiods can cause: Increased pain response Dependence Constipation Depression Respiratory depression And more.  Withdrawal symptoms can include Flu like symptoms Nausea, vomiting And more Techniques to manage these symptoms Hydrate well Eat regular healthy meals Stay active Use relaxation techniques(deep breathing, meditating, yoga) Do Not substitute Alcohol to help with tapering If you have been on opioids for less than two weeks and do not have pain than it is ok to stop all together.  Plan to wean off of opioids This plan should start within one week post op of your joint replacement. Maintain the same interval or time between taking each dose and first decrease the dose.  Cut the total daily intake of opioids by one tablet each day Next start to increase the time between doses. The last dose that should be eliminated is the evening dose.           Follow-up Information      Marcene Corning, MD. Go on 03/11/2023.   Specialty: Orthopedic Surgery Why: Your appointment is scheduled for 10:45 Contact information: [address] [place] [address] [phone]         [facility] Go on 03/04/2023.   Why: Your appointment is scheduled for 2:30. Please arrive at 2:15 to complete your paperwork Contact information: Physical Therapy [address] [place] [address] [phone]                  Signed: Ginger Organ Ambriella Kitt 03/03/2023, 7:07 AM

## 2023-03-03 NOTE — Anesthesia Postprocedure Evaluation (Signed)
Anesthesia Post Note  Patient: [name]  Procedure(s) Performed: LEFT KNEE TOTAL KNEE ARTHROPLASTY (Left: Knee)     Patient location during evaluation: PACU Anesthesia Type: Spinal Level of consciousness: awake and alert Pain management: pain level controlled Vital Signs Assessment: post-procedure vital signs reviewed and stable Respiratory status: spontaneous breathing and respiratory function stable Cardiovascular status: blood pressure returned to baseline and stable Postop Assessment: spinal receding Anesthetic complications: no   No notable events documented.  Last Vitals:  Vitals:   03/03/23 0938 03/03/23 0943  BP: (!) 132/46 (!) 150/49  Pulse: 74 70  Resp: 17   Temp: 37.8 C   SpO2: 97%     Last Pain:  Vitals:   03/03/23 1026  TempSrc:   PainSc: 8                  Kennieth Rad

## 2023-03-03 NOTE — Plan of Care (Signed)

## 2023-03-04 LAB — GLUCOSE, CAPILLARY: Glucose-Capillary: 153 mg/dL — ABNORMAL HIGH (ref 70–99)
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
## 2023-03-04 NOTE — Plan of Care (Signed)
  Problem: Coping: Goal: Level of anxiety will decrease Outcome: Progressing   Problem: Pain Management: Goal: General experience of comfort will improve Outcome: Progressing   Problem: Safety: Goal: Ability to remain free from injury will improve Outcome: Progressing

## 2023-03-04 NOTE — Progress Notes (Signed)
Pt alert and oriented. Surgical dressing  clean, dry and intact. Pt / spouse has no questions or queries regarding discharge instructions. Belongings sent home with pt.

## 2023-03-04 NOTE — Progress Notes (Signed)
Subjective: 3 Days Post-Op Procedure(s) (LRB): LEFT KNEE TOTAL KNEE ARTHROPLASTY (Left)  Patient states that she thinks she should be able to go home today.   Activity level:  wbat Diet tolerance:  ok Voiding:  ok Patient reports pain as mild.    Objective: Vital signs in last 24 hours: Temp:  [98.8 F (37.1 C)-100.5 F (38.1 C)] 100.5 F (38.1 C) (12/13 0514) Pulse Rate:  [70-74] 73 (12/13 0514) Resp:  [17-18] 18 (12/13 0514) BP: (130-150)/(46-49) 130/46 (12/13 0514) SpO2:  [94 %-97 %] 94 % (12/13 0514)  Labs: Recent Labs    03/01/23 0834 03/02/23 0326 03/03/23 0331  HGB 10.2* 8.2* 9.6*   Recent Labs    03/02/23 0326 03/03/23 0331  WBC 3.7* 3.4*  RBC 2.79* 3.21*  HCT 25.1* 28.7*  PLT 110* 109*   Recent Labs    03/02/23 0326  NA 135  K 4.7  CL 102  CO2 23  BUN 45*  CREATININE 1.54*  GLUCOSE 165*  CALCIUM 8.8*   No results for input(s): "LABPT", "INR" in the last 72 hours.  Physical Exam:  Neurologically intact ABD soft Neurovascular intact Sensation intact distally Intact pulses distally Dorsiflexion/Plantar flexion intact Incision: dressing C/D/I and no drainage No cellulitis present Compartment soft  Assessment/Plan:  3 Days Post-Op Procedure(s) (LRB): LEFT KNEE TOTAL KNEE ARTHROPLASTY (Left) Advance diet Up with therapy Discharge home with home health after PT if cleared and doing well. Follow up in office 2 weeks post op. We will stop muscle relaxors due to sedation after here in the hospital. Continue on 81mg  asa BID for DVT prevention.   Ginger Organ Tova Vater 03/04/2023, 8:13 AM

## 2023-03-07 DIAGNOSIS — M25662 Stiffness of left knee, not elsewhere classified: Secondary | ICD-10-CM | POA: Diagnosis not present

## 2023-03-07 DIAGNOSIS — R262 Difficulty in walking, not elsewhere classified: Secondary | ICD-10-CM | POA: Diagnosis not present

## 2023-03-07 DIAGNOSIS — M25562 Pain in left knee: Secondary | ICD-10-CM | POA: Diagnosis not present

## 2023-03-09 DIAGNOSIS — M25662 Stiffness of left knee, not elsewhere classified: Secondary | ICD-10-CM | POA: Diagnosis not present

## 2023-03-09 DIAGNOSIS — M25562 Pain in left knee: Secondary | ICD-10-CM | POA: Diagnosis not present

## 2023-03-09 DIAGNOSIS — R262 Difficulty in walking, not elsewhere classified: Secondary | ICD-10-CM | POA: Diagnosis not present

## 2023-03-11 DIAGNOSIS — M25562 Pain in left knee: Secondary | ICD-10-CM | POA: Diagnosis not present

## 2023-03-17 DIAGNOSIS — M25662 Stiffness of left knee, not elsewhere classified: Secondary | ICD-10-CM | POA: Diagnosis not present

## 2023-03-17 DIAGNOSIS — R262 Difficulty in walking, not elsewhere classified: Secondary | ICD-10-CM | POA: Diagnosis not present

## 2023-03-17 DIAGNOSIS — M25562 Pain in left knee: Secondary | ICD-10-CM | POA: Diagnosis not present

## 2023-03-18 DIAGNOSIS — M25662 Stiffness of left knee, not elsewhere classified: Secondary | ICD-10-CM | POA: Diagnosis not present

## 2023-03-18 DIAGNOSIS — R262 Difficulty in walking, not elsewhere classified: Secondary | ICD-10-CM | POA: Diagnosis not present

## 2023-03-18 DIAGNOSIS — M25562 Pain in left knee: Secondary | ICD-10-CM | POA: Diagnosis not present

## 2023-03-22 DIAGNOSIS — M25662 Stiffness of left knee, not elsewhere classified: Secondary | ICD-10-CM | POA: Diagnosis not present

## 2023-03-22 DIAGNOSIS — R262 Difficulty in walking, not elsewhere classified: Secondary | ICD-10-CM | POA: Diagnosis not present

## 2023-03-22 DIAGNOSIS — M25562 Pain in left knee: Secondary | ICD-10-CM | POA: Diagnosis not present

## 2023-03-24 DIAGNOSIS — R262 Difficulty in walking, not elsewhere classified: Secondary | ICD-10-CM | POA: Diagnosis not present

## 2023-03-24 DIAGNOSIS — M25662 Stiffness of left knee, not elsewhere classified: Secondary | ICD-10-CM | POA: Diagnosis not present

## 2023-03-24 DIAGNOSIS — M25562 Pain in left knee: Secondary | ICD-10-CM | POA: Diagnosis not present

## 2023-03-29 DIAGNOSIS — R262 Difficulty in walking, not elsewhere classified: Secondary | ICD-10-CM | POA: Diagnosis not present

## 2023-03-29 DIAGNOSIS — M25662 Stiffness of left knee, not elsewhere classified: Secondary | ICD-10-CM | POA: Diagnosis not present

## 2023-03-29 DIAGNOSIS — M25562 Pain in left knee: Secondary | ICD-10-CM | POA: Diagnosis not present

## 2023-04-01 DIAGNOSIS — M25662 Stiffness of left knee, not elsewhere classified: Secondary | ICD-10-CM | POA: Diagnosis not present

## 2023-04-01 DIAGNOSIS — M25562 Pain in left knee: Secondary | ICD-10-CM | POA: Diagnosis not present

## 2023-04-01 DIAGNOSIS — R262 Difficulty in walking, not elsewhere classified: Secondary | ICD-10-CM | POA: Diagnosis not present

## 2023-04-04 DIAGNOSIS — R262 Difficulty in walking, not elsewhere classified: Secondary | ICD-10-CM | POA: Diagnosis not present

## 2023-04-04 DIAGNOSIS — M25662 Stiffness of left knee, not elsewhere classified: Secondary | ICD-10-CM | POA: Diagnosis not present

## 2023-04-04 DIAGNOSIS — M25562 Pain in left knee: Secondary | ICD-10-CM | POA: Diagnosis not present

## 2023-04-06 DIAGNOSIS — E1129 Type 2 diabetes mellitus with other diabetic kidney complication: Secondary | ICD-10-CM | POA: Diagnosis not present

## 2023-04-06 DIAGNOSIS — Z96652 Presence of left artificial knee joint: Secondary | ICD-10-CM | POA: Diagnosis not present

## 2023-04-06 DIAGNOSIS — I129 Hypertensive chronic kidney disease with stage 1 through stage 4 chronic kidney disease, or unspecified chronic kidney disease: Secondary | ICD-10-CM | POA: Diagnosis not present

## 2023-04-06 DIAGNOSIS — L509 Urticaria, unspecified: Secondary | ICD-10-CM | POA: Diagnosis not present

## 2023-04-06 DIAGNOSIS — G4733 Obstructive sleep apnea (adult) (pediatric): Secondary | ICD-10-CM | POA: Diagnosis not present

## 2023-04-08 DIAGNOSIS — M25662 Stiffness of left knee, not elsewhere classified: Secondary | ICD-10-CM | POA: Diagnosis not present

## 2023-04-08 DIAGNOSIS — R262 Difficulty in walking, not elsewhere classified: Secondary | ICD-10-CM | POA: Diagnosis not present

## 2023-04-08 DIAGNOSIS — M25562 Pain in left knee: Secondary | ICD-10-CM | POA: Diagnosis not present

## 2023-04-11 DIAGNOSIS — B349 Viral infection, unspecified: Secondary | ICD-10-CM | POA: Diagnosis not present

## 2023-04-11 DIAGNOSIS — I129 Hypertensive chronic kidney disease with stage 1 through stage 4 chronic kidney disease, or unspecified chronic kidney disease: Secondary | ICD-10-CM | POA: Diagnosis not present

## 2023-04-11 DIAGNOSIS — R5383 Other fatigue: Secondary | ICD-10-CM | POA: Diagnosis not present

## 2023-04-11 DIAGNOSIS — Z1152 Encounter for screening for COVID-19: Secondary | ICD-10-CM | POA: Diagnosis not present

## 2023-04-11 DIAGNOSIS — J029 Acute pharyngitis, unspecified: Secondary | ICD-10-CM | POA: Diagnosis not present

## 2023-04-11 DIAGNOSIS — R0981 Nasal congestion: Secondary | ICD-10-CM | POA: Diagnosis not present

## 2023-04-11 DIAGNOSIS — E1129 Type 2 diabetes mellitus with other diabetic kidney complication: Secondary | ICD-10-CM | POA: Diagnosis not present

## 2023-04-14 DIAGNOSIS — R262 Difficulty in walking, not elsewhere classified: Secondary | ICD-10-CM | POA: Diagnosis not present

## 2023-04-14 DIAGNOSIS — M25662 Stiffness of left knee, not elsewhere classified: Secondary | ICD-10-CM | POA: Diagnosis not present

## 2023-04-14 DIAGNOSIS — M25562 Pain in left knee: Secondary | ICD-10-CM | POA: Diagnosis not present

## 2023-04-29 DIAGNOSIS — M25662 Stiffness of left knee, not elsewhere classified: Secondary | ICD-10-CM | POA: Diagnosis not present

## 2023-04-29 DIAGNOSIS — M25562 Pain in left knee: Secondary | ICD-10-CM | POA: Diagnosis not present

## 2023-04-29 DIAGNOSIS — R262 Difficulty in walking, not elsewhere classified: Secondary | ICD-10-CM | POA: Diagnosis not present

## 2023-05-02 DIAGNOSIS — R262 Difficulty in walking, not elsewhere classified: Secondary | ICD-10-CM | POA: Diagnosis not present

## 2023-05-02 DIAGNOSIS — M25662 Stiffness of left knee, not elsewhere classified: Secondary | ICD-10-CM | POA: Diagnosis not present

## 2023-05-02 DIAGNOSIS — M25562 Pain in left knee: Secondary | ICD-10-CM | POA: Diagnosis not present

## 2023-05-04 ENCOUNTER — Inpatient Hospital Stay: Payer: Medicare Other | Attending: Hematology

## 2023-05-04 DIAGNOSIS — D696 Thrombocytopenia, unspecified: Secondary | ICD-10-CM | POA: Insufficient documentation

## 2023-05-04 DIAGNOSIS — D709 Neutropenia, unspecified: Secondary | ICD-10-CM | POA: Insufficient documentation

## 2023-05-04 DIAGNOSIS — D509 Iron deficiency anemia, unspecified: Secondary | ICD-10-CM | POA: Insufficient documentation

## 2023-05-06 DIAGNOSIS — R262 Difficulty in walking, not elsewhere classified: Secondary | ICD-10-CM | POA: Diagnosis not present

## 2023-05-06 DIAGNOSIS — M25662 Stiffness of left knee, not elsewhere classified: Secondary | ICD-10-CM | POA: Diagnosis not present

## 2023-05-06 DIAGNOSIS — M25562 Pain in left knee: Secondary | ICD-10-CM | POA: Diagnosis not present

## 2023-05-10 ENCOUNTER — Inpatient Hospital Stay: Payer: Medicare Other

## 2023-05-10 DIAGNOSIS — D709 Neutropenia, unspecified: Secondary | ICD-10-CM | POA: Diagnosis not present

## 2023-05-10 DIAGNOSIS — D696 Thrombocytopenia, unspecified: Secondary | ICD-10-CM | POA: Diagnosis not present

## 2023-05-10 DIAGNOSIS — M25562 Pain in left knee: Secondary | ICD-10-CM | POA: Diagnosis not present

## 2023-05-10 DIAGNOSIS — M25662 Stiffness of left knee, not elsewhere classified: Secondary | ICD-10-CM | POA: Diagnosis not present

## 2023-05-10 DIAGNOSIS — R262 Difficulty in walking, not elsewhere classified: Secondary | ICD-10-CM | POA: Diagnosis not present

## 2023-05-10 DIAGNOSIS — D509 Iron deficiency anemia, unspecified: Secondary | ICD-10-CM | POA: Diagnosis not present

## 2023-05-10 DIAGNOSIS — D500 Iron deficiency anemia secondary to blood loss (chronic): Secondary | ICD-10-CM

## 2023-05-10 LAB — CMP (CANCER CENTER ONLY)
ALT: 12 U/L (ref 0–44)
AST: 14 U/L — ABNORMAL LOW (ref 15–41)
Albumin: 4.1 g/dL (ref 3.5–5.0)
Alkaline Phosphatase: 105 U/L (ref 38–126)
Anion gap: 6 (ref 5–15)
BUN: 36 mg/dL — ABNORMAL HIGH (ref 8–23)
CO2: 27 mmol/L (ref 22–32)
Calcium: 9.4 mg/dL (ref 8.9–10.3)
Chloride: 106 mmol/L (ref 98–111)
Creatinine: 1.25 mg/dL — ABNORMAL HIGH (ref 0.44–1.00)
GFR, Estimated: 46 mL/min — ABNORMAL LOW (ref >60)
Glucose, Bld: 150 mg/dL — ABNORMAL HIGH (ref 70–99)
Potassium: 4 mmol/L (ref 3.5–5.1)
Sodium: 139 mmol/L (ref 135–145)
Total Bilirubin: 0.4 mg/dL (ref 0.0–1.2)
Total Protein: 7 g/dL (ref 6.5–8.1)

## 2023-05-10 LAB — CBC WITH DIFFERENTIAL (CANCER CENTER ONLY)
Abs Immature Granulocytes: 0.02 10*3/uL (ref 0.00–0.07)
Basophils Absolute: 0 10*3/uL (ref 0.0–0.1)
Basophils Relative: 0 %
Eosinophils Absolute: 0 10*3/uL (ref 0.0–0.5)
Eosinophils Relative: 0 %
HCT: 31.8 % — ABNORMAL LOW (ref 36.0–46.0)
Hemoglobin: 10.7 g/dL — ABNORMAL LOW (ref 12.0–15.0)
Immature Granulocytes: 1 %
Lymphocytes Relative: 36 %
Lymphs Abs: 0.7 10*3/uL (ref 0.7–4.0)
MCH: 29.6 pg (ref 26.0–34.0)
MCHC: 33.6 g/dL (ref 30.0–36.0)
MCV: 87.8 fL (ref 80.0–100.0)
Monocytes Absolute: 0.2 10*3/uL (ref 0.1–1.0)
Monocytes Relative: 10 %
Neutro Abs: 1.1 10*3/uL — ABNORMAL LOW (ref 1.7–7.7)
Neutrophils Relative %: 53 %
Platelet Count: 112 10*3/uL — ABNORMAL LOW (ref 150–400)
RBC: 3.62 MIL/uL — ABNORMAL LOW (ref 3.87–5.11)
RDW: 13.6 % (ref 11.5–15.5)
WBC Count: 2.1 10*3/uL — ABNORMAL LOW (ref 4.0–10.5)
nRBC: 0 % (ref 0.0–0.2)

## 2023-05-10 LAB — RETIC PANEL
Immature Retic Fract: 13.6 % (ref 2.3–15.9)
RBC.: 3.61 MIL/uL — ABNORMAL LOW (ref 3.87–5.11)
Retic Count, Absolute: 71.5 10*3/uL (ref 19.0–186.0)
Retic Ct Pct: 2 % (ref 0.4–3.1)
Reticulocyte Hemoglobin: 32.5 pg (ref >27.9)

## 2023-05-10 LAB — IRON AND IRON BINDING CAPACITY (CC-WL,HP ONLY)
Iron: 60 ug/dL (ref 28–170)
Saturation Ratios: 20 % (ref 10.4–31.8)
TIBC: 294 ug/dL (ref 250–450)
UIBC: 234 ug/dL (ref 148–442)

## 2023-05-11 ENCOUNTER — Ambulatory Visit: Payer: Medicare Other | Admitting: Hematology and Oncology

## 2023-05-11 LAB — FERRITIN: Ferritin: 67 ng/mL (ref 11–307)

## 2023-05-12 DIAGNOSIS — M25562 Pain in left knee: Secondary | ICD-10-CM | POA: Diagnosis not present

## 2023-05-12 DIAGNOSIS — M25662 Stiffness of left knee, not elsewhere classified: Secondary | ICD-10-CM | POA: Diagnosis not present

## 2023-05-12 DIAGNOSIS — R262 Difficulty in walking, not elsewhere classified: Secondary | ICD-10-CM | POA: Diagnosis not present

## 2023-05-19 ENCOUNTER — Ambulatory Visit: Payer: Medicare Other | Admitting: Hematology and Oncology

## 2023-05-19 ENCOUNTER — Other Ambulatory Visit: Payer: Self-pay | Admitting: Physician Assistant

## 2023-05-19 DIAGNOSIS — D500 Iron deficiency anemia secondary to blood loss (chronic): Secondary | ICD-10-CM

## 2023-05-19 DIAGNOSIS — D61818 Other pancytopenia: Secondary | ICD-10-CM

## 2023-05-27 ENCOUNTER — Inpatient Hospital Stay: Payer: Medicare Other | Attending: Hematology | Admitting: Physician Assistant

## 2023-07-26 ENCOUNTER — Encounter: Payer: Self-pay | Admitting: Physician Assistant

## 2023-07-26 DIAGNOSIS — Z794 Long term (current) use of insulin: Secondary | ICD-10-CM | POA: Diagnosis not present

## 2023-07-26 DIAGNOSIS — N184 Chronic kidney disease, stage 4 (severe): Secondary | ICD-10-CM | POA: Diagnosis not present

## 2023-07-26 DIAGNOSIS — E11621 Type 2 diabetes mellitus with foot ulcer: Secondary | ICD-10-CM | POA: Diagnosis not present

## 2023-07-26 DIAGNOSIS — L509 Urticaria, unspecified: Secondary | ICD-10-CM | POA: Diagnosis not present

## 2023-07-26 DIAGNOSIS — E785 Hyperlipidemia, unspecified: Secondary | ICD-10-CM | POA: Diagnosis not present

## 2023-07-26 DIAGNOSIS — E1129 Type 2 diabetes mellitus with other diabetic kidney complication: Secondary | ICD-10-CM | POA: Diagnosis not present

## 2023-07-26 DIAGNOSIS — E0590 Thyrotoxicosis, unspecified without thyrotoxic crisis or storm: Secondary | ICD-10-CM | POA: Diagnosis not present

## 2023-07-26 DIAGNOSIS — E039 Hypothyroidism, unspecified: Secondary | ICD-10-CM | POA: Diagnosis not present

## 2023-07-26 DIAGNOSIS — I130 Hypertensive heart and chronic kidney disease with heart failure and stage 1 through stage 4 chronic kidney disease, or unspecified chronic kidney disease: Secondary | ICD-10-CM | POA: Diagnosis not present

## 2023-07-26 DIAGNOSIS — I5032 Chronic diastolic (congestive) heart failure: Secondary | ICD-10-CM | POA: Diagnosis not present

## 2023-07-26 DIAGNOSIS — M1990 Unspecified osteoarthritis, unspecified site: Secondary | ICD-10-CM | POA: Diagnosis not present

## 2023-07-26 DIAGNOSIS — F339 Major depressive disorder, recurrent, unspecified: Secondary | ICD-10-CM | POA: Diagnosis not present

## 2023-07-28 ENCOUNTER — Ambulatory Visit: Admitting: Physician Assistant

## 2023-07-28 DIAGNOSIS — R3129 Other microscopic hematuria: Secondary | ICD-10-CM | POA: Diagnosis not present

## 2023-07-28 DIAGNOSIS — I129 Hypertensive chronic kidney disease with stage 1 through stage 4 chronic kidney disease, or unspecified chronic kidney disease: Secondary | ICD-10-CM | POA: Diagnosis not present

## 2023-07-28 DIAGNOSIS — N3020 Other chronic cystitis without hematuria: Secondary | ICD-10-CM | POA: Diagnosis not present

## 2023-07-28 DIAGNOSIS — N1832 Chronic kidney disease, stage 3b: Secondary | ICD-10-CM | POA: Diagnosis not present

## 2023-07-28 DIAGNOSIS — R311 Benign essential microscopic hematuria: Secondary | ICD-10-CM | POA: Diagnosis not present

## 2023-07-28 DIAGNOSIS — N130 Hydronephrosis with ureteropelvic junction obstruction: Secondary | ICD-10-CM | POA: Diagnosis not present

## 2023-07-28 DIAGNOSIS — N2581 Secondary hyperparathyroidism of renal origin: Secondary | ICD-10-CM | POA: Diagnosis not present

## 2023-07-28 DIAGNOSIS — D631 Anemia in chronic kidney disease: Secondary | ICD-10-CM | POA: Diagnosis not present

## 2023-07-28 DIAGNOSIS — N200 Calculus of kidney: Secondary | ICD-10-CM | POA: Diagnosis not present

## 2023-07-29 ENCOUNTER — Encounter: Payer: Self-pay | Admitting: Podiatrist

## 2023-07-29 ENCOUNTER — Ambulatory Visit (INDEPENDENT_AMBULATORY_CARE_PROVIDER_SITE_OTHER): Admitting: Podiatrist

## 2023-07-29 VITALS — Ht 60.0 in | Wt 202.0 lb

## 2023-07-29 DIAGNOSIS — L97511 Non-pressure chronic ulcer of other part of right foot limited to breakdown of skin: Secondary | ICD-10-CM | POA: Diagnosis not present

## 2023-07-29 NOTE — Progress Notes (Signed)
 Subjective:  Patient ID: [name], female    DOB: 27-Feb-1951,  MRN: [id number]  Chief Complaint  Patient presents with   Diabetes    NP-diabetic with ulcer on bottom of right hallux toe    73 y.o. female presents for an ulcer on the bottom of the right great toe.  Relates she is diabetic and noticed the area draining about a week ago.  States she noticed the area started as a blister or callus and broke open.  Review of Systems: Negative except as noted in the HPI. Denies N/V/F/Ch.  Past Medical History:  Diagnosis Date   Anemia    Arthritis    Asthma    OCCAS   Bell's palsy    Broken neck (HCC)    C-1   Cancer (HCC)    left ear squamus cell   Carbuncle and furuncle of trunk    Chronic dislocation of right shoulder    Depression    Disorder of fascia    HX OF NECROTIC FASCITIS AFTER ABDOMINAL SURGERY FOR HERNIA- REQUIRED 19 SURGERIES AND 2.5 MONTH HOSPITALIZATION AT [facility]   DM (diabetes mellitus) (HCC)    Dysrhythmia    HX OF TACHYCARDIA AND BRADYCARDIA - ON GOING FOR YEARS - DOES NOT HAVE TO SEE CARDIOLOGIST   Elevated cholesterol    Fibromyalgia    Fracture 07/2012   HX OF FRACTURED NECK C1- CAUSES SEVERE HEADACHES--LIMITED ROM NECK   Frequent infections    ESPECIALLY PRONE TO INFECTIONS AFTER SURGERIES   Graves disease    Heart murmur    DOES NOT CAUSE ANY PROBLEMS   History of kidney stones    Hyperlipidemia    Hypertension    Hypothyroidism    GRAVES DISEASE   Migraine    Nephrolithiasis    STAGE 3    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   Neuropathy    Obesity    OSA (obstructive sleep apnea)    USES CPAP - DOES NOT KNOW SETTING   Pain    LEFT SHOULDER  PAIN -HARD TO LIE ON LEFT SIDE FOR LONG PERIOD;  PAIN IN LOWER BADK - 3 HERNIATED DISCS AND STENOISIS -   Pneumonia    PONV (postoperative nausea and vomiting)    THE GAS MAKES ME NAUSEATED   Restless leg syndrome    Sciatica    Tachycardia    Urinary frequency    Urticaria    UTI (urinary tract  infection)    Outpatient Encounter Medications as of 07/29/2023  Medication Sig Note   acetaminophen  (TYLENOL ) 500 MG tablet Take 2 tablets (1,000 mg total) by mouth every 6 (six) hours as needed for mild pain (or Fever >/= 101).    amLODipine  (NORVASC ) 5 MG tablet Take 5 mg by mouth at bedtime.    aspirin  EC 81 MG tablet Take 1 tablet (81 mg total) by mouth 2 (two) times daily after a meal. For 2 weeks then once a day for 2 weeks for DVT prevention.    BD INSULIN  SYRINGE U/F 31G X 5/16" 0.5 ML MISC USE 5 TIMES A DAY LEVEMIR  AND NOVOLOG     BD PEN NEEDLE MICRO U/F 32G X 6 MM MISC Inject 32 pens into the skin daily.    Cholecalciferol  (DIALYVITE VITAMIN D 5000) 125 MCG (5000 UT) capsule Take 5,000 Units by mouth daily.    clonazePAM  (KLONOPIN ) 0.5 MG tablet Take 0.5 mg by mouth See admin instructions. Take 0.5 mg at bedtime, may take  an additional 0.5 mg tablet twice daily as needed for anxiety    CREON  36000-114000 units CPEP capsule Take 36,000 Units by mouth 3 (three) times daily with meals.    doxazosin  (CARDURA ) 2 MG tablet Take 2 mg by mouth daily.    fenofibrate  54 MG tablet Take 54 mg by mouth daily with supper.    FLUoxetine  (PROZAC ) 20 MG capsule Take 40 mg by mouth at bedtime.    fluticasone  (FLONASE ) 50 MCG/ACT nasal spray Place 2 sprays into both nostrils daily as needed for allergies or rhinitis.    folic acid  (FOLVITE ) 1 MG tablet Take 1 mg by mouth daily.    FREESTYLE TEST STRIPS test strip USE STRIPS TO TEST BLOOD GLUCOSE BID    furosemide  (LASIX ) 40 MG tablet Take 40 mg by mouth daily.    insulin  aspart (NOVOLOG ) 100 UNIT/ML FlexPen Sliding scale dose: CBG < 70: implement hypoglycemia protocol  CBG 70 - 120: 0 units  CBG 121 - 150: 0 units  CBG 151 - 200: 0 units  CBG 201 - 250: 2 units  CBG 251 - 300: 3 units  CBG 301 - 350: 4 units  CBG 351 - 400: 5 units (Patient taking differently: Inject 20-25 Units into the skin 2 (two) times daily as needed for high blood sugar.)     Insulin  Glargine (BASAGLAR  KWIKPEN) 100 UNIT/ML Inject 22 Units into the skin 2 (two) times daily.    Lancets Misc. (UNISTIK 3 COMFORT) MISC USE LANCETS TO CHECK BLOOD GLUCOSE BID    metoprolol  tartrate (LOPRESSOR ) 50 MG tablet Take 50 mg by mouth 2 (two) times daily.    mupirocin  ointment (BACTROBAN ) 2 % Apply 1 Application topically 2 (two) times daily.    NON FORMULARY Pt uses a c-pap nightly    nutrition supplement, JUVEN, (JUVEN) PACK Take 1 packet by mouth 2 (two) times daily between meals. 02/21/2023: On hold   omeprazole (PRILOSEC) 20 MG capsule Take 20 mg by mouth daily with supper.     ondansetron  (ZOFRAN -ODT) 4 MG disintegrating tablet Take 1 tablet (4 mg total) by mouth every 6 (six) hours as needed for nausea or vomiting.    oxybutynin  (DITROPAN -XL) 10 MG 24 hr tablet Take 10 mg by mouth daily.    oxyCODONE -acetaminophen  (PERCOCET) 5-325 MG tablet Take 1-2 tablets by mouth every 6 (six) hours as needed for severe pain (pain score 7-10) or moderate pain (pain score 4-6) (post op pain).    propylthiouracil  (PTU) 50 MG tablet Take 50 mg by mouth daily.    rOPINIRole  (REQUIP ) 2 MG tablet Take 2-3 mg by mouth See admin instructions. Take 2 mg by mouth in the morning and take 3 mg in the evening    tiZANidine  (ZANAFLEX ) 4 MG tablet Take 1 tablet (4 mg total) by mouth at bedtime.    VENTOLIN  HFA 108 (90 Base) MCG/ACT inhaler Inhale 2 puffs into the lungs every 4 (four) hours as needed for shortness of breath.    gabapentin  (NEURONTIN ) 300 MG capsule Take 1 capsule (300 mg total) by mouth 3 (three) times daily for 30 days.    No facility-administered encounter medications on file as of 07/29/2023.    Social History   Tobacco Use  Smoking Status Never   Passive exposure: Past  Smokeless Tobacco Never    Allergies  Allergen Reactions   Morphine  Other (See Comments)    Severe headaches  Terrible headaches    Severe headaches    Terrible headaches Severe headaches  Promethazine Hcl  Other (See Comments)    Makes restless leg syndrome worse    Restless leg feeling all over body   Sumatriptan Anaphylaxis and Other (See Comments)    Vascular spasms  Other Reaction(s): Other  Facial spasms, Vasospasms  Other Reaction(s): Other (See Comments)  Facial spasms Vasospasms    Vascular spasms  Other Reaction(s): Other  Facial spasms, Vasospasms    Facial spasms, Vasospasms   Codeine Nausea And Vomiting, Nausea Only and Other (See Comments)    Other Reaction(s): GI Intolerance  Other Reaction(s): Abdominal Pain   Hydrocodone -Acetaminophen  Nausea And Vomiting    Other Reaction(s): GI Intolerance  Other Reaction(s): Other (See Comments)   Misc. Sulfonamide Containing Compounds Hives and Rash    Childhood allergy   Nsaids Other (See Comments)    PT UNABLE TO TOLERATE NSAID'S DRUGS DUE TO HX OF GASTRIC SLEEVE SURGERY  Other Reaction(s): Other (See Comments)   Statins Other (See Comments)    Leg/muscle pain   Sulfonamide Derivatives Hives    Childhood allergy    Methimazole Hives   Niacin     heart pounding and beating fast.  Other Reaction(s): Unknown   Objective:  There were no vitals filed for this visit. Body mass index is 39.45 kg/m. Constitutional Well developed. Well nourished.  Vascular Dorsalis pedis pulses palpable bilaterally. Posterior tibial pulses palpable bilaterally. Capillary refill normal to all digits.  No cyanosis or clubbing noted. Pedal hair growth normal.  Neurologic Normal speech. Oriented to person, place, and time. Protective sensation absent  Dermatologic Wound Location: Rt. foot right, hallux Wound Base: Granular/Healthy Peri-wound: Calloused Exudate: None: wound tissue dry Wound Measurements: -1.3 cm x 1.0 cm x 0.2cm   Orthopedic: No pain to palpation either foot.   Post debridement photo   Assessment:   1. Ulcer of great toe, right, limited to breakdown of skin [facility])    Plan:  Patient was evaluated and treated  and all questions answered.  Ulcer right hallux  -Debridement as below. -Dressed with iodosorb, DSD. - off-loading with surgical shoe.- surgical shoe dispensed today   Procedure: Excisional Debridement of Wound Tool: Sharp chisel blade/tissue nipper Rationale: Removal of non-viable soft tissue from the wound to promote healing.  Anesthesia: none Type of Debridement: Sharp Excisional Tissue Removed: Non-viable soft tissue Blood loss: Minimal (<50cc) Depth of Debridement: subcutaneous tissue. Technique: Sharp excisional debridement to bleeding, viable wound base.  Dressing: Dry, sterile, compression dressing. Disposition: Patient tolerated procedure well. Patient to return in 10 days for follow up.  If any redness, swelling drainage or sign of infection arises she will call.  Iodosorb ordered for her through [facility]

## 2023-07-29 NOTE — Patient Instructions (Signed)
 DRESSING CHANGES RIGHT FOOT:        CLEANSE ULCER WITH SALINE.   DAB DRY WITH GAUZE SPONGE.   APPLY A LIGHT AMOUNT OF IODOSORB (I WILL ORDER YOU A TUBE AS WELL THROUGH A MAIL ORDER MEDICAL SUPPLY STORE CALLED [facility] )  TO BASE OF ULCER.   APPLY OUTER DRESSING/BAND-AID AS INSTRUCTED.   WEAR SURGICAL SHOE DAILY AT ALL TIMES.   DO NOT WALK BAREFOOT!!!   IF YOU EXPERIENCE ANY FEVER, CHILLS, NIGHTSWEATS, NAUSEA OR VOMITING, ELEVATED OR LOW BLOOD SUGARS, REPORT TO EMERGENCY ROOM.   IF YOU EXPERIENCE INCREASED REDNESS, PAIN, SWELLING, DISCOLORATION, ODOR, PUS, DRAINAGE OR WARMTH OF YOUR FOOT, REPORT TO EMERGENCY ROOM.

## 2023-08-01 ENCOUNTER — Encounter: Payer: Self-pay | Admitting: Physician Assistant

## 2023-08-10 ENCOUNTER — Encounter: Payer: Self-pay | Admitting: Podiatrist

## 2023-08-10 ENCOUNTER — Ambulatory Visit (INDEPENDENT_AMBULATORY_CARE_PROVIDER_SITE_OTHER): Admitting: Podiatrist

## 2023-08-10 DIAGNOSIS — L97511 Non-pressure chronic ulcer of other part of right foot limited to breakdown of skin: Secondary | ICD-10-CM

## 2023-08-10 NOTE — Progress Notes (Signed)
 Chief Complaint  Patient presents with   Diabetic Ulcer    "I don't know if it's doing any better.  It still hurts."     HPI: Patient is 73 y.o. female who presents today for follow up ulcer bottom of right great toe.  She has been using the iodosorb and bandage. She also states she is using her surgical shoe but [place].       Review of systems is negative except as noted in the HPI.  Denies nausea/ vomiting/ fevers/ chills or night sweats.   Denies difficulty breathing, denies calf pain or tenderness  Physical Exam Constitutional Well developed. Well nourished.  Vascular Dorsalis pedis pulses palpable bilaterally. Posterior tibial pulses palpable bilaterally. Capillary refill normal to all digits.  No cyanosis or clubbing noted. Pedal hair growth normal.  Neurologic Normal speech. Oriented to person, place, and time. Protective sensation absent  Dermatologic Wound Location: Rt. foot right, hallux Wound Base: Granular/Healthy Peri-wound: Calloused Exudate: None: wound tissue dry Wound Measurements: -0.5 cm x 1.0 cm x 0.1cm  see photo from today  Orthopedic: No pain to palpation either foot.   Photo from 07/29/23   Photo from 08/10/23  Assessment:   ICD-10-CM   1. Ulcer of great toe, right, limited to breakdown of skin (HCC)  L97.511        Plan: Pared the ulcer with a 15 blade. Recommended she continue to use the iodosorb gel.  I also dispensed a silicone toe protector to offload the toe. Will continue surgical shoe wear.  This should go on to heal in the next 2 weeks.  If there is still pain or the area is not healed, she will call.

## 2023-08-23 DIAGNOSIS — L7682 Other postprocedural complications of skin and subcutaneous tissue: Secondary | ICD-10-CM | POA: Diagnosis not present

## 2023-08-23 DIAGNOSIS — M7989 Other specified soft tissue disorders: Secondary | ICD-10-CM | POA: Diagnosis not present

## 2023-08-25 ENCOUNTER — Ambulatory Visit (INDEPENDENT_AMBULATORY_CARE_PROVIDER_SITE_OTHER): Admitting: Podiatry

## 2023-08-25 DIAGNOSIS — L97511 Non-pressure chronic ulcer of other part of right foot limited to breakdown of skin: Secondary | ICD-10-CM

## 2023-08-25 NOTE — Progress Notes (Signed)
  Presents with complaint of pain on the great toe right.  Seems little swollen.  Has not noticed any drainage or significant redness.  Has a previous ulceration at the spot before.  Physical Exam:  Patient alert and oriented x 3.  No complaints of nausea, vomiting, fever, or chills  Vascular: DP pulses 2/4 bilateral. PT pulses 2/4 lateral.  Moderate edema. Capillary fill time immediate.  Dermatologic: Superficial ulcer just penetrating into the dermis great toe right.. Measures 6 mm wide x 2 mm long x 1 deep.  Mild clear drainage.  No erythema.  Mild exudate.  No undermining.  Granulation tissue.  Signs of infection  Neurologic:   Musculoskeletal: Mild to moderate HAV deformity right  Radiographs: None  Diagnoses: 1.  Superficial ulcer just penetrating the dermis great toe right foot with no signs of infection.  Plan: -Sharply debrided Wagner grade 2 ulcer great toe right.  Sharply debrided any devitalized tissue down to good bleeding base.  Applied triple antibiotic and gauze dressing. -Wound care: Soak warm Epsom salt water 15 minutes twice daily.  Apply Bactroban  ointment and a light dressing.  She has a surgical shoe wear this for all weightbearing. -Watch for any signs of infection or worsening   Return 1 weeks f/u ulcer if not healed

## 2023-08-29 ENCOUNTER — Ambulatory Visit (INDEPENDENT_AMBULATORY_CARE_PROVIDER_SITE_OTHER): Admitting: Podiatry

## 2023-08-29 DIAGNOSIS — L97512 Non-pressure chronic ulcer of other part of right foot with fat layer exposed: Secondary | ICD-10-CM

## 2023-08-29 NOTE — Progress Notes (Signed)
 Patient presents with follow-up ulcer hallux right.  Still visible tenderness with it.  Says she has been doing  Has not been wearing the surgical shoe because it aggravates her hip.  She has noticed clear drainage.  Physical exam:  Alert and oriented x 3.  No complaints of nausea, vomiting, fever, or chills  Vascular: DP pulses 2/4 bilateral. PT pulses 2/4 lateral.  Moderate edema. Capillary fill time immediate.  Dermatologic: Full-thickness ulcer plantar hallux right penetrating the subcutaneous tissue.   . Measures 2 mm wide x 3 mm long x 3 deep.  Clear drainage.  No erythema.  Mild exudate.  No undermining.  Base of granulation tissue.  Neurologic:   Musculoskeletal: Tenderness plantar aspect hallux right.  Radiographs: None  Diagnoses: 1.  Full-thickness ulcer penetrating the subcutaneous tissue plantar hallux.  Plan: -Sharp debridement full-thickness ulcer into the subcutaneous tissue hallux.  Sharply debrided any devitalized tissue down to this.  Apply triple antibiotic.  And a light dressing. -Continue current wound care.  Soak foot in warm Epsom salt water 15 minutes, apply Bactroban  ointment, and apply light dressing.     Return 2 weeks f/u ulcer

## 2023-09-13 ENCOUNTER — Ambulatory Visit: Admitting: Physician Assistant

## 2023-09-13 DIAGNOSIS — M7989 Other specified soft tissue disorders: Secondary | ICD-10-CM | POA: Diagnosis not present

## 2023-09-14 DIAGNOSIS — R319 Hematuria, unspecified: Secondary | ICD-10-CM | POA: Diagnosis not present

## 2023-09-20 ENCOUNTER — Ambulatory Visit: Admitting: Physician Assistant

## 2023-09-20 ENCOUNTER — Ambulatory Visit

## 2023-10-12 DIAGNOSIS — R0602 Shortness of breath: Secondary | ICD-10-CM | POA: Diagnosis not present

## 2023-10-12 DIAGNOSIS — I5032 Chronic diastolic (congestive) heart failure: Secondary | ICD-10-CM | POA: Diagnosis not present

## 2023-10-12 DIAGNOSIS — R34 Anuria and oliguria: Secondary | ICD-10-CM | POA: Diagnosis not present

## 2023-10-12 DIAGNOSIS — I130 Hypertensive heart and chronic kidney disease with heart failure and stage 1 through stage 4 chronic kidney disease, or unspecified chronic kidney disease: Secondary | ICD-10-CM | POA: Diagnosis not present

## 2023-10-12 DIAGNOSIS — G4733 Obstructive sleep apnea (adult) (pediatric): Secondary | ICD-10-CM | POA: Diagnosis not present

## 2023-10-12 DIAGNOSIS — D509 Iron deficiency anemia, unspecified: Secondary | ICD-10-CM | POA: Diagnosis not present

## 2023-10-12 DIAGNOSIS — N184 Chronic kidney disease, stage 4 (severe): Secondary | ICD-10-CM | POA: Diagnosis not present

## 2023-10-12 DIAGNOSIS — D61818 Other pancytopenia: Secondary | ICD-10-CM | POA: Diagnosis not present

## 2023-10-14 ENCOUNTER — Telehealth: Payer: Self-pay | Admitting: Hematology and Oncology

## 2023-10-14 DIAGNOSIS — M948X9 Other specified disorders of cartilage, unspecified sites: Secondary | ICD-10-CM | POA: Diagnosis not present

## 2023-10-14 NOTE — Telephone Encounter (Signed)
 Jacqulyn returned my call from the voicemail I left with her husband. Miche has updated her telephone number and email address. I asked Malania's preference in seeing Dr. Federico or Johnston and Hildegard stated that she would like to see Dr. Federico. Gerrianne is scheduled to see Dr. Federico on 8/1.

## 2023-10-21 ENCOUNTER — Inpatient Hospital Stay

## 2023-10-21 ENCOUNTER — Inpatient Hospital Stay: Attending: Hematology and Oncology | Admitting: Hematology and Oncology

## 2023-10-21 VITALS — BP 149/68 | HR 69 | Temp 98.2°F | Resp 20 | Wt 239.3 lb

## 2023-10-21 DIAGNOSIS — D61818 Other pancytopenia: Secondary | ICD-10-CM

## 2023-10-21 DIAGNOSIS — R161 Splenomegaly, not elsewhere classified: Secondary | ICD-10-CM

## 2023-10-21 DIAGNOSIS — D509 Iron deficiency anemia, unspecified: Secondary | ICD-10-CM | POA: Insufficient documentation

## 2023-10-21 DIAGNOSIS — Z9884 Bariatric surgery status: Secondary | ICD-10-CM | POA: Diagnosis not present

## 2023-10-21 DIAGNOSIS — D500 Iron deficiency anemia secondary to blood loss (chronic): Secondary | ICD-10-CM | POA: Diagnosis not present

## 2023-10-21 DIAGNOSIS — D696 Thrombocytopenia, unspecified: Secondary | ICD-10-CM | POA: Insufficient documentation

## 2023-10-21 DIAGNOSIS — D72819 Decreased white blood cell count, unspecified: Secondary | ICD-10-CM | POA: Insufficient documentation

## 2023-10-21 LAB — CBC WITH DIFFERENTIAL (CANCER CENTER ONLY)
Abs Immature Granulocytes: 0.01 K/uL (ref 0.00–0.07)
Basophils Absolute: 0 K/uL (ref 0.0–0.1)
Basophils Relative: 0 %
Eosinophils Absolute: 0 K/uL (ref 0.0–0.5)
Eosinophils Relative: 0 %
HCT: 30.9 % — ABNORMAL LOW (ref 36.0–46.0)
Hemoglobin: 10.1 g/dL — ABNORMAL LOW (ref 12.0–15.0)
Immature Granulocytes: 1 %
Lymphocytes Relative: 42 %
Lymphs Abs: 0.7 K/uL (ref 0.7–4.0)
MCH: 27.8 pg (ref 26.0–34.0)
MCHC: 32.7 g/dL (ref 30.0–36.0)
MCV: 85.1 fL (ref 80.0–100.0)
Monocytes Absolute: 0.2 K/uL (ref 0.1–1.0)
Monocytes Relative: 9 %
Neutro Abs: 0.8 K/uL — ABNORMAL LOW (ref 1.7–7.7)
Neutrophils Relative %: 48 %
Platelet Count: 107 K/uL — ABNORMAL LOW (ref 150–400)
RBC: 3.63 MIL/uL — ABNORMAL LOW (ref 3.87–5.11)
RDW: 14.5 % (ref 11.5–15.5)
WBC Count: 1.7 K/uL — ABNORMAL LOW (ref 4.0–10.5)
nRBC: 0 % (ref 0.0–0.2)

## 2023-10-21 LAB — IRON AND IRON BINDING CAPACITY (CC-WL,HP ONLY)
Iron: 66 ug/dL (ref 28–170)
Saturation Ratios: 20 % (ref 10.4–31.8)
TIBC: 325 ug/dL (ref 250–450)
UIBC: 259 ug/dL (ref 148–442)

## 2023-10-21 LAB — FERRITIN: Ferritin: 58 ng/mL (ref 11–307)

## 2023-10-21 LAB — VITAMIN B12: Vitamin B-12: 1992 pg/mL — ABNORMAL HIGH (ref 180–914)
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
## 2023-10-23 ENCOUNTER — Encounter: Payer: Self-pay | Admitting: Physician Assistant

## 2023-10-24 ENCOUNTER — Telehealth: Payer: Self-pay

## 2023-10-24 NOTE — Telephone Encounter (Signed)
-----   Message from Rosario JAYSON Kidney sent at 10/24/2023  2:26 PM EDT ----- Genna will do. Pod B triage, please get this patient scheduled with any available provider for follow up of her liver disease (suspected cirrhosis) ----- Message ----- From: Kidney Norleen ONEIDA MADISON, MD Sent: 10/23/2023   8:57 PM EDT To: Rosario JAYSON Kidney, MD  This is a former patient of Dr. Teressa who has been lost to follow-up.  Would you please connect her with one of your partners regarding her cirrhosis.  --G[place]y

## 2023-10-25 NOTE — Telephone Encounter (Signed)
 Called and spoke with patient. Patient has been scheduled for a follow up with Deanna May, NP tomorrow at 8:30 am. Patient did not want a September appointment and she will be out of town next week until 10/1723.

## 2023-10-26 ENCOUNTER — Ambulatory Visit (INDEPENDENT_AMBULATORY_CARE_PROVIDER_SITE_OTHER): Admitting: Gastroenterology

## 2023-10-26 ENCOUNTER — Encounter: Payer: Self-pay | Admitting: Gastroenterology

## 2023-10-26 ENCOUNTER — Other Ambulatory Visit (INDEPENDENT_AMBULATORY_CARE_PROVIDER_SITE_OTHER)

## 2023-10-26 VITALS — BP 130/75 | HR 68 | Ht 60.0 in | Wt 248.4 lb

## 2023-10-26 DIAGNOSIS — D696 Thrombocytopenia, unspecified: Secondary | ICD-10-CM

## 2023-10-26 DIAGNOSIS — K7581 Nonalcoholic steatohepatitis (NASH): Secondary | ICD-10-CM | POA: Diagnosis not present

## 2023-10-26 DIAGNOSIS — R160 Hepatomegaly, not elsewhere classified: Secondary | ICD-10-CM | POA: Diagnosis not present

## 2023-10-26 DIAGNOSIS — D509 Iron deficiency anemia, unspecified: Secondary | ICD-10-CM | POA: Diagnosis not present

## 2023-10-26 DIAGNOSIS — M65331 Trigger finger, right middle finger: Secondary | ICD-10-CM | POA: Diagnosis not present

## 2023-10-26 DIAGNOSIS — K862 Cyst of pancreas: Secondary | ICD-10-CM

## 2023-10-26 DIAGNOSIS — R161 Splenomegaly, not elsewhere classified: Secondary | ICD-10-CM | POA: Diagnosis not present

## 2023-10-26 DIAGNOSIS — K760 Fatty (change of) liver, not elsewhere classified: Secondary | ICD-10-CM

## 2023-10-26 DIAGNOSIS — M79641 Pain in right hand: Secondary | ICD-10-CM | POA: Diagnosis not present

## 2023-10-26 DIAGNOSIS — K8689 Other specified diseases of pancreas: Secondary | ICD-10-CM

## 2023-10-26 DIAGNOSIS — D709 Neutropenia, unspecified: Secondary | ICD-10-CM

## 2023-10-26 DIAGNOSIS — M79642 Pain in left hand: Secondary | ICD-10-CM | POA: Diagnosis not present

## 2023-10-26 DIAGNOSIS — D508 Other iron deficiency anemias: Secondary | ICD-10-CM

## 2023-10-26 DIAGNOSIS — K219 Gastro-esophageal reflux disease without esophagitis: Secondary | ICD-10-CM

## 2023-10-26 DIAGNOSIS — K5909 Other constipation: Secondary | ICD-10-CM

## 2023-10-26 LAB — COMPREHENSIVE METABOLIC PANEL WITH GFR
ALT: 21 U/L (ref 0–35)
AST: 23 U/L (ref 0–37)
Albumin: 4.1 g/dL (ref 3.5–5.2)
Alkaline Phosphatase: 131 U/L — ABNORMAL HIGH (ref 39–117)
BUN: 31 mg/dL — ABNORMAL HIGH (ref 6–23)
CO2: 28 meq/L (ref 19–32)
Calcium: 9.4 mg/dL (ref 8.4–10.5)
Chloride: 102 meq/L (ref 96–112)
Creatinine, Ser: 1.57 mg/dL — ABNORMAL HIGH (ref 0.40–1.20)
GFR: 33 mL/min — ABNORMAL LOW (ref >60.00)
Glucose, Bld: 129 mg/dL — ABNORMAL HIGH (ref 70–99)
Potassium: 4.2 meq/L (ref 3.5–5.1)
Sodium: 139 meq/L (ref 135–145)
Total Bilirubin: 0.5 mg/dL (ref 0.2–1.2)
Total Protein: 7.5 g/dL (ref 6.0–8.3)

## 2023-10-26 LAB — PROTIME-INR
INR: 1.2 ratio — ABNORMAL HIGH (ref 0.8–1.0)
Prothrombin Time: 12.9 s (ref 9.6–13.1)

## 2023-10-26 NOTE — Progress Notes (Signed)
 Agree with the assessment and plan as outlined by [facility], FNP-C.   Last colonoscopy in 08/2019 with 5 subcentimeter adenomas and 112 mm transverse colon adenoma.  Due for repeat colonoscopy for continued surveillance, but agree that addressing her cardiovascular concerns takes precedence.  With her LE edema and SOB, I recommend Cardiology referral for evaluation and formal clearance prior to setting up for colonoscopy due to elevated risk from sedation along with risks of volume shifting with bowel preparation.   Hemoglobin otherwise stable at 10.1 with normal iron  indices, so no urgency for endoscopic evaluation currently.  Plt low but stable at 107.  INR stable at 1.2.  Agree that if/when she is stable for surveillance colonoscopy, this would be best done in the hospital endoscopy unit.  Prior to scheduling any endoscopic procedures, I would plan to meet with her myself in the office to discuss since she has baseline elevated risks from underlying comorbidities.  Ireoluwa Gorsline, DO, [facility]

## 2023-10-26 NOTE — Patient Instructions (Signed)
 Pancreatic cyst We are ordering MRI to follow-up Continue Creon   Liver disease We have ordered lab work and follow-up imaging We will contact you with results and recommendations Will discuss procedures with Dr. San  History of colon poylps Due for colonoscopy- will discuss with Dr. San, may need to be done in hospital.  You have been scheduled for an abdominal ultrasound at [facility] (1st floor of hospital) on 11/03/23 at 7:00am. Please arrive 30 minutes prior to your appointment for registration. Make certain not to have anything to eat or drink after midnight prior to your appointment. Should you need to reschedule your appointment, please contact radiology at [phone]. This test typically takes about 30 minutes to perform.  You have been scheduled for an MRI at [facility] on 11/03/23. Your appointment time is 7:00am. Please arrive to admitting (at main entrance of the hospital) 30 minutes prior to your appointment time for registration purposes. Please make certain not to have anything to eat or drink after midnight prior to your test. In addition, if you have any metal in your body, have a pacemaker or defibrillator, please be sure to let your ordering physician know. This test typically takes 45 minutes to 1 hour to complete. Should you need to reschedule, please call [phone] to do so.  _______________________________________________________  If your blood pressure at your visit was 140/90 or greater, please contact your primary care physician to follow up on this.  _______________________________________________________  If you are age 66 or older, your body mass index should be between 23-30. Your Body mass index is 48.51 kg/m. If this is out of the aforementioned range listed, please consider follow up with your Primary Care Provider.  If you are age 75 or younger, your body mass index should be between 19-25. Your Body mass index is 48.51  kg/m. If this is out of the aformentioned range listed, please consider follow up with your Primary Care Provider.   ________________________________________________________  The [facility] providers would like to encourage you to use MYCHART to communicate with providers for non-urgent requests or questions.  Due to long hold times on the telephone, sending your provider a message by [facility] may be a faster and more efficient way to get a response.  Please allow 48 business hours for a response.  Please remember that this is for non-urgent requests.  _______________________________________________________  [facility] is using a team-based approach to care.  Your team is made up of your doctor and two to three APPS. Our APPS (Nurse Practitioners and Physician Assistants) work with your physician to ensure care continuity for you. They are fully qualified to address your health concerns and develop a treatment plan. They communicate directly with your gastroenterologist to care for you. Seeing the Advanced Practice Practitioners on your physician's team can help you by facilitating care more promptly, often allowing for earlier appointments, access to diagnostic testing, procedures, and other specialty referrals.   Thank you for trusting me with your gastrointestinal care. Deanna May, FNP-C

## 2023-10-26 NOTE — Progress Notes (Signed)
 Chief Complaint: liver disease Primary GI Doctor:(previously Dr. Teressa) Dr. San  HPI:  Patient is a 73 year old female patient with past medical history of diabetes, fibromyalgia, iron  deficiency anemia, CKD,NASH, CHF, obesity and OSA, who was referred to me by Dr. Norleen Kidney on 10/24/23 for a evaluation of her liver disease.   Last seen by Delon, PA on 09/17/21 for rectal bleeding, abd pain,and change in bowel habits.  Interval History     Patient presents for follow-up on liver disease.  Patient had liver biopsy back in September 2023 indicative of NASH with recommendations of lifestyle changes.  Patient recently seen by hematology for iron  deficiency anemia and neutropenia/thrombocytopenia.  Patient had complaints of swelling in her hands and her lower extremities.  Patient also states she has shortness of breath with exertion.She has been taking daily Lasix  100 mg in the morning and 60 mg in the evening. She has history of CHF? has not been seen by cardiology, managed by PCP.  Patient does not consume any alcohol.  Non-smoker.  She does tell me she recently was started on Ozempic about a week ago for weight loss.  Patient has one bowel movement once a week she states has been her norm her whole life. She reports she is not currently taking anything for this. She will have one large bowel movement followed by diarrhea once a week. No blood in stool.   Patient also has history of pancreatic cyst that has been followed previously by Dr. Teressa with MRI, last MRI in [name] 2023 which showed the incidental cystic lesion in the head of her pancreas was slightly bigger on exam.  Repeat MRI of the pancreas was due this past Demico Ploch. Patient reports taking Creon  36,000 1 tablet po daily with meals. She was started on it last year for pancreatic insufficiency?  Patient also taking Omeprazole 20 mg po daily for GERD. She reports this controls her symptoms. No dysphagia. No hematemesis.   Nonsmoker.  No alcohol use.   Patient has not been taking baby ASA due to thrombocytopenia.   Patient taking daily Meloxicam.  Surgical history: gastric sleeve (5-10 years ago) gallbladder, appendectomy   No family history of GI issues or CA.  Wt Readings from Last 3 Encounters:  10/26/23 248 lb 6 oz (112.7 kg)  10/21/23 239 lb 4.8 oz (108.5 kg)  07/29/23 202 lb (91.6 kg)    Past Medical History:  Diagnosis Date   Anemia    Arthritis    Asthma    OCCAS   Bell's palsy    Broken neck (HCC)    C-1   Cancer (HCC)    left ear squamus cell   Carbuncle and furuncle of trunk    Chronic dislocation of right shoulder    Depression    Disorder of fascia    HX OF NECROTIC FASCITIS AFTER ABDOMINAL SURGERY FOR HERNIA- REQUIRED 19 SURGERIES AND 2.5 MONTH HOSPITALIZATION AT [facility]   DM (diabetes mellitus) (HCC)    Dysrhythmia    HX OF TACHYCARDIA AND BRADYCARDIA - ON GOING FOR YEARS - DOES NOT HAVE TO SEE CARDIOLOGIST   Elevated cholesterol    Fibromyalgia    Fracture 07/2012   HX OF FRACTURED NECK C1- CAUSES SEVERE HEADACHES--LIMITED ROM NECK   Frequent infections    ESPECIALLY PRONE TO INFECTIONS AFTER SURGERIES   Graves disease    Heart murmur    DOES NOT CAUSE ANY PROBLEMS   History of kidney stones  Hyperlipidemia    Hypertension    Hypothyroidism    GRAVES DISEASE   Migraine    Nephrolithiasis    STAGE 3    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   Neuropathy    Obesity    OSA (obstructive sleep apnea)    USES CPAP - DOES NOT KNOW SETTING   Pain    LEFT SHOULDER  PAIN -HARD TO LIE ON LEFT SIDE FOR LONG PERIOD;  PAIN IN LOWER BADK - 3 HERNIATED DISCS AND STENOISIS -   Pneumonia    PONV (postoperative nausea and vomiting)    THE GAS MAKES ME NAUSEATED   Restless leg syndrome    Sciatica    Tachycardia    Urinary frequency    Urticaria    UTI (urinary tract infection)     Past Surgical History:  Procedure Laterality Date   ABDOMINAL HYSTERECTOMY     LARGE TUMOR AT OVARY REMOVED    ANTERIOR LAT LUMBAR FUSION N/A 07/28/2018   Procedure: Anterolateral Decompression Lumbar One-Two for osteomyelitis reconstruction w/titanium strut allograft Fusion Lumbar One-Two;  Surgeon: Colon Shove, MD;  Location: [facility] OR;  Service: Neurosurgery;  Laterality: N/A;  Left anterolateral approach   APPENDECTOMY     APPLICATION OF ROBOTIC ASSISTANCE FOR SPINAL PROCEDURE N/A 07/28/2018   Procedure: APPLICATION OF ROBOTIC ASSISTANCE FOR SPINAL PROCEDURE;  Surgeon: Colon Shove, MD;  Location: [facility] OR;  Service: Neurosurgery;  Laterality: N/A;   CARPAL TUNNEL RELEASE     BILATERAL   CATARACT EXTRACTION W/ INTRAOCULAR LENS  IMPLANT, BILATERAL     CESAREAN SECTION     X 3   CHOLECYSTECTOMY     CYSTOSCOPY WITH URETEROSCOPY Right 12/03/2013   Procedure: CYSTOSCOPY WITH RIGHT RETROGRADE URETEROSCOPY LASER LITHOTRIPSY RIGHT STONE RIGHT URETERAL STENT, ;  Surgeon: Gretel Ferrara, MD;  Location: [facility];  Service: Urology;  Laterality: Right;  PROCEDURE WAS ORIGINALLY SCHEDULED AS RIGHT PERCUTANEOUS NEPHROLITHOTOMY   EYELID LACERATION REPAIR     RIGHT EYE   HERNIA REPAIR     ABDOMINAL HERNIA REPAIR WITH MESH - 3 SURGERIES    HX OF 19 SURGERIES FOR NECROTIC FASCITIS     SKIN GRAFTS+ WOUND  VAC   I&D OF INFECTED SITE IN [place] - FROM AN INJECTION     INCISION AND DRAINAGE ABSCESS Left 07/23/2022   Procedure: INCISION AND DRAINAGE ABSCESS;  Surgeon: Eletha Boas, MD;  Location: [facility];  Service: General;  Laterality: Left;   IR FLUORO GUIDED NEEDLE PLC ASPIRATION/INJECTION LOC  06/03/2018   JOINT REPLACEMENT     TOTAL RIGHT KNEE REPLACEMENT   KNEE ARTHROSCOPY  RIGHT AND LEFT   X 2   LAPAROSCOPIC GASTRIC SLEEVE RESECTION  2014   NEPHROLITHOTOMY Right 12/10/2013   Procedure: NEPHROLITHOTOMY PERCUTANEOUS;  Surgeon: Gretel Ferrara, MD;  Location: [facility];  Service: Urology;  Laterality: Right;   POSTERIOR LUMBAR FUSION 4 LEVEL N/A 07/28/2018   Procedure: Posterior Fixation Thoracic Ten-Lumbar Four with pedicle  augmentaion with robotic assistance;  Surgeon: Colon Shove, MD;  Location: [facility] OR;  Service: Neurosurgery;  Laterality: N/A;  Posterior Fixation Thoracic Ten-Lumbar Four with pedicle augmentaion with robotic assistance   REVERSE SHOULDER ARTHROPLASTY Right 07/21/2017   REVERSE SHOULDER ARTHROPLASTY Right 07/21/2017   Procedure: RIGHT REVERSE SHOULDER ARTHROPLASTY;  Surgeon: Dozier Soulier, MD;  Location: [facility] OR;  Service: Orthopedics;  Laterality: Right;   RIGHT FOOT DRAINAGE OF INFECTION     shoulder arthroscopy Right    X 2   TONSILLECTOMY  AND ADENOIDECTOMY   TOTAL KNEE ARTHROPLASTY Left 03/01/2023   Procedure: LEFT KNEE TOTAL KNEE ARTHROPLASTY;  Surgeon: Sheril Coy, MD;  Location: [facility];  Service: Orthopedics;  Laterality: Left;    Current Outpatient Medications  Medication Sig Dispense Refill   acetaminophen  (TYLENOL ) 500 MG tablet Take 2 tablets (1,000 mg total) by mouth every 6 (six) hours as needed for mild pain (or Fever >/= 101).     amLODipine  (NORVASC ) 5 MG tablet Take 5 mg by mouth at bedtime.     BD INSULIN  SYRINGE U/F 31G X 5/16 0.5 ML MISC USE 5 TIMES A DAY LEVEMIR  AND NOVOLOG      BD PEN NEEDLE MICRO U/F 32G X 6 MM MISC Inject 32 pens into the skin daily.     Cholecalciferol  (DIALYVITE VITAMIN D 5000) 125 MCG (5000 UT) capsule Take 5,000 Units by mouth daily.     clonazePAM  (KLONOPIN ) 0.5 MG tablet Take 0.5 mg by mouth See admin instructions. Take 0.5 mg at bedtime, Zian Mohamed take an additional 0.5 mg tablet twice daily as needed for anxiety  0   CREON  36000-114000 units CPEP capsule Take 36,000 Units by mouth 3 (three) times daily with meals.     doxazosin  (CARDURA ) 2 MG tablet Take 2 mg by mouth daily.     fenofibrate  54 MG tablet Take 54 mg by mouth daily with supper.  2   FLUoxetine  (PROZAC ) 20 MG capsule Take 40 mg by mouth at bedtime.  3   fluticasone  (FLONASE ) 50 MCG/ACT nasal spray Place 2 sprays into both nostrils daily as needed for allergies or rhinitis.      folic acid  (FOLVITE ) 1 MG tablet Take 1 mg by mouth daily.     FREESTYLE TEST STRIPS test strip USE STRIPS TO TEST BLOOD GLUCOSE BID     furosemide  (LASIX ) 40 MG tablet Take 40 mg by mouth daily.     gabapentin  (NEURONTIN ) 300 MG capsule Take 1 capsule (300 mg total) by mouth 3 (three) times daily for 30 days. 90 capsule 0   insulin  aspart (NOVOLOG ) 100 UNIT/ML FlexPen Sliding scale dose: CBG < 70: implement hypoglycemia protocol  CBG 70 - 120: 0 units  CBG 121 - 150: 0 units  CBG 151 - 200: 0 units  CBG 201 - 250: 2 units  CBG 251 - 300: 3 units  CBG 301 - 350: 4 units  CBG 351 - 400: 5 units (Patient taking differently: Inject 20-25 Units into the skin 2 (two) times daily as needed for high blood sugar.) 15 mL 0   Insulin  Glargine (BASAGLAR  KWIKPEN) 100 UNIT/ML Inject 22 Units into the skin 2 (two) times daily.     Lancets Misc. (UNISTIK 3 COMFORT) MISC USE LANCETS TO CHECK BLOOD GLUCOSE BID     losartan  (COZAAR ) 25 MG tablet Take 25 mg by mouth.     metoprolol  tartrate (LOPRESSOR ) 50 MG tablet Take 50 mg by mouth 2 (two) times daily.     mupirocin  ointment (BACTROBAN ) 2 % Apply 1 Application topically 2 (two) times daily. 22 g 2   NON FORMULARY Pt uses a c-pap nightly     nutrition supplement, JUVEN, (JUVEN) PACK Take 1 packet by mouth 2 (two) times daily between meals.     omeprazole (PRILOSEC) 20 MG capsule Take 20 mg by mouth daily with supper.      ondansetron  (ZOFRAN -ODT) 4 MG disintegrating tablet Take 1 tablet (4 mg total) by mouth every 6 (six) hours as needed for nausea or  vomiting. 30 tablet 0   oxybutynin  (DITROPAN -XL) 10 MG 24 hr tablet Take 10 mg by mouth daily.     propylthiouracil  (PTU) 50 MG tablet Take 50 mg by mouth daily.  0   rOPINIRole  (REQUIP ) 2 MG tablet Take 2-3 mg by mouth See admin instructions. Take 2 mg by mouth in the morning and take 3 mg in the evening     tiZANidine  (ZANAFLEX ) 4 MG tablet Take 1 tablet (4 mg total) by mouth at bedtime. 30 tablet 0    VENTOLIN  HFA 108 (90 Base) MCG/ACT inhaler Inhale 2 puffs into the lungs every 4 (four) hours as needed for shortness of breath.     aspirin  EC 81 MG tablet Take 1 tablet (81 mg total) by mouth 2 (two) times daily after a meal. For 2 weeks then once a day for 2 weeks for DVT prevention. (Patient not taking: Reported on 10/26/2023) 45 tablet 0   No current facility-administered medications for this visit.    Allergies as of 10/26/2023 - Review Complete 10/26/2023  Allergen Reaction Noted   Morphine  Other (See Comments) 01/25/2008   Promethazine hcl Other (See Comments) 01/25/2008   Sumatriptan Anaphylaxis and Other (See Comments) 11/22/2013   Codeine Nausea And Vomiting, Nausea Only, and Other (See Comments) 01/25/2008   Hydrocodone -acetaminophen  Nausea And Vomiting 12/10/2013   Misc. sulfonamide containing compounds Hives and Rash 01/25/2008   Nsaids Other (See Comments) 11/27/2013   Statins Other (See Comments) 07/20/2014   Sulfonamide derivatives Hives 01/25/2008   Methimazole Hives 12/21/2016   Niacin  06/18/2009    Family History  Problem Relation Age of Onset   Stroke Mother    Bladder Cancer Mother    Diabetes Father    Osteoarthritis Father    Heart disease Father    Ulcers Father    Suicidality Sister    Colon cancer Neg Hx    Stomach cancer Neg Hx    Rectal cancer Neg Hx    Esophageal cancer Neg Hx    Pancreatic cancer Neg Hx     Review of Systems:    Constitutional: No weight loss, fever, chills, weakness or fatigue HEENT: Eyes: No change in vision               Ears, Nose, Throat:  No change in hearing or congestion Skin: No rash or itching Cardiovascular: No chest pain, chest pressure or palpitations   Respiratory: No SOB or cough Gastrointestinal: See HPI and otherwise negative Genitourinary: No dysuria or change in urinary frequency Neurological: No headache, dizziness or syncope Musculoskeletal: No new muscle or joint pain Hematologic: No bleeding or  bruising Psychiatric: No history of depression or anxiety    Physical Exam:  Vital signs: BP 130/75   Pulse 68   Ht 5' (1.524 m) Comment: height without shoes  Wt 248 lb 6 oz (112.7 kg)   BMI 48.51 kg/m   Constitutional: Pleasant  female appears to be in NAD, Well developed, Well nourished, alert and cooperative Throat: Oral cavity and pharynx without inflammation, swelling or lesion.  Respiratory: Respirations even and unlabored. Lungs clear to auscultation bilaterally.   No wheezes, crackles, or rhonchi.  Cardiovascular: Normal S1, S2. Regular rate and rhythm. No peripheral edema, cyanosis or pallor.  Gastrointestinal:  Soft, nondistended, nontender. Obese, scabbed areas noted in folds. Large scar across abdomen. No rebound or guarding. Normal bowel sounds.  Rectal:  Not performed.  Msk:  Uses walker. Trace edema, no deformity or joint abnormality.  Neurologic:  Alert and  oriented x4;  grossly normal neurologically.  Skin:   Dry and intact without significant lesions or rashes.  RELEVANT LABS AND IMAGING: CBC    Latest Ref Rng & Units 10/21/2023   10:23 AM 05/10/2023    3:57 PM 03/03/2023    3:31 AM  CBC  WBC 4.0 - 10.5 K/uL 1.7  2.1  3.4   Hemoglobin 12.0 - 15.0 g/dL 89.8  89.2  9.6   Hematocrit 36.0 - 46.0 % 30.9  31.8  28.7   Platelets 150 - 400 K/uL 107  112  109      CMP     Latest Ref Rng & Units 05/10/2023    3:57 PM 03/02/2023    3:26 AM 02/02/2023   10:48 AM  CMP  Glucose 70 - 99 mg/dL 849  834  897   BUN 8 - 23 mg/dL 36  45  56   Creatinine 0.44 - 1.00 mg/dL 8.74  8.45  8.55   Sodium 135 - 145 mmol/L 139  135  140   Potassium 3.5 - 5.1 mmol/L 4.0  4.7  4.3   Chloride 98 - 111 mmol/L 106  102  103   CO2 22 - 32 mmol/L 27  23  30    Calcium  8.9 - 10.3 mg/dL 9.4  8.8  9.7   Total Protein 6.5 - 8.1 g/dL 7.0   7.6   Total Bilirubin 0.0 - 1.2 mg/dL 0.4   0.4   Alkaline Phos 38 - 126 U/L 105   68   AST 15 - 41 U/L 14   18   ALT 0 - 44 U/L 12   16      Lab  Results  Component Value Date   TSH 0.821 07/05/2015     02/2022 echo- Left ventricular ejection fraction, by estimation, is 55 to 60%.   Imaging:  06/2019 MRI/MRCP IMPRESSION: 1. Stable appearance of multilocular cystic lesion within head of pancreas. As mentioned previously this is consistent with cystic neoplasm. According to consensus criteria further evaluation with endoscopic ultrasound/FNA is recommended. This recommendation follows ACR consensus guidelines: Management of Incidental Pancreatic Cysts: A White Paper of the ACR Incidental Findings Committee. J Am Coll Radiol 2017;14:911-923. 2. Splenomegaly.   5/23 MRI abd IMPRESSION: 1. Increased size of the 3.6 cm multilocular cystic lesion in the head of the pancreas, which as before is consistent with a cystic pancreatic neoplasm. Given the increase in size from prior would suggest further evaluation with EUS/FNA and surgical consult 2. Similar splenomegaly. 3. Cardiac enlargement with heterogeneous signal in the lung bases possibly reflecting pulmonary edema.   09/2021 CTAP IMPRESSION: 1. Questionable diffuse mild colonic wall thickening from the mid transverse colon to the rectum, versus nondistention. Oral contrast reaches the mid transverse colon. Correlate clinically for possible colitis. 2. Marked hepatomegaly with subtle nodular contour which Karolina Zamor represent early cirrhosis. 3. Splenomegaly. 4. Grossly stable size of the lobulated multiloculated cystic mass in the head of the pancreas. 5. Bilateral nephrolithiasis. 6. Tiny hiatal hernia.  11/03/21 US  elastography liver ULTRASOUND LIVER:   Coarsened echogenic hepatic parenchyma, can be seen with fatty infiltration and cirrhosis.   Due to limitations of exam, unable to establish patency and direction of blood flow of the portal vein Parent   ULTRASOUND HEPATIC ELASTOGRAPHY: Median kPa:  5.7   Diagnostic category: < or = 9 kPa: in the absence of other  known clinical signs, rules out cACLD  11/25/2021 US  biopsy liver A.  LIVER, LEFT, NEEDLE CORE BIOPSY:  - Mildly active steatohepatitis (grade 1 of 3). See comment  - Portal fibrosis with focally bridging fibrous septa (stage 2-3 of 4   07/2022 CTAP 1. Findings compatible with cellulitis of the anterior inferior abdominal wall. No evidence for soft tissue gas or abscess. 2. Stable hepatosplenomegaly. 3. Nonobstructing bilateral renal calculi. 4. Stable small left ventral hernia containing nondilated bowel. 5. 14 mm cystic lesion in the head of the pancreas has slightly decreased in size. Recommend follow-up MRI in 1 year.   Procedures: 09/10/19 EGD - Asymptomatic thin Schatzki' s ring. - Typical anatomy s/ p remote gastric sleeve operation. - The examined duodenum was normal. 09/10/19 colonoscopy, recall 3 years - Five 2 to 7 mm polyps in the descending colon, in the transverse colon and in the cecum, removed with a cold snare. Resected and retrieved. - One 12 mm polyp in the transverse colon, removed with a hot snare. Resected and retrieved. - Diverticulosis in the left colon. - The examination was otherwise normal on direct and retroflexion views. Path: Diagnosis Surgical [P], colon, descending, transverse-3, and cecum, polyp (5) - TUBULAR ADENOMA (X5). - NEGATIVE FOR HIGH GRADE DYSPLASIA.  10/17/2008 colonoscopy 1) Normal colon 2) No polyps or cancers  Assessment: 73 year old female patient with extensive history who presents for follow-up for following issues:  History of Pancreatic cyst, incidental finding March 2020, follow-up MRI/MRCP in April 2021 that showed stable appearance.  Another MRI in [place] 2023 showed increased size of 3.6 cm cystic lesion in the head of the pancreas.  Recommendations by Dr. Teressa for follow-up MRI in 2 years.  Will go ahead and schedule MRI/MRCP.  Patient will continue Creon  p.o. daily as prescribed.  No complaints of weight loss or diarrhea.  History  of Hollie, incidental finding on CT scan 09/2021 which showed marked hepatomegaly with subtle nodular contour which Finis Hendricksen represent early cirrhosis and splenomegaly.  Ultrasound elastography showed hepatomegaly with coarsened echotexture, splenomegaly, thrombocytopenia and AST to ALT ratio all suggestive of cirrhosis, Liver biopsy ordered 11/2021 which showed Steatohepatitis (grade 1 of 3) refers to a mild form of non-alcoholic steatohepatitis (NASH).Portal fibrosis with focally bridging fibrous septa (stage 2-3 of 4).  Most recent CMP with normal liver function test.  Platelets 112. Hgb 10.7.  Patient seen by hematology.  Thrombocytopenia/neutropenia in setting of splenomegaly  Seen by hematology Dr. Federico SHAW, EGD/Colonoscopy from 09/10/2019 show no evidence of active bleeding  Receives IV iron  prn, unable to tolerate oral iron   History of CHF, referred to cardiology in [place]. Patient tells me she does not have appt. Last echo 02/2022 EF 55-60% On Lasix   Plan: - Check CMP, PT/INR today -Continue Creon  36,000units po daily -Repeat MRI/MRCP pancreatic cyst (07/2023) - Order US  elastography liver to evaluate staging/progression -Recommend Miralax  po daily for constipation -defer procedures to  Dr. San. Due for colonoscopy for hx of TA's, would need 2 day prep. Platelets low. Pending cardiac eval? Patient does not have appt. Currently with swelling and SOB with exertion. She would probably be more suitable for hospital setting.   Thank you for the courtesy of this consult. Please call me with any questions or concerns.   Reagyn Facemire, FNP-C [facility] 10/26/2023, 10:06 AM  Cc: Shayne Anes, MD

## 2023-10-31 ENCOUNTER — Telehealth: Payer: Self-pay

## 2023-10-31 ENCOUNTER — Ambulatory Visit: Payer: Self-pay | Admitting: Gastroenterology

## 2023-10-31 ENCOUNTER — Other Ambulatory Visit: Payer: Self-pay

## 2023-10-31 DIAGNOSIS — R600 Localized edema: Secondary | ICD-10-CM

## 2023-10-31 DIAGNOSIS — R0602 Shortness of breath: Secondary | ICD-10-CM

## 2023-10-31 NOTE — Progress Notes (Signed)
Amb ref

## 2023-10-31 NOTE — Telephone Encounter (Signed)
-----   Message from Cathryne PARAS May sent at 10/27/2023  5:22 PM EDT ----- Karna- Let patient know following from Dr. San    He recommends Cardiology referral for evaluation and formal clearance prior to setting up for colonoscopy due to elevated risk from sedation along with risks of volume shifting with bowel preparation.   Then after patient has referral and appointment he would like to see her himself in the office before proceeding with any endoscopic procedures.   Cathryne, NP ----- Message ----- From: San Sandor GAILS, DO Sent: 10/26/2023   5:27 PM EDT To: Cathryne PARAS May, NP

## 2023-11-03 ENCOUNTER — Ambulatory Visit (HOSPITAL_COMMUNITY)

## 2023-11-14 ENCOUNTER — Encounter: Payer: Self-pay | Admitting: *Deleted

## 2023-11-15 ENCOUNTER — Ambulatory Visit (HOSPITAL_COMMUNITY)
Admission: RE | Admit: 2023-11-15 | Discharge: 2023-11-15 | Disposition: A | Discharge status: Home / Self Care | Source: Ambulatory Visit | Attending: Gastroenterology | Admitting: Gastroenterology

## 2023-11-15 ENCOUNTER — Other Ambulatory Visit: Payer: Self-pay | Admitting: Gastroenterology

## 2023-11-15 ENCOUNTER — Ambulatory Visit (HOSPITAL_COMMUNITY)
Admission: RE | Admit: 2023-11-15 | Discharge: 2023-11-15 | Disposition: A | Discharge status: Home / Self Care | Source: Ambulatory Visit | Attending: Gastroenterology

## 2023-11-15 ENCOUNTER — Telehealth (INDEPENDENT_AMBULATORY_CARE_PROVIDER_SITE_OTHER): Payer: BLUE CROSS/BLUE SHIELD | Admitting: Adult Health

## 2023-11-15 DIAGNOSIS — K838 Other specified diseases of biliary tract: Secondary | ICD-10-CM | POA: Diagnosis not present

## 2023-11-15 DIAGNOSIS — K759 Inflammatory liver disease, unspecified: Secondary | ICD-10-CM | POA: Diagnosis not present

## 2023-11-15 DIAGNOSIS — K760 Fatty (change of) liver, not elsewhere classified: Secondary | ICD-10-CM | POA: Insufficient documentation

## 2023-11-15 DIAGNOSIS — G4733 Obstructive sleep apnea (adult) (pediatric): Secondary | ICD-10-CM

## 2023-11-15 DIAGNOSIS — R160 Hepatomegaly, not elsewhere classified: Secondary | ICD-10-CM | POA: Diagnosis not present

## 2023-11-15 DIAGNOSIS — K862 Cyst of pancreas: Secondary | ICD-10-CM | POA: Diagnosis not present

## 2023-11-15 DIAGNOSIS — K8689 Other specified diseases of pancreas: Secondary | ICD-10-CM | POA: Diagnosis not present

## 2023-11-15 DIAGNOSIS — R932 Abnormal findings on diagnostic imaging of liver and biliary tract: Secondary | ICD-10-CM | POA: Diagnosis not present

## 2023-11-15 DIAGNOSIS — K709 Alcoholic liver disease, unspecified: Secondary | ICD-10-CM | POA: Diagnosis not present

## 2023-11-15 MED ORDER — GADOBUTROL 1 MMOL/ML IV SOLN
10.0000 mL | Freq: Once | INTRAVENOUS | Status: AC | PRN
  Administered 2023-11-15: 10 mL via INTRAVENOUS

## 2023-11-15 NOTE — Patient Instructions (Signed)
 Continue using CPAP nightly and greater than 4 hours each night I have sent an order increasing your pressure If your symptoms worsen or you develop new symptoms please let us  know.
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
## 2023-11-17 ENCOUNTER — Telehealth: Payer: Self-pay

## 2023-11-17 NOTE — Telephone Encounter (Signed)
 New, Adine Endow, Sherrod CROME, CMA; Ziegler, Melissa; Cain, Mitchell; Garcia, Patricia; Tucker, Dolanda; 1 other Received, thank you!     Previous Messages    ----- Message ----- From: Endow Sherrod CROME, CMA Sent: 11/16/2023   1:32 PM EDT To: Adine Leer; Avelina Sprung; Ephraim Dollar* Subject: Increase Pressure                              Hello, Megan NP sent an order yesterday to increase pressure: 9-20 cmh2 0. Please process request.

## 2023-11-18 NOTE — Telephone Encounter (Signed)
 See alternate pt message.

## 2023-11-22 DIAGNOSIS — L7682 Other postprocedural complications of skin and subcutaneous tissue: Secondary | ICD-10-CM | POA: Diagnosis not present

## 2023-11-23 ENCOUNTER — Telehealth: Payer: Self-pay | Admitting: *Deleted

## 2023-11-23 NOTE — Telephone Encounter (Signed)
 Received vm message from pt. She states she saw her surgeon, Dr. Drema. She states shhas an old wound that has opened and is draining. She is asking if she needs any granix due to low WB C as of 8//6/25.  Please advise.

## 2023-11-24 ENCOUNTER — Telehealth: Payer: Self-pay | Admitting: Gastroenterology

## 2023-11-24 NOTE — Telephone Encounter (Signed)
 Called patient to discuss results of MRI as well as US  elastography along with Dr. Rennis recommendations.  Would he will further discuss the imaging and further testing at follow-up.  Patient does have cardiac eval on October 17 and then she can follow-up with our office.

## 2023-11-25 ENCOUNTER — Telehealth: Payer: Self-pay | Admitting: *Deleted

## 2023-11-25 NOTE — Telephone Encounter (Signed)
 TCT patient regarding previous conversation about her question about GCSF Spoke with her. Advised that Dr. Federico does not recommend GCSF unless she were to develop signs/symptoms of infection. Pt voiced understanding.

## 2023-12-06 ENCOUNTER — Ambulatory Visit: Attending: Cardiology | Admitting: Cardiology

## 2023-12-06 ENCOUNTER — Encounter: Payer: Self-pay | Admitting: Cardiology

## 2023-12-06 VITALS — BP 132/72 | HR 88 | Ht 60.0 in | Wt 227.2 lb

## 2023-12-06 DIAGNOSIS — D61818 Other pancytopenia: Secondary | ICD-10-CM | POA: Insufficient documentation

## 2023-12-06 DIAGNOSIS — R0989 Other specified symptoms and signs involving the circulatory and respiratory systems: Secondary | ICD-10-CM | POA: Diagnosis not present

## 2023-12-06 DIAGNOSIS — R0609 Other forms of dyspnea: Secondary | ICD-10-CM | POA: Insufficient documentation

## 2023-12-06 DIAGNOSIS — E1169 Type 2 diabetes mellitus with other specified complication: Secondary | ICD-10-CM | POA: Insufficient documentation

## 2023-12-06 DIAGNOSIS — E669 Obesity, unspecified: Secondary | ICD-10-CM | POA: Diagnosis not present

## 2023-12-06 DIAGNOSIS — Z01810 Encounter for preprocedural cardiovascular examination: Secondary | ICD-10-CM | POA: Insufficient documentation

## 2023-12-06 DIAGNOSIS — G4733 Obstructive sleep apnea (adult) (pediatric): Secondary | ICD-10-CM | POA: Insufficient documentation

## 2023-12-06 DIAGNOSIS — I10 Essential (primary) hypertension: Secondary | ICD-10-CM | POA: Diagnosis not present

## 2023-12-06 DIAGNOSIS — I700 Atherosclerosis of aorta: Secondary | ICD-10-CM | POA: Insufficient documentation

## 2023-12-06 NOTE — Patient Instructions (Signed)
 Medication Instructions:  Your physician has recommended you make the following change in your medication:  Please contact your Primary Care Provider to wean you off of Doxazosin  (Cardura ) due to low Blood Pressure  *If you need a refill on your cardiac medications before your next appointment, please call your pharmacy*  Lab Work: BMP, BNP at [facility] on the 1st floor  If you have labs (blood work) drawn today and your tests are completely normal, you will receive your results only by: MyChart Message (if you have MyChart) OR A paper copy in the mail If you have any lab test that is abnormal or we need to change your treatment, we will call you to review the results.  Testing/Procedures: Your physician has requested that you have an echocardiogram. Echocardiography is a painless test that uses sound waves to create images of your heart. It provides your doctor with information about the size and shape of your heart and how well your heart's chambers and valves are working. This procedure takes approximately one hour. There are no restrictions for this procedure. Please do NOT wear cologne, perfume, aftershave, or lotions (deodorant is allowed). Please arrive 15 minutes prior to your appointment time.  Please note: We ask at that you not bring children with you during ultrasound (echo/ vascular) testing. Due to room size and safety concerns, children are not allowed in the ultrasound rooms during exams. Our front office staff cannot provide observation of children in our lobby area while testing is being conducted. An adult accompanying a patient to their appointment will only be allowed in the ultrasound room at the discretion of the ultrasound technician under special circumstances. We apologize for any inconvenience.  Your physician has requested that you have a carotid duplex. This test is an ultrasound of the carotid arteries in your neck. It looks at blood flow through these arteries that  supply the brain with blood. Allow one hour for this exam. There are no restrictions or special instructions.  Your physician has requested that you have a Cardiac PET Scan.   Follow-Up: At [facility], you and your health needs are our priority.  As part of our continuing mission to provide you with exceptional heart care, our providers are all part of one team.  This team includes your primary Cardiologist (physician) and Advanced Practice Providers or APPs (Physician Assistants and Nurse Practitioners) who all work together to provide you with the care you need, when you need it.  Your next appointment:   6-7 week(s) needs to be after all testing is complete   Provider:   Madonna Large, DO       Other Instructions    Please report to Radiology at the [facility] Main Entrance 30 minutes early for your test.  [address] [place], [place] [address]                         OR   Please report to Radiology at [facility] Main Entrance, medical mall, 30 mins prior to your test.  [address]  [place], [place]  How to Prepare for Your Cardiac PET/CT Stress Test:  Nothing to eat or drink, except water, 3 hours prior to arrival time.  NO caffeine/decaffeinated products, or chocolate 12 hours prior to arrival. (Please note decaffeinated beverages (teas/coffees) still contain caffeine).  If you have caffeine within 12 hours prior, the test will need to be rescheduled.  Medication instructions: Do not take erectile dysfunction medications for 72 hours prior to test (sildenafil, tadalafil) Do not take nitrates (isosorbide mononitrate, Ranexa) the day before or day of test Do not take tamsulosin the day before or morning of test Hold theophylline containing medications for 12 hours. Hold Dipyridamole 48 hours prior to the test.  Diabetic Preparation: If able to eat breakfast prior to 3 hour fasting, you may take all medications,  including your insulin . Do not worry if you miss your breakfast dose of insulin  - start at your next meal. If you do not eat prior to 3 hour fast-Hold all diabetes (oral and insulin ) medications. Patients who wear a continuous glucose monitor MUST remove the device prior to scanning.  You may take your remaining medications with water.  NO perfume, cologne or lotion on chest or abdomen area. FEMALES - Please avoid wearing dresses to this appointment.  Total time is 1 to 2 hours; you may want to bring reading material for the waiting time.   In preparation for your appointment, medication and supplies will be purchased.  Appointment availability is limited, so if you need to cancel or reschedule, please call the Radiology Department Scheduler at [phone] 24 hours in advance to avoid a cancellation fee of $100.00  What to Expect When you Arrive:  Once you arrive and check in for your appointment, you will be taken to a preparation room within the Radiology Department.  A technologist or Nurse will obtain your medical history, verify that you are correctly prepped for the exam, and explain the procedure.  Afterwards, an IV will be started in your arm and electrodes will be placed on your skin for EKG monitoring during the stress portion of the exam. Then you will be escorted to the PET/CT scanner.  There, staff will get you positioned on the scanner and obtain a blood pressure and EKG.  During the exam, you will continue to be connected to the EKG and blood pressure machines.  A small, safe amount of a radioactive tracer will be injected in your IV to obtain a series of pictures of your heart along with an injection of a stress agent.    After your Exam:  It is recommended that you eat a meal and drink a caffeinated beverage to counter act any effects of the stress agent.  Drink plenty of fluids for the remainder of the day and urinate frequently for the first couple of hours after the exam.   Your doctor will inform you of your test results within 7-10 business days.  For more information and frequently asked questions, please visit our website: [email or web address]  For questions about your test or how to prepare for your test, please call: Cardiac Imaging Nurse Navigators Office: [phone]

## 2023-12-06 NOTE — Progress Notes (Signed)
 Cardiology Office Note:    Date:  12/06/2023  NAME:  [name]    MRN: [id number] DOB:  11/22/1950   PCP:  Shayne Anes, MD  Former Cardiology Providers: None Primary Cardiologist:  Madonna Large, DO, [facility] (established care 12/06/2023) Electrophysiologist:  None   Referring MD: May, Deanna J, NP  Reason of Consult: Preprocedural risk stratification, evaluation for congestive heart failure  Chief Complaint  Patient presents with   Follow-up    Establish care,?  Heart failure, preprocedure risk stratification    History of Present Illness:    [name] is a 73 y.o. Caucasian female with a complex past medical history which includes: diabetes, fibromyalgia, iron  deficiency anemia and PRBC, gastric sleeve (5-10 years ago), CKD,NASH, CHF, obesity and OSA. She is being seen today for the evaluation of preprocedure risk stratification and evaluation for congestive heart failure at the request of May, Deanna J, NP.  In August 2025 patient was evaluated by Dr. San and they had discussions with regards to undergoing repeat colonoscopy for surveillance given her history of adenoma.  But given her lower extremity swelling, shortness of breath, pancytopenia they recommended cardiovascular evaluation prior to scheduling colonoscopy as she is at elevated risk from sedation and volume shifts with bowel preparation.  Patient denies anginal chest pain. She endorses shortness of breath with effort related activities More pronounced with walking over the last 3 to 4 months. No exertional chest pain Denies orthopnea, PND, lower extremity swelling. Patient is a non-smoker. Patient states that she has actually lost 20 pounds but despite that she is short of breath with exertion. Denies CAD/myocardial infarction/coronary interventions.  Her initial office blood pressures were low at 92/62 mmHg.  Upon recheck it is 132/72 mmHg.  Patient states that her medications are prepackaged.  She was  on high doses of Lasix  up to 100 mg p.o. daily and now she is taking 40 mg p.o. daily.  Patient is pancytopenic and follows with hematology as well.  Overall functional capacity is limited, she presents today in a wheelchair and ambulates with a cane.  No structured exercise program or daily routine.  Current Medications: Current Meds  Medication Sig   amLODipine  (NORVASC ) 5 MG tablet Take 5 mg by mouth at bedtime.   BD INSULIN  SYRINGE U/F 31G X 5/16 0.5 ML MISC USE 5 TIMES A DAY LEVEMIR  AND NOVOLOG    BD PEN NEEDLE MICRO U/F 32G X 6 MM MISC Inject 32 pens into the skin daily.   Cholecalciferol  (DIALYVITE VITAMIN D 5000) 125 MCG (5000 UT) capsule Take 5,000 Units by mouth daily.   clonazePAM  (KLONOPIN ) 0.5 MG tablet Take 0.5 mg by mouth See admin instructions. Take 0.5 mg at bedtime, may take an additional 0.5 mg tablet twice daily as needed for anxiety   CREON  36000-114000 units CPEP capsule Take 36,000 Units by mouth 3 (three) times daily with meals.   doxazosin  (CARDURA ) 2 MG tablet Take 2 mg by mouth daily.   fenofibrate  54 MG tablet Take 54 mg by mouth daily with supper.   FLUoxetine  (PROZAC ) 20 MG capsule Take 40 mg by mouth at bedtime.   folic acid  (FOLVITE ) 1 MG tablet Take 1 mg by mouth daily.   furosemide  (LASIX ) 40 MG tablet Take 40 mg by mouth daily.   gabapentin  (NEURONTIN ) 300 MG capsule Take 1 capsule (300 mg total) by mouth 3 (three) times daily for 30 days.   insulin  aspart (NOVOLOG ) 100 UNIT/ML FlexPen Sliding scale dose: CBG <  70: implement hypoglycemia protocol  CBG 70 - 120: 0 units  CBG 121 - 150: 0 units  CBG 151 - 200: 0 units  CBG 201 - 250: 2 units  CBG 251 - 300: 3 units  CBG 301 - 350: 4 units  CBG 351 - 400: 5 units (Patient taking differently: Inject 20-25 Units into the skin 2 (two) times daily as needed for high blood sugar.)   Insulin  Glargine (BASAGLAR  KWIKPEN) 100 UNIT/ML Inject 22 Units into the skin 2 (two) times daily.   losartan  (COZAAR ) 25 MG  tablet Take 25 mg by mouth.   metoprolol  tartrate (LOPRESSOR ) 50 MG tablet Take 50 mg by mouth 2 (two) times daily.   NON FORMULARY Pt uses a c-pap nightly   nutrition supplement, JUVEN, (JUVEN) PACK Take 1 packet by mouth 2 (two) times daily between meals.   omeprazole (PRILOSEC) 20 MG capsule Take 20 mg by mouth daily with supper.    ondansetron  (ZOFRAN -ODT) 4 MG disintegrating tablet Take 1 tablet (4 mg total) by mouth every 6 (six) hours as needed for nausea or vomiting.   oxybutynin  (DITROPAN -XL) 10 MG 24 hr tablet Take 10 mg by mouth daily.   propylthiouracil  (PTU) 50 MG tablet Take 50 mg by mouth daily.   rOPINIRole  (REQUIP ) 2 MG tablet Take 2-3 mg by mouth See admin instructions. Take 2 mg by mouth in the morning and take 3 mg in the evening   tiZANidine  (ZANAFLEX ) 4 MG tablet Take 1 tablet (4 mg total) by mouth at bedtime.   VENTOLIN  HFA 108 (90 Base) MCG/ACT inhaler Inhale 2 puffs into the lungs every 4 (four) hours as needed for shortness of breath.   [DISCONTINUED] acetaminophen  (TYLENOL ) 500 MG tablet Take 2 tablets (1,000 mg total) by mouth every 6 (six) hours as needed for mild pain (or Fever >/= 101).     Allergies:    Morphine , Promethazine hcl, Sumatriptan, Codeine, Hydrocodone -acetaminophen , Misc. sulfonamide containing compounds, Nsaids, Statins, Sulfonamide derivatives, Methimazole, and Niacin   Past Medical History: Past Medical History:  Diagnosis Date   Anemia    Arthritis    Asthma    OCCAS   Bell's palsy    Broken neck (HCC)    C-1   Cancer (HCC)    left ear squamus cell   Carbuncle and furuncle of trunk    Chronic dislocation of right shoulder    Depression    Disorder of fascia    HX OF NECROTIC FASCITIS AFTER ABDOMINAL SURGERY FOR HERNIA- REQUIRED 19 SURGERIES AND 2.5 MONTH HOSPITALIZATION AT [facility]   DM (diabetes mellitus) (HCC)    Dysrhythmia    HX OF TACHYCARDIA AND BRADYCARDIA - ON GOING FOR YEARS - DOES NOT HAVE TO SEE CARDIOLOGIST   Elevated  cholesterol    Fibromyalgia    Fracture 07/2012   HX OF FRACTURED NECK C1- CAUSES SEVERE HEADACHES--LIMITED ROM NECK   Frequent infections    ESPECIALLY PRONE TO INFECTIONS AFTER SURGERIES   Graves disease    Heart murmur    DOES NOT CAUSE ANY PROBLEMS   History of kidney stones    Hyperlipidemia    Hypertension    Hypothyroidism    GRAVES DISEASE   Migraine    Nephrolithiasis    STAGE 3    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   Neuropathy    Obesity    OSA (obstructive sleep apnea)    USES CPAP - DOES NOT KNOW SETTING   Pain    LEFT  SHOULDER  PAIN -HARD TO LIE ON LEFT SIDE FOR LONG PERIOD;  PAIN IN LOWER BADK - 3 HERNIATED DISCS AND STENOISIS -   Pneumonia    PONV (postoperative nausea and vomiting)    THE GAS MAKES ME NAUSEATED   Restless leg syndrome    Sciatica    Tachycardia    Urinary frequency    Urticaria    UTI (urinary tract infection)     Past Surgical History: Past Surgical History:  Procedure Laterality Date   ABDOMINAL HYSTERECTOMY     LARGE TUMOR AT OVARY REMOVED   ANTERIOR LAT LUMBAR FUSION N/A 07/28/2018   Procedure: Anterolateral Decompression Lumbar One-Two for osteomyelitis reconstruction w/titanium strut allograft Fusion Lumbar One-Two;  Surgeon: Colon Shove, MD;  Location: [facility] OR;  Service: Neurosurgery;  Laterality: N/A;  Left anterolateral approach   APPENDECTOMY     APPLICATION OF ROBOTIC ASSISTANCE FOR SPINAL PROCEDURE N/A 07/28/2018   Procedure: APPLICATION OF ROBOTIC ASSISTANCE FOR SPINAL PROCEDURE;  Surgeon: Colon Shove, MD;  Location: [facility] OR;  Service: Neurosurgery;  Laterality: N/A;   CARPAL TUNNEL RELEASE     BILATERAL   CATARACT EXTRACTION W/ INTRAOCULAR LENS  IMPLANT, BILATERAL     CESAREAN SECTION     X 3   CHOLECYSTECTOMY     CYSTOSCOPY WITH URETEROSCOPY Right 12/03/2013   Procedure: CYSTOSCOPY WITH RIGHT RETROGRADE URETEROSCOPY LASER LITHOTRIPSY RIGHT STONE RIGHT URETERAL STENT, ;  Surgeon: Gretel Ferrara, MD;  Location: [facility];  Service:  Urology;  Laterality: Right;  PROCEDURE WAS ORIGINALLY SCHEDULED AS RIGHT PERCUTANEOUS NEPHROLITHOTOMY   EYELID LACERATION REPAIR     RIGHT EYE   HERNIA REPAIR     ABDOMINAL HERNIA REPAIR WITH MESH - 3 SURGERIES    HX OF 19 SURGERIES FOR NECROTIC FASCITIS     SKIN GRAFTS+ WOUND  VAC   I&D OF INFECTED SITE IN [place] - FROM AN INJECTION     INCISION AND DRAINAGE ABSCESS Left 07/23/2022   Procedure: INCISION AND DRAINAGE ABSCESS;  Surgeon: Eletha Boas, MD;  Location: [facility];  Service: General;  Laterality: Left;   IR FLUORO GUIDED NEEDLE PLC ASPIRATION/INJECTION LOC  06/03/2018   JOINT REPLACEMENT     TOTAL RIGHT KNEE REPLACEMENT   KNEE ARTHROSCOPY  RIGHT AND LEFT   X 2   LAPAROSCOPIC GASTRIC SLEEVE RESECTION  2014   NEPHROLITHOTOMY Right 12/10/2013   Procedure: NEPHROLITHOTOMY PERCUTANEOUS;  Surgeon: Gretel Ferrara, MD;  Location: [facility];  Service: Urology;  Laterality: Right;   POSTERIOR LUMBAR FUSION 4 LEVEL N/A 07/28/2018   Procedure: Posterior Fixation Thoracic Ten-Lumbar Four with pedicle augmentaion with robotic assistance;  Surgeon: Colon Shove, MD;  Location: [facility] OR;  Service: Neurosurgery;  Laterality: N/A;  Posterior Fixation Thoracic Ten-Lumbar Four with pedicle augmentaion with robotic assistance   REVERSE SHOULDER ARTHROPLASTY Right 07/21/2017   REVERSE SHOULDER ARTHROPLASTY Right 07/21/2017   Procedure: RIGHT REVERSE SHOULDER ARTHROPLASTY;  Surgeon: Dozier Soulier, MD;  Location: [facility] OR;  Service: Orthopedics;  Laterality: Right;   RIGHT FOOT DRAINAGE OF INFECTION     shoulder arthroscopy Right    X 2   TONSILLECTOMY     AND ADENOIDECTOMY   TOTAL KNEE ARTHROPLASTY Left 03/01/2023   Procedure: LEFT KNEE TOTAL KNEE ARTHROPLASTY;  Surgeon: Sheril Coy, MD;  Location: [facility];  Service: Orthopedics;  Laterality: Left;    Social History: Social History   Tobacco Use   Smoking status: Never    Passive exposure: Past   Smokeless tobacco: Never  Vaping Use  Vaping status:  Never Used  Substance Use Topics   Alcohol use: No   Drug use: No    Family History: Family History  Problem Relation Age of Onset   Stroke Mother    Bladder Cancer Mother    Diabetes Father    Osteoarthritis Father    Heart disease Father    Ulcers Father    Suicidality Sister    Colon cancer Neg Hx    Stomach cancer Neg Hx    Rectal cancer Neg Hx    Esophageal cancer Neg Hx    Pancreatic cancer Neg Hx     ROS:   Review of Systems  Constitutional: Positive for malaise/fatigue.  Cardiovascular:  Positive for dyspnea on exertion. Negative for chest pain, claudication, irregular heartbeat, leg swelling, near-syncope, orthopnea, palpitations, paroxysmal nocturnal dyspnea and syncope.  Respiratory:  Negative for shortness of breath.   Hematologic/Lymphatic: Negative for bleeding problem.    EKGs/Labs/Other Studies Reviewed:   EKG: EKG Interpretation Date/Time:  Tuesday December 06 2023 15:39:02 EDT Ventricular Rate:  67 PR Interval:  170 QRS Duration:  80 QT Interval:  414 QTC Calculation: 437 R Axis:   47  Text Interpretation: Normal sinus rhythm Low voltage QRS Cannot rule out Anterior infarct , age undetermined When compared with ECG of 21-Jul-2022 22:30, Low voltage QRS IS NEW SINCE last tracing Confirmed by Michele Richardson [phone]) on 12/06/2023 4:06:02 PM  Echocardiogram: 02/2022  1. Left ventricular ejection fraction, by estimation, is 55 to 60%. The left ventricle has normal function. The left ventricle has no regional wall motion abnormalities. Left ventricular diastolic parameters are consistent with Grade I diastolic  dysfunction (impaired relaxation). Elevated left atrial pressure.   2. Right ventricular systolic function is normal. The right ventricular size is normal.   3. Left atrial size was mildly dilated.   4. The mitral valve is normal in structure. Mild mitral valve regurgitation.   5. The aortic valve is tricuspid. Aortic valve regurgitation is mild.    6. The inferior vena cava is normal in size with greater than 50% respiratory variability, suggesting right atrial pressure of 3 mmHg.   Stress Testing:  None   Labs:    Latest Ref Rng & Units 10/21/2023   10:23 AM 05/10/2023    3:57 PM 03/03/2023    3:31 AM  CBC  WBC 4.0 - 10.5 K/uL 1.7  2.1  3.4   Hemoglobin 12.0 - 15.0 g/dL 89.8  89.2  9.6   Hematocrit 36.0 - 46.0 % 30.9  31.8  28.7   Platelets 150 - 400 K/uL 107  112  109        Latest Ref Rng & Units 10/26/2023    9:32 AM 05/10/2023    3:57 PM 03/02/2023    3:26 AM  BMP  Glucose 70 - 99 mg/dL 870  849  834   BUN 6 - 23 mg/dL 31  36  45   Creatinine 0.40 - 1.20 mg/dL 8.42  8.74  8.45   Sodium 135 - 145 mEq/L 139  139  135   Potassium 3.5 - 5.1 mEq/L 4.2  4.0  4.7   Chloride 96 - 112 mEq/L 102  106  102   CO2 19 - 32 mEq/L 28  27  23    Calcium  8.4 - 10.5 mg/dL 9.4  9.4  8.8       Latest Ref Rng & Units 10/26/2023    9:32 AM 05/10/2023    3:57 PM 03/02/2023  3:26 AM  CMP  Glucose 70 - 99 mg/dL 870  849  834   BUN 6 - 23 mg/dL 31  36  45   Creatinine 0.40 - 1.20 mg/dL 8.42  8.74  8.45   Sodium 135 - 145 mEq/L 139  139  135   Potassium 3.5 - 5.1 mEq/L 4.2  4.0  4.7   Chloride 96 - 112 mEq/L 102  106  102   CO2 19 - 32 mEq/L 28  27  23    Calcium  8.4 - 10.5 mg/dL 9.4  9.4  8.8   Total Protein 6.0 - 8.3 g/dL 7.5  7.0    Total Bilirubin 0.2 - 1.2 mg/dL 0.5  0.4    Alkaline Phos 39 - 117 U/L 131  105    AST 0 - 37 U/L 23  14    ALT 0 - 35 U/L 21  12      No results found for: CHOL, HDL, LDLCALC, LDLDIRECT, TRIG, CHOLHDL No results for input(s): LIPOA in the last 8760 hours. No components found for: NTPROBNP No results for input(s): PROBNP in the last 8760 hours. No results for input(s): TSH in the last 8760 hours.  External Labs: Collected: December 30, 2022 [facility] database. Total cholesterol 195, triglycerides 287, HDL 36, LDL 109  Collected: October 26, 2023, [facility] database. Creatinine 1.57. eGFR  33. BUN 31. INR 1.2. Potassium 4.2  Physical Exam:    Today's Vitals   12/06/23 1536 12/06/23 1613  BP: 92/62 132/72  Pulse: 66 88  SpO2: 97%   Weight: 227 lb 3.2 oz (103.1 kg)   Height: 5' (1.524 m)    Body mass index is 44.37 kg/m. Wt Readings from Last 3 Encounters:  12/06/23 227 lb 3.2 oz (103.1 kg)  10/26/23 248 lb 6 oz (112.7 kg)  10/21/23 239 lb 4.8 oz (108.5 kg)    Physical Exam  Constitutional: No distress. She appears chronically ill.  hemodynamically stable, presents in a wheelchair, ambulates with a cane  Neck: No JVD present.  Cardiovascular: Normal rate, regular rhythm, S1 normal and S2 normal. Exam reveals no gallop, no S3 and no S4.  No murmur heard. Pulses:      Carotid pulses are  on the right side with bruit and  on the left side with bruit. Pulmonary/Chest: Effort normal and breath sounds normal. No stridor. She has no wheezes. She has no rales.  Abdominal: Soft. Bowel sounds are normal. She exhibits no distension. There is no abdominal tenderness.  Abdominal obesity  Musculoskeletal:        General: No edema.     Cervical back: Neck supple.  Skin: Skin is warm.    Impression & Recommendation(s):  Impression:   ICD-10-CM   1. Dyspnea on exertion  R06.09 ECHOCARDIOGRAM COMPLETE    NM PET CT CARDIAC PERFUSION MULTI W/ABSOLUTE BLOODFLOW    VAS US  CAROTID    Basic metabolic panel with GFR    Pro b natriuretic peptide (BNP)    Pro b natriuretic peptide (BNP)    Basic metabolic panel with GFR    2. Aortic atherosclerosis (HCC)  I70.0     3. Benign hypertension  I10 EKG 12-Lead    4. OSA on CPAP  G47.33     5. Preop cardiovascular exam  Z01.810 ECHOCARDIOGRAM COMPLETE    NM PET CT CARDIAC PERFUSION MULTI W/ABSOLUTE BLOODFLOW    VAS US  CAROTID    Basic metabolic panel with GFR    Pro b  natriuretic peptide (BNP)    Pro b natriuretic peptide (BNP)    Basic metabolic panel with GFR    6. Bilateral carotid bruits  R09.89 VAS US  CAROTID    7.  Type 2 diabetes mellitus with obesity (HCC)  E11.69    E66.9     8. Pancytopenia (HCC)  D61.818        Recommendation(s):  Dyspnea on exertion Aortic atherosclerosis (HCC) Shortness of breath for 3-4 months, exacerbated by walking. Differential includes cardiac causes, electrolyte imbalances, low hemoglobin, and lung issues. Low hemoglobin may contribute to symptoms. Patient understands she is at an elevated risk for procedures due to significant co-morbidities.  - Order echocardiogram to assess cardiac function. - Order cardiac PET CT stress test to evaluate for coronary artery disease. - Order blood work to evaluate fluid markers and other relevant parameters. - Clinically euvolemic on physical examination, not in acute decompensated heart failure.  Benign hypertension Initial blood pressures were soft with SBP in the low 90 mmHg. Upon recheck blood pressure had improved. She is currently on doxazosin  2 mg p.o. daily, recommended that she reduce it to 1 mg p.o. daily and follow-up with PCP with regards to weaning off.  Her GDMT could be uptitrated with cardioprotective medications.  Will hold off until the results of the echocardiogram.  OSA on CPAP Reemphasize importance of being compliant with her CPAP.  Preop cardiovascular exam Being considered for colonoscopy. Referred to cardiology for preprocedural risk stratification Given the fact that she does not perform 4 METS of activity level, has progressive dyspnea with effort related activities and multiple cardiovascular risk factors we will proceed with ischemic workup as discussed above. Further recommendations to follow  Bilateral carotid bruits Carotid duplex for further evaluation  Type 2 diabetes mellitus with obesity (HCC) Reemphasized importance of glycemic control. Currently on ARB, insulin , fenofibrate  Recommend she discusses with PCP the role of lipid-lowering agents given her comorbidities.   Recommended goal LDL  <70 mg/dL  Pancytopenia (HCC) Remains pancytopenic. Follows up with hematology.   Orders Placed:  Orders Placed This Encounter  Procedures   NM PET CT CARDIAC PERFUSION MULTI W/ABSOLUTE BLOODFLOW    Standing Status:   Future    Expiration Date:   12/05/2024    If indicated for the ordered procedure, I authorize the administration of a radiopharmaceutical per Radiology protocol:   Yes    Preferred Imaging Location:   [facility]   Basic metabolic panel with GFR    Standing Status:   Future    Number of Occurrences:   1    Expiration Date:   12/05/2024   Pro b natriuretic peptide (BNP)    Standing Status:   Future    Number of Occurrences:   1    Expiration Date:   12/05/2024   EKG 12-Lead   ECHOCARDIOGRAM COMPLETE    Standing Status:   Future    Expiration Date:   12/05/2024    [facility]    Does the patient weigh less than or greater than 250 lbs?:   Patient weighs less than 250 lbs    Perflutren  DEFINITY  (image enhancing agent) should be administered unless hypersensitivity or allergy exist:   Administer Perflutren     Reason for exam-Echo:   Other-Full Diagnosis List    Full ICD-10/Reason for Exam:   SOB (shortness of breath) [758119]    Full ICD-10/Reason for Exam:   Pre-op evaluation [309808]  Final Medication List:   No orders of the defined types were placed in this encounter.   Medications Discontinued During This Encounter  Medication Reason   mupirocin  ointment (BACTROBAN ) 2 % Patient has not taken in last 30 days   Lancets Misc. (UNISTIK 3 COMFORT) MISC Patient has not taken in last 30 days   FREESTYLE TEST STRIPS test strip Patient has not taken in last 30 days   fluticasone  (FLONASE ) 50 MCG/ACT nasal spray Patient has not taken in last 30 days   aspirin  EC 81 MG tablet Patient has not taken in last 30 days   acetaminophen  (TYLENOL ) 500 MG tablet      Current Outpatient Medications:    amLODipine   (NORVASC ) 5 MG tablet, Take 5 mg by mouth at bedtime., Disp: , Rfl:    BD INSULIN  SYRINGE U/F 31G X 5/16 0.5 ML MISC, USE 5 TIMES A DAY LEVEMIR  AND NOVOLOG , Disp: , Rfl:    BD PEN NEEDLE MICRO U/F 32G X 6 MM MISC, Inject 32 pens into the skin daily., Disp: , Rfl:    Cholecalciferol  (DIALYVITE VITAMIN D 5000) 125 MCG (5000 UT) capsule, Take 5,000 Units by mouth daily., Disp: , Rfl:    clonazePAM  (KLONOPIN ) 0.5 MG tablet, Take 0.5 mg by mouth See admin instructions. Take 0.5 mg at bedtime, may take an additional 0.5 mg tablet twice daily as needed for anxiety, Disp: , Rfl: 0   CREON  36000-114000 units CPEP capsule, Take 36,000 Units by mouth 3 (three) times daily with meals., Disp: , Rfl:    doxazosin  (CARDURA ) 2 MG tablet, Take 2 mg by mouth daily., Disp: , Rfl:    fenofibrate  54 MG tablet, Take 54 mg by mouth daily with supper., Disp: , Rfl: 2   FLUoxetine  (PROZAC ) 20 MG capsule, Take 40 mg by mouth at bedtime., Disp: , Rfl: 3   folic acid  (FOLVITE ) 1 MG tablet, Take 1 mg by mouth daily., Disp: , Rfl:    furosemide  (LASIX ) 40 MG tablet, Take 40 mg by mouth daily., Disp: , Rfl:    gabapentin  (NEURONTIN ) 300 MG capsule, Take 1 capsule (300 mg total) by mouth 3 (three) times daily for 30 days., Disp: 90 capsule, Rfl: 0   insulin  aspart (NOVOLOG ) 100 UNIT/ML FlexPen, Sliding scale dose: CBG < 70: implement hypoglycemia protocol  CBG 70 - 120: 0 units  CBG 121 - 150: 0 units  CBG 151 - 200: 0 units  CBG 201 - 250: 2 units  CBG 251 - 300: 3 units  CBG 301 - 350: 4 units  CBG 351 - 400: 5 units (Patient taking differently: Inject 20-25 Units into the skin 2 (two) times daily as needed for high blood sugar.), Disp: 15 mL, Rfl: 0   Insulin  Glargine (BASAGLAR  KWIKPEN) 100 UNIT/ML, Inject 22 Units into the skin 2 (two) times daily., Disp: , Rfl:    losartan  (COZAAR ) 25 MG tablet, Take 25 mg by mouth., Disp: , Rfl:    metoprolol  tartrate (LOPRESSOR ) 50 MG tablet, Take 50 mg by mouth 2 (two) times daily., Disp: ,  Rfl:    NON FORMULARY, Pt uses a c-pap nightly, Disp: , Rfl:    nutrition supplement, JUVEN, (JUVEN) PACK, Take 1 packet by mouth 2 (two) times daily between meals., Disp: , Rfl:    omeprazole (PRILOSEC) 20 MG capsule, Take 20 mg by mouth daily with supper. , Disp: , Rfl:    ondansetron  (ZOFRAN -ODT) 4 MG disintegrating tablet, Take 1 tablet (4 mg total)  by mouth every 6 (six) hours as needed for nausea or vomiting., Disp: 30 tablet, Rfl: 0   oxybutynin  (DITROPAN -XL) 10 MG 24 hr tablet, Take 10 mg by mouth daily., Disp: , Rfl:    propylthiouracil  (PTU) 50 MG tablet, Take 50 mg by mouth daily., Disp: , Rfl: 0   rOPINIRole  (REQUIP ) 2 MG tablet, Take 2-3 mg by mouth See admin instructions. Take 2 mg by mouth in the morning and take 3 mg in the evening, Disp: , Rfl:    tiZANidine  (ZANAFLEX ) 4 MG tablet, Take 1 tablet (4 mg total) by mouth at bedtime., Disp: 30 tablet, Rfl: 0   VENTOLIN  HFA 108 (90 Base) MCG/ACT inhaler, Inhale 2 puffs into the lungs every 4 (four) hours as needed for shortness of breath., Disp: , Rfl:   Consent:   Informed Consent   Shared Decision Making/Informed Consent The risks [chest pain, shortness of breath, cardiac arrhythmias, dizziness, blood pressure fluctuations, myocardial infarction, stroke/transient ischemic attack, nausea, vomiting, allergic reaction, radiation exposure, metallic taste sensation and life-threatening complications (estimated to be 1 in 10,000)], benefits (risk stratification, diagnosing coronary artery disease, treatment guidance) and alternatives of a cardiac PET stress test were discussed in detail with Ms. Drewes and she agrees to proceed.     Disposition:   6 weeks or after the workup was complete Patient may be asked to follow-up sooner based on the results of the above-mentioned testing.  Her questions and concerns were addressed to her satisfaction. She voices understanding of the recommendations provided during this encounter.     Signed, Madonna Michele HAS, [facility]  A Division of [facility] [address] [place], [place] [address]  12/06/2023 5:45 PM

## 2023-12-07 DIAGNOSIS — R0609 Other forms of dyspnea: Secondary | ICD-10-CM | POA: Diagnosis not present

## 2023-12-07 DIAGNOSIS — Z01810 Encounter for preprocedural cardiovascular examination: Secondary | ICD-10-CM | POA: Diagnosis not present

## 2023-12-08 ENCOUNTER — Ambulatory Visit: Payer: Self-pay | Admitting: Cardiology

## 2023-12-08 LAB — BASIC METABOLIC PANEL WITH GFR
BUN/Creatinine Ratio: 21 (ref 12–28)
BUN: 34 mg/dL — ABNORMAL HIGH (ref 8–27)
CO2: 22 mmol/L (ref 20–29)
Calcium: 9.4 mg/dL (ref 8.7–10.3)
Chloride: 99 mmol/L (ref 96–106)
Creatinine, Ser: 1.59 mg/dL — ABNORMAL HIGH (ref 0.57–1.00)
Glucose: 102 mg/dL — ABNORMAL HIGH (ref 70–99)
Potassium: 4.3 mmol/L (ref 3.5–5.2)
Sodium: 137 mmol/L (ref 134–144)
eGFR: 35 mL/min/1.73 — ABNORMAL LOW (ref >59)

## 2023-12-08 LAB — PRO B NATRIURETIC PEPTIDE: NT-Pro BNP: 167 pg/mL (ref 0–301)

## 2023-12-14 ENCOUNTER — Ambulatory Visit (HOSPITAL_COMMUNITY)

## 2023-12-28 ENCOUNTER — Telehealth: Payer: Self-pay | Admitting: *Deleted

## 2023-12-28 NOTE — Telephone Encounter (Signed)
 Received vm message from pt. She is asking if she can get a flu shot.  We see her for pancytopenia. Last WBC done here at [facility] was 1.7 with ANC 0.8 on 10/21/23 Dr. Federico made aware of her request

## 2023-12-28 NOTE — Telephone Encounter (Signed)
 Received vm message from pt. She is asking if she can get the flu shot this year.  Discussed with Dr. Federico. He states she can get the flu shot this year. TCT patient and spoke with her. Advised of the above from Dr. Federico. She voiced understanding.

## 2023-12-30 DIAGNOSIS — Z23 Encounter for immunization: Secondary | ICD-10-CM | POA: Diagnosis not present

## 2024-01-06 ENCOUNTER — Ambulatory Visit (HOSPITAL_COMMUNITY)
Admission: RE | Admit: 2024-01-06 | Discharge: 2024-01-06 | Disposition: A | Discharge status: Home / Self Care | Source: Ambulatory Visit | Attending: Cardiology | Admitting: Cardiology

## 2024-01-06 ENCOUNTER — Ambulatory Visit (HOSPITAL_BASED_OUTPATIENT_CLINIC_OR_DEPARTMENT_OTHER)
Admission: RE | Admit: 2024-01-06 | Discharge: 2024-01-06 | Disposition: A | Discharge status: Home / Self Care | Source: Ambulatory Visit | Attending: Cardiology | Admitting: Cardiology

## 2024-01-06 ENCOUNTER — Ambulatory Visit: Admitting: Cardiology

## 2024-01-06 DIAGNOSIS — R0609 Other forms of dyspnea: Secondary | ICD-10-CM | POA: Diagnosis present

## 2024-01-06 DIAGNOSIS — R0989 Other specified symptoms and signs involving the circulatory and respiratory systems: Secondary | ICD-10-CM | POA: Diagnosis not present

## 2024-01-06 DIAGNOSIS — Z01810 Encounter for preprocedural cardiovascular examination: Secondary | ICD-10-CM | POA: Diagnosis not present

## 2024-01-06 LAB — ECHOCARDIOGRAM COMPLETE
AR max vel: 1.53 cm2
AV Area VTI: 1.5 cm2
AV Area mean vel: 1.69 cm2
AV Mean grad: 12 mmHg
AV Peak grad: 22.3 mmHg
Ao pk vel: 2.36 m/s
Area-P 1/2: 2.48 cm2
MV VTI: 2.14 cm2
P 1/2 time: 490 ms
S' Lateral: 2.18 cm

## 2024-01-11 ENCOUNTER — Inpatient Hospital Stay (HOSPITAL_COMMUNITY): Admission: RE | Admit: 2024-01-11 | Source: Ambulatory Visit

## 2024-01-13 DIAGNOSIS — Z23 Encounter for immunization: Secondary | ICD-10-CM | POA: Diagnosis not present

## 2024-01-16 ENCOUNTER — Encounter (HOSPITAL_COMMUNITY): Payer: Self-pay

## 2024-01-17 DIAGNOSIS — N1832 Chronic kidney disease, stage 3b: Secondary | ICD-10-CM | POA: Diagnosis not present

## 2024-01-18 ENCOUNTER — Encounter (HOSPITAL_COMMUNITY)
Admission: RE | Admit: 2024-01-18 | Discharge: 2024-01-18 | Disposition: A | Discharge status: Home / Self Care | Source: Ambulatory Visit | Attending: Cardiology | Admitting: Cardiology

## 2024-01-18 DIAGNOSIS — Z01810 Encounter for preprocedural cardiovascular examination: Secondary | ICD-10-CM | POA: Insufficient documentation

## 2024-01-18 DIAGNOSIS — R0609 Other forms of dyspnea: Secondary | ICD-10-CM | POA: Insufficient documentation

## 2024-01-18 DIAGNOSIS — N1832 Chronic kidney disease, stage 3b: Secondary | ICD-10-CM | POA: Diagnosis not present

## 2024-01-18 MED ORDER — RUBIDIUM RB82 GENERATOR (RUBYFILL)
26.5900 | PACK | Freq: Once | INTRAVENOUS | Status: AC
  Administered 2024-01-18: 26.59 via INTRAVENOUS

## 2024-01-18 MED ORDER — RUBIDIUM RB82 GENERATOR (RUBYFILL)
26.6100 | PACK | Freq: Once | INTRAVENOUS | Status: AC
  Administered 2024-01-18: 26.61 via INTRAVENOUS

## 2024-01-18 MED ORDER — REGADENOSON 0.4 MG/5ML IV SOLN
0.4000 mg | Freq: Once | INTRAVENOUS | Status: AC
  Administered 2024-01-18: 0.4 mg via INTRAVENOUS

## 2024-01-19 LAB — NM PET CT CARDIAC PERFUSION MULTI W/ABSOLUTE BLOODFLOW
MBFR: 1.29
Nuc Rest EF: 57 %
Nuc Stress EF: 66 %
Peak HR: 86 {beats}/min
Rest HR: 71 {beats}/min
Rest MBF: 1.13 ml/g/min
Rest Nuclear Isotope Dose: 26.6 mCi
ST Depression (mm): 0 mm
Stress MBF: 1.46 ml/g/min
Stress Nuclear Isotope Dose: 26.6 mCi
TID: 1.07

## 2024-01-23 DIAGNOSIS — N1832 Chronic kidney disease, stage 3b: Secondary | ICD-10-CM | POA: Diagnosis not present

## 2024-01-23 DIAGNOSIS — I129 Hypertensive chronic kidney disease with stage 1 through stage 4 chronic kidney disease, or unspecified chronic kidney disease: Secondary | ICD-10-CM | POA: Diagnosis not present

## 2024-01-23 DIAGNOSIS — D638 Anemia in other chronic diseases classified elsewhere: Secondary | ICD-10-CM | POA: Diagnosis not present

## 2024-01-23 DIAGNOSIS — N2581 Secondary hyperparathyroidism of renal origin: Secondary | ICD-10-CM | POA: Diagnosis not present

## 2024-01-24 ENCOUNTER — Other Ambulatory Visit: Payer: Self-pay | Admitting: Hematology and Oncology

## 2024-01-24 DIAGNOSIS — D500 Iron deficiency anemia secondary to blood loss (chronic): Secondary | ICD-10-CM

## 2024-01-25 ENCOUNTER — Inpatient Hospital Stay: Attending: Hematology and Oncology

## 2024-01-25 DIAGNOSIS — D500 Iron deficiency anemia secondary to blood loss (chronic): Secondary | ICD-10-CM

## 2024-01-25 DIAGNOSIS — D509 Iron deficiency anemia, unspecified: Secondary | ICD-10-CM | POA: Diagnosis not present

## 2024-01-25 LAB — CBC WITH DIFFERENTIAL (CANCER CENTER ONLY)
Abs Immature Granulocytes: 0.01 K/uL (ref 0.00–0.07)
Basophils Absolute: 0 K/uL (ref 0.0–0.1)
Basophils Relative: 0 %
Eosinophils Absolute: 0 K/uL (ref 0.0–0.5)
Eosinophils Relative: 0 %
HCT: 34.3 % — ABNORMAL LOW (ref 36.0–46.0)
Hemoglobin: 11.3 g/dL — ABNORMAL LOW (ref 12.0–15.0)
Immature Granulocytes: 1 %
Lymphocytes Relative: 33 %
Lymphs Abs: 0.7 K/uL (ref 0.7–4.0)
MCH: 27.1 pg (ref 26.0–34.0)
MCHC: 32.9 g/dL (ref 30.0–36.0)
MCV: 82.3 fL (ref 80.0–100.0)
Monocytes Absolute: 0.2 K/uL (ref 0.1–1.0)
Monocytes Relative: 8 %
Neutro Abs: 1.2 K/uL — ABNORMAL LOW (ref 1.7–7.7)
Neutrophils Relative %: 58 %
Platelet Count: 120 K/uL — ABNORMAL LOW (ref 150–400)
RBC: 4.17 MIL/uL (ref 3.87–5.11)
RDW: 14.2 % (ref 11.5–15.5)
WBC Count: 2 K/uL — ABNORMAL LOW (ref 4.0–10.5)
nRBC: 0 % (ref 0.0–0.2)

## 2024-01-25 LAB — CMP (CANCER CENTER ONLY)
ALT: 14 U/L (ref 0–44)
AST: 16 U/L (ref 15–41)
Albumin: 4.2 g/dL (ref 3.5–5.0)
Alkaline Phosphatase: 138 U/L — ABNORMAL HIGH (ref 38–126)
Anion gap: 7 (ref 5–15)
BUN: 33 mg/dL — ABNORMAL HIGH (ref 8–23)
CO2: 26 mmol/L (ref 22–32)
Calcium: 9.7 mg/dL (ref 8.9–10.3)
Chloride: 104 mmol/L (ref 98–111)
Creatinine: 1.4 mg/dL — ABNORMAL HIGH (ref 0.44–1.00)
GFR, Estimated: 40 mL/min — ABNORMAL LOW (ref >60)
Glucose, Bld: 200 mg/dL — ABNORMAL HIGH (ref 70–99)
Potassium: 4.3 mmol/L (ref 3.5–5.1)
Sodium: 137 mmol/L (ref 135–145)
Total Bilirubin: 0.4 mg/dL (ref 0.0–1.2)
Total Protein: 7.8 g/dL (ref 6.5–8.1)

## 2024-01-25 LAB — RETIC PANEL
Immature Retic Fract: 14.2 % (ref 2.3–15.9)
RBC.: 4.14 MIL/uL (ref 3.87–5.11)
Retic Count, Absolute: 86.1 K/uL (ref 19.0–186.0)
Retic Ct Pct: 2.1 % (ref 0.4–3.1)
Reticulocyte Hemoglobin: 30.7 pg (ref >27.9)

## 2024-01-25 LAB — IRON AND IRON BINDING CAPACITY (CC-WL,HP ONLY)
Iron: 59 ug/dL (ref 28–170)
Saturation Ratios: 16 % (ref 10.4–31.8)
TIBC: 364 ug/dL (ref 250–450)
UIBC: 305 ug/dL (ref 148–442)

## 2024-01-25 LAB — FERRITIN: Ferritin: 65 ng/mL (ref 11–307)

## 2024-01-26 ENCOUNTER — Ambulatory Visit: Attending: Cardiology | Admitting: Cardiology

## 2024-01-26 ENCOUNTER — Encounter: Payer: Self-pay | Admitting: Cardiology

## 2024-01-26 VITALS — BP 138/60 | HR 66 | Resp 16 | Ht 60.0 in | Wt 232.0 lb

## 2024-01-26 DIAGNOSIS — I10 Essential (primary) hypertension: Secondary | ICD-10-CM | POA: Diagnosis not present

## 2024-01-26 DIAGNOSIS — G4733 Obstructive sleep apnea (adult) (pediatric): Secondary | ICD-10-CM | POA: Insufficient documentation

## 2024-01-26 DIAGNOSIS — R0609 Other forms of dyspnea: Secondary | ICD-10-CM | POA: Diagnosis not present

## 2024-01-26 DIAGNOSIS — I700 Atherosclerosis of aorta: Secondary | ICD-10-CM | POA: Insufficient documentation

## 2024-01-26 DIAGNOSIS — Z01810 Encounter for preprocedural cardiovascular examination: Secondary | ICD-10-CM | POA: Diagnosis not present

## 2024-01-26 DIAGNOSIS — I6523 Occlusion and stenosis of bilateral carotid arteries: Secondary | ICD-10-CM | POA: Insufficient documentation

## 2024-01-26 DIAGNOSIS — D61818 Other pancytopenia: Secondary | ICD-10-CM | POA: Diagnosis not present

## 2024-01-26 DIAGNOSIS — Z789 Other specified health status: Secondary | ICD-10-CM | POA: Insufficient documentation

## 2024-01-26 MED ORDER — NEXLIZET 180-10 MG PO TABS: 1.0000 | ORAL_TABLET | Freq: Every day | ORAL | 11 refills | Status: AC

## 2024-01-26 NOTE — Progress Notes (Addendum)
 Cardiology Office Note:    NAME:  [name]    MRN: [id number] DOB:  02-Aug-1950   PCP:  Shayne Anes, MD  Former Cardiology Providers: None Primary Cardiologist:  Madonna Large, DO, [facility] (established care 12/06/2023) Electrophysiologist:  None   Chief Complaint  Patient presents with   Shortness of Breath   Follow-up    History of Present Illness:    [name] is a 73 y.o. Caucasian female with a complex past medical history which includes: Carotid artery atherosclerosis, aortic atherosclerosis, diabetes, fibromyalgia, iron  deficiency anemia and PRBC, gastric sleeve (5-10 years ago), CKD,NASH, CHF, obesity and OSA.   In August 2025 patient was evaluated by Dr. San and they had discussions with regards to undergoing repeat colonoscopy for surveillance given her history of adenoma.  But given her lower extremity swelling, shortness of breath, pancytopenia they recommended cardiovascular evaluation prior to scheduling colonoscopy as she is at elevated risk from sedation and volume shifts with bowel preparation.  Patient had labs on December 07, 2023 which noted stable renal function, NT-proBNP was within normal limits not suggestive of congestive heart failure.  Her carotid duplex notes bilateral carotid artery atherosclerosis.  Other imaging studies also note aortic atherosclerosis.  Recommended lipid-lowering agents.  She has been intolerant to statin therapy in the past and therefore recommended nonstatin options and also a discussion with Pharm.D. team for close follow-up.  Results of the cardiac PET/CT as well as echocardiogram reviewed with her in great detail and mentioned below for reference.  She denies anginal chest pain. Her shortness of breath remains relatively stable, no change in intensity frequency or duration.  Overall functional capacity is limited as she uses a wheelchair or cane for mobility.  Current Medications: Current Meds  Medication Sig    amLODipine  (NORVASC ) 5 MG tablet Take 5 mg by mouth at bedtime.   BD INSULIN  SYRINGE U/F 31G X 5/16 0.5 ML MISC USE 5 TIMES A DAY LEVEMIR  AND NOVOLOG    BD PEN NEEDLE MICRO U/F 32G X 6 MM MISC Inject 32 pens into the skin daily.   Bempedoic Acid-Ezetimibe  (NEXLIZET) 180-10 MG TABS Take 1 tablet by mouth daily.   Cholecalciferol  (DIALYVITE VITAMIN D 5000) 125 MCG (5000 UT) capsule Take 5,000 Units by mouth daily.   clonazePAM  (KLONOPIN ) 0.5 MG tablet Take 0.5 mg by mouth See admin instructions. Take 0.5 mg at bedtime, may take an additional 0.5 mg tablet twice daily as needed for anxiety   CREON  36000-114000 units CPEP capsule Take 36,000 Units by mouth 3 (three) times daily with meals.   fenofibrate  54 MG tablet Take 54 mg by mouth daily with supper.   FLUoxetine  (PROZAC ) 20 MG capsule Take 40 mg by mouth at bedtime.   folic acid  (FOLVITE ) 1 MG tablet Take 1 mg by mouth daily.   furosemide  (LASIX ) 40 MG tablet Take 40 mg by mouth daily.   gabapentin  (NEURONTIN ) 300 MG capsule Take 1 capsule (300 mg total) by mouth 3 (three) times daily for 30 days.   insulin  aspart (NOVOLOG ) 100 UNIT/ML FlexPen Sliding scale dose: CBG < 70: implement hypoglycemia protocol  CBG 70 - 120: 0 units  CBG 121 - 150: 0 units  CBG 151 - 200: 0 units  CBG 201 - 250: 2 units  CBG 251 - 300: 3 units  CBG 301 - 350: 4 units  CBG 351 - 400: 5 units (Patient taking differently: Inject 20-25 Units into the skin 2 (two) times daily as  needed for high blood sugar.)   Insulin  Glargine (BASAGLAR  KWIKPEN) 100 UNIT/ML Inject 22 Units into the skin 2 (two) times daily.   metoprolol  tartrate (LOPRESSOR ) 50 MG tablet Take 50 mg by mouth 2 (two) times daily.   NON FORMULARY Pt uses a c-pap nightly   nutrition supplement, JUVEN, (JUVEN) PACK Take 1 packet by mouth 2 (two) times daily between meals.   omeprazole (PRILOSEC) 20 MG capsule Take 20 mg by mouth daily with supper.    ondansetron  (ZOFRAN -ODT) 4 MG disintegrating tablet Take  1 tablet (4 mg total) by mouth every 6 (six) hours as needed for nausea or vomiting.   oxybutynin  (DITROPAN -XL) 10 MG 24 hr tablet Take 10 mg by mouth daily.   propylthiouracil  (PTU) 50 MG tablet Take 50 mg by mouth daily.   rOPINIRole  (REQUIP ) 2 MG tablet Take 2-3 mg by mouth See admin instructions. Take 2 mg by mouth in the morning and take 3 mg in the evening   tiZANidine  (ZANAFLEX ) 4 MG tablet Take 1 tablet (4 mg total) by mouth at bedtime.   VENTOLIN  HFA 108 (90 Base) MCG/ACT inhaler Inhale 2 puffs into the lungs every 4 (four) hours as needed for shortness of breath.     Allergies:    Morphine , Promethazine hcl, Sumatriptan, Codeine, Hydrocodone -acetaminophen , Misc. sulfonamide containing compounds, Nsaids, Statins, Sulfonamide derivatives, Methimazole, and Niacin   Past Medical History: Past Medical History:  Diagnosis Date   Anemia    Arthritis    Asthma    OCCAS   Bell's palsy    Broken neck (HCC)    C-1   Cancer (HCC)    left ear squamus cell   Carbuncle and furuncle of trunk    Chronic dislocation of right shoulder    Depression    Disorder of fascia    HX OF NECROTIC FASCITIS AFTER ABDOMINAL SURGERY FOR HERNIA- REQUIRED 19 SURGERIES AND 2.5 MONTH HOSPITALIZATION AT [facility]   DM (diabetes mellitus) (HCC)    Dysrhythmia    HX OF TACHYCARDIA AND BRADYCARDIA - ON GOING FOR YEARS - DOES NOT HAVE TO SEE CARDIOLOGIST   Elevated cholesterol    Fibromyalgia    Fracture 07/2012   HX OF FRACTURED NECK C1- CAUSES SEVERE HEADACHES--LIMITED ROM NECK   Frequent infections    ESPECIALLY PRONE TO INFECTIONS AFTER SURGERIES   Graves disease    Heart murmur    DOES NOT CAUSE ANY PROBLEMS   History of kidney stones    Hyperlipidemia    Hypertension    Hypothyroidism    GRAVES DISEASE   Migraine    Nephrolithiasis    STAGE 3    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   Neuropathy    Obesity    OSA (obstructive sleep apnea)    USES CPAP - DOES NOT KNOW SETTING   Pain    LEFT SHOULDER   PAIN -HARD TO LIE ON LEFT SIDE FOR LONG PERIOD;  PAIN IN LOWER BADK - 3 HERNIATED DISCS AND STENOISIS -   Pneumonia    PONV (postoperative nausea and vomiting)    THE GAS MAKES ME NAUSEATED   Restless leg syndrome    Sciatica    Tachycardia    Urinary frequency    Urticaria    UTI (urinary tract infection)     Past Surgical History: Past Surgical History:  Procedure Laterality Date   ABDOMINAL HYSTERECTOMY     LARGE TUMOR AT OVARY REMOVED   ANTERIOR LAT LUMBAR FUSION N/A 07/28/2018  Procedure: Anterolateral Decompression Lumbar One-Two for osteomyelitis reconstruction w/titanium strut allograft Fusion Lumbar One-Two;  Surgeon: Colon Shove, MD;  Location: [facility] OR;  Service: Neurosurgery;  Laterality: N/A;  Left anterolateral approach   APPENDECTOMY     APPLICATION OF ROBOTIC ASSISTANCE FOR SPINAL PROCEDURE N/A 07/28/2018   Procedure: APPLICATION OF ROBOTIC ASSISTANCE FOR SPINAL PROCEDURE;  Surgeon: Colon Shove, MD;  Location: [facility] OR;  Service: Neurosurgery;  Laterality: N/A;   CARPAL TUNNEL RELEASE     BILATERAL   CATARACT EXTRACTION W/ INTRAOCULAR LENS  IMPLANT, BILATERAL     CESAREAN SECTION     X 3   CHOLECYSTECTOMY     CYSTOSCOPY WITH URETEROSCOPY Right 12/03/2013   Procedure: CYSTOSCOPY WITH RIGHT RETROGRADE URETEROSCOPY LASER LITHOTRIPSY RIGHT STONE RIGHT URETERAL STENT, ;  Surgeon: Gretel Ferrara, MD;  Location: [facility];  Service: Urology;  Laterality: Right;  PROCEDURE WAS ORIGINALLY SCHEDULED AS RIGHT PERCUTANEOUS NEPHROLITHOTOMY   EYELID LACERATION REPAIR     RIGHT EYE   HERNIA REPAIR     ABDOMINAL HERNIA REPAIR WITH MESH - 3 SURGERIES    HX OF 19 SURGERIES FOR NECROTIC FASCITIS     SKIN GRAFTS+ WOUND  VAC   I&D OF INFECTED SITE IN [place] - FROM AN INJECTION     INCISION AND DRAINAGE ABSCESS Left 07/23/2022   Procedure: INCISION AND DRAINAGE ABSCESS;  Surgeon: Eletha Boas, MD;  Location: [facility];  Service: General;  Laterality: Left;   IR FLUORO GUIDED NEEDLE PLC  ASPIRATION/INJECTION LOC  06/03/2018   JOINT REPLACEMENT     TOTAL RIGHT KNEE REPLACEMENT   KNEE ARTHROSCOPY  RIGHT AND LEFT   X 2   LAPAROSCOPIC GASTRIC SLEEVE RESECTION  2014   NEPHROLITHOTOMY Right 12/10/2013   Procedure: NEPHROLITHOTOMY PERCUTANEOUS;  Surgeon: Gretel Ferrara, MD;  Location: [facility];  Service: Urology;  Laterality: Right;   POSTERIOR LUMBAR FUSION 4 LEVEL N/A 07/28/2018   Procedure: Posterior Fixation Thoracic Ten-Lumbar Four with pedicle augmentaion with robotic assistance;  Surgeon: Colon Shove, MD;  Location: [facility] OR;  Service: Neurosurgery;  Laterality: N/A;  Posterior Fixation Thoracic Ten-Lumbar Four with pedicle augmentaion with robotic assistance   REVERSE SHOULDER ARTHROPLASTY Right 07/21/2017   REVERSE SHOULDER ARTHROPLASTY Right 07/21/2017   Procedure: RIGHT REVERSE SHOULDER ARTHROPLASTY;  Surgeon: Dozier Soulier, MD;  Location: [facility] OR;  Service: Orthopedics;  Laterality: Right;   RIGHT FOOT DRAINAGE OF INFECTION     shoulder arthroscopy Right    X 2   TONSILLECTOMY     AND ADENOIDECTOMY   TOTAL KNEE ARTHROPLASTY Left 03/01/2023   Procedure: LEFT KNEE TOTAL KNEE ARTHROPLASTY;  Surgeon: Sheril Coy, MD;  Location: [facility];  Service: Orthopedics;  Laterality: Left;    Social History: Social History   Tobacco Use   Smoking status: Never    Passive exposure: Past   Smokeless tobacco: Never  Vaping Use   Vaping status: Never Used  Substance Use Topics   Alcohol use: No   Drug use: No    Family History: Family History  Problem Relation Age of Onset   Stroke Mother    Bladder Cancer Mother    Diabetes Father    Osteoarthritis Father    Heart disease Father    Ulcers Father    Suicidality Sister    Colon cancer Neg Hx    Stomach cancer Neg Hx    Rectal cancer Neg Hx    Esophageal cancer Neg Hx    Pancreatic cancer Neg Hx     ROS:  Review of Systems  Constitutional: Positive for malaise/fatigue.  Cardiovascular:  Positive for dyspnea on  exertion. Negative for chest pain, claudication, irregular heartbeat, leg swelling, near-syncope, orthopnea, palpitations, paroxysmal nocturnal dyspnea and syncope.  Respiratory:  Negative for shortness of breath.   Hematologic/Lymphatic: Negative for bleeding problem.    EKGs/Labs/Other Studies Reviewed:   EKG: EKG Interpretation Date/Time:                  Tuesday December 06 2023 15:39:02 EDT Normal sinus rhythm Low voltage QRS Cannot rule out Anterior infarct , age undetermined When compared with ECG of 21-Jul-2022 22:30, Low voltage QRS IS NEW SINCE last tracing   Echocardiogram: 12/21/2023 1. This study had very poor sound transmission and there was no contrast used, which greatly limits the interpretation of this exam. Left ventricular ejection fraction, by estimation, is 55 to 60%. Left ventricular ejection fraction by 3D volume is 59  %. The left ventricle has normal function. The left ventricle has no regional wall motion abnormalities. There is mild concentric left ventricular hypertrophy. Left ventricular diastolic parameters were normal. 2. Right ventricular systolic function is normal. The right ventricular size is normal. Tricuspid regurgitation signal is inadequate for assessing PA pressure. 3. The mitral valve is normal in structure. Trivial mitral valve regurgitation. No evidence of mitral stenosis. 4. The morphology of the aortic valve was not visible for commentary. The maximum measured gradient through the aortic valve is 2.4 m/s with a mean gradient of 12 mmHg, which is suggestive of possible mild aortic stenosis. . Aortic valve regurgitation  is trivial. 5. The inferior vena cava is normal in size with greater than 50% respiratory variability, suggesting right atrial pressure of 3 mmHg.   Cardiac PET/CT  01/19/2024  The study has normal perfusion. The study is intermediate risk due to reduced MBFR. Findings may represent microvascular dysfunction, or may represent  multivessel CAD, though less likely in the absence of coronary artery calcifications.  LV perfusion is normal. There is no evidence of ischemia. There is no evidence of infarction.  Rest left ventricular function is normal. Rest EF: 57%. Stress left ventricular function is normal. Stress EF: 66%. End diastolic cavity size is normal. End systolic cavity size is normal. No evidence of transient ischemic dilation (TID) noted.  Myocardial blood flow was computed to be 1.30ml/g/min at rest and 1.46ml/g/min at stress. Global myocardial blood flow reserve was 1.29 and was abnormal.  Coronary calcium  was absent on the attenuation correction CT images. Mild aortic valve calcifications.  Electronically signed by: Soyla DELENA Merck, MD   Carotid ultrasound  01/06/2024 Right Carotid: Velocities in the right ICA are consistent with a 1-39% stenosis.  Left Carotid: Velocities in the left ICA are consistent with a 1-39% stenosis.  Vertebrals: Bilateral vertebral arteries demonstrate antegrade flow. Subclavians: Normal flow hemodynamics were seen in bilateral subclavian arteries.  Labs:    Latest Ref Rng & Units 01/25/2024   10:42 AM 10/21/2023   10:23 AM 05/10/2023    3:57 PM  CBC  WBC 4.0 - 10.5 K/uL 2.0  1.7  2.1   Hemoglobin 12.0 - 15.0 g/dL 88.6  89.8  89.2   Hematocrit 36.0 - 46.0 % 34.3  30.9  31.8   Platelets 150 - 400 K/uL 120  107  112        Latest Ref Rng & Units 01/25/2024   10:42 AM 12/07/2023    8:17 AM 10/26/2023    9:32 AM  BMP  Glucose 70 -  99 mg/dL 799  897  870   BUN 8 - 23 mg/dL 33  34  31   Creatinine 0.44 - 1.00 mg/dL 8.59  8.40  8.42   BUN/Creat Ratio 12 - 28  21    Sodium 135 - 145 mmol/L 137  137  139   Potassium 3.5 - 5.1 mmol/L 4.3  4.3  4.2   Chloride 98 - 111 mmol/L 104  99  102   CO2 22 - 32 mmol/L 26  22  28    Calcium  8.9 - 10.3 mg/dL 9.7  9.4  9.4       Latest Ref Rng & Units 01/25/2024   10:42 AM 12/07/2023    8:17 AM 10/26/2023    9:32 AM  CMP  Glucose  70 - 99 mg/dL 799  897  870   BUN 8 - 23 mg/dL 33  34  31   Creatinine 0.44 - 1.00 mg/dL 8.59  8.40  8.42   Sodium 135 - 145 mmol/L 137  137  139   Potassium 3.5 - 5.1 mmol/L 4.3  4.3  4.2   Chloride 98 - 111 mmol/L 104  99  102   CO2 22 - 32 mmol/L 26  22  28    Calcium  8.9 - 10.3 mg/dL 9.7  9.4  9.4   Total Protein 6.5 - 8.1 g/dL 7.8   7.5   Total Bilirubin 0.0 - 1.2 mg/dL 0.4   0.5   Alkaline Phos 38 - 126 U/L 138   131   AST 15 - 41 U/L 16   23   ALT 0 - 44 U/L 14   21     No results found for: CHOL, HDL, LDLCALC, LDLDIRECT, TRIG, CHOLHDL No results for input(s): LIPOA in the last 8760 hours. No components found for: NTPROBNP Recent Labs    12/07/23 0817  PROBNP 167   No results for input(s): TSH in the last 8760 hours.  External Labs: Collected: December 30, 2022 [facility] database. Total cholesterol 195, triglycerides 287, HDL 36, LDL 109  Collected: October 26, 2023, [facility] database. Creatinine 1.57. eGFR 33. BUN 31. INR 1.2. Potassium 4.2  Physical Exam:    Today's Vitals   01/26/24 0806  BP: 138/60  Pulse: 66  Resp: 16  SpO2: 97%  Weight: 232 lb (105.2 kg)  Height: 5' (1.524 m)   Body mass index is 45.31 kg/m. Wt Readings from Last 3 Encounters:  01/26/24 232 lb (105.2 kg)  12/06/23 227 lb 3.2 oz (103.1 kg)  10/26/23 248 lb 6 oz (112.7 kg)    Physical Exam  Constitutional: No distress. She appears chronically ill.  hemodynamically stable, presents in a wheelchair, ambulates with a cane  Neck: No JVD present.  Cardiovascular: Normal rate, regular rhythm, S1 normal and S2 normal. Exam reveals no gallop, no S3 and no S4.  No murmur heard. Pulses:      Carotid pulses are  on the right side with bruit and  on the left side with bruit. Pulmonary/Chest: Effort normal and breath sounds normal. No stridor. She has no wheezes. She has no rales.  Abdominal: Soft. Bowel sounds are normal. She exhibits no distension. There is no abdominal tenderness.   Abdominal obesity  Musculoskeletal:        General: No edema.     Cervical back: Neck supple.  Skin: Skin is warm.    Impression & Recommendation(s):  Impression:   ICD-10-CM   1. Dyspnea on exertion  R06.09  2. Atherosclerosis of both carotid arteries  I65.23 Comprehensive metabolic panel with GFR    Lipid panel    VAS US  CAROTID    Lipid panel    Comprehensive metabolic panel with GFR    3. Aortic atherosclerosis  I70.0 Comprehensive metabolic panel with GFR    Lipid panel    Lipid panel    Comprehensive metabolic panel with GFR    4. Statin intolerance  Z78.9     5. Preop cardiovascular exam  Z01.810     6. Benign hypertension  I10     7. OSA on CPAP  G47.33     8. Pancytopenia (HCC)  D61.818         Recommendation(s):  Dyspnea on exertion Shortness of breath for 3-4 months, exacerbated by walking. Differential includes cardiac causes, electrolyte imbalances, low hemoglobin, obesity hypoventilatory syndrome, and ?lung issues.  Echocardiogram: Preserved LVEF 55 to 60%, no regional wall motion abnormalities, possible mild aortic stenosis Cardiac PET/CT: Normal perfusion, reduced myocardial blood flow reserve differential diagnosis includes microvascular dysfunction, multivessel CAD, etc.  In the setting of none anginal chest pain and absence of coronary calcification multivessel CAD less likely. NT-proBNP within normal limits and not suggestive of heart failure. Recommend that she follows up with primary and pulmonary medicine for further evaluation of her shortness of breath  Aortic atherosclerosis (HCC) Bilateral carotid artery atherosclerosis Statin intolerance Patient states that she has tried a few statins in the past does not recall the names but was intolerant to them. Patient agreeable to starting Nexlizet at 180/10 mg p.o. daily medication profile discussed. Recommend following up on carotid duplex in 1 year to evaluate disease progression  Benign  hypertension Office blood pressures are well-controlled. Continue amlodipine  5 mg p.o. nightly. Continue Lasix  40 mg p.o. daily. Continue losartan  25 mg p.o. daily. Continue Lopressor  50 mg p.o. twice daily. Reemphasized importance of low-salt diet. Cardiology following peripherally, managed by primary care provider.  OSA on CPAP Reemphasize importance of being compliant with her CPAP.  Preop cardiovascular exam Echocardiogram shows normal cardiac function with LVEF 55-60%, mild LVH, and possible mild aortic stenosis. Stress test shows no significant blockages. Fluid markers normal, indicating no heart failure. Cardiac PET CT and echocardiogram reviewed with no concerns for colonoscopy. Myocardial blood flow abnormality noted but not concerning due to lack of angina symptoms and no coronary calcium . - Proceed with colonoscopy as cardiac function is adequate. - Will send a letter to GI.  Type 2 diabetes mellitus with obesity (HCC) Reemphasized importance of glycemic control. Currently on ARB, insulin , fenofibrate  Given the findings of aortic atherosclerosis, carotid artery atherosclerosis,, and diabetes recommended lipid-lowering agents.  Patient states that she has been intolerant to statin therapy in the past and therefore as part of shared decision making we will start Nexlizet with follow-up labs in 6 weeks.   Recommended goal LDL <70 mg/dL  Pancytopenia (HCC) Remains pancytopenic. Follows up with hematology. Non cardiac findings of the Cardiac PET/CT noted mild splenomegaly. I have asked her to discussed this further with hematologist / oncologist Dr. Federico. Patient verbalizes understanding. Based on Dr. Lafonda note from 10/2023 he is aware of the splenomegaly.   Orders Placed:  Orders Placed This Encounter  Procedures   Comprehensive metabolic panel with GFR    Standing Status:   Future    Number of Occurrences:   1    Expected Date:   03/08/2024    Expiration Date:    01/25/2025   Lipid panel  Standing Status:   Future    Number of Occurrences:   1    Expected Date:   03/08/2024    Expiration Date:   01/25/2025     Final Medication List:    Meds ordered this encounter  Medications   Bempedoic Acid-Ezetimibe  (NEXLIZET) 180-10 MG TABS    Sig: Take 1 tablet by mouth daily.    Dispense:  30 tablet    Refill:  11    Medications Discontinued During This Encounter  Medication Reason   doxazosin  (CARDURA ) 2 MG tablet Patient Preference     Current Outpatient Medications:    amLODipine  (NORVASC ) 5 MG tablet, Take 5 mg by mouth at bedtime., Disp: , Rfl:    BD INSULIN  SYRINGE U/F 31G X 5/16 0.5 ML MISC, USE 5 TIMES A DAY LEVEMIR  AND NOVOLOG , Disp: , Rfl:    BD PEN NEEDLE MICRO U/F 32G X 6 MM MISC, Inject 32 pens into the skin daily., Disp: , Rfl:    Bempedoic Acid-Ezetimibe  (NEXLIZET) 180-10 MG TABS, Take 1 tablet by mouth daily., Disp: 30 tablet, Rfl: 11   Cholecalciferol  (DIALYVITE VITAMIN D 5000) 125 MCG (5000 UT) capsule, Take 5,000 Units by mouth daily., Disp: , Rfl:    clonazePAM  (KLONOPIN ) 0.5 MG tablet, Take 0.5 mg by mouth See admin instructions. Take 0.5 mg at bedtime, may take an additional 0.5 mg tablet twice daily as needed for anxiety, Disp: , Rfl: 0   CREON  36000-114000 units CPEP capsule, Take 36,000 Units by mouth 3 (three) times daily with meals., Disp: , Rfl:    fenofibrate  54 MG tablet, Take 54 mg by mouth daily with supper., Disp: , Rfl: 2   FLUoxetine  (PROZAC ) 20 MG capsule, Take 40 mg by mouth at bedtime., Disp: , Rfl: 3   folic acid  (FOLVITE ) 1 MG tablet, Take 1 mg by mouth daily., Disp: , Rfl:    furosemide  (LASIX ) 40 MG tablet, Take 40 mg by mouth daily., Disp: , Rfl:    gabapentin  (NEURONTIN ) 300 MG capsule, Take 1 capsule (300 mg total) by mouth 3 (three) times daily for 30 days., Disp: 90 capsule, Rfl: 0   insulin  aspart (NOVOLOG ) 100 UNIT/ML FlexPen, Sliding scale dose: CBG < 70: implement hypoglycemia protocol  CBG 70 -  120: 0 units  CBG 121 - 150: 0 units  CBG 151 - 200: 0 units  CBG 201 - 250: 2 units  CBG 251 - 300: 3 units  CBG 301 - 350: 4 units  CBG 351 - 400: 5 units (Patient taking differently: Inject 20-25 Units into the skin 2 (two) times daily as needed for high blood sugar.), Disp: 15 mL, Rfl: 0   Insulin  Glargine (BASAGLAR  KWIKPEN) 100 UNIT/ML, Inject 22 Units into the skin 2 (two) times daily., Disp: , Rfl:    metoprolol  tartrate (LOPRESSOR ) 50 MG tablet, Take 50 mg by mouth 2 (two) times daily., Disp: , Rfl:    NON FORMULARY, Pt uses a c-pap nightly, Disp: , Rfl:    nutrition supplement, JUVEN, (JUVEN) PACK, Take 1 packet by mouth 2 (two) times daily between meals., Disp: , Rfl:    omeprazole (PRILOSEC) 20 MG capsule, Take 20 mg by mouth daily with supper. , Disp: , Rfl:    ondansetron  (ZOFRAN -ODT) 4 MG disintegrating tablet, Take 1 tablet (4 mg total) by mouth every 6 (six) hours as needed for nausea or vomiting., Disp: 30 tablet, Rfl: 0   oxybutynin  (DITROPAN -XL) 10 MG 24 hr tablet, Take 10 mg  by mouth daily., Disp: , Rfl:    propylthiouracil  (PTU) 50 MG tablet, Take 50 mg by mouth daily., Disp: , Rfl: 0   rOPINIRole  (REQUIP ) 2 MG tablet, Take 2-3 mg by mouth See admin instructions. Take 2 mg by mouth in the morning and take 3 mg in the evening, Disp: , Rfl:    tiZANidine  (ZANAFLEX ) 4 MG tablet, Take 1 tablet (4 mg total) by mouth at bedtime., Disp: 30 tablet, Rfl: 0   VENTOLIN  HFA 108 (90 Base) MCG/ACT inhaler, Inhale 2 puffs into the lungs every 4 (four) hours as needed for shortness of breath., Disp: , Rfl:    losartan  (COZAAR ) 25 MG tablet, Take 25 mg by mouth., Disp: , Rfl:   Consent:   N/A  Disposition:   1 year follow-up sooner if needed  Her questions and concerns were addressed to her satisfaction. She voices understanding of the recommendations provided during this encounter.    Signed, Madonna Michele HAS, [facility]  A Division of [facility] [address], [place], [place] [address]

## 2024-01-26 NOTE — Patient Instructions (Signed)
 Medication Instructions:  Your physician has recommended you make the following change in your medication:   ** Begin Nexlizet 180-10mg  - 1 tablet by mouth daily.  *If you need a refill on your cardiac medications before your next appointment, please call your pharmacy*  Lab Work: CMET and Fasting Lipids in 6 weeks If you have labs (blood work) drawn today and your tests are completely normal, you will receive your results only by: MyChart Message (if you have MyChart) OR A paper copy in the mail If you have any lab test that is abnormal or we need to change your treatment, we will call you to review the results.  Testing/Procedures: Carotid duplex October 2026   Follow-Up: At [facility], you and your health needs are our priority.  As part of our continuing mission to provide you with exceptional heart care, our providers are all part of one team.  This team includes your primary Cardiologist (physician) and Advanced Practice Providers or APPs (Physician Assistants and Nurse Practitioners) who all work together to provide you with the care you need, when you need it.  Your next appointment:   November 2026 with Dr Michele   We recommend signing up for the patient portal called MyChart.  Sign up information is provided on this After Visit Summary.  MyChart is used to connect with patients for Virtual Visits (Telemedicine).  Patients are able to view lab/test results, encounter notes, upcoming appointments, etc.  Non-urgent messages can be sent to your provider as well.   To learn more about what you can do with MyChart, go to [email or web address].   Other Instructions Please follow up with PCP for shortness of breath and Lasix 

## 2024-01-27 ENCOUNTER — Ambulatory Visit: Admitting: Gastroenterology

## 2024-01-29 ENCOUNTER — Encounter: Payer: Self-pay | Admitting: Cardiology

## 2024-02-20 ENCOUNTER — Ambulatory Visit: Admitting: Gastroenterology

## 2024-02-27 DIAGNOSIS — M67441 Ganglion, right hand: Secondary | ICD-10-CM | POA: Diagnosis not present

## 2024-02-27 DIAGNOSIS — M19041 Primary osteoarthritis, right hand: Secondary | ICD-10-CM | POA: Diagnosis not present

## 2024-02-27 DIAGNOSIS — M65331 Trigger finger, right middle finger: Secondary | ICD-10-CM | POA: Diagnosis not present

## 2024-02-27 DIAGNOSIS — M19042 Primary osteoarthritis, left hand: Secondary | ICD-10-CM | POA: Diagnosis not present

## 2024-04-02 ENCOUNTER — Ambulatory Visit: Admitting: Gastroenterology

## 2024-04-04 LAB — LAB REPORT - SCANNED
A1c: 6.7
EGFR: 44.3

## 2024-04-05 ENCOUNTER — Ambulatory Visit: Payer: Self-pay | Admitting: Cardiology

## 2024-04-23 ENCOUNTER — Encounter (HOSPITAL_BASED_OUTPATIENT_CLINIC_OR_DEPARTMENT_OTHER): Payer: Self-pay

## 2024-04-23 ENCOUNTER — Emergency Department (HOSPITAL_BASED_OUTPATIENT_CLINIC_OR_DEPARTMENT_OTHER)

## 2024-04-23 ENCOUNTER — Other Ambulatory Visit: Payer: Self-pay

## 2024-04-23 ENCOUNTER — Emergency Department (HOSPITAL_BASED_OUTPATIENT_CLINIC_OR_DEPARTMENT_OTHER)
Admission: EM | Admit: 2024-04-23 | Discharge: 2024-04-23 | Disposition: A | Discharge status: Home / Self Care | Source: Ambulatory Visit

## 2024-04-23 DIAGNOSIS — Z7984 Long term (current) use of oral hypoglycemic drugs: Secondary | ICD-10-CM | POA: Insufficient documentation

## 2024-04-23 DIAGNOSIS — Z794 Long term (current) use of insulin: Secondary | ICD-10-CM | POA: Insufficient documentation

## 2024-04-23 DIAGNOSIS — R0602 Shortness of breath: Secondary | ICD-10-CM | POA: Insufficient documentation

## 2024-04-23 DIAGNOSIS — I10 Essential (primary) hypertension: Secondary | ICD-10-CM | POA: Insufficient documentation

## 2024-04-23 DIAGNOSIS — Z79899 Other long term (current) drug therapy: Secondary | ICD-10-CM | POA: Insufficient documentation

## 2024-04-23 DIAGNOSIS — E119 Type 2 diabetes mellitus without complications: Secondary | ICD-10-CM | POA: Insufficient documentation

## 2024-04-23 LAB — TROPONIN T, HIGH SENSITIVITY
Troponin T High Sensitivity: 18 ng/L (ref 0–19)
Troponin T High Sensitivity: 17 ng/L (ref 0–19)

## 2024-04-23 LAB — CBC
HCT: 31.3 % — ABNORMAL LOW (ref 36.0–46.0)
Hemoglobin: 10.5 g/dL — ABNORMAL LOW (ref 12.0–15.0)
MCH: 29.2 pg (ref 26.0–34.0)
MCHC: 33.5 g/dL (ref 30.0–36.0)
MCV: 86.9 fL (ref 80.0–100.0)
Platelets: 126 10*3/uL — ABNORMAL LOW (ref 150–400)
RBC: 3.6 MIL/uL — ABNORMAL LOW (ref 3.87–5.11)
RDW: 14.9 % (ref 11.5–15.5)
WBC: 2.2 10*3/uL — ABNORMAL LOW (ref 4.0–10.5)
nRBC: 0 % (ref 0.0–0.2)

## 2024-04-23 LAB — RESP PANEL BY RT-PCR (RSV, FLU A&B, COVID)  RVPGX2
Influenza A by PCR: NEGATIVE
Influenza B by PCR: NEGATIVE
Resp Syncytial Virus by PCR: NEGATIVE
SARS Coronavirus 2 by RT PCR: NEGATIVE

## 2024-04-23 LAB — BASIC METABOLIC PANEL WITH GFR
Anion gap: 12 (ref 5–15)
BUN: 34 mg/dL — ABNORMAL HIGH (ref 8–23)
CO2: 26 mmol/L (ref 22–32)
Calcium: 10 mg/dL (ref 8.9–10.3)
Chloride: 102 mmol/L (ref 98–111)
Creatinine, Ser: 1.3 mg/dL — ABNORMAL HIGH (ref 0.44–1.00)
GFR, Estimated: 44 mL/min — ABNORMAL LOW
Glucose, Bld: 274 mg/dL — ABNORMAL HIGH (ref 70–99)
Potassium: 4.5 mmol/L (ref 3.5–5.1)
Sodium: 140 mmol/L (ref 135–145)

## 2024-04-23 LAB — PRO BRAIN NATRIURETIC PEPTIDE: Pro Brain Natriuretic Peptide: 807 pg/mL — ABNORMAL HIGH

## 2024-04-23 MED ORDER — IOHEXOL 350 MG/ML SOLN
75.0000 mL | Freq: Once | INTRAVENOUS | Status: AC | PRN
  Administered 2024-04-23: 75 mL via INTRAVENOUS

## 2024-04-23 NOTE — Discharge Instructions (Addendum)
 Please follow up with your cardiologist to discuss your pulmonary hypertension.   Please keep a close eye on your diabetes. Your glucose is slightly elevated today.   If you have worsening shortness of breath and/or chest pain then please come back to the ED immediately.

## 2024-04-23 NOTE — ED Notes (Signed)
 Patient transported to CT

## 2024-04-23 NOTE — ED Provider Notes (Signed)
 " [place] EMERGENCY DEPARTMENT AT [facility] Provider Note   CSN: [id number] Arrival date & time: 04/23/24  1357     Patient presents with: Shortness of Breath   [name] is a 74 y.o. female.    Shortness of Breath    74 year old female with medical history significant for obesity, fibromyalgia, diabetes mellitus, HLD, HTN, OSA presenting to the emergency department with a chief complaint of dyspnea.  The patient states that she has had dyspnea on exertion for the last couple of days which has been worsening.  She has had difficulty ambulating in the setting of dyspnea.  Her O2 saturations dropped to 87% while she was making a bed at home.  She states that she had a positive D-dimer outpatient.  Denies any chest pain, denies any cough fever or chills.  No history of DVT or PE.  No lower extremity swelling.  No known history of pulmonary hypertension.  Her last echocardiogram was in October 2025 which showed a normal LVEF, possible mild aortic stenosis.  Prior to Admission medications  Medication Sig Start Date End Date Taking? Authorizing Provider  amLODipine  (NORVASC ) 5 MG tablet Take 5 mg by mouth at bedtime.    [provider]  BD INSULIN  SYRINGE U/F 31G X 5/16 0.5 ML MISC USE 5 TIMES A DAY LEVEMIR  AND NOVOLOG  12/13/18   [provider]  BD PEN NEEDLE MICRO U/F 32G X 6 MM MISC Inject 32 pens into the skin daily. 06/18/19   [provider]  Bempedoic Acid-Ezetimibe  (NEXLIZET ) 180-10 MG TABS Take 1 tablet by mouth daily. 01/26/24   Tolia, Sunit, DO  Cholecalciferol  (DIALYVITE VITAMIN D 5000) 125 MCG (5000 UT) capsule Take 5,000 Units by mouth daily.    [provider]  clonazePAM  (KLONOPIN ) 0.5 MG tablet Take 0.5 mg by mouth See admin instructions. Take 0.5 mg at bedtime, may take an additional 0.5 mg tablet twice daily as needed for anxiety 07/01/14   [provider]  CREON  36000-114000 units CPEP capsule Take 36,000 Units by  mouth 3 (three) times daily with meals. 05/11/22   [provider]  fenofibrate  54 MG tablet Take 54 mg by mouth daily with supper. 06/20/17   [provider]  FLUoxetine  (PROZAC ) 20 MG capsule Take 40 mg by mouth at bedtime. 06/28/17   [provider]  folic acid  (FOLVITE ) 1 MG tablet Take 1 mg by mouth daily.    [provider]  furosemide  (LASIX ) 40 MG tablet Take 40 mg by mouth daily. 06/17/22   [provider]  gabapentin  (NEURONTIN ) 300 MG capsule Take 1 capsule (300 mg total) by mouth 3 (three) times daily for 30 days. 07/31/18 01/26/24  Cindy Garnette POUR, MD  insulin  aspart (NOVOLOG ) 100 UNIT/ML FlexPen Sliding scale dose: CBG < 70: implement hypoglycemia protocol  CBG 70 - 120: 0 units  CBG 121 - 150: 0 units  CBG 151 - 200: 0 units  CBG 201 - 250: 2 units  CBG 251 - 300: 3 units  CBG 301 - 350: 4 units  CBG 351 - 400: 5 units Patient taking differently: Inject 20-25 Units into the skin 2 (two) times daily as needed for high blood sugar. 07/31/18   Cindy Garnette POUR, MD  Insulin  Glargine (BASAGLAR  KWIKPEN) 100 UNIT/ML Inject 22 Units into the skin 2 (two) times daily. 05/11/22   [provider]  losartan  (COZAAR ) 25 MG tablet Take 25 mg by mouth. 08/09/23   [provider]  metoprolol  tartrate (LOPRESSOR ) 50 MG tablet Take 50 mg by mouth 2 (two) times daily. 12/04/18   [provider]  NON FORMULARY Pt uses a c-pap nightly    [provider]  nutrition supplement, JUVEN, (JUVEN) PACK Take 1 packet by mouth 2 (two) times daily between meals.    [provider]  omeprazole (PRILOSEC) 20 MG capsule Take 20 mg by mouth daily with supper.     [provider]  ondansetron  (ZOFRAN -ODT) 4 MG disintegrating tablet Take 1 tablet (4 mg total) by mouth every 6 (six) hours as needed for nausea or vomiting. 03/03/23   Lenis Barter, PA-C  oxybutynin  (DITROPAN -XL) 10 MG 24 hr tablet Take 10 mg by mouth daily. 06/18/19    [provider]  propylthiouracil  (PTU) 50 MG tablet Take 50 mg by mouth daily. 12/22/16   [provider]  rOPINIRole  (REQUIP ) 2 MG tablet Take 2-3 mg by mouth See admin instructions. Take 2 mg by mouth in the morning and take 3 mg in the evening    [provider]  tiZANidine  (ZANAFLEX ) 4 MG tablet Take 1 tablet (4 mg total) by mouth at bedtime. 03/03/23   Lenis Barter, PA-C  VENTOLIN  HFA 108 (90 Base) MCG/ACT inhaler Inhale 2 puffs into the lungs every 4 (four) hours as needed for shortness of breath. 11/11/21   [provider]    Allergies: Morphine , Promethazine hcl, Sumatriptan, Codeine, Hydrocodone -acetaminophen , Misc. sulfonamide containing compounds, Nsaids, Statins, Sulfonamide derivatives, Methimazole, and Niacin    Review of Systems  Respiratory:  Positive for shortness of breath.   All other systems reviewed and are negative.   Updated Vital Signs BP (!) 152/55 (BP Location: Left Wrist)   Pulse 62   Temp 98 F (36.7 C) (Oral)   Resp 20   Ht 5' (1.524 m)   Wt 104.3 kg   SpO2 97%   BMI 44.92 kg/m   Physical Exam Vitals and nursing note reviewed.  Constitutional:      General: She is not in acute distress.    Appearance: She is well-developed. She is obese.  HENT:     Head: Normocephalic and atraumatic.  Eyes:     Conjunctiva/sclera: Conjunctivae normal.  Cardiovascular:     Rate and Rhythm: Normal rate and regular rhythm.     Heart sounds: No murmur heard. Pulmonary:     Effort: Pulmonary effort is normal. No respiratory distress.     Breath sounds: Normal breath sounds.  Abdominal:     Palpations: Abdomen is soft.     Tenderness: There is no abdominal tenderness.  Musculoskeletal:        General: No swelling.     Cervical back: Neck supple.     Right lower leg: No edema.     Left lower leg: No edema.  Skin:    General: Skin is warm and dry.     Capillary Refill: Capillary refill takes less than 2 seconds.  Neurological:      Mental Status: She is alert.  Psychiatric:        Mood and Affect: Mood normal.     (all labs ordered are listed, but only abnormal results are displayed) Labs Reviewed  BASIC METABOLIC PANEL WITH GFR  CBC    EKG: None  Radiology: No results found.   Procedures   Medications Ordered in the ED - No data to display  Medical Decision Making Amount and/or Complexity of Data Reviewed Labs: ordered. Radiology: ordered.    74 year old female with medical history significant for obesity, fibromyalgia, diabetes mellitus, HLD, HTN, OSA presenting to the emergency department with a chief complaint of dyspnea.  The patient states that she has had dyspnea on exertion for the last couple of days which has been worsening.  She has had difficulty ambulating in the setting of dyspnea.  Her O2 saturations dropped to 87% while she was making a bed at home.  She states that she had a positive D-dimer outpatient.  Denies any chest pain, denies any cough fever or chills.  No history of DVT or PE.  No lower extremity swelling.  No known history of pulmonary hypertension.  Her last echocardiogram was in October 2025 which showed a normal LVEF, possible mild aortic stenosis.  On arrival, the patient was afebrile, not tachycardic or tachypneic, BP 152/55, saturating 97% on room air.  On exam the patient was resting comfortably in bed in no respiratory distress, clear lungs to auscultation bilaterally, no lower extremity edema noted.  Differential diagnose includes PE, pneumothorax, pneumonia, viral infectious etiology, pericardial effusion.  Concern for ACS.  Considered obesity hypoventilation syndrome.  Tendered critical anemia.  Laboratory evaluation: CBC 2.2, hemoglobin low at 10.5.  The MP with hyperglycemia to 274, serum creatinine 1.3, no other significant electrolyte abnormality.  COVID flu and RSV PCR testing pending, cardiac troponin and BNP pending.  Chest  x-ray: IMPRESSION:  Stable cardiomegaly with mild left basilar linear scarring and/or  atelectasis.    CTA PE study: Pending at time of signout  Signout given to Dr. Jackquline to follow-up results of CT imaging and remainder of laboratory evaluation, reassess the patient, ultimate disposition pending results of diagnostic testing and reassessment.     Final diagnoses:  None    ED Discharge Orders     None          Jerrol Agent, MD 04/23/24 1518  "

## 2024-04-23 NOTE — ED Triage Notes (Signed)
 Pt POV d/t SOB and also d/t elevated D-Dimer.  Pt states her O2 dropped to 87% while making a bed.

## 2024-04-23 NOTE — ED Provider Notes (Signed)
" °  Physical Exam  BP (!) 152/55 (BP Location: Left Wrist)   Pulse 62   Temp 98 F (36.7 C) (Oral)   Resp 20   Ht 5' (1.524 m)   Wt 104.3 kg   SpO2 97%   BMI 44.92 kg/m   Physical Exam Vitals and nursing note reviewed.  Constitutional:      General: She is not in acute distress.    Appearance: She is well-developed.  HENT:     Head: Normocephalic and atraumatic.  Eyes:     Conjunctiva/sclera: Conjunctivae normal.  Cardiovascular:     Rate and Rhythm: Normal rate and regular rhythm.     Heart sounds: No murmur heard. Pulmonary:     Effort: Pulmonary effort is normal. No respiratory distress.     Breath sounds: Normal breath sounds.  Abdominal:     Palpations: Abdomen is soft.     Tenderness: There is no abdominal tenderness.  Musculoskeletal:        General: No swelling.     Cervical back: Neck supple.  Skin:    General: Skin is warm and dry.     Capillary Refill: Capillary refill takes less than 2 seconds.  Neurological:     Mental Status: She is alert.  Psychiatric:        Mood and Affect: Mood normal.     Procedures  Procedures  ED Course / MDM    Medical Decision Making Amount and/or Complexity of Data Reviewed Labs: ordered. Radiology: ordered.  Risk Prescription drug management.   Patient present because of shortness of breath.  Dyspnea.  D-dimer positive outpatient.  CTPA study pending.  Added on troponin and BNP as well.  Vital signs stable at this time.   Vital signs remained stable here.  Hemodynamically stable.  No hypoxia.  Leukopenia baseline.  Troponin showed no delta change.  CTPA showed no PE.  Did show pulmonary hypertension that she can follow-up with cardiology for.  Otherwise, patient discharged in stable condition.    Simon Lavonia SAILOR, MD 04/23/24 2226  "

## 2024-04-24 ENCOUNTER — Other Ambulatory Visit: Payer: Self-pay | Admitting: Hematology and Oncology

## 2024-04-24 DIAGNOSIS — D500 Iron deficiency anemia secondary to blood loss (chronic): Secondary | ICD-10-CM

## 2024-04-25 ENCOUNTER — Inpatient Hospital Stay: Admitting: Hematology and Oncology

## 2024-04-25 ENCOUNTER — Inpatient Hospital Stay: Attending: Hematology and Oncology

## 2024-04-25 ENCOUNTER — Encounter: Payer: Self-pay | Admitting: Emergency Medicine

## 2024-04-25 ENCOUNTER — Ambulatory Visit: Admitting: Emergency Medicine

## 2024-04-25 VITALS — BP 143/61 | HR 55 | Temp 98.0°F | Resp 18 | Wt 229.5 lb

## 2024-04-25 VITALS — BP 148/82 | HR 56 | Ht 60.0 in | Wt 229.0 lb

## 2024-04-25 DIAGNOSIS — I6523 Occlusion and stenosis of bilateral carotid arteries: Secondary | ICD-10-CM

## 2024-04-25 DIAGNOSIS — R161 Splenomegaly, not elsewhere classified: Secondary | ICD-10-CM

## 2024-04-25 DIAGNOSIS — I272 Pulmonary hypertension, unspecified: Secondary | ICD-10-CM

## 2024-04-25 DIAGNOSIS — I10 Essential (primary) hypertension: Secondary | ICD-10-CM

## 2024-04-25 DIAGNOSIS — D61818 Other pancytopenia: Secondary | ICD-10-CM

## 2024-04-25 DIAGNOSIS — R0602 Shortness of breath: Secondary | ICD-10-CM | POA: Diagnosis not present

## 2024-04-25 DIAGNOSIS — G4733 Obstructive sleep apnea (adult) (pediatric): Secondary | ICD-10-CM | POA: Diagnosis not present

## 2024-04-25 DIAGNOSIS — I350 Nonrheumatic aortic (valve) stenosis: Secondary | ICD-10-CM | POA: Diagnosis not present

## 2024-04-25 DIAGNOSIS — D500 Iron deficiency anemia secondary to blood loss (chronic): Secondary | ICD-10-CM

## 2024-04-25 LAB — CMP (CANCER CENTER ONLY)
ALT: 19 U/L (ref 0–44)
AST: 25 U/L (ref 15–41)
Albumin: 4.2 g/dL (ref 3.5–5.0)
Alkaline Phosphatase: 117 U/L (ref 38–126)
Anion gap: 11 (ref 5–15)
BUN: 35 mg/dL — ABNORMAL HIGH (ref 8–23)
CO2: 27 mmol/L (ref 22–32)
Calcium: 9.5 mg/dL (ref 8.9–10.3)
Chloride: 103 mmol/L (ref 98–111)
Creatinine: 1.54 mg/dL — ABNORMAL HIGH (ref 0.44–1.00)
GFR, Estimated: 36 mL/min — ABNORMAL LOW
Glucose, Bld: 134 mg/dL — ABNORMAL HIGH (ref 70–99)
Potassium: 4.6 mmol/L (ref 3.5–5.1)
Sodium: 141 mmol/L (ref 135–145)
Total Bilirubin: 0.4 mg/dL (ref 0.0–1.2)
Total Protein: 7.2 g/dL (ref 6.5–8.1)

## 2024-04-25 LAB — CBC WITH DIFFERENTIAL (CANCER CENTER ONLY)
Abs Immature Granulocytes: 0.01 10*3/uL (ref 0.00–0.07)
Basophils Absolute: 0 10*3/uL (ref 0.0–0.1)
Basophils Relative: 0 %
Eosinophils Absolute: 0 10*3/uL (ref 0.0–0.5)
Eosinophils Relative: 0 %
HCT: 30.6 % — ABNORMAL LOW (ref 36.0–46.0)
Hemoglobin: 10.2 g/dL — ABNORMAL LOW (ref 12.0–15.0)
Immature Granulocytes: 1 %
Lymphocytes Relative: 32 %
Lymphs Abs: 0.6 10*3/uL — ABNORMAL LOW (ref 0.7–4.0)
MCH: 28.7 pg (ref 26.0–34.0)
MCHC: 33.3 g/dL (ref 30.0–36.0)
MCV: 86 fL (ref 80.0–100.0)
Monocytes Absolute: 0.2 10*3/uL (ref 0.1–1.0)
Monocytes Relative: 8 %
Neutro Abs: 1.2 10*3/uL — ABNORMAL LOW (ref 1.7–7.7)
Neutrophils Relative %: 59 %
Platelet Count: 120 10*3/uL — ABNORMAL LOW (ref 150–400)
RBC: 3.56 MIL/uL — ABNORMAL LOW (ref 3.87–5.11)
RDW: 14.8 % (ref 11.5–15.5)
WBC Count: 2 10*3/uL — ABNORMAL LOW (ref 4.0–10.5)
nRBC: 0 % (ref 0.0–0.2)

## 2024-04-25 LAB — FERRITIN: Ferritin: 74 ng/mL (ref 11–307)

## 2024-04-25 LAB — IRON AND IRON BINDING CAPACITY (CC-WL,HP ONLY)
Iron: 68 ug/dL (ref 28–170)
Saturation Ratios: 19 % (ref 10.4–31.8)
TIBC: 347 ug/dL (ref 250–450)
UIBC: 280 ug/dL

## 2024-04-25 LAB — RETIC PANEL
Immature Retic Fract: 23.8 % — ABNORMAL HIGH (ref 2.3–15.9)
RBC.: 3.51 MIL/uL — ABNORMAL LOW (ref 3.87–5.11)
Retic Count, Absolute: 95.5 10*3/uL (ref 19.0–186.0)
Retic Ct Pct: 2.7 % (ref 0.4–3.1)
Reticulocyte Hemoglobin: 31.1 pg

## 2024-04-25 NOTE — Patient Instructions (Addendum)
 Medication Instructions:  Take Furosemide  80MG  for the next 3 days.  A referral has been placed for Pulmonology for you today.   *If you need a refill on your cardiac medications before your next appointment, please call your pharmacy*  Lab Work: No labs were ordered during today's visit.  If you have labs (blood work) drawn today and your tests are completely normal, you will receive your results only by: MyChart Message (if you have MyChart) OR A paper copy in the mail If you have any lab test that is abnormal or we need to change your treatment, we will call you to review the results.  Testing/Procedures: Your physician has requested that you have an echocardiogram. Echocardiography is a painless test that uses sound waves to create images of your heart. It provides your doctor with information about the size and shape of your heart and how well your hearts chambers and valves are working. This procedure takes approximately one hour. There are no restrictions for this procedure.   Please do NOT wear cologne, perfume, aftershave, or lotions (deodorant is allowed).   Please arrive 15 minutes prior to your appointment time.   Follow-Up: At [facility], you and your health needs are our priority.  As part of our continuing mission to provide you with exceptional heart care, our providers are all part of one team.  This team includes your primary Cardiologist (physician) and Advanced Practice Providers or APPs (Physician Assistants and Nurse Practitioners) who all work together to provide you with the care you need, when you need it.  Your next appointment:   1 month(s)  Provider:   Lum Louis, NP          We recommend signing up for the patient portal called MyChart.  Sign up information is provided on this After Visit Summary.  MyChart is used to connect with patients for Virtual Visits (Telemedicine).  Patients are able to view lab/test results, encounter notes,  upcoming appointments, etc.  Non-urgent messages can be sent to your provider as well.   To learn more about what you can do with MyChart, go to [email or web address].   Other Instructions

## 2024-04-25 NOTE — Progress Notes (Signed)
 " Cardiology Office Note:    Date:  04/25/2024  ID:  [name], DOB 20-Jan-1951, MRN [id number] PCP: Shayne Anes, MD  [facility] Providers Cardiologist:  Madonna Large, DO       Patient Profile:       Chief Complaint: Acute visit for shortness of breath History of Present Illness:  [name] is a 74 y.o. female with visit-pertinent history of carotid artery atherosclerosis, aortic atherosclerosis, diabetes, fibromyalgia, iron  deficiency anemia, gastric sleeve, CKD, NASH, CHF, obesity, obstructive sleep apnea  In August 2025 patient was evaluated in regards to undergoing a repeat colonoscopy for surveillance given her history of adenoma.  At that time she was experiencing lower extremity edema, shortness of breath, pancytopenia and they recommended cardiovascular evaluation prior to scheduling colonoscopy.  She is established with Dr. Large on 12/06/2023.  She was noted to have shortness of breath for 3 to 4 months exacerbated by walking.  She underwent echocardiogram on 01/06/2024 showing LVEF 55 to 60%, no RWMA, mild LVH, normal diastolic parameters, RV function and size normal, trivial MR, possible mild aortic stenosis.  Carotid duplex 01/06/2024 showed bilateral ICAs consistent with 1-39% stenosis.  She underwent cardiac PET stress test on 01/18/2024 with the study having normal perfusion but indeterminate risk due to reduced MBFR.  There is no evidence of ischemia or infarction.  Coronary calcium  was absent on the attenuation correction CT images.  Patient was last seen in clinic on 01/26/2024.  It was recommended she follow-up primary pulmonary medicine for further evaluation of her shortness of breath.  She has prior history of statin intolerance and was started on Nexlizet .  Her office blood pressures were well-controlled.  She was given the okay to proceed with colonoscopy.  She was to follow-up in 1 year.  Patient was seen in the emergency department on 04/23/2024 for  evaluation of shortness of breath and elevated D-dimer that was positive outpatient by PCP.  Patient underwent CTPA showing no PE but did show pulmonary hypertension.  Troponin was unremarkable.  proBNP was elevated at 807.   Discussed the use of AI scribe software for clinical note transcription with the patient, who gave verbal consent to proceed.  History of Present Illness [name] is a 74 year old female with pulmonary hypertension who presents with worsening shortness of breath.   She reports progressive exertional shortness of breath since last summer, now causing marked limitation. With light activities such as making the bed, her oxygen  saturation drops to about 87%. During physical therapy leg exercises her saturation falls below 90%. She denies orthopnea or PND.  She denies any significant LEE or weight gain.  She feels best in the mornings.  She was recently told she has pulmonary hypertension after an emergency department visit where imaging showed no lung pathology but suggested pulmonary hypertension. She uses CPAP regularly for sleep apnea.   She has had no symptomatic relief with nebulizer, Trelegy, albuterol , steroids, or antibiotics previously given for presumed bronchial disease.  Prior workup includes a normal stress test and echocardiogram.  She has mild aortic stenosis on echocardiogram associated with a known heart murmur.  She takes furosemide  40 mg daily, amlodipine , clonazepam , fenofibrate , insulin , losartan , and metoprolol .    Review of systems:  Please see the history of present illness. All other systems are reviewed and otherwise negative.      Studies Reviewed:        Cardiac PET stress test 01/18/2024   The study has  normal perfusion. The study is intermediate risk due to reduced MBFR. Findings may represent microvascular dysfunction, or may represent multivessel CAD, though less likely in the absence of coronary artery calcifications.   LV  perfusion is normal. There is no evidence of ischemia. There is no evidence of infarction.   Rest left ventricular function is normal. Rest EF: 57%. Stress left ventricular function is normal. Stress EF: 66%. End diastolic cavity size is normal. End systolic cavity size is normal. No evidence of transient ischemic dilation (TID) noted.   Myocardial blood flow was computed to be 1.3ml/g/min at rest and 1.46ml/g/min at stress. Global myocardial blood flow reserve was 1.29 and was abnormal.   Coronary calcium  was absent on the attenuation correction CT images. Mild aortic valve calcifications.  Carotid duplex 01/06/2024 Right Carotid: Velocities in the right ICA are consistent with a 1-39%  stenosis.   Left Carotid: Velocities in the left ICA are consistent with a 1-39%  stenosis.   Vertebrals: Bilateral vertebral arteries demonstrate antegrade flow.  Subclavians: Normal flow hemodynamics were seen in bilateral subclavian               arteries.  Echocardiogram 01/06/2024  1. This study had very poor sound transmission and there was no contrast  used, which greatly limits the interpretation of this exam. Left  ventricular ejection fraction, by estimation, is 55 to 60%. Left  ventricular ejection fraction by 3D volume is 59  %. The left ventricle has normal function. The left ventricle has no  regional wall motion abnormalities. There is mild concentric left  ventricular hypertrophy. Left ventricular diastolic parameters were  normal.   2. Right ventricular systolic function is normal. The right ventricular  size is normal. Tricuspid regurgitation signal is inadequate for assessing  PA pressure.   3. The mitral valve is normal in structure. Trivial mitral valve  regurgitation. No evidence of mitral stenosis.   4. The morphology of the aortic valve was not visible for commentary. The  maximum measured gradient through the aortic valve is 2.4 m/s with a mean  gradient of 12 mmHg, which is  suggestive of possible mild aortic stenosis.  . Aortic valve regurgitation  is trivial.   5. The inferior vena cava is normal in size with greater than 50%  respiratory variability, suggesting right atrial pressure of 3 mmHg.  Risk Assessment/Calculations:             Physical Exam:   VS:  BP (!) 148/82   Pulse (!) 56   Ht 5' (1.524 m)   Wt 229 lb (103.9 kg)   SpO2 96%   BMI 44.72 kg/m    Wt Readings from Last 3 Encounters:  04/25/24 229 lb (103.9 kg)  04/25/24 229 lb 8 oz (104.1 kg)  04/23/24 230 lb (104.3 kg)    GEN: Well nourished, well developed in no acute distress NECK: No JVD; No carotid bruits CARDIAC: RRR, no murmurs, rubs, gallops RESPIRATORY:  Clear to auscultation without rales, wheezing or rhonchi  ABDOMEN: Soft, non-tender, non-distended EXTREMITIES:  No edema; No acute deformity      Assessment and Plan:  Dyspnea on exertion Pulmonary hypertension CT cardiac scoring 10/2020 with CAC of 0 Cardiac PET/CT showed normal perfusion, indeterminate risk due to reduced MBFR, absent coronary calcium , no evidence of ischemia or infarction Echocardiogram 12/2023 showed LVEF 55 to 60%, no RWMA, normal RV, mild AS CTA 04/2024 showed no PE but evidence of pulmonary artery hypertension - Patient reports progressive exertional  shortness of breath for the past 6 months without associated chest pains - Recent echocardiogram, stress test, and prior CT cardiac scoring were all reassuring.  Obesity hypoventilation syndrome, uncontrolled OSA and questionable lung issues likely contributing to [facility] as seen on recent CT.  She does wear CPAP daily - She had elevated proBNP of 807 on 04/2024 indicating possible heart failure and fluid retention.  Recent CT showed no pleural effusions - Today I will increase her furosemide  from 40 to 80 mg x 3 days then resume 40 mg daily.  Difficult to diurese given current kidney function with a creatinine of 1.54 - Plan to refer to pulmonology for further  evaluation of her dyspnea - I will have her follow-up in 1 month to reassess her symptoms and if shortness of breath does worsen with stable repeat echo we will discuss possible RHC with patient's primary cardiologist - Plan for echocardiogram to reevaluate her LV function and AS with [facility]   Mild aortic stenosis Echocardiogram 12/2023 showed possible mild aortic stenosis which was a technically difficult study - No further interventions warranted at this time - She is without exertional chest discomfort - Will reevaluate with echocardiogram with [facility]  as noted above   Hypertension Blood pressure today is elevated but has been well-controlled in the past - She will monitor at home over the next week and send results over MyChart if blood pressure stays >140/90 - Continue amlodipine  5 mg daily, losartan  25 mg daily, metoprolol  tartrate 50 mg twice daily, furosemide  40 mg daily  Obstructive sleep apnea - Reports adherence to CPAP therapy  Carotid artery disease Carotid duplex 12/2023 showed bilateral ICA 1-39% stenosis - History of statin intolerance - Continue Nexlizet  180-10 mg daily - Plan for carotid duplex x 1 year to evaluate for disease progression  Hyperlipidemia, LDL goal <70 LDL 109 on 12/2022 - Started on Nexlizet  on 01/2024 - History of statin intolerance - Plan for repeat fasting lipid panel at follow-up visit  T2DM A1c 6.7% on 03/2024 - Managed on insulin       Dispo:  Return in about 1 month (around 05/23/2024).  Signed, Lum LITTIE Louis, NP  "

## 2024-04-26 ENCOUNTER — Encounter: Payer: Self-pay | Admitting: Physician Assistant

## 2024-04-26 ENCOUNTER — Ambulatory Visit: Admitting: Gastroenterology

## 2024-04-26 ENCOUNTER — Other Ambulatory Visit

## 2024-04-26 ENCOUNTER — Ambulatory Visit: Payer: Self-pay | Admitting: *Deleted

## 2024-04-26 ENCOUNTER — Encounter: Payer: Self-pay | Admitting: Gastroenterology

## 2024-04-26 VITALS — BP 122/74 | HR 65 | Ht 60.0 in | Wt 229.0 lb

## 2024-04-26 DIAGNOSIS — K862 Cyst of pancreas: Secondary | ICD-10-CM | POA: Diagnosis not present

## 2024-04-26 DIAGNOSIS — R152 Fecal urgency: Secondary | ICD-10-CM | POA: Diagnosis not present

## 2024-04-26 DIAGNOSIS — Z860100 Personal history of colon polyps, unspecified: Secondary | ICD-10-CM

## 2024-04-26 DIAGNOSIS — A09 Infectious gastroenteritis and colitis, unspecified: Secondary | ICD-10-CM | POA: Diagnosis not present

## 2024-04-26 DIAGNOSIS — R195 Other fecal abnormalities: Secondary | ICD-10-CM

## 2024-04-26 NOTE — Telephone Encounter (Signed)
 TCT patient regarding recent lab results.  Spoke with her. Advised  that her ferritin levels look good and she does not require an IV iron  infusion at this time.  We will plan to see her back in 3 months for labs in 6 months for clinic  visits as scheduled. Pt voiced understanding and is aware of her future appts.

## 2024-04-26 NOTE — Progress Notes (Signed)
 "  Chief Complaint:    Diarrhea, change in bowel habits  GI History: 74 year old female with multiple medical problems to include history of carotid artery atherosclerosis, aortic atherosclerosis, mild aortic stenosis, HTN, diabetes, fibromyalgia, iron  deficiency anemia, gastric sleeve, CKD, NASH, CHFpEF 55-60%, obesity, obstructive sleep apnea, obesity hypoventilation syndrome, neutropenia/thrombocytopenia, follows in GI clinic for the following:  1) NASH - 11/2021: Liver biopsy: Mildly active steatohepatitis (grade 1 of 3), portal fibrosis with focally bridging fibrosis (stage II-III of 4) - 10/2023: Ultrasound elastography: Median kPa 2.9  2) Pancreatic cyst.  Pancreatic cyst initially found incidentally on CT A/P in 05/2018.  Subsequent MRI abdomen in 05/2018 with 3.4 x 2.8 cm multilocular cyst in the [facility] which has been monitored with serial imaging, including MRI 06/2019, 07/2021, 10/2023, with most recent showing a large, complex multiseptated cystic lesion measuring 3.5 x 2.5 x 2.7 cm.  Endoscopic History: 09/10/19 EGD - Asymptomatic thin Schatzki' s ring. - Typical anatomy s/ p remote gastric sleeve operation. - The examined duodenum was normal.  09/10/19 colonoscopy, recall 3 years - Five 2 to 7 mm polyps in the descending colon, in the transverse colon and in the cecum, removed with a cold snare. Resected and retrieved. - One 12 mm polyp in the transverse colon, removed with a hot snare. Resected and retrieved. - Diverticulosis in the left colon. - The examination was otherwise normal on direct and retroflexion views. Path: Diagnosis Surgical [P], colon, descending, transverse-3, and cecum, polyp (5) - TUBULAR ADENOMA (X5). - NEGATIVE FOR HIGH GRADE DYSPLASIA.   10/17/2008 colonoscopy 1) Normal colon 2) No polyps or cancers  HPI:     Patient is a 74 y.o. female presenting to the Gastroenterology Clinic for evaluation of change in bowel habits and diarrhea.  She was last seen in GI  clinic on 10/26/2023 for routine follow-up of NASH.  Was having issues with edema at that time and referred back to the Cardiology Clinic.  Now, main concern is postprandial diarrhea and fecal urgency with loose, watery, nonbloody stools (previous baseline was 1 BM/week for her whole life).  Sxs started a few months ago as intermittent, and now more frequent. Symptoms typically last up to 3 hours after eating. Independent of food types. Has had fecal incontinence.  Does improve with Imodium.  She is afraid to go out due to concern about having diarrhea while out about.   Had only been taking 1 Creon  capsule with meals, but increased to 2 capsules this week.   -11/15/2023: MRI/MRCP for pancreatic cyst surveillance: Large, complex multiseptated cystic lesion in the superior pancreatic head measuring 3.5 x 2.5 x 2.7 cm, very slightly enlarged compared with prior exams dating back to 2021 (3.3 x 3.1 x 2.1 cm).  May reflect large, complex IPMN, alternatively a serous cystadenoma.  Consider EUS FNA  Was seen in the Cardiology Clinic yesterday for SOB x 6 months.  Increased furosemide , referred to the Pulmonary Clinic, with plan for follow-up in 1 month with repeat echocardiogram.  Was also seen in the Hematology/Oncology clinic yesterday for IDA and chronic thrombocytopenia/leukopenia  Review of systems:     No chest pain, no SOB, no fevers, no urinary sx   Past Medical History:  Diagnosis Date   Anemia    Arthritis    Asthma    OCCAS   Bell's palsy    Broken neck (HCC)    C-1   Cancer (HCC)    left ear squamus cell   Carbuncle and furuncle  of trunk    Chronic dislocation of right shoulder    Depression    Disorder of fascia    HX OF NECROTIC FASCITIS AFTER ABDOMINAL SURGERY FOR HERNIA- REQUIRED 19 SURGERIES AND 2.5 MONTH HOSPITALIZATION AT [facility]   DM (diabetes mellitus) (HCC)    Dysrhythmia    HX OF TACHYCARDIA AND BRADYCARDIA - ON GOING FOR YEARS - DOES NOT HAVE TO SEE CARDIOLOGIST    Elevated cholesterol    Fibromyalgia    Fracture 07/2012   HX OF FRACTURED NECK C1- CAUSES SEVERE HEADACHES--LIMITED ROM NECK   Frequent infections    ESPECIALLY PRONE TO INFECTIONS AFTER SURGERIES   Graves disease    Heart murmur    DOES NOT CAUSE ANY PROBLEMS   History of kidney stones    Hyperlipidemia    Hypertension    Hypothyroidism    GRAVES DISEASE   Migraine    Nephrolithiasis    STAGE 3    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   Neuropathy    Obesity    OSA (obstructive sleep apnea)    USES CPAP - DOES NOT KNOW SETTING   Pain    LEFT SHOULDER  PAIN -HARD TO LIE ON LEFT SIDE FOR LONG PERIOD;  PAIN IN LOWER BADK - 3 HERNIATED DISCS AND STENOISIS -   Pneumonia    PONV (postoperative nausea and vomiting)    THE GAS MAKES ME NAUSEATED   Restless leg syndrome    Sciatica    Tachycardia    Urinary frequency    Urticaria    UTI (urinary tract infection)     Patient's surgical history, family medical history, social history, medications and allergies were all reviewed in Epic    Current Outpatient Medications  Medication Sig Dispense Refill   amLODipine  (NORVASC ) 5 MG tablet Take 5 mg by mouth at bedtime.     BD INSULIN  SYRINGE U/F 31G X 5/16 0.5 ML MISC USE 5 TIMES A DAY LEVEMIR  AND NOVOLOG      BD PEN NEEDLE MICRO U/F 32G X 6 MM MISC Inject 32 pens into the skin daily.     Bempedoic Acid-Ezetimibe  (NEXLIZET ) 180-10 MG TABS Take 1 tablet by mouth daily. 30 tablet 11   Cholecalciferol  (DIALYVITE VITAMIN D 5000) 125 MCG (5000 UT) capsule Take 5,000 Units by mouth daily.     clonazePAM  (KLONOPIN ) 0.5 MG tablet Take 0.5 mg by mouth See admin instructions. Take 0.5 mg at bedtime, may take an additional 0.5 mg tablet twice daily as needed for anxiety  0   CREON  36000-114000 units CPEP capsule Take 36,000 Units by mouth 3 (three) times daily with meals.     fenofibrate  54 MG tablet Take 54 mg by mouth daily with supper.  2   FLUoxetine  (PROZAC ) 20 MG capsule Take 40 mg by mouth at  bedtime.  3   folic acid  (FOLVITE ) 1 MG tablet Take 1 mg by mouth daily.     furosemide  (LASIX ) 40 MG tablet Take 40 mg by mouth daily.     gabapentin  (NEURONTIN ) 300 MG capsule Take 1 capsule (300 mg total) by mouth 3 (three) times daily for 30 days. 90 capsule 0   insulin  aspart (NOVOLOG ) 100 UNIT/ML FlexPen Sliding scale dose: CBG < 70: implement hypoglycemia protocol  CBG 70 - 120: 0 units  CBG 121 - 150: 0 units  CBG 151 - 200: 0 units  CBG 201 - 250: 2 units  CBG 251 - 300: 3 units  CBG 301 -  350: 4 units  CBG 351 - 400: 5 units (Patient taking differently: Inject 20-25 Units into the skin 2 (two) times daily as needed for high blood sugar.) 15 mL 0   Insulin  Glargine (BASAGLAR  KWIKPEN) 100 UNIT/ML Inject 22 Units into the skin 2 (two) times daily.     losartan  (COZAAR ) 25 MG tablet Take 25 mg by mouth.     metoprolol  tartrate (LOPRESSOR ) 50 MG tablet Take 50 mg by mouth 2 (two) times daily.     NON FORMULARY Pt uses a c-pap nightly     nutrition supplement, JUVEN, (JUVEN) PACK Take 1 packet by mouth 2 (two) times daily between meals.     omeprazole (PRILOSEC) 20 MG capsule Take 20 mg by mouth daily with supper.      ondansetron  (ZOFRAN -ODT) 4 MG disintegrating tablet Take 1 tablet (4 mg total) by mouth every 6 (six) hours as needed for nausea or vomiting. 30 tablet 0   oxybutynin  (DITROPAN -XL) 10 MG 24 hr tablet Take 10 mg by mouth daily.     propylthiouracil  (PTU) 50 MG tablet Take 50 mg by mouth daily.  0   rOPINIRole  (REQUIP ) 2 MG tablet Take 2-3 mg by mouth See admin instructions. Take 2 mg by mouth in the morning and take 3 mg in the evening     tiZANidine  (ZANAFLEX ) 4 MG tablet Take 1 tablet (4 mg total) by mouth at bedtime. 30 tablet 0   VENTOLIN  HFA 108 (90 Base) MCG/ACT inhaler Inhale 2 puffs into the lungs every 4 (four) hours as needed for shortness of breath.     No current facility-administered medications for this visit.    Physical Exam:     BP 122/74   Pulse 65    Ht 5' (1.524 m)   Wt 229 lb (103.9 kg)   SpO2 96%   BMI 44.72 kg/m   GENERAL:  Pleasant female in NAD PSYCH: : Cooperative, normal affect ABDOMEN:  Nondistended, soft, nontender.  SKIN:  turgor, no lesions seen Musculoskeletal:  Normal muscle tone, normal strength NEURO: Alert and oriented x 3, no focal neurologic deficits   IMPRESSION and PLAN:    1) Diarrhea 2) Change in bowel habits 3) Fecal urgency 74 year old female with newer onset change in bowel habits with diarrhea, fecal urgency, and episodes of fecal incontinence.  Discussed broad DDx with her at length today, to include pancreatic insufficiency (has been on Creon  for a few years, but possibly being underdosed), bowel wall edema (either related to CHF or ARB), infection, inflammation, microscopic colitis, etc. with plan for the following:  - Pancreatic fecal elastase.  She is already taking Creon , but possibly this is underdosed, but nonetheless interpret results with caution - Fecal calprotectin, GI PCR panel via [employer] - Discussed the role/utility of colonoscopy for diagnostic intent.  Given her significant underlying comorbidities, she feels that the risks outweigh the benefits currently, and I agree.  Plan for noninvasive diagnostics for the time being - If results are nondiagnostic and continued symptoms, plan for CT abdomen/pelvis to evaluate for bowel wall edema or other intra-abdominal pathology - Increase Creon  to 2 capsules with meals and 1 with snacks  4) Pancreatic cyst History of a large, complex multiseptated pancreatic cyst which has been monitored on serial imaging for the last 5+ years.  Overall largely stable in size.  No prior EUS.  Discussed EUS with possible FNA with her at length today.  Similar to the above, feels that the risks outweigh the benefits  particular with her underlying cardiopulmonary disease and body habitus, and I tend to agree.  Consider repeat MRI in 10/2025 for continued surveillance  and will hold off on EUS for the time being  5) History of colon polyps - As outlined above, holding off on colonoscopy for polyp surveillance given underlying comorbidities.  If/when endoscopic procedures, these would need to be done in the hospital endoscopy unit due to elevated periprocedural risks  RTC in 3 months or sooner prn      I spent 45 minutes of time, including in depth chart review, independent review of results as outlined above, communicating results with the patient directly, face-to-face time with the patient, coordinating care, ordering studies and medications as appropriate, and documentation.       Sandor GAILS Marvens Hollars ,DO, FACG 04/26/2024, 10:07 AM  "

## 2024-04-26 NOTE — Telephone Encounter (Signed)
-----   Message from Norleen Kidney, MD sent at 04/26/2024  4:43 PM EST ----- Please let [name] know that her ferritin levels look good and she does not require an IV iron  infusion at this time.  We will plan to see her back in 3 months for labs in 6 months for clinic  visits as scheduled.

## 2024-04-26 NOTE — Patient Instructions (Addendum)
 _______________________________________________________  If your blood pressure at your visit was 140/90 or greater, please contact your primary care physician to follow up on this.  _______________________________________________________  If you are age 72 or older, your body mass index should be between 23-30. Your Body mass index is 44.72 kg/m. If this is out of the aforementioned range listed, please consider follow up with your Primary Care Provider.  If you are age 68 or younger, your body mass index should be between 19-25. Your Body mass index is 44.72 kg/m. If this is out of the aformentioned range listed, please consider follow up with your Primary Care Provider.   ________________________________________________________  The [place] [facility] providers would like to encourage you to use MYCHART to communicate with providers for non-urgent requests or questions.  Due to long hold times on the telephone, sending your provider a message by [facility] may be a faster and more efficient way to get a response.  Please allow 48 business hours for a response.  Please remember that this is for non-urgent requests.  _______________________________________________________  [facility] is using a team-based approach to care.  Your team is made up of your doctor and two to three APPS. Our APPS (Nurse Practitioners and Physician Assistants) work with your physician to ensure care continuity for you. They are fully qualified to address your health concerns and develop a treatment plan. They communicate directly with your gastroenterologist to care for you. Seeing the Advanced Practice Practitioners on your physician's team can help you by facilitating care more promptly, often allowing for earlier appointments, access to diagnostic testing, procedures, and other specialty referrals.   Your provider has requested that you go to the basement level for lab work before leaving today. Press B on the  elevator. The lab is located at the first door on the left as you exit the elevator.  Your provider has ordered [employer] stool testing for you. You have received a kit from our office today containing all necessary supplies to complete this test. Please carefully read the stool collection instructions provided in the kit before opening the accompanying materials. In addition, be sure there is a label providing your full name and date of birth on the puritan opti-swab tube that is supplied in the kit (if you do not see a label with this information on your test tube, please make us  aware before test collection!). After completing the test, you should secure the purtian tube into the specimen biohazard bag. The [facility] E-Req sheet (including date and time of specimen collection) should be placed into the outside pocket of the specimen biohazard bag and returned to the [place] lab (basement floor of [facility]) within 3 days of collection. Please make sure to give the specimen to a staff member at the lab. DO NOT leave the specimen on the counter.   If the specimen date and time (can be found in the upper right boxed portion of the sheet) are not filled out on the E-Req sheet, the test will NOT be performed.   Due to recent changes in healthcare laws, you may see the results of your imaging and laboratory studies on MyChart before your provider has had a chance to review them.  We understand that in some cases there may be results that are confusing or concerning to you. Not all laboratory results come back in the same time frame and the provider may be waiting for multiple results in order to interpret others.  Please  give us  48 hours in order for your provider to thoroughly review all the results before contacting the office for clarification of your results.   It was a pleasure to see you today!  Vito Cirigliano, D.O.

## 2024-04-27 ENCOUNTER — Ambulatory Visit: Admitting: Pulmonary Disease

## 2024-04-27 ENCOUNTER — Encounter: Payer: Self-pay | Admitting: Pulmonary Disease

## 2024-04-27 VITALS — BP 131/65 | HR 58 | Wt 229.0 lb

## 2024-04-27 DIAGNOSIS — G4733 Obstructive sleep apnea (adult) (pediatric): Secondary | ICD-10-CM
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
## 2024-04-27 NOTE — Patient Instructions (Addendum)
 Schedule for overnight oximetry with CPAP in place  Scheduled for pulmonary function test  Graded activities as tolerated  Continue using current inhalers  Make sure to continue using your CPAP regularly Make sure you follow-up with the echocardiogram and possible right heart catheterization to make sure you do not have significant pulmonary hypertension  Follow-up in about 2 months

## 2024-05-24 ENCOUNTER — Encounter

## 2024-05-29 ENCOUNTER — Ambulatory Visit: Admitting: Pulmonary Disease

## 2024-06-04 ENCOUNTER — Ambulatory Visit (HOSPITAL_COMMUNITY)

## 2024-06-22 ENCOUNTER — Ambulatory Visit: Admitting: Emergency Medicine

## 2024-07-23 ENCOUNTER — Inpatient Hospital Stay: Attending: Hematology and Oncology

## 2024-10-24 ENCOUNTER — Inpatient Hospital Stay: Attending: Hematology and Oncology

## 2024-10-24 ENCOUNTER — Inpatient Hospital Stay: Admitting: Hematology and Oncology

## 2024-11-13 ENCOUNTER — Telehealth: Admitting: Adult Health
